# Patient Record
Sex: Female | Born: 1960 | Race: White | Hispanic: No | Smoking: Current every day smoker
Health system: Southern US, Community
[De-identification: ages and names within clinical notes are randomized; demographics above are authoritative.]

## PROBLEM LIST (undated history)

## (undated) DIAGNOSIS — I499 Cardiac arrhythmia, unspecified: Secondary | ICD-10-CM

## (undated) DIAGNOSIS — F419 Anxiety disorder, unspecified: Secondary | ICD-10-CM

## (undated) DIAGNOSIS — Z972 Presence of dental prosthetic device (complete) (partial): Secondary | ICD-10-CM

## (undated) DIAGNOSIS — R519 Headache, unspecified: Secondary | ICD-10-CM

## (undated) DIAGNOSIS — E119 Type 2 diabetes mellitus without complications: Secondary | ICD-10-CM

## (undated) DIAGNOSIS — J45909 Unspecified asthma, uncomplicated: Secondary | ICD-10-CM

## (undated) DIAGNOSIS — R0600 Dyspnea, unspecified: Secondary | ICD-10-CM

## (undated) DIAGNOSIS — G709 Myoneural disorder, unspecified: Secondary | ICD-10-CM

## (undated) DIAGNOSIS — H7192 Unspecified cholesteatoma, left ear: Secondary | ICD-10-CM

## (undated) DIAGNOSIS — G250 Essential tremor: Secondary | ICD-10-CM

## (undated) DIAGNOSIS — J449 Chronic obstructive pulmonary disease, unspecified: Secondary | ICD-10-CM

## (undated) DIAGNOSIS — M1990 Unspecified osteoarthritis, unspecified site: Secondary | ICD-10-CM

## (undated) DIAGNOSIS — R05 Cough: Secondary | ICD-10-CM

## (undated) DIAGNOSIS — I5189 Other ill-defined heart diseases: Secondary | ICD-10-CM

## (undated) DIAGNOSIS — E785 Hyperlipidemia, unspecified: Secondary | ICD-10-CM

## (undated) DIAGNOSIS — K219 Gastro-esophageal reflux disease without esophagitis: Secondary | ICD-10-CM

## (undated) DIAGNOSIS — H9190 Unspecified hearing loss, unspecified ear: Secondary | ICD-10-CM

## (undated) DIAGNOSIS — F329 Major depressive disorder, single episode, unspecified: Secondary | ICD-10-CM

## (undated) DIAGNOSIS — I10 Essential (primary) hypertension: Secondary | ICD-10-CM

## (undated) DIAGNOSIS — G243 Spasmodic torticollis: Secondary | ICD-10-CM

## (undated) DIAGNOSIS — R059 Cough, unspecified: Secondary | ICD-10-CM

## (undated) DIAGNOSIS — F32A Depression, unspecified: Secondary | ICD-10-CM

## (undated) DIAGNOSIS — T753XXA Motion sickness, initial encounter: Secondary | ICD-10-CM

## (undated) DIAGNOSIS — R51 Headache: Secondary | ICD-10-CM

## (undated) HISTORY — PX: EXTERNAL EAR SURGERY: SHX627

## (undated) HISTORY — DX: Anxiety disorder, unspecified: F41.9

## (undated) HISTORY — PX: CARPAL TUNNEL RELEASE: SHX101

## (undated) HISTORY — DX: Depression, unspecified: F32.A

## (undated) HISTORY — PX: COLONOSCOPY: SHX174

## (undated) HISTORY — DX: Hyperlipidemia, unspecified: E78.5

## (undated) HISTORY — DX: Essential (primary) hypertension: I10

## (undated) HISTORY — DX: Major depressive disorder, single episode, unspecified: F32.9

## (undated) HISTORY — DX: Unspecified asthma, uncomplicated: J45.909

## (undated) HISTORY — DX: Unspecified cholesteatoma, left ear: H71.92

## (undated) HISTORY — DX: Type 2 diabetes mellitus without complications: E11.9

## (undated) HISTORY — DX: Other ill-defined heart diseases: I51.89

## (undated) HISTORY — PX: DILATION AND CURETTAGE OF UTERUS: SHX78

## (undated) HISTORY — PX: TUBAL LIGATION: SHX77

## (undated) HISTORY — PX: SPINE SURGERY: SHX786

---

## 2004-01-07 ENCOUNTER — Emergency Department: Payer: Self-pay | Admitting: Emergency Medicine

## 2007-08-19 ENCOUNTER — Ambulatory Visit: Payer: Self-pay | Admitting: Gastroenterology

## 2007-08-26 ENCOUNTER — Ambulatory Visit: Payer: Self-pay | Admitting: Gastroenterology

## 2007-09-15 ENCOUNTER — Ambulatory Visit: Payer: Self-pay | Admitting: Specialist

## 2008-03-31 ENCOUNTER — Emergency Department: Payer: Self-pay | Admitting: Emergency Medicine

## 2008-04-04 ENCOUNTER — Emergency Department: Payer: Self-pay | Admitting: Emergency Medicine

## 2008-12-25 ENCOUNTER — Emergency Department: Payer: Self-pay | Admitting: Emergency Medicine

## 2009-03-01 ENCOUNTER — Emergency Department: Payer: Self-pay | Admitting: Emergency Medicine

## 2009-05-06 ENCOUNTER — Ambulatory Visit: Payer: Self-pay | Admitting: Rheumatology

## 2009-08-08 ENCOUNTER — Encounter: Admission: RE | Admit: 2009-08-08 | Discharge: 2009-08-08 | Payer: Self-pay | Admitting: Neurosurgery

## 2010-07-27 ENCOUNTER — Encounter: Payer: Self-pay | Admitting: Anesthesiology

## 2011-05-25 ENCOUNTER — Ambulatory Visit: Payer: Self-pay | Admitting: Neurology

## 2011-06-04 ENCOUNTER — Emergency Department: Payer: Self-pay | Admitting: Emergency Medicine

## 2011-07-12 ENCOUNTER — Ambulatory Visit: Payer: Self-pay | Admitting: Family Medicine

## 2011-10-12 ENCOUNTER — Emergency Department: Payer: Self-pay | Admitting: Unknown Physician Specialty

## 2011-10-20 ENCOUNTER — Emergency Department: Payer: Self-pay | Admitting: Emergency Medicine

## 2011-10-20 LAB — URIC ACID: Uric Acid: 4.7 mg/dL (ref 2.6–6.0)

## 2011-11-01 ENCOUNTER — Ambulatory Visit: Payer: Self-pay

## 2011-11-15 ENCOUNTER — Other Ambulatory Visit: Payer: Self-pay | Admitting: Rheumatology

## 2011-11-15 LAB — SYNOVIAL CELL COUNT + DIFF, W/ CRYSTALS
Basophil: 0 %
Crystals, Joint Fluid: NONE SEEN
Eosinophil: 0 %
Lymphocytes: 61 %
Neutrophils: 23 %
Nucleated Cell Count: 648 /mm3
Other Cells BF: 0 %
Other Mononuclear Cells: 16 %

## 2011-12-05 ENCOUNTER — Ambulatory Visit: Payer: Self-pay | Admitting: Rheumatology

## 2012-02-05 ENCOUNTER — Emergency Department: Payer: Self-pay | Admitting: Emergency Medicine

## 2012-05-22 ENCOUNTER — Ambulatory Visit: Payer: Self-pay | Admitting: Family Medicine

## 2012-06-30 ENCOUNTER — Ambulatory Visit: Payer: Self-pay | Admitting: Family Medicine

## 2012-07-01 ENCOUNTER — Inpatient Hospital Stay: Payer: Self-pay | Admitting: Specialist

## 2012-07-01 LAB — CBC WITH DIFFERENTIAL/PLATELET
Basophil #: 0 10*3/uL (ref 0.0–0.1)
Basophil %: 0.4 %
Eosinophil #: 0.1 10*3/uL (ref 0.0–0.7)
Eosinophil %: 1.6 %
HCT: 31.4 % — ABNORMAL LOW (ref 35.0–47.0)
HGB: 11.3 g/dL — ABNORMAL LOW (ref 12.0–16.0)
Lymphocyte #: 1.1 10*3/uL (ref 1.0–3.6)
Lymphocyte %: 18.9 %
MCH: 32 pg (ref 26.0–34.0)
MCHC: 35.9 g/dL (ref 32.0–36.0)
MCV: 89 fL (ref 80–100)
Monocyte #: 0.4 x10 3/mm (ref 0.2–0.9)
Monocyte %: 7 %
Neutrophil #: 4.3 10*3/uL (ref 1.4–6.5)
Neutrophil %: 72.1 %
Platelet: 290 10*3/uL (ref 150–440)
RBC: 3.52 10*6/uL — ABNORMAL LOW (ref 3.80–5.20)
RDW: 12.9 % (ref 11.5–14.5)
WBC: 5.9 10*3/uL (ref 3.6–11.0)

## 2012-07-01 LAB — COMPREHENSIVE METABOLIC PANEL
Albumin: 2.9 g/dL — ABNORMAL LOW (ref 3.4–5.0)
Alkaline Phosphatase: 82 U/L (ref 50–136)
Anion Gap: 6 — ABNORMAL LOW (ref 7–16)
BUN: 7 mg/dL (ref 7–18)
Bilirubin,Total: 0.2 mg/dL (ref 0.2–1.0)
Calcium, Total: 8.9 mg/dL (ref 8.5–10.1)
Chloride: 86 mmol/L — ABNORMAL LOW (ref 98–107)
Co2: 32 mmol/L (ref 21–32)
Creatinine: 0.65 mg/dL (ref 0.60–1.30)
EGFR (African American): >60
EGFR (Non-African Amer.): >60
Glucose: 177 mg/dL — ABNORMAL HIGH (ref 65–99)
Osmolality: 252 (ref 275–301)
Potassium: 3.9 mmol/L (ref 3.5–5.1)
SGOT(AST): 20 U/L (ref 15–37)
SGPT (ALT): 13 U/L (ref 12–78)
Sodium: 124 mmol/L — ABNORMAL LOW (ref 136–145)
Total Protein: 7.2 g/dL (ref 6.4–8.2)

## 2012-07-01 LAB — URIC ACID: Uric Acid: 4.8 mg/dL (ref 2.6–6.0)

## 2012-07-01 LAB — SEDIMENTATION RATE: Erythrocyte Sed Rate: 135 mm/hr — ABNORMAL HIGH (ref 0–30)

## 2012-07-02 LAB — BASIC METABOLIC PANEL
Anion Gap: 7 (ref 7–16)
BUN: 6 mg/dL — ABNORMAL LOW (ref 7–18)
Calcium, Total: 8.3 mg/dL — ABNORMAL LOW (ref 8.5–10.1)
Chloride: 89 mmol/L — ABNORMAL LOW (ref 98–107)
Co2: 31 mmol/L (ref 21–32)
Creatinine: 0.68 mg/dL (ref 0.60–1.30)
EGFR (African American): >60
EGFR (Non-African Amer.): >60
Glucose: 128 mg/dL — ABNORMAL HIGH (ref 65–99)
Osmolality: 254 (ref 275–301)
Potassium: 3.3 mmol/L — ABNORMAL LOW (ref 3.5–5.1)
Sodium: 127 mmol/L — ABNORMAL LOW (ref 136–145)

## 2012-07-02 LAB — CBC WITH DIFFERENTIAL/PLATELET
Basophil #: 0 10*3/uL (ref 0.0–0.1)
Basophil %: 0.4 %
Eosinophil #: 0.1 10*3/uL (ref 0.0–0.7)
Eosinophil %: 2.6 %
HCT: 28.9 % — ABNORMAL LOW (ref 35.0–47.0)
HGB: 10.4 g/dL — ABNORMAL LOW (ref 12.0–16.0)
Lymphocyte #: 1.1 10*3/uL (ref 1.0–3.6)
Lymphocyte %: 27.8 %
MCH: 31.9 pg (ref 26.0–34.0)
MCHC: 36.1 g/dL — ABNORMAL HIGH (ref 32.0–36.0)
MCV: 88 fL (ref 80–100)
Monocyte #: 0.4 x10 3/mm (ref 0.2–0.9)
Monocyte %: 10.7 %
Neutrophil #: 2.4 10*3/uL (ref 1.4–6.5)
Neutrophil %: 58.5 %
Platelet: 283 10*3/uL (ref 150–440)
RBC: 3.26 10*6/uL — ABNORMAL LOW (ref 3.80–5.20)
RDW: 12.7 % (ref 11.5–14.5)
WBC: 4.1 10*3/uL (ref 3.6–11.0)

## 2012-07-02 LAB — SODIUM, URINE, RANDOM: Sodium, Urine Random: 8 mmol/L (ref 20–110)

## 2012-07-02 LAB — HEMOGLOBIN A1C: Hemoglobin A1C: 7.1 % — ABNORMAL HIGH (ref 4.2–6.3)

## 2012-07-03 LAB — CBC WITH DIFFERENTIAL/PLATELET
Basophil #: 0 10*3/uL (ref 0.0–0.1)
Basophil %: 0.3 %
Eosinophil #: 0 10*3/uL (ref 0.0–0.7)
Eosinophil %: 0.1 %
HCT: 29.5 % — ABNORMAL LOW (ref 35.0–47.0)
HGB: 10.3 g/dL — ABNORMAL LOW (ref 12.0–16.0)
Lymphocyte #: 0.8 10*3/uL — ABNORMAL LOW (ref 1.0–3.6)
Lymphocyte %: 14.6 %
MCH: 31.3 pg (ref 26.0–34.0)
MCHC: 35 g/dL (ref 32.0–36.0)
MCV: 90 fL (ref 80–100)
Monocyte #: 0.3 x10 3/mm (ref 0.2–0.9)
Monocyte %: 5.9 %
Neutrophil #: 4.3 10*3/uL (ref 1.4–6.5)
Neutrophil %: 79.1 %
Platelet: 319 10*3/uL (ref 150–440)
RBC: 3.3 10*6/uL — ABNORMAL LOW (ref 3.80–5.20)
RDW: 13 % (ref 11.5–14.5)
WBC: 5.5 10*3/uL (ref 3.6–11.0)

## 2012-07-03 LAB — BASIC METABOLIC PANEL
Anion Gap: 4 — ABNORMAL LOW (ref 7–16)
BUN: 6 mg/dL — ABNORMAL LOW (ref 7–18)
Calcium, Total: 8.5 mg/dL (ref 8.5–10.1)
Chloride: 97 mmol/L — ABNORMAL LOW (ref 98–107)
Co2: 29 mmol/L (ref 21–32)
Creatinine: 0.53 mg/dL — ABNORMAL LOW (ref 0.60–1.30)
EGFR (African American): >60
EGFR (Non-African Amer.): >60
Glucose: 150 mg/dL — ABNORMAL HIGH (ref 65–99)
Osmolality: 261 (ref 275–301)
Potassium: 4.1 mmol/L (ref 3.5–5.1)
Sodium: 130 mmol/L — ABNORMAL LOW (ref 136–145)

## 2012-07-04 LAB — BASIC METABOLIC PANEL
Anion Gap: 6 — ABNORMAL LOW (ref 7–16)
BUN: 9 mg/dL (ref 7–18)
Calcium, Total: 8.5 mg/dL (ref 8.5–10.1)
Chloride: 95 mmol/L — ABNORMAL LOW (ref 98–107)
Co2: 29 mmol/L (ref 21–32)
Creatinine: 0.7 mg/dL (ref 0.60–1.30)
EGFR (African American): >60
EGFR (Non-African Amer.): >60
Glucose: 100 mg/dL — ABNORMAL HIGH (ref 65–99)
Osmolality: 260 (ref 275–301)
Potassium: 3.6 mmol/L (ref 3.5–5.1)
Sodium: 130 mmol/L — ABNORMAL LOW (ref 136–145)

## 2012-07-07 LAB — CULTURE, BLOOD (SINGLE)

## 2012-07-17 ENCOUNTER — Ambulatory Visit: Payer: Self-pay | Admitting: Specialist

## 2012-08-04 ENCOUNTER — Emergency Department: Payer: Self-pay | Admitting: Emergency Medicine

## 2012-08-04 LAB — COMPREHENSIVE METABOLIC PANEL
Albumin: 3.6 g/dL (ref 3.4–5.0)
Alkaline Phosphatase: 95 U/L (ref 50–136)
Anion Gap: 7 (ref 7–16)
BUN: 6 mg/dL — ABNORMAL LOW (ref 7–18)
Bilirubin,Total: 0.3 mg/dL (ref 0.2–1.0)
Calcium, Total: 9 mg/dL (ref 8.5–10.1)
Chloride: 96 mmol/L — ABNORMAL LOW (ref 98–107)
Co2: 27 mmol/L (ref 21–32)
Creatinine: 0.84 mg/dL (ref 0.60–1.30)
EGFR (African American): >60
EGFR (Non-African Amer.): >60
Glucose: 187 mg/dL — ABNORMAL HIGH (ref 65–99)
Osmolality: 263 (ref 275–301)
Potassium: 3.5 mmol/L (ref 3.5–5.1)
SGOT(AST): 11 U/L — ABNORMAL LOW (ref 15–37)
SGPT (ALT): 14 U/L (ref 12–78)
Sodium: 130 mmol/L — ABNORMAL LOW (ref 136–145)
Total Protein: 7.4 g/dL (ref 6.4–8.2)

## 2012-08-04 LAB — CBC
HCT: 37.6 % (ref 35.0–47.0)
HGB: 13.1 g/dL (ref 12.0–16.0)
MCH: 32.2 pg (ref 26.0–34.0)
MCHC: 34.7 g/dL (ref 32.0–36.0)
MCV: 93 fL (ref 80–100)
Platelet: 257 10*3/uL (ref 150–440)
RBC: 4.06 10*6/uL (ref 3.80–5.20)
RDW: 14.2 % (ref 11.5–14.5)
WBC: 9.5 10*3/uL (ref 3.6–11.0)

## 2012-08-09 ENCOUNTER — Emergency Department: Payer: Self-pay | Admitting: Emergency Medicine

## 2012-08-17 ENCOUNTER — Emergency Department: Payer: Self-pay | Admitting: Emergency Medicine

## 2012-09-10 ENCOUNTER — Ambulatory Visit: Payer: Self-pay | Admitting: Family Medicine

## 2012-09-30 ENCOUNTER — Emergency Department: Payer: Self-pay | Admitting: Emergency Medicine

## 2012-09-30 LAB — COMPREHENSIVE METABOLIC PANEL
Albumin: 2.7 g/dL — ABNORMAL LOW (ref 3.4–5.0)
Alkaline Phosphatase: 89 U/L (ref 50–136)
Anion Gap: 8 (ref 7–16)
BUN: 4 mg/dL — ABNORMAL LOW (ref 7–18)
Bilirubin,Total: 0.3 mg/dL (ref 0.2–1.0)
Calcium, Total: 8.9 mg/dL (ref 8.5–10.1)
Chloride: 92 mmol/L — ABNORMAL LOW (ref 98–107)
Co2: 28 mmol/L (ref 21–32)
Creatinine: 0.69 mg/dL (ref 0.60–1.30)
EGFR (African American): >60
EGFR (Non-African Amer.): >60
Glucose: 163 mg/dL — ABNORMAL HIGH (ref 65–99)
Osmolality: 258 (ref 275–301)
Potassium: 4 mmol/L (ref 3.5–5.1)
SGOT(AST): 14 U/L — ABNORMAL LOW (ref 15–37)
SGPT (ALT): 8 U/L — ABNORMAL LOW (ref 12–78)
Sodium: 128 mmol/L — ABNORMAL LOW (ref 136–145)
Total Protein: 6.8 g/dL (ref 6.4–8.2)

## 2012-09-30 LAB — CBC WITH DIFFERENTIAL/PLATELET
Basophil #: 0 x10 3/mm 3
Basophil %: 0.3 %
Eosinophil #: 0 x10 3/mm 3
Eosinophil %: 0.4 %
HCT: 32.6 % — ABNORMAL LOW
HGB: 11.6 g/dL — ABNORMAL LOW
Lymphocyte %: 19.7 %
Lymphs Abs: 1.5 x10 3/mm 3
MCH: 31.8 pg
MCHC: 35.5 g/dL
MCV: 90 fL
Monocyte #: 0.5 "x10 3/mm "
Monocyte %: 6 %
Neutrophil #: 5.8 x10 3/mm 3
Neutrophil %: 73.6 %
Platelet: 336 x10 3/mm 3
RBC: 3.64 X10 6/mm 3 — ABNORMAL LOW
RDW: 13.5 %
WBC: 7.9 x10 3/mm 3

## 2012-09-30 LAB — URIC ACID: Uric Acid: 4.2 mg/dL

## 2012-10-03 ENCOUNTER — Emergency Department: Payer: Self-pay | Admitting: Emergency Medicine

## 2012-10-07 LAB — CBC
HCT: 36.2 % (ref 35.0–47.0)
HGB: 12.7 g/dL (ref 12.0–16.0)
MCH: 31.2 pg (ref 26.0–34.0)
MCHC: 35.2 g/dL (ref 32.0–36.0)
MCV: 89 fL (ref 80–100)
Platelet: 507 10*3/uL — ABNORMAL HIGH (ref 150–440)
RBC: 4.08 10*6/uL (ref 3.80–5.20)
RDW: 13.7 % (ref 11.5–14.5)
WBC: 10.1 10*3/uL (ref 3.6–11.0)

## 2012-10-07 LAB — COMPREHENSIVE METABOLIC PANEL
Albumin: 2.9 g/dL — ABNORMAL LOW (ref 3.4–5.0)
Alkaline Phosphatase: 106 U/L (ref 50–136)
Anion Gap: 6 — ABNORMAL LOW (ref 7–16)
BUN: 6 mg/dL — ABNORMAL LOW (ref 7–18)
Bilirubin,Total: 0.3 mg/dL (ref 0.2–1.0)
Calcium, Total: 9.1 mg/dL (ref 8.5–10.1)
Chloride: 96 mmol/L — ABNORMAL LOW (ref 98–107)
Co2: 32 mmol/L (ref 21–32)
Creatinine: 0.58 mg/dL — ABNORMAL LOW (ref 0.60–1.30)
EGFR (African American): >60
EGFR (Non-African Amer.): >60
Glucose: 131 mg/dL — ABNORMAL HIGH (ref 65–99)
Osmolality: 268 (ref 275–301)
Potassium: 3.6 mmol/L (ref 3.5–5.1)
SGOT(AST): 16 U/L (ref 15–37)
SGPT (ALT): 14 U/L (ref 12–78)
Sodium: 134 mmol/L — ABNORMAL LOW (ref 136–145)
Total Protein: 7.1 g/dL (ref 6.4–8.2)

## 2012-10-07 LAB — TROPONIN I: Troponin-I: <0.02 ng/mL

## 2012-10-08 ENCOUNTER — Inpatient Hospital Stay: Payer: Self-pay | Admitting: Internal Medicine

## 2012-10-08 LAB — URINALYSIS, COMPLETE
Bacteria: NONE SEEN
Bilirubin,UR: NEGATIVE
Glucose,UR: NEGATIVE mg/dL (ref 0–75)
Ketone: NEGATIVE
Leukocyte Esterase: NEGATIVE
Nitrite: NEGATIVE
Ph: 9 (ref 4.5–8.0)
Protein: 30
RBC,UR: 10 /HPF (ref 0–5)
Specific Gravity: 1.013 (ref 1.003–1.030)
Squamous Epithelial: 4
WBC UR: 1 /HPF (ref 0–5)

## 2012-10-09 LAB — LIPID PANEL
Cholesterol: 144 mg/dL (ref 0–200)
HDL Cholesterol: 25 mg/dL — ABNORMAL LOW (ref 40–60)
Ldl Cholesterol, Calc: 85 mg/dL (ref 0–100)
Triglycerides: 172 mg/dL (ref 0–200)
VLDL Cholesterol, Calc: 34 mg/dL (ref 5–40)

## 2012-10-09 LAB — BASIC METABOLIC PANEL
Anion Gap: 9 (ref 7–16)
BUN: 6 mg/dL — ABNORMAL LOW (ref 7–18)
Calcium, Total: 8.6 mg/dL (ref 8.5–10.1)
Chloride: 97 mmol/L — ABNORMAL LOW (ref 98–107)
Co2: 27 mmol/L (ref 21–32)
Creatinine: 0.6 mg/dL (ref 0.60–1.30)
EGFR (African American): >60
EGFR (Non-African Amer.): >60
Glucose: 106 mg/dL — ABNORMAL HIGH (ref 65–99)
Osmolality: 264 (ref 275–301)
Potassium: 3.3 mmol/L — ABNORMAL LOW (ref 3.5–5.1)
Sodium: 133 mmol/L — ABNORMAL LOW (ref 136–145)

## 2012-10-09 LAB — CBC WITH DIFFERENTIAL/PLATELET
Basophil #: 0 10*3/uL (ref 0.0–0.1)
Basophil %: 0.2 %
Eosinophil #: 0.2 10*3/uL (ref 0.0–0.7)
Eosinophil %: 2.5 %
HCT: 32.4 % — ABNORMAL LOW (ref 35.0–47.0)
HGB: 11.5 g/dL — ABNORMAL LOW (ref 12.0–16.0)
Lymphocyte #: 2 10*3/uL (ref 1.0–3.6)
Lymphocyte %: 27.4 %
MCH: 31.3 pg (ref 26.0–34.0)
MCHC: 35.5 g/dL (ref 32.0–36.0)
MCV: 88 fL (ref 80–100)
Monocyte #: 0.5 x10 3/mm (ref 0.2–0.9)
Monocyte %: 6.3 %
Neutrophil #: 4.6 10*3/uL (ref 1.4–6.5)
Neutrophil %: 63.6 %
Platelet: 366 10*3/uL (ref 150–440)
RBC: 3.68 10*6/uL — ABNORMAL LOW (ref 3.80–5.20)
RDW: 13.8 % (ref 11.5–14.5)
WBC: 7.2 10*3/uL (ref 3.6–11.0)

## 2012-10-09 LAB — TSH: Thyroid Stimulating Horm: 1.9 u[IU]/mL

## 2012-10-09 LAB — HEMOGLOBIN A1C: Hemoglobin A1C: 7.4 % — ABNORMAL HIGH (ref 4.2–6.3)

## 2012-10-10 LAB — BETA STREP CULTURE(ARMC)

## 2012-10-10 LAB — BASIC METABOLIC PANEL
Anion Gap: 9 (ref 7–16)
BUN: 7 mg/dL (ref 7–18)
Calcium, Total: 9 mg/dL (ref 8.5–10.1)
Chloride: 96 mmol/L — ABNORMAL LOW (ref 98–107)
Co2: 26 mmol/L (ref 21–32)
Creatinine: 0.61 mg/dL (ref 0.60–1.30)
EGFR (African American): >60
EGFR (Non-African Amer.): >60
Glucose: 162 mg/dL — ABNORMAL HIGH (ref 65–99)
Osmolality: 264 (ref 275–301)
Potassium: 3.8 mmol/L (ref 3.5–5.1)
Sodium: 131 mmol/L — ABNORMAL LOW (ref 136–145)

## 2012-10-30 ENCOUNTER — Ambulatory Visit: Payer: Self-pay | Admitting: Family Medicine

## 2012-11-05 ENCOUNTER — Ambulatory Visit: Payer: Self-pay | Admitting: Specialist

## 2012-11-06 LAB — PRO B NATRIURETIC PEPTIDE: B-Type Natriuretic Peptide: 31 pg/mL (ref 0–125)

## 2012-11-06 LAB — BASIC METABOLIC PANEL
Anion Gap: 10 (ref 7–16)
BUN: 4 mg/dL — ABNORMAL LOW (ref 7–18)
Calcium, Total: 8.2 mg/dL — ABNORMAL LOW (ref 8.5–10.1)
Chloride: 93 mmol/L — ABNORMAL LOW (ref 98–107)
Co2: 24 mmol/L (ref 21–32)
Creatinine: 0.94 mg/dL (ref 0.60–1.30)
EGFR (African American): >60
EGFR (Non-African Amer.): >60
Glucose: 215 mg/dL — ABNORMAL HIGH (ref 65–99)
Osmolality: 259 (ref 275–301)
Potassium: 3.8 mmol/L (ref 3.5–5.1)
Sodium: 127 mmol/L — ABNORMAL LOW (ref 136–145)

## 2012-11-06 LAB — CBC
HCT: 33.5 % — ABNORMAL LOW (ref 35.0–47.0)
HGB: 11.3 g/dL — ABNORMAL LOW (ref 12.0–16.0)
MCH: 30.4 pg (ref 26.0–34.0)
MCHC: 33.8 g/dL (ref 32.0–36.0)
MCV: 90 fL (ref 80–100)
Platelet: 319 10*3/uL (ref 150–440)
RBC: 3.73 10*6/uL — ABNORMAL LOW (ref 3.80–5.20)
RDW: 13.9 % (ref 11.5–14.5)
WBC: 9 10*3/uL (ref 3.6–11.0)

## 2012-11-07 ENCOUNTER — Inpatient Hospital Stay: Payer: Self-pay | Admitting: Internal Medicine

## 2012-11-07 LAB — URINALYSIS, COMPLETE
Bacteria: NONE SEEN
Bilirubin,UR: NEGATIVE
Glucose,UR: NEGATIVE mg/dL (ref 0–75)
Ketone: NEGATIVE
Nitrite: POSITIVE
Ph: 7 (ref 4.5–8.0)
Protein: NEGATIVE
RBC,UR: 6 /HPF (ref 0–5)
Specific Gravity: 1.005 (ref 1.003–1.030)
Squamous Epithelial: 1
WBC UR: 58 /HPF (ref 0–5)

## 2012-11-08 LAB — BASIC METABOLIC PANEL
Anion Gap: 3 — ABNORMAL LOW (ref 7–16)
BUN: 6 mg/dL — ABNORMAL LOW (ref 7–18)
Calcium, Total: 8.3 mg/dL — ABNORMAL LOW (ref 8.5–10.1)
Chloride: 100 mmol/L (ref 98–107)
Co2: 30 mmol/L (ref 21–32)
Creatinine: 0.71 mg/dL (ref 0.60–1.30)
EGFR (African American): >60
EGFR (Non-African Amer.): >60
Glucose: 90 mg/dL (ref 65–99)
Osmolality: 264 (ref 275–301)
Potassium: 3.9 mmol/L (ref 3.5–5.1)
Sodium: 133 mmol/L — ABNORMAL LOW (ref 136–145)

## 2012-11-08 LAB — CBC WITH DIFFERENTIAL/PLATELET
Basophil #: 0 10*3/uL (ref 0.0–0.1)
Basophil %: 0.8 %
Eosinophil #: 0.1 10*3/uL (ref 0.0–0.7)
Eosinophil %: 3.8 %
HCT: 30.4 % — ABNORMAL LOW (ref 35.0–47.0)
HGB: 10.4 g/dL — ABNORMAL LOW (ref 12.0–16.0)
Lymphocyte #: 0.9 10*3/uL — ABNORMAL LOW (ref 1.0–3.6)
Lymphocyte %: 29.7 %
MCH: 30.7 pg (ref 26.0–34.0)
MCHC: 34.1 g/dL (ref 32.0–36.0)
MCV: 90 fL (ref 80–100)
Monocyte #: 0.3 x10 3/mm (ref 0.2–0.9)
Monocyte %: 8 %
Neutrophil #: 1.8 10*3/uL (ref 1.4–6.5)
Neutrophil %: 57.7 %
Platelet: 226 10*3/uL (ref 150–440)
RBC: 3.38 10*6/uL — ABNORMAL LOW (ref 3.80–5.20)
RDW: 14.4 % (ref 11.5–14.5)
WBC: 3.2 10*3/uL — ABNORMAL LOW (ref 3.6–11.0)

## 2012-11-08 LAB — VANCOMYCIN, TROUGH: Vancomycin, Trough: 9 ug/mL — ABNORMAL LOW (ref 10–20)

## 2012-11-13 DIAGNOSIS — G8929 Other chronic pain: Secondary | ICD-10-CM | POA: Insufficient documentation

## 2012-11-16 DIAGNOSIS — I5032 Chronic diastolic (congestive) heart failure: Secondary | ICD-10-CM | POA: Insufficient documentation

## 2012-12-21 ENCOUNTER — Emergency Department: Payer: Self-pay | Admitting: Emergency Medicine

## 2012-12-30 ENCOUNTER — Ambulatory Visit: Payer: Self-pay | Admitting: Orthopedic Surgery

## 2013-02-23 ENCOUNTER — Ambulatory Visit: Payer: Self-pay | Admitting: Family Medicine

## 2013-04-09 ENCOUNTER — Ambulatory Visit: Payer: Self-pay | Admitting: Urgent Care

## 2013-10-02 DIAGNOSIS — G250 Essential tremor: Secondary | ICD-10-CM | POA: Insufficient documentation

## 2013-10-02 DIAGNOSIS — G243 Spasmodic torticollis: Secondary | ICD-10-CM | POA: Insufficient documentation

## 2013-10-20 ENCOUNTER — Ambulatory Visit: Payer: Self-pay | Admitting: Neurology

## 2013-11-16 ENCOUNTER — Inpatient Hospital Stay: Payer: Self-pay | Admitting: Internal Medicine

## 2013-11-16 LAB — BASIC METABOLIC PANEL
Anion Gap: 10 (ref 7–16)
BUN: 7 mg/dL (ref 7–18)
Calcium, Total: 7.9 mg/dL — ABNORMAL LOW (ref 8.5–10.1)
Chloride: 95 mmol/L — ABNORMAL LOW (ref 98–107)
Co2: 26 mmol/L (ref 21–32)
Creatinine: 0.78 mg/dL (ref 0.60–1.30)
EGFR (African American): >60
EGFR (Non-African Amer.): >60
Glucose: 277 mg/dL — ABNORMAL HIGH (ref 65–99)
Osmolality: 271 (ref 275–301)
Potassium: 4 mmol/L (ref 3.5–5.1)
Sodium: 131 mmol/L — ABNORMAL LOW (ref 136–145)

## 2013-11-16 LAB — CBC WITH DIFFERENTIAL/PLATELET
Basophil #: 0.1 10*3/uL (ref 0.0–0.1)
Basophil %: 0.5 %
Eosinophil #: 0.1 10*3/uL (ref 0.0–0.7)
Eosinophil %: 0.9 %
HCT: 40.9 % (ref 35.0–47.0)
HGB: 13.7 g/dL (ref 12.0–16.0)
Lymphocyte #: 2.7 10*3/uL (ref 1.0–3.6)
Lymphocyte %: 24.4 %
MCH: 31.4 pg (ref 26.0–34.0)
MCHC: 33.6 g/dL (ref 32.0–36.0)
MCV: 94 fL (ref 80–100)
Monocyte #: 0.6 x10 3/mm (ref 0.2–0.9)
Monocyte %: 5.5 %
Neutrophil #: 7.5 10*3/uL — ABNORMAL HIGH (ref 1.4–6.5)
Neutrophil %: 68.7 %
Platelet: 254 10*3/uL (ref 150–440)
RBC: 4.38 10*6/uL (ref 3.80–5.20)
RDW: 14 % (ref 11.5–14.5)
WBC: 11 10*3/uL (ref 3.6–11.0)

## 2013-11-21 LAB — CULTURE, BLOOD (SINGLE)

## 2014-06-11 NOTE — Op Note (Signed)
PATIENT NAME:  Tricia [name], Tricia [name] MR#:  [id number] DATE OF BIRTH:  [birth year]  DATE OF PROCEDURE:  07/03/2012  PREOPERATIVE DIAGNOSIS: Osteomyelitis left great toe.   POSTOPERATIVE DIAGNOSIS: Osteomyelitis left great toe.   PROCEDURE: Excision of infected bone soft tissue from the left great toe.   SURGEON: Albertine Patricia, DPM   ASSISTANT: None.   HISTORY OF PRESENT ILLNESS: The patient presented to the hospital and was admitted because of redness and cellulitis to the left great toe which was spreading on the dorsum of the foot and proximally from the great toe. Ulcer was present at the IPJ level of the great toe.  Subsequent MRI showed an accessory bone area that was likely involved with infection with the possibility of the IP joint being involved as well.   ANESTHESIA: IVA with local.   ESTIMATED BLOOD LOSS: Negligible.   HEMOSTASIS: None.   DESCRIPTION OF PROCEDURE: The patient was brought to the OR and placed on the OR table in the supine position. At this point after sedation and local anesthetic was achieved, the patient was prepped and draped in the usual sterile manner. Attention was directed to the plantar aspect of the toe where an 8 mm ulcer was noted. This was probed down to deeper tissue this time, and incision was made both distally and proximally to the ulceration. This was deepened down to the deep tendinous area. The long flexor tendon was identified and divided centrally. There was an accessory bone noted at the IP joint level. This was dissected from the tendon and removed, and this was also cultured and sent to pathology for evaluation. At this point, the proximal and distal phalanges were inspected. The bone seemed to be intact. Bone and cartilage seemed to be stable and showed no evidence of degenerative or infectious change at this time frame. The area was copiously irrigated. Stimulan rapid-set beads were utilized and laced with gentamicin. These were placed deep  to the tendon, and once 10 to 12 of those beads were placed in there, the tendon was repaired with 2-0 Vicryl simple interrupted sutures. More beads were placed in the wound at this time, and subcutaneous tissues and deep and superficial fascia layers were closed with 3-0 Vicryl simple interrupted sutures. Skin was then closed with 3-0 nylon simple interrupted sutures. At this time, the patient was dressed with a sterile compressive dressing consisting of Xeroform gauze, 4 x 4's, Kling and Kerlix. The patient tolerated the procedure and anesthesia well and left the OR to the recovery room with vital signs stable and neurovascular status intact.   ____________________________ Tricia [name], DPM mgt:cb D: 07/03/2012 15:13:52 ET T: 07/03/2012 16:09:59 ET JOB#: [id number]  cc: Tricia Heck Tricia [name], DPM, <Dictator> Perry Mount MD ELECTRONICALLY SIGNED 08/13/2012 15:22

## 2014-06-11 NOTE — Discharge Summary (Signed)
PATIENT NAME:  Tricia [name], Tricia [name] MR#:  [id number] DATE OF BIRTH:  [birth year]  DATE OF ADMISSION:  07/01/2012 DATE OF DISCHARGE:  07/04/2012  For a detailed note, please take a look at the history and physical done on admission by Dr. Laurin Coder.   DIAGNOSES AT DISCHARGE:  Are as follows:  1.  Acute left lower extremity cellulitis.  2.  History of gout. 3.  Diabetes. 4.  Chronic obstructive pulmonary disease.  5.  Hyperlipidemia. 6.  Anxiety/depression.  DIET:  The patient was discharged on a low sodium, low fat, American diabetic association diet.   ACTIVITY:  As tolerated.  FOLLOW-UP:  With Dr. Keith Rake in the next 1 to 2 weeks.   DISCHARGE MEDICATIONS:  Are as follows:  1.  Metformin 500 mg twice daily. 2.  Simvastatin 20 mg daily. 3.  Cymbalta 60 mg daily. 4.  Primidone 250 mg twice daily. 5.  Xanax 0.5 mg 3 times daily. 6.  Meloxicam 7.5 mg daily. 7.  DuoNebs q. 6 hours as needed. 8. Tylenol with hydrocodone 10/325 1 tab 4 times daily as needed. 9.  Singulair 10 mg daily. 10.  Spiriva 1 puff daily. 11.  Symbicort 2 puffs twice daily. 12.  Zantac 150 mg twice daily. 13.  Ventolin inhaler 2 puffs 4 times daily as needed. 14.  Maxalt 5 mg as needed for migraine. 15.  Lantus 20 units at bedtime. 16.  Theophylline 1 tab twice daily. 17.  Augmentin 1 tab twice daily x 7 days.  [place] COURSE:  Dr. Albertine Patricia from podiatry.  PERTINENT STUDIES DONE DURING THE HOSPITAL COURSE:  Are as follows:  An ultrasound of the left lower extremity showing no evidence of any deep venous thrombosis.  An MRI of the left lower extremity also done showing no fracture of the left foot.  Severe soft tissue swelling along the dorsal aspect of the left foot and surrounding the ankle which may be reactive versus cellulitis.  A small amount of fluid in the retrocalcaneal bursa can be seen with retrocalcaneal bursitis.  HOSPITAL COURSE:  This is a 54 year old female with  medical problems as mentioned as mentioned above presented to the hospital due to left lower extremity swelling and redness and noted to have suspected left lower extremity cellulitis. 1.  Left lower extremity redness and swelling.  This was suspected to be due to cellulitis.  The patient was started on broad spectrum IV antibiotics on daptomycin and Zosyn.  She had Dopplers of her lower extremities which were negative.  There was also some concern that she could have had a gout attack, although her uric acid was normal.  A podiatry consult was obtained to see if patient would have an underlying possible diabetic foot infection.  The patient was seen by podiatry and as per Dr. Elvina Mattes there was no evidence of any surgical indication.  He did order an MRI which showed no fracture, but some soft tissue swelling which was consistent with cellulitis.  The patient clinically improved with IV antibiotics and empiric prednisone taper.  Her white cell count has normalized.  She is afebrile.  Her blood cultures are negative.  She clinically feels much better and therefore is being discharged on 7 more days of by mouth Augmentin therapy for her cellulitis as mentioned.  2.  Diabetes.  [name] blood sugar has remained stable.  She will continue her metformin and Lantus upon discharge.  3.  COPD.  There was no evidence  of any COPD exacerbation.  The patient was maintained on her Symbicort and Spiriva.  She will resume that.   4.  Hyperlipidemia.  The patient was maintained on her simvastatin.  She will resume that.  5.  Anxiety/depression.  The patient was maintained on her Xanax and Cymbalta and she will resume that upon discharge too.    The patient is a FULL CODE.    Time spent on discharge is 40 minutes.      ____________________________ Belia Heman. Verdell Carmine, MD vjs:ea D: 07/04/2012 17:00:47 ET T: 07/05/2012 02:02:11 ET JOB#: [id number]  cc: Belia Heman. Verdell Carmine, MD, <Dictator> [facility] Myrla Halsted, MD Henreitta Leber MD ELECTRONICALLY SIGNED 07/08/2012 20:03

## 2014-06-11 NOTE — Consult Note (Signed)
See consult note.  Consult limited to question of EGD or not.  She is already starting to respond to treatment and able to take broth.  No dysphagia before she took the prednisone and got thrush.  Next time she needs prednisone for acute gout I advised her to get nystatin to take when the thrush starts up again.  If she does not respong appropriately to current course of meds then would want to check with EGD. will follow with you and hold off on EGD for now.  Electronic Signatures: Manya Silvas (MD)  (Signed on 20-Aug-14 18:23)  Authored  Last Updated: 20-Aug-14 18:23 by Manya Silvas (MD)

## 2014-06-11 NOTE — Consult Note (Signed)
PATIENT NAME:  Tricia [name], AMEDEE MR#:  [id number] DATE OF BIRTH:  08-26-1960  PODIATRY CONSULTATION  DATE OF CONSULTATION:  07/02/2012  REASON FOR CONSULTATION: Edema and pain, left lower extremity.   The patient is a 54 year old female admitted to the hospital because of swelling and pain to the left foot and ankle region, with noted swelling to the left lower extremity as a whole. She denied a history of injury. She has had history of gout in the past, and is also diabetic. Most recent episodes of gout were last year, when she had it off and on for a couple of weeks in different joints. At this point she went to bed one night and the morning she woke up and it was very, very painful and got worse she went to work. No history of injury is noted. No history of any open wounds or puncture wounds that she is aware of.   MEDICAL HISTORY: Is positive for diabetes type 2, history of gout, COPD, degenerative joint disease, fibromyalgia, chronic back pain, carpal tunnel syndrome, chronic headaches, osteoporosis.   PAST SURGICAL HISTORY: Includes: Carpal tunnel release, positive D and C x 2. She had an ear surgery to the left ear, and has had a BTO.  Her home meds include: Zantac 150 mg b.i.d., Ventolin 90 mcg 2 puffs 4 times a day, Symbicort inhaler, Spiriva inhaler, Singulair, simvastatin 20 mg twice a day, Primidone 250 mg daily, metformin 500 mg b.i.d., meloxicam 7.5 mg daily, Maxalt 5 mg daily, Lantus 20 units at bedtime, DuoNeb inhaler, Cymbalta 60 mg daily, alprazolam 0.5 mg 3 times a day and Tylenol with hydrocodone, 4 times a day as needed for pain.   Her vital signs today include a temperature of 98.5, pulse 95, respirations 20, blood pressure 105/64, pulse 95.   LOWER EXTREMITY EXAM:  VASCULAR: (Dictation Anomaly)DP and PT pulses are difficult to palpate in the left foot. They are +1/4 in the right foot.  DERMATOLOGIC: The patient has distal dry skin in between the intertriginous zones.  There is  really no skin break or wounds present at all. There is no erythema or rash associated with the region. Likely had a little maceration in those areas on admission, but it looks like it has dried up some today. There is no open wounds. No puncture wounds. No evidence of any type of skin breach whatsoever. There is noted swelling to the left mid-foot, rear foot, and ankle region. Also, this extends up into the lower leg to a certain extent.   D-dimer was positive, but a DVT ultrasound was negative.   ORTHO: The patient cannot move her foot or ankle around very much. I do not  see any type of collapse or significant structural deformity at this juncture.   X-rays reviewed. No evidence of fracture-dislocation at this point. There is some evidence of general degenerative change, evidence of soft tissue, but nothing overt on radiographs.   Her uric acid on lab work was pretty low at around 4, but if this is a gout attack she would probably have a low uric acid now if a lot of her uric acid is deposited tissues.   CLINICAL IMPRESSION: I think it is uncertain what the diagnosis is right now. I do not think that it is infectious at this point. I think it has more to do with either gout or possibly an early Charcot arthropathy. Oftentimes will see swelling to the foot when that happens and lot of inflammation, so  that is something we need to rule out as well.   TREATMENT PLAN: Right now I would probably try to get her started on some colchicine since prednisone would probably make her blood sugar go up quite a bit. We can to try colchicine as that would be somewhat diagnostic if it does really help her get better over the next day or two. I would also recommend an MRI just to make sure she does not have any evidence of bone edema or occult-type fractures to any areas on the mid-foot or rear foot, which is oftentimes the beginning of Charcot arthropathy. I would have her stay nonweightbearing on that foot  as well. I will see if I can get some orders in to that effect.    ____________________________ Gerrit Heck. Natividad Schlosser, DPM mgt:dm D: 07/02/2012 13:00:23 ET T: 07/02/2012 15:47:23 ET JOB#: [id number]  cc: Gerrit Heck Clete Kuch, DPM, <Dictator> Perry Mount MD ELECTRONICALLY SIGNED 08/13/2012 15:26

## 2014-06-11 NOTE — Discharge Summary (Signed)
PATIENT NAME:  Tricia [name], Tricia [name] MR#:  [id number] DATE OF BIRTH:  10-26-1960  DATE OF ADMISSION:  10/08/2012 DATE OF DISCHARGE:  10/10/2012  ADMITTING DIAGNOSES: 1.  Painful swallowing which was severe.  2.  Acute gout exacerbation.   DISCHARGE DIAGNOSES: 1.  Odynophagia, likely secondary to severe candida esophagitis. Now improved.  2.  Acute gout exacerbation.  3.  Fibromyalgia.  4.  Diabetes, insulin requiring.  5.  Osteoporosis.  6.  Hyperlipidemia.  7.  Anxiety and depression.  8.  Status post D and C.  9.  Status post carpal tunnel release x2 on the right wrist, once on the left wrist.   CONSULTANTS: Dr. Vira Agar.  PERTINENT LABS AND EVALUATIONS: Admitting glucose 131, BUN 6, creatinine 0.58, sodium 134, potassium 3.6, chloride 96, CO2 was 32. Her WBC count 10.1, hemoglobin 12.7, platelet count was 507.  Urinalysis: Nitrites negative, leukocytes negative. EKG showed normal sinus rhythm without any ST-T wave changes. Chest x-ray on admission showed no acute cardiopulmonary processes. EKG on admission showed normal sinus rhythm without any ST-T wave changes.    HOSPITAL COURSE: Please refer to H and P done by the admitting physician. The patient is a 54 year old female with multiple medical problems including diabetes, fibromyalgia,  osteoporosis, and gout, who had an acute recent gout flare and was started on prednisone. After starting on prednisone for a few days, she started having severe substernal discomfort as well as painful swallowing. The patient came to the ED. She was not able to keep anything by mouth even not her medications. Therefore, we were asked to admit the patient. The patient was thought to have candida esophagitis and was started on fluconazole. She also was placed on PPI. She was seen in consultation by GI who deferred EGD because the patient was improving. The patient was continued on fluconazole. Also she started complaining of pain and swelling in her knee  related to her gout flare so she had to be started on prednisone. Today, on the day of discharge, she is doing much better and is able to tolerate a regular diet. Her pain in her leg is much improved. At this time, she is stable for discharge.   DISCHARGE MEDICATIONS: Metformin 500 mg 1 tab p.o. b.i.d., simvastatin 20 mg at bedtime, Cymbalta 60 daily, primidone 250 mg 1 tab p.o. b.i.d., meloxicam 7.5 mg 1 tab p.o. daily, Spiriva 18 mcg daily, Ventolin 2 puffs 4 times a day, theophylline extended release 200 mg 1 tab p.o. daily, Lantus 20 units subcutaneous at bedtime, sumatriptin100 mg 1 tab p.o. daily as needed for migraines, DuoNebs 4 times a day as needed, Singulair 10 daily, alprazolam 0.1 one tab 4 times a day as needed, Symbicort 2 puffs b.i.d., Colcrys 0.6 one tab p.o. daily, metoprolol tartrate 25, one tab p.o. b.i.d., indomethacin 25 daily, Lyrica 100 mg 1 tab p.o. b.i.d., OxyContin 30 mg 1 tab p.o. b.i.d., oxycodone 15 mg 1 tab p.o. t.i.d. as p.r.n., cetirizine 10 daily as needed for allergies, methocarbamol 500 mg 1 tab p.o. t.i.d. as needed for spasms, Baclofen 10 one tab p.o. t.i.d. as needed for spasms, Lasix 20 daily, fluconazole 100 mg 1 tab p.o. q. 24 for the next 3 days, nystatin swish and swallow 5 mL q. 6 for the next 5 days, Protonix 40 daily, prednisone 20 mg once a day for the next 3 days.   DIET: Carbohydrate consistent 4 grams sodium diet.   ACTIVITY: As tolerated.   FOLLOWUP: With primary M.D. in  1 to 2 weeks.   TIME SPENT:  35 minutes spent on the discharge.    ____________________________ Lafonda Mosses. Posey Pronto, MD shp:dp D: 10/11/2012 08:14:26 ET T: 10/11/2012 09:16:50 ET JOB#: [id number]  cc: Chalon Zobrist H. Posey Pronto, MD, <Dictator> Alric Seton MD ELECTRONICALLY SIGNED 10/14/2012 18:23

## 2014-06-11 NOTE — H&P (Signed)
PATIENT NAME:  Tricia [name], Tricia [name] MR#:  [id number] DATE OF BIRTH:  October 17, 1960  DATE OF ADMISSION:  11/07/2012  PRIMARY CARE PHYSICIAN: Dr. Keith Rake.   REFERRING PHYSICIAN: Delene Loll, PA.  CHIEF COMPLAINT: Right lower extremity pain.   HISTORY OF PRESENT ILLNESS: Tricia [name] is a 54 year old female, markedly obese, with a history of insulin-dependent diabetes mellitus, fibromyalgia on high-dose pain medication, osteoporosis. Presented to the Emergency Department with complaints of pain and swelling in the right lower extremity. The patient states she has been having flare-up of increased swelling in the right lower extremity since May 2014. The patient received 1 dose of vancomycin and Zosyn in the Emergency Department. The patient is afebrile. Has a normal white blood cell count.   PAST MEDICAL HISTORY:  1. Diabetes mellitus, insulin dependent.  2. Fibromyalgia.  3. Hyperlipidemia.  4. COPD.   5. Gout.  6. Degenerative joint disease.  7. Chronic back pain.  8. Carpal tunnel release.  9. Tubal ligation.   ALLERGIES: AUGMENTIN.   HOME MEDICATIONS:  1. Ventolin 90 mcg 2 puffs 4 times a day.  2. Theophylline extended release 200 mg every 12 hours.  3. Symbicort 160/4.5 two puffs 2 times a day.  4. Sumatriptan 100 mg oral daily as needed.  5. Spiriva 18 mcg daily.  6. Singulair 10 mg once a day.  7. Simvastatin 20 mg at bedtime.  8. Primidone 250 mg 2 times a day.  9. Prednisolone 20 mg once a day.  10. Protonix 40 mg once a day.  11. OxyContin 30 mg 2 times a day.  12. Oxycodone 15 mg 3 times a day.  13. Nystatin 100,000 units every 6 hours.  14. Metoprolol 25 mg b.i.d.  15. Methocarbamol 500 mg 3 times a day.  16. Metformin 500 mg 2 times a day.  17. Meloxicam 7.5 mg once a day.  18. Maxalt 10 mg 2 times a day.  19. Lyrica 100 mg 2 times a day.  20. Lantus 20 units once a day.  21. Indomethacin 25 mg once a day.  22. Lasix 20 mg once a day.  23. Fluconazole 100 mg  daily.  24. DuoNeb inhalations 4 times a day.  25. Cymbalta 60 mg once a day.  26. Colchicine 0.6 mg once a day as needed.  27. Cetirizine 10 mg once a day.  28. Baclofen 10 mg 3 times a day.  29. Alprazolam 0.5 mg 4 times a day.   SOCIAL HISTORY: Continues to smoke 1/2 pack a day. Denies drinking alcohol or using illicit drugs.   FAMILY HISTORY: Mother with COPD. Father with lung CA and stroke.   REVIEW OF SYSTEMS:  CONSTITUTIONAL: Denies any generalized weakness.  ENT: No change in hearing, tinnitus or sore throat.  EYES: No change in vision.  RESPIRATORY: No cough, shortness of breath.   CARDIOVASCULAR: No chest pain, palpitations.  GASTROINTESTINAL: No nausea, vomiting, abdominal pain.  GENITOURINARY: No dysuria or hematuria.  HEMATOLOGIC: No easy bruising or bleeding.  MUSCULOSKELETAL: The patient has history of fibromyalgia and degenerative joint disease.  SKIN: Has right lower extremity mild increased warmth.  NEUROLOGIC: No weakness or numbness in any part of the body.   PHYSICAL EXAMINATION:  GENERAL: This is a well-built, well-nourished, age-appropriate female lying down in the bed, not in distress.  VITAL SIGNS: Temperature 96.2, pulse 110, blood pressure 135/67, respiratory rate of 18, oxygen saturation is 94% on room air.  HEENT: Head normocephalic, atraumatic. There is no scleral icterus.  Conjunctivae normal. Pupils equal and react to light. Extraocular movements are intact. Mucous membranes moist. No pharyngeal erythema.  NECK: Supple. No lymphadenopathy. No JVD. No carotid bruit.  CHEST: Has no focal tenderness.  LUNGS: Bilaterally clear to auscultation.  HEART: S1, S2, regular, tachycardia.  ABDOMEN: Bowel sounds present. Soft, nontender, nondistended.  EXTREMITIES: Right lower extremity is more swollen than the left. Slightly increased warmth. No obvious erythema or pain.  NEUROLOGIC: The patient is alert, oriented to place, person and time. Cranial nerves II  through XII intact. Motor 5/5 in upper and lower extremities.   LABS: CBC: WBC of 9, hemoglobin 11.3, platelet count of 319.   Chemistry: Glucose 250. The rest of all of the values are within normal limits.   BNP is 31.   ASSESSMENT AND PLAN: Tricia [name] is a 54 year old female who comes to the Emergency Department with right lower extremity swelling and pain.  1. Right lower extremity cellulitis: Concerning the patient being diabetic, will keep the patient on vancomycin and Zosyn. No obvious ulcers are seen. No obvious demarcation of the cellulitis. Keep the leg elevated. Continue with the current pain medication. The patient already had lower extremity Dopplers which were negative for any lower extremity deep venous thrombosis.  2. Diabetes mellitus: Continue with the home dose of Lantus. Will keep the patient on sliding scale insulin.  3. Hypertension: Currently well controlled.  4. Morbid obesity: Will need further counseling with the patient.  5. Keep the patient on deep vein thrombosis prophylaxis with Lovenox.  TIME SPENT: 45 minutes.     ____________________________ Monica Becton, MD pv:gb D: 11/07/2012 00:50:56 ET T: 11/07/2012 02:22:35 ET JOB#: [id number]  cc: Monica Becton, MD, <Dictator> Monica Becton MD ELECTRONICALLY SIGNED 11/11/2012 21:05

## 2014-06-11 NOTE — Consult Note (Signed)
PATIENT NAME:  Tricia [name], Tricia [name] MR#:  [id number] DATE OF BIRTH:  [birth year]  DATE OF CONSULTATION:  10/08/2012  REFERRING PHYSICIAN:   CONSULTING PHYSICIAN:  Manya Silvas, MD  HISTORY OF PRESENT ILLNESS:  The patient is a 54 year old white female who had a flare of gout and got prednisone for this. Because of the prednisone, she developed significant thrush and had odynophagia for the last 3 days associated with nausea. She came to the ER and was noted to have white plaques in the back of her throat. She was treated with large doses of  IV Diflucan 400 mg in the ER and was started on nystatin. Because of her inability to swallow and concern that she may not be able to hydrate or take her diabetes, she was admitted to the hospital. I was asked to see her in consultation to see if she needs an upper endoscopy.   PAST MEDICAL HISTORY: Anxiety, depression, fibromyalgia, gout, diabetes, insulin-dependent, and hyperlipidemia.   PAST SURGICAL HISTORY: Carpal tunnel release syndrome, both wrists.   ALLERGIES: AUGMENTIN CAUSES DIARRHEA.   HABITS: Smokes a pack a day. She quit smoking 2 days ago. Denies alcohol, denies dip  snuff or chew tobacco.   FAMILY HISTORY: Positive for COPD and lung cancer and stroke.   The patient denies any previous episodes of similar yeast infections.   PHYSICAL EXAMINATION GENERAL: White female in no acute distress.  HEENT:  Her tongue now shows no whitish plaques.  VITAL SIGNS: Temperature 98.7, pulse 72, respirations 20, blood pressure 130/80.  CHEST: Clear.  HEART: Regular rate and rhythm.  ABDOMEN: Negative.   PLAN: I think she should do well with the Diflucan and nystatin. I would hold off endoscopy only if she does not tolerate medication and does not symptomatically improve with the medication. She denies any problems with swallowing before the prednisone and the yeast.   ____________________________ Manya Silvas, MD rte:cs D: 10/08/2012  18:27:29 ET T: 10/08/2012 19:04:03 ET JOB#: [id number]  cc: Manya Silvas, MD, <Dictator> Manya Silvas MD ELECTRONICALLY SIGNED 10/19/2012 16:26

## 2014-06-11 NOTE — Consult Note (Signed)
QD:IYMEBRA infection oral and esophagus.  Pt swallowing much better today, she feels her gout is flared and on steroids now.  No need for EGD so I will sign off, reconsult it needed.  Electronic Signatures: Manya Silvas (MD)  (Signed on 21-Aug-14 18:08)  Authored  Last Updated: 21-Aug-14 18:08 by Manya Silvas (MD)

## 2014-06-11 NOTE — Consult Note (Signed)
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   Patient was seen by consultant.   Consult note dictated.   Recommend further assessment or treatment.   Orders entered.   Discussed with Attending MD.   Comments: will order MRI to check for fractures to mid and rearfoot which could be a precursor to charcot.  Spoke with Dr. Billie Lade regarding using colchine or steroids for treatment.  Electronic Signatures: Perry Mount (MD)  (Signed 14-May-14 13:08)  Authored: Brief Consult Note   Last Updated: 14-May-14 13:08 by Perry Mount (MD)

## 2014-06-11 NOTE — Discharge Summary (Signed)
PATIENT NAME:  Tricia [name], Tricia [name] MR#:  [id number] DATE OF BIRTH:  November 08, 1960  DATE OF ADMISSION:  11/07/2012 DATE OF DISCHARGE:  11/09/2012  DISCHARGE DIAGNOSES:  1.  Right leg cellulitis.  2.  Chronic obstructive pulmonary disease.  3.  Hypertension.  4.  Diabetes mellitus type 2.  5.  Chronic pain.  6.  Resolved hyponatremia.  7.  Fibromyalgia.  8.  Hyperlipidemia.   DISCHARGE MEDICATIONS:  1.  Metformin 500 mg p.o. b.i.d.  2.  Simvastatin 20 mg p.o. daily.  3.  Cymbalta 60 mg p.o. daily.  4.  Primidone 250 mg p.o. b.i.d.  5.  Meloxicam 7.5 mg p.o. daily.  6.  Spiriva 18 mcg inhalation daily.  7.  Ventolin 90 mcg two puffs four times daily.  8.  Theophylline 200 mg p.o. every 12.  9.  Lantus 20 units daily.  10. 11.  DuoNebs every six hours as needed.  12.  Singulair 10 mg p.o. daily.  13.  Xanax> p.o. four times daily as needed for anxiety.   14. Symbicort 160/4.5 two puffs p.o. b.i.d.  15.  Metoprolol 25 mg p.o. daily.  16.  Colcrys 0.6 mg. p.o. daily.  17.  Maxalt 10 mg p.o. b.i.d.  16.  Lyrica 100 mg p.o. b.i.d.  17.  OxyContin 30 mg p.o. b.i.d.  18.  Sertraline 10 mg p.o. daily.  .  20.  Baclofen 10 mg p.o. daily.  21.  Furosemide 20 mg p.o. daily.  22.  Pantoprazole 40 mg p.o. daily.  23.  Prednisone 5 mg p.o. daily.  24.  LeLevaquin 750 mg daily.  25levaqui.le given for 5 days.    HOSPITAL COURSE: A 54 year old female patient admitted for right leg cellulitis. Because of her diabetes the patient was admitted precautionary. The patient was started on vancomycin and Zosyn. Right leg ultrasound did not show any DVT. The patient was admitted in August for the same. Right leg cellulitis improved, redness improved, and swelling improved the following day. [name] laboratory data showed a white count 3.5 yesterday and on admission it was 9.  [name] creatinine also was normal. Sodium was 127 on admission and yesterday it was 133. The patient remained  afebrile and she was just treated with antibiotics for a urinary tract infection. Lower extremity ultrasound showed no DVT and does have large lymph nodes in the groin bilaterally. The patient did have some bacteria in the urine, but Levaquin should cover that as well.  [name] other diagnoses significant for chronic pain depression, diabetes, hypertension, hyperlipidemia, and fibromyalgia. The patient also has chronic obstructive pulmonary, still continues to smoke.  I advised her to quit.  [name] medications were continued.   This discharge took about more than 30 minutes.    ____________________________ Epifanio Lesches, MD sk:cc D: 11/09/2012 15:34:27 ET T: 11/09/2012 20:15:49 ET JOB#: [id number]  cc: Epifanio Lesches, MD, <Dictator> [facility] Myrla Halsted, MD  Epifanio Lesches MD ELECTRONICALLY SIGNED 11/24/2012 22:03

## 2014-06-11 NOTE — H&P (Signed)
PATIENT NAME:  Tricia [name], Tricia [name] MR#:  [id number] DATE OF BIRTH:  [birth year]  DATE OF ADMISSION:  07/01/2012  REFERRING PHYSICIAN: Conni Slipper.   PRIMARY CARE PHYSICIAN:  Dr. Keith Rake.   CHIEF COMPLAINT:  Edema and pain of the left lower extremity.   HISTORY OF PRESENT ILLNESS:  Tricia [name] is a very nice 54 year old female who has a history of diabetes, gout, dyslipidemia, fibromyalgia. Comes today with a history of 1 week of edema of the lower extremity. Apparently last Tuesday, the patient went to bed feeling fine. In the morning, woke up with a swollen, red and painful left foot.  The patient denies any trauma, any broken skin or any other issues prior to this finding. The patient states that she went to work and she was hurting. The swelling was mild and now became severe for which she visited her primary care physician 2 days ago. Her primary care physician ordered an x-ray that was done yesterday.  The x-ray showed no fractures, bone destruction or any other problems, other than significant soft tissue swelling over the mid foot. At this moment, there is no soft tissue gas and no foreign body demonstrated there, but the patient has significant swelling, and since she is diabetic, there is a high risk and high  suspicions of this being pseudomonas from possible sores of web spaces of her feet for diabetic feet.   She denies any fever, but she has significant chills. Her pain is 8 or 9 out of 10, is diffuse over the lower extremity, is dull and continuous, and is not finding any alleviating factors other than  pain medications at this moment.   The patient is admitted for evaluation and treatment of this condition.   REVIEW OF SYSTEMS:  A twelve-system review of systems is done.  CONSTITUTIONAL: No fever. Positive fatigue. Negative weakness. Negative weight loss or weight gain.  EYES:  No blurry vision, double vision or inflammation.  EARS, NOSE, THROAT:  No tinnitus, snoring,  postnasal drip or difficulty swallowing.  RESPIRATORY:  No cough, wheezing or hemoptysis.  CARDIOVASCULAR:  No chest pain, orthopnea. Positive edema of the lower extremity.  The patient has occasional edema, mild, whenever she is on her feet but not all of the time, and it resolves with resting. No palpitations, no syncope.  GASTROINTESTINAL:  No nausea, vomiting, abdominal pain, constipation, diarrhea or melena.  GENITOURINARY: No dysuria, hematuria or changes in frequency.  GYNECOLOGIC:  No breast masses.   ENDOCRINOLOGY:  No polyuria, polydipsia or polyphagia. She has well controlled diabetes apparently. Thyroid: No cold or heat intolerance.  HEMATOLOGIC:  No anemia, easy bruising or swollen glands.  SKIN:  Positive erythema of the lower extremity, as mentioned above. No other rashes. No change in moles. No petechiae.  MUSCULOSKELETAL:  No significant swelling of the joints at this moment. She does have gout, but gout attack was about 1 year ago. Denies any muscle cramps. Positive chronic back pain and chronic neck pain.  NEUROLOGIC:  Positive occasional numbness of the lower extremity, mild neuropathy. Positive benign essential tremor. No vertigo. No CVAs. No TIAs. No ataxia.  PSYCHIATRIC:  The patient has fibromyalgia and mild depression, but not significant at this moment.   PAST MEDICAL HISTORY: 1.  Osteoporosis.  2.  Type 2 insulin-dependent diabetes.  3.  Gout.  4.  Reactive airway disease/COPD. 5.  Dyslipidemia.  6.  DJD.  7.  Fibromyalgia.  8.  Benign essential tremor.  9.  Chronic back  pain.  10.  Carpal tunnel syndrome.  11.  Chronic headaches.   ALLERGIES: THE PATIENT STATES SHE IS ALLERGIC TO AUGMENTIN, BUT HER REACTION IS ONLY DIARRHEA. NO RASHES OR OTHER PROBLEMS.   PAST SURGICAL HISTORY:  1.  Carpal tunnel release syndrome, 2 times on right wrist, 1 time on left wrist.   2.  Positive D and C, twice.  3.  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  4.  BTO.   FAMILY  HISTORY:   Positive for MI in her mother's family, grandparents. Positive COPD, emphysema in her mom and lung cancer in her father, who also had a CVA.   SOCIAL HISTORY:  The patient is a smoker. She smokes 1 pack a day. She smoked since the age of 5, and she states that she is not able to quit. Smoking cessation and education given to the patient for over 5 minutes. The patient states that she will try, and she will be okay getting patches today.   No alcohol. The patient works at [employer] as Therapist, art.    MEDICATIONS:  Zantac 150 mg twice daily, Ventolin 90 mcg 2 puffs 4 times a day as needed, Symbicort 160/4.5 inhale 2 puffs once daily, Spiriva 18 mcg once a day, Singulair 10 mg once a day, simvastatin 20 mg once a day, primidone 250 mg take 1 twice daily, metformin 500 mg twice daily, meloxicam 7.5 mg once daily, Maxalt 5 mg once daily, Lantus 20 units at bedtime, DuoNeb 0.5/2.5 every 6 hours as needed for shortness of breath, Cymbalta 60 mg once a day, alprazolam 0.5 mg 3 times a day and Tylenol with hydrocodone 4 times a day as needed for pain.   PHYSICAL EXAMINATION: VITAL SIGNS:  Blood pressure 111/56, pulse 105, respirations 20, temperature 99.3.  GENERAL:  The patient is alert and oriented x 3, mild distress due to pain at the lower extremity.  No respiratory distress. Hemodynamically stable.  HEENT:  Pupils are equal and reactive. Extraocular movements intact. Mucosa is started slightly dry. Anicteric sclerae. Pink conjunctivae. No oral lesions. No oropharyngeal exudates.  NECK:  Supple. No JVD. No thyromegaly. No adenopathy. No carotid bruits. No rigidity.  CARDIOVASCULAR:  Regular rate and rhythm. No murmurs, rubs or gallops appreciated at this moment. No displacement of PMI.  LUNGS:  Clear without any wheezing or crepitus. There is diffuse decrease of respiratory sounds in the periphery. No audible wheezing at this moment. No use of accessory muscles.  ABDOMEN:  Soft, nontender,  nondistended. No hepatosplenomegaly. No masses. Bowel sounds are positive.  GENITAL:  Deferred.  EXTREMITIES:  Positive significant edema of the left lower extremity from the knee down to the tip of the toes. There is pitting edema +3. There is erythema surrounding the whole leg, but more pronounced on the anterior tibialis area. Her web spaces have some broken skin, but no significant suppuration of purulent fluids. Her toenails are hypertrophic and no other point of entrance of infection. No other skin breakdown. No other places of suppurative cellulitis, just erythema and tenderness to palpation. No signs of erysipelas. The right lower extremity is normal with only pitting edema, which is trace edema. Tenderness to palpation along the whole calf muscle and dorsum of the foot. Pulses are faint but palpable due to the edema. Capillary refill around 3 seconds.  LYMPHATIC:  Negative for lymphadenopathy on neck or supraclavicular area. There is an incidental finding of lymph node in left femoral area, which was not palpable. It was  found by ultrasound.  SKIN:  As mentioned above, lower extremity edema, but no other rashes or petechiae.   MUSCULOSKELETAL:  No significant joint effusion. There is some swelling at the level of the ankle, but it is related to cellulitis, I do not think there is at the level of the joint.  NEUROLOGIC:  Cranial nerves II through XII intact. Strength is equal in all 4 extremities. Sensation is decreased in the lower extremities due to neuropathy.  PSYCHIATRIC:  No significant agitation. Alert, oriented  x 3.   RADIOLOGIC AND LABORATORY DATA: X-ray of the lower extremities done yesterday show no gas, although there is significant increase of edema on the dorsum of the foot and leg. No osteoporosis. No osteomyelitis. Ultrasound of the left lower extremity: No DVT. Positive incidental finding of an enlarged lymph node at the level of the femoral area. Glucose is 177, sodium level is  124, potassium is 3.9, chloride is 86, anion gap is decreased at 6, albumin is 2.9. all other levels were within normal limits. White count is 5.9, but her ESR is 135; hemoglobin is 11.3, hematocrit 31, platelets 290. D-dimer is slightly elevated at 1.02.   ASSESSMENT AND PLAN: A 54 year old female with a history of osteoporosis, chronic obstructive pulmonary disease, diabetes, gout, fibromyalgia, hyperlipidemia, comes with cellulitis of the left lower extremity with significant edema.  1.  Diabetic foot infection, lower extremity infection. The patient with cellulitis. At this moment, the patient is at risk of certain pathogens including pseudomonas and anaerobes, as the patient has significant history of diabetes. The patient has significant edema, but no crepitation at this moment. There was no gas seen on the x-rays from yesterday, for which at this moment I am going to cover with Zosyn and daptomycin to cover pseudomonas and possibility of anaerobes, as well as methicillin-resistant Staphylococcus aureus due to the severity of the infection and the swelling. A podiatry consult is going to be obtained in the morning. Blood cultures have been taken. We are going to monitor the patient closely, give her IV fluids. At this moment, she is not showing significant sepsis, but this infection could spread rapidly.  2.  Diabetes. The patient is on insulin sliding scale. Continue Lantus insulin, diabetic diet. Monitor closely.  3.  Gout. The patient does not have active gout that I can tell, but I am going to get a uric acid level stat to see if there are any signs of gout. At this moment, everything seems to be related to infection.  4.  Fibromyalgia, stable. Continue pain medications.  5.  Dyslipidemia, stable.  6.  Benign essential tremor. The patient takes benzodiazepines. They work fine.  7.  Hyponatremia. This is likely due to do intravascular volume depletion, as the patient looks slightly dehydrated. This  could be also related to the infection. We are going to give her IV fluids.  8.  Anemia of chronic disease, mild. The patient is hemodynamically stable.  9.  Other medical problems seem to be stable. The patient is a full code.  10.  Gastrointestinal prophylaxis with proton pump inhibitor, deep vein thrombosis prophylaxis with heparin.   TIME SPENT: I spent about 45 minutes with this patient.    ____________________________ [place] Sink, MD rsg:dmm D: 07/01/2012 21:47:24 ET T: 07/01/2012 22:16:29 ET JOB#: [id number]  cc: [place] Sink, MD, <Dictator> Jery Hollern America Brown MD ELECTRONICALLY SIGNED 07/03/2012 22:22

## 2014-06-11 NOTE — H&P (Signed)
PATIENT NAME:  Tricia [name], Tricia [name] MR#:  [id number] DATE OF BIRTH:  03/26/1960  DATE OF ADMISSION:  10/08/2012  PRIMARY CARE PHYSICIAN:  Dr. Keith Rake.   REFERRING PHYSICIAN:  Dr. Kerman Passey.   CHIEF COMPLAINT:  Painful swallowing.  HISTORY OF PRESENT ILLNESS:  The patient is a 54 year old Caucasian female with past medical history of insulin-dependent diabetes mellitus, fibromyalgia, osteoporosis and multiple other medical problems is presenting to the ER with a chief complaint of painful swallowing.  She has history of gout and she started taking prednisone for exacerbations of the gout.  Eventually she started developing pain with swallowing and pain in the left side of the chest for the past three days, which is continuous in nature, associated with nausea.  Denies any vomiting and could not tolerate by mouth because of the nausea and painful swallowing.  The patient comes to the ER and they have noticed white plaques in the back of the throat.  The patient being insulin-dependent diabetic she was diagnosed with candidal esophagitis as she was on steroids as well.  The patient was given 400 mg of IV Diflucan in the ER and hospitalist team is called to admit the patient.  During my examination, the patient is complaining of pain in the left side of the chest wall with movement and sitting posture.  She also has reported that as she took steroids for the past five days, her left ankle pain from gout exacerbation significantly improved.  The patient denies any abdominal pain or diarrhea.  No other complaints today.  No fever.  No chills.  No similar complaints in the past.   PAST MEDICAL HISTORY:  Insulin-dependent diabetes mellitus, gout, osteoporosis, hyperlipidemia, fibromyalgia, anxiety and depression.   PAST SURGICAL HISTORY:  D and C twice,  carpal tunnel release syndrome two times in the right wrist and one time on the left wrist.     ALLERGIES:  The patient is allergic to AUGMENTIN, WHICH  GIVES HER DIARRHEA.  NO RASHES OR OTHER PROBLEMS WITH AUGMENTIN.   PSYCHOSOCIAL HISTORY:  Lives at home with boyfriend Mr. London Pepper.  She smokes 1 pack a day, but reporting that quit smoking two days ago.  Denies alcohol or illicit drug usage.   FAMILY HISTORY:  Mom has history of COPD.  Father had history of lung cancer and stroke.   REVIEW OF SYSTEMS:  CONSTITUTIONAL:  Denies any fever or fatigue.  EYES:  Denies blurry vision, glaucoma.  EARS, NOSE, THROAT:  No epistaxis or discharge.  RESPIRATION:  Denies any cough.  No wheezing.  CARDIOVASCULAR:  Complaining of left-sided anterior chest wall pain with turning and tossing.  No orthopnea.  Denies syncope.  GASTROINTESTINAL:  Complaining of nausea and vomiting.  Denies any diarrhea or abdominal pain.  GENITOURINARY:  No dysuria or hematuria.  GYNECOLOGIC AND BREAST:  Denies breast mass or vaginal discharge.  HEMATOLOGY AND LYMPHATIC:  No anemia, easy bruising. ENDOCRINE:  No polyuria, nocturia.  Has history of insulin-dependent diabetes mellitus.  INTEGUMENTARY:  No acne, rash, lesions.  MUSCULOSKELETAL:  No joint pain in the neck, back, shoulders.  Has history of gout.  NEUROLOGIC:  No vertigo, no ataxia or dementia.  PSYCHIATRIC:  Positive depression and anxiety.   PHYSICAL EXAMINATION: VITAL SIGNS:  Temperature 98.7, pulse 80, respirations are 18 to 20, blood pressure is 141/65, pulse ox 98% on room air.  GENERAL APPEARANCE:  Not under acute distress.  Moderately built and nourished.  HEENT:  Normocephalic, atraumatic.  Pupils are  equally reacting to light and accommodation.  No scleral icterus.  No conjunctival injection.  No sinus tenderness.  Examination of the oral cavity, moist mucous membranes, white plaques and exudates were noticed at the back of the throat, tonsils.  Uvula is midline.  No pharyngeal edema.  NECK:  Supple.  No JVD.  No thyromegaly.  No lymphadenopathy.  LUNGS:  Clear to auscultation bilaterally.  No  accessory muscle usage.  Positive reproducible anterior chest wall tenderness on palpation on the left side of the chest.  CARDIAC:  S1, S2 normal.  Regular rate and rhythm.  No murmurs.  GASTROINTESTINAL:  Soft.  Bowel sounds are positive in all four quadrants.  Nontender, nondistended.  No hepatosplenomegaly.  NEUROLOGIC:  Awake, alert, oriented x 3.  Motor and sensory are grossly intact.  Reflexes are 2+.  EXTREMITIES:  Left-sided ankle, minimal erythema is present.  Point tenderness, but edema is minimal reported by the patient.  Right lower extremity, no cyanosis.  No clubbing.  No edema.  SKIN:  Warm to touch.  Normal turgor.  No rashes.  No lesions.  PSYCHIATRIC:  Normal mood and affect.   LABORATORY AND IMAGING STUDIES:  Glucose 131, BUN 6, creatinine 0.58, sodium 134, potassium 3.6, chloride 96, CO2 32, GFR greater than 60.  Anion gap 6, serum osmolality 268, calcium 9.1, total protein 7.1, albumin serum 2.9, bili total is 0.3, alkaline phosphatase 116, AST 16, ALT 14, troponin less than 0.02.  WBC of 10.1, hemoglobin is 12.7, hematocrit 36.2, platelets 507,000.  CBC normal except platelet count is high to 507.  A 12-lead EKG, normal sinus rhythm at 78 beats per minute, normal PR interval, normal QT and corrected QT intervals.   ASSESSMENT AND PLAN:  A 54 year old female with a history of insulin-dependent diabetes mellitus.  The patient was on steroids for gout flare-up, comes with painful swallowing and white plaque started on the back of the throat.   1.  Odynophagia, probably from candidal esophagitis.  We will admit the patient to medical floor.  We will give her clear liquid diet as tolerated.  Throat cultures were obtained including streptococcal pharyngitis.  Diflucan IV 400 mg was given in the ER and we will continue Diflucan 100 mg IV once daily.  We will provide the patient with pain treatment.  2.  Gout exacerbation.  Clinically better.  We will hold off on the steroids for now.  3.   Fibromyalgia.  We will provider her some pain management.  4.  Insulin-dependent diabetes mellitus.  The patient will be on sliding scale insulin.  5.  We will provide her GI prophylaxis with Protonix IV.  DVT prophylaxis with Lovenox subQ.   6.  She is FULL CODE.  [name] boyfriend London Pepper is the medical power of attorney.   Total time spent on the admission is 50 minutes.     ____________________________ Nicholes Mango, MD ag:ea D: 10/08/2012 00:52:43 ET T: 10/08/2012 02:45:02 ET JOB#: [id number]  cc: Nicholes Mango, MD, <Dictator> [name] Fountain, MD Nicholes Mango MD ELECTRONICALLY SIGNED 10/09/2012 19:36

## 2014-06-12 NOTE — H&P (Signed)
PATIENT NAME:  Tricia [name], Tricia [name] MR#:  [id number] DATE OF BIRTH:  February 21, 1960  DATE OF ADMISSION:  11/16/2013  REFERRING PHYSICIAN: Rodrigo Ran, PA  PRIMARY CARE PHYSICIAN: Rochel Brome, MD  PULMONOLOGIST: Freda Munro. Raul Del, MD  CHIEF COMPLAINT: Shortness of breath.  HISTORY OF PRESENT ILLNESS: A 54 year old Caucasian female with a history of COPD who does not require supplemental oxygen, continues tobacco abuse, type 2 diabetes insulin requiring, presenting with shortness of breath. describes shortness of breath for 4- to 5-day duration with associated cough productive of greenish sputum as well as chills and prominent wheezing. She, however, denies any fevers or chest pain. On the onset of symptoms, she called her pulmonologist, started on Levaquin, steroids, and DuoNeb treatments; however, despite these treatments, continued to have symptoms as stated above including wheezing and shortness of breath. Thus, presented to the hospital for further workup and evaluation.  REVIEW OF SYSTEMS:  CONSTITUTIONAL: Positive for fatigue, weakness, chills. Denies fever. EYES: Denies blurry vision, double vision, or eye pain. EARS: Denies tinnitus, ear pain, hearing loss.  RESPIRATORY: Positive for cough, shortness of breath as described above as well as wheezing.  CARDIOVASCULAR: Denies chest pain, palpitations, edema.  GASTROINTESTINAL: Denies nausea, vomiting, diarrhea, or abdominal pain. GENITOURINARY: Denies dysuria, hematuria. ENDOCRINE: Denies nocturia or thyroid problems. HEMATOLOGIC AND LYMPHATIC: Denies easy bruising, bleeding.  SKIN: Denies rash or lesions.  MUSCULOSKELETAL: Denies pain in neck, back, shoulder, knees, hips, or arthritic symptoms.  NEUROLOGIC: Denies paralysis or paresthesias.  PSYCHIATRIC: Denies anxiety or depressive symptoms.  Otherwise, full review of systems performed by me is negative.   PAST MEDICAL HISTORY: COPD, non-oxygen dependent; hyperlipidemia; type 2  diabetes, insulin requiring; as well as continued tobacco abuse.   SOCIAL HISTORY: Positive for everyday tobacco use. Denies any alcohol or drug use.   FAMILY HISTORY: Positive for COPD as well as lung cancer.   ALLERGIES: AUGMENTIN, CAUSING DIARRHEA.   HOME MEDICATIONS: Include meloxicam 7.5 mg p.o. q. daily, oxycodone 20 mg p.o. q. 6 hours p.r.n. pain, oxycodone extended release 30 mg p.o. b.i.d., Lyrica 100 mg p.o. b.i.d., primidone 250 mg p.o. b.i.d., Cymbalta 60 mg p.o. q. daily, Lantus 20 units subcutaneous q. daily, metformin 500 mg p.o. b.i.d., cetirizine 10 mg p.o. q. daily as needed for allergies, Simvastatin 20 mg p.o. at bedtime, alprazolam 0.5 mg p.o. every 6 hours as needed for anxiety, metoprolol 25 mg p.o. b.i.d., DuoNeb treatments 3 mL 4 times a day as needed for shortness of breath, Spiriva 18 mcg inhalation once daily, Symbicort 160/4.5 mg 2 puffs b.i.d., theophylline 200 mg p.o. b.i.d., Ventolin 90 mcg inhalation 2 puffs 4 times a day as needed for shortness of breath, Lasix 20 mg p.o. q. daily, Singulair 10 mg p.o. q. daily, pantoprazole 40 mg p.o. q. daily.   PHYSICAL EXAMINATION:  VITAL SIGNS: Temperature 98.6, heart rate 100, respirations 24, blood pressure 177/84, saturating 92% on room air. Weight 98 kg, BMI of 31.9.  GENERAL: Well-nourished, well-developed Caucasian female, currently in no acute distress.  HEAD: Normocephalic, atraumatic.  EYES: Pupils equal, round, reactive to light and accommodation. Extraocular muscles intact. No scleral icterus.  MOUTH: Moist mucosal membranes. Dentition intact. No abscess noted. EARS, NOSE, THROAT: Clear without exudates. No external lesions.  NECK: Supple. No thyromegaly. No nodules. No JVD.  PULMONARY: Diminished breath sounds throughout all lung fields, likely secondary to respiratory effort. However, there are also coarse rhonchi heard at the right lung base. Good air entry bilaterally.  CHEST: Nontender to  palpation.   CARDIOVASCULAR: S1, S2, regular rate and rhythm. No murmurs, rubs, or gallops. No edema. Pedal pulses 2+ bilaterally.  GASTROINTESTINAL: Soft, nontender, nondistended. No masses. Positive bowel sounds. No hepatosplenomegaly.  MUSCULOSKELETAL: No swelling, clubbing, or edema. EXTREMITIES: Range of motion full in all extremities.  NEUROLOGIC: Cranial nerves II through XII intact. No gross focal neurologic deficit. Sensation intact. Reflexes intact. SKIN: No ulceration, lesion, rash, cyanosis. Skin warm and dry. Turgor intact.  PSYCHIATRIC: Mood and affect within normal limits. Patient awake, alert, and oriented x 3. Insight and judgment intact.   LABORATORY DATA: Chest x-ray performed revealing interstitial infiltrate over the right base suggestive of pneumonia. Remainder of laboratory data: Sodium 131, potassium 4, chloride 95, bicarbonate 26, BUN 7, creatinine 0.78, glucose 277. WBC 11, hemoglobin 13.7, platelets of 254,000.   ASSESSMENT AND PLAN: A 54 year old Caucasian female with a history of chronic obstructive pulmonary disease as well as continued tobacco abuse presenting with shortness of breath.  1.  Chronic obstructive pulmonary disease exacerbation in relation to community-acquired pneumonia as well as continuing tobacco abuse. Provide DuoNeb treatments q. 4 hours, supplem,ental 02 greater than 92%. Steroids with Solu-Medrol 60 mg IV q. daily. Antibiotic coverage with azithromycin and ceftriaxone as well as the addition of flutter valve therapy, and continue her home medications including theophylline, Spiriva, and Symbicort.  2.  Type 2 diabetes, insulin requiring. Continue Lantus. Hold p.o. agents. Add insulin sliding scale with q. 6 hour Accu-Cheks.  3.  Hypertension. Continue the Lopressor. 4.  Gastroesophageal reflux disease. Proton pump inhibitor therapy.  5.  Chronic pain. Continue home medications of oxycodone extended release 30 mg b.i.d. as well as p.r.n. medications.  6.   Venous thromboembolism prophylaxis with heparin subcutaneous.   CODE STATUS: Patient is full code.   TIME SPENT: 45 minutes.    ____________________________ Aaron Mose. Revere Maahs, MD dkh:ST D: 11/16/2013 09:32:67 ET T: 11/16/2013 22:55:16 ET JOB#: [id number]  cc: Aaron Mose. Cassanda Walmer, MD, <Dictator> Bardia Wangerin Woodfin Ganja MD ELECTRONICALLY SIGNED 11/17/2013 1:51

## 2014-06-12 NOTE — Discharge Summary (Signed)
PATIENT NAME:  Tricia [name], Tricia [name] MR#:  [id number] DATE OF BIRTH:  [birth year]  DATE OF ADMISSION:  11/16/2013 DATE OF DISCHARGE:  11/18/2013  DISCHARGE DIAGNOSES: 1. Acute exacerbation of chronic obstructive pulmonary disease.  2. Community-acquired pneumonia.  3. Diabetes mellitus type 2.  4. Hypertension.  5. Gastroesophageal reflux disease.  6. Chronic pain in the lumbar area.  7. Ongoing tobacco abuse.   CONSULTATIONS: None.   PROCEDURES: Chest x-ray, September 28 shows mild opacification of the right base suggestive of possible pneumonia.   BRIEF HISTORY OF PRESENT ILLNESS: This 54 year old woman with history of chronic obstructive pulmonary disease, not on home oxygen with continued tobacco abuse, type 2 diabetes presents with shortness of breath for 4 to 5 days associated with cough, green sputum, chills and wheezing. She had been treated as an outpatient by her pulmonologist with Levaquin, steroid taper and DuoNeb treatments for 3 days prior to presentation.   HOSPITAL COURSE AND TREATMENT:  1. Exacerbation of chronic obstructive pulmonary disease: Exacerbation likely due to community-acquired pneumonia, continued tobacco abuse. She was initially placed on Solu-Medrol 60 mg IV as well as azithromycin and ceftriaxone. She was also given flutter valve therapy. She was continued on her home medications including theophylline, Spiriva, Symbicort and she was provided with DuoNeb treatments every 4 hours. She did very well over the admission and is discharged on her home regimen of Spiriva, Symbicort, albuterol, Singulair, theophylline as well as continued Levaquin. She still has 6 tablets of Levaquin at home and will continue that prescription. She was also given a slower prednisone taper to be used over 2 weeks. She understands that she needs to stop smoking. She will follow up with her pulmonologist within 2 weeks of discharge.  2. Community-acquired pneumonia: Continue Levaquin. At the  time of discharge, she is afebrile, no leukocytosis.  3. Diabetes type 2: No changes were made to her home insulin regimen. She will continue on Lantus and metformin.  4. Hypertension: She will continue on Lopressor.  5. Gastroesophageal reflux disease: She will continue on PPI. It should be noted that she is also on nonsteroidal anti-inflammatories, which can exacerbate gastroesophageal reflux disease.  6. Chronic pain in the lumbar area: Continue home medications.  7. Ongoing tobacco abuse: She was counseled during this admission multiple times on quitting smoking. She will follow up with her pulmonologist and primary care physician.   PHYSICAL EXAMINATION: VITAL SIGNS: Temperature 97.9, pulse 65, respirations 20, blood pressure 147/85, oxygen saturation 98% on room air.  GENERAL: Alert, oriented, and dressed for discharge, comfortable.  RESPIRATORY: Lungs are clear to auscultation bilaterally with good air movement.  CARDIOVASCULAR: Regular rate and rhythm, no murmurs, rubs, or gallops.  EXTREMITIES: No edema, cyanosis or clubbing.   LABORATORY DATA: Blood sugar on day of discharge is 167. No other repeat labs on the day of discharge.   CONDITION ON DISCHARGE: Stable.   DISPOSITION: She is being discharged home.   DISCHARGE MEDICATIONS: 1. Metformin 500 mg 1 tablet twice a day.  2. Simvastatin 20 mg 1 tablet once a day at bedtime.  3. Cymbalta 60 mg 1 capsule daily.  4. Primidone 250 mg 1 capsule twice a day.  5. Meloxicam 7.5 mg 1 tablet once a day.  6. Spiriva 18 mcg inhalation capsule 1 capsule inhaled daily.  7. Ventolin HFA 90 mcg/inhalation 2 puffs inhaled 4 times a day as needed for shortness of breath.  8. Theophylline extended release 200 mg tablet 1 tablet every 12 hours.  9. Lantus SoloStar pen 20 units subcutaneously once a day.  10. Sumatriptan 100 mg 1 tablet orally once a day as needed for migraines.  11. DuoNeb 0.5 mg/2.5 mg in 3 ml inhalation, 3 ml inhaled 4 times a  day as needed for shortness of breath.  12. Singulair 10 mg 1 tablet once a day.  13. Alprazolam 0.5 mg 1 tablet orally 4 times a day as needed for anxiety.  14. Symbicort 160 mcg/4.5 mcg inhalation aerosol 2 puffs inhaled twice a day.  15. Colcrys 0.6 mg 1 tablet once a day as needed for gout flare-up.  16. Metoprolol tartrate 25 mg 1 tablet twice a day.  17. Maxalt 10 mg 1 tablet 1 to 2 times a day as needed for migraines.  18. Lyrica 100 mg 1 capsule twice a day.  19. OxyContin 30 mg 1 tablet twice a day.  20. Cetirizine 10 mg 1 tablet once a day.  21. Furosemide 20 mg 1 tablet once a day.  22. Nystatin 100,000 units/ ml oral suspension 5 ml orally every 6 hours.  23. Pantoprazole 40 mg 1 tablet once a day.  24. Levaquin 750 mg oral tablet 1 tablet every 24 hours for 6 days.  25. Oxycodone 20 mg 1 tablet every 6 hours as needed for pain.  26. Prednisone 10 mg 5 tablets orally once a day x 2 days decreasing by 10 mg every 2 days and then stop.   DIET: Low-fat, low-cholesterol carbohydrate controlled ADA diet.   ACTIVITY: No limitations.  REFERRALS: None.   FOLLOWUP:  Follow up with pulmonologist and primary care provider within 2 weeks of discharge.  TIME SPENT ON DISCHARGE: 40 minutes.   ____________________________ Earleen Newport. Volanda Napoleon, MD cpw:hh D: 11/18/2013 15:36:06 ET T: 11/19/2013 00:41:17 ET JOB#: [id number]  cc: Barnetta Chapel P. Volanda Napoleon, MD, <Dictator> Aldean Jewett MD ELECTRONICALLY SIGNED 11/20/2013 17:00

## 2014-06-22 DIAGNOSIS — M170 Bilateral primary osteoarthritis of knee: Secondary | ICD-10-CM | POA: Insufficient documentation

## 2014-07-22 ENCOUNTER — Telehealth: Payer: Self-pay

## 2014-07-22 NOTE — Telephone Encounter (Signed)
Patient notified and they will call back for appt. A.Greco Gastelum CMA

## 2014-07-22 NOTE — Telephone Encounter (Signed)
-----   Message from [name], MD sent at 07/21/2014  6:38 PM EDT ----- Regarding: Lab results.  A1c is not at goal at 8.9%. Please have pt. schedule appointment to discuss adding a third medication for diabetes. CMP is within normal limits.  FLP is at goal except for below normal HDL. Please schedule an appointment to discuss starting her on statin.

## 2014-08-09 ENCOUNTER — Telehealth: Payer: Self-pay | Admitting: Family Medicine

## 2014-08-09 NOTE — Telephone Encounter (Signed)
Last seen 4.25.16.

## 2014-08-09 NOTE — Telephone Encounter (Signed)
Pt states that she stopped taking the Lasix because she hasn't had swelling in her legs for over 6 mo. Pt is now experiencing swelling in her legs out and was wondering if Dr. Manuella Ghazi could call her in something. Pt uses [facility] in [place].

## 2014-08-10 ENCOUNTER — Telehealth: Payer: Self-pay | Admitting: Family Medicine

## 2014-08-10 NOTE — Telephone Encounter (Signed)
Tricia [name] from [employer] did receive the forms (disability) but the forms where blank. He is just checking to see if there is any work restrictions on the patient that will keep her from not returning to work. Please clarify (f) [phone] (p) [phone] ext [phone]

## 2014-08-10 NOTE — Telephone Encounter (Signed)
Called pt, LM to call office and make appt for Dr Manuella Ghazi to assess her swelling prior to restarting Lasix.

## 2014-08-10 NOTE — Telephone Encounter (Signed)
We do not determine or evaluate for disability.

## 2014-08-11 NOTE — Telephone Encounter (Signed)
Have tried calling Tricia [name] several times. Left message that we do not determine disability

## 2014-08-16 ENCOUNTER — Other Ambulatory Visit: Payer: Self-pay | Admitting: Orthopedic Surgery

## 2014-08-16 ENCOUNTER — Ambulatory Visit (INDEPENDENT_AMBULATORY_CARE_PROVIDER_SITE_OTHER): Payer: BLUE CROSS/BLUE SHIELD | Admitting: Family Medicine

## 2014-08-16 ENCOUNTER — Encounter: Payer: Self-pay | Admitting: Family Medicine

## 2014-08-16 VITALS — BP 100/60 | HR 95 | Temp 97.4°F | Ht 69.0 in | Wt 168.7 lb

## 2014-08-16 DIAGNOSIS — R600 Localized edema: Secondary | ICD-10-CM

## 2014-08-16 DIAGNOSIS — F3341 Major depressive disorder, recurrent, in partial remission: Secondary | ICD-10-CM

## 2014-08-16 DIAGNOSIS — M545 Low back pain, unspecified: Secondary | ICD-10-CM | POA: Insufficient documentation

## 2014-08-16 DIAGNOSIS — G5600 Carpal tunnel syndrome, unspecified upper limb: Secondary | ICD-10-CM | POA: Insufficient documentation

## 2014-08-16 DIAGNOSIS — Z794 Long term (current) use of insulin: Secondary | ICD-10-CM | POA: Diagnosis not present

## 2014-08-16 DIAGNOSIS — E559 Vitamin D deficiency, unspecified: Secondary | ICD-10-CM | POA: Insufficient documentation

## 2014-08-16 DIAGNOSIS — M2550 Pain in unspecified joint: Secondary | ICD-10-CM | POA: Insufficient documentation

## 2014-08-16 DIAGNOSIS — J45909 Unspecified asthma, uncomplicated: Secondary | ICD-10-CM | POA: Insufficient documentation

## 2014-08-16 DIAGNOSIS — E119 Type 2 diabetes mellitus without complications: Secondary | ICD-10-CM | POA: Diagnosis not present

## 2014-08-16 DIAGNOSIS — G43909 Migraine, unspecified, not intractable, without status migrainosus: Secondary | ICD-10-CM | POA: Insufficient documentation

## 2014-08-16 DIAGNOSIS — J4540 Moderate persistent asthma, uncomplicated: Secondary | ICD-10-CM | POA: Insufficient documentation

## 2014-08-16 DIAGNOSIS — I10 Essential (primary) hypertension: Secondary | ICD-10-CM | POA: Insufficient documentation

## 2014-08-16 DIAGNOSIS — B3781 Candidal esophagitis: Secondary | ICD-10-CM | POA: Insufficient documentation

## 2014-08-16 DIAGNOSIS — F17200 Nicotine dependence, unspecified, uncomplicated: Secondary | ICD-10-CM | POA: Insufficient documentation

## 2014-08-16 DIAGNOSIS — D131 Benign neoplasm of stomach: Secondary | ICD-10-CM | POA: Insufficient documentation

## 2014-08-16 DIAGNOSIS — E782 Mixed hyperlipidemia: Secondary | ICD-10-CM | POA: Insufficient documentation

## 2014-08-16 DIAGNOSIS — E109 Type 1 diabetes mellitus without complications: Secondary | ICD-10-CM | POA: Insufficient documentation

## 2014-08-16 DIAGNOSIS — R197 Diarrhea, unspecified: Secondary | ICD-10-CM | POA: Insufficient documentation

## 2014-08-16 DIAGNOSIS — K219 Gastro-esophageal reflux disease without esophagitis: Secondary | ICD-10-CM | POA: Insufficient documentation

## 2014-08-16 DIAGNOSIS — E1142 Type 2 diabetes mellitus with diabetic polyneuropathy: Secondary | ICD-10-CM | POA: Insufficient documentation

## 2014-08-16 DIAGNOSIS — M109 Gout, unspecified: Secondary | ICD-10-CM | POA: Insufficient documentation

## 2014-08-16 DIAGNOSIS — M542 Cervicalgia: Secondary | ICD-10-CM | POA: Insufficient documentation

## 2014-08-16 DIAGNOSIS — L03119 Cellulitis of unspecified part of limb: Secondary | ICD-10-CM | POA: Insufficient documentation

## 2014-08-16 DIAGNOSIS — J449 Chronic obstructive pulmonary disease, unspecified: Secondary | ICD-10-CM | POA: Insufficient documentation

## 2014-08-16 DIAGNOSIS — E785 Hyperlipidemia, unspecified: Secondary | ICD-10-CM | POA: Insufficient documentation

## 2014-08-16 DIAGNOSIS — M5416 Radiculopathy, lumbar region: Secondary | ICD-10-CM | POA: Insufficient documentation

## 2014-08-16 DIAGNOSIS — M25561 Pain in right knee: Secondary | ICD-10-CM

## 2014-08-16 DIAGNOSIS — F324 Major depressive disorder, single episode, in partial remission: Secondary | ICD-10-CM | POA: Insufficient documentation

## 2014-08-16 DIAGNOSIS — F4001 Agoraphobia with panic disorder: Secondary | ICD-10-CM | POA: Insufficient documentation

## 2014-08-16 MED ORDER — ESCITALOPRAM OXALATE 20 MG PO TABS: 20.0000 mg | ORAL_TABLET | Freq: Every day | ORAL | Status: DC

## 2014-08-16 MED ORDER — FUROSEMIDE 20 MG PO TABS: 20.0000 mg | ORAL_TABLET | Freq: Every day | ORAL | Status: DC

## 2014-08-16 NOTE — Progress Notes (Signed)
Name: Tricia [name]   MRN: [id number]    DOB: 08/14/1960   Date:08/16/2014       Progress Note  Subjective  Chief Complaint  Chief Complaint  Patient presents with  . Follow-up    A1c, leg swelling, depression med    Diabetes She presents for her follow-up diabetic visit. She has type 2 diabetes mellitus. Her disease course has been improving (HgbA1c 8.9%). Associated symptoms include fatigue and foot paresthesias. Pertinent negatives for diabetes include no chest pain, no polydipsia and no polyuria. Current diabetic treatment includes oral agent (monotherapy) and intensive insulin program. She is following a diabetic diet. Her breakfast blood glucose range is generally 90-110 mg/dl.  Congestive Heart Failure Presents for follow-up visit. Associated symptoms include edema and fatigue. Pertinent negatives include no chest pain, chest pressure, muscle weakness, palpitations or shortness of breath.  Leg Swelling: Bilateral leg swelling present for 2 weeks. She has history of lower extremity edema and has been on Lasix in the past. She wishes to be restarted back on Lasix. She has been diagnosed with diastolic dysfunction on an echocardiogram performed at [facility] in September 2014.    Past Medical History  Diagnosis Date  . Anxiety   . Depression   . Diabetes mellitus without complication   . Hypertension   . Hyperlipidemia     History reviewed. No pertinent past surgical history.  History reviewed. No pertinent family history.  History   Social History  . Marital Status: Divorced    Spouse Name: N/A  . Number of Children: N/A  . Years of Education: N/A   Occupational History  . Not on file.   Social History Main Topics  . Smoking status: Current Every Day Smoker  . Smokeless tobacco: Not on file  . Alcohol Use: No  . Drug Use: No  . Sexual Activity: Not on file   Other Topics Concern  . Not on file   Social History Narrative  . No narrative on file      Current outpatient prescriptions:  .  albuterol (VENTOLIN HFA) 108 (90 BASE) MCG/ACT inhaler, Inhale into the lungs., Disp: , Rfl:  .  ALPRAZolam (XANAX) 1 MG tablet, Take 1 tablet by mouth 2 (two) times daily., Disp: , Rfl:  .  baclofen (LIORESAL) 10 MG tablet, Take 10 mg by mouth 3 (three) times daily., Disp: , Rfl:  .  escitalopram (LEXAPRO) 20 MG tablet, Take 1 tablet (20 mg total) by mouth daily., Disp: 30 tablet, Rfl: 2 .  gabapentin (NEURONTIN) 600 MG tablet, Take 1 tablet by mouth 3 (three) times daily., Disp: , Rfl:  .  glucose blood test strip, , Disp: , Rfl:  .  Insulin Glargine (LANTUS SOLOSTAR) 100 UNIT/ML Solostar Pen, Inject into the skin., Disp: , Rfl:  .  ipratropium-albuterol (DUONEB) 0.5-2.5 (3) MG/3ML SOLN, Inhale into the lungs., Disp: , Rfl:  .  metFORMIN (GLUCOPHAGE) 1000 MG tablet, Take by mouth., Disp: , Rfl:  .  montelukast (SINGULAIR) 10 MG tablet, Take by mouth., Disp: , Rfl:  .  Oxycodone HCl 20 MG TABS, Take by mouth., Disp: , Rfl:  .  promethazine (PHENERGAN) 25 MG tablet, Take by mouth., Disp: , Rfl:  .  rizatriptan (MAXALT) 5 MG tablet, Take by mouth., Disp: , Rfl:  .  tiotropium (SPIRIVA HANDIHALER) 18 MCG inhalation capsule, Place into inhaler and inhale., Disp: , Rfl:  .  topiramate (TOPAMAX) 50 MG tablet, Take by mouth 3 (three) times daily., Disp: ,  Rfl:  .  furosemide (LASIX) 20 MG tablet, Take 1 tablet (20 mg total) by mouth daily., Disp: 30 tablet, Rfl: 3  Allergies  Allergen Reactions  . Penicillins      Review of Systems  Constitutional: Positive for fatigue.  Respiratory: Negative for shortness of breath.   Cardiovascular: Negative for chest pain and palpitations.  Musculoskeletal: Negative for muscle weakness.  Endo/Heme/Allergies: Negative for polydipsia.     Objective  Filed Vitals:   08/16/14 1558  BP: 100/60  Pulse: 95  Temp: 97.4 F (36.3 C)  TempSrc: Oral  Height: 5\' 9"  (1.753 m)  Weight: 168 lb 11.2 oz (76.522 kg)   SpO2: 93%    Physical Exam  Constitutional: She is oriented to person, place, and time and well-developed, well-nourished, and in no distress.  HENT:  Head: Normocephalic and atraumatic.  Cardiovascular: Normal rate and regular rhythm.   Pulmonary/Chest: Effort normal and breath sounds normal.  Musculoskeletal: She exhibits edema.       Right ankle: She exhibits swelling.       Left ankle: She exhibits swelling.  Neurological: She is alert and oriented to person, place, and time.  Skin: Skin is warm and dry.  Psychiatric: Affect and judgment normal.  Nursing note and vitals reviewed.      No results found for this or any previous visit (from the past 2160 hour(s)).   Assessment & Plan 1. Bilateral leg edema Patient will be restarted back on Lasix 20 mg daily. She will be referred to cardiology for a repeat echocardiogram for follow-up of diastolic dysfunction - furosemide (LASIX) 20 MG tablet; Take 1 tablet (20 mg total) by mouth daily.  Dispense: 30 tablet; Refill: 3 - Ambulatory referral to Cardiology  2. Major depressive disorder, recurrent episode, in partial remission We will increase Lexapro to 20 mg daily for optimal treatment of symptoms of depression and follow-up in 2 months. - escitalopram (LEXAPRO) 20 MG tablet; Take 1 tablet (20 mg total) by mouth daily.  Dispense: 30 tablet; Refill: 2  3. Diabetes mellitus type 2, insulin dependent A1c is improved from 9.9% to 8.9%. She has also lost a substantial amount of weight recently. We will leave her diabetes therapy (Metformin and Lantus) unchanged at this time. Patient will follow-up in 3 months to repeat A1c  There are no diagnoses linked to this encounter.  Brantleigh Mifflin Asad A. [facility] 08/16/2014 7:03 PM

## 2014-08-18 ENCOUNTER — Telehealth: Payer: Self-pay | Admitting: *Deleted

## 2014-08-18 NOTE — Telephone Encounter (Signed)
lmov to see when patient can call back and sch apt from referral from Dr Trena Platt office.

## 2014-08-19 ENCOUNTER — Ambulatory Visit
Admission: RE | Admit: 2014-08-19 | Discharge: 2014-08-19 | Disposition: A | Discharge status: Home / Self Care | Payer: BLUE CROSS/BLUE SHIELD | Source: Ambulatory Visit | Attending: Orthopedic Surgery | Admitting: Orthopedic Surgery

## 2014-08-19 DIAGNOSIS — M65861 Other synovitis and tenosynovitis, right lower leg: Secondary | ICD-10-CM | POA: Diagnosis not present

## 2014-08-19 DIAGNOSIS — S86811A Strain of other muscle(s) and tendon(s) at lower leg level, right leg, initial encounter: Secondary | ICD-10-CM | POA: Insufficient documentation

## 2014-08-19 DIAGNOSIS — M25561 Pain in right knee: Secondary | ICD-10-CM | POA: Diagnosis present

## 2014-08-19 DIAGNOSIS — S83281A Other tear of lateral meniscus, current injury, right knee, initial encounter: Secondary | ICD-10-CM | POA: Insufficient documentation

## 2014-08-19 DIAGNOSIS — M25461 Effusion, right knee: Secondary | ICD-10-CM | POA: Diagnosis not present

## 2014-08-19 MED ORDER — GADOBENATE DIMEGLUMINE 529 MG/ML IV SOLN
15.0000 mL | Freq: Once | INTRAVENOUS | Status: AC | PRN
  Administered 2014-08-19: 15 mL via INTRAVENOUS

## 2014-08-20 ENCOUNTER — Other Ambulatory Visit: Payer: Self-pay

## 2014-08-24 ENCOUNTER — Telehealth: Payer: Self-pay | Admitting: Family Medicine

## 2014-08-24 NOTE — Telephone Encounter (Signed)
Pt is completely out on alprazalam. Requesting refill to be sent to [facility]

## 2014-08-27 ENCOUNTER — Other Ambulatory Visit: Payer: Self-pay | Admitting: Family Medicine

## 2014-08-27 MED ORDER — ALPRAZOLAM 1 MG PO TABS: 1.0000 mg | ORAL_TABLET | Freq: Two times a day (BID) | ORAL | Status: DC

## 2014-08-27 NOTE — Telephone Encounter (Signed)
Refilled Medication for 2 months patient has appt on 10/18/14. Will call patient once RX has been signed.

## 2014-08-30 NOTE — Telephone Encounter (Signed)
Patient did receive her alprazolam prescription, however, she only received 30 pills but normally receive 60 pills.

## 2014-08-30 NOTE — Telephone Encounter (Signed)
I'm not there anymore, may want to forward to the new CMA.  Hope you have a great day!

## 2014-09-10 ENCOUNTER — Other Ambulatory Visit: Payer: Self-pay | Admitting: Family Medicine

## 2014-09-10 DIAGNOSIS — F32A Depression, unspecified: Secondary | ICD-10-CM

## 2014-09-10 DIAGNOSIS — F329 Major depressive disorder, single episode, unspecified: Secondary | ICD-10-CM

## 2014-09-10 DIAGNOSIS — F419 Anxiety disorder, unspecified: Principal | ICD-10-CM

## 2014-09-10 MED ORDER — ALPRAZOLAM 1 MG PO TABS: 1.0000 mg | ORAL_TABLET | Freq: Two times a day (BID) | ORAL | Status: DC | PRN

## 2014-09-10 NOTE — Telephone Encounter (Signed)
[facility] is requesting refill for Controlled Substance alprazolam 1 MG Tab, It was filled on 08/27/2014 with 1 refill but pharmacy is just requesting refills for next dispense.

## 2014-09-13 NOTE — Telephone Encounter (Signed)
Spoke with patient and this has been taken care of.

## 2014-09-13 NOTE — Telephone Encounter (Signed)
Do you know if this has been taken care of?

## 2014-09-23 ENCOUNTER — Other Ambulatory Visit: Payer: Self-pay | Admitting: Family Medicine

## 2014-09-23 DIAGNOSIS — F419 Anxiety disorder, unspecified: Principal | ICD-10-CM

## 2014-09-23 DIAGNOSIS — F32A Depression, unspecified: Secondary | ICD-10-CM

## 2014-09-23 DIAGNOSIS — F329 Major depressive disorder, single episode, unspecified: Secondary | ICD-10-CM

## 2014-09-23 NOTE — Telephone Encounter (Signed)
Patient is requesting a refill on alprazolam 1mg  1 tablet twice a day.  Patient stated that she will run out on Sunday morning (09/26/14).

## 2014-09-23 NOTE — Telephone Encounter (Signed)
Medication has been refilled.

## 2014-09-24 ENCOUNTER — Telehealth: Payer: Self-pay | Admitting: Family Medicine

## 2014-09-24 ENCOUNTER — Other Ambulatory Visit: Payer: Self-pay | Admitting: Neurology

## 2014-09-24 DIAGNOSIS — M542 Cervicalgia: Secondary | ICD-10-CM

## 2014-09-24 DIAGNOSIS — R2689 Other abnormalities of gait and mobility: Secondary | ICD-10-CM

## 2014-09-24 NOTE — Telephone Encounter (Signed)
Patient is requesting a refill on alprazolam. Requesting to pick this up today if possible.

## 2014-09-24 NOTE — Telephone Encounter (Signed)
Left voicemail for patient to come to office to pick up Prescription for Medication it has been ready for pickup since 09/10/2014, patient was notified on that date that she could come by and pick up with photo Id patient verbalized understanding.

## 2014-09-29 ENCOUNTER — Telehealth: Payer: Self-pay

## 2014-09-29 ENCOUNTER — Ambulatory Visit
Admission: RE | Admit: 2014-09-29 | Discharge: 2014-09-29 | Disposition: A | Discharge status: Home / Self Care | Payer: BLUE CROSS/BLUE SHIELD | Source: Ambulatory Visit | Attending: Neurology | Admitting: Neurology

## 2014-09-29 DIAGNOSIS — M5022 Other cervical disc displacement, mid-cervical region: Secondary | ICD-10-CM | POA: Insufficient documentation

## 2014-09-29 DIAGNOSIS — M5382 Other specified dorsopathies, cervical region: Secondary | ICD-10-CM | POA: Diagnosis not present

## 2014-09-29 DIAGNOSIS — M5030 Other cervical disc degeneration, unspecified cervical region: Secondary | ICD-10-CM | POA: Insufficient documentation

## 2014-09-29 DIAGNOSIS — M47892 Other spondylosis, cervical region: Secondary | ICD-10-CM | POA: Diagnosis not present

## 2014-09-29 DIAGNOSIS — M542 Cervicalgia: Secondary | ICD-10-CM

## 2014-09-29 DIAGNOSIS — R413 Other amnesia: Secondary | ICD-10-CM | POA: Insufficient documentation

## 2014-09-29 DIAGNOSIS — M4802 Spinal stenosis, cervical region: Secondary | ICD-10-CM | POA: Diagnosis not present

## 2014-09-29 DIAGNOSIS — R2689 Other abnormalities of gait and mobility: Secondary | ICD-10-CM | POA: Diagnosis present

## 2014-09-29 NOTE — Telephone Encounter (Signed)
Request from [place] , sent to [place] on 09/29/2014 .

## 2014-10-04 ENCOUNTER — Ambulatory Visit (INDEPENDENT_AMBULATORY_CARE_PROVIDER_SITE_OTHER): Payer: BLUE CROSS/BLUE SHIELD | Admitting: Cardiovascular Disease

## 2014-10-04 ENCOUNTER — Encounter: Payer: Self-pay | Admitting: Cardiovascular Disease

## 2014-10-04 VITALS — BP 112/80 | HR 66 | Ht 69.0 in | Wt 167.2 lb

## 2014-10-04 DIAGNOSIS — I10 Essential (primary) hypertension: Secondary | ICD-10-CM

## 2014-10-04 DIAGNOSIS — I5032 Chronic diastolic (congestive) heart failure: Secondary | ICD-10-CM | POA: Diagnosis not present

## 2014-10-04 DIAGNOSIS — R0602 Shortness of breath: Secondary | ICD-10-CM

## 2014-10-04 DIAGNOSIS — I739 Peripheral vascular disease, unspecified: Secondary | ICD-10-CM

## 2014-10-04 DIAGNOSIS — I872 Venous insufficiency (chronic) (peripheral): Secondary | ICD-10-CM

## 2014-10-04 NOTE — Patient Instructions (Addendum)
Medication Instructions:  Your physician recommends that you continue on your current medications as directed. Please refer to the Current Medication list given to you today.   Labwork: Your physician recommends that you have labs today: BMET, BNP, TSH   Testing/Procedures: Your physician has requested that you have an echocardiogram. Echocardiography is a painless test that uses sound waves to create images of your heart. It provides your doctor with information about the size and shape of your heart and how well your heart's chambers and valves are working. This procedure takes approximately one hour. There are no restrictions for this procedure.  Your physician has requested that you have a lexiscan myoview.   [place]  Your caregiver has ordered a Stress Test with nuclear imaging. The purpose of this test is to evaluate the blood supply to your heart muscle. This procedure is referred to as a "Non-Invasive Stress Test." This is because other than having an IV started in your vein, nothing is inserted or "invades" your body. Cardiac stress tests are done to find areas of poor blood flow to the heart by determining the extent of coronary artery disease (CAD). Some patients exercise on a treadmill, which naturally increases the blood flow to your heart, while others who are  unable to walk on a treadmill due to physical limitations have a pharmacologic/chemical stress agent called Lexiscan . This medicine will mimic walking on a treadmill by temporarily increasing your coronary blood flow.   Please note: these test may take anywhere between 2-4 hours to complete  PLEASE REPORT TO [facility] AT THE FIRST DESK WILL DIRECT YOU WHERE TO GO  Date of Procedure:Monday, August 22, 9:00am Arrival Time for Procedure: 8:30am  Instructions regarding medication:   ___xx_ : Hold diabetes medication night before and morning of procedure  XX: DO NOT TAKE METFORMIN 24  HOURS BEFORE OR 48 HOURS AFTER PROCEDURE  __xx__:  Hold other medications as follows: lasix  PLEASE NOTIFY THE OFFICE AT LEAST 24 HOURS IN ADVANCE IF YOU ARE UNABLE TO KEEP YOUR APPOINTMENT.  [phone] AND  PLEASE NOTIFY NUCLEAR MEDICINE AT [facility] AT LEAST 24 HOURS IN ADVANCE IF YOU ARE UNABLE TO KEEP YOUR APPOINTMENT. [phone]  How to prepare for your Myoview test:   Do not eat or drink after midnight  No caffeine for 24 hours prior to test  No smoking 24 hours prior to test.  Your medication may be taken with water.  If your doctor stopped a medication because of this test, do not take that medication.  Ladies, please do not wear dresses.  Skirts or pants are appropriate. Please wear a short sleeve shirt.  No perfume, cologne or lotion.  Wear comfortable walking shoes. No heels!          Your physician has requested that you have a lower extremity arterial exercise duplex. During this test, exercise and ultrasound are used to evaluate arterial blood flow in the legs. Allow one hour for this exam. There are no restrictions or special instructions.   Follow-Up: Your physician recommends that you schedule a follow-up appointment in: one month with Dr. Fletcher Anon.    Any Other Special Instructions Will Be Listed Below (If Applicable).  Echocardiogram An echocardiogram, or echocardiography, uses sound waves (ultrasound) to produce an image of your heart. The echocardiogram is simple, painless, obtained within a short period of time, and offers valuable information to your health care provider. The images from an echocardiogram can provide  information such as:  Evidence of coronary artery disease (CAD).  Heart size.  Heart muscle function.  Heart valve function.  Aneurysm detection.  Evidence of a past heart attack.  Fluid buildup around the heart.  Heart muscle thickening.  Assess heart valve function. LET [facility] CARE PROVIDER KNOW ABOUT:  Any allergies  you have.  All medicines you are taking, including vitamins, herbs, eye drops, creams, and over-the-counter medicines.  Previous problems you or members of your family have had with the use of anesthetics.  Any blood disorders you have.  Previous surgeries you have had.  Medical conditions you have.  Possibility of pregnancy, if this applies. BEFORE THE PROCEDURE  No special preparation is needed. Eat and drink normally.  PROCEDURE   In order to produce an image of your heart, gel will be applied to your chest and a wand-like tool (transducer) will be moved over your chest. The gel will help transmit the sound waves from the transducer. The sound waves will harmlessly bounce off your heart to allow the heart images to be captured in real-time motion. These images will then be recorded.  You may need an IV to receive a medicine that improves the quality of the pictures. AFTER THE PROCEDURE You may return to your normal schedule including diet, activities, and medicines, unless your health care provider tells you otherwise. Document Released: 02/03/2000 Document Revised: 06/22/2013 Document Reviewed: 10/13/2012 [facility] Patient Information 2015 [place], [place]. This information is not intended to replace advice given to you by your health care provider. Make sure you discuss any questions you have with your health care provider. Nuclear Medicine Exam A nuclear medicine exam is a safe and painless imaging test. It helps to detect and diagnose disease in the body as well as provide information about organ function and structure.  Nuclear scans are most often done of the:  Lungs.  Heart.  Thyroid gland.  Bones.  Abdomen. HOW A NUCLEAR MEDICINE EXAM WORKS A nuclear medicine exam works by using a radioactive tracer. The material is given either by an IV (intravenous) injection or it may be swallowed. After the tracer is in the body, it is absorbed by your body's organs. A large  scanning machine that uses a special camera detects the radioactivity in your body. A computerized image is then formed regarding the area of concern. The small amounts of radioactive material used in a nuclear medicine exam are found to be medically safe. However, because radioactive material is used, this test is not done if you are pregnant or nursing.  BEFORE THE PROCEDURE  If available, bring previous imaging studies such as x-rays, etc. with you to the exam.  Arrive early for your exam. PROCEDURE  An IV may be started before the exam begins.  Depending on the type of examination, will lie on a table or sit in a chair during the exam.  The nuclear medicine exam will take about 30 to 60 minutes to complete. AFTER THE PROCEDURE  After your scan is completed, the image(s) will be evaluated by a specialist. It is important that you follow up with your caregiver to find out your test results.  You may return to your regular activity as instructed by your caregiver. SEEK IMMEDIATE MEDICAL CARE IF: You have shortness of breath or difficulty breathing. MAKE SURE YOU:   Understand these instructions.  Will watch your condition.  Will get help right away if you are not doing well or get worse. Document Released:  03/15/2004 Document Revised: 04/30/2011 Document Reviewed: 04/29/2008 [employer] Patient Information 2015 [place], [place]. This information is not intended to replace advice given to you by your health care provider. Make sure you discuss any questions you have with your health care provider.

## 2014-10-05 DIAGNOSIS — I872 Venous insufficiency (chronic) (peripheral): Secondary | ICD-10-CM | POA: Insufficient documentation

## 2014-10-05 NOTE — Assessment & Plan Note (Signed)
Blood pressure is well controlled on current medications. 

## 2014-10-05 NOTE — Assessment & Plan Note (Signed)
I suspect that some of her leg edema is likely due to chronic venous insufficiency with chronic stasis dermatitis. I advised her to elevate her legs frequently during the day.

## 2014-10-05 NOTE — Progress Notes (Signed)
Primary care physician: Dr. Lorin Glass  HPI  This is a 54 year old female who was referred for evaluation of congestive heart failure. She reports being diagnosed with this at [facility] in 2014. She had an echocardiogram done at that time which showed an ejection fraction of 82-99%, grade 1 diastolic dysfunction with no significant valvular abnormalities. She has chronic medical conditions that include type 2 diabetes, hypertension, tobacco use and possible COPD. She also reports chronic tremors and bad balance which is being evaluated by neurology. She reports recent worsening of leg edema and was started on Lasix 20 mg once daily. She increased the dose to 40 mg once daily given the suboptimal response. The edema improved. She complains of dyspnea with moderate activities without chest pain. There is no family history of coronary artery disease. She smokes half a pack per day.  Allergies  Allergen Reactions  . Augmentin [Amoxicillin-Pot Clavulanate]   . Penicillins      Current Outpatient Prescriptions on File Prior to Visit  Medication Sig Dispense Refill  . albuterol (VENTOLIN HFA) 108 (90 BASE) MCG/ACT inhaler Inhale into the lungs.    . ALPRAZolam (XANAX) 1 MG tablet Take 1 tablet (1 mg total) by mouth 2 (two) times daily as needed for anxiety. 60 tablet 0  . baclofen (LIORESAL) 10 MG tablet Take 10 mg by mouth 3 (three) times daily.    Marland Kitchen escitalopram (LEXAPRO) 20 MG tablet Take 1 tablet (20 mg total) by mouth daily. 30 tablet 2  . gabapentin (NEURONTIN) 600 MG tablet Take 1 tablet by mouth 3 (three) times daily.    Marland Kitchen glucose blood test strip     . Insulin Glargine (LANTUS SOLOSTAR) 100 UNIT/ML Solostar Pen Inject 20 Units into the skin at bedtime and may repeat dose one time if needed.     Marland Kitchen ipratropium-albuterol (DUONEB) 0.5-2.5 (3) MG/3ML SOLN Inhale into the lungs.    . metFORMIN (GLUCOPHAGE) 1000 MG tablet Take 1,000 mg by mouth 2 (two) times daily with a meal.     . montelukast  (SINGULAIR) 10 MG tablet Take 10 mg by mouth at bedtime.     . Oxycodone HCl 20 MG TABS Take 40 mg by mouth 2 (two) times daily.     . promethazine (PHENERGAN) 25 MG tablet Take 25 mg by mouth as needed.     . rizatriptan (MAXALT) 5 MG tablet Take 5 mg by mouth as needed.     . tiotropium (SPIRIVA HANDIHALER) 18 MCG inhalation capsule Place into inhaler and inhale.    . topiramate (TOPAMAX) 50 MG tablet Take by mouth 3 (three) times daily.     No current facility-administered medications on file prior to visit.     Past Medical History  Diagnosis Date  . Anxiety   . Depression   . Diabetes mellitus without complication   . Hypertension   . Hyperlipidemia   . Diastolic dysfunction   . Asthma   . Cholesteatoma of left ear     x2     Past Surgical History  Procedure Laterality Date  . Dilation and curettage of uterus    . Tubal ligation    . Carpal tunnel release    . External ear surgery       Family History  Problem Relation Age of Onset  . Emphysema Mother   . Stroke Father   . Throat cancer Father      Social History   Social History  . Marital Status: Divorced  Spouse Name: N/A  . Number of Children: N/A  . Years of Education: N/A   Occupational History  . Not on file.   Social History Main Topics  . Smoking status: Current Every Day Smoker -- 0.50 packs/day for 38 years    Types: Cigarettes  . Smokeless tobacco: Not on file  . Alcohol Use: No  . Drug Use: No  . Sexual Activity: Not on file   Other Topics Concern  . Not on file   Social History Narrative     ROS A 10 point review of system was performed. It is negative other than that mentioned in the history of present illness.   PHYSICAL EXAM   BP 112/80 mmHg  Pulse 66  Ht 5\' 9"  (1.753 m)  Wt 167 lb 4 oz (75.864 kg)  BMI 24.69 kg/m2 Constitutional: She is oriented to person, place, and time. She appears well-developed and well-nourished. No distress.  HENT: No nasal discharge.    Head: Normocephalic and atraumatic.  Eyes: Pupils are equal and round. No discharge.  Neck: Normal range of motion. Neck supple. No JVD present. No thyromegaly present.  Cardiovascular: Normal rate, regular rhythm, normal heart sounds. Exam reveals no gallop and no friction rub. No murmur heard.  Pulmonary/Chest: Effort normal and breath sounds normal. No stridor. No respiratory distress. She has no wheezes. She has no rales. She exhibits no tenderness.  Abdominal: Soft. Bowel sounds are normal. She exhibits no distension. There is no tenderness. There is no rebound and no guarding.  Musculoskeletal: Normal range of motion. She exhibits +1 edema and no tenderness.  Neurological: She is alert and oriented to person, place, and time. Coordination normal.  Skin: Skin is warm and dry. Chronic stasis dermatitis. She is not diaphoretic. No erythema. No pallor.  Psychiatric: She has a normal mood and affect. Her behavior is normal. Judgment and thought content normal.     TSV:XBLTJ  Rhythm  -  Nonspecific T-abnormality.   ABNORMAL     ASSESSMENT AND PLAN

## 2014-10-05 NOTE — Assessment & Plan Note (Signed)
The patient reports some improvement with current dose of furosemide 40 mg once daily. Given worsening symptoms of dyspnea and leg edema, I requested an echocardiogram for evaluation. I also requested labs that include basic metabolic profile, BNP and TSH. She reports significant exertional dyspnea and given multiple risk factors for coronary artery disease, I requested a pharmacologic nuclear stress test. She is not able to exercise on a treadmill.

## 2014-10-06 LAB — BASIC METABOLIC PANEL
BUN / CREAT RATIO: 10 (ref 9–23)
BUN: 9 mg/dL (ref 6–24)
CO2: 17 mmol/L — ABNORMAL LOW (ref 18–29)
CREATININE: 0.92 mg/dL (ref 0.57–1.00)
Calcium: 8.8 mg/dL (ref 8.7–10.2)
Chloride: 103 mmol/L (ref 97–108)
GFR calc non Af Amer: 71 mL/min/{1.73_m2} (ref >59)
GFR, EST AFRICAN AMERICAN: 82 mL/min/{1.73_m2} (ref >59)
Glucose: 120 mg/dL — ABNORMAL HIGH (ref 65–99)
Potassium: 4 mmol/L (ref 3.5–5.2)
Sodium: 140 mmol/L (ref 134–144)

## 2014-10-06 LAB — BRAIN NATRIURETIC PEPTIDE: BNP: 11.3 pg/mL (ref 0.0–100.0)

## 2014-10-06 LAB — TSH: TSH: 1.38 u[IU]/mL (ref 0.450–4.500)

## 2014-10-08 ENCOUNTER — Other Ambulatory Visit: Payer: Self-pay

## 2014-10-11 ENCOUNTER — Encounter
Admission: RE | Admit: 2014-10-11 | Discharge: 2014-10-11 | Disposition: A | Discharge status: Home / Self Care | Payer: BLUE CROSS/BLUE SHIELD | Source: Ambulatory Visit | Attending: Cardiovascular Disease | Admitting: Cardiovascular Disease

## 2014-10-11 ENCOUNTER — Telehealth: Payer: Self-pay | Admitting: Cardiovascular Disease

## 2014-10-11 DIAGNOSIS — R0602 Shortness of breath: Secondary | ICD-10-CM

## 2014-10-11 LAB — NM MYOCAR MULTI W/SPECT W/WALL MOTION / EF
CSEPHR: 46 %
LV sys vol: 1 mL
Peak HR: 78 {beats}/min
Rest HR: 61 {beats}/min

## 2014-10-11 MED ORDER — REGADENOSON 0.4 MG/5ML IV SOLN
0.4000 mg | Freq: Once | INTRAVENOUS | Status: AC
  Administered 2014-10-11: 0.4 mg via INTRAVENOUS
  Filled 2014-10-11: qty 5

## 2014-10-11 MED ORDER — TECHNETIUM TC 99M SESTAMIBI - CARDIOLITE
14.2620 | Freq: Once | INTRAVENOUS | Status: AC | PRN
  Administered 2014-10-11: 09:00:00 14.262 via INTRAVENOUS

## 2014-10-11 MED ORDER — TECHNETIUM TC 99M SESTAMIBI - CARDIOLITE
32.8010 | Freq: Once | INTRAVENOUS | Status: AC | PRN
  Administered 2014-10-11: 32.801 via INTRAVENOUS

## 2014-10-11 NOTE — Telephone Encounter (Signed)
Patient request records sent to lawyer and insurance company.  Patient given release forms and sh will bring them back when complete.

## 2014-10-18 ENCOUNTER — Ambulatory Visit (INDEPENDENT_AMBULATORY_CARE_PROVIDER_SITE_OTHER): Payer: BLUE CROSS/BLUE SHIELD | Admitting: Family Medicine

## 2014-10-18 ENCOUNTER — Encounter: Payer: Self-pay | Admitting: Family Medicine

## 2014-10-18 VITALS — BP 112/73 | HR 82 | Temp 97.6°F | Resp 18 | Ht 69.0 in | Wt 164.2 lb

## 2014-10-18 DIAGNOSIS — F32A Depression, unspecified: Secondary | ICD-10-CM

## 2014-10-18 DIAGNOSIS — E785 Hyperlipidemia, unspecified: Secondary | ICD-10-CM

## 2014-10-18 DIAGNOSIS — F418 Other specified anxiety disorders: Secondary | ICD-10-CM

## 2014-10-18 DIAGNOSIS — E119 Type 2 diabetes mellitus without complications: Secondary | ICD-10-CM

## 2014-10-18 DIAGNOSIS — F329 Major depressive disorder, single episode, unspecified: Secondary | ICD-10-CM

## 2014-10-18 DIAGNOSIS — F419 Anxiety disorder, unspecified: Secondary | ICD-10-CM

## 2014-10-18 DIAGNOSIS — Z794 Long term (current) use of insulin: Secondary | ICD-10-CM

## 2014-10-18 MED ORDER — ALPRAZOLAM 1 MG PO TABS: 1.0000 mg | ORAL_TABLET | Freq: Two times a day (BID) | ORAL | Status: DC | PRN

## 2014-10-18 NOTE — Progress Notes (Signed)
Name: Tricia [name]   MRN: [id number]    DOB: 11-06-1960   Date:10/18/2014       Progress Note  Subjective  Chief Complaint  Chief Complaint  Patient presents with  . Follow-up    3 mo  . Hypertension  . Diabetes  . Hyperlipidemia    Diabetes She presents for her follow-up diabetic visit. She has type 2 diabetes mellitus. Her disease course has been improving. Hypoglycemia symptoms include nervousness/anxiousness. Associated symptoms include fatigue. Pertinent negatives for diabetes include no chest pain, no foot paresthesias, no polydipsia and no polyuria. Diabetic complications include peripheral neuropathy. Current diabetic treatment includes oral agent (monotherapy) and intensive insulin program. Her weight is stable. She is following a diabetic diet. Her breakfast blood glucose is taken between 8-9 am. Her breakfast blood glucose range is generally 110-130 mg/dl.  Hyperlipidemia This is a chronic problem. Recent lipid tests were reviewed and are normal. Pertinent negatives include no chest pain. Current antihyperlipidemic treatment includes statins.  Anxiety Presents for follow-up visit. Symptoms include nervous/anxious behavior, palpitations, panic and restlessness. Patient reports no chest pain.   Past treatments include benzodiazephines. Compliance with prior treatments has been good.    Past Medical History  Diagnosis Date  . Anxiety   . Depression   . Diabetes mellitus without complication   . Hypertension   . Hyperlipidemia   . Diastolic dysfunction   . Asthma   . Cholesteatoma of left ear     x2    Past Surgical History  Procedure Laterality Date  . Dilation and curettage of uterus    . Tubal ligation    . Carpal tunnel release    . External ear surgery      Family History  Problem Relation Age of Onset  . Emphysema Mother   . Stroke Father   . Throat cancer Father     Social History   Social History  . Marital Status: Divorced    Spouse  Name: N/A  . Number of Children: N/A  . Years of Education: N/A   Occupational History  . Not on file.   Social History Main Topics  . Smoking status: Current Every Day Smoker -- 0.50 packs/day for 38 years    Types: Cigarettes  . Smokeless tobacco: Not on file  . Alcohol Use: No  . Drug Use: No  . Sexual Activity: Not on file   Other Topics Concern  . Not on file   Social History Narrative     Current outpatient prescriptions:  .  albuterol (VENTOLIN HFA) 108 (90 BASE) MCG/ACT inhaler, Inhale into the lungs., Disp: , Rfl:  .  ALPRAZolam (XANAX) 1 MG tablet, Take 1 tablet (1 mg total) by mouth 2 (two) times daily as needed for anxiety., Disp: 60 tablet, Rfl: 0 .  baclofen (LIORESAL) 10 MG tablet, Take 10 mg by mouth 3 (three) times daily., Disp: , Rfl:  .  escitalopram (LEXAPRO) 20 MG tablet, Take 1 tablet (20 mg total) by mouth daily., Disp: 30 tablet, Rfl: 2 .  furosemide (LASIX) 40 MG tablet, Take 40 mg by mouth daily., Disp: , Rfl:  .  gabapentin (NEURONTIN) 600 MG tablet, Take 1 tablet by mouth 3 (three) times daily., Disp: , Rfl:  .  glucose blood test strip, , Disp: , Rfl:  .  Insulin Glargine (LANTUS SOLOSTAR) 100 UNIT/ML Solostar Pen, Inject 20 Units into the skin at bedtime and may repeat dose one time if needed. , Disp: , Rfl:  .  ipratropium-albuterol (DUONEB) 0.5-2.5 (3) MG/3ML SOLN, Inhale into the lungs., Disp: , Rfl:  .  metFORMIN (GLUCOPHAGE) 1000 MG tablet, Take 1,000 mg by mouth 2 (two) times daily with a meal. , Disp: , Rfl:  .  montelukast (SINGULAIR) 10 MG tablet, Take 10 mg by mouth at bedtime. , Disp: , Rfl:  .  Oxycodone HCl 20 MG TABS, Take 40 mg by mouth 2 (two) times daily. , Disp: , Rfl:  .  promethazine (PHENERGAN) 25 MG tablet, Take 25 mg by mouth as needed. , Disp: , Rfl:  .  rizatriptan (MAXALT) 5 MG tablet, Take 5 mg by mouth as needed. , Disp: , Rfl:  .  tiotropium (SPIRIVA HANDIHALER) 18 MCG inhalation capsule, Place into inhaler and inhale.,  Disp: , Rfl:  .  topiramate (TOPAMAX) 50 MG tablet, Take by mouth 3 (three) times daily., Disp: , Rfl:   Allergies  Allergen Reactions  . Augmentin [Amoxicillin-Pot Clavulanate]   . Penicillins      Review of Systems  Constitutional: Positive for fatigue.  Cardiovascular: Positive for palpitations. Negative for chest pain.  Musculoskeletal: Positive for back pain.  Endo/Heme/Allergies: Negative for polydipsia.  Psychiatric/Behavioral: Positive for depression. The patient is nervous/anxious.      Objective  Filed Vitals:   10/18/14 1203  BP: 112/73  Pulse: 82  Temp: 97.6 F (36.4 C)  TempSrc: Oral  Resp: 18  Height: 5\' 9"  (1.753 m)  Weight: 164 lb 3.2 oz (74.481 kg)  SpO2: 94%    Physical Exam  Constitutional: She is oriented to person, place, and time and well-developed, well-nourished, and in no distress.  Cardiovascular: Normal rate and regular rhythm.   Pulmonary/Chest: Effort normal and breath sounds normal.  Neurological: She is alert and oriented to person, place, and time.  Psychiatric: Memory, affect and judgment normal.  Nursing note and vitals reviewed.   Assessment & Plan  1. Diabetes mellitus type 2, insulin dependent  A1c goal of 7%. Recheck and follow-up. - HgB A1c  2. Anxiety and depression  Symptoms are stable on present benzodiazepine therapy. Patient is aware of the dependence potential and the drug interactions of benzodiazepines, especially with opioids. Refills provided and follow-up in 3 months.  - ALPRAZolam (XANAX) 1 MG tablet; Take 1 tablet (1 mg total) by mouth 2 (two) times daily as needed for anxiety.  Dispense: 60 tablet; Refill: 2  3. Dyslipidemia  - Lipid Profile   Bracken Moffa Asad A[facility] 10/18/2014 12:17 PM

## 2014-10-19 ENCOUNTER — Ambulatory Visit (INDEPENDENT_AMBULATORY_CARE_PROVIDER_SITE_OTHER): Payer: BLUE CROSS/BLUE SHIELD

## 2014-10-19 ENCOUNTER — Other Ambulatory Visit: Payer: Self-pay

## 2014-10-19 DIAGNOSIS — R0602 Shortness of breath: Secondary | ICD-10-CM

## 2014-10-19 DIAGNOSIS — I739 Peripheral vascular disease, unspecified: Secondary | ICD-10-CM | POA: Diagnosis not present

## 2014-10-21 DIAGNOSIS — G629 Polyneuropathy, unspecified: Secondary | ICD-10-CM | POA: Insufficient documentation

## 2014-10-27 ENCOUNTER — Other Ambulatory Visit: Payer: Self-pay | Admitting: Family Medicine

## 2014-11-15 ENCOUNTER — Ambulatory Visit: Payer: BLUE CROSS/BLUE SHIELD | Admitting: Cardiovascular Disease

## 2014-11-20 ENCOUNTER — Other Ambulatory Visit: Payer: Self-pay | Admitting: Family Medicine

## 2014-11-23 ENCOUNTER — Encounter: Payer: Self-pay | Admitting: Emergency Medicine

## 2014-11-23 ENCOUNTER — Ambulatory Visit
Admission: EM | Admit: 2014-11-23 | Discharge: 2014-11-23 | Disposition: A | Discharge status: Home / Self Care | Payer: BLUE CROSS/BLUE SHIELD | Attending: Family Medicine | Admitting: Family Medicine

## 2014-11-23 DIAGNOSIS — G5621 Lesion of ulnar nerve, right upper limb: Secondary | ICD-10-CM | POA: Diagnosis not present

## 2014-11-23 MED ORDER — NAPROXEN 500 MG PO TABS: 500.0000 mg | ORAL_TABLET | Freq: Two times a day (BID) | ORAL | Status: DC

## 2014-11-23 NOTE — Discharge Instructions (Signed)

## 2014-11-23 NOTE — ED Notes (Signed)
Pt with right hand pain x days

## 2014-11-23 NOTE — ED Provider Notes (Signed)
CSN: [id number]     Arrival date & time 11/23/14  1146 History   First MD Initiated Contact with Patient 11/23/14 1226     Chief Complaint  Patient presents with  . Hand Pain   (Consider location/radiation/quality/duration/timing/severity/associated sxs/prior Treatment) HPI   This 54 year old female who presents with a 2 to three-day history of right (dominant) hand pain mostly on the ulnar aspect. There is no history of trauma and she does not remember any any increased activity with her hands. She states that she has pain at the wrist with extension into her little and ring fingers tingling of her middle finger. His had previous history of hand problems including a lateral carpal tunnel releases and what she describes as all or nerve damage her elbows. She been taking Advil but this has not been beneficial. Also on chronic oxycodone therapy for chronic pain. She is now on disability. Any motion of her wrist or fingers cause her to have pain. Denies any fever or chills. Denies any neck pain or any radiation into her upper arm not specifically complain of elbow pain today.  Past Medical History  Diagnosis Date  . Anxiety   . Depression   . Diabetes mellitus without complication ([place])   . Hypertension   . Hyperlipidemia   . Diastolic dysfunction   . Asthma   . Cholesteatoma of left ear     x2   Past Surgical History  Procedure Laterality Date  . Dilation and curettage of uterus    . Tubal ligation    . Carpal tunnel release    . External ear surgery     Family History  Problem Relation Age of Onset  . Emphysema Mother   . Stroke Father   . Throat cancer Father    Social History  Substance Use Topics  . Smoking status: Current Every Day Smoker -- 0.50 packs/day for 38 years    Types: Cigarettes  . Smokeless tobacco: None  . Alcohol Use: No   OB History    No data available     Review of Systems  Constitutional: Positive for activity change. Negative for fever, chills  and fatigue.  Musculoskeletal: Positive for myalgias and arthralgias.  All other systems reviewed and are negative.   Allergies  Augmentin and Penicillins  Home Medications   Prior to Admission medications   Medication Sig Start Date End Date Taking? Authorizing Provider  albuterol (VENTOLIN HFA) 108 (90 BASE) MCG/ACT inhaler Inhale into the lungs. 08/27/13   Historical Provider, MD  ALPRAZolam Duanne Moron) 1 MG tablet Take 1 tablet (1 mg total) by mouth 2 (two) times daily as needed for anxiety. 10/18/14   [name], MD  baclofen (LIORESAL) 10 MG tablet Take 10 mg by mouth 3 (three) times daily.    Historical Provider, MD  escitalopram (LEXAPRO) 20 MG tablet TAKE 1 TABLET BY MOUTH ONCE DAILY. 11/22/14   [name], MD  furosemide (LASIX) 40 MG tablet Take 40 mg by mouth daily.    Historical Provider, MD  gabapentin (NEURONTIN) 600 MG tablet Take 1 tablet by mouth 3 (three) times daily.    Historical Provider, MD  glucose blood test strip  11/24/13   Historical Provider, MD  Insulin Glargine (LANTUS SOLOSTAR) 100 UNIT/ML Solostar Pen Inject 20 Units into the skin at bedtime and may repeat dose one time if needed.  01/11/14   Historical Provider, MD  ipratropium-albuterol (DUONEB) 0.5-2.5 (3) MG/3ML SOLN Inhale into the lungs. 08/27/13  Historical Provider, MD  metFORMIN (GLUCOPHAGE) 1000 MG tablet Take 1,000 mg by mouth 2 (two) times daily with a meal.  12/10/13   Historical Provider, MD  montelukast (SINGULAIR) 10 MG tablet Take 10 mg by mouth at bedtime.  01/28/13   Historical Provider, MD  naproxen (NAPROSYN) 500 MG tablet Take 1 tablet (500 mg total) by mouth 2 (two) times daily with a meal. 11/23/14   Lorin Picket, PA-C  Oxycodone HCl 20 MG TABS Take 40 mg by mouth 2 (two) times daily.     Historical Provider, MD  promethazine (PHENERGAN) 25 MG tablet Take 25 mg by mouth as needed.  12/24/13   Historical Provider, MD  rizatriptan (MAXALT) 5 MG tablet Take 5 mg by mouth as needed.   01/22/14   Historical Provider, MD  tiotropium (SPIRIVA HANDIHALER) 18 MCG inhalation capsule Place into inhaler and inhale. 08/27/13   Historical Provider, MD  topiramate (TOPAMAX) 50 MG tablet Take by mouth 3 (three) times daily.    Historical Provider, MD   Meds Ordered and Administered this Visit  Medications - No data to display  BP 122/69 mmHg  Pulse 77  Temp(Src) 98.7 F (37.1 C) (Tympanic)  Resp 20  Ht 5' 5.5" (1.664 m)  Wt 169 lb (76.658 kg)  BMI 27.69 kg/m2  SpO2 99% No data found.   Physical Exam  Constitutional: She appears well-developed and well-nourished. No distress.  HENT:  Head: Normocephalic and atraumatic.  Eyes: Pupils are equal, round, and reactive to light.  Neck: Neck supple.  Musculoskeletal:  Examination of her right arm shows good shoulder range of motion. Elbow range of motion also is good. She has limited wrist range of motion with pain at the extremes tickly flexion and extension. She has a negative Tinel's at the elbow and there is no tenderness present. Semination of the wrist shows a well-healed surgical scar and some swelling mostly over the ulnar aspect. A markedly positive Tinel's of the ulnar nerve at the wrist. Tension of her ring and little fingers causes her to have pain at the wrist as well. She is intact to light touch throughout.  Skin: She is not diaphoretic.  Nursing note and vitals reviewed.   ED Course  Procedures (including critical care time)  Labs Review Labs Reviewed - No data to display  Imaging Review No results found.   Visual Acuity Review  Right Eye Distance:   Left Eye Distance:   Bilateral Distance:    Right Eye Near:   Left Eye Near:    Bilateral Near:      Right wrist splint applied   MDM   1. Ulnar nerve compression at multiple levels, right    New Prescriptions   NAPROXEN (NAPROSYN) 500 MG TABLET    Take 1 tablet (500 mg total) by mouth 2 (two) times daily with a meal.  Plan: 1. Diagnosis reviewed  with patient 2. rx as per orders; risks, benefits, potential side effects reviewed with patient 3. Recommend supportive treatment with wrist splint , elevation ,ice 4. F/u orthopedics at [facility], PA-C 11/23/14 1258

## 2014-11-25 ENCOUNTER — Telehealth: Payer: Self-pay

## 2014-11-25 NOTE — Telephone Encounter (Signed)
Received records request [employer], forwarded to [facility] for processing.

## 2014-12-27 ENCOUNTER — Other Ambulatory Visit: Payer: Self-pay | Admitting: Family Medicine

## 2014-12-27 NOTE — Telephone Encounter (Signed)
Routed to Dr. Shah for approval 

## 2015-01-03 ENCOUNTER — Ambulatory Visit: Payer: BLUE CROSS/BLUE SHIELD | Admitting: Cardiovascular Disease

## 2015-01-03 ENCOUNTER — Telehealth: Payer: Self-pay | Admitting: *Deleted

## 2015-01-03 NOTE — Telephone Encounter (Signed)
Dr Nanetta Batty office calling from [place] to know if Dr Fletcher Anon can call back  They would like to talk about some general information.  This is to help a claim on pt. Please call back.  The number provided is her Personal cell phone.

## 2015-01-03 NOTE — Telephone Encounter (Signed)
I don't know who Dr. Wynell Balloon is. We need a signed authorization from the patient to speak with a third party provider.

## 2015-01-04 NOTE — Telephone Encounter (Signed)
This encounter was created in error - please disregard.

## 2015-01-04 NOTE — Telephone Encounter (Signed)
Called the number back Dr Wynell Balloon did not have a clue about what I was talking about. She was not aware of anything on this patient. If this office calls back please let them know they need to go through medical records. Per Izora Gala

## 2015-01-05 ENCOUNTER — Telehealth: Payer: Self-pay | Admitting: *Deleted

## 2015-01-05 NOTE — Telephone Encounter (Signed)
error 

## 2015-01-24 ENCOUNTER — Ambulatory Visit (INDEPENDENT_AMBULATORY_CARE_PROVIDER_SITE_OTHER): Payer: BLUE CROSS/BLUE SHIELD | Admitting: Family Medicine

## 2015-01-24 ENCOUNTER — Encounter: Payer: Self-pay | Admitting: Family Medicine

## 2015-01-24 VITALS — BP 120/64 | HR 88 | Temp 97.9°F | Resp 16 | Ht 66.0 in | Wt 174.7 lb

## 2015-01-24 DIAGNOSIS — Z23 Encounter for immunization: Secondary | ICD-10-CM | POA: Diagnosis not present

## 2015-01-24 DIAGNOSIS — F329 Major depressive disorder, single episode, unspecified: Secondary | ICD-10-CM

## 2015-01-24 DIAGNOSIS — Z794 Long term (current) use of insulin: Secondary | ICD-10-CM

## 2015-01-24 DIAGNOSIS — N3000 Acute cystitis without hematuria: Secondary | ICD-10-CM | POA: Diagnosis not present

## 2015-01-24 DIAGNOSIS — F418 Other specified anxiety disorders: Secondary | ICD-10-CM | POA: Diagnosis not present

## 2015-01-24 DIAGNOSIS — F419 Anxiety disorder, unspecified: Secondary | ICD-10-CM

## 2015-01-24 DIAGNOSIS — F32A Depression, unspecified: Secondary | ICD-10-CM

## 2015-01-24 DIAGNOSIS — E119 Type 2 diabetes mellitus without complications: Secondary | ICD-10-CM | POA: Diagnosis not present

## 2015-01-24 DIAGNOSIS — N390 Urinary tract infection, site not specified: Secondary | ICD-10-CM | POA: Insufficient documentation

## 2015-01-24 LAB — POCT UA - MICROALBUMIN: Microalbumin Ur, POC: 100 mg/L

## 2015-01-24 LAB — POCT URINALYSIS DIPSTICK
Bilirubin, UA: NEGATIVE
GLUCOSE UA: NEGATIVE
Ketones, UA: NEGATIVE
NITRITE UA: NEGATIVE
PROTEIN UA: NEGATIVE
Spec Grav, UA: 1.01
UROBILINOGEN UA: 0.2
pH, UA: 5.5

## 2015-01-24 LAB — POCT GLYCOSYLATED HEMOGLOBIN (HGB A1C): HEMOGLOBIN A1C: 5.5

## 2015-01-24 MED ORDER — ALPRAZOLAM 1 MG PO TABS: 1.0000 mg | ORAL_TABLET | Freq: Two times a day (BID) | ORAL | Status: DC | PRN

## 2015-01-24 MED ORDER — ESCITALOPRAM OXALATE 20 MG PO TABS: 20.0000 mg | ORAL_TABLET | Freq: Every day | ORAL | Status: DC

## 2015-01-24 MED ORDER — NITROFURANTOIN MONOHYD MACRO 100 MG PO CAPS: 100.0000 mg | ORAL_CAPSULE | Freq: Two times a day (BID) | ORAL | Status: DC

## 2015-01-24 NOTE — Progress Notes (Signed)
Name: Tricia [name]   MRN: [id number]    DOB: January 26, 1961   Date:01/24/2015       Progress Note  Subjective  Chief Complaint  Chief Complaint  Patient presents with  . Medication Refill    follow-up 3 months  . Diabetes    Checks blood glucose 2 xday low-150, high-240  . COPD    stage 2 common sob, wheezing and cough  . Depression  . Migraine  . Pain  . Urinary Tract Infection    burning with urination    Diabetes She presents for her follow-up diabetic visit. She has type 2 diabetes mellitus. Her disease course has been stable. Hypoglycemia symptoms include nervousness/anxiousness. Associated symptoms include fatigue. Current diabetic treatment includes intensive insulin program and oral agent (monotherapy). Her weight is stable. She is following a generally healthy (Has been 'splurging' during the holidays) diet. Her breakfast blood glucose range is generally 140-180 mg/dl.  Depression        This is a chronic problem.The problem is unchanged.  Associated symptoms include fatigue, insomnia, decreased interest and sad.     The symptoms are aggravated by family issues.  Past treatments include SSRIs - Selective serotonin reuptake inhibitors.  Compliance with treatment is good. Urinary Tract Infection  This is a new problem. Episode onset: 2 weeks. Associated symptoms include hesitancy and urgency. Pertinent negatives include no chills, frequency or hematuria. She has tried nothing for the symptoms. There is no history of recurrent UTIs.    Past Medical History  Diagnosis Date  . Anxiety   . Depression   . Diabetes mellitus without complication ([place])   . Hypertension   . Hyperlipidemia   . Diastolic dysfunction   . Asthma   . Cholesteatoma of left ear     x2    Past Surgical History  Procedure Laterality Date  . Dilation and curettage of uterus    . Tubal ligation    . Carpal tunnel release    . External ear surgery      Family History  Problem Relation Age of  Onset  . Emphysema Mother   . Stroke Father   . Throat cancer Father     Social History   Social History  . Marital Status: Divorced    Spouse Name: N/A  . Number of Children: N/A  . Years of Education: N/A   Occupational History  . Not on file.   Social History Main Topics  . Smoking status: Current Every Day Smoker -- 0.50 packs/day for 38 years    Types: Cigarettes  . Smokeless tobacco: Not on file  . Alcohol Use: No  . Drug Use: No  . Sexual Activity: Not on file   Other Topics Concern  . Not on file   Social History Narrative     Current outpatient prescriptions:  .  albuterol (VENTOLIN HFA) 108 (90 BASE) MCG/ACT inhaler, Inhale into the lungs., Disp: , Rfl:  .  ALPRAZolam (XANAX) 1 MG tablet, Take 1 tablet (1 mg total) by mouth 2 (two) times daily as needed for anxiety., Disp: 60 tablet, Rfl: 2 .  baclofen (LIORESAL) 10 MG tablet, Take 10 mg by mouth 3 (three) times daily., Disp: , Rfl:  .  escitalopram (LEXAPRO) 20 MG tablet, TAKE 1 TABLET BY MOUTH ONCE DAILY., Disp: 30 tablet, Rfl: 2 .  furosemide (LASIX) 40 MG tablet, Take 40 mg by mouth daily., Disp: , Rfl:  .  gabapentin (NEURONTIN) 600 MG tablet, Take 1 tablet  by mouth 3 (three) times daily., Disp: , Rfl:  .  glucose blood test strip, , Disp: , Rfl:  .  Insulin Glargine (LANTUS SOLOSTAR) 100 UNIT/ML Solostar Pen, Inject 20 Units into the skin at bedtime and may repeat dose one time if needed. , Disp: , Rfl:  .  ipratropium-albuterol (DUONEB) 0.5-2.5 (3) MG/3ML SOLN, Inhale into the lungs., Disp: , Rfl:  .  metFORMIN (GLUCOPHAGE) 1000 MG tablet, Take 1,000 mg by mouth 2 (two) times daily with a meal. , Disp: , Rfl:  .  montelukast (SINGULAIR) 10 MG tablet, Take 10 mg by mouth at bedtime. , Disp: , Rfl:  .  naproxen (NAPROSYN) 500 MG tablet, Take 1 tablet (500 mg total) by mouth 2 (two) times daily with a meal., Disp: 60 tablet, Rfl: 0 .  Oxycodone HCl 20 MG TABS, Take 40 mg by mouth 2 (two) times daily. , Disp:  , Rfl:  .  predniSONE (DELTASONE) 5 MG tablet, Take 1 tablet by mouth daily., Disp: , Rfl: 1 .  promethazine (PHENERGAN) 25 MG tablet, Take 25 mg by mouth as needed. , Disp: , Rfl:  .  rizatriptan (MAXALT) 5 MG tablet, Take 5 mg by mouth as needed. , Disp: , Rfl:  .  tiotropium (SPIRIVA HANDIHALER) 18 MCG inhalation capsule, Place into inhaler and inhale., Disp: , Rfl:  .  TROKENDI XR 100 MG CP24, Take 1 tablet by mouth 3 (three) times daily., Disp: , Rfl: 5  Allergies  Allergen Reactions  . Augmentin [Amoxicillin-Pot Clavulanate]   . Penicillins     Review of Systems  Constitutional: Positive for fatigue. Negative for fever and chills.  Genitourinary: Positive for hesitancy and urgency. Negative for frequency and hematuria.  Psychiatric/Behavioral: Positive for depression. The patient is nervous/anxious and has insomnia.     Objective  Filed Vitals:   01/24/15 1158  BP: 120/64  Pulse: 88  Temp: 97.9 F (36.6 C)  TempSrc: Oral  Resp: 16  Height: 5\' 6"  (1.676 m)  Weight: 174 lb 11.2 oz (79.243 kg)  SpO2: 96%    Physical Exam  Constitutional: She is oriented to person, place, and time and well-developed, well-nourished, and in no distress.  Cardiovascular: Normal rate, regular rhythm and normal heart sounds.   Pulmonary/Chest: Effort normal and breath sounds normal. She has no wheezes.  Abdominal: Soft. Bowel sounds are normal. There is tenderness (tenderness in suprapubic area).  Neurological: She is alert and oriented to person, place, and time.  Psychiatric: Mood, memory, affect and judgment normal.  Nursing note and vitals reviewed.     Assessment & Plan  1. Needs flu shot  - Flu Vaccine QUAD 36+ mos PF IM (Fluarix & Fluzone Quad PF)  2. Diabetes mellitus type 2, insulin dependent (HCC)  - POCT HgB A1C - POCT UA - Microalbumin  3. Anxiety and depression  - escitalopram (LEXAPRO) 20 MG tablet; Take 1 tablet (20 mg total) by mouth daily.  Dispense: 90  tablet; Refill: 0 - ALPRAZolam (XANAX) 1 MG tablet; Take 1 tablet (1 mg total) by mouth 2 (two) times daily as needed for anxiety.  Dispense: 60 tablet; Refill: 2  4. Acute cystitis without hematuria  - POCT urinalysis dipstick - nitrofurantoin, macrocrystal-monohydrate, (MACROBID) 100 MG capsule; Take 1 capsule (100 mg total) by mouth 2 (two) times daily.  Dispense: 14 capsule; Refill: 0 - Urine Culture - Urinalysis, Routine w reflex microscopic     Aeriana Speece Asad A. [facility] 01/24/2015 12:20 PM

## 2015-01-25 LAB — URINE CULTURE: Organism ID, Bacteria: NO GROWTH

## 2015-01-25 LAB — URINALYSIS, ROUTINE W REFLEX MICROSCOPIC
BILIRUBIN UA: NEGATIVE
GLUCOSE, UA: NEGATIVE
Ketones, UA: NEGATIVE
Nitrite, UA: NEGATIVE
PH UA: 6.5 (ref 5.0–7.5)
SPEC GRAV UA: 1.015 (ref 1.005–1.030)
Urobilinogen, Ur: 1 mg/dL (ref 0.2–1.0)

## 2015-01-25 LAB — MICROSCOPIC EXAMINATION
CASTS: NONE SEEN /LPF
Epithelial Cells (non renal): >10 /hpf — AB (ref 0–10)
RBC, UA: >30 /hpf — AB (ref 0–?)
WBC, UA: >30 /hpf — AB (ref 0–?)

## 2015-01-28 ENCOUNTER — Telehealth: Payer: Self-pay | Admitting: Family Medicine

## 2015-01-28 MED ORDER — METFORMIN HCL 1000 MG PO TABS: 1000.0000 mg | ORAL_TABLET | Freq: Two times a day (BID) | ORAL | Status: DC

## 2015-01-28 NOTE — Telephone Encounter (Signed)
Metformin 1000 mg has been refilled and sent to [facility]

## 2015-01-28 NOTE — Telephone Encounter (Signed)
Requesting refill on Metformin 1000 mg. Please send to [facility]. There are no refills on the bottle

## 2015-01-31 ENCOUNTER — Encounter: Payer: Self-pay | Admitting: Cardiovascular Disease

## 2015-01-31 ENCOUNTER — Ambulatory Visit (INDEPENDENT_AMBULATORY_CARE_PROVIDER_SITE_OTHER): Payer: BLUE CROSS/BLUE SHIELD | Admitting: Cardiovascular Disease

## 2015-01-31 VITALS — BP 132/62 | HR 62 | Ht 69.0 in | Wt 174.8 lb

## 2015-01-31 DIAGNOSIS — I5032 Chronic diastolic (congestive) heart failure: Secondary | ICD-10-CM

## 2015-01-31 DIAGNOSIS — Z720 Tobacco use: Secondary | ICD-10-CM

## 2015-01-31 DIAGNOSIS — F17200 Nicotine dependence, unspecified, uncomplicated: Secondary | ICD-10-CM

## 2015-01-31 DIAGNOSIS — E785 Hyperlipidemia, unspecified: Secondary | ICD-10-CM

## 2015-01-31 DIAGNOSIS — I10 Essential (primary) hypertension: Secondary | ICD-10-CM | POA: Diagnosis not present

## 2015-01-31 NOTE — Patient Instructions (Signed)
Medication Instructions: No changes.   Labwork: None.   Procedures/Testing: None.   Follow-Up: 6 months with Dr. Fletcher Anon.   Any Additional Special Instructions Will Be Listed Below (If Applicable).

## 2015-01-31 NOTE — Assessment & Plan Note (Signed)
Blood pressure is reasonably controlled on current medications. 

## 2015-01-31 NOTE — Assessment & Plan Note (Signed)
The patient has what seems to be mild degree of diastolic heart failure with mild pulmonary hypertension. She is using furosemide as needed at the present time. Nuclear stress test was normal. I discussed with her the importance of controlling her risk factors. Echocardiogram showed mild to moderate mitral regurgitation but I don't hear any cardiac murmurs today. This can be followed clinically.

## 2015-01-31 NOTE — Assessment & Plan Note (Signed)
I discussed with her the importance of smoking cessation especially in the setting of multiple risk factors for coronary artery disease.

## 2015-01-31 NOTE — Assessment & Plan Note (Signed)
I recommend repeating her lipid profile in the near future. Given that she is diabetic, we should consider treatment with a statin if LDL is above 100.

## 2015-01-31 NOTE — Progress Notes (Signed)
Primary care physician: Dr. Lorin Glass  HPI  This is a 54 year old female who was is here today for a follow-up visit regarding chronic diastolic heart failure. She has chronic medical conditions that include type 2 diabetes, hypertension, tobacco use and COPD. She also reports chronic tremors and bad balance which is being evaluated by neurology. She was seen recently for worsening leg edema that responded to furosemide. Currently she takes furosemide only as needed and an average of once to twice a week. During her initial evaluation in August Hx labs which showed normal BNP, TSH and basic metabolic profile. Echocardiogram was done in August which showed normal LV systolic function, mild to moderate mitral regurgitation, mildly dilated left atrium and mild pulmonary hypertension with an estimated systolic pulmonary pressure 41 mmHg. Nuclear stress test was done for exertional dyspnea which showed no evidence of ischemia. ABI was normal. Overall, she is stable from a cardiac standpoint with rare episodes of left-sided chest aching which is not exertional. leg edema improved.   Allergies  Allergen Reactions  . Augmentin [Amoxicillin-Pot Clavulanate]   . Penicillins      Current Outpatient Prescriptions on File Prior to Visit  Medication Sig Dispense Refill  . albuterol (VENTOLIN HFA) 108 (90 BASE) MCG/ACT inhaler Inhale into the lungs.    . ALPRAZolam (XANAX) 1 MG tablet Take 1 tablet (1 mg total) by mouth 2 (two) times daily as needed for anxiety. 60 tablet 2  . baclofen (LIORESAL) 10 MG tablet Take 10 mg by mouth 3 (three) times daily.    Marland Kitchen escitalopram (LEXAPRO) 20 MG tablet Take 1 tablet (20 mg total) by mouth daily. 90 tablet 0  . furosemide (LASIX) 40 MG tablet Take 40 mg by mouth daily.    Marland Kitchen gabapentin (NEURONTIN) 600 MG tablet Take 1 tablet by mouth 3 (three) times daily.    Marland Kitchen glucose blood test strip     . Insulin Glargine (LANTUS SOLOSTAR) 100 UNIT/ML Solostar Pen Inject 20  Units into the skin at bedtime and may repeat dose one time if needed.     Marland Kitchen ipratropium-albuterol (DUONEB) 0.5-2.5 (3) MG/3ML SOLN Inhale into the lungs.    . metFORMIN (GLUCOPHAGE) 1000 MG tablet Take 1 tablet (1,000 mg total) by mouth 2 (two) times daily with a meal. 60 tablet 2  . montelukast (SINGULAIR) 10 MG tablet Take 10 mg by mouth at bedtime.     . naproxen (NAPROSYN) 500 MG tablet Take 1 tablet (500 mg total) by mouth 2 (two) times daily with a meal. 60 tablet 0  . nitrofurantoin, macrocrystal-monohydrate, (MACROBID) 100 MG capsule Take 1 capsule (100 mg total) by mouth 2 (two) times daily. 14 capsule 0  . Oxycodone HCl 20 MG TABS Take 40 mg by mouth 2 (two) times daily.     . predniSONE (DELTASONE) 5 MG tablet Take 1 tablet by mouth daily.  1  . promethazine (PHENERGAN) 25 MG tablet Take 25 mg by mouth as needed.     . rizatriptan (MAXALT) 5 MG tablet Take 5 mg by mouth as needed.     . tiotropium (SPIRIVA HANDIHALER) 18 MCG inhalation capsule Place into inhaler and inhale.    Marland Kitchen TROKENDI XR 100 MG CP24 Take 1 tablet by mouth 3 (three) times daily.  5   No current facility-administered medications on file prior to visit.     Past Medical History  Diagnosis Date  . Anxiety   . Depression   . Diabetes mellitus without complication ([place])   .  Hypertension   . Hyperlipidemia   . Diastolic dysfunction   . Asthma   . Cholesteatoma of left ear     x2     Past Surgical History  Procedure Laterality Date  . Dilation and curettage of uterus    . Tubal ligation    . Carpal tunnel release    . External ear surgery       Family History  Problem Relation Age of Onset  . Emphysema Mother   . Stroke Father   . Throat cancer Father      Social History   Social History  . Marital Status: Divorced    Spouse Name: N/A  . Number of Children: N/A  . Years of Education: N/A   Occupational History  . Not on file.   Social History Main Topics  . Smoking status: Current  Every Day Smoker -- 0.50 packs/day for 38 years    Types: Cigarettes  . Smokeless tobacco: Not on file  . Alcohol Use: No  . Drug Use: No  . Sexual Activity: Not on file   Other Topics Concern  . Not on file   Social History Narrative     ROS A 10 point review of system was performed. It is negative other than that mentioned in the history of present illness.   PHYSICAL EXAM   BP 132/62 mmHg  Pulse 62  Ht 5\' 9"  (1.753 m)  Wt 174 lb 12 oz (79.266 kg)  BMI 25.79 kg/m2 Constitutional: She is oriented to person, place, and time. She appears well-developed and well-nourished. No distress.  HENT: No nasal discharge.  Head: Normocephalic and atraumatic.  Eyes: Pupils are equal and round. No discharge.  Neck: Normal range of motion. Neck supple. No JVD present. No thyromegaly present.  Cardiovascular: Normal rate, regular rhythm, normal heart sounds. Exam reveals no gallop and no friction rub. No murmur heard.  Pulmonary/Chest: Effort normal and breath sounds normal. No stridor. No respiratory distress. She has no wheezes. She has no rales. She exhibits no tenderness.  Abdominal: Soft. Bowel sounds are normal. She exhibits no distension. There is no tenderness. There is no rebound and no guarding.  Musculoskeletal: Normal range of motion. She exhibits trace edema and no tenderness.  Neurological: She is alert and oriented to person, place, and time. Coordination normal.  Skin: Skin is warm and dry. Chronic stasis dermatitis. She is not diaphoretic. No erythema. No pallor.  Psychiatric: She has a normal mood and affect. Her behavior is normal. Judgment and thought content normal.        ASSESSMENT AND PLAN

## 2015-02-09 ENCOUNTER — Other Ambulatory Visit: Payer: Self-pay | Admitting: Family Medicine

## 2015-02-10 ENCOUNTER — Telehealth: Payer: Self-pay

## 2015-02-10 NOTE — Telephone Encounter (Signed)
Received records request [employer] , forwarded to [facility] for processing.

## 2015-03-01 ENCOUNTER — Telehealth: Payer: Self-pay

## 2015-03-01 NOTE — Telephone Encounter (Signed)
Received records request Tricia [name], forwarded to [facility] for processing.

## 2015-04-01 ENCOUNTER — Other Ambulatory Visit: Payer: Self-pay | Admitting: Family Medicine

## 2015-04-25 ENCOUNTER — Ambulatory Visit: Payer: BLUE CROSS/BLUE SHIELD | Admitting: Family Medicine

## 2015-04-28 ENCOUNTER — Other Ambulatory Visit: Payer: Self-pay | Admitting: Family Medicine

## 2015-05-02 ENCOUNTER — Ambulatory Visit: Payer: BLUE CROSS/BLUE SHIELD | Admitting: Family Medicine

## 2015-05-02 ENCOUNTER — Telehealth: Payer: Self-pay | Admitting: Family Medicine

## 2015-05-02 NOTE — Telephone Encounter (Signed)
Patient had appointment for today and arrived 10 minutes late, however she did reschedule for this Thursday. Patient did pick up her prescription for Xanex but was wondering why there are no refills on the prescription, stated that Dr Manuella Ghazi always give her refills. She is requesting a return call to discuss this.

## 2015-05-03 NOTE — Telephone Encounter (Signed)
Called and spoke with patient and she will be in on Thursday 05/05/2015

## 2015-05-05 ENCOUNTER — Ambulatory Visit (INDEPENDENT_AMBULATORY_CARE_PROVIDER_SITE_OTHER): Payer: Medicare Other | Admitting: Family Medicine

## 2015-05-05 ENCOUNTER — Encounter: Payer: Self-pay | Admitting: Family Medicine

## 2015-05-05 VITALS — BP 136/60 | HR 84 | Temp 98.0°F | Resp 17 | Ht 69.0 in | Wt 178.0 lb

## 2015-05-05 DIAGNOSIS — E119 Type 2 diabetes mellitus without complications: Secondary | ICD-10-CM | POA: Diagnosis not present

## 2015-05-05 DIAGNOSIS — Z794 Long term (current) use of insulin: Secondary | ICD-10-CM

## 2015-05-05 DIAGNOSIS — F419 Anxiety disorder, unspecified: Secondary | ICD-10-CM

## 2015-05-05 DIAGNOSIS — F418 Other specified anxiety disorders: Secondary | ICD-10-CM | POA: Diagnosis not present

## 2015-05-05 DIAGNOSIS — E785 Hyperlipidemia, unspecified: Secondary | ICD-10-CM

## 2015-05-05 DIAGNOSIS — F32A Depression, unspecified: Secondary | ICD-10-CM

## 2015-05-05 DIAGNOSIS — F329 Major depressive disorder, single episode, unspecified: Secondary | ICD-10-CM

## 2015-05-05 LAB — GLUCOSE, POCT (MANUAL RESULT ENTRY): POC GLUCOSE: 90 mg/dL (ref 70–99)

## 2015-05-05 LAB — POCT GLYCOSYLATED HEMOGLOBIN (HGB A1C): HEMOGLOBIN A1C: 6.1

## 2015-05-05 MED ORDER — ALPRAZOLAM 1 MG PO TABS: 1.0000 mg | ORAL_TABLET | Freq: Two times a day (BID) | ORAL | Status: DC | PRN

## 2015-05-05 NOTE — Progress Notes (Signed)
Name: Tricia [name]   MRN: [id number]    DOB: [birth year]   Date:05/05/2015       Progress Note Subjective  Chief Complaint  Chief Complaint  Patient presents with  . Follow-up    3 mo  . Diabetes  . Hyperlipidemia    Diabetes She presents for her follow-up diabetic visit. She has type 2 diabetes mellitus. Her disease course has been stable. Hypoglycemia symptoms include nervousness/anxiousness. Pertinent negatives for diabetes include no chest pain, no polydipsia and no polyuria. Current diabetic treatment includes oral agent (monotherapy) and intensive insulin program. She is currently taking insulin at bedtime. Insulin injections are given by patient. She is following a diabetic diet. Frequency home blood tests: does not check her BG. An ACE inhibitor/angiotensin II receptor blocker is not being taken.  Hyperlipidemia This is a chronic problem. The problem is controlled. Pertinent negatives include no chest pain, myalgias or shortness of breath. She is currently on no antihyperlipidemic treatment.  Anxiety Presents for follow-up visit. The problem has been unchanged. Symptoms include excessive worry and nervous/anxious behavior. Patient reports no chest pain, panic or shortness of breath. The severity of symptoms is moderate.   Past treatments include benzodiazephines. The treatment provided moderate relief. Compliance with prior treatments has been good.    Past Medical History  Diagnosis Date  . Anxiety   . Depression   . Diabetes mellitus without complication ([place])   . Hypertension   . Hyperlipidemia   . Diastolic dysfunction   . Asthma   . Cholesteatoma of left ear     x2    Past Surgical History  Procedure Laterality Date  . Dilation and curettage of uterus    . Tubal ligation    . Carpal tunnel release    . External ear surgery      Family History  Problem Relation Age of Onset  . Emphysema Mother   . Stroke Father   . Throat cancer Father     Social  History   Social History  . Marital Status: Divorced    Spouse Name: N/A  . Number of Children: N/A  . Years of Education: N/A   Occupational History  . Not on file.   Social History Main Topics  . Smoking status: Current Every Day Smoker -- 0.50 packs/day for 38 years    Types: Cigarettes  . Smokeless tobacco: Not on file  . Alcohol Use: No  . Drug Use: No  . Sexual Activity: Not on file   Other Topics Concern  . Not on file   Social History Narrative     Current outpatient prescriptions:  .  albuterol (VENTOLIN HFA) 108 (90 BASE) MCG/ACT inhaler, Inhale into the lungs., Disp: , Rfl:  .  ALPRAZolam (XANAX) 1 MG tablet, TAKE 1 TABLET BY MOUTH TWICE DAILY AS NEEDED FOR ANXIETY., Disp: 60 tablet, Rfl: 0 .  baclofen (LIORESAL) 10 MG tablet, Take 10 mg by mouth 3 (three) times daily., Disp: , Rfl:  .  escitalopram (LEXAPRO) 20 MG tablet, TAKE 1 TABLET BY MOUTH ONCE DAILY., Disp: 90 tablet, Rfl: 0 .  furosemide (LASIX) 40 MG tablet, Take 40 mg by mouth daily as needed. , Disp: , Rfl:  .  gabapentin (NEURONTIN) 600 MG tablet, Take 1 tablet by mouth 3 (three) times daily., Disp: , Rfl:  .  glucose blood test strip, , Disp: , Rfl:  .  Insulin Glargine (LANTUS SOLOSTAR) 100 UNIT/ML Solostar Pen, Inject 20 Units into the skin at  bedtime and may repeat dose one time if needed. , Disp: , Rfl:  .  ipratropium-albuterol (DUONEB) 0.5-2.5 (3) MG/3ML SOLN, Inhale into the lungs., Disp: , Rfl:  .  metFORMIN (GLUCOPHAGE) 1000 MG tablet, Take 1 tablet (1,000 mg total) by mouth 2 (two) times daily with a meal., Disp: 60 tablet, Rfl: 2 .  montelukast (SINGULAIR) 10 MG tablet, Take 10 mg by mouth at bedtime. , Disp: , Rfl:  .  naproxen (NAPROSYN) 500 MG tablet, Take 1 tablet (500 mg total) by mouth 2 (two) times daily with a meal., Disp: 60 tablet, Rfl: 0 .  nitrofurantoin, macrocrystal-monohydrate, (MACROBID) 100 MG capsule, Take 1 capsule (100 mg total) by mouth 2 (two) times daily., Disp: 14  capsule, Rfl: 0 .  Oxycodone HCl 20 MG TABS, Take 40 mg by mouth 2 (two) times daily. , Disp: , Rfl:  .  predniSONE (DELTASONE) 5 MG tablet, Take 1 tablet by mouth daily., Disp: , Rfl: 1 .  promethazine (PHENERGAN) 25 MG tablet, Take 25 mg by mouth as needed. , Disp: , Rfl:  .  rizatriptan (MAXALT) 5 MG tablet, Take 5 mg by mouth as needed. , Disp: , Rfl:  .  theophylline (UNIPHYL) 400 MG 24 hr tablet, , Disp: , Rfl: 10 .  tiotropium (SPIRIVA HANDIHALER) 18 MCG inhalation capsule, Place into inhaler and inhale., Disp: , Rfl:  .  TROKENDI XR 100 MG CP24, Take 1 tablet by mouth 3 (three) times daily., Disp: , Rfl: 5  Allergies  Allergen Reactions  . Augmentin [Amoxicillin-Pot Clavulanate]   . Penicillins      Review of Systems  Respiratory: Negative for shortness of breath.   Cardiovascular: Negative for chest pain.  Musculoskeletal: Negative for myalgias.  Endo/Heme/Allergies: Negative for polydipsia.  Psychiatric/Behavioral: The patient is nervous/anxious.     Objective  Filed Vitals:   05/05/15 1518  BP: 136/60  Pulse: 84  Temp: 98 F (36.7 C)  TempSrc: Oral  Resp: 17  Height: 5\' 9"  (1.753 m)  Weight: 178 lb (80.74 kg)  SpO2: 98%    Physical Exam  Constitutional: She is oriented to person, place, and time and well-developed, well-nourished, and in no distress.  HENT:  Head: Normocephalic and atraumatic.  Cardiovascular: Normal rate and regular rhythm.   Pulmonary/Chest: Effort normal and breath sounds normal.  Abdominal: Soft. Bowel sounds are normal.  Neurological: She is alert and oriented to person, place, and time.  Psychiatric: Mood, memory, affect and judgment normal.  Nursing note and vitals reviewed.      Assessment & Plan  1. Diabetes mellitus type 2, insulin dependent (HCC) A1c is expired and 1%, well-controlled diabetes. Obtain urine microalbumin/creatinine ratio for evaluation of proteinuria. Continue on insulin and metformin. - Urine  Microalbumin w/creat. ratio - POCT HgB A1C - POCT Glucose (CBG)  2. Dyslipidemia Obtain lipid panel and consider starting on statin therapy. - Lipid Profile - Comprehensive Metabolic Panel (CMET)  3. Anxiety and depression Stable, worse on some days mainly due to family and social stressors. Refill for alprazolam provided. - ALPRAZolam (XANAX) 1 MG tablet; Take 1 tablet (1 mg total) by mouth 2 (two) times daily as needed for anxiety.  Dispense: 60 tablet; Refill: 0   Davone Shinault [facility] 05/05/2015 3:28 PM

## 2015-05-31 ENCOUNTER — Other Ambulatory Visit: Payer: Self-pay | Admitting: Family Medicine

## 2015-06-01 DIAGNOSIS — G894 Chronic pain syndrome: Secondary | ICD-10-CM | POA: Diagnosis not present

## 2015-06-01 DIAGNOSIS — M5430 Sciatica, unspecified side: Secondary | ICD-10-CM | POA: Diagnosis not present

## 2015-06-01 DIAGNOSIS — M545 Low back pain: Secondary | ICD-10-CM | POA: Diagnosis not present

## 2015-06-01 DIAGNOSIS — Z79891 Long term (current) use of opiate analgesic: Secondary | ICD-10-CM | POA: Diagnosis not present

## 2015-06-15 ENCOUNTER — Other Ambulatory Visit: Payer: Self-pay | Admitting: Family Medicine

## 2015-06-28 ENCOUNTER — Other Ambulatory Visit: Payer: Self-pay | Admitting: Family Medicine

## 2015-07-04 ENCOUNTER — Telehealth: Payer: Self-pay | Admitting: Family Medicine

## 2015-07-04 DIAGNOSIS — F419 Anxiety disorder, unspecified: Principal | ICD-10-CM

## 2015-07-04 DIAGNOSIS — F329 Major depressive disorder, single episode, unspecified: Secondary | ICD-10-CM

## 2015-07-04 DIAGNOSIS — F32A Depression, unspecified: Secondary | ICD-10-CM

## 2015-07-04 MED ORDER — ALPRAZOLAM 1 MG PO TABS: 1.0000 mg | ORAL_TABLET | Freq: Two times a day (BID) | ORAL | Status: DC | PRN

## 2015-07-04 NOTE — Telephone Encounter (Signed)
Prescription for alprazolam is ready for pickup 

## 2015-07-04 NOTE — Telephone Encounter (Signed)
Pt needs refill on her Alprazolam. Pt has an appt scheduled.

## 2015-07-04 NOTE — Telephone Encounter (Signed)
Pt informed refill is ready for pickup

## 2015-07-06 DIAGNOSIS — Z794 Long term (current) use of insulin: Secondary | ICD-10-CM | POA: Diagnosis not present

## 2015-07-06 DIAGNOSIS — E785 Hyperlipidemia, unspecified: Secondary | ICD-10-CM | POA: Diagnosis not present

## 2015-07-06 DIAGNOSIS — E119 Type 2 diabetes mellitus without complications: Secondary | ICD-10-CM | POA: Diagnosis not present

## 2015-07-07 LAB — LIPID PANEL
Chol/HDL Ratio: 5.3 ratio units — ABNORMAL HIGH (ref 0.0–4.4)
Cholesterol, Total: 163 mg/dL (ref 100–199)
HDL: 31 mg/dL — AB (ref >39)
LDL Calculated: 107 mg/dL — ABNORMAL HIGH (ref 0–99)
TRIGLYCERIDES: 123 mg/dL (ref 0–149)
VLDL Cholesterol Cal: 25 mg/dL (ref 5–40)

## 2015-07-07 LAB — COMPREHENSIVE METABOLIC PANEL
A/G RATIO: 1.6 (ref 1.2–2.2)
ALBUMIN: 4.2 g/dL (ref 3.5–5.5)
ALT: 12 IU/L (ref 0–32)
AST: 15 IU/L (ref 0–40)
Alkaline Phosphatase: 84 IU/L (ref 39–117)
BILIRUBIN TOTAL: 0.3 mg/dL (ref 0.0–1.2)
BUN / CREAT RATIO: 9 (ref 9–23)
BUN: 9 mg/dL (ref 6–24)
CHLORIDE: 107 mmol/L — AB (ref 96–106)
CO2: 21 mmol/L (ref 18–29)
Calcium: 9.2 mg/dL (ref 8.7–10.2)
Creatinine, Ser: 0.96 mg/dL (ref 0.57–1.00)
GFR calc non Af Amer: 67 mL/min/{1.73_m2} (ref >59)
GFR, EST AFRICAN AMERICAN: 78 mL/min/{1.73_m2} (ref >59)
Globulin, Total: 2.7 g/dL (ref 1.5–4.5)
Glucose: 110 mg/dL — ABNORMAL HIGH (ref 65–99)
Potassium: 4.4 mmol/L (ref 3.5–5.2)
Sodium: 146 mmol/L — ABNORMAL HIGH (ref 134–144)
TOTAL PROTEIN: 6.9 g/dL (ref 6.0–8.5)

## 2015-07-07 LAB — MICROALBUMIN / CREATININE URINE RATIO
Creatinine, Urine: 49.3 mg/dL
MICROALB/CREAT RATIO: 18.5 mg/g{creat} (ref 0.0–30.0)
MICROALBUM., U, RANDOM: 9.1 ug/mL

## 2015-08-01 ENCOUNTER — Ambulatory Visit: Payer: Medicare Other | Admitting: Family Medicine

## 2015-08-04 ENCOUNTER — Ambulatory Visit: Payer: Medicare Other | Admitting: Family Medicine

## 2015-10-11 ENCOUNTER — Other Ambulatory Visit: Payer: Self-pay | Admitting: Family Medicine

## 2015-10-31 DIAGNOSIS — Z720 Tobacco use: Secondary | ICD-10-CM | POA: Diagnosis not present

## 2015-10-31 DIAGNOSIS — M5430 Sciatica, unspecified side: Secondary | ICD-10-CM | POA: Diagnosis not present

## 2015-10-31 DIAGNOSIS — M545 Low back pain: Secondary | ICD-10-CM | POA: Diagnosis not present

## 2015-10-31 DIAGNOSIS — Z716 Tobacco abuse counseling: Secondary | ICD-10-CM | POA: Diagnosis not present

## 2015-10-31 DIAGNOSIS — G894 Chronic pain syndrome: Secondary | ICD-10-CM | POA: Diagnosis not present

## 2015-10-31 DIAGNOSIS — F17210 Nicotine dependence, cigarettes, uncomplicated: Secondary | ICD-10-CM | POA: Diagnosis not present

## 2015-10-31 DIAGNOSIS — Z79891 Long term (current) use of opiate analgesic: Secondary | ICD-10-CM | POA: Diagnosis not present

## 2015-11-17 ENCOUNTER — Telehealth: Payer: Self-pay | Admitting: Cardiovascular Disease

## 2015-11-17 NOTE — Telephone Encounter (Signed)
3 attempts to schedule fu from recall list.   deleteting recall.

## 2016-03-21 ENCOUNTER — Other Ambulatory Visit: Payer: Self-pay | Admitting: Family Medicine

## 2016-04-18 ENCOUNTER — Other Ambulatory Visit: Payer: Self-pay | Admitting: Family Medicine

## 2016-04-20 ENCOUNTER — Ambulatory Visit: Payer: BLUE CROSS/BLUE SHIELD | Admitting: Cardiovascular Disease

## 2016-04-20 ENCOUNTER — Encounter: Payer: Self-pay | Admitting: *Deleted

## 2016-05-11 ENCOUNTER — Other Ambulatory Visit: Payer: Self-pay | Admitting: Family Medicine

## 2016-06-18 ENCOUNTER — Other Ambulatory Visit: Payer: Self-pay | Admitting: Family Medicine

## 2016-06-19 ENCOUNTER — Encounter: Payer: Self-pay | Admitting: Family Medicine

## 2016-06-19 ENCOUNTER — Ambulatory Visit (INDEPENDENT_AMBULATORY_CARE_PROVIDER_SITE_OTHER): Payer: Medicare PPO | Admitting: Family Medicine

## 2016-06-19 VITALS — BP 124/82 | HR 97 | Temp 98.4°F | Resp 14 | Ht 69.0 in | Wt 193.0 lb

## 2016-06-19 DIAGNOSIS — R110 Nausea: Secondary | ICD-10-CM | POA: Diagnosis not present

## 2016-06-19 DIAGNOSIS — F419 Anxiety disorder, unspecified: Secondary | ICD-10-CM | POA: Diagnosis not present

## 2016-06-19 DIAGNOSIS — F3342 Major depressive disorder, recurrent, in full remission: Secondary | ICD-10-CM

## 2016-06-19 MED ORDER — PROMETHAZINE HCL 25 MG PO TABS: 25.0000 mg | ORAL_TABLET | Freq: Three times a day (TID) | ORAL | 3 refills | Status: DC | PRN

## 2016-06-19 MED ORDER — ESCITALOPRAM OXALATE 20 MG PO TABS: 20.0000 mg | ORAL_TABLET | Freq: Every day | ORAL | 0 refills | Status: DC
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
## 2016-07-30 ENCOUNTER — Ambulatory Visit: Payer: Medicare PPO

## 2016-08-01 ENCOUNTER — Other Ambulatory Visit: Payer: Self-pay | Admitting: Family Medicine

## 2016-08-01 DIAGNOSIS — F3342 Major depressive disorder, recurrent, in full remission: Secondary | ICD-10-CM

## 2016-09-13 ENCOUNTER — Other Ambulatory Visit: Payer: Self-pay | Admitting: Family Medicine

## 2016-09-13 DIAGNOSIS — F3342 Major depressive disorder, recurrent, in full remission: Secondary | ICD-10-CM

## 2016-09-16 ENCOUNTER — Other Ambulatory Visit: Payer: Self-pay | Admitting: Family Medicine

## 2016-09-16 DIAGNOSIS — F3342 Major depressive disorder, recurrent, in full remission: Secondary | ICD-10-CM

## 2016-09-18 ENCOUNTER — Ambulatory Visit (INDEPENDENT_AMBULATORY_CARE_PROVIDER_SITE_OTHER): Payer: Medicare PPO | Admitting: Family Medicine

## 2016-09-18 ENCOUNTER — Encounter: Payer: Self-pay | Admitting: Family Medicine

## 2016-09-18 VITALS — BP 112/78 | HR 95 | Temp 97.9°F | Resp 16 | Ht 69.0 in | Wt 196.3 lb

## 2016-09-18 DIAGNOSIS — F3342 Major depressive disorder, recurrent, in full remission: Secondary | ICD-10-CM

## 2016-09-18 DIAGNOSIS — H9212 Otorrhea, left ear: Secondary | ICD-10-CM | POA: Diagnosis not present

## 2016-09-18 DIAGNOSIS — E1142 Type 2 diabetes mellitus with diabetic polyneuropathy: Secondary | ICD-10-CM

## 2016-09-18 LAB — GLUCOSE, POCT (MANUAL RESULT ENTRY): POC Glucose: 100 mg/dl — AB (ref 70–99)

## 2016-09-18 MED ORDER — ESCITALOPRAM OXALATE 20 MG PO TABS: 20.0000 mg | ORAL_TABLET | Freq: Every day | ORAL | 0 refills | Status: DC

## 2016-09-18 MED ORDER — CIPROFLOXACIN-DEXAMETHASONE 0.3-0.1 % OT SUSP: 4.0000 [drp] | Freq: Two times a day (BID) | OTIC | 0 refills | Status: AC

## 2016-09-18 NOTE — Progress Notes (Signed)
Name: Tricia [name]   MRN: [id number]    DOB: Mar 11, 1960   Date:09/18/2016       Progress Note  Subjective  Chief Complaint  Chief Complaint  Patient presents with  . Medication Refill    Diabetes  She presents for her follow-up diabetic visit. She has type 2 diabetes mellitus. Her disease course has been stable. Hypoglycemia symptoms include dizziness. Pertinent negatives for hypoglycemia include no headaches. Associated symptoms include foot paresthesias. Pertinent negatives for diabetes include no chest pain, no fatigue, no polydipsia and no polyuria. Current diabetic treatment includes oral agent (monotherapy) and intensive insulin program (has not taken Lantus in several months). Her weight is stable. She is following a diabetic and generally healthy diet.  Depression         This is a recurrent problem.  The onset quality is gradual. The problem is unchanged.  Associated symptoms include no fatigue, no hopelessness, no decreased interest, no body aches, no headaches and not sad.  Past treatments include SSRIs - Selective serotonin reuptake inhibitors. Dizziness  This is a recurrent problem. The current episode started 1 to 4 weeks ago (3 weeks ago). The problem occurs intermittently. Associated symptoms include chills, nausea, vertigo and vomiting. Pertinent negatives include no chest pain, congestion, coughing, fatigue, fever, headaches, sore throat or swollen glands. She has tried nothing for the symptoms. The treatment provided no relief.     Past Medical History:  Diagnosis Date  . Anxiety   . Asthma   . Cholesteatoma of left ear    x2  . Depression   . Diabetes mellitus without complication ([place])   . Diastolic dysfunction   . Hyperlipidemia   . Hypertension     Past Surgical History:  Procedure Laterality Date  . CARPAL TUNNEL RELEASE    . DILATION AND CURETTAGE OF UTERUS    . EXTERNAL EAR SURGERY    . TUBAL LIGATION      Family History  Problem Relation  Age of Onset  . Emphysema Mother   . Stroke Father   . Throat cancer Father     Social History   Social History  . Marital status: Divorced    Spouse name: N/A  . Number of children: N/A  . Years of education: N/A   Occupational History  . Not on file.   Social History Main Topics  . Smoking status: Current Every Day Smoker    Packs/day: 0.50    Years: 38.00    Types: Cigarettes  . Smokeless tobacco: Never Used  . Alcohol use No  . Drug use: No  . Sexual activity: Not on file   Other Topics Concern  . Not on file   Social History Narrative  . No narrative on file     Current Outpatient Prescriptions:  .  albuterol (VENTOLIN HFA) 108 (90 BASE) MCG/ACT inhaler, Inhale into the lungs., Disp: , Rfl:  .  ALPRAZolam (XANAX) 1 MG tablet, Take 1 tablet (1 mg total) by mouth 2 (two) times daily as needed for anxiety., Disp: 60 tablet, Rfl: 0 .  baclofen (LIORESAL) 10 MG tablet, Take 10 mg by mouth 3 (three) times daily., Disp: , Rfl:  .  escitalopram (LEXAPRO) 20 MG tablet, Take 1 tablet (20 mg total) by mouth daily., Disp: 90 tablet, Rfl: 0 .  furosemide (LASIX) 40 MG tablet, Take 40 mg by mouth daily as needed. , Disp: , Rfl:  .  gabapentin (NEURONTIN) 600 MG tablet, Take 1 tablet by mouth  3 (three) times daily., Disp: , Rfl:  .  glucose blood test strip, , Disp: , Rfl:  .  ipratropium-albuterol (DUONEB) 0.5-2.5 (3) MG/3ML SOLN, Inhale into the lungs., Disp: , Rfl:  .  LANTUS SOLOSTAR 100 UNIT/ML Solostar Pen, INJECT 20 UNITS SUBCUTANEOUSLY ONCE DAILY AT BEDTIME, Disp: 15 mL, Rfl: 1 .  metFORMIN (GLUCOPHAGE) 1000 MG tablet, Take 1 tablet (1,000 mg total) by mouth 2 (two) times daily with a meal., Disp: 60 tablet, Rfl: 2 .  montelukast (SINGULAIR) 10 MG tablet, Take 10 mg by mouth at bedtime. , Disp: , Rfl:  .  naproxen (NAPROSYN) 500 MG tablet, Take 1 tablet (500 mg total) by mouth 2 (two) times daily with a meal., Disp: 60 tablet, Rfl: 0 .  Oxycodone HCl 20 MG TABS, Take 40  mg by mouth 2 (two) times daily. , Disp: , Rfl:  .  predniSONE (DELTASONE) 5 MG tablet, Take 1 tablet by mouth daily., Disp: , Rfl: 1 .  promethazine (PHENERGAN) 25 MG tablet, Take 1 tablet (25 mg total) by mouth every 8 (eight) hours as needed., Disp: 90 tablet, Rfl: 3 .  rizatriptan (MAXALT) 5 MG tablet, Take 5 mg by mouth as needed. , Disp: , Rfl:  .  theophylline (UNIPHYL) 400 MG 24 hr tablet, , Disp: , Rfl: 10 .  tiotropium (SPIRIVA HANDIHALER) 18 MCG inhalation capsule, Place into inhaler and inhale., Disp: , Rfl:  .  TROKENDI XR 100 MG CP24, Take 1 tablet by mouth 3 (three) times daily., Disp: , Rfl: 5  Allergies  Allergen Reactions  . Augmentin [Amoxicillin-Pot Clavulanate]   . Penicillins      Review of Systems  Constitutional: Positive for chills. Negative for fatigue and fever.  HENT: Negative for congestion and sore throat.   Respiratory: Negative for cough.   Cardiovascular: Negative for chest pain.  Gastrointestinal: Positive for nausea and vomiting.  Neurological: Positive for dizziness and vertigo. Negative for headaches.  Endo/Heme/Allergies: Negative for polydipsia.  Psychiatric/Behavioral: Positive for depression.     Objective  Vitals:   09/18/16 1504  BP: 112/78  Pulse: 95  Resp: 16  Temp: 97.9 F (36.6 C)  TempSrc: Oral  SpO2: 98%  Weight: 196 lb 4.8 oz (89 kg)  Height: 5\' 9"  (1.753 m)    Physical Exam  Constitutional: She is oriented to person, place, and time and well-developed, well-nourished, and in no distress.  HENT:  Head: Normocephalic and atraumatic.  Right Ear: Tympanic membrane and ear canal normal. No drainage.  Left Ear: There is drainage and swelling. No tenderness.  Left ear canal with a small dried bleeding focus and greenish fluid collection on the superior aspect, TM normal in appearance.  Cardiovascular: Normal rate, regular rhythm and normal heart sounds.   No murmur heard. Pulmonary/Chest: Effort normal and breath sounds  normal. She has no wheezes.  Neurological: She is alert and oriented to person, place, and time.  Nursing note and vitals reviewed.     Assessment & Plan  1. Recurrent major depressive disorder, in full remission ([place]) Continue an SSRI, refills provided - escitalopram (LEXAPRO) 20 MG tablet; Take 1 tablet (20 mg total) by mouth daily.  Dispense: 90 tablet; Refill: 0  2. Type 2 diabetes mellitus with peripheral neuropathy (HCC) Now off Lantus, obtain A1c and adjust therapy as appropriate - HgB A1c  3. Drainage from left ear Suspect otitis externa, started on Ciprodex - ciprofloxacin-dexamethasone (CIPRODEX) OTIC suspension; Place 4 drops into the left ear 2 (two)  times daily.  Dispense: 7.5 mL; Refill: 0   Neilson Oehlert [facility] 09/18/2016 3:19 PM

## 2016-09-19 LAB — HEMOGLOBIN A1C
Hgb A1c MFr Bld: 6.4 % — ABNORMAL HIGH (ref <5.7)
MEAN PLASMA GLUCOSE: 137 mg/dL

## 2016-09-20 ENCOUNTER — Telehealth: Payer: Self-pay | Admitting: Family Medicine

## 2016-09-20 NOTE — Telephone Encounter (Signed)
I have not seen the information from the pharmacy, however for otitis externa (which is the suspected etiology behind drainage from the left ear), ofloxacin otic solution is another option. Please advise

## 2016-09-20 NOTE — Telephone Encounter (Signed)
Pt was prescribed ear drops and has found out that her insurance will not cover it. They would not tell her what the insurance would cover. Pt states that pharmacy has been trying to fax something over to dr Manuella Ghazi. She uses [facility]

## 2016-09-20 NOTE — Telephone Encounter (Signed)
Pt also checking status on her A1C results

## 2016-09-20 NOTE — Telephone Encounter (Signed)
Patient has been notified of lab results  

## 2016-09-21 DIAGNOSIS — M545 Low back pain: Secondary | ICD-10-CM | POA: Diagnosis not present

## 2016-09-21 DIAGNOSIS — G894 Chronic pain syndrome: Secondary | ICD-10-CM | POA: Diagnosis not present

## 2016-09-21 DIAGNOSIS — Z5181 Encounter for therapeutic drug level monitoring: Secondary | ICD-10-CM | POA: Diagnosis not present

## 2016-09-21 DIAGNOSIS — Z79891 Long term (current) use of opiate analgesic: Secondary | ICD-10-CM | POA: Diagnosis not present

## 2016-09-21 DIAGNOSIS — M5430 Sciatica, unspecified side: Secondary | ICD-10-CM | POA: Diagnosis not present

## 2016-10-11 ENCOUNTER — Telehealth: Payer: Self-pay | Admitting: Family Medicine

## 2016-10-11 ENCOUNTER — Other Ambulatory Visit: Payer: Self-pay

## 2016-10-11 DIAGNOSIS — H9212 Otorrhea, left ear: Secondary | ICD-10-CM

## 2016-10-11 MED ORDER — GLUCOSE BLOOD VI STRP: ORAL_STRIP | 2 refills | Status: DC

## 2016-10-11 MED ORDER — METFORMIN HCL 1000 MG PO TABS: 1000.0000 mg | ORAL_TABLET | Freq: Two times a day (BID) | ORAL | 2 refills | Status: DC

## 2016-10-11 NOTE — Telephone Encounter (Signed)
Pt would like a call back about her medications. Please call her back.

## 2016-10-11 NOTE — Telephone Encounter (Signed)
Lancets, accu chek and alcohol wipes.  Also has not heard anything about her ear drops?  Please advise  She needs this sent to new mail order pharmacy

## 2016-10-11 NOTE — Telephone Encounter (Signed)
Ear drops were prescribed at the time of patient's office visit. Please confirm.

## 2016-10-12 DIAGNOSIS — H9212 Otorrhea, left ear: Secondary | ICD-10-CM | POA: Insufficient documentation

## 2016-10-12 MED ORDER — NEOMYCIN-POLYMYXIN-HC 3.5-10000-1 OT SOLN: 4.0000 [drp] | Freq: Three times a day (TID) | OTIC | 0 refills | Status: DC

## 2016-10-12 NOTE — Telephone Encounter (Signed)
So I called the pharmacy they state it was to expensive for the patient.  They said you can try cortisporin HC?

## 2016-10-12 NOTE — Telephone Encounter (Signed)
Yes but could never get due to ins not covering?

## 2016-10-12 NOTE — Telephone Encounter (Signed)
Please contact patient's insurance company to determine the class of medications that are covered.

## 2016-10-12 NOTE — Telephone Encounter (Signed)
Prescription for Cortisporin is sent to patient's pharmacy

## 2016-10-17 ENCOUNTER — Telehealth: Payer: Self-pay | Admitting: Family Medicine

## 2016-10-17 NOTE — Telephone Encounter (Signed)
Pt states she needs refill on Colchicine usp tabs 0.6 mg, Indomethacin 25 mg caps. [facility]

## 2016-10-18 NOTE — Telephone Encounter (Signed)
Colchicine and indomethacin are not on patient's active list of medications, she needs to contact the prescribing physician or schedule an appointment to discuss these medications.

## 2016-10-19 NOTE — Telephone Encounter (Signed)
LMOM for pt to call the office °

## 2016-12-11 ENCOUNTER — Other Ambulatory Visit: Payer: Self-pay | Admitting: Family Medicine

## 2016-12-11 DIAGNOSIS — F3342 Major depressive disorder, recurrent, in full remission: Secondary | ICD-10-CM

## 2016-12-11 DIAGNOSIS — R110 Nausea: Secondary | ICD-10-CM

## 2016-12-13 DIAGNOSIS — F17200 Nicotine dependence, unspecified, uncomplicated: Secondary | ICD-10-CM | POA: Diagnosis not present

## 2016-12-13 DIAGNOSIS — Z87891 Personal history of nicotine dependence: Secondary | ICD-10-CM | POA: Diagnosis not present

## 2016-12-13 DIAGNOSIS — Z720 Tobacco use: Secondary | ICD-10-CM | POA: Diagnosis not present

## 2016-12-13 DIAGNOSIS — Z79891 Long term (current) use of opiate analgesic: Secondary | ICD-10-CM | POA: Diagnosis not present

## 2016-12-13 DIAGNOSIS — G894 Chronic pain syndrome: Secondary | ICD-10-CM | POA: Diagnosis not present

## 2016-12-13 DIAGNOSIS — Z716 Tobacco abuse counseling: Secondary | ICD-10-CM | POA: Diagnosis not present

## 2016-12-13 DIAGNOSIS — M545 Low back pain: Secondary | ICD-10-CM | POA: Diagnosis not present

## 2016-12-13 DIAGNOSIS — M5430 Sciatica, unspecified side: Secondary | ICD-10-CM | POA: Diagnosis not present

## 2016-12-17 ENCOUNTER — Other Ambulatory Visit: Payer: Self-pay | Admitting: Family Medicine

## 2016-12-18 ENCOUNTER — Ambulatory Visit: Payer: Medicare PPO

## 2016-12-19 ENCOUNTER — Ambulatory Visit (INDEPENDENT_AMBULATORY_CARE_PROVIDER_SITE_OTHER): Payer: Medicare PPO | Admitting: Family Medicine

## 2016-12-19 ENCOUNTER — Encounter: Payer: Self-pay | Admitting: Family Medicine

## 2016-12-19 VITALS — BP 118/74 | HR 87 | Temp 98.1°F | Resp 16 | Ht 69.0 in | Wt 194.7 lb

## 2016-12-19 DIAGNOSIS — G4700 Insomnia, unspecified: Secondary | ICD-10-CM | POA: Diagnosis not present

## 2016-12-19 DIAGNOSIS — Z23 Encounter for immunization: Secondary | ICD-10-CM | POA: Diagnosis not present

## 2016-12-19 DIAGNOSIS — M1A4710 Other secondary chronic gout, right ankle and foot, without tophus (tophi): Secondary | ICD-10-CM | POA: Diagnosis not present

## 2016-12-19 DIAGNOSIS — F329 Major depressive disorder, single episode, unspecified: Secondary | ICD-10-CM

## 2016-12-19 DIAGNOSIS — F419 Anxiety disorder, unspecified: Secondary | ICD-10-CM | POA: Diagnosis not present

## 2016-12-19 DIAGNOSIS — R600 Localized edema: Secondary | ICD-10-CM | POA: Diagnosis not present

## 2016-12-19 DIAGNOSIS — F32A Depression, unspecified: Secondary | ICD-10-CM

## 2016-12-19 LAB — COMPLETE METABOLIC PANEL WITH GFR
AG RATIO: 1.5 (calc) (ref 1.0–2.5)
ALT: 17 U/L (ref 6–29)
AST: 21 U/L (ref 10–35)
Albumin: 4.5 g/dL (ref 3.6–5.1)
Alkaline phosphatase (APISO): 80 U/L (ref 33–130)
BUN: 11 mg/dL (ref 7–25)
CALCIUM: 9.9 mg/dL (ref 8.6–10.4)
CO2: 26 mmol/L (ref 20–32)
CREATININE: 1.01 mg/dL (ref 0.50–1.05)
Chloride: 106 mmol/L (ref 98–110)
GFR, EST AFRICAN AMERICAN: 72 mL/min/{1.73_m2} (ref >=60)
GFR, EST NON AFRICAN AMERICAN: 62 mL/min/{1.73_m2} (ref >=60)
Globulin: 3.1 g/dL (calc) (ref 1.9–3.7)
Glucose, Bld: 125 mg/dL (ref 65–139)
POTASSIUM: 4.3 mmol/L (ref 3.5–5.3)
Sodium: 140 mmol/L (ref 135–146)
TOTAL PROTEIN: 7.6 g/dL (ref 6.1–8.1)
Total Bilirubin: 0.4 mg/dL (ref 0.2–1.2)

## 2016-12-19 LAB — URIC ACID: URIC ACID, SERUM: 5.7 mg/dL (ref 2.5–7.0)

## 2016-12-19 MED ORDER — ESCITALOPRAM OXALATE 10 MG PO TABS: 10.0000 mg | ORAL_TABLET | Freq: Every day | ORAL | 0 refills | Status: DC

## 2016-12-19 MED ORDER — FUROSEMIDE 40 MG PO TABS: 40.0000 mg | ORAL_TABLET | Freq: Every day | ORAL | 2 refills | Status: DC | PRN

## 2016-12-19 MED ORDER — COLCHICINE 0.6 MG PO TABS: 0.6000 mg | ORAL_TABLET | Freq: Two times a day (BID) | ORAL | 0 refills | Status: DC | PRN

## 2016-12-19 MED ORDER — VENLAFAXINE HCL ER 75 MG PO CP24: 75.0000 mg | ORAL_CAPSULE | Freq: Every day | ORAL | 2 refills | Status: DC

## 2016-12-19 MED ORDER — TRAZODONE HCL 50 MG PO TABS: 50.0000 mg | ORAL_TABLET | Freq: Every day | ORAL | 0 refills | Status: DC

## 2016-12-19 MED ORDER — INDOMETHACIN 25 MG PO CAPS: 25.0000 mg | ORAL_CAPSULE | Freq: Two times a day (BID) | ORAL | 0 refills | Status: DC

## 2016-12-19 NOTE — Progress Notes (Signed)
Name: Tricia [name]   MRN: [id number]    DOB: [birth year]   Date:12/19/2016       Progress Note  Subjective  Chief Complaint  Chief Complaint  Patient presents with  . Annual Exam  . Insomnia    Pt is not sleeping to good. up all night and a few hours of sleep.   . medication mangement    Pt do not think the lexapro not working. PT states she is little more depressed then usual and her nerves have got really bad   . Medication Refill    Lasix    Insomnia  Primary symptoms: difficulty falling asleep, frequent awakening.  The onset quality is gradual. The symptoms are aggravated by anxiety. Typical bedtime:  Other.  How long after going to bed to you fall asleep: 15-30 minutes.   PMH includes: depression.  Depression       The patient presents with depression.  This is a chronic problem.  The onset quality is gradual.   The problem has been gradually worsening since onset.  Associated symptoms include fatigue, insomnia, body aches and sad.  Past treatments include SSRIs - Selective serotonin reuptake inhibitors.  Compliance with treatment is good.  Risk factors include stress.   Past medical history includes depression.      Past Medical History:  Diagnosis Date  . Anxiety   . Asthma   . Cholesteatoma of left ear    x2  . Depression   . Diabetes mellitus without complication ([place])   . Diastolic dysfunction   . Hyperlipidemia   . Hypertension     Past Surgical History:  Procedure Laterality Date  . CARPAL TUNNEL RELEASE    . DILATION AND CURETTAGE OF UTERUS    . EXTERNAL EAR SURGERY    . TUBAL LIGATION      Family History  Problem Relation Age of Onset  . Emphysema Mother   . Stroke Father   . Throat cancer Father     Social History   Social History  . Marital status: Divorced    Spouse name: N/A  . Number of children: N/A  . Years of education: N/A   Occupational History  . Not on file.   Social History Main Topics  . Smoking status: Current  Every Day Smoker    Packs/day: 0.50    Years: 38.00    Types: Cigarettes  . Smokeless tobacco: Never Used  . Alcohol use No  . Drug use: No  . Sexual activity: No   Other Topics Concern  . Not on file   Social History Narrative  . No narrative on file     Current Outpatient Prescriptions:  .  albuterol (VENTOLIN HFA) 108 (90 BASE) MCG/ACT inhaler, Inhale into the lungs., Disp: , Rfl:  .  ALPRAZolam (XANAX) 1 MG tablet, Take 1 tablet (1 mg total) by mouth 2 (two) times daily as needed for anxiety. (Patient taking differently: Take 0.5 mg by mouth 2 (two) times daily as needed for anxiety. ), Disp: 60 tablet, Rfl: 0 .  baclofen (LIORESAL) 10 MG tablet, Take 10 mg by mouth 3 (three) times daily., Disp: , Rfl:  .  escitalopram (LEXAPRO) 10 MG tablet, Take 1 tablet (10 mg total) by mouth daily., Disp: 5 tablet, Rfl: 0 .  furosemide (LASIX) 40 MG tablet, Take 40 mg by mouth daily as needed. , Disp: , Rfl:  .  gabapentin (NEURONTIN) 600 MG tablet, Take 1 tablet by mouth 3 (three)  times daily., Disp: , Rfl:  .  glucose blood test strip, Use as directed to check blood glucose daily, Disp: 100 each, Rfl: 2 .  indomethacin (INDOCIN) 25 MG capsule, Take 1 capsule by mouth daily as needed., Disp: , Rfl:  .  ipratropium-albuterol (DUONEB) 0.5-2.5 (3) MG/3ML SOLN, Inhale into the lungs., Disp: , Rfl:  .  LANTUS SOLOSTAR 100 UNIT/ML Solostar Pen, INJECT 20 UNITS SUBCUTANEOUSLY ONCE DAILY AT BEDTIME, Disp: 15 mL, Rfl: 1 .  metFORMIN (GLUCOPHAGE) 1000 MG tablet, TAKE 1 TABLET (1,000 MG TOTAL) BY MOUTH 2 (TWO) TIMES DAILY WITH A MEAL., Disp: 180 tablet, Rfl: 2 .  montelukast (SINGULAIR) 10 MG tablet, Take 10 mg by mouth at bedtime. , Disp: , Rfl:  .  morphine (MS CONTIN) 15 MG 12 hr tablet, Take 1 tablet by mouth 2 (two) times daily., Disp: , Rfl:  .  naproxen (NAPROSYN) 500 MG tablet, Take 1 tablet (500 mg total) by mouth 2 (two) times daily with a meal., Disp: 60 tablet, Rfl: 0 .   neomycin-polymyxin-hydrocortisone (CORTISPORIN) OTIC solution, Place 4 drops into the left ear 3 (three) times daily., Disp: 10 mL, Rfl: 0 .  Oxycodone HCl 20 MG TABS, Take 40 mg by mouth 2 (two) times daily. , Disp: , Rfl:  .  predniSONE (DELTASONE) 5 MG tablet, Take 1 tablet by mouth daily., Disp: , Rfl: 1 .  promethazine (PHENERGAN) 25 MG tablet, TAKE 1 TABLET BY MOUTH EVERY 8 HOURS AS NEEDED, Disp: 90 tablet, Rfl: 0 .  ranitidine (ZANTAC) 150 MG tablet, Take 1 tablet by mouth 2 (two) times daily., Disp: , Rfl:  .  rizatriptan (MAXALT) 5 MG tablet, Take 5 mg by mouth as needed. , Disp: , Rfl:  .  theophylline (UNIPHYL) 400 MG 24 hr tablet, , Disp: , Rfl: 10 .  tiotropium (SPIRIVA HANDIHALER) 18 MCG inhalation capsule, Place into inhaler and inhale., Disp: , Rfl:  .  tiZANidine (ZANAFLEX) 4 MG tablet, Take 1 tablet by mouth 3 (three) times daily., Disp: , Rfl:  .  topiramate (TOPAMAX) 100 MG tablet, Take 1 tablet by mouth 2 (two) times daily., Disp: , Rfl:  .  colchicine 0.6 MG tablet, Take 1 tablet by mouth 2 (two) times daily as needed., Disp: , Rfl:   Allergies  Allergen Reactions  . Augmentin [Amoxicillin-Pot Clavulanate]   . Penicillins      Review of Systems  Constitutional: Positive for fatigue.  Psychiatric/Behavioral: Positive for depression. The patient has insomnia.      Objective  Vitals:   12/19/16 1335  BP: 118/74  Pulse: 87  Resp: 16  Temp: 98.1 F (36.7 C)  TempSrc: Oral  SpO2: 96%  Weight: 194 lb 11.2 oz (88.3 kg)  Height: 5\' 9"  (1.753 m)    Physical Exam  Constitutional: She is oriented to person, place, and time and well-developed, well-nourished, and in no distress.  HENT:  Head: Normocephalic and atraumatic.  Cardiovascular: Normal rate, regular rhythm and normal heart sounds.   No murmur heard. Pulmonary/Chest: Effort normal and breath sounds normal. She has no wheezes.  Musculoskeletal: She exhibits no edema.  Neurological: She is alert and  oriented to person, place, and time.  Psychiatric: Mood, memory, affect and judgment normal.  Nursing note and vitals reviewed.     Assessment & Plan  1. Needs flu shot  - Flu Vaccine QUAD 6+ mos PF IM (Fluarix Quad PF)  2. Anxiety and depression D/C Lexapro by decreasing to 10 mg for  5 days and then stop, start on Venlafaxine XR 75 mg daily afterwards. - venlafaxine XR (EFFEXOR-XR) 75 MG 24 hr capsule; Take 1 capsule (75 mg total) by mouth daily with breakfast.  Dispense: 30 capsule; Refill: 2 - escitalopram (LEXAPRO) 10 MG tablet; Take 1 tablet (10 mg total) by mouth daily.  Dispense: 5 tablet; Refill: 0  3. Insomnia, unspecified type May take Trazodone 50 mg at bedtime for insomnia. - traZODone (DESYREL) 50 MG tablet; Take 1 tablet (50 mg total) by mouth at bedtime.  Dispense: 90 tablet; Refill: 0  4. 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  Dispense: 60 capsule; Refill: 0 - colchicine 0.6 MG tablet; Take 1 tablet (0.6 mg total) by mouth 2 (two) times daily as needed.  Dispense: 60 tablet; Refill: 0 - Uric acid - COMPLETE METABOLIC PANEL WITH GFR  5. Bilateral leg edema  - furosemide (LASIX) 40 MG tablet; Take 1 tablet (40 mg total) by mouth daily as needed.  Dispense: 30 tablet; Refill: 2  Maddyn Lieurance [facility] 12/19/2016 2:04 PM

## 2016-12-19 NOTE — Progress Notes (Signed)
The following letter was provided to Tricia [name] during their visit today: ---------------------------------------  Dear valued [facility] Patient,  I am writing to share that as of April 05, 2017, I will no longer be seeing patients at [facility] While it has been my privilege to care for you as a physician, I have decided to move outside of [place] to pursue other opportunities.  The staff at [facility] has been supportive of my decision and are supportive of any patients who have been under my care.  They will be happy to provide care to you and your family.  The office staff will do everything they can to ensure a seamless transition of care at [facility].  However if you are on any controlled substance medications (i.e. Pain medication or benzodiazepines), they will no longer be able to refill those medications, but we will be more than happy to refer you to a specialist.  [facility] will also assist you with the transfer of medical records should you wish to seek care elsewhere.  If you have any questions about your future care, you may call the office at [phone].  I have enjoyed getting to know my patients here and I wish you the very best.  Sincerely,  Rochel Brome, MD  ---------------------------------------  A written copy of this letter was given to the patient.  The patient verbalizes understanding of the letter, and does request referral to specialist or to new primary care provider.  This decision has been conveyed to Dr. Manuella Ghazi, who will place any appropriate referrals during today's visit.

## 2017-01-01 ENCOUNTER — Ambulatory Visit (INDEPENDENT_AMBULATORY_CARE_PROVIDER_SITE_OTHER): Payer: Medicare PPO

## 2017-01-01 VITALS — BP 124/66 | HR 88 | Temp 98.1°F | Resp 16 | Ht 69.0 in | Wt 202.1 lb

## 2017-01-01 DIAGNOSIS — Z0000 Encounter for general adult medical examination without abnormal findings: Secondary | ICD-10-CM

## 2017-01-01 DIAGNOSIS — Z1231 Encounter for screening mammogram for malignant neoplasm of breast: Secondary | ICD-10-CM

## 2017-01-01 DIAGNOSIS — Z1159 Encounter for screening for other viral diseases: Secondary | ICD-10-CM

## 2017-01-01 DIAGNOSIS — Z1211 Encounter for screening for malignant neoplasm of colon: Secondary | ICD-10-CM

## 2017-01-01 DIAGNOSIS — Z114 Encounter for screening for human immunodeficiency virus [HIV]: Secondary | ICD-10-CM | POA: Diagnosis not present

## 2017-01-01 DIAGNOSIS — Z1239 Encounter for other screening for malignant neoplasm of breast: Secondary | ICD-10-CM

## 2017-01-01 NOTE — Patient Instructions (Addendum)
Tricia [name] , Thank you for taking time to come for your Medicare Wellness Visit. I appreciate your ongoing commitment to your health goals. Please review the following plan we discussed and let me know if I can assist you in the future.   Screening recommendations/referrals: Colonoscopy: Referral ordered today. Our office will call you with an appointment date and time. Mammogram: Ordered today. Please call [phone] to schedule your mammogram.  Bone Density: Completed 10/23/11. Recommended yearly ophthalmology/optometry visit for glaucoma screening and checkup Recommended yearly dental visit for hygiene and checkup  Vaccinations: Influenza vaccine: Up to date Pneumococcal vaccine: Up to date Tdap vaccine: Up to date Shingles vaccine: Declined. Please call your insurance company to determine your out of pocket expense.  Advanced directives: Advance directive discussed with you today. I have provided a copy for you to complete at home and have notarized. Once this is complete please bring a copy in to our office so we can scan it into your chart.  Conditions/risks identified: Reduce or eliminate sweets and carbs from diet  Next appointment: You are scheduled to see Dr. Manuella Ghazi on 01/21/17 @ 2pm.  Please schedule your annual wellness exam with your Nurse Health Advisor in one year.   Preventive Care 40-64 Years, Female Preventive care refers to lifestyle choices and visits with your health care provider that can promote health and wellness. What does preventive care include?  A yearly physical exam. This is also called an annual well check.  Dental exams once or twice a year.  Routine eye exams. Ask your health care provider how often you should have your eyes checked.  Personal lifestyle choices, including:  Daily care of your teeth and gums.  Regular physical activity.  Eating a healthy diet.  Avoiding tobacco and drug use.  Limiting alcohol use.  Practicing safe  sex.  Taking low-dose aspirin daily starting at age 88.  Taking vitamin and mineral supplements as recommended by your health care provider. What happens during an annual well check? The services and screenings done by your health care provider during your annual well check will depend on your age, overall health, lifestyle risk factors, and family history of disease. Counseling  Your health care provider may ask you questions about your:  Alcohol use.  Tobacco use.  Drug use.  Emotional well-being.  Home and relationship well-being.  Sexual activity.  Eating habits.  Work and work Statistician.  Method of birth control.  Menstrual cycle.  Pregnancy history. Screening  You may have the following tests or measurements:  Height, weight, and BMI.  Blood pressure.  Lipid and cholesterol levels. These may be checked every 5 years, or more frequently if you are over 71 years old.  Skin check.  Lung cancer screening. You may have this screening every year starting at age 79 if you have a 30-pack-year history of smoking and currently smoke or have quit within the past 15 years.  Fecal occult blood test (FOBT) of the stool. You may have this test every year starting at age 44.  Flexible sigmoidoscopy or colonoscopy. You may have a sigmoidoscopy every 5 years or a colonoscopy every 10 years starting at age 38.  Hepatitis C blood test.  Hepatitis B blood test.  Sexually transmitted disease (STD) testing.  Diabetes screening. This is done by checking your blood sugar (glucose) after you have not eaten for a while (fasting). You may have this done every 1-3 years.  Mammogram. This may be done every 1-2 years. Talk  to your health care provider about when you should start having regular mammograms. This may depend on whether you have a family history of breast cancer.  BRCA-related cancer screening. This may be done if you have a family history of breast, ovarian, tubal, or  peritoneal cancers.  Pelvic exam and Pap test. This may be done every 3 years starting at age 71. Starting at age 13, this may be done every 5 years if you have a Pap test in combination with an HPV test.  Bone density scan. This is done to screen for osteoporosis. You may have this scan if you are at high risk for osteoporosis. Discuss your test results, treatment options, and if necessary, the need for more tests with your health care provider. Vaccines  Your health care provider may recommend certain vaccines, such as:  Influenza vaccine. This is recommended every year.  Tetanus, diphtheria, and acellular pertussis (Tdap, Td) vaccine. You may need a Td booster every 10 years.  Zoster vaccine. You may need this after age 41.  Pneumococcal 13-valent conjugate (PCV13) vaccine. You may need this if you have certain conditions and were not previously vaccinated.  Pneumococcal polysaccharide (PPSV23) vaccine. You may need one or two doses if you smoke cigarettes or if you have certain conditions. Talk to your health care provider about which screenings and vaccines you need and how often you need them. This information is not intended to replace advice given to you by your health care provider. Make sure you discuss any questions you have with your health care provider. Document Released: 03/04/2015 Document Revised: 10/26/2015 Document Reviewed: 12/07/2014 Elsevier Interactive Patient Education  2017 [place] Prevention in the Home Falls can cause injuries. They can happen to people of all ages. There are many things you can do to make your home safe and to help prevent falls. What can I do on the outside of my home?  Regularly fix the edges of walkways and driveways and fix any cracks.  Remove anything that might make you trip as you walk through a door, such as a raised step or threshold.  Trim any bushes or trees on the path to your home.  Use bright outdoor  lighting.  Clear any walking paths of anything that might make someone trip, such as rocks or tools.  Regularly check to see if handrails are loose or broken. Make sure that both sides of any steps have handrails.  Any raised decks and porches should have guardrails on the edges.  Have any leaves, snow, or ice cleared regularly.  Use sand or salt on walking paths during winter.  Clean up any spills in your garage right away. This includes oil or grease spills. What can I do in the bathroom?  Use night lights.  Install grab bars by the toilet and in the tub and shower. Do not use towel bars as grab bars.  Use non-skid mats or decals in the tub or shower.  If you need to sit down in the shower, use a plastic, non-slip stool.  Keep the floor dry. Clean up any water that spills on the floor as soon as it happens.  Remove soap buildup in the tub or shower regularly.  Attach bath mats securely with double-sided non-slip rug tape.  Do not have throw rugs and other things on the floor that can make you trip. What can I do in the bedroom?  Use night lights.  Make sure that you  have a light by your bed that is easy to reach.  Do not use any sheets or blankets that are too big for your bed. They should not hang down onto the floor.  Have a firm chair that has side arms. You can use this for support while you get dressed.  Do not have throw rugs and other things on the floor that can make you trip. What can I do in the kitchen?  Clean up any spills right away.  Avoid walking on wet floors.  Keep items that you use a lot in easy-to-reach places.  If you need to reach something above you, use a strong step stool that has a grab bar.  Keep electrical cords out of the way.  Do not use floor polish or wax that makes floors slippery. If you must use wax, use non-skid floor wax.  Do not have throw rugs and other things on the floor that can make you trip. What can I do with my  stairs?  Do not leave any items on the stairs.  Make sure that there are handrails on both sides of the stairs and use them. Fix handrails that are broken or loose. Make sure that handrails are as long as the stairways.  Check any carpeting to make sure that it is firmly attached to the stairs. Fix any carpet that is loose or worn.  Avoid having throw rugs at the top or bottom of the stairs. If you do have throw rugs, attach them to the floor with carpet tape.  Make sure that you have a light switch at the top of the stairs and the bottom of the stairs. If you do not have them, ask someone to add them for you. What else can I do to help prevent falls?  Wear shoes that:  Do not have high heels.  Have rubber bottoms.  Are comfortable and fit you well.  Are closed at the toe. Do not wear sandals.  If you use a stepladder:  Make sure that it is fully opened. Do not climb a closed stepladder.  Make sure that both sides of the stepladder are locked into place.  Ask someone to hold it for you, if possible.  Clearly mark and make sure that you can see:  Any grab bars or handrails.  First and last steps.  Where the edge of each step is.  Use tools that help you move around (mobility aids) if they are needed. These include:  Canes.  Walkers.  Scooters.  Crutches.  Turn on the lights when you go into a dark area. Replace any light bulbs as soon as they burn out.  Set up your furniture so you have a clear path. Avoid moving your furniture around.  If any of your floors are uneven, fix them.  If there are any pets around you, be aware of where they are.  Review your medicines with your doctor. Some medicines can make you feel dizzy. This can increase your chance of falling. Ask your doctor what other things that you can do to help prevent falls. This information is not intended to replace advice given to you by your health care provider. Make sure you discuss any  questions you have with your health care provider. Document Released: 12/02/2008 Document Revised: 07/14/2015 Document Reviewed: 03/12/2014 Elsevier Interactive Patient Education  2017 [employer].   Steps to Quit Smoking Smoking tobacco can be bad for your health. It can also affect almost every  organ in your body. Smoking puts you and people around you at risk for many serious long-lasting (chronic) diseases. Quitting smoking is hard, but it is one of the best things that you can do for your health. It is never too late to quit. What are the benefits of quitting smoking? When you quit smoking, you lower your risk for getting serious diseases and conditions. They can include:  Lung cancer or lung disease.  Heart disease.  Stroke.  Heart attack.  Not being able to have children (infertility).  Weak bones (osteoporosis) and broken bones (fractures).  If you have coughing, wheezing, and shortness of breath, those symptoms may get better when you quit. You may also get sick less often. If you are pregnant, quitting smoking can help to lower your chances of having a baby of low birth weight. What can I do to help me quit smoking? Talk with your doctor about what can help you quit smoking. Some things you can do (strategies) include:  Quitting smoking totally, instead of slowly cutting back how much you smoke over a period of time.  Going to in-person counseling. You are more likely to quit if you go to many counseling sessions.  Using resources and support systems, such as: ? Database administrator with a Social worker. ? Phone quitlines. ? Careers information officer. ? Support groups or group counseling. ? Text messaging programs. ? Mobile phone apps or applications.  Taking medicines. Some of these medicines may have nicotine in them. If you are pregnant or breastfeeding, do not take any medicines to quit smoking unless your doctor says it is okay. Talk with your doctor about counseling or  other things that can help you.  Talk with your doctor about using more than one strategy at the same time, such as taking medicines while you are also going to in-person counseling. This can help make quitting easier. What things can I do to make it easier to quit? Quitting smoking might feel very hard at first, but there is a lot that you can do to make it easier. Take these steps:  Talk to your family and friends. Ask them to support and encourage you.  Call phone quitlines, reach out to support groups, or work with a Social worker.  Ask people who smoke to not smoke around you.  Avoid places that make you want (trigger) to smoke, such as: ? Bars. ? Parties. ? Smoke-break areas at work.  Spend time with people who do not smoke.  Lower the stress in your life. Stress can make you want to smoke. Try these things to help your stress: ? Getting regular exercise. ? Deep-breathing exercises. ? Yoga. ? Meditating. ? Doing a body scan. To do this, close your eyes, focus on one area of your body at a time from head to toe, and notice which parts of your body are tense. Try to relax the muscles in those areas.  Download or buy apps on your mobile phone or tablet that can help you stick to your quit plan. There are many free apps, such as QuitGuide from the [employer] Office manager for Disease Control and Prevention). You can find more support from smokefree.gov and other websites.  This information is not intended to replace advice given to you by your health care provider. Make sure you discuss any questions you have with your health care provider. Document Released: 12/02/2008 Document Revised: 10/04/2015 Document Reviewed: 06/22/2014 Elsevier Interactive Patient Education  2018 [employer].

## 2017-01-01 NOTE — Progress Notes (Signed)
Subjective:   Tricia [name] is a 56 y.o. female who presents for Medicare Annual (Subsequent) preventive examination.  Review of Systems:  N/A Cardiac Risk Factors include: diabetes mellitus;dyslipidemia;hypertension;sedentary lifestyle;smoking/ tobacco exposure     Objective:     Vitals: BP 124/66 (BP Location: Left Arm, Patient Position: Sitting, Cuff Size: Normal)   Pulse 88   Temp 98.1 F (36.7 C) (Oral)   Resp 16   Ht 5\' 9"  (1.753 m)   Wt 202 lb 1.6 oz (91.7 kg)   BMI 29.84 kg/m   Body mass index is 29.84 kg/m.   Tobacco Social History   Tobacco Use  Smoking Status Current Every Day Smoker  . Packs/day: 0.50  . Years: 38.00  . Pack years: 19.00  . Types: Cigarettes  Smokeless Tobacco Never Used     Ready to quit: Yes Counseling given: Yes   Past Medical History:  Diagnosis Date  . Anxiety   . Asthma   . Cholesteatoma of left ear    x2  . Depression   . Diabetes mellitus without complication ([place])   . Diastolic dysfunction   . Hyperlipidemia   . Hypertension    Past Surgical History:  Procedure Laterality Date  . CARPAL TUNNEL RELEASE    . DILATION AND CURETTAGE OF UTERUS    . EXTERNAL EAR SURGERY    . TUBAL LIGATION     Family History  Problem Relation Age of Onset  . Emphysema Mother   . Stroke Father   . Throat cancer Father   . Multiple sclerosis Daughter   . Cervical cancer Daughter    Social History   Substance and Sexual Activity  Sexual Activity No    Outpatient Encounter Medications as of 01/01/2017  Medication Sig  . albuterol (VENTOLIN HFA) 108 (90 BASE) MCG/ACT inhaler Inhale into the lungs.  . ALPRAZolam (XANAX) 1 MG tablet Take 1 tablet (1 mg total) by mouth 2 (two) times daily as needed for anxiety. (Patient taking differently: Take 0.5 mg by mouth 2 (two) times daily as needed for anxiety. )  . gabapentin (NEURONTIN) 600 MG tablet Take 1 tablet by mouth 3 (three) times daily.  Marland Kitchen glucose blood test strip Use  as directed to check blood glucose daily  . ipratropium-albuterol (DUONEB) 0.5-2.5 (3) MG/3ML SOLN Inhale into the lungs.  . metFORMIN (GLUCOPHAGE) 1000 MG tablet TAKE 1 TABLET (1,000 MG TOTAL) BY MOUTH 2 (TWO) TIMES DAILY WITH A MEAL.  . montelukast (SINGULAIR) 10 MG tablet Take 10 mg by mouth at bedtime.   Marland Kitchen morphine (MS CONTIN) 15 MG 12 hr tablet Take 1 tablet by mouth 2 (two) times daily.  . Oxycodone HCl 20 MG TABS Take 40 mg by mouth 2 (two) times daily.   . promethazine (PHENERGAN) 25 MG tablet TAKE 1 TABLET BY MOUTH EVERY 8 HOURS AS NEEDED  . ranitidine (ZANTAC) 150 MG tablet Take 1 tablet by mouth 2 (two) times daily.  . rizatriptan (MAXALT) 5 MG tablet Take 5 mg by mouth as needed.   . theophylline (UNIPHYL) 400 MG 24 hr tablet   . tiotropium (SPIRIVA HANDIHALER) 18 MCG inhalation capsule Place into inhaler and inhale.  Marland Kitchen tiZANidine (ZANAFLEX) 4 MG tablet Take 1 tablet by mouth 3 (three) times daily.  Marland Kitchen topiramate (TOPAMAX) 100 MG tablet Take 1 tablet by mouth 2 (two) times daily.  . traZODone (DESYREL) 50 MG tablet Take 1 tablet (50 mg total) by mouth at bedtime.  Marland Kitchen venlafaxine XR (EFFEXOR-XR)  75 MG 24 hr capsule Take 1 capsule (75 mg total) by mouth daily with breakfast.  . baclofen (LIORESAL) 10 MG tablet Take 10 mg by mouth 3 (three) times daily.  . colchicine 0.6 MG tablet Take 1 tablet (0.6 mg total) by mouth 2 (two) times daily as needed. (Patient not taking: Reported on 01/01/2017)  . escitalopram (LEXAPRO) 10 MG tablet Take 1 tablet (10 mg total) by mouth daily.  . furosemide (LASIX) 40 MG tablet Take 1 tablet (40 mg total) by mouth daily as needed. (Patient not taking: Reported on 01/01/2017)  . indomethacin (INDOCIN) 25 MG capsule Take 1 capsule (25 mg total) by mouth 2 (two) times daily with a meal. (Patient not taking: Reported on 01/01/2017)  . LANTUS SOLOSTAR 100 UNIT/ML Solostar Pen INJECT 20 UNITS SUBCUTANEOUSLY ONCE DAILY AT BEDTIME (Patient not taking: Reported on  01/01/2017)  . naproxen (NAPROSYN) 500 MG tablet Take 1 tablet (500 mg total) by mouth 2 (two) times daily with a meal. (Patient not taking: Reported on 01/01/2017)  . neomycin-polymyxin-hydrocortisone (CORTISPORIN) OTIC solution Place 4 drops into the left ear 3 (three) times daily. (Patient not taking: Reported on 01/01/2017)  . predniSONE (DELTASONE) 5 MG tablet Take 1 tablet by mouth daily.   No facility-administered encounter medications on file as of 01/01/2017.     Activities of Daily Living In your present state of health, do you have any difficulty performing the following activities: 01/01/2017 12/19/2016  Hearing? N N  Vision? Y N  Comment in need of new Rx eye glasses. States she will schedule an appt by the end of the week -  Difficulty concentrating or making decisions? Y N  Comment short term memory loss -  Walking or climbing stairs? Y N  Comment feels SOB and has joint pain -  Dressing or bathing? N N  Doing errands, shopping? N N  Preparing Food and eating ? N -  Using the Toilet? N -  In the past six months, have you accidently leaked urine? Y -  Comment stress incontinence -  Do you have problems with loss of bowel control? N -  Managing your Medications? N -  Managing your Finances? N -  Housekeeping or managing your Housekeeping? N -  Some recent data might be hidden    Patient Care Team: [name], MD as PCP - General (Family Medicine) Erby Pian, MD as Consulting Physician (Specialist) [name], MD as Consulting Physician (Neurology) [name], MD as Consulting Physician (Anesthesiology)    Assessment:     Exercise Activities and Dietary recommendations Current Exercise Habits: The patient does not participate in regular exercise at present, Exercise limited by: psychological condition(s)(Major depressive disorder)  Goals    None     Fall Risk: TUG test 11sec. No intervention required Fall Risk  01/01/2017 12/19/2016  09/18/2016 06/19/2016 05/05/2015  Falls in the past year? Yes No No No No  Number falls in past yr: 2 or more - - - -  Injury with Fall? No - - - -  Comment - - - - -  Risk for fall due to : Impaired vision;Medication side effect - - - -  Risk for fall due to: Comment in need of new Rx eye glasses - - - -  Follow up Education provided;Falls prevention discussed - - - -   Depression Screen PHQ 2/9 Scores 01/01/2017 12/19/2016 09/18/2016 06/19/2016  PHQ - 2 Score 3 0 0 0  PHQ- 9  Score 17 - - -     Cognitive Function     6CIT Screen 01/01/2017  What Year? 0 points  What month? 0 points  What time? 0 points  Count back from 20 0 points  Months in reverse 0 points  Repeat phrase 0 points  Total Score 0    Immunization History  Administered Date(s) Administered  . Influenza,inj,Quad PF,6+ Mos 01/24/2015, 12/19/2016  . Influenza-Unspecified 12/09/2015  . Pneumococcal Polysaccharide-23 02/19/2009  . Tdap 12/09/2012   Screening Tests Health Maintenance  Topic Date Due  . Hepatitis C Screening  22-Nov-1960  . HIV Screening  09/19/1975  . MAMMOGRAM  09/19/1978  . FOOT EXAM  01/21/2017 (Originally 09/19/1970)  . PAP SMEAR  01/21/2017 (Originally 09/18/1981)  . OPHTHALMOLOGY EXAM  01/21/2017 (Originally 09/19/1970)  . URINE MICROALBUMIN  01/21/2017 (Originally 07/05/2016)  . PNEUMOCOCCAL POLYSACCHARIDE VACCINE (2) 09/19/2026 (Originally 02/19/2014)  . HEMOGLOBIN A1C  03/21/2017  . COLONOSCOPY  11/08/2021  . TETANUS/TDAP  12/10/2022  . INFLUENZA VACCINE  Completed      Plan:   I have personally reviewed and addressed the Medicare Annual Wellness questionnaire and have noted the following in the patient's chart:  A. Medical and social history B. Use of alcohol, tobacco or illicit drugs  C. Current medications and supplements D. Functional ability and status E.  Nutritional status F.  Physical activity G. Advance directives and Code Status: Pt provided with MOST and DNR documents to  review and to complete. Advised to return completed documents to our office to ensure proper communication of his/her needs to his/her healthcare team. H. List of other physicians I.  Hospitalizations, surgeries, and ER visits in previous 12 months J.  [place] such as hearing and vision if needed, cognitive and depression L. Referrals and appointments - none  In addition, I have reviewed and discussed with patient certain preventive protocols, quality metrics, and best practice recommendations. A written personalized care plan for preventive services as well as general preventive health recommendations were provided to patient.  See attached scanned questionnaire for additional information.   Signed,  Aleatha Borer, LPN Nurse Health Advisor  Nurse Notes: BHA1 score = 17  Recommendations for Immunizations per CDC guidelines:  Vaccinations: Influenza vaccine: Completed 12/19/16 Pneumococcal vaccine: Completed PPSV23 02/19/2009 Tdap vaccine: Completed 12/09/12 Shingles vaccine: Declined. Pt advised to call her insurance company to determine her out of pocket expense.  Recommendations for Health Maintenance Screenings:  Screenings: Colonoscopy: Referral made to GI for colon cancer screening Mammogram: Ordered today. Pt provided with contact info for [facility] for self scheduling Bone Density: Completed 10/23/11 HIV Screening: Ordered today. Declined offer to have labs drawn today. Pt given lab requisition for completion. States she would like to have labs drawn at next OV Hep C Screening: Ordered today. Declined offer to have labs drawn today. Pt given lab requisition for completion. States she would like to have labs drawn at next OV Recommended yearly ophthalmology/optometry visit for glaucoma screening and checkup Recommended yearly dental visit for hygiene and checkup

## 2017-01-16 ENCOUNTER — Telehealth: Payer: Self-pay

## 2017-01-16 NOTE — Telephone Encounter (Signed)
Copied from [place] [id number]. Topic: Quick Communication - Rx Refill/Question >> Jan 16, 2017 12:44 PM Tye [place] wrote: Thurmond Butts from [facility] called to state the pt is looking for trazadone and venlafaxine Contact ryan: [phone] Fax: [phone]

## 2017-01-21 ENCOUNTER — Encounter: Payer: Medicare PPO | Admitting: Family Medicine

## 2017-01-21 DIAGNOSIS — J449 Chronic obstructive pulmonary disease, unspecified: Secondary | ICD-10-CM | POA: Diagnosis not present

## 2017-01-21 DIAGNOSIS — R0609 Other forms of dyspnea: Secondary | ICD-10-CM | POA: Diagnosis not present

## 2017-01-24 ENCOUNTER — Other Ambulatory Visit: Payer: Self-pay | Admitting: Family Medicine

## 2017-01-24 DIAGNOSIS — R110 Nausea: Secondary | ICD-10-CM

## 2017-01-25 ENCOUNTER — Telehealth: Payer: Self-pay | Admitting: Family Medicine

## 2017-01-25 NOTE — Telephone Encounter (Signed)
Copied from [place] [id number]. Topic: Quick Communication - Rx Refill/Question >> Jan 25, 2017  1:46 PM Yvette Rack wrote: Has the patient contacted their pharmacy? yes  (Agent: If no, request that the patient contact the pharmacy for the refill.)  Preferred Pharmacy (with phone number or street name): [facility] [phone] for some of the  Effexor XR  pills and [employer] [facility] delivery [phone] for the rest prescriptions   Agent: Please be advised that RX refills may take up to 3 business days. We ask that you follow-up with your pharmacy.

## 2017-01-25 NOTE — Telephone Encounter (Signed)
Copied from [address] [id number]. Topic: General - Other >> Jan 25, 2017  9:33 AM Darl Householder, RMA wrote: Reason for CRM: Medication refill request for Promethazine 25 mg to be sent to [facility], pharmacy has faxed over request also

## 2017-01-25 NOTE — Telephone Encounter (Signed)
Please see the three request for her refill on her medications. She is out on some and almost out on some.

## 2017-01-27 NOTE — Telephone Encounter (Signed)
I received a request for refill of promethazine which was sent to her pharmacy.  If she needs any other medications, she needs to contact her pharmacy

## 2017-01-28 ENCOUNTER — Encounter: Payer: Medicare PPO | Admitting: Family Medicine

## 2017-01-28 NOTE — Telephone Encounter (Signed)
Medication refill. Thanks. 

## 2017-01-29 NOTE — Telephone Encounter (Signed)
Pt medication was approved and sent to her local pharmacy

## 2017-01-29 NOTE — Telephone Encounter (Signed)
Pt calling to check on meds. Pt completely out

## 2017-01-31 ENCOUNTER — Telehealth: Payer: Self-pay | Admitting: Family Medicine

## 2017-01-31 NOTE — Telephone Encounter (Signed)
Copied from [place] [id number]. Topic: Quick Communication - See Telephone Encounter >> Jan 31, 2017 10:32 AM Cleaster Corin, NT wrote: CRM for notification. See Telephone encounter for:   01/31/17. Hiren calling from [place] to see if fax is received for pt. Abput statin medication and to follow up. Hiren can be reached at [phone]

## 2017-02-01 NOTE — Telephone Encounter (Signed)
Reviewed, patient should be started on low-dose rosuvastatin. If patient has no history of statin allergy or intolerance, she may be started on rosuvastatin 5 mg by mouth daily at bedtime #90, 0 refills.

## 2017-02-01 NOTE — Telephone Encounter (Signed)
Form was faxed as well. Forms are on your desk for review

## 2017-02-04 NOTE — Telephone Encounter (Signed)
Not able to leave a message received a busy dial tone after the phone rung couple of times.

## 2017-02-20 ENCOUNTER — Encounter: Payer: Self-pay | Admitting: Family Medicine

## 2017-02-20 ENCOUNTER — Telehealth: Payer: Self-pay

## 2017-02-20 ENCOUNTER — Ambulatory Visit (INDEPENDENT_AMBULATORY_CARE_PROVIDER_SITE_OTHER): Payer: Medicare PPO | Admitting: Family Medicine

## 2017-02-20 VITALS — BP 112/60 | HR 82 | Temp 98.5°F | Resp 16 | Ht 69.0 in | Wt 197.8 lb

## 2017-02-20 DIAGNOSIS — F3341 Major depressive disorder, recurrent, in partial remission: Secondary | ICD-10-CM

## 2017-02-20 DIAGNOSIS — S90425A Blister (nonthermal), left lesser toe(s), initial encounter: Secondary | ICD-10-CM

## 2017-02-20 DIAGNOSIS — E7800 Pure hypercholesterolemia, unspecified: Secondary | ICD-10-CM

## 2017-02-20 DIAGNOSIS — E1142 Type 2 diabetes mellitus with diabetic polyneuropathy: Secondary | ICD-10-CM

## 2017-02-20 DIAGNOSIS — L089 Local infection of the skin and subcutaneous tissue, unspecified: Secondary | ICD-10-CM | POA: Diagnosis not present

## 2017-02-20 LAB — POCT GLYCOSYLATED HEMOGLOBIN (HGB A1C): Hemoglobin A1C: 7.1

## 2017-02-20 MED ORDER — VENLAFAXINE HCL ER 75 MG PO CP24: 75.0000 mg | ORAL_CAPSULE | Freq: Every day | ORAL | 0 refills | Status: DC

## 2017-02-20 MED ORDER — DOXYCYCLINE HYCLATE 100 MG PO TABS: 100.0000 mg | ORAL_TABLET | Freq: Two times a day (BID) | ORAL | 0 refills | Status: AC

## 2017-02-20 MED ORDER — ROSUVASTATIN CALCIUM 5 MG PO TABS
5.0000 mg | ORAL_TABLET | Freq: Every day | ORAL | 0 refills | Status: DC
Start: 2017-02-20 — End: 2017-06-21

## 2017-02-20 NOTE — Progress Notes (Signed)
Name: Tricia [name]   MRN: [id number]    DOB: 05-Aug-1960   Date:02/20/2017       Progress Note  Subjective  Chief Complaint  Chief Complaint  Patient presents with  . Annual Exam    Diabetes  She presents for her follow-up diabetic visit. She has type 2 diabetes mellitus. Her disease course has been improving. There are no hypoglycemic associated symptoms. Associated symptoms include polydipsia. Pertinent negatives for diabetes include no chest pain, no fatigue, no foot paresthesias and no polyuria. There are no hypoglycemic complications. Pertinent negatives for diabetic complications include no CVA or heart disease. Current diabetic treatment includes oral agent (monotherapy) and intensive insulin program. Her weight is stable. She is following a generally healthy and diabetic diet. She monitors blood glucose at home 1-2 x per day. Her breakfast blood glucose range is generally 110-130 mg/dl. Eye exam is not current.  Hyperlipidemia  This is a chronic problem. The problem is uncontrolled. Recent lipid tests were reviewed and are high. Exacerbating diseases include diabetes. Pertinent negatives include no chest pain. She is currently on no antihyperlipidemic treatment.  Depression       The patient presents with depression.  This is a chronic problem.  The onset quality is gradual.   The problem has been gradually improving since onset.  Associated symptoms include helplessness and hopelessness.  Associated symptoms include no fatigue, no appetite change and not sad.  Past treatments include SNRIs - Serotonin and norepinephrine reuptake inhibitors.  Compliance with treatment is good.  Past medical history includes depression.      Past Medical History:  Diagnosis Date  . Anxiety   . Asthma   . Cholesteatoma of left ear    x2  . Depression   . Diabetes mellitus without complication ([place])   . Diastolic dysfunction   . Hyperlipidemia   . Hypertension     Past Surgical History:   Procedure Laterality Date  . CARPAL TUNNEL RELEASE    . DILATION AND CURETTAGE OF UTERUS    . EXTERNAL EAR SURGERY    . TUBAL LIGATION      Family History  Problem Relation Age of Onset  . Emphysema Mother   . Stroke Father   . Throat cancer Father   . Multiple sclerosis Daughter   . Cervical cancer Daughter     Social History   Socioeconomic History  . Marital status: Divorced    Spouse name: Not on file  . Number of children: 2  . Years of education: Not on file  . Highest education level: Not on file  Social Needs  . Financial resource strain: Not very hard  . Food insecurity - worry: Never true  . Food insecurity - inability: Never true  . Transportation needs - medical: No  . Transportation needs - non-medical: No  Occupational History  . Occupation: Disability  Tobacco Use  . Smoking status: Current Every Day Smoker    Packs/day: 0.50    Years: 38.00    Pack years: 19.00    Types: Cigarettes  . Smokeless tobacco: Never Used  Substance and Sexual Activity  . Alcohol use: No    Alcohol/week: 0.0 oz  . Drug use: No  . Sexual activity: No  Other Topics Concern  . Not on file  Social History Narrative  . Not on file     Current Outpatient Medications:  .  albuterol (VENTOLIN HFA) 108 (90 BASE) MCG/ACT inhaler, Inhale into the lungs., Disp: , Rfl:  .  ALPRAZolam (XANAX) 1 MG tablet, Take 1 tablet (1 mg total) by mouth 2 (two) times daily as needed for anxiety. (Patient taking differently: Take 0.5 mg by mouth 2 (two) times daily as needed for anxiety. ), Disp: 60 tablet, Rfl: 0 .  baclofen (LIORESAL) 10 MG tablet, Take 10 mg by mouth 3 (three) times daily., Disp: , Rfl:  .  furosemide (LASIX) 40 MG tablet, Take 1 tablet (40 mg total) by mouth daily as needed., Disp: 30 tablet, Rfl: 2 .  gabapentin (NEURONTIN) 600 MG tablet, Take 1 tablet by mouth 3 (three) times daily., Disp: , Rfl:  .  glucose blood test strip, Use as directed to check blood glucose daily,  Disp: 100 each, Rfl: 2 .  indomethacin (INDOCIN) 25 MG capsule, Take 1 capsule (25 mg total) by mouth 2 (two) times daily with a meal., Disp: 60 capsule, Rfl: 0 .  ipratropium-albuterol (DUONEB) 0.5-2.5 (3) MG/3ML SOLN, Inhale into the lungs., Disp: , Rfl:  .  metFORMIN (GLUCOPHAGE) 1000 MG tablet, TAKE 1 TABLET (1,000 MG TOTAL) BY MOUTH 2 (TWO) TIMES DAILY WITH A MEAL., Disp: 180 tablet, Rfl: 2 .  montelukast (SINGULAIR) 10 MG tablet, Take 10 mg by mouth at bedtime. , Disp: , Rfl:  .  morphine (MS CONTIN) 15 MG 12 hr tablet, Take 1 tablet by mouth 2 (two) times daily., Disp: , Rfl:  .  naproxen (NAPROSYN) 500 MG tablet, Take 1 tablet (500 mg total) by mouth 2 (two) times daily with a meal., Disp: 60 tablet, Rfl: 0 .  Oxycodone HCl 20 MG TABS, Take 40 mg by mouth 2 (two) times daily. , Disp: , Rfl:  .  pantoprazole (PROTONIX) 40 MG tablet, Take 40 mg by mouth daily as needed., Disp: , Rfl:  .  promethazine (PHENERGAN) 25 MG tablet, TAKE 1 TABLET BY MOUTH EVERY 8 HOURS AS NEEDED, Disp: 90 tablet, Rfl: 0 .  ranitidine (ZANTAC) 150 MG tablet, Take 1 tablet by mouth 2 (two) times daily., Disp: , Rfl:  .  rizatriptan (MAXALT) 5 MG tablet, Take 5 mg by mouth as needed. , Disp: , Rfl:  .  theophylline (UNIPHYL) 400 MG 24 hr tablet, , Disp: , Rfl: 10 .  tiotropium (SPIRIVA HANDIHALER) 18 MCG inhalation capsule, Place into inhaler and inhale., Disp: , Rfl:  .  tiZANidine (ZANAFLEX) 4 MG tablet, Take 1 tablet by mouth 3 (three) times daily., Disp: , Rfl:  .  topiramate (TOPAMAX) 100 MG tablet, Take 1 tablet by mouth 2 (two) times daily., Disp: , Rfl:  .  traZODone (DESYREL) 50 MG tablet, Take 1 tablet (50 mg total) by mouth at bedtime., Disp: 90 tablet, Rfl: 0 .  venlafaxine XR (EFFEXOR-XR) 75 MG 24 hr capsule, Take 1 capsule (75 mg total) by mouth daily with breakfast., Disp: 30 capsule, Rfl: 2 .  colchicine 0.6 MG tablet, Take 1 tablet (0.6 mg total) by mouth 2 (two) times daily as needed. (Patient not  taking: Reported on 01/01/2017), Disp: 60 tablet, Rfl: 0 .  LANTUS SOLOSTAR 100 UNIT/ML Solostar Pen, INJECT 20 UNITS SUBCUTANEOUSLY ONCE DAILY AT BEDTIME (Patient not taking: Reported on 02/20/2017), Disp: 15 mL, Rfl: 1 .  neomycin-polymyxin-hydrocortisone (CORTISPORIN) OTIC solution, Place 4 drops into the left ear 3 (three) times daily. (Patient not taking: Reported on 02/20/2017), Disp: 10 mL, Rfl: 0 .  predniSONE (DELTASONE) 5 MG tablet, Take 1 tablet by mouth daily., Disp: , Rfl: 1  Allergies  Allergen Reactions  . Augmentin [Amoxicillin-Pot Clavulanate]   .  Penicillins      Review of Systems  Constitutional: Negative for appetite change and fatigue.  Cardiovascular: Negative for chest pain.  Endo/Heme/Allergies: Positive for polydipsia.  Psychiatric/Behavioral: Positive for depression.     Objective  Vitals:   02/20/17 1100  BP: 112/60  Pulse: 82  Resp: 16  Temp: 98.5 F (36.9 C)  TempSrc: Oral  SpO2: 99%  Weight: 197 lb 12.8 oz (89.7 kg)    Physical Exam  Constitutional: She is oriented to person, place, and time and well-developed, well-nourished, and in no distress.  HENT:  Head: Normocephalic and atraumatic.  Cardiovascular: Normal rate, regular rhythm and normal heart sounds.  No murmur heard. Pulmonary/Chest: Effort normal and breath sounds normal. She has no wheezes.  Musculoskeletal: She exhibits edema (2+ pitting bilateral lower extremity ankle edema.).  Neurological: She is alert and oriented to person, place, and time.  Psychiatric: Mood, memory, affect and judgment normal.  Nursing note and vitals reviewed.      Assessment & Plan  1. Type 2 diabetes mellitus with peripheral neuropathy (HCC) A1c 7.1%, well-controlled diabetes, obtain urine microalbumin and creatinine ratio, recommended an eye exam - POCT HgB A1C - Urine Microalbumin w/creat. ratio  2. Recurrent major depressive disorder, in partial remission (HCC)  - venlafaxine XR (EFFEXOR-XR)  75 MG 24 hr capsule; Take 1 capsule (75 mg total) by mouth daily with breakfast.  Dispense: 90 capsule; Refill: 0  3. Pure hypercholesterolemia Will start on low-dose statin for primary prevention, obtain FLP - COMPLETE METABOLIC PANEL WITH GFR - Lipid panel - rosuvastatin (CRESTOR) 5 MG tablet; Take 1 tablet (5 mg total) by mouth daily.  Dispense: 90 tablet; Refill: 0  4. Infected blister of second toe of left foot, initial encounter Start on broad-spectrum antibiotic coverage, referral to wound care - AMB referral to wound care center - doxycycline (VIBRA-TABS) 100 MG tablet; Take 1 tablet (100 mg total) by mouth 2 (two) times daily for 7 days.  Dispense: 14 tablet; Refill: 0   Tricia [name] A. [facility] 02/20/2017 11:29 AM

## 2017-02-20 NOTE — Telephone Encounter (Signed)
Patient came in for her regular check up and pt Effexor was sent to [facility] and not her mail order. Canceled the Rx from [facility] and resend it to mail order

## 2017-02-21 LAB — COMPLETE METABOLIC PANEL WITH GFR
AG Ratio: 1.6 (calc) (ref 1.0–2.5)
ALBUMIN MSPROF: 4 g/dL (ref 3.6–5.1)
ALT: 21 U/L (ref 6–29)
AST: 22 U/L (ref 10–35)
Alkaline phosphatase (APISO): 73 U/L (ref 33–130)
BUN/Creatinine Ratio: 12 (calc) (ref 6–22)
BUN: 13 mg/dL (ref 7–25)
CO2: 26 mmol/L (ref 20–32)
CREATININE: 1.13 mg/dL — AB (ref 0.50–1.05)
Calcium: 9 mg/dL (ref 8.6–10.4)
Chloride: 106 mmol/L (ref 98–110)
GFR, EST AFRICAN AMERICAN: 63 mL/min/{1.73_m2} (ref >=60)
GFR, Est Non African American: 54 mL/min/{1.73_m2} — ABNORMAL LOW (ref >=60)
GLUCOSE: 193 mg/dL — AB (ref 65–139)
Globulin: 2.5 g/dL (calc) (ref 1.9–3.7)
POTASSIUM: 3.8 mmol/L (ref 3.5–5.3)
Sodium: 138 mmol/L (ref 135–146)
Total Bilirubin: 0.3 mg/dL (ref 0.2–1.2)
Total Protein: 6.5 g/dL (ref 6.1–8.1)

## 2017-02-21 LAB — MICROALBUMIN / CREATININE URINE RATIO
Creatinine, Urine: 49 mg/dL (ref 20–275)
MICROALB/CREAT RATIO: 41 ug/mg{creat} — AB (ref <30)
Microalb, Ur: 2 mg/dL

## 2017-02-21 LAB — LIPID PANEL
CHOL/HDL RATIO: 6.4 (calc) — AB (ref <5.0)
Cholesterol: 147 mg/dL (ref <200)
HDL: 23 mg/dL — AB (ref >50)
LDL Cholesterol (Calc): 87 mg/dL (calc)
NON-HDL CHOLESTEROL (CALC): 124 mg/dL (ref <130)
TRIGLYCERIDES: 283 mg/dL — AB (ref <150)

## 2017-02-25 ENCOUNTER — Other Ambulatory Visit: Payer: Self-pay

## 2017-02-25 DIAGNOSIS — Z1211 Encounter for screening for malignant neoplasm of colon: Secondary | ICD-10-CM

## 2017-02-25 DIAGNOSIS — T380X5A Adverse effect of glucocorticoids and synthetic analogues, initial encounter: Secondary | ICD-10-CM

## 2017-02-25 DIAGNOSIS — Z1212 Encounter for screening for malignant neoplasm of rectum: Principal | ICD-10-CM

## 2017-02-25 DIAGNOSIS — E099 Drug or chemical induced diabetes mellitus without complications: Secondary | ICD-10-CM | POA: Insufficient documentation

## 2017-02-25 NOTE — Progress Notes (Signed)
Gastroenterology Pre-Procedure Review  Request Date: 03/28/17 Requesting Physician: Dr. Allen Norris  PATIENT REVIEW QUESTIONS: The patient responded to the following health history questions as indicated:    1. Are you having any GI issues? no 2. Do you have a personal history of Polyps? yes (9 years prior) 3. Do you have a family history of Colon Cancer or Polyps? no 4. Diabetes Mellitus? yes (Type II) 5. Joint replacements in the past 12 months?no 6. Major health problems in the past 3 months?no 7. Any artificial heart valves, MVP, or defibrillator?no    MEDICATIONS & ALLERGIES:    Patient reports the following regarding taking any anticoagulation/antiplatelet therapy:   Plavix, Coumadin, Eliquis, Xarelto, Lovenox, Pradaxa, Brilinta, or Effient? no Aspirin? yes (81mg )  Patient confirms/reports the following medications:  Current Outpatient Medications  Medication Sig Dispense Refill  . albuterol (VENTOLIN HFA) 108 (90 BASE) MCG/ACT inhaler Inhale into the lungs.    . ALPRAZolam (XANAX) 1 MG tablet Take 1 tablet (1 mg total) by mouth 2 (two) times daily as needed for anxiety. (Patient taking differently: Take 0.5 mg by mouth 2 (two) times daily as needed for anxiety. ) 60 tablet 0  . baclofen (LIORESAL) 10 MG tablet Take 10 mg by mouth 3 (three) times daily.    . colchicine 0.6 MG tablet Take 1 tablet (0.6 mg total) by mouth 2 (two) times daily as needed. (Patient not taking: Reported on 01/01/2017) 60 tablet 0  . doxycycline (VIBRA-TABS) 100 MG tablet Take 1 tablet (100 mg total) by mouth 2 (two) times daily for 7 days. 14 tablet 0  . furosemide (LASIX) 40 MG tablet Take 1 tablet (40 mg total) by mouth daily as needed. 30 tablet 2  . gabapentin (NEURONTIN) 600 MG tablet Take 1 tablet by mouth 3 (three) times daily.    Marland Kitchen glucose blood test strip Use as directed to check blood glucose daily 100 each 2  . indomethacin (INDOCIN) 25 MG capsule Take 1 capsule (25 mg total) by mouth 2 (two) times  daily with a meal. 60 capsule 0  . ipratropium-albuterol (DUONEB) 0.5-2.5 (3) MG/3ML SOLN Inhale into the lungs.    Marland Kitchen LANTUS SOLOSTAR 100 UNIT/ML Solostar Pen INJECT 20 UNITS SUBCUTANEOUSLY ONCE DAILY AT BEDTIME (Patient not taking: Reported on 02/20/2017) 15 mL 1  . metFORMIN (GLUCOPHAGE) 1000 MG tablet TAKE 1 TABLET (1,000 MG TOTAL) BY MOUTH 2 (TWO) TIMES DAILY WITH A MEAL. 180 tablet 2  . montelukast (SINGULAIR) 10 MG tablet Take 10 mg by mouth at bedtime.     Marland Kitchen morphine (MS CONTIN) 15 MG 12 hr tablet Take 1 tablet by mouth 2 (two) times daily.    . naproxen (NAPROSYN) 500 MG tablet Take 1 tablet (500 mg total) by mouth 2 (two) times daily with a meal. 60 tablet 0  . neomycin-polymyxin-hydrocortisone (CORTISPORIN) OTIC solution Place 4 drops into the left ear 3 (three) times daily. (Patient not taking: Reported on 02/20/2017) 10 mL 0  . Oxycodone HCl 20 MG TABS Take 40 mg by mouth 2 (two) times daily.     . pantoprazole (PROTONIX) 40 MG tablet Take 40 mg by mouth daily as needed.    . predniSONE (DELTASONE) 5 MG tablet Take 1 tablet by mouth daily.  1  . promethazine (PHENERGAN) 25 MG tablet TAKE 1 TABLET BY MOUTH EVERY 8 HOURS AS NEEDED 90 tablet 0  . ranitidine (ZANTAC) 150 MG tablet Take 1 tablet by mouth 2 (two) times daily.    . rizatriptan (  MAXALT) 5 MG tablet Take 5 mg by mouth as needed.     . rosuvastatin (CRESTOR) 5 MG tablet Take 1 tablet (5 mg total) by mouth daily. 90 tablet 0  . theophylline (UNIPHYL) 400 MG 24 hr tablet   10  . tiotropium (SPIRIVA HANDIHALER) 18 MCG inhalation capsule Place into inhaler and inhale.    Marland Kitchen tiZANidine (ZANAFLEX) 4 MG tablet Take 1 tablet by mouth 3 (three) times daily.    Marland Kitchen topiramate (TOPAMAX) 100 MG tablet Take 1 tablet by mouth 2 (two) times daily.    . traZODone (DESYREL) 50 MG tablet Take 1 tablet (50 mg total) by mouth at bedtime. 90 tablet 0  . venlafaxine XR (EFFEXOR-XR) 75 MG 24 hr capsule Take 1 capsule (75 mg total) by mouth daily with  breakfast. 90 capsule 0   No current facility-administered medications for this visit.     Patient confirms/reports the following allergies:  Allergies  Allergen Reactions  . Augmentin [Amoxicillin-Pot Clavulanate]   . Penicillins     No orders of the defined types were placed in this encounter.   AUTHORIZATION INFORMATION Primary Insurance: 1D#: Group #:  Secondary Insurance: 1D#: Group #:  SCHEDULE INFORMATION: Date: 03/28/17 Time: Location: [place]

## 2017-03-01 ENCOUNTER — Ambulatory Visit: Payer: BLUE CROSS/BLUE SHIELD | Admitting: Physician Assistant

## 2017-03-03 NOTE — Progress Notes (Signed)
ELDENE, PLOCHER ([id number]) Visit Report for 03/01/2017 Arrival Information Details Patient Name: Tricia [name], Tricia [name]. Date of Service: 03/01/2017 10:30 AM Medical Record Number: [id number] Patient Account Number: [id number] Date of Birth/Sex: 1960/07/16 (57 y.o. Female) Treating RN: Carolyne Fiscal, Debi Primary Care Shakita Keir: Other Clinician: Referring Rada Zegers: Treating Katrenia Alkins/Extender: Melburn Hake, HOYT Weeks in Treatment: 0 Visit Information Patient Arrived: Wheel Chair Arrival Time: 09:48 Accompanied By: husband Transfer Assistance: EasyPivot Patient Lift Patient Identification Verified: Yes Secondary Verification Process Yes Completed: Patient Requires Transmission-Based No Precautions: Patient Has Alerts: No Electronic Signature(s) Signed: 03/01/2017 4:08:09 PM By: Alric Quan Entered By: Alric Quan on 03/01/2017 09:48:51

## 2017-03-05 ENCOUNTER — Ambulatory Visit: Payer: BLUE CROSS/BLUE SHIELD | Admitting: Cardiovascular Disease

## 2017-03-05 NOTE — Progress Notes (Deleted)
Cardiology Office Note   Date:  03/05/2017   ID:  [name], DOB 1960-07-19, MRN [id number]  PCP:  [name], MD  Cardiologist:  Kathlyn Sacramento, MD   No chief complaint on file.     History of Present Illness: KNIYAH KHUN is a 57 y.o. female who presents for *** This is a 57 year old female who was is here today for a follow-up visit regarding chronic diastolic heart failure. She has chronic medical conditions that include type 2 diabetes, hypertension, tobacco use and COPD. She also reports chronic tremors and bad balance which is being evaluated by neurology. She was seen recently for worsening leg edema that responded to furosemide. Currently she takes furosemide only as needed and an average of once to twice a week. During her initial evaluation in August Hx labs which showed normal BNP, TSH and basic metabolic profile. Echocardiogram was done in August which showed normal LV systolic function, mild to moderate mitral regurgitation, mildly dilated left atrium and mild pulmonary hypertension with an estimated systolic pulmonary pressure 41 mmHg. Nuclear stress test was done for exertional dyspnea which showed no evidence of ischemia. ABI was normal. Overall, she is stable from a cardiac standpoint with rare episodes of left-sided chest aching which is not exertional. leg edema improved.   Past Medical History:  Diagnosis Date  . Anxiety   . Asthma   . Cholesteatoma of left ear    x2  . Depression   . Diabetes mellitus without complication ([place])   . Diastolic dysfunction   . Hyperlipidemia   . Hypertension     Past Surgical History:  Procedure Laterality Date  . CARPAL TUNNEL RELEASE    . DILATION AND CURETTAGE OF UTERUS    . EXTERNAL EAR SURGERY    . TUBAL LIGATION       Current Outpatient Medications  Medication Sig Dispense Refill  . albuterol (VENTOLIN HFA) 108 (90 BASE) MCG/ACT inhaler Inhale into the lungs.    .  ALPRAZolam (XANAX) 1 MG tablet Take 1 tablet (1 mg total) by mouth 2 (two) times daily as needed for anxiety. (Patient taking differently: Take 0.5 mg by mouth 2 (two) times daily as needed for anxiety. ) 60 tablet 0  . baclofen (LIORESAL) 10 MG tablet Take 10 mg by mouth 3 (three) times daily.    . colchicine 0.6 MG tablet Take 1 tablet (0.6 mg total) by mouth 2 (two) times daily as needed. (Patient not taking: Reported on 01/01/2017) 60 tablet 0  . furosemide (LASIX) 40 MG tablet Take 1 tablet (40 mg total) by mouth daily as needed. 30 tablet 2  . gabapentin (NEURONTIN) 600 MG tablet Take 1 tablet by mouth 3 (three) times daily.    Marland Kitchen glucose blood test strip Use as directed to check blood glucose daily 100 each 2  . indomethacin (INDOCIN) 25 MG capsule Take 1 capsule (25 mg total) by mouth 2 (two) times daily with a meal. 60 capsule 0  . ipratropium-albuterol (DUONEB) 0.5-2.5 (3) MG/3ML SOLN Inhale into the lungs.    Marland Kitchen LANTUS SOLOSTAR 100 UNIT/ML Solostar Pen INJECT 20 UNITS SUBCUTANEOUSLY ONCE DAILY AT BEDTIME (Patient not taking: Reported on 02/20/2017) 15 mL 1  . metFORMIN (GLUCOPHAGE) 1000 MG tablet TAKE 1 TABLET (1,000 MG TOTAL) BY MOUTH 2 (TWO) TIMES DAILY WITH A MEAL. 180 tablet 2  . montelukast (SINGULAIR) 10 MG tablet Take 10 mg by mouth at bedtime.     Marland Kitchen  morphine (MS CONTIN) 15 MG 12 hr tablet Take 1 tablet by mouth 2 (two) times daily.    . naproxen (NAPROSYN) 500 MG tablet Take 1 tablet (500 mg total) by mouth 2 (two) times daily with a meal. 60 tablet 0  . neomycin-polymyxin-hydrocortisone (CORTISPORIN) OTIC solution Place 4 drops into the left ear 3 (three) times daily. (Patient not taking: Reported on 02/20/2017) 10 mL 0  . Oxycodone HCl 20 MG TABS Take 40 mg by mouth 2 (two) times daily.     . pantoprazole (PROTONIX) 40 MG tablet Take 40 mg by mouth daily as needed.    . predniSONE (DELTASONE) 5 MG tablet Take 1 tablet by mouth daily.  1  . promethazine (PHENERGAN) 25 MG tablet TAKE 1  TABLET BY MOUTH EVERY 8 HOURS AS NEEDED 90 tablet 0  . ranitidine (ZANTAC) 150 MG tablet Take 1 tablet by mouth 2 (two) times daily.    . rizatriptan (MAXALT) 5 MG tablet Take 5 mg by mouth as needed.     . rosuvastatin (CRESTOR) 5 MG tablet Take 1 tablet (5 mg total) by mouth daily. 90 tablet 0  . theophylline (UNIPHYL) 400 MG 24 hr tablet   10  . tiotropium (SPIRIVA HANDIHALER) 18 MCG inhalation capsule Place into inhaler and inhale.    Marland Kitchen tiZANidine (ZANAFLEX) 4 MG tablet Take 1 tablet by mouth 3 (three) times daily.    Marland Kitchen topiramate (TOPAMAX) 100 MG tablet Take 1 tablet by mouth 2 (two) times daily.    . traZODone (DESYREL) 50 MG tablet Take 1 tablet (50 mg total) by mouth at bedtime. 90 tablet 0  . venlafaxine XR (EFFEXOR-XR) 75 MG 24 hr capsule Take 1 capsule (75 mg total) by mouth daily with breakfast. 90 capsule 0   No current facility-administered medications for this visit.     Allergies:   Augmentin [amoxicillin-pot clavulanate] and Penicillins    Social History:  The patient  reports that she has been smoking cigarettes.  She has a 19.00 pack-year smoking history. she has never used smokeless tobacco. She reports that she does not drink alcohol or use drugs.   Family History:  [name] ***family history includes Cervical cancer in her daughter; Emphysema in her mother; Multiple sclerosis in her daughter; Stroke in her father; Throat cancer in her father.    ROS:  Please see the history of present illness.   Otherwise, review of systems are positive for {NONE DEFAULTED:18576::"none"}.   All other systems are reviewed and negative.    PHYSICAL EXAM: VS:  There were no vitals taken for this visit. , BMI There is no height or weight on file to calculate BMI. GEN: Well nourished, well developed, in no acute distress  HEENT: normal  Neck: no JVD, carotid bruits, or masses Cardiac: ***RRR; no murmurs, rubs, or gallops,no edema  Respiratory:  clear to auscultation bilaterally,  normal work of breathing GI: soft, nontender, nondistended, + BS MS: no deformity or atrophy  Skin: warm and dry, no rash Neuro:  Strength and sensation are intact Psych: euthymic mood, full affect   EKG:  EKG {ACTION; IS/IS OXB:[id number]} ordered today. The ekg ordered today demonstrates ***   Recent Labs: 02/20/2017: ALT 21; BUN 13; Creat 1.13; Potassium 3.8; Sodium 138    Lipid Panel    Component Value Date/Time   CHOL 147 02/20/2017 1155   CHOL 163 07/06/2015 0858   CHOL 144 10/09/2012 0452   TRIG 283 (H) 02/20/2017 1155   TRIG 172  10/09/2012 0452   HDL 23 (L) 02/20/2017 1155   HDL 31 (L) 07/06/2015 0858   HDL 25 (L) 10/09/2012 0452   CHOLHDL 6.4 (H) 02/20/2017 1155   VLDL 34 10/09/2012 0452   LDLCALC 107 (H) 07/06/2015 0858   LDLCALC 85 10/09/2012 0452      Wt Readings from Last 3 Encounters:  02/20/17 197 lb 12.8 oz (89.7 kg)  01/01/17 202 lb 1.6 oz (91.7 kg)  12/19/16 194 lb 11.2 oz (88.3 kg)      Other studies Reviewed: Additional studies/ records that were reviewed today include: ***. Review of the above records demonstrates: ***  No flowsheet data found.    ASSESSMENT AND PLAN:  1.  ***    Disposition:   FU with *** in {gen number 2-53:664403} {Days to years:10300}  Signed,  Kathlyn Sacramento, MD  03/05/2017 3:22 PM    [place]

## 2017-03-07 ENCOUNTER — Encounter: Payer: Self-pay | Admitting: Cardiovascular Disease

## 2017-03-12 DIAGNOSIS — G894 Chronic pain syndrome: Secondary | ICD-10-CM | POA: Diagnosis not present

## 2017-03-12 DIAGNOSIS — Z79891 Long term (current) use of opiate analgesic: Secondary | ICD-10-CM | POA: Diagnosis not present

## 2017-03-12 DIAGNOSIS — M545 Low back pain: Secondary | ICD-10-CM | POA: Diagnosis not present

## 2017-03-12 DIAGNOSIS — M5430 Sciatica, unspecified side: Secondary | ICD-10-CM | POA: Diagnosis not present

## 2017-03-12 DIAGNOSIS — F17200 Nicotine dependence, unspecified, uncomplicated: Secondary | ICD-10-CM | POA: Diagnosis not present

## 2017-03-12 DIAGNOSIS — Z5181 Encounter for therapeutic drug level monitoring: Secondary | ICD-10-CM | POA: Diagnosis not present

## 2017-03-12 DIAGNOSIS — Z720 Tobacco use: Secondary | ICD-10-CM | POA: Diagnosis not present

## 2017-03-12 DIAGNOSIS — Z716 Tobacco abuse counseling: Secondary | ICD-10-CM | POA: Diagnosis not present

## 2017-03-12 DIAGNOSIS — Z87891 Personal history of nicotine dependence: Secondary | ICD-10-CM | POA: Diagnosis not present

## 2017-03-14 ENCOUNTER — Other Ambulatory Visit: Payer: Self-pay | Admitting: Family Medicine

## 2017-03-14 ENCOUNTER — Other Ambulatory Visit: Payer: Self-pay

## 2017-03-14 ENCOUNTER — Telehealth: Payer: Self-pay | Admitting: Family Medicine

## 2017-03-14 DIAGNOSIS — G4700 Insomnia, unspecified: Secondary | ICD-10-CM

## 2017-03-14 DIAGNOSIS — R110 Nausea: Secondary | ICD-10-CM

## 2017-03-14 DIAGNOSIS — I10 Essential (primary) hypertension: Secondary | ICD-10-CM

## 2017-03-14 MED ORDER — PROMETHAZINE HCL 25 MG PO TABS: 25.0000 mg | ORAL_TABLET | Freq: Three times a day (TID) | ORAL | 0 refills | Status: DC | PRN

## 2017-03-14 MED ORDER — TRAZODONE HCL 50 MG PO TABS: 50.0000 mg | ORAL_TABLET | Freq: Every day | ORAL | 0 refills | Status: DC

## 2017-03-14 MED ORDER — LISINOPRIL 2.5 MG PO TABS: 2.5000 mg | ORAL_TABLET | Freq: Every day | ORAL | 0 refills | Status: DC

## 2017-03-14 NOTE — Telephone Encounter (Signed)
Spoke to the patient and explain the letter I sent to her and why she he wanted to her to start a lisinopril. Pt understood and asked the RX to be sent to [facility].

## 2017-03-14 NOTE — Telephone Encounter (Signed)
Copied from [place]. Topic: Quick Communication - See Telephone Encounter >> Mar 14, 2017  9:55 AM Conception Chancy, NT wrote: CRM for notification. See Telephone encounter for:  03/14/17.  Pt states she got a letter in the mail stating Dr. Manuella Ghazi wanted to start her on a new medication (she is unsure what the name of it is). She said it was in the letter she received about her blood work and believes it starts with (il). she would like a call back from the nurse explaining this medication. Patient is also needing a refill on promethazine 25mg  and trazodone 50mg .  [facility].

## 2017-03-14 NOTE — Telephone Encounter (Signed)
Trazodone to go to [place] and Phenergan to go to [facility]

## 2017-03-20 ENCOUNTER — Other Ambulatory Visit: Payer: Self-pay

## 2017-03-20 ENCOUNTER — Telehealth: Payer: Self-pay | Admitting: Gastroenterology

## 2017-03-20 NOTE — Telephone Encounter (Signed)
Please call patient as she has a problem drinking the bowel prep.

## 2017-03-21 ENCOUNTER — Telehealth: Payer: Self-pay | Admitting: Family Medicine

## 2017-03-21 NOTE — Telephone Encounter (Signed)
Patient states that due to past history with colonoscopy prep she will be unable to drink it. It makes her nauseous. Patient would like to use the Gatorade and Miralax prep with Dulcolax pills.   Advised patient of process, times, and amounts.

## 2017-03-21 NOTE — Telephone Encounter (Addendum)
Copied from [place]. Topic: Quick Communication - See Telephone Encounter >> Mar 21, 2017  5:57 PM Ivar Drape wrote: CRM for notification. See Telephone encounter for:  03/21/17. [place] [phone] needs a reply back about their request for the rosuvastatin (CRESTOR) 5 MG tablet refill.

## 2017-03-25 NOTE — Telephone Encounter (Signed)
Spoke Sherrette from [place] and stated to her that it was sent to her local pharmacy. Called [facility] to see if the patient picked it up and it has been confirmed that it was picked up.

## 2017-03-27 NOTE — Discharge Instructions (Signed)
General Anesthesia, Adult, Care After °These instructions provide you with information about caring for yourself after your procedure. Your health care provider may also give you more specific instructions. Your treatment has been planned according to current medical practices, but problems sometimes occur. Call your health care provider if you have any problems or questions after your procedure. °What can I expect after the procedure? °After the procedure, it is common to have: °· Vomiting. °· A sore throat. °· Mental slowness. ° °It is common to feel: °· Nauseous. °· Cold or shivery. °· Sleepy. °· Tired. °· Sore or achy, even in parts of your body where you did not have surgery. ° °Follow these instructions at home: °For at least 24 hours after the procedure: °· Do not: °? Participate in activities where you could fall or become injured. °? Drive. °? Use heavy machinery. °? Drink alcohol. °? Take sleeping pills or medicines that cause drowsiness. °? Make important decisions or sign legal documents. °? Take care of children on your own. °· Rest. °Eating and drinking °· If you vomit, drink water, juice, or soup when you can drink without vomiting. °· Drink enough fluid to keep your urine clear or pale yellow. °· Make sure you have little or no nausea before eating solid foods. °· Follow the diet recommended by your health care provider. °General instructions °· Have a responsible adult stay with you until you are awake and alert. °· Return to your normal activities as told by your health care provider. Ask your health care provider what activities are safe for you. °· Take over-the-counter and prescription medicines only as told by your health care provider. °· If you smoke, do not smoke without supervision. °· Keep all follow-up visits as told by your health care provider. This is important. °Contact a health care provider if: °· You continue to have nausea or vomiting at home, and medicines are not helpful. °· You  cannot drink fluids or start eating again. °· You cannot urinate after 8-12 hours. °· You develop a skin rash. °· You have fever. °· You have increasing redness at the site of your procedure. °Get help right away if: °· You have difficulty breathing. °· You have chest pain. °· You have unexpected bleeding. °· You feel that you are having a life-threatening or urgent problem. °This information is not intended to replace advice given to you by your health care provider. Make sure you discuss any questions you have with your health care provider. °Document Released: 05/14/2000 Document Revised: 07/11/2015 Document Reviewed: 01/20/2015 °Elsevier Interactive Patient Education © 2018 [employer] ° °

## 2017-04-22 ENCOUNTER — Other Ambulatory Visit: Payer: Self-pay | Admitting: General Practice

## 2017-04-22 DIAGNOSIS — R110 Nausea: Secondary | ICD-10-CM

## 2017-04-22 NOTE — Telephone Encounter (Signed)
Copied from [place]. Topic: Quick Communication - See Telephone Encounter >> Apr 22, 2017  8:34 AM Conception Chancy, NT wrote: CRM for notification. See Telephone encounter for:  04/22/17.  Patient is calling and requesting a refill on promethazine (PHENERGAN) 25 MG tablet. Patient is completely out and states her other medications make her nauseated. Please advise.   [place], [place] [address]  Phone: [phone] Fax: [phone]

## 2017-04-23 ENCOUNTER — Other Ambulatory Visit: Payer: Self-pay

## 2017-04-23 DIAGNOSIS — R110 Nausea: Secondary | ICD-10-CM

## 2017-04-23 NOTE — Telephone Encounter (Signed)
Refill request for general medication: Phenergan 25 mg  Last office visit: 02/20/2017  Last physical exam: None indicated  Follow-up on file. 04/24/2017

## 2017-04-24 ENCOUNTER — Ambulatory Visit: Payer: Medicare PPO | Admitting: Family Medicine

## 2017-04-25 ENCOUNTER — Ambulatory Visit
Admission: RE | Admit: 2017-04-25 | Discharge: 2017-04-25 | Disposition: A | Discharge status: Home / Self Care | Payer: Medicare PPO | Source: Ambulatory Visit | Attending: Gastroenterology | Admitting: Gastroenterology

## 2017-04-25 ENCOUNTER — Ambulatory Visit: Payer: Medicare PPO | Admitting: Anesthesiology

## 2017-04-25 ENCOUNTER — Encounter
Admission: RE | Disposition: A | Discharge status: Home / Self Care | Payer: Self-pay | Source: Ambulatory Visit | Attending: Gastroenterology

## 2017-04-25 DIAGNOSIS — I5030 Unspecified diastolic (congestive) heart failure: Secondary | ICD-10-CM | POA: Insufficient documentation

## 2017-04-25 DIAGNOSIS — F17210 Nicotine dependence, cigarettes, uncomplicated: Secondary | ICD-10-CM | POA: Insufficient documentation

## 2017-04-25 DIAGNOSIS — Z1212 Encounter for screening for malignant neoplasm of rectum: Secondary | ICD-10-CM | POA: Diagnosis not present

## 2017-04-25 DIAGNOSIS — E1140 Type 2 diabetes mellitus with diabetic neuropathy, unspecified: Secondary | ICD-10-CM | POA: Insufficient documentation

## 2017-04-25 DIAGNOSIS — J449 Chronic obstructive pulmonary disease, unspecified: Secondary | ICD-10-CM | POA: Insufficient documentation

## 2017-04-25 DIAGNOSIS — I110 Hypertensive heart disease with heart failure: Secondary | ICD-10-CM | POA: Diagnosis not present

## 2017-04-25 DIAGNOSIS — Z7984 Long term (current) use of oral hypoglycemic drugs: Secondary | ICD-10-CM | POA: Insufficient documentation

## 2017-04-25 DIAGNOSIS — F418 Other specified anxiety disorders: Secondary | ICD-10-CM | POA: Diagnosis not present

## 2017-04-25 DIAGNOSIS — K635 Polyp of colon: Secondary | ICD-10-CM | POA: Diagnosis not present

## 2017-04-25 DIAGNOSIS — E785 Hyperlipidemia, unspecified: Secondary | ICD-10-CM | POA: Diagnosis not present

## 2017-04-25 DIAGNOSIS — D125 Benign neoplasm of sigmoid colon: Secondary | ICD-10-CM

## 2017-04-25 DIAGNOSIS — Z1211 Encounter for screening for malignant neoplasm of colon: Secondary | ICD-10-CM

## 2017-04-25 DIAGNOSIS — Z79899 Other long term (current) drug therapy: Secondary | ICD-10-CM | POA: Insufficient documentation

## 2017-04-25 DIAGNOSIS — D124 Benign neoplasm of descending colon: Secondary | ICD-10-CM

## 2017-04-25 DIAGNOSIS — Z880 Allergy status to penicillin: Secondary | ICD-10-CM | POA: Insufficient documentation

## 2017-04-25 DIAGNOSIS — M1990 Unspecified osteoarthritis, unspecified site: Secondary | ICD-10-CM | POA: Insufficient documentation

## 2017-04-25 DIAGNOSIS — K219 Gastro-esophageal reflux disease without esophagitis: Secondary | ICD-10-CM | POA: Diagnosis not present

## 2017-04-25 HISTORY — PX: POLYPECTOMY: SHX149

## 2017-04-25 HISTORY — DX: Myoneural disorder, unspecified: G70.9

## 2017-04-25 HISTORY — DX: Headache, unspecified: R51.9

## 2017-04-25 HISTORY — DX: Unspecified osteoarthritis, unspecified site: M19.90

## 2017-04-25 HISTORY — DX: Essential tremor: G25.0

## 2017-04-25 HISTORY — DX: Cardiac arrhythmia, unspecified: I49.9

## 2017-04-25 HISTORY — PX: COLONOSCOPY WITH PROPOFOL: SHX5780

## 2017-04-25 HISTORY — DX: Gastro-esophageal reflux disease without esophagitis: K21.9

## 2017-04-25 HISTORY — DX: Cough: R05

## 2017-04-25 HISTORY — DX: Cough, unspecified: R05.9

## 2017-04-25 HISTORY — DX: Presence of dental prosthetic device (complete) (partial): Z97.2

## 2017-04-25 HISTORY — DX: Spasmodic torticollis: G24.3

## 2017-04-25 HISTORY — DX: Motion sickness, initial encounter: T75.3XXA

## 2017-04-25 HISTORY — DX: Headache: R51

## 2017-04-25 HISTORY — DX: Chronic obstructive pulmonary disease, unspecified: J44.9

## 2017-04-25 HISTORY — DX: Unspecified hearing loss, unspecified ear: H91.90

## 2017-04-25 HISTORY — DX: Dyspnea, unspecified: R06.00

## 2017-04-25 LAB — GLUCOSE, CAPILLARY
GLUCOSE-CAPILLARY: 187 mg/dL — AB (ref 65–99)
Glucose-Capillary: 150 mg/dL — ABNORMAL HIGH (ref 65–99)

## 2017-04-25 SURGERY — COLONOSCOPY WITH PROPOFOL
Anesthesia: General | Wound class: Contaminated

## 2017-04-25 MED ORDER — LACTATED RINGERS IV SOLN: 1000.0000 mL | INTRAVENOUS | Status: DC

## 2017-04-25 MED ORDER — PROPOFOL 10 MG/ML IV BOLUS
INTRAVENOUS | Status: DC | PRN
  Administered 2017-04-25: 20 mg via INTRAVENOUS
  Administered 2017-04-25: 10 mg via INTRAVENOUS
  Administered 2017-04-25 (×2): 20 mg via INTRAVENOUS
  Administered 2017-04-25: 60 mg via INTRAVENOUS
  Administered 2017-04-25 (×2): 20 mg via INTRAVENOUS
  Administered 2017-04-25: 10 mg via INTRAVENOUS
  Administered 2017-04-25: 20 mg via INTRAVENOUS

## 2017-04-25 MED ORDER — ACETAMINOPHEN 325 MG PO TABS: 650.0000 mg | ORAL_TABLET | Freq: Once | ORAL | Status: DC | PRN

## 2017-04-25 MED ORDER — ONDANSETRON HCL 4 MG/2ML IJ SOLN: 4.0000 mg | Freq: Once | INTRAMUSCULAR | Status: DC | PRN

## 2017-04-25 MED ORDER — ACETAMINOPHEN 160 MG/5ML PO SOLN: 325.0000 mg | ORAL | Status: DC | PRN

## 2017-04-25 MED ORDER — PROMETHAZINE HCL 25 MG PO TABS: 25.0000 mg | ORAL_TABLET | Freq: Four times a day (QID) | ORAL | 0 refills | Status: DC | PRN

## 2017-04-25 MED ORDER — SODIUM CHLORIDE 0.9 % IV SOLN: INTRAVENOUS | Status: DC

## 2017-04-25 MED ORDER — LACTATED RINGERS IV SOLN
INTRAVENOUS | Status: DC
  Administered 2017-04-25 (×2): via INTRAVENOUS

## 2017-04-25 MED ORDER — LIDOCAINE HCL (CARDIAC) 20 MG/ML IV SOLN
INTRAVENOUS | Status: DC | PRN
  Administered 2017-04-25: 30 mg via INTRAVENOUS

## 2017-04-25 SURGICAL SUPPLY — 24 items

## 2017-04-25 NOTE — Anesthesia Postprocedure Evaluation (Signed)
Anesthesia Post Note  [name]  Procedure(s) Performed: COLONOSCOPY WITH PROPOFOL (N/A ) POLYPECTOMY INTESTINAL  Patient location during evaluation: PACU Anesthesia Type: General Level of consciousness: awake and alert, oriented and patient cooperative Pain management: pain level controlled Vital Signs Assessment: post-procedure vital signs reviewed and stable Respiratory status: spontaneous breathing, nonlabored ventilation and respiratory function stable Cardiovascular status: blood pressure returned to baseline and stable Postop Assessment: adequate PO intake Anesthetic complications: no    Darrin Nipper

## 2017-04-25 NOTE — H&P (Signed)
Tricia Lame, MD [facility] [address] [name], [place] [address] Phone: [phone] Fax : [phone]  Primary Care Physician:  Tricia Nova, MD Primary Gastroenterologist:  Dr. Allen [name]  Pre-Procedure History & Physical: HPI:  Tricia [name] is a 57 y.o. female is here for a screening colonoscopy.   Past Medical History:  Diagnosis Date  . Anxiety   . Arthritis    joints and hands/ knees  . Asthma    uses inhaler  . Benign essential tremor    head  . Cervical dystonia    neck pain  . Cholesteatoma of left ear    x2  . COPD (chronic obstructive pulmonary disease) ([place])   . Cough   . Depression   . Diabetes mellitus without complication ([place])    type 2  . Diastolic dysfunction   . Dyspnea   . Dysrhythmia    diastolic dysfunction  . GERD (gastroesophageal reflux disease)   . Headache    migraines/ one per week  . 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 Hyperlipidemia   . Hypertension   . Motion sickness    boat  . Neuromuscular disorder (HCC)    neuropathy feet and hands( nerve damage)  . Wears dentures    upper and lower    Past Surgical History:  Procedure Laterality Date  . CARPAL TUNNEL RELEASE Bilateral    x2 right, 1x on left  . COLONOSCOPY    . DILATION AND CURETTAGE OF UTERUS    . EXTERNAL EAR SURGERY Left    x2  . SPINE SURGERY     herniated disc  . TUBAL LIGATION      Prior to Admission medications   Medication Sig Start Date End Date Taking? Authorizing Provider  albuterol (VENTOLIN HFA) 108 (90 BASE) MCG/ACT inhaler Inhale into the lungs as needed.  08/27/13  Yes [provider]  ALPRAZolam Duanne Moron) 1 MG tablet Take 1 tablet (1 mg total) by mouth 2 (two) times daily as needed for anxiety. Patient taking differently: Take 0.5 mg by mouth 3 (three) times daily as needed for anxiety.  07/04/15  Yes Tricia Nova, MD  calcium carbonate (TUMS EX) 750 MG chewable tablet Chew 2 tablets by mouth as needed  for heartburn.   Yes [provider]  cetirizine (ZYRTEC) 10 MG tablet Take 10 mg by mouth as needed for allergies.   Yes [provider]  fluticasone furoate-vilanterol (BREO ELLIPTA) 100-25 MCG/INH AEPB Inhale 1 puff into the lungs 2 (two) times daily.   Yes [provider]  furosemide (LASIX) 40 MG tablet Take 1 tablet (40 mg total) by mouth daily as needed. 12/19/16  Yes Tricia Nova, MD  gabapentin (NEURONTIN) 600 MG tablet Take 1 tablet by mouth 3 (three) times daily.   Yes [provider]  indomethacin (INDOCIN) 25 MG capsule Take 1 capsule (25 mg total) by mouth 2 (two) times daily with a meal. Patient taking differently: Take 25 mg by mouth 3 (three) times daily as needed.  12/19/16  Yes Keith Rake Asad A, MD  ipratropium-albuterol (DUONEB) 0.5-2.5 (3) MG/3ML SOLN Inhale into the lungs. 08/27/13  Yes [provider]  lisinopril (PRINIVIL,ZESTRIL) 2.5 MG tablet Take 1 tablet (2.5 mg total) by mouth daily. Patient taking differently: Take 2.5 mg by mouth daily. pm 03/14/17  Yes Keith Rake Asad A, MD  metFORMIN (GLUCOPHAGE) 1000 MG tablet TAKE 1 TABLET (1,000 MG  TOTAL) BY MOUTH 2 (TWO) TIMES DAILY WITH A MEAL. Patient taking differently: Take 1,000 mg by mouth daily with breakfast.  12/18/16  Yes Keith Rake Asad A, MD  montelukast (SINGULAIR) 10 MG tablet Take 10 mg by mouth daily. afternoon 01/28/13  Yes [provider]  morphine (MS CONTIN) 15 MG 12 hr tablet Take 1 tablet by mouth 2 (two) times daily. 11/20/16  Yes [provider]  naproxen (NAPROSYN) 500 MG tablet Take 1 tablet (500 mg total) by mouth 2 (two) times daily with a meal. Patient taking differently: Take 500 mg by mouth 2 (two) times daily as needed.  11/23/14  Yes Lorin Picket, PA-C  oxyCODONE (ROXICODONE) 15 MG immediate release tablet Take 15 mg by mouth every 6 (six) hours as needed for pain.   Yes [provider]  pantoprazole (PROTONIX) 40 MG  tablet Take 40 mg by mouth daily as needed.   Yes [provider]  ranitidine (ZANTAC) 150 MG tablet Take 1 tablet by mouth 2 (two) times daily as needed.    Yes [provider]  rizatriptan (MAXALT) 5 MG tablet Take 5 mg by mouth as needed.  01/22/14  Yes [provider]  rosuvastatin (CRESTOR) 5 MG tablet Take 1 tablet (5 mg total) by mouth daily. Patient taking differently: Take 5 mg by mouth at bedtime.  02/20/17  Yes Tricia Nova, MD  theophylline (UNIPHYL) 400 MG 24 hr tablet daily.  04/11/15  Yes [provider]  tiotropium (SPIRIVA HANDIHALER) 18 MCG inhalation capsule Place into inhaler and inhale daily. pm 08/27/13  Yes [provider]  tiZANidine (ZANAFLEX) 4 MG tablet Take 1 tablet by mouth 4 (four) times daily.  12/15/16  Yes [provider]  topiramate (TOPAMAX) 100 MG tablet Take 1 tablet by mouth 2 (two) times daily. 10/12/16  Yes [provider]  traZODone (DESYREL) 50 MG tablet Take 1 tablet (50 mg total) by mouth at bedtime. 03/14/17  Yes Tricia Nova, MD  venlafaxine XR (EFFEXOR-XR) 75 MG 24 hr capsule Take 1 capsule (75 mg total) by mouth daily with breakfast. 02/20/17 05/21/17 Yes Rochel Brome A, MD  colchicine 0.6 MG tablet Take 1 tablet (0.6 mg total) by mouth 2 (two) times daily as needed. Patient not taking: Reported on 01/01/2017 12/19/16 01/18/17  Keith Rake Asad A, MD  glucose blood test strip Use as directed to check blood glucose daily 10/11/16   Tricia Nova, MD  Oxycodone HCl 20 MG TABS Take 40 mg by mouth 2 (two) times daily.     [provider]  promethazine (PHENERGAN) 25 MG tablet Take 1 tablet (25 mg total) by mouth every 8 (eight) hours as needed. 03/14/17   Tricia Nova, MD    Allergies as of 02/25/2017 - Review Complete 02/20/2017  Allergen Reaction Noted  . Augmentin [amoxicillin-pot clavulanate]  11/12/2012  . Penicillins  08/16/2014    Family History  Problem Relation  Age of Onset  . Emphysema Mother   . Stroke Father   . Throat cancer Father   . Multiple sclerosis Daughter   . Cervical cancer Daughter     Social History   Socioeconomic History  . Marital status: Divorced    Spouse name: Not on file  . Number of children: 2  . Years of education: Not on file  . Highest education level: Not on file  Social Needs  . Financial resource strain: Not very hard  .  Food insecurity - worry: Never true  . Food insecurity - inability: Never true  . Transportation needs - medical: No  . Transportation needs - non-medical: No  Occupational History  . Occupation: Disability  Tobacco Use  . Smoking status: Current Every Day Smoker    Packs/day: 1.00    Years: 38.00    Pack years: 38.00    Types: Cigarettes  . Smokeless tobacco: Never Used  Substance and Sexual Activity  . Alcohol use: No    Alcohol/week: 0.0 oz  . Drug use: No  . Sexual activity: No  Other Topics Concern  . Not on file  Social History Narrative  . Not on file    Review of Systems: See HPI, otherwise negative ROS  Physical Exam: Ht 5\' 9"  (1.753 m)   Wt 197 lb (89.4 kg)   BMI 29.09 kg/m  General:   Alert,  pleasant and cooperative in NAD Head:  Normocephalic and atraumatic. Neck:  Supple; no masses or thyromegaly. Lungs:  Clear throughout to auscultation.    Heart:  Regular rate and rhythm. Abdomen:  Soft, nontender and nondistended. Normal bowel sounds, without guarding, and without rebound.   Neurologic:  Alert and  oriented x4;  grossly normal neurologically.  Impression/Plan: CHARMA MOCARSKI is now here to undergo a screening colonoscopy.  Risks, benefits, and alternatives regarding colonoscopy have been reviewed with the patient.  Questions have been answered.  All parties agreeable.

## 2017-04-25 NOTE — Anesthesia Preprocedure Evaluation (Signed)
Anesthesia Evaluation  Patient identified by MRN, date of birth, ID band Patient awake    Reviewed: Allergy & Precautions, NPO status , Patient's Chart, lab work & pertinent test results  History of Anesthesia Complications Negative for: history of anesthetic complications  Airway Mallampati: IV  TM Distance: >3 FB Neck ROM: Full    Dental  (+) Upper Dentures, Lower Dentures   Pulmonary asthma , COPD, Current Smoker (1 ppd),    Pulmonary exam normal breath sounds clear to auscultation       Cardiovascular Exercise Tolerance: Good hypertension, Normal cardiovascular exam+ dysrhythmias  Rhythm:Regular Rate:Normal     Neuro/Psych PSYCHIATRIC DISORDERS Anxiety Depression HOH, hx cholesteatoma  Neuromuscular disease (neuropathy in hands/feet)    GI/Hepatic GERD  ,  Endo/Other  diabetes, Type 2  Renal/GU      Musculoskeletal   Abdominal   Peds  Hematology negative hematology ROS (+)   Anesthesia Other Findings   Reproductive/Obstetrics                             Anesthesia Physical Anesthesia Plan  ASA: III  Anesthesia Plan: General   Post-op Pain Management:    Induction: Intravenous  PONV Risk Score and Plan: 2 and Propofol infusion and TIVA  Airway Management Planned: Natural Airway  Additional Equipment:   Intra-op Plan:   Post-operative Plan:   Informed Consent: I have reviewed the patients History and Physical, chart, labs and discussed the procedure including the risks, benefits and alternatives for the proposed anesthesia with the patient or authorized representative who has indicated his/her understanding and acceptance.     Plan Discussed with: CRNA  Anesthesia Plan Comments:         Anesthesia Quick Evaluation

## 2017-04-25 NOTE — Op Note (Signed)
[facility] Patient Name: Tricia [name] Procedure Date: 04/25/2017 7:30 AM MRN: [id number] Account #: [id number] Date of Birth: Jul 08, 1960 Admit Type: Outpatient Age: 57 Room: [facility] OR ROOM 01 Gender: Female Note Status: Finalized Procedure:            Colonoscopy Indications:          Screening for colorectal malignant neoplasm Providers:            [name] MD, MD Referring MD:         Otila Back. Manuella Ghazi (Referring MD) Medicines:            Propofol per Anesthesia Complications:        No immediate complications. Procedure:            Pre-Anesthesia Assessment:                       - Prior to the procedure, a History and Physical was                        performed, and patient medications and allergies were                        reviewed. [name] tolerance of previous                        anesthesia was also reviewed. The risks and benefits of                        the procedure and the sedation options and risks were                        discussed with the patient. All questions were                        answered, and informed consent was obtained. Prior                        Anticoagulants: The patient has taken no previous                        anticoagulant or antiplatelet agents. ASA Grade                        Assessment: II - A patient with mild systemic disease.                        After reviewing the risks and benefits, the patient was                        deemed in satisfactory condition to undergo the                        procedure.                       After obtaining informed consent, the colonoscope was                        passed under direct vision. Throughout the procedure,  the patient's blood pressure, pulse, and oxygen                        saturations were monitored continuously. The Olympus                        CF-HQ190L Colonoscope (S#. [id number]) was introduced                    through the anus and advanced to the the cecum,                        identified by appendiceal orifice and ileocecal valve.                        The colonoscopy was performed without difficulty. The                        patient tolerated the procedure well. The quality of                        the bowel preparation was poor. Findings:      The perianal and digital rectal examinations were normal.      A 5 mm polyp was found in the descending colon. The polyp was sessile.       The polyp was removed with a cold biopsy forceps. Resection and       retrieval were complete.      Two sessile polyps were found in the sigmoid colon. The polyps were 2 to       3 mm in size. These polyps were removed with a cold biopsy forceps.       Resection and retrieval were complete.      Semi-liquid stool was found in the entire colon. Impression:           - Preparation of the colon was poor.                       - One 5 mm polyp in the descending colon, removed with                        a cold biopsy forceps. Resected and retrieved.                       - Two 2 to 3 mm polyps in the sigmoid colon, removed                        with a cold biopsy forceps. Resected and retrieved.                       - Stool in the entire examined colon. Recommendation:       - Discharge patient to home.                       - Resume previous diet.                       - Continue present medications.                       - Await pathology results.                       -  Repeat colonoscopy in 5 years if polyp adenoma and 10                        years if hyperplastic Procedure Code(s):    --- Professional ---                       [phone], Colonoscopy, flexible; with biopsy, single or                        multiple Diagnosis Code(s):    --- Professional ---                       Z12.11, Encounter for screening for malignant neoplasm                        of colon                        D12.4, Benign neoplasm of descending colon                       D12.5, Benign neoplasm of sigmoid colon CPT copyright 2016 [employer]. All rights reserved. The codes documented in this report are preliminary and upon coder review may  be revised to meet current compliance requirements. [name] MD, MD 04/25/2017 8:23:27 AM This report has been signed electronically. Number of Addenda: 0 Note Initiated On: 04/25/2017 7:30 AM Scope Withdrawal Time: 0 hours 9 minutes 19 seconds  Total Procedure Duration: 0 hours 21 minutes 38 seconds       [facility]

## 2017-04-25 NOTE — Transfer of Care (Signed)
Immediate Anesthesia Transfer of Care Note  [name]  Procedure(s) Performed: COLONOSCOPY WITH PROPOFOL (N/A ) POLYPECTOMY INTESTINAL  Patient Location: PACU  Anesthesia Type: General  Level of Consciousness: awake, alert  and patient cooperative  Airway and Oxygen Therapy: Patient Spontanous Breathing and Patient connected to supplemental oxygen  Post-op Assessment: Post-op Vital signs reviewed, Patient's Cardiovascular Status Stable, Respiratory Function Stable, Patent Airway and No signs of Nausea or vomiting  Post-op Vital Signs: Reviewed and stable  Complications: No apparent anesthesia complications

## 2017-04-25 NOTE — Anesthesia Procedure Notes (Signed)
Procedure Name: MAC Performed by: Rechelle Niebla, CRNA Pre-anesthesia Checklist: Patient identified, Emergency Drugs available, Suction available, Patient being monitored and Timeout performed Patient Re-evaluated:Patient Re-evaluated prior to induction Oxygen Delivery Method: Nasal cannula       

## 2017-04-29 ENCOUNTER — Other Ambulatory Visit: Payer: Self-pay

## 2017-04-29 ENCOUNTER — Encounter: Payer: Self-pay | Admitting: Gastroenterology

## 2017-04-29 DIAGNOSIS — M1A4710 Other secondary chronic gout, right ankle and foot, without tophus (tophi): Secondary | ICD-10-CM

## 2017-04-29 MED ORDER — INDOMETHACIN 25 MG PO CAPS: 25.0000 mg | ORAL_CAPSULE | Freq: Two times a day (BID) | ORAL | 0 refills | Status: DC

## 2017-04-29 NOTE — Telephone Encounter (Signed)
Refill request for general medication. Indomethacin to [facility].   Last office visit: 02/20/2017   Follow up on 01/14/2018

## 2017-05-21 ENCOUNTER — Other Ambulatory Visit: Payer: Self-pay | Admitting: Family Medicine

## 2017-05-21 NOTE — Telephone Encounter (Signed)
Refill request for general medication: Phenergan 25 mg  Last office visit: 02/20/2017  Last physical exam: None indicated  Follow-ups on file. 01/14/2018

## 2017-05-21 NOTE — Telephone Encounter (Signed)
Copied from [place] [id number]. Topic: Quick Communication - See Telephone Encounter >> May 21, 2017 11:50 AM Hewitt Shorts wrote: CRM for notification. See Telephone encounter for: 05/21/17.pt is needing a refill on phenergan  [facility] in [place] number [phone]

## 2017-05-22 DIAGNOSIS — J449 Chronic obstructive pulmonary disease, unspecified: Secondary | ICD-10-CM | POA: Diagnosis not present

## 2017-06-07 ENCOUNTER — Other Ambulatory Visit: Payer: Self-pay | Admitting: Nurse Practitioner

## 2017-06-07 DIAGNOSIS — F3341 Major depressive disorder, recurrent, in partial remission: Secondary | ICD-10-CM

## 2017-06-07 MED ORDER — VENLAFAXINE HCL ER 75 MG PO CP24: 75.0000 mg | ORAL_CAPSULE | Freq: Every day | ORAL | 0 refills | Status: DC

## 2017-06-11 ENCOUNTER — Ambulatory Visit: Payer: Medicare PPO | Admitting: Nurse Practitioner

## 2017-06-21 ENCOUNTER — Ambulatory Visit: Payer: Medicare PPO | Admitting: Nurse Practitioner

## 2017-06-21 ENCOUNTER — Encounter: Payer: Self-pay | Admitting: Nurse Practitioner

## 2017-06-21 VITALS — BP 110/62 | HR 114 | Temp 98.4°F | Resp 16 | Ht 69.0 in | Wt 190.0 lb

## 2017-06-21 DIAGNOSIS — R600 Localized edema: Secondary | ICD-10-CM

## 2017-06-21 DIAGNOSIS — Z720 Tobacco use: Secondary | ICD-10-CM | POA: Diagnosis not present

## 2017-06-21 DIAGNOSIS — J411 Mucopurulent chronic bronchitis: Secondary | ICD-10-CM

## 2017-06-21 DIAGNOSIS — E119 Type 2 diabetes mellitus without complications: Secondary | ICD-10-CM

## 2017-06-21 DIAGNOSIS — I10 Essential (primary) hypertension: Secondary | ICD-10-CM | POA: Diagnosis not present

## 2017-06-21 DIAGNOSIS — N183 Chronic kidney disease, stage 3 (moderate): Secondary | ICD-10-CM | POA: Diagnosis not present

## 2017-06-21 DIAGNOSIS — F3341 Major depressive disorder, recurrent, in partial remission: Secondary | ICD-10-CM | POA: Diagnosis not present

## 2017-06-21 DIAGNOSIS — J441 Chronic obstructive pulmonary disease with (acute) exacerbation: Secondary | ICD-10-CM

## 2017-06-21 DIAGNOSIS — E7800 Pure hypercholesterolemia, unspecified: Secondary | ICD-10-CM | POA: Diagnosis not present

## 2017-06-21 DIAGNOSIS — R110 Nausea: Secondary | ICD-10-CM | POA: Diagnosis not present

## 2017-06-21 DIAGNOSIS — Z794 Long term (current) use of insulin: Secondary | ICD-10-CM

## 2017-06-21 DIAGNOSIS — N1831 Chronic kidney disease, stage 3a: Secondary | ICD-10-CM

## 2017-06-21 MED ORDER — ALBUTEROL SULFATE HFA 108 (90 BASE) MCG/ACT IN AERS
1.0000 | INHALATION_SPRAY | RESPIRATORY_TRACT | 3 refills | Status: DC | PRN
Start: 2017-06-21 — End: 2017-10-15

## 2017-06-21 MED ORDER — GABAPENTIN 600 MG PO TABS: 600.0000 mg | ORAL_TABLET | Freq: Three times a day (TID) | ORAL | 3 refills | Status: DC

## 2017-06-21 MED ORDER — PREDNISONE 10 MG (21) PO TBPK: ORAL_TABLET | ORAL | 0 refills | Status: DC

## 2017-06-21 MED ORDER — DOXYCYCLINE HYCLATE 100 MG PO TABS
100.0000 mg | ORAL_TABLET | Freq: Two times a day (BID) | ORAL | 0 refills | Status: DC
Start: 2017-06-21 — End: 2017-10-29

## 2017-06-21 MED ORDER — MONTELUKAST SODIUM 10 MG PO TABS: 10.0000 mg | ORAL_TABLET | Freq: Every day | ORAL | 0 refills | Status: DC

## 2017-06-21 MED ORDER — METFORMIN HCL 1000 MG PO TABS: 1000.0000 mg | ORAL_TABLET | Freq: Every day | ORAL | 0 refills | Status: DC

## 2017-06-21 MED ORDER — VENLAFAXINE HCL ER 75 MG PO CP24: 75.0000 mg | ORAL_CAPSULE | Freq: Every day | ORAL | 0 refills | Status: DC

## 2017-06-21 MED ORDER — LISINOPRIL 2.5 MG PO TABS: 2.5000 mg | ORAL_TABLET | Freq: Every day | ORAL | 0 refills | Status: DC

## 2017-06-21 MED ORDER — PROMETHAZINE HCL 25 MG PO TABS: 25.0000 mg | ORAL_TABLET | Freq: Three times a day (TID) | ORAL | 0 refills | Status: DC | PRN

## 2017-06-21 MED ORDER — FUROSEMIDE 40 MG PO TABS: 40.0000 mg | ORAL_TABLET | Freq: Every day | ORAL | 2 refills | Status: DC | PRN

## 2017-06-21 MED ORDER — GLUCOSE BLOOD VI STRP: ORAL_STRIP | 2 refills | Status: DC

## 2017-06-21 MED ORDER — FLUTICASONE FUROATE-VILANTEROL 100-25 MCG/INH IN AEPB
1.0000 | INHALATION_SPRAY | Freq: Two times a day (BID) | RESPIRATORY_TRACT | 6 refills | Status: DC
Start: 2017-06-21 — End: 2017-10-29

## 2017-06-21 MED ORDER — IPRATROPIUM-ALBUTEROL 0.5-2.5 (3) MG/3ML IN SOLN: 3.0000 mL | Freq: Four times a day (QID) | RESPIRATORY_TRACT | 3 refills | Status: AC | PRN

## 2017-06-21 MED ORDER — VARENICLINE TARTRATE 0.5 MG X 11 & 1 MG X 42 PO MISC: ORAL | 0 refills | Status: DC

## 2017-06-21 MED ORDER — ROSUVASTATIN CALCIUM 5 MG PO TABS: 5.0000 mg | ORAL_TABLET | Freq: Every day | ORAL | 0 refills | Status: DC

## 2017-06-21 NOTE — Progress Notes (Signed)
Name: Tricia [name]   MRN: [id number]    DOB: 1960-07-25   Date:06/21/2017       Progress Note  Subjective  Chief Complaint  Chief Complaint  Patient presents with  . Medication Refill    all meds through Dr. Manuella Ghazi  . Tick Removal    several bites and complains of itching    HPI  Diabetes Mellitus Patient is rx metformin 1090m daily. Takes medications as prescribed with 3-423msed doses a month.  Diet: states eats when she is hungry, eats lucky charms in the morning, sandwich and cheeseburgers, meatloaf porkchops, mac and cheese. Drinks- coffee, tea and sprite, koolaid  Checks blood sugars 1-2 times a day. Ranging from 105 with an average of 148  Denies polyphagia, polydipsia, polyuria.   MDD Patient takes effecor 75 once daily, feels it is working fine. Mood is well controlled. Denies SI.   Tobacco Use Patient smokes less than a pack a day. States is willing to quit.   Tick bites Patient noted multiple tick bites, stomach, two on the left leg. No rashes, endorses itching. No changes in fevers, headache, fatigue, and muscle aches.  Patient Active Problem List   Diagnosis Date Noted  . Screening for colorectal cancer   . Benign neoplasm of descending colon   . Polyp of sigmoid colon   . Steroid-induced diabetes ([facility]01/08/2017  . Drainage from left ear 10/12/2016  . Depression 06/19/2016  . Anxiety 06/19/2016  . Urinary tract infection 01/24/2015  . Polyneuropathy 10/21/2014  . Chronic venous insufficiency 10/05/2014  . Bilateral leg edema 08/16/2014  . Major depression in partial remission ([facility]06/27/2016  . Acid reflux 08/16/2014  . 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 Anxiety and depression 08/16/2014  . Airway hyperreactivity 08/16/2014  . Asthma, moderate persistent 08/16/2014  . Edema leg 08/16/2014  . Carpal tunnel syndrome 08/16/2014  . Cervical pain 08/16/2014  . CAFL (chronic airflow limitation) ([facility]06/27/2016  . Type 2 diabetes  mellitus with peripheral neuropathy ([facility]06/27/2016  . Diabetes mellitus type 2, insulin dependent ([facility]06/27/2016  . Dyslipidemia 08/16/2014  . Current smoker 08/16/2014  . Essential (primary) hypertension 08/16/2014  . Candida esophagitis ([facility]CH[facility]06/27/2016  . Benign neoplasm of stomach 08/16/2014  . Gout 08/16/2014  . HLD (hyperlipidemia) 08/16/2014  . Low back pain 08/16/2014  . Lumbar radiculopathy 08/16/2014  . Headache, migraine 08/16/2014  . Nausea without vomiting 08/16/2014  . Arthralgia of multiple joints 08/16/2014  . Mechanical and motor problems with internal organs 08/16/2014  . Has a tremor 08/16/2014  . Avitaminosis D 08/16/2014  . Primary osteoarthritis of both knees 06/22/2014  . Benign essential tremor 10/02/2013  . Cervical dystonia 10/02/2013  . Chronic diastolic heart failure (HCM[facility]09/28/2014  . Chronic pain 11/13/2012    Past Medical History:  Diagnosis Date  . Anxiety   . Arthritis    joints and hands/ knees  . Asthma    uses inhaler  . Benign essential tremor    head  . Cervical dystonia    neck pain  . Cholesteatoma of left ear    x2  . COPD (chronic obstructive pulmonary disease) ([facility]  . Cough   . Depression   . Diabetes mellitus without complication (HCL[facility]   type 2  . Diastolic dysfunction   . Dyspnea   . Dysrhythmia    diastolic dysfunction  . GERD (gastroesophageal reflux disease)   . Headache    migraines/ one per week  . HOH (  hard of hearing)    partially deaf left ear  . Hyperlipidemia   . Hypertension   . Motion sickness    boat  . Neuromuscular disorder (HCC)    neuropathy feet and hands( nerve damage)  . Wears dentures    upper and lower    Past Surgical History:  Procedure Laterality Date  . CARPAL TUNNEL RELEASE Bilateral    x2 right, 1x on left  . COLONOSCOPY    . COLONOSCOPY WITH PROPOFOL N/A 04/25/2017   Procedure: COLONOSCOPY WITH PROPOFOL;  Surgeon: [name], MD;  Location: [facility];   Service: Endoscopy;  Laterality: N/A;  diabetic-oral med  . DILATION AND CURETTAGE OF UTERUS    . EXTERNAL EAR SURGERY Left    x2  . POLYPECTOMY  04/25/2017   Procedure: POLYPECTOMY INTESTINAL;  Surgeon: [name], MD;  Location: [facility];  Service: Endoscopy;;  . SPINE SURGERY     herniated disc  . TUBAL LIGATION      Social History   Tobacco Use  . Smoking status: Current Every Day Smoker    Packs/day: 1.00    Years: 38.00    Pack years: 38.00    Types: Cigarettes  . Smokeless tobacco: Never Used  Substance Use Topics  . Alcohol use: No    Alcohol/week: 0.0 oz     Current Outpatient Medications:  .  albuterol (VENTOLIN HFA) 108 (90 BASE) MCG/ACT inhaler, Inhale into the lungs as needed. , Disp: , Rfl:  .  ALPRAZolam (XANAX) 1 MG tablet, Take 1 tablet (1 mg total) by mouth 2 (two) times daily as needed for anxiety. (Patient taking differently: Take 0.5 mg by mouth 3 (three) times daily as needed for anxiety. ), Disp: 60 tablet, Rfl: 0 .  calcium carbonate (TUMS EX) 750 MG chewable tablet, Chew 2 tablets by mouth as needed for heartburn., Disp: , Rfl:  .  cetirizine (ZYRTEC) 10 MG tablet, Take 10 mg by mouth as needed for allergies., Disp: , Rfl:  .  fluticasone furoate-vilanterol (BREO ELLIPTA) 100-25 MCG/INH AEPB, Inhale 1 puff into the lungs 2 (two) times daily., Disp: , Rfl:  .  furosemide (LASIX) 40 MG tablet, Take 1 tablet (40 mg total) by mouth daily as needed., Disp: 30 tablet, Rfl: 2 .  gabapentin (NEURONTIN) 600 MG tablet, Take 1 tablet by mouth 3 (three) times daily., Disp: , Rfl:  .  indomethacin (INDOCIN) 25 MG capsule, Take 1 capsule (25 mg total) by mouth 2 (two) times daily with a meal., Disp: 60 capsule, Rfl: 0 .  ipratropium-albuterol (DUONEB) 0.5-2.5 (3) MG/3ML SOLN, Inhale into the lungs., Disp: , Rfl:  .  lisinopril (PRINIVIL,ZESTRIL) 2.5 MG tablet, Take 1 tablet (2.5 mg total) by mouth daily. (Patient taking differently: Take 2.5 mg by mouth  daily. pm), Disp: 90 tablet, Rfl: 0 .  metFORMIN (GLUCOPHAGE) 1000 MG tablet, TAKE 1 TABLET (1,000 MG TOTAL) BY MOUTH 2 (TWO) TIMES DAILY WITH A MEAL. (Patient taking differently: Take 1,000 mg by mouth daily with breakfast. ), Disp: 180 tablet, Rfl: 2 .  montelukast (SINGULAIR) 10 MG tablet, Take 10 mg by mouth daily. afternoon, Disp: , Rfl:  .  morphine (MS CONTIN) 15 MG 12 hr tablet, Take 1 tablet by mouth 2 (two) times daily., Disp: , Rfl:  .  naproxen (NAPROSYN) 500 MG tablet, Take 1 tablet (500 mg total) by mouth 2 (two) times daily with a meal. (Patient taking differently: Take 500 mg by mouth 2 (two) times  daily as needed. ), Disp: 60 tablet, Rfl: 0 .  oxyCODONE (ROXICODONE) 15 MG immediate release tablet, Take 15 mg by mouth every 6 (six) hours as needed for pain., Disp: , Rfl:  .  promethazine (PHENERGAN) 25 MG tablet, Take 1 tablet (25 mg total) by mouth every 8 (eight) hours as needed., Disp: 90 tablet, Rfl: 0 .  ranitidine (ZANTAC) 150 MG tablet, Take 1 tablet by mouth 2 (two) times daily as needed. , Disp: , Rfl:  .  rizatriptan (MAXALT) 5 MG tablet, Take 5 mg by mouth as needed. , Disp: , Rfl:  .  rosuvastatin (CRESTOR) 5 MG tablet, Take 1 tablet (5 mg total) by mouth daily. (Patient taking differently: Take 5 mg by mouth at bedtime. ), Disp: 90 tablet, Rfl: 0 .  theophylline (UNIPHYL) 400 MG 24 hr tablet, daily. , Disp: , Rfl: 10 .  tiotropium (SPIRIVA HANDIHALER) 18 MCG inhalation capsule, Place into inhaler and inhale daily. pm, Disp: , Rfl:  .  tiZANidine (ZANAFLEX) 4 MG tablet, Take 1 tablet by mouth 4 (four) times daily. , Disp: , Rfl:  .  topiramate (TOPAMAX) 100 MG tablet, Take 1 tablet by mouth 2 (two) times daily., Disp: , Rfl:  .  traZODone (DESYREL) 50 MG tablet, Take 1 tablet (50 mg total) by mouth at bedtime., Disp: 90 tablet, Rfl: 0 .  venlafaxine XR (EFFEXOR-XR) 75 MG 24 hr capsule, Take 1 capsule (75 mg total) by mouth daily with breakfast. [place] till appointment,  Disp: 14 capsule, Rfl: 0 .  colchicine 0.6 MG tablet, Take 1 tablet (0.6 mg total) by mouth 2 (two) times daily as needed. (Patient not taking: Reported on 01/01/2017), Disp: 60 tablet, Rfl: 0 .  glucose blood test strip, Use as directed to check blood glucose daily, Disp: 100 each, Rfl: 2 .  Oxycodone HCl 20 MG TABS, Take 40 mg by mouth 2 (two) times daily. , Disp: , Rfl:  .  pantoprazole (PROTONIX) 40 MG tablet, Take 40 mg by mouth daily as needed., Disp: , Rfl:  .  promethazine (PHENERGAN) 25 MG tablet, Take 1 tablet (25 mg total) by mouth every 6 (six) hours as needed for nausea or vomiting. (Patient not taking: Reported on 06/21/2017), Disp: 30 tablet, Rfl: 0  Allergies  Allergen Reactions  . Augmentin [Amoxicillin-Pot Clavulanate] Diarrhea  . Penicillins Itching    ROS  Constitutional: Negative for fever or weight change.  Respiratory: Positive for chronic cough and shortness of breath states is at [place].   Cardiovascular: Negative for chest pain or palpitations.  Gastrointestinal: Negative for abdominal pain, no bowel changes.  Musculoskeletal: Positive for gait problem due to pain, no assistance needed, baseline, or joint swelling.  Skin: Negative for rash.  Neurological: Negative for dizziness or Positive chronic headache unchanged.  No other specific complaints in a complete review of systems (except as listed in HPI above).  Objective  Vitals:   06/21/17 1419  BP: 110/62  Pulse: (!) 114  Resp: 16  Temp: 98.4 F (36.9 C)  TempSrc: Oral  SpO2: 94%  Weight: 190 lb (86.2 kg)  Height: _0  (1.753 m)    Body mass index is 28.06 kg/m.  Nursing Note and Vital Signs reviewed.  Physical Exam   Constitutional: Patient appears well-developed and well-nourished.  No distress.  Cardiovascular: regular rhythm, S1/S2 present.  No murmur or rub heard.  Pulmonary/Chest: Effort normal and breath sounds expiratory wheezing throughout. No respiratory distress or  retractions. Abdominal: Soft and non-tender,  bowel sounds present  Skin: small red raised area to abdomen and left thigh, small scabbed are to left lower thigh, no rash, redness, blistering, or discharge noted.  Psychiatric: Patient has a normal mood and affect. behavior is normal. Judgment and thought content normal.  No results found for this or any previous visit (from the past 72 hour(s)).  Assessment & Plan  1. Bilateral leg edema - furosemide (LASIX) 40 MG tablet; Take 1 tablet (40 mg total) by mouth daily as needed.  Dispense: 30 tablet; Refill: 2 - COMPLETE METABOLIC PANEL WITH GFR  2. Essential (primary) hypertension Stable  - lisinopril (PRINIVIL,ZESTRIL) 2.5 MG tablet; Take 1 tablet (2.5 mg total) by mouth daily.  Dispense: 90 tablet; Refill: 0 - COMPLETE METABOLIC PANEL WITH GFR  3. Nausea without vomiting  - promethazine (PHENERGAN) 25 MG tablet; Take 1 tablet (25 mg total) by mouth every 8 (eight) hours as needed.  Dispense: 90 tablet; Refill: 0 - COMPLETE METABOLIC PANEL WITH GFR  4. Pure hypercholesterolemia  - rosuvastatin (CRESTOR) 5 MG tablet; Take 1 tablet (5 mg total) by mouth at bedtime.  Dispense: 90 tablet; Refill: 0 - Lipid Profile  5. Recurrent major depressive disorder, in partial remission (HCC) stable - venlafaxine XR (EFFEXOR-XR) 75 MG 24 hr capsule; Take 1 capsule (75 mg total) by mouth daily with breakfast.  Dispense: 90 capsule; Refill: 0  6. Mucopurulent chronic bronchitis (HCC)  - varenicline (CHANTIX STARTING MONTH PAK) 0.5 MG X 11 & 1 MG X 42 tablet; Take a 0.5 mg tablet by mouth once daily for 3 days, then a 0.5 mg tablet twice daily for 4 days, then a 1 mg tablet twice daily.  Dispense: 53 tablet; Refill: 0 - albuterol (VENTOLIN HFA) 108 (90 Base) MCG/ACT inhaler; Inhale 1 puff into the lungs as needed. Can substitute for cheaper option  Dispense: 1 Inhaler; Refill: 3 - fluticasone furoate-vilanterol (BREO ELLIPTA) 100-25 MCG/INH AEPB; Inhale  1 puff into the lungs 2 (two) times daily.  Dispense: 1 each; Refill: 6 - ipratropium-albuterol (DUONEB) 0.5-2.5 (3) MG/3ML SOLN; Inhale 3 mLs into the lungs every 6 (six) hours as needed.  Dispense: 360 mL; Refill: 3 - montelukast (SINGULAIR) 10 MG tablet; Take 1 tablet (10 mg total) by mouth daily. afternoon  Dispense: 90 tablet; Refill: 0 - CBC  7. Diabetes mellitus type 2, insulin dependent (HCC) Stable  - gabapentin (NEURONTIN) 600 MG tablet; Take 1 tablet (600 mg total) by mouth 3 (three) times daily.  Dispense: 90 tablet; Refill: 3 - glucose blood test strip; Use as directed to check blood glucose daily  Dispense: 100 each; Refill: 2 - metFORMIN (GLUCOPHAGE) 1000 MG tablet; Take 1 tablet (1,000 mg total) by mouth daily with breakfast.  Dispense: 90 tablet; Refill: 0 - COMPLETE METABOLIC PANEL WITH GFR  8. Chronic kidney disease (CKD) stage G3a/A2, moderately decreased glomerular filtration rate (GFR) between 45-59 mL/min/1.73 square meter and albuminuria creatinine ratio between 30-299 mg/g (HCC) - COMPLETE METABOLIC PANEL WITH GFR - CBC  9. COPD exacerbation ([employer]) - follow up with pulm if not improved in a week; ER precautions discussed.  - predniSONE (STERAPRED UNI-PAK 21 TAB) 10 MG (21) TBPK tablet; Take as directed  Dispense: 21 tablet; Refill: 0 - doxycycline (VIBRA-TABS) 100 MG tablet; Take 1 tablet (100 mg total) by mouth 2 (two) times daily.  Dispense: 20 tablet; Refill: 0  10. Tobacco Abuse  - counseling, chantix pending cmp  -Red flags and when to present for emergency care or  RTC including fever >101.63F, chest pain, shortness of breath, new/worsening/un-resolving symptoms,  reviewed with patient at time of visit. Follow up and care instructions discussed and provided in AVS. -Reviewed Health Maintenance: patient goes to [place] eye exam recommend eye appt

## 2017-06-21 NOTE — Patient Instructions (Addendum)
- Goal blood sugars 70- 120  - Please see [facility] for an appointment within the next 2 months and have them send [place] your records - prednisone taper, continue inhalers and if not improving then follow up with pulmonology  - DO not start chantix until you hear from [place] about your lab work.     Foods and drinks to limit include  . fried foods and other foods high in saturated fat and trans fat  . foods high in salt, also called sodium  . sweets, such as baked goods, candy, and ice cream  . beverages with added sugars, such as juice, regular soda, and regular sports or energy drinks  Drink water instead of sweetened beverages. Consider using a sugar substitute in your coffee or tea.   Instead, eat carbohydrates from fruit, vegetables, whole grains, beans, and low-fat or nonfat milk. Choose healthy carbohydrates, such as fruit, vegetables, whole grains, beans, and low-fat milk, as part of your diabetes meal plan.  Varenicline oral tablets What is this medicine? VARENICLINE (var EN i kleen) is used to help people quit smoking. It can reduce the symptoms caused by stopping smoking. It is used with a patient support program recommended by your physician. This medicine may be used for other purposes; ask your health care provider or pharmacist if you have questions. COMMON BRAND NAME(S): Chantix What should I tell my health care provider before I take this medicine? They need to know if you have any of these conditions: -bipolar disorder, depression, schizophrenia or other mental illness -heart disease -if you often drink alcohol -kidney disease -peripheral vascular disease -seizures -stroke -suicidal thoughts, plans, or attempt; a previous suicide attempt by you or a family member -an unusual or allergic reaction to varenicline, other medicines, foods, dyes, or preservatives -pregnant or trying to get pregnant -breast-feeding How should I use this medicine? Take this medicine by mouth  after eating. Take with a full glass of water. Follow the directions on the prescription label. Take your doses at regular intervals. Do not take your medicine more often than directed. There are 3 ways you can use this medicine to help you quit smoking; talk to your health care professional to decide which plan is right for you: 1) you can choose a quit date and start this medicine 1 week before the quit date, or, 2) you can start taking this medicine before you choose a quit date, and then pick a quit date between day 8 and 35 days of treatment, or, 3) if you are not sure that you are able or willing to quit smoking right away, start taking this medicine and slowly decrease the amount you smoke as directed by your health care professional with the goal of being cigarette-free by week 12 of treatment. Stick to your plan; ask about support groups or other ways to help you remain cigarette-free. If you are motivated to quit smoking and did not succeed during a previous attempt with this medicine for reasons other than side effects, or if you returned to smoking after this treatment, speak with your health care professional about whether another course of this medicine may be right for you. A special MedGuide will be given to you by the pharmacist with each prescription and refill. Be sure to read this information carefully each time. Talk to your pediatrician regarding the use of this medicine in children. This medicine is not approved for use in children. Overdosage: If you think you have taken too much  of this medicine contact a poison control center or emergency room at once. NOTE: This medicine is only for you. Do not share this medicine with others. What if I miss a dose? If you miss a dose, take it as soon as you can. If it is almost time for your next dose, take only that dose. Do not take double or extra doses. What may interact with this medicine? -alcohol or any product that contains  alcohol -insulin -other stop smoking aids -theophylline -warfarin This list may not describe all possible interactions. Give your health care provider a list of all the medicines, herbs, non-prescription drugs, or dietary supplements you use. Also tell them if you smoke, drink alcohol, or use illegal drugs. Some items may interact with your medicine. What should I watch for while using this medicine? Visit your doctor or health care professional for regular check ups. Ask for ongoing advice and encouragement from your doctor or healthcare professional, friends, and family to help you quit. If you smoke while on this medication, quit again Your mouth may get dry. Chewing sugarless gum or sucking hard candy, and drinking plenty of water may help. Contact your doctor if the problem does not go away or is severe. You may get drowsy or dizzy. Do not drive, use machinery, or do anything that needs mental alertness until you know how this medicine affects you. Do not stand or sit up quickly, especially if you are an older patient. This reduces the risk of dizzy or fainting spells. Sleepwalking can happen during treatment with this medicine, and can sometimes lead to behavior that is harmful to you, other people, or property. Stop taking this medicine and tell your doctor if you start sleepwalking or have other unusual sleep-related activity. Decrease the amount of alcoholic beverages that you drink during treatment with this medicine until you know if this medicine affects your ability to tolerate alcohol. Some people have experienced increased drunkenness (intoxication), unusual or sometimes aggressive behavior, or no memory of things that have happened (amnesia) during treatment with this medicine. The use of this medicine may increase the chance of suicidal thoughts or actions. Pay special attention to how you are responding while on this medicine. Any worsening of mood, or thoughts of suicide or dying  should be reported to your health care professional right away. What side effects may I notice from receiving this medicine? Side effects that you should report to your doctor or health care professional as soon as possible: -allergic reactions like skin rash, itching or hives, swelling of the face, lips, tongue, or throat -acting aggressive, being angry or violent, or acting on dangerous impulses -breathing problems -changes in vision -chest pain or chest tightness -confusion, trouble speaking or understanding -new or worsening depression, anxiety, or panic attacks -extreme increase in activity and talking (mania) -fast, irregular heartbeat -feeling faint or lightheaded, falls -fever -pain in legs when walking -problems with balance, talking, walking -redness, blistering, peeling or loosening of the skin, including inside the mouth -ringing in ears -seeing or hearing things that aren't there (hallucinations) -seizures -sleepwalking -sudden numbness or weakness of the face, arm or leg -thoughts about suicide or dying, or attempts to commit suicide -trouble passing urine or change in the amount of urine -unusual bleeding or bruising -unusually weak or tired Side effects that usually do not require medical attention (report to your doctor or health care professional if they continue or are bothersome): -constipation -headache -nausea, vomiting -strange dreams -stomach gas -trouble sleeping  This list may not describe all possible side effects. Call your doctor for medical advice about side effects. You may report side effects to FDA at 1-800-FDA-1088. Where should I keep my medicine? Keep out of the reach of children. Store at room temperature between 15 and 30 degrees C (59 and 86 degrees F). Throw away any unused medicine after the expiration date. NOTE: This sheet is a summary. It may not cover all possible information. If you have questions about this medicine, talk to your  doctor, pharmacist, or health care provider.  2018 Elsevier/Gold Standard (2014-10-21 16:14:23)

## 2017-06-26 DIAGNOSIS — M5430 Sciatica, unspecified side: Secondary | ICD-10-CM | POA: Diagnosis not present

## 2017-06-26 DIAGNOSIS — F17200 Nicotine dependence, unspecified, uncomplicated: Secondary | ICD-10-CM | POA: Diagnosis not present

## 2017-06-26 DIAGNOSIS — G894 Chronic pain syndrome: Secondary | ICD-10-CM | POA: Diagnosis not present

## 2017-06-26 DIAGNOSIS — Z79891 Long term (current) use of opiate analgesic: Secondary | ICD-10-CM | POA: Diagnosis not present

## 2017-06-26 DIAGNOSIS — Z87891 Personal history of nicotine dependence: Secondary | ICD-10-CM | POA: Diagnosis not present

## 2017-06-26 DIAGNOSIS — Z720 Tobacco use: Secondary | ICD-10-CM | POA: Diagnosis not present

## 2017-06-26 DIAGNOSIS — M545 Low back pain: Secondary | ICD-10-CM | POA: Diagnosis not present

## 2017-06-26 DIAGNOSIS — Z716 Tobacco abuse counseling: Secondary | ICD-10-CM | POA: Diagnosis not present

## 2017-08-15 ENCOUNTER — Other Ambulatory Visit: Payer: Self-pay | Admitting: Nurse Practitioner

## 2017-08-15 DIAGNOSIS — R110 Nausea: Secondary | ICD-10-CM

## 2017-09-14 ENCOUNTER — Other Ambulatory Visit: Payer: Self-pay | Admitting: Nurse Practitioner

## 2017-09-14 DIAGNOSIS — R110 Nausea: Secondary | ICD-10-CM

## 2017-10-15 ENCOUNTER — Other Ambulatory Visit: Payer: Self-pay | Admitting: Nurse Practitioner

## 2017-10-15 DIAGNOSIS — J411 Mucopurulent chronic bronchitis: Secondary | ICD-10-CM

## 2017-10-23 ENCOUNTER — Other Ambulatory Visit: Payer: Self-pay | Admitting: Nurse Practitioner

## 2017-10-23 DIAGNOSIS — R110 Nausea: Secondary | ICD-10-CM

## 2017-10-25 ENCOUNTER — Other Ambulatory Visit: Payer: Self-pay | Admitting: Nurse Practitioner

## 2017-10-25 DIAGNOSIS — R110 Nausea: Secondary | ICD-10-CM

## 2017-10-25 NOTE — Telephone Encounter (Signed)
Spoke with pt and informed that script has been sent to pharmacy. Pt did schedule appt for 9.10.19 with Dr [name]

## 2017-10-25 NOTE — Telephone Encounter (Signed)
Patient requesting refill of phenergan a PRN medication after given 90 pills last month. If having prolonged nausea she will need to see a specialist. Please have her get in with next available with her PCP to discuss this. Will provide a short-term dose, as this is sedating nausea medicine not meant to take routinely.

## 2017-10-29 ENCOUNTER — Encounter: Payer: Self-pay | Admitting: Family Medicine

## 2017-10-29 ENCOUNTER — Ambulatory Visit: Payer: Medicare PPO | Admitting: Family Medicine

## 2017-10-29 VITALS — BP 88/58 | HR 87 | Temp 97.6°F | Resp 16 | Ht 69.0 in | Wt 172.7 lb

## 2017-10-29 DIAGNOSIS — F331 Major depressive disorder, recurrent, moderate: Secondary | ICD-10-CM

## 2017-10-29 DIAGNOSIS — I5032 Chronic diastolic (congestive) heart failure: Secondary | ICD-10-CM

## 2017-10-29 DIAGNOSIS — R600 Localized edema: Secondary | ICD-10-CM

## 2017-10-29 DIAGNOSIS — E1142 Type 2 diabetes mellitus with diabetic polyneuropathy: Secondary | ICD-10-CM

## 2017-10-29 DIAGNOSIS — J411 Mucopurulent chronic bronchitis: Secondary | ICD-10-CM | POA: Diagnosis not present

## 2017-10-29 DIAGNOSIS — Z79899 Other long term (current) drug therapy: Secondary | ICD-10-CM

## 2017-10-29 DIAGNOSIS — Z794 Long term (current) use of insulin: Secondary | ICD-10-CM | POA: Diagnosis not present

## 2017-10-29 DIAGNOSIS — G4700 Insomnia, unspecified: Secondary | ICD-10-CM

## 2017-10-29 DIAGNOSIS — E785 Hyperlipidemia, unspecified: Secondary | ICD-10-CM | POA: Diagnosis not present

## 2017-10-29 DIAGNOSIS — E559 Vitamin D deficiency, unspecified: Secondary | ICD-10-CM

## 2017-10-29 DIAGNOSIS — Z23 Encounter for immunization: Secondary | ICD-10-CM | POA: Diagnosis not present

## 2017-10-29 DIAGNOSIS — E119 Type 2 diabetes mellitus without complications: Secondary | ICD-10-CM

## 2017-10-29 DIAGNOSIS — Z720 Tobacco use: Secondary | ICD-10-CM

## 2017-10-29 LAB — GLUCOSE, POCT (MANUAL RESULT ENTRY): POC GLUCOSE: 471 mg/dL — AB (ref 70–99)

## 2017-10-29 LAB — POCT GLYCOSYLATED HEMOGLOBIN (HGB A1C): HbA1c, POC (controlled diabetic range): 14 % — AB (ref 0.0–7.0)

## 2017-10-29 MED ORDER — INSULIN ASPART 100 UNIT/ML FLEXPEN: 4.0000 [IU] | PEN_INJECTOR | Freq: Two times a day (BID) | SUBCUTANEOUS | 2 refills | Status: DC

## 2017-10-29 MED ORDER — VENLAFAXINE HCL ER 150 MG PO CP24: 150.0000 mg | ORAL_CAPSULE | Freq: Every day | ORAL | 1 refills | Status: DC

## 2017-10-29 MED ORDER — BUDESONIDE-FORMOTEROL FUMARATE 160-4.5 MCG/ACT IN AERO: 2.0000 | INHALATION_SPRAY | Freq: Two times a day (BID) | RESPIRATORY_TRACT | 0 refills | Status: DC

## 2017-10-29 MED ORDER — VENLAFAXINE HCL ER 75 MG PO CP24: 75.0000 mg | ORAL_CAPSULE | Freq: Every day | ORAL | 1 refills | Status: DC

## 2017-10-29 MED ORDER — ROSUVASTATIN CALCIUM 5 MG PO TABS: 5.0000 mg | ORAL_TABLET | Freq: Every day | ORAL | 1 refills | Status: DC

## 2017-10-29 MED ORDER — METFORMIN HCL ER 750 MG PO TB24: 750.0000 mg | ORAL_TABLET | Freq: Every day | ORAL | 1 refills | Status: DC

## 2017-10-29 MED ORDER — INSULIN PEN NEEDLE 32G X 6 MM MISC: 2.0000 | Freq: Every day | 2 refills | Status: DC

## 2017-10-29 MED ORDER — INSULIN DEGLUDEC 100 UNIT/ML ~~LOC~~ SOPN: 20.0000 [IU] | PEN_INJECTOR | Freq: Every day | SUBCUTANEOUS | 0 refills | Status: DC

## 2017-10-29 MED ORDER — TRAZODONE HCL 50 MG PO TABS: 50.0000 mg | ORAL_TABLET | Freq: Every day | ORAL | 0 refills | Status: DC

## 2017-10-29 NOTE — Patient Instructions (Addendum)
Insulin Novolog:  Check fsbs prior to meals  If glucose below 140 give 4 units If glucose 140 to 180 give 6 units  If glucose 180 to 220  give 8 units If glucose above 220 give 10 units  Tresiba  Start at 10 units and go up by 2 units every 3 days until fasting glucose is between 90-140

## 2017-10-29 NOTE — Progress Notes (Signed)
Name: Tricia [name]   MRN: [id number]    DOB: 1961-01-18   Date:10/29/2017       Progress Note  Subjective  Chief Complaint  Chief Complaint  Patient presents with  . Medication Refill  . Diabetes    Needs a new meter-Has a eye exam scheduled for October 11   . Hyperlipidemia  . Depression  . Asthma    Has been ok-inhaler and Dueoneb is working-needs a new prescription    HPI  Diabetes type II: she used to see Dr. Manuella Ghazi, she has been out of Lantus and Novolog for months now, she has been taking Metformin 1000 mg daily. She felt like since last hgbA1C was 7.1% she did not need refills of medications. She has noticed polydipsia, polyuria but denies polyphagia. Eye appointment is scheduled for October 2019, urine micro up to date 02/2017 and it was normal, she has neuropathy and is taking gabapentin but she is not sure it helps, she also has dyslipidemia with very low HDL, but last LDL was close to goal on Crestor 5 mg daily. We will recheck level and adjust medication if needed. She stopped ACE since bp dropped with medication and can stay off medication since no microalbuminuria.   Asthma and COPD: under the care of Dr. Raul Del. Still has daily cough and intermittent SOB. She states she uses Ventolin daily but has been out of Symbicort - needs to go get rx, discussed importance of maintenance medication and regular follow ups  Major Depression: chronic and recurrent, phq 9 is higher. She lives with boyfriend and he has DM with diabetic foot ulcer and heart failure, she also worries about her daughter's the youngest one has multiple medical problems, but is making her feel frustrated because she seems to be faking seizures now.   CHF: doing well at this time, she takes lasix prn only, no swelling, she has orthopnea, but she states from post-nasal drainage. She used to see Dr. Fletcher Anon but wants to see someone else. She would like to see Dr. Ubaldo Glassing instead    Patient Active Problem List    Diagnosis Date Noted  . Benign neoplasm of descending colon   . Polyp of sigmoid colon   . 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 Polyneuropathy 10/21/2014  . Chronic venous insufficiency 10/05/2014  . Bilateral leg edema 08/16/2014  . Major depression in partial remission ([place]) 08/16/2014  . Acid reflux 08/16/2014  . 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 Asthma, moderate persistent 08/16/2014  . Carpal tunnel syndrome 08/16/2014  . Cervical pain 08/16/2014  . CAFL (chronic airflow limitation) ([place]) 08/16/2014  . Type 2 diabetes mellitus with peripheral neuropathy ([place]) 08/16/2014  . Diabetes mellitus type 2, insulin dependent ([place]) 08/16/2014  . Dyslipidemia 08/16/2014  . Current smoker 08/16/2014  . Essential (primary) hypertension 08/16/2014  . Benign neoplasm of stomach 08/16/2014  . Gout 08/16/2014  . HLD (hyperlipidemia) 08/16/2014  . Low back pain 08/16/2014  . Lumbar radiculopathy 08/16/2014  . Headache, migraine 08/16/2014  . Arthralgia of multiple joints 08/16/2014  . Avitaminosis D 08/16/2014  . Primary osteoarthritis of both knees 06/22/2014  . Benign essential tremor 10/02/2013  . Cervical dystonia 10/02/2013  . Chronic diastolic heart failure ([place]) 11/16/2012  . Chronic pain 11/13/2012    Past Surgical History:  Procedure Laterality Date  . CARPAL TUNNEL RELEASE Bilateral    x2 right, 1x on left  . COLONOSCOPY    . COLONOSCOPY WITH PROPOFOL N/A 04/25/2017   Procedure:  COLONOSCOPY WITH PROPOFOL;  Surgeon: [name], MD;  Location: [facility];  Service: Endoscopy;  Laterality: N/A;  diabetic-oral med  . DILATION AND CURETTAGE OF UTERUS    . EXTERNAL EAR SURGERY Left    x2  . POLYPECTOMY  04/25/2017   Procedure: POLYPECTOMY INTESTINAL;  Surgeon: [name], MD;  Location: [place];  Service: Endoscopy;;  . SPINE SURGERY     herniated disc  . TUBAL LIGATION      Family History  Problem Relation Age of Onset  . Emphysema  Mother   . Stroke Father   . Throat cancer Father   . Multiple sclerosis Daughter   . Cervical cancer Daughter     Social History   Socioeconomic History  . Marital status: Divorced    Spouse name: Not on file  . Number of children: 2  . Years of education: Not on file  . Highest education level: Not on file  Occupational History  . Occupation: Disability  Social Needs  . Financial resource strain: Not very hard  . Food insecurity:    Worry: Never true    Inability: Never true  . Transportation needs:    Medical: No    Non-medical: No  Tobacco Use  . Smoking status: Current Every Day Smoker    Packs/day: 1.00    Years: 38.00    Pack years: 38.00    Types: Cigarettes  . Smokeless tobacco: Never Used  Substance and Sexual Activity  . Alcohol use: No    Alcohol/week: 0.0 standard drinks  . Drug use: No  . Sexual activity: Not Currently  Lifestyle  . Physical activity:    Days per week: 0 days    Minutes per session: 0 min  . Stress: Very much  Relationships  . Social connections:    Talks on phone: Three times a week    Gets together: Once a week    Attends religious service: Never    Active member of club or organization: No    Attends meetings of clubs or organizations: Never    Relationship status: Divorced  . Intimate partner violence:    Fear of current or ex partner: No    Emotionally abused: No    Physically abused: No    Forced sexual activity: No  Other Topics Concern  . Not on file  Social History Narrative   Lives with her boyfriend, she has two daughters. Her youngest daughter  has multiple medical problems - used to do drugs and is now very sick, oldest lives in [place] still an alcoholic and drug addict.       Current Outpatient Medications:  .  ALPRAZolam (XANAX) 0.5 MG tablet, Take 1 tablet by mouth 3 (three) times daily as needed., Disp: , Rfl:  .  calcium carbonate (TUMS EX) 750 MG chewable tablet, Chew 2 tablets by mouth as needed for  heartburn., Disp: , Rfl:  .  cetirizine (ZYRTEC) 10 MG tablet, Take 10 mg by mouth as needed for allergies., Disp: , Rfl:  .  colchicine (COLCRYS) 0.6 MG tablet, , Disp: , Rfl:  .  furosemide (LASIX) 40 MG tablet, Take 1 tablet (40 mg total) by mouth daily as needed., Disp: 30 tablet, Rfl: 2 .  gabapentin (NEURONTIN) 600 MG tablet, Take 1 tablet (600 mg total) by mouth 3 (three) times daily., Disp: 90 tablet, Rfl: 3 .  glucose blood test strip, Use as directed to check blood glucose daily, Disp: 100 each, Rfl:  2 .  ipratropium-albuterol (DUONEB) 0.5-2.5 (3) MG/3ML SOLN, Inhale 3 mLs into the lungs every 6 (six) hours as needed., Disp: 360 mL, Rfl: 3 .  montelukast (SINGULAIR) 10 MG tablet, Take 1 tablet (10 mg total) by mouth daily. afternoon, Disp: 90 tablet, Rfl: 0 .  morphine (MS CONTIN) 15 MG 12 hr tablet, Take 1 tablet by mouth 2 (two) times daily., Disp: , Rfl:  .  naproxen (NAPROSYN) 500 MG tablet, , Disp: , Rfl:  .  oxyCODONE (ROXICODONE) 15 MG immediate release tablet, Take 15 mg by mouth every 6 (six) hours as needed for pain., Disp: , Rfl:  .  promethazine (PHENERGAN) 25 MG tablet, TAKE 1 TABLET BY MOUTH EVERY 8 HOURS AS NEEDED, Disp: 20 tablet, Rfl: 0 .  ranitidine (ZANTAC) 150 MG tablet, Take 1 tablet by mouth 2 (two) times daily as needed. , Disp: , Rfl:  .  rizatriptan (MAXALT) 5 MG tablet, Take 5 mg by mouth as needed., Disp: , Rfl:  .  rosuvastatin (CRESTOR) 5 MG tablet, Take 1 tablet (5 mg total) by mouth at bedtime., Disp: 90 tablet, Rfl: 1 .  theophylline (UNIPHYL) 400 MG 24 hr tablet, daily., Disp: , Rfl: 10 .  tiotropium (SPIRIVA HANDIHALER) 18 MCG inhalation capsule, Place into inhaler and inhale daily. pm, Disp: , Rfl:  .  tiZANidine (ZANAFLEX) 4 MG tablet, Take 1 tablet by mouth 4 (four) times daily., Disp: , Rfl:  .  topiramate (TOPAMAX) 100 MG tablet, Take 1 tablet by mouth 2 (two) times daily., Disp: , Rfl:  .  traZODone (DESYREL) 50 MG tablet, Take 1 tablet (50 mg  total) by mouth at bedtime., Disp: 30 tablet, Rfl: 0 .  VENTOLIN HFA 108 (90 Base) MCG/ACT inhaler, INHALE 1 PUFF BY MOUTH AS NEEDED, Disp: 18 g, Rfl: 0 .  budesonide-formoterol (SYMBICORT) 160-4.5 MCG/ACT inhaler, Inhale 2 puffs into the lungs 2 (two) times daily., Disp: 1 Inhaler, Rfl: 0 .  insulin aspart (NOVOLOG FLEXPEN) 100 UNIT/ML FlexPen, Inject 4-10 Units into the skin 2 (two) times daily with a meal., Disp: 15 mL, Rfl: 2 .  insulin degludec (TRESIBA FLEXTOUCH) 100 UNIT/ML SOPN FlexTouch Pen, Inject 0.2-0.5 mLs (20-50 Units total) into the skin daily., Disp: 9 mL, Rfl: 0 .  Insulin Pen Needle 32G X 6 MM MISC, 2 each by Does not apply route daily., Disp: 200 each, Rfl: 2 .  metFORMIN (GLUCOPHAGE-XR) 750 MG 24 hr tablet, Take 1 tablet (750 mg total) by mouth daily with breakfast., Disp: 90 tablet, Rfl: 1 .  venlafaxine XR (EFFEXOR-XR) 150 MG 24 hr capsule, Take 1 capsule (150 mg total) by mouth daily with breakfast., Disp: 90 capsule, Rfl: 1  Allergies  Allergen Reactions  . Augmentin [Amoxicillin-Pot Clavulanate] Diarrhea  . Penicillins Itching     ROS  Constitutional: Negative for fever, positive for  weight change.  Respiratory: Positive for cough and intermittent  shortness of breath.   Cardiovascular: Negative for chest pain or palpitations.  Gastrointestinal: Negative for abdominal pain, no bowel changes.  Musculoskeletal: positive  for gait problem and intermittent knee oint swelling.   Skin: Negative for rash.  Neurological: Negative for dizziness, positive for intermittent  headache.  No other specific complaints in a complete review of systems (except as listed in HPI above).  Objective  Vitals:   10/29/17 1415 10/29/17 1430  BP: (!) 98/58 (!) 88/58  Pulse: (!) 106 87  Resp: 16   Temp: 97.6 F (36.4 C)   TempSrc: Oral  SpO2: 95%   Weight: 172 lb 11.2 oz (78.3 kg)   Height: 5\' 9"  (1.753 m)     Body mass index is 25.5 kg/m.  Physical Exam  Constitutional:  Patient appears well-developed and well-nourished.  No distress.  HEENT: head atraumatic, normocephalic, pupils equal and reactive to light, throat within normal limits Cardiovascular: Normal rate, regular rhythm and normal heart sounds.  No murmur heard. Dry lower extremity with some pilling, non pitting edema and increase pigmentation Pulmonary/Chest: Effort normal , bilateral wheezing and rhonchi. No respiratory distress. Abdominal: Soft.  There is no tenderness. Muscular Skeletal: decrease rom of her neck  Psychiatric: Patient has a normal mood and affect. behavior is normal. Judgment and thought content normal.  Recent Results (from the past 2160 hour(s))  POCT HgB A1C     Status: Abnormal   Collection Time: 10/29/17  2:18 PM  Result Value Ref Range   Hemoglobin A1C     HbA1c POC (<> result, manual entry)     HbA1c, POC (prediabetic range)     HbA1c, POC (controlled diabetic range) 14.0 (A) 0.0 - 7.0 %    Comment: >14.0  POCT Glucose (CBG)     Status: Abnormal   Collection Time: 10/29/17  2:19 PM  Result Value Ref Range   POC Glucose 471 (A) 70 - 99 mg/dl      PHQ2/9: Depression screen [facility] 2/9 10/29/2017 06/21/2017 02/20/2017 01/01/2017 12/19/2016  Decreased Interest 1 0 0 0 0  Down, Depressed, Hopeless 1 1 1 3  0  PHQ - 2 Score 2 1 1 3  0  Altered sleeping 3 - 1 3 -  Tired, decreased energy 3 - 1 3 -  Change in appetite 2 - 1 2 -  Feeling bad or failure about yourself  1 - 0 3 -  Trouble concentrating 2 - 1 3 -  Moving slowly or fidgety/restless 0 - 0 0 -  Suicidal thoughts 0 - 0 0 -  PHQ-9 Score 13 - 5 17 -  Difficult doing work/chores Somewhat difficult - Not difficult at all Very difficult -     Fall Risk: Fall Risk  10/29/2017 06/21/2017 02/20/2017 01/01/2017 12/19/2016  Falls in the past year? Yes No Yes Yes No  Number falls in past yr: 2 or more - 2 or more 2 or more -  Injury with Fall? Yes - No No -  Comment - - - - -  Risk for fall due to : - - - Impaired  vision;Medication side effect -  Risk for fall due to: Comment - - - in need of new Rx eye glasses -  Follow up - - Falls evaluation completed Education provided;Falls prevention discussed -    Assessment & Plan  1. Diabetes mellitus type 2, insulin dependent (HCC)  - POCT HgB A1C - POCT Glucose (CBG) - insulin degludec (TRESIBA FLEXTOUCH) 100 UNIT/ML SOPN FlexTouch Pen; Inject 0.2-0.5 mLs (20-50 Units total) into the skin daily.  Dispense: 9 mL; Refill: 0 - insulin aspart (NOVOLOG FLEXPEN) 100 UNIT/ML FlexPen; Inject 4-10 Units into the skin 2 (two) times daily with a meal.  Dispense: 15 mL; Refill: 2 - metFORMIN (GLUCOPHAGE-XR) 750 MG 24 hr tablet; Take 1 tablet (750 mg total) by mouth daily with breakfast.  Dispense: 90 tablet; Refill: 1 - Insulin Pen Needle 32G X 6 MM MISC; 2 each by Does not apply route daily.  Dispense: 200 each; Refill: 2 - Ambulatory referral to Chronic Care Management Services  2. Need  for immunization against influenza  - Flu Vaccine QUAD 6+ mos PF IM (Fluarix Quad PF)  3. Mucopurulent chronic bronchitis (HCC)  - budesonide-formoterol (SYMBICORT) 160-4.5 MCG/ACT inhaler; Inhale 2 puffs into the lungs 2 (two) times daily.  Dispense: 1 Inhaler; Refill: 0  4. Dyslipidemia (high LDL; low HDL)  - rosuvastatin (CRESTOR) 5 MG tablet; Take 1 tablet (5 mg total) by mouth at bedtime.  Dispense: 90 tablet; Refill: 1 - Lipid panel  5. Moderate recurrent major depression (HCC)  We will adjust dose of Effexor since depression is worse  - venlafaxine XR (EFFEXOR-XR) 150MG  24 hr capsule; Take 1 capsule (150 mg total) by mouth daily with breakfast.  Dispense: 90 capsule; Refill: 1  6. Insomnia, unspecified type  - traZODone (DESYREL) 50 MG tablet; Take 1 tablet (50 mg total) by mouth at bedtime.  Dispense: 30 tablet; Refill: 0  7. Bilateral leg edema   8. Chronic diastolic heart failure (HCC)  - CBC with Differential/Platelet - COMPLETE METABOLIC PANEL WITH  GF - referral Dr. Ubaldo Glassing   9. Type 2 diabetes mellitus with peripheral neuropathy (HCC)  Stable   10. Vitamin D deficiency  - VITAMIN D 25 Hydroxy (Vit-D Deficiency, Fractures)  11. Tobacco abuse   12. Long-term use of high-risk medication  - Vitamin B12

## 2017-10-30 ENCOUNTER — Ambulatory Visit: Payer: Self-pay | Admitting: *Deleted

## 2017-10-30 ENCOUNTER — Other Ambulatory Visit: Payer: Self-pay | Admitting: Family Medicine

## 2017-10-30 ENCOUNTER — Telehealth: Payer: Self-pay

## 2017-10-30 DIAGNOSIS — Z794 Long term (current) use of insulin: Secondary | ICD-10-CM | POA: Diagnosis not present

## 2017-10-30 DIAGNOSIS — E785 Hyperlipidemia, unspecified: Secondary | ICD-10-CM | POA: Diagnosis not present

## 2017-10-30 DIAGNOSIS — I5032 Chronic diastolic (congestive) heart failure: Secondary | ICD-10-CM

## 2017-10-30 DIAGNOSIS — E119 Type 2 diabetes mellitus without complications: Secondary | ICD-10-CM

## 2017-10-30 LAB — CBC WITH DIFFERENTIAL/PLATELET
BASOS ABS: 50 {cells}/uL (ref 0–200)
Basophils Relative: 0.8 %
EOS ABS: 161 {cells}/uL (ref 15–500)
EOS PCT: 2.6 %
HCT: 42.7 % (ref 35.0–45.0)
HEMOGLOBIN: 13.9 g/dL (ref 11.7–15.5)
LYMPHS ABS: 3044 {cells}/uL (ref 850–3900)
MCH: 31.4 pg (ref 27.0–33.0)
MCHC: 32.6 g/dL (ref 32.0–36.0)
MCV: 96.4 fL (ref 80.0–100.0)
MPV: 10.7 fL (ref 7.5–12.5)
Monocytes Relative: 6.5 %
NEUTROS ABS: 2542 {cells}/uL (ref 1500–7800)
NEUTROS PCT: 41 %
PLATELETS: 181 10*3/uL (ref 140–400)
RBC: 4.43 10*6/uL (ref 3.80–5.10)
RDW: 11.8 % (ref 11.0–15.0)
Total Lymphocyte: 49.1 %
WBC: 6.2 10*3/uL (ref 3.8–10.8)
WBCMIX: 403 {cells}/uL (ref 200–950)

## 2017-10-30 LAB — COMPLETE METABOLIC PANEL WITH GFR
AG RATIO: 1.5 (calc) (ref 1.0–2.5)
ALKALINE PHOSPHATASE (APISO): 106 U/L (ref 33–130)
ALT: 9 U/L (ref 6–29)
AST: 13 U/L (ref 10–35)
Albumin: 3.6 g/dL (ref 3.6–5.1)
BILIRUBIN TOTAL: 0.3 mg/dL (ref 0.2–1.2)
BUN: 9 mg/dL (ref 7–25)
CALCIUM: 8.7 mg/dL (ref 8.6–10.4)
CHLORIDE: 101 mmol/L (ref 98–110)
CO2: 28 mmol/L (ref 20–32)
Creat: 0.89 mg/dL (ref 0.50–1.05)
GFR, EST NON AFRICAN AMERICAN: 72 mL/min/{1.73_m2} (ref >=60)
GFR, Est African American: 83 mL/min/{1.73_m2} (ref >=60)
GLOBULIN: 2.4 g/dL (ref 1.9–3.7)
Glucose, Bld: 475 mg/dL — ABNORMAL HIGH (ref 65–139)
POTASSIUM: 3.6 mmol/L (ref 3.5–5.3)
SODIUM: 136 mmol/L (ref 135–146)
Total Protein: 6 g/dL — ABNORMAL LOW (ref 6.1–8.1)

## 2017-10-30 LAB — LIPID PANEL
CHOL/HDL RATIO: 3.7 (calc) (ref <5.0)
CHOLESTEROL: 106 mg/dL (ref <200)
HDL: 29 mg/dL — ABNORMAL LOW (ref >50)
LDL Cholesterol (Calc): 50 mg/dL (calc)
Non-HDL Cholesterol (Calc): 77 mg/dL (calc) (ref <130)
Triglycerides: 203 mg/dL — ABNORMAL HIGH (ref <150)

## 2017-10-30 LAB — VITAMIN B12: Vitamin B-12: 958 pg/mL (ref 200–1100)

## 2017-10-30 LAB — VITAMIN D 25 HYDROXY (VIT D DEFICIENCY, FRACTURES): Vit D, 25-Hydroxy: 12 ng/mL — ABNORMAL LOW (ref 30–100)

## 2017-10-30 MED ORDER — VITAMIN D (ERGOCALCIFEROL) 1.25 MG (50000 UNIT) PO CAPS: 50000.0000 [IU] | ORAL_CAPSULE | ORAL | 0 refills | Status: DC

## 2017-10-30 NOTE — Telephone Encounter (Signed)
[facility] called to inform Dr. [name] her results are back and her Glucose was critical reading 475.

## 2017-10-30 NOTE — Progress Notes (Signed)
  Chronic Care Management   Note  10/30/2017 Name: Tricia [name] MRN: [id number] DOB: 03-Jan-1961  Telephone Outreach to Tricia [name] today in response to a CCM referral order placed by Dr. [name] for assistance with chronic health conditions including DM, HLD, & Chronic Diastolic Heart Failure as well as medication management and assistance.   Tricia [name] was given information about Chronic Care Management services today including:  1. CCM service includes personalized support from designated clinical staff supervised by her physician, including individualized plan of care and coordination with other care providers 2. 24/7 contact phone numbers for assistance for urgent and routine care needs. 3. Service will only be billed when office clinical staff spend 20 minutes or more in a month to coordinate care. 4. Only one practitioner may furnish and bill the service in a calendar month. 5. The patient may stop CCM services at any time (effective at the end of the month) by phone call to the office staff. 6. The patient will be responsible for cost sharing (co-pay) of up to 20% of the service fee (after annual deductible is met).  Patient agreed to services and verbal consent obtained.  Plan: Tricia [name] is scheduled for a joint visit with the CCM team Nurse and Pharmacist on 11/04/17 @ 2:30pm.    [facility] Care Management  [phone]

## 2017-10-30 NOTE — Patient Instructions (Signed)
Please bring ALL OF YOUR MEDICATIONS to your appointment. Thank you!  CCM (Chronic Care Management) Team    [place] Nurse Care Coordinator  [phone]   Ruben Reason PharmD  Clinical Pharmacist  [phone]

## 2017-10-31 NOTE — Progress Notes (Signed)
CCM Team aware and will reach out to patient today.   [facility] Management  [phone]

## 2017-11-05 ENCOUNTER — Ambulatory Visit: Payer: Medicare PPO

## 2017-11-05 ENCOUNTER — Telehealth: Payer: Self-pay | Admitting: Family Medicine

## 2017-11-05 VITALS — BP 104/68

## 2017-11-05 DIAGNOSIS — I10 Essential (primary) hypertension: Secondary | ICD-10-CM

## 2017-11-05 DIAGNOSIS — M1A4710 Other secondary chronic gout, right ankle and foot, without tophus (tophi): Secondary | ICD-10-CM

## 2017-11-05 DIAGNOSIS — I5032 Chronic diastolic (congestive) heart failure: Secondary | ICD-10-CM

## 2017-11-05 DIAGNOSIS — R600 Localized edema: Secondary | ICD-10-CM

## 2017-11-05 NOTE — Telephone Encounter (Signed)
Pt is requesting cough syrup for her cough

## 2017-11-05 NOTE — Patient Instructions (Addendum)
1. Weigh yourself every day and write it on your chart   Call the doctor or your care manager or pharmacist if you gain 3lb overnight or 5lb in a week OR if you have new or worsened swelling or shortness of breath  2. Tricia [name] will call you about medication assistance and will get medication applications ready for you.   3. Be on the lookout for your [employer]. When you get it, order a blood pressure machine. If you want to bring it by the office, we'll help you get started.   4. Price glasses at [facility] after your eye exam to compare prices.   28. Tricia [name] will talk with Dr. [name] about your blood pressure and dizziness. CALL THE DOCTOR or GO TO THE ER if you experience worsening dizziness, chest pain, or shortness of breath.   6. Review the diet information about Gout and make adjustments if you are eating any of the "avoid" foods regularly. (Trying cutting back on fruit juice first).   How Does Food Affect Gout? If you have gout, certain foods may trigger an attack by raising your uric acid levels. Trigger foods are commonly high in purines, a substance found naturally in foods. When you digest purines, your body makes uric acid as a waste product  This is not a concern for healthy people, as they efficiently remove excess uric acid from the body. However, people with gout can't efficiently remove excess uric acid. Thus, a high-purine diet may let uric acid accumulate and cause a gout attack. Fortunately, research shows that restricting high-purine foods and taking the appropriate medication can prevent gout attacks. Foods that commonly trigger gout attacks include organ meats, red meats, seafood, alcohol and beer. They contain a moderate-to-high amount of purines. However, there is one exception to this rule. Research shows that high-purine vegetables do not trigger gout attack. And interestingly, fructose and sugar-sweetened beverages can increase the risk of gout and gout attacks, even  though they're not purine-rich. Instead, they may raise uric acid levels by accelerating several cellular processes.  For instance, a study including over 125,000 participants found that people who consumed the most fructose had a 62% higher risk of developing gout.  On the other hand, research shows that low-fat dairy products, soy products and vitamin C supplements may help prevent gout attacks by reducing blood uric acid levels. Full-fat and high-fat dairy products don't seem to affect uric acid levels.  Summary: Foods can either raise or lower your uric acid levels, depending on their purine content. However, fructose can raise your uric acid levels even though it is not purine-rich. What Foods Should You Avoid? If you're susceptible to sudden gout attacks, avoid the main culprits - high-purine foods. These are foods that contain more than 200 mg of purines per 3.5 ounces (100 grams). You should also avoid high-fructose foods, as well as moderately-high-purine foods, which contain 150-200 mg of purines per 3.5 ounces. These may trigger a gout attack.  Here are a few major high-purine foods, moderately-high-purine foods and high-fructose foods to avoid: All organ meats: These include liver, kidneys, sweetbreads and brain  Game meats: Examples include pheasant, veal and venison  Fish: Herring, trout, mackerel, tuna, sardines, anchovies, haddock and more  Other seafood: Scallops, crab, shrimp and roe  Sugary beverages: Especially fruit juices and sugary sodas  Added sugars: Honey, agave nectar and high-fructose corn syrup  Yeasts: Nutritional yeast, brewer's yeast and other yeast supplements Additionally, refined carbs like white bread, cakes and  cookies should be avoided. Although they are not high in purines or fructose, they are low in nutrients and may raise your uric acid levels. Summary: If you have gout, you should avoid foods like organ meats, game meats, fish and seafood, sugary  beverages, refined carbs, added sugars and yeast. What Foods Should You Eat? Although a gout-friendly diet eliminates many foods, there are still plenty of low-purine foods you can enjoy.  Foods are considered low-purine when they have less than 100 mg of purines per 3.5 ounces (100 grams). Here are some low-purine foods that are generally safe for people with gout: Fruits: All fruits are generally fine for gout. Cherries may even help prevent attacks by lowering uric acid levels and reducing inflammation .  Vegetables: All vegetables are fine, including potatoes, peas, mushrooms, eggplants and dark green leafy vegetables.  Legumes: All legumes are fine, including lentils, beans, soybeans and tofu.  Nuts: All nuts and seeds.  Whole grains: These include oats, brown rice and barley.  Dairy products: All dairy is safe, but low-fat dairy appears to be especially beneficial.  Eggs  Beverages: Coffee, tea and green tea.  Herbs and spices: All herbs and spices.  Plant-based oils: Including canola, coconut, olive and flax oils. Foods You Can Eat in Moderation Aside from organ meats, game meats and certain fish, most meats can be consumed in moderation. You should limit yourself to 4-6 ounces (115-170 grams) of these a few times per week. They contain a moderate amount of purines, which is considered to be 100-200 mg per 100 grams. Thus, eating too much of them may trigger a gout attack.  Meats: These include chicken, beef, pork and lamb.  Other fish: Fresh or canned salmon generally contains lower levels of purines than most other fish. Summary: Foods you should eat with gout include all fruits and vegetables, whole grains, low-fat dairy products, eggs and most beverages. Limit your consumption of non-organ meats and fish like salmon to servings of 4-6 ounces (115-170 grams) a few times weekly.   Tricia [name] was given information about Chronic Care Management services today including:  1. CCM  service includes personalized support from designated clinical staff supervised by her physician, including individualized plan of care and coordination with other care providers 2. 24/7 contact phone numbers for assistance for urgent and routine care needs. 3. Service will only be billed when office clinical staff spend 20 minutes or more in a month to coordinate care. 4. Only one practitioner may furnish and bill the service in a calendar month. 5. The patient may stop CCM services at any time (effective at the end of the month) by phone call to the office staff. 6. The patient will be responsible for cost sharing (co-pay) of up to 20% of the service fee (after annual deductible is met).  Patient agreed to services and verbal consent obtained.   CCM (Chronic Care Management) Team    [place] Nurse Care Coordinator  [phone]   Ruben Reason PharmD  Clinical Pharmacist  [phone]

## 2017-11-05 NOTE — Telephone Encounter (Signed)
Patient notified

## 2017-11-05 NOTE — Telephone Encounter (Signed)
She just saw Dr. Raul Del on 09/11 and she can call his office

## 2017-11-06 NOTE — Chronic Care Management (AMB) (Signed)
Chronic Care Management   Initial Visit Note  11/05/2017 Name: LAYLYNN CAMPANELLA MRN: [id number] DOB: 16-Jun-1960  Referred by: [name], MD Reason for referral : No chief complaint on file.  Subjective: "I really need help keeping all this medicine straight and figuring out how to keep this fluid off."  Objective: BP 104/68     Assessment: 57 year old female primary care patient of Dr. [name] referred to case management team for assistance with chronic health conditions including DM, HLD, & Chronic Diastolic Heart Failure as well as medication management and assistance.   Medication Management and Assistance Needs - please see detailed note from clinic Pharmacist re: Medication management and assistance needs   Knowledge Deficits related to CHF Self Health Management - Ms. Weisheit states her plan for management of CHF is to take Lasix 63m "if I get swollen". She is not weighing herself routinely, has limited understanding of CHF as a disease process, and does not have an action plan.   Cardiovascular Symptoms - Ms. RAbsherrelates today that she regularly experiences lightheadedness/dizziness. She says she became dizzy and lightheaded twice in the shower today and almost fell. She tells me also that she has noted several lower than normal blood pressure readings at various provider office visits.   Ms. RBungertis not monitoring her blood pressure at home. She was unaware that she had access to a catalog through her insurance provider wherein she can order a blood pressure monitor for herself. We will see that Ms. RFlettreceives a catalog so that she can order a monitor. When she receives the monitor, if she is unsure how to use it, she is to call [place] we will help her set it up.   BP Readings from Last 3 Encounters:  11/05/17 104/68  10/29/17 (!) 88/58  06/21/17 110/62    Goals Addressed            This Visit's Progress   . "I want to make  sure I'm managing my heart failure right" (pt-stated)     Clinical Goals:   Over the next 30 days, patient will verbalize understanding of action plan for management of CHF symptoms  Over the next 30 days, patient will weigh herself daily and record on provided documentation too  Over the next 30 days, patient will call her cardiology provider or her case management team member for weight gain of 3lb overnight, 5 lb in a week, or new or worsened symptoms related to CHF  Interventions:    Education provided re: CHF and symptom management  Daily weight documentation tool provided  Follow Up discussion with PCP re: blood pressure symptoms  CCM team will have an insurance company provided catalog to patient (blood pressure monitor)   Knowledge Deficits related to long term management of gout - Ms. RHoppeltells me today that management of her gout is an ongoing problem. She says that flares come unexpectedly and are typically severe. She has limited knowledge of dietary management.   Goals Addressed    . "I want to see if I can keep my gout from flaring up" (pt-stated)     Clinical Goals:Over the next 30 days, patient will verbalize improved knowledge of dietary interventions recommended for management of gout.   Interventions:   Print gout diet educational material provided and reviewed with patient  Request for cough medication - patient states she would like to inquire with Dr. SAncil Boozerre: getting a prescription for cough medication sent  to her pharmacy so she will have it on hand when she experiences intermittent periods of coughing.   Goals Addressed            This Visit's Progress   . "I need to get some cough medicine for when my cough flares up" (pt-stated)     Clinical Goals: Over the next 7 days, patient will verbalize understanding of provider recommendation for management of intermittent cough.   Intervention: Notified provider of patient report of symptoms and  request   Plan: I will follow up with Ms. Trager by phone within 7 days.    Ms. Zeoli was given information about Chronic Care Management services today including:  1. CCM service includes personalized support from designated clinical staff supervised by her physician, including individualized plan of care and coordination with other care providers 2. 24/7 contact phone numbers for assistance for urgent and routine care needs. 3. Service will only be billed when office clinical staff spend 20 minutes or more in a month to coordinate care. 4. Only one practitioner may furnish and bill the service in a calendar month. 5. The patient may stop CCM services at any time (effective at the end of the month) by phone call to the office staff. 6. The patient will be responsible for cost sharing (co-pay) of up to 20% of the service fee (after annual deductible is met).  Patient agreed to services and verbal consent obtained.  [facility] / [place] Management  [phone]

## 2017-11-12 ENCOUNTER — Ambulatory Visit: Payer: Medicare PPO | Admitting: Family Medicine

## 2017-11-18 ENCOUNTER — Telehealth: Payer: Self-pay

## 2017-11-18 DIAGNOSIS — R110 Nausea: Secondary | ICD-10-CM

## 2017-11-18 NOTE — Telephone Encounter (Signed)
It was given by Benjamine Mola, she needs to be seen so we can evaluated the cause of nausea. Is it medication induced? GI induced? DM? I need a diagnosis to be able to prescribed it.

## 2017-11-18 NOTE — Telephone Encounter (Signed)
Patient stated that she is still nauseated and she cannot afford the medication for her tremors. Patient is requesting a refill on the Phenergan.  Refill request was sent to Dr. [name] for approval and submission.

## 2017-11-19 NOTE — Telephone Encounter (Signed)
Please schedule patient for a f/u visit.

## 2017-11-19 NOTE — Telephone Encounter (Signed)
LVM for pt to call the office and schedule an appt

## 2017-11-27 ENCOUNTER — Other Ambulatory Visit: Payer: Self-pay | Admitting: Family Medicine

## 2017-11-27 DIAGNOSIS — G4700 Insomnia, unspecified: Secondary | ICD-10-CM

## 2017-11-27 NOTE — Telephone Encounter (Signed)
Refill request for general medication. Trazodone to [facility].   Last office visit 10/29/2017   Follow 12/02/2017

## 2017-12-02 ENCOUNTER — Ambulatory Visit: Payer: Medicare PPO | Admitting: Family Medicine

## 2017-12-23 ENCOUNTER — Telehealth: Payer: Self-pay

## 2017-12-23 ENCOUNTER — Other Ambulatory Visit: Payer: Self-pay

## 2017-12-23 ENCOUNTER — Ambulatory Visit: Payer: Self-pay | Admitting: *Deleted

## 2017-12-23 DIAGNOSIS — N1831 Chronic kidney disease, stage 3a: Secondary | ICD-10-CM

## 2017-12-23 DIAGNOSIS — E119 Type 2 diabetes mellitus without complications: Secondary | ICD-10-CM

## 2017-12-23 DIAGNOSIS — N183 Chronic kidney disease, stage 3 (moderate): Secondary | ICD-10-CM

## 2017-12-23 DIAGNOSIS — Z794 Long term (current) use of insulin: Principal | ICD-10-CM

## 2017-12-23 MED ORDER — BASAGLAR KWIKPEN 100 UNIT/ML ~~LOC~~ SOPN: 20.0000 [IU] | PEN_INJECTOR | Freq: Every day | SUBCUTANEOUS | 0 refills | Status: DC

## 2017-12-23 MED ORDER — INSULIN LISPRO 100 UNIT/ML (KWIKPEN): 4.0000 [IU] | PEN_INJECTOR | Freq: Two times a day (BID) | SUBCUTANEOUS | 0 refills | Status: DC

## 2017-12-23 NOTE — Telephone Encounter (Signed)
Copied from [place] [id number]. Topic: General - Inquiry >> Dec 23, 2017  9:48 AM Tricia [name] wrote: Reason for CRM: Patient is requesting a call in regards to her last appt, she stated she was given some numbers to contact in regards to her medicare. Please advise  I called this patient to see how I can be of help and she stated that she had missed placed the chronic care management teams' number that was on the AVS from her last visit with them.  Their information was given to her.   CCM (Chronic Care Management) Team    [place] Nurse Care Coordinator  [phone]   Ruben Reason PharmD  Clinical Pharmacist  [phone]

## 2017-12-23 NOTE — Patient Instructions (Addendum)
1. [place] for assistance with medications 2. Ruben Reason PharmD (Clinic Pharmacist @ [facility]) will reach out to you about patient assistance programs for your insulin.  3. [name] Fountain RN Case Manager will follow up with you about diabetes management.   Goals Addressed            This Visit's Progress   . "I need help straightening out my medicine" (pt-stated)       Clinical Goal(s):   Over the next 24 hours, patient will call [employer] @ [phone] to discuss eligibility for Basaglar/Humalog and will return a call to the case management team to notify of approval/disapproval  Over the next 7 days, patient will verbalize understanding of resources for medication needs  Interventions: Provided [employer] patient assistance contact information and instructions     The patient verbalized understanding of instructions provided today and declined a print copy of patient instruction materials.   CCM (Chronic Care Management) Team   [name] Fountain RN, BSN Nurse Care Coordinator  [phone]  Ruben Reason PharmD  Clinical Pharmacist  [phone]

## 2017-12-23 NOTE — Chronic Care Management (AMB) (Signed)
  Chronic Care Management   Follow Up Note   12/23/2017 Name: Tricia [name] MRN: [id number] DOB: Jun 20, 1960  Referred by: Tricia Sizer, MD Reason for referral : Care Coordination (Medication Management/Assistance)  Subjective: "I can't afford my insulin"  Objective:  Lab Results  Component Value Date   HGBA1C 14.0 (A) 10/29/2017   HGBA1C 7.1 02/20/2017   HGBA1C 6.4 (H) 09/18/2016   Lab Results  Component Value Date   MICROALBUR 2.0 02/20/2017   LDLCALC 50 10/29/2017   CREATININE 0.89 10/29/2017   Assessment: 57 year old female primary care patient of Dr. Steele [name] referred to case management team for assistance with chronic health conditions including DM, HLD, & Chronic Diastolic Heart Failure as well as medication management and assistance.  Goals Addressed            This Visit's Progress   . "I need help straightening out my medicine" (pt-stated)        Medication Management/Assistance Needs - call received from patient who reports she is out of [place] as of today. She states that her copay for both medications together = $141 and she is unable to pay for this. I collaborated with our Surveyor, minerals who recommended that the patient reach out to [facility] then follow up with our clinic pharmacist re: patient assistance eligibility for [place] and Novolog. I provided the patient with contact information and instructions as outlined below.   Clinical Goal(s):   Over the next 24 hours, patient will call [employer] @ [phone] to discuss eligibility for Basaglar/Humalog and will return a call to the case management team to notify of approval/disapproval  Over the next 7 days, patient will verbalize understanding of resources for medication needs  Interventions: Provided [employer] patient assistance contact information and instructions    Plan: The care management team will follow up with [name] by phone this  week.   [facility] / [place] Management  [phone]

## 2017-12-24 ENCOUNTER — Other Ambulatory Visit: Payer: Self-pay

## 2017-12-24 MED ORDER — BASAGLAR KWIKPEN 100 UNIT/ML ~~LOC~~ SOPN: 20.0000 [IU] | PEN_INJECTOR | Freq: Every day | SUBCUTANEOUS | 0 refills | Status: DC

## 2017-12-24 MED ORDER — INSULIN LISPRO 100 UNIT/ML (KWIKPEN): 4.0000 [IU] | PEN_INJECTOR | Freq: Two times a day (BID) | SUBCUTANEOUS | 0 refills | Status: DC

## 2017-12-24 NOTE — Telephone Encounter (Signed)
Copied from [place] [id number]. Topic: General - Other >> Dec 23, 2017  3:55 PM Ivar Drape wrote: Reason for CRM:   Sam w/[facility] [phone] stated the patient is expecting two insulin prescriptions to be sent to him to fill.  She said one is called Humalog, but she can't remember the other one.

## 2017-12-26 ENCOUNTER — Ambulatory Visit (INDEPENDENT_AMBULATORY_CARE_PROVIDER_SITE_OTHER): Payer: Medicare PPO | Admitting: Pharmacist

## 2017-12-26 DIAGNOSIS — E119 Type 2 diabetes mellitus without complications: Secondary | ICD-10-CM | POA: Diagnosis not present

## 2017-12-26 DIAGNOSIS — I5032 Chronic diastolic (congestive) heart failure: Secondary | ICD-10-CM | POA: Diagnosis not present

## 2017-12-26 DIAGNOSIS — Z794 Long term (current) use of insulin: Secondary | ICD-10-CM | POA: Diagnosis not present

## 2017-12-26 DIAGNOSIS — M1A4710 Other secondary chronic gout, right ankle and foot, without tophus (tophi): Secondary | ICD-10-CM

## 2017-12-26 NOTE — Chronic Care Management (AMB) (Signed)
  Chronic Care Management   Follow Up Note   12/26/2017 Name: Tricia [name] MRN: [id number] DOB: 08-05-1960  Referred by: [name], MD Reason for referral : Chronic Care Management (follow up CHF education/cough)    Subjective: "Things are really tough right now. I am having to take care of my boyfriend who just had his leg amputated and I am not doing what I should"  Assessment: 57 year old female primary care patient of Dr. [name] referred to case management team for assistance with chronic health conditions including DM, HLD, & Chronic Diastolic Heart Failure as well as medication management and assistance.   Goals Addressed    . "I guess I need to get better control of my diabetes" (pt-stated)       Patient states she has not been able to check her blood sugars related to not having a glucometer that uses the strips sent to her by [facility]. She states she has "5 boxes" of Acucheck strips however they are too big for her glucometer.  Clinical Goals: Over the next 7 days, patient will report understanding of plan of care related to receipt of new glucometer.  Interventions: Collaborated with CCM clinic pharmacist to facilitate prescription for new glucometer.    . COMPLETED: "I need to get some cough medicine for when my cough flares up" (pt-stated)       Patient states her cough is controlled at this time. She was encouraged to discuss intermittent cough with her pulmonologist for additional recommendations.    Marland Kitchen "I want to make sure I'm managing my heart failure right" (pt-stated)       Patient is not consistently weighing herself not monitoring her intake of sodium. 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 Patient admits to self care neglect related to assisting boyfriend with multiple MD appointments. She understands fluid retention and takes her lasix (as prescribed PRN) when she has additional swelling  in her lower legs and ankles. She denies receipt of [place] which was previously mailed as a resource to purchase a BP monitor. She understands she may have OTC benefits through [facility] that would allow her to "purchase" a BP monitor for free.  Clinical Goals: Over then next 7 days, patient will report understanding plan of care related to HF self monitoring/self care  Interventions: HF education provided including importance of daily weights, reporting weight gain of 3lb over night or 5lb in a week to provider, and following a low NA diet. Will resend [employer] via mail to patient for BP monitor.     . COMPLETED: "I want to see if I can keep my gout from flaring up" (pt-stated)       Patient's gout is controlled at this time. She continues to take her colchicine as directed. She has been provided additional education materials on how to better manage her gout through dietary choices.     Plan: Will follow up with patient in 1 week to ensure receipt of [employer] Catalogue and understanding of plan of care    [place] E. Rollene Rotunda, RN, BSN Nurse Care Coordinator [facility] / [facility] Care Management  [phone]

## 2017-12-27 ENCOUNTER — Other Ambulatory Visit: Payer: Self-pay | Admitting: Nurse Practitioner

## 2017-12-27 MED ORDER — BLOOD GLUCOSE MONITOR KIT: PACK | 0 refills | Status: DC

## 2017-12-27 NOTE — Chronic Care Management (AMB) (Signed)
Chronic Care Management   Note  12/27/2017 Name: Tricia [name] MRN: [id number] DOB: [birth year] Subjective:   Does the patient  feel that his/her medications are working for him/her?  Patient cannot afford her inhalers, theophylline, or insulin currently. She wants to get her diabetes and COPD under control. She is able to receive her generic medications for free via [facility] mail order.  Does the patient measure his/her own blood glucose at home?  no   Does the patient measure his/her own blood pressure at home? no   Does the patient have any problems obtaining medications due to transportation or finances?   yes  Understanding of regimen: poor Understanding of indications: fair Potential of compliance: poor  Objective  Medications Reviewed Today    Reviewed by [name], MD (Physician) on 10/29/17 at 1449  Med List Status: <None>  Medication Order Taking? Sig Documenting Provider Last Dose Status Informant  ALPRAZolam (XANAX) 0.5 MG tablet [id number] Yes Take 1 tablet by mouth 3 (three) times daily as needed. [name], MD Taking Active        Patient taking differently:       Discontinued 10/29/17 1423   budesonide-formoterol (SYMBICORT) 160-4.5 MCG/ACT inhaler [id number]  Inhale 2 puffs into the lungs 2 (two) times daily. [name], MD  Active   calcium carbonate (TUMS EX) 750 MG chewable tablet [id number] Yes Chew 2 tablets by mouth as needed for heartburn. [provider] Taking Active Self  cetirizine (ZYRTEC) 10 MG tablet [id number] Yes Take 10 mg by mouth as needed for allergies. [provider] Taking Active Self  colchicine (COLCRYS) 0.6 MG tablet [id number] Yes  [provider] Taking Active        Patient not taking:       Discontinued 10/29/17 1437 (Completed Course)         Discontinued 10/29/17 1437 (Change in therapy)   furosemide (LASIX) 40 MG tablet [id number] Yes Take 1 tablet (40 mg total) by mouth daily as needed.  [name], NP Taking Active   gabapentin (NEURONTIN) 600 MG tablet [id number] Yes Take 1 tablet (600 mg total) by mouth 3 (three) times daily. [name], NP Taking Active   glucose blood test strip [id number] Yes Use as directed to check blood glucose daily [name], NP Taking Active         Discontinued 10/29/17 1439 (Change in therapy)         Discontinued 10/29/17 1438 (Change in therapy)   ipratropium-albuterol (DUONEB) 0.5-2.5 (3) MG/3ML SOLN [id number] Yes Inhale 3 mLs into the lungs every 6 (six) hours as needed. [name], NP Taking Active        Patient not taking:       Discontinued 10/29/17 1438 (Patient Discharge)         Discontinued 10/29/17 1438 (Change in therapy)   montelukast (SINGULAIR) 10 MG tablet [id number] Yes Take 1 tablet (10 mg total) by mouth daily. afternoon [name], [name], NP Taking Active   morphine (MS CONTIN) 15 MG 12 hr tablet [id number] Yes Take 1 tablet by mouth 2 (two) times daily. [name], MD Taking Active Self  naproxen (NAPROSYN) 500 MG tablet [id number] Yes  [provider] Taking Active   oxyCODONE (ROXICODONE) 15 MG immediate release tablet [id number] Yes Take 15 mg by mouth every 6 (six) hours as needed for pain. [name], MD Taking Active Self        Discontinued 10/29/17 1440 (Completed Course)  Patient not taking:       Discontinued 10/29/17 1440 (Completed Course)   promethazine (PHENERGAN) 25 MG tablet [id number] Yes TAKE 1 TABLET BY MOUTH EVERY 8 HOURS AS NEEDED [name], [name], NP Taking Active   ranitidine (ZANTAC) 150 MG tablet [id number] Yes Take 1 tablet by mouth 2 (two) times daily as needed.  [provider] Taking Active   rizatriptan (MAXALT) 5 MG tablet [id number] Yes Take 5 mg by mouth as needed. [name], MD Taking Active            Med Note Candie Chroman Aug 16, 2014  4:11 PM) Medication taken as needed. 1 tab po at onset of symptoms,  repeat x1 in 2 hours if necessary, not to exceed 30mg  in 24 hrs. Received from: [facility]        Discontinued 10/29/17 1444 (Reorder)   rosuvastatin (CRESTOR) 5 MG tablet [id number] Yes Take 1 tablet (5 mg total) by mouth at bedtime. [name], MD  Active   theophylline (UNIPHYL) 400 MG 24 hr tablet [id number] Yes daily. Erby Pian, MD Taking Active Self           Med Note Nyoka Cowden, TEMEKA L   Thu May 05, 2015  3:20 PM) Received from: External Pharmacy  tiotropium [facility] HANDIHALER) 18 MCG inhalation capsule [id number] Yes Place into inhaler and inhale daily. pm [provider] Taking Active Self           Med Note Candie Chroman Aug 16, 2014  4:11 PM) Received from: [facility]  tiZANidine (ZANAFLEX) 4 MG tablet [id number] Yes Take 1 tablet by mouth 4 (four) times daily. [name], MD Taking Active Self  topiramate (TOPAMAX) 100 MG tablet [id number] Yes Take 1 tablet by mouth 2 (two) times daily. [name], MD Taking Active Self        Discontinued 10/29/17 1444 (Reorder)   traZODone (DESYREL) 50 MG tablet [id number] Yes Take 1 tablet (50 mg total) by mouth at bedtime. [name], MD  Active        Patient not taking:       Discontinued 10/29/17 1440 (Completed Course)         Discontinued 10/29/17 1444 (Reorder)   venlafaxine XR (EFFEXOR-XR) 75 MG 24 hr capsule [id number]  Take 1 capsule (75 mg total) by mouth daily with breakfast. [name], MD  Active   VENTOLIN HFA 108 (90 Base) MCG/ACT inhaler [id number] Yes INHALE 1 PUFF BY MOUTH AS NEEDED [name], [name], NP Taking Active            Assessment:    Intervention  YES NO  Explanation  Needs additional therapy      Medication requires monitoring [x]  []  Theophylline requires monitoring. Patient has been out of this medication for 2 months; Contacted Dr. Renee Harder.   Preventative therapy   []  [x]    Possible untreated condition   []  [x]      Synergistic therapy   []  [x]     Adherence      Cannot afford medication [x]  []     Cannot self-administer medication appropriately []  [x]     Does not understand directions [x]  []  Counseled patient on using nebulizer (Duoneb) while she is out of inhalers in order to improve her breathing and prevent infection.    Prefers not to take []  [x]     Product unavailable []  [x]     Forgets to take []  [x]   Other pertinent pharmacist  counseling    Provided [employer] OTC catalog for patient's OTC benefit - to order an at home BP cuff; Verbal collaboration with Clyde Lundborg to send in prescription for Accuchek Aviva plus glucometer (preferred brand, patient already has strips)   Goals Addressed            This Visit's Progress   . "I guess I need to get better control of my diabetes" (pt-stated)        Clinical Goals: Over the next 7 days, patient will report understanding of plan of care related to receipt of new glucometer.  Interventions: Collaborated with CCM clinic pharmacist to facilitate prescription for new glucometer.    Marland Kitchen "I need help straightening out my medicine" (pt-stated)       Clinical Goal(s):   Over the next 24 hours, patient will call [employer] @ [phone] to discuss eligibility for Basaglar/Humalog and will return a call to the case management team to notify of approval/disapproval  Over the next 7 days, patient will verbalize understanding of resources for medication needs    Interventions: Provided [employer] patient assistance contact information and instructions.     . COMPLETED: "I need to get some cough medicine for when my cough flares up" (pt-stated)       Patient states her cough is controlled at this time. She was encouraged to discuss intermittent cough with her pulmonologist for additional recommendations.    Marland Kitchen "I want to make sure I'm managing my heart failure right" (pt-stated)        Clinical Goals: Over then next 7 days, patient will  report understanding plan of care related to HF self monitoring/self care  Interventions: HF education provided including importance of daily weights, reporting weight gain of 3lb over night or 5lb in a week to provider, and following a low NA diet. Will resend patient [place] for BP monitor.     . COMPLETED: "I want to see if I can keep my gout from flaring up" (pt-stated)       Patient's gout is controlled at this time. She continues to take her colchicine as directed. She has been provided additional education materials on how to better manage her gout through dietary choices.    . COMPLETED: Reduce sugar intake to X grams per day       Reduce or eliminate sweets and carbs from diet       Time:  Time spent counseling patient: 42 Additional time spent on charting: 30 (including care coordination calls)  Plan: Recommendations discussed with provider - Add Accucheck Aviva Plus glucometer - contacted pulmonologist Dr. Wallene Huh regarding patient's non adherence of inhalers and theophylline due to cost   Ruben Reason, PharmD Clinical Pharmacist [place] [phone]

## 2017-12-27 NOTE — Patient Instructions (Addendum)
1. Continue to weigh yourself daily and right down your weights. 2. Notify MD if you gain 3 lbs over night or 5 lbs in a week.  3. Take all medications as prescribed and do not miss doses (please let your physician or CCM team know if you run out of your medications or cannot afford your medicines) 4. Look for your [employer] Catalogue to arrive in the mail (call your CCM Team if you need assistance with ordering)  5. Call [place] @ [phone]  Goals Addressed    . "I guess I need to get better control of my diabetes" (pt-stated)        Clinical Goals: Over the next 7 days, patient will report understanding of plan of care related to receipt of new glucometer.  Interventions: Collaborated with CCM clinic pharmacist to facilitate prescription for new glucometer.    Marland Kitchen "I need help straightening out my medicine" (pt-stated)       Clinical Goal(s):   Over the next 24 hours, patient will call [employer] @ [phone] to discuss eligibility for Basaglar/Humalog and will return a call to the case management team to notify of approval/disapproval  Over the next 7 days, patient will verbalize understanding of resources for medication needs  Interventions: Provided [employer] patient assistance contact information and instructions.     Marland Kitchen "I want to make sure I'm managing my heart failure right" (pt-stated)        Clinical Goals: Over then next 7 days, patient will report understanding plan of care related to HF self monitoring/self care  Interventions: HF education provided including importance of daily weights, reporting weight gain of 3lb over night or 5lb in a week to provider, and following a low NA diet. Will resend patient [place] for BP monitor.      CCM (Chronic Care Management) Team   [name] Fountain RN, BSN Nurse Care Coordinator  [phone]  Ruben Reason PharmD  Clinical Pharmacist  [phone]   The patient verbalized understanding  of instructions provided today and declined a print copy of patient instruction materials.

## 2018-01-02 ENCOUNTER — Ambulatory Visit: Payer: Self-pay | Admitting: Pharmacist

## 2018-01-02 ENCOUNTER — Telehealth: Payer: Self-pay

## 2018-01-02 NOTE — Chronic Care Management (AMB) (Signed)
  Chronic Care Management   Note  01/02/2018 Name: Tricia [name] MRN: [id number] DOB: 05/21/1960  57 y.o. year old female referred to Chronic Care Management by Dr. [name] for support and coordination for multiple chronic and social health conditions. Chronic conditions include DM, CHF, asthma  Last office visit with [name], MD was 10/29/17.   Was unable to reach patient via telephone today and have left HIPAA compliant voicemail asking patient to return my call. (unsuccessful outreach #1).   Outreach placed to patient today to follow up on: glucometer, [place], and appointment with Dr. Raul Del (pulmnologist)  Plan: Will follow-up within 3-5  business days via telephone.   Ruben Reason, PharmD Clinical Pharmacist [facility] [phone]

## 2018-01-07 ENCOUNTER — Other Ambulatory Visit: Payer: Self-pay | Admitting: Family Medicine

## 2018-01-07 DIAGNOSIS — G4700 Insomnia, unspecified: Secondary | ICD-10-CM

## 2018-01-09 ENCOUNTER — Telehealth: Payer: Self-pay

## 2018-01-09 ENCOUNTER — Ambulatory Visit: Payer: Self-pay | Admitting: Pharmacist

## 2018-01-09 NOTE — Chronic Care Management (AMB) (Signed)
  Chronic Care Management   Note  01/09/2018 Name: Tricia [name] MRN: [id number] DOB: 07/21/1960   57 y.o. year old female referred to Chronic Care Management by Dr. [name] for support and coordination for multiple chronic and social health conditions. Chronic conditions include DM, CHF, asthma  Last office visit with [name], MD was 10/29/17.   Was unable to reach patient via telephone today and have left HIPAA compliant voicemail asking patient to return my call. (unsuccessful outreach #2).   Outreach placed to patient today to follow up on: glucometer, [place], and appointment with Dr. Raul Del (pulmnologist)  Plan: Will follow-up within 3-5  business days via telephone.   Ruben Reason, PharmD Clinical Pharmacist [facility] [phone]

## 2018-01-14 ENCOUNTER — Other Ambulatory Visit: Payer: Self-pay | Admitting: Nurse Practitioner

## 2018-01-14 ENCOUNTER — Telehealth: Payer: Self-pay

## 2018-01-14 ENCOUNTER — Ambulatory Visit: Payer: Self-pay | Admitting: Pharmacist

## 2018-01-14 ENCOUNTER — Ambulatory Visit: Payer: Self-pay

## 2018-01-14 DIAGNOSIS — J411 Mucopurulent chronic bronchitis: Secondary | ICD-10-CM

## 2018-01-14 NOTE — Chronic Care Management (AMB) (Signed)
  Chronic Care Management   Note  01/14/2018 Name: Tricia [name] MRN: [id number] DOB: 1960/06/16    57 y.o. year old female referred to Chronic Care Management by Dr. [name] for support and coordination for multiple chronic health and social health conditions. Chronic conditions include DM, CHF, asthma.   Was unable to reach patient via telephone today and have left HIPAA compliant voicemail asking patient to return my call. (unsuccessful outreach #3).  Plan: Will follow-up within 3-5  business days via telephone.   Ruben Reason, PharmD Clinical Pharmacist [facility] [phone]

## 2018-01-17 ENCOUNTER — Ambulatory Visit: Payer: Self-pay

## 2018-01-17 ENCOUNTER — Telehealth: Payer: Self-pay

## 2018-01-17 NOTE — Chronic Care Management (AMB) (Signed)
  Chronic Care Management   Note  01/17/2018 Name: Tricia [name] MRN: [id number] DOB: 11-Jul-1960   57 y.o. year old female referred to Chronic Care Management by Dr. [name] for support and coordination for multiple chronic health and social health conditions. Chronic conditions include DM, CHF, asthma.   Was unable to reach patient via telephone today and have left HIPAA compliant voicemail asking patient to return my call. (unsuccessful outreach #3).  Plan: Will follow up with patient on Dec 5  At 1:30during her Annual Wellness Visit.    Maxey Ransom E. Rollene Rotunda, RN, BSN Nurse Care Coordinator [facility] Care Management  [phone]

## 2018-01-20 DIAGNOSIS — M2578 Osteophyte, vertebrae: Secondary | ICD-10-CM | POA: Diagnosis not present

## 2018-01-20 DIAGNOSIS — Z720 Tobacco use: Secondary | ICD-10-CM | POA: Diagnosis not present

## 2018-01-20 DIAGNOSIS — Z716 Tobacco abuse counseling: Secondary | ICD-10-CM | POA: Diagnosis not present

## 2018-01-20 DIAGNOSIS — F17200 Nicotine dependence, unspecified, uncomplicated: Secondary | ICD-10-CM | POA: Diagnosis not present

## 2018-01-20 DIAGNOSIS — M545 Low back pain: Secondary | ICD-10-CM | POA: Diagnosis not present

## 2018-01-20 DIAGNOSIS — Z87891 Personal history of nicotine dependence: Secondary | ICD-10-CM | POA: Diagnosis not present

## 2018-01-20 DIAGNOSIS — Z79891 Long term (current) use of opiate analgesic: Secondary | ICD-10-CM | POA: Diagnosis not present

## 2018-01-20 DIAGNOSIS — G894 Chronic pain syndrome: Secondary | ICD-10-CM | POA: Diagnosis not present

## 2018-01-20 DIAGNOSIS — M5430 Sciatica, unspecified side: Secondary | ICD-10-CM | POA: Diagnosis not present

## 2018-01-23 ENCOUNTER — Ambulatory Visit: Payer: Self-pay

## 2018-01-23 ENCOUNTER — Ambulatory Visit: Payer: Medicare PPO

## 2018-01-23 VITALS — BP 98/58 | HR 112 | Temp 97.4°F | Resp 17 | Ht 69.0 in | Wt 164.8 lb

## 2018-01-23 DIAGNOSIS — Z599 Problem related to housing and economic circumstances, unspecified: Secondary | ICD-10-CM

## 2018-01-23 DIAGNOSIS — Z0000 Encounter for general adult medical examination without abnormal findings: Secondary | ICD-10-CM

## 2018-01-23 DIAGNOSIS — F419 Anxiety disorder, unspecified: Secondary | ICD-10-CM | POA: Diagnosis not present

## 2018-01-23 DIAGNOSIS — F32A Depression, unspecified: Secondary | ICD-10-CM

## 2018-01-23 DIAGNOSIS — F329 Major depressive disorder, single episode, unspecified: Secondary | ICD-10-CM | POA: Diagnosis not present

## 2018-01-23 DIAGNOSIS — Z598 Other problems related to housing and economic circumstances: Secondary | ICD-10-CM

## 2018-01-23 DIAGNOSIS — Z1159 Encounter for screening for other viral diseases: Secondary | ICD-10-CM | POA: Diagnosis not present

## 2018-01-23 DIAGNOSIS — E119 Type 2 diabetes mellitus without complications: Secondary | ICD-10-CM

## 2018-01-23 DIAGNOSIS — I5032 Chronic diastolic (congestive) heart failure: Secondary | ICD-10-CM

## 2018-01-23 DIAGNOSIS — Z794 Long term (current) use of insulin: Secondary | ICD-10-CM | POA: Diagnosis not present

## 2018-01-23 DIAGNOSIS — F331 Major depressive disorder, recurrent, moderate: Secondary | ICD-10-CM

## 2018-01-23 NOTE — Patient Instructions (Addendum)
1. Please go by the pharmacy and pick up your blood glucose meter. 2. Check your blood sugars daily and record 3. Please consider ordering a BP monitor and scales  from your [employer] over the counter booklet 4. Monitor the swelling in your legs, hands, stomach, and feet and notify Dr. [name] if you have a significant amount of swelling. 5. Continue to take all your medications as prescribed and please notify Dr. [name] or the CCM Team if you are unable to afford your medications. 6. Contact your CCM Team as needed   CCM (Chronic Care Management) Team   [name] Fountain RN, BSN Nurse Care Coordinator  [phone]  Ruben Reason PharmD  Clinical Pharmacist  [phone]  Goals Addressed    . "I guess I need to get better control of my diabetes" (pt-stated)   Not on track     Clinical Goals: Over the next 7 days, patient will report understanding of plan of care related to receipt of new glucometer.  Interventions: Collaborated with CCM clinic pharmacist to facilitate prescription for new glucometer. Patient encouraged to pick up new glucometer which has been ordered by pharmacist.    . "I want to make sure I'm managing my heart failure right" (pt-stated)   Not on track     Clinical Goals: Over then next 7 days, patient will report understanding plan of care related to HF self monitoring/self care  Interventions: HF education reinforced including importance of daily weights, reporting weight gain of 3lb over night or 5lb in a week to provider, and following a low NA diet.     The patient verbalized understanding of instructions provided today and declined a print copy of patient instruction materials.

## 2018-01-23 NOTE — Chronic Care Management (AMB) (Signed)
  Chronic Care Management   Follow Up Note   01/23/2018 Name: Tricia [name] MRN: [id number] DOB: 1961/02/09  Referred by: [name], MD Reason for referral : Chronic Care Management (face to face follow up)    Subjective: "I'm doing good I guess"   Objective:  Lab Results  Component Value Date   HGBA1C 14.0 (A) 10/29/2017   BP Readings from Last 3 Encounters:  01/23/18 (!) 98/58  11/05/17 104/68  10/29/17 (!) 88/58     Assessment: 57 y.o.year old femalereferred to Chronic Care Management by Dr. Ladon Applebaum support and coordination for multiple chronic health and social health conditions. Chronic conditions include DM, CHF, asthma. The CCM Team has had difficulty reaching Tricia [name] by phone and follow up with her today in the office after her Annual Wellness Visit. Tricia [name] no longer has a cell phone and states the best number to reach her is her home phone [phone].  Goals Addressed    . "I guess I need to get better control of my diabetes" (pt-stated)   Not on track    Tricia [name] has not obtained her glucometer from the pharmacy as requested by the CCM Team several weeks ago. It is discussed today that her meter will be "free" with her insurance and if strips are needed they will also be covered. Tricia [name] agrees to pick up her glucometer ASAP.  Clinical Goals: Over the next 7 days, patient will report understanding of plan of care related to receipt of new glucometer.  Interventions: Collaborated with CCM clinic pharmacist to facilitate prescription for new glucometer. Patient encouraged to pick up new glucometer which has been ordered by pharmacist.    . "I want to make sure I'm managing my heart failure right" (pt-stated)   Not on track    Patient continue to admit to smoking however she has "cut back" to 3/4 pack daily. She is not weighing herself daily but states her boyfriend has "fluid scales that tell you how much fluid you have.  They are not normal scales". She has nonpitting lower extremity edema today and is only able to tie her shoes loosely. She denies increased shortness of breath. She verbalizes compliance with her fluid pills. Tricia [name] has been provided with the [facility] OTC booklet and verbalizes understanding on how to order her "free" BP monitor.  Clinical Goals: Over then next 7 days, patient will report understanding plan of care related to HF self monitoring/self care  Interventions: HF education reinforced including importance of daily weights, reporting weight gain of 3lb over night or 5lb in a week to provider, and following a low NA diet. Encouraged smoking cessation and offered counseling.     Plan: Will follow up with patient in 1 week    Tricia [name] E. Rollene Rotunda, RN, BSN Nurse Care Coordinator [facility] Care Management  [phone]

## 2018-01-23 NOTE — Addendum Note (Signed)
Addended by: Clemetine Marker D on: 01/23/2018 03:01 PM   Modules accepted: Orders, SmartSet

## 2018-01-23 NOTE — Progress Notes (Addendum)
Subjective:   Tricia [name] is a 57 y.o. female who presents for Medicare Annual (Subsequent) preventive examination.  Review of Systems:   Cardiac Risk Factors include: diabetes mellitus;dyslipidemia;hypertension;smoking/ tobacco exposure     Objective:     Vitals: BP (!) 98/58 (BP Location: Left Arm, Patient Position: Sitting, Cuff Size: Normal)   Pulse (!) 112   Temp (!) 97.4 F (36.3 C) (Oral)   Resp 17   Ht _0  (1.753 m)   Wt 164 lb 12.8 oz (74.8 kg)   BMI 24.34 kg/m   Body mass index is 24.34 kg/m.  Advanced Directives 01/23/2018 04/25/2017 01/01/2017 12/19/2016 09/18/2016 06/19/2016 05/05/2015  Does Patient Have a Medical Advance Directive? _1  No No  Would patient like information on creating a medical advance directive? Yes (MAU/Ambulatory/Procedural Areas - Information given) No - Patient declined Yes (MAU/Ambulatory/Procedural Areas - Information given) - - - No - patient declined information    Tobacco Social History   Tobacco Use  Smoking Status Current Every Day Smoker  . Packs/day: 0.75  . Years: 40.00  . Pack years: 30.00  . Types: Cigarettes  Smokeless Tobacco Never Used     Ready to quit: Not Answered Counseling given: Not Answered   Clinical Intake:  Pre-visit preparation completed: Yes  Pain : 0-10 Pain Score: 9  Pain Type: Chronic pain Pain Location: Back(also right arm and knees) Pain Orientation: Lower Pain Descriptors / Indicators: Aching Pain Onset: More than a month ago Pain Relieving Factors: pain medication  Pain Relieving Factors: pain medication  Nutritional Risks: Unintentional weight loss Diabetes: Yes CBG done?: No Did pt. bring in CBG monitor from home?: No  How often do you need to have someone help you when you read instructions, pamphlets, or other written materials from your doctor or pharmacy?: 1 - Never What is the last grade level you completed in school?: associates degree  college  Interpreter Needed?: No  Information entered by :: Clemetine Marker LPN  Past Medical History:  Diagnosis Date  . Anxiety   . Arthritis    joints and hands/ knees  . Asthma    uses inhaler  . Benign essential tremor    head  . Cervical dystonia    neck pain  . Cholesteatoma of left ear    x2  . COPD (chronic obstructive pulmonary disease) ([place])   . Cough   . Depression   . Diabetes mellitus without complication ([place])    type 2  . Diastolic dysfunction   . Dyspnea   . Dysrhythmia    diastolic dysfunction  . GERD (gastroesophageal reflux disease)   . Headache    migraines/ one per week  . 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 Hyperlipidemia   . Hypertension   . Motion sickness    boat  . Neuromuscular disorder ([facility])    neuropathy feet and hands( nerve damage)  . Wears dentures    upper and lower   Past Surgical History:  Procedure Laterality Date  . CARPAL TUNNEL RELEASE Bilateral    x2 right, 1x on left  . COLONOSCOPY    . COLONOSCOPY WITH PROPOFOL N/A 04/25/2017   Procedure: COLONOSCOPY WITH PROPOFOL;  Surgeon: [name], MD;  Location: [facility];  Service: Endoscopy;  Laterality: N/A;  diabetic-oral med  . DILATION AND CURETTAGE OF UTERUS    . EXTERNAL EAR SURGERY Left    x2  . POLYPECTOMY  04/25/2017   Procedure: POLYPECTOMY INTESTINAL;  Surgeon: [name], MD;  Location: [facility];  Service: Endoscopy;;  . SPINE SURGERY     herniated disc  . TUBAL LIGATION     Family History  Problem Relation Age of Onset  . Emphysema Mother   . Stroke Father   . Throat cancer Father   . Multiple sclerosis Daughter   . Bipolar disorder Daughter   . Cervical cancer Daughter   . Bipolar disorder Daughter    Social History   Socioeconomic History  . Marital status: Divorced    Spouse name: Not on file  . Number of children: 2  . Years of education: Not on file  . Highest education level: Associate degree: academic  program  Occupational History  . Occupation: Disability  Social Needs  . Financial resource strain: Very hard  . Food insecurity:    Worry: Sometimes true    Inability: Sometimes true  . Transportation needs:    Medical: No    Non-medical: No  Tobacco Use  . Smoking status: Current Every Day Smoker    Packs/day: 0.75    Years: 40.00    Pack years: 30.00    Types: Cigarettes  . Smokeless tobacco: Never Used  Substance and Sexual Activity  . Alcohol use: No    Alcohol/week: 0.0 standard drinks  . Drug use: No  . Sexual activity: Not Currently  Lifestyle  . Physical activity:    Days per week: 0 days    Minutes per session: 0 min  . Stress: Very much  Relationships  . Social connections:    Talks on phone: Three times a week    Gets together: Once a week    Attends religious service: Never    Active member of club or organization: No    Attends meetings of clubs or organizations: Never    Relationship status: Divorced  Other Topics Concern  . Not on file  Social History Narrative   Lives with her boyfriend, she has two daughters. Her youngest daughter  has multiple medical problems - used to do drugs and is now very sick, oldest lives in [place] still an alcoholic and drug addict.  Pt's boyfriend had leg amputation and she has to care for him.     Outpatient Encounter Medications as of 01/23/2018  Medication Sig  . ALPRAZolam (XANAX) 0.5 MG tablet Take 1 tablet by mouth 3 (three) times daily as needed.  . blood glucose meter kit and supplies KIT Dispense accuchek aviva plus; can change if needed e11.9  . calcium carbonate (TUMS EX) 750 MG chewable tablet Chew 2 tablets by mouth as needed for heartburn.  . cetirizine (ZYRTEC) 10 MG tablet Take 10 mg by mouth as needed for allergies.  Marland Kitchen dextromethorphan (DELSYM) 30 MG/5ML liquid Take by mouth.  . gabapentin (NEURONTIN) 600 MG tablet Take 1 tablet (600 mg total) by mouth 3 (three) times daily.  Marland Kitchen glucose blood test strip Use as  directed to check blood glucose daily  . Insulin Glargine (BASAGLAR KWIKPEN) 100 UNIT/ML SOPN Inject 0.2-0.5 mLs (20-50 Units total) into the skin daily.  . insulin lispro (HUMALOG) 100 UNIT/ML KiwkPen Inject 0.04-0.1 mLs (4-10 Units total) into the skin 2 (two) times daily before lunch and supper.  . Insulin Pen Needle 32G X 6 MM MISC 2 each by Does not apply route daily.  Marland Kitchen ipratropium-albuterol (DUONEB) 0.5-2.5 (3) MG/3ML SOLN Inhale 3 mLs into the lungs every 6 (six) hours as  needed.  . metFORMIN (GLUCOPHAGE-XR) 750 MG 24 hr tablet Take 1 tablet (750 mg total) by mouth daily with breakfast.  . montelukast (SINGULAIR) 10 MG tablet Take 1 tablet (10 mg total) by mouth daily. afternoon  . morphine (MS CONTIN) 15 MG 12 hr tablet Take 1 tablet by mouth 2 (two) times daily.  . naproxen (NAPROSYN) 500 MG tablet   . oxyCODONE (ROXICODONE) 15 MG immediate release tablet Take 15 mg by mouth every 6 (six) hours as needed for pain.  . promethazine (PHENERGAN) 25 MG tablet TAKE 1 TABLET BY MOUTH EVERY 8 HOURS AS NEEDED  . ranitidine (ZANTAC) 150 MG tablet Take 1 tablet by mouth 2 (two) times daily as needed.   . rizatriptan (MAXALT) 5 MG tablet Take 5 mg by mouth as needed.  . rosuvastatin (CRESTOR) 5 MG tablet Take 1 tablet (5 mg total) by mouth at bedtime.  . theophylline (UNIPHYL) 400 MG 24 hr tablet Take by mouth.  Marland Kitchen tiZANidine (ZANAFLEX) 4 MG tablet Take 1 tablet by mouth 4 (four) times daily.  Marland Kitchen topiramate (TOPAMAX) 100 MG tablet Take 1 tablet by mouth 2 (two) times daily.  . traZODone (DESYREL) 50 MG tablet TAKE 1 TABLET BY MOUTH AT BEDTIME  . venlafaxine XR (EFFEXOR-XR) 150 MG 24 hr capsule Take 1 capsule (150 mg total) by mouth daily with breakfast.  . VENTOLIN HFA 108 (90 Base) MCG/ACT inhaler INHALE 1 PUFF BY MOUTH AS NEEDED  . budesonide-formoterol (SYMBICORT) 160-4.5 MCG/ACT inhaler Inhale 2 puffs into the lungs 2 (two) times daily. (Patient not taking: Reported on 01/23/2018)  . colchicine  (COLCRYS) 0.6 MG tablet   . furosemide (LASIX) 40 MG tablet Take 1 tablet (40 mg total) by mouth daily as needed. (Patient not taking: Reported on 01/23/2018)  . tiotropium (SPIRIVA HANDIHALER) 18 MCG inhalation capsule Place into inhaler and inhale daily. pm  . [DISCONTINUED] theophylline (UNIPHYL) 400 MG 24 hr tablet daily.  . [DISCONTINUED] Vitamin D, Ergocalciferol, (DRISDOL) 50000 units CAPS capsule Take 1 capsule (50,000 Units total) by mouth every 7 (seven) days.   No facility-administered encounter medications on file as of 01/23/2018.     Activities of Daily Living In your present state of health, do you have any difficulty performing the following activities: 01/23/2018 11/05/2017  Hearing? Y N  Comment partially deaf in left ear -  Vision? Y -  Comment pt states she needs new glasses and needs eye appt -  Difficulty concentrating or making decisions? Y N  Comment concentrating -  Walking or climbing stairs? Y N  Comment pain in knees, goes slowly -  Dressing or bathing? N N  Doing errands, shopping? N N  Preparing Food and eating ? N N  Using the Toilet? N N  In the past six months, have you accidently leaked urine? Y -  Comment wears pads for protection -  Do you have problems with loss of bowel control? N -  Managing your Medications? N N  Managing your Finances? N Y  Comment - related to medication costs  Housekeeping or managing your Housekeeping? N N  Some recent data might be hidden    Patient Care Team: [name], MD as PCP - General (Family Medicine) Erby Pian, MD as Consulting Physician (Specialist) [name], MD as Consulting Physician (Neurology) [name], MD as Consulting Physician (Anesthesiology) Cathi Roan, [facility] as Pharmacist (Pharmacist) Benedetto Goad, RN as Case Manager    Assessment:   This is a  routine wellness examination for [place].  Exercise Activities and Dietary recommendations Current Exercise Habits: The  patient does not participate in regular exercise at present, Exercise limited by: orthopedic condition(s);respiratory conditions(s)  Goals    . "I guess I need to get better control of my diabetes" (pt-stated)      Clinical Goals: Over the next 7 days, patient will report understanding of plan of care related to receipt of new glucometer.  Interventions: Collaborated with [facility] clinic pharmacist to facilitate prescription for new glucometer.    Marland Kitchen "I need help straightening out my medicine" (pt-stated)     Clinical Goal(s):   Over the next 24 hours, patient will call [employer] @ [phone] to discuss eligibility for Basaglar/Humalog and will return a call to the case management team to notify of approval/disapproval  Over the next 7 days, patient will verbalize understanding of resources for medication needs    Interventions: Provided [employer] patient assistance contact information and instructions.     Marland Kitchen "I want to make sure I'm managing my heart failure right" (pt-stated)      Clinical Goals: Over then next 7 days, patient will report understanding plan of care related to HF self monitoring/self care  Interventions: HF education provided including importance of daily weights, reporting weight gain of 3lb over night or 5lb in a week to provider, and following a low NA diet. Will resend patient [place] for BP monitor.        Fall Risk Fall Risk  01/23/2018 11/05/2017 10/29/2017 06/21/2017 02/20/2017  Falls in the past year? 1 - Yes No Yes  Number falls in past yr: 1 - 2 or more - 2 or more  Injury with Fall? 0 - Yes - No  Comment - - - - -  Risk for fall due to : Impaired balance/gait History of fall(s);Medication side effect - - -  Risk for fall due to: Comment - - - - -  Follow up Falls prevention discussed - - - Falls evaluation completed   FALL RISK PREVENTION PERTAINING TO THE HOME:  Any stairs in or around the home WITH handrails? Yes - no railing  outside, pt plans for ramp to be installed. Home free of loose throw rugs in walkways, pet beds, electrical cords, etc? Yes  Adequate lighting in your home to reduce risk of falls? Yes   ASSISTIVE DEVICES UTILIZED TO PREVENT FALLS:  Life alert? No  Use of a cane, walker or w/c? Yes  Grab bars in the bathroom? No  Shower chair or bench in shower? Yes  Elevated toilet seat or a handicapped toilet? No   DME ORDERS:  DME order needed?  No   TIMED UP AND GO:  Was the test performed? Yes .  Length of time to ambulate 10 feet: 6 sec.   GAIT:  Appearance of gait: Gait stead-fast and without the use of an assistive device.   Education: Fall risk prevention has been discussed.  Intervention(s) required? No   Depression Screen PHQ 2/9 Scores 01/23/2018 10/29/2017 06/21/2017 02/20/2017  PHQ - 2 Score _0 PHQ- 9 Score 18 13 - 5     Cognitive Function     6CIT Screen 01/01/2017  What Year? 0 points  What month? 0 points  What time? 0 points  Count back from 20 0 points  Months in reverse 0 points  Repeat phrase 0 points  Total Score 0    Immunization History  Administered Date(s) Administered  .  Influenza,inj,Quad PF,6+ Mos 01/24/2015, 12/19/2016, 10/29/2017  . Influenza-Unspecified 12/09/2015  . Pneumococcal Polysaccharide-23 02/19/2009  . Tdap 12/09/2012    Qualifies for Shingles Vaccine? Yes . Due for Shingrix. Education has been provided regarding the importance of this vaccine. Pt has been advised to call insurance company to determine out of pocket expense. Advised may also receive vaccine at local pharmacy or Health Dept. Verbalized acceptance and understanding.  Tdap: Up to date  Flu Vaccine: Up to date   Pneumococcal Vaccine: Due age 63.  Screening Tests Health Maintenance  Topic Date Due  . HIV Screening  09/19/1975  . PAP SMEAR  09/18/1981  . OPHTHALMOLOGY EXAM  02/19/2018 (Originally 09/19/1970)  . MAMMOGRAM  03/22/2018 (Originally 09/19/1978)  .  Hepatitis C Screening  06/22/2018 (Originally [birth year])  . FOOT EXAM  02/20/2018  . URINE MICROALBUMIN  02/20/2018  . HEMOGLOBIN A1C  04/29/2018  . TETANUS/TDAP  12/10/2022  . COLONOSCOPY  04/26/2027  . INFLUENZA VACCINE  Completed  . PNEUMOCOCCAL POLYSACCHARIDE VACCINE AGE 45-64 HIGH RISK  Completed    Cancer Screenings:  Colorectal Screening: Completed 04/25/17. Repeat every 10 years;   Mammogram: Completed 11/09/2011. Repeat every year. Pt declines screening mammogram.   Lung Cancer Screening: (Low Dose CT Chest recommended if Age 47-80 years, 30 pack-year currently smoking OR have quit w/in 15years.) does qualify.   Pt declines lung cancer screening.   Additional Screening:  Hepatitis C Screening: does qualify; ordered today.   Vision Screening: Recommended annual ophthalmology exams for early detection of glaucoma and other disorders of the eye. Is the patient up to date with their annual eye exam?  No Who is the provider or what is the name of the office in which the pt attends annual eye exams? [facility] Screening: Recommended annual dental exams for proper oral hygiene  Community Resource Referral:  CRR required this visit?  Yes      Plan:    I have personally reviewed and addressed the Medicare Annual Wellness questionnaire and have noted the following in the patient's chart:  A. Medical and social history B. Use of alcohol, tobacco or illicit drugs  C. Current medications and supplements D. Functional ability and status E.  Nutritional status F.  Physical activity G. Advance directives H. List of other physicians I.  Hospitalizations, surgeries, and ER visits in previous 12 months J.  [place] such as hearing and vision if needed, cognitive and depression L. Referrals and appointments   In addition, I have reviewed and discussed with patient certain preventive protocols, quality metrics, and best practice recommendations. A written  personalized care plan for preventive services as well as general preventive health recommendations were provided to patient.   Signed,  Clemetine Marker, LPN Nurse Health Advisor   Nurse Notes: Pt c/o social and emotional stress with family, especially her youngest daughter who she claims is addicted to drugs and alcohol. She is feeling very anxious and tense even while currently taking alprazolam 0.5 mg 2-3 times per day and venlafaxine XR 114m. She is also having difficulty sleeping even with trazodone. She would like to try a different medication other than the alprazolam to help with her anxiety. Pt states "when my daughter comes around my anxiety is through the roof and I feel like I want to snap her neck" PHQ9 18 today.  She is also dealing with stress of boyfriend having leg amputation 2 months ago and [place]. Pt states she has not been checking her blood sugar due to not having a meter and most recent A1C in September was 14. Pt also seeing chronic care management today to discuss diabetes management and notify pharmacist about being unable to afford symbicort or spiriva. Referral sent to [facility] care team for community resources for counseling and food insecurities.   Addendum:  I am not prescribing alprazolam, I recommend referral to psychiatrist and sooner appointment with me  [name], MD

## 2018-01-23 NOTE — Patient Instructions (Addendum)
Tricia [name] , Thank you for taking time to come for your Medicare Wellness Visit. I appreciate your ongoing commitment to your health goals. Please review the following plan we discussed and let me know if I can assist you in the future.   Screening recommendations/referrals: Colonoscopy: done 04/25/17 repeat in 10 years.  Mammogram: postponed. Please call us if you would like to schedule a mammogram.  Recommended yearly ophthalmology/optometry visit for glaucoma screening and checkup Recommended yearly dental visit for hygiene and checkup  Vaccinations: Influenza vaccine: done 10/29/17 Pneumococcal vaccine: due at age 57 Tdap vaccine: done 12/09/12 Shingles vaccine: Shingrix discussed. Please contact your insurance company for coverage information.   Advanced directives: Advance directive discussed with you today. I have provided a copy for you to complete at home and have notarized. Once this is complete please bring a copy in to our office so we can scan it into your chart.  Conditions/risks identified: Recommend continuing to see care management for diabetes control.   If you wish to quit smoking, help is available. For free tobacco cessation program offerings call the [facility] at ([phone] or Live Well Line at 956-288-0207. You may also visit [email or web address] or email [email or web address] for more information on other programs.   Free hearing clinics offered in [facility]:   [facility] [place], [place], [place] [address] [phone]  [facility] of the [place], [place], [place] [address] [phone]  [place]:  [facility] [phone] www.alamanceservices.org Services Include: Housing counseling, home ownership, senior nutrition, asset development, emergency food, crisis information and referral, self sufficiency, and LIEAP  programs.  [place] on Wheels [phone] www.alamancemow.org Services Include: Home-delivered meals, frozen home-delivered meals, and winter emergency meals. (Note: Call for more information on additional supportive services.) 7 Category continues on next page. [employer] [phone]  www.alliedchurches.org Services Include: Job and education services, shelter, meals and emergency food. Weekday lunch and dinner meal program for shelter residents and community members (hot breakfast daily for shelter residents).  [employer] [phone]  www. RecruitSuit.co.za Services Include: Building surveyor. [facility] [employer] [phone] [email or web address]/bam/[email or web address] Services Include: WEEKEND Meals Saturdays 11:30 am - 12:30 pm and Sundays 12:30 - 1:30 pm.  Division of Social Services Special Nutrition Assistance Program (SNAP) Food Stamps Program [phone]  ([phone] https://gibson.com/- food-stamps Services Include: Nutrition supportive programs (Note: Food Stamps.)  [facility] [place], [place] We offer both emergency food assistance and also have a monthly food assistance program. Our monthly program signup day and times are: Monday 10:00am - 12:00pm; 7:00pm - 9:00pmFriday 10:00am - 12:00pmFood distribution is base on first come first serve basis Call if you have any questions.   [place] [phone]  www.hbcburlington.net Services Include: Food pantry Wednesdays 10 am - Noon.  [employer] [phone]  www.salvationarmy.org Services Include: Utility assistance, food pantry, life-sustaining medicine, and rental assistance (when funds are available     Next appointment: Please follow up in one year for your Medicare Annual Wellness visit.    Preventive Care 40-64 Years, Female Preventive care refers to  lifestyle choices and visits with your health care provider that can promote health and wellness. What does preventive care include?  A yearly physical exam. This is also called an annual well check.  Dental exams once or twice a year.  Routine eye exams. Ask  your health care provider how often you should have your eyes checked.  Personal lifestyle choices, including:  Daily care of your teeth and gums.  Regular physical activity.  Eating a healthy diet.  Avoiding tobacco and drug use.  Limiting alcohol use.  Practicing safe sex.  Taking low-dose aspirin daily starting at age 40.  Taking vitamin and mineral supplements as recommended by your health care provider. What happens during an annual well check? The services and screenings done by your health care provider during your annual well check will depend on your age, overall health, lifestyle risk factors, and family history of disease. Counseling  Your health care provider may ask you questions about your:  Alcohol use.  Tobacco use.  Drug use.  Emotional well-being.  Home and relationship well-being.  Sexual activity.  Eating habits.  Work and work Statistician.  Method of birth control.  Menstrual cycle.  Pregnancy history. Screening  You may have the following tests or measurements:  Height, weight, and BMI.  Blood pressure.  Lipid and cholesterol levels. These may be checked every 5 years, or more frequently if you are over 74 years old.  Skin check.  Lung cancer screening. You may have this screening every year starting at age 43 if you have a 30-pack-year history of smoking and currently smoke or have quit within the past 15 years.  Fecal occult blood test (FOBT) of the stool. You may have this test every year starting at age 52.  Flexible sigmoidoscopy or colonoscopy. You may have a sigmoidoscopy every 5 years or a colonoscopy every 10 years starting at age 55.  Hepatitis C blood  test.  Hepatitis B blood test.  Sexually transmitted disease (STD) testing.  Diabetes screening. This is done by checking your blood sugar (glucose) after you have not eaten for a while (fasting). You may have this done every 1-3 years.  Mammogram. This may be done every 1-2 years. Talk to your health care provider about when you should start having regular mammograms. This may depend on whether you have a family history of breast cancer.  BRCA-related cancer screening. This may be done if you have a family history of breast, ovarian, tubal, or peritoneal cancers.  Pelvic exam and Pap test. This may be done every 3 years starting at age 66. Starting at age 39, this may be done every 5 years if you have a Pap test in combination with an HPV test.  Bone density scan. This is done to screen for osteoporosis. You may have this scan if you are at high risk for osteoporosis. Discuss your test results, treatment options, and if necessary, the need for more tests with your health care provider. Vaccines  Your health care provider may recommend certain vaccines, such as:  Influenza vaccine. This is recommended every year.  Tetanus, diphtheria, and acellular pertussis (Tdap, Td) vaccine. You may need a Td booster every 10 years.  Zoster vaccine. You may need this after age 24.  Pneumococcal 13-valent conjugate (PCV13) vaccine. You may need this if you have certain conditions and were not previously vaccinated.  Pneumococcal polysaccharide (PPSV23) vaccine. You may need one or two doses if you smoke cigarettes or if you have certain conditions. Talk to your health care provider about which screenings and vaccines you need and how often you need them. This information is not intended to replace advice given to you by your health care provider. Make sure you discuss any questions you have with your health  care provider. Document Released: 03/04/2015 Document Revised: 10/26/2015 Document Reviewed:  12/07/2014 Elsevier Interactive Patient Education  2017 [place] Prevention in the Home Falls can cause injuries. They can happen to people of all ages. There are many things you can do to make your home safe and to help prevent falls. What can I do on the outside of my home?  Regularly fix the edges of walkways and driveways and fix any cracks.  Remove anything that might make you trip as you walk through a door, such as a raised step or threshold.  Trim any bushes or trees on the path to your home.  Use bright outdoor lighting.  Clear any walking paths of anything that might make someone trip, such as rocks or tools.  Regularly check to see if handrails are loose or broken. Make sure that both sides of any steps have handrails.  Any raised decks and porches should have guardrails on the edges.  Have any leaves, snow, or ice cleared regularly.  Use sand or salt on walking paths during winter.  Clean up any spills in your garage right away. This includes oil or grease spills. What can I do in the bathroom?  Use night lights.  Install grab bars by the toilet and in the tub and shower. Do not use towel bars as grab bars.  Use non-skid mats or decals in the tub or shower.  If you need to sit down in the shower, use a plastic, non-slip stool.  Keep the floor dry. Clean up any water that spills on the floor as soon as it happens.  Remove soap buildup in the tub or shower regularly.  Attach bath mats securely with double-sided non-slip rug tape.  Do not have throw rugs and other things on the floor that can make you trip. What can I do in the bedroom?  Use night lights.  Make sure that you have a light by your bed that is easy to reach.  Do not use any sheets or blankets that are too big for your bed. They should not hang down onto the floor.  Have a firm chair that has side arms. You can use this for support while you get dressed.  Do not have throw  rugs and other things on the floor that can make you trip. What can I do in the kitchen?  Clean up any spills right away.  Avoid walking on wet floors.  Keep items that you use a lot in easy-to-reach places.  If you need to reach something above you, use a strong step stool that has a grab bar.  Keep electrical cords out of the way.  Do not use floor polish or wax that makes floors slippery. If you must use wax, use non-skid floor wax.  Do not have throw rugs and other things on the floor that can make you trip. What can I do with my stairs?  Do not leave any items on the stairs.  Make sure that there are handrails on both sides of the stairs and use them. Fix handrails that are broken or loose. Make sure that handrails are as long as the stairways.  Check any carpeting to make sure that it is firmly attached to the stairs. Fix any carpet that is loose or worn.  Avoid having throw rugs at the top or bottom of the stairs. If you do have throw rugs, attach them to the floor with carpet tape.  Make sure that you have a light switch at the top of the stairs and the bottom of the stairs. If you do not have them, ask someone to add them for you. What else can I do to help prevent falls?  Wear shoes that:  Do not have high heels.  Have rubber bottoms.  Are comfortable and fit you well.  Are closed at the toe. Do not wear sandals.  If you use a stepladder:  Make sure that it is fully opened. Do not climb a closed stepladder.  Make sure that both sides of the stepladder are locked into place.  Ask someone to hold it for you, if possible.  Clearly mark and make sure that you can see:  Any grab bars or handrails.  First and last steps.  Where the edge of each step is.  Use tools that help you move around (mobility aids) if they are needed. These include:  Canes.  Walkers.  Scooters.  Crutches.  Turn on the lights when you go into a dark area. Replace any light  bulbs as soon as they burn out.  Set up your furniture so you have a clear path. Avoid moving your furniture around.  If any of your floors are uneven, fix them.  If there are any pets around you, be aware of where they are.  Review your medicines with your doctor. Some medicines can make you feel dizzy. This can increase your chance of falling. Ask your doctor what other things that you can do to help prevent falls. This information is not intended to replace advice given to you by your health care provider. Make sure you discuss any questions you have with your health care provider. Document Released: 12/02/2008 Document Revised: 07/14/2015 Document Reviewed: 03/12/2014 Elsevier Interactive Patient Education  2017 [employer].

## 2018-01-24 NOTE — Chronic Care Management (AMB) (Signed)
Chronic Care Management   Follow Up Note   01/24/2018 Name: Tricia [name] MRN: [id number] DOB: 04-22-1960  Referred by: [name], MD Reason for referral : Chronic Care Management (face to face follow up)   Subjective: "I don't know, I'm doing ok"   Objective: Medications Reviewed Today    Reviewed by Cathi Roan, [facility] (Pharmacist) on 01/23/18 at 1512  Med List Status: <None>  Medication Order Taking? Sig Documenting Provider Last Dose Status Informant  ALPRAZolam (XANAX) 0.5 MG tablet [id number] Yes Take 1 tablet by mouth 3 (three) times daily as needed. [name], MD Taking Active Self  blood glucose meter kit and supplies KIT [id number] Yes Dispense accuchek aviva plus; can change if needed e11.9 [name], NP Taking Active Self  budesonide-formoterol (SYMBICORT) 160-4.5 MCG/ACT inhaler [id number] No Inhale 2 puffs into the lungs 2 (two) times daily.  Patient not taking:  Reported on 01/23/2018   [name], MD Not Taking Active Self  calcium carbonate (TUMS EX) 750 MG chewable tablet [id number] Yes Chew 2 tablets by mouth as needed for heartburn. [provider] Taking Active Self  cetirizine (ZYRTEC) 10 MG tablet [id number] Yes Take 10 mg by mouth as needed for allergies. [provider] Taking Active Self  colchicine (COLCRYS) 0.6 MG tablet [id number] No  [provider] Not Taking Active Self           Med Note [name], Marvelle Span E   Thu Dec 26, 2017  4:41 PM) Old prescription; copay is $150  dextromethorphan (DELSYM) 30 MG/5ML liquid [id number] No Take by mouth. [provider] Not Taking Active Self  furosemide (LASIX) 40 MG tablet [id number] No Take 1 tablet (40 mg total) by mouth daily as needed.  Patient not taking:  Reported on 01/23/2018   [name], NP Not Taking Active Self           Med Note [name], Garnetta Buddy Dec 26, 2017  4:42 PM)  "not urinating" (3-4x daily)  gabapentin (NEURONTIN) 600 MG  tablet [id number] Yes Take 1 tablet (600 mg total) by mouth 3 (three) times daily. [name], NP Taking Active Self  glucose blood test strip [id number] No Use as directed to check blood glucose daily  Patient not taking:  Reported on 01/23/2018   [name], NP Not Taking Active Self  Insulin Glargine [facility] KWIKPEN) 100 UNIT/ML SOPN [id number] Yes Inject 0.2-0.5 mLs (20-50 Units total) into the skin daily. [name], MD Taking Active Self           Med Note Cathi Roan   Thu Jan 23, 2018  3:06 PM) 30unit  insulin lispro (HUMALOG) 100 UNIT/ML KiwkPen [id number] Yes Inject 0.04-0.1 mLs (4-10 Units total) into the skin 2 (two) times daily before lunch and supper. [name], MD Taking Active Self           Med Note [name], [name]   Thu Jan 23, 2018  3:06 PM) 10un before meals- 1 or 2 meals per day  Insulin Pen Needle 32G X 6 MM MISC [id number] Yes 2 each by Does not apply route daily. [name], MD Taking Active Self  ipratropium-albuterol (DUONEB) 0.5-2.5 (3) MG/3ML SOLN [id number] Yes Inhale 3 mLs into the lungs every 6 (six) hours as needed. [name], NP Taking Active Self  metFORMIN (GLUCOPHAGE-XR) 750 MG 24 hr tablet [id number] Yes Take 1 tablet (750 mg total) by mouth daily with breakfast. [name], MD  Taking Active Self  montelukast (SINGULAIR) 10 MG tablet [id number] Yes Take 1 tablet (10 mg total) by mouth daily. afternoon [name], [name], NP Taking Active Self  morphine (MS CONTIN) 15 MG 12 hr tablet [id number] Yes Take 1 tablet by mouth 2 (two) times daily. [name], MD Taking Active Self  naproxen (NAPROSYN) 500 MG tablet [id number] Yes  [provider] Taking Active Self  oxyCODONE (ROXICODONE) 15 MG immediate release tablet [id number] Yes Take 15 mg by mouth every 6 (six) hours as needed for pain. [name], MD Taking Active Self  promethazine (PHENERGAN) 25 MG tablet [id number] Yes TAKE 1 TABLET BY MOUTH EVERY 8  HOURS AS NEEDED [name], [name], NP Taking Active Self  rizatriptan (MAXALT) 5 MG tablet [id number] Yes Take 5 mg by mouth as needed. [name], MD Taking Active Self           Med Note Candie Chroman Aug 16, 2014  4:11 PM) Medication taken as needed. 1 tab po at onset of symptoms, repeat x1 in 2 hours if necessary, not to exceed 53m in 24 hrs. Received from: [facility] rosuvastatin (CRESTOR) 5 MG tablet [id number] Take 1 tablet (5 mg total) by mouth at bedtime. SSteele Sizer MD Taking Active Self  theophylline (UNIPHYL) 400 MG 24 hr tablet [id number] Take by mouth. [provider] Taking Active Self  tiotropium (SPIRIVA HANDIHALER) 18 MCG inhalation capsule [id number] Place into inhaler and inhale daily. pm [provider] Not Taking Active Self           Med Note (Roland Rack  Mon Aug 16, 2014  4:11 PM) Received from: [facility] tiZANidine (ZANAFLEX) 4 MG tablet [id number] Take 1 tablet by mouth 4 (four) times daily. DLeone Payor MD Taking Active Self  topiramate (TOPAMAX) 100 MG tablet [id number] Take 1 tablet by mouth 2 (two) times daily. SVladimir Crofts MD Taking Active Self  traZODone (DESYREL) 50 MG tablet [id number] TAKE 1 TABLET BY MOUTH AT BEDTIME SSteele Sizer MD Taking Active Self  venlafaxine XR (EFFEXOR-XR) 150 MG 24 hr capsule [id number] Take 1 capsule (150 mg total) by mouth daily with breakfast. SSteele Sizer MD Taking Active Self  VENTOLIN HFA 108 (90 Base) MCG/ACT inhaler [id number] INHALE 1 PUFF BY MOUTH AS NEEDED SSteele Sizer MD Taking Active Self            Assessment: 57[facility] failed telephone outreach attempts. Patient reports that her telephone number has changed (updated in chart). She is out of all her inhalers except Ventolin. She is using her nebulizer. She does have her theophylline.  Patient reports she has not picked up the glucometer that was sent in for her on 12/26/17 and hasn't been checking her blood sugars because currently her strips and meter do not match. She received her [employer] OTC catalog in the mail but hasn't ordered anything from it.   Goals Addressed            This Visit's Progress   . "I guess I need to get better control of my diabetes" (pt-stated)   Not on track     Clinical Goals:  Over the next 7 days, patient will report understanding of plan of care related to receipt of new glucometer.  Interventions:  01/23/18:Collaborated with [facility] pharmacist to facilitate prescription for new glucometer.  Patient encouraged to pick up new glucometer which has been ordered by pharmacist.    . "I want to make sure I'm managing my heart failure right" (pt-stated)   Not on track     Clinical Goals: Over then next 7 days, patient will report understanding plan of care related to HF self monitoring/self care  Interventions: HF education reinforced including importance of daily weights, reporting weight gain of 3lb over night or 5lb in a week to provider, and following a low NA diet.       Plan:   -Contacted pharmacy to ensure they had current RX for AccuChek Aviva Plus Glucometer -Patient is to call [facility] pharmacist if the "boxes of strips" she has at home are NOT AccuChek Aviva strips as she first reported, so that [facility] pharmacist can arrange for a testing strip prescription to be sent in  Follow up in 1 week.    Ruben Reason, PharmD Clinical Pharmacist [facility] [phone]

## 2018-01-28 ENCOUNTER — Telehealth: Payer: Self-pay

## 2018-01-28 NOTE — Telephone Encounter (Signed)
01/28/18 Attempted to call both patient and boyfriend's phone numbers both have been disconnected.  Unable to contact patient re: community [email or web address]

## 2018-01-30 ENCOUNTER — Ambulatory Visit: Payer: Medicare PPO | Admitting: Family Medicine

## 2018-01-30 ENCOUNTER — Encounter: Payer: Self-pay | Admitting: Family Medicine

## 2018-01-30 ENCOUNTER — Ambulatory Visit
Admission: RE | Admit: 2018-01-30 | Discharge: 2018-01-30 | Disposition: A | Discharge status: Home / Self Care | Payer: Medicare PPO | Source: Ambulatory Visit | Attending: Family Medicine | Admitting: Family Medicine

## 2018-01-30 ENCOUNTER — Ambulatory Visit
Admission: RE | Admit: 2018-01-30 | Discharge: 2018-01-30 | Disposition: A | Discharge status: Home / Self Care | Payer: Medicare PPO | Attending: Family Medicine | Admitting: Family Medicine

## 2018-01-30 VITALS — BP 82/64 | HR 83 | Temp 98.2°F | Resp 18 | Ht 69.0 in | Wt 164.4 lb

## 2018-01-30 DIAGNOSIS — F17200 Nicotine dependence, unspecified, uncomplicated: Secondary | ICD-10-CM

## 2018-01-30 DIAGNOSIS — I5032 Chronic diastolic (congestive) heart failure: Secondary | ICD-10-CM | POA: Diagnosis not present

## 2018-01-30 DIAGNOSIS — E1142 Type 2 diabetes mellitus with diabetic polyneuropathy: Secondary | ICD-10-CM | POA: Diagnosis not present

## 2018-01-30 DIAGNOSIS — J4541 Moderate persistent asthma with (acute) exacerbation: Secondary | ICD-10-CM | POA: Diagnosis not present

## 2018-01-30 DIAGNOSIS — F331 Major depressive disorder, recurrent, moderate: Secondary | ICD-10-CM | POA: Diagnosis not present

## 2018-01-30 DIAGNOSIS — R05 Cough: Secondary | ICD-10-CM | POA: Diagnosis not present

## 2018-01-30 MED ORDER — HYDROXYZINE HCL 10 MG PO TABS: 10.0000 mg | ORAL_TABLET | Freq: Three times a day (TID) | ORAL | 0 refills | Status: DC | PRN

## 2018-01-30 MED ORDER — VENLAFAXINE HCL ER 150 MG PO CP24: 150.0000 mg | ORAL_CAPSULE | Freq: Every day | ORAL | 1 refills | Status: DC

## 2018-01-30 MED ORDER — PREDNISONE 20 MG PO TABS: 20.0000 mg | ORAL_TABLET | Freq: Two times a day (BID) | ORAL | 0 refills | Status: DC

## 2018-01-30 MED ORDER — DOXYCYCLINE HYCLATE 100 MG PO TABS: 100.0000 mg | ORAL_TABLET | Freq: Two times a day (BID) | ORAL | 0 refills | Status: AC

## 2018-01-30 NOTE — Patient Instructions (Signed)
Please go across the street for Chest Xray

## 2018-01-30 NOTE — Progress Notes (Signed)
Name: Tricia [name]   MRN: [id number]    DOB: 1960/12/09   Date:01/30/2018       Progress Note  Subjective  Chief Complaint  Chief Complaint  Patient presents with  . Medication Management  . Anxiety    HPI  Asthma and COPD: Under the care of Dr. Raul Del. Still has daily cough and intermittent SOB. She states she uses Ventolin 1-2 times daily but has been out of Symbicort due to cost - she has been working with CCM team to obtain these, but has not yet.  Ambulatory sats are dropping into the mid-80's, recovers into the high 90's when she stops walking/at rest.  She is feeling like she has very low energy - says this may be related to her depression and anxiety.  She notes new onset bronchitis over the last week - shortness of breath is at baseline, but cough is a bit worse - productive, green phlegm.  She has taken prednisone in the past and it has not altered her mood, however she had very high A1C at last visit; states azithromycin has not worked for her in the past. She is current smoker, not ready to quit.  Major Depression: chronic and recurrent, PHQ9 Score of 19.  She has a lot of social stress - she is trying to take care of her granddaughter who has mental health issues and is currently admitted to the adolescent unit at [facility] - pt states her granddaughter has been sexually active, using drugs and alcohol. The pt feels that her daughter has turned her back on her granddaughter; she notes she often verbally fights with her daughter.  She is taking 129m Effexor daily.  PT notes that her pain management doctor recently decreased her Xanax to 0.550mBID dosing (initially she was on 53m24mID) and this has caused her anxiety to increase as well.  She describes thoughts of harming her daughter when they fight, however she verbalizes that she is able to restrain herself and states she will not harm her daughter. We will add hydroxyzine today and refill effexor.  CHF/Hypotension:  She takes lasix prn only, has BLE swelling, she has orthopnea, but she states from post-nasal drainage. She used to see Dr. AriFletcher Anont requested someone else - referral was placed for Dr. FatUbaldo Glassing last visit, but she is unable to go until after the first of the year due to finances.  She had hypotension in September 2019 when here to see Dr. SowAncil Boozerild hypotension during her Medicare visit on 01/23/2018, and is hypotensive again today - she states this has been ongoing for her - she endorses lightheadedness and intermittent shortness of breath - both more so with exertion and both at baseline today.  Denies chest pain or dizziness today; no recent near-syncopal episodes.  DMII: She has T2DM that has been poorly controlled and with foot ulcer. Discussed how this can contribute to more complex infections. We will schedule close follow up for her hypotension and for DM follow up (tomorrow).  Denies polydipsia, polyphagia, or polyuria.  We will hold off on prednisone use at this time.  Patient Active Problem List   Diagnosis Date Noted  . Benign neoplasm of descending colon   . Polyp of sigmoid colon   . Steroid-induced diabetes ([place]CC[place]1/08/2017  . Polyneuropathy 10/21/2014  . Chronic venous insufficiency 10/05/2014  . Bilateral leg edema 08/16/2014  . Major depression in partial remission ([place]CC[place]6/27/2016  . Acid reflux 08/16/2014  .  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 Asthma, moderate persistent 08/16/2014  . Carpal tunnel syndrome 08/16/2014  . Cervical pain 08/16/2014  . CAFL (chronic airflow limitation) ([place]) 08/16/2014  . Type 2 diabetes mellitus with peripheral neuropathy ([place]) 08/16/2014  . Diabetes mellitus type 2, insulin dependent ([place]) 08/16/2014  . Dyslipidemia 08/16/2014  . Current smoker 08/16/2014  . Essential (primary) hypertension 08/16/2014  . Benign neoplasm of stomach 08/16/2014  . Gout 08/16/2014  . HLD (hyperlipidemia) 08/16/2014  . Low back pain 08/16/2014  .  Lumbar radiculopathy 08/16/2014  . Headache, migraine 08/16/2014  . Arthralgia of multiple joints 08/16/2014  . Avitaminosis D 08/16/2014  . Primary osteoarthritis of both knees 06/22/2014  . Benign essential tremor 10/02/2013  . Cervical dystonia 10/02/2013  . Chronic diastolic heart failure ([place]) 11/16/2012  . Chronic pain 11/13/2012    Past Surgical History:  Procedure Laterality Date  . CARPAL TUNNEL RELEASE Bilateral    x2 right, 1x on left  . COLONOSCOPY    . COLONOSCOPY WITH PROPOFOL N/A 04/25/2017   Procedure: COLONOSCOPY WITH PROPOFOL;  Surgeon: [name], MD;  Location: [facility];  Service: Endoscopy;  Laterality: N/A;  diabetic-oral med  . DILATION AND CURETTAGE OF UTERUS    . EXTERNAL EAR SURGERY Left    x2  . POLYPECTOMY  04/25/2017   Procedure: POLYPECTOMY INTESTINAL;  Surgeon: [name], MD;  Location: [facility];  Service: Endoscopy;;  . SPINE SURGERY     herniated disc  . TUBAL LIGATION      Family History  Problem Relation Age of Onset  . Emphysema Mother   . Stroke Father   . Throat cancer Father   . Multiple sclerosis Daughter   . Bipolar disorder Daughter   . Cervical cancer Daughter   . Bipolar disorder Daughter     Social History   Socioeconomic History  . Marital status: Divorced    Spouse name: Not on file  . Number of children: 2  . Years of education: Not on file  . Highest education level: Associate degree: academic program  Occupational History  . Occupation: Disability  Social Needs  . Financial resource strain: Very hard  . Food insecurity:    Worry: Sometimes true    Inability: Sometimes true  . Transportation needs:    Medical: No    Non-medical: No  Tobacco Use  . Smoking status: Current Every Day Smoker    Packs/day: 0.75    Years: 40.00    Pack years: 30.00    Types: Cigarettes  . Smokeless tobacco: Never Used  Substance and Sexual Activity  . Alcohol use: No    Alcohol/week: 0.0 standard drinks   . Drug use: No  . Sexual activity: Not Currently  Lifestyle  . Physical activity:    Days per week: 0 days    Minutes per session: 0 min  . Stress: Very much  Relationships  . Social connections:    Talks on phone: Three times a week    Gets together: Once a week    Attends religious service: Never    Active member of club or organization: No    Attends meetings of clubs or organizations: Never    Relationship status: Divorced  . Intimate partner violence:    Fear of current or ex partner: No    Emotionally abused: No    Physically abused: No    Forced sexual activity: No  Other Topics Concern  . Not on file  Social History Narrative   Lives with her boyfriend, she has two daughters. Her youngest daughter  has multiple medical problems - used to do drugs and is now very sick, oldest lives in [place] still an alcoholic and drug addict.  Pt's boyfriend had leg amputation and she has to care for him.       Granddaughter is being transferred to [facility] ( a long-term care facility) and could be there for 6 months to a year.     Current Outpatient Medications:  .  ALPRAZolam (XANAX) 0.5 MG tablet, Take 1 tablet by mouth 3 (three) times daily as needed., Disp: , Rfl:  .  blood glucose meter kit and supplies KIT, Dispense accuchek aviva plus; can change if needed e11.9, Disp: 1 each, Rfl: 0 .  budesonide-formoterol (SYMBICORT) 160-4.5 MCG/ACT inhaler, Inhale 2 puffs into the lungs 2 (two) times daily. (Patient not taking: Reported on 01/23/2018), Disp: 1 Inhaler, Rfl: 0 .  calcium carbonate (TUMS EX) 750 MG chewable tablet, Chew 2 tablets by mouth as needed for heartburn., Disp: , Rfl:  .  cetirizine (ZYRTEC) 10 MG tablet, Take 10 mg by mouth as needed for allergies., Disp: , Rfl:  .  colchicine (COLCRYS) 0.6 MG tablet, , Disp: , Rfl:  .  dextromethorphan (DELSYM) 30 MG/5ML liquid, Take by mouth., Disp: , Rfl:  .  furosemide (LASIX) 40 MG tablet, Take 1 tablet (40 mg total) by mouth  daily as needed. (Patient not taking: Reported on 01/23/2018), Disp: 30 tablet, Rfl: 2 .  gabapentin (NEURONTIN) 600 MG tablet, Take 1 tablet (600 mg total) by mouth 3 (three) times daily., Disp: 90 tablet, Rfl: 3 .  glucose blood test strip, Use as directed to check blood glucose daily (Patient not taking: Reported on 01/23/2018), Disp: 100 each, Rfl: 2 .  Insulin Glargine (BASAGLAR KWIKPEN) 100 UNIT/ML SOPN, Inject 0.2-0.5 mLs (20-50 Units total) into the skin daily., Disp: 15 mL, Rfl: 0 .  insulin lispro (HUMALOG) 100 UNIT/ML KiwkPen, Inject 0.04-0.1 mLs (4-10 Units total) into the skin 2 (two) times daily before lunch and supper., Disp: 15 mL, Rfl: 0 .  Insulin Pen Needle 32G X 6 MM MISC, 2 each by Does not apply route daily., Disp: 200 each, Rfl: 2 .  ipratropium-albuterol (DUONEB) 0.5-2.5 (3) MG/3ML SOLN, Inhale 3 mLs into the lungs every 6 (six) hours as needed., Disp: 360 mL, Rfl: 3 .  metFORMIN (GLUCOPHAGE-XR) 750 MG 24 hr tablet, Take 1 tablet (750 mg total) by mouth daily with breakfast., Disp: 90 tablet, Rfl: 1 .  montelukast (SINGULAIR) 10 MG tablet, Take 1 tablet (10 mg total) by mouth daily. afternoon, Disp: 90 tablet, Rfl: 0 .  morphine (MS CONTIN) 15 MG 12 hr tablet, Take 1 tablet by mouth 2 (two) times daily., Disp: , Rfl:  .  naproxen (NAPROSYN) 500 MG tablet, , Disp: , Rfl:  .  oxyCODONE (ROXICODONE) 15 MG immediate release tablet, Take 15 mg by mouth every 6 (six) hours as needed for pain., Disp: , Rfl:  .  promethazine (PHENERGAN) 25 MG tablet, TAKE 1 TABLET BY MOUTH EVERY 8 HOURS AS NEEDED, Disp: 20 tablet, Rfl: 0 .  rizatriptan (MAXALT) 5 MG tablet, Take 5 mg by mouth as needed., Disp: , Rfl:  .  rosuvastatin (CRESTOR) 5 MG tablet, Take 1 tablet (5 mg total) by mouth at bedtime., Disp: 90 tablet, Rfl: 1 .  theophylline (UNIPHYL) 400 MG 24 hr tablet, Take by mouth., Disp: , Rfl:  .  tiotropium (SPIRIVA HANDIHALER) 18 MCG inhalation capsule, Place into inhaler and inhale daily.  pm, Disp: , Rfl:  .  tiZANidine (ZANAFLEX) 4 MG tablet, Take 1 tablet by mouth 4 (four) times daily., Disp: , Rfl:  .  topiramate (TOPAMAX) 100 MG tablet, Take 1 tablet by mouth 2 (two) times daily., Disp: , Rfl:  .  traZODone (DESYREL) 50 MG tablet, TAKE 1 TABLET BY MOUTH AT BEDTIME, Disp: 30 tablet, Rfl: 0 .  venlafaxine XR (EFFEXOR-XR) 150 MG 24 hr capsule, Take 1 capsule (150 mg total) by mouth daily with breakfast., Disp: 90 capsule, Rfl: 1 .  VENTOLIN HFA 108 (90 Base) MCG/ACT inhaler, INHALE 1 PUFF BY MOUTH AS NEEDED, Disp: 18 g, Rfl: 0  Allergies  Allergen Reactions  . Augmentin [Amoxicillin-Pot Clavulanate] Diarrhea  . Penicillins Itching    I personally reviewed active problem list, medication list, allergies, notes from last encounter, lab results with the patient/caregiver today.   ROS Ten systems reviewed and is negative except as mentioned in HPI.  Objective  Vitals:   01/30/18 1140 01/30/18 1141  BP: (!) 86/58 (!) 82/64  Pulse: 83   Resp: 18   Temp: 98.2 F (36.8 C)   TempSrc: Oral   SpO2: 98%   Weight: 164 lb 6.4 oz (74.6 kg)   Height: _0  (1.753 m)    Body mass index is 24.28 kg/m.  Physical Exam Vitals signs and nursing note reviewed.  Constitutional:      Appearance: She is well-developed.  HENT:     Head: Normocephalic and atraumatic.     Right Ear: Tympanic membrane and external ear normal.     Left Ear: Tympanic membrane and external ear normal.     Nose: Congestion present.     Mouth/Throat:     Mouth: Mucous membranes are moist.     Pharynx: Oropharynx is clear.     Tonsils: No tonsillar exudate.  Eyes:     General: No scleral icterus.    Conjunctiva/sclera: Conjunctivae normal.     Pupils: Pupils are equal, round, and reactive to light.  Neck:     Musculoskeletal: Normal range of motion and neck supple.  Cardiovascular:     Rate and Rhythm: Normal rate and regular rhythm.     Heart sounds: Normal heart sounds.  Pulmonary:      Effort: Pulmonary effort is normal.     Breath sounds: Wheezing (inspiratory and expiratory wheezes throughout, diminished in bilateral bases) present.  Musculoskeletal: Normal range of motion.        General: Swelling (+2 pitting edema to BLE) present.  Skin:    General: Skin is warm and dry.     Capillary Refill: Capillary refill takes 2 to 3 seconds.     Findings: No erythema or rash.  Neurological:     Mental Status: She is alert and oriented to person, place, and time.     Cranial Nerves: No cranial nerve deficit.  Psychiatric:        Behavior: Behavior normal.        Thought Content: Thought content normal.        Judgment: Judgment normal.     Comments: Anxious mood    No results found for this or any previous visit (from the past 72 hour(s)).  PHQ2/9: Depression screen [facility] 2/9 01/23/2018 10/29/2017 06/21/2017 02/20/2017 01/01/2017  Decreased Interest 0 1 0 0 0  Down, Depressed, Hopeless _1 PHQ - 2 Score 3 2  _0 Altered sleeping 3 3 - 1 3  Tired, decreased energy 3 3 - 1 3  Change in appetite 3 2 - 1 2  Feeling bad or failure about yourself  3 1 - 0 3  Trouble concentrating 3 2 - 1 3  Moving slowly or fidgety/restless 0 0 - 0 0  Suicidal thoughts 0 0 - 0 0  PHQ-9 Score 18 13 - 5 17  Difficult doing work/chores - Somewhat difficult - Not difficult at all Very difficult   Fall Risk: Fall Risk  01/30/2018 01/23/2018 11/05/2017 10/29/2017 06/21/2017  Falls in the past year? 1 1 - Yes No  Number falls in past yr: 1 1 - 2 or more -  Injury with Fall? 1 0 - Yes -  Comment - - - - -  Risk for fall due to : - Impaired balance/gait History of fall(s);Medication side effect - -  Risk for fall due to: Comment - - - - -  Follow up - Falls prevention discussed - - -   Assessment & Plan  1. Moderate recurrent major depression (HCC) - HydrOXYzine (ATARAX/VISTARIL) 10 MG tablet; Take 1 tablet (10 mg total) by mouth every 8 (eight) hours as needed for anxiety.  Dispense: 20  tablet; Refill: 0 - venlafaxine XR (EFFEXOR-XR) 150 MG 24 hr capsule; Take 1 capsule (150 mg total) by mouth daily with breakfast.  Dispense: 90 capsule; Refill: 1 - Advised may take Xanax as prescribed; use hydroxyzine PRN  2. Moderate persistent asthma with acute exacerbation - doxycycline (VIBRA-TABS) 100 MG tablet; Take 1 tablet (100 mg total) by mouth 2 (two) times daily for 10 days.  Dispense: 20 tablet; Refill: 0 - DG Chest 2 View; Future - will forward to Dr. Raul Del; patient has been having financial issues and does not have access to maintenance inhalers - is working with chronic care management team.  3. Chronic diastolic heart failure ([place]) - Needs to see cardiology; taking lasix PRN.  4. Current smoker - Not ready to quit - cessation discussed  5. Type 2 diabetes mellitus with peripheral neuropathy (HCC) - Discussed that this can contribute to complex infections; we will hold off on prednisone at this time, and she will return tomorrow for DM and BP check

## 2018-01-31 ENCOUNTER — Ambulatory Visit: Payer: Medicare PPO | Admitting: Nurse Practitioner

## 2018-02-04 ENCOUNTER — Ambulatory Visit (INDEPENDENT_AMBULATORY_CARE_PROVIDER_SITE_OTHER): Payer: Medicare PPO | Admitting: Pharmacist

## 2018-02-04 DIAGNOSIS — F331 Major depressive disorder, recurrent, moderate: Secondary | ICD-10-CM | POA: Diagnosis not present

## 2018-02-04 DIAGNOSIS — J4541 Moderate persistent asthma with (acute) exacerbation: Secondary | ICD-10-CM

## 2018-02-04 DIAGNOSIS — F17200 Nicotine dependence, unspecified, uncomplicated: Secondary | ICD-10-CM

## 2018-02-04 DIAGNOSIS — I5032 Chronic diastolic (congestive) heart failure: Secondary | ICD-10-CM | POA: Diagnosis not present

## 2018-02-04 DIAGNOSIS — E1142 Type 2 diabetes mellitus with diabetic polyneuropathy: Secondary | ICD-10-CM | POA: Diagnosis not present

## 2018-02-04 NOTE — Patient Instructions (Signed)
The patient verbalized understanding of instructions provided today and declined a print copy of patient instruction materials.   

## 2018-02-04 NOTE — Chronic Care Management (AMB) (Signed)
Chronic Care Management   Follow Up Note   02/04/2018 Name: Tricia [name] MRN: [id number] DOB: 1960/06/17  Referred by: [name], MD Reason for referral : Chronic Care Management (Follow up)  Subjective: "I don't feel good today, my back is hurting"   Objective: Medications Reviewed Today    Reviewed by [name], [name] (Family Nurse Practitioner) on 01/30/18 at 57  Med List Status: <None>  Medication Order Taking? Sig Documenting Provider Last Dose Status Informant  ALPRAZolam (XANAX) 0.5 MG tablet [id number]  Take 1 tablet by mouth 3 (three) times daily as needed. [name], MD  Active Self  blood glucose meter kit and supplies KIT [id number]  Dispense accuchek aviva plus; can change if needed e11.9 [name], NP  Active Self  budesonide-formoterol (SYMBICORT) 160-4.5 MCG/ACT inhaler [id number]  Inhale 2 puffs into the lungs 2 (two) times daily.  Patient not taking:  Reported on 01/23/2018   [name], MD  Active Self  calcium carbonate (TUMS EX) 750 MG chewable tablet [id number]  Chew 2 tablets by mouth as needed for heartburn. [provider]  Active Self  cetirizine (ZYRTEC) 10 MG tablet [id number]  Take 10 mg by mouth as needed for allergies. [provider]  Active Self  colchicine (COLCRYS) 0.6 MG tablet [id number]   [provider]  Active Self           Med Note Clelia Croft Dec 26, 2017  4:41 PM) Old prescription; copay is $150  dextromethorphan (DELSYM) 30 MG/5ML liquid [id number]  Take by mouth. [provider]  Active Self  doxycycline (VIBRA-TABS) 100 MG tablet [id number]  Take 1 tablet (100 mg total) by mouth 2 (two) times daily for 10 days. [name], [name]  Active   furosemide (LASIX) 40 MG tablet [id number] Yes Take 1 tablet (40 mg total) by mouth daily as needed. [name], NP  Active Self           Med Note [name], Garnetta Buddy Dec 26, 2017  4:42 PM)  "not urinating" (3-4x  daily)  gabapentin (NEURONTIN) 600 MG tablet [id number]  Take 1 tablet (600 mg total) by mouth 3 (three) times daily. [name], NP  Active Self  glucose blood test strip [id number]  Use as directed to check blood glucose daily  Patient not taking:  Reported on 01/23/2018   [name], NP  Active Self  hydrOXYzine (ATARAX/VISTARIL) 10 MG tablet [id number]  Take 1 tablet (10 mg total) by mouth every 8 (eight) hours as needed for anxiety. [name], [name]  Active   Insulin Glargine [facility] KWIKPEN) 100 UNIT/ML SOPN [id number]  Inject 0.2-0.5 mLs (20-50 Units total) into the skin daily. [name], MD  Active Self           Med Note Cathi Roan   Thu Jan 23, 2018  3:06 PM) 30unit  insulin lispro (HUMALOG) 100 UNIT/ML KiwkPen [id number]  Inject 0.04-0.1 mLs (4-10 Units total) into the skin 2 (two) times daily before lunch and supper. [name], MD  Active Self           Med Note [name], [name]   Thu Jan 23, 2018  3:06 PM) 10un before meals- 1 or 2 meals per day  Insulin Pen Needle 32G X 6 MM MISC [id number]  2 each by Does not apply route daily. [name], MD  Active Self  ipratropium-albuterol (DUONEB) 0.5-2.5 (3)  MG/3ML SOLN [id number]  Inhale 3 mLs into the lungs every 6 (six) hours as needed. [name], NP  Active Self  metFORMIN (GLUCOPHAGE-XR) 750 MG 24 hr tablet [id number]  Take 1 tablet (750 mg total) by mouth daily with breakfast. [name], MD  Active Self  montelukast (SINGULAIR) 10 MG tablet [id number]  Take 1 tablet (10 mg total) by mouth daily. afternoon [name], [name], NP  Active Self  morphine (MS CONTIN) 15 MG 12 hr tablet [id number]  Take 1 tablet by mouth 2 (two) times daily. [name], MD  Active Self  naproxen (NAPROSYN) 500 MG tablet [id number]   [provider]  Active Self  oxyCODONE (ROXICODONE) 15 MG immediate release tablet [id number]  Take 15 mg by mouth every 6 (six) hours as needed for pain. [name], MD  Active Self  predniSONE (DELTASONE) 20 MG tablet [id number]  Take 1 tablet (20 mg total) by mouth 2 (two) times daily with a meal. [name], [name]  Active   promethazine (PHENERGAN) 25 MG tablet [id number]  TAKE 1 TABLET BY MOUTH EVERY 8 HOURS AS NEEDED [name], [name], NP  Active Self  rizatriptan (MAXALT) 5 MG tablet [id number]  Take 5 mg by mouth as needed. [name], MD  Active Self           Med Note Candie Chroman Aug 16, 2014  4:11 PM) Medication taken as needed. 1 tab po at onset of symptoms, repeat x1 in 2 hours if necessary, not to exceed 25m in 24 hrs. Received from: [facility] rosuvastatin (CRESTOR) 5 MG tablet [id number] Take 1 tablet (5 mg total) by mouth at bedtime. SSteele Sizer MD  Active Self  theophylline (UNIPHYL) 400 MG 24 hr tablet [id number] Take by mouth. [provider]  Active Self  tiotropium (SPIRIVA HANDIHALER) 18 MCG inhalation capsule [id number] Place into inhaler and inhale daily. pm [provider]  Active Self           Med Note (Candie ChromanJun 27, 2016  4:11 PM) Received from: [facility] tiZANidine (ZANAFLEX) 4 MG tablet [id number] Take 1 tablet by mouth 4 (four) times daily. DLeone Payor MD  Active Self  topiramate (TOPAMAX) 100 MG tablet [id number] Take 1 tablet by mouth 2 (two) times daily. SVladimir Crofts MD  Active Self  traZODone (DESYREL) 50 MG tablet [id number] TAKE 1 TABLET BY MOUTH AT BEDTIME SSteele Sizer MD  Active Self        Discontinued 01/30/18 1220 (Reorder)   venlafaxine XR (EFFEXOR-XR) 150 MG 24 hr capsule [id number] Take 1 capsule (150 mg total) by mouth daily with breakfast. BHubbard Hartshorn [name]  Active   VENTOLIN HFA 108 (90 Base) MCG/ACT inhaler [id number] INHALE 1 PUFF BY MOUTH AS NEEDED SSteele Sizer MD  Active Self           Med Note ([name] [place]   Thu Jan 23, 2018  3:12 PM) 2-3 times per day         Assessment:    Diabetes: Patient has picked up glucometer from pharmacy. It does match the strips she has at home. However, she hasn't checked her BG yet. She attempted to do so over the phone but got two different error messages.   Persistent asthma: Patient is noncompliant to inhaler regimen  Heart failure: Patient has not been weighing  herself daily  Goals Addressed            This Visit's Progress   . "I guess I need to get better control of my diabetes" (pt-stated)        Clinical Goals:  Over the next 7 days, patient will report understanding of how to use glucometer Interventions:  12/17: CCM Pharmacist will provide counseling session (first telephonically and then in person if necessary) demonstrating how to properly use glucometer     . "I need help straightening out my medicine" (pt-stated)       Clinical Goal(s):  In the next 10 days, patient will gather documents needed to apply for medication assistance for her inhalers.  Interventions:  CCM Pharmacist and patient will sit down and apply for manufacturer programs for 2020.    Marland Kitchen "I want to make sure I'm managing my heart failure right" (pt-stated)   Not on track     Clinical Goals: Over then next 7 days, patient will report understanding plan of care related to HF self monitoring/self care  Interventions: HF education reinforced including importance of daily weights, reporting weight gain of 3lb over night or 5lb in a week to provider, and following a low NA diet.       Plan:  - provided counseling on how to use glucometer. Patient states she will read the booklet and if she still can't get the hang of it by Friday, she will make an appointment with CCM pharmacist -CCM pharmacist will fill out medication assistance applications for 1594 with patient following Christmas but before the new year -patient will weigh herself every day first thing in the morning after using the bathroom for 7 days. Reviewed CHF action plan.     Follow up Friday, December 20 via telephone for glucometer training session/scheduling office visit   Ruben Reason, PharmD Clinical Pharmacist [place] [phone]

## 2018-02-07 ENCOUNTER — Ambulatory Visit: Payer: Self-pay | Admitting: Pharmacist

## 2018-02-07 ENCOUNTER — Telehealth: Payer: Self-pay

## 2018-02-07 NOTE — Chronic Care Management (AMB) (Signed)
  Chronic Care Management   Note  02/07/2018 Name: Tricia [name] MRN: [id number] DOB: 1960-05-02    57 y.o. year old female referred to Chronic Care Management by Dr. [name] for medication assistance and diabetes management.  Call today was a follow up on patient's glucometer and if she was able to get the glucometer working by reviewing the instructions packet.  Was unable to reach patient via telephone today and have left HIPAA compliant message with Ms. Schiraldi partner asking patient to return my call. (unsuccessful outreach #1). .  Plan: Will follow-up within 3-5  business days via telephone.   Ruben Reason, PharmD Clinical Pharmacist [facility] [phone]

## 2018-02-13 ENCOUNTER — Ambulatory Visit: Payer: Self-pay | Admitting: Pharmacist

## 2018-02-14 NOTE — Chronic Care Management (AMB) (Signed)
  Chronic Care Management   Note  02/14/2018 Name: Tricia [name] MRN: [id number] DOB: 10-15-1960    57 y.o. year old female referred to Chronic Care Management by Dr. [name] for medication and disease management related to asthma, T2DM, CHF, tobacco use, and depression.   Call placed today to follow up on patient's glucometer and if she needed to set up an office visit with CCM pharmacist to review how to use her glucometer.   Was unable to reach patient via telephone today and have left HIPAA compliant message with patient's partner asking patient to return my call. (unsuccessful outreach #2).  Plan: Will follow-up within 3-5  business days via telephone.   Ruben Reason, PharmD Clinical Pharmacist [facility] [phone]

## 2018-02-18 ENCOUNTER — Ambulatory Visit: Payer: Self-pay | Admitting: Pharmacist

## 2018-02-18 DIAGNOSIS — J4541 Moderate persistent asthma with (acute) exacerbation: Secondary | ICD-10-CM

## 2018-02-18 DIAGNOSIS — E1142 Type 2 diabetes mellitus with diabetic polyneuropathy: Secondary | ICD-10-CM

## 2018-02-18 NOTE — Chronic Care Management (AMB) (Signed)
  Chronic Care Management   Follow Up Note   02/18/2018 Name: RAMIE PALLADINO MRN: [id number] DOB: 1961-01-03  Referred by: [name], MD Reason for referral : Chronic Care Management ([place])    Subjective: "I'm doing alright today"   Assessment: Follow up call placed to patient to verify that she was able to check her blood sugars using her glucometer. HIPAA identifiers verified. Patient states that she has been using her glucometer and that her sugars have been "pretty good", reporting a reading of 160 mg/dL yesterday.   Goals Addressed            This Visit's Progress   . COMPLETED: "I guess I need to get better control of my diabetes" (pt-stated)        Clinical Goals:  Over the next 7 days, patient will report understanding of how to use glucometer Interventions:  12/17: CCM Pharmacist will provide counseling session (first telephonically and then in person if necessary) demonstrating how to properly use glucometer     . "I need help straightening out my medicine" (pt-stated)       Clinical Goal(s):  In the next 10 days, patient will gather documents needed to apply for medication assistance for her inhalers.  Interventions:  02/18/18: CCM Pharmacist and patient scheduled a face to face office visit to fill out medication assistance applications for [id number]        Plan: CCM pharmacist and patient scheduled an office visit for next week.   Ruben Reason, PharmD Clinical Pharmacist [facility] [phone]

## 2018-02-18 NOTE — Patient Instructions (Signed)
The patient verbalized understanding of instructions provided today and declined a print copy of patient instruction materials.   

## 2018-02-25 ENCOUNTER — Ambulatory Visit: Payer: Medicare PPO

## 2018-02-25 ENCOUNTER — Other Ambulatory Visit: Payer: Self-pay | Admitting: Family Medicine

## 2018-02-25 DIAGNOSIS — G4700 Insomnia, unspecified: Secondary | ICD-10-CM

## 2018-02-25 DIAGNOSIS — E1142 Type 2 diabetes mellitus with diabetic polyneuropathy: Secondary | ICD-10-CM

## 2018-02-25 DIAGNOSIS — I5032 Chronic diastolic (congestive) heart failure: Secondary | ICD-10-CM

## 2018-02-25 NOTE — Chronic Care Management (AMB) (Signed)
Chronic Care Management   Follow Up Note   02/25/2018 Name: Tricia [name] MRN: [id number] DOB: 05/23/1960  Referred by: [name], MD Reason for referral : Chronic Care Management (DM/medication assistance)    Subjective: "I am doing OK but under a lot of stress with family issues"   Objective:  Lab Results  Component Value Date   HGBA1C 14.0 (A) 10/29/2017      Assessment:  58 y.o.year old femalereferred to Chronic Care Management by Dr. Ladon Applebaum support and coordination for multiple chronic health and social health conditions. Chronic conditions include DM, CHF, asthma. CCM Team has an ongoing relationship with Tricia [name] in order to assist her with optimal management her chronic conditions. Today I met with Tricia [name] to begin the process of application completion for medication assistance and to review glucometer use.   Goals Addressed    . "I guess I need to get better control of my diabetes" (pt-stated)        Unfortunately Tricia [name] did not bring her glucometer to today's session however she states a family member assisted her with setup and glucometer is now working properly. She reports am fasting glucose readings of 160s. Post prandial reading last evening was 230 after eating two deserts. She understands how simple sugars increase blood glucose readings and states she "was good" during the holidays. She admits to family stress and understands it is easy to neglect her health when she is worried about others. She remains motivated to continue to work on her health but needs encouragement and reinforcement. She declines counseling services.   Clinical Goals:  Over the next 7 days, patient will report understanding of how to use glucometer goal met 02/25/2018 Over the next 14 days, patient will report adherence to glucose monitoring/recording and compliance with ADA diet  Interventions:  12/17: CCM Pharmacist will provide counseling session  (first telephonically and then in person if necessary) demonstrating how to properly use glucometer  02/25/2018 Confirmed patient understands how to use new glucometer. Reinforced basic DM education including importance of glucose monitoring, medication and ADA diet adherance     . "I need help straightening out my medicine" (pt-stated)       Tricia [name] admits to financial difficulty related to medication cost/copays. She request assistance with cost of Spiriva, Dulera, Proventil, Basaglar, and Humalog and has been working with [facility] Pharmacist Ruben Reason. Tricia [name] presents today with income documentation from [employer] which will be needed for medication assistance application process.  Clinical Goal(s):  In the next 10 days, patient will gather documents needed to apply for medication assistance for her inhalers. Goal met 02/25/2018  In the next 30 days patient will adhere to medication regimen as evidenced by patient statement   Interventions:  02/18/18: CCM Pharmacist and patient scheduled a face to face office visit to fill out medication assistance applications for [phone]  04/22/1960 CCM RN CM met with patient face to face and provided assistance with medication assistance application completion    . "I want to make sure I'm managing my heart failure right" (pt-stated)       Tricia [name] reduction of her sodium intake. She limits the foods she eats from restaurants. She does not add salt to any foods cooked at home. Today upon assessment, her low extremity edema has improved. She is weighing herself when she remembers and understands to take an extra lasix when she has swelling in her legs and/or hands.  Clinical Goals: Over  then next 7 days, patient will report understanding plan of care related to HF self monitoring/self care goal met 02/25/2018  Over the next 14 days patient will report continued self monitoring/self care strategies related to HF diagnosis     Interventions: HF education reinforced including importance of daily weights, reporting weight gain of 3lb over night or 5lb in a week to provider, and following a low NA diet.  02/25/2018 Reinforced HF education, low sodium diet adherence, daily weights, and medication compliance       Plan: CCM RN CM will follow up with patient in 2 weeks   Carolie Mcilrath E. Rollene Rotunda, RN, BSN Nurse Care Coordinator [facility] Care Management  [phone]

## 2018-02-25 NOTE — Telephone Encounter (Signed)
Refill request for general medication: Trazodone 50 mg  Last office visit: 01/30/2018  Last physical exam: 01/23/2018  Follow-ups on file. 01/27/2019

## 2018-03-12 DIAGNOSIS — Z5181 Encounter for therapeutic drug level monitoring: Secondary | ICD-10-CM | POA: Diagnosis not present

## 2018-03-12 DIAGNOSIS — Z716 Tobacco abuse counseling: Secondary | ICD-10-CM | POA: Diagnosis not present

## 2018-03-12 DIAGNOSIS — M545 Low back pain: Secondary | ICD-10-CM | POA: Diagnosis not present

## 2018-03-12 DIAGNOSIS — Z720 Tobacco use: Secondary | ICD-10-CM | POA: Diagnosis not present

## 2018-03-12 DIAGNOSIS — Z79891 Long term (current) use of opiate analgesic: Secondary | ICD-10-CM | POA: Diagnosis not present

## 2018-03-12 DIAGNOSIS — Z87891 Personal history of nicotine dependence: Secondary | ICD-10-CM | POA: Diagnosis not present

## 2018-03-12 DIAGNOSIS — M5430 Sciatica, unspecified side: Secondary | ICD-10-CM | POA: Diagnosis not present

## 2018-03-12 DIAGNOSIS — G894 Chronic pain syndrome: Secondary | ICD-10-CM | POA: Diagnosis not present

## 2018-03-12 DIAGNOSIS — F17200 Nicotine dependence, unspecified, uncomplicated: Secondary | ICD-10-CM | POA: Diagnosis not present

## 2018-03-13 ENCOUNTER — Other Ambulatory Visit: Payer: Self-pay | Admitting: Pharmacy Technician

## 2018-03-13 ENCOUNTER — Ambulatory Visit: Payer: Self-pay

## 2018-03-13 DIAGNOSIS — E1142 Type 2 diabetes mellitus with diabetic polyneuropathy: Secondary | ICD-10-CM

## 2018-03-13 DIAGNOSIS — I5032 Chronic diastolic (congestive) heart failure: Secondary | ICD-10-CM

## 2018-03-13 NOTE — Patient Outreach (Signed)
[facility]) Care Management  03/13/2018  Tricia [name] 14-May-1960 [id number]                                                  Medication Assistance Referral  Referral From: [place]  Medication/Company: [employer] Humalog / [employer] Faxed completed application and required documents to company.  Will follow up with company in 7-10 business days to check status of application.  Maud Deed Chana Bode [place] Certified Pharmacy Technician [place] Management Direct Dial:[phone]

## 2018-03-13 NOTE — Chronic Care Management (AMB) (Signed)
Chronic Care Management   Follow Up Note   03/13/2018 Name: Tricia [name] MRN: [id number] DOB: 12/22/1960  Referred by: [name], MD Reason for referral : Chronic Care Management (two week follow up DM)   Subjective: "I am doing OK but I went to my pain doctor and he said C2,3,4,5 were much worse and I may need surgery"   Objective:  BP Readings from Last 3 Encounters:  01/30/18 (!) 82/64  01/23/18 (!) 98/58  11/05/17 104/68   Lab Results  Component Value Date   HGBA1C 14.0 (A) 10/29/2017     Assessment: 58 y.o.year old femalereferred to Chronic Care Management by Dr. Ladon Applebaum support and coordination for multiple chronic health and social health conditions. Chronic conditions include DM, CHF, asthma. CCM Team has an ongoing relationship with Tricia [name] in order to assist her with optimal management her chronic conditions. Today CCM RN CM completed a 2 week telephone follow up to discuss progression towards goals.  Goals Addressed    . "I guess I need to get better control of my diabetes" (pt-stated)   On track    Tricia [name] states she is now checking her blood sugars daily. Fasting blood glucose readings between 150s and 160s. With a few readings 170s. She admits to taking her medications as prescribed. She describes eating more healthy meals including home made sandwiches for lunch and cereal with milk for breakfast. She is encouraged today to make sure her cereal is a serving and to include protein such as a piece of cheese, peanut butter, or boiled egg with her breakfast. She is drinking more water and less coffee and sweetened drinks.  Clinical Goals:   Over the next 7 days, patient will report understanding of how to use glucometer goal met 02/25/2018  Over the next 14 days, patient will report adherence to glucose monitoring/recording and compliance with ADA diet goal met 03/13/2018  Over the next 30 days, patient will report continued adherence  with glucose monitoring, ADA diet, and medications regimen.   Interventions:  12/17: CCM Pharmacist will provide counseling session (first telephonically and then in person if necessary) demonstrating how to properly use glucometer  02/25/2018 Confirmed patient understands how to use new glucometer. Reinforced basic DM education including importance of glucose monitoring, medication and ADA diet adherance  03/13/2018 Discussed average glucometer readings. Provided emotional support and reassurance. Reinforced basic DM education and medication compliance.     Marland Kitchen "I need help straightening out my medicine" (pt-stated)   On track    Clinical Goal(s):  In the next 10 days, patient will gather documents needed to apply for medication assistance for her inhalers.  Interventions:  02/18/18: CCM Pharmacist and patient scheduled a face to face office visit to fill out medication assistance applications for [id number]  07/24/4648 CCM RN CM met with patient face to face and provided assistance with medication assistance application completion    . "I want to make sure I'm managing my heart failure right" (pt-stated)   On track     Clinical Goals: Over then next 7 days, patient will report understanding plan of care related to HF self monitoring/self care  Interventions: HF education reinforced including importance of daily weights, reporting weight gain of 3lb over night or 5lb in a week to provider, and following a low NA diet.  02/25/2018 Reinforced HF education and low sodium diet adherence  03/13/2018 Discussed sodium intake and role in lower extremity swelling. Encouraged elevation of extremities and taking diuretics as  prescribed.      Telephone follow up appointment with CCM team member scheduled for: 30 days      E. Rollene Rotunda, RN, BSN Nurse Care Coordinator [facility] Care Management  [phone]

## 2018-03-13 NOTE — Patient Instructions (Signed)
1. Continue to take all medications as prescribed. Notify the [facility] Team or your PCP if you cannot afford your medications 2. Please continue to monitor the swelling in your legs. Keep them elevated as much as you can and take your fluid pill as needed. 3.Continue to eat a healthy, low sodium, diabetic diet which includes limiting (not restricting) your carbohydrates (breads, potatoes, rice, sweet snacks, sugar) Continue to review handouts on diet previously given. 4. Contact the CCM Team with any questions, concerns, or needs  CCM (Chronic Care Management) Team   [name] Fountain RN, BSN Nurse Care Coordinator  [phone]  Ruben Reason PharmD  Clinical Pharmacist  [phone]  Goals Addressed            This Visit's Progress   . "I guess I need to get better control of my diabetes" (pt-stated)   On track     Clinical Goals:  Over the next 7 days, patient will report understanding of how to use glucometer goal met 02/25/2018 Over the next 14 days, patient will report adherence to glucose monitoring/recording and compliance with ADA diet goal met 03/13/2018  Interventions:  12/17: CCM Pharmacist will provide counseling session (first telephonically and then in person if necessary) demonstrating how to properly use glucometer  02/25/2018 Confirmed patient understands how to use new glucometer. Reinforced basic DM education including importance of glucose monitoring, medication and ADA diet adherance  03/13/2018 Discussed average glucometer readings. Provided emotional support and reassurance. Reinforced basic DM education and medication compliance.     Marland Kitchen "I need help straightening out my medicine" (pt-stated)   On track    Clinical Goal(s):  In the next 10 days, patient will gather documents needed to apply for medication assistance for her inhalers.  Interventions:  02/18/18: CCM Pharmacist and patient scheduled a face to face office visit to fill out medication assistance  applications for [id number]  08/28/238 CCM RN CM met with patient face to face and provided assistance with medication assistance application completion    . "I want to make sure I'm managing my heart failure right" (pt-stated)   On track     Clinical Goals: Over then next 7 days, patient will report understanding plan of care related to HF self monitoring/self care  Interventions: HF education reinforced including importance of daily weights, reporting weight gain of 3lb over night or 5lb in a week to provider, and following a low NA diet.  02/25/2018 Reinforced HF education and low sodium diet adherence  03/13/2018 Discussed sodium intake and role in lower extremity swelling. Encouraged elevation of extremities and taking diuretics as prescribed.    The patient verbalized understanding of instructions provided today and declined a print copy of patient instruction materials.

## 2018-03-14 ENCOUNTER — Other Ambulatory Visit: Payer: Self-pay | Admitting: Family Medicine

## 2018-03-14 DIAGNOSIS — J411 Mucopurulent chronic bronchitis: Secondary | ICD-10-CM

## 2018-03-14 NOTE — Telephone Encounter (Signed)
Refill request for general medication: Ventolin HFA 108 mcg inhaler  Last office visit: 01/30/2018  Last physical exam: 01/23/2018  Follow-ups on file. 01/27/2019

## 2018-03-16 NOTE — Telephone Encounter (Signed)
She needs follow up

## 2018-03-17 NOTE — Telephone Encounter (Signed)
Spoke with pt and she scheduled an appt for this Wednesday with dr [name]

## 2018-03-17 NOTE — Telephone Encounter (Signed)
She still needs an appointment

## 2018-03-17 NOTE — Telephone Encounter (Signed)
Spoke with pt and she stated that the pharmacy sent it to the work office therefore she did not schedule an appt

## 2018-03-19 ENCOUNTER — Encounter: Payer: Self-pay | Admitting: Family Medicine

## 2018-03-19 ENCOUNTER — Ambulatory Visit: Payer: Medicare PPO | Admitting: Family Medicine

## 2018-03-19 VITALS — BP 104/62 | HR 81 | Temp 97.6°F | Resp 16 | Ht 69.0 in | Wt 160.0 lb

## 2018-03-19 DIAGNOSIS — I5032 Chronic diastolic (congestive) heart failure: Secondary | ICD-10-CM | POA: Diagnosis not present

## 2018-03-19 DIAGNOSIS — Z1159 Encounter for screening for other viral diseases: Secondary | ICD-10-CM | POA: Diagnosis not present

## 2018-03-19 DIAGNOSIS — R809 Proteinuria, unspecified: Secondary | ICD-10-CM | POA: Diagnosis not present

## 2018-03-19 DIAGNOSIS — E1169 Type 2 diabetes mellitus with other specified complication: Secondary | ICD-10-CM | POA: Diagnosis not present

## 2018-03-19 DIAGNOSIS — E785 Hyperlipidemia, unspecified: Secondary | ICD-10-CM

## 2018-03-19 DIAGNOSIS — E1142 Type 2 diabetes mellitus with diabetic polyneuropathy: Secondary | ICD-10-CM | POA: Diagnosis not present

## 2018-03-19 DIAGNOSIS — E1129 Type 2 diabetes mellitus with other diabetic kidney complication: Secondary | ICD-10-CM

## 2018-03-19 DIAGNOSIS — J441 Chronic obstructive pulmonary disease with (acute) exacerbation: Secondary | ICD-10-CM | POA: Diagnosis not present

## 2018-03-19 DIAGNOSIS — F322 Major depressive disorder, single episode, severe without psychotic features: Secondary | ICD-10-CM | POA: Diagnosis not present

## 2018-03-19 DIAGNOSIS — Z794 Long term (current) use of insulin: Secondary | ICD-10-CM

## 2018-03-19 LAB — POCT GLYCOSYLATED HEMOGLOBIN (HGB A1C): HBA1C, POC (CONTROLLED DIABETIC RANGE): 13.7 % — AB (ref 0.0–7.0)

## 2018-03-19 LAB — POCT UA - MICROALBUMIN: Microalbumin Ur, POC: 50 mg/L

## 2018-03-19 MED ORDER — VENLAFAXINE HCL ER 75 MG PO CP24: 225.0000 mg | ORAL_CAPSULE | Freq: Every day | ORAL | 0 refills | Status: DC

## 2018-03-19 MED ORDER — MONTELUKAST SODIUM 10 MG PO TABS: 10.0000 mg | ORAL_TABLET | Freq: Every day | ORAL | 1 refills | Status: DC

## 2018-03-19 MED ORDER — ROSUVASTATIN CALCIUM 5 MG PO TABS: 5.0000 mg | ORAL_TABLET | Freq: Every day | ORAL | 1 refills | Status: DC

## 2018-03-19 NOTE — Patient Instructions (Signed)
You need a total of 225 mg of Effexor daily

## 2018-03-19 NOTE — Progress Notes (Signed)
Name: Tricia [name]   MRN: [id number]    DOB: 1960/07/06   Date:03/19/2018       Progress Note  Subjective  Chief Complaint  Chief Complaint  Patient presents with  . Medication Refill  . Diabetes    Checks a couple of times daily- Portia and Almyra Free have been assisting her w/ the cost of medications  . Hyperlipidemia  . Depression  . Asthma    Still unable to afford inhalers    HPI  Diabetes type II: she used to see Dr. Manuella Ghazi, she was seen in our office back in September and hgbA1C had gone from 7.1% to 14 %, she had  been out of Lantus and Novolog for months now, she was still taking Metformin 1000 mg daily. At that time we resumed insulin, she has been getting insulin from the drug company for free. She is only using 20  units of Basglar, and novolog 10 units BID, explained that we will need to refer her to endocrinologist. She states glucose at home is around 160 BID but she did not bring glucose log for review.   She has noticed polydipsia, polyuria but denies polyphagia. Eye appointment is scheduled for October 2019 but could not go because of cost,  urine micro up was done today and is elevated, however she states previously tried ACE and bp dropped very low. She has neuropathy and is taking gabapentin but she is not sure it helps, she also has dyslipidemia with very low HDL, but last LDL was close to goal on Crestor 5 mg daily.   Asthma and COPD  under the care of Dr. Raul Del. Still has daily cough and intermittent SOB. She states she uses Ventolin daily but has been out of Symbicort, still has duoneb at home, states inhalers very expensive and cannot afford. She states recent URI and symptoms are a little worse, no fever or chills.   Major Depression: chronic and recurrent, phq 9 is higher. She lives with boyfriend and he has DM with diabetic foot ulcer and heart failure, she also worries about her daughter's the youngest one has multiple medical problems, also has a daughter  in [place] that is a drug addict. She is on disability , struggling financially, cannot afford seeing psychiatrist of therapist Taking effexor and it does not seem to be working. She is willing to try going up on Effexor today   CHF: doing well at this time, she takes lasix prn only, no swelling, she has orthopnea, but she states from post-nasal drainage. She used to see Dr. Fletcher Anon but wants to see someone else. She would like to see Dr. Ubaldo Glassing instead, and is willing to have a referral placed today   Patient Active Problem List   Diagnosis Date Noted  . Benign neoplasm of descending colon   . Polyp of sigmoid colon   . 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 Polyneuropathy 10/21/2014  . Chronic venous insufficiency 10/05/2014  . Bilateral leg edema 08/16/2014  . Major depression in partial remission ([place]) 08/16/2014  . Acid reflux 08/16/2014  . 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 Asthma, moderate persistent 08/16/2014  . Carpal tunnel syndrome 08/16/2014  . Cervical pain 08/16/2014  . CAFL (chronic airflow limitation) ([place]) 08/16/2014  . Type 2 diabetes mellitus with peripheral neuropathy ([place]) 08/16/2014  . Diabetes mellitus type 2, insulin dependent ([place]) 08/16/2014  . Dyslipidemia 08/16/2014  . Current smoker 08/16/2014  . Essential (primary) hypertension 08/16/2014  . Benign neoplasm of  stomach 08/16/2014  . Gout 08/16/2014  . HLD (hyperlipidemia) 08/16/2014  . Low back pain 08/16/2014  . Lumbar radiculopathy 08/16/2014  . Headache, migraine 08/16/2014  . Arthralgia of multiple joints 08/16/2014  . Avitaminosis D 08/16/2014  . Primary osteoarthritis of both knees 06/22/2014  . Benign essential tremor 10/02/2013  . Cervical dystonia 10/02/2013  . Chronic diastolic heart failure ([place]) 11/16/2012  . Chronic pain 11/13/2012    Past Surgical History:  Procedure Laterality Date  . CARPAL TUNNEL RELEASE Bilateral    x2 right, 1x on left  . COLONOSCOPY    .  COLONOSCOPY WITH PROPOFOL N/A 04/25/2017   Procedure: COLONOSCOPY WITH PROPOFOL;  Surgeon: [name], MD;  Location: [facility];  Service: Endoscopy;  Laterality: N/A;  diabetic-oral med  . DILATION AND CURETTAGE OF UTERUS    . EXTERNAL EAR SURGERY Left    x2  . POLYPECTOMY  04/25/2017   Procedure: POLYPECTOMY INTESTINAL;  Surgeon: [name], MD;  Location: [facility];  Service: Endoscopy;;  . SPINE SURGERY     herniated disc  . TUBAL LIGATION      Family History  Problem Relation Age of Onset  . Emphysema Mother   . Stroke Father   . Throat cancer Father   . Multiple sclerosis Daughter   . Bipolar disorder Daughter   . Cervical cancer Daughter   . Bipolar disorder Daughter     Social History   Socioeconomic History  . Marital status: Divorced    Spouse name: Not on file  . Number of children: 2  . Years of education: Not on file  . Highest education level: Associate degree: academic program  Occupational History  . Occupation: Disability  Social Needs  . Financial resource strain: Very hard  . Food insecurity:    Worry: Sometimes true    Inability: Sometimes true  . Transportation needs:    Medical: No    Non-medical: No  Tobacco Use  . Smoking status: Current Every Day Smoker    Packs/day: 1.00    Years: 41.00    Pack years: 41.00    Types: Cigarettes  . Smokeless tobacco: Never Used  Substance and Sexual Activity  . Alcohol use: No    Alcohol/week: 0.0 standard drinks  . Drug use: No  . Sexual activity: Not Currently  Lifestyle  . Physical activity:    Days per week: 0 days    Minutes per session: 0 min  . Stress: Very much  Relationships  . Social connections:    Talks on phone: Three times a week    Gets together: Once a week    Attends religious service: Never    Active member of club or organization: No    Attends meetings of clubs or organizations: Never    Relationship status: Divorced  . Intimate partner violence:     Fear of current or ex partner: No    Emotionally abused: No    Physically abused: No    Forced sexual activity: No  Other Topics Concern  . Not on file  Social History Narrative   Lives with her boyfriend, she has two daughters. Her youngest daughter  has multiple medical problems - used to do drugs and is now very sick, oldest lives in [place] still an alcoholic and drug addict.  Pt's boyfriend had leg amputation and she has to care for him.      Current Outpatient Medications:  .  ALPRAZolam (XANAX) 0.5 MG tablet, Take  1 tablet by mouth 2 (two) times daily as needed. , Disp: , Rfl:  .  blood glucose meter kit and supplies KIT, Dispense accuchek aviva plus; can change if needed e11.9, Disp: 1 each, Rfl: 0 .  budesonide-formoterol (SYMBICORT) 160-4.5 MCG/ACT inhaler, Inhale 2 puffs into the lungs 2 (two) times daily., Disp: 1 Inhaler, Rfl: 0 .  calcium carbonate (TUMS EX) 750 MG chewable tablet, Chew 2 tablets by mouth as needed for heartburn., Disp: , Rfl:  .  cetirizine (ZYRTEC) 10 MG tablet, Take 10 mg by mouth as needed for allergies., Disp: , Rfl:  .  colchicine (COLCRYS) 0.6 MG tablet, , Disp: , Rfl:  .  dextromethorphan (DELSYM) 30 MG/5ML liquid, Take by mouth., Disp: , Rfl:  .  furosemide (LASIX) 40 MG tablet, Take 1 tablet (40 mg total) by mouth daily as needed., Disp: 30 tablet, Rfl: 2 .  gabapentin (NEURONTIN) 600 MG tablet, Take 1 tablet (600 mg total) by mouth 3 (three) times daily., Disp: 90 tablet, Rfl: 3 .  glucose blood test strip, Use as directed to check blood glucose daily, Disp: 100 each, Rfl: 2 .  Insulin Glargine (BASAGLAR KWIKPEN) 100 UNIT/ML SOPN, Inject 0.2-0.5 mLs (20-50 Units total) into the skin daily., Disp: 15 mL, Rfl: 0 .  insulin lispro (HUMALOG) 100 UNIT/ML KiwkPen, Inject 0.04-0.1 mLs (4-10 Units total) into the skin 2 (two) times daily before lunch and supper., Disp: 15 mL, Rfl: 0 .  Insulin Pen Needle 32G X 6 MM MISC, 2 each by Does not apply route daily.,  Disp: 200 each, Rfl: 2 .  ipratropium-albuterol (DUONEB) 0.5-2.5 (3) MG/3ML SOLN, Inhale 3 mLs into the lungs every 6 (six) hours as needed., Disp: 360 mL, Rfl: 3 .  metFORMIN (GLUCOPHAGE-XR) 750 MG 24 hr tablet, Take 1 tablet (750 mg total) by mouth daily with breakfast., Disp: 90 tablet, Rfl: 1 .  montelukast (SINGULAIR) 10 MG tablet, Take 1 tablet (10 mg total) by mouth daily. afternoon, Disp: 90 tablet, Rfl: 1 .  morphine (MS CONTIN) 15 MG 12 hr tablet, Take 1 tablet by mouth 2 (two) times daily., Disp: , Rfl:  .  naproxen (NAPROSYN) 500 MG tablet, Take 500 mg by mouth as needed. , Disp: , Rfl:  .  oxyCODONE (ROXICODONE) 15 MG immediate release tablet, Take 15 mg by mouth every 6 (six) hours as needed for pain., Disp: , Rfl:  .  rizatriptan (MAXALT) 5 MG tablet, Take 5 mg by mouth as needed., Disp: , Rfl:  .  rosuvastatin (CRESTOR) 5 MG tablet, Take 1 tablet (5 mg total) by mouth at bedtime., Disp: 90 tablet, Rfl: 1 .  theophylline (UNIPHYL) 400 MG 24 hr tablet, Take 1 tablet by mouth daily., Disp: , Rfl:  .  tiotropium (SPIRIVA HANDIHALER) 18 MCG inhalation capsule, Place 1 capsule into inhaler and inhale daily. pm, Disp: , Rfl:  .  tiZANidine (ZANAFLEX) 4 MG tablet, Take 1 tablet by mouth 4 (four) times daily., Disp: , Rfl:  .  topiramate (TOPAMAX) 100 MG tablet, Take 1 tablet by mouth 2 (two) times daily., Disp: , Rfl:  .  traZODone (DESYREL) 50 MG tablet, TAKE 1 TABLET BY MOUTH AT BEDTIME, Disp: 30 tablet, Rfl: 3 .  venlafaxine XR (EFFEXOR-XR) 150 MG 24 hr capsule, Take 1 capsule (150 mg total) by mouth daily with breakfast., Disp: 90 capsule, Rfl: 1 .  VENTOLIN HFA 108 (90 Base) MCG/ACT inhaler, INHALE 1 PUFF BY MOUTH AS NEEDED, Disp: 18 g, Rfl: 0  Allergies  Allergen Reactions  . Augmentin [Amoxicillin-Pot Clavulanate] Diarrhea  . Penicillins Itching    I personally reviewed active problem list, medication list, allergies, family history, social history with the patient/caregiver  today.   ROS  Constitutional: Negative for fever positive for weight change.  Respiratory: Positive for cough and shortness of breath.   Cardiovascular: Negative for chest pain or palpitations.  Gastrointestinal: Negative for abdominal pain, no bowel changes.  Musculoskeletal: Positive  for gait problem intermittent secondary to pain  but no joint swelling.  Skin: Negative for rash.  Neurological: Negative for dizziness or headache.  No other specific complaints in a complete review of systems (except as listed in HPI above).  Objective  Vitals:   03/19/18 1349  BP: 104/62  Pulse: 81  Resp: 16  Temp: 97.6 F (36.4 C)  TempSrc: Oral  SpO2: 96%  Weight: 160 lb (72.6 kg)  Height: _0  (1.753 m)    Body mass index is 23.63 kg/m.  Physical Exam  Constitutional: Patient appears uncomfortable, cooperative.  HEENT: head atraumatic, normocephalic, pupils equal and reactive to light,  neck supple, throat within normal limits Cardiovascular: Normal rate, regular rhythm and normal heart sounds.  No murmur heard. No BLE edema. Pulmonary/Chest: Effort normal , she has distant lung sounds, rhonchi and some wheezing scattered  Abdominal: Soft.  There is no tenderness. Psychiatric: Patient has a normal mood and affect. behavior is normal. Judgment and thought content normal.  Recent Results (from the past 2160 hour(s))  POCT HgB A1C     Status: Abnormal   Collection Time: 03/19/18  2:04 PM  Result Value Ref Range   Hemoglobin A1C     HbA1c POC (<> result, manual entry)     HbA1c, POC (prediabetic range)     HbA1c, POC (controlled diabetic range) 13.7 (A) 0.0 - 7.0 %  POCT UA - Microalbumin     Status: Normal   Collection Time: 03/19/18  2:07 PM  Result Value Ref Range   Microalbumin Ur, POC 50 mg/L   Creatinine, POC     Albumin/Creatinine Ratio, Urine, POC      Diabetic Foot Exam: Diabetic Foot Exam - Simple   Simple Foot Form Diabetic Foot exam was performed with the  following findings:  Yes 03/19/2018  2:30 PM  Visual Inspection See comments:  Yes Sensation Testing Intact to touch and monofilament testing bilaterally:  Yes Pulse Check Posterior Tibialis and Dorsalis pulse intact bilaterally:  Yes Comments Hammer toes, dry skin, bunion      PHQ2/9: Depression screen [facility] 2/9 03/19/2018 01/30/2018 01/23/2018 10/29/2017 06/21/2017  Decreased Interest 3 1 0 1 0  Down, Depressed, Hopeless _1 PHQ - 2 Score _2 Altered sleeping _3 -  Tired, decreased energy _4 -  Change in appetite 3 0 3 2 -  Feeling bad or failure about yourself  _5 -  Trouble concentrating _6 -  Moving slowly or fidgety/restless 3 3 0 0 -  Suicidal thoughts 0 0 0 0 -  PHQ-9 Score _7 -  Difficult doing work/chores Somewhat difficult Extremely dIfficult - Somewhat difficult -     Fall Risk: Fall Risk  03/19/2018 01/30/2018 01/23/2018 11/05/2017 10/29/2017  Falls in the past year? _8 - Yes  Number falls in past yr: _9 - 2 or more  Injury with Fall? 0  1 0 - Yes  Comment - - - - -  Risk for fall due to : - - Impaired balance/gait History of fall(s);Medication side effect -  Risk for fall due to: Comment - - - - -  Follow up - - Falls prevention discussed - -    From [facility]:   SPIROMETRY: FVC was 3.44 liters, 95% of predicted FEV1 was 1.67, 58% of predicted FEV1 ratio was 49 FEF 25-75% liters per second was 20% of predicted  FLOW VOLUME LOOP: Expiratory flow volume loop is delayed, c/w obstruction  Impression Spirometry is c/w severe obstruction   Assessment & Plan  1. Type 2 diabetes mellitus with peripheral neuropathy (HCC)  - POCT HgB A1C - POCT UA - Microalbumin - Ambulatory referral to Endocrinology  2. Need for hepatitis C screening test  Advised to have test done , but she wants to hold off for now. She also refuses to have pap smear done  3. COPD with acute exacerbation  (HCC)  - montelukast  (SINGULAIR) 10 MG tablet; Take 1 tablet (10 mg total) by mouth daily. afternoon  Dispense: 90 tablet; Refill: 1  She has been out of all her inhalers for months, I will give her a sample of trelegy today and advised follow up with Dr. Raul Del   4. Dyslipidemia (high LDL; low HDL)  - rosuvastatin (CRESTOR) 5 MG tablet; Take 1 tablet (5 mg total) by mouth at bedtime.  Dispense: 90 tablet; Refill: 1  5. DM type 2 with diabetic dyslipidemia (HCC)  - rosuvastatin (CRESTOR) 5 MG tablet; Take 1 tablet (5 mg total) by mouth at bedtime.  Dispense: 90 tablet; Refill: 1 - Ambulatory referral to Endocrinology  6. Chronic diastolic heart failure (HCC)  Takes lasix prn, last echo in 2014  Willing to see Dr. Ubaldo Glassing now   7. Severe  recurrent major depression ([place])  She is more depressed, taking effexor for many years, states holiday season and dark days have made her feel worse, we will increase dose of effexor to 225 mg daily   8. Type 2 diabetes mellitus with microalbuminuria, with long-term current use of insulin (HCC)  She cannot tolerate bp medication, took lisinopril in the past and bp dropped

## 2018-03-20 ENCOUNTER — Other Ambulatory Visit: Payer: Self-pay | Admitting: Pharmacy Technician

## 2018-03-20 ENCOUNTER — Ambulatory Visit (INDEPENDENT_AMBULATORY_CARE_PROVIDER_SITE_OTHER): Payer: Medicare PPO | Admitting: Pharmacist

## 2018-03-20 DIAGNOSIS — E785 Hyperlipidemia, unspecified: Secondary | ICD-10-CM

## 2018-03-20 DIAGNOSIS — E1142 Type 2 diabetes mellitus with diabetic polyneuropathy: Secondary | ICD-10-CM | POA: Diagnosis not present

## 2018-03-20 DIAGNOSIS — E1169 Type 2 diabetes mellitus with other specified complication: Secondary | ICD-10-CM | POA: Diagnosis not present

## 2018-03-20 NOTE — Patient Outreach (Signed)
[facility]) Care Management  03/20/2018  ARYANNE GILLELAND 1960/08/05 [id number]   Informed via in-basket from [place] that patient has been approved for Humalog and Basaglar from [employer] until 02/19/2019.  Maud Deed Chana Bode [place] Certified Pharmacy Technician [place] Management Direct [phone]

## 2018-03-20 NOTE — Chronic Care Management (AMB) (Signed)
  Chronic Care Management   Note  03/20/2018 Name: Tricia [name] MRN: [id number] DOB: 13-May-1960  Medication Assistance: Patient is has been approved for Basaglar and Humalog made by [employer] and prescribed by Dr. [name]. Approved 03/20/18 and expires 02/19/19. Approval letter uploaded under Media tab.   Follow up: Patient should receive medications received by [facility] within 10 business days. Will follow up with patient in 1 business days to confirm patient has received medications and to explain any applicable refill process.   Telephone follow up appointment with CCM team member scheduled for: within 10 business days  Goals Addressed            This Visit's Progress   . "I need help straightening out my medicine" (pt-stated)   On track    Clinical Goal(s):  In the next 10 days, patient will gather documents needed to apply for medication assistance for her inhalers.  Interventions:  02/18/18: CCM Pharmacist and patient scheduled a face to face office visit to fill out medication assistance applications for [id number]  03/29/2058 CCM RN CM met with patient face to face and provided assistance with medication assistance application completion  03/20/18: CCM pharmacist received approval for Basaglar and Humalog from [employer].        Ruben Reason, PharmD Clinical Pharmacist [facility] [phone]

## 2018-03-25 ENCOUNTER — Other Ambulatory Visit: Payer: Self-pay | Admitting: Family Medicine

## 2018-03-25 ENCOUNTER — Other Ambulatory Visit: Payer: Self-pay | Admitting: Pharmacy Technician

## 2018-03-25 MED ORDER — MOMETASONE FURO-FORMOTEROL FUM 100-5 MCG/ACT IN AERO: 2.0000 | INHALATION_SPRAY | Freq: Two times a day (BID) | RESPIRATORY_TRACT | 2 refills | Status: DC

## 2018-03-25 NOTE — Patient Outreach (Signed)
[facility]) Care Management  03/25/2018  ALBINA GOSNEY Sep 21, 1960 [id number]   Received clarification of providers portion(s) of patient assistance application for [facility] and Proventil. Prepared to mail completed application and required documents into [employer].  Will follow up with company in 14-21 business days to check status of application.  Maud Deed Chana Bode [place] Certified Pharmacy Technician [place] Management Direct Dial:[phone]

## 2018-04-03 ENCOUNTER — Ambulatory Visit: Payer: Self-pay | Admitting: Pharmacist

## 2018-04-03 ENCOUNTER — Telehealth: Payer: Self-pay

## 2018-04-03 DIAGNOSIS — E1169 Type 2 diabetes mellitus with other specified complication: Secondary | ICD-10-CM

## 2018-04-03 DIAGNOSIS — E1142 Type 2 diabetes mellitus with diabetic polyneuropathy: Secondary | ICD-10-CM

## 2018-04-03 DIAGNOSIS — E785 Hyperlipidemia, unspecified: Secondary | ICD-10-CM

## 2018-04-03 NOTE — Chronic Care Management (AMB) (Signed)
  Chronic Care Management   Note  04/03/2018 Name: Tricia [name] MRN: [id number] DOB: [birth year]  Outreach call to patient today to confirm pick up of Basaglar and Humalog and to update patient on remaining medication assistance applications.   Was unable to reach patient via telephone today and have left HIPAA compliant message with [name] partner asking patient to return my call. (unsuccessful outreach #1).    Follow up plan: The CM team will reach out to the patient again over the next 7 days.   Ruben Reason, PharmD Clinical Pharmacist [facility] [phone]

## 2018-04-07 ENCOUNTER — Other Ambulatory Visit: Payer: Self-pay | Admitting: Family Medicine

## 2018-04-10 ENCOUNTER — Telehealth: Payer: Self-pay

## 2018-04-17 ENCOUNTER — Telehealth: Payer: Self-pay

## 2018-04-18 ENCOUNTER — Ambulatory Visit: Payer: Medicare PPO | Admitting: Pharmacist

## 2018-04-18 DIAGNOSIS — J441 Chronic obstructive pulmonary disease with (acute) exacerbation: Secondary | ICD-10-CM

## 2018-04-18 DIAGNOSIS — F17200 Nicotine dependence, unspecified, uncomplicated: Secondary | ICD-10-CM

## 2018-04-18 NOTE — Patient Instructions (Signed)
Goals Addressed            This Visit's Progress   . "I need help straightening out my medicine" (pt-stated)       Clinical Goal(s):  In the next 10 days, patient will gather documents needed to apply for medication assistance for her inhalers.  Interventions:  04/18/18: 1 month follow up with CCM pharmacist, need to redo [place] application, scheduled appointment with patient following upcoming office visit with Dr. [name]  See past updates to see more information.       The patient verbalized understanding of instructions provided today and declined a print copy of patient instruction materials.

## 2018-04-18 NOTE — Chronic Care Management (AMB) (Signed)
  Chronic Care Management   Note  04/18/2018 Name: Tricia [name] MRN: [id number] DOB: [birth year]  Subjective: One month follow up on medication assistance plan for [name]. HIPAA identifiers verified.  Noted earlier this week that [name] had not signed new [employer] application left at sign in desk for her (see prior notes). [name] has an upcoming appointment with Dr. [name], will review medication assistance applications with her at that time.   #[employer] Environmental health practitioner and Humalog): approved until 02/19/19 #[employer] ([place], Proventil): submitted 03/25/18, have not received decision notification from [place] (Spiriva): need to resubmit application per BI due to an error on application  Goals Addressed            This Visit's Progress   . "I need help straightening out my medicine" (pt-stated)       Clinical Goal(s):  In the next 10 days, patient will gather documents needed to apply for medication assistance for her inhalers.  Interventions:  04/18/18: 1 month follow up with CCM pharmacist, need to redo [place] application, scheduled appointment with patient following upcoming office visit with Dr. [name]  See past updates to see more information.        Follow up plan: Face to Face appointment with CCM team member scheduled foR:  04/28/18  Ruben Reason, PharmD Clinical Pharmacist [place] [facility] [phone]

## 2018-04-23 DIAGNOSIS — I872 Venous insufficiency (chronic) (peripheral): Secondary | ICD-10-CM | POA: Diagnosis not present

## 2018-04-23 DIAGNOSIS — J449 Chronic obstructive pulmonary disease, unspecified: Secondary | ICD-10-CM | POA: Diagnosis not present

## 2018-04-23 DIAGNOSIS — I10 Essential (primary) hypertension: Secondary | ICD-10-CM | POA: Diagnosis not present

## 2018-04-23 DIAGNOSIS — J984 Other disorders of lung: Secondary | ICD-10-CM | POA: Diagnosis not present

## 2018-04-23 DIAGNOSIS — I5032 Chronic diastolic (congestive) heart failure: Secondary | ICD-10-CM | POA: Diagnosis not present

## 2018-04-23 DIAGNOSIS — J4540 Moderate persistent asthma, uncomplicated: Secondary | ICD-10-CM | POA: Diagnosis not present

## 2018-04-23 DIAGNOSIS — R0602 Shortness of breath: Secondary | ICD-10-CM | POA: Diagnosis not present

## 2018-04-24 ENCOUNTER — Other Ambulatory Visit: Payer: Self-pay | Admitting: Pharmacy Technician

## 2018-04-24 NOTE — Patient Outreach (Signed)
[facility] [facility]) Care Management  04/24/2018  Tricia [name] 07/31/1960 [id number]    Follow up call placed to [employer] regarding patient assistance application(s) for [facility] and Proventil HFA , Irma confirms application has been received. Attestation form mailed out to patient on 2/28.  Will route note to [place] to inform.  Follow up:  Will collaborate with Almyra Free in regards to follow up needed for [employer].  Maud Deed Chana Bode [place] Certified Pharmacy Technician [place] Management Direct Dial:[phone]

## 2018-04-25 ENCOUNTER — Telehealth: Payer: Self-pay | Admitting: Family Medicine

## 2018-04-25 NOTE — Telephone Encounter (Signed)
That will be fine, as long as Ms. Sane understands that will delay her ability to enroll in medication assistance programs. I will call her back after 1pm.   Ruben Reason, PharmD Clinical Pharmacist [facility] [phone]

## 2018-04-25 NOTE — Telephone Encounter (Signed)
Copied from [place] [phone]. Topic: Quick Communication - See Telephone Encounter >> Apr 25, 2018 11:48 AM Blase Mess A wrote: CRM for notification. See Telephone encounter for: 04/25/18.  Patient is calling for Almyra Free. To see if her appt can be rescheduled to 05/21/18 after her 2:20p appt. No appt to offer the patient. Please advise. [phone]

## 2018-04-28 ENCOUNTER — Ambulatory Visit: Payer: Medicare PPO | Admitting: Family Medicine

## 2018-04-28 ENCOUNTER — Ambulatory Visit: Payer: Self-pay

## 2018-05-08 ENCOUNTER — Other Ambulatory Visit: Payer: Self-pay | Admitting: Family Medicine

## 2018-05-08 ENCOUNTER — Other Ambulatory Visit: Payer: Self-pay | Admitting: Pharmacy Technician

## 2018-05-08 DIAGNOSIS — J411 Mucopurulent chronic bronchitis: Secondary | ICD-10-CM

## 2018-05-08 NOTE — Patient Outreach (Signed)
[place]) Care Management  05/08/2018  Tricia [name] September 12, 1960 [id number]    Follow up call placed to [employer] regarding patient assistance application(s) for [facility] and Proventil HFA , Herbie [place] confirms patient has been approved as of 3/17 until 02/19/2019. Medication to arrive at patient in home in 10-14 business days from approval date.  Follow up:  Informed [facility] RPh Ruben Reason of approval.  [employer] and [employer] patient assistance have been completed, Will remove myself from care team.  Maud Deed. Chana Bode [place] Certified Pharmacy Technician [place] Management Direct [phone]

## 2018-05-08 NOTE — Telephone Encounter (Signed)
Refill request for general medication. Ventolin to [facility]   Last office visit 03/19/2018   Follow up on 05/21/2018

## 2018-05-20 ENCOUNTER — Encounter: Payer: Self-pay | Admitting: Family Medicine

## 2018-05-21 ENCOUNTER — Ambulatory Visit (INDEPENDENT_AMBULATORY_CARE_PROVIDER_SITE_OTHER): Payer: Medicare PPO | Admitting: Family Medicine

## 2018-05-21 ENCOUNTER — Ambulatory Visit: Payer: Self-pay

## 2018-05-21 ENCOUNTER — Encounter: Payer: Self-pay | Admitting: Family Medicine

## 2018-05-21 DIAGNOSIS — E1169 Type 2 diabetes mellitus with other specified complication: Secondary | ICD-10-CM | POA: Diagnosis not present

## 2018-05-21 DIAGNOSIS — F331 Major depressive disorder, recurrent, moderate: Secondary | ICD-10-CM

## 2018-05-21 DIAGNOSIS — E785 Hyperlipidemia, unspecified: Secondary | ICD-10-CM

## 2018-05-21 MED ORDER — VENLAFAXINE HCL ER 75 MG PO CP24: 225.0000 mg | ORAL_CAPSULE | Freq: Every day | ORAL | 0 refills | Status: DC

## 2018-05-21 NOTE — Progress Notes (Signed)
Name: Tricia [name]   MRN: [id number]    DOB: 1960/03/24   Date:05/21/2018       Progress Note  Subjective  Chief Complaint  Chief Complaint  Patient presents with  . Follow-up    1 month F/U  . Depression    Doing well with increased dose of Effexor 225 mg every morning     I connected with@ on 05/21/18 at  2:20 PM EDT by telephone and verified that I am speaking with the correct person using two identifiers.  I discussed the limitations, risks, security and privacy concerns of performing an evaluation and management service by telephone and the availability of in person appointments. Staff also discussed with the patient that there may be a patient responsible charge related to this service. Patient Location: at home Provider Location: [facility]  HPI  Major Depression: chronic and recurrent, phq 9 is higher. She lives with boyfriend and he has DM with diabetic foot ulcer and heart failure, she also worries about her daughter's the youngest one has multiple medical problems, also has a daughter in [place] that is a drug addict and alcoholic but she finally decided to let her go and is causing less stressed about that now. She is on disability , struggling financially, cannot afford seeing psychiatrist of therapist. We increased dose of Effexor 225 mg daily and she is doing better now, phq 9 has improved   DMII: she states glucose is slightly better, now in the high 190's , but she has gained weight and is not eating healthy food, referral placed to endocrinologist last visit but she has not called them back   Daughter is waiting for COVID-19 results, she was exposed to her 5 days ago. She states she has a chronic cough from COPD, but no fever, she has some nasal congestion , explained to practice quarantine for 14 days from the last day she saw her daughter, if test negative she still needs to practice social isolation    Patient Active Problem List   Diagnosis  Date Noted  . Benign neoplasm of descending colon   . Polyp of sigmoid colon   . 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 Polyneuropathy 10/21/2014  . Chronic venous insufficiency 10/05/2014  . Bilateral leg edema 08/16/2014  . Major depression in partial remission ([place]) 08/16/2014  . Acid reflux 08/16/2014  . 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 Asthma, moderate persistent 08/16/2014  . Carpal tunnel syndrome 08/16/2014  . Cervical pain 08/16/2014  . CAFL (chronic airflow limitation) ([place]) 08/16/2014  . Type 2 diabetes mellitus with peripheral neuropathy ([place]) 08/16/2014  . Diabetes mellitus type 2, insulin dependent ([place]) 08/16/2014  . Dyslipidemia 08/16/2014  . Current smoker 08/16/2014  . Essential (primary) hypertension 08/16/2014  . Benign neoplasm of stomach 08/16/2014  . Gout 08/16/2014  . HLD (hyperlipidemia) 08/16/2014  . Low back pain 08/16/2014  . Lumbar radiculopathy 08/16/2014  . Headache, migraine 08/16/2014  . Arthralgia of multiple joints 08/16/2014  . Avitaminosis D 08/16/2014  . Primary osteoarthritis of both knees 06/22/2014  . Benign essential tremor 10/02/2013  . Cervical dystonia 10/02/2013  . Chronic diastolic heart failure ([place]) 11/16/2012  . Chronic pain 11/13/2012    Past Surgical History:  Procedure Laterality Date  . CARPAL TUNNEL RELEASE Bilateral    x2 right, 1x on left  . COLONOSCOPY    . COLONOSCOPY WITH PROPOFOL N/A 04/25/2017   Procedure: COLONOSCOPY WITH PROPOFOL;  Surgeon: [name], MD;  Location:  [place];  Service: Endoscopy;  Laterality: N/A;  diabetic-oral med  . DILATION AND CURETTAGE OF UTERUS    . EXTERNAL EAR SURGERY Left    x2  . POLYPECTOMY  04/25/2017   Procedure: POLYPECTOMY INTESTINAL;  Surgeon: [name], MD;  Location: [place];  Service: Endoscopy;;  . SPINE SURGERY     herniated disc  . TUBAL LIGATION      Family History  Problem Relation Age of Onset  . Emphysema Mother    . Stroke Father   . Throat cancer Father   . Multiple sclerosis Daughter   . Bipolar disorder Daughter   . Cervical cancer Daughter   . Bipolar disorder Daughter     Social History   Socioeconomic History  . Marital status: Divorced    Spouse name: Not on file  . Number of children: 2  . Years of education: Not on file  . Highest education level: Associate degree: academic program  Occupational History  . Occupation: Disability  Social Needs  . Financial resource strain: Very hard  . Food insecurity:    Worry: Sometimes true    Inability: Sometimes true  . Transportation needs:    Medical: No    Non-medical: No  Tobacco Use  . Smoking status: Current Every Day Smoker    Packs/day: 1.00    Years: 41.00    Pack years: 41.00    Types: Cigarettes    Start date: 05/19/1977  . Smokeless tobacco: Never Used  Substance and Sexual Activity  . Alcohol use: No    Alcohol/week: 0.0 standard drinks  . Drug use: No  . Sexual activity: Not Currently  Lifestyle  . Physical activity:    Days per week: 0 days    Minutes per session: 0 min  . Stress: Very much  Relationships  . Social connections:    Talks on phone: Three times a week    Gets together: Once a week    Attends religious service: Never    Active member of club or organization: No    Attends meetings of clubs or organizations: Never    Relationship status: Divorced  . Intimate partner violence:    Fear of current or ex partner: No    Emotionally abused: No    Physically abused: No    Forced sexual activity: No  Other Topics Concern  . Not on file  Social History Narrative   Lives with her boyfriend, she has two daughters. Her youngest daughter  has multiple medical problems - used to do drugs and is now very sick, oldest lives in [place] still an alcoholic and drug addict.  Pt's boyfriend had leg amputation and she has to care for him.      Current Outpatient Medications:  .  ALPRAZolam (XANAX) 0.5 MG tablet,  Take 1 tablet by mouth 2 (two) times daily as needed. , Disp: , Rfl:  .  blood glucose meter kit and supplies KIT, Dispense accuchek aviva plus; can change if needed e11.9 (Patient taking differently: Dispense Prodigy; can change if needed e11.9), Disp: 1 each, Rfl: 0 .  calcium carbonate (TUMS EX) 750 MG chewable tablet, Chew 2 tablets by mouth as needed for heartburn., Disp: , Rfl:  .  cetirizine (ZYRTEC) 10 MG tablet, Take 10 mg by mouth as needed for allergies., Disp: , Rfl:  .  colchicine (COLCRYS) 0.6 MG tablet, , Disp: , Rfl:  .  furosemide (LASIX) 40 MG tablet, Take 1 tablet (40  mg total) by mouth daily as needed., Disp: 30 tablet, Rfl: 2 .  gabapentin (NEURONTIN) 600 MG tablet, Take 1 tablet (600 mg total) by mouth 3 (three) times daily., Disp: 90 tablet, Rfl: 3 .  glucose blood test strip, Use as directed to check blood glucose daily, Disp: 100 each, Rfl: 2 .  Insulin Glargine (BASAGLAR KWIKPEN) 100 UNIT/ML SOPN, Inject 0.2-0.5 mLs (20-50 Units total) into the skin daily., Disp: 15 mL, Rfl: 0 .  insulin lispro (HUMALOG) 100 UNIT/ML KiwkPen, Inject 0.04-0.1 mLs (4-10 Units total) into the skin 2 (two) times daily before lunch and supper., Disp: 15 mL, Rfl: 0 .  Insulin Pen Needle 32G X 6 MM MISC, 2 each by Does not apply route daily., Disp: 200 each, Rfl: 2 .  ipratropium-albuterol (DUONEB) 0.5-2.5 (3) MG/3ML SOLN, Inhale 3 mLs into the lungs every 6 (six) hours as needed., Disp: 360 mL, Rfl: 3 .  metFORMIN (GLUCOPHAGE-XR) 750 MG 24 hr tablet, Take 1 tablet (750 mg total) by mouth daily with breakfast., Disp: 90 tablet, Rfl: 1 .  mometasone-formoterol (DULERA) 100-5 MCG/ACT AERO, Inhale 2 puffs into the lungs 2 (two) times daily., Disp: 3 Inhaler, Rfl: 2 .  montelukast (SINGULAIR) 10 MG tablet, Take 1 tablet (10 mg total) by mouth daily. afternoon, Disp: 90 tablet, Rfl: 1 .  morphine (MS CONTIN) 15 MG 12 hr tablet, Take 1 tablet by mouth 2 (two) times daily., Disp: , Rfl:  .  naproxen  (NAPROSYN) 500 MG tablet, Take 500 mg by mouth as needed. , Disp: , Rfl:  .  oxyCODONE (ROXICODONE) 15 MG immediate release tablet, Take 15 mg by mouth every 6 (six) hours as needed for pain., Disp: , Rfl:  .  promethazine (PHENERGAN) 25 MG tablet, , Disp: , Rfl:  .  rizatriptan (MAXALT) 5 MG tablet, Take 5 mg by mouth as needed., Disp: , Rfl:  .  rosuvastatin (CRESTOR) 5 MG tablet, Take 1 tablet (5 mg total) by mouth at bedtime., Disp: 90 tablet, Rfl: 1 .  theophylline (UNIPHYL) 400 MG 24 hr tablet, Take 1 tablet by mouth daily., Disp: , Rfl:  .  tiZANidine (ZANAFLEX) 4 MG tablet, Take 1 tablet by mouth 4 (four) times daily., Disp: , Rfl:  .  topiramate (TOPAMAX) 100 MG tablet, Take 1 tablet by mouth 2 (two) times daily., Disp: , Rfl:  .  traZODone (DESYREL) 50 MG tablet, TAKE 1 TABLET BY MOUTH AT BEDTIME, Disp: 30 tablet, Rfl: 3 .  venlafaxine XR (EFFEXOR XR) 75 MG 24 hr capsule, Take 3 capsules (225 mg total) by mouth daily with breakfast., Disp: 810 capsule, Rfl: 0 .  VENTOLIN HFA 108 (90 Base) MCG/ACT inhaler, INHALE 1 PUFF BY MOUTH AS NEEDED, Disp: 18 g, Rfl: 0 .  dextromethorphan (DELSYM) 30 MG/5ML liquid, Take by mouth., Disp: , Rfl:  .  tiotropium (SPIRIVA HANDIHALER) 18 MCG inhalation capsule, Place 1 capsule into inhaler and inhale daily. pm, Disp: , Rfl:   Allergies  Allergen Reactions  . Augmentin [Amoxicillin-Pot Clavulanate] Diarrhea  . Penicillins Itching    I personally reviewed active problem list, medication list, allergies, family history with the patient/caregiver today.   ROS  Ten systems reviewed and is negative except as mentioned in HPI   Objective  Virtual encounter, vitals not obtained.  Physical Exam  Speaking in full sentences, no cough during conversation over phone call WebEx. Awake, alert.   PHQ2/9: Depression screen [facility] 2/9 05/20/2018 03/19/2018 01/30/2018 01/23/2018 10/29/2017  Decreased Interest 0  3 1 0 1  Down, Depressed, Hopeless 0 3 3 3 1   PHQ  - 2 Score 0 6 4 3 2   Altered sleeping 3 3 3 3 3   Tired, decreased energy 3 3 3 3 3   Change in appetite 0 3 0 3 2  Feeling bad or failure about yourself  0 3 3 3 1   Trouble concentrating 0 3 3 3 2   Moving slowly or fidgety/restless 0 3 3 0 0  Suicidal thoughts 0 0 0 0 0  PHQ-9 Score 6 24 19 18 13   Difficult doing work/chores Not difficult at all Somewhat difficult Extremely dIfficult - Somewhat difficult  Some recent data might be hidden   PHQ-2/9 Result is positive.    Fall Risk: Fall Risk  05/20/2018 03/19/2018 01/30/2018 01/23/2018 11/05/2017  Falls in the past year? 0 1 1 1  -  Number falls in past yr: 0 1 1 1  -  Injury with Fall? 0 0 1 0 -  Comment - - - - -  Risk for fall due to : - - - Impaired balance/gait History of fall(s);Medication side effect  Risk for fall due to: Comment - - - - -  Follow up - - - Falls prevention discussed -    Assessment & Plan  1. Moderate recurrent major depression (HCC)  - venlafaxine XR (EFFEXOR XR) 75 MG 24 hr capsule; Take 3 capsules (225 mg total) by mouth daily with breakfast.  Dispense: 810 capsule; Refill: 0  2. DM type 2 with diabetic dyslipidemia ([place])  She is using insulin, advised to call back for Endocrinologist appointment    I discussed the assessment and treatment plan with the patient. The patient was provided an opportunity to ask questions and all were answered. The patient agreed with the plan and demonstrated an understanding of the instructions.   The patient was advised to call back or seek an in-person evaluation if the symptoms worsen or if the condition fails to improve as anticipated.  I provided 21 minutes of non-face-to-face time during this encounter.  Loistine Chance, MD

## 2018-05-22 ENCOUNTER — Other Ambulatory Visit: Payer: Self-pay

## 2018-05-22 ENCOUNTER — Ambulatory Visit (INDEPENDENT_AMBULATORY_CARE_PROVIDER_SITE_OTHER): Payer: Medicare PPO | Admitting: Pharmacist

## 2018-05-22 DIAGNOSIS — F331 Major depressive disorder, recurrent, moderate: Secondary | ICD-10-CM

## 2018-05-22 DIAGNOSIS — J441 Chronic obstructive pulmonary disease with (acute) exacerbation: Secondary | ICD-10-CM | POA: Diagnosis not present

## 2018-05-22 NOTE — Patient Instructions (Signed)
Goals Addressed            This Visit's Progress   . "I need help straightening out my medicine" (pt-stated)       Current Barriers:  Marland Kitchen Knowledge Deficits related to medication regimen . Financial Barriers  Updated Pharmacist Clinical Goal(s):  Marland Kitchen Over the next 30 days, patient will demonstrate Improved medication adherence as evidenced by FBG <120 mg/dL  . Over the next 30 days, patient will collaborate with CCM pharmacist to complete [employer] medication assistance application for Spiriva  Interventions: . Collaboration with provider re: medication management  . Medication assistance application for Spiriva Partially completed 05/22/18: Medication assistance for [employer] and [employer] have been approved until 02/19/19   Patient Self Care Activities:  . Calls provider 10 days prior to needing refills on insulins or Dulera so that time is allowed for medication assistance program to ship medications  Please see past updates related to this goal by clicking on the "Past Updates" button in the selected goal          The patient verbalized understanding of instructions provided today and declined a print copy of patient instruction materials.

## 2018-05-22 NOTE — Chronic Care Management (AMB) (Signed)
  Chronic Care Management   Follow Up Note   05/22/2018 Name: Tricia [name] MRN: [id number] DOB: [birth year]  Referred by: [name], MD Reason for referral : Chronic Care Management (Medication assistance)   Tricia [name] is a 58 y.o. year old female who is a primary care patient of [name], MD. The CCM team was consulted for assistance with chronic disease management and care coordination needs.    Review of patient status, including review of consultants reports, relevant laboratory and other test results, and collaboration with appropriate care team members and the patient's provider was performed as part of comprehensive patient evaluation and provision of chronic care management services.     Assessment:  #Medication Assistance: Patient is has been approved for Dulera, Proventil made by [employer] and prescribed by Dr. [name] Approved 05/06/18 and expires 02/19/19. Patient has received medications via mail to her home. Counseled patient on refill process- making sure to order at least 10 days in advance to allow time for shipping.  Patient still needs to complete Spiriva application (planned for in person follow up with Dr. [name] on 05/21/18, but due to [place] pandemic, unable to see patients face to face. Will complete once face to face appointments are allowed per [facility], [place], and CDC.    Goals Addressed            This Visit's Progress   . "I need help straightening out my medicine" (pt-stated)       Current Barriers:  Marland Kitchen Knowledge Deficits related to medication regimen . Financial Barriers  Updated Pharmacist Clinical Goal(s):  Marland Kitchen Over the next 30 days, patient will demonstrate Improved medication adherence as evidenced by FBG <120 mg/dL  . Over the next 30 days, patient will collaborate with CCM pharmacist to complete [employer] medication assistance application for Spiriva  Interventions: . Collaboration with provider re: medication management  .  Medication assistance application for Spiriva Partially completed 05/22/18: Medication assistance for [employer] and [employer] have been approved until 02/19/19   Patient Self Care Activities:  . Calls provider 10 days prior to needing refills on insulins or Dulera so that time is allowed for medication assistance program to ship medications  Please see past updates related to this goal by clicking on the "Past Updates" button in the selected goal           The CM team will reach out to the patient again over the next 30 days.    Ruben Reason, PharmD Clinical Pharmacist [facility] [phone]

## 2018-05-26 DIAGNOSIS — M5430 Sciatica, unspecified side: Secondary | ICD-10-CM | POA: Diagnosis not present

## 2018-05-26 DIAGNOSIS — M545 Low back pain: Secondary | ICD-10-CM | POA: Diagnosis not present

## 2018-05-26 DIAGNOSIS — G894 Chronic pain syndrome: Secondary | ICD-10-CM | POA: Diagnosis not present

## 2018-05-26 DIAGNOSIS — Z79891 Long term (current) use of opiate analgesic: Secondary | ICD-10-CM | POA: Diagnosis not present

## 2018-06-09 ENCOUNTER — Other Ambulatory Visit: Payer: Self-pay | Admitting: Family Medicine

## 2018-06-09 DIAGNOSIS — J411 Mucopurulent chronic bronchitis: Secondary | ICD-10-CM

## 2018-06-09 NOTE — Telephone Encounter (Signed)
Refill request for general medication: Ventolin HFA 108 inhaler  Last office visit: 05/21/2018  Follow-ups on file. 08/26/2018

## 2018-06-12 ENCOUNTER — Encounter: Payer: Self-pay | Admitting: Family Medicine

## 2018-06-12 ENCOUNTER — Encounter: Payer: Self-pay | Admitting: Nurse Practitioner

## 2018-06-24 ENCOUNTER — Ambulatory Visit (INDEPENDENT_AMBULATORY_CARE_PROVIDER_SITE_OTHER): Payer: Medicare Other | Admitting: Pharmacist

## 2018-06-24 DIAGNOSIS — E785 Hyperlipidemia, unspecified: Secondary | ICD-10-CM | POA: Diagnosis not present

## 2018-06-24 DIAGNOSIS — E1169 Type 2 diabetes mellitus with other specified complication: Secondary | ICD-10-CM

## 2018-06-24 DIAGNOSIS — J441 Chronic obstructive pulmonary disease with (acute) exacerbation: Secondary | ICD-10-CM

## 2018-06-24 DIAGNOSIS — F331 Major depressive disorder, recurrent, moderate: Secondary | ICD-10-CM | POA: Diagnosis not present

## 2018-06-24 NOTE — Chronic Care Management (AMB) (Signed)
Chronic Care Management   Follow Up Note   06/24/2018 Name: Tricia [name] MRN: [id number] DOB: 08-Dec-1960  Referred by: [name], MD Reason for referral : Chronic Care Management (Pharmacy follow up)   Tricia [name] is a 58 y.o. year old female who is a primary care patient of [name], MD. The CCM team was consulted for assistance with chronic disease management and care coordination needs.    Review of patient status, including review of consultants reports, relevant laboratory and other test results, and collaboration with appropriate care team members and the patient's provider was performed as part of comprehensive patient evaluation and provision of chronic care management services.  Objective Medications Reviewed Today    Reviewed by Cathi Roan, [facility] (Pharmacist) on 06/24/18 at [place] List Status: <None>  Medication Order Taking? Sig Documenting Provider Last Dose Status Informant  ALPRAZolam (XANAX) 0.5 MG tablet [id number] Yes Take 1 tablet by mouth 2 (two) times daily as needed.  [name], MD Taking Active Self  blood glucose meter kit and supplies KIT [id number]  Dispense accuchek aviva plus; can change if needed e11.9  Patient taking differently:  Dispense Prodigy; can change if needed e11.9   [name], [name], NP  Active Self  calcium carbonate (TUMS EX) 750 MG chewable tablet [id number]  Chew 2 tablets by mouth as needed for heartburn. [provider]  Active Self  cetirizine (ZYRTEC) 10 MG tablet [id number]  Take 10 mg by mouth as needed for allergies. [provider]  Active Self  colchicine (COLCRYS) 0.6 MG tablet [id number]   [provider]  Active Self           Med Note Clelia Croft Dec 26, 2017  4:41 PM) Old prescription; copay is $150        Discontinued 06/24/18 1318 (Patient Preference)   furosemide (LASIX) 40 MG tablet [id number]  Take 1 tablet (40 mg total) by mouth daily as needed.  [name], NP  Active Self           Med Note [name], Garnetta Buddy Dec 26, 2017  4:42 PM)  "not urinating" (3-4x daily)  gabapentin (NEURONTIN) 600 MG tablet [id number] Yes Take 1 tablet (600 mg total) by mouth 3 (three) times daily. [name], NP Taking Active Self  glucose blood test strip [id number]  Use as directed to check blood glucose daily [name], [name], NP  Active Self  Insulin Glargine [facility] KWIKPEN) 100 UNIT/ML SOPN [id number] Yes Inject 0.2-0.5 mLs (20-50 Units total) into the skin daily. [name], MD Taking Active Self           Med Note Cathi Roan   Thu Jan 23, 2018  3:06 PM) 30unit  insulin lispro (HUMALOG) 100 UNIT/ML KiwkPen [id number] Yes Inject 0.04-0.1 mLs (4-10 Units total) into the skin 2 (two) times daily before lunch and supper. [name], MD Taking Active Self           Med Note Cathi Roan   Tue Feb 04, 2018  2:37 PM) Taking 20 units before meals- 1 or 2 meals per day  Insulin Pen Needle 32G X 6 MM MISC [id number]  2 each by Does not apply route daily. [name], MD  Active Self  ipratropium-albuterol (DUONEB) 0.5-2.5 (3) MG/3ML SOLN [id number] Yes Inhale 3 mLs into the lungs every 6 (six) hours as needed. [name], NP Taking Active Self  metFORMIN (GLUCOPHAGE-XR) 750 MG 24 hr tablet [id number] Yes Take 1 tablet (750 mg total) by mouth daily with breakfast. [name], MD Taking Active Self  mometasone-formoterol [facility]) 100-5 MCG/ACT Hollie Salk [id number]  Inhale 2 puffs into the lungs 2 (two) times daily. [name], MD  Active   montelukast (SINGULAIR) 10 MG tablet [id number] Yes Take 1 tablet (10 mg total) by mouth daily. afternoon [name], MD Taking Active   morphine (MS CONTIN) 15 MG 12 hr tablet [id number]  Take 1 tablet by mouth 2 (two) times daily. [name], MD  Active Self  naproxen (NAPROSYN) 500 MG tablet [id number]  Take 500 mg by mouth as needed.  [provider]   Active Self  oxyCODONE (ROXICODONE) 15 MG immediate release tablet [id number]  Take 15 mg by mouth every 6 (six) hours as needed for pain. [name], MD  Active Self  promethazine (PHENERGAN) 25 MG tablet [id number]   [provider]  Active   rizatriptan (MAXALT) 5 MG tablet [id number]  Take 5 mg by mouth as needed. [name], MD  Active Self           Med Note Candie Chroman Aug 16, 2014  4:11 PM) Medication taken as needed. 1 tab po at onset of symptoms, repeat x1 in 2 hours if necessary, not to exceed 50m in 24 hrs. Received from: [employer] rosuvastatin (CRESTOR) 5 MG tablet [id number] Take 1 tablet (5 mg total) by mouth at bedtime.  Patient not taking:  Reported on 06/24/2018   SSteele Sizer MD Not Taking Active   theophylline (UNIPHYL) 400 MG 24 hr tablet [id number] Take 1 tablet by mouth daily. FErby Pian MD  Active Self  tiotropium (SPIRIVA HANDIHALER) 18 MCG inhalation capsule [id number] Place 1 capsule into inhaler and inhale daily. pm FErby Pian MD  Active Self           Med Note (Candie ChromanJun 27, 2016  4:11 PM) Received from: [employer] tiZANidine (ZANAFLEX) 4 MG tablet [id number] Take 1 tablet by mouth 4 (four) times daily. DLeone Payor MD Taking Active Self  topiramate (TOPAMAX) 100 MG tablet [id number] Take 1 tablet by mouth 2 (two) times daily. SVladimir Crofts MD Taking Active Self  traZODone (DESYREL) 50 MG tablet [id number] TAKE 1 TABLET BY MOUTH AT BEDTIME SSteele Sizer MD  Active   venlafaxine XR (EFFEXOR XR) 75 MG 24 hr capsule [id number] Take 3 capsules (225 mg total) by mouth daily with breakfast. SSteele Sizer MD Taking Active   VENTOLIN HFA 108 [phone]) MCG/ACT inhaler [id number] INHALE 1 PUFF BY MOUTH AS NEEDED SSteele Sizer MD  Active           Assessment: #Medication assistance:     Goals Addressed            This Visit's Progress   . "I  guess I need to get better control of my diabetes" (pt-stated)       Current Barriers:  .Marland KitchenKnowledge Deficits related to basic Diabetes pathophysiology and self care/management . Knowledge Deficits related to medications used for management of diabetes . Difficulty obtaining or cannot afford medications . Limited Social Support  Case Manager Clinical Goal(s):  Over the next 30 days, patient will demonstrate improved adherence to prescribed treatment plan for diabetes self care/management as evidenced by:  . daily monitoring and recording of  CBG  . adherence to prescribed medication regimen   Over the next 90 days, patient will demonstrate improved adherence to prescribed treatment plan as evidenced by: lowered A1c  Interventions:  . Reviewed medications with patient and discussed importance of medication adherence . Advised patient, providing education and rationale, to check cbg daily and record, calling Dr. [name] or pharmacist for findings outside established parameters.    Patient Self Care Activities:  . Self administers oral medications as prescribed . Self administers insulin as prescribed . Checks blood sugars as prescribed and utilize hyper and hypoglycemia protocol as needed  Initial goal documentation       . "I need help straightening out my medicine" (pt-stated)       Patient reports recent BG readings in the 200s in the past 10 days, with an average of 220 mg/dL. We reviewed her goal and her DM medications. Adherence review of fill history reflects non adherence to rosuvastatin. Patient states she currently has about 2 months supply of her maintenance medicine (refilled one month ago). Refill history reflects this is correct, patient is consistent with fills per history but is not filling everything on her medication list.   Current Barriers:  Marland Kitchen Knowledge Deficits related to medication regimen . Financial Barriers  Updated Pharmacist Clinical Goal(s):  Marland Kitchen Over the next  30 days, patient will demonstrate Improved medication adherence as evidenced by FBG <120 mg/dL  . Over the next 30 days, patient will collaborate with CCM pharmacist to complete [employer] medication assistance application for Spiriva  Interventions: . Collaboration with provider re: medication management  . Medication assistance application for Spiriva Partially completed 05/22/18: Medication assistance for [employer] and [employer] have been approved until 02/19/19   Patient Self Care Activities:  . Calls provider 10 days prior to needing refills on insulins or Dulera so that time is allowed for medication assistance program to ship medications  Please see past updates related to this goal by clicking on the "Past Updates" button in the selected goal           Telephone follow up appointment with CCM team member scheduled for: 30 days with PharmD   Ruben Reason, PharmD Clinical Pharmacist [facility] [phone]

## 2018-06-25 ENCOUNTER — Other Ambulatory Visit: Payer: Self-pay | Admitting: Family Medicine

## 2018-06-25 DIAGNOSIS — G4700 Insomnia, unspecified: Secondary | ICD-10-CM

## 2018-06-25 NOTE — Telephone Encounter (Signed)
Refill request for general medication. Trazodone to [facility].   Last office visit 05/21/2018   Follow up on 07/22/2018

## 2018-07-03 DIAGNOSIS — M545 Low back pain: Secondary | ICD-10-CM | POA: Diagnosis not present

## 2018-07-03 DIAGNOSIS — M5430 Sciatica, unspecified side: Secondary | ICD-10-CM | POA: Diagnosis not present

## 2018-07-03 DIAGNOSIS — G894 Chronic pain syndrome: Secondary | ICD-10-CM | POA: Diagnosis not present

## 2018-07-03 DIAGNOSIS — Z79891 Long term (current) use of opiate analgesic: Secondary | ICD-10-CM | POA: Diagnosis not present

## 2018-07-22 ENCOUNTER — Telehealth: Payer: Self-pay

## 2018-07-25 ENCOUNTER — Ambulatory Visit: Payer: Self-pay | Admitting: Pharmacist

## 2018-07-25 ENCOUNTER — Telehealth: Payer: Self-pay

## 2018-07-25 NOTE — Chronic Care Management (AMB) (Signed)
  Chronic Care Management   Note  07/25/2018 Name: Tricia [name] MRN: [id number] DOB: [birth year]  58 y.o. year old female referred to Chronic Care Management by Dr. [name] for disease management: HF, DM, COPD. Last office visit with [name], MD was 05/21/18.   Was unable to reach patient via telephone today and have left HIPAA compliant voicemail asking patient to return my call. (unsuccessful outreach #1). Outreach today was 30 day follow up.   Follow up plan: The care management team will reach out to the patient again over the next 7 days.   Ruben Reason, PharmD Clinical Pharmacist [facility] [phone]

## 2018-07-29 ENCOUNTER — Ambulatory Visit: Payer: Self-pay | Admitting: Pharmacist

## 2018-07-29 NOTE — Chronic Care Management (AMB) (Signed)
  Chronic Care Management   Note  07/29/2018 Name: Tricia [name] MRN: [id number] DOB: October 24, 1960  58 y.o. year old female referred to Chronic Care Management by Dr. [name] for medication assistance, DM and COPD, and CHF management. Outreach today was for monthly medication follow up.   Was unable to reach patient via telephone today and have left HIPAA compliant message with patient's partner asking patient to return my call. (unsuccessful outreach #2).  Follow up plan: The care management team will reach out to the patient again over the next 5-7 days.   Ruben Reason, PharmD Clinical Pharmacist [facility] [phone]

## 2018-08-05 ENCOUNTER — Telehealth: Payer: Self-pay

## 2018-08-06 ENCOUNTER — Ambulatory Visit: Payer: Self-pay | Admitting: Pharmacist

## 2018-08-06 ENCOUNTER — Telehealth: Payer: Self-pay

## 2018-08-06 DIAGNOSIS — J441 Chronic obstructive pulmonary disease with (acute) exacerbation: Secondary | ICD-10-CM

## 2018-08-06 DIAGNOSIS — E1169 Type 2 diabetes mellitus with other specified complication: Secondary | ICD-10-CM

## 2018-08-06 DIAGNOSIS — F17200 Nicotine dependence, unspecified, uncomplicated: Secondary | ICD-10-CM

## 2018-08-06 NOTE — Chronic Care Management (AMB) (Signed)
  Chronic Care Management   Note  08/06/2018 Name: [name] MRN: [id number] DOB: 07/14/1960  58 y.o. year old female referred to Chronic Care Management team for medication assistance and chronic disease management by Dr. [name].   CCM team services are being closed due to three unsuccessful outreach attempts. Patient has been difficult to outreach in the past but this spring has been successfully engaged with CCM clinical pharmacist.   Patient has been provided CCM contact information if he/she wishes to engage with care managers in the future.   Ruben Reason, PharmD Clinical Pharmacist [facility] [phone]

## 2018-08-19 ENCOUNTER — Emergency Department
Admission: EM | Admit: 2018-08-19 | Discharge: 2018-08-19 | Disposition: A | Discharge status: Home / Self Care | Payer: Medicare Other | Attending: Student in an Organized Health Care Education/Training Program | Admitting: Student in an Organized Health Care Education/Training Program

## 2018-08-19 ENCOUNTER — Emergency Department: Payer: Medicare Other

## 2018-08-19 ENCOUNTER — Encounter: Payer: Self-pay | Admitting: Emergency Medicine

## 2018-08-19 ENCOUNTER — Other Ambulatory Visit: Payer: Self-pay

## 2018-08-19 DIAGNOSIS — I110 Hypertensive heart disease with heart failure: Secondary | ICD-10-CM | POA: Diagnosis not present

## 2018-08-19 DIAGNOSIS — Y9302 Activity, running: Secondary | ICD-10-CM | POA: Diagnosis not present

## 2018-08-19 DIAGNOSIS — E119 Type 2 diabetes mellitus without complications: Secondary | ICD-10-CM | POA: Insufficient documentation

## 2018-08-19 DIAGNOSIS — S4991XA Unspecified injury of right shoulder and upper arm, initial encounter: Secondary | ICD-10-CM | POA: Diagnosis not present

## 2018-08-19 DIAGNOSIS — Z79899 Other long term (current) drug therapy: Secondary | ICD-10-CM | POA: Diagnosis not present

## 2018-08-19 DIAGNOSIS — Y999 Unspecified external cause status: Secondary | ICD-10-CM | POA: Diagnosis not present

## 2018-08-19 DIAGNOSIS — W010XXA Fall on same level from slipping, tripping and stumbling without subsequent striking against object, initial encounter: Secondary | ICD-10-CM | POA: Diagnosis not present

## 2018-08-19 DIAGNOSIS — I5032 Chronic diastolic (congestive) heart failure: Secondary | ICD-10-CM | POA: Diagnosis not present

## 2018-08-19 DIAGNOSIS — J45909 Unspecified asthma, uncomplicated: Secondary | ICD-10-CM | POA: Diagnosis not present

## 2018-08-19 DIAGNOSIS — S8991XA Unspecified injury of right lower leg, initial encounter: Secondary | ICD-10-CM | POA: Diagnosis not present

## 2018-08-19 DIAGNOSIS — S8011XA Contusion of right lower leg, initial encounter: Secondary | ICD-10-CM | POA: Diagnosis not present

## 2018-08-19 DIAGNOSIS — Y92512 Supermarket, store or market as the place of occurrence of the external cause: Secondary | ICD-10-CM | POA: Insufficient documentation

## 2018-08-19 DIAGNOSIS — M25551 Pain in right hip: Secondary | ICD-10-CM | POA: Diagnosis not present

## 2018-08-19 DIAGNOSIS — M79601 Pain in right arm: Secondary | ICD-10-CM | POA: Diagnosis not present

## 2018-08-19 DIAGNOSIS — Y939 Activity, unspecified: Secondary | ICD-10-CM | POA: Insufficient documentation

## 2018-08-19 DIAGNOSIS — Z794 Long term (current) use of insulin: Secondary | ICD-10-CM | POA: Insufficient documentation

## 2018-08-19 DIAGNOSIS — S8001XA Contusion of right knee, initial encounter: Secondary | ICD-10-CM

## 2018-08-19 DIAGNOSIS — F17210 Nicotine dependence, cigarettes, uncomplicated: Secondary | ICD-10-CM | POA: Diagnosis not present

## 2018-08-19 DIAGNOSIS — M79604 Pain in right leg: Secondary | ICD-10-CM | POA: Diagnosis not present

## 2018-08-19 DIAGNOSIS — S79911A Unspecified injury of right hip, initial encounter: Secondary | ICD-10-CM | POA: Diagnosis not present

## 2018-08-19 MED ORDER — CYCLOBENZAPRINE HCL 10 MG PO TABS
10.0000 mg | ORAL_TABLET | Freq: Once | ORAL | Status: AC
  Administered 2018-08-19: 10 mg via ORAL
  Filled 2018-08-19: qty 1

## 2018-08-19 MED ORDER — OXYCODONE-ACETAMINOPHEN 5-325 MG PO TABS
1.0000 | ORAL_TABLET | Freq: Once | ORAL | Status: AC
  Administered 2018-08-19: 1 via ORAL
  Filled 2018-08-19: qty 1

## 2018-08-19 NOTE — ED Notes (Signed)
Patient transported to X-ray 

## 2018-08-19 NOTE — Discharge Instructions (Addendum)
Follow discharge care instructions and continue pain medication from previous prescriber.
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
## 2018-08-21 DIAGNOSIS — G894 Chronic pain syndrome: Secondary | ICD-10-CM | POA: Diagnosis not present

## 2018-08-21 DIAGNOSIS — Z79891 Long term (current) use of opiate analgesic: Secondary | ICD-10-CM | POA: Diagnosis not present

## 2018-08-21 DIAGNOSIS — M545 Low back pain: Secondary | ICD-10-CM | POA: Diagnosis not present

## 2018-08-21 DIAGNOSIS — M5430 Sciatica, unspecified side: Secondary | ICD-10-CM | POA: Diagnosis not present

## 2018-08-22 ENCOUNTER — Other Ambulatory Visit: Payer: Self-pay | Admitting: Family Medicine

## 2018-08-22 DIAGNOSIS — G4700 Insomnia, unspecified: Secondary | ICD-10-CM

## 2018-08-26 ENCOUNTER — Other Ambulatory Visit: Payer: Self-pay

## 2018-08-26 ENCOUNTER — Encounter: Payer: Self-pay | Admitting: Family Medicine

## 2018-08-26 ENCOUNTER — Telehealth: Payer: Self-pay | Admitting: Family Medicine

## 2018-08-26 ENCOUNTER — Ambulatory Visit (INDEPENDENT_AMBULATORY_CARE_PROVIDER_SITE_OTHER): Payer: Medicare Other | Admitting: Family Medicine

## 2018-08-26 VITALS — Temp 98.6°F | Ht 69.0 in | Wt 172.0 lb

## 2018-08-26 DIAGNOSIS — I10 Essential (primary) hypertension: Secondary | ICD-10-CM | POA: Diagnosis not present

## 2018-08-26 DIAGNOSIS — I5032 Chronic diastolic (congestive) heart failure: Secondary | ICD-10-CM | POA: Diagnosis not present

## 2018-08-26 DIAGNOSIS — Z794 Long term (current) use of insulin: Secondary | ICD-10-CM

## 2018-08-26 DIAGNOSIS — E119 Type 2 diabetes mellitus without complications: Secondary | ICD-10-CM | POA: Diagnosis not present

## 2018-08-26 DIAGNOSIS — F331 Major depressive disorder, recurrent, moderate: Secondary | ICD-10-CM

## 2018-08-26 DIAGNOSIS — G4700 Insomnia, unspecified: Secondary | ICD-10-CM

## 2018-08-26 DIAGNOSIS — F17200 Nicotine dependence, unspecified, uncomplicated: Secondary | ICD-10-CM

## 2018-08-26 DIAGNOSIS — J441 Chronic obstructive pulmonary disease with (acute) exacerbation: Secondary | ICD-10-CM | POA: Diagnosis not present

## 2018-08-26 DIAGNOSIS — Z1159 Encounter for screening for other viral diseases: Secondary | ICD-10-CM

## 2018-08-26 MED ORDER — TRAZODONE HCL 50 MG PO TABS: 50.0000 mg | ORAL_TABLET | Freq: Every day | ORAL | 1 refills | Status: DC

## 2018-08-26 MED ORDER — VENLAFAXINE HCL ER 225 MG PO TB24: 1.0000 | ORAL_TABLET | Freq: Every day | ORAL | 1 refills | Status: DC

## 2018-08-26 MED ORDER — MONTELUKAST SODIUM 10 MG PO TABS: 10.0000 mg | ORAL_TABLET | Freq: Every day | ORAL | 1 refills | Status: DC

## 2018-08-26 NOTE — Telephone Encounter (Signed)
Request for diabetes medication. Metformin   Last office visit pertaining to diabetes: 08/26/2018   Lab Results  Component Value Date   HGBA1C 13.7 (A) 03/19/2018      Follow up on 01/27/2019

## 2018-08-26 NOTE — Telephone Encounter (Signed)
Pt calling in:  Pt complaining of tightness in chest, fatigue, vomiting, headache, cough.  Tried Nurse Triage but hold time was over 2 minutes, skyped and per Arbie Cookey Pullins make pt aware that she needs to go to ER.  Pt advised and states she understood.  Pt wants to know what to do about her OV today with PCP.  Told her I would let office know what she had been told this morning.
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
## 2018-08-27 ENCOUNTER — Ambulatory Visit
Admission: RE | Admit: 2018-08-27 | Discharge: 2018-08-27 | Disposition: A | Discharge status: Home / Self Care | Payer: Medicare Other | Source: Ambulatory Visit | Attending: Family Medicine | Admitting: Family Medicine

## 2018-08-27 ENCOUNTER — Other Ambulatory Visit
Admission: RE | Admit: 2018-08-27 | Discharge: 2018-08-27 | Disposition: A | Discharge status: Home / Self Care | Payer: Medicare Other | Source: Ambulatory Visit | Attending: Family Medicine | Admitting: Family Medicine

## 2018-08-27 DIAGNOSIS — Z1159 Encounter for screening for other viral diseases: Secondary | ICD-10-CM | POA: Diagnosis not present

## 2018-08-27 DIAGNOSIS — Z794 Long term (current) use of insulin: Secondary | ICD-10-CM | POA: Diagnosis not present

## 2018-08-27 DIAGNOSIS — I10 Essential (primary) hypertension: Secondary | ICD-10-CM

## 2018-08-27 DIAGNOSIS — J441 Chronic obstructive pulmonary disease with (acute) exacerbation: Secondary | ICD-10-CM | POA: Diagnosis not present

## 2018-08-27 DIAGNOSIS — E119 Type 2 diabetes mellitus without complications: Secondary | ICD-10-CM | POA: Insufficient documentation

## 2018-08-27 DIAGNOSIS — R05 Cough: Secondary | ICD-10-CM | POA: Diagnosis not present

## 2018-08-27 LAB — CBC WITH DIFFERENTIAL/PLATELET
Abs Immature Granulocytes: 0.02 10*3/uL (ref 0.00–0.07)
Basophils Absolute: 0.1 10*3/uL (ref 0.0–0.1)
Basophils Relative: 1 %
Eosinophils Absolute: 0.2 10*3/uL (ref 0.0–0.5)
Eosinophils Relative: 3 %
HCT: 42.2 % (ref 36.0–46.0)
Hemoglobin: 14.1 g/dL (ref 12.0–15.0)
Immature Granulocytes: 0 %
Lymphocytes Relative: 39 %
Lymphs Abs: 2.5 10*3/uL (ref 0.7–4.0)
MCH: 31.4 pg (ref 26.0–34.0)
MCHC: 33.4 g/dL (ref 30.0–36.0)
MCV: 94 fL (ref 80.0–100.0)
Monocytes Absolute: 0.4 10*3/uL (ref 0.1–1.0)
Monocytes Relative: 7 %
Neutro Abs: 3.1 10*3/uL (ref 1.7–7.7)
Neutrophils Relative %: 50 %
Platelets: 235 10*3/uL (ref 150–400)
RBC: 4.49 MIL/uL (ref 3.87–5.11)
RDW: 12.4 % (ref 11.5–15.5)
WBC: 6.3 10*3/uL (ref 4.0–10.5)
nRBC: 0 % (ref 0.0–0.2)

## 2018-08-27 LAB — COMPREHENSIVE METABOLIC PANEL
ALT: 12 U/L (ref 0–44)
AST: 18 U/L (ref 15–41)
Albumin: 4.1 g/dL (ref 3.5–5.0)
Alkaline Phosphatase: 77 U/L (ref 38–126)
Anion gap: 11 (ref 5–15)
BUN: 17 mg/dL (ref 6–20)
CO2: 25 mmol/L (ref 22–32)
Calcium: 8.9 mg/dL (ref 8.9–10.3)
Chloride: 103 mmol/L (ref 98–111)
Creatinine, Ser: 1.04 mg/dL — ABNORMAL HIGH (ref 0.44–1.00)
GFR calc Af Amer: >60 mL/min (ref >60)
GFR calc non Af Amer: 60 mL/min — ABNORMAL LOW (ref >60)
Glucose, Bld: 119 mg/dL — ABNORMAL HIGH (ref 70–99)
Potassium: 3.9 mmol/L (ref 3.5–5.1)
Sodium: 139 mmol/L (ref 135–145)
Total Bilirubin: 0.3 mg/dL (ref 0.3–1.2)
Total Protein: 7 g/dL (ref 6.5–8.1)

## 2018-08-27 LAB — HM HEPATITIS C SCREENING LAB: HM Hepatitis Screen: NEGATIVE

## 2018-08-27 LAB — KETONES, URINE: Ketones, ur: NEGATIVE mg/dL

## 2018-08-28 ENCOUNTER — Other Ambulatory Visit: Payer: Self-pay | Admitting: Family Medicine

## 2018-08-28 ENCOUNTER — Encounter: Payer: Self-pay | Admitting: Family Medicine

## 2018-08-28 LAB — HEPATITIS C ANTIBODY: HCV Ab: <0.1 s/co ratio (ref 0.0–0.9)

## 2018-08-28 LAB — HEMOGLOBIN A1C
Hgb A1c MFr Bld: 6.7 % — ABNORMAL HIGH (ref 4.8–5.6)
Mean Plasma Glucose: 146 mg/dL

## 2018-08-28 LAB — THEOPHYLLINE LEVEL: Theophylline Lvl: 4.7 ug/mL — ABNORMAL LOW (ref 10.0–20.0)

## 2018-08-28 MED ORDER — METFORMIN HCL ER 750 MG PO TB24: 750.0000 mg | ORAL_TABLET | Freq: Every day | ORAL | 1 refills | Status: DC

## 2018-08-28 MED ORDER — PREDNISONE 10 MG PO TABS: 10.0000 mg | ORAL_TABLET | Freq: Every day | ORAL | 0 refills | Status: DC

## 2018-08-29 ENCOUNTER — Other Ambulatory Visit: Payer: Self-pay | Admitting: Family Medicine

## 2018-08-29 DIAGNOSIS — G4700 Insomnia, unspecified: Secondary | ICD-10-CM

## 2018-10-23 DIAGNOSIS — G894 Chronic pain syndrome: Secondary | ICD-10-CM | POA: Diagnosis not present

## 2018-10-23 DIAGNOSIS — M5430 Sciatica, unspecified side: Secondary | ICD-10-CM | POA: Diagnosis not present

## 2018-10-23 DIAGNOSIS — Z79891 Long term (current) use of opiate analgesic: Secondary | ICD-10-CM | POA: Diagnosis not present

## 2018-10-23 DIAGNOSIS — M545 Low back pain: Secondary | ICD-10-CM | POA: Diagnosis not present

## 2018-11-03 DIAGNOSIS — I10 Essential (primary) hypertension: Secondary | ICD-10-CM | POA: Diagnosis not present

## 2018-11-03 DIAGNOSIS — I5032 Chronic diastolic (congestive) heart failure: Secondary | ICD-10-CM | POA: Diagnosis not present

## 2018-11-03 DIAGNOSIS — I872 Venous insufficiency (chronic) (peripheral): Secondary | ICD-10-CM | POA: Diagnosis not present

## 2018-11-17 ENCOUNTER — Telehealth: Payer: Self-pay

## 2018-11-17 NOTE — Telephone Encounter (Signed)
Copied from [place] [id number]. Topic: General - Inquiry >> Nov 17, 2018 10:07 AM Virl Axe D wrote: Reason for CRM: Pt stated that [facility] told her they were scheduling a physician to come out to her home on 11/20/18 to go through her medication list and possibly other things. She is not very sure about them coming out. She stated they report back to Dr. [name]. She would like to know if Dr. [name] is aware of this. Please advise. Requesting callback

## 2018-11-18 NOTE — Telephone Encounter (Signed)
Called patient. Patient would prefer to do Medicare Wellness instead. Transferred call to front to schedule.

## 2018-12-01 ENCOUNTER — Ambulatory Visit: Payer: Self-pay

## 2018-12-01 NOTE — Telephone Encounter (Signed)
Patient called stating that her BP is low 90/55 and 109/67.  She states that she is off balance.  Her daughter is with her and states that her mother is having worsening of her tremors. Ms Milani does not take medication for BP.  She states that her BP is often low. Per protocol patient will go to ER for evaluation of her symptoms. Note will be routed to office.          Reason for Disposition . [id number] Fall in systolic BP > 20 mm Hg from normal AND [2] dizzy, lightheaded, or weak  Answer Assessment - Initial Assessment Questions 1. BLOOD PRESSURE: "What is the blood pressure?" "Did you take at least two measurements 5 minutes apart?"     90/55, 109/67 2. ONSET: "When did you take your blood pressure?"     today 3. HOW: "How did you obtain the blood pressure?" (e.g., visiting nurse, automatic home BP monitor)     Home bp machine 4. HISTORY: "Do you have a history of low blood pressure?" "What is your blood pressure normally?"     sometimes 5. MEDICATIONS: "Are you taking any medications for blood pressure?" If yes: "Have they been changed recently?"    No 6. PULSE RATE: "Do you know what your pulse rate is?"     74 7. OTHER SYMPTOMS: "Have you been sick recently?" "Have you had a recent injury?"    Off balance 8. PREGNANCY: "Is there any chance you are pregnant?" "When was your last menstrual period?"     N/A  Protocols used: LOW BLOOD PRESSURE-A-AH

## 2018-12-02 ENCOUNTER — Telehealth: Payer: Self-pay

## 2018-12-02 NOTE — Telephone Encounter (Signed)
Copied from [place] [id number]. Topic: General - Other >> Dec 02, 2018  1:09 PM Rayann Heman wrote: Reason for CRM: pt daughter called and would like a call back from the nurse to discuss patient. Daughter is very concerned and states that she taking multiple medications together. She sates that she does not want any info from chart. Pt daughter does not want pt to know that she called. Pt daughter thinks that she is abusing medication. Pt daughter would like a call back from the office as soon as possible.   Daughter is not on her DPR. Please advise.

## 2018-12-03 NOTE — Telephone Encounter (Signed)
Called daughter  She is aware

## 2018-12-23 DIAGNOSIS — M545 Low back pain: Secondary | ICD-10-CM | POA: Diagnosis not present

## 2018-12-23 DIAGNOSIS — Z79891 Long term (current) use of opiate analgesic: Secondary | ICD-10-CM | POA: Diagnosis not present

## 2018-12-23 DIAGNOSIS — Z5181 Encounter for therapeutic drug level monitoring: Secondary | ICD-10-CM | POA: Diagnosis not present

## 2018-12-23 DIAGNOSIS — M5430 Sciatica, unspecified side: Secondary | ICD-10-CM | POA: Diagnosis not present

## 2018-12-23 DIAGNOSIS — G894 Chronic pain syndrome: Secondary | ICD-10-CM | POA: Diagnosis not present

## 2019-01-09 ENCOUNTER — Other Ambulatory Visit: Payer: Self-pay | Admitting: Family Medicine

## 2019-01-09 DIAGNOSIS — J411 Mucopurulent chronic bronchitis: Secondary | ICD-10-CM

## 2019-01-09 NOTE — Telephone Encounter (Signed)
Requested medication (s) are due for refill today: yes  Requested medication (s) are on the active medication list: yes  Last refill:  12/15/2018  Future visit scheduled: yes  Notes to clinic: review for refill One inhaler should last at least one month   Requested Prescriptions  Pending Prescriptions Disp Refills   albuterol (VENTOLIN HFA) 108 (90 Base) MCG/ACT inhaler [Pharmacy Med Name: ALBUTEROL SULFATE HFA 108 (90 BASE)] 18 g 5    Sig: INHALE 1 PUFF BY MOUTH AS NEEDED     Pulmonology:  Beta Agonists Failed - 01/09/2019  1:22 PM      Failed - One inhaler should last at least one month. If the patient is requesting refills earlier, contact the patient to check for uncontrolled symptoms.      Passed - Valid encounter within last 12 months    Recent Outpatient Visits          4 months ago Diabetes mellitus type 2, insulin dependent [facility])   [facility] [place], Drue Stager, MD   7 months ago Moderate recurrent major depression [facility])   [facility] [place], Drue Stager, MD   9 months ago Type 2 diabetes mellitus with peripheral neuropathy [facility])   [facility] [name], MD   11 months ago Moderate recurrent major depression [facility])   [place], [name]   1 year ago Diabetes mellitus type 2, insulin dependent [facility])   [facility] [name], MD      Future Appointments            In 2 weeks [facility], [facility]

## 2019-01-11 ENCOUNTER — Other Ambulatory Visit: Payer: Self-pay | Admitting: Family Medicine

## 2019-01-11 DIAGNOSIS — Z794 Long term (current) use of insulin: Secondary | ICD-10-CM

## 2019-01-11 DIAGNOSIS — E119 Type 2 diabetes mellitus without complications: Secondary | ICD-10-CM

## 2019-01-11 DIAGNOSIS — G4700 Insomnia, unspecified: Secondary | ICD-10-CM

## 2019-01-21 ENCOUNTER — Ambulatory Visit: Payer: Self-pay | Admitting: Family Medicine

## 2019-01-21 ENCOUNTER — Ambulatory Visit (INDEPENDENT_AMBULATORY_CARE_PROVIDER_SITE_OTHER): Payer: Medicare Other | Admitting: Pharmacist

## 2019-01-21 DIAGNOSIS — E119 Type 2 diabetes mellitus without complications: Secondary | ICD-10-CM | POA: Diagnosis not present

## 2019-01-21 DIAGNOSIS — Z794 Long term (current) use of insulin: Secondary | ICD-10-CM | POA: Diagnosis not present

## 2019-01-21 DIAGNOSIS — J441 Chronic obstructive pulmonary disease with (acute) exacerbation: Secondary | ICD-10-CM

## 2019-01-21 NOTE — Chronic Care Management (AMB) (Signed)
  Chronic Care Management   Note  01/21/2019 Name: Tricia [name] MRN: [id number] DOB: 1960-09-21  CCM pharmacist telephone outreach to patient for 2021 medication assistance. HIPAA identifiers verified.  Set up full pharmacy appointment for Dec 9th  Goals Addressed            This Visit's Progress   . Medication assistance 2021 (pt-stated)       Current Barriers:  . financial  Pharmacist Clinical Goal(s): Over the next 14 days, Ms.Marvel Plan will provide the necessary supplementary documents (proof of out of pocket prescription expenditure, proof of household income) needed for medication assistance applications to CCM pharmacist.   Interventions: . CCM pharmacist will apply for medication assistance program for patient's insulin via [employer]  Patient Self Care Activities:  Marland Kitchen Gather necessary documents needed to apply for medication assistance  Initial goal documentation         Follow up plan: Telephone follow up appointment with care management team member scheduled for: December 9th with PharmD for full medicaiton review  Ruben Reason, PharmD Clinical Pharmacist [place] [phone]

## 2019-01-21 NOTE — Telephone Encounter (Signed)
Pt called and stated that she would like a call back from the nurse regarding chest congestion.she would like a clinical message sent to Dr [name] Please advise      Returned call to patient regarding above message. She thinks she has bronchitis andis coughing up green phlegm. It has been going on for a few days.  She denies a fever.  She stated that she gets bronchitis every year about this time. And is normally put on an antibiotic and steroid. Home care advice given regarding congestion and cough.  To drink a lot of water, use a humidifier, try honey in hot tea and also a couple of teaspoon of honey at bedtime, otc cough medication. Call her pcp for fever and feeling really bad. She voiced understanding. She is requesting a call back an appointment for tomorrow. Routing to [employer]  for review and a call back.  Reason for Disposition . [1] Known COPD or other severe lung disease (i.e., bronchiectasis, cystic fibrosis, lung surgery) AND [2] worsening symptoms (i.e., increased sputum purulence or amount, increased breathing difficulty  Answer Assessment - Initial Assessment Questions 1. ONSET: "When did the cough begin?"      The last few days 2. SEVERITY: "How bad is the cough today?"      Pretty bad 3. RESPIRATORY DISTRESS: "Describe your breathing."      Short of breath 4. FEVER: "Do you have a fever?" If so, ask: "What is your temperature, how was it measured, and when did it start?"     No fever 5. SPUTUM: "Describe the color of your sputum" (clear, white, yellow, green)     Green phlegm  6. HEMOPTYSIS: "Are you coughing up any blood?" If so ask: "How much?" (flecks, streaks, tablespoons, etc.)     No  7. CARDIAC HISTORY: "Do you have any history of heart disease?" (e.g., heart attack, congestive heart failure)      Diastolic disfunction 8. LUNG HISTORY: "Do you have any history of lung disease?"  (e.g., pulmonary embolus, asthma, emphysema)     COPD 9. PE RISK  FACTORS: "Do you have a history of blood clots?" (or: recent major surgery, recent prolonged travel, bedridden)     no 10. OTHER SYMPTOMS: "Do you have any other symptoms?" (e.g., runny nose, wheezing, chest pain)       wheezing 11. PREGNANCY: "Is there any chance you are pregnant?" "When was your last menstrual period?"       n/a 12. TRAVEL: "Have you traveled out of the country in the last month?" (e.g., travel history, exposures)       no  Protocols used: [place]

## 2019-01-22 ENCOUNTER — Encounter: Payer: Self-pay | Admitting: Family Medicine

## 2019-01-22 ENCOUNTER — Other Ambulatory Visit: Payer: Self-pay | Admitting: Pharmacy Technician

## 2019-01-22 ENCOUNTER — Other Ambulatory Visit: Payer: Self-pay

## 2019-01-22 ENCOUNTER — Ambulatory Visit (INDEPENDENT_AMBULATORY_CARE_PROVIDER_SITE_OTHER): Payer: Medicare Other | Admitting: Family Medicine

## 2019-01-22 DIAGNOSIS — J449 Chronic obstructive pulmonary disease, unspecified: Secondary | ICD-10-CM

## 2019-01-22 DIAGNOSIS — E1142 Type 2 diabetes mellitus with diabetic polyneuropathy: Secondary | ICD-10-CM

## 2019-01-22 DIAGNOSIS — J989 Respiratory disorder, unspecified: Secondary | ICD-10-CM

## 2019-01-22 DIAGNOSIS — J4541 Moderate persistent asthma with (acute) exacerbation: Secondary | ICD-10-CM

## 2019-01-22 MED ORDER — PREDNISONE 20 MG PO TABS: 20.0000 mg | ORAL_TABLET | Freq: Two times a day (BID) | ORAL | 0 refills | Status: AC

## 2019-01-22 NOTE — Progress Notes (Signed)
Name: Tricia [name]   MRN: [id number]    DOB: Oct 06, 1960   Date:01/22/2019       Progress Note  Subjective  Chief Complaint  Chief Complaint  Patient presents with  . Bronchitis    cough, congested green phelgm for 3    I connected with  [name] on 01/22/19 at  9:40 AM EST by telephone and verified that I am speaking with the correct person using two identifiers.   I discussed the limitations, risks, security and privacy concerns of performing an evaluation and management service by telephone and the availability of in person appointments. Staff also discussed with the patient that there may be a patient responsible charge related to this service. Patient Location: Home Provider Location: Office Additional Individuals present: None  HPI  Pt presents with concern for respiratory illness - she has had symptoms for about 3 days.  She has been coughing up green phlegm, sore throat, chest congestion, nasal congestion, shortness of breath.  She is a current smoker about 1ppd, has asthma and COPD.  She is using Symbicort + Spiriva, and theophylline;using ventolin PRN.  No known COVID-19 exposure that she is aware.  Denies fevers/chills, chest pain, abnormal headache (has history migraine), abnormal pain (she has ongoing body aches).    Diabetes: She has been seeing FBS 110-120.  Discussed prednisone - if BG's go above 200, she will call our office.  Discussed sick day care in detail, continue diabetes medications.   Patient Active Problem List   Diagnosis Date Noted  . Benign neoplasm of descending colon   . Polyp of sigmoid colon   . 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 Polyneuropathy 10/21/2014  . Chronic venous insufficiency 10/05/2014  . Bilateral leg edema 08/16/2014  . Major depression in partial remission ([place]) 08/16/2014  . Acid reflux 08/16/2014  . 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 Asthma, moderate persistent 08/16/2014  . Carpal  tunnel syndrome 08/16/2014  . Cervical pain 08/16/2014  . CAFL (chronic airflow limitation) ([place]) 08/16/2014  . Type 2 diabetes mellitus with peripheral neuropathy ([place]) 08/16/2014  . Diabetes mellitus type 2, insulin dependent ([place]) 08/16/2014  . Dyslipidemia 08/16/2014  . Current smoker 08/16/2014  . Essential (primary) hypertension 08/16/2014  . Benign neoplasm of stomach 08/16/2014  . Gout 08/16/2014  . HLD (hyperlipidemia) 08/16/2014  . Low back pain 08/16/2014  . Lumbar radiculopathy 08/16/2014  . Headache, migraine 08/16/2014  . Arthralgia of multiple joints 08/16/2014  . Avitaminosis D 08/16/2014  . Primary osteoarthritis of both knees 06/22/2014  . Benign essential tremor 10/02/2013  . Cervical dystonia 10/02/2013  . Chronic diastolic heart failure ([place]) 11/16/2012  . Chronic pain 11/13/2012    Social History   Tobacco Use  . Smoking status: Current Every Day Smoker    Packs/day: 1.00    Years: 41.00    Pack years: 41.00    Types: Cigarettes    Start date: 05/19/1977  . Smokeless tobacco: Never Used  Substance Use Topics  . Alcohol use: No    Alcohol/week: 0.0 standard drinks     Current Outpatient Medications:  .  albuterol (VENTOLIN HFA) 108 (90 Base) MCG/ACT inhaler, INHALE 1 PUFF BY MOUTH AS NEEDED, Disp: 18 g, Rfl: 5 .  ALPRAZolam (XANAX) 0.5 MG tablet, Take 1 tablet by mouth 2 (two) times daily as needed. , Disp: , Rfl:  .  budesonide-formoterol (SYMBICORT) 160-4.5 MCG/ACT inhaler, Inhale into the lungs., Disp: , Rfl:  .  calcium carbonate (  TUMS EX) 750 MG chewable tablet, Chew 2 tablets by mouth as needed for heartburn., Disp: , Rfl:  .  cetirizine (ZYRTEC) 10 MG tablet, Take 10 mg by mouth as needed for allergies., Disp: , Rfl:  .  colchicine (COLCRYS) 0.6 MG tablet, , Disp: , Rfl:  .  furosemide (LASIX) 40 MG tablet, Take 1 tablet (40 mg total) by mouth daily as needed., Disp: 30 tablet, Rfl: 2 .  gabapentin (NEURONTIN) 600 MG tablet, Take 1 tablet (600  mg total) by mouth 3 (three) times daily., Disp: 90 tablet, Rfl: 3 .  Insulin Glargine (BASAGLAR KWIKPEN) 100 UNIT/ML SOPN, Inject 0.2-0.5 mLs (20-50 Units total) into the skin daily., Disp: 15 mL, Rfl: 0 .  insulin lispro (HUMALOG) 100 UNIT/ML KiwkPen, Inject 0.04-0.1 mLs (4-10 Units total) into the skin 2 (two) times daily before lunch and supper., Disp: 15 mL, Rfl: 0 .  ipratropium-albuterol (DUONEB) 0.5-2.5 (3) MG/3ML SOLN, Inhale 3 mLs into the lungs every 6 (six) hours as needed., Disp: 360 mL, Rfl: 3 .  metFORMIN (GLUCOPHAGE-XR) 750 MG 24 hr tablet, TAKE 1 TABLET BY MOUTH  DAILY WITH BREAKFAST, Disp: 90 tablet, Rfl: 0 .  mometasone-formoterol (DULERA) 100-5 MCG/ACT AERO, Inhale 2 puffs into the lungs 2 (two) times daily., Disp: 3 Inhaler, Rfl: 2 .  montelukast (SINGULAIR) 10 MG tablet, Take 1 tablet (10 mg total) by mouth daily. afternoon, Disp: 90 tablet, Rfl: 1 .  morphine (MS CONTIN) 15 MG 12 hr tablet, Take 1 tablet by mouth 2 (two) times daily., Disp: , Rfl:  .  naproxen (NAPROSYN) 500 MG tablet, Take 500 mg by mouth as needed. , Disp: , Rfl:  .  oxyCODONE (ROXICODONE) 15 MG immediate release tablet, Take 15 mg by mouth every 6 (six) hours as needed for pain., Disp: , Rfl:  .  predniSONE (DELTASONE) 10 MG tablet, Take 1 tablet (10 mg total) by mouth daily with breakfast. Take 2 in am with breakfast and one at 5 pm with a meal, Disp: 15 tablet, Rfl: 0 .  promethazine (PHENERGAN) 25 MG tablet, , Disp: , Rfl:  .  RELISTOR 150 MG TABS, , Disp: , Rfl:  .  rizatriptan (MAXALT) 5 MG tablet, Take 5 mg by mouth as needed., Disp: , Rfl:  .  rosuvastatin (CRESTOR) 5 MG tablet, Take 1 tablet (5 mg total) by mouth at bedtime., Disp: 90 tablet, Rfl: 1 .  theophylline (UNIPHYL) 400 MG 24 hr tablet, Take 1 tablet by mouth daily., Disp: , Rfl:  .  tiotropium (SPIRIVA HANDIHALER) 18 MCG inhalation capsule, Place 1 capsule into inhaler and inhale daily. pm, Disp: , Rfl:  .  tiZANidine (ZANAFLEX) 4 MG  tablet, Take 1 tablet by mouth 4 (four) times daily., Disp: , Rfl:  .  topiramate (TOPAMAX) 100 MG tablet, Take 1 tablet by mouth 2 (two) times daily., Disp: , Rfl:  .  traZODone (DESYREL) 50 MG tablet, TAKE 1 TABLET BY MOUTH AT  BEDTIME, Disp: 90 tablet, Rfl: 0 .  Venlafaxine HCl 225 MG TB24, Take 1 tablet (225 mg total) by mouth daily., Disp: 90 tablet, Rfl: 1 .  blood glucose meter kit and supplies KIT, Dispense accuchek aviva plus; can change if needed e11.9 (Patient not taking: Reported on 01/22/2019), Disp: 1 each, Rfl: 0 .  glucose blood test strip, Use as directed to check blood glucose daily (Patient not taking: Reported on 01/22/2019), Disp: 100 each, Rfl: 2 .  Insulin Pen Needle 32G X 6 MM MISC,  2 each by Does not apply route daily. (Patient not taking: Reported on 01/22/2019), Disp: 200 each, Rfl: 2  Allergies  Allergen Reactions  . Augmentin [Amoxicillin-Pot Clavulanate] Diarrhea  . Penicillins Itching    I personally reviewed active problem list, medication list, allergies, health maintenance, notes from last encounter, lab results with the patient/caregiver today.  ROS  Ten systems reviewed and is negative except as mentioned in HPI  Objective  Virtual encounter, vitals not obtained.  There is no height or weight on file to calculate BMI.  Nursing Note and Vital Signs reviewed.  Physical Exam   Pulmonary/Chest: Effort normal. No respiratory distress. Speaking in complete sentences Neurological: Pt is alert and oriented to person, place, and time. Speech is normal Psychiatric: Patient has a normal mood and affect. behavior is normal. Judgment and thought content normal.  No results found for this or any previous visit (from the past 72 hour(s)).  Assessment & Plan  1. Moderate persistent asthma with acute exacerbation - Novel Coronavirus, NAA ([facility]) - predniSONE (DELTASONE) 20 MG tablet; Take 1 tablet (20 mg total) by mouth 2 (two) times daily with a meal for 5  days.  Dispense: 10 tablet; Refill: 0  2. CAFL (chronic airflow limitation) ([facility]) - Novel Coronavirus, NAA ([facility]) - predniSONE (DELTASONE) 20 MG tablet; Take 1 tablet (20 mg total) by mouth 2 (two) times daily with a meal for 5 days.  Dispense: 10 tablet; Refill: 0  3. Respiratory illness - Novel Coronavirus, NAA ([facility]) - predniSONE (DELTASONE) 20 MG tablet; Take 1 tablet (20 mg total) by mouth 2 (two) times daily with a meal for 5 days.  Dispense: 10 tablet; Refill: 0  4. Type 2 diabetes mellitus with peripheral neuropathy ([facility]) - Discussed sick day care in detail.  Will monitor BG's more closely while taking prednisone.  If spiking >200, will call our office for instructions.  -Red flags and when to present for emergency care or RTC including fever >101.95F, chest pain, shortness of breath, new/worsening/un-resolving symptoms, reviewed with patient at time of visit. Follow up and care instructions discussed and provided in AVS. - I discussed the assessment and treatment plan with the patient. The patient was provided an opportunity to ask questions and all were answered. The patient agreed with the plan and demonstrated an understanding of the instructions.  - The patient was advised to call back or seek an in-person evaluation if the symptoms worsen or if the condition fails to improve as anticipated.  I provided 18 minutes of non-face-to-face time during this encounter.  [name], [name]

## 2019-01-22 NOTE — Patient Outreach (Addendum)
[facility]) Care Management  01/22/2019  Tricia [name] 11/06/1960 [id number]   Received request from [place] to mail patient and provider portion of [employer] application to patient.  Maud Deed Chana Bode [place] Certified Pharmacy Technician [place] Management Direct [phone]

## 2019-01-27 ENCOUNTER — Ambulatory Visit (INDEPENDENT_AMBULATORY_CARE_PROVIDER_SITE_OTHER): Payer: Medicare Other

## 2019-01-27 ENCOUNTER — Other Ambulatory Visit: Payer: Self-pay

## 2019-01-27 DIAGNOSIS — Z594 Lack of adequate food and safe drinking water: Secondary | ICD-10-CM

## 2019-01-27 DIAGNOSIS — Z0000 Encounter for general adult medical examination without abnormal findings: Secondary | ICD-10-CM

## 2019-01-27 DIAGNOSIS — Z5941 Food insecurity: Secondary | ICD-10-CM

## 2019-01-27 NOTE — Progress Notes (Signed)
Subjective:   Tricia [name] is a 58 y.o. female who presents for Medicare Annual (Subsequent) preventive examination.  Virtual Visit via Telephone Note  I connected with [name] on 01/27/19 at  1:30 PM EST by telephone and verified that I am speaking with the correct person using two identifiers.  Medicare Annual Wellness visit completed telephonically due to Covid-19 pandemic.   Location: Patient: home Provider: office   I discussed the limitations, risks, security and privacy concerns of performing an evaluation and management service by telephone and the availability of in person appointments. The patient expressed understanding and agreed to proceed.  Some vital signs may be absent or patient reported.   Clemetine Marker, LPN    Review of Systems:   Cardiac Risk Factors include: smoking/ tobacco exposure;dyslipidemia;hypertension;sedentary lifestyle;diabetes mellitus     Objective:     Vitals: There were no vitals taken for this visit.  There is no height or weight on file to calculate BMI.  Advanced Directives 01/27/2019 01/23/2018 04/25/2017 01/01/2017 12/19/2016 09/18/2016 06/19/2016  Does Patient Have a Medical Advance Directive? No No No No No No No  Would patient like information on creating a medical advance directive? Yes (MAU/Ambulatory/Procedural Areas - Information given) Yes (MAU/Ambulatory/Procedural Areas - Information given) No - Patient declined Yes (MAU/Ambulatory/Procedural Areas - Information given) - - -    Tobacco Social History   Tobacco Use  Smoking Status Current Every Day Smoker  . Packs/day: 1.00  . Years: 41.00  . Pack years: 41.00  . Types: Cigarettes  . Start date: 05/19/1977  Smokeless Tobacco Never Used  Tobacco Comment   info given for 1-800 QUIT NOW     Ready to quit: Yes Counseling given: Yes Comment: info given for 1-800 QUIT NOW   Clinical Intake:  Pre-visit preparation completed: Yes  Pain : 0-10 Pain  Score: 7  Pain Type: Chronic pain Pain Location: Back(knees) Pain Orientation: Left, Right, Lower Pain Descriptors / Indicators: Sore, Throbbing, Aching Pain Onset: More than a month ago Pain Frequency: Constant     Nutritional Risks: Nausea/ vomitting/ diarrhea Diabetes: Yes CBG done?: No Did pt. bring in CBG monitor from home?: No  How often do you need to have someone help you when you read instructions, pamphlets, or other written materials from your doctor or pharmacy?: 1 - Never  Interpreter Needed?: No  Information entered by :: Clemetine Marker LPN  Past Medical History:  Diagnosis Date  . Anxiety   . Arthritis    joints and hands/ knees  . Asthma    uses inhaler  . Benign essential tremor    head  . Cervical dystonia    neck pain  . Cholesteatoma of left ear    x2  . COPD (chronic obstructive pulmonary disease) ([facility])   . Cough   . Depression   . Diabetes mellitus without complication ([facility])    type 2  . Diastolic dysfunction   . Dyspnea   . Dysrhythmia    diastolic dysfunction  . GERD (gastroesophageal reflux disease)   . Headache    migraines/ one per week  . 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 Hyperlipidemia   . Hypertension   . Motion sickness    boat  . Neuromuscular disorder ([facility])    neuropathy feet and hands( nerve damage)  . Wears dentures    upper and lower   Past Surgical History:  Procedure Laterality Date  . CARPAL TUNNEL  RELEASE Bilateral    x2 right, 1x on left  . COLONOSCOPY    . COLONOSCOPY WITH PROPOFOL N/A 04/25/2017   Procedure: COLONOSCOPY WITH PROPOFOL;  Surgeon: [name], MD;  Location: [facility];  Service: Endoscopy;  Laterality: N/A;  diabetic-oral med  . DILATION AND CURETTAGE OF UTERUS    . EXTERNAL EAR SURGERY Left    x2  . POLYPECTOMY  04/25/2017   Procedure: POLYPECTOMY INTESTINAL;  Surgeon: [name], MD;  Location: [facility];  Service: Endoscopy;;  . SPINE SURGERY      herniated disc  . TUBAL LIGATION     Family History  Problem Relation Age of Onset  . Emphysema Mother   . Stroke Father   . Throat cancer Father   . Multiple sclerosis Daughter   . Bipolar disorder Daughter   . Cervical cancer Daughter   . Bipolar disorder Daughter   . Lung cancer Maternal Aunt   . Lung cancer Maternal Uncle   . Lung cancer Maternal Grandmother   . Lung cancer Maternal Grandfather    Social History   Socioeconomic History  . Marital status: Divorced    Spouse name: Not on file  . Number of children: 2  . Years of education: Not on file  . Highest education level: Associate degree: academic program  Occupational History  . Occupation: Disability  Social Needs  . Financial resource strain: Very hard  . Food insecurity    Worry: Sometimes true    Inability: Sometimes true  . Transportation needs    Medical: No    Non-medical: No  Tobacco Use  . Smoking status: Current Every Day Smoker    Packs/day: 1.00    Years: 41.00    Pack years: 41.00    Types: Cigarettes    Start date: 05/19/1977  . Smokeless tobacco: Never Used  . Tobacco comment: info given for 1-800 QUIT NOW  Substance and Sexual Activity  . Alcohol use: No    Alcohol/week: 0.0 standard drinks  . Drug use: No  . Sexual activity: Not Currently  Lifestyle  . Physical activity    Days per week: 0 days    Minutes per session: 0 min  . Stress: Very much  Relationships  . Social Herbalist on phone: Three times a week    Gets together: Once a week    Attends religious service: Never    Active member of club or organization: No    Attends meetings of clubs or organizations: Never    Relationship status: Living with partner  Other Topics Concern  . Not on file  Social History Narrative   Lives with her boyfriend, she has two daughters. Her youngest daughter  has multiple medical problems - used to do drugs and is now very sick, oldest lives in [place] still an alcoholic and drug  addict.  Pt's boyfriend had leg amputation and she has to care for him.     Outpatient Encounter Medications as of 01/27/2019  Medication Sig  . albuterol (VENTOLIN HFA) 108 (90 Base) MCG/ACT inhaler INHALE 1 PUFF BY MOUTH AS NEEDED  . ALPRAZolam (XANAX) 0.5 MG tablet Take 1 tablet by mouth 2 (two) times daily as needed.   . blood glucose meter kit and supplies KIT Dispense accuchek aviva plus; can change if needed e11.9  . budesonide-formoterol (SYMBICORT) 160-4.5 MCG/ACT inhaler Inhale into the lungs.  . calcium carbonate (TUMS EX) 750 MG chewable tablet Chew 2 tablets by  mouth as needed for heartburn.  . cetirizine (ZYRTEC) 10 MG tablet Take 10 mg by mouth as needed for allergies.  . furosemide (LASIX) 40 MG tablet Take 1 tablet (40 mg total) by mouth daily as needed.  . gabapentin (NEURONTIN) 600 MG tablet Take 1 tablet (600 mg total) by mouth 3 (three) times daily.  Marland Kitchen glucose blood test strip Use as directed to check blood glucose daily  . Insulin Glargine (BASAGLAR KWIKPEN) 100 UNIT/ML SOPN Inject 0.2-0.5 mLs (20-50 Units total) into the skin daily.  . insulin lispro (HUMALOG) 100 UNIT/ML KiwkPen Inject 0.04-0.1 mLs (4-10 Units total) into the skin 2 (two) times daily before lunch and supper.  . Insulin Pen Needle 32G X 6 MM MISC 2 each by Does not apply route daily.  Marland Kitchen ipratropium-albuterol (DUONEB) 0.5-2.5 (3) MG/3ML SOLN Inhale 3 mLs into the lungs every 6 (six) hours as needed.  . metFORMIN (GLUCOPHAGE-XR) 750 MG 24 hr tablet TAKE 1 TABLET BY MOUTH  DAILY WITH BREAKFAST  . montelukast (SINGULAIR) 10 MG tablet Take 1 tablet (10 mg total) by mouth daily. afternoon  . morphine (MS CONTIN) 15 MG 12 hr tablet Take 1 tablet by mouth 2 (two) times daily.  . naproxen (NAPROSYN) 500 MG tablet Take 500 mg by mouth 2 (two) times daily with a meal.   . oxyCODONE (ROXICODONE) 15 MG immediate release tablet Take 15 mg by mouth every 6 (six) hours as needed for pain.  . predniSONE (DELTASONE) 20 MG  tablet Take 1 tablet (20 mg total) by mouth 2 (two) times daily with a meal for 5 days.  . promethazine (PHENERGAN) 25 MG tablet   . RELISTOR 150 MG TABS   . rizatriptan (MAXALT) 5 MG tablet Take 5 mg by mouth as needed.  . rosuvastatin (CRESTOR) 5 MG tablet Take 1 tablet (5 mg total) by mouth at bedtime.  . theophylline (UNIPHYL) 400 MG 24 hr tablet Take 1 tablet by mouth daily.  Marland Kitchen tiotropium (SPIRIVA HANDIHALER) 18 MCG inhalation capsule Place 1 capsule into inhaler and inhale daily. pm  . tiZANidine (ZANAFLEX) 4 MG tablet Take 1 tablet by mouth 4 (four) times daily.  Marland Kitchen topiramate (TOPAMAX) 100 MG tablet Take 1 tablet by mouth 2 (two) times daily.  . traZODone (DESYREL) 50 MG tablet TAKE 1 TABLET BY MOUTH AT  BEDTIME  . Venlafaxine HCl 225 MG TB24 Take 1 tablet (225 mg total) by mouth daily.  . colchicine (COLCRYS) 0.6 MG tablet    No facility-administered encounter medications on file as of 01/27/2019.     Activities of Daily Living In your present state of health, do you have any difficulty performing the following activities: 01/27/2019 08/26/2018  Hearing? Y Y  Comment states unable to afford hearing aids -  Vision? N Y  Comment - -  Difficulty concentrating or making decisions? Y N  Comment - -  Walking or climbing stairs? Y Y  Comment - -  Dressing or bathing? N N  Doing errands, shopping? N N  Preparing Food and eating ? N -  Using the Toilet? N -  In the past six months, have you accidently leaked urine? Y -  Comment wears pads for protection -  Do you have problems with loss of bowel control? N -  Managing your Medications? N -  Managing your Finances? N -  Housekeeping or managing your Housekeeping? N -  Some recent data might be hidden    Patient Care Team: [name], MD  as PCP - General (Family Medicine) Erby Pian, MD as Consulting Physician (Specialist) [name], MD as Consulting Physician (Neurology) [name], MD as Consulting Physician  (Anesthesiology) Cathi Roan, [facility] as Pharmacist (Pharmacist)    Assessment:   This is a routine wellness examination for [place].  Exercise Activities and Dietary recommendations Current Exercise Habits: The patient does not participate in regular exercise at present, Exercise limited by: orthopedic condition(s);respiratory conditions(s)  Goals    . "I guess I need to get better control of my diabetes" (pt-stated)     Current Barriers:  Marland Kitchen Knowledge Deficits related to basic Diabetes pathophysiology and self care/management . Knowledge Deficits related to medications used for management of diabetes . Difficulty obtaining or cannot afford medications . Limited Social Support  Case Manager Clinical Goal(s):  Over the next 30 days, patient will demonstrate improved adherence to prescribed treatment plan for diabetes self care/management as evidenced by:  . daily monitoring and recording of CBG  . adherence to prescribed medication regimen   Over the next 90 days, patient will demonstrate improved adherence to prescribed treatment plan as evidenced by: lowered A1c  Interventions:  . Reviewed medications with patient and discussed importance of medication adherence . Advised patient, providing education and rationale, to check cbg daily and record, calling Dr. [name] or pharmacist for findings outside established parameters.    Patient Self Care Activities:  . Self administers oral medications as prescribed . Self administers insulin as prescribed . Checks blood sugars as prescribed and utilize hyper and hypoglycemia protocol as needed  Initial goal documentation       . "I need help straightening out my medicine" (pt-stated)     Patient reports recent BG readings in the 200s in the past 10 days, with an average of 220 mg/dL. We reviewed her goal and her DM medications. Adherence review of fill history reflects non adherence to rosuvastatin. Patient states she currently has  about 2 months supply of her maintenance medicine (refilled one month ago). Refill history reflects this is correct, patient is consistent with fills per history but is not filling everything on her medication list.   Current Barriers:  Marland Kitchen Knowledge Deficits related to medication regimen . Financial Barriers  Updated Pharmacist Clinical Goal(s):  Marland Kitchen Over the next 30 days, patient will demonstrate Improved medication adherence as evidenced by FBG <120 mg/dL  . Over the next 30 days, patient will collaborate with CCM pharmacist to complete [employer] medication assistance application for Spiriva  Interventions: . Collaboration with provider re: medication management  . Medication assistance application for Spiriva Partially completed 05/22/18: Medication assistance for [employer] and [employer] have been approved until 02/19/19   Patient Self Care Activities:  . Calls provider 10 days prior to needing refills on insulins or Dulera so that time is allowed for medication assistance program to ship medications  Please see past updates related to this goal by clicking on the "Past Updates" button in the selected goal       . "I want to make sure I'm managing my heart failure right" (pt-stated)      Clinical Goals: Over then next 7 days, patient will report understanding plan of care related to HF self monitoring/self care  Interventions: HF education reinforced including importance of daily weights, reporting weight gain of 3lb over night or 5lb in a week to provider, and following a low NA diet.  02/25/2018 Reinforced HF education and low sodium diet adherence  03/13/2018 Discussed  sodium intake and role in lower extremity swelling. Encouraged elevation of extremities and taking diuretics as prescribed.     . Medication assistance 2021 (pt-stated)     Current Barriers:  . financial  Pharmacist Clinical Goal(s): Over the next 14 days, Ms.Marvel Plan will provide the necessary supplementary  documents (proof of out of pocket prescription expenditure, proof of household income) needed for medication assistance applications to CCM pharmacist.   Interventions: . CCM pharmacist will apply for medication assistance program for patient's insulin via [employer]  Patient Self Care Activities:  Marland Kitchen Gather necessary documents needed to apply for medication assistance  Initial goal documentation        Fall Risk Fall Risk  01/27/2019 01/22/2019 08/26/2018 05/20/2018 03/19/2018  Falls in the past year? 1 1 1  0 1  Number falls in past yr: 1 1 1  0 1  Injury with Fall? 0 0 1 0 0  Comment - - - - -  Risk for fall due to : Impaired balance/gait;History of fall(s) History of fall(s) History of fall(s);Impaired balance/gait;Impaired vision - -  Risk for fall due to: Comment - - - - -  Follow up Falls prevention discussed Falls evaluation completed - - -   FALL RISK PREVENTION PERTAINING TO THE HOME:  Any stairs in or around the home? No  - pt had ramp installed  If so, do they handrails? No    Home free of loose throw rugs in walkways, pet beds, electrical cords, etc? Yes  Adequate lighting in your home to reduce risk of falls? Yes   ASSISTIVE DEVICES UTILIZED TO PREVENT FALLS:  Life alert? No  Use of a cane, walker or w/c? No  Grab bars in the bathroom? No  Shower chair or bench in shower? Yes  Elevated toilet seat or a handicapped toilet? Yes   DME ORDERS:  DME order needed?  No   TIMED UP AND GO:  Was the test performed? No . Telephonic visit.   Education: Fall risk prevention has been discussed.  Intervention(s) required? No   Depression Screen PHQ 2/9 Scores 01/27/2019 01/22/2019 08/26/2018 05/20/2018  PHQ - 2 Score 4 4 3  0  PHQ- 9 Score 15 15 14 6      Cognitive Function     6CIT Screen 01/27/2019 01/23/2018 01/01/2017  What Year? 0 points 0 points 0 points  What month? 0 points 0 points 0 points  What time? 0 points 0 points 0 points  Count back from 20 0 points 0 points 0  points  Months in reverse 0 points 0 points 0 points  Repeat phrase 0 points 0 points 0 points  Total Score 0 0 0    Immunization History  Administered Date(s) Administered  . Influenza,inj,Quad PF,6+ Mos 01/24/2015, 12/19/2016, 10/29/2017  . Influenza-Unspecified 12/09/2015  . Pneumococcal Polysaccharide-23 02/19/2009  . Tdap 12/09/2012    Qualifies for Shingles Vaccine? Yes  . Due for Shingrix. Education has been provided regarding the importance of this vaccine. Pt has been advised to call insurance company to determine out of pocket expense. Advised may also receive vaccine at local pharmacy or Health Dept. Verbalized acceptance and understanding.  Tdap: Up to date   Flu Vaccine: Due for Flu vaccine. Does the patient want to receive this vaccine today?  No . Education has been provided regarding the importance of this vaccine but still declined. Advised may receive this vaccine at local pharmacy or Health Dept. Aware to provide a copy of the vaccination record if  obtained from local pharmacy or Health Dept. Verbalized acceptance and understanding.  Pneumococcal Vaccine: Due for Pneumococcal vaccine at age 17.   Screening Tests Health Maintenance  Topic Date Due  . OPHTHALMOLOGY EXAM  09/19/1970  . HIV Screening  09/19/1975  . MAMMOGRAM  09/19/1978  . PAP SMEAR-Modifier  09/18/1981  . INFLUENZA VACCINE  09/20/2018  . URINE MICROALBUMIN  03/20/2019  . HEMOGLOBIN A1C  02/27/2019  . FOOT EXAM  03/20/2019  . TETANUS/TDAP  12/10/2022  . COLONOSCOPY  04/26/2027  . PNEUMOCOCCAL POLYSACCHARIDE VACCINE AGE 58-64 HIGH RISK  Completed  . Hepatitis C Screening  Completed    Cancer Screenings:  Colorectal Screening: Completed 04/25/17. Repeat every 10 years;  Mammogram: Due. Pt declines repeat screening at this time.   Bone Density: Due at age 81.  Lung Cancer Screening: (Low Dose CT Chest recommended if Age 33-80 years, 30 pack-year currently smoking OR have quit w/in 15years.)  does qualify.   Lung Cancer Screening Referral: An Epic message has been sent to Burgess Estelle, RN (Oncology Nurse Navigator) regarding the possible need for this exam. Raquel Sarna will review the patient's chart to determine if the patient truly qualifies for the exam. If the patient qualifies, Raquel Sarna will order the Low Dose CT of the chest to facilitate the scheduling of this exam.  Additional Screening:  Hepatitis C Screening: does qualify; Completed 08/27/18  Vision Screening: Recommended annual ophthalmology exams for early detection of glaucoma and other disorders of the eye. Is the patient up to date with their annual eye exam?  No  - scheduled in January per patient Who is the provider or what is the name of the office in which the pt attends annual eye exams? [place] Screening: Recommended annual dental exams for proper oral hygiene  Community Resource Referral:  CRR required this visit?  Yes      Plan:     I have personally reviewed and addressed the Medicare Annual Wellness questionnaire and have noted the following in the patient's chart:  A. Medical and social history B. Use of alcohol, tobacco or illicit drugs  C. Current medications and supplements D. Functional ability and status E.  Nutritional status F.  Physical activity G. Advance directives H. List of other physicians I.  Hospitalizations, surgeries, and ER visits in previous 12 months J.  [place] such as hearing and vision if needed, cognitive and depression L. Referrals and appointments   In addition, I have reviewed and discussed with patient certain preventive protocols, quality metrics, and best practice recommendations. A written personalized care plan for preventive services as well as general preventive health recommendations were provided to patient.   Signed,  Clemetine Marker, LPN Nurse Health Advisor   Nurse Notes: pt states she was advised to get a Covid 19 test from  provider and attempted to go yesterday [facility] drive through testing but when she arrived was told they were closing early due to cold weather. Information given to patient for alternate no cost testing sites through [facility]. Pt appreciative of visit today.

## 2019-01-27 NOTE — Patient Instructions (Signed)
Ms. Manner , Thank you for taking time to come for your Medicare Wellness Visit. I appreciate your ongoing commitment to your health goals. Please review the following plan we discussed and let me know if I can assist you in the future.   Screening recommendations/referrals: Colonoscopy: done 04/25/17 Mammogram: due Bone Density: due at age 58 Recommended yearly ophthalmology/optometry visit for glaucoma screening and checkup Recommended yearly dental visit for hygiene and checkup  Vaccinations: Influenza vaccine: due Pneumococcal vaccine: due age 43 Tdap vaccine: done 12/09/12 Shingles vaccine: Shingrix discussed. Please contact your pharmacy for coverage information.   Advanced directives: Advance directive discussed with you today. I have provided a copy for you to complete at home and have notarized. Once this is complete please bring a copy in to our office so we can scan it into your chart.  Conditions/risks identified: If you wish to quit smoking, help is available. For free tobacco cessation program offerings call the [facility] at 425-615-3492 or Live Well Line at 506-255-5918. You may also visit [email or web address].com or email [email or web address] for more information on other programs.   Next appointment: Please follow up in one year for your Medicare Annual Wellness visit.    Preventive Care 40-64 Years, Female Preventive care refers to lifestyle choices and visits with your health care provider that can promote health and wellness. What does preventive care include?  A yearly physical exam. This is also called an annual well check.  Dental exams once or twice a year.  Routine eye exams. Ask your health care provider how often you should have your eyes checked.  Personal lifestyle choices, including:  Daily care of your teeth and gums.  Regular physical activity.  Eating a healthy diet.  Avoiding tobacco and drug use.  Limiting alcohol  use.  Practicing safe sex.  Taking low-dose aspirin daily starting at age 66.  Taking vitamin and mineral supplements as recommended by your health care provider. What happens during an annual well check? The services and screenings done by your health care provider during your annual well check will depend on your age, overall health, lifestyle risk factors, and family history of disease. Counseling  Your health care provider may ask you questions about your:  Alcohol use.  Tobacco use.  Drug use.  Emotional well-being.  Home and relationship well-being.  Sexual activity.  Eating habits.  Work and work Statistician.  Method of birth control.  Menstrual cycle.  Pregnancy history. Screening  You may have the following tests or measurements:  Height, weight, and BMI.  Blood pressure.  Lipid and cholesterol levels. These may be checked every 5 years, or more frequently if you are over 67 years old.  Skin check.  Lung cancer screening. You may have this screening every year starting at age 3 if you have a 30-pack-year history of smoking and currently smoke or have quit within the past 15 years.  Fecal occult blood test (FOBT) of the stool. You may have this test every year starting at age 23.  Flexible sigmoidoscopy or colonoscopy. You may have a sigmoidoscopy every 5 years or a colonoscopy every 10 years starting at age 50.  Hepatitis C blood test.  Hepatitis B blood test.  Sexually transmitted disease (STD) testing.  Diabetes screening. This is done by checking your blood sugar (glucose) after you have not eaten for a while (fasting). You may have this done every 1-3 years.  Mammogram. This may be done every 1-2 years. Talk  to your health care provider about when you should start having regular mammograms. This may depend on whether you have a family history of breast cancer.  BRCA-related cancer screening. This may be done if you have a family history of  breast, ovarian, tubal, or peritoneal cancers.  Pelvic exam and Pap test. This may be done every 3 years starting at age 62. Starting at age 59, this may be done every 5 years if you have a Pap test in combination with an HPV test.  Bone density scan. This is done to screen for osteoporosis. You may have this scan if you are at high risk for osteoporosis. Discuss your test results, treatment options, and if necessary, the need for more tests with your health care provider. Vaccines  Your health care provider may recommend certain vaccines, such as:  Influenza vaccine. This is recommended every year.  Tetanus, diphtheria, and acellular pertussis (Tdap, Td) vaccine. You may need a Td booster every 10 years.  Zoster vaccine. You may need this after age 16.  Pneumococcal 13-valent conjugate (PCV13) vaccine. You may need this if you have certain conditions and were not previously vaccinated.  Pneumococcal polysaccharide (PPSV23) vaccine. You may need one or two doses if you smoke cigarettes or if you have certain conditions. Talk to your health care provider about which screenings and vaccines you need and how often you need them. This information is not intended to replace advice given to you by your health care provider. Make sure you discuss any questions you have with your health care provider. Document Released: 03/04/2015 Document Revised: 10/26/2015 Document Reviewed: 12/07/2014 Elsevier Interactive Patient Education  2017 [place] Prevention in the Home Falls can cause injuries. They can happen to people of all ages. There are many things you can do to make your home safe and to help prevent falls. What can I do on the outside of my home?  Regularly fix the edges of walkways and driveways and fix any cracks.  Remove anything that might make you trip as you walk through a door, such as a raised step or threshold.  Trim any bushes or trees on the path to your home.   Use bright outdoor lighting.  Clear any walking paths of anything that might make someone trip, such as rocks or tools.  Regularly check to see if handrails are loose or broken. Make sure that both sides of any steps have handrails.  Any raised decks and porches should have guardrails on the edges.  Have any leaves, snow, or ice cleared regularly.  Use sand or salt on walking paths during winter.  Clean up any spills in your garage right away. This includes oil or grease spills. What can I do in the bathroom?  Use night lights.  Install grab bars by the toilet and in the tub and shower. Do not use towel bars as grab bars.  Use non-skid mats or decals in the tub or shower.  If you need to sit down in the shower, use a plastic, non-slip stool.  Keep the floor dry. Clean up any water that spills on the floor as soon as it happens.  Remove soap buildup in the tub or shower regularly.  Attach bath mats securely with double-sided non-slip rug tape.  Do not have throw rugs and other things on the floor that can make you trip. What can I do in the bedroom?  Use night lights.  Make sure that you  have a light by your bed that is easy to reach.  Do not use any sheets or blankets that are too big for your bed. They should not hang down onto the floor.  Have a firm chair that has side arms. You can use this for support while you get dressed.  Do not have throw rugs and other things on the floor that can make you trip. What can I do in the kitchen?  Clean up any spills right away.  Avoid walking on wet floors.  Keep items that you use a lot in easy-to-reach places.  If you need to reach something above you, use a strong step stool that has a grab bar.  Keep electrical cords out of the way.  Do not use floor polish or wax that makes floors slippery. If you must use wax, use non-skid floor wax.  Do not have throw rugs and other things on the floor that can make you trip. What  can I do with my stairs?  Do not leave any items on the stairs.  Make sure that there are handrails on both sides of the stairs and use them. Fix handrails that are broken or loose. Make sure that handrails are as long as the stairways.  Check any carpeting to make sure that it is firmly attached to the stairs. Fix any carpet that is loose or worn.  Avoid having throw rugs at the top or bottom of the stairs. If you do have throw rugs, attach them to the floor with carpet tape.  Make sure that you have a light switch at the top of the stairs and the bottom of the stairs. If you do not have them, ask someone to add them for you. What else can I do to help prevent falls?  Wear shoes that:  Do not have high heels.  Have rubber bottoms.  Are comfortable and fit you well.  Are closed at the toe. Do not wear sandals.  If you use a stepladder:  Make sure that it is fully opened. Do not climb a closed stepladder.  Make sure that both sides of the stepladder are locked into place.  Ask someone to hold it for you, if possible.  Clearly mark and make sure that you can see:  Any grab bars or handrails.  First and last steps.  Where the edge of each step is.  Use tools that help you move around (mobility aids) if they are needed. These include:  Canes.  Walkers.  Scooters.  Crutches.  Turn on the lights when you go into a dark area. Replace any light bulbs as soon as they burn out.  Set up your furniture so you have a clear path. Avoid moving your furniture around.  If any of your floors are uneven, fix them.  If there are any pets around you, be aware of where they are.  Review your medicines with your doctor. Some medicines can make you feel dizzy. This can increase your chance of falling. Ask your doctor what other things that you can do to help prevent falls. This information is not intended to replace advice given to you by your health care provider. Make sure you  discuss any questions you have with your health care provider. Document Released: 12/02/2008 Document Revised: 07/14/2015 Document Reviewed: 03/12/2014 Elsevier Interactive Patient Education  2017 [employer].

## 2019-01-29 ENCOUNTER — Other Ambulatory Visit: Payer: Self-pay

## 2019-01-29 ENCOUNTER — Telehealth: Payer: Self-pay | Admitting: *Deleted

## 2019-01-29 DIAGNOSIS — Z20822 Contact with and (suspected) exposure to covid-19: Secondary | ICD-10-CM

## 2019-01-29 DIAGNOSIS — Z87891 Personal history of nicotine dependence: Secondary | ICD-10-CM

## 2019-01-29 NOTE — Telephone Encounter (Signed)
Received referral for initial lung cancer screening scan. Contacted patient and obtained smoking history,(current, 41 pack year) as well as answering questions related to screening process. Patient denies signs of lung cancer such as weight loss or hemoptysis. Patient denies comorbidity that would prevent curative treatment if lung cancer were found. Patient is scheduled for shared decision making visit and CT scan on 02/11/19 at 1045am.

## 2019-01-30 ENCOUNTER — Telehealth: Payer: Self-pay

## 2019-01-30 NOTE — Telephone Encounter (Signed)
Copied from [place] [id number]. Topic: Referral - Status >> Jan 30, 2019  [id number] PM Simone Curia D wrote: [id number] Left message on voicemail for patient to return my call regarding food banks. Ambrose Mantle [phone]

## 2019-01-31 LAB — NOVEL CORONAVIRUS, NAA: SARS-CoV-2, NAA: NOT DETECTED

## 2019-02-05 ENCOUNTER — Telehealth: Payer: Self-pay

## 2019-02-05 NOTE — Telephone Encounter (Signed)
Copied from [place] [phone]. Topic: Referral - Status >> Feb 05, 2019  [id number] PM Simone Curia D wrote: [id number] Spoke with patient about local food bank resources.  Will follow up with her in a few days. Ambrose Mantle [phone]

## 2019-02-11 ENCOUNTER — Inpatient Hospital Stay: Payer: Medicare Other | Attending: Oncology | Admitting: Oncology

## 2019-02-11 ENCOUNTER — Ambulatory Visit: Payer: Medicare Other | Attending: Oncology

## 2019-02-11 ENCOUNTER — Other Ambulatory Visit: Payer: Self-pay

## 2019-02-17 ENCOUNTER — Telehealth: Payer: Self-pay

## 2019-02-17 NOTE — Telephone Encounter (Signed)
Copied from [place] [phone]. Topic: Referral - Status >> Feb 17, 2019  [id number] PM Simone Curia D wrote: [id number] Follow-up call regarding food banks.  Attempted to call both home and cell numbers voicemail did not connect at either number unable to leave message.  Will attempt to call again. Ambrose Mantle [phone]

## 2019-02-25 ENCOUNTER — Ambulatory Visit: Payer: Self-pay | Admitting: Pharmacist

## 2019-02-25 ENCOUNTER — Telehealth: Payer: Self-pay

## 2019-02-25 NOTE — Chronic Care Management (AMB) (Signed)
  Chronic Care Management   Note  02/25/2019 Name: Tricia [name] MRN: [id number] DOB: Sep 27, 1960  59 y.o. year old female referred to Chronic Care Management by Dr. [name] for medication assistance and medication management. Outreach to patient today to follow up on 2021 assistance- received financial documents but not completed application.   Was unable to reach patient via telephone today. All listed phone numbers are invalid/disconnected (unsuccessful outreach #1).  Follow up plan: The care management team will reach out to the patient again over the next 5-7 days.   Ruben Reason, PharmD Clinical Pharmacist [facility] [phone]

## 2019-02-25 NOTE — Telephone Encounter (Signed)
Copied from [place] [id number]. Topic: Referral - Status >> Feb 25, 2019  [id number] PM Simone Curia D wrote: [id number] Spoke with patient about food banks she was able to visit and has enough food. Patient stated she did not have any other needs right now.  Ambrose Mantle [phone]

## 2019-02-27 ENCOUNTER — Telehealth: Payer: Self-pay

## 2019-02-27 DIAGNOSIS — F331 Major depressive disorder, recurrent, moderate: Secondary | ICD-10-CM

## 2019-02-27 DIAGNOSIS — H2513 Age-related nuclear cataract, bilateral: Secondary | ICD-10-CM | POA: Diagnosis not present

## 2019-02-27 LAB — HM DIABETES EYE EXAM

## 2019-02-27 MED ORDER — VENLAFAXINE HCL ER 225 MG PO TB24: 1.0000 | ORAL_TABLET | Freq: Every day | ORAL | 0 refills | Status: DC

## 2019-02-27 NOTE — Telephone Encounter (Signed)
Refill request for general medication. Venlafaxine to [facility].  Last office visit 01/22/2019  Follow up on 01/28/2020

## 2019-03-02 NOTE — Telephone Encounter (Signed)
Lm for scheduling

## 2019-03-04 ENCOUNTER — Encounter: Payer: Self-pay | Admitting: Family Medicine

## 2019-03-04 ENCOUNTER — Ambulatory Visit (INDEPENDENT_AMBULATORY_CARE_PROVIDER_SITE_OTHER): Payer: Medicare Other | Admitting: Pharmacist

## 2019-03-04 DIAGNOSIS — E1169 Type 2 diabetes mellitus with other specified complication: Secondary | ICD-10-CM

## 2019-03-04 DIAGNOSIS — E785 Hyperlipidemia, unspecified: Secondary | ICD-10-CM | POA: Diagnosis not present

## 2019-03-04 DIAGNOSIS — J449 Chronic obstructive pulmonary disease, unspecified: Secondary | ICD-10-CM | POA: Diagnosis not present

## 2019-03-05 NOTE — Patient Instructions (Signed)
Goals Addressed            This Visit's Progress   . 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 Counseled on the [employer] and [place] vaccine is 95% effective and consists of 2 doses, 3 weeks apart o Moderna vaccine is 94.5% effective and consists of 2 doses, 4 weeks apart o Must have both doses from the same manufacturer o Scientists believe the vaccine will be effective against the new, highly contagious vaccines; [place] has announced some preliminary data supporting this  o Common side effects are sore arm at the injection site, mild fever and fatigue for 24-48 hours - Side effects more common after second dose . Provided information on how to schedule vaccine and register with state of [place] . Continue to wear a mask, wash hands frequently, maintain a distance of 6 feet from others  Patient Self Care Activities:  . Patient verbalizes understanding of plan to receive COVID19 immunization . Calls provider office for new concerns or questions . Schedules self for vaccine when able to based on [place] guidelines  Initial goal documentation      . Medication assistance 2021 (pt-stated)       Current Barriers:  . financial  Pharmacist Clinical Goal(s): Over the next 14 days, Ms.Marvel Plan will provide the necessary supplementary documents (proof of out of pocket prescription expenditure, proof of household income) needed for medication assistance applications to CCM pharmacist.   Interventions: . CCM pharmacist will apply for  medication assistance program for patient's insulin via [employer] . Updated 1/13: reviewed necessary documents for completed application  Patient Self Care Activities:  Marland Kitchen Gather necessary documents needed to apply for medication assistance  Please see past updates related to this goal by clicking on the "Past Updates" button in the selected goal

## 2019-03-05 NOTE — Chronic Care Management (AMB) (Signed)
Chronic Care Management   Follow Up Note   03/05/2019 Name: Tricia [name] MRN: [id number] DOB: [birth year]  Subjective Tricia [name] is a 59 y.o. year old female who is a primary care patient of [name], MD. The CCM clinical pharmacist was consulted for assistance with chronic disease management and care coordination needs. Successful telephone outreach today, HIPAA identifiers verified.   Review of patient status, including review of consultants reports, relevant laboratory and other test results, and collaboration with appropriate care team members and the patient's provider was performed as part of comprehensive patient evaluation and provision of chronic care management services.    Objective   Outpatient Encounter Medications as of 03/04/2019  Medication Sig Note  . albuterol (VENTOLIN HFA) 108 (90 Base) MCG/ACT inhaler INHALE 1 PUFF BY MOUTH AS NEEDED   . ALPRAZolam (XANAX) 0.5 MG tablet Take 1 tablet by mouth 2 (two) times daily as needed.    . blood glucose meter kit and supplies KIT Dispense accuchek aviva plus; can change if needed e11.9   . budesonide-formoterol (SYMBICORT) 160-4.5 MCG/ACT inhaler Inhale into the lungs.   . calcium carbonate (TUMS EX) 750 MG chewable tablet Chew 2 tablets by mouth as needed for heartburn.   . cetirizine (ZYRTEC) 10 MG tablet Take 10 mg by mouth as needed for allergies.   Marland Kitchen colchicine (COLCRYS) 0.6 MG tablet  12/26/2017: Old prescription; copay is $150  . furosemide (LASIX) 40 MG tablet Take 1 tablet (40 mg total) by mouth daily as needed. 12/26/2017: "not urinating" (3-4x daily)  . gabapentin (NEURONTIN) 600 MG tablet Take 1 tablet (600 mg total) by mouth 3 (three) times daily.   Marland Kitchen glucose blood test strip Use as directed to check blood glucose daily   . Insulin Glargine (BASAGLAR KWIKPEN) 100 UNIT/ML SOPN Inject 0.2-0.5 mLs (20-50 Units total) into the skin daily. 01/23/2018: 30unit  . insulin lispro (HUMALOG) 100 UNIT/ML  KiwkPen Inject 0.04-0.1 mLs (4-10 Units total) into the skin 2 (two) times daily before lunch and supper. 02/04/2018: Taking 20 units before meals- 1 or 2 meals per day  . Insulin Pen Needle 32G X 6 MM MISC 2 each by Does not apply route daily.   Marland Kitchen ipratropium-albuterol (DUONEB) 0.5-2.5 (3) MG/3ML SOLN Inhale 3 mLs into the lungs every 6 (six) hours as needed.   . metFORMIN (GLUCOPHAGE-XR) 750 MG 24 hr tablet TAKE 1 TABLET BY MOUTH  DAILY WITH BREAKFAST   . montelukast (SINGULAIR) 10 MG tablet Take 1 tablet (10 mg total) by mouth daily. afternoon   . morphine (MS CONTIN) 15 MG 12 hr tablet Take 1 tablet by mouth 2 (two) times daily.   . naproxen (NAPROSYN) 500 MG tablet Take 500 mg by mouth 2 (two) times daily with a meal.    . oxyCODONE (ROXICODONE) 15 MG immediate release tablet Take 15 mg by mouth every 6 (six) hours as needed for pain.   . promethazine (PHENERGAN) 25 MG tablet    . RELISTOR 150 MG TABS    . rizatriptan (MAXALT) 5 MG tablet Take 5 mg by mouth as needed. 08/16/2014: Medication taken as needed. 1 tab po at onset of symptoms, repeat x1 in 2 hours if necessary, not to exceed 57m in 24 hrs. Received from: [facility] . rosuvastatin (CRESTOR) 5 MG tablet Take 1 tablet (5 mg total) by mouth at bedtime.   . theophylline (UNIPHYL) 400 MG 24 hr tablet Take 1 tablet by mouth daily.   .Marland Kitchentiotropium (  SPIRIVA HANDIHALER) 18 MCG inhalation capsule Place 1 capsule into inhaler and inhale daily. pm 08/16/2014: Received from: [facility]  . tiZANidine (ZANAFLEX) 4 MG tablet Take 1 tablet by mouth 4 (four) times daily.   Marland Kitchen topiramate (TOPAMAX) 100 MG tablet Take 1 tablet by mouth 2 (two) times daily.   . traZODone (DESYREL) 50 MG tablet TAKE 1 TABLET BY MOUTH AT  BEDTIME   . Venlafaxine HCl 225 MG TB24 Take 1 tablet (225 mg total) by mouth daily.    No facility-administered encounter medications on file as of 03/04/2019.     Goals Addressed             This Visit's Progress   . 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 Counseled on the Coca[employer]Col[employer] and [place] vaccine is 95% effective and consists of 2 doses, 3 weeks apart o Moderna vaccine is 94.5% effective and consists of 2 doses, 4 weeks apart o Must have both doses from the same manufacturer o Scientists believe the vaccine will be effective against the new, highly contagious vaccines; [place] has announced some preliminary data supporting this  o Common side effects are sore arm at the injection site, mild fever and fatigue for 24-48 hours - Side effects more common after second dose . Provided information on how to schedule vaccine and register with state of [place] . Continue to wear a mask, wash hands frequently, maintain a distance of 6 feet from others  Patient Self Care Activities:  . Patient verbalizes understanding of plan to receive COVID19 immunization . Calls provider office for new concerns or questions . Schedules self for vaccine when able to based on [place] guidelines  Initial goal documentation      . Medication assistance 2021 (pt-stated)       Current Barriers:  . financial  Pharmacist Clinical Goal(s): Over the next 14 days, Ms.Marvel Plan will provide the necessary supplementary documents (proof of out of pocket prescription expenditure, proof of household income) needed for medication assistance applications to CCM pharmacist.   Interventions: . CCM pharmacist will apply for medication assistance program  for patient's insulin via [employer] . Updated 1/13: reviewed necessary documents for completed application  Patient Self Care Activities:  Marland Kitchen Gather necessary documents needed to apply for medication assistance  Please see past updates related to this goal by clicking on the "Past Updates" button in the selected goal         Plan Care Coordination appointment in 2 weeks or sooner pending patient returning completed [place], PharmD Clinical Pharmacist [place] [phone]

## 2019-03-17 ENCOUNTER — Inpatient Hospital Stay: Payer: Medicare Other | Attending: Nurse Practitioner | Admitting: Nurse Practitioner

## 2019-03-17 ENCOUNTER — Ambulatory Visit
Admission: RE | Admit: 2019-03-17 | Discharge: 2019-03-17 | Disposition: A | Discharge status: Home / Self Care | Payer: Medicare Other | Source: Ambulatory Visit | Attending: Oncology | Admitting: Oncology

## 2019-03-17 ENCOUNTER — Other Ambulatory Visit: Payer: Self-pay

## 2019-03-17 DIAGNOSIS — Z87891 Personal history of nicotine dependence: Secondary | ICD-10-CM | POA: Diagnosis not present

## 2019-03-17 NOTE — Progress Notes (Signed)
Virtual Visit via Video Enabled Telemedicine Note   I connected with Tricia [name] on 03/17/19 at 1:45 PM EST by video enabled telemedicine visit and verified that I am speaking with the correct person using two identifiers.   I discussed the limitations, risks, security and privacy concerns of performing an evaluation and management service by telemedicine and the availability of in-person appointments. I also discussed with the patient that there may be a patient responsible charge related to this service. The patient expressed understanding and agreed to proceed.   Other persons participating in the visit and their role in the encounter: Burgess Estelle, RN- checking in patient & navigation  Patient's location: [place]  Provider's location: Clinic  Chief Complaint: Low Dose CT Screening  Patient agreed to evaluation by telemedicine to discuss shared decision making for consideration of low dose CT lung cancer screening.    In accordance with CMS guidelines, patient has met eligibility criteria including age, absence of signs or symptoms of lung cancer.  Social History   Tobacco Use  . Smoking status: Current Every Day Smoker    Packs/day: 1.00    Years: 41.00    Pack years: 41.00    Types: Cigarettes    Start date: 05/19/1977  . Smokeless tobacco: Never Used  . Tobacco comment: info given for 1-800 QUIT NOW  Substance Use Topics  . Alcohol use: No    Alcohol/week: 0.0 standard drinks     A shared decision-making session was conducted prior to the performance of CT scan. This includes one or more decision aids, includes benefits and harms of screening, follow-up diagnostic testing, over-diagnosis, false positive rate, and total radiation exposure.   Counseling on the importance of adherence to annual lung cancer LDCT screening, impact of co-morbidities, and ability or willingness to undergo diagnosis and treatment is imperative for compliance of the program.    Counseling on the importance of continued smoking cessation for former smokers; the importance of smoking cessation for current smokers, and information about tobacco cessation interventions have been given to patient including [place] and [employer] Quit  programs.   Written order for lung cancer screening with LDCT has been given to the patient and any and all questions have been answered to the best of my abilities.    Yearly follow up will be coordinated by Burgess Estelle, Thoracic Navigator.  I discussed the assessment and treatment plan with the patient. The patient was provided an opportunity to ask questions and all were answered. The patient agreed with the plan and demonstrated an understanding of the instructions.   The patient was advised to call back or seek an in-person evaluation if the symptoms worsen or if the condition fails to improve as anticipated.   I provided 15 minutes of face-to-face video visit time during this encounter, and > 50% was spent counseling as documented under my assessment & plan.   Beckey Rutter, DNP, AGNP-C [place] at [facility] [phone] (clinic)

## 2019-03-18 ENCOUNTER — Telehealth: Payer: Self-pay | Admitting: *Deleted

## 2019-03-18 NOTE — Telephone Encounter (Signed)
Notified patient of LDCT lung cancer screening program results with recommendation for 12 month follow up imaging. Also notified of incidental findings noted below and is encouraged to discuss further with PCP (especially the thyroid nodule) who will receive a copy of this note and/or the CT report. Patient verbalizes understanding.   IMPRESSION: 1. Insert lung rads 2 2. Bronchial wall thickening with fluid/debris in segmental and subsegmental airways to the right lower lobe. This may reflect infectious/inflammatory etiology but in the appropriate clinical setting, aspiration could have this appearance. 3. 1.8 cm right thyroid nodule. Recommend thyroid US.(Ref: J Am Coll Radiol. 2015 Feb;12(2): 143-50). 4.  Emphysema (ICD10-J43.9) and Aortic Atherosclerosis (ICD10-170.0)

## 2019-03-21 ENCOUNTER — Other Ambulatory Visit: Payer: Self-pay | Admitting: Family Medicine

## 2019-03-21 DIAGNOSIS — E041 Nontoxic single thyroid nodule: Secondary | ICD-10-CM

## 2019-03-24 DIAGNOSIS — Z720 Tobacco use: Secondary | ICD-10-CM | POA: Diagnosis not present

## 2019-03-24 DIAGNOSIS — M545 Low back pain: Secondary | ICD-10-CM | POA: Diagnosis not present

## 2019-03-24 DIAGNOSIS — G894 Chronic pain syndrome: Secondary | ICD-10-CM | POA: Diagnosis not present

## 2019-03-24 DIAGNOSIS — Z79891 Long term (current) use of opiate analgesic: Secondary | ICD-10-CM | POA: Diagnosis not present

## 2019-03-24 DIAGNOSIS — M5430 Sciatica, unspecified side: Secondary | ICD-10-CM | POA: Diagnosis not present

## 2019-04-02 ENCOUNTER — Other Ambulatory Visit: Payer: Self-pay | Admitting: Family Medicine

## 2019-04-02 DIAGNOSIS — J441 Chronic obstructive pulmonary disease with (acute) exacerbation: Secondary | ICD-10-CM

## 2019-04-02 DIAGNOSIS — E119 Type 2 diabetes mellitus without complications: Secondary | ICD-10-CM

## 2019-04-02 DIAGNOSIS — G4700 Insomnia, unspecified: Secondary | ICD-10-CM

## 2019-05-01 DIAGNOSIS — E119 Type 2 diabetes mellitus without complications: Secondary | ICD-10-CM | POA: Diagnosis not present

## 2019-05-01 DIAGNOSIS — Z720 Tobacco use: Secondary | ICD-10-CM | POA: Diagnosis not present

## 2019-05-01 DIAGNOSIS — I10 Essential (primary) hypertension: Secondary | ICD-10-CM | POA: Diagnosis not present

## 2019-05-01 DIAGNOSIS — I872 Venous insufficiency (chronic) (peripheral): Secondary | ICD-10-CM | POA: Diagnosis not present

## 2019-05-01 DIAGNOSIS — I5032 Chronic diastolic (congestive) heart failure: Secondary | ICD-10-CM | POA: Diagnosis not present

## 2019-05-04 ENCOUNTER — Other Ambulatory Visit: Payer: Self-pay

## 2019-05-04 ENCOUNTER — Ambulatory Visit: Payer: Self-pay

## 2019-05-04 ENCOUNTER — Ambulatory Visit: Payer: Medicare Other | Attending: Internal Medicine

## 2019-05-04 ENCOUNTER — Encounter: Payer: Self-pay | Admitting: Internal Medicine

## 2019-05-04 ENCOUNTER — Ambulatory Visit (INDEPENDENT_AMBULATORY_CARE_PROVIDER_SITE_OTHER): Payer: Medicare Other | Admitting: Internal Medicine

## 2019-05-04 ENCOUNTER — Telehealth: Payer: Self-pay | Admitting: Family Medicine

## 2019-05-04 ENCOUNTER — Ambulatory Visit: Payer: Self-pay | Admitting: *Deleted

## 2019-05-04 VITALS — Ht 69.0 in | Wt 178.0 lb

## 2019-05-04 DIAGNOSIS — E1142 Type 2 diabetes mellitus with diabetic polyneuropathy: Secondary | ICD-10-CM

## 2019-05-04 DIAGNOSIS — J449 Chronic obstructive pulmonary disease, unspecified: Secondary | ICD-10-CM

## 2019-05-04 DIAGNOSIS — J40 Bronchitis, not specified as acute or chronic: Secondary | ICD-10-CM | POA: Diagnosis not present

## 2019-05-04 DIAGNOSIS — I340 Nonrheumatic mitral (valve) insufficiency: Secondary | ICD-10-CM | POA: Insufficient documentation

## 2019-05-04 DIAGNOSIS — F3341 Major depressive disorder, recurrent, in partial remission: Secondary | ICD-10-CM

## 2019-05-04 DIAGNOSIS — J4541 Moderate persistent asthma with (acute) exacerbation: Secondary | ICD-10-CM | POA: Diagnosis not present

## 2019-05-04 DIAGNOSIS — E222 Syndrome of inappropriate secretion of antidiuretic hormone: Secondary | ICD-10-CM

## 2019-05-04 DIAGNOSIS — F17200 Nicotine dependence, unspecified, uncomplicated: Secondary | ICD-10-CM

## 2019-05-04 MED ORDER — DOXYCYCLINE HYCLATE 100 MG PO TABS: 100.0000 mg | ORAL_TABLET | Freq: Two times a day (BID) | ORAL | 0 refills | Status: DC

## 2019-05-04 MED ORDER — PREDNISONE 10 MG PO TABS: ORAL_TABLET | ORAL | 0 refills | Status: DC

## 2019-05-04 MED ORDER — PROMETHAZINE-DM 6.25-15 MG/5ML PO SYRP: 5.0000 mL | ORAL_SOLUTION | Freq: Four times a day (QID) | ORAL | 0 refills | Status: DC | PRN

## 2019-05-04 NOTE — Telephone Encounter (Signed)
Pt called to request rx for her chronic bronchitis. Stated Dr. [name] normally sends in an antibiotic and prednisone. Please advise. Requesting callback if something will be sent to pharmacy [facility]

## 2019-05-04 NOTE — Telephone Encounter (Signed)
Attempted to contact patient and discuss symptoms. Patient has placed a request for medication for chronic bronchitis. Left message for her to call back to office.

## 2019-05-04 NOTE — Telephone Encounter (Signed)
See triage note.

## 2019-05-04 NOTE — Telephone Encounter (Signed)
Pt called to request rx for her chronic bronchitis. Stated Dr. [name] normally sends in an antibiotic and prednisone. Please advise. Requesting callback if something will be sent to pharmacy [facility] Call to patient- patient reports she has COPD and chronic bronchitis that is seasonal. Patient states she has congestion and cough for 2 days and mucus is green. Patient states her breathing/SOB is worse today and she states that means she is trying to get bronchitis. Patient states she has not been tested for COVID recently.  Advised patient she may need appointment for antibiotic treatment- offered- but she wants to send message to PCP first- she states she will do as PCP advises if she needs appointment- she will make one on PCP request.  Reason for Disposition . Lots of coughing . [1] Longstanding difficulty breathing (e.g., CHF, COPD, emphysema) AND [2] WORSE than normal  Answer Assessment - Initial Assessment Questions 1. LOCATION: "Where does it hurt?"      No pain- some SOB with exertion 2. ONSET: "When did the sinus pain start?"  (e.g., hours, days)      Couple days ago- with nausea and vomiting 3. SEVERITY: "How bad is the pain?"   (Scale 1-10; mild, moderate or severe)   - MILD (1-3): doesn't interfere with normal activities    - MODERATE (4-7): interferes with normal activities (e.g., work or school) or awakens from sleep   - SEVERE (8-10): excruciating pain and patient unable to do any normal activities        No pain 4. RECURRENT SYMPTOM: "Have you ever had sinus problems before?" If so, ask: "When was the last time?" and "What happened that time?"      Bronchitis- patient reports she has yearly bouts with season changes. 5. NASAL CONGESTION: "Is the nose blocked?" If so, ask, "Can you open it or must you breathe through the mouth?"     Both nostrils congested - mouth breathing 6. NASAL DISCHARGE: "Do you have discharge from your nose?" If so ask, "What color?"     Coughing  green phelm- and nasal congestion is green 7. FEVER: "Do you have a fever?" If so, ask: "What is it, how was it measured, and when did it start?"      No fever 8. OTHER SYMPTOMS: "Do you have any other symptoms?" (e.g., sore throat, cough, earache, difficulty breathing)     Cough, sinus drainage causing sore throat, SOB with exertion 9. PREGNANCY: "Is there any chance you are pregnant?" "When was your last menstrual period?"     n/a  Protocols used: SINUS PAIN OR CONGESTION-A-AH, BREATHING DIFFICULTY-A-AH

## 2019-05-04 NOTE — Progress Notes (Signed)
Name: Tricia [name]   MRN: [id number]    DOB: [birth year]   Date:05/04/2019       Progress Note  Subjective  Chief Complaint  Chief Complaint  Patient presents with  . Cough    Onset "a few days ago"  . Vomiting    wednesday and last night  . Nasal Congestion  . Shortness of Breath    I connected with  [name] on 05/04/19 at  2:20 PM EDT by telephone and verified that I am speaking with the correct person using two identifiers.  I discussed the limitations, risks, security and privacy concerns of performing an evaluation and management service by telephone and the availability of in person appointments. Staff also discussed with the patient that there may be a patient responsible charge related to this service. Patient Location: Home Provider Location: [facility] Additional Individuals present: none  HPI Patient is a 59 year old female with a significant past medical history included in the active problem list, including a COPD/asthma  history, still a current every day smoker who presents with a bronchitis flare concern.  She notes this happens just about every year this time and often treated with an antibiotic and steroids. + cough, + production of greenish mucus, thick + SOB, not at rest, no orthopnea, no PND no fever,  + sore throat - minimal +mild congestion + loss of smell for years after in labor 39 years ago, + loss of taste + N/V, this morning was last + muscle aches as has chronic pain, on oxycodone,morphine + marked loose stools/diarrhea a week ago, better since No CP,  No confusion, passing out episodes  Last Covid test a few months ago, on waiting list for vaccine Daughter in [facility] with pneumonia and sepsis, not Covid (had a year ago), she visited with her daughter yesterday. Comorbid conditions reviewed + asthma/COPD hx,  + h/o DM, + heart disease (diastolic dysfcn), CKD,  not morbid obesity  "Z-pak not work" in past.   Patient Active  Problem List   Diagnosis Date Noted  . Benign neoplasm of descending colon   . Polyp of sigmoid colon   . 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 Polyneuropathy 10/21/2014  . Chronic venous insufficiency 10/05/2014  . Bilateral leg edema 08/16/2014  . Major depression in partial remission ([place]) 08/16/2014  . Acid reflux 08/16/2014  . 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 Asthma, moderate persistent 08/16/2014  . Carpal tunnel syndrome 08/16/2014  . Cervical pain 08/16/2014  . CAFL (chronic airflow limitation) ([place]) 08/16/2014  . Type 2 diabetes mellitus with peripheral neuropathy ([place]) 08/16/2014  . Diabetes mellitus type 2, insulin dependent ([place]) 08/16/2014  . Dyslipidemia 08/16/2014  . Current smoker 08/16/2014  . Essential (primary) hypertension 08/16/2014  . Benign neoplasm of stomach 08/16/2014  . Gout 08/16/2014  . HLD (hyperlipidemia) 08/16/2014  . Low back pain 08/16/2014  . Lumbar radiculopathy 08/16/2014  . Headache, migraine 08/16/2014  . Arthralgia of multiple joints 08/16/2014  . Avitaminosis D 08/16/2014  . Primary osteoarthritis of both knees 06/22/2014  . Benign essential tremor 10/02/2013  . Cervical dystonia 10/02/2013  . Chronic diastolic heart failure ([place]) 11/16/2012  . Chronic pain 11/13/2012    Past Surgical History:  Procedure Laterality Date  . CARPAL TUNNEL RELEASE Bilateral    x2 right, 1x on left  . COLONOSCOPY    . COLONOSCOPY WITH PROPOFOL N/A 04/25/2017   Procedure: COLONOSCOPY WITH PROPOFOL;  Surgeon: [name], MD;  Location: [facility];  Service: Endoscopy;  Laterality: N/A;  diabetic-oral med  . DILATION AND CURETTAGE OF UTERUS    . EXTERNAL EAR SURGERY Left    x2  . POLYPECTOMY  04/25/2017   Procedure: POLYPECTOMY INTESTINAL;  Surgeon: [name], MD;  Location: [place];  Service: Endoscopy;;  . SPINE SURGERY     herniated disc  . TUBAL LIGATION      Family History  Problem Relation Age of  Onset  . Emphysema Mother   . Stroke Father   . Throat cancer Father   . Multiple sclerosis Daughter   . Bipolar disorder Daughter   . Cervical cancer Daughter   . Bipolar disorder Daughter   . Lung cancer Maternal Aunt   . Lung cancer Maternal Uncle   . Lung cancer Maternal Grandmother   . Lung cancer Maternal Grandfather     Social History   Tobacco Use  . Smoking status: Current Every Day Smoker    Packs/day: 1.00    Years: 41.00    Pack years: 41.00    Types: Cigarettes    Start date: 05/19/1977  . Smokeless tobacco: Never Used  . Tobacco comment: info given for 1-800 QUIT NOW  Substance Use Topics  . Alcohol use: No    Alcohol/week: 0.0 standard drinks     Current Outpatient Medications:  .  albuterol (VENTOLIN HFA) 108 (90 Base) MCG/ACT inhaler, INHALE 1 PUFF BY MOUTH AS NEEDED, Disp: 18 g, Rfl: 5 .  ALPRAZolam (XANAX) 0.5 MG tablet, Take 1 tablet by mouth 2 (two) times daily as needed. , Disp: , Rfl:  .  blood glucose meter kit and supplies KIT, Dispense accuchek aviva plus; can change if needed e11.9, Disp: 1 each, Rfl: 0 .  budesonide-formoterol (SYMBICORT) 160-4.5 MCG/ACT inhaler, Inhale into the lungs., Disp: , Rfl:  .  calcium carbonate (TUMS EX) 750 MG chewable tablet, Chew 2 tablets by mouth as needed for heartburn., Disp: , Rfl:  .  cetirizine (ZYRTEC) 10 MG tablet, Take 10 mg by mouth as needed for allergies., Disp: , Rfl:  .  colchicine (COLCRYS) 0.6 MG tablet, , Disp: , Rfl:  .  furosemide (LASIX) 40 MG tablet, Take 1 tablet (40 mg total) by mouth daily as needed. (Patient taking differently: Take 20 mg by mouth daily as needed. Was told to drop to 26m because BP was getting too low), Disp: 30 tablet, Rfl: 2 .  gabapentin (NEURONTIN) 600 MG tablet, Take 1 tablet (600 mg total) by mouth 3 (three) times daily., Disp: 90 tablet, Rfl: 3 .  glucose blood test strip, Use as directed to check blood glucose daily, Disp: 100 each, Rfl: 2 .  Insulin Pen Needle 32G X  6 MM MISC, 2 each by Does not apply route daily., Disp: 200 each, Rfl: 2 .  ipratropium-albuterol (DUONEB) 0.5-2.5 (3) MG/3ML SOLN, Inhale 3 mLs into the lungs every 6 (six) hours as needed., Disp: 360 mL, Rfl: 3 .  metFORMIN (GLUCOPHAGE-XR) 750 MG 24 hr tablet, TAKE 1 TABLET BY MOUTH  DAILY WITH BREAKFAST, Disp: 90 tablet, Rfl: 0 .  montelukast (SINGULAIR) 10 MG tablet, TAKE 1 TABLET BY MOUTH  DAILY IN THE AFTERNOON, Disp: 90 tablet, Rfl: 2 .  morphine (MS CONTIN) 15 MG 12 hr tablet, Take 1 tablet by mouth 2 (two) times daily., Disp: , Rfl:  .  naproxen (NAPROSYN) 500 MG tablet, Take 500 mg by mouth 2 (two) times daily with a meal. , Disp: , Rfl:  .  oxyCODONE (ROXICODONE) 15 MG immediate release tablet, Take 15 mg by mouth every 6 (six) hours as needed for pain., Disp: , Rfl:  .  promethazine (PHENERGAN) 25 MG tablet, , Disp: , Rfl:  .  RELISTOR 150 MG TABS, , Disp: , Rfl:  .  rizatriptan (MAXALT) 5 MG tablet, Take 5 mg by mouth as needed., Disp: , Rfl:  .  rosuvastatin (CRESTOR) 5 MG tablet, Take 1 tablet (5 mg total) by mouth at bedtime., Disp: 90 tablet, Rfl: 1 .  theophylline (UNIPHYL) 400 MG 24 hr tablet, Take 1 tablet by mouth daily., Disp: , Rfl:  .  tiotropium (SPIRIVA HANDIHALER) 18 MCG inhalation capsule, Place 1 capsule into inhaler and inhale daily. pm, Disp: , Rfl:  .  tiZANidine (ZANAFLEX) 4 MG tablet, Take 1 tablet by mouth 4 (four) times daily., Disp: , Rfl:  .  topiramate (TOPAMAX) 100 MG tablet, Take 1 tablet by mouth 2 (two) times daily., Disp: , Rfl:  .  traZODone (DESYREL) 50 MG tablet, TAKE 1 TABLET BY MOUTH AT  BEDTIME, Disp: 90 tablet, Rfl: 0 .  Venlafaxine HCl 225 MG TB24, Take 1 tablet (225 mg total) by mouth daily., Disp: 90 tablet, Rfl: 0 .  Insulin Glargine (BASAGLAR KWIKPEN) 100 UNIT/ML SOPN, Inject 0.2-0.5 mLs (20-50 Units total) into the skin daily. (Patient not taking: Reported on 05/04/2019), Disp: 15 mL, Rfl: 0 .  insulin lispro (HUMALOG) 100 UNIT/ML KiwkPen,  Inject 0.04-0.1 mLs (4-10 Units total) into the skin 2 (two) times daily before lunch and supper. (Patient not taking: Reported on 05/04/2019), Disp: 15 mL, Rfl: 0  Allergies  Allergen Reactions  . Augmentin [Amoxicillin-Pot Clavulanate] Diarrhea  . Penicillins Itching    With staff assistance, above reviewed with the patient today.  ROS: As per HPI, otherwise no specific complaints on a limited and focused system review   Objective  Virtual encounter, vitals not obtained.  Body mass index is 26.29 kg/m.  Physical Exam   Appears in NAD via conversation Pulmonary/Chest: No obvious respiratory distress. Speaking in complete sentences Neurological: Pt is alert and oriented, Speech is normal Psychiatric: Patient has a normal mood and affect, Judgment and thought content normal.   No results found for this or any previous visit (from the past 72 hour(s)).  PHQ2/9: Depression screen [facility] 2/9 05/04/2019 01/27/2019 01/22/2019 08/26/2018 05/20/2018  Decreased Interest _0 0 0  Down, Depressed, Hopeless _1 0  PHQ - 2 Score _2 0  Altered sleeping 3 0 0 2 3  Tired, decreased energy _3 Change in appetite _4 0  Feeling bad or failure about yourself  _5 0  Trouble concentrating _6 0  Moving slowly or fidgety/restless _7 0  Suicidal thoughts 0 0 0 0 0  PHQ-9 Score _8 Difficult doing work/chores Not difficult at all Somewhat difficult Somewhat difficult Somewhat difficult Not difficult at all  Some recent data might be hidden   PHQ-2/9 Result reviewed, as previous and currently managed for major depression   Fall Risk: Fall Risk  05/04/2019 01/27/2019 01/22/2019 08/26/2018 05/20/2018  Falls in the past year? _9 0  Number falls in past yr: _10 0  Injury with Fall? 0 0 0 1 0  Comment - - - - -  Risk for fall due to : - Impaired  balance/gait;History of fall(s) History of fall(s) History of fall(s);Impaired balance/gait;Impaired vision -    Risk for fall due to: Comment - - - - -  Follow up - Falls prevention discussed Falls evaluation completed - -     Assessment & Plan 1. Bronchitis Educated patient that this is likely a bronchitis, related to a flare of her asthma/COPD which occurs seasonally.  In the setting of a pandemic, and with some of the symptoms noted above, do feel testing for Covid is needed, and she states she will do so.  Informed testing at the [facility] outlet center presently, and a number given for her to call to make an appointment to get tested.  Do feel treating with a doxycycline product and a prednisone wean is indicated, as well as a cough medicine, with Promethazine DM prescribed for her today.  Also some concern visiting her daughter who is in the hospital presently with a pneumonia. Emphasized isolating presently until the Covid test has returned, and if negative, still isolate until her symptoms are very well resolving due to contagious concerns. Increase fluids and rest emphasized Can use a Tylenol type product as needed for any low-grade temps or headaches or achiness.  She is on strong pain medicines for her chronic pain. To follow-up if not improving or worsening despite the above.  Also to follow-up if the Covid test returned positive, to help review any isolation restrictions that may need to continue over time.   - doxycycline (VIBRA-TABS) 100 MG tablet; Take 1 tablet (100 mg total) by mouth 2 (two) times daily.  Dispense: 20 tablet; Refill: 0 - predniSONE (DELTASONE) 10 MG tablet; Take four tabs daily X 2 days, then two tabs daily X 2 days, then one tab daily X 2 days  Dispense: 14 tablet; Refill: 0 - promethazine-dextromethorphan (PROMETHAZINE-DM) 6.25-15 MG/5ML syrup; Take 5 mLs by mouth 4 (four) times daily as needed for cough.  Dispense: 240 mL; Refill: 0  2. Moderate persistent asthma with acute exacerbation As above - doxycycline (VIBRA-TABS) 100 MG tablet; Take 1 tablet  (100 mg total) by mouth 2 (two) times daily.  Dispense: 20 tablet; Refill: 0 - predniSONE (DELTASONE) 10 MG tablet; Take four tabs daily X 2 days, then two tabs daily X 2 days, then one tab daily X 2 days  Dispense: 14 tablet; Refill: 0 - promethazine-dextromethorphan (PROMETHAZINE-DM) 6.25-15 MG/5ML syrup; Take 5 mLs by mouth 4 (four) times daily as needed for cough.  Dispense: 240 mL; Refill: 0  3. Chronic obstructive pulmonary disease, unspecified COPD type ([place]) As above - doxycycline (VIBRA-TABS) 100 MG tablet; Take 1 tablet (100 mg total) by mouth 2 (two) times daily.  Dispense: 20 tablet; Refill: 0 - predniSONE (DELTASONE) 10 MG tablet; Take four tabs daily X 2 days, then two tabs daily X 2 days, then one tab daily X 2 days  Dispense: 14 tablet; Refill: 0 - promethazine-dextromethorphan (PROMETHAZINE-DM) 6.25-15 MG/5ML syrup; Take 5 mLs by mouth 4 (four) times daily as needed for cough.  Dispense: 240 mL; Refill: 0  4. Type 2 diabetes mellitus with peripheral neuropathy (HCC) Noted the steroid will increase her blood sugar some, and she is aware that.  We will do a fairly rapid wean to help minimize the effects.  5. Recurrent major depressive disorder, in partial remission ([place]) Continue her current regimen.  6. Current smoker Increases her risks, and she has been made aware that in her past    I discussed the assessment and treatment  plan with the patient. The patient was provided an opportunity to ask questions and all were answered. The patient agreed with the plan and demonstrated an understanding of the instructions.  The patient was advised to call back or seek an in-person evaluation if the symptoms worsen or if the condition fails to improve as anticipated.  I provided 20 minutes of non-face-to-face time during this encounter that included discussing at length patient's sx/history, pertinent pmhx, medications, treatment and follow up plan. This time also included the necessary  documentation, orders, and chart review.  Towanda Malkin, MD

## 2019-05-05 LAB — NOVEL CORONAVIRUS, NAA: SARS-CoV-2, NAA: NOT DETECTED

## 2019-05-19 DIAGNOSIS — G894 Chronic pain syndrome: Secondary | ICD-10-CM | POA: Diagnosis not present

## 2019-05-19 DIAGNOSIS — Z79891 Long term (current) use of opiate analgesic: Secondary | ICD-10-CM | POA: Diagnosis not present

## 2019-05-19 DIAGNOSIS — M5430 Sciatica, unspecified side: Secondary | ICD-10-CM | POA: Diagnosis not present

## 2019-05-19 DIAGNOSIS — M545 Low back pain: Secondary | ICD-10-CM | POA: Diagnosis not present

## 2019-05-19 DIAGNOSIS — Z720 Tobacco use: Secondary | ICD-10-CM | POA: Diagnosis not present

## 2019-05-21 DIAGNOSIS — I5032 Chronic diastolic (congestive) heart failure: Secondary | ICD-10-CM | POA: Diagnosis not present

## 2019-05-28 ENCOUNTER — Other Ambulatory Visit: Payer: Self-pay | Admitting: Family Medicine

## 2019-05-28 NOTE — Telephone Encounter (Signed)
Requested medication (s) are due for refill today: yes  Requested medication (s) are on the active medication list:yes  Last refill:  ?  Future visit scheduled: no  Notes to clinic:  historical provider    Requested Prescriptions  Pending Prescriptions Disp Refills   theophylline (UNIPHYL) 400 MG 24 hr tablet       Sig: Take 1 tablet (400 mg total) by mouth daily.      Pulmonology:  Theophyllines Failed - 05/28/2019  2:48 PM      Failed - Theophylline (serum) in normal range and within 360 days    Theophylline Lvl  Date Value Ref Range Status  08/27/2018 4.7 (L) 10.0 - 20.0 ug/mL Final    Comment:    (NOTE)                                Detection Limit =  0.8                          <0.8 indicates None Detected Performed At: [facility] [place], [place] [id number] Rush Farmer MD [id number]           [place] encounter within last 12 months    Recent Outpatient Visits           3 weeks ago [facility] Lebron Conners D, MD   4 months ago Moderate persistent asthma with acute exacerbation   [place], [name]   9 months ago Diabetes mellitus type 2, insulin dependent [facility])   [facility] [name], MD   1 year ago Moderate recurrent major depression [facility])   [facility] [place], Drue Stager, MD   1 year ago Type 2 diabetes mellitus with peripheral neuropathy [facility])   [facility] [name], MD       Future Appointments             In 8 months [facility], [facility]

## 2019-05-28 NOTE — Telephone Encounter (Signed)
Medication Refill - Medication: theophylline 400 ng 90 days supply   Has the patient contacted their pharmacy? No. (Agent: If no, request that the patient contact the pharmacy for the refill.) (Agent: If yes, when and what did the pharmacy advise?)  Preferred Pharmacy (with phone number or street name): [facility] in [place]  Agent: Please be advised that RX refills may take up to 3 business days. We ask that you follow-up with your pharmacy.

## 2019-05-29 NOTE — Telephone Encounter (Signed)
Pt called to check status of refill, pt states that she is completely out and needs this as soon as possible. Please advise

## 2019-06-05 DIAGNOSIS — R0600 Dyspnea, unspecified: Secondary | ICD-10-CM | POA: Diagnosis not present

## 2019-06-05 DIAGNOSIS — K219 Gastro-esophageal reflux disease without esophagitis: Secondary | ICD-10-CM | POA: Diagnosis not present

## 2019-06-05 DIAGNOSIS — E041 Nontoxic single thyroid nodule: Secondary | ICD-10-CM | POA: Diagnosis not present

## 2019-06-05 DIAGNOSIS — F17218 Nicotine dependence, cigarettes, with other nicotine-induced disorders: Secondary | ICD-10-CM | POA: Diagnosis not present

## 2019-06-05 DIAGNOSIS — J449 Chronic obstructive pulmonary disease, unspecified: Secondary | ICD-10-CM | POA: Diagnosis not present

## 2019-06-08 ENCOUNTER — Other Ambulatory Visit: Payer: Self-pay | Admitting: Family Medicine

## 2019-06-08 DIAGNOSIS — F331 Major depressive disorder, recurrent, moderate: Secondary | ICD-10-CM

## 2019-06-08 DIAGNOSIS — G4700 Insomnia, unspecified: Secondary | ICD-10-CM

## 2019-06-08 NOTE — Telephone Encounter (Signed)
Requested Prescriptions  Pending Prescriptions Disp Refills  . Venlafaxine HCl 225 MG TB24 [Pharmacy Med Name: VENLAFAXINE ER TAB 225MG ] 90 tablet 1    Sig: TAKE 1 TABLET BY MOUTH  DAILY     Psychiatry: Antidepressants - SNRI - desvenlafaxine & venlafaxine Failed - 06/08/2019 12:27 PM      Failed - LDL in normal range and within 360 days    Ldl Cholesterol, Calc  Date Value Ref Range Status  10/09/2012 85 0 - 100 mg/dL Final   LDL Cholesterol (Calc)  Date Value Ref Range Status  10/29/2017 50 mg/dL (calc) Final    Comment:    Reference range: <100 . Desirable range <100 mg/dL for primary prevention;   <70 mg/dL for patients with CHD or diabetic patients  with > or = 2 CHD risk factors. Marland Kitchen LDL-C is now calculated using the Martin-Hopkins  calculation, which is a validated novel method providing  better accuracy than the Friedewald equation in the  estimation of LDL-C.  Cresenciano Genre et al. Annamaria Helling. [id number]): 2061-2068  (http://education.[facility].com/faq/FAQ164)          Failed - Total Cholesterol in normal range and within 360 days    Cholesterol, Total  Date Value Ref Range Status  07/06/2015 163 100 - 199 mg/dL Final   Cholesterol  Date Value Ref Range Status  10/29/2017 106 <200 mg/dL Final  10/09/2012 144 0 - 200 mg/dL Final         Failed - Triglycerides in normal range and within 360 days    Triglycerides  Date Value Ref Range Status  10/29/2017 203 (H) <150 mg/dL Final    Comment:    . If a non-fasting specimen was collected, consider repeat triglyceride testing on a fasting specimen if clinically indicated.  Yates Decamp et al. J. of Clin. Lipidol. [id number]. .   10/09/2012 172 0 - 200 mg/dL Final         Passed - Completed PHQ-2 or PHQ-9 in the last 360 days.      Passed - Last BP in normal range    BP Readings from Last 1 Encounters:  08/19/18 137/81         Passed - Valid encounter within last 6 months    Recent Outpatient Visits           1 month ago [facility] Lebron Conners D, MD   4 months ago Moderate persistent asthma with acute exacerbation   [place], [name]   9 months ago Diabetes mellitus type 2, insulin dependent [facility])   [facility] [name], MD   1 year ago Moderate recurrent major depression [facility])   [facility], Drue Stager, MD   1 year ago Type 2 diabetes mellitus with peripheral neuropathy [facility])   [facility] [name], MD      Future Appointments            In 7 months [facility], [place]            . traZODone (DESYREL) 50 MG tablet [Pharmacy Med Name: TRAZODONE  50MG   TAB] 90 tablet 1    Sig: TAKE 1 TABLET BY MOUTH AT  BEDTIME     Psychiatry: Antidepressants - Serotonin Modulator Passed - 06/08/2019 12:27 PM      Passed - Completed PHQ-2 or PHQ-9 in the last 360 days.      Passed - Valid encounter  within last 6 months    Recent Outpatient Visits          1 month ago [facility] Lebron Conners D, MD   4 months ago Moderate persistent asthma with acute exacerbation   [place], [name]   9 months ago Diabetes mellitus type 2, insulin dependent [facility])   [facility] [name], MD   1 year ago Moderate recurrent major depression [facility])   [facility] [name], MD   1 year ago Type 2 diabetes mellitus with peripheral neuropathy [facility])   [facility] [name], MD      Future Appointments            In 7 months [facility], [facility]

## 2019-06-27 ENCOUNTER — Ambulatory Visit: Payer: Medicare Other

## 2019-06-30 DIAGNOSIS — E041 Nontoxic single thyroid nodule: Secondary | ICD-10-CM | POA: Diagnosis not present

## 2019-07-02 ENCOUNTER — Telehealth: Payer: Self-pay | Admitting: Family Medicine

## 2019-07-02 NOTE — Telephone Encounter (Signed)
Pt is calling to let dr [name] know she received her ct chest lung screening  Result from dr Raul Del her pulmonologist. The ct chest lung screening showed thyroid nodules.  Dr Raul Del ordered thyroid ultrasound. Pt has nodules on her thyroid and nodules on.lymph nodes per pt and has been referred to endocrinologist. Pt is waiting on the referral.

## 2019-07-03 NOTE — Telephone Encounter (Signed)
FYI  Dr. Raul Del office is making the referral

## 2019-07-10 ENCOUNTER — Other Ambulatory Visit: Payer: Self-pay | Admitting: Family Medicine

## 2019-07-10 DIAGNOSIS — G4700 Insomnia, unspecified: Secondary | ICD-10-CM

## 2019-07-15 ENCOUNTER — Telehealth: Payer: Self-pay | Admitting: Family Medicine

## 2019-07-15 NOTE — Chronic Care Management (AMB) (Signed)
  Care Management   Note  07/15/2019 Name: [name] MRN: [id number] DOB: 1960-12-29  Tricia [name] is a 59 y.o. year old female who is a primary care patient of [name], MD and is actively engaged with the care management team. I reached out to [name] by phone today to assist with scheduling an initial visit with the Pharmacist  Follow up plan: Patient declines further follow up and engagement by the care management team. Appropriate care team members and provider have been notified via electronic communication.   Noreene Larsson, [place], [place], [place] [address] Direct Dial: [phone] [email or web address] Website: [email or web address]

## 2019-07-16 ENCOUNTER — Other Ambulatory Visit: Payer: Self-pay | Admitting: Family Medicine

## 2019-07-16 DIAGNOSIS — G4700 Insomnia, unspecified: Secondary | ICD-10-CM

## 2019-07-29 DIAGNOSIS — M542 Cervicalgia: Secondary | ICD-10-CM | POA: Diagnosis not present

## 2019-07-29 DIAGNOSIS — M5416 Radiculopathy, lumbar region: Secondary | ICD-10-CM | POA: Diagnosis not present

## 2019-07-29 DIAGNOSIS — Z79891 Long term (current) use of opiate analgesic: Secondary | ICD-10-CM | POA: Diagnosis not present

## 2019-07-29 DIAGNOSIS — G894 Chronic pain syndrome: Secondary | ICD-10-CM | POA: Diagnosis not present

## 2019-07-29 DIAGNOSIS — M545 Low back pain: Secondary | ICD-10-CM | POA: Diagnosis not present

## 2019-07-30 ENCOUNTER — Telehealth: Payer: Self-pay | Admitting: Family Medicine

## 2019-07-30 NOTE — Telephone Encounter (Signed)
Call to patient- explained medication management would be something she would need to discuss with PCP- appointment scheduled ( 08/05/19)- it is scheduled as virtual( patient has MyChart-- but states she can not access it) because patient is being treated for sinus symptoms presently. She is current with COVID vaccine. Patient states she only has 2 days of medication left and wants to know if PCP can RF this until appointment as she has been taking it for years. Told patient I would forward her request- but I am not sure that PCP will fill

## 2019-07-30 NOTE — Telephone Encounter (Signed)
Pt called stating that her pain management doctor retired. Pt states that she went to a new pain clinic and that they will not prescribe her xanax. Pt states that she was referred to PCP to get xanax refilled. Please advise.      [place], [place] [address]  Phone: [phone] Fax: [phone]  Hours: Not open 24 hours

## 2019-07-30 NOTE — Telephone Encounter (Signed)
Lm with Lonnie for pt to return call. Dr [name] stated pt will need to be seen before refill can be given.

## 2019-07-31 NOTE — Telephone Encounter (Signed)
Pt has scheduled her appt for Wednesday 6.16.2021but will be completely out of this medication by tomorrow. She wanted to know if you could give her enough to last until her appt.states that her pain doctor normally fills this medication but he has retired

## 2019-08-03 NOTE — Telephone Encounter (Signed)
Called pt to inform her that Dr [name] will not fill this medication due to it being a controlled substance and pt gets it from pain clinic.

## 2019-08-03 NOTE — Telephone Encounter (Signed)
Tricia [name] returning call, states that pain management referred her back to PCP. Tricia [name] has upcoming appointment with Dr. [name] on Wednesday. Tricia [name] states she is getting anxious because she is out of medication.

## 2019-08-03 NOTE — Telephone Encounter (Signed)
Had to leave a message for pt to call back

## 2019-08-03 NOTE — Telephone Encounter (Signed)
I called pt again and spoke to her this time. Pt stated her pain management dr retired and someone else took over. I explained to her that our office does not prescribe Alprazolam, Per Dr [name]. I explained to pt she may need to call the pain management office back and explain to them our policy here and that Dr [name] would not be giving her this medication. Pt states she would call them.

## 2019-08-05 ENCOUNTER — Ambulatory Visit (INDEPENDENT_AMBULATORY_CARE_PROVIDER_SITE_OTHER): Payer: Medicare Other | Admitting: Family Medicine

## 2019-08-05 ENCOUNTER — Encounter: Payer: Self-pay | Admitting: Family Medicine

## 2019-08-05 ENCOUNTER — Other Ambulatory Visit: Payer: Self-pay

## 2019-08-05 VITALS — BP 110/70 | HR 113 | Temp 96.6°F | Resp 16 | Ht 69.0 in | Wt 176.6 lb

## 2019-08-05 DIAGNOSIS — F331 Major depressive disorder, recurrent, moderate: Secondary | ICD-10-CM

## 2019-08-05 DIAGNOSIS — E119 Type 2 diabetes mellitus without complications: Secondary | ICD-10-CM

## 2019-08-05 DIAGNOSIS — J432 Centrilobular emphysema: Secondary | ICD-10-CM

## 2019-08-05 DIAGNOSIS — E041 Nontoxic single thyroid nodule: Secondary | ICD-10-CM | POA: Diagnosis not present

## 2019-08-05 DIAGNOSIS — I700 Atherosclerosis of aorta: Secondary | ICD-10-CM

## 2019-08-05 DIAGNOSIS — E1142 Type 2 diabetes mellitus with diabetic polyneuropathy: Secondary | ICD-10-CM | POA: Diagnosis not present

## 2019-08-05 DIAGNOSIS — Z1231 Encounter for screening mammogram for malignant neoplasm of breast: Secondary | ICD-10-CM

## 2019-08-05 DIAGNOSIS — J449 Chronic obstructive pulmonary disease, unspecified: Secondary | ICD-10-CM

## 2019-08-05 DIAGNOSIS — E1169 Type 2 diabetes mellitus with other specified complication: Secondary | ICD-10-CM

## 2019-08-05 DIAGNOSIS — N1831 Chronic kidney disease, stage 3a: Secondary | ICD-10-CM | POA: Diagnosis not present

## 2019-08-05 DIAGNOSIS — E785 Hyperlipidemia, unspecified: Secondary | ICD-10-CM | POA: Diagnosis not present

## 2019-08-05 DIAGNOSIS — J4489 Other specified chronic obstructive pulmonary disease: Secondary | ICD-10-CM

## 2019-08-05 DIAGNOSIS — Z114 Encounter for screening for human immunodeficiency virus [HIV]: Secondary | ICD-10-CM

## 2019-08-05 DIAGNOSIS — Z794 Long term (current) use of insulin: Secondary | ICD-10-CM

## 2019-08-05 DIAGNOSIS — I5032 Chronic diastolic (congestive) heart failure: Secondary | ICD-10-CM

## 2019-08-05 MED ORDER — METFORMIN HCL ER 750 MG PO TB24: 1500.0000 mg | ORAL_TABLET | Freq: Every day | ORAL | 1 refills | Status: DC

## 2019-08-05 MED ORDER — ROSUVASTATIN CALCIUM 5 MG PO TABS: 5.0000 mg | ORAL_TABLET | Freq: Every day | ORAL | 1 refills | Status: DC

## 2019-08-05 MED ORDER — OMEPRAZOLE 40 MG PO CPDR: 40.0000 mg | DELAYED_RELEASE_CAPSULE | Freq: Every day | ORAL | 1 refills | Status: DC

## 2019-08-05 MED ORDER — INSULIN LISPRO (1 UNIT DIAL) 100 UNIT/ML (KWIKPEN): 5.0000 [IU] | PEN_INJECTOR | Freq: Three times a day (TID) | SUBCUTANEOUS | 1 refills | Status: DC

## 2019-08-05 MED ORDER — INSULIN PEN NEEDLE 32G X 6 MM MISC: 2.0000 | Freq: Every day | 2 refills | Status: DC

## 2019-08-05 MED ORDER — LANTUS SOLOSTAR 100 UNIT/ML ~~LOC~~ SOPN
20.0000 [IU] | PEN_INJECTOR | Freq: Every day | SUBCUTANEOUS | 1 refills | Status: DC
Start: 2019-08-05 — End: 2021-05-08

## 2019-08-05 MED ORDER — COLCHICINE 0.6 MG PO TABS: 0.6000 mg | ORAL_TABLET | Freq: Every day | ORAL | 0 refills | Status: DC

## 2019-08-05 NOTE — Progress Notes (Signed)
Name: Tricia [name]   MRN: [id number]    DOB: 06-25-1960   Date:08/05/2019       Progress Note  Subjective  Chief Complaint  Chief Complaint  Patient presents with  . Medication Refill    She needs refill on Colchincine, Indomethacine and promethazine.  . Diabetes    Missed appointment with Endo. yesterday has another appointment on Tuesday.  . Depression    HPI  Diabetes insulin requiring: she states she has been taking medication, missed appointment with endocrinologist because of transportation problems. Last A1C in our office was 6.7 %, but she has not been seen since July of 2020, and states had to take prednisone taper last week for COPD flare and glucose has been in the 300's. She has dyslipidemia, and microalbuminuria. Discussed importance of regular follow ups. We will check labs today. She denies polyphagia, polydipsia or polyuria.   Chronic pain: sees pain clinic, used to see  Dr Sanjuan Dame and she was getting  BZD and promethazine on top of pain medications, but since he retired so she is seeing integrative pain medications, they stopped BZD and is now on buspar and states not controlling symptoms as much , she asked what else she can do, she is waiting for psychiatrist to see if she can get a relieve   Asthma and COPD and chronic airflow limitation: under the care of Dr. Raul Del. She had CT chest done, seen by pulmonologist and referred to Endo by Dr. Raul Del for evaluation of thyroid nodule, appointment next week. She is not using oxygen. She is taking Spiriva , Symbicort and prn albuterol. She had daily cough , wheezing and SOB but it is stable.   CT chest from 03/17/2019  1. Insert lung rads 2 2. Bronchial wall thickening with fluid/debris in segmental and subsegmental airways to the right lower lobe. This may reflect infectious/inflammatory etiology but in the appropriate clinical setting, aspiration could have this appearance. 3. 1.8 cm right thyroid nodule.  Recommend thyroid US.(Ref: J Am Coll Radiol. 2015 Feb;12(2): 143-50). 4.  Emphysema (ICD10-J43.9) and Aortic Atherosclerosis (ICD10-170.0)   Major Depression: chronic and recurrent, phq 9 is high. . She lives with boyfriend and he has DM and also some other medical problems, she also worries about her daughter's the youngest one has multiple medical problems recently diagnosed with colon cancer and has lynch syndrome ,also has a daughter in [place] that is a drug addict. She is on disability , struggling financially, cannot afford seeing psychiatrist of therapist Taking effexor , she states mood depends on her current situation, she is currently trying to see a psychiatrist   CHF: doing well at this time,  she has orthopnea - she uses two pillows , but she states from post-nasal drainage. She used to see Dr. Fletcher Anon but is currently seeing  Dr. Ubaldo Glassing on her last visit, she went to see him in April and is on lasix daily   Echo from 06/05/2019  NORMAL LEFT VENTRICULAR SYSTOLIC FUNCTION  NORMAL RIGHT VENTRICULAR [place] (See above)  NO VALVULAR STENOSIS  Closest EF: >55% (Estimated)  Aortic: MILD AR  Mitral: MILD MR  Tricuspid: MILD TR  Echo showed enlarged left and right atrium  Atherosclerosis of Aorta: discussed CT results discussed statin therapy and aspirin   Thyroid nodule: found on CT chest but also on [place] that was done at [facility] clinic, she has an appointment tomorrow, some lymphadenopathy also.    Patient Active Problem List  Diagnosis Date Noted  . Nonrheumatic mitral valve regurgitation 05/04/2019  . Benign neoplasm of descending colon   . Polyp of sigmoid colon   . 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 Polyneuropathy 10/21/2014  . Chronic venous insufficiency 10/05/2014  . Bilateral leg edema 08/16/2014  . Major depression in partial remission ([place]) 08/16/2014  . Acid reflux 08/16/2014  . 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  Asthma, moderate persistent 08/16/2014  . Carpal tunnel syndrome 08/16/2014  . Cervical pain 08/16/2014  . CAFL (chronic airflow limitation) ([place]) 08/16/2014  . Type 2 diabetes mellitus with peripheral neuropathy ([place]) 08/16/2014  . Diabetes mellitus type 2, insulin dependent ([place]) 08/16/2014  . Dyslipidemia 08/16/2014  . Current smoker 08/16/2014  . Essential (primary) hypertension 08/16/2014  . Benign neoplasm of stomach 08/16/2014  . Gout 08/16/2014  . HLD (hyperlipidemia) 08/16/2014  . Low back pain 08/16/2014  . Lumbar radiculopathy 08/16/2014  . Headache, migraine 08/16/2014  . Arthralgia of multiple joints 08/16/2014  . Avitaminosis D 08/16/2014  . Primary osteoarthritis of both knees 06/22/2014  . Benign essential tremor 10/02/2013  . Cervical dystonia 10/02/2013  . Chronic diastolic heart failure ([place]) 11/16/2012  . Chronic pain 11/13/2012    Past Surgical History:  Procedure Laterality Date  . CARPAL TUNNEL RELEASE Bilateral    x2 right, 1x on left  . COLONOSCOPY    . COLONOSCOPY WITH PROPOFOL N/A 04/25/2017   Procedure: COLONOSCOPY WITH PROPOFOL;  Surgeon: [name], MD;  Location: [place];  Service: Endoscopy;  Laterality: N/A;  diabetic-oral med  . DILATION AND CURETTAGE OF UTERUS    . EXTERNAL EAR SURGERY Left    x2  . POLYPECTOMY  04/25/2017   Procedure: POLYPECTOMY INTESTINAL;  Surgeon: [name], MD;  Location: [place];  Service: Endoscopy;;  . SPINE SURGERY     herniated disc  . TUBAL LIGATION      Family History  Problem Relation Age of Onset  . Emphysema Mother   . Stroke Father   . Throat cancer Father   . Multiple sclerosis Daughter   . Bipolar disorder Daughter   . Cervical cancer Daughter   . Bipolar disorder Daughter   . Lung cancer Maternal Aunt   . Lung cancer Maternal Uncle   . Lung cancer Maternal Grandmother   . Lung cancer Maternal Grandfather     Social History   Tobacco Use  . Smoking status: Current  Every Day Smoker    Packs/day: 1.00    Years: 41.00    Pack years: 41.00    Types: Cigarettes    Start date: 05/19/1977  . Smokeless tobacco: Never Used  Substance Use Topics  . Alcohol use: No    Alcohol/week: 0.0 standard drinks     Current Outpatient Medications:  .  albuterol (VENTOLIN HFA) 108 (90 Base) MCG/ACT inhaler, INHALE 1 PUFF BY MOUTH AS NEEDED, Disp: 18 g, Rfl: 5 .  blood glucose meter kit and supplies KIT, Dispense accuchek aviva plus; can change if needed e11.9, Disp: 1 each, Rfl: 0 .  budesonide-formoterol (SYMBICORT) 160-4.5 MCG/ACT inhaler, USE 2 INHALATIONS INTO THE  LUNGS 2 TIMES DAILY, Disp: , Rfl:  .  busPIRone (BUSPAR) 7.5 MG tablet, Take 7.5 mg by mouth 2 (two) times daily., Disp: , Rfl:  .  cetirizine (ZYRTEC) 10 MG tablet, Take 10 mg by mouth as needed for allergies., Disp: , Rfl:  .  colchicine (COLCRYS) 0.6 MG tablet, Take 1 tablet (0.6 mg total) by mouth  daily., Disp: 30 tablet, Rfl: 0 .  cyclobenzaprine (FLEXERIL) 10 MG tablet, Take 10 mg by mouth 3 (three) times daily., Disp: , Rfl:  .  furosemide (LASIX) 40 MG tablet, Take 1 tablet (40 mg total) by mouth daily as needed. (Patient taking differently: Take 20 mg by mouth daily as needed. Was told to drop to 48m because BP was getting too low), Disp: 30 tablet, Rfl: 2 .  gabapentin (NEURONTIN) 600 MG tablet, Take 1 tablet (600 mg total) by mouth 3 (three) times daily., Disp: 90 tablet, Rfl: 3 .  glucose blood test strip, Use as directed to check blood glucose daily, Disp: 100 each, Rfl: 2 .  hydrOXYzine (ATARAX/VISTARIL) 25 MG tablet, Take 25 mg by mouth 2 (two) times daily., Disp: , Rfl:  .  Insulin Pen Needle 32G X 6 MM MISC, 2 each by Does not apply route daily., Disp: 200 each, Rfl: 2 .  ipratropium-albuterol (DUONEB) 0.5-2.5 (3) MG/3ML SOLN, Inhale 3 mLs into the lungs every 6 (six) hours as needed., Disp: 360 mL, Rfl: 3 .  metFORMIN (GLUCOPHAGE-XR) 750 MG 24 hr tablet, Take 2 tablets (1,500 mg total) by  mouth daily with breakfast., Disp: 180 tablet, Rfl: 1 .  montelukast (SINGULAIR) 10 MG tablet, TAKE 1 TABLET BY MOUTH  DAILY IN THE AFTERNOON, Disp: 90 tablet, Rfl: 2 .  morphine (AVINZA) 30 MG 24 hr capsule, , Disp: , Rfl:  .  morphine (MS CONTIN) 15 MG 12 hr tablet, Take 1 tablet by mouth 2 (two) times daily., Disp: , Rfl:  .  omeprazole (PRILOSEC) 40 MG capsule, Take 1 capsule (40 mg total) by mouth daily., Disp: 90 capsule, Rfl: 1 .  oxyCODONE (ROXICODONE) 15 MG immediate release tablet, Take 15 mg by mouth every 6 (six) hours as needed for pain., Disp: , Rfl:  .  promethazine (PHENERGAN) 25 MG tablet, , Disp: , Rfl:  .  RELISTOR 150 MG TABS, , Disp: , Rfl:  .  rizatriptan (MAXALT) 5 MG tablet, Take 5 mg by mouth as needed., Disp: , Rfl:  .  rosuvastatin (CRESTOR) 5 MG tablet, Take 1 tablet (5 mg total) by mouth at bedtime., Disp: 90 tablet, Rfl: 1 .  theophylline (UNIPHYL) 400 MG 24 hr tablet, Take 1 tablet by mouth daily., Disp: , Rfl:  .  tiotropium (SPIRIVA HANDIHALER) 18 MCG inhalation capsule, Place 1 capsule into inhaler and inhale daily. pm, Disp: , Rfl:  .  topiramate (TOPAMAX) 100 MG tablet, Take 1 tablet by mouth 2 (two) times daily., Disp: , Rfl:  .  traZODone (DESYREL) 50 MG tablet, TAKE 1 TABLET BY MOUTH AT  BEDTIME, Disp: 90 tablet, Rfl: 1 .  Venlafaxine HCl 225 MG TB24, TAKE 1 TABLET BY MOUTH  DAILY, Disp: 90 tablet, Rfl: 1 .  insulin glargine (LANTUS SOLOSTAR) 100 UNIT/ML Solostar Pen, Inject 20 Units into the skin daily., Disp: 15 pen, Rfl: 1 .  insulin lispro (HUMALOG KWIKPEN) 100 UNIT/ML KwikPen, Inject 0.05-0.1 mLs (5-10 Units total) into the skin 3 (three) times daily., Disp: 15 mL, Rfl: 1  Allergies  Allergen Reactions  . Augmentin [Amoxicillin-Pot Clavulanate] Diarrhea  . Penicillins Itching    I personally reviewed active problem list, medication list, allergies, family history, social history, health maintenance with the patient/caregiver  today.   ROS  Constitutional: Negative for fever or weight change.  Respiratory: Negative for cough and shortness of breath.   Cardiovascular: Negative for chest pain or palpitations.  Gastrointestinal: Negative for abdominal pain,  no bowel changes.  Musculoskeletal: Negative for gait problem or joint swelling.  Skin: Negative for rash.  Neurological: Negative for dizziness or headache.  No other specific complaints in a complete review of systems (except as listed in HPI above).  Objective  Vitals:   08/05/19 1041  BP: 110/70  Pulse: (!) 113  Resp: 16  Temp: (!) 96.6 F (35.9 C)  TempSrc: Temporal  SpO2: 96%  Weight: 176 lb 9.6 oz (80.1 kg)  Height: 5' 9" (1.753 m)    Body mass index is 26.08 kg/m.  Physical Exam  Constitutional: Patient appears well-developed and well-nourished. Overweight. No distress.  HEENT: head atraumatic, normocephalic, pupils equal and reactive to light Cardiovascular: Normal rate, regular rhythm and normal heart sounds.  No murmur heard. No BLE edema. Pulmonary/Chest: Effort normal , wheezing and also rhonchi bilaterally  No respiratory distress. Abdominal: Soft.  There is no tenderness. Psychiatric: Patient has a normal mood and affect. behavior is normal. Judgment and thought content normal.   Diabetic Foot Exam: Diabetic Foot Exam - Simple   Simple Foot Form Visual Inspection See comments: Yes Sensation Testing Intact to touch and monofilament testing bilaterally: Yes Pulse Check Posterior Tibialis and Dorsalis pulse intact bilaterally: Yes Comments Thick toenails, hammer toes and bunions       PHQ2/9: Depression screen [facility] 2/9 08/05/2019 05/04/2019 01/27/2019 01/22/2019 08/26/2018  Decreased Interest _0 0  Down, Depressed, Hopeless _1 PHQ - 2 Score _2 Altered sleeping 3 3 0 0 2  Tired, decreased energy _3 Change in appetite _4 Feeling bad or failure about yourself  _5 Trouble  concentrating _6 Moving slowly or fidgety/restless _7 Suicidal thoughts 0 0 0 0 0  PHQ-9 Score _8 Difficult doing work/chores Not difficult at all Not difficult at all Somewhat difficult Somewhat difficult Somewhat difficult  Some recent data might be hidden    phq 9 is positive   Fall Risk: Fall Risk  08/05/2019 05/04/2019 01/27/2019 01/22/2019 08/26/2018  Falls in the past year? 0 _9 Number falls in past yr: 0 _10 Injury with Fall? 0 0 0 0 1  Comment - - - - -  Risk for fall due to : - - Impaired balance/gait;History of fall(s) History of fall(s) History of fall(s);Impaired balance/gait;Impaired vision  Risk for fall due to: Comment - - - - -  Follow up - - Falls prevention discussed Falls evaluation completed -    Functional Status Survey: Is the patient deaf or have difficulty hearing?: No Does the patient have difficulty seeing, even when wearing glasses/contacts?: No Does the patient have difficulty concentrating, remembering, or making decisions?: No Does the patient have difficulty walking or climbing stairs?: Yes Does the patient have difficulty dressing or bathing?: No Does the patient have difficulty doing errands alone such as visiting a doctor's office or shopping?: No    Assessment & Plan  1. Atherosclerosis of aorta (HCC)  - Lipid panel, discussed considering aspirin 81 mg daily   2. Right thyroid nodule  - Thyroid Panel With TSH  3. Centrilobular emphysema ([place])  On medication   4. COPD with asthma ([place])  On medication and stable, under the care of Dr. Raul Del  5. Type 2 diabetes  mellitus with peripheral neuropathy (HCC)  Stable , check A1C   6. DM type 2 with diabetic dyslipidemia (HCC)  - Microalbumin / creatinine urine ratio - Hemoglobin A1c - rosuvastatin (CRESTOR) 5 MG tablet; Take 1 tablet (5 mg total) by mouth at bedtime.  Dispense: 90 tablet; Refill: 1  7. CAFL (chronic airflow limitation)  (HCC)   8. Moderate recurrent major depression ([place])  - Ambulatory referral to Psychiatry  9. Chronic diastolic heart failure (HCC)  Seeing Dr. Ubaldo Glassing   10. Chronic kidney disease (CKD) stage G3a/A2, moderately decreased glomerular filtration rate (GFR) between 45-59 mL/min/1.73 square meter and albuminuria creatinine ratio between 30-299 mg/g  - COMPLETE METABOLIC PANEL WITH GFR - CBC with Differential/Platelet - VITAMIN D 25 Hydroxy (Vit-D Deficiency, Fractures)  11. Dyslipidemia (high LDL; low HDL)  - rosuvastatin (CRESTOR) 5 MG tablet; Take 1 tablet (5 mg total) by mouth at bedtime.  Dispense: 90 tablet; Refill: 1  12. Diabetes mellitus type 2, insulin dependent (HCC)  - metFORMIN (GLUCOPHAGE-XR) 750 MG 24 hr tablet; Take 2 tablets (1,500 mg total) by mouth daily with breakfast.  Dispense: 180 tablet; Refill: 1 - Insulin Pen Needle 32G X 6 MM MISC; 2 each by Does not apply route daily.  Dispense: 200 each; Refill: 2  13. Encounter for screening for HIV  - HIV Antibody (routine testing w rflx)

## 2019-08-05 NOTE — Patient Instructions (Signed)
Pre-meal insulin :  Check fsbs prior to meals  Hold if below 110 If glucose 110- 140 give 4 units If glucose 140 to 180 give 6 units  If glucose 180 to 220  give 8 units If glucose above 220 give 10 units 

## 2019-08-06 ENCOUNTER — Other Ambulatory Visit: Payer: Self-pay | Admitting: Family Medicine

## 2019-08-06 LAB — CBC WITH DIFFERENTIAL/PLATELET
Absolute Monocytes: 398 cells/uL (ref 200–950)
Basophils Absolute: 17 cells/uL (ref 0–200)
Basophils Relative: 0.2 %
Eosinophils Absolute: 33 cells/uL (ref 15–500)
Eosinophils Relative: 0.4 %
HCT: 43.1 % (ref 35.0–45.0)
Hemoglobin: 14.2 g/dL (ref 11.7–15.5)
Lymphs Abs: 1643 cells/uL (ref 850–3900)
MCH: 31.7 pg (ref 27.0–33.0)
MCHC: 32.9 g/dL (ref 32.0–36.0)
MCV: 96.2 fL (ref 80.0–100.0)
MPV: 10 fL (ref 7.5–12.5)
Monocytes Relative: 4.8 %
Neutro Abs: 6208 cells/uL (ref 1500–7800)
Neutrophils Relative %: 74.8 %
Platelets: 206 10*3/uL (ref 140–400)
RBC: 4.48 10*6/uL (ref 3.80–5.10)
RDW: 11.8 % (ref 11.0–15.0)
Total Lymphocyte: 19.8 %
WBC: 8.3 10*3/uL (ref 3.8–10.8)

## 2019-08-06 LAB — COMPLETE METABOLIC PANEL WITH GFR
AG Ratio: 1.6 (calc) (ref 1.0–2.5)
ALT: 8 U/L (ref 6–29)
AST: 7 U/L — ABNORMAL LOW (ref 10–35)
Albumin: 4 g/dL (ref 3.6–5.1)
Alkaline phosphatase (APISO): 98 U/L (ref 37–153)
BUN: 10 mg/dL (ref 7–25)
CO2: 26 mmol/L (ref 20–32)
Calcium: 8.9 mg/dL (ref 8.6–10.4)
Chloride: 103 mmol/L (ref 98–110)
Creat: 0.85 mg/dL (ref 0.50–1.05)
GFR, Est African American: 88 mL/min/{1.73_m2} (ref >=60)
GFR, Est Non African American: 76 mL/min/{1.73_m2} (ref >=60)
Globulin: 2.5 g/dL (calc) (ref 1.9–3.7)
Glucose, Bld: 266 mg/dL — ABNORMAL HIGH (ref 65–99)
Potassium: 4 mmol/L (ref 3.5–5.3)
Sodium: 136 mmol/L (ref 135–146)
Total Bilirubin: 0.3 mg/dL (ref 0.2–1.2)
Total Protein: 6.5 g/dL (ref 6.1–8.1)

## 2019-08-06 LAB — LIPID PANEL
Cholesterol: 148 mg/dL (ref <200)
HDL: 39 mg/dL — ABNORMAL LOW (ref >=50)
LDL Cholesterol (Calc): 80 mg/dL (calc)
Non-HDL Cholesterol (Calc): 109 mg/dL (calc) (ref <130)
Total CHOL/HDL Ratio: 3.8 (calc) (ref <5.0)
Triglycerides: 193 mg/dL — ABNORMAL HIGH (ref <150)

## 2019-08-06 LAB — THYROID PANEL WITH TSH
Free Thyroxine Index: 1.9 (ref 1.4–3.8)
T3 Uptake: 28 % (ref 22–35)
T4, Total: 6.7 ug/dL (ref 5.1–11.9)
TSH: 0.31 mIU/L — ABNORMAL LOW (ref 0.40–4.50)

## 2019-08-06 LAB — MICROALBUMIN / CREATININE URINE RATIO
Creatinine, Urine: 34 mg/dL (ref 20–275)
Microalb Creat Ratio: 35 mcg/mg creat — ABNORMAL HIGH (ref <30)
Microalb, Ur: 1.2 mg/dL

## 2019-08-06 LAB — HEMOGLOBIN A1C
Hgb A1c MFr Bld: 6.8 % of total Hgb — ABNORMAL HIGH (ref <5.7)
Mean Plasma Glucose: 148 (calc)
eAG (mmol/L): 8.2 (calc)

## 2019-08-06 LAB — VITAMIN D 25 HYDROXY (VIT D DEFICIENCY, FRACTURES): Vit D, 25-Hydroxy: 17 ng/mL — ABNORMAL LOW (ref 30–100)

## 2019-08-06 LAB — HIV ANTIBODY (ROUTINE TESTING W REFLEX): HIV 1&2 Ab, 4th Generation: NONREACTIVE

## 2019-08-06 NOTE — Telephone Encounter (Signed)
Requested medication (s) are due for refill today:no  Requested medication (s) are on the active medication list: yes  Last refill: yesterday  Future visit scheduled: yes  Notes to clinic: pharmacy requesting 1 year supply      Requested Prescriptions  Pending Prescriptions Disp Refills   HUMALOG KWIKPEN 100 UNIT/ML KwikPen [Pharmacy Med Name: HUMALOG KWIK 100U/ML 5X3ML SOLUTION] 30 mL 3    Sig: INJECT SUBCUTANEOUSLY 5 TO  10 UNITS 3 TIMES DAILY      Endocrinology:  Diabetes - Insulins Passed - 08/06/2019 12:11 PM      Passed - HBA1C is between 0 and 7.9 and within 180 days    Hemoglobin A1C  Date Value Ref Range Status  10/09/2012 7.4 (H) 4.2 - 6.3 % Final    Comment:    The American Diabetes Association recommends that a primary goal of therapy should be <7% and that physicians should reevaluate the treatment regimen in patients with HbA1c values consistently >8%.    HbA1c, POC (controlled diabetic range)  Date Value Ref Range Status  03/19/2018 13.7 (A) 0.0 - 7.0 % Final   Hgb A1c MFr Bld  Date Value Ref Range Status  08/05/2019 6.8 (H) <5.7 % of total Hgb Final    Comment:    For someone without known diabetes, a hemoglobin A1c value of 6.5% or greater indicates that they may have  diabetes and this should be confirmed with a follow-up  test. . For someone with known diabetes, a value <7% indicates  that their diabetes is well controlled and a value  greater than or equal to 7% indicates suboptimal  control. A1c targets should be individualized based on  duration of diabetes, age, comorbid conditions, and  other considerations. . Currently, no consensus exists regarding use of hemoglobin A1c for diagnosis of diabetes for children. Renella Cunas - Valid encounter within last 6 months    Recent Outpatient Visits           Yesterday Atherosclerosis of aorta [facility])   [facility] [name], MD   3 months ago [facility] Lebron Conners D, MD   6 months ago Moderate persistent asthma with acute exacerbation   [place], [name]   11 months ago Diabetes mellitus type 2, insulin dependent [facility])   [facility] [name], MD   1 year ago Moderate recurrent major depression [facility])   [facility] [name], MD       Future Appointments             In 2 months [name], Drue Stager, MD [facility], [place]   In 4 months [name], MD [facility], [place]   In 5 months  [facility], [facility]

## 2019-08-10 ENCOUNTER — Telehealth: Payer: Self-pay | Admitting: Family Medicine

## 2019-08-10 NOTE — Telephone Encounter (Signed)
Patient is calling because her daughter was told that she had lych syndrome. And that all member of the family needed to be tested. Patient is request a kit to be tested. Patient is wanting to know does she need an ov to obtain the kit? Please advise [phone]

## 2019-08-11 ENCOUNTER — Other Ambulatory Visit: Payer: Self-pay | Admitting: Family Medicine

## 2019-08-11 DIAGNOSIS — R946 Abnormal results of thyroid function studies: Secondary | ICD-10-CM | POA: Diagnosis not present

## 2019-08-11 DIAGNOSIS — Z800 Family history of malignant neoplasm of digestive organs: Secondary | ICD-10-CM

## 2019-08-11 DIAGNOSIS — J411 Mucopurulent chronic bronchitis: Secondary | ICD-10-CM

## 2019-08-11 DIAGNOSIS — R7989 Other specified abnormal findings of blood chemistry: Secondary | ICD-10-CM | POA: Diagnosis not present

## 2019-08-12 NOTE — Telephone Encounter (Signed)
Patient called. Patient verbalized understanding.

## 2019-08-19 DIAGNOSIS — M10049 Idiopathic gout, unspecified hand: Secondary | ICD-10-CM | POA: Diagnosis not present

## 2019-08-19 DIAGNOSIS — M722 Plantar fascial fibromatosis: Secondary | ICD-10-CM | POA: Diagnosis not present

## 2019-08-26 ENCOUNTER — Telehealth: Payer: Self-pay | Admitting: Family Medicine

## 2019-08-31 ENCOUNTER — Telehealth: Payer: Self-pay | Admitting: Family Medicine

## 2019-08-31 NOTE — Telephone Encounter (Signed)
Patient called to request that the doctor call her in a script for Prednisone.  She stated that she took it about a month ago.  Please advise and call to let patient know if this is possible.  CB# [phone]

## 2019-08-31 NOTE — Telephone Encounter (Signed)
I called patient to ask about why she needed a refill on Prednisone. No answer. No VM.

## 2019-09-01 NOTE — Telephone Encounter (Signed)
Will you give her another prescription for Prednisone.

## 2019-09-08 ENCOUNTER — Telehealth: Payer: Self-pay | Admitting: Genetic Counselor

## 2019-09-08 ENCOUNTER — Other Ambulatory Visit: Payer: Self-pay | Admitting: Family Medicine

## 2019-09-08 DIAGNOSIS — J411 Mucopurulent chronic bronchitis: Secondary | ICD-10-CM

## 2019-09-08 NOTE — Telephone Encounter (Signed)
Called Tricia [name] at the request of her daughter, Tricia [name], who was recently diagnosed with MUTYH-associated polyposis (MAP) by genetic testing. We discussed that MAP is a genetic condition that occurs when someone inherits two mutations in the MUTYH gene - one from each parent (autosomal recessive inheritance). Individuals with MAP have an increased risk for colon polyps, colon cancer, and other cancer types.   Because of its autosomal recessive inheritance, we expect that Tricia [name] has at least one MUTYH mutation. People who only have one MUTYH mutation are called carriers and do not have MAP, but may have a slightly increased risk for colon cancer.   We discussed genetic testing, including the costs associated with testing. There should not be a charge associated with the test itself if completed through the same genetic testing laboratory ([facility]) within 150 days of her daughter's test. However, there may be a charge associated with a genetic counseling appointment or another doctor's appointment to set up testing. The cost of the appointment depends on her insurance plan - she may be able to learn more information by calling her insurance. We will call back with the appropriate CPT code that may be needed for this conversation with insurance.

## 2019-09-18 ENCOUNTER — Ambulatory Visit: Payer: Self-pay | Admitting: *Deleted

## 2019-09-18 ENCOUNTER — Other Ambulatory Visit: Payer: Self-pay

## 2019-09-18 ENCOUNTER — Emergency Department: Payer: Medicare Other

## 2019-09-18 DIAGNOSIS — F17210 Nicotine dependence, cigarettes, uncomplicated: Secondary | ICD-10-CM | POA: Insufficient documentation

## 2019-09-18 DIAGNOSIS — Z85028 Personal history of other malignant neoplasm of stomach: Secondary | ICD-10-CM | POA: Insufficient documentation

## 2019-09-18 DIAGNOSIS — Z7951 Long term (current) use of inhaled steroids: Secondary | ICD-10-CM | POA: Insufficient documentation

## 2019-09-18 DIAGNOSIS — Z79899 Other long term (current) drug therapy: Secondary | ICD-10-CM | POA: Diagnosis not present

## 2019-09-18 DIAGNOSIS — I10 Essential (primary) hypertension: Secondary | ICD-10-CM | POA: Diagnosis not present

## 2019-09-18 DIAGNOSIS — Z794 Long term (current) use of insulin: Secondary | ICD-10-CM | POA: Insufficient documentation

## 2019-09-18 DIAGNOSIS — I5032 Chronic diastolic (congestive) heart failure: Secondary | ICD-10-CM | POA: Diagnosis not present

## 2019-09-18 DIAGNOSIS — E1140 Type 2 diabetes mellitus with diabetic neuropathy, unspecified: Secondary | ICD-10-CM | POA: Insufficient documentation

## 2019-09-18 DIAGNOSIS — J449 Chronic obstructive pulmonary disease, unspecified: Secondary | ICD-10-CM | POA: Diagnosis not present

## 2019-09-18 DIAGNOSIS — Z85038 Personal history of other malignant neoplasm of large intestine: Secondary | ICD-10-CM | POA: Diagnosis not present

## 2019-09-18 DIAGNOSIS — Z20822 Contact with and (suspected) exposure to covid-19: Secondary | ICD-10-CM | POA: Diagnosis not present

## 2019-09-18 DIAGNOSIS — J40 Bronchitis, not specified as acute or chronic: Secondary | ICD-10-CM | POA: Diagnosis not present

## 2019-09-18 DIAGNOSIS — Z86010 Personal history of colonic polyps: Secondary | ICD-10-CM | POA: Insufficient documentation

## 2019-09-18 DIAGNOSIS — R05 Cough: Secondary | ICD-10-CM | POA: Diagnosis not present

## 2019-09-18 NOTE — Telephone Encounter (Signed)
Patient is having SOB- that has increasing gotten worse over the past month. Patient states her sputum is discolored and nasty. Patient had tried some medications she had "left over" at home and she did not get better. Patient states she had been sick for 1 month. Advised patient to go to ED per protocol- she has to be seen and evaluated. She agrees to go.  Reason for Disposition . [1] MODERATE difficulty breathing (e.g., speaks in phrases, SOB even at rest, pulse 100-120) AND [2] NEW-onset or WORSE than normal  Answer Assessment - Initial Assessment Questions 1. RESPIRATORY STATUS: "Describe your breathing?" (e.g., wheezing, shortness of breath, unable to speak, severe coughing)      SOB- when walking 2. ONSET: "When did this breathing problem begin?"      1 month ago 3. PATTERN "Does the difficult breathing come and go, or has it been constant since it started?"      constant 4. SEVERITY: "How bad is your breathing?" (e.g., mild, moderate, severe)    - MILD: No SOB at rest, mild SOB with walking, speaks normally in sentences, can lay down, no retractions, pulse < 100.    - MODERATE: SOB at rest, SOB with minimal exertion and prefers to sit, cannot lie down flat, speaks in phrases, mild retractions, audible wheezing, pulse 100-120.    - SEVERE: Very SOB at rest, speaks in single words, struggling to breathe, sitting hunched forward, retractions, pulse > 120      moderate 5. RECURRENT SYMPTOM: "Have you had difficulty breathing before?" If Yes, ask: "When was the last time?" and "What happened that time?"      Yes- COPD- treated with prednisone 6. CARDIAC HISTORY: "Do you have any history of heart disease?" (e.g., heart attack, angina, bypass surgery, angioplasty)      Heart disease 7. LUNG HISTORY: "Do you have any history of lung disease?"  (e.g., pulmonary embolus, asthma, emphysema)     COPD 8. CAUSE: "What do you think is causing the breathing problem?"      Not sure 9. OTHER SYMPTOMS:  "Do you have any other symptoms? (e.g., dizziness, runny nose, cough, chest pain, fever)     Dizziness,cough, taste is off 10. PREGNANCY: "Is there any chance you are pregnant?" "When was your last menstrual period?"       n/a 11. TRAVEL: "Have you traveled out of the country in the last month?" (e.g., travel history, exposures)       no  Protocols used: BREATHING DIFFICULTY-A-AH

## 2019-09-18 NOTE — ED Triage Notes (Signed)
Pt arrives via POV for reports of cough x 1 month. Pt coughing up thick green mucus. Pt reports she took some leftover doxycycline but only had a few pills left over. Denies fever. Pt in NAD, skin warm and dry. No shob observed.

## 2019-09-18 NOTE — ED Triage Notes (Signed)
Called no answer

## 2019-09-18 NOTE — Telephone Encounter (Signed)
Reviewed Tricia [name] insurance information with her - the out of pocket cost for a genetic counseling visit should be $35 based on her plan (copay for a specialist visit). [name] will reach back out to [place] when she would like to schedule a genetic counseling appointment.

## 2019-09-19 ENCOUNTER — Emergency Department
Admission: EM | Admit: 2019-09-19 | Discharge: 2019-09-19 | Disposition: A | Discharge status: Home / Self Care | Payer: Medicare Other | Attending: Emergency Medicine | Admitting: Emergency Medicine

## 2019-09-19 DIAGNOSIS — J40 Bronchitis, not specified as acute or chronic: Secondary | ICD-10-CM

## 2019-09-19 DIAGNOSIS — J449 Chronic obstructive pulmonary disease, unspecified: Secondary | ICD-10-CM

## 2019-09-19 LAB — SARS CORONAVIRUS 2 (TAT 6-24 HRS): SARS Coronavirus 2: NEGATIVE

## 2019-09-19 MED ORDER — PREDNISONE 20 MG PO TABS: ORAL_TABLET | ORAL | 0 refills | Status: DC

## 2019-09-19 MED ORDER — HYDROCOD POLST-CPM POLST ER 10-8 MG/5ML PO SUER
5.0000 mL | Freq: Once | ORAL | Status: AC
  Administered 2019-09-19: 5 mL via ORAL
  Filled 2019-09-19: qty 5

## 2019-09-19 MED ORDER — PREDNISONE 20 MG PO TABS
60.0000 mg | ORAL_TABLET | Freq: Once | ORAL | Status: AC
  Administered 2019-09-19: 60 mg via ORAL
  Filled 2019-09-19: qty 3

## 2019-09-19 MED ORDER — HYDROCOD POLST-CPM POLST ER 10-8 MG/5ML PO SUER: 5.0000 mL | Freq: Two times a day (BID) | ORAL | 0 refills | Status: DC

## 2019-09-19 NOTE — ED Notes (Signed)
Pt states coming in due to Shortness of breath and a cough. Pt states she has had a could for a while. Pt states history of COPD

## 2019-09-19 NOTE — Discharge Instructions (Signed)
1.  Finish Prednisone 60 mg daily x4 days.  Start your next dose Sunday morning. 2.  You may take Tussionex as needed for cough. 3.  Your Covid result is pending.  You may check your results in MyChart in 1 to 2 days.  We will call you with positive results. 4.  Return to the ER for worsening symptoms, persistent vomiting, difficulty breathing or other concerns.

## 2019-09-19 NOTE — ED Provider Notes (Signed)
[facility] Emergency Department Provider Note   ____________________________________________   First MD Initiated Contact with Patient 09/19/19 0030     (approximate)  I have reviewed the triage vital signs and the nursing notes.   HISTORY  Chief Complaint Cough    HPI Tricia [name] is a 59 y.o. female who presents to the ED from home with a chief complaint of cough x1 month.  Patient has a history of COPD, not on continuous oxygenation who has been experiencing cough productive of green sputum x1 month.  Occasionally associated with wheezing.  Took some leftover doxycycline.  Denies fever, chills, shortness of breath, abdominal pain, nausea, vomiting or diarrhea.  Patient has had both doses of her COVID-19 vaccination.       Past Medical History:  Diagnosis Date   Anxiety    Arthritis    joints and hands/ knees   Asthma    uses inhaler   Benign essential tremor    head   Cervical dystonia    neck pain   Cholesteatoma of left ear    x2   COPD (chronic obstructive pulmonary disease) (HCC)    Cough    Depression    Diabetes mellitus without complication (HCC)    type 2   Diastolic dysfunction    Dyspnea    Dysrhythmia    diastolic dysfunction   GERD (gastroesophageal reflux disease)    Headache    migraines/ one per week   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   Hyperlipidemia    Hypertension    Motion sickness    boat   Neuromuscular disorder ([place])    neuropathy feet and hands( nerve damage)   Wears dentures    upper and lower    Patient Active Problem List   Diagnosis Date Noted   Nonrheumatic mitral valve regurgitation 05/04/2019   Benign neoplasm of descending colon    Polyp of sigmoid colon    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   Polyneuropathy 10/21/2014   Chronic venous insufficiency 10/05/2014   Bilateral leg edema 08/16/2014   Major depression in partial  remission ([place]) 08/16/2014   Acid reflux 08/16/2014   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   Asthma, moderate persistent 08/16/2014   Carpal tunnel syndrome 08/16/2014   Cervical pain 08/16/2014   CAFL (chronic airflow limitation) ([place]) 08/16/2014   Type 2 diabetes mellitus with peripheral neuropathy ([place]) 08/16/2014   Diabetes mellitus type 2, insulin dependent ([place]) 08/16/2014   Dyslipidemia 08/16/2014   Current smoker 08/16/2014   Essential (primary) hypertension 08/16/2014   Benign neoplasm of stomach 08/16/2014   Gout 08/16/2014   HLD (hyperlipidemia) 08/16/2014   Low back pain 08/16/2014   Lumbar radiculopathy 08/16/2014   Headache, migraine 08/16/2014   Arthralgia of multiple joints 08/16/2014   Avitaminosis D 08/16/2014   Primary osteoarthritis of both knees 06/22/2014   Benign essential tremor 10/02/2013   Cervical dystonia 10/02/2013   Chronic diastolic heart failure ([place]) 11/16/2012   Chronic pain 11/13/2012    Past Surgical History:  Procedure Laterality Date   CARPAL TUNNEL RELEASE Bilateral    x2 right, 1x on left   COLONOSCOPY     COLONOSCOPY WITH PROPOFOL N/A 04/25/2017   Procedure: COLONOSCOPY WITH PROPOFOL;  Surgeon: [name], MD;  Location: [facility];  Service: Endoscopy;  Laterality: N/A;  diabetic-oral med   DILATION AND CURETTAGE OF UTERUS     EXTERNAL EAR SURGERY Left  x2   POLYPECTOMY  04/25/2017   Procedure: POLYPECTOMY INTESTINAL;  Surgeon: [name], MD;  Location: [facility];  Service: Endoscopy;;   SPINE SURGERY     herniated disc   TUBAL LIGATION      Prior to Admission medications   Medication Sig Start Date End Date Taking? Authorizing Provider  albuterol (VENTOLIN HFA) 108 (90 Base) MCG/ACT inhaler INHALE 1 PUFF BY MOUTH AS NEEDED 09/08/19   [name], Drue Stager, MD  blood glucose meter kit and supplies KIT Dispense accuchek aviva plus; can change if needed e11.9 12/27/17   [name],  [name], NP  budesonide-formoterol (SYMBICORT) 160-4.5 MCG/ACT inhaler USE 2 INHALATIONS INTO THE  LUNGS 2 TIMES DAILY 06/08/19   Erby Pian, MD  busPIRone (BUSPAR) 7.5 MG tablet Take 7.5 mg by mouth 2 (two) times daily. 08/04/19   [provider]  cetirizine (ZYRTEC) 10 MG tablet Take 10 mg by mouth as needed for allergies.    [provider]  chlorpheniramine-HYDROcodone (TUSSIONEX PENNKINETIC ER) 10-8 MG/5ML SUER Take 5 mLs by mouth 2 (two) times daily. 09/19/19   Paulette Blanch, MD  colchicine (COLCRYS) 0.6 MG tablet Take 1 tablet (0.6 mg total) by mouth daily. 08/05/19   [name], MD  cyclobenzaprine (FLEXERIL) 10 MG tablet Take 10 mg by mouth 3 (three) times daily. 08/04/19   [provider]  furosemide (LASIX) 40 MG tablet Take 1 tablet (40 mg total) by mouth daily as needed. Patient taking differently: Take 20 mg by mouth daily as needed. Was told to drop to 12m because BP was getting too low 06/21/17   [name], EBethel Born NP  gabapentin (NEURONTIN) 600 MG tablet Take 1 tablet (600 mg total) by mouth 3 (three) times daily. 06/21/17   [name], EBethel Born NP  glucose blood test strip Use as directed to check blood glucose daily 06/21/17   [name], EBethel Born NP  HUMALOG KWIKPEN 100 UNIT/ML KwikPen INJECT SUBCUTANEOUSLY 5 TO  10 UNITS 3 TIMES DAILY 08/06/19   SSteele Sizer MD  hydrOXYzine (ATARAX/VISTARIL) 25 MG tablet Take 25 mg by mouth 2 (two) times daily. 08/04/19   [provider]  insulin glargine (LANTUS SOLOSTAR) 100 UNIT/ML Solostar Pen Inject 20 Units into the skin daily. 08/05/19   SSteele Sizer MD  Insulin Pen Needle 32G X 6 MM MISC 2 each by Does not apply route daily. 08/05/19   SSteele Sizer MD  ipratropium-albuterol (DUONEB) 0.5-2.5 (3) MG/3ML SOLN Inhale 3 mLs into the lungs every 6 (six) hours as needed. 06/21/17   [name], EBethel Born NP  metFORMIN (GLUCOPHAGE-XR) 750 MG 24 hr tablet Take 2 tablets (1,500 mg total) by mouth  daily with breakfast. 08/05/19   SAncil Boozer KDrue Stager MD  montelukast (SINGULAIR) 10 MG tablet TAKE 1 TABLET BY MOUTH  DAILY IN THE AFTERNOON 04/02/19   SSteele Sizer MD  morphine (AVINZA) 30 MG 24 hr capsule  02/27/19   [provider]  morphine (MS CONTIN) 15 MG 12 hr tablet Take 1 tablet by mouth 2 (two) times daily. 11/20/16   DLeone Payor MD  omeprazole (PRILOSEC) 40 MG capsule Take 1 capsule (40 mg total) by mouth daily. 08/05/19 08/04/20  SSteele Sizer MD  oxyCODONE (ROXICODONE) 15 MG immediate release tablet Take 15 mg by mouth every 6 (six) hours as needed for pain.    DLeone Payor MD  predniSONE (DELTASONE) 20 MG tablet 3 tablets daily x 4 days 09/19/19   SPaulette Blanch MD  promethazine ([facility] 25  MG tablet  04/28/18   [provider]  RELISTOR 150 MG TABS  05/26/18   [name], MD  rizatriptan (MAXALT) 5 MG tablet Take 5 mg by mouth as needed. 01/22/14   [name], MD  rosuvastatin (CRESTOR) 5 MG tablet Take 1 tablet (5 mg total) by mouth at bedtime. 08/05/19   [name], MD  theophylline (UNIPHYL) 400 MG 24 hr tablet Take 1 tablet by mouth daily. 12/27/17   Erby Pian, MD  tiotropium (SPIRIVA HANDIHALER) 18 MCG inhalation capsule Place 1 capsule into inhaler and inhale daily. pm 08/27/13   Erby Pian, MD  topiramate (TOPAMAX) 100 MG tablet Take 1 tablet by mouth 2 (two) times daily. 10/12/16   [name], MD  traZODone (DESYREL) 50 MG tablet TAKE 1 TABLET BY MOUTH AT  BEDTIME 06/08/19   [name], MD  Venlafaxine HCl 225 MG TB24 TAKE 1 TABLET BY MOUTH  DAILY 06/08/19   [name], MD    Allergies Augmentin [amoxicillin-pot clavulanate] and Penicillins  Family History  Problem Relation Age of Onset   Emphysema Mother    Stroke Father    Throat cancer Father    Multiple sclerosis Daughter    Bipolar disorder Daughter    Cervical cancer Daughter    Bipolar disorder Daughter    Lung cancer Maternal Aunt    Lung cancer  Maternal Uncle    Lung cancer Maternal Grandmother    Lung cancer Maternal Grandfather     Social History Social History   Tobacco Use   Smoking status: Current Every Day Smoker    Packs/day: 1.00    Years: 41.00    Pack years: 41.00    Types: Cigarettes    Start date: 05/19/1977   Smokeless tobacco: Never Used  Vaping Use   Vaping Use: Former  Substance Use Topics   Alcohol use: No    Alcohol/week: 0.0 standard drinks   Drug use: No    Review of Systems  Constitutional: No fever/chills Eyes: No visual changes. ENT: No sore throat. Cardiovascular: Denies chest pain. Respiratory: Positive for productive cough.  Denies shortness of breath. Gastrointestinal: No abdominal pain.  No nausea, no vomiting.  No diarrhea.  No constipation. Genitourinary: Negative for dysuria. Musculoskeletal: Negative for back pain. Skin: Negative for rash. Neurological: Negative for headaches, focal weakness or numbness.   ____________________________________________   PHYSICAL EXAM:  VITAL SIGNS: ED Triage Vitals  Enc Vitals Group     BP 09/18/19 1850 122/77     Pulse Rate 09/18/19 1850 90     Resp 09/18/19 1850 18     Temp 09/18/19 1850 98.2 F (36.8 C)     Temp Source 09/18/19 1850 Oral     SpO2 09/18/19 1850 96 %     Weight 09/18/19 1854 184 lb (83.5 kg)     Height 09/18/19 1854 _0  (1.753 m)     Head Circumference --      Peak Flow --      Pain Score 09/19/19 0018 9     Pain Loc --      Pain Edu? --      Excl. in [place]? --     Constitutional: Alert and oriented. Well appearing and in no acute distress. Eyes: Conjunctivae are normal. PERRL. EOMI. Head: Atraumatic. Nose: No congestion/rhinnorhea. Mouth/Throat: Mucous membranes are moist.  Oropharynx non-erythematous. Neck: No stridor.   Cardiovascular: Normal rate, regular rhythm. Grossly normal heart sounds.  Good peripheral circulation. Respiratory:  Normal respiratory effort.  No retractions. Lungs CTAB.  No  wheezing. Gastrointestinal: Soft and nontender. No distention. No abdominal bruits. No CVA tenderness. Musculoskeletal: No lower extremity tenderness nor edema.  No joint effusions. Neurologic:  Normal speech and language. No gross focal neurologic deficits are appreciated. No gait instability. Skin:  Skin is warm, dry and intact. No rash noted. Psychiatric: Mood and affect are normal. Speech and behavior are normal.  ____________________________________________   LABS (all labs ordered are listed, but only abnormal results are displayed)  Labs Reviewed  SARS CORONAVIRUS 2 (TAT 6-24 HRS)   ____________________________________________  EKG  None ____________________________________________  RADIOLOGY  ED MD interpretation: No acute cardiopulmonary process  Official radiology report(s): DG Chest 2 View  Result Date: 09/18/2019 CLINICAL DATA:  Cough for 1 month EXAM: CHEST - 2 VIEW COMPARISON:  08/27/2018 FINDINGS: The heart size and mediastinal contours are within normal limits. Both lungs are clear. The visualized skeletal structures are unremarkable. IMPRESSION: No active cardiopulmonary disease. Electronically Signed   By: Donavan Foil M.D.   On: 09/18/2019 21:41    ____________________________________________   PROCEDURES  Procedure(s) performed (including Critical Care):  Procedures   ____________________________________________   INITIAL IMPRESSION / ASSESSMENT AND PLAN / ED COURSE  As part of my medical decision making, I reviewed the following data within the [facility] notes reviewed and incorporated, Old chart reviewed, Radiograph reviewed and Notes from prior ED visits     CEAIRA ERNSTER was evaluated in Emergency Department on 09/19/2019 for the symptoms described in the history of present illness. She was evaluated in the context of the global COVID-19 pandemic, which necessitated consideration that the patient might be at  risk for infection with the SARS-CoV-2 virus that causes COVID-19. Institutional protocols and algorithms that pertain to the evaluation of patients at risk for COVID-19 are in a state of rapid change based on information released by regulatory bodies including the CDC and federal and state organizations. These policies and algorithms were followed during the patient's care in the ED.    59 year old female with COPD presenting with cough x1 month. Differential includes, but is not limited to, viral syndrome, bronchitis including COPD exacerbation, pneumonia, reactive airway disease including asthma, CHF including exacerbation with or without pulmonary/interstitial edema, pneumothorax, ACS, thoracic trauma, and pulmonary embolism.  Chest x-ray unremarkable.  Patient's daughter did test positive for COVID-19.  Given her close contact, will obtain COVID-19 swab.  Will place on prednisone burst, Tussionex to use as needed.  Strict return precautions given.  Patient verbalizes understanding agrees with plan of care.        ____________________________________________   FINAL CLINICAL IMPRESSION(S) / ED DIAGNOSES  Final diagnoses:  Bronchitis  Chronic obstructive pulmonary disease, unspecified COPD type [facility])     ED Discharge Orders         Ordered    predniSONE (DELTASONE) 20 MG tablet     Discontinue  Reprint     09/19/19 0052    chlorpheniramine-HYDROcodone (TUSSIONEX PENNKINETIC ER) 10-8 MG/5ML SUER  2 times daily     Discontinue  Reprint     09/19/19 0052           Note:  This document was prepared using Dragon voice recognition software and may include unintentional dictation errors.   Paulette Blanch, MD 09/19/19 [phone]

## 2019-09-24 DIAGNOSIS — M545 Low back pain: Secondary | ICD-10-CM | POA: Diagnosis not present

## 2019-09-24 DIAGNOSIS — M5416 Radiculopathy, lumbar region: Secondary | ICD-10-CM | POA: Diagnosis not present

## 2019-09-24 DIAGNOSIS — Z79899 Other long term (current) drug therapy: Secondary | ICD-10-CM | POA: Diagnosis not present

## 2019-09-24 DIAGNOSIS — G894 Chronic pain syndrome: Secondary | ICD-10-CM | POA: Diagnosis not present

## 2019-09-24 DIAGNOSIS — Z79891 Long term (current) use of opiate analgesic: Secondary | ICD-10-CM | POA: Diagnosis not present

## 2019-09-24 DIAGNOSIS — M542 Cervicalgia: Secondary | ICD-10-CM | POA: Diagnosis not present

## 2019-10-06 DIAGNOSIS — G243 Spasmodic torticollis: Secondary | ICD-10-CM | POA: Diagnosis not present

## 2019-10-06 DIAGNOSIS — G250 Essential tremor: Secondary | ICD-10-CM | POA: Diagnosis not present

## 2019-10-06 DIAGNOSIS — G5603 Carpal tunnel syndrome, bilateral upper limbs: Secondary | ICD-10-CM | POA: Diagnosis not present

## 2019-10-06 DIAGNOSIS — G43719 Chronic migraine without aura, intractable, without status migrainosus: Secondary | ICD-10-CM | POA: Diagnosis not present

## 2019-10-06 DIAGNOSIS — G20 Parkinson's disease: Secondary | ICD-10-CM | POA: Diagnosis not present

## 2019-10-13 ENCOUNTER — Encounter: Payer: Self-pay | Admitting: Psychiatry

## 2019-10-13 ENCOUNTER — Telehealth: Payer: Self-pay

## 2019-10-13 ENCOUNTER — Telehealth (INDEPENDENT_AMBULATORY_CARE_PROVIDER_SITE_OTHER): Payer: Medicare Other | Admitting: Psychiatry

## 2019-10-13 ENCOUNTER — Other Ambulatory Visit: Payer: Self-pay

## 2019-10-13 DIAGNOSIS — F17200 Nicotine dependence, unspecified, uncomplicated: Secondary | ICD-10-CM

## 2019-10-13 DIAGNOSIS — F411 Generalized anxiety disorder: Secondary | ICD-10-CM | POA: Diagnosis not present

## 2019-10-13 DIAGNOSIS — Z87898 Personal history of other specified conditions: Secondary | ICD-10-CM

## 2019-10-13 DIAGNOSIS — Z9189 Other specified personal risk factors, not elsewhere classified: Secondary | ICD-10-CM | POA: Insufficient documentation

## 2019-10-13 DIAGNOSIS — F331 Major depressive disorder, recurrent, moderate: Secondary | ICD-10-CM | POA: Insufficient documentation
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
## 2019-10-13 NOTE — Telephone Encounter (Signed)
Copied from [place] [phone]. Topic: Referral - Request for Referral >> Oct 13, 2019 11:08 AM Gillis Ends D wrote: Has patient seen PCP for this complaint? Yes.   *If NO, is insurance requiring patient see PCP for this issue before PCP can refer them? Referral for which specialty:Cardiology Preferred provider/office: [place] Reason for referral: Patient needs an EKG per Dr. Jennell Corner

## 2019-10-13 NOTE — Telephone Encounter (Signed)
Patient called. Patient has been scheduled for EKG.

## 2019-10-14 ENCOUNTER — Ambulatory Visit: Payer: Medicare Other | Admitting: Family Medicine

## 2019-10-14 ENCOUNTER — Ambulatory Visit (INDEPENDENT_AMBULATORY_CARE_PROVIDER_SITE_OTHER): Payer: Medicare Other | Admitting: Family Medicine

## 2019-10-14 ENCOUNTER — Encounter: Payer: Self-pay | Admitting: Family Medicine

## 2019-10-14 VITALS — BP 110/70 | HR 106 | Temp 98.0°F | Resp 16 | Ht 69.0 in | Wt 172.3 lb

## 2019-10-14 DIAGNOSIS — F331 Major depressive disorder, recurrent, moderate: Secondary | ICD-10-CM

## 2019-10-14 DIAGNOSIS — G20 Parkinson's disease: Secondary | ICD-10-CM | POA: Diagnosis not present

## 2019-10-14 DIAGNOSIS — Z23 Encounter for immunization: Secondary | ICD-10-CM | POA: Diagnosis not present

## 2019-10-14 DIAGNOSIS — F411 Generalized anxiety disorder: Secondary | ICD-10-CM

## 2019-10-14 DIAGNOSIS — G20A1 Parkinson's disease without dyskinesia, without mention of fluctuations: Secondary | ICD-10-CM

## 2019-10-14 NOTE — Progress Notes (Signed)
Name: Tricia [name]   MRN: [id number]    DOB: 03-Nov-1960   Date:10/14/2019       Progress Note  Subjective  Chief Complaint  Chief Complaint  Patient presents with  . Abnormal ECG    HPI  GAD/MDD : under the care of Dr. [name] and was advised to come in for EKG since she is taking medication that can cause prolonged QT, she denies chest pain or problems with heart rhythm.   Parkinson's: recently diagnosed with parkinson's she has a long history of essential tremors but went to [facility] last week and PA under Dr. Trena Platt supervision diagnosed her with right hemibody parkinson's   Patient Active Problem List   Diagnosis Date Noted  . GAD (generalized anxiety disorder) 10/13/2019  . MDD (major depressive disorder), recurrent episode, moderate ([place]) 10/13/2019  . At risk for long QT syndrome 10/13/2019  . Nonrheumatic mitral valve regurgitation 05/04/2019  . Benign neoplasm of descending colon   . Polyp of sigmoid colon   . Steroid-induced diabetes ([place]) 02/25/2017  . Polyneuropathy 10/21/2014  . Chronic venous insufficiency 10/05/2014  . Bilateral leg edema 08/16/2014  . Major depression in partial remission ([place]) 08/16/2014  . Acid reflux 08/16/2014  . 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 Asthma, moderate persistent 08/16/2014  . Carpal tunnel syndrome 08/16/2014  . Cervical pain 08/16/2014  . CAFL (chronic airflow limitation) ([place]) 08/16/2014  . Type 2 diabetes mellitus with peripheral neuropathy ([place]) 08/16/2014  . Diabetes mellitus type 2, insulin dependent ([place]) 08/16/2014  . Dyslipidemia 08/16/2014  . Tobacco use disorder 08/16/2014  . Essential (primary) hypertension 08/16/2014  . Benign neoplasm of stomach 08/16/2014  . Gout 08/16/2014  . HLD (hyperlipidemia) 08/16/2014  . Low back pain 08/16/2014  . Lumbar radiculopathy 08/16/2014  . Headache, migraine 08/16/2014  . Arthralgia of multiple joints 08/16/2014  . Avitaminosis D 08/16/2014  .  Primary osteoarthritis of both knees 06/22/2014  . Benign essential tremor 10/02/2013  . Cervical dystonia 10/02/2013  . Chronic diastolic heart failure ([place]) 11/16/2012  . Chronic pain 11/13/2012    Past Surgical History:  Procedure Laterality Date  . CARPAL TUNNEL RELEASE Bilateral    x2 right, 1x on left  . COLONOSCOPY    . COLONOSCOPY WITH PROPOFOL N/A 04/25/2017   Procedure: COLONOSCOPY WITH PROPOFOL;  Surgeon: [name], MD;  Location: [facility];  Service: Endoscopy;  Laterality: N/A;  diabetic-oral med  . DILATION AND CURETTAGE OF UTERUS    . EXTERNAL EAR SURGERY Left    x2  . POLYPECTOMY  04/25/2017   Procedure: POLYPECTOMY INTESTINAL;  Surgeon: [name], MD;  Location: [facility];  Service: Endoscopy;;  . SPINE SURGERY     herniated disc  . TUBAL LIGATION      Family History  Problem Relation Age of Onset  . Emphysema Mother   . Anxiety disorder Mother   . Stroke Father   . Throat cancer Father   . Multiple sclerosis Daughter   . Bipolar disorder Daughter   . Cervical cancer Daughter   . Bipolar disorder Daughter   . Drug abuse Daughter   . Lung cancer Maternal Aunt   . Lung cancer Maternal Uncle   . Lung cancer Maternal Grandmother   . Lung cancer Maternal Grandfather     Social History   Tobacco Use  . Smoking status: Current Every Day Smoker    Packs/day: 1.00    Years: 41.00    Pack years: 41.00  Types: Cigarettes    Start date: 05/19/1977  . Smokeless tobacco: Never Used  Substance Use Topics  . Alcohol use: No    Alcohol/week: 0.0 standard drinks     Current Outpatient Medications:  .  albuterol (VENTOLIN HFA) 108 (90 Base) MCG/ACT inhaler, INHALE 1 PUFF BY MOUTH AS NEEDED, Disp: 18 g, Rfl: 1 .  blood glucose meter kit and supplies KIT, Dispense accuchek aviva plus; can change if needed e11.9, Disp: 1 each, Rfl: 0 .  budesonide-formoterol (SYMBICORT) 160-4.5 MCG/ACT inhaler, USE 2 INHALATIONS INTO THE  LUNGS 2 TIMES  DAILY, Disp: , Rfl:  .  busPIRone (BUSPAR) 7.5 MG tablet, Take 7.5 mg by mouth 2 (two) times daily. , Disp: , Rfl:  .  cetirizine (ZYRTEC) 10 MG tablet, Take 10 mg by mouth as needed for allergies. , Disp: , Rfl:  .  colchicine (COLCRYS) 0.6 MG tablet, Take 1 tablet (0.6 mg total) by mouth daily., Disp: 30 tablet, Rfl: 0 .  cyclobenzaprine (FLEXERIL) 10 MG tablet, Take 10 mg by mouth 3 (three) times daily. , Disp: , Rfl:  .  Fremanezumab-vfrm 225 MG/1.5ML SOAJ, Inject into the skin. , Disp: , Rfl:  .  furosemide (LASIX) 40 MG tablet, Take 1 tablet (40 mg total) by mouth daily as needed., Disp: 30 tablet, Rfl: 2 .  gabapentin (NEURONTIN) 600 MG tablet, Take 1 tablet (600 mg total) by mouth 3 (three) times daily., Disp: 90 tablet, Rfl: 3 .  glucose blood test strip, Use as directed to check blood glucose daily, Disp: 100 each, Rfl: 2 .  HUMALOG KWIKPEN 100 UNIT/ML KwikPen, INJECT SUBCUTANEOUSLY 5 TO  10 UNITS 3 TIMES DAILY, Disp: 30 mL, Rfl: 2 .  hydroCHLOROthiazide (MICROZIDE PO), hydrochlorothiazide, Disp: , Rfl:  .  hydrOXYzine (ATARAX/VISTARIL) 25 MG tablet, Take 25 mg by mouth 2 (two) times daily. , Disp: , Rfl:  .  ibuprofen (ADVIL) 600 MG tablet, Take by mouth. , Disp: , Rfl:  .  indomethacin (INDOCIN) 50 MG capsule, indomethacin 50 mg capsule, Disp: , Rfl:  .  insulin glargine (LANTUS SOLOSTAR) 100 UNIT/ML Solostar Pen, Inject 20 Units into the skin daily., Disp: 15 pen, Rfl: 1 .  Insulin Pen Needle 32G X 6 MM MISC, 2 each by Does not apply route daily., Disp: 200 each, Rfl: 2 .  ipratropium-albuterol (DUONEB) 0.5-2.5 (3) MG/3ML SOLN, Inhale 3 mLs into the lungs every 6 (six) hours as needed., Disp: 360 mL, Rfl: 3 .  metFORMIN (GLUCOPHAGE-XR) 750 MG 24 hr tablet, Take 2 tablets (1,500 mg total) by mouth daily with breakfast., Disp: 180 tablet, Rfl: 1 .  montelukast (SINGULAIR) 10 MG tablet, TAKE 1 TABLET BY MOUTH  DAILY IN THE AFTERNOON, Disp: 90 tablet, Rfl: 2 .  morphine (MS CONTIN) 30 MG  12 hr tablet, morphine ER 30 mg tablet,extended release, Disp: , Rfl:  .  naproxen (NAPROSYN) 500 MG tablet, Take 500 mg by mouth 2 (two) times daily. , Disp: , Rfl:  .  omeprazole (PRILOSEC) 40 MG capsule, Take 1 capsule (40 mg total) by mouth daily., Disp: 90 capsule, Rfl: 1 .  Oxycodone HCl 10 MG TABS, Take 10 mg by mouth 4 (four) times daily as needed. , Disp: , Rfl:  .  promethazine (PHENERGAN) 25 MG tablet, , Disp: , Rfl:  .  RELISTOR 150 MG TABS, , Disp: , Rfl:  .  rosuvastatin (CRESTOR) 5 MG tablet, Take 1 tablet (5 mg total) by mouth at bedtime., Disp: 90 tablet, Rfl:  1 .  theophylline (UNIPHYL) 400 MG 24 hr tablet, Take 1 tablet by mouth daily. , Disp: , Rfl:  .  tiotropium (SPIRIVA HANDIHALER) 18 MCG inhalation capsule, Place 1 capsule into inhaler and inhale daily. pm, Disp: , Rfl:  .  topiramate (TOPAMAX) 100 MG tablet, Take 1 tablet by mouth 2 (two) times daily. , Disp: , Rfl:  .  traZODone (DESYREL) 50 MG tablet, TAKE 1 TABLET BY MOUTH AT  BEDTIME, Disp: 90 tablet, Rfl: 1 .  Venlafaxine HCl 225 MG TB24, TAKE 1 TABLET BY MOUTH  DAILY, Disp: 90 tablet, Rfl: 1 .  Oxycodone HCl 10 MG TABS, oxycodone 10 mg tablet (Patient not taking: Reported on 10/14/2019), Disp: , Rfl:   Allergies  Allergen Reactions  . Augmentin [Amoxicillin-Pot Clavulanate] Diarrhea  . Penicillins Itching    I personally reviewed active problem list, medication list, allergies, family history, social history with the patient/caregiver today.   ROS   Constitutional: Negative for fever or weight change.  Respiratory: positive  for cough and shortness of breath.   Cardiovascular: Negative for chest pain or palpitations.  Gastrointestinal: Negative for abdominal pain, no bowel changes.  Musculoskeletal: no joint swelling.  Skin: Negative for rash.  Neurological: positive  for dizziness and headache.  No other specific complaints in a complete review of systems (except as listed in HPI  above).   Objective  Vitals:   10/14/19 1309  BP: 110/70  Pulse: (!) 106  Resp: 16  Temp: 98 F (36.7 C)  TempSrc: Oral  SpO2: 95%  Weight: 172 lb 4.8 oz (78.2 kg)  Height: 5' 9"  (1.753 m)    Body mass index is 25.44 kg/m.  Physical Exam  Constitutional: Patient appears well-developed but looks frail for her age No distress.  HEENT: head atraumatic, normocephalic, pupils equal and reactive to light Cardiovascular: Normal rate, regular rhythm and normal heart sounds.  No murmur heard. No BLE edema. Pulmonary/Chest: Effort normal and breath sounds normal, diffuse wheezing  No respiratory distress. Abdominal: Soft.  There is no tenderness. Psychiatric: Patient has a normal mood and affect. behavior is normal. Judgment and thought content normal.  Recent Results (from the past 2160 hour(s))  Lipid panel     Status: Abnormal   Collection Time: 08/05/19  3:04 PM  Result Value Ref Range   Cholesterol 148 <200 mg/dL   HDL 39 (L) > OR = 50 mg/dL   Triglycerides 193 (H) <150 mg/dL   LDL Cholesterol (Calc) 80 mg/dL (calc)    Comment: Reference range: <100 . Desirable range <100 mg/dL for primary prevention;   <70 mg/dL for patients with CHD or diabetic patients  with > or = 2 CHD risk factors. Marland Kitchen LDL-C is now calculated using the Martin-Hopkins  calculation, which is a validated novel method providing  better accuracy than the Friedewald equation in the  estimation of LDL-C.  Cresenciano Genre et al. Annamaria Helling. 0454;098(11): 2061-2068  (http://education.[facility].com/faq/FAQ164)    Total CHOL/HDL Ratio 3.8 <5.0 (calc)   Non-HDL Cholesterol (Calc) 109 <130 mg/dL (calc)    Comment: For patients with diabetes plus 1 major ASCVD risk  factor, treating to a non-HDL-C goal of <100 mg/dL  (LDL-C of <70 mg/dL) is considered a therapeutic  option.   Microalbumin / creatinine urine ratio     Status: Abnormal   Collection Time: 08/05/19  3:04 PM  Result Value Ref Range   Creatinine,  Urine 34 20 - 275 mg/dL   Microalb, Ur 1.2 mg/dL  Comment: Reference Range Not established    Microalb Creat Ratio 35 (H) <30 mcg/mg creat    Comment: . The ADA defines abnormalities in albumin excretion as follows: Marland Kitchen Category         Result (mcg/mg creatinine) . Normal                    <30 Microalbuminuria         30-299  Clinical albuminuria   > OR = 300 . The ADA recommends that at least two of three specimens collected within a 3-6 month period be abnormal before considering a patient to be within a diagnostic category.   COMPLETE METABOLIC PANEL WITH GFR     Status: Abnormal   Collection Time: 08/05/19  3:04 PM  Result Value Ref Range   Glucose, Bld 266 (H) 65 - 99 mg/dL    Comment: .            Fasting reference interval . For someone without known diabetes, a glucose value >125 mg/dL indicates that they may have diabetes and this should be confirmed with a follow-up test. .    BUN 10 7 - 25 mg/dL   Creat 0.85 0.50 - 1.05 mg/dL    Comment: For patients >59 years of age, the reference limit for Creatinine is approximately 13% higher for people identified as African-American. .    GFR, Est Non African American 76 > OR = 60 mL/min/1.75m   GFR, Est African American 88 > OR = 60 mL/min/1.785m  BUN/Creatinine Ratio NOT APPLICABLE 6 - 22 (calc)   Sodium 136 135 - 146 mmol/L   Potassium 4.0 3.5 - 5.3 mmol/L   Chloride 103 98 - 110 mmol/L   CO2 26 20 - 32 mmol/L   Calcium 8.9 8.6 - 10.4 mg/dL   Total Protein 6.5 6.1 - 8.1 g/dL   Albumin 4.0 3.6 - 5.1 g/dL   Globulin 2.5 1.9 - 3.7 g/dL (calc)   AG Ratio 1.6 1.0 - 2.5 (calc)   Total Bilirubin 0.3 0.2 - 1.2 mg/dL   Alkaline phosphatase (APISO) 98 37 - 153 U/L   AST 7 (L) 10 - 35 U/L   ALT 8 6 - 29 U/L  CBC with Differential/Platelet     Status: None   Collection Time: 08/05/19  3:04 PM  Result Value Ref Range   WBC 8.3 3.8 - 10.8 Thousand/uL   RBC 4.48 3.80 - 5.10 Million/uL   Hemoglobin 14.2 11.7 - 15.5  g/dL   HCT 43.1 35 - 45 %   MCV 96.2 80.0 - 100.0 fL   MCH 31.7 27.0 - 33.0 pg   MCHC 32.9 32.0 - 36.0 g/dL   RDW 11.8 11.0 - 15.0 %   Platelets 206 140 - 400 Thousand/uL   MPV 10.0 7.5 - 12.5 fL   Neutro Abs 6,208 1,500 - 7,800 cells/uL   Lymphs Abs 1,643 850 - 3,900 cells/uL   Absolute Monocytes 398 200 - 950 cells/uL   Eosinophils Absolute 33 15 - 500 cells/uL   Basophils Absolute 17 0 - 200 cells/uL   Neutrophils Relative % 74.8 %   Total Lymphocyte 19.8 %   Monocytes Relative 4.8 %   Eosinophils Relative 0.4 %   Basophils Relative 0.2 %  Hemoglobin A1c     Status: Abnormal   Collection Time: 08/05/19  3:04 PM  Result Value Ref Range   Hgb A1c MFr Bld 6.8 (H) <5.7 % of total Hgb  Comment: For someone without known diabetes, a hemoglobin A1c value of 6.5% or greater indicates that they may have  diabetes and this should be confirmed with a follow-up  test. . For someone with known diabetes, a value <7% indicates  that their diabetes is well controlled and a value  greater than or equal to 7% indicates suboptimal  control. A1c targets should be individualized based on  duration of diabetes, age, comorbid conditions, and  other considerations. . Currently, no consensus exists regarding use of hemoglobin A1c for diagnosis of diabetes for children. .    Mean Plasma Glucose 148 (calc)   eAG (mmol/L) 8.2 (calc)  VITAMIN D 25 Hydroxy (Vit-D Deficiency, Fractures)     Status: Abnormal   Collection Time: 08/05/19  3:04 PM  Result Value Ref Range   Vit D, 25-Hydroxy 17 (L) 30 - 100 ng/mL    Comment: Vitamin D Status         25-OH Vitamin D: . Deficiency:                    <20 ng/mL Insufficiency:             20 - 29 ng/mL Optimal:                 > or = 30 ng/mL . For 25-OH Vitamin D testing on patients on  D2-supplementation and patients for whom quantitation  of D2 and D3 fractions is required, the Quest[facility]ureD(TM) 25-OH VIT D, (D2,D3), LC/MS/MS is recommended:  order  code 782-790-2007 (patients >38yr). See Note 1 . Note 1 . For additional information, please refer to  http://education.[facility].com/faq/FAQ199  (This link is being provided for informational/ educational purposes only.)   Thyroid Panel With TSH     Status: Abnormal   Collection Time: 08/05/19  3:04 PM  Result Value Ref Range   T3 Uptake 28 22 - 35 %   T4, Total 6.7 5.1 - 11.9 mcg/dL   Free Thyroxine Index 1.9 1.4 - 3.8   TSH 0.31 (L) 0.40 - 4.50 mIU/L  HIV Antibody (routine testing w rflx)     Status: None   Collection Time: 08/05/19  3:04 PM  Result Value Ref Range   HIV 1&2 Ab, 4th Generation NON-REACTIVE NON-REACTI    Comment: HIV-1 antigen and HIV-1/HIV-2 antibodies were not detected. There is no laboratory evidence of HIV infection. .Marland KitchenPLEASE NOTE: This information has been disclosed to you from records whose confidentiality may be protected by state law.  If your state requires such protection, then the state law prohibits you from making any further disclosure of the information without the specific written consent of the person to whom it pertains, or as otherwise permitted by law. A general authorization for the release of medical or other information is NOT sufficient for this purpose. . For additional information please refer to http://education.quest[facility].com/faq/FAQ106 (This link is being provided for informational/ educational purposes only.) . .Marland KitchenThe performance of this assay has not been clinically validated in patients less than 29years old. .Marland Kitchen  SARS CORONAVIRUS 2 (TAT 6-24 HRS) Nasopharyngeal Nasopharyngeal Swab     Status: None   Collection Time: 09/19/19 12:59 AM   Specimen: Nasopharyngeal Swab  Result Value Ref Range   SARS Coronavirus 2 NEGATIVE NEGATIVE    Comment: (NOTE) SARS-CoV-2 target nucleic acids are NOT DETECTED.  The SARS-CoV-2 RNA is generally detectable in upper and lower respiratory specimens during the acute phase  of infection. Negative results  do not preclude SARS-CoV-2 infection, do not rule out co-infections with other pathogens, and should not be used as the sole basis for treatment or other patient management decisions. Negative results must be combined with clinical observations, patient history, and epidemiological information. The expected result is Negative.  Fact Sheet for Patients: SugarRoll.be  Fact Sheet for Healthcare Providers: https://www.woods-mathews.com/  This test is not yet approved or cleared by the [place] FDA and  has been authorized for detection and/or diagnosis of SARS-CoV-2 by FDA under an Emergency Use Authorization (EUA). This EUA will remain  in effect (meaning this test can be used) for the duration of the COVID-19 declaration under Se ction 564(b)(1) of the Act, 21 U.S.C. section 360bbb-3(b)(1), unless the authorization is terminated or revoked sooner.  Performed at [facility], [place] [address] [place], [place] [address]     Diabetic Foot Exam: Diabetic Foot Exam - Simple   Simple Foot Form Diabetic Foot exam was performed with the following findings: Yes 10/14/2019  1:32 PM  Visual Inspection See comments: Yes Sensation Testing Intact to touch and monofilament testing bilaterally: Yes Pulse Check Posterior Tibialis and Dorsalis pulse intact bilaterally: Yes Comments Dry skin, thick toenails.       PHQ2/9: Depression screen [facility] 2/9 10/14/2019 08/05/2019 05/04/2019 01/27/2019 01/22/2019  Decreased Interest 3 1 1 1 1   Down, Depressed, Hopeless 3 1 3 3 3   PHQ - 2 Score 6 2 4 4 4   Altered sleeping 2 3 3  0 0  Tired, decreased energy 3 3 3 2 2   Change in appetite 3 3 1 2 2   Feeling bad or failure about yourself  2 1 1 3 3   Trouble concentrating 3 3 2 3 3   Moving slowly or fidgety/restless 0 1 1 1 1   Suicidal thoughts 0 0 0 0 0  PHQ-9 Score 19 16 15 15 15   Difficult doing work/chores - Not difficult  at all Not difficult at all Somewhat difficult Somewhat difficult  Some recent data might be hidden    phq 9 is positive   Fall Risk: Fall Risk  10/14/2019 08/05/2019 05/04/2019 01/27/2019 01/22/2019  Falls in the past year? 1 0 1 1 1   Number falls in past yr: 1 0 1 1 1   Injury with Fall? 1 0 0 0 0  Comment - - - - -  Risk for fall due to : - - - Impaired balance/gait;History of fall(s) History of fall(s)  Risk for fall due to: Comment - - - - -  Follow up - - - Falls prevention discussed Falls evaluation completed    Functional Status Survey: Is the patient deaf or have difficulty hearing?: Yes Does the patient have difficulty seeing, even when wearing glasses/contacts?: Yes Does the patient have difficulty concentrating, remembering, or making decisions?: Yes Does the patient have difficulty walking or climbing stairs?: No Does the patient have difficulty dressing or bathing?: No Does the patient have difficulty doing errands alone such as visiting a doctor's office or shopping?: No    Assessment & Plan  1. Parkinson's disease ([place])  Keep follow up with Dr. Manuella Ghazi   2. GAD (generalized anxiety disorder)  Continue follow up with Dr. [name]   3. MDD (major depressive disorder), recurrent episode, moderate ([place])   4. Needs flu shot  - Flu Vaccine QUAD 36+ mos IM

## 2019-10-15 ENCOUNTER — Telehealth: Payer: Self-pay | Admitting: Licensed Clinical Social Worker

## 2019-10-15 ENCOUNTER — Other Ambulatory Visit: Payer: Self-pay

## 2019-10-15 ENCOUNTER — Ambulatory Visit: Payer: Medicare Other | Admitting: Licensed Clinical Social Worker

## 2019-10-15 NOTE — Telephone Encounter (Signed)
Therapist sent link invites via email and text for today's virtual assessment. Pt responded while in Urgent care and was not able to meet privately. Pt rescheduled for tomorrow 10/16/19.

## 2019-10-16 ENCOUNTER — Ambulatory Visit (INDEPENDENT_AMBULATORY_CARE_PROVIDER_SITE_OTHER): Payer: Medicare Other | Admitting: Licensed Clinical Social Worker

## 2019-10-16 ENCOUNTER — Encounter: Payer: Self-pay | Admitting: Licensed Clinical Social Worker

## 2019-10-16 DIAGNOSIS — F331 Major depressive disorder, recurrent, moderate: Secondary | ICD-10-CM | POA: Diagnosis not present

## 2019-10-16 DIAGNOSIS — F411 Generalized anxiety disorder: Secondary | ICD-10-CM
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
## 2019-10-19 ENCOUNTER — Encounter: Payer: Self-pay | Admitting: Family Medicine

## 2019-10-21 ENCOUNTER — Telehealth: Payer: Self-pay

## 2019-10-21 NOTE — Telephone Encounter (Signed)
Returned call to patient, attempted both numbers.  Left voicemail for patient to call writer back.

## 2019-10-21 NOTE — Telephone Encounter (Signed)
pt states she needs something for anxiety.

## 2019-10-22 ENCOUNTER — Telehealth: Payer: Self-pay

## 2019-10-22 NOTE — Telephone Encounter (Signed)
Attempted to call patient, no response

## 2019-10-22 NOTE — Telephone Encounter (Signed)
pt called left message that she needed something for anxiety.

## 2019-10-23 ENCOUNTER — Telehealth: Payer: Self-pay | Admitting: Psychiatry

## 2019-10-23 NOTE — Telephone Encounter (Signed)
Attempted to contact patient once more since it is a long holiday weekend, to discuss medication changes for her increased anxiety.  However whenever writer attempted to call her she is unavailable.  Unable to leave message today since voicemail not set up.

## 2019-10-29 ENCOUNTER — Other Ambulatory Visit: Payer: Self-pay

## 2019-10-29 ENCOUNTER — Ambulatory Visit: Payer: Medicare Other | Admitting: Licensed Clinical Social Worker

## 2019-11-16 ENCOUNTER — Other Ambulatory Visit: Payer: Medicare Other

## 2019-11-16 DIAGNOSIS — Z20822 Contact with and (suspected) exposure to covid-19: Secondary | ICD-10-CM | POA: Diagnosis not present

## 2019-11-18 ENCOUNTER — Telehealth (INDEPENDENT_AMBULATORY_CARE_PROVIDER_SITE_OTHER): Payer: Medicare Other | Admitting: Psychiatry

## 2019-11-18 ENCOUNTER — Encounter: Payer: Self-pay | Admitting: Psychiatry

## 2019-11-18 ENCOUNTER — Other Ambulatory Visit: Payer: Self-pay | Admitting: Family Medicine

## 2019-11-18 ENCOUNTER — Other Ambulatory Visit: Payer: Self-pay

## 2019-11-18 ENCOUNTER — Telehealth: Payer: Self-pay | Admitting: *Deleted

## 2019-11-18 DIAGNOSIS — Z87898 Personal history of other specified conditions: Secondary | ICD-10-CM | POA: Insufficient documentation

## 2019-11-18 DIAGNOSIS — F17200 Nicotine dependence, unspecified, uncomplicated: Secondary | ICD-10-CM

## 2019-11-18 DIAGNOSIS — F411 Generalized anxiety disorder: Secondary | ICD-10-CM | POA: Diagnosis not present

## 2019-11-18 DIAGNOSIS — F331 Major depressive disorder, recurrent, moderate: Secondary | ICD-10-CM | POA: Diagnosis not present

## 2019-11-18 DIAGNOSIS — G4700 Insomnia, unspecified: Secondary | ICD-10-CM

## 2019-11-18 LAB — NOVEL CORONAVIRUS, NAA

## 2019-11-18 MED ORDER — HYDROXYZINE PAMOATE 50 MG PO CAPS: 50.0000 mg | ORAL_CAPSULE | Freq: Two times a day (BID) | ORAL | 1 refills | Status: DC | PRN

## 2019-11-18 MED ORDER — FLUOXETINE HCL 20 MG PO CAPS: 20.0000 mg | ORAL_CAPSULE | Freq: Every day | ORAL | 1 refills | Status: DC

## 2019-11-18 MED ORDER — VENLAFAXINE HCL ER 75 MG PO CP24: 75.0000 mg | ORAL_CAPSULE | Freq: Every day | ORAL | 1 refills | Status: DC

## 2019-11-18 NOTE — Telephone Encounter (Signed)
Reviewed with the patient her Covid test results was not sufficient for a results. She understood and we scheduled her for another test tomorrow afternoon at [facility].

## 2019-11-18 NOTE — Patient Instructions (Signed)
Fluoxetine capsules or tablets (Depression/Mood Disorders) What is this medicine? FLUOXETINE (floo OX e teen) belongs to a class of drugs known as selective serotonin reuptake inhibitors (SSRIs). It helps to treat mood problems such as depression, obsessive compulsive disorder, and panic attacks. It can also treat certain eating disorders. This medicine may be used for other purposes; ask your health care provider or pharmacist if you have questions. COMMON BRAND NAME(S): Prozac What should I tell my health care provider before I take this medicine? They need to know if you have any of these conditions:  bipolar disorder or a family history of bipolar disorder  bleeding disorders  glaucoma  heart disease  liver disease  low levels of sodium in the blood  seizures  suicidal thoughts, plans, or attempt; a previous suicide attempt by you or a family member  take MAOIs like Carbex, Eldepryl, Marplan, Nardil, and Parnate  take medicines that treat or prevent blood clots  thyroid disease  an unusual or allergic reaction to fluoxetine, other medicines, foods, dyes, or preservatives  pregnant or trying to get pregnant  breast-feeding How should I use this medicine? Take this medicine by mouth with a glass of water. Follow the directions on the prescription label. You can take this medicine with or without food. Take your medicine at regular intervals. Do not take it more often than directed. Do not stop taking this medicine suddenly except upon the advice of your doctor. Stopping this medicine too quickly may cause serious side effects or your condition may worsen. A special MedGuide will be given to you by the pharmacist with each prescription and refill. Be sure to read this information carefully each time. Talk to your pediatrician regarding the use of this medicine in children. While this drug may be prescribed for children as young as 7 years for selected conditions, precautions do  apply. Overdosage: If you think you have taken too much of this medicine contact a poison control center or emergency room at once. NOTE: This medicine is only for you. Do not share this medicine with others. What if I miss a dose? If you miss a dose, skip the missed dose and go back to your regular dosing schedule. Do not take double or extra doses. What may interact with this medicine? Do not take this medicine with any of the following medications:  other medicines containing fluoxetine, like Sarafem or Symbyax  cisapride  dronedarone  linezolid  MAOIs like Carbex, Eldepryl, Marplan, Nardil, and Parnate  methylene blue (injected into a vein)  pimozide  thioridazine This medicine may also interact with the following medications:  alcohol  amphetamines  aspirin and aspirin-like medicines  carbamazepine  certain medicines for depression, anxiety, or psychotic disturbances  certain medicines for migraine headaches like almotriptan, eletriptan, frovatriptan, naratriptan, rizatriptan, sumatriptan, zolmitriptan  digoxin  diuretics  fentanyl  flecainide  furazolidone  isoniazid  lithium  medicines for sleep  medicines that treat or prevent blood clots like warfarin, enoxaparin, and dalteparin  NSAIDs, medicines for pain and inflammation, like ibuprofen or naproxen  other medicines that prolong the QT interval (an abnormal heart rhythm)  phenytoin  procarbazine  propafenone  rasagiline  ritonavir  supplements like St. John's wort, kava kava, valerian  tramadol  tryptophan  vinblastine This list may not describe all possible interactions. Give your health care provider a list of all the medicines, herbs, non-prescription drugs, or dietary supplements you use. Also tell them if you smoke, drink alcohol, or use illegal drugs.  Some items may interact with your medicine. What should I watch for while using this medicine? Tell your doctor if your  symptoms do not get better or if they get worse. Visit your doctor or health care professional for regular checks on your progress. Because it may take several weeks to see the full effects of this medicine, it is important to continue your treatment as prescribed by your doctor. Patients and their families should watch out for new or worsening thoughts of suicide or depression. Also watch out for sudden changes in feelings such as feeling anxious, agitated, panicky, irritable, hostile, aggressive, impulsive, severely restless, overly excited and hyperactive, or not being able to sleep. If this happens, especially at the beginning of treatment or after a change in dose, call your health care professional. You may get drowsy or dizzy. Do not drive, use machinery, or do anything that needs mental alertness until you know how this medicine affects you. Do not stand or sit up quickly, especially if you are an older patient. This reduces the risk of dizzy or fainting spells. Alcohol may interfere with the effect of this medicine. Avoid alcoholic drinks. Your mouth may get dry. Chewing sugarless gum or sucking hard candy, and drinking plenty of water may help. Contact your doctor if the problem does not go away or is severe. This medicine may affect blood sugar levels. If you have diabetes, check with your doctor or health care professional before you change your diet or the dose of your diabetic medicine. What side effects may I notice from receiving this medicine? Side effects that you should report to your doctor or health care professional as soon as possible:  allergic reactions like skin rash, itching or hives, swelling of the face, lips, or tongue  anxious  black, tarry stools  breathing problems  changes in vision  confusion  elevated mood, decreased need for sleep, racing thoughts, impulsive behavior  eye pain  fast, irregular heartbeat  feeling faint or lightheaded, falls  feeling  agitated, angry, or irritable  hallucination, loss of contact with reality  loss of balance or coordination  loss of memory  painful or prolonged erections  restlessness, pacing, inability to keep still  seizures  stiff muscles  suicidal thoughts or other mood changes  trouble sleeping  unusual bleeding or bruising  unusually weak or tired  vomiting Side effects that usually do not require medical attention (report to your doctor or health care professional if they continue or are bothersome):  change in appetite or weight  change in sex drive or performance  diarrhea  dry mouth  headache  increased sweating  nausea  tremors This list may not describe all possible side effects. Call your doctor for medical advice about side effects. You may report side effects to FDA at 1-800-FDA-1088. Where should I keep my medicine? Keep out of the reach of children. Store at room temperature between 15 and 30 degrees C (59 and 86 degrees F). Throw away any unused medicine after the expiration date. NOTE: This sheet is a summary. It may not cover all possible information. If you have questions about this medicine, talk to your doctor, pharmacist, or health care provider.  2020 Elsevier/Gold Standard (2017-09-26 11:56:53)  

## 2019-11-18 NOTE — Progress Notes (Signed)
Provider Location : [facility] Patient Location : Home  Participants: Patient , Provider   Virtual Visit via Telephone Note  I connected with Tricia [name] on 11/18/19 at  3:00 PM EDT by telephone and verified that I am speaking with the correct person using two identifiers.   I discussed the limitations, risks, security and privacy concerns of performing an evaluation and management service by telephone and the availability of in person appointments. I also discussed with the patient that there may be a patient responsible charge related to this service. The patient expressed understanding and agreed to proceed.    I discussed the assessment and treatment plan with the patient. The patient was provided an opportunity to ask questions and all were answered. The patient agreed with the plan and demonstrated an understanding of the instructions.   The patient was advised to call back or seek an in-person evaluation if the symptoms worsen or if the condition fails to improve as anticipated.      [place] MD OP Progress Note  11/18/2019 6:17 PM Tricia [name]  MRN:  [id number]  Chief Complaint:  Chief Complaint    Follow-up     HPI: Tricia [name] is a 59 year old Caucasian female on disability, divorced, lives in [place], has a history of depression, anxiety, carpal tunnel syndrome, tobacco use disorder, atypical Parkinson's disease diagnosis, polyneuropathy, cervical dystonia, migraine headaches, COPD, diastolic dysfunction, diabetes melitis, asthma, degenerative disc disease was evaluated by phone today.  Patient reports she is currently struggling with a lot of stressors.  She reports that her best friend and mom got diagnosed with COVID-19 infection.  She reports she is relieved that she does not have it and also her boyfriend tested negative.  She however continues to struggle with her other health issues including her recent diagnosis of atypical Parkinson's disease.   She is currently on carbidopa levodopa and reports she is tolerating it well so far.  She has been on it since the past 3 days.  She reports she continues to struggle with anxiety on a regular basis.  She is nervous, on edge often.  She wonders whether she can be put back on her Klonopin which she took a long time ago.  She does not believe the current BuSpar or the venlafaxine at this dosage is beneficial.  She denies any sleep issues.  She denies any suicidality, homicidality or perceptual disturbances.  She has been noncompliant with psychotherapy sessions however reports she is willing to restart therapy.  Patient denies any other concerns today.    Visit Diagnosis:    ICD-10-CM   1. GAD (generalized anxiety disorder)  F41.1 venlafaxine XR (EFFEXOR XR) 75 MG 24 hr capsule    FLUoxetine (PROZAC) 20 MG capsule    hydrOXYzine (VISTARIL) 50 MG capsule  2. MDD (major depressive disorder), recurrent episode, moderate (HCC)  F33.1 venlafaxine XR (EFFEXOR XR) 75 MG 24 hr capsule    FLUoxetine (PROZAC) 20 MG capsule  3. Tobacco use disorder  F17.200   4. History of prolonged Q-T interval on ECG  Z87.898     Past Psychiatric History: I have reviewed past psychiatric history from my progress note on 10/13/2019.  Past trials of Cymbalta, sertraline, Lexapro, Xanax  Past Medical History:  Past Medical History:  Diagnosis Date  . Anxiety   . Arthritis    joints and hands/ knees  . Asthma    uses inhaler  . Benign essential tremor    head  .  Cervical dystonia    neck pain  . Cholesteatoma of left ear    x2  . COPD (chronic obstructive pulmonary disease) ([place])   . Cough   . Depression   . Diabetes mellitus without complication ([place])    type 2  . Diastolic dysfunction   . Dyspnea   . Dysrhythmia    diastolic dysfunction  . GERD (gastroesophageal reflux disease)   . Headache    migraines/ one per week  . 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 Hyperlipidemia   .  Hypertension   . Motion sickness    boat  . Neuromuscular disorder (HCC)    neuropathy feet and hands( nerve damage)  . Wears dentures    upper and lower    Past Surgical History:  Procedure Laterality Date  . CARPAL TUNNEL RELEASE Bilateral    x2 right, 1x on left  . COLONOSCOPY    . COLONOSCOPY WITH PROPOFOL N/A 04/25/2017   Procedure: COLONOSCOPY WITH PROPOFOL;  Surgeon: [name], MD;  Location: [facility];  Service: Endoscopy;  Laterality: N/A;  diabetic-oral med  . DILATION AND CURETTAGE OF UTERUS    . EXTERNAL EAR SURGERY Left    x2  . POLYPECTOMY  04/25/2017   Procedure: POLYPECTOMY INTESTINAL;  Surgeon: [name], MD;  Location: [facility];  Service: Endoscopy;;  . SPINE SURGERY     herniated disc  . TUBAL LIGATION      Family Psychiatric History: I have reviewed family psychiatric history from my progress note on 10/13/2019  Family History:  Family History  Problem Relation Age of Onset  . Emphysema Mother   . Anxiety disorder Mother   . Stroke Father   . Throat cancer Father   . Multiple sclerosis Daughter   . Bipolar disorder Daughter   . Cervical cancer Daughter   . Bipolar disorder Daughter   . Drug abuse Daughter   . Lung cancer Maternal Aunt   . Lung cancer Maternal Uncle   . Lung cancer Maternal Grandmother   . Lung cancer Maternal Grandfather     Social History: I have reviewed social history from my progress note on 10/13/2019 Social History   Socioeconomic History  . Marital status: Divorced    Spouse name: Not on file  . Number of children: 2  . Years of education: Not on file  . Highest education level: Associate degree: academic program  Occupational History  . Occupation: Disability  Tobacco Use  . Smoking status: Current Every Day Smoker    Packs/day: 1.00    Years: 41.00    Pack years: 41.00    Types: Cigarettes    Start date: 05/19/1977  . Smokeless tobacco: Never Used  Vaping Use  . Vaping Use: Former   Substance and Sexual Activity  . Alcohol use: No    Alcohol/week: 0.0 standard drinks  . Drug use: No  . Sexual activity: Not Currently  Other Topics Concern  . Not on file  Social History Narrative   Lives with her boyfriend, she has two daughters.    Social Determinants of Health   Financial Resource Strain:   . Difficulty of Paying Living Expenses: Not on file  Food Insecurity:   . Worried About Charity fundraiser in the Last Year: Not on file  . Ran Out of Food in the Last Year: Not on file  Transportation Needs:   . Lack of Transportation (Medical): Not on file  .  Lack of Transportation (Non-Medical): Not on file  Physical Activity:   . Days of Exercise per Week: Not on file  . Minutes of Exercise per Session: Not on file  Stress:   . Feeling of Stress : Not on file  Social Connections: Unknown  . Frequency of Communication with Friends and Family: Not on file  . Frequency of Social Gatherings with Friends and Family: Not on file  . Attends Religious Services: Not on file  . Active Member of Clubs or Organizations: Not on file  . Attends Archivist Meetings: Not on file  . Marital Status: Living with partner    Allergies:  Allergies  Allergen Reactions  . Augmentin [Amoxicillin-Pot Clavulanate] Diarrhea  . Penicillins Itching    Metabolic Disorder Labs: Lab Results  Component Value Date   HGBA1C 6.8 (H) 08/05/2019   MPG 148 08/05/2019   MPG 146 08/27/2018   No results found for: PROLACTIN Lab Results  Component Value Date   CHOL 148 08/05/2019   TRIG 193 (H) 08/05/2019   HDL 39 (L) 08/05/2019   CHOLHDL 3.8 08/05/2019   VLDL 34 10/09/2012   LDLCALC 80 08/05/2019   LDLCALC 50 10/29/2017   Lab Results  Component Value Date   TSH 0.31 (L) 08/05/2019   TSH 1.380 10/04/2014    Therapeutic Level Labs: No results found for: LITHIUM No results found for: VALPROATE No components found for:  CBMZ  Current Medications: Current Outpatient  Medications  Medication Sig Dispense Refill  . carbidopa-levodopa (SINEMET IR) 25-100 MG tablet Take by mouth.    . Ubrogepant (UBRELVY) 100 MG TABS Take by mouth.    Marland Kitchen albuterol (VENTOLIN HFA) 108 (90 Base) MCG/ACT inhaler INHALE 1 PUFF BY MOUTH AS NEEDED 18 g 1  . blood glucose meter kit and supplies KIT Dispense accuchek aviva plus; can change if needed e11.9 1 each 0  . budesonide-formoterol (SYMBICORT) 160-4.5 MCG/ACT inhaler USE 2 INHALATIONS INTO THE  LUNGS 2 TIMES DAILY    . busPIRone (BUSPAR) 7.5 MG tablet Take 7.5 mg by mouth 2 (two) times daily.     . carbidopa-levodopa (SINEMET IR) 25-100 MG tablet Take 1 tablet by mouth 3 (three) times daily.    . cetirizine (ZYRTEC) 10 MG tablet Take 10 mg by mouth as needed for allergies.     Marland Kitchen colchicine (COLCRYS) 0.6 MG tablet Take 1 tablet (0.6 mg total) by mouth daily. 30 tablet 0  . cyclobenzaprine (FLEXERIL) 10 MG tablet Take 10 mg by mouth 3 (three) times daily.     Marland Kitchen EMGALITY 120 MG/ML SOAJ Inject 1 mL into the skin every 30 (thirty) days.    Marland Kitchen FLUoxetine (PROZAC) 20 MG capsule Take 1 capsule (20 mg total) by mouth daily. For anxiety and depression 30 capsule 1  . Fremanezumab-vfrm 225 MG/1.5ML SOAJ Inject into the skin.     . furosemide (LASIX) 40 MG tablet Take 1 tablet (40 mg total) by mouth daily as needed. 30 tablet 2  . gabapentin (NEURONTIN) 600 MG tablet Take 1 tablet (600 mg total) by mouth 3 (three) times daily. 90 tablet 3  . glucose blood test strip Use as directed to check blood glucose daily 100 each 2  . HUMALOG KWIKPEN 100 UNIT/ML KwikPen INJECT SUBCUTANEOUSLY 5 TO  10 UNITS 3 TIMES DAILY 30 mL 2  . hydroCHLOROthiazide (MICROZIDE PO) hydrochlorothiazide    . hydrOXYzine (VISTARIL) 50 MG capsule Take 1 capsule (50 mg total) by mouth 2 (two) times daily as  needed. For severe anxiety symptoms 60 capsule 1  . ibuprofen (ADVIL) 600 MG tablet Take by mouth.     . indomethacin (INDOCIN) 50 MG capsule indomethacin 50 mg capsule     . insulin glargine (LANTUS SOLOSTAR) 100 UNIT/ML Solostar Pen Inject 20 Units into the skin daily. 15 pen 1  . Insulin Pen Needle 32G X 6 MM MISC 2 each by Does not apply route daily. 200 each 2  . ipratropium-albuterol (DUONEB) 0.5-2.5 (3) MG/3ML SOLN Inhale 3 mLs into the lungs every 6 (six) hours as needed. 360 mL 3  . metFORMIN (GLUCOPHAGE-XR) 750 MG 24 hr tablet Take 2 tablets (1,500 mg total) by mouth daily with breakfast. 180 tablet 1  . montelukast (SINGULAIR) 10 MG tablet TAKE 1 TABLET BY MOUTH  DAILY IN THE AFTERNOON 90 tablet 2  . morphine (MS CONTIN) 30 MG 12 hr tablet morphine ER 30 mg tablet,extended release    . naproxen (NAPROSYN) 500 MG tablet Take 500 mg by mouth 2 (two) times daily.     Marland Kitchen omeprazole (PRILOSEC) 40 MG capsule Take 1 capsule (40 mg total) by mouth daily. 90 capsule 1  . Oxycodone HCl 10 MG TABS oxycodone 10 mg tablet (Patient not taking: Reported on 10/14/2019)    . Oxycodone HCl 10 MG TABS Take 10 mg by mouth 4 (four) times daily as needed.     . promethazine (PHENERGAN) 25 MG tablet     . RELISTOR 150 MG TABS     . rosuvastatin (CRESTOR) 5 MG tablet Take 1 tablet (5 mg total) by mouth at bedtime. 90 tablet 1  . theophylline (UNIPHYL) 400 MG 24 hr tablet Take 1 tablet by mouth daily.     Marland Kitchen tiotropium (SPIRIVA HANDIHALER) 18 MCG inhalation capsule Place 1 capsule into inhaler and inhale daily. pm    . topiramate (TOPAMAX) 100 MG tablet Take 1 tablet by mouth 2 (two) times daily.     . traZODone (DESYREL) 50 MG tablet TAKE 1 TABLET BY MOUTH AT  BEDTIME 90 tablet 1  . venlafaxine XR (EFFEXOR XR) 75 MG 24 hr capsule Take 1 capsule (75 mg total) by mouth daily with breakfast. 30 capsule 1   No current facility-administered medications for this visit.     Musculoskeletal: Strength & Muscle Tone: UTA Gait & Station: UTA Patient leans: N/A  Psychiatric Specialty Exam: Review of Systems  Neurological: Positive for tremors.  Psychiatric/Behavioral: Positive for  dysphoric mood. The patient is nervous/anxious.   All other systems reviewed and are negative.   There were no vitals taken for this visit.There is no height or weight on file to calculate BMI.  General Appearance: UTA  Eye Contact:  UTA  Speech:  Clear and Coherent  Volume:  Normal  Mood:  Anxious and Depressed  Affect:  UTA  Thought Process:  Goal Directed and Descriptions of Associations: Intact  Orientation:  Full (Time, Place, and Person)  Thought Content: Logical   Suicidal Thoughts:  No  Homicidal Thoughts:  No  Memory:  Immediate;   Fair Recent;   Fair Remote;   Fair  Judgement:  Fair  Insight:  Fair  Psychomotor Activity:  Tremor right sided - better - per report  Concentration:  Concentration: Fair and Attention Span: Fair  Recall:  [employer] of Knowledge: Fair  Language: Fair  Akathisia:  No  Handed:  Right  AIMS (if indicated): UTA  Assets:  Communication Skills Desire for Improvement Housing  ADL's:  Intact  Cognition: WNL  Sleep:  Fair   Screenings: GAD-7     [facility] Visit from 03/19/2018 in [facility] Office Visit from 01/30/2018 in [facility] Office Visit from 10/29/2017 in [facility]  Total GAD-7 Score _0 PHQ2-9     Office Visit from 10/14/2019 in [facility] Office Visit from 08/05/2019 in [facility] Office Visit from 05/04/2019 in [facility] from 01/27/2019 in [facility] Office Visit from 01/22/2019 in [facility]  PHQ-2 Total Score _1 PHQ-9 Total Score _2 Assessment and Plan: Tricia [name] is a 59 year old Caucasian female on disability, lives in [place], has a history of depression, anxiety, degenerative disc disease, diastolic dysfunction, diabetes melitis, migraine headaches as well as atypical Parkinson's disease was evaluated  by phone today.  She is biologically predisposed given her history of trauma, multiple medical problems.  Patient with psychosocial stressors of recent health problems, diagnosis of atypical Parkinson's disease as well as boyfriend's health issues.  Patient will benefit from medication readjustment since she continues to struggle with anxiety symptoms.  Plan as noted below.  Plan MDD-unstable Taper off venlafaxine.  Will reduce venlafaxine to 75 mg p.o. daily Add Prozac 20 mg p.o. daily with breakfast BuSpar 7.5 mg p.o. twice daily Refer for CBT  GAD-unstable Start Prozac 20 mg p.o. daily with breakfast BuSpar 7.5 mg p.o. twice daily Increase hydroxyzine to 50 mg p.o. twice daily We will avoid benzodiazepines in this patient due to the fact that she is on morphine and oxycodone, discussed drug to drug interaction. Patient to continue CBT.  Patient advised to schedule an appointment with her therapist.  History of prolonged QT syndrome I have reviewed EKG dated 10/14/2019-normal ECG-sinus rhythm, QT C-415.  Follow-up in clinic in 4 weeks or sooner if needed.  I have spent atleast 20 minutes non face to face with patient today. More than 50 % of the time was spent for preparing to see the patient ( e.g., review of test, records ), ordering medications and test ,psychoeducation and supportive psychotherapy and care coordination,as well as documenting clinical information in electronic health record. This note was generated in part or whole with voice recognition software. Voice recognition is usually quite accurate but there are transcription errors that can and very often do occur. I apologize for any typographical errors that were not detected and corrected.     Ursula Alert, MD 11/18/2019, 6:17 PM

## 2019-11-19 ENCOUNTER — Other Ambulatory Visit: Payer: Medicare Other

## 2019-11-19 DIAGNOSIS — Z20822 Contact with and (suspected) exposure to covid-19: Secondary | ICD-10-CM | POA: Diagnosis not present

## 2019-11-20 ENCOUNTER — Other Ambulatory Visit: Payer: Self-pay | Admitting: Family Medicine

## 2019-11-20 DIAGNOSIS — G4700 Insomnia, unspecified: Secondary | ICD-10-CM

## 2019-11-20 LAB — SARS-COV-2, NAA 2 DAY TAT

## 2019-11-20 LAB — NOVEL CORONAVIRUS, NAA: SARS-CoV-2, NAA: NOT DETECTED

## 2019-11-21 NOTE — Telephone Encounter (Signed)
Requested Prescriptions  Pending Prescriptions Disp Refills   traZODone (DESYREL) 50 MG tablet [Pharmacy Med Name: traZODone HCl 50 MG Oral Tablet] 90 tablet 0    Sig: TAKE 1 TABLET BY MOUTH AT  BEDTIME     Psychiatry: Antidepressants - Serotonin Modulator Passed - 11/20/2019 10:08 PM      Passed - Completed PHQ-2 or PHQ-9 in the last 360 days.      Passed - Valid encounter within last 6 months    Recent Outpatient Visits          1 month ago Parkinson's disease [facility])   [facility] [name], MD   3 months ago Atherosclerosis of aorta [facility])   [facility] [name], MD   6 months ago [facility] Lebron Conners D, MD   10 months ago Moderate persistent asthma with acute exacerbation   [place], [name]   1 year ago Diabetes mellitus type 2, insulin dependent [facility])   [facility] [name], MD      Future Appointments            In 2 weeks [name], MD [facility], [place]   In 2 months  [facility], [facility]            Venlafaxine HCl 225 MG TB24 [Pharmacy Med Name: VENLAFAXINE ER TAB 225MG ] 90 tablet     Sig: TAKE 1 TABLET BY MOUTH  DAILY     Psychiatry: Antidepressants - SNRI - desvenlafaxine & venlafaxine Failed - 11/20/2019 10:08 PM      Failed - Triglycerides in normal range and within 360 days    Triglycerides  Date Value Ref Range Status  08/05/2019 193 (H) <150 mg/dL Final  10/09/2012 172 0 - 200 mg/dL Final         Passed - LDL in normal range and within 360 days    Ldl Cholesterol, Calc  Date Value Ref Range Status  10/09/2012 85 0 - 100 mg/dL Final   LDL Cholesterol (Calc)  Date Value Ref Range Status  08/05/2019 80 mg/dL (calc) Final    Comment:    Reference range: <100 . Desirable range <100 mg/dL for primary prevention;   <70 mg/dL for patients with CHD or  diabetic patients  with > or = 2 CHD risk factors. Marland Kitchen LDL-C is now calculated using the Martin-Hopkins  calculation, which is a validated novel method providing  better accuracy than the Friedewald equation in the  estimation of LDL-C.  Cresenciano Genre et al. Annamaria Helling. 3419;379(02): 2061-2068  (http://education.[employer].com/faq/FAQ164)          Passed - Total Cholesterol in normal range and within 360 days    Cholesterol, Total  Date Value Ref Range Status  07/06/2015 163 100 - 199 mg/dL Final   Cholesterol  Date Value Ref Range Status  08/05/2019 148 <200 mg/dL Final  10/09/2012 144 0 - 200 mg/dL Final         Passed - Completed PHQ-2 or PHQ-9 in the last 360 days.      Passed - Last BP in normal range    BP Readings from Last 1 Encounters:  10/14/19 110/70         Passed - Valid encounter within last 6 months    Recent Outpatient Visits          1 month ago Parkinson's disease ([place])   [place]  [facility] [name], MD   3 months ago Atherosclerosis of aorta [facility])   [facility] [name], MD   6 months ago [facility] Lebron Conners D, MD   10 months ago Moderate persistent asthma with acute exacerbation   [place], [name]   1 year ago Diabetes mellitus type 2, insulin dependent [facility])   [facility] [name], MD      Future Appointments            In 2 weeks [name], MD [facility], [place]   In 2 months  [facility], [facility]

## 2019-11-25 ENCOUNTER — Other Ambulatory Visit: Payer: Self-pay

## 2019-11-25 ENCOUNTER — Encounter: Payer: Self-pay | Admitting: Licensed Clinical Social Worker

## 2019-11-25 ENCOUNTER — Ambulatory Visit (INDEPENDENT_AMBULATORY_CARE_PROVIDER_SITE_OTHER): Payer: Medicare Other | Admitting: Licensed Clinical Social Worker

## 2019-11-25 DIAGNOSIS — F411 Generalized anxiety disorder: Secondary | ICD-10-CM | POA: Diagnosis not present

## 2019-11-25 DIAGNOSIS — F331 Major depressive disorder, recurrent, moderate: Secondary | ICD-10-CM | POA: Diagnosis not present

## 2019-11-25 NOTE — Progress Notes (Signed)
Patient Location: Home  Provider Location: Home Office   Virtual Visit via Telephone Note  I connected with Tricia [name] on 11/25/19 at  3:00 PM EDT by telephone and verified that I am speaking with the correct person using two identifiers.   I discussed the limitations, risks, security and privacy concerns of performing an evaluation and management service by telephone and the availability of in person appointments. I also discussed with the patient that there may be a patient responsible charge related to this service. The patient expressed understanding and agreed to proceed.  THERAPY PROGRESS NOTE  Session Time: 30 Minutes  Participation Level: Active  Behavioral Response: AlertAnxious  Type of Therapy: Individual Therapy  Treatment Goals addressed: Anger, Anxiety and Coping  Interventions: CBT  Summary: Tricia [name] is a 59 y.o. female who presents with depression and anxiety sxs. Pt reported "I have been trying to verify my triggers with anxiety" and read through an online forum on what other people with anxiety have shared to help with this. Pt reported main stressors are around relationships with her adult children. Pt reported difficulty identifying the underlying source of anxiety and anger issues, yet was able to tie it back to experiences from childhood. Pt identified ways she copes with anxiety and anger mainly by avoiding confrontation and isolating herself.   Suicidal/Homicidal: No  Therapist Response: Therapist met with patient for first session since completing CCA. Therapist and patient reviewed treatment plan and goals. Pt in agreement. Therapist and patient explored current stressors and coping skills. Therapist provided psychoeducation using anger iceberg analogy. Pt was receptive  Plan: Return again in 2 weeks.  Diagnosis: Axis I: Generalized Anxiety Disorder and MDD, Recurrent, Moderate    Axis II: N/A   P O'Reilly, LCSW,  LCAS 11/25/2019  

## 2019-12-01 DIAGNOSIS — F17210 Nicotine dependence, cigarettes, uncomplicated: Secondary | ICD-10-CM | POA: Diagnosis not present

## 2019-12-01 DIAGNOSIS — R0600 Dyspnea, unspecified: Secondary | ICD-10-CM | POA: Diagnosis not present

## 2019-12-01 DIAGNOSIS — J449 Chronic obstructive pulmonary disease, unspecified: Secondary | ICD-10-CM | POA: Diagnosis not present

## 2019-12-01 DIAGNOSIS — J209 Acute bronchitis, unspecified: Secondary | ICD-10-CM | POA: Diagnosis not present

## 2019-12-04 ENCOUNTER — Other Ambulatory Visit: Payer: Self-pay | Admitting: Family Medicine

## 2019-12-04 DIAGNOSIS — G4700 Insomnia, unspecified: Secondary | ICD-10-CM

## 2019-12-04 NOTE — Progress Notes (Signed)
Name: Tricia [name]   MRN: [id number]    DOB: 02-14-1961   Date:12/07/2019       Progress Note  Subjective  Chief Complaint  Follow up   HPI   GAD/MDD : under the care of Dr. [name], she is compliant with medication, she states she has good days and bad days.Phq 9 is high, she states her anxiety level is high and missed taking Alprazolam.   Parkinson's: she was diagnosed in 2021 by Dr. Trena Platt PA  with right hemibody parkinson's . She is taking Sinemet IR and has helped tremors slightly    Diabetesinsulin requiring: she states she has been taking medication, missed appointment with endocrinologist because of transportation problems. A1C has been at goal .  She has dyslipidemia, and microalbuminuria.  She denies polyphagia, polydipsia or polyuria. She just took prednisone given by Dr. Raul Del last week, she states glucose is even with prednisone has been 150-160. She states taking Metformin daily but uses insulin prn now   Chronic pain: sees pain clinic, used to see  Dr Sanjuan Dame and she was getting  BZD and promethazine on top of pain medications, but since he retired so she is seeing integrative pain medications, they stopped BZD and is now on buspar and hydroxizine for anxiety but states does not help as much , currently seeing Dr. [name]   Asthma and COPD and chronic airflow limitation: under the care of Dr. Raul Del.She had CT chest done, seen by pulmonologist and referred to Endo by Dr. Raul Del for evaluation of thyroid nodule, appointment next week. She is not using oxygen. She is taking Spiriva , Symbicort and prn albuterol. She had daily cough , wheezing and SOB b, she was seen last week by Dr. Raul Del and given doxycycline and prednisone for flare, she is feeling better today, still has a productive cough but not as bad, she is up to date with COVID vaccine.   CT chest from 03/17/2019  1. Insert lung rads 2 2. Bronchial wall thickening with fluid/debris in segmental  and subsegmental airways to the right lower lobe. This may reflect infectious/inflammatory etiology but in the appropriate clinical setting, aspiration could have this appearance. 3. 1.8 cm right thyroid nodule. Recommend thyroid US.(Ref: J Am Coll Radiol. 2015 Feb;12(2): 143-50). 4. Emphysema (ICD10-J43.9) and Aortic Atherosclerosis (ICD10-170.0)   Thyroid nodule: had US done by Dr. Manfred Shirts at [facility] clinic 06/2019, she states they decided not to do a biopsy   Impression:  Right mid cystic thyroid nodule measuring 1.8 cm  Bilateral enlarged lymph nodes, several of which have a more rounded,  atypical shape (see above)    Major Depression: chronic and recurrent, phq 9 is high. . She lives with boyfriend and he has DM and also some other medical problems, she also worries about her daughter's the youngest one has multiple medical problems recently diagnosed with colon cancer and has lynch syndrome ,also has a daughter in [place] that is a drug addict. She is on disability , struggling financially, cannot afford seeing psychiatrist of therapist Taking effexor, she states mood depends on her current situation, she is seeing Dr. [name] and has a therapist and thinks it may be helping some.   CHF: doing well at this time, she has orthopnea - she uses two pillows. She used to see Dr. Arther Abbott is currently seeing  Dr. Ubaldo Glassing and is on daily lasix now   Echo from 06/05/2019  NORMAL LEFT VENTRICULAR SYSTOLIC FUNCTION  NORMAL RIGHT VENTRICULAR SYSTOLIC FUNCTION  MILD  VALVULAR REGURGITATION (See above)  NO VALVULAR STENOSIS  Closest EF: >55% (Estimated)  Aortic: MILD AR  Mitral: MILD MR  Tricuspid: MILD TR  Echo showed enlarged left and right atrium  Atherosclerosis of Aorta: discussed CT results discussed statin therapy and aspirin . Unchnaged     Patient Active Problem List   Diagnosis Date Noted  . History of prolonged Q-T interval on ECG 11/18/2019  . GAD (generalized anxiety  disorder) 10/13/2019  . MDD (major depressive disorder), recurrent episode, moderate ([place]) 10/13/2019  . At risk for long QT syndrome 10/13/2019  . Nonrheumatic mitral valve regurgitation 05/04/2019  . Benign neoplasm of descending colon   . Polyp of sigmoid colon   . Steroid-induced diabetes ([place]) 02/25/2017  . Polyneuropathy 10/21/2014  . Chronic venous insufficiency 10/05/2014  . Bilateral leg edema 08/16/2014  . Major depression in partial remission ([place]) 08/16/2014  . Acid reflux 08/16/2014  . 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 Asthma, moderate persistent 08/16/2014  . Carpal tunnel syndrome 08/16/2014  . Cervical pain 08/16/2014  . CAFL (chronic airflow limitation) ([place]) 08/16/2014  . Type 2 diabetes mellitus with peripheral neuropathy ([place]) 08/16/2014  . Diabetes mellitus type 2, insulin dependent ([place]) 08/16/2014  . Dyslipidemia 08/16/2014  . Tobacco use disorder 08/16/2014  . Essential (primary) hypertension 08/16/2014  . Benign neoplasm of stomach 08/16/2014  . Gout 08/16/2014  . HLD (hyperlipidemia) 08/16/2014  . Low back pain 08/16/2014  . Lumbar radiculopathy 08/16/2014  . Headache, migraine 08/16/2014  . Arthralgia of multiple joints 08/16/2014  . Avitaminosis D 08/16/2014  . Primary osteoarthritis of both knees 06/22/2014  . Benign essential tremor 10/02/2013  . Cervical dystonia 10/02/2013  . Chronic diastolic heart failure ([place]) 11/16/2012  . Chronic pain 11/13/2012    Past Surgical History:  Procedure Laterality Date  . CARPAL TUNNEL RELEASE Bilateral    x2 right, 1x on left  . COLONOSCOPY    . COLONOSCOPY WITH PROPOFOL N/A 04/25/2017   Procedure: COLONOSCOPY WITH PROPOFOL;  Surgeon: [name], MD;  Location: [facility];  Service: Endoscopy;  Laterality: N/A;  diabetic-oral med  . DILATION AND CURETTAGE OF UTERUS    . EXTERNAL EAR SURGERY Left    x2  . POLYPECTOMY  04/25/2017   Procedure: POLYPECTOMY INTESTINAL;  Surgeon: [name], MD;  Location: [facility];  Service: Endoscopy;;  . SPINE SURGERY     herniated disc  . TUBAL LIGATION      Family History  Problem Relation Age of Onset  . Emphysema Mother   . Anxiety disorder Mother   . Stroke Father   . Throat cancer Father   . Multiple sclerosis Daughter   . Bipolar disorder Daughter   . Cervical cancer Daughter   . Bipolar disorder Daughter   . Drug abuse Daughter   . Lung cancer Maternal Aunt   . Lung cancer Maternal Uncle   . Lung cancer Maternal Grandmother   . Lung cancer Maternal Grandfather     Social History   Tobacco Use  . Smoking status: Current Every Day Smoker    Packs/day: 1.00    Years: 41.00    Pack years: 41.00    Types: Cigarettes    Start date: 05/19/1977  . Smokeless tobacco: Never Used  Substance Use Topics  . Alcohol use: No    Alcohol/week: 0.0 standard drinks     Current Outpatient Medications:  .  albuterol (VENTOLIN HFA) 108 (90 Base) MCG/ACT inhaler, INHALE 1 PUFF BY  MOUTH AS NEEDED, Disp: 18 g, Rfl: 1 .  blood glucose meter kit and supplies KIT, Dispense accuchek aviva plus; can change if needed e11.9, Disp: 1 each, Rfl: 0 .  budesonide-formoterol (SYMBICORT) 160-4.5 MCG/ACT inhaler, USE 2 INHALATIONS INTO THE  LUNGS 2 TIMES DAILY, Disp: , Rfl:  .  busPIRone (BUSPAR) 7.5 MG tablet, Take 7.5 mg by mouth 2 (two) times daily. , Disp: , Rfl:  .  carbidopa-levodopa (SINEMET IR) 25-100 MG tablet, Take by mouth., Disp: , Rfl:  .  carbidopa-levodopa (SINEMET IR) 25-100 MG tablet, Take 1 tablet by mouth 3 (three) times daily., Disp: , Rfl:  .  cetirizine (ZYRTEC) 10 MG tablet, Take 10 mg by mouth as needed for allergies. , Disp: , Rfl:  .  colchicine (COLCRYS) 0.6 MG tablet, Take 1 tablet (0.6 mg total) by mouth daily., Disp: 30 tablet, Rfl: 0 .  cyclobenzaprine (FLEXERIL) 10 MG tablet, Take 10 mg by mouth 3 (three) times daily. , Disp: , Rfl:  .  doxycycline (VIBRAMYCIN) 100 MG capsule, Take by mouth., Disp: ,  Rfl:  .  EMGALITY 120 MG/ML SOAJ, Inject 1 mL into the skin every 30 (thirty) days., Disp: , Rfl:  .  FLUoxetine (PROZAC) 20 MG capsule, Take 1 capsule (20 mg total) by mouth daily. For anxiety and depression, Disp: 30 capsule, Rfl: 1 .  Fremanezumab-vfrm 225 MG/1.5ML SOAJ, Inject into the skin. , Disp: , Rfl:  .  furosemide (LASIX) 40 MG tablet, Take 1 tablet (40 mg total) by mouth daily as needed., Disp: 30 tablet, Rfl: 2 .  gabapentin (NEURONTIN) 600 MG tablet, Take 1 tablet (600 mg total) by mouth 3 (three) times daily., Disp: 90 tablet, Rfl: 3 .  glucose blood test strip, Use as directed to check blood glucose daily, Disp: 100 each, Rfl: 2 .  HUMALOG KWIKPEN 100 UNIT/ML KwikPen, INJECT SUBCUTANEOUSLY 5 TO  10 UNITS 3 TIMES DAILY, Disp: 30 mL, Rfl: 2 .  hydroCHLOROthiazide (MICROZIDE PO), hydrochlorothiazide, Disp: , Rfl:  .  hydrOXYzine (VISTARIL) 50 MG capsule, Take 1 capsule (50 mg total) by mouth 2 (two) times daily as needed. For severe anxiety symptoms, Disp: 60 capsule, Rfl: 1 .  ibuprofen (ADVIL) 600 MG tablet, Take by mouth. , Disp: , Rfl:  .  indomethacin (INDOCIN) 50 MG capsule, indomethacin 50 mg capsule, Disp: , Rfl:  .  insulin glargine (LANTUS SOLOSTAR) 100 UNIT/ML Solostar Pen, Inject 20 Units into the skin daily., Disp: 15 pen, Rfl: 1 .  Insulin Pen Needle 32G X 6 MM MISC, 2 each by Does not apply route daily., Disp: 200 each, Rfl: 2 .  ipratropium-albuterol (DUONEB) 0.5-2.5 (3) MG/3ML SOLN, Inhale 3 mLs into the lungs every 6 (six) hours as needed., Disp: 360 mL, Rfl: 3 .  metFORMIN (GLUCOPHAGE-XR) 750 MG 24 hr tablet, Take 2 tablets (1,500 mg total) by mouth daily with breakfast., Disp: 180 tablet, Rfl: 1 .  methylPREDNISolone (MEDROL DOSEPAK) 4 MG TBPK tablet, See admin instructions., Disp: , Rfl:  .  montelukast (SINGULAIR) 10 MG tablet, TAKE 1 TABLET BY MOUTH  DAILY IN THE AFTERNOON, Disp: 90 tablet, Rfl: 2 .  morphine (MS CONTIN) 30 MG 12 hr tablet, morphine ER 30 mg  tablet,extended release, Disp: , Rfl:  .  naproxen (NAPROSYN) 500 MG tablet, Take 500 mg by mouth 2 (two) times daily. , Disp: , Rfl:  .  omeprazole (PRILOSEC) 40 MG capsule, Take 1 capsule (40 mg total) by mouth daily., Disp: 90 capsule, Rfl:  1 .  Oxycodone HCl 10 MG TABS, oxycodone 10 mg tablet, Disp: , Rfl:  .  Oxycodone HCl 10 MG TABS, Take 10 mg by mouth 4 (four) times daily as needed. , Disp: , Rfl:  .  promethazine (PHENERGAN) 25 MG tablet, , Disp: , Rfl:  .  RELISTOR 150 MG TABS, , Disp: , Rfl:  .  rosuvastatin (CRESTOR) 5 MG tablet, Take 1 tablet (5 mg total) by mouth at bedtime., Disp: 90 tablet, Rfl: 1 .  theophylline (UNIPHYL) 400 MG 24 hr tablet, Take 1 tablet by mouth daily. , Disp: , Rfl:  .  tiotropium (SPIRIVA HANDIHALER) 18 MCG inhalation capsule, Place 1 capsule into inhaler and inhale daily. pm, Disp: , Rfl:  .  topiramate (TOPAMAX) 100 MG tablet, Take 1 tablet by mouth 2 (two) times daily. , Disp: , Rfl:  .  traZODone (DESYREL) 50 MG tablet, TAKE 1 TABLET BY MOUTH AT  BEDTIME, Disp: 90 tablet, Rfl: 1 .  Ubrogepant (UBRELVY) 100 MG TABS, Take by mouth., Disp: , Rfl:  .  venlafaxine XR (EFFEXOR XR) 75 MG 24 hr capsule, Take 1 capsule (75 mg total) by mouth daily with breakfast., Disp: 30 capsule, Rfl: 1  Allergies  Allergen Reactions  . Augmentin [Amoxicillin-Pot Clavulanate] Diarrhea  . Penicillins Itching    I personally reviewed active problem list, medication list, allergies, family history, social history, health maintenance with the patient/caregiver today.   ROS  Constitutional: Negative for fever or weight change.  Respiratory: positive  for cough and shortness of breath.   Cardiovascular: Negative for chest pain or palpitations.  Gastrointestinal: Negative for abdominal pain, no bowel changes.  Musculoskeletal: Negative for gait problem or joint swelling.  Skin: Negative for rash.  Neurological: Negative for dizziness or headache.  No other specific  complaints in a complete review of systems (except as listed in HPI above).  Objective  Vitals:   12/07/19 1412 12/07/19 1413  BP: (!) 80/58 100/60  Pulse: 96   Resp: 20   Temp: 97.8 F (36.6 C)   TempSrc: Oral   SpO2: (!) 89% 94%  Weight: 172 lb (78 kg)   Height: 5' 9"  (1.753 m)     Body mass index is 25.4 kg/m.  Physical Exam  Constitutional: Patient appears well-developed and well-nourished.  No distress.  HEENT: head atraumatic, normocephalic, pupils equal and reactive to light,  neck supple Cardiovascular: Normal rate, regular rhythm and normal heart sounds.  No murmur heard. No BLE edema. Pulmonary/Chest: Effort normal and breath sounds initially some rhonchi but it cleared  No respiratory distress. Abdominal: Soft.  There is no tenderness. Psychiatric: Patient has a normal mood and affect. behavior is normal. Judgment and thought content normal.  Recent Results (from the past 2160 hour(s))  SARS CORONAVIRUS 2 (TAT 6-24 HRS) Nasopharyngeal Nasopharyngeal Swab     Status: None   Collection Time: 09/19/19 12:59 AM   Specimen: Nasopharyngeal Swab  Result Value Ref Range   SARS Coronavirus 2 NEGATIVE NEGATIVE    Comment: (NOTE) SARS-CoV-2 target nucleic acids are NOT DETECTED.  The SARS-CoV-2 RNA is generally detectable in upper and lower respiratory specimens during the acute phase of infection. Negative results do not preclude SARS-CoV-2 infection, do not rule out co-infections with other pathogens, and should not be used as the sole basis for treatment or other patient management decisions. Negative results must be combined with clinical observations, patient history, and epidemiological information. The expected result is Negative.  Fact Sheet for Patients:  SugarRoll.be  Fact Sheet for Healthcare Providers: https://www.woods-mathews.com/  This test is not yet approved or cleared by the [place] FDA and  has been  authorized for detection and/or diagnosis of SARS-CoV-2 by FDA under an Emergency Use Authorization (EUA). This EUA will remain  in effect (meaning this test can be used) for the duration of the COVID-19 declaration under Se ction 564(b)(1) of the Act, 21 U.S.C. section 360bbb-3(b)(1), unless the authorization is terminated or revoked sooner.  Performed at [facility], [place] [address] [place], [place] [address]   Novel Coronavirus, NAA ([facility])     Status: None   Collection Time: 11/16/19  4:12 PM   Specimen: Nasopharyngeal(NP) swabs in vial transport medium   Nasopharynge  Screenin  Result Value Ref Range   SARS-CoV-2, NAA Comment Not Detected    Comment: We are unable to reliably determine a result for the specimen due to the presence of PCR inhibitor(s) in the specimen submitted.  If clinically indicated, please recollect an additional specimen for testing. This nucleic acid amplification test was developed and its performance characteristics determined by [employer] and [employer]. Nucleic acid amplification tests include RT-PCR and TMA. This test has not been FDA cleared or approved. This test has been authorized by FDA under an Emergency Use Authorization (EUA). This test is only authorized for the duration of time the declaration that circumstances exist justifying the authorization of the emergency use of in vitro diagnostic tests for detection of SARS-CoV-2 virus and/or diagnosis of COVID-19 infection under section 564(b)(1) of the Act, 21 U.S.C. 818HUD-1(S) (1), unless the authorization is terminated or revoked sooner. When diagnostic testing is negative, the possibility of a false negative result should be considere d in the context of a patient's recent exposures and the presence of clinical signs and symptoms consistent with COVID-19. An individual without symptoms of COVID-19 and who is not shedding SARS-CoV-2 virus would expect to have a negative (not detected)  result in this assay.   Novel Coronavirus, NAA ([facility])     Status: None   Collection Time: 11/19/19  5:41 PM   Specimen: Nasopharyngeal(NP) swabs in vial transport medium   Nasopharynge  Screenin  Result Value Ref Range   SARS-CoV-2, NAA Not Detected Not Detected    Comment: This nucleic acid amplification test was developed and its performance characteristics determined by [employer], [employer]. Nucleic acid amplification tests include RT-PCR and TMA. This test has not been FDA cleared or approved. This test has been authorized by FDA under an Emergency Use Authorization (EUA). This test is only authorized for the duration of time the declaration that circumstances exist justifying the authorization of the emergency use of in vitro diagnostic tests for detection of SARS-CoV-2 virus and/or diagnosis of COVID-19 infection under section 564(b)(1) of the Act, 21 U.S.C. 970YOV-7(C) (1), unless the authorization is terminated or revoked sooner. When diagnostic testing is negative, the possibility of a false negative result should be considered in the context of a patient's recent exposures and the presence of clinical signs and symptoms consistent with COVID-19. An individual without symptoms of COVID-19 and who is not shedding SARS-CoV-2 virus wo uld expect to have a negative (not detected) result in this assay.   SARS-COV-2, NAA 2 DAY TAT     Status: None   Collection Time: 11/19/19  5:41 PM   Nasopharynge  Screenin  Result Value Ref Range   SARS-CoV-2, NAA 2 DAY TAT Performed       PHQ2/9: Depression screen [facility] 2/9  10/14/2019 08/05/2019 05/04/2019 01/27/2019 01/22/2019  Decreased Interest 3 1 1 1 1   Down, Depressed, Hopeless 3 1 3 3 3   PHQ - 2 Score 6 2 4 4 4   Altered sleeping 2 3 3  0 0  Tired, decreased energy 3 3 3 2 2   Change in appetite 3 3 1 2 2   Feeling bad or failure about yourself  2 1 1 3 3   Trouble concentrating 3 3 2 3 3   Moving slowly or fidgety/restless 0 1 1 1 1    Suicidal thoughts 0 0 0 0 0  PHQ-9 Score 19 16 15 15 15   Difficult doing work/chores - Not difficult at all Not difficult at all Somewhat difficult Somewhat difficult  Some recent data might be hidden    phq 9 is positive   Fall Risk: Fall Risk  12/07/2019 10/14/2019 08/05/2019 05/04/2019 01/27/2019  Falls in the past year? 1 1 0 1 1  Number falls in past yr: 1 1 0 1 1  Injury with Fall? 1 1 0 0 0  Comment - - - - -  Risk for fall due to : History of fall(s) - - - Impaired balance/gait;History of fall(s)  Risk for fall due to: Comment - - - - -  Follow up - - - - Falls prevention discussed     Functional Status Survey: Is the patient deaf or have difficulty hearing?: Yes Does the patient have difficulty seeing, even when wearing glasses/contacts?: Yes Does the patient have difficulty concentrating, remembering, or making decisions?: Yes Does the patient have difficulty walking or climbing stairs?: No Does the patient have difficulty dressing or bathing?: No Does the patient have difficulty doing errands alone such as visiting a doctor's office or shopping?: No    Assessment & Plan  1. Type 2 diabetes mellitus with peripheral neuropathy (HCC)  - POCT HgB A1C  2. DM type 2 with diabetic dyslipidemia (HCC)  - rosuvastatin (CRESTOR) 5 MG tablet; Take 1 tablet (5 mg total) by mouth at bedtime.  Dispense: 90 tablet; Refill: 1  3. Dyslipidemia (high LDL; low HDL)  - rosuvastatin (CRESTOR) 5 MG tablet; Take 1 tablet (5 mg total) by mouth at bedtime.  Dispense: 90 tablet; Refill: 1  4. Parkinson's disease ([place])  Seeing Dr. Manuella Ghazi and on medication  5. GAD (generalized anxiety disorder)   6. Atherosclerosis of aorta ([place])  On statin therapy   7. Centrilobular emphysema ([place])   8. COPD with asthma ([place])  Recent flare but stable  9. Chronic diastolic heart failure [facility])  Seeing cardiologist   10. Essential (primary) hypertension  bp is towards low end of normal    11. MDD (major depressive disorder), recurrent episode, moderate ([place]) ' On medication and seeing psychiatrist   12. Senile purpura (HCC)  On both arms   13. Gastroesophageal reflux disease without esophagitis  - omeprazole (PRILOSEC) 40 MG capsule; Take 1 capsule (40 mg total) by mouth daily.  Dispense: 90 capsule; Refill: 1

## 2019-12-05 NOTE — Telephone Encounter (Signed)
Requested Prescriptions  Pending Prescriptions Disp Refills  . Venlafaxine HCl 225 MG TB24 [Pharmacy Med Name: VENLAFAXINE ER TAB 225MG ] 90 tablet     Sig: TAKE 1 TABLET BY MOUTH  DAILY     Psychiatry: Antidepressants - SNRI - desvenlafaxine & venlafaxine Failed - 12/04/2019  9:26 PM      Failed - Triglycerides in normal range and within 360 days    Triglycerides  Date Value Ref Range Status  08/05/2019 193 (H) <150 mg/dL Final  10/09/2012 172 0 - 200 mg/dL Final         Passed - LDL in normal range and within 360 days    Ldl Cholesterol, Calc  Date Value Ref Range Status  10/09/2012 85 0 - 100 mg/dL Final   LDL Cholesterol (Calc)  Date Value Ref Range Status  08/05/2019 80 mg/dL (calc) Final    Comment:    Reference range: <100 . Desirable range <100 mg/dL for primary prevention;   <70 mg/dL for patients with CHD or diabetic patients  with > or = 2 CHD risk factors. Marland Kitchen LDL-C is now calculated using the Martin-Hopkins  calculation, which is a validated novel method providing  better accuracy than the Friedewald equation in the  estimation of LDL-C.  Cresenciano Genre et al. Annamaria Helling. 7341;937(90): 2061-2068  (http://education.[facility].com/faq/FAQ164)          Passed - Total Cholesterol in normal range and within 360 days    Cholesterol, Total  Date Value Ref Range Status  07/06/2015 163 100 - 199 mg/dL Final   Cholesterol  Date Value Ref Range Status  08/05/2019 148 <200 mg/dL Final  10/09/2012 144 0 - 200 mg/dL Final         Passed - Completed PHQ-2 or PHQ-9 in the last 360 days.      Passed - Last BP in normal range    BP Readings from Last 1 Encounters:  10/14/19 110/70         Passed - Valid encounter within last 6 months    Recent Outpatient Visits          1 month ago Parkinson's disease [facility])   [facility] [name], MD   4 months ago Atherosclerosis of aorta [facility])   [facility] [name], MD   7  months ago [facility] Lebron Conners D, MD   10 months ago Moderate persistent asthma with acute exacerbation   [place], [name]   1 year ago Diabetes mellitus type 2, insulin dependent [facility])   [facility] [name], MD      Future Appointments            In 2 days [name], MD [facility], [place]   In 1 month  [facility], [place]           . traZODone (DESYREL) 50 MG tablet [Pharmacy Med Name: traZODone HCl 50 MG Oral Tablet] 90 tablet 1    Sig: TAKE 1 TABLET BY MOUTH AT  BEDTIME     Psychiatry: Antidepressants - Serotonin Modulator Passed - 12/04/2019  9:26 PM      Passed - Completed PHQ-2 or PHQ-9 in the last 360 days.      Passed - Valid encounter within last 6 months    Recent Outpatient Visits          1 month ago Parkinson's disease ([place])  [facility] [name], MD   4 months ago Atherosclerosis of aorta [facility])   [facility] [name], MD   7 months ago [facility] Lebron Conners D, MD   10 months ago Moderate persistent asthma with acute exacerbation   [place], [name]   1 year ago Diabetes mellitus type 2, insulin dependent [facility])   [facility] [name], MD      Future Appointments            In 2 days [name], MD [facility], [place]   In 1 month  [place]

## 2019-12-07 ENCOUNTER — Encounter: Payer: Self-pay | Admitting: Family Medicine

## 2019-12-07 ENCOUNTER — Other Ambulatory Visit: Payer: Self-pay

## 2019-12-07 ENCOUNTER — Ambulatory Visit (INDEPENDENT_AMBULATORY_CARE_PROVIDER_SITE_OTHER): Payer: Medicare Other | Admitting: Family Medicine

## 2019-12-07 VITALS — BP 100/60 | HR 96 | Temp 97.8°F | Resp 20 | Ht 69.0 in | Wt 172.0 lb

## 2019-12-07 DIAGNOSIS — E1142 Type 2 diabetes mellitus with diabetic polyneuropathy: Secondary | ICD-10-CM | POA: Diagnosis not present

## 2019-12-07 DIAGNOSIS — G20 Parkinson's disease: Secondary | ICD-10-CM

## 2019-12-07 DIAGNOSIS — D692 Other nonthrombocytopenic purpura: Secondary | ICD-10-CM

## 2019-12-07 DIAGNOSIS — E1169 Type 2 diabetes mellitus with other specified complication: Secondary | ICD-10-CM

## 2019-12-07 DIAGNOSIS — E785 Hyperlipidemia, unspecified: Secondary | ICD-10-CM

## 2019-12-07 DIAGNOSIS — F331 Major depressive disorder, recurrent, moderate: Secondary | ICD-10-CM

## 2019-12-07 DIAGNOSIS — I5032 Chronic diastolic (congestive) heart failure: Secondary | ICD-10-CM

## 2019-12-07 DIAGNOSIS — J449 Chronic obstructive pulmonary disease, unspecified: Secondary | ICD-10-CM

## 2019-12-07 DIAGNOSIS — J432 Centrilobular emphysema: Secondary | ICD-10-CM

## 2019-12-07 DIAGNOSIS — F411 Generalized anxiety disorder: Secondary | ICD-10-CM

## 2019-12-07 DIAGNOSIS — I10 Essential (primary) hypertension: Secondary | ICD-10-CM

## 2019-12-07 DIAGNOSIS — I700 Atherosclerosis of aorta: Secondary | ICD-10-CM

## 2019-12-07 DIAGNOSIS — K219 Gastro-esophageal reflux disease without esophagitis: Secondary | ICD-10-CM

## 2019-12-07 LAB — POCT GLYCOSYLATED HEMOGLOBIN (HGB A1C): Hemoglobin A1C: 6.9 % — AB (ref 4.0–5.6)

## 2019-12-07 MED ORDER — OMEPRAZOLE 40 MG PO CPDR: 40.0000 mg | DELAYED_RELEASE_CAPSULE | Freq: Every day | ORAL | 1 refills | Status: DC

## 2019-12-07 MED ORDER — ROSUVASTATIN CALCIUM 5 MG PO TABS: 5.0000 mg | ORAL_TABLET | Freq: Every day | ORAL | 1 refills | Status: DC

## 2019-12-08 ENCOUNTER — Ambulatory Visit (INDEPENDENT_AMBULATORY_CARE_PROVIDER_SITE_OTHER): Payer: Medicare Other | Admitting: Licensed Clinical Social Worker

## 2019-12-08 ENCOUNTER — Encounter: Payer: Self-pay | Admitting: Licensed Clinical Social Worker

## 2019-12-08 DIAGNOSIS — F411 Generalized anxiety disorder: Secondary | ICD-10-CM

## 2019-12-08 DIAGNOSIS — F331 Major depressive disorder, recurrent, moderate: Secondary | ICD-10-CM

## 2019-12-08 NOTE — Progress Notes (Signed)
Virtual Visit via Telephone Note  I connected with Tricia [name] on 12/08/19 at  2:00 PM EDT by telephone and verified that I am speaking with the correct person using two identifiers.  Location: Patient: Home Provider: Home Office   I discussed the limitations, risks, security and privacy concerns of performing an evaluation and management service by telephone and the availability of in person appointments. I also discussed with the patient that there may be a patient responsible charge related to this service. The patient expressed understanding and agreed to proceed.   THERAPY PROGRESS NOTE  Session Time: 65 Minutes  Participation Level: Active  Behavioral Response: AlertDepressed  Type of Therapy: Individual Therapy  Treatment Goals addressed: Coping  Interventions: CBT  Summary: Tricia [name] is a 59 y.o. female who presents with depression and anxiety sxs. Pt reported experiencing "a lot of ups and downs". The ups include steady relationship with one of her daughters and getting new carpet after 20 years. Pt reported the downs include busted water heater that leaked onto the floors, feeling depressed and lonely. Pt reported she tends to feel more depressed closer to the holidays and sees them "as just any other day". Pt identified having trust issues and low motivation. Pt identified what she would like to see happen for herself.   Suicidal/Homicidal: No  Therapist Response: Therapist met with patient for follow up session. Therapist and patient explored depression sxs, negative thinking and triggers. Therapist validated patient feelings. Therapist provided psychoeducation around CBT and stages of change. Pt was receptive.  Plan: Return again in 2 weeks.  Diagnosis: Axis I: Generalized Anxiety Disorder and MDD, Recurrent, Moderate    Axis II: N/A    Josephine Igo, LCSW, LCAS 12/08/2019

## 2019-12-15 DIAGNOSIS — G894 Chronic pain syndrome: Secondary | ICD-10-CM | POA: Diagnosis not present

## 2019-12-15 DIAGNOSIS — M542 Cervicalgia: Secondary | ICD-10-CM | POA: Diagnosis not present

## 2019-12-15 DIAGNOSIS — Z79891 Long term (current) use of opiate analgesic: Secondary | ICD-10-CM | POA: Diagnosis not present

## 2019-12-15 DIAGNOSIS — M5416 Radiculopathy, lumbar region: Secondary | ICD-10-CM | POA: Diagnosis not present

## 2019-12-15 DIAGNOSIS — M25569 Pain in unspecified knee: Secondary | ICD-10-CM | POA: Diagnosis not present

## 2019-12-21 ENCOUNTER — Other Ambulatory Visit: Payer: Self-pay | Admitting: Family Medicine

## 2019-12-21 ENCOUNTER — Telehealth (INDEPENDENT_AMBULATORY_CARE_PROVIDER_SITE_OTHER): Payer: Medicare Other | Admitting: Psychiatry

## 2019-12-21 ENCOUNTER — Encounter: Payer: Self-pay | Admitting: Psychiatry

## 2019-12-21 ENCOUNTER — Other Ambulatory Visit: Payer: Self-pay

## 2019-12-21 DIAGNOSIS — F17200 Nicotine dependence, unspecified, uncomplicated: Secondary | ICD-10-CM | POA: Diagnosis not present

## 2019-12-21 DIAGNOSIS — J411 Mucopurulent chronic bronchitis: Secondary | ICD-10-CM

## 2019-12-21 DIAGNOSIS — F411 Generalized anxiety disorder: Secondary | ICD-10-CM | POA: Diagnosis not present

## 2019-12-21 DIAGNOSIS — F331 Major depressive disorder, recurrent, moderate: Secondary | ICD-10-CM | POA: Diagnosis not present

## 2019-12-21 MED ORDER — VENLAFAXINE HCL ER 37.5 MG PO CP24: 37.5000 mg | ORAL_CAPSULE | Freq: Every day | ORAL | 0 refills | Status: DC

## 2019-12-21 MED ORDER — FLUOXETINE HCL 40 MG PO CAPS: 40.0000 mg | ORAL_CAPSULE | Freq: Every day | ORAL | 1 refills | Status: DC

## 2019-12-21 NOTE — Progress Notes (Signed)
Virtual Visit via Telephone Note  I connected with Tricia [name] on 12/21/19 at  4:20 PM EDT by telephone and verified that I am speaking with the correct person using two identifiers.  Location Provider Location : [facility] Patient Location : Home  Participants: Patient , Provider   I discussed the limitations, risks, security and privacy concerns of performing an evaluation and management service by telephone and the availability of in person appointments. I also discussed with the patient that there may be a patient responsible charge related to this service. The patient expressed understanding and agreed to proceed.    I discussed the assessment and treatment plan with the patient. The patient was provided an opportunity to ask questions and all were answered. The patient agreed with the plan and demonstrated an understanding of the instructions.   The patient was advised to call back or seek an in-person evaluation if the symptoms worsen or if the condition fails to improve as anticipated.   Baker MD OP Progress Note  12/22/2019 11:30 AM LIS SAVITT  MRN:  [id number]  Chief Complaint:  Chief Complaint    Follow-up     HPI: Tricia [name] is a 59 year old Caucasian female on disability, divorced, lives in [place], has a history of GAD, MDD, tobacco use disorder, carpal tunnel syndrome, tobacco use disorder, atypical Parkinson's disease, polyneuropathy, cervical dystonia, migraine headaches, COPD, diastolic dysfunction, diabetes melitis, asthma, degenerative disc disease was evaluated by phone today.  Patient today reports she is currently struggling with anxiety and depressive symptoms.  She does not know if the medication changes have made a difference.  She does report having some sleepwalking episodes recently and she attributes that to the venlafaxine.  However she is not sure.  She wants to come off of the venlafaxine.  Patient denies any suicidality,  homicidality or perceptual disturbances.  Patient reports she is in psychotherapy sessions and is motivated to keep it.  Patient denies any other concerns today.  Visit Diagnosis:    ICD-10-CM   1. GAD (generalized anxiety disorder)  F41.1 venlafaxine XR (EFFEXOR-XR) 37.5 MG 24 hr capsule    FLUoxetine (PROZAC) 40 MG capsule  2. MDD (major depressive disorder), recurrent episode, moderate (HCC)  F33.1 venlafaxine XR (EFFEXOR-XR) 37.5 MG 24 hr capsule    FLUoxetine (PROZAC) 40 MG capsule  3. Tobacco use disorder  F17.200     Past Psychiatric History: I have reviewed past psychiatric history from my progress note on 10/13/2019.  Past trials of Cymbalta, sertraline, Lexapro, Xanax  Past Medical History:  Past Medical History:  Diagnosis Date  . Anxiety   . Arthritis    joints and hands/ knees  . Asthma    uses inhaler  . Benign essential tremor    head  . Cervical dystonia    neck pain  . Cholesteatoma of left ear    x2  . COPD (chronic obstructive pulmonary disease) ([place])   . Cough   . Depression   . Diabetes mellitus without complication ([place])    type 2  . Diastolic dysfunction   . Dyspnea   . Dysrhythmia    diastolic dysfunction  . GERD (gastroesophageal reflux disease)   . Headache    migraines/ one per week  . 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 Hyperlipidemia   . Hypertension   . Motion sickness    boat  . Neuromuscular disorder ([place])    neuropathy feet and hands(  nerve damage)  . Wears dentures    upper and lower    Past Surgical History:  Procedure Laterality Date  . CARPAL TUNNEL RELEASE Bilateral    x2 right, 1x on left  . COLONOSCOPY    . COLONOSCOPY WITH PROPOFOL N/A 04/25/2017   Procedure: COLONOSCOPY WITH PROPOFOL;  Surgeon: [name], MD;  Location: [facility];  Service: Endoscopy;  Laterality: N/A;  diabetic-oral med  . DILATION AND CURETTAGE OF UTERUS    . EXTERNAL EAR SURGERY Left    x2  . POLYPECTOMY  04/25/2017    Procedure: POLYPECTOMY INTESTINAL;  Surgeon: [name], MD;  Location: [facility];  Service: Endoscopy;;  . SPINE SURGERY     herniated disc  . TUBAL LIGATION      Family Psychiatric History: I have reviewed family psychiatric history from my progress note on 10/13/2019.   Family History:  Family History  Problem Relation Age of Onset  . Emphysema Mother   . Anxiety disorder Mother   . Stroke Father   . Throat cancer Father   . Multiple sclerosis Daughter   . Bipolar disorder Daughter   . Cervical cancer Daughter   . Bipolar disorder Daughter   . Drug abuse Daughter   . Lung cancer Maternal Aunt   . Lung cancer Maternal Uncle   . Lung cancer Maternal Grandmother   . Lung cancer Maternal Grandfather     Social History: I have reviewed social history from my progress note on 10/13/2019 Social History   Socioeconomic History  . Marital status: Divorced    Spouse name: Not on file  . Number of children: 2  . Years of education: Not on file  . Highest education level: Associate degree: academic program  Occupational History  . Occupation: Disability  Tobacco Use  . Smoking status: Current Every Day Smoker    Packs/day: 1.00    Years: 41.00    Pack years: 41.00    Types: Cigarettes    Start date: 05/19/1977  . Smokeless tobacco: Never Used  Vaping Use  . Vaping Use: Former  Substance and Sexual Activity  . Alcohol use: No    Alcohol/week: 0.0 standard drinks  . Drug use: No  . Sexual activity: Not Currently  Other Topics Concern  . Not on file  Social History Narrative   Lives with her boyfriend, she has two daughters.    Social Determinants of Health   Financial Resource Strain:   . Difficulty of Paying Living Expenses: Not on file  Food Insecurity:   . Worried About Charity fundraiser in the Last Year: Not on file  . Ran Out of Food in the Last Year: Not on file  Transportation Needs:   . Lack of Transportation (Medical): Not on file  . Lack of  Transportation (Non-Medical): Not on file  Physical Activity:   . Days of Exercise per Week: Not on file  . Minutes of Exercise per Session: Not on file  Stress:   . Feeling of Stress : Not on file  Social Connections: Unknown  . Frequency of Communication with Friends and Family: Not on file  . Frequency of Social Gatherings with Friends and Family: Not on file  . Attends Religious Services: Not on file  . Active Member of Clubs or Organizations: Not on file  . Attends Archivist Meetings: Not on file  . Marital Status: Living with partner    Allergies:  Allergies  Allergen Reactions  .  Augmentin [Amoxicillin-Pot Clavulanate] Diarrhea  . Penicillins Itching    Metabolic Disorder Labs: Lab Results  Component Value Date   HGBA1C 6.9 (A) 12/07/2019   MPG 148 08/05/2019   MPG 146 08/27/2018   No results found for: PROLACTIN Lab Results  Component Value Date   CHOL 148 08/05/2019   TRIG 193 (H) 08/05/2019   HDL 39 (L) 08/05/2019   CHOLHDL 3.8 08/05/2019   VLDL 34 10/09/2012   LDLCALC 80 08/05/2019   LDLCALC 50 10/29/2017   Lab Results  Component Value Date   TSH 0.31 (L) 08/05/2019   TSH 1.380 10/04/2014    Therapeutic Level Labs: No results found for: LITHIUM No results found for: VALPROATE No components found for:  CBMZ  Current Medications: Current Outpatient Medications  Medication Sig Dispense Refill  . albuterol (VENTOLIN HFA) 108 (90 Base) MCG/ACT inhaler INHALE 1 PUFF BY MOUTH AS NEEDED 18 g 1  . blood glucose meter kit and supplies KIT Dispense accuchek aviva plus; can change if needed e11.9 1 each 0  . budesonide-formoterol (SYMBICORT) 160-4.5 MCG/ACT inhaler USE 2 INHALATIONS INTO THE  LUNGS 2 TIMES DAILY    . busPIRone (BUSPAR) 7.5 MG tablet Take 7.5 mg by mouth 2 (two) times daily.     . carbidopa-levodopa (SINEMET IR) 25-100 MG tablet Take 1 tablet by mouth 3 (three) times daily.    . cetirizine (ZYRTEC) 10 MG tablet Take 10 mg by mouth  as needed for allergies.     Marland Kitchen colchicine (COLCRYS) 0.6 MG tablet Take 1 tablet (0.6 mg total) by mouth daily. 30 tablet 0  . cyclobenzaprine (FLEXERIL) 10 MG tablet Take 10 mg by mouth 3 (three) times daily.     Marland Kitchen EMGALITY 120 MG/ML SOAJ Inject 1 mL into the skin every 30 (thirty) days.    Marland Kitchen FLUoxetine (PROZAC) 40 MG capsule Take 1 capsule (40 mg total) by mouth daily. 30 capsule 1  . furosemide (LASIX) 40 MG tablet Take 1 tablet (40 mg total) by mouth daily as needed. 30 tablet 2  . gabapentin (NEURONTIN) 600 MG tablet Take 1 tablet (600 mg total) by mouth 3 (three) times daily. 90 tablet 3  . glucose blood test strip Use as directed to check blood glucose daily 100 each 2  . HUMALOG KWIKPEN 100 UNIT/ML KwikPen INJECT SUBCUTANEOUSLY 5 TO  10 UNITS 3 TIMES DAILY 30 mL 2  . hydroCHLOROthiazide (MICROZIDE PO) hydrochlorothiazide    . hydrOXYzine (VISTARIL) 50 MG capsule Take 1 capsule (50 mg total) by mouth 2 (two) times daily as needed. For severe anxiety symptoms 60 capsule 1  . ibuprofen (ADVIL) 600 MG tablet Take by mouth.     . indomethacin (INDOCIN) 50 MG capsule indomethacin 50 mg capsule    . insulin glargine (LANTUS SOLOSTAR) 100 UNIT/ML Solostar Pen Inject 20 Units into the skin daily. 15 pen 1  . Insulin Pen Needle 32G X 6 MM MISC 2 each by Does not apply route daily. 200 each 2  . ipratropium-albuterol (DUONEB) 0.5-2.5 (3) MG/3ML SOLN Inhale 3 mLs into the lungs every 6 (six) hours as needed. 360 mL 3  . metFORMIN (GLUCOPHAGE-XR) 750 MG 24 hr tablet Take 2 tablets (1,500 mg total) by mouth daily with breakfast. 180 tablet 1  . montelukast (SINGULAIR) 10 MG tablet TAKE 1 TABLET BY MOUTH  DAILY IN THE AFTERNOON 90 tablet 2  . morphine (MS CONTIN) 30 MG 12 hr tablet morphine ER 30 mg tablet,extended release    .  naproxen (NAPROSYN) 500 MG tablet Take 500 mg by mouth 2 (two) times daily.     Marland Kitchen omeprazole (PRILOSEC) 40 MG capsule Take 1 capsule (40 mg total) by mouth daily. 90 capsule 1  .  Oxycodone HCl 10 MG TABS oxycodone 10 mg tablet    . Oxycodone HCl 10 MG TABS Take 10 mg by mouth 4 (four) times daily as needed.     . promethazine (PHENERGAN) 25 MG tablet     . RELISTOR 150 MG TABS     . rosuvastatin (CRESTOR) 5 MG tablet Take 1 tablet (5 mg total) by mouth at bedtime. 90 tablet 1  . theophylline (UNIPHYL) 400 MG 24 hr tablet Take 1 tablet by mouth daily.     Marland Kitchen tiotropium (SPIRIVA HANDIHALER) 18 MCG inhalation capsule Place 1 capsule into inhaler and inhale daily. pm    . topiramate (TOPAMAX) 100 MG tablet Take 1 tablet by mouth 2 (two) times daily.     . traZODone (DESYREL) 50 MG tablet TAKE 1 TABLET BY MOUTH AT  BEDTIME 90 tablet 1  . Ubrogepant (UBRELVY) 100 MG TABS Take by mouth.    . venlafaxine XR (EFFEXOR-XR) 37.5 MG 24 hr capsule Take 1 capsule (37.5 mg total) by mouth daily with breakfast. Stop taking after 14 days 14 capsule 0   No current facility-administered medications for this visit.     Musculoskeletal: Strength & Muscle Tone: UTA Gait & Station: UTA Patient leans: N/A  Psychiatric Specialty Exam: Review of Systems  Neurological: Positive for tremors.  Psychiatric/Behavioral: Positive for dysphoric mood. The patient is nervous/anxious.   All other systems reviewed and are negative.   There were no vitals taken for this visit.There is no height or weight on file to calculate BMI.  General Appearance: UTA  Eye Contact:  UTA  Speech:  Normal Rate  Volume:  Normal  Mood:  Anxious and Depressed  Affect:  UTA  Thought Process:  Goal Directed and Descriptions of Associations: Intact  Orientation:  Full (Time, Place, and Person)  Thought Content: Logical   Suicidal Thoughts:  No  Homicidal Thoughts:  No  Memory:  Immediate;   Fair Recent;   Fair Remote;   Fair  Judgement:  Fair  Insight:  Fair  Psychomotor Activity:  UTA  Concentration:  Concentration: Fair and Attention Span: Fair  Recall:  [employer] of Knowledge: Fair  Language: Fair   Akathisia:  No  Handed:  Right  AIMS (if indicated): UTA  Assets:  Communication Skills Desire for Improvement Social Support  ADL's:  Intact  Cognition: WNL  Sleep:  Fair   Screenings: GAD-7     Office Visit from 03/19/2018 in [facility] Office Visit from 01/30/2018 in [facility] Office Visit from 10/29/2017 in [facility]  Total GAD-7 Score 21 21 17     PHQ2-9     Office Visit from 12/07/2019 in [facility] Office Visit from 10/14/2019 in [facility] Office Visit from 08/05/2019 in [facility] Office Visit from 05/04/2019 in [facility] from 01/27/2019 in [facility]  PHQ-2 Total Score 4 6 2 4 4   PHQ-9 Total Score 16 19 16 15 15        Assessment and Plan: TALISE SLIGH is a 59 year old Caucasian female on disability, lives in [place], has a history of MDD, GAD, degenerative disc disease, diastolic dysfunction, diabetes melitis, migraine headaches  as well as atypical Parkinson's disease was evaluated by phone today.  Patient is biologically predisposed given her history of trauma, multiple medical problems.  Patient with psychosocial stressors of recent health problems, diagnosis of atypical Parkinson's disease as well as boyfriend's health issues.  Patient will continue to benefit from cross titration of her venlafaxine with Prozac.  Plan as noted below.  Plan MDD-unstable We will reduce venlafaxine to 37.5 mg p.o. daily.  Advised patient to stop taking it after 14 days. Increase Prozac to 40 mg p.o. daily with breakfast. BuSpar 7.5 mg p.o. twice daily Continue CBT with Ms. Zadie Rhine.  GAD-unstable Increase Prozac to 40 mg p.o. daily BuSpar 7.5 mg p.o. twice daily Hydroxyzine 50 mg p.o. twice daily as needed. Patient to continue CBT  Tobacco use disorder-unstable Provided  counseling.  Follow-up in clinic in 3 weeks or sooner if needed.  I have spent atleast 20 minutes non face to face  with patient today. More than 50 % of the time was spent for preparing to see the patient ( e.g., review of test, records ),  ordering medications and test ,psychoeducation and supportive psychotherapy and care coordination,as well as documenting clinical information in electronic health record. This note was generated in part or whole with voice recognition software. Voice recognition is usually quite accurate but there are transcription errors that can and very often do occur. I apologize for any typographical errors that were not detected and corrected.      Ursula Alert, MD 12/22/2019, 11:30 AM

## 2019-12-24 ENCOUNTER — Ambulatory Visit: Payer: Medicare Other | Admitting: Licensed Clinical Social Worker

## 2019-12-24 ENCOUNTER — Other Ambulatory Visit: Payer: Self-pay

## 2020-01-11 ENCOUNTER — Other Ambulatory Visit: Payer: Self-pay | Admitting: Psychiatry

## 2020-01-11 DIAGNOSIS — F411 Generalized anxiety disorder: Secondary | ICD-10-CM

## 2020-01-11 DIAGNOSIS — F331 Major depressive disorder, recurrent, moderate: Secondary | ICD-10-CM

## 2020-01-18 ENCOUNTER — Encounter: Payer: Self-pay | Admitting: Psychiatry

## 2020-01-18 ENCOUNTER — Telehealth (INDEPENDENT_AMBULATORY_CARE_PROVIDER_SITE_OTHER): Payer: Medicare Other | Admitting: Psychiatry

## 2020-01-18 ENCOUNTER — Other Ambulatory Visit: Payer: Self-pay

## 2020-01-18 DIAGNOSIS — F411 Generalized anxiety disorder: Secondary | ICD-10-CM

## 2020-01-18 DIAGNOSIS — F331 Major depressive disorder, recurrent, moderate: Secondary | ICD-10-CM

## 2020-01-18 DIAGNOSIS — F17200 Nicotine dependence, unspecified, uncomplicated: Secondary | ICD-10-CM

## 2020-01-18 NOTE — Progress Notes (Signed)
Virtual Visit via Telephone Note  I connected with Tricia [name] on 01/18/20 at  9:40 AM EST by telephone and verified that I am speaking with the correct person using two identifiers.  Location Provider Location : [facility] Patient Location : Home  Participants: Patient , Provider    I discussed the limitations, risks, security and privacy concerns of performing an evaluation and management service by telephone and the availability of in person appointments. I also discussed with the patient that there may be a patient responsible charge related to this service. The patient expressed understanding and agreed to proceed   I discussed the assessment and treatment plan with the patient. The patient was provided an opportunity to ask questions and all were answered. The patient agreed with the plan and demonstrated an understanding of the instructions.   The patient was advised to call back or seek an in-person evaluation if the symptoms worsen or if the condition fails to improve as anticipated.  [place] MD OP Progress Note  01/18/2020 11:34 AM Tricia [name]  MRN:  [id number]  Chief Complaint:  Chief Complaint    Follow-up     HPI: Tricia [name] is a 59 year old Caucasian female on disability, divorced, lives in [place], has a history of GAD, MDD, tobacco use disorder, carpal tunnel syndrome, atypical Parkinson's disease, polyneuropathy, cervical dystonia, migraine headaches, COPD, diastolic dysfunction, diabetes melitis, asthma, degenerative disc disease was evaluated by phone today.  Patient today reports she is currently struggling with a lot of nausea and vomiting.  She has not reached out her primary care provider yet.  She however reports she does have a prescription for Phenergan since she has been struggling with nausea for a very long time and was prescribed Phenergan by her primary care provider in the past.  She does not believe the Prozac could be  contributing to her nausea and vomiting since she had it even before.  She does not want to stop the medication yet.  She is completely off of the venlafaxine.  She reports she continues to have anxiety symptoms especially due to her physical problems.  She agrees to get in touch with her therapist and to have more frequent therapy sessions since she has nausea now and is not interested in medication readjustment.  She wonders why she was taken off of the benzodiazepine.  She is aware that she is on opioid pain medications and benzodiazepines does have an interaction with her opioids.  She is also aware about the long-term risk factors of benzodiazepines.  She however wonders that instead of taking benzodiazepines she is now on 3 different medications for anxiety.  Patient denies any suicidality, homicidality or perceptual disturbances.  Visit Diagnosis:    ICD-10-CM   1. GAD (generalized anxiety disorder)  F41.1   2. MDD (major depressive disorder), recurrent episode, moderate (HCC)  F33.1   3. Tobacco use disorder  F17.200     Past Psychiatric History: I have reviewed past psychiatric history from my progress note on 10/13/2019.  Past trials of Cymbalta, sertraline, Lexapro, Xanax  Past Medical History:  Past Medical History:  Diagnosis Date  . Anxiety   . Arthritis    joints and hands/ knees  . Asthma    uses inhaler  . Benign essential tremor    head  . Cervical dystonia    neck pain  . Cholesteatoma of left ear    x2  . COPD (chronic obstructive pulmonary disease) ([place])   .  Cough   . Depression   . Diabetes mellitus without complication ([place])    type 2  . Diastolic dysfunction   . Dyspnea   . Dysrhythmia    diastolic dysfunction  . GERD (gastroesophageal reflux disease)   . Headache    migraines/ one per week  . 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 Hyperlipidemia   . Hypertension   . Motion sickness    boat  . Neuromuscular disorder (HCC)     neuropathy feet and hands( nerve damage)  . Wears dentures    upper and lower    Past Surgical History:  Procedure Laterality Date  . CARPAL TUNNEL RELEASE Bilateral    x2 right, 1x on left  . COLONOSCOPY    . COLONOSCOPY WITH PROPOFOL N/A 04/25/2017   Procedure: COLONOSCOPY WITH PROPOFOL;  Surgeon: [name], MD;  Location: [facility];  Service: Endoscopy;  Laterality: N/A;  diabetic-oral med  . DILATION AND CURETTAGE OF UTERUS    . EXTERNAL EAR SURGERY Left    x2  . POLYPECTOMY  04/25/2017   Procedure: POLYPECTOMY INTESTINAL;  Surgeon: [name], MD;  Location: [facility];  Service: Endoscopy;;  . SPINE SURGERY     herniated disc  . TUBAL LIGATION      Family Psychiatric History: I have reviewed family psychiatric history from my progress note on 10/13/2019  Family History:  Family History  Problem Relation Age of Onset  . Emphysema Mother   . Anxiety disorder Mother   . Stroke Father   . Throat cancer Father   . Multiple sclerosis Daughter   . Bipolar disorder Daughter   . Cervical cancer Daughter   . Bipolar disorder Daughter   . Drug abuse Daughter   . Lung cancer Maternal Aunt   . Lung cancer Maternal Uncle   . Lung cancer Maternal Grandmother   . Lung cancer Maternal Grandfather     Social History: Reviewed social history from my progress note on 10/13/2019 Social History   Socioeconomic History  . Marital status: Divorced    Spouse name: Not on file  . Number of children: 2  . Years of education: Not on file  . Highest education level: Associate degree: academic program  Occupational History  . Occupation: Disability  Tobacco Use  . Smoking status: Current Every Day Smoker    Packs/day: 1.00    Years: 41.00    Pack years: 41.00    Types: Cigarettes    Start date: 05/19/1977  . Smokeless tobacco: Never Used  Vaping Use  . Vaping Use: Former  Substance and Sexual Activity  . Alcohol use: No    Alcohol/week: 0.0 standard drinks  .  Drug use: No  . Sexual activity: Not Currently  Other Topics Concern  . Not on file  Social History Narrative   Lives with her boyfriend, she has two daughters.    Social Determinants of Health   Financial Resource Strain:   . Difficulty of Paying Living Expenses: Not on file  Food Insecurity:   . Worried About Charity fundraiser in the Last Year: Not on file  . Ran Out of Food in the Last Year: Not on file  Transportation Needs:   . Lack of Transportation (Medical): Not on file  . Lack of Transportation (Non-Medical): Not on file  Physical Activity:   . Days of Exercise per Week: Not on file  . Minutes of Exercise per Session:  Not on file  Stress:   . Feeling of Stress : Not on file  Social Connections: Unknown  . Frequency of Communication with Friends and Family: Not on file  . Frequency of Social Gatherings with Friends and Family: Not on file  . Attends Religious Services: Not on file  . Active Member of Clubs or Organizations: Not on file  . Attends Archivist Meetings: Not on file  . Marital Status: Living with partner    Allergies:  Allergies  Allergen Reactions  . Augmentin [Amoxicillin-Pot Clavulanate] Diarrhea  . Penicillins Itching    Metabolic Disorder Labs: Lab Results  Component Value Date   HGBA1C 6.9 (A) 12/07/2019   MPG 148 08/05/2019   MPG 146 08/27/2018   No results found for: PROLACTIN Lab Results  Component Value Date   CHOL 148 08/05/2019   TRIG 193 (H) 08/05/2019   HDL 39 (L) 08/05/2019   CHOLHDL 3.8 08/05/2019   VLDL 34 10/09/2012   LDLCALC 80 08/05/2019   LDLCALC 50 10/29/2017   Lab Results  Component Value Date   TSH 0.31 (L) 08/05/2019   TSH 1.380 10/04/2014    Therapeutic Level Labs: No results found for: LITHIUM No results found for: VALPROATE No components found for:  CBMZ  Current Medications: Current Outpatient Medications  Medication Sig Dispense Refill  . albuterol (VENTOLIN HFA) 108 (90 Base) MCG/ACT  inhaler INHALE 1 PUFF BY MOUTH AS NEEDED 18 g 1  . blood glucose meter kit and supplies KIT Dispense accuchek aviva plus; can change if needed e11.9 1 each 0  . budesonide-formoterol (SYMBICORT) 160-4.5 MCG/ACT inhaler USE 2 INHALATIONS INTO THE  LUNGS 2 TIMES DAILY    . busPIRone (BUSPAR) 7.5 MG tablet Take 7.5 mg by mouth 2 (two) times daily.     . carbidopa-levodopa (SINEMET IR) 25-100 MG tablet Take 1 tablet by mouth 3 (three) times daily.    . cetirizine (ZYRTEC) 10 MG tablet Take 10 mg by mouth as needed for allergies.     Marland Kitchen colchicine (COLCRYS) 0.6 MG tablet Take 1 tablet (0.6 mg total) by mouth daily. 30 tablet 0  . cyclobenzaprine (FLEXERIL) 10 MG tablet Take 10 mg by mouth 3 (three) times daily.     Marland Kitchen EMGALITY 120 MG/ML SOAJ Inject 1 mL into the skin every 30 (thirty) days.    Marland Kitchen FLUoxetine (PROZAC) 40 MG capsule Take 1 capsule (40 mg total) by mouth daily. 30 capsule 1  . furosemide (LASIX) 40 MG tablet Take 1 tablet (40 mg total) by mouth daily as needed. 30 tablet 2  . gabapentin (NEURONTIN) 600 MG tablet Take 1 tablet (600 mg total) by mouth 3 (three) times daily. 90 tablet 3  . glucose blood test strip Use as directed to check blood glucose daily 100 each 2  . HUMALOG KWIKPEN 100 UNIT/ML KwikPen INJECT SUBCUTANEOUSLY 5 TO  10 UNITS 3 TIMES DAILY 30 mL 2  . hydroCHLOROthiazide (MICROZIDE PO) hydrochlorothiazide    . hydrOXYzine (VISTARIL) 50 MG capsule TAKE 1 CAPSULE BY MOUTH TWICE DAILY AS NEEDED FOR SEVERE ANXIETY SYMPTOMS 60 capsule 1  . ibuprofen (ADVIL) 600 MG tablet Take by mouth.     . indomethacin (INDOCIN) 50 MG capsule indomethacin 50 mg capsule    . insulin glargine (LANTUS SOLOSTAR) 100 UNIT/ML Solostar Pen Inject 20 Units into the skin daily. 15 pen 1  . Insulin Pen Needle 32G X 6 MM MISC 2 each by Does not apply route daily. 200 each 2  .  ipratropium-albuterol (DUONEB) 0.5-2.5 (3) MG/3ML SOLN Inhale 3 mLs into the lungs every 6 (six) hours as needed. 360 mL 3  .  metFORMIN (GLUCOPHAGE-XR) 750 MG 24 hr tablet Take 2 tablets (1,500 mg total) by mouth daily with breakfast. 180 tablet 1  . montelukast (SINGULAIR) 10 MG tablet TAKE 1 TABLET BY MOUTH  DAILY IN THE AFTERNOON 90 tablet 2  . morphine (MS CONTIN) 30 MG 12 hr tablet morphine ER 30 mg tablet,extended release    . naproxen (NAPROSYN) 500 MG tablet Take 500 mg by mouth 2 (two) times daily.     Marland Kitchen omeprazole (PRILOSEC) 40 MG capsule Take 1 capsule (40 mg total) by mouth daily. 90 capsule 1  . Oxycodone HCl 10 MG TABS oxycodone 10 mg tablet    . Oxycodone HCl 10 MG TABS Take 10 mg by mouth 4 (four) times daily as needed.     . promethazine (PHENERGAN) 25 MG tablet     . RELISTOR 150 MG TABS     . rosuvastatin (CRESTOR) 5 MG tablet Take 1 tablet (5 mg total) by mouth at bedtime. 90 tablet 1  . theophylline (UNIPHYL) 400 MG 24 hr tablet Take 1 tablet by mouth daily.     Marland Kitchen tiotropium (SPIRIVA HANDIHALER) 18 MCG inhalation capsule Place 1 capsule into inhaler and inhale daily. pm    . topiramate (TOPAMAX) 100 MG tablet Take 1 tablet by mouth 2 (two) times daily.     . traZODone (DESYREL) 50 MG tablet TAKE 1 TABLET BY MOUTH AT  BEDTIME 90 tablet 1  . Ubrogepant (UBRELVY) 100 MG TABS Take by mouth.     No current facility-administered medications for this visit.     Musculoskeletal: Strength & Muscle Tone: UTA Gait & Station: UTA Patient leans: N/A  Psychiatric Specialty Exam: Review of Systems  Gastrointestinal: Positive for nausea and vomiting.  Psychiatric/Behavioral: Positive for dysphoric mood. The patient is nervous/anxious.   All other systems reviewed and are negative.   There were no vitals taken for this visit.There is no height or weight on file to calculate BMI.  General Appearance: UTA  Eye Contact:  UTA  Speech:  Clear and Coherent  Volume:  Normal  Mood:  Anxious and Depressed  Affect:  UTA  Thought Process:  Goal Directed and Descriptions of Associations: Intact  Orientation:   Full (Time, Place, and Person)  Thought Content: Logical   Suicidal Thoughts:  No  Homicidal Thoughts:  No  Memory:  Immediate;   Fair Recent;   Fair Remote;   Fair  Judgement:  Fair  Insight:  Fair  Psychomotor Activity:  UTA  Concentration:  Concentration: Fair and Attention Span: Fair  Recall:  [employer] of Knowledge: Fair  Language: Fair  Akathisia:  No  Handed:  Right  AIMS (if indicated): UTA  Assets:  Communication Skills Desire for Improvement Housing Social Support  ADL's:  Intact  Cognition: WNL  Sleep:  Fair   Screenings: GAD-7     [facility] Visit from 03/19/2018 in [facility] Office Visit from 01/30/2018 in [facility] Office Visit from 10/29/2017 in [facility]  Total GAD-7 Score 21 21 17     PHQ2-9     Office Visit from 12/07/2019 in [facility] Office Visit from 10/14/2019 in [facility] Office Visit from 08/05/2019 in [facility] Office Visit from 05/04/2019 in [facility] from 01/27/2019 in [facility]  [facility]  PHQ-2 Total Score 4 6 2 4 4   PHQ-9 Total Score 16 19 16 15 15        Assessment and Plan: Tricia [name] is a 59 year old Caucasian female on disability, lives in [place], has a history of MDD, GAD, degenerative disc disease, diastolic dysfunction, diabetes melitis, migraine headache, atypical Parkinson's disease was evaluated by phone today.  Patient is currently struggling with nausea and vomiting however reports it is chronic.  Hence it is unlikely a side effect of her current antidepressant.  She is also not interested in making any changes with her medications at this time.  Discussed plan as noted below.   Plan MDD-unstable Prozac 40 mg p.o. daily.  Advised patient to take her Prozac with food.  She wants to give the medication more time and does not believe her nausea and  vomiting is due to her Prozac. BuSpar 7.5 mg p.o. twice daily. Continue CBT with Ms. Zadie Rhine.  Advised patient to make use of more frequent psychotherapy sessions. Provided medication education, discussed long-term risk of being on benzodiazepines including drug to drug interaction with opiate pain medications.  GAD-unstable Prozac 40 mg p.o. daily BuSpar 7.5 mg p.o. twice daily Hydroxyzine 50 mg p.o. twice daily as needed Continue CBT  Tobacco use disorder-unstable We will monitor closely  Patient advised to reach out to primary care provider for her nausea and vomiting which according to her is chronic, but currently is going through a flareup. We will consider discontinuing Prozac if her nausea and vomiting are due to the medication.  This was discussed with patient.  She will Biochemist, clinical know.  I have spent atleast 20 minutes non face to face by video with patient today. More than 50 % of the time was spent for preparing to see the patient ( e.g., review of test, records ), ordering medications and test ,psychoeducation and supportive psychotherapy and care coordination,as well as documenting clinical information in electronic health record. This note was generated in part or whole with voice recognition software. Voice recognition is usually quite accurate but there are transcription errors that can and very often do occur. I apologize for any typographical errors that were not detected and corrected.      Ursula Alert, MD 01/18/2020, 11:34 AM

## 2020-01-25 DIAGNOSIS — G20 Parkinson's disease: Secondary | ICD-10-CM | POA: Diagnosis not present

## 2020-01-25 DIAGNOSIS — G629 Polyneuropathy, unspecified: Secondary | ICD-10-CM | POA: Diagnosis not present

## 2020-01-25 DIAGNOSIS — G250 Essential tremor: Secondary | ICD-10-CM | POA: Diagnosis not present

## 2020-01-25 DIAGNOSIS — G243 Spasmodic torticollis: Secondary | ICD-10-CM | POA: Diagnosis not present

## 2020-01-25 DIAGNOSIS — G43719 Chronic migraine without aura, intractable, without status migrainosus: Secondary | ICD-10-CM | POA: Diagnosis not present

## 2020-01-28 ENCOUNTER — Ambulatory Visit (INDEPENDENT_AMBULATORY_CARE_PROVIDER_SITE_OTHER): Payer: Medicare Other

## 2020-01-28 ENCOUNTER — Other Ambulatory Visit: Payer: Self-pay

## 2020-01-28 VITALS — BP 118/72 | HR 99 | Temp 98.3°F | Resp 16 | Ht 69.0 in | Wt 174.1 lb

## 2020-01-28 DIAGNOSIS — E1142 Type 2 diabetes mellitus with diabetic polyneuropathy: Secondary | ICD-10-CM | POA: Diagnosis not present

## 2020-01-28 DIAGNOSIS — Z0000 Encounter for general adult medical examination without abnormal findings: Secondary | ICD-10-CM | POA: Diagnosis not present

## 2020-01-28 DIAGNOSIS — I10 Essential (primary) hypertension: Secondary | ICD-10-CM | POA: Diagnosis not present

## 2020-01-28 DIAGNOSIS — I5032 Chronic diastolic (congestive) heart failure: Secondary | ICD-10-CM

## 2020-01-28 DIAGNOSIS — Z599 Problem related to housing and economic circumstances, unspecified: Secondary | ICD-10-CM

## 2020-01-28 DIAGNOSIS — J4540 Moderate persistent asthma, uncomplicated: Secondary | ICD-10-CM

## 2020-01-28 NOTE — Progress Notes (Signed)
Subjective:   Tricia [name] is a 59 y.o. female who presents for Medicare Annual (Subsequent) preventive examination.  Review of Systems     Cardiac Risk Factors include: advanced age (>68mn, >>57women);dyslipidemia;hypertension;diabetes mellitus;sedentary lifestyle;smoking/ tobacco exposure     Objective:    Today's Vitals   01/28/20 1410  BP: 118/72  Pulse: 99  Resp: 16  Temp: 98.3 F (36.8 C)  TempSrc: Oral  SpO2: 94%  Weight: 174 lb 1.6 oz (79 kg)  Height: 5' 9" (1.753 m)  PainSc: 8    Body mass index is 25.71 kg/m.  Advanced Directives 01/28/2020 09/18/2019 01/27/2019 01/23/2018 04/25/2017 01/01/2017 12/19/2016  Does Patient Have a Medical Advance Directive? _0  No No  Would patient like information on creating a medical advance directive? No - Patient declined - Yes (MAU/Ambulatory/Procedural Areas - Information given) Yes (MAU/Ambulatory/Procedural Areas - Information given) No - Patient declined Yes (MAU/Ambulatory/Procedural Areas - Information given) -    Current Medications (verified) Outpatient Encounter Medications as of 01/28/2020  Medication Sig  . albuterol (VENTOLIN HFA) 108 (90 Base) MCG/ACT inhaler INHALE 1 PUFF BY MOUTH AS NEEDED  . blood glucose meter kit and supplies KIT Dispense accuchek aviva plus; can change if needed e11.9  . budesonide-formoterol (SYMBICORT) 160-4.5 MCG/ACT inhaler USE 2 INHALATIONS INTO THE  LUNGS 2 TIMES DAILY  . busPIRone (BUSPAR) 7.5 MG tablet Take 7.5 mg by mouth 2 (two) times daily.   . carbidopa-levodopa (SINEMET IR) 25-100 MG tablet Take 1.5 tablets by mouth 3 (three) times daily.  . cetirizine (ZYRTEC) 10 MG tablet Take 10 mg by mouth as needed for allergies.   .Marland Kitchencolchicine (COLCRYS) 0.6 MG tablet Take 1 tablet (0.6 mg total) by mouth daily.  . cyclobenzaprine (FLEXERIL) 10 MG tablet Take 10 mg by mouth 3 (three) times daily.   .Marland KitchenFLUoxetine (PROZAC) 40 MG capsule Take 1 capsule (40 mg total) by mouth  daily.  . furosemide (LASIX) 40 MG tablet Take 1 tablet (40 mg total) by mouth daily as needed.  . gabapentin (NEURONTIN) 600 MG tablet Take 1 tablet (600 mg total) by mouth 3 (three) times daily.  .Marland Kitchenglucose blood test strip Use as directed to check blood glucose daily  . HUMALOG KWIKPEN 100 UNIT/ML KwikPen INJECT SUBCUTANEOUSLY 5 TO  10 UNITS 3 TIMES DAILY  . hydrOXYzine (VISTARIL) 50 MG capsule TAKE 1 CAPSULE BY MOUTH TWICE DAILY AS NEEDED FOR SEVERE ANXIETY SYMPTOMS  . ibuprofen (ADVIL) 600 MG tablet Take by mouth.   . indomethacin (INDOCIN) 50 MG capsule indomethacin 50 mg capsule  . insulin glargine (LANTUS SOLOSTAR) 100 UNIT/ML Solostar Pen Inject 20 Units into the skin daily.  . Insulin Pen Needle 32G X 6 MM MISC 2 each by Does not apply route daily.  .Marland Kitchenipratropium-albuterol (DUONEB) 0.5-2.5 (3) MG/3ML SOLN Inhale 3 mLs into the lungs every 6 (six) hours as needed.  . metFORMIN (GLUCOPHAGE-XR) 750 MG 24 hr tablet Take 2 tablets (1,500 mg total) by mouth daily with breakfast.  . montelukast (SINGULAIR) 10 MG tablet TAKE 1 TABLET BY MOUTH  DAILY IN THE AFTERNOON  . morphine (MS CONTIN) 30 MG 12 hr tablet morphine ER 30 mg tablet,extended release  . naproxen (NAPROSYN) 500 MG tablet Take 500 mg by mouth 2 (two) times daily.   .Marland Kitchenomeprazole (PRILOSEC) 40 MG capsule Take 1 capsule (40 mg total) by mouth daily.  . Oxycodone HCl 10 MG TABS Take 10 mg by mouth 4 (four) times  daily as needed.   . promethazine (PHENERGAN) 25 MG tablet   . RELISTOR 150 MG TABS   . rosuvastatin (CRESTOR) 5 MG tablet Take 1 tablet (5 mg total) by mouth at bedtime.  . theophylline (UNIPHYL) 400 MG 24 hr tablet Take 1 tablet by mouth daily.   Marland Kitchen tiotropium (SPIRIVA) 18 MCG inhalation capsule Place 1 capsule into inhaler and inhale daily. pm  . traZODone (DESYREL) 50 MG tablet TAKE 1 TABLET BY MOUTH AT  BEDTIME  . Ubrogepant (UBRELVY) 100 MG TABS Take by mouth.  . Vitamin D, Ergocalciferol, (DRISDOL) 1.25 MG (50000  UNIT) CAPS capsule Take 50,000 Units by mouth once a week.  . topiramate (TOPAMAX) 100 MG tablet Take 1 tablet by mouth 2 (two) times daily.  (Patient not taking: Reported on 01/28/2020)  . [DISCONTINUED] carbidopa-levodopa (SINEMET IR) 25-100 MG tablet Take 1 tablet by mouth 3 (three) times daily.  . [DISCONTINUED] EMGALITY 120 MG/ML SOAJ Inject 1 mL into the skin every 30 (thirty) days.  . [DISCONTINUED] hydroCHLOROthiazide (MICROZIDE PO) hydrochlorothiazide  . [DISCONTINUED] Oxycodone HCl 10 MG TABS oxycodone 10 mg tablet   No facility-administered encounter medications on file as of 01/28/2020.    Allergies (verified) Augmentin [amoxicillin-pot clavulanate] and Penicillins   History: Past Medical History:  Diagnosis Date  . Anxiety   . Arthritis    joints and hands/ knees  . Asthma    uses inhaler  . Benign essential tremor    head  . Cervical dystonia    neck pain  . Cholesteatoma of left ear    x2  . COPD (chronic obstructive pulmonary disease) ([place])   . Cough   . Depression   . Diabetes mellitus without complication ([place])    type 2  . Diastolic dysfunction   . Dyspnea   . Dysrhythmia    diastolic dysfunction  . GERD (gastroesophageal reflux disease)   . Headache    migraines/ one per week  . 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 Hyperlipidemia   . Hypertension   . Motion sickness    boat  . Neuromuscular disorder (HCC)    neuropathy feet and hands( nerve damage)  . Wears dentures    upper and lower   Past Surgical History:  Procedure Laterality Date  . CARPAL TUNNEL RELEASE Bilateral    x2 right, 1x on left  . COLONOSCOPY    . COLONOSCOPY WITH PROPOFOL N/A 04/25/2017   Procedure: COLONOSCOPY WITH PROPOFOL;  Surgeon: [name], MD;  Location: [facility];  Service: Endoscopy;  Laterality: N/A;  diabetic-oral med  . DILATION AND CURETTAGE OF UTERUS    . EXTERNAL EAR SURGERY Left    x2  . POLYPECTOMY  04/25/2017   Procedure:  POLYPECTOMY INTESTINAL;  Surgeon: [name], MD;  Location: [facility];  Service: Endoscopy;;  . SPINE SURGERY     herniated disc  . TUBAL LIGATION     Family History  Problem Relation Age of Onset  . Emphysema Mother   . Anxiety disorder Mother   . Stroke Father   . Throat cancer Father   . Multiple sclerosis Daughter   . Bipolar disorder Daughter   . Cervical cancer Daughter   . Bipolar disorder Daughter   . Drug abuse Daughter   . Lung cancer Maternal Aunt   . Lung cancer Maternal Uncle   . Lung cancer Maternal Grandmother   . Lung cancer Maternal Grandfather    Social  History   Socioeconomic History  . Marital status: Divorced    Spouse name: Not on file  . Number of children: 2  . Years of education: Not on file  . Highest education level: Associate degree: academic program  Occupational History  . Occupation: Disability  Tobacco Use  . Smoking status: Current Every Day Smoker    Packs/day: 1.00    Years: 41.00    Pack years: 41.00    Types: Cigarettes    Start date: 05/19/1977  . Smokeless tobacco: Never Used  Vaping Use  . Vaping Use: Former  Substance and Sexual Activity  . Alcohol use: No    Alcohol/week: 0.0 standard drinks  . Drug use: No  . Sexual activity: Not Currently  Other Topics Concern  . Not on file  Social History Narrative   Lives with her boyfriend, she has two daughters.    Social Determinants of Health   Financial Resource Strain: High Risk  . Difficulty of Paying Living Expenses: Very hard  Food Insecurity: No Food Insecurity  . Worried About Charity fundraiser in the Last Year: Never true  . Ran Out of Food in the Last Year: Never true  Transportation Needs: No Transportation Needs  . Lack of Transportation (Medical): No  . Lack of Transportation (Non-Medical): No  Physical Activity: Inactive  . Days of Exercise per Week: 0 days  . Minutes of Exercise per Session: 0 min  Stress: Stress Concern Present  . Feeling  of Stress : Rather much  Social Connections: Moderately Isolated  . Frequency of Communication with Friends and Family: More than three times a week  . Frequency of Social Gatherings with Friends and Family: Once a week  . Attends Religious Services: Never  . Active Member of Clubs or Organizations: No  . Attends Archivist Meetings: Never  . Marital Status: Living with partner    Tobacco Counseling Ready to quit: Not Answered Counseling given: Not Answered   Clinical Intake:  Pre-visit preparation completed: No  Pain : 0-10 Pain Score: 8  Pain Type: Chronic pain Pain Location: Generalized (back, right shoulder, knees) Pain Descriptors / Indicators: Aching,Discomfort,Sore Pain Onset: More than a month ago Pain Frequency: Constant     BMI - recorded: 25.71 Nutritional Status: BMI 25 -29 Overweight Nutritional Risks: None Diabetes: Yes CBG done?: No Did pt. bring in CBG monitor from home?: No  How often do you need to have someone help you when you read instructions, pamphlets, or other written materials from your doctor or pharmacy?: 1 - Never  Nutrition Risk Assessment:  Has the patient had any N/V/D within the last 2 months?  Yes  Does the patient have any non-healing wounds?  No  Has the patient had any unintentional weight loss or weight gain?  No   Diabetes:  Is the patient diabetic?  Yes  If diabetic, was a CBG obtained today?  No  Did the patient bring in their glucometer from home?  No  How often do you monitor your CBG's? As needed per patient.   Financial Strains and Diabetes Management:  Are you having any financial strains with the device, your supplies or your medication? Yes .  Does the patient want to be seen by Chronic Care Management for management of their diabetes?  Yes  Would the patient like to be referred to a Nutritionist or for Diabetic Management?  No   Diabetic Exams:  Diabetic Eye Exam: Completed 02/27/19 negative  retinopathy;  [facility].  Diabetic Foot Exam: Completed 10/14/19.   Interpreter Needed?: No  Information entered by :: Clemetine Marker LPN   Activities of Daily Living In your present state of health, do you have any difficulty performing the following activities: 01/28/2020 12/07/2019  Hearing? Y Y  Comment unable to afford hearing aids -  Vision? Y Y  Difficulty concentrating or making decisions? Y Y  Comment - -  Walking or climbing stairs? N N  Dressing or bathing? N N  Doing errands, shopping? N N  Preparing Food and eating ? N -  Using the Toilet? N -  In the past six months, have you accidently leaked urine? Y -  Comment wears pads for protection -  Do you have problems with loss of bowel control? N -  Managing your Medications? N -  Managing your Finances? N -  Housekeeping or managing your Housekeeping? N -  Some recent data might be hidden    Patient Care Team: [name], MD as PCP - General (Family Medicine) Erby Pian, MD as Consulting Physician (Specialist) [name], MD as Consulting Physician (Neurology) Ubaldo Glassing Javier Docker, MD as Consulting Physician (Cardiology) Lonia Farber, MD as Consulting Physician (Internal Medicine)  Indicate any recent Medical Services you may have received from other than [facility] providers in the past year (date may be approximate).     Assessment:   This is a routine wellness examination for [place].  Hearing/Vision screen  Hearing Screening   125Hz 250Hz 500Hz 1000Hz 2000Hz 3000Hz 4000Hz 6000Hz 8000Hz  Right ear:           Left ear:           Comments: Pt is partially deaf in left ear; needs hearing aids but cannot afford them.   Vision Screening Comments: [facility]   Dietary issues and exercise activities discussed: Current Exercise Habits: The patient does not participate in regular exercise at present, Exercise limited by: orthopedic  condition(s)  Goals    .  "I guess I need to get better control of my diabetes" (pt-stated)      Current Barriers:  Marland Kitchen Knowledge Deficits related to basic Diabetes pathophysiology and self care/management . Knowledge Deficits related to medications used for management of diabetes . Difficulty obtaining or cannot afford medications . Limited Social Support  Case Manager Clinical Goal(s):  Over the next 30 days, patient will demonstrate improved adherence to prescribed treatment plan for diabetes self care/management as evidenced by:  . daily monitoring and recording of CBG  . adherence to prescribed medication regimen   Over the next 90 days, patient will demonstrate improved adherence to prescribed treatment plan as evidenced by: lowered A1c  Interventions:  . Reviewed medications with patient and discussed importance of medication adherence . Advised patient, providing education and rationale, to check cbg daily and record, calling Dr. [name] or pharmacist for findings outside established parameters.    Patient Self Care Activities:  . Self administers oral medications as prescribed . Self administers insulin as prescribed . Checks blood sugars as prescribed and utilize hyper and hypoglycemia protocol as needed  Initial goal documentation       .  "I need help straightening out my medicine" (pt-stated)      Patient reports recent BG readings in the 200s in the past 10 days, with an average of 220 mg/dL. We reviewed her goal and her DM medications. Adherence review of fill history  reflects non adherence to rosuvastatin. Patient states she currently has about 2 months supply of her maintenance medicine (refilled one month ago). Refill history reflects this is correct, patient is consistent with fills per history but is not filling everything on her medication list.   Current Barriers:  Marland Kitchen Knowledge Deficits related to medication regimen . Financial Barriers  Updated Pharmacist  Clinical Goal(s):  Marland Kitchen Over the next 30 days, patient will demonstrate Improved medication adherence as evidenced by FBG <120 mg/dL  . Over the next 30 days, patient will collaborate with CCM pharmacist to complete [employer] medication assistance application for Spiriva  Interventions: . Collaboration with provider re: medication management  . Medication assistance application for Spiriva Partially completed 05/22/18: Medication assistance for [employer] and [employer] have been approved until 02/19/19   Patient Self Care Activities:  . Calls provider 10 days prior to needing refills on insulins or Dulera so that time is allowed for medication assistance program to ship medications  Please see past updates related to this goal by clicking on the "Past Updates" button in the selected goal       .  "I want to make sure I'm managing my heart failure right" (pt-stated)       Clinical Goals: Over then next 7 days, patient will report understanding plan of care related to HF self monitoring/self care  Interventions: HF education reinforced including importance of daily weights, reporting weight gain of 3lb over night or 5lb in a week to provider, and following a low NA diet.  02/25/2018 Reinforced HF education and low sodium diet adherence  03/13/2018 Discussed sodium intake and role in lower extremity swelling. Encouraged elevation of extremities and taking diuretics as prescribed.     .  I have questions about the COVID vaccine (pt-stated)      Current Barriers:  Marland Kitchen Knowledge Deficits related to COVID-19 vaccine and impact on patient self health management  Clinical Goal(s):  Marland Kitchen Over the next 30 days, patient will verbalize basic understanding of COVID-19 vaccines available Glass blower/designer) as evidenced by verbalization of basic understanding of vaccine efficacy, two dose schedule, how to self-schedule a vaccine, and most common side effects    Interventions: . Counseled on the [employer] and  [place] vaccine is 95% effective and consists of 2 doses, 3 weeks apart o Moderna vaccine is 94.5% effective and consists of 2 doses, 4 weeks apart o Must have both doses from the same manufacturer o Scientists believe the vaccine will be effective against the new, highly contagious vaccines; [place] has announced some preliminary data supporting this  o Common side effects are sore arm at the injection site, mild fever and fatigue for 24-48 hours - Side effects more common after second dose . Provided information on how to schedule vaccine and register with state of [place] . Continue to wear a mask, wash hands frequently, maintain a distance of 6 feet from others  Patient Self Care Activities:  . Patient verbalizes understanding of plan to receive COVID19 immunization . Calls provider office for new concerns or questions . Schedules self for vaccine when able to based on [place] guidelines  Initial goal documentation      .  Medication assistance 2021 (pt-stated)      Current Barriers:  . financial  Pharmacist Clinical Goal(s): Over the next 14 days, Ms.Marvel Plan will provide the necessary supplementary documents (proof of out of pocket prescription expenditure, proof of household income) needed for medication  assistance applications to CCM pharmacist.   Interventions: . CCM pharmacist will apply for medication assistance program for patient's insulin via [employer] . Updated 1/13: reviewed necessary documents for completed application  Patient Self Care Activities:  Marland Kitchen Gather necessary documents needed to apply for medication assistance  Please see past updates related to this goal by clicking on the "Past Updates" button in the selected goal        Depression Screen PHQ 2/9 Scores 01/28/2020 12/07/2019 10/14/2019 08/05/2019 05/04/2019 01/27/2019 01/22/2019  PHQ - 2 Score _0 PHQ- 9 Score _1 Fall Risk Fall Risk  01/28/2020 12/07/2019  10/14/2019 08/05/2019 05/04/2019  Falls in the past year? _2 0 1  Number falls in past yr: _3 0 1  Injury with Fall? 0 1 1 0 0  Comment - - - - -  Risk for fall due to : History of fall(s);Orthopedic patient History of fall(s) - - -  Risk for fall due to: Comment - - - - -  Follow up - - - - -    FALL RISK PREVENTION PERTAINING TO THE HOME:  Any stairs in or around the home? No  If so, are there any without handrails? No  Home free of loose throw rugs in walkways, pet beds, electrical cords, etc? Yes  Adequate lighting in your home to reduce risk of falls? Yes   ASSISTIVE DEVICES UTILIZED TO PREVENT FALLS:  Life alert? No  Use of a cane, walker or w/c? No  Grab bars in the bathroom? No  Shower chair or bench in shower? Yes  Elevated toilet seat or a handicapped toilet? Yes   TIMED UP AND GO:  Was the test performed? Yes .  Length of time to ambulate 10 feet: 5 sec.   Gait steady and fast without use of assistive device  Cognitive Function:     6CIT Screen 01/28/2020 01/27/2019 01/23/2018 01/01/2017  What Year? 0 points 0 points 0 points 0 points  What month? 0 points 0 points 0 points 0 points  What time? 0 points 0 points 0 points 0 points  Count back from 20 0 points 0 points 0 points 0 points  Months in reverse 0 points 0 points 0 points 0 points  Repeat phrase 0 points 0 points 0 points 0 points  Total Score 0 0 0 0    Immunizations Immunization History  Administered Date(s) Administered  . Influenza,inj,Quad PF,6+ Mos 01/24/2015, 12/19/2016, 10/29/2017, 10/14/2019  . Influenza-Unspecified 12/09/2015  . PFIZER SARS-COV-2 Vaccination 05/15/2019, 06/09/2019  . Pneumococcal Polysaccharide-23 02/19/2009  . Tdap 12/09/2012    TDAP status: Up to date  Flu Vaccine status: Up to date  Pneumococcal vaccine status: Up to date  Covid-19 vaccine status: Completed vaccines  Qualifies for Shingles Vaccine? Yes   Zostavax completed No   Shingrix Completed?: No.     Education has been provided regarding the importance of this vaccine. Patient has been advised to call insurance company to determine out of pocket expense if they have not yet received this vaccine. Advised may also receive vaccine at local pharmacy or Health Dept. Verbalized acceptance and understanding.  Screening Tests Health Maintenance  Topic Date Due  . MAMMOGRAM  Never done  . COVID-19 Vaccine (3 - Booster for Pfizer series) 12/09/2019  . PAP SMEAR-Modifier  03/22/2020 (Originally 09/18/1981)  . OPHTHALMOLOGY EXAM  02/27/2020  . HEMOGLOBIN A1C  06/06/2020  . URINE MICROALBUMIN  08/04/2020  . FOOT EXAM  10/13/2020  . TETANUS/TDAP  12/10/2022  . COLONOSCOPY  04/26/2027  . INFLUENZA VACCINE  Completed  . PNEUMOCOCCAL POLYSACCHARIDE VACCINE AGE 72-64 HIGH RISK  Completed  . Hepatitis C Screening  Completed  . HIV Screening  Completed    Health Maintenance  Health Maintenance Due  Topic Date Due  . MAMMOGRAM  Never done  . COVID-19 Vaccine (3 - Booster for Pfizer series) 12/09/2019    Colorectal cancer screening: Type of screening: Colonoscopy. Completed 04/25/17 . Repeat every 10 years  Mammogram status: Ordered 08/05/19. Pt provided with contact info and advised to call to schedule appt.   Pt declines repeat screening.   Bone density screening status: due at age 20  Lung Cancer Screening: (Low Dose CT Chest recommended if Age 39-80 years, 30 pack-year currently smoking OR have quit w/in 15years.) does qualify. Completed 03/17/19.  Additional Screening:  Hepatitis C Screening: does qualify; Completed 08/27/18  Vision Screening: Recommended annual ophthalmology exams for early detection of glaucoma and other disorders of the eye. Is the patient up to date with their annual eye exam?  Yes  Who is the provider or what is the name of the office in which the patient attends annual eye exams? [place] Screening: Recommended annual dental exams for proper oral  hygiene  Community Resource Referral / Chronic Care Management: CRR required this visit?  Yes  CCM required this visit?  Yes     Plan:     I have personally reviewed and noted the following in the patient's chart:   . Medical and social history . Use of alcohol, tobacco or illicit drugs  . Current medications and supplements . Functional ability and status . Nutritional status . Physical activity . Advanced directives . List of other physicians . Hospitalizations, surgeries, and ER visits in previous 12 months . Vitals . Screenings to include cognitive, depression, and falls . Referrals and appointments  In addition, I have reviewed and discussed with patient certain preventive protocols, quality metrics, and best practice recommendations. A written personalized care plan for preventive services as well as general preventive health recommendations were provided to patient.     Clemetine Marker, LPN   [id number]   Nurse Notes: pt being referred to C3 team for community resources and CCM for care coordination with nursing and pharmacy.

## 2020-01-28 NOTE — Patient Instructions (Signed)
Tricia [name] , Thank you for taking time to come for your Medicare Wellness Visit. I appreciate your ongoing commitment to your health goals. Please review the following plan we discussed and let me know if I can assist you in the future.   Screening recommendations/referrals: Colonoscopy: done 04/25/17 Mammogram: due. Please call [phone] to schedule your mammogram.  Bone Density: due age 59 Recommended yearly ophthalmology/optometry visit for glaucoma screening and checkup Recommended yearly dental visit for hygiene and checkup  Vaccinations: Influenza vaccine: done 10/14/19 Pneumococcal vaccine: done 2011 Tdap vaccine: done 12/09/12 Shingles vaccine: Shingrix discussed. Please contact your pharmacy for coverage information.  Covid-19: done 05/15/19 & 06/09/19  Advanced directives: Advance directive discussed with you today. I have provided a copy for you to complete at home and have notarized. Once this is complete please bring a copy in to our office so we can scan it into your chart.  Conditions/risks identified: Recommend increasing physical activity as tolerated.   Next appointment: Follow up in one year for your annual wellness visit.   Preventive Care 40-64 Years, Female Preventive care refers to lifestyle choices and visits with your health care provider that can promote health and wellness. What does preventive care include?  A yearly physical exam. This is also called an annual well check.  Dental exams once or twice a year.  Routine eye exams. Ask your health care provider how often you should have your eyes checked.  Personal lifestyle choices, including:  Daily care of your teeth and gums.  Regular physical activity.  Eating a healthy diet.  Avoiding tobacco and drug use.  Limiting alcohol use.  Practicing safe sex.  Taking low-dose aspirin daily starting at age 25.  Taking vitamin and mineral supplements as recommended by your health care  provider. What happens during an annual well check? The services and screenings done by your health care provider during your annual well check will depend on your age, overall health, lifestyle risk factors, and family history of disease. Counseling  Your health care provider may ask you questions about your:  Alcohol use.  Tobacco use.  Drug use.  Emotional well-being.  Home and relationship well-being.  Sexual activity.  Eating habits.  Work and work Statistician.  Method of birth control.  Menstrual cycle.  Pregnancy history. Screening  You may have the following tests or measurements:  Height, weight, and BMI.  Blood pressure.  Lipid and cholesterol levels. These may be checked every 5 years, or more frequently if you are over 39 years old.  Skin check.  Lung cancer screening. You may have this screening every year starting at age 9 if you have a 30-pack-year history of smoking and currently smoke or have quit within the past 15 years.  Fecal occult blood test (FOBT) of the stool. You may have this test every year starting at age 89.  Flexible sigmoidoscopy or colonoscopy. You may have a sigmoidoscopy every 5 years or a colonoscopy every 10 years starting at age 44.  Hepatitis C blood test.  Hepatitis B blood test.  Sexually transmitted disease (STD) testing.  Diabetes screening. This is done by checking your blood sugar (glucose) after you have not eaten for a while (fasting). You may have this done every 1-3 years.  Mammogram. This may be done every 1-2 years. Talk to your health care provider about when you should start having regular mammograms. This may depend on whether you have a family history of breast cancer.  BRCA-related cancer screening.  This may be done if you have a family history of breast, ovarian, tubal, or peritoneal cancers.  Pelvic exam and Pap test. This may be done every 3 years starting at age 31. Starting at age 35, this may be  done every 5 years if you have a Pap test in combination with an HPV test.  Bone density scan. This is done to screen for osteoporosis. You may have this scan if you are at high risk for osteoporosis. Discuss your test results, treatment options, and if necessary, the need for more tests with your health care provider. Vaccines  Your health care provider may recommend certain vaccines, such as:  Influenza vaccine. This is recommended every year.  Tetanus, diphtheria, and acellular pertussis (Tdap, Td) vaccine. You may need a Td booster every 10 years.  Zoster vaccine. You may need this after age 21.  Pneumococcal 13-valent conjugate (PCV13) vaccine. You may need this if you have certain conditions and were not previously vaccinated.  Pneumococcal polysaccharide (PPSV23) vaccine. You may need one or two doses if you smoke cigarettes or if you have certain conditions. Talk to your health care provider about which screenings and vaccines you need and how often you need them. This information is not intended to replace advice given to you by your health care provider. Make sure you discuss any questions you have with your health care provider. Document Released: 03/04/2015 Document Revised: 10/26/2015 Document Reviewed: 12/07/2014 Elsevier Interactive Patient Education  2017 [employer] Prevention in the Home Falls can cause injuries. They can happen to people of all ages. There are many things you can do to make your home safe and to help prevent falls. What can I do on the outside of my home?  Regularly fix the edges of walkways and driveways and fix any cracks.  Remove anything that might make you trip as you walk through a door, such as a raised step or threshold.  Trim any bushes or trees on the path to your home.  Use bright outdoor lighting.  Clear any walking paths of anything that might make someone trip, such as rocks or tools.  Regularly check to see if  handrails are loose or broken. Make sure that both sides of any steps have handrails.  Any raised decks and porches should have guardrails on the edges.  Have any leaves, snow, or ice cleared regularly.  Use sand or salt on walking paths during winter.  Clean up any spills in your garage right away. This includes oil or grease spills. What can I do in the bathroom?  Use night lights.  Install grab bars by the toilet and in the tub and shower. Do not use towel bars as grab bars.  Use non-skid mats or decals in the tub or shower.  If you need to sit down in the shower, use a plastic, non-slip stool.  Keep the floor dry. Clean up any water that spills on the floor as soon as it happens.  Remove soap buildup in the tub or shower regularly.  Attach bath mats securely with double-sided non-slip rug tape.  Do not have throw rugs and other things on the floor that can make you trip. What can I do in the bedroom?  Use night lights.  Make sure that you have a light by your bed that is easy to reach.  Do not use any sheets or blankets that are too big for your bed. They should not hang  down onto the floor.  Have a firm chair that has side arms. You can use this for support while you get dressed.  Do not have throw rugs and other things on the floor that can make you trip. What can I do in the kitchen?  Clean up any spills right away.  Avoid walking on wet floors.  Keep items that you use a lot in easy-to-reach places.  If you need to reach something above you, use a strong step stool that has a grab bar.  Keep electrical cords out of the way.  Do not use floor polish or wax that makes floors slippery. If you must use wax, use non-skid floor wax.  Do not have throw rugs and other things on the floor that can make you trip. What can I do with my stairs?  Do not leave any items on the stairs.  Make sure that there are handrails on both sides of the stairs and use them. Fix  handrails that are broken or loose. Make sure that handrails are as long as the stairways.  Check any carpeting to make sure that it is firmly attached to the stairs. Fix any carpet that is loose or worn.  Avoid having throw rugs at the top or bottom of the stairs. If you do have throw rugs, attach them to the floor with carpet tape.  Make sure that you have a light switch at the top of the stairs and the bottom of the stairs. If you do not have them, ask someone to add them for you. What else can I do to help prevent falls?  Wear shoes that:  Do not have high heels.  Have rubber bottoms.  Are comfortable and fit you well.  Are closed at the toe. Do not wear sandals.  If you use a stepladder:  Make sure that it is fully opened. Do not climb a closed stepladder.  Make sure that both sides of the stepladder are locked into place.  Ask someone to hold it for you, if possible.  Clearly mark and make sure that you can see:  Any grab bars or handrails.  First and last steps.  Where the edge of each step is.  Use tools that help you move around (mobility aids) if they are needed. These include:  Canes.  Walkers.  Scooters.  Crutches.  Turn on the lights when you go into a dark area. Replace any light bulbs as soon as they burn out.  Set up your furniture so you have a clear path. Avoid moving your furniture around.  If any of your floors are uneven, fix them.  If there are any pets around you, be aware of where they are.  Review your medicines with your doctor. Some medicines can make you feel dizzy. This can increase your chance of falling. Ask your doctor what other things that you can do to help prevent falls. This information is not intended to replace advice given to you by your health care provider. Make sure you discuss any questions you have with your health care provider. Document Released: 12/02/2008 Document Revised: 07/14/2015 Document Reviewed:  03/12/2014 Elsevier Interactive Patient Education  2017 [employer].

## 2020-01-29 ENCOUNTER — Telehealth: Payer: Self-pay | Admitting: *Deleted

## 2020-01-29 NOTE — Chronic Care Management (AMB) (Signed)
  Chronic Care Management   Note  01/29/2020 Name: Tricia [name] MRN: [id number] DOB: 01-14-1961  Tricia [name] is a 59 y.o. year old female who is a primary care patient of [name], MD. I reached out to [name] by phone today in response to a referral sent by Tricia [name]'s patient's AWV (annual wellness visit) nurse, Clemetine Marker LPN..      Tricia [name] was given information about Chronic Care Management services today including:  1. CCM service includes personalized support from designated clinical staff supervised by her physician, including individualized plan of care and coordination with other care providers 2. 24/7 contact phone numbers for assistance for urgent and routine care needs. 3. Service will only be billed when office clinical staff spend 20 minutes or more in a month to coordinate care. 4. Only one practitioner may furnish and bill the service in a calendar month. 5. The patient may stop CCM services at any time (effective at the end of the month) by phone call to the office staff. 6. The patient will be responsible for cost sharing (co-pay) of up to 20% of the service fee (after annual deductible is met).  Patient agreed to services and verbal consent obtained.   Follow up plan: Telephone appointment with care management team member scheduled for: RN CM 02/15/2020 Telephone appointment with care management team member scheduled for: PharmD 03/02/2020   [facility]  Direct Dial [phone]

## 2020-02-02 DIAGNOSIS — G894 Chronic pain syndrome: Secondary | ICD-10-CM | POA: Diagnosis not present

## 2020-02-02 DIAGNOSIS — M5416 Radiculopathy, lumbar region: Secondary | ICD-10-CM | POA: Diagnosis not present

## 2020-02-02 DIAGNOSIS — Z79891 Long term (current) use of opiate analgesic: Secondary | ICD-10-CM | POA: Diagnosis not present

## 2020-02-02 DIAGNOSIS — M25569 Pain in unspecified knee: Secondary | ICD-10-CM | POA: Diagnosis not present

## 2020-02-02 DIAGNOSIS — M542 Cervicalgia: Secondary | ICD-10-CM | POA: Diagnosis not present

## 2020-02-02 DIAGNOSIS — Z79899 Other long term (current) drug therapy: Secondary | ICD-10-CM | POA: Diagnosis not present

## 2020-02-05 ENCOUNTER — Telehealth: Payer: Self-pay | Admitting: Family Medicine

## 2020-02-05 NOTE — Telephone Encounter (Signed)
   [facility] 02/05/2020   Name: Tricia [name]   MRN: [id number]   DOB: 03-26-1960   AGE: 59 y.o.   GENDER: female   PCP [name], MD.   Referral Reason: Assistance with applying for Medicaid  Interventions: Successful outbound call placed to the patient Patient stated she would like to apply for Medicaid as a secondary insurance. She stated that she has applied before. Informed paient that the best form of action would be for her to give [facility] a call at [phone] and speak withthe Intake Specialist to get her application process started. Patient stated understanding. Also asked patient if she still needs assitance with medications. Patient stated that she currently has Medicare Extra Help for medication assistance.   Follow up plan: Care guide will follow up with patient by phone over the next week.      [place], Care Management Phone: [phone] Email: [email or web address]

## 2020-02-15 ENCOUNTER — Telehealth: Payer: Medicare Other

## 2020-02-17 NOTE — Telephone Encounter (Signed)
Referral Reason: Assistance with Medicaid  Interventions: Successful outbound call placed to the patient Patient stated she has not had time to give [facility] a call, but will give them a call after the holidays. Informed patient that if she needs any addtional assistance to give the office a call. No addtional needs at this time.   Follow up plan: No further follow up planned at this time. The patient has been provided with needed resources.

## 2020-02-19 ENCOUNTER — Ambulatory Visit
Admission: EM | Admit: 2020-02-19 | Discharge: 2020-02-19 | Disposition: A | Discharge status: Home / Self Care | Payer: Medicare Other | Attending: Family Medicine | Admitting: Family Medicine

## 2020-02-19 ENCOUNTER — Other Ambulatory Visit: Payer: Self-pay

## 2020-02-19 DIAGNOSIS — M109 Gout, unspecified: Secondary | ICD-10-CM | POA: Diagnosis not present

## 2020-02-19 DIAGNOSIS — M25531 Pain in right wrist: Secondary | ICD-10-CM

## 2020-02-19 MED ORDER — PREDNISONE 10 MG PO TABS: ORAL_TABLET | ORAL | 0 refills | Status: DC

## 2020-02-19 NOTE — ED Triage Notes (Signed)
Pt is here with right hand pain that started yesterday, pt has taken er prescribed pain meds to relieve discomfort.

## 2020-02-19 NOTE — Discharge Instructions (Signed)
Medication as prescribed.  Take care  Dr. Sury Wentworth  

## 2020-02-20 NOTE — ED Provider Notes (Signed)
[facility]    CSN: [id number] Arrival date & time: 02/19/20  1726  History   Chief Complaint Chief Complaint  Patient presents with  . Hand Pain   HPI  60 year old female presents with right wrist pain.  Patient has a history of chronic pain.  She has known gout.  She reports right wrist pain which started yesterday.  Severe.  She states that her home pain medications are not touching her pain.  She denies fall, trauma, injury.  No relieving factors.  No other reported symptoms.  No other complaints.  Past Medical History:  Diagnosis Date  . Anxiety   . Arthritis    joints and hands/ knees  . Asthma    uses inhaler  . Benign essential tremor    head  . Cervical dystonia    neck pain  . Cholesteatoma of left ear    x2  . COPD (chronic obstructive pulmonary disease) ([place])   . Cough   . Depression   . Diabetes mellitus without complication ([place])    type 2  . Diastolic dysfunction   . Dyspnea   . Dysrhythmia    diastolic dysfunction  . GERD (gastroesophageal reflux disease)   . Headache    migraines/ one per week  . 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 Hyperlipidemia   . Hypertension   . Motion sickness    boat  . Neuromuscular disorder (HCC)    neuropathy feet and hands( nerve damage)  . Wears dentures    upper and lower    Patient Active Problem List   Diagnosis Date Noted  . History of prolonged Q-T interval on ECG 11/18/2019  . GAD (generalized anxiety disorder) 10/13/2019  . MDD (major depressive disorder), recurrent episode, moderate ([place]) 10/13/2019  . At risk for long QT syndrome 10/13/2019  . Nonrheumatic mitral valve regurgitation 05/04/2019  . Benign neoplasm of descending colon   . Polyp of sigmoid colon   . 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 Polyneuropathy 10/21/2014  . Chronic venous insufficiency 10/05/2014  . Bilateral leg edema 08/16/2014  . Major depression in partial remission ([place]) 08/16/2014   . Acid reflux 08/16/2014  . 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 Asthma, moderate persistent 08/16/2014  . Carpal tunnel syndrome 08/16/2014  . Cervical pain 08/16/2014  . CAFL (chronic airflow limitation) ([place]) 08/16/2014  . Type 2 diabetes mellitus with peripheral neuropathy ([place]) 08/16/2014  . Diabetes mellitus type 2, insulin dependent ([place]) 08/16/2014  . Dyslipidemia 08/16/2014  . Tobacco use disorder 08/16/2014  . Essential (primary) hypertension 08/16/2014  . Benign neoplasm of stomach 08/16/2014  . Gout 08/16/2014  . HLD (hyperlipidemia) 08/16/2014  . Low back pain 08/16/2014  . Lumbar radiculopathy 08/16/2014  . Headache, migraine 08/16/2014  . Arthralgia of multiple joints 08/16/2014  . Avitaminosis D 08/16/2014  . Primary osteoarthritis of both knees 06/22/2014  . Benign essential tremor 10/02/2013  . Cervical dystonia 10/02/2013  . Chronic diastolic heart failure ([place]) 11/16/2012  . Chronic pain 11/13/2012    Past Surgical History:  Procedure Laterality Date  . CARPAL TUNNEL RELEASE Bilateral    x2 right, 1x on left  . COLONOSCOPY    . COLONOSCOPY WITH PROPOFOL N/A 04/25/2017   Procedure: COLONOSCOPY WITH PROPOFOL;  Surgeon: [name], MD;  Location: [facility];  Service: Endoscopy;  Laterality: N/A;  diabetic-oral med  . DILATION AND CURETTAGE OF UTERUS    . EXTERNAL EAR SURGERY Left  x2  . POLYPECTOMY  04/25/2017   Procedure: POLYPECTOMY INTESTINAL;  Surgeon: [name], MD;  Location: Home;  Service: Endoscopy;;  . SPINE SURGERY     herniated disc  . TUBAL LIGATION      OB History   No obstetric history on file.      Home Medications    Prior to Admission medications   Medication Sig Start Date End Date Taking? Authorizing Provider  predniSONE (DELTASONE) 10 MG tablet 50 mg daily x 3 days, then 40 mg daily x 3 days, then 30 mg daily x 3 days, then 20 mg daily x 3 days, then 10 mg daily x 3 days. 02/19/20  Yes  Maite Burlison G, DO  albuterol (VENTOLIN HFA) 108 (90 Base) MCG/ACT inhaler INHALE 1 PUFF BY MOUTH AS NEEDED 12/21/19   [name], Drue Stager, MD  blood glucose meter kit and supplies KIT Dispense accuchek aviva plus; can change if needed e11.9 12/27/17   [name], [name], NP  budesonide-formoterol (SYMBICORT) 160-4.5 MCG/ACT inhaler USE 2 INHALATIONS INTO THE  LUNGS 2 TIMES DAILY 06/08/19   Erby Pian, MD  busPIRone (BUSPAR) 7.5 MG tablet Take 7.5 mg by mouth 2 (two) times daily.  08/04/19   [provider]  carbidopa-levodopa (SINEMET IR) 25-100 MG tablet Take 1.5 tablets by mouth 3 (three) times daily. 01/25/20 01/23/21  [provider]  cetirizine (ZYRTEC) 10 MG tablet Take 10 mg by mouth as needed for allergies.     [provider]  colchicine (COLCRYS) 0.6 MG tablet Take 1 tablet (0.6 mg total) by mouth daily. 08/05/19   [name], MD  cyclobenzaprine (FLEXERIL) 10 MG tablet Take 10 mg by mouth 3 (three) times daily.  08/04/19   [provider]  FLUoxetine (PROZAC) 40 MG capsule Take 1 capsule (40 mg total) by mouth daily. 12/21/19   Ursula Alert, MD  furosemide (LASIX) 40 MG tablet Take 1 tablet (40 mg total) by mouth daily as needed. 06/21/17   [name], [name], NP  gabapentin (NEURONTIN) 600 MG tablet Take 1 tablet (600 mg total) by mouth 3 (three) times daily. 06/21/17   [name], [name], NP  glucose blood test strip Use as directed to check blood glucose daily 06/21/17   [name], [name], NP  HUMALOG KWIKPEN 100 UNIT/ML KwikPen INJECT SUBCUTANEOUSLY 5 TO  10 UNITS 3 TIMES DAILY 08/06/19   [name], MD  hydrOXYzine (VISTARIL) 50 MG capsule TAKE 1 CAPSULE BY MOUTH TWICE DAILY AS NEEDED FOR SEVERE ANXIETY SYMPTOMS 01/12/20   Ursula Alert, MD  ibuprofen (ADVIL) 600 MG tablet Take by mouth.  10/06/19   [provider]  indomethacin (INDOCIN) 50 MG capsule indomethacin 50 mg capsule    [provider]  insulin glargine  (LANTUS SOLOSTAR) 100 UNIT/ML Solostar Pen Inject 20 Units into the skin daily. 08/05/19   [name], MD  Insulin Pen Needle 32G X 6 MM MISC 2 each by Does not apply route daily. 08/05/19   [name], MD  ipratropium-albuterol (DUONEB) 0.5-2.5 (3) MG/3ML SOLN Inhale 3 mLs into the lungs every 6 (six) hours as needed. 06/21/17   [name], [name], NP  metFORMIN (GLUCOPHAGE-XR) 750 MG 24 hr tablet Take 2 tablets (1,500 mg total) by mouth daily with breakfast. 08/05/19   [name], Drue Stager, MD  montelukast (SINGULAIR) 10 MG tablet TAKE 1 TABLET BY MOUTH  DAILY IN THE AFTERNOON 04/02/19   [name], MD  morphine (MS CONTIN) 30 MG 12 hr tablet  morphine ER 30 mg tablet,extended release 09/20/15   [provider]  naproxen (NAPROSYN) 500 MG tablet Take 500 mg by mouth 2 (two) times daily.  08/11/19   [provider]  omeprazole (PRILOSEC) 40 MG capsule Take 1 capsule (40 mg total) by mouth daily. 12/07/19 12/06/20  [name], MD  Oxycodone HCl 10 MG TABS Take 10 mg by mouth 4 (four) times daily as needed.  10/12/19   [provider]  promethazine (PHENERGAN) 25 MG tablet  04/28/18   [provider]  RELISTOR 150 MG TABS  05/26/18   [name], MD  rosuvastatin (CRESTOR) 5 MG tablet Take 1 tablet (5 mg total) by mouth at bedtime. 12/07/19   [name], MD  theophylline (UNIPHYL) 400 MG 24 hr tablet Take 1 tablet by mouth daily.  12/27/17   Erby Pian, MD  tiotropium (SPIRIVA) 18 MCG inhalation capsule Place 1 capsule into inhaler and inhale daily. pm 08/27/13   Erby Pian, MD  topiramate (TOPAMAX) 100 MG tablet Take 1 tablet by mouth 2 (two) times daily.  Patient not taking: Reported on 01/28/2020 10/12/16   [name], MD  traZODone (DESYREL) 50 MG tablet TAKE 1 TABLET BY MOUTH AT  BEDTIME 12/05/19   Sowles, Drue Stager, MD  Ubrogepant (UBRELVY) 100 MG TABS Take by mouth. 11/09/19   [provider]  Vitamin D, Ergocalciferol, (DRISDOL)  1.25 MG (50000 UNIT) CAPS capsule Take 50,000 Units by mouth once a week. 01/25/20   [provider]    Family History Family History  Problem Relation Age of Onset  . Emphysema Mother   . Anxiety disorder Mother   . Stroke Father   . Throat cancer Father   . Multiple sclerosis Daughter   . Bipolar disorder Daughter   . Cervical cancer Daughter   . Bipolar disorder Daughter   . Drug abuse Daughter   . Lung cancer Maternal Aunt   . Lung cancer Maternal Uncle   . Lung cancer Maternal Grandmother   . Lung cancer Maternal Grandfather     Social History Social History   Tobacco Use  . Smoking status: Current Every Day Smoker    Packs/day: 1.00    Years: 41.00    Pack years: 41.00    Types: Cigarettes    Start date: 05/19/1977  . Smokeless tobacco: Never Used  Vaping Use  . Vaping Use: Former  Substance Use Topics  . Alcohol use: No    Alcohol/week: 0.0 standard drinks  . Drug use: No     Allergies   Augmentin [amoxicillin-pot clavulanate] and Penicillins   Review of Systems Review of Systems  Constitutional: Negative.   Musculoskeletal:       Right wrist pain, swelling.    Physical Exam Triage Vital Signs ED Triage Vitals  Enc Vitals Group     BP 02/19/20 1838 116/73     Pulse Rate 02/19/20 1838 100     Resp 02/19/20 1838 18     Temp 02/19/20 1838 99.8 F (37.7 C)     Temp Source 02/19/20 1825 Oral     SpO2 02/19/20 1838 94 %     Weight --      Height --      Head Circumference --      Peak Flow --      Pain Score 02/19/20 1911 10     Pain Loc --      Pain Edu? --      Excl.  in [place]? --    Updated Vital Signs BP 116/73 (BP Location: Left Arm)   Pulse 100   Temp 99.8 F (37.7 C) (Oral)   Resp 18   SpO2 94%   Visual Acuity Right Eye Distance:   Left Eye Distance:   Bilateral Distance:    Right Eye Near:   Left Eye Near:    Bilateral Near:     Physical Exam Vitals and nursing note reviewed.  Constitutional:      General: She is  not in acute distress.    Appearance: Normal appearance. She is not ill-appearing.  Eyes:     General:        Right eye: No discharge.        Left eye: No discharge.     Conjunctiva/sclera: Conjunctivae normal.  Pulmonary:     Effort: Pulmonary effort is normal. No respiratory distress.  Musculoskeletal:     Comments: Right wrist - diffuse swelling and erythema.  Markedly decreased range of motion due to pain.  Wrist is exquisitely tender to palpation diffusely.  Neurological:     Mental Status: She is alert.  Psychiatric:        Mood and Affect: Mood normal.        Behavior: Behavior normal.    UC Treatments / Results  Labs (all labs ordered are listed, but only abnormal results are displayed) Labs Reviewed - No data to display  EKG   Radiology No results found.  Procedures Procedures (including critical care time)  Medications Ordered in UC Medications - No data to display  Initial Impression / Assessment and Plan / UC Course  I have reviewed the triage vital signs and the nursing notes.  Pertinent labs & imaging results that were available during my care of the patient were reviewed by me and considered in my medical decision making (see chart for details).    60 year old female presents with acute right wrist pain.  Patient has a history of gout.  I suspect gout versus osteoarthritis.  Placing on prednisone.  Patient requested a splint for comfort.  Splint was placed by nursing staff.  Final Clinical Impressions(s) / UC Diagnoses   Final diagnoses:  Right wrist pain  Acute gout of right wrist, unspecified cause     Discharge Instructions     Medication as prescribed.  Take care  Dr. Lacinda Axon    ED Prescriptions    Medication Sig Dispense Auth. Provider   predniSONE (DELTASONE) 10 MG tablet 50 mg daily x 3 days, then 40 mg daily x 3 days, then 30 mg daily x 3 days, then 20 mg daily x 3 days, then 10 mg daily x 3 days. 45 tablet Coral Spikes, DO      PDMP not reviewed this encounter.   Thersa Salt [place], [place] 02/20/20 [phone]

## 2020-02-24 ENCOUNTER — Telehealth: Payer: Medicare Other

## 2020-02-24 ENCOUNTER — Other Ambulatory Visit: Payer: Self-pay

## 2020-02-24 ENCOUNTER — Telehealth (INDEPENDENT_AMBULATORY_CARE_PROVIDER_SITE_OTHER): Payer: Medicare Other | Admitting: Psychiatry

## 2020-02-24 ENCOUNTER — Encounter: Payer: Self-pay | Admitting: Psychiatry

## 2020-02-24 ENCOUNTER — Telehealth: Payer: Self-pay

## 2020-02-24 DIAGNOSIS — F331 Major depressive disorder, recurrent, moderate: Secondary | ICD-10-CM | POA: Diagnosis not present

## 2020-02-24 DIAGNOSIS — F411 Generalized anxiety disorder: Secondary | ICD-10-CM

## 2020-02-24 DIAGNOSIS — F17200 Nicotine dependence, unspecified, uncomplicated: Secondary | ICD-10-CM | POA: Diagnosis not present

## 2020-02-24 MED ORDER — FLUOXETINE HCL 40 MG PO CAPS: 40.0000 mg | ORAL_CAPSULE | Freq: Every day | ORAL | 1 refills | Status: DC

## 2020-02-24 MED ORDER — BUSPIRONE HCL 10 MG PO TABS: 10.0000 mg | ORAL_TABLET | Freq: Three times a day (TID) | ORAL | 1 refills | Status: DC

## 2020-02-24 NOTE — Progress Notes (Signed)
Virtual Visit via Telephone Note  I connected with [name] on 02/24/20 at  3:40 PM EST by telephone and verified that I am speaking with the correct person using two identifiers. Location Provider Location : [facility] Patient Location : Home  Participants: Patient , Provider   I discussed the limitations, risks, security and privacy concerns of performing an evaluation and management service by telephone and the availability of in person appointments. I also discussed with the patient that there may be a patient responsible charge related to this service. The patient expressed understanding and agreed to proceed.    I discussed the assessment and treatment plan with the patient. The patient was provided an opportunity to ask questions and all were answered. The patient agreed with the plan and demonstrated an understanding of the instructions.   The patient was advised to call back or seek an in-person evaluation if the symptoms worsen or if the condition fails to improve as anticipated.   Rafael Hernandez MD OP Progress Note  02/24/2020 5:14 PM SIDNEY SILBERMAN  MRN:  [id number]  Chief Complaint:  Chief Complaint    Follow-up     HPI: Tricia [name] is a 60 year old Caucasian female on disability, divorced, lives at [facility], has a history of GAD, MDD, tobacco use disorder, carpal tunnel syndrome, atypical Parkinson's disease, polyneuropathy, cervical dystonia, migraine headaches, COPD, diastolic dysfunction, diabetes melitis, asthma, degenerative disc disease was evaluated by telemedicine today.  Patient today reports she is currently struggling with anxiety and depressive symptoms.  She reports she did not do anything to celebrate the holidays.  She continues to struggle with relationship struggles with her daughter.  She reports her daughter struggles with substance use disorder and it is a constant struggle for her.  Patient reports she is trying to take it one day at a  time.  Patient reports she no longer has any side effects to Prozac.  She denies any nausea at this time.  Patient reports sleep as good.  She denies any suicidality, homicidality or perceptual disturbances.  Patient reports her tremors have improved and she continues to follow-up with neurology.  She is on carbidopa/levodopa.  Patient reports she is motivated to stay in therapy and is awaiting a call from her therapist.  She continues to smoke cigarettes.  Patient reports she has not been able to cut back.  She is motivated to do so.  Patient denies any other concerns today.  Visit Diagnosis:    ICD-10-CM   1. GAD (generalized anxiety disorder)  F41.1 busPIRone (BUSPAR) 10 MG tablet    FLUoxetine (PROZAC) 40 MG capsule  2. MDD (major depressive disorder), recurrent episode, moderate (HCC)  F33.1 busPIRone (BUSPAR) 10 MG tablet    FLUoxetine (PROZAC) 40 MG capsule  3. Tobacco use disorder  F17.200     Past Psychiatric History: I have reviewed past psychiatric history from my progress note on 10/13/2019.  Past trials of Cymbalta, sertraline, Lexapro, Xanax, Effexor  Past Medical History:  Past Medical History:  Diagnosis Date  . Anxiety   . Arthritis    joints and hands/ knees  . Asthma    uses inhaler  . Benign essential tremor    head  . Cervical dystonia    neck pain  . Cholesteatoma of left ear    x2  . COPD (chronic obstructive pulmonary disease) ([place])   . Cough   . Depression   . Diabetes mellitus without complication ([place])    type 2  .  Diastolic dysfunction   . Dyspnea   . Dysrhythmia    diastolic dysfunction  . GERD (gastroesophageal reflux disease)   . Headache    migraines/ one per week  . 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 Hyperlipidemia   . Hypertension   . Motion sickness    boat  . Neuromuscular disorder (HCC)    neuropathy feet and hands( nerve damage)  . Wears dentures    upper and lower    Past Surgical History:   Procedure Laterality Date  . CARPAL TUNNEL RELEASE Bilateral    x2 right, 1x on left  . COLONOSCOPY    . COLONOSCOPY WITH PROPOFOL N/A 04/25/2017   Procedure: COLONOSCOPY WITH PROPOFOL;  Surgeon: [name], MD;  Location: [place];  Service: Endoscopy;  Laterality: N/A;  diabetic-oral med  . DILATION AND CURETTAGE OF UTERUS    . EXTERNAL EAR SURGERY Left    x2  . POLYPECTOMY  04/25/2017   Procedure: POLYPECTOMY INTESTINAL;  Surgeon: [name], MD;  Location: [place];  Service: Endoscopy;;  . SPINE SURGERY     herniated disc  . TUBAL LIGATION      Family Psychiatric History: I have reviewed family psychiatric history from my progress note on 10/13/2019  Family History:  Family History  Problem Relation Age of Onset  . Emphysema Mother   . Anxiety disorder Mother   . Stroke Father   . Throat cancer Father   . Multiple sclerosis Daughter   . Bipolar disorder Daughter   . Cervical cancer Daughter   . Bipolar disorder Daughter   . Drug abuse Daughter   . Lung cancer Maternal Aunt   . Lung cancer Maternal Uncle   . Lung cancer Maternal Grandmother   . Lung cancer Maternal Grandfather     Social History: Reviewed social history from my progress note on 10/13/2019 Social History   Socioeconomic History  . Marital status: Divorced    Spouse name: Not on file  . Number of children: 2  . Years of education: Not on file  . Highest education level: Associate degree: academic program  Occupational History  . Occupation: Disability  Tobacco Use  . Smoking status: Current Every Day Smoker    Packs/day: 1.00    Years: 41.00    Pack years: 41.00    Types: Cigarettes    Start date: 05/19/1977  . Smokeless tobacco: Never Used  Vaping Use  . Vaping Use: Former  Substance and Sexual Activity  . Alcohol use: No    Alcohol/week: 0.0 standard drinks  . Drug use: No  . Sexual activity: Not Currently    Birth control/protection: None  Other Topics Concern   . Not on file  Social History Narrative   Lives with her boyfriend, she has two daughters.    Social Determinants of Health   Financial Resource Strain: High Risk  . Difficulty of Paying Living Expenses: Very hard  Food Insecurity: No Food Insecurity  . Worried About Charity fundraiser in the Last Year: Never true  . Ran Out of Food in the Last Year: Never true  Transportation Needs: No Transportation Needs  . Lack of Transportation (Medical): No  . Lack of Transportation (Non-Medical): No  Physical Activity: Inactive  . Days of Exercise per Week: 0 days  . Minutes of Exercise per Session: 0 min  Stress: Stress Concern Present  . Feeling of Stress : Rather much  Social  Connections: Moderately Isolated  . Frequency of Communication with Friends and Family: More than three times a week  . Frequency of Social Gatherings with Friends and Family: Once a week  . Attends Religious Services: Never  . Active Member of Clubs or Organizations: No  . Attends Archivist Meetings: Never  . Marital Status: Living with partner    Allergies:  Allergies  Allergen Reactions  . Augmentin [Amoxicillin-Pot Clavulanate] Diarrhea  . Penicillins Itching    Metabolic Disorder Labs: Lab Results  Component Value Date   HGBA1C 6.9 (A) 12/07/2019   MPG 148 08/05/2019   MPG 146 08/27/2018   No results found for: PROLACTIN Lab Results  Component Value Date   CHOL 148 08/05/2019   TRIG 193 (H) 08/05/2019   HDL 39 (L) 08/05/2019   CHOLHDL 3.8 08/05/2019   VLDL 34 10/09/2012   LDLCALC 80 08/05/2019   LDLCALC 50 10/29/2017   Lab Results  Component Value Date   TSH 0.31 (L) 08/05/2019   TSH 1.380 10/04/2014    Therapeutic Level Labs: No results found for: LITHIUM No results found for: VALPROATE No components found for:  CBMZ  Current Medications: Current Outpatient Medications  Medication Sig Dispense Refill  . busPIRone (BUSPAR) 10 MG tablet Take 1 tablet (10 mg total) by  mouth 3 (three) times daily. 90 tablet 1  . albuterol (VENTOLIN HFA) 108 (90 Base) MCG/ACT inhaler INHALE 1 PUFF BY MOUTH AS NEEDED 18 g 1  . blood glucose meter kit and supplies KIT Dispense accuchek aviva plus; can change if needed e11.9 1 each 0  . budesonide-formoterol (SYMBICORT) 160-4.5 MCG/ACT inhaler USE 2 INHALATIONS INTO THE  LUNGS 2 TIMES DAILY    . carbidopa-levodopa (SINEMET IR) 25-100 MG tablet Take 1.5 tablets by mouth 3 (three) times daily.    . cetirizine (ZYRTEC) 10 MG tablet Take 10 mg by mouth as needed for allergies.     Marland Kitchen colchicine (COLCRYS) 0.6 MG tablet Take 1 tablet (0.6 mg total) by mouth daily. 30 tablet 0  . cyclobenzaprine (FLEXERIL) 10 MG tablet Take 10 mg by mouth 3 (three) times daily.     Marland Kitchen FLUoxetine (PROZAC) 40 MG capsule Take 1 capsule (40 mg total) by mouth daily. 30 capsule 1  . furosemide (LASIX) 40 MG tablet Take 1 tablet (40 mg total) by mouth daily as needed. 30 tablet 2  . gabapentin (NEURONTIN) 600 MG tablet Take 1 tablet (600 mg total) by mouth 3 (three) times daily. 90 tablet 3  . glucose blood test strip Use as directed to check blood glucose daily 100 each 2  . HUMALOG KWIKPEN 100 UNIT/ML KwikPen INJECT SUBCUTANEOUSLY 5 TO  10 UNITS 3 TIMES DAILY 30 mL 2  . hydrOXYzine (ATARAX/VISTARIL) 25 MG tablet Take 25 mg by mouth 2 (two) times daily.    . hydrOXYzine (VISTARIL) 50 MG capsule TAKE 1 CAPSULE BY MOUTH TWICE DAILY AS NEEDED FOR SEVERE ANXIETY SYMPTOMS 60 capsule 1  . ibuprofen (ADVIL) 600 MG tablet Take by mouth.     . indomethacin (INDOCIN) 50 MG capsule indomethacin 50 mg capsule    . insulin glargine (LANTUS SOLOSTAR) 100 UNIT/ML Solostar Pen Inject 20 Units into the skin daily. 15 pen 1  . Insulin Pen Needle 32G X 6 MM MISC 2 each by Does not apply route daily. 200 each 2  . ipratropium-albuterol (DUONEB) 0.5-2.5 (3) MG/3ML SOLN Inhale 3 mLs into the lungs every 6 (six) hours as needed. 360 mL 3  .  lidocaine (LIDODERM) 5 % 2 patches daily.     Marland Kitchen lubiprostone (AMITIZA) 24 MCG capsule     . metFORMIN (GLUCOPHAGE-XR) 750 MG 24 hr tablet Take 2 tablets (1,500 mg total) by mouth daily with breakfast. 180 tablet 1  . montelukast (SINGULAIR) 10 MG tablet TAKE 1 TABLET BY MOUTH  DAILY IN THE AFTERNOON 90 tablet 2  . morphine (MS CONTIN) 30 MG 12 hr tablet morphine ER 30 mg tablet,extended release    . naproxen (NAPROSYN) 500 MG tablet Take 500 mg by mouth 2 (two) times daily.     Marland Kitchen omeprazole (PRILOSEC) 40 MG capsule Take 1 capsule (40 mg total) by mouth daily. 90 capsule 1  . Oxycodone HCl 10 MG TABS Take 10 mg by mouth 4 (four) times daily as needed.     . predniSONE (DELTASONE) 10 MG tablet 50 mg daily x 3 days, then 40 mg daily x 3 days, then 30 mg daily x 3 days, then 20 mg daily x 3 days, then 10 mg daily x 3 days. 45 tablet 0  . promethazine (PHENERGAN) 25 MG tablet     . RELISTOR 150 MG TABS     . rosuvastatin (CRESTOR) 5 MG tablet Take 1 tablet (5 mg total) by mouth at bedtime. 90 tablet 1  . theophylline (UNIPHYL) 400 MG 24 hr tablet Take 1 tablet by mouth daily.     Marland Kitchen tiotropium (SPIRIVA) 18 MCG inhalation capsule Place 1 capsule into inhaler and inhale daily. pm    . topiramate (TOPAMAX) 100 MG tablet Take 1 tablet by mouth 2 (two) times daily.  (Patient not taking: Reported on 01/28/2020)    . traZODone (DESYREL) 50 MG tablet TAKE 1 TABLET BY MOUTH AT  BEDTIME 90 tablet 1  . Ubrogepant (UBRELVY) 100 MG TABS Take by mouth.    . Vitamin D, Ergocalciferol, (DRISDOL) 1.25 MG (50000 UNIT) CAPS capsule Take 50,000 Units by mouth once a week.     No current facility-administered medications for this visit.     Musculoskeletal: Strength & Muscle Tone: UTA Gait & Station: UTA Patient leans: N/A  Psychiatric Specialty Exam: Review of Systems  Neurological: Positive for tremors.  Psychiatric/Behavioral: Positive for dysphoric mood. The patient is nervous/anxious.   All other systems reviewed and are negative.   There were no  vitals taken for this visit.There is no height or weight on file to calculate BMI.  General Appearance: UTA  Eye Contact:  UTA  Speech:  Clear and Coherent  Volume:  Normal  Mood:  Anxious and Dysphoric  Affect:  UTA  Thought Process:  Goal Directed and Descriptions of Associations: Intact  Orientation:  Full (Time, Place, and Person)  Thought Content: Rumination   Suicidal Thoughts:  No  Homicidal Thoughts:  No  Memory:  Immediate;   Fair Recent;   Fair Remote;   Fair  Judgement:  Fair  Insight:  Fair  Psychomotor Activity:  UTA  Concentration:  Concentration: Fair and Attention Span: Fair  Recall:  [employer] of Knowledge: Fair  Language: Fair  Akathisia:  No  Handed:  Right  AIMS (if indicated): UTA  Assets:  Communication Skills Desire for Improvement Housing Social Support  ADL's:  Intact  Cognition: WNL  Sleep:  Fair   Screenings: GAD-7   Garment/textile technologist Visit from 03/19/2018 in [facility] Office Visit from 01/30/2018 in [facility] Office Visit from 10/29/2017 in [facility]  Total GAD-7  Score _0 PHQ2-9   Flowsheet Row Clinical Support from 01/28/2020 in [facility] Office Visit from 12/07/2019 in [facility] Office Visit from 10/14/2019 in [facility] Office Visit from 08/05/2019 in [facility] Office Visit from 05/04/2019 in [facility]  PHQ-2 Total Score _1 PHQ-9 Total Score _2 Assessment and Plan: Tricia [name] is a 60 year old Caucasian female on disability, lives in House, has a history of MDD, GAD, degenerative disc disease, diastolic dysfunction, diabetes melitis, migraine headaches, atypical Parkinson's disease was evaluated by telemedicine today.  Patient continues to struggle with depression and anxiety symptoms and will benefit from following  medication changes.  Plan as noted below.  Plan MDD-unstable Prozac 40 mg p.o. daily. Increase BuSpar to 10 mg p.o. 3 times daily Continue CBT with Ms. Zadie Rhine.  Patient encouraged to restart frequent psychotherapy sessions.  GAD-unstable Prozac 40 mg p.o. daily Increase BuSpar to 10 mg p.o. 3 times daily Hydroxyzine 50 mg p.o. twice daily as needed Continue CBT  Tobacco use disorder-unstable Provided counseling.  Follow-up in clinic in 3 to 4 weeks or sooner if needed.  I have spent atleast 20 minutes non face to face with patient today. More than 50 % of the time was spent for preparing to see the patient ( e.g., review of test, records ), ordering medications and test ,psychoeducation and supportive psychotherapy and care coordination,as well as documenting clinical information in electronic health record. This note was generated in part or whole with voice recognition software. Voice recognition is usually quite accurate but there are transcription errors that can and very often do occur. I apologize for any typographical errors that were not detected and corrected.      Ursula Alert, MD 02/25/2020, 9:40 AM

## 2020-02-24 NOTE — Telephone Encounter (Signed)
Encounter opened in error. Please disregard.

## 2020-02-24 NOTE — Telephone Encounter (Signed)
  Chronic Care Management   Outreach Note  02/24/2020 Name: KINYA MEINE MRN: [id number] DOB: 1960-05-14  Primary Care Provider: Alba Cory, MD Reason for referral : Chronic Care Management   An unsuccessful telephone outreach was attempted today. Ms. Ragan was referred to the case management team for assistance with care management and care coordination.    Follow Up Plan:  A HIPAA compliant voice message was left today requesting a return call.    [facility] Care Management [facility] [phone]

## 2020-02-29 ENCOUNTER — Telehealth: Payer: Self-pay

## 2020-02-29 NOTE — Chronic Care Management (AMB) (Signed)
Chronic Care Management Pharmacy Assistant   Name: Tricia [name]  MRN: [id number] DOB: 1960-10-28  Reason for Encounter: Medication Review - Initial Questions for Pharmacist visit on 03/02/2020.  Have you seen any other providers since your last visit?  02/19/2020 Thersa Salt , DO - ED - right wrist pain - acute gout of right wrist.  Any changes in your medications or health? Yes, she has started Colchicine for Gout.  Any side effects from any medications? Yes , Fluoxetine  Do you have an symptoms or problems not managed by your medications? No  Any concerns about your health right now? Yes , she states she having issues with stress.  Has your provider asked that you check blood pressure, blood sugar, or follow special diet at home? Patient checks blood sugar twice a day / no diet , but she has cut back on sugar / coffee.  Do you get any type of exercise on a regular basis? No   Can you think of a goal you would like to reach for your health? Patient states she would like to quit smoking.  Do you have any problems getting your medications? No   Is there anything that you would like to discuss during the appointment? Medication assistance  / refill on her colchicine / Stopped Fluoxetine x one week - nausea - vomiting  Patient is aware to have all medications and supplements at time of  appointment    PCP : [name], MD  Allergies:   Allergies  Allergen Reactions  . Augmentin [Amoxicillin-Pot Clavulanate] Diarrhea  . Penicillins Itching    Medications: Outpatient Encounter Medications as of 02/29/2020  Medication Sig Note  . albuterol (VENTOLIN HFA) 108 (90 Base) MCG/ACT inhaler INHALE 1 PUFF BY MOUTH AS NEEDED   . blood glucose meter kit and supplies KIT Dispense accuchek aviva plus; can change if needed e11.9   . budesonide-formoterol (SYMBICORT) 160-4.5 MCG/ACT inhaler USE 2 INHALATIONS INTO THE  LUNGS 2 TIMES DAILY   . busPIRone (BUSPAR) 10 MG tablet  Take 1 tablet (10 mg total) by mouth 3 (three) times daily.   . carbidopa-levodopa (SINEMET IR) 25-100 MG tablet Take 1.5 tablets by mouth 3 (three) times daily.   . cetirizine (ZYRTEC) 10 MG tablet Take 10 mg by mouth as needed for allergies.    Marland Kitchen colchicine (COLCRYS) 0.6 MG tablet Take 1 tablet (0.6 mg total) by mouth daily.   . cyclobenzaprine (FLEXERIL) 10 MG tablet Take 10 mg by mouth 3 (three) times daily.    Marland Kitchen FLUoxetine (PROZAC) 40 MG capsule Take 1 capsule (40 mg total) by mouth daily.   . furosemide (LASIX) 40 MG tablet Take 1 tablet (40 mg total) by mouth daily as needed. 12/26/2017: "not urinating" (3-4x daily)  . gabapentin (NEURONTIN) 600 MG tablet Take 1 tablet (600 mg total) by mouth 3 (three) times daily.   Marland Kitchen glucose blood test strip Use as directed to check blood glucose daily   . HUMALOG KWIKPEN 100 UNIT/ML KwikPen INJECT SUBCUTANEOUSLY 5 TO  10 UNITS 3 TIMES DAILY   . hydrOXYzine (ATARAX/VISTARIL) 25 MG tablet Take 25 mg by mouth 2 (two) times daily.   . hydrOXYzine (VISTARIL) 50 MG capsule TAKE 1 CAPSULE BY MOUTH TWICE DAILY AS NEEDED FOR SEVERE ANXIETY SYMPTOMS   . ibuprofen (ADVIL) 600 MG tablet Take by mouth.    . indomethacin (INDOCIN) 50 MG capsule indomethacin 50 mg capsule   . insulin glargine (LANTUS SOLOSTAR) 100 UNIT/ML Solostar  Pen Inject 20 Units into the skin daily.   . Insulin Pen Needle 32G X 6 MM MISC 2 each by Does not apply route daily.   Marland Kitchen ipratropium-albuterol (DUONEB) 0.5-2.5 (3) MG/3ML SOLN Inhale 3 mLs into the lungs every 6 (six) hours as needed.   . lidocaine (LIDODERM) 5 % 2 patches daily.   Marland Kitchen lubiprostone (AMITIZA) 24 MCG capsule    . metFORMIN (GLUCOPHAGE-XR) 750 MG 24 hr tablet Take 2 tablets (1,500 mg total) by mouth daily with breakfast.   . montelukast (SINGULAIR) 10 MG tablet TAKE 1 TABLET BY MOUTH  DAILY IN THE AFTERNOON   . morphine (MS CONTIN) 30 MG 12 hr tablet morphine ER 30 mg tablet,extended release   . naproxen (NAPROSYN) 500 MG  tablet Take 500 mg by mouth 2 (two) times daily.    Marland Kitchen omeprazole (PRILOSEC) 40 MG capsule Take 1 capsule (40 mg total) by mouth daily.   . Oxycodone HCl 10 MG TABS Take 10 mg by mouth 4 (four) times daily as needed.    . predniSONE (DELTASONE) 10 MG tablet 50 mg daily x 3 days, then 40 mg daily x 3 days, then 30 mg daily x 3 days, then 20 mg daily x 3 days, then 10 mg daily x 3 days.   . promethazine (PHENERGAN) 25 MG tablet    . RELISTOR 150 MG TABS    . rosuvastatin (CRESTOR) 5 MG tablet Take 1 tablet (5 mg total) by mouth at bedtime.   . theophylline (UNIPHYL) 400 MG 24 hr tablet Take 1 tablet by mouth daily.    Marland Kitchen tiotropium (SPIRIVA) 18 MCG inhalation capsule Place 1 capsule into inhaler and inhale daily. pm   . topiramate (TOPAMAX) 100 MG tablet Take 1 tablet by mouth 2 (two) times daily.  (Patient not taking: Reported on 01/28/2020)   . traZODone (DESYREL) 50 MG tablet TAKE 1 TABLET BY MOUTH AT  BEDTIME   . Ubrogepant (UBRELVY) 100 MG TABS Take by mouth.   . Vitamin D, Ergocalciferol, (DRISDOL) 1.25 MG (50000 UNIT) CAPS capsule Take 50,000 Units by mouth once a week.    No facility-administered encounter medications on file as of 02/29/2020.    Current Diagnosis: Patient Active Problem List   Diagnosis Date Noted  . History of prolonged Q-T interval on ECG 11/18/2019  . GAD (generalized anxiety disorder) 10/13/2019  . MDD (major depressive disorder), recurrent episode, moderate ([place]) 10/13/2019  . At risk for long QT syndrome 10/13/2019  . Nonrheumatic mitral valve regurgitation 05/04/2019  . Benign neoplasm of descending colon   . Polyp of sigmoid colon   . 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 Polyneuropathy 10/21/2014  . Chronic venous insufficiency 10/05/2014  . Bilateral leg edema 08/16/2014  . Major depression in partial remission ([place]) 08/16/2014  . Acid reflux 08/16/2014  . 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 Asthma, moderate persistent 08/16/2014  .  Carpal tunnel syndrome 08/16/2014  . Cervical pain 08/16/2014  . CAFL (chronic airflow limitation) ([place]) 08/16/2014  . Type 2 diabetes mellitus with peripheral neuropathy ([place]) 08/16/2014  . Diabetes mellitus type 2, insulin dependent ([place]) 08/16/2014  . Dyslipidemia 08/16/2014  . Tobacco use disorder 08/16/2014  . Essential (primary) hypertension 08/16/2014  . Benign neoplasm of stomach 08/16/2014  . Gout 08/16/2014  . HLD (hyperlipidemia) 08/16/2014  . Low back pain 08/16/2014  . Lumbar radiculopathy 08/16/2014  . Headache, migraine 08/16/2014  . Arthralgia of multiple joints 08/16/2014  . Avitaminosis D 08/16/2014  .  Primary osteoarthritis of both knees 06/22/2014  . Benign essential tremor 10/02/2013  . Cervical dystonia 10/02/2013  . Chronic diastolic heart failure ([place]) 11/16/2012  . Chronic pain 11/13/2012    Follow-Up:  Pharmacist Review - Patient states she went to ED was diagnosed with  gout right wrist and thumb, she was given a script for Colchicine, states she needs a refill sent to [facility], in [place].   Patient also states she has stopped her Fluoxetine x one week because of nausea / vomiting .   Patient also states she would like to discuss with you about patient assistance with some of her medications.   Daron Offer, CPP Notified  Judithann Sheen, [facility] Clinical Pharmacist Assistant [phone]

## 2020-03-02 ENCOUNTER — Ambulatory Visit: Payer: Medicare Other

## 2020-03-02 DIAGNOSIS — E1142 Type 2 diabetes mellitus with diabetic polyneuropathy: Secondary | ICD-10-CM

## 2020-03-02 DIAGNOSIS — I10 Essential (primary) hypertension: Secondary | ICD-10-CM

## 2020-03-02 NOTE — Chronic Care Management (AMB) (Signed)
Chronic Care Management Pharmacy  Name: Tricia [name]  MRN: [id number] DOB: [birth year]   Chief Complaint/ HPI  Tricia [name],  60 y.o. , female presents for her Initial CCM visit with the clinical pharmacist via telephone.  PCP : [name], MD Patient Care Team: [name], MD as PCP - General (Family Medicine) Erby Pian, MD as Consulting Physician (Pulmonary Disease) [name], MD as Consulting Physician (Neurology) Ubaldo Glassing Javier Docker, MD as Consulting Physician (Cardiology) Lonia Farber, MD as Consulting Physician (Internal Medicine) Neldon Labella, RN as Registered Nurse Michaelle Birks, Glean Salvo, [facility] (Pharmacist) Ursula Alert, MD as Consulting Physician (Psychiatry)  Patient's chronic conditions include: Hypertension, Hyperlipidemia, Diabetes, Heart Failure, GERD, COPD, Asthma, Depression, Anxiety, Osteoarthritis, Tobacco use and Chronic Pain, And Parkinson's Disease   Office Visits: 01/28/20: Patient presented to Clemetine Marker, LPN for AWV.  12/22/13: Patient presented to Dr. [name] for Follow-up. A1c stable at 6.9%.  Ajovy stopped.   Consult Visit: 02/24/20: Patient presented to Dr. [name] [facility]) for follow-up.  02/19/20: Patient presented to ED for right wrist pain.  01/25/20: Patient presented to Dr. Manuella Ghazi for Parkinson's follow-up. Sinemet increased to 1.5 tablets three times daily. Emgality discontinued.  01/18/20: Patient presented to Dr. [name] [facility]) for follow-up.  12/21/19: Patient presented to Dr. [name] [facility]) for follow-up. 12/01/19: Patient presented to Dr. Raul Del (Pulmonology) for follow-up. Patient with wheezing + green phlegm. Medrol dose pack + doxycycline started for 10 days. 5-monthfollow-up.    Subjective: Overall, patient reports she is doing well. She still struggles with her pain and reports feeling overwhelmed with all of her different medications. She has also been feeling  stressed because her boyfriend is currently hospitalized for a heart failure exacerbation.   Luckily, she reports she has been able to afford her medications as she is covered under the [employer] Program.   Objective: Allergies  Allergen Reactions  . Augmentin [Amoxicillin-Pot Clavulanate] Diarrhea  . Penicillins Itching    Medications: Outpatient Encounter Medications as of 03/02/2020  Medication Sig Note  . albuterol (VENTOLIN HFA) 108 (90 Base) MCG/ACT inhaler INHALE 1 PUFF BY MOUTH AS NEEDED   . budesonide-formoterol (SYMBICORT) 160-4.5 MCG/ACT inhaler Inhale 2 puffs into the lungs in the morning and at bedtime. 03/04/2020: Prescribed by Dr. FVella Kohler . busPIRone (BUSPAR) 10 MG tablet Take 1 tablet (10 mg total) by mouth 3 (three) times daily.   . carbidopa-levodopa (SINEMET IR) 25-100 MG tablet Take 1.5 tablets by mouth 3 (three) times daily. 03/04/2020: Prescribed by Dr. SManuella Ghazi . cetirizine (ZYRTEC) 10 MG tablet Take 10 mg by mouth daily as needed for allergies.   . cyclobenzaprine (FLEXERIL) 10 MG tablet Take 10 mg by mouth 3 (three) times daily.  03/04/2020: Prescribed by BFarris Has PA-C  . furosemide (LASIX) 40 MG tablet Take 1 tablet (40 mg total) by mouth daily as needed.   . gabapentin (NEURONTIN) 600 MG tablet Take 1 tablet (600 mg total) by mouth 3 (three) times daily. 03/04/2020: Prescribed by Dr. SManuella Ghazi . hydrOXYzine (ATARAX/VISTARIL) 25 MG tablet Take 25 mg by mouth 2 (two) times daily.   . hydrOXYzine (VISTARIL) 50 MG capsule TAKE 1 CAPSULE BY MOUTH TWICE DAILY AS NEEDED FOR SEVERE ANXIETY SYMPTOMS   . ipratropium-albuterol (DUONEB) 0.5-2.5 (3) MG/3ML SOLN Inhale 3 mLs into the lungs every 6 (six) hours as needed.   . lidocaine (LIDODERM) 5 % 1 patch every 12 (twelve) hours as needed. 03/04/2020: Prescribed by BArbie Cookey  Henson, PA-C  . lubiprostone (AMITIZA) 24 MCG capsule Take by mouth 2 (two) times daily as needed. 03/04/2020: Prescribed by Farris Has, PA-C  . metFORMIN  (GLUCOPHAGE-XR) 750 MG 24 hr tablet Take 2 tablets (1,500 mg total) by mouth daily with breakfast. (Patient taking differently: Take 750 mg by mouth daily with breakfast.)   . montelukast (SINGULAIR) 10 MG tablet TAKE 1 TABLET BY MOUTH  DAILY IN THE AFTERNOON   . morphine (MS CONTIN) 30 MG 12 hr tablet Take 30 mg by mouth every 12 (twelve) hours. 03/04/2020: Prescribed by Farris Has, PA-C  . naproxen sodium (ALEVE) 220 MG tablet Take 220 mg by mouth daily as needed.   Marland Kitchen omeprazole (PRILOSEC) 40 MG capsule Take 1 capsule (40 mg total) by mouth daily.   . Oxycodone HCl 10 MG TABS Take 10 mg by mouth 4 (four) times daily as needed.  03/04/2020: Prescribed by Farris Has, PA-C  . promethazine (PHENERGAN) 25 MG tablet Take 25 mg by mouth daily as needed. 03/04/2020: Prescribed by Dr. Manuella Ghazi  . rosuvastatin (CRESTOR) 5 MG tablet Take 1 tablet (5 mg total) by mouth at bedtime.   . theophylline (UNIPHYL) 400 MG 24 hr tablet Take 1 tablet by mouth daily.  03/04/2020: Prescribed by Dr. Vella Kohler  . tiotropium (SPIRIVA) 18 MCG inhalation capsule Place 1 capsule into inhaler and inhale daily. pm 03/04/2020: Prescribed by Dr. Vella Kohler  . traZODone (DESYREL) 50 MG tablet TAKE 1 TABLET BY MOUTH AT  BEDTIME   . Ubrogepant (UBRELVY) 100 MG TABS Take 100 mg by mouth daily as needed. May repeat in 2 hours 03/04/2020: Prescribed by Dr. Manuella Ghazi  . [DISCONTINUED] naproxen (NAPROSYN) 500 MG tablet Take 500 mg by mouth 2 (two) times daily.    . blood glucose meter kit and supplies KIT Dispense accuchek aviva plus; can change if needed e11.9   . colchicine (COLCRYS) 0.6 MG tablet Take 1 tablet (0.6 mg total) by mouth daily. (Patient not taking: Reported on 03/02/2020) 03/02/2020: Only takes PRN  . glucose blood test strip Use as directed to check blood glucose daily   . HUMALOG KWIKPEN 100 UNIT/ML KwikPen INJECT SUBCUTANEOUSLY 5 TO  10 UNITS 3 TIMES DAILY (Patient not taking: Reported on 03/02/2020)   . insulin glargine (LANTUS  SOLOSTAR) 100 UNIT/ML Solostar Pen Inject 20 Units into the skin daily. (Patient not taking: Reported on 03/02/2020)   . Insulin Pen Needle 32G X 6 MM MISC 2 each by Does not apply route daily.   . [DISCONTINUED] FLUoxetine (PROZAC) 40 MG capsule Take 1 capsule (40 mg total) by mouth daily. (Patient not taking: Reported on 03/02/2020)   . [DISCONTINUED] ibuprofen (ADVIL) 600 MG tablet Take by mouth.    . [DISCONTINUED] indomethacin (INDOCIN) 50 MG capsule indomethacin 50 mg capsule   . [DISCONTINUED] predniSONE (DELTASONE) 10 MG tablet 50 mg daily x 3 days, then 40 mg daily x 3 days, then 30 mg daily x 3 days, then 20 mg daily x 3 days, then 10 mg daily x 3 days.   . [DISCONTINUED] RELISTOR 150 MG TABS    . [DISCONTINUED] topiramate (TOPAMAX) 100 MG tablet Take 1 tablet by mouth 2 (two) times daily.  (Patient not taking: Reported on 01/28/2020)   . [DISCONTINUED] Vitamin D, Ergocalciferol, (DRISDOL) 1.25 MG (50000 UNIT) CAPS capsule Take 50,000 Units by mouth once a week.    No facility-administered encounter medications on file as of 03/02/2020.    Wt Readings from Last 3 Encounters:  01/28/20 174  lb 1.6 oz (79 kg)  12/07/19 172 lb (78 kg)  10/14/19 172 lb 4.8 oz (78.2 kg)    Lab Results  Component Value Date   CREATININE 0.85 08/05/2019   BUN 10 08/05/2019   GFRNONAA 76 08/05/2019   GFRAA 88 08/05/2019   NA 136 08/05/2019   K 4.0 08/05/2019   CALCIUM 8.9 08/05/2019   CO2 26 08/05/2019     Current Diagnosis/Assessment:  SDOH Interventions   Flowsheet Row Most Recent Value  SDOH Interventions   Financial Strain Interventions Intervention Not Indicated  Transportation Interventions Intervention Not Indicated      Goals Addressed            This Visit's Progress   . Chronic Care Management       CARE PLAN ENTRY (see longitudinal plan of care for additional care plan information)  Current Barriers:  . Chronic Disease Management support, education, and care coordination  needs related to Hypertension, Hyperlipidemia, Diabetes, Heart Failure, GERD, COPD, Asthma, Depression, Anxiety, Osteoarthritis, Tobacco use and Chronic Pain, And Parkinson's Disease    Hypertension BP Readings from Last 3 Encounters:  02/19/20 116/73  01/28/20 118/72  12/07/19 100/60   . Pharmacist Clinical Goal(s): o Over the next 90 days, patient will work with PharmD and providers to maintain BP goal <130/80 . Current regimen:  o Furosemide 40 mg daily PRN  . Interventions: o Discussed low salt diet and exercising as tolerated extensively o Will initiate blood pressure monitoring plan  . Patient self care activities - Over the next 90 days, patient will: o Check blood pressure when symptomatic, document, and provide at future appointments o Ensure daily salt intake < 2300 mg/day  Hyperlipidemia Lab Results  Component Value Date/Time   LDLCALC 80 08/05/2019 03:04 PM   [place] 85 10/09/2012 04:52 AM   . Pharmacist Clinical Goal(s): o Over the next 90 days, patient will work with PharmD and providers to maintain LDL goal < 100 . Current regimen:  o Rosuvastatin 5 mg daily  . Interventions: o Discussed low cholesterol diet and exercising as tolerated extensively o Will initiate cholesterol monitoring plan   Diabetes Lab Results  Component Value Date/Time   HGBA1C 6.9 (A) 12/07/2019 02:18 PM   HGBA1C 6.8 (H) 08/05/2019 03:04 PM   HGBA1C 6.7 (H) 08/27/2018 02:01 PM   HGBA1C 13.7 (A) 03/19/2018 02:04 PM   HGBA1C 14.0 (A) 10/29/2017 02:18 PM   HGBA1C 7.4 (H) 10/09/2012 04:52 AM   HGBA1C 7.1 (H) 07/02/2012 05:33 AM   . Pharmacist Clinical Goal(s): o Over the next 90 days, patient will work with PharmD and providers to maintain A1c goal <7% . Current regimen:  . Humalog 5-10 units three times daily (not taking) . Lantus 20 units daily (not taking) . Metformin XR 750 mg 2 tablets daily (only taking one daily)  . Interventions: o Resume taking Metformin 2 tablets daily as  prescribed o Discussed carbohydrate counting and exercising as tolerated extensively o Will initiate blood sugar monitoring plan  . Patient self care activities - Over the next 90 days, patient will: o Check blood sugar once daily, document, and provide at future appointments o Contact provider with any episodes of hypoglycemia  Medication management . Pharmacist Clinical Goal(s): o Over the next 90 days, patient will work with PharmD and providers to maintain optimal medication adherence . Current pharmacy: [facility] . Interventions o Comprehensive medication review performed. o Continue current medication management strategy . Patient self care activities - Over the  next 90 days, patient will: o Take medications as prescribed o Report any questions or concerns to PharmD and/or provider(s)      Hypertension   BP goal is:  <130/80  Office blood pressures are  BP Readings from Last 3 Encounters:  02/19/20 116/73  01/28/20 118/72  12/07/19 100/60   Patient checks BP at home infrequently Patient home BP readings are ranging: NA  Patient has failed these meds in the past: NA Patient is currently controlled on the following medications:  . Furosemide 40 mg daily as needed  We discussed weighing daily; if you gain more than 3 pounds in one day or 5 pounds in one week call your doctor  Plan  Continue current medications   Heart Failure   Type: Diastolic  Last ejection fraction: 60-65% (2016)  NYHA Class: II (slight limitation of activity) AHA HF Stage: B (Heart disease present - no symptoms present)  Patient has failed these meds in past: NA Patient is currently controlled on the following medications:   . Furosemide 40 mg daily as needed  We discussed weighing daily; if you gain more than 3 pounds in one day or 5 pounds in one week call your doctor  Plan  Continue current medications  Hyperlipidemia   LDL goal < 100  Last lipids Lab Results  Component  Value Date   CHOL 148 08/05/2019   HDL 39 (L) 08/05/2019   LDLCALC 80 08/05/2019   TRIG 193 (H) 08/05/2019   CHOLHDL 3.8 08/05/2019   Hepatic Function Latest Ref Rng & Units 08/05/2019 08/27/2018 10/29/2017  Total Protein 6.1 - 8.1 g/dL 6.5 7.0 6.0(L)  Albumin 3.5 - 5.0 g/dL - 4.1 -  AST 10 - 35 U/L 7(L) 18 13  ALT 6 - 29 U/L _0 Alk Phosphatase 38 - 126 U/L - 77 -  Total Bilirubin 0.2 - 1.2 mg/dL 0.3 0.3 0.3     The 10-year ASCVD risk score Mikey Bussing DC Jr., et al., 2013) is: 13.2%   Values used to calculate the score:     Age: 31 years     Sex: Female     Is Non-Hispanic African American: No     Diabetic: Yes     Tobacco smoker: Yes     Systolic Blood Pressure: 361 mmHg     Is BP treated: Yes     HDL Cholesterol: 39 mg/dL     Total Cholesterol: 148 mg/dL   Patient has failed these meds in past: NA Patient is currently controlled on the following medications:  . Rosuvastatin 5 mg daily   We discussed:  diet and exercise extensively. Patient tolerating well, denies myalgias.    Plan  Continue current medications  Diabetes   A1c goal <7%  Recent Relevant Labs: Lab Results  Component Value Date/Time   HGBA1C 6.9 (A) 12/07/2019 02:18 PM   HGBA1C 6.8 (H) 08/05/2019 03:04 PM   HGBA1C 6.7 (H) 08/27/2018 02:01 PM   HGBA1C 13.7 (A) 03/19/2018 02:04 PM   HGBA1C 14.0 (A) 10/29/2017 02:18 PM   HGBA1C 7.4 (H) 10/09/2012 04:52 AM   HGBA1C 7.1 (H) 07/02/2012 05:33 AM   MICROALBUR 1.2 08/05/2019 03:04 PM   MICROALBUR 50 03/19/2018 02:07 PM   MICROALBUR 2.0 02/20/2017 11:55 AM   MICROALBUR 100 01/24/2015 12:22 PM    Last diabetic Eye exam:  Lab Results  Component Value Date/Time   HMDIABEYEEXA No Retinopathy 02/27/2019 12:00 AM    Last diabetic Foot exam: No results found  for: HMDIABFOOTEX   Checking BG: Daily typically, but patient had not been recently.  Recent FBG Readings: NA Recent pre-meal BG readings: NA Recent 2hr PP BG readings:  NA Recent HS BG readings:  NA  Patient has failed these meds in past: NA Patient is currently controlled on the following medications: . Humalog 5-10 units three times daily (not taking) . Lantus 20 units daily (not taking) . Metformin XR 750 mg 2 tablets daily (only taking one daily)   We discussed: diet and exercise extensively. Patient stopped her insulin as she disliked having to stick herself so often and "my A1c is much better." She has also only been taking her metformin daily.   Patient is interested in getting off insulin if possible.    Plan  Recommend resuming previous recommended dose of Metformin XR 750 mg twice daily for now.  Resume checking blood sugars daily  Will check in with patient in 3 weeks to re-assess readings  Depression / Anxiety   Managed by Dr. [name]  PHQ9 Score:  PHQ9 SCORE ONLY 01/28/2020 12/07/2019 10/14/2019  PHQ-9 Total Score _0 GAD7 Score: GAD 7 : Generalized Anxiety Score 03/19/2018 01/30/2018 10/29/2017  Nervous, Anxious, on Edge _1 Control/stop worrying _2 Worry too much - different things _3 Trouble relaxing _4 Restless _5 Easily annoyed or irritable _6 Afraid - awful might happen _7 Total GAD 7 Score _8 Anxiety Difficulty Very difficult Extremely difficult Very difficult    Patient has failed these meds in past: Effexor XR, alprazolam, lexapro,  Patient is currently controlled on the following medications:  . Buspirone 10 mg three times daily  . Hydroxyzine 25 mg twice daily  . Hydroxyzine 50 mg twice daily PRN for severe anxiety  . Trazodone 50 mg QHS   We discussed:  Patient discontinued Prozac due to significant nausea and multiple episodes of vomiting. She reports feeling very frustrated that her xanax was stopped by her psychiatrist. Counseled patient on the risks of using multiple CNS depressants and the risk of respiratory depression with BZD + opioid use.   Plan  Continue current medications  Dr. [name]  informed of discontinued Prozac.   COPD / Asthma / Tobacco   Managed by Dr. Raul Del   Last spirometry score: NA  Gold Grade: Gold 3 (FEV1 30-49%) Current COPD Classification:  B (high sx, <2 exacerbations/yr)  Eosinophil count:   Lab Results  Component Value Date/Time   EOSPCT 0.4 08/05/2019 03:04 PM   EOSPCT 0.9 11/16/2013 08:40 PM  %                               Eos (Absolute):  Lab Results  Component Value Date/Time   EOSABS 33 08/05/2019 03:04 PM   EOSABS 0.1 11/16/2013 08:40 PM    Tobacco Status:  Social History   Tobacco Use  Smoking Status Current Every Day Smoker  . Packs/day: 1.00  . Years: 41.00  . Pack years: 41.00  . Types: Cigarettes  . Start date: 05/19/1977  Smokeless Tobacco Never Used    Patient has failed these meds in past: NA Patient is currently controlled on the following medications:  . Albuterol HFA 108 mcg/act 1 puff as needed  . DuoNeb 3 mL q6hr PRN  . Symbicort 160-4.5 mcg/act 2 spray  twice daily . Spiriva 18 mcg daily  . Montelukast 10 mg daily  . Theophylline 400 mg daily . Cetirizine 10 mg daily PRN   Using maintenance inhaler regularly? Yes Frequency of rescue inhaler use:  infrequently  We discussed:  proper inhaler technique   Ventolin   Plan  Continue current medications  Tobacco Abuse   Tobacco Status:  Social History   Tobacco Use  Smoking Status Current Every Day Smoker  . Packs/day: 1.00  . Years: 41.00  . Pack years: 41.00  . Types: Cigarettes  . Start date: 05/19/1977  Smokeless Tobacco Never Used    Patient smokes After 30 minutes of waking Patient triggers include: NA On a scale of 1-10, reports MOTIVATION to quit is NA On a scale of 1-10, reports CONFIDENCE in quitting is NA  Previous quit attempts included: Chantix Patient is currently uncontrolled on the following:  . 1 PPD   We discussed:  Patient knows she should quit smoking but not ready to discuss at this time.   Plan  Continue  current medications  Parkinson's Disease   Managed by Dr. Manuella Ghazi  Patient has failed these meds in past: NA Patient is currently controlled on the following medications:  . Carbidopa-Levodopa IR 25-100 mg 1.5 tablets three times daily   We discussed:  Patient tolerating dose change well. She feels her tremor is much improved since starting Sinemet   Plan  Continue current medications  Chronic Pain   Managed by Farris Has, PA-C  Patient has failed these meds in past: NA Patient is currently controlled on the following medications:  . Cyclobenzaprine 10 mg three times daily . Gabapentin 600 mg three times daily (Dr. Manuella Ghazi) - sometimes does 1 tab TID, sometimes 2 tabs TID  . Lidocaine 5% 2 patches daily   . Morphine ER 30 mg twice daily  . Naproxen 500 mg twice daily . Oxycodone 10 mg four times daily  We discussed:  Patient on multiple CNS depressant therapies. Meets every 3 months with pain management at the spine clinic. Patient denies symptoms of CNS depression or oversedation.   Patient does not have narcan as her previous supply expired.     Plan  Continue current medications  Recommend patient obtain in date Narcan.   Opioid-induced constipation   Managed by Farris Has, PA-C  Patient has failed these meds in past: NA Patient is currently controlled on the following medications:  . Lubiprostone 24 mcg 2 caps daily   We discussed:  Only takes PRN. Has flares of constipation from time to time.   Plan  Continue current medications   GERD   Patient denies dysphagia, heartburn or nausea. Expresses understanding to avoid triggers such as citrus juices, lying down after eating and tomato sauce.  Currently controlled on: . Omeprazole 40 mg daily  Could consider discontinuing PPI if stable, will defer until next visit.   Plan   Continue current medication.  Migraines   Managed by Dr. Manuella Ghazi (Neurology)  Patient has failed these meds in past: Propranolol  (ineffective), Primidone (didn't help much), Topiramate Patient is currently controlled on the following medications:  Marland Kitchen Ubrelvy 100 mg . Naproxen 220 mg daily PRN - (headache)   We discussed:  Tries to limit NSAID use. Counseled patient on risks of long-term NSAID use.   Plan  Continue current medications  Gout   Uric Acid, Serum  Date Value Ref Range Status  12/19/2016 5.7 2.5 - 7.0 mg/dL Final    Comment:  Therapeutic target for gout patients: <6.0 mg/dL .      Goal Uric Acid < 6 mg/dL   Medications that may increase uric acid levels: None  Last gout flare: 1 week ago (went to urgent care)   Patient has failed these meds in past: NA Patient is currently uncontrolled on the following medications:  . Colchicine 0.6 mg daily   We discussed:  Counseled patient on low purine diet plan. Counseled patient to reduce consumption of high-fructose corn syrup, sweetened soft drinks, fruit juices, meat, and seafood.   Needs colchicine refill    Plan  Continue current medications   Insomnia   Patient has failed these meds in past: NA Patient is currently controlled on the following medications:  . Trazodone 50 mg QHS   We discussed:  Sleep much improved.  Plan  Continue current medications   Misc / OTC   . Prednisone 10 mg  . Promethazine 25 mg daily PRN   Plan  Recommend vitamin D level Continue current medications   Medication Management   Patient's preferred pharmacy is:  [facility], [address] [place] [address] Phone: [phone] Fax: [facility], [address], [address] [place], [address] [place] [address] Phone: [phone] Fax: [phone]  Uses pill box? Yes Pt endorses 100% compliance  Plan  Continue current medication management strategy  Follow up: 1 month phone visit  [facility] [phone]

## 2020-03-04 NOTE — Patient Instructions (Signed)
Visit Information It was great speaking with you today!  Please let me know if you have any questions about our visit.  Goals Addressed            This Visit's Progress   . Chronic Care Management       CARE PLAN ENTRY (see longitudinal plan of care for additional care plan information)  Current Barriers:  . Chronic Disease Management support, education, and care coordination needs related to Hypertension, Hyperlipidemia, Diabetes, Heart Failure, GERD, COPD, Asthma, Depression, Anxiety, Osteoarthritis, Tobacco use and Chronic Pain, And Parkinson's Disease    Hypertension BP Readings from Last 3 Encounters:  02/19/20 116/73  01/28/20 118/72  12/07/19 100/60   . Pharmacist Clinical Goal(s): o Over the next 90 days, patient will work with PharmD and providers to maintain BP goal <130/80 . Current regimen:  o Furosemide 40 mg daily PRN  . Interventions: o Discussed low salt diet and exercising as tolerated extensively o Will initiate blood pressure monitoring plan  . Patient self care activities - Over the next 90 days, patient will: o Check blood pressure when symptomatic, document, and provide at future appointments o Ensure daily salt intake < 2300 mg/day  Hyperlipidemia Lab Results  Component Value Date/Time   LDLCALC 80 08/05/2019 03:04 PM   [place] 85 10/09/2012 04:52 AM   . Pharmacist Clinical Goal(s): o Over the next 90 days, patient will work with PharmD and providers to maintain LDL goal < 100 . Current regimen:  o Rosuvastatin 5 mg daily  . Interventions: o Discussed low cholesterol diet and exercising as tolerated extensively o Will initiate cholesterol monitoring plan   Diabetes Lab Results  Component Value Date/Time   HGBA1C 6.9 (A) 12/07/2019 02:18 PM   HGBA1C 6.8 (H) 08/05/2019 03:04 PM   HGBA1C 6.7 (H) 08/27/2018 02:01 PM   HGBA1C 13.7 (A) 03/19/2018 02:04 PM   HGBA1C 14.0 (A) 10/29/2017 02:18 PM   HGBA1C 7.4 (H) 10/09/2012 04:52 AM   HGBA1C 7.1  (H) 07/02/2012 05:33 AM   . Pharmacist Clinical Goal(s): o Over the next 90 days, patient will work with PharmD and providers to maintain A1c goal <7% . Current regimen:  . Humalog 5-10 units three times daily (not taking) . Lantus 20 units daily (not taking) . Metformin XR 750 mg 2 tablets daily (only taking one daily)  . Interventions: o Resume taking Metformin 2 tablets daily as prescribed o Discussed carbohydrate counting and exercising as tolerated extensively o Will initiate blood sugar monitoring plan  . Patient self care activities - Over the next 90 days, patient will: o Check blood sugar once daily, document, and provide at future appointments o Contact provider with any episodes of hypoglycemia  Medication management . Pharmacist Clinical Goal(s): o Over the next 90 days, patient will work with PharmD and providers to maintain optimal medication adherence . Current pharmacy: [facility] . Interventions o Comprehensive medication review performed. o Continue current medication management strategy . Patient self care activities - Over the next 90 days, patient will: o Take medications as prescribed o Report any questions or concerns to PharmD and/or provider(s)       Ms. Henkes was given information about Chronic Care Management services today including:  1. CCM service includes personalized support from designated clinical staff supervised by her physician, including individualized plan of care and coordination with other care providers 2. 24/7 contact phone numbers for assistance for urgent and routine care needs. 3. Standard insurance, coinsurance, copays and deductibles  apply for chronic care management only during months in which we provide at least 20 minutes of these services. Most insurances cover these services at 100%, however patients may be responsible for any copay, coinsurance and/or deductible if applicable. This service may help you avoid the need for  more expensive face-to-face services. 4. Only one practitioner may furnish and bill the service in a calendar month. 5. The patient may stop CCM services at any time (effective at the end of the month) by phone call to the office staff.  Patient agreed to services and verbal consent obtained.   The patient verbalized understanding of instructions, educational materials, and care plan provided today and agreed to receive a mailed copy of patient instructions, educational materials, and care plan.  Telephone follow up appointment with pharmacy team member scheduled for: 03/24/20 at 2:00 PM  [facility] [phone]

## 2020-03-07 ENCOUNTER — Ambulatory Visit (INDEPENDENT_AMBULATORY_CARE_PROVIDER_SITE_OTHER): Payer: Medicare Other | Admitting: Licensed Clinical Social Worker

## 2020-03-07 ENCOUNTER — Encounter: Payer: Self-pay | Admitting: Licensed Clinical Social Worker

## 2020-03-07 ENCOUNTER — Other Ambulatory Visit: Payer: Self-pay

## 2020-03-07 DIAGNOSIS — F411 Generalized anxiety disorder: Secondary | ICD-10-CM | POA: Diagnosis not present

## 2020-03-07 DIAGNOSIS — F331 Major depressive disorder, recurrent, moderate: Secondary | ICD-10-CM | POA: Diagnosis not present

## 2020-03-07 NOTE — Progress Notes (Signed)
Virtual Visit via Telephone Note  I connected with [name] on 03/07/20 at 10:00 AM EST by telephone and verified that I am speaking with the correct person using two identifiers.  Participating Parties Patient Provider  Location: Patient: Home Provider: Home Office   I discussed the limitations, risks, security and privacy concerns of performing an evaluation and management service by telephone and the availability of in person appointments. I also discussed with the patient that there may be a patient responsible charge related to this service. The patient expressed understanding and agreed to proceed.   THERAPY PROGRESS NOTE  Session Time: 60 Minutes  Participation Level: Active  Behavioral Response: AlertAnxious, Depressed and Dysphoric  Type of Therapy: Individual Therapy  Treatment Goals addressed: Anxiety and Coping  Interventions: CBT  Summary: Tricia [name] is a 60 y.o. female who presents with depression and anxiety sxs. Pt returned to therapy after several month gap w/ last session held in October. Pt reported feeling "stressed" and that "Prozac was making me sick so I stopped taking it". Pt identified current new stressors including failing health of her long-term boyfriend and believes "it is only a matter of time" until he passes. Pt reported they have discussed end of life matters and is hopeful that he can be discharged from the hospital to come back home for continued palliative care. Pt reported other stressors include her relationships with her adult children. Pt reported she is worried about the mental health of her closest daughter s/ "I feel like that she should be doing better than this". Pt acknowledged that her communication style is "really unfiltered and it is not a good thing". Pt reported to cope when "overwhelmed, I just shut everyone out" and disengages socially.  Suicidal/Homicidal: No  Therapist Response: Therapist met with  patient for follow up session. Therapist and patient reviewed progress and continued barriers towards goals for 3 month treatment plan update. Therapist provided psychoeducation around establishing boundaries with family members suffering from mental health and substance abuse issues. Pt was receptive.   Plan: Return again in 2 weeks.  Diagnosis: Axis I: Generalized Anxiety Disorder and MDD, Recurrent, Moderate    Axis II: N/A  Follow Up Instructions:   I discussed the treatment plan update with the patient. The patient was provided an opportunity to ask questions and all were answered. The patient agreed with the plan and demonstrated an understanding of the instructions.   The patient was advised to call back or seek an in-person evaluation if the symptoms worsen or if the condition fails to improve as anticipated.  I provided 45 minutes of non-face-to-face time during this encounter.   Josephine Igo, LCSW, LCAS 03/07/20

## 2020-03-17 ENCOUNTER — Telehealth: Payer: Self-pay

## 2020-03-17 NOTE — Progress Notes (Signed)
  Chronic Care Management Pharmacy Assistant   Name: Tricia [name]  MRN: [id number] DOB: 10/04/1960  Reason for Encounter: Medication Review   PCP : Sowles, Krichna, MD  Allergies:   Allergies  Allergen Reactions  . Augmentin [Amoxicillin-Pot Clavulanate] Diarrhea  . Penicillins Itching    Medications: Outpatient Encounter Medications as of 03/17/2020  Medication Sig Note  . albuterol (VENTOLIN HFA) 108 (90 Base) MCG/ACT inhaler INHALE 1 PUFF BY MOUTH AS NEEDED   . blood glucose meter kit and supplies KIT Dispense accuchek aviva plus; can change if needed e11.9   . budesonide-formoterol (SYMBICORT) 160-4.5 MCG/ACT inhaler Inhale 2 puffs into the lungs in the morning and at bedtime. 03/04/2020: Prescribed by Dr. Flemming  . busPIRone (BUSPAR) 10 MG tablet Take 1 tablet (10 mg total) by mouth 3 (three) times daily.   . carbidopa-levodopa (SINEMET IR) 25-100 MG tablet Take 1.5 tablets by mouth 3 (three) times daily. 03/04/2020: Prescribed by Dr. Shah  . cetirizine (ZYRTEC) 10 MG tablet Take 10 mg by mouth daily as needed for allergies.   . colchicine (COLCRYS) 0.6 MG tablet Take 1 tablet (0.6 mg total) by mouth daily. (Patient not taking: Reported on 03/02/2020) 03/02/2020: Only takes PRN  . cyclobenzaprine (FLEXERIL) 10 MG tablet Take 10 mg by mouth 3 (three) times daily.  03/04/2020: Prescribed by Byeol Henson, PA-C  . furosemide (LASIX) 40 MG tablet Take 1 tablet (40 mg total) by mouth daily as needed.   . gabapentin (NEURONTIN) 600 MG tablet Take 1 tablet (600 mg total) by mouth 3 (three) times daily. 03/04/2020: Prescribed by Dr. Shah  . glucose blood test strip Use as directed to check blood glucose daily   . HUMALOG KWIKPEN 100 UNIT/ML KwikPen INJECT SUBCUTANEOUSLY 5 TO  10 UNITS 3 TIMES DAILY (Patient not taking: Reported on 03/02/2020)   . hydrOXYzine (ATARAX/VISTARIL) 25 MG tablet Take 25 mg by mouth 2 (two) times daily.   . hydrOXYzine (VISTARIL) 50 MG capsule TAKE 1  CAPSULE BY MOUTH TWICE DAILY AS NEEDED FOR SEVERE ANXIETY SYMPTOMS   . insulin glargine (LANTUS SOLOSTAR) 100 UNIT/ML Solostar Pen Inject 20 Units into the skin daily. (Patient not taking: Reported on 03/02/2020)   . Insulin Pen Needle 32G X 6 MM MISC 2 each by Does not apply route daily.   . ipratropium-albuterol (DUONEB) 0.5-2.5 (3) MG/3ML SOLN Inhale 3 mLs into the lungs every 6 (six) hours as needed.   . lidocaine (LIDODERM) 5 % 1 patch every 12 (twelve) hours as needed. 03/04/2020: Prescribed by Byeol Henson, PA-C  . lubiprostone (AMITIZA) 24 MCG capsule Take by mouth 2 (two) times daily as needed. 03/04/2020: Prescribed by Byeol Henson, PA-C  . metFORMIN (GLUCOPHAGE-XR) 750 MG 24 hr tablet Take 2 tablets (1,500 mg total) by mouth daily with breakfast. (Patient taking differently: Take 750 mg by mouth daily with breakfast.)   . montelukast (SINGULAIR) 10 MG tablet TAKE 1 TABLET BY MOUTH  DAILY IN THE AFTERNOON   . morphine (MS CONTIN) 30 MG 12 hr tablet Take 30 mg by mouth every 12 (twelve) hours. 03/04/2020: Prescribed by Byeol Henson, PA-C  . naproxen sodium (ALEVE) 220 MG tablet Take 220 mg by mouth daily as needed.   . omeprazole (PRILOSEC) 40 MG capsule Take 1 capsule (40 mg total) by mouth daily.   . Oxycodone HCl 10 MG TABS Take 10 mg by mouth 4 (four) times daily as needed.  03/04/2020: Prescribed by Byeol Henson, PA-C  . promethazine (PHENERGAN) 25 MG   tablet Take 25 mg by mouth daily as needed. 03/04/2020: Prescribed by Dr. Manuella Ghazi  . rosuvastatin (CRESTOR) 5 MG tablet Take 1 tablet (5 mg total) by mouth at bedtime.   . theophylline (UNIPHYL) 400 MG 24 hr tablet Take 1 tablet by mouth daily.  03/04/2020: Prescribed by Dr. Vella Kohler  . tiotropium (SPIRIVA) 18 MCG inhalation capsule Place 1 capsule into inhaler and inhale daily. pm 03/04/2020: Prescribed by Dr. Vella Kohler  . traZODone (DESYREL) 50 MG tablet TAKE 1 TABLET BY MOUTH AT  BEDTIME   . Ubrogepant (UBRELVY) 100 MG TABS Take 100 mg by mouth  daily as needed. May repeat in 2 hours 03/04/2020: Prescribed by Dr. Manuella Ghazi   No facility-administered encounter medications on file as of 03/17/2020.    Current Diagnosis: Patient Active Problem List   Diagnosis Date Noted  . History of prolonged Q-T interval on ECG 11/18/2019  . GAD (generalized anxiety disorder) 10/13/2019  . MDD (major depressive disorder), recurrent episode, moderate ([place]) 10/13/2019  . At risk for long QT syndrome 10/13/2019  . Nonrheumatic mitral valve regurgitation 05/04/2019  . Benign neoplasm of descending colon   . Polyp of sigmoid colon   . 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 Polyneuropathy 10/21/2014  . Chronic venous insufficiency 10/05/2014  . Bilateral leg edema 08/16/2014  . Major depression in partial remission ([place]) 08/16/2014  . Acid reflux 08/16/2014  . 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 Asthma, moderate persistent 08/16/2014  . Carpal tunnel syndrome 08/16/2014  . Cervical pain 08/16/2014  . CAFL (chronic airflow limitation) ([place]) 08/16/2014  . Type 2 diabetes mellitus with peripheral neuropathy ([place]) 08/16/2014  . Diabetes mellitus type 2, insulin dependent ([place]) 08/16/2014  . Dyslipidemia 08/16/2014  . Tobacco use disorder 08/16/2014  . Essential (primary) hypertension 08/16/2014  . Benign neoplasm of stomach 08/16/2014  . Gout 08/16/2014  . HLD (hyperlipidemia) 08/16/2014  . Low back pain 08/16/2014  . Lumbar radiculopathy 08/16/2014  . Headache, migraine 08/16/2014  . Arthralgia of multiple joints 08/16/2014  . Avitaminosis D 08/16/2014  . Primary osteoarthritis of both knees 06/22/2014  . Benign essential tremor 10/02/2013  . Cervical dystonia 10/02/2013  . Chronic diastolic heart failure ([place]) 11/16/2012  . Chronic pain 11/13/2012    Goals Addressed   None    Reviewed chart and adherence measures. Per insurance data patient is 70-79 % non adherent to rosuvastatin for cholesterol . Patient does  not meet goal  for rosuvastatin, will notify Junius Argyle, Clinical pharmacist.    Follow-Up:  Pharmacist Review   Anderson [place] Clinical Pharmacist Assistant [phone]

## 2020-03-18 ENCOUNTER — Other Ambulatory Visit: Payer: Self-pay | Admitting: *Deleted

## 2020-03-18 DIAGNOSIS — Z87891 Personal history of nicotine dependence: Secondary | ICD-10-CM

## 2020-03-18 DIAGNOSIS — Z122 Encounter for screening for malignant neoplasm of respiratory organs: Secondary | ICD-10-CM

## 2020-03-18 NOTE — Progress Notes (Signed)
Contacted and scheduled for annual lung screening scan. Patient is a current smoker with a 42 pack year history.

## 2020-03-21 ENCOUNTER — Ambulatory Visit (INDEPENDENT_AMBULATORY_CARE_PROVIDER_SITE_OTHER): Payer: Medicare Other | Admitting: Licensed Clinical Social Worker

## 2020-03-21 ENCOUNTER — Encounter: Payer: Self-pay | Admitting: Licensed Clinical Social Worker

## 2020-03-21 ENCOUNTER — Other Ambulatory Visit: Payer: Self-pay

## 2020-03-21 DIAGNOSIS — F331 Major depressive disorder, recurrent, moderate: Secondary | ICD-10-CM | POA: Diagnosis not present

## 2020-03-21 DIAGNOSIS — F411 Generalized anxiety disorder: Secondary | ICD-10-CM

## 2020-03-21 NOTE — Progress Notes (Signed)
Virtual Visit via Telephone Note  I connected with Tricia [name] on 03/21/20 at 10:00 AM EST by telephone and verified that I am speaking with the correct person using two identifiers.  Participating Parties Patient Provider  Location: Patient: Home  Provider: Home Office   I discussed the limitations, risks, security and privacy concerns of performing an evaluation and management service by telephone and the availability of in person appointments. I also discussed with the patient that there may be a patient responsible charge related to this service. The patient expressed understanding and agreed to proceed. THERAPY PROGRESS NOTE  Session Time: 30 Minutes  Participation Level: Active  Behavioral Response: AlertDepressed  Type of Therapy: Individual Therapy  Treatment Goals addressed:  Identify major life conflicts - met Increase understanding of beliefs and messages - ongoing Learn and implement new strategies - ongoing  Interventions: CBT  Summary: Tricia [name] is a 59 y.o. female who presents with depression and anxiety sxs. Pt reported her significant other is back home from the hospital and "is doing better than he was before he went in". Pt reported she has come to a place of acceptance around what she does and does not have control over to some degree around this. Pt acknowledged continued difficulty socially engaging with supports and ties this to having to be self-reliant from a young age as well as not having healthy relationships with mother and ex-husband who struggled with addiction. Pt reported having difficulty understanding her daughter at times and does not like to be "involved in the drama". Pt acknowledged importance of setting healthy boundaries.   Suicidal/Homicidal: No  Therapist Response: Therapist met with patient for follow up session. Therapist and patient explored early attachments and impact on current beliefs/messages about self and  others. Therapist and patient discussed how these factors play out in family dynamics to increase understanding and insights. Therapist validated patient feelings/concerns.  Plan: Return again in 2 weeks.  Diagnosis: Axis I: Generalized Anxiety Disorder and MDD, Recurent, Moderate    Axis II: N/A  Josephine Igo, LCSW, LCAS 03/21/2020

## 2020-03-23 ENCOUNTER — Other Ambulatory Visit: Payer: Self-pay

## 2020-03-23 ENCOUNTER — Telehealth (INDEPENDENT_AMBULATORY_CARE_PROVIDER_SITE_OTHER): Payer: Medicare Other | Admitting: Psychiatry

## 2020-03-23 ENCOUNTER — Telehealth: Payer: Self-pay

## 2020-03-23 ENCOUNTER — Encounter: Payer: Self-pay | Admitting: Psychiatry

## 2020-03-23 DIAGNOSIS — F17200 Nicotine dependence, unspecified, uncomplicated: Secondary | ICD-10-CM | POA: Diagnosis not present

## 2020-03-23 DIAGNOSIS — F331 Major depressive disorder, recurrent, moderate: Secondary | ICD-10-CM

## 2020-03-23 DIAGNOSIS — F411 Generalized anxiety disorder: Secondary | ICD-10-CM

## 2020-03-23 MED ORDER — TRAZODONE HCL 100 MG PO TABS: 100.0000 mg | ORAL_TABLET | Freq: Every day | ORAL | 0 refills | Status: DC

## 2020-03-23 MED ORDER — DULOXETINE HCL 30 MG PO CPEP: 30.0000 mg | ORAL_CAPSULE | Freq: Every day | ORAL | 0 refills | Status: DC

## 2020-03-23 NOTE — Progress Notes (Signed)
Virtual Visit via Video Note  I connected with Tricia [name] on 03/23/20 at  3:20 PM EST by a video enabled telemedicine application and verified that I am speaking with the correct person using two identifiers.  Location Provider Location : [facility] Patient Location : Home  Participants: Patient , Provider   I discussed the limitations of evaluation and management by telemedicine and the availability of in person appointments. The patient expressed understanding and agreed to proceed.   I discussed the assessment and treatment plan with the patient. The patient was provided an opportunity to ask questions and all were answered. The patient agreed with the plan and demonstrated an understanding of the instructions.   The patient was advised to call back or seek an in-person evaluation if the symptoms worsen or if the condition fails to improve as anticipated.   [place] MD OP Progress Note  03/23/2020 5:23 PM Tricia [name]  MRN:  [id number]  Chief Complaint:  Chief Complaint    Follow-up     HPI: Tricia [name] is a 60 year old Caucasian female on disability, divorced, lives at [facility], has a history of MDD, GAD, tobacco use disorder, carpal tunnel syndrome, atypical Parkinson's disease, polyneuropathy, cervical dystonia, migraine headaches, COPD, diastolic dysfunction, diabetes melitis, asthma, degenerative disc disease was evaluated by telemedicine today.  Patient today reports she is currently struggling with several situational stressors.  She reports her daughter is currently struggling with mental health problems and is not getting the help that she needs.  She is also going through a divorce.  Patient reports this just makes her anxious and sad.  She reports she wants to get her daughter the right help.  Patient reports she did not tolerate the Prozac and the BuSpar and became nauseous when she tried taking the higher dosage.  She has tapered herself off of  these medications.  Patient reports she continues to struggle with sadness, low motivation, nervousness, restlessness and so on.  She constantly worries about different things.  She is unable to relax often.  Patient denies any suicidality, homicidality or perceptual disturbances.  She reports sleep is restless.  However trazodone does help to some extent.  Patient denies any other concerns today.    Visit Diagnosis:    ICD-10-CM   1. GAD (generalized anxiety disorder)  F41.1 traZODone (DESYREL) 100 MG tablet    DULoxetine (CYMBALTA) 30 MG capsule  2. MDD (major depressive disorder), recurrent episode, moderate (HCC)  F33.1 traZODone (DESYREL) 100 MG tablet    DULoxetine (CYMBALTA) 30 MG capsule  3. Tobacco use disorder  F17.200     Past Psychiatric History: I have reviewed past psychiatric history from my progress note on 10/13/2019.  Past trials of Cymbalta, sertraline, Lexapro, Xanax, Effexor, Prozac, BuSpar  Past Medical History:  Past Medical History:  Diagnosis Date  . Anxiety   . Arthritis    joints and hands/ knees  . Asthma    uses inhaler  . Benign essential tremor    head  . Cervical dystonia    neck pain  . Cholesteatoma of left ear    x2  . COPD (chronic obstructive pulmonary disease) ([place])   . Cough   . Depression   . Diabetes mellitus without complication ([place])    type 2  . Diastolic dysfunction   . Dyspnea   . Dysrhythmia    diastolic dysfunction  . GERD (gastroesophageal reflux disease)   . Headache    migraines/ one per week  .  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 Hyperlipidemia   . Hypertension   . Motion sickness    boat  . Neuromuscular disorder (HCC)    neuropathy feet and hands( nerve damage)  . Wears dentures    upper and lower    Past Surgical History:  Procedure Laterality Date  . CARPAL TUNNEL RELEASE Bilateral    x2 right, 1x on left  . COLONOSCOPY    . COLONOSCOPY WITH PROPOFOL N/A 04/25/2017   Procedure:  COLONOSCOPY WITH PROPOFOL;  Surgeon: [name], MD;  Location: [facility];  Service: Endoscopy;  Laterality: N/A;  diabetic-oral med  . DILATION AND CURETTAGE OF UTERUS    . EXTERNAL EAR SURGERY Left    x2  . POLYPECTOMY  04/25/2017   Procedure: POLYPECTOMY INTESTINAL;  Surgeon: [name], MD;  Location: [facility];  Service: Endoscopy;;  . SPINE SURGERY     herniated disc  . TUBAL LIGATION      Family Psychiatric History: I have reviewed family psychiatric history from my progress note on 10/13/2019  Family History:  Family History  Problem Relation Age of Onset  . Emphysema Mother   . Anxiety disorder Mother   . Stroke Father   . Throat cancer Father   . Multiple sclerosis Daughter   . Bipolar disorder Daughter   . Cervical cancer Daughter   . Bipolar disorder Daughter   . Drug abuse Daughter   . Lung cancer Maternal Aunt   . Lung cancer Maternal Uncle   . Lung cancer Maternal Grandmother   . Lung cancer Maternal Grandfather     Social History: I have reviewed social history from my progress note on 10/13/2019 Social History   Socioeconomic History  . Marital status: Divorced    Spouse name: Not on file  . Number of children: 2  . Years of education: Not on file  . Highest education level: Associate degree: academic program  Occupational History  . Occupation: Disability  Tobacco Use  . Smoking status: Current Every Day Smoker    Packs/day: 1.00    Years: 41.00    Pack years: 41.00    Types: Cigarettes    Start date: 05/19/1977  . Smokeless tobacco: Never Used  Vaping Use  . Vaping Use: Former  Substance and Sexual Activity  . Alcohol use: No    Alcohol/week: 0.0 standard drinks  . Drug use: No  . Sexual activity: Not Currently    Birth control/protection: None  Other Topics Concern  . Not on file  Social History Narrative   Lives with her boyfriend, she has two daughters.    Social Determinants of Health   Financial Resource  Strain: Low Risk   . Difficulty of Paying Living Expenses: Not hard at all  Food Insecurity: No Food Insecurity  . Worried About Charity fundraiser in the Last Year: Never true  . Ran Out of Food in the Last Year: Never true  Transportation Needs: No Transportation Needs  . Lack of Transportation (Medical): No  . Lack of Transportation (Non-Medical): No  Physical Activity: Inactive  . Days of Exercise per Week: 0 days  . Minutes of Exercise per Session: 0 min  Stress: Stress Concern Present  . Feeling of Stress : Rather much  Social Connections: Moderately Isolated  . Frequency of Communication with Friends and Family: More than three times a week  . Frequency of Social Gatherings with Friends and Family: Once  a week  . Attends Religious Services: Never  . Active Member of Clubs or Organizations: No  . Attends Archivist Meetings: Never  . Marital Status: Living with partner    Allergies:  Allergies  Allergen Reactions  . Augmentin [Amoxicillin-Pot Clavulanate] Diarrhea  . Penicillins Itching    Metabolic Disorder Labs: Lab Results  Component Value Date   HGBA1C 6.9 (A) 12/07/2019   MPG 148 08/05/2019   MPG 146 08/27/2018   No results found for: PROLACTIN Lab Results  Component Value Date   CHOL 148 08/05/2019   TRIG 193 (H) 08/05/2019   HDL 39 (L) 08/05/2019   CHOLHDL 3.8 08/05/2019   VLDL 34 10/09/2012   LDLCALC 80 08/05/2019   LDLCALC 50 10/29/2017   Lab Results  Component Value Date   TSH 0.31 (L) 08/05/2019   TSH 1.380 10/04/2014    Therapeutic Level Labs: No results found for: LITHIUM No results found for: VALPROATE No components found for:  CBMZ  Current Medications: Current Outpatient Medications  Medication Sig Dispense Refill  . DULoxetine (CYMBALTA) 30 MG capsule Take 1 capsule (30 mg total) by mouth daily. 90 capsule 0  . traZODone (DESYREL) 100 MG tablet Take 1 tablet (100 mg total) by mouth at bedtime. 90 tablet 0  . albuterol  (VENTOLIN HFA) 108 (90 Base) MCG/ACT inhaler INHALE 1 PUFF BY MOUTH AS NEEDED 18 g 1  . blood glucose meter kit and supplies KIT Dispense accuchek aviva plus; can change if needed e11.9 1 each 0  . budesonide-formoterol (SYMBICORT) 160-4.5 MCG/ACT inhaler Inhale 2 puffs into the lungs in the morning and at bedtime.    . carbidopa-levodopa (SINEMET IR) 25-100 MG tablet Take 1.5 tablets by mouth 3 (three) times daily.    . cetirizine (ZYRTEC) 10 MG tablet Take 10 mg by mouth daily as needed for allergies.    Marland Kitchen colchicine (COLCRYS) 0.6 MG tablet Take 1 tablet (0.6 mg total) by mouth daily. (Patient not taking: Reported on 03/02/2020) 30 tablet 0  . cyclobenzaprine (FLEXERIL) 10 MG tablet Take 10 mg by mouth 3 (three) times daily.     . furosemide (LASIX) 40 MG tablet Take 1 tablet (40 mg total) by mouth daily as needed. 30 tablet 2  . gabapentin (NEURONTIN) 600 MG tablet Take 1 tablet (600 mg total) by mouth 3 (three) times daily. 90 tablet 3  . glucose blood test strip Use as directed to check blood glucose daily 100 each 2  . HUMALOG KWIKPEN 100 UNIT/ML KwikPen INJECT SUBCUTANEOUSLY 5 TO  10 UNITS 3 TIMES DAILY (Patient not taking: Reported on 03/02/2020) 30 mL 2  . hydrOXYzine (ATARAX/VISTARIL) 25 MG tablet Take 25 mg by mouth 2 (two) times daily.    . hydrOXYzine (VISTARIL) 50 MG capsule TAKE 1 CAPSULE BY MOUTH TWICE DAILY AS NEEDED FOR SEVERE ANXIETY SYMPTOMS 60 capsule 1  . insulin glargine (LANTUS SOLOSTAR) 100 UNIT/ML Solostar Pen Inject 20 Units into the skin daily. (Patient not taking: Reported on 03/02/2020) 15 pen 1  . Insulin Pen Needle 32G X 6 MM MISC 2 each by Does not apply route daily. 200 each 2  . ipratropium-albuterol (DUONEB) 0.5-2.5 (3) MG/3ML SOLN Inhale 3 mLs into the lungs every 6 (six) hours as needed. 360 mL 3  . lidocaine (LIDODERM) 5 % 1 patch every 12 (twelve) hours as needed.    . lubiprostone (AMITIZA) 24 MCG capsule Take by mouth 2 (two) times daily as needed.    .  metFORMIN (  GLUCOPHAGE-XR) 750 MG 24 hr tablet Take 2 tablets (1,500 mg total) by mouth daily with breakfast. (Patient taking differently: Take 750 mg by mouth daily with breakfast.) 180 tablet 1  . montelukast (SINGULAIR) 10 MG tablet TAKE 1 TABLET BY MOUTH  DAILY IN THE AFTERNOON 90 tablet 2  . morphine (MS CONTIN) 30 MG 12 hr tablet Take 30 mg by mouth every 12 (twelve) hours.    . naproxen sodium (ALEVE) 220 MG tablet Take 220 mg by mouth daily as needed.    Marland Kitchen omeprazole (PRILOSEC) 40 MG capsule Take 1 capsule (40 mg total) by mouth daily. 90 capsule 1  . Oxycodone HCl 10 MG TABS Take 10 mg by mouth 4 (four) times daily as needed.     . promethazine (PHENERGAN) 25 MG tablet Take 25 mg by mouth daily as needed.    . rosuvastatin (CRESTOR) 5 MG tablet Take 1 tablet (5 mg total) by mouth at bedtime. 90 tablet 1  . theophylline (UNIPHYL) 400 MG 24 hr tablet Take 1 tablet by mouth daily.     Marland Kitchen tiotropium (SPIRIVA) 18 MCG inhalation capsule Place 1 capsule into inhaler and inhale daily. pm    . Ubrogepant (UBRELVY) 100 MG TABS Take 100 mg by mouth daily as needed. May repeat in 2 hours     No current facility-administered medications for this visit.     Musculoskeletal: Strength & Muscle Tone: UTA Gait & Station: UTA Patient leans: N/A  Psychiatric Specialty Exam: Review of Systems  Psychiatric/Behavioral: Positive for dysphoric mood and sleep disturbance. The patient is nervous/anxious.   All other systems reviewed and are negative.   There were no vitals taken for this visit.There is no height or weight on file to calculate BMI.  General Appearance: Casual  Eye Contact:  Fair  Speech:  Clear and Coherent  Volume:  Normal  Mood:  Anxious and Depressed  Affect:  Congruent  Thought Process:  Goal Directed and Descriptions of Associations: Intact  Orientation:  Full (Time, Place, and Person)  Thought Content: Logical   Suicidal Thoughts:  No  Homicidal Thoughts:  No  Memory:   Immediate;   Fair Recent;   Fair Remote;   Fair  Judgement:  Fair  Insight:  Fair  Psychomotor Activity:  Normal  Concentration:  Concentration: Fair and Attention Span: Fair  Recall:  [employer] of Knowledge: Fair  Language: Fair  Akathisia:  No  Handed:  Right  AIMS (if indicated): UTA  Assets:  Communication Skills Desire for Improvement Housing Social Support Transportation  ADL's:  Intact  Cognition: WNL  Sleep:  Poor   Screenings: GAD-7   Flowsheet Row Office Visit from 03/19/2018 in [facility] Office Visit from 01/30/2018 in [facility] Office Visit from 10/29/2017 in [facility]  Total GAD-7 Score _0 PHQ2-9   [place] from 01/28/2020 in [facility] Office Visit from 12/07/2019 in [facility] Office Visit from 10/14/2019 in [facility] Office Visit from 08/05/2019 in [facility] Office Visit from 05/04/2019 in [facility]  PHQ-2 Total Score _1 PHQ-9 Total Score _2 Assessment and Plan: Tricia [name] is a 60 year old Caucasian female on disability, lives in [place], has a history of MDD, GAD, degenerative disc disease, diastolic dysfunction, diabetes melitis, migraine  headaches, atypical Parkinson's disease was evaluated by telemedicine today.  Patient is currently struggling with anxiety and depressive symptoms as well as sleep problems and will benefit from the following plan.  Plan MDD-unstable Discontinue Prozac and BuSpar for noncompliance and side effects. Start Cymbalta 30 mg p.o. daily Increase trazodone to 100 mg p.o. nightly. Continue CBT with Ms. Lauren Pernell Dupre  GAD-unstable Discontinue Prozac and BuSpar Start Cymbalta 30 mg p.o. daily Hydroxyzine 50 mg p.o. twice daily as needed for severe anxiety attacks Continue CBT   Tobacco  use disorder-unstable Provided counseling  Follow-up in clinic in 2 to 3 weeks or sooner if needed.  I have spent atleast 20 minutes face to face by video with patient today. More than 50 % of the time was spent for preparing to see the patient ( e.g., review of test, records ),  ordering medications and test ,psychoeducation and supportive psychotherapy and care coordination,as well as documenting clinical information in electronic health record. This note was generated in part or whole with voice recognition software. Voice recognition is usually quite accurate but there are transcription errors that can and very often do occur. I apologize for any typographical errors that were not detected and corrected.       Ursula Alert, MD 03/24/2020, 8:41 AM

## 2020-03-23 NOTE — Chronic Care Management (AMB) (Signed)
03/23/2020- Called patient to remind of appointment with Junius Argyle, CPP on 03/24/2020 at 10:00 am. No answer on home number,no voicemail. Called mobile number no answer, voicemail box not set up yet. Unable to leave a message regarding appointment. Junius Argyle, CPP notified.  Pattricia Boss, [place] Pharmacist Assistant [phone]

## 2020-03-23 NOTE — Patient Instructions (Signed)
Duloxetine Delayed-Release Capsules What is this medicine? DULOXETINE (doo LOX e teen) is used to treat depression, anxiety, and different types of chronic pain. This medicine may be used for other purposes; ask your health care provider or pharmacist if you have questions. COMMON BRAND NAME(S): Cymbalta, Creig Hines, Irenka What should I tell my health care provider before I take this medicine? They need to know if you have any of these conditions:  bipolar disorder  glaucoma  high blood pressure  kidney disease  liver disease  seizures  suicidal thoughts, plans or attempt; a previous suicide attempt by you or a family member  take medicines that treat or prevent blood clots  taken medicines called MAOIs like Carbex, Eldepryl, Marplan, Nardil, and Parnate within 14 days  trouble passing urine  an unusual reaction to duloxetine, other medicines, foods, dyes, or preservatives  pregnant or trying to get pregnant  breast-feeding How should I use this medicine? Take this medicine by mouth with a glass of water. Follow the directions on the prescription label. Do not crush, cut or chew some capsules of this medicine. Some capsules may be opened and sprinkled on applesauce. Check with your doctor or pharmacist if you are not sure. You can take this medicine with or without food. Take your medicine at regular intervals. Do not take your medicine more often than directed. Do not stop taking this medicine suddenly except upon the advice of your doctor. Stopping this medicine too quickly may cause serious side effects or your condition may worsen. A special MedGuide will be given to you by the pharmacist with each prescription and refill. Be sure to read this information carefully each time. Talk to your pediatrician regarding the use of this medicine in children. While this drug may be prescribed for children as young as 60 years of age for selected conditions, precautions do  apply. Overdosage: If you think you have taken too much of this medicine contact a poison control center or emergency room at once. NOTE: This medicine is only for you. Do not share this medicine with others. What if I miss a dose? If you miss a dose, take it as soon as you can. If it is almost time for your next dose, take only that dose. Do not take double or extra doses. What may interact with this medicine? Do not take this medicine with any of the following medications:  desvenlafaxine  levomilnacipran  linezolid  MAOIs like Carbex, Eldepryl, Emsam, Marplan, Nardil, and Parnate  methylene blue (injected into a vein)  milnacipran  safinamide  thioridazine  venlafaxine  viloxazine This medicine may also interact with the following medications:  alcohol  amphetamines  aspirin and aspirin-like medicines  certain antibiotics like ciprofloxacin and enoxacin  certain medicines for blood pressure, heart disease, irregular heart beat  certain medicines for depression, anxiety, or psychotic disturbances  certain medicines for migraine headache like almotriptan, eletriptan, frovatriptan, naratriptan, rizatriptan, sumatriptan, zolmitriptan  certain medicines that treat or prevent blood clots like warfarin, enoxaparin, and dalteparin  cimetidine  fentanyl  lithium  NSAIDS, medicines for pain and inflammation, like ibuprofen or naproxen  phentermine  procarbazine  rasagiline  sibutramine  St. John's wort  theophylline  tramadol  tryptophan This list may not describe all possible interactions. Give your health care provider a list of all the medicines, herbs, non-prescription drugs, or dietary supplements you use. Also tell them if you smoke, drink alcohol, or use illegal drugs. Some items may interact with your medicine. What  should I watch for while using this medicine? Tell your doctor if your symptoms do not get better or if they get worse. Visit your  doctor or healthcare provider for regular checks on your progress. Because it may take several weeks to see the full effects of this medicine, it is important to continue your treatment as prescribed by your doctor. This medicine may cause serious skin reactions. They can happen weeks to months after starting the medicine. Contact your healthcare provider right away if you notice fevers or flu-like symptoms with a rash. The rash may be red or purple and then turn into blisters or peeling of the skin. Or, you might notice a red rash with swelling of the face, lips, or lymph nodes in your neck or under your arms. Patients and their families should watch out for new or worsening thoughts of suicide or depression. Also watch out for sudden changes in feelings such as feeling anxious, agitated, panicky, irritable, hostile, aggressive, impulsive, severely restless, overly excited and hyperactive, or not being able to sleep. If this happens, especially at the beginning of treatment or after a change in dose, call your healthcare provider. You may get drowsy or dizzy. Do not drive, use machinery, or do anything that needs mental alertness until you know how this medicine affects you. Do not stand or sit up quickly, especially if you are an older patient. This reduces the risk of dizzy or fainting spells. Alcohol may interfere with the effect of this medicine. Avoid alcoholic drinks. This medicine can cause an increase in blood pressure. This medicine can also cause a sudden drop in your blood pressure, which may make you feel faint and increase the chance of a fall. These effects are most common when you first start the medicine or when the dose is increased, or during use of other medicines that can cause a sudden drop in blood pressure. Check with your doctor for instructions on monitoring your blood pressure while taking this medicine. Your mouth may get dry. Chewing sugarless gum or sucking hard candy, and drinking  plenty of water, may help. Contact your doctor if the problem does not go away or is severe. What side effects may I notice from receiving this medicine? Side effects that you should report to your doctor or health care professional as soon as possible:  allergic reactions like skin rash, itching or hives, swelling of the face, lips, or tongue  anxious  breathing problems  confusion  changes in vision  chest pain  confusion  elevated mood, decreased need for sleep, racing thoughts, impulsive behavior  eye pain  fast, irregular heartbeat  feeling faint or lightheaded, falls  feeling agitated, angry, or irritable  hallucination, loss of contact with reality  high blood pressure  loss of balance or coordination  palpitations  redness, blistering, peeling or loosening of the skin, including inside the mouth  restlessness, pacing, inability to keep still  seizures  stiff muscles  suicidal thoughts or other mood changes  trouble passing urine or change in the amount of urine  trouble sleeping  unusual bleeding or bruising  unusually weak or tired  vomiting  yellowing of the eyes or skin Side effects that usually do not require medical attention (report to your doctor or health care professional if they continue or are bothersome):  change in sex drive or performance  change in appetite or weight  constipation  dizziness  dry mouth  headache  increased sweating  nausea  tired  This list may not describe all possible side effects. Call your doctor for medical advice about side effects. You may report side effects to FDA at 1-800-FDA-1088. Where should I keep my medicine? Keep out of the reach of children and pets. Store at room temperature between 15 and 30 degrees C (59 to 86 degrees F). Get rid of any unused medicine after the expiration date. To get rid of medicines that are no longer needed or have expired:  Take the medicine to a medicine  take-back program. Check with your pharmacy or law enforcement to find a location.  If you cannot return the medicine, check the label or package insert to see if the medicine should be thrown out in the garbage or flushed down the toilet. If you are not sure, ask your health care provider. If it is safe to put it in the trash, take the medicine out of the container. Mix the medicine with cat litter, dirt, coffee grounds, or other unwanted substance. Seal the mixture in a bag or container. Put it in the trash. NOTE: This sheet is a summary. It may not cover all possible information. If you have questions about this medicine, talk to your doctor, pharmacist, or health care provider.  2021 Elsevier/Gold Standard (2019-12-24 16:06:16)

## 2020-03-24 ENCOUNTER — Ambulatory Visit (INDEPENDENT_AMBULATORY_CARE_PROVIDER_SITE_OTHER): Payer: Medicare Other

## 2020-03-24 ENCOUNTER — Telehealth: Payer: Self-pay

## 2020-03-24 DIAGNOSIS — Z794 Long term (current) use of insulin: Secondary | ICD-10-CM | POA: Diagnosis not present

## 2020-03-24 DIAGNOSIS — I10 Essential (primary) hypertension: Secondary | ICD-10-CM

## 2020-03-24 DIAGNOSIS — E119 Type 2 diabetes mellitus without complications: Secondary | ICD-10-CM

## 2020-03-24 NOTE — Chronic Care Management (AMB) (Signed)
Chronic Care Management Pharmacy  Name: Tricia [name]  MRN: [id number] DOB: 1960/03/12   Chief Complaint/ HPI  Tricia [name],  60 y.o. , female presents for her Initial CCM visit with the clinical pharmacist via telephone.  PCP : [name], MD Patient Care Team: [name], MD as PCP - General (Family Medicine) Erby Pian, MD as Consulting Physician (Pulmonary Disease) [name], MD as Consulting Physician (Neurology) Ubaldo Glassing Javier Docker, MD as Consulting Physician (Cardiology) Lonia Farber, MD as Consulting Physician (Internal Medicine) Neldon Labella, RN as Registered Nurse Michaelle Birks, Glean Salvo, [facility] (Pharmacist) Ursula Alert, MD as Consulting Physician (Psychiatry)  Patient's chronic conditions include: Hypertension, Hyperlipidemia, Diabetes, Heart Failure, GERD, COPD, Asthma, Depression, Anxiety, Osteoarthritis, Tobacco use and Chronic Pain, And Parkinson's Disease   Office Visits: 01/28/20: Patient presented to Clemetine Marker, LPN for AWV.  81/19/14: Patient presented to Dr. [name] for Follow-up. A1c stable at 6.9%.  Ajovy stopped.   Consult Visit: 03/23/20: Patient presented to Dr. [name] [facility]) for follow-up. Buspirone stopped. Patient started on Duloxetine 30 mg daily  02/24/20: Patient presented to Dr. [name] [facility]) for follow-up.  02/19/20: Patient presented to ED for right wrist pain.  01/25/20: Patient presented to Dr. Manuella Ghazi for Parkinson's follow-up. Sinemet increased to 1.5 tablets three times daily. Emgality discontinued.  01/18/20: Patient presented to Dr. [name] [facility]) for follow-up.  12/21/19: Patient presented to Dr. [name] [facility]) for follow-up. 12/01/19: Patient presented to Dr. Raul Del (Pulmonology) for follow-up. Patient with wheezing + green phlegm. Medrol dose pack + doxycycline started for 10 days. 44-monthfollow-up.    Subjective: Overall, patient reports she is  doing well. She still struggles with her pain and reports feeling overwhelmed with all of her different medications. She has also been feeling stressed because her boyfriend is currently hospitalized for a heart failure exacerbation.   Luckily, she reports she has been able to afford her medications as she is covered under the [employer] Program.   Objective: Allergies  Allergen Reactions  . Augmentin [Amoxicillin-Pot Clavulanate] Diarrhea  . Penicillins Itching    Medications: Outpatient Encounter Medications as of 03/24/2020  Medication Sig Note  . albuterol (VENTOLIN HFA) 108 (90 Base) MCG/ACT inhaler INHALE 1 PUFF BY MOUTH AS NEEDED   . blood glucose meter kit and supplies KIT Dispense accuchek aviva plus; can change if needed e11.9   . budesonide-formoterol (SYMBICORT) 160-4.5 MCG/ACT inhaler Inhale 2 puffs into the lungs in the morning and at bedtime. 03/04/2020: Prescribed by Dr. FVella Kohler . carbidopa-levodopa (SINEMET IR) 25-100 MG tablet Take 1.5 tablets by mouth 3 (three) times daily. 03/04/2020: Prescribed by Dr. SManuella Ghazi . cetirizine (ZYRTEC) 10 MG tablet Take 10 mg by mouth daily as needed for allergies.   .Marland Kitchencolchicine (COLCRYS) 0.6 MG tablet Take 1 tablet (0.6 mg total) by mouth daily. (Patient not taking: Reported on 03/02/2020) 03/02/2020: Only takes PRN  . cyclobenzaprine (FLEXERIL) 10 MG tablet Take 10 mg by mouth 3 (three) times daily.  03/04/2020: Prescribed by BFarris Has PA-C  . DULoxetine (CYMBALTA) 30 MG capsule Take 1 capsule (30 mg total) by mouth daily.   . furosemide (LASIX) 40 MG tablet Take 1 tablet (40 mg total) by mouth daily as needed.   . gabapentin (NEURONTIN) 600 MG tablet Take 1 tablet (600 mg total) by mouth 3 (three) times daily. 03/04/2020: Prescribed by Dr. SManuella Ghazi . glucose blood test strip Use as directed to check blood glucose daily   .  HUMALOG KWIKPEN 100 UNIT/ML KwikPen INJECT SUBCUTANEOUSLY 5 TO  10 UNITS 3 TIMES DAILY (Patient not taking: Reported  on 03/02/2020)   . hydrOXYzine (ATARAX/VISTARIL) 25 MG tablet Take 25 mg by mouth 2 (two) times daily.   . hydrOXYzine (VISTARIL) 50 MG capsule TAKE 1 CAPSULE BY MOUTH TWICE DAILY AS NEEDED FOR SEVERE ANXIETY SYMPTOMS   . insulin glargine (LANTUS SOLOSTAR) 100 UNIT/ML Solostar Pen Inject 20 Units into the skin daily. (Patient not taking: Reported on 03/02/2020) 03/24/2020: Only taking PRN in the evenings if her sugars feel elevated  . Insulin Pen Needle 32G X 6 MM MISC 2 each by Does not apply route daily.   Marland Kitchen ipratropium-albuterol (DUONEB) 0.5-2.5 (3) MG/3ML SOLN Inhale 3 mLs into the lungs every 6 (six) hours as needed.   . lidocaine (LIDODERM) 5 % 1 patch every 12 (twelve) hours as needed. 03/04/2020: Prescribed by Farris Has, PA-C  . lubiprostone (AMITIZA) 24 MCG capsule Take by mouth 2 (two) times daily as needed. 03/04/2020: Prescribed by Farris Has, PA-C  . metFORMIN (GLUCOPHAGE-XR) 750 MG 24 hr tablet Take 2 tablets (1,500 mg total) by mouth daily with breakfast. (Patient taking differently: Take 750 mg by mouth daily with breakfast.)   . montelukast (SINGULAIR) 10 MG tablet TAKE 1 TABLET BY MOUTH  DAILY IN THE AFTERNOON   . morphine (MS CONTIN) 30 MG 12 hr tablet Take 30 mg by mouth every 12 (twelve) hours. 03/04/2020: Prescribed by Farris Has, PA-C  . naproxen sodium (ALEVE) 220 MG tablet Take 220 mg by mouth daily as needed.   Marland Kitchen omeprazole (PRILOSEC) 40 MG capsule Take 1 capsule (40 mg total) by mouth daily.   . Oxycodone HCl 10 MG TABS Take 10 mg by mouth 4 (four) times daily as needed.  03/04/2020: Prescribed by Farris Has, PA-C  . promethazine (PHENERGAN) 25 MG tablet Take 25 mg by mouth daily as needed. 03/04/2020: Prescribed by Dr. Manuella Ghazi  . rosuvastatin (CRESTOR) 5 MG tablet Take 1 tablet (5 mg total) by mouth at bedtime.   . theophylline (UNIPHYL) 400 MG 24 hr tablet Take 1 tablet by mouth daily.  03/04/2020: Prescribed by Dr. Vella Kohler  . tiotropium (SPIRIVA) 18 MCG inhalation capsule  Place 1 capsule into inhaler and inhale daily. pm 03/04/2020: Prescribed by Dr. Vella Kohler  . traZODone (DESYREL) 100 MG tablet Take 1 tablet (100 mg total) by mouth at bedtime.   Marland Kitchen Ubrogepant (UBRELVY) 100 MG TABS Take 100 mg by mouth daily as needed. May repeat in 2 hours 03/04/2020: Prescribed by Dr. Manuella Ghazi   No facility-administered encounter medications on file as of 03/24/2020.    Wt Readings from Last 3 Encounters:  01/28/20 174 lb 1.6 oz (79 kg)  12/07/19 172 lb (78 kg)  10/14/19 172 lb 4.8 oz (78.2 kg)    Lab Results  Component Value Date   CREATININE 0.85 08/05/2019   BUN 10 08/05/2019   GFRNONAA 76 08/05/2019   GFRAA 88 08/05/2019   NA 136 08/05/2019   K 4.0 08/05/2019   CALCIUM 8.9 08/05/2019   CO2 26 08/05/2019   Current Diagnosis/Assessment:  SDOH Interventions   Flowsheet Row Most Recent Value  SDOH Interventions   Financial Strain Interventions Intervention Not Indicated  Transportation Interventions Intervention Not Indicated      Goals Addressed            This Visit's Progress   . Chronic Care Management       CARE PLAN ENTRY (see longitudinal plan of care  for additional care plan information)  Current Barriers:  . Chronic Disease Management support, education, and care coordination needs related to Hypertension, Hyperlipidemia, Diabetes, Heart Failure, GERD, COPD, Asthma, Depression, Anxiety, Osteoarthritis, Tobacco use and Chronic Pain, And Parkinson's Disease    Hypertension BP Readings from Last 3 Encounters:  02/19/20 116/73  01/28/20 118/72  12/07/19 100/60   . Pharmacist Clinical Goal(s): o Over the next 90 days, patient will work with PharmD and providers to maintain BP goal <130/80 . Current regimen:  o Furosemide 40 mg daily PRN  . Interventions: o Discussed low salt diet and exercising as tolerated extensively o Will initiate blood pressure monitoring plan  . Patient self care activities - Over the next 90 days, patient will: o Check  blood pressure when symptomatic, document, and provide at future appointments o Ensure daily salt intake < 2300 mg/day  Hyperlipidemia Lab Results  Component Value Date/Time   LDLCALC 80 08/05/2019 03:04 PM   [place] 85 10/09/2012 04:52 AM   . Pharmacist Clinical Goal(s): o Over the next 90 days, patient will work with PharmD and providers to maintain LDL goal < 100 . Current regimen:  o Rosuvastatin 5 mg daily  . Interventions: o Discussed low cholesterol diet and exercising as tolerated extensively o Will initiate cholesterol monitoring plan   Diabetes Lab Results  Component Value Date/Time   HGBA1C 6.9 (A) 12/07/2019 02:18 PM   HGBA1C 6.8 (H) 08/05/2019 03:04 PM   HGBA1C 6.7 (H) 08/27/2018 02:01 PM   HGBA1C 13.7 (A) 03/19/2018 02:04 PM   HGBA1C 14.0 (A) 10/29/2017 02:18 PM   HGBA1C 7.4 (H) 10/09/2012 04:52 AM   HGBA1C 7.1 (H) 07/02/2012 05:33 AM   . Pharmacist Clinical Goal(s): o Over the next 90 days, patient will work with PharmD and providers to maintain A1c goal <7% . Current regimen:  . Humalog 5-10 units three times daily (not taking) . Lantus 20 units daily (not taking) . Metformin XR 750 mg 2 tablets daily . Interventions: o Discussed carbohydrate counting and exercising as tolerated extensively o Will initiate blood sugar monitoring plan  . Patient self care activities - Over the next 90 days, patient will: o Check blood sugar once daily, document, and provide at future appointments o Contact provider with any episodes of hypoglycemia  Medication management . Pharmacist Clinical Goal(s): o Over the next 90 days, patient will work with PharmD and providers to maintain optimal medication adherence . Current pharmacy: [facility] . Interventions o Comprehensive medication review performed. o Continue current medication management strategy . Patient self care activities - Over the next 90 days, patient will: o Take medications as prescribed o Report any  questions or concerns to PharmD and/or provider(s)      Hypertension   BP goal is:  <130/80  Office blood pressures are  BP Readings from Last 3 Encounters:  02/19/20 116/73  01/28/20 118/72  12/07/19 100/60   Patient checks BP at home infrequently Patient home BP readings are ranging: NA  Patient has failed these meds in the past: NA Patient is currently controlled on the following medications:  . Furosemide 40 mg daily as needed  We discussed weighing daily; if you gain more than 3 pounds in one day or 5 pounds in one week call your doctor  Plan  Continue current medications   Heart Failure   Type: Diastolic  Last ejection fraction: 60-65% (2016)  NYHA Class: II (slight limitation of activity) AHA HF Stage: B (Heart disease present - no  symptoms present)  Patient has failed these meds in past: NA Patient is currently controlled on the following medications:   . Furosemide 40 mg daily as needed  We discussed weighing daily; if you gain more than 3 pounds in one day or 5 pounds in one week call your doctor  Plan  Continue current medications  Hyperlipidemia   LDL goal < 100  Last lipids Lab Results  Component Value Date   CHOL 148 08/05/2019   HDL 39 (L) 08/05/2019   LDLCALC 80 08/05/2019   TRIG 193 (H) 08/05/2019   CHOLHDL 3.8 08/05/2019   Hepatic Function Latest Ref Rng & Units 08/05/2019 08/27/2018 10/29/2017  Total Protein 6.1 - 8.1 g/dL 6.5 7.0 6.0(L)  Albumin 3.5 - 5.0 g/dL - 4.1 -  AST 10 - 35 U/L 7(L) 18 13  ALT 6 - 29 U/L 8 12 9   Alk Phosphatase 38 - 126 U/L - 77 -  Total Bilirubin 0.2 - 1.2 mg/dL 0.3 0.3 0.3     The 10-year ASCVD risk score Mikey Bussing DC Jr., et al., 2013) is: 13.2%   Values used to calculate the score:     Age: 22 years     Sex: Female     Is Non-Hispanic African American: No     Diabetic: Yes     Tobacco smoker: Yes     Systolic Blood Pressure: 675 mmHg     Is BP treated: Yes     HDL Cholesterol: 39 mg/dL     Total  Cholesterol: 148 mg/dL   Patient has failed these meds in past: NA Patient is currently controlled on the following medications:  . Rosuvastatin 5 mg daily   We discussed:  diet and exercise extensively. Patient tolerating well, denies myalgias.    Plan  Continue current medications  Diabetes   A1c goal <7%  Recent Relevant Labs: Lab Results  Component Value Date/Time   HGBA1C 6.9 (A) 12/07/2019 02:18 PM   HGBA1C 6.8 (H) 08/05/2019 03:04 PM   HGBA1C 6.7 (H) 08/27/2018 02:01 PM   HGBA1C 13.7 (A) 03/19/2018 02:04 PM   HGBA1C 14.0 (A) 10/29/2017 02:18 PM   HGBA1C 7.4 (H) 10/09/2012 04:52 AM   HGBA1C 7.1 (H) 07/02/2012 05:33 AM   MICROALBUR 1.2 08/05/2019 03:04 PM   MICROALBUR 50 03/19/2018 02:07 PM   MICROALBUR 2.0 02/20/2017 11:55 AM   MICROALBUR 100 01/24/2015 12:22 PM    Last diabetic Eye exam:  Lab Results  Component Value Date/Time   HMDIABEYEEXA No Retinopathy 02/27/2019 12:00 AM    Last diabetic Foot exam: No results found for: HMDIABFOOTEX   Checking BG: Daily typically, but patient had not been recently. Fastings: 110-120  Pre-Dinner: 110-160  Recent FBG Readings: NA Recent pre-meal BG readings: NA Recent 2hr PP BG readings:  NA Recent HS BG readings: NA  Patient has failed these meds in past: NA Patient is currently uncontrolled on the following medications: . Humalog 5-10 units three times daily (not taking) . Lantus 20 units daily (not taking) . Metformin XR 750 mg 1 tablet twice daily   We discussed: diet and exercise extensively.   Patient is only taking Lantus PRN (couple of times in past month) in the evenings if she feels her sugars are elevated. She reports symptoms of blurry vision and dry mouth when her blood sugars are >150 mg/dL.   Given patient's history of HF, SGLT2 would be a good option for glycemic control. Counseled patient on mechanism of action and side effect  profile. Patient has had yeast infections in the past, but has not had  problems with this recently. She was amenable to the idea of an SLGT2 inhibitor.   Plan  Recommend stopping Humalog and Lantus  Recommend starting Jardiance 10 mg daily  Recommend decreasing furosemide to 20 mg daily PRN - Edema   Depression / Anxiety   Managed by Dr. [name]  PHQ9 Score:  PHQ9 SCORE ONLY 01/28/2020 12/07/2019 10/14/2019  PHQ-9 Total Score 18 16 19    GAD7 Score: GAD 7 : Generalized Anxiety Score 03/19/2018 01/30/2018 10/29/2017  Nervous, Anxious, on Edge 3 3 3   Control/stop worrying 3 3 3   Worry too much - different things 3 3 3   Trouble relaxing 3 3 3   Restless 3 3 1   Easily annoyed or irritable 3 3 3   Afraid - awful might happen 3 3 1   Total GAD 7 Score 21 21 17   Anxiety Difficulty Very difficult Extremely difficult Very difficult    Patient has failed these meds in past: Effexor XR, alprazolam, lexapro,  Patient is currently controlled on the following medications:  . Duloxetine 30 mg daily  . Hydroxyzine 25 mg twice daily  . Hydroxyzine 50 mg twice daily PRN for severe anxiety  . Trazodone 100 mg QHS   We discussed:  Patient discontinued Prozac due to significant nausea and multiple episodes of vomiting. She reports feeling very frustrated that her xanax was stopped by her psychiatrist. Counseled patient on the risks of using multiple CNS depressants and the risk of respiratory depression with BZD + opioid use.   Plan  Continue current medications   COPD / Asthma / Tobacco   Managed by Dr. Raul Del   Last spirometry score: NA  Gold Grade: Gold 3 (FEV1 30-49%) Current COPD Classification:  B (high sx, <2 exacerbations/yr)  Eosinophil count:   Lab Results  Component Value Date/Time   EOSPCT 0.4 08/05/2019 03:04 PM   EOSPCT 0.9 11/16/2013 08:40 PM  %                               Eos (Absolute):  Lab Results  Component Value Date/Time   EOSABS 33 08/05/2019 03:04 PM   EOSABS 0.1 11/16/2013 08:40 PM    Tobacco Status:  Social History    Tobacco Use  Smoking Status Current Every Day Smoker  . Packs/day: 1.00  . Years: 41.00  . Pack years: 41.00  . Types: Cigarettes  . Start date: 05/19/1977  Smokeless Tobacco Never Used    Patient has failed these meds in past: NA Patient is currently controlled on the following medications:  . Albuterol HFA 108 mcg/act 1 puff as needed  . DuoNeb 3 mL q6hr PRN  . Symbicort 160-4.5 mcg/act 2 spray twice daily . Spiriva 18 mcg daily  . Montelukast 10 mg daily  . Theophylline 400 mg daily . Cetirizine 10 mg daily PRN   Using maintenance inhaler regularly? Yes Frequency of rescue inhaler use:  infrequently  We discussed:  proper inhaler technique   Ventolin   Plan  Continue current medications  Tobacco Abuse   Tobacco Status:  Social History   Tobacco Use  Smoking Status Current Every Day Smoker  . Packs/day: 1.00  . Years: 41.00  . Pack years: 41.00  . Types: Cigarettes  . Start date: 05/19/1977  Smokeless Tobacco Never Used    Patient smokes After 30 minutes of waking Patient triggers include: NA On  a scale of 1-10, reports MOTIVATION to quit is NA On a scale of 1-10, reports CONFIDENCE in quitting is NA  Previous quit attempts included: Chantix Patient is currently uncontrolled on the following:  . 1 PPD   We discussed:  Patient knows she should quit smoking but not ready to discuss at this time.   Plan  Continue current medications  Parkinson's Disease   Managed by Dr. Manuella Ghazi  Patient has failed these meds in past: NA Patient is currently controlled on the following medications:  . Carbidopa-Levodopa IR 25-100 mg 1.5 tablets three times daily   We discussed:  Patient tolerating dose change well. She feels her tremor is much improved since starting Sinemet   Plan  Continue current medications  Chronic Pain   Managed by Farris Has, PA-C  Patient has failed these meds in past: NA Patient is currently controlled on the following  medications:  . Cyclobenzaprine 10 mg three times daily . Duloxetine 30 mg daily  . Gabapentin 600 mg three times daily (Dr. Manuella Ghazi) - usually takes 1-2 tabs TID . Lidocaine 5% 2 patches daily   . Morphine ER 30 mg twice daily  . Naproxen 500 mg twice daily . Oxycodone 10 mg four times daily  We discussed:  Patient on multiple CNS depressant therapies. Meets every 3 months with pain management at the spine clinic. Patient denies symptoms of CNS depression or oversedation.   Patient does not have narcan as her previous supply expired.     Plan  Continue current medications  Recommend patient obtain in date Narcan.   Opioid-induced constipation   Managed by Farris Has, PA-C  Patient has failed these meds in past: NA Patient is currently controlled on the following medications:  . Lubiprostone 24 mcg 2 caps daily   We discussed:  Only takes PRN. Has flares of constipation from time to time.   Plan  Continue current medications   GERD   Patient denies dysphagia, heartburn or nausea. Expresses understanding to avoid triggers such as citrus juices, lying down after eating and tomato sauce.  Currently controlled on: . Omeprazole 40 mg daily  Could consider discontinuing PPI if stable, will defer until next visit.   Plan   Continue current medication.  Migraines   Managed by Dr. Manuella Ghazi (Neurology)  Patient has failed these meds in past: Propranolol (ineffective), Primidone (didn't help much), Topiramate Patient is currently controlled on the following medications:  Marland Kitchen Ubrelvy 100 mg . Naproxen 220 mg daily PRN - (headache)   We discussed:  Tries to limit NSAID use. Counseled patient on risks of long-term NSAID use.   Plan  Continue current medications  Gout   Uric Acid, Serum  Date Value Ref Range Status  12/19/2016 5.7 2.5 - 7.0 mg/dL Final    Comment:    Therapeutic target for gout patients: <6.0 mg/dL .      Goal Uric Acid < 6 mg/dL   Medications  that may increase uric acid levels: None  Last gout flare: 1 week ago (went to urgent care)   Patient has failed these meds in past: NA Patient is currently uncontrolled on the following medications:  . Colchicine 0.6 mg daily   We discussed:  Counseled patient on low purine diet plan. Counseled patient to reduce consumption of high-fructose corn syrup, sweetened soft drinks, fruit juices, meat, and seafood.   Plan  Continue current medications   Insomnia   Patient has failed these meds in past: NA Patient is  currently controlled on the following medications:  . Trazodone 50 mg QHS   We discussed:  Sleep much improved.  Plan  Continue current medications   Misc / OTC   . Prednisone 10 mg  . Promethazine 25 mg daily PRN   Plan  Recommend vitamin D level Continue current medications   Medication Management   Patient's preferred pharmacy is:  [place], [place] [address] Phone: [phone] Fax: [place], [address], [address] [place], [address] [place] [address] Phone: [phone] Fax: [phone]  Uses pill box? Yes Pt endorses 100% compliance  Plan  Continue current medication management strategy  Follow up: 3 month phone visit  [facility] [phone]

## 2020-03-24 NOTE — Patient Instructions (Signed)
Visit Information It was great speaking with you today!  Please let me know if you have any questions about our visit.  Goals Addressed            This Visit's Progress   . Chronic Care Management       CARE PLAN ENTRY (see longitudinal plan of care for additional care plan information)  Current Barriers:  . Chronic Disease Management support, education, and care coordination needs related to Hypertension, Hyperlipidemia, Diabetes, Heart Failure, GERD, COPD, Asthma, Depression, Anxiety, Osteoarthritis, Tobacco use and Chronic Pain, And Parkinson's Disease    Hypertension BP Readings from Last 3 Encounters:  02/19/20 116/73  01/28/20 118/72  12/07/19 100/60   . Pharmacist Clinical Goal(s): o Over the next 90 days, patient will work with PharmD and providers to maintain BP goal <130/80 . Current regimen:  o Furosemide 40 mg daily PRN  . Interventions: o Discussed low salt diet and exercising as tolerated extensively o Will initiate blood pressure monitoring plan  . Patient self care activities - Over the next 90 days, patient will: o Check blood pressure when symptomatic, document, and provide at future appointments o Ensure daily salt intake < 2300 mg/day  Hyperlipidemia Lab Results  Component Value Date/Time   LDLCALC 80 08/05/2019 03:04 PM   [place] 85 10/09/2012 04:52 AM   . Pharmacist Clinical Goal(s): o Over the next 90 days, patient will work with PharmD and providers to maintain LDL goal < 100 . Current regimen:  o Rosuvastatin 5 mg daily  . Interventions: o Discussed low cholesterol diet and exercising as tolerated extensively o Will initiate cholesterol monitoring plan   Diabetes Lab Results  Component Value Date/Time   HGBA1C 6.9 (A) 12/07/2019 02:18 PM   HGBA1C 6.8 (H) 08/05/2019 03:04 PM   HGBA1C 6.7 (H) 08/27/2018 02:01 PM   HGBA1C 13.7 (A) 03/19/2018 02:04 PM   HGBA1C 14.0 (A) 10/29/2017 02:18 PM   HGBA1C 7.4 (H) 10/09/2012 04:52 AM   HGBA1C 7.1  (H) 07/02/2012 05:33 AM   . Pharmacist Clinical Goal(s): o Over the next 90 days, patient will work with PharmD and providers to maintain A1c goal <7% . Current regimen:  . Humalog 5-10 units three times daily (not taking) . Lantus 20 units daily (not taking) . Metformin XR 750 mg 2 tablets daily . Interventions: o Discussed carbohydrate counting and exercising as tolerated extensively o Will initiate blood sugar monitoring plan  . Patient self care activities - Over the next 90 days, patient will: o Check blood sugar once daily, document, and provide at future appointments o Contact provider with any episodes of hypoglycemia  Medication management . Pharmacist Clinical Goal(s): o Over the next 90 days, patient will work with PharmD and providers to maintain optimal medication adherence . Current pharmacy: [facility] . Interventions o Comprehensive medication review performed. o Continue current medication management strategy . Patient self care activities - Over the next 90 days, patient will: o Take medications as prescribed o Report any questions or concerns to PharmD and/or provider(s)       Tricia [name] was given information about Chronic Care Management services today including:  1. CCM service includes personalized support from designated clinical staff supervised by her physician, including individualized plan of care and coordination with other care providers 2. 24/7 contact phone numbers for assistance for urgent and routine care needs. 3. Standard insurance, coinsurance, copays and deductibles apply for chronic care management only during months in which we provide at least  20 minutes of these services. Most insurances cover these services at 100%, however patients may be responsible for any copay, coinsurance and/or deductible if applicable. This service may help you avoid the need for more expensive face-to-face services. 4. Only one practitioner may furnish and bill  the service in a calendar month. 5. The patient may stop CCM services at any time (effective at the end of the month) by phone call to the office staff.  Patient agreed to services and verbal consent obtained.    The patient verbalized understanding of instructions, educational materials, and care plan provided today and declined offer to receive copy of patient instructions, educational materials, and care plan.  Telephone follow up appointment with pharmacy team member scheduled for: 06/22/20 at 1:00 PM  [facility] [phone]

## 2020-03-25 ENCOUNTER — Ambulatory Visit
Admission: RE | Admit: 2020-03-25 | Discharge: 2020-03-25 | Disposition: A | Discharge status: Home / Self Care | Payer: Medicare Other | Source: Ambulatory Visit | Attending: Oncology | Admitting: Oncology

## 2020-03-25 ENCOUNTER — Other Ambulatory Visit: Payer: Self-pay

## 2020-03-25 DIAGNOSIS — Z122 Encounter for screening for malignant neoplasm of respiratory organs: Secondary | ICD-10-CM

## 2020-03-25 DIAGNOSIS — Z87891 Personal history of nicotine dependence: Secondary | ICD-10-CM | POA: Diagnosis not present

## 2020-03-25 DIAGNOSIS — F17210 Nicotine dependence, cigarettes, uncomplicated: Secondary | ICD-10-CM | POA: Diagnosis not present

## 2020-03-28 NOTE — Progress Notes (Signed)
Tricia Larsson, NP 03/28/2020 8:59 AM

## 2020-03-29 ENCOUNTER — Telehealth: Payer: Self-pay | Admitting: *Deleted

## 2020-03-29 NOTE — Progress Notes (Signed)
Name: Tricia [name]   MRN: [id number]    DOB: 05/10/1960   Date:03/30/2020       Progress Note  Subjective  Chief Complaint  Abnormal CT consult  I connected with  [name]  on 03/30/20 at  3:20 PM EST by a telephone and verified that I am speaking with the correct person using two identifiers.  I discussed the limitations of evaluation and management by telemedicine and the availability of in person appointments. The patient expressed understanding and agreed to proceed with the virtual visit  Staff also discussed with the patient that there may be a patient responsible charge related to this service. Patient Location: at home  Provider Location: [facility] Additional Individuals present: alone   HPI  Abnormal CT chest: she had a CT lung cancer screen and showed   Moderate centrilobular emphysematous changes, upper lung predominant.  Patchy subpleural opacity in the lateral right middle lobe with associated ground-glass and mild peribronchovascular tree-in-bud nodularity, favoring infection/pneumonia, new from the prior.  Additional subpleural nodularity in the left upper lobe and right lower lobe which is new from the prior, measuring up to 4.6 mm (image 191), possibly infectious/inflammatory. Additional stable bilateral pulmonary nodules measuring up to 2.4 mm which are unchanged, benign.  Anda Kraft from the cancer center contacted me to let me know of the changes. We called the patient to schedule an appointment  She states she has noticed increase in cough and sob over the past couple of weeks, no fever, normal appetite, denies nausea or vomiting, but got up gagging phlegm two days ago, it was clear, she does not have a pulse ox but sob seems stable, she still has Spiriva and Symbicort at home  Patient Active Problem List   Diagnosis Date Noted  . Centrilobular emphysema ([place]) 03/30/2020  . History of prolonged Q-T interval on ECG 11/18/2019  . GAD  (generalized anxiety disorder) 10/13/2019  . MDD (major depressive disorder), recurrent episode, moderate ([place]) 10/13/2019  . At risk for long QT syndrome 10/13/2019  . Nonrheumatic mitral valve regurgitation 05/04/2019  . Benign neoplasm of descending colon   . Polyp of sigmoid colon   . Steroid-induced diabetes ([place]) 02/25/2017  . Polyneuropathy 10/21/2014  . Chronic venous insufficiency 10/05/2014  . Bilateral leg edema 08/16/2014  . Major depression in partial remission ([place]) 08/16/2014  . Acid reflux 08/16/2014  . 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 Asthma, moderate persistent 08/16/2014  . Carpal tunnel syndrome 08/16/2014  . Cervical pain 08/16/2014  . CAFL (chronic airflow limitation) ([place]) 08/16/2014  . Type 2 diabetes mellitus with peripheral neuropathy ([place]) 08/16/2014  . Diabetes mellitus type 2, insulin dependent ([place]) 08/16/2014  . Dyslipidemia 08/16/2014  . Tobacco use disorder 08/16/2014  . Essential (primary) hypertension 08/16/2014  . Benign neoplasm of stomach 08/16/2014  . Gout 08/16/2014  . HLD (hyperlipidemia) 08/16/2014  . Low back pain 08/16/2014  . Lumbar radiculopathy 08/16/2014  . Headache, migraine 08/16/2014  . Arthralgia of multiple joints 08/16/2014  . Avitaminosis D 08/16/2014  . Primary osteoarthritis of both knees 06/22/2014  . Benign essential tremor 10/02/2013  . Cervical dystonia 10/02/2013  . Chronic diastolic heart failure ([place]) 11/16/2012  . Chronic pain 11/13/2012    Past Surgical History:  Procedure Laterality Date  . CARPAL TUNNEL RELEASE Bilateral    x2 right, 1x on left  . COLONOSCOPY    . COLONOSCOPY WITH PROPOFOL N/A 04/25/2017   Procedure: COLONOSCOPY WITH PROPOFOL;  Surgeon: [name],  MD;  Location: [place];  Service: Endoscopy;  Laterality: N/A;  diabetic-oral med  . DILATION AND CURETTAGE OF UTERUS    . EXTERNAL EAR SURGERY Left    x2  . POLYPECTOMY  04/25/2017   Procedure: POLYPECTOMY INTESTINAL;   Surgeon: [name], MD;  Location: [place];  Service: Endoscopy;;  . SPINE SURGERY     herniated disc  . TUBAL LIGATION      Family History  Problem Relation Age of Onset  . Emphysema Mother   . Anxiety disorder Mother   . Stroke Father   . Throat cancer Father   . Multiple sclerosis Daughter   . Bipolar disorder Daughter   . Cervical cancer Daughter   . Bipolar disorder Daughter   . Drug abuse Daughter   . Lung cancer Maternal Aunt   . Lung cancer Maternal Uncle   . Lung cancer Maternal Grandmother   . Lung cancer Maternal Grandfather     Social History   Socioeconomic History  . Marital status: Divorced    Spouse name: Not on file  . Number of children: 2  . Years of education: Not on file  . Highest education level: Associate degree: academic program  Occupational History  . Occupation: Disability  Tobacco Use  . Smoking status: Current Every Day Smoker    Packs/day: 1.00    Years: 41.00    Pack years: 41.00    Types: Cigarettes    Start date: 05/19/1977  . Smokeless tobacco: Never Used  Vaping Use  . Vaping Use: Former  Substance and Sexual Activity  . Alcohol use: No    Alcohol/week: 0.0 standard drinks  . Drug use: No  . Sexual activity: Not Currently    Birth control/protection: None  Other Topics Concern  . Not on file  Social History Narrative   Lives with her boyfriend, she has two daughters.    Social Determinants of Health   Financial Resource Strain: Low Risk   . Difficulty of Paying Living Expenses: Not hard at all  Food Insecurity: No Food Insecurity  . Worried About Charity fundraiser in the Last Year: Never true  . Ran Out of Food in the Last Year: Never true  Transportation Needs: No Transportation Needs  . Lack of Transportation (Medical): No  . Lack of Transportation (Non-Medical): No  Physical Activity: Inactive  . Days of Exercise per Week: 0 days  . Minutes of Exercise per Session: 0 min  Stress: Stress Concern  Present  . Feeling of Stress : Rather much  Social Connections: Moderately Isolated  . Frequency of Communication with Friends and Family: More than three times a week  . Frequency of Social Gatherings with Friends and Family: Once a week  . Attends Religious Services: Never  . Active Member of Clubs or Organizations: No  . Attends Archivist Meetings: Never  . Marital Status: Living with partner  Intimate Partner Violence: Not At Risk  . Fear of Current or Ex-Partner: No  . Emotionally Abused: No  . Physically Abused: No  . Sexually Abused: No     Current Outpatient Medications:  .  albuterol (VENTOLIN HFA) 108 (90 Base) MCG/ACT inhaler, INHALE 1 PUFF BY MOUTH AS NEEDED, Disp: 18 g, Rfl: 1 .  blood glucose meter kit and supplies KIT, Dispense accuchek aviva plus; can change if needed e11.9, Disp: 1 each, Rfl: 0 .  budesonide-formoterol (SYMBICORT) 160-4.5 MCG/ACT inhaler, Inhale 2 puffs into the lungs in  the morning and at bedtime., Disp: , Rfl:  .  carbidopa-levodopa (SINEMET IR) 25-100 MG tablet, Take 1.5 tablets by mouth 3 (three) times daily., Disp: , Rfl:  .  cetirizine (ZYRTEC) 10 MG tablet, Take 10 mg by mouth daily as needed for allergies., Disp: , Rfl:  .  cyclobenzaprine (FLEXERIL) 10 MG tablet, Take 10 mg by mouth 3 (three) times daily. , Disp: , Rfl:  .  DULoxetine (CYMBALTA) 30 MG capsule, Take 1 capsule (30 mg total) by mouth daily., Disp: 90 capsule, Rfl: 0 .  furosemide (LASIX) 40 MG tablet, Take 1 tablet (40 mg total) by mouth daily as needed., Disp: 30 tablet, Rfl: 2 .  gabapentin (NEURONTIN) 600 MG tablet, Take 1 tablet (600 mg total) by mouth 3 (three) times daily., Disp: 90 tablet, Rfl: 3 .  glucose blood test strip, Use as directed to check blood glucose daily, Disp: 100 each, Rfl: 2 .  guaiFENesin (MUCINEX) 600 MG 12 hr tablet, Take 1 tablet (600 mg total) by mouth 2 (two) times daily., Disp: 40 tablet, Rfl: 0 .  hydrOXYzine (VISTARIL) 50 MG capsule,  TAKE 1 CAPSULE BY MOUTH TWICE DAILY AS NEEDED FOR SEVERE ANXIETY SYMPTOMS, Disp: 60 capsule, Rfl: 1 .  Insulin Pen Needle 32G X 6 MM MISC, 2 each by Does not apply route daily., Disp: 200 each, Rfl: 2 .  ipratropium-albuterol (DUONEB) 0.5-2.5 (3) MG/3ML SOLN, Inhale 3 mLs into the lungs every 6 (six) hours as needed., Disp: 360 mL, Rfl: 3 .  levofloxacin (LEVAQUIN) 500 MG tablet, Take 1 tablet (500 mg total) by mouth daily., Disp: 7 tablet, Rfl: 0 .  lidocaine (LIDODERM) 5 %, 1 patch every 12 (twelve) hours as needed., Disp: , Rfl:  .  lubiprostone (AMITIZA) 24 MCG capsule, Take by mouth 2 (two) times daily as needed., Disp: , Rfl:  .  metFORMIN (GLUCOPHAGE-XR) 750 MG 24 hr tablet, Take 2 tablets (1,500 mg total) by mouth daily with breakfast. (Patient taking differently: Take 750 mg by mouth daily with breakfast.), Disp: 180 tablet, Rfl: 1 .  montelukast (SINGULAIR) 10 MG tablet, TAKE 1 TABLET BY MOUTH  DAILY IN THE AFTERNOON, Disp: 90 tablet, Rfl: 2 .  morphine (MS CONTIN) 30 MG 12 hr tablet, Take 30 mg by mouth every 12 (twelve) hours., Disp: , Rfl:  .  naproxen sodium (ALEVE) 220 MG tablet, Take 220 mg by mouth daily as needed., Disp: , Rfl:  .  omeprazole (PRILOSEC) 40 MG capsule, Take 1 capsule (40 mg total) by mouth daily., Disp: 90 capsule, Rfl: 1 .  Oxycodone HCl 10 MG TABS, Take 10 mg by mouth 4 (four) times daily as needed. , Disp: , Rfl:  .  promethazine (PHENERGAN) 25 MG tablet, Take 25 mg by mouth daily as needed., Disp: , Rfl:  .  rosuvastatin (CRESTOR) 5 MG tablet, Take 1 tablet (5 mg total) by mouth at bedtime., Disp: 90 tablet, Rfl: 1 .  theophylline (UNIPHYL) 400 MG 24 hr tablet, Take 1 tablet by mouth daily. , Disp: , Rfl:  .  tiotropium (SPIRIVA) 18 MCG inhalation capsule, Place 1 capsule into inhaler and inhale daily. pm, Disp: , Rfl:  .  traZODone (DESYREL) 100 MG tablet, Take 1 tablet (100 mg total) by mouth at bedtime., Disp: 90 tablet, Rfl: 0 .  Ubrogepant (UBRELVY) 100 MG  TABS, Take 100 mg by mouth daily as needed. May repeat in 2 hours, Disp: , Rfl:  .  colchicine (COLCRYS) 0.6 MG tablet, Take  1 tablet (0.6 mg total) by mouth daily. (Patient not taking: No sig reported), Disp: 30 tablet, Rfl: 0 .  HUMALOG KWIKPEN 100 UNIT/ML KwikPen, INJECT SUBCUTANEOUSLY 5 TO  10 UNITS 3 TIMES DAILY (Patient not taking: No sig reported), Disp: 30 mL, Rfl: 2 .  insulin glargine (LANTUS SOLOSTAR) 100 UNIT/ML Solostar Pen, Inject 20 Units into the skin daily. (Patient not taking: No sig reported), Disp: 15 pen, Rfl: 1  Allergies  Allergen Reactions  . Augmentin [Amoxicillin-Pot Clavulanate] Diarrhea  . Penicillins Itching    I personally reviewed active problem list, medication list, allergies, notes from last encounter with the patient/caregiver today.   ROS   Ten systems reviewed and is negative except as mentioned in HPI   Objective  Virtual encounter, vitals not obtained.  There is no height or weight on file to calculate BMI.  Physical Exam  Awake, alert and oriented   PHQ2/9: Depression screen [facility] 2/9 03/30/2020 01/28/2020 12/07/2019 10/14/2019 08/05/2019  Decreased Interest 1 3 2 3 1   Down, Depressed, Hopeless 1 2 2 3 1   PHQ - 2 Score 2 5 4 6 2   Altered sleeping 0 3 3 2 3   Tired, decreased energy 1 3 2 3 3   Change in appetite 0 1 1 3 3   Feeling bad or failure about yourself  0 0 1 2 1   Trouble concentrating 0 3 3 3 3   Moving slowly or fidgety/restless 0 3 2 0 1  Suicidal thoughts 0 0 0 0 0  PHQ-9 Score 3 18 16 19 16   Difficult doing work/chores - Very difficult Very difficult - Not difficult at all  Some recent data might be hidden   PHQ-2/9 Result is positive.    Fall Risk: Fall Risk  03/30/2020 01/28/2020 12/07/2019 10/14/2019 08/05/2019  Falls in the past year? 1 1 1 1  0  Number falls in past yr: 1 1 1 1  0  Comment 3-4 - - - -  Injury with Fall? 0 0 1 1 0  Comment - - - - -  Risk for fall due to : - History of fall(s);Orthopedic patient History of  fall(s) - -  Risk for fall due to: Comment - - - - -  Follow up - - - - -     Assessment & Plan  1. Abnormal CT of the chest  - levofloxacin (LEVAQUIN) 500 MG tablet; Take 1 tablet (500 mg total) by mouth daily.  Dispense: 7 tablet; Refill: 0 - guaiFENesin (MUCINEX) 600 MG 12 hr tablet; Take 1 tablet (600 mg total) by mouth 2 (two) times daily.  Dispense: 40 tablet; Refill: 0  Discussed possible side effects and to go to [facility] [address] if worsening of symptoms, she has an in person follow up with me this month and prefers holding off to check labs   2. Diabetes mellitus type 2, insulin dependent ([place])   3. Centrilobular emphysema ([place])   I discussed the assessment and treatment plan with the patient. The patient was provided an opportunity to ask questions and all were answered. The patient agreed with the plan and demonstrated an understanding of the instructions.  The patient was advised to call back or seek an in-person evaluation if the symptoms worsen or if the condition fails to improve as anticipated.  I provided 15  minutes of non-face-to-face time during this encounter.

## 2020-03-29 NOTE — Telephone Encounter (Signed)
appt scheduled for tomorrow afternoon for virtual visit

## 2020-03-29 NOTE — Telephone Encounter (Signed)
Notified patient of LDCT lung cancer screening program results with recommendation for 3 month follow up imaging. Also notified of incidental findings noted below and is encouraged to discuss further with PCP who will receive a copy of this note and/or the CT report. Patient verbalizes understanding. (Patient reports increased productive cough in past 2 weeks. She is aware that she should be evaluated by her PCP for this finding. She will contact her PCP office if they do not reach out to her).  IMPRESSION: Lung-RADS 4A, suspicious. Patchy opacity in the lateral right upper lobe, favoring infection/pneumonia. Follow up low-dose chest CT without contrast in 3 months (please use the following order, "CT CHEST LCS NODULE FOLLOW-UP W/O CM") is recommended.  Stable 18 mm right thyroid nodule. Recommend thyroid US (ref: J Am

## 2020-03-30 ENCOUNTER — Telehealth (INDEPENDENT_AMBULATORY_CARE_PROVIDER_SITE_OTHER): Payer: Medicare Other | Admitting: Family Medicine

## 2020-03-30 ENCOUNTER — Encounter: Payer: Self-pay | Admitting: Family Medicine

## 2020-03-30 DIAGNOSIS — E119 Type 2 diabetes mellitus without complications: Secondary | ICD-10-CM

## 2020-03-30 DIAGNOSIS — R9389 Abnormal findings on diagnostic imaging of other specified body structures: Secondary | ICD-10-CM

## 2020-03-30 DIAGNOSIS — Z794 Long term (current) use of insulin: Secondary | ICD-10-CM

## 2020-03-30 DIAGNOSIS — J432 Centrilobular emphysema: Secondary | ICD-10-CM | POA: Diagnosis not present

## 2020-03-30 MED ORDER — GUAIFENESIN ER 600 MG PO TB12: 600.0000 mg | ORAL_TABLET | Freq: Two times a day (BID) | ORAL | 0 refills | Status: DC

## 2020-03-30 MED ORDER — LEVOFLOXACIN 500 MG PO TABS: 500.0000 mg | ORAL_TABLET | Freq: Every day | ORAL | 0 refills | Status: DC

## 2020-04-04 ENCOUNTER — Telehealth: Payer: Self-pay | Admitting: Licensed Clinical Social Worker

## 2020-04-04 ENCOUNTER — Ambulatory Visit: Payer: Medicare Other | Admitting: Licensed Clinical Social Worker

## 2020-04-04 ENCOUNTER — Other Ambulatory Visit: Payer: Self-pay

## 2020-04-04 NOTE — Telephone Encounter (Signed)
Therapist contacted patient to initiate virtual visit by phone. Pt answered w/ a raspy voice s/ she was diagnosed with pneumonia. Pt requested to reschedule today's appointment for 04/14/20.

## 2020-04-06 NOTE — Progress Notes (Deleted)
Name: Tricia [name]   MRN: [id number]    DOB: 1960-03-23   Date:04/06/2020       Progress Note  Subjective  Chief Complaint  Follow up   HPI GAD/MDD : under the care of Dr. [name], she is compliant with medication, she states she has good days and bad days.Phq 9 is high, she states her anxiety level is high and missed taking Alprazolam.   Parkinson's: she was diagnosed in 2021 by Dr. Trena Platt PA  with right hemibody parkinson's . She is taking Sinemet IR and has helped tremors slightly    Diabetesinsulin requiring: she states she has been taking medication, missed appointment with endocrinologist because of transportation problems. A1C has been at goal .  She has dyslipidemia, and microalbuminuria.  She denies polyphagia, polydipsia or polyuria. She just took prednisone given by Dr. Raul Del last week, she states glucose is even with prednisone has been 150-160. She states taking Metformin daily but uses insulin prn now   Chronic pain: sees pain clinic, used to see  Dr Sanjuan Dame and she was getting  BZD and promethazine on top of pain medications, but since he retired so she is seeing integrative pain medications, they stopped BZD and is now on buspar and hydroxizine for anxiety but states does not help as much , currently seeing Dr. [name]   Asthma and COPD and chronic airflow limitation: under the care of Dr. Raul Del.She had CT chest done, seen by pulmonologist and referred to Endo by Dr. Raul Del for evaluation of thyroid nodule, appointment next week. She is not using oxygen. She is taking Spiriva , Symbicort and prn albuterol. She had daily cough , wheezing and SOB b, she was seen last week by Dr. Raul Del and given doxycycline and prednisone for flare, she is feeling better today, still has a productive cough but not as bad, she is up to date with COVID vaccine.   CT chest from 03/17/2019  1. Insert lung rads 2 2. Bronchial wall thickening with fluid/debris in segmental  and subsegmental airways to the right lower lobe. This may reflect infectious/inflammatory etiology but in the appropriate clinical setting, aspiration could have this appearance. 3. 1.8 cm right thyroid nodule. Recommend thyroid US.(Ref: J Am Coll Radiol. 2015 Feb;12(2): 143-50). 4. Emphysema (ICD10-J43.9) and Aortic Atherosclerosis (ICD10-170.0)   Thyroid nodule: had US done by Dr. Manfred Shirts at [facility] 06/2019, she states they decided not to do a biopsy   Impression:  Right mid cystic thyroid nodule measuring 1.8 cm  Bilateral enlarged lymph nodes, several of which have a more rounded,  atypical shape (see above)    Major Depression: chronic and recurrent, phq 9 is high. . She lives with boyfriend and he has DM and also some other medical problems, she also worries about her daughter's the youngest one has multiple medical problems recently diagnosed with colon cancer and has lynch syndrome ,also has a daughter in [place] that is a drug addict. She is on disability , struggling financially, cannot afford seeing psychiatrist of therapist Taking effexor, she states mood depends on her current situation, she is seeing Dr. [name] and has a therapist and thinks it may be helping some.   CHF: doing well at this time, she has orthopnea - she uses two pillows. She used to see Dr. Arther Abbott is currently seeing  Dr. Ubaldo Glassing and is on daily lasix now   Echo from 06/05/2019  NORMAL LEFT VENTRICULAR SYSTOLIC FUNCTION  NORMAL RIGHT VENTRICULAR [place] (  See above)  NO VALVULAR STENOSIS  Closest EF: >55% (Estimated)  Aortic: MILD AR  Mitral: MILD MR  Tricuspid: MILD TR  Echo showed enlarged left and right atrium  Atherosclerosis of Aorta: discussed CT results discussed statin therapy and aspirin . Unchnaged   *** Patient Active Problem List   Diagnosis Date Noted  . Centrilobular emphysema ([place]) 03/30/2020  . History of prolonged Q-T interval on ECG  11/18/2019  . GAD (generalized anxiety disorder) 10/13/2019  . MDD (major depressive disorder), recurrent episode, moderate ([place]) 10/13/2019  . At risk for long QT syndrome 10/13/2019  . Nonrheumatic mitral valve regurgitation 05/04/2019  . Benign neoplasm of descending colon   . Polyp of sigmoid colon   . 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 Polyneuropathy 10/21/2014  . Chronic venous insufficiency 10/05/2014  . Bilateral leg edema 08/16/2014  . Major depression in partial remission ([place]) 08/16/2014  . Acid reflux 08/16/2014  . 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 Asthma, moderate persistent 08/16/2014  . Carpal tunnel syndrome 08/16/2014  . Cervical pain 08/16/2014  . CAFL (chronic airflow limitation) ([place]) 08/16/2014  . Type 2 diabetes mellitus with peripheral neuropathy ([place]) 08/16/2014  . Diabetes mellitus type 2, insulin dependent ([place]) 08/16/2014  . Dyslipidemia 08/16/2014  . Tobacco use disorder 08/16/2014  . Essential (primary) hypertension 08/16/2014  . Benign neoplasm of stomach 08/16/2014  . Gout 08/16/2014  . HLD (hyperlipidemia) 08/16/2014  . Low back pain 08/16/2014  . Lumbar radiculopathy 08/16/2014  . Headache, migraine 08/16/2014  . Arthralgia of multiple joints 08/16/2014  . Avitaminosis D 08/16/2014  . Primary osteoarthritis of both knees 06/22/2014  . Benign essential tremor 10/02/2013  . Cervical dystonia 10/02/2013  . Chronic diastolic heart failure ([place]) 11/16/2012  . Chronic pain 11/13/2012    Past Surgical History:  Procedure Laterality Date  . CARPAL TUNNEL RELEASE Bilateral    x2 right, 1x on left  . COLONOSCOPY    . COLONOSCOPY WITH PROPOFOL N/A 04/25/2017   Procedure: COLONOSCOPY WITH PROPOFOL;  Surgeon: [name], MD;  Location: [facility];  Service: Endoscopy;  Laterality: N/A;  diabetic-oral med  . DILATION AND CURETTAGE OF UTERUS    . EXTERNAL EAR SURGERY Left    x2  . POLYPECTOMY  04/25/2017   Procedure:  POLYPECTOMY INTESTINAL;  Surgeon: [name], MD;  Location: [facility];  Service: Endoscopy;;  . SPINE SURGERY     herniated disc  . TUBAL LIGATION      Family History  Problem Relation Age of Onset  . Emphysema Mother   . Anxiety disorder Mother   . Stroke Father   . Throat cancer Father   . Multiple sclerosis Daughter   . Bipolar disorder Daughter   . Cervical cancer Daughter   . Bipolar disorder Daughter   . Drug abuse Daughter   . Lung cancer Maternal Aunt   . Lung cancer Maternal Uncle   . Lung cancer Maternal Grandmother   . Lung cancer Maternal Grandfather     Social History   Tobacco Use  . Smoking status: Current Every Day Smoker    Packs/day: 1.00    Years: 41.00    Pack years: 41.00    Types: Cigarettes    Start date: 05/19/1977  . Smokeless tobacco: Never Used  Substance Use Topics  . Alcohol use: No    Alcohol/week: 0.0 standard drinks     Current Outpatient Medications:  .  albuterol (VENTOLIN HFA) 108 (90 Base) MCG/ACT inhaler, INHALE  1 PUFF BY MOUTH AS NEEDED, Disp: 18 g, Rfl: 1 .  blood glucose meter kit and supplies KIT, Dispense accuchek aviva plus; can change if needed e11.9, Disp: 1 each, Rfl: 0 .  budesonide-formoterol (SYMBICORT) 160-4.5 MCG/ACT inhaler, Inhale 2 puffs into the lungs in the morning and at bedtime., Disp: , Rfl:  .  carbidopa-levodopa (SINEMET IR) 25-100 MG tablet, Take 1.5 tablets by mouth 3 (three) times daily., Disp: , Rfl:  .  cetirizine (ZYRTEC) 10 MG tablet, Take 10 mg by mouth daily as needed for allergies., Disp: , Rfl:  .  colchicine (COLCRYS) 0.6 MG tablet, Take 1 tablet (0.6 mg total) by mouth daily. (Patient not taking: No sig reported), Disp: 30 tablet, Rfl: 0 .  cyclobenzaprine (FLEXERIL) 10 MG tablet, Take 10 mg by mouth 3 (three) times daily. , Disp: , Rfl:  .  DULoxetine (CYMBALTA) 30 MG capsule, Take 1 capsule (30 mg total) by mouth daily., Disp: 90 capsule, Rfl: 0 .  furosemide (LASIX) 40 MG tablet,  Take 1 tablet (40 mg total) by mouth daily as needed., Disp: 30 tablet, Rfl: 2 .  gabapentin (NEURONTIN) 600 MG tablet, Take 1 tablet (600 mg total) by mouth 3 (three) times daily., Disp: 90 tablet, Rfl: 3 .  glucose blood test strip, Use as directed to check blood glucose daily, Disp: 100 each, Rfl: 2 .  guaiFENesin (MUCINEX) 600 MG 12 hr tablet, Take 1 tablet (600 mg total) by mouth 2 (two) times daily., Disp: 40 tablet, Rfl: 0 .  HUMALOG KWIKPEN 100 UNIT/ML KwikPen, INJECT SUBCUTANEOUSLY 5 TO  10 UNITS 3 TIMES DAILY (Patient not taking: No sig reported), Disp: 30 mL, Rfl: 2 .  hydrOXYzine (VISTARIL) 50 MG capsule, TAKE 1 CAPSULE BY MOUTH TWICE DAILY AS NEEDED FOR SEVERE ANXIETY SYMPTOMS, Disp: 60 capsule, Rfl: 1 .  insulin glargine (LANTUS SOLOSTAR) 100 UNIT/ML Solostar Pen, Inject 20 Units into the skin daily. (Patient not taking: No sig reported), Disp: 15 pen, Rfl: 1 .  Insulin Pen Needle 32G X 6 MM MISC, 2 each by Does not apply route daily., Disp: 200 each, Rfl: 2 .  ipratropium-albuterol (DUONEB) 0.5-2.5 (3) MG/3ML SOLN, Inhale 3 mLs into the lungs every 6 (six) hours as needed., Disp: 360 mL, Rfl: 3 .  levofloxacin (LEVAQUIN) 500 MG tablet, Take 1 tablet (500 mg total) by mouth daily., Disp: 7 tablet, Rfl: 0 .  lidocaine (LIDODERM) 5 %, 1 patch every 12 (twelve) hours as needed., Disp: , Rfl:  .  lubiprostone (AMITIZA) 24 MCG capsule, Take by mouth 2 (two) times daily as needed., Disp: , Rfl:  .  metFORMIN (GLUCOPHAGE-XR) 750 MG 24 hr tablet, Take 2 tablets (1,500 mg total) by mouth daily with breakfast. (Patient taking differently: Take 750 mg by mouth daily with breakfast.), Disp: 180 tablet, Rfl: 1 .  montelukast (SINGULAIR) 10 MG tablet, TAKE 1 TABLET BY MOUTH  DAILY IN THE AFTERNOON, Disp: 90 tablet, Rfl: 2 .  morphine (MS CONTIN) 30 MG 12 hr tablet, Take 30 mg by mouth every 12 (twelve) hours., Disp: , Rfl:  .  naproxen sodium (ALEVE) 220 MG tablet, Take 220 mg by mouth daily as  needed., Disp: , Rfl:  .  omeprazole (PRILOSEC) 40 MG capsule, Take 1 capsule (40 mg total) by mouth daily., Disp: 90 capsule, Rfl: 1 .  Oxycodone HCl 10 MG TABS, Take 10 mg by mouth 4 (four) times daily as needed. , Disp: , Rfl:  .  promethazine (PHENERGAN) 25  MG tablet, Take 25 mg by mouth daily as needed., Disp: , Rfl:  .  rosuvastatin (CRESTOR) 5 MG tablet, Take 1 tablet (5 mg total) by mouth at bedtime., Disp: 90 tablet, Rfl: 1 .  theophylline (UNIPHYL) 400 MG 24 hr tablet, Take 1 tablet by mouth daily. , Disp: , Rfl:  .  tiotropium (SPIRIVA) 18 MCG inhalation capsule, Place 1 capsule into inhaler and inhale daily. pm, Disp: , Rfl:  .  traZODone (DESYREL) 100 MG tablet, Take 1 tablet (100 mg total) by mouth at bedtime., Disp: 90 tablet, Rfl: 0 .  Ubrogepant (UBRELVY) 100 MG TABS, Take 100 mg by mouth daily as needed. May repeat in 2 hours, Disp: , Rfl:   Allergies  Allergen Reactions  . Augmentin [Amoxicillin-Pot Clavulanate] Diarrhea  . Penicillins Itching    I personally reviewed {Reviewed:14835} with the patient/caregiver today.   ROS  ***  Objective  There were no vitals filed for this visit.  There is no height or weight on file to calculate BMI.  Physical Exam ***  No results found for this or any previous visit (from the past 2160 hour(s)).  Diabetic Foot Exam: Diabetic Foot Exam - Simple   No data filed    ***  PHQ2/9: Depression screen [facility] 2/9 03/30/2020 01/28/2020 12/07/2019 10/14/2019 08/05/2019  Decreased Interest 1 3 2 3 1   Down, Depressed, Hopeless 1 2 2 3 1   PHQ - 2 Score 2 5 4 6 2   Altered sleeping 0 3 3 2 3   Tired, decreased energy 1 3 2 3 3   Change in appetite 0 1 1 3 3   Feeling bad or failure about yourself  0 0 1 2 1   Trouble concentrating 0 3 3 3 3   Moving slowly or fidgety/restless 0 3 2 0 1  Suicidal thoughts 0 0 0 0 0  PHQ-9 Score 3 18 16 19 16   Difficult doing work/chores - Very difficult Very difficult - Not difficult at all  Some  recent data might be hidden    phq 9 is {gen pos [id number]} ***  Fall Risk: Fall Risk  03/30/2020 01/28/2020 12/07/2019 10/14/2019 08/05/2019  Falls in the past year? 1 1 1 1  0  Number falls in past yr: 1 1 1 1  0  Comment 3-4 - - - -  Injury with Fall? 0 0 1 1 0  Comment - - - - -  Risk for fall due to : - History of fall(s);Orthopedic patient History of fall(s) - -  Risk for fall due to: Comment - - - - -  Follow up - - - - -   ***   Functional Status Survey:   ***   Assessment & Plan  *** There are no diagnoses linked to this encounter.

## 2020-04-08 ENCOUNTER — Ambulatory Visit: Payer: Medicare Other | Admitting: Family Medicine

## 2020-04-08 DIAGNOSIS — Z124 Encounter for screening for malignant neoplasm of cervix: Secondary | ICD-10-CM

## 2020-04-08 DIAGNOSIS — E119 Type 2 diabetes mellitus without complications: Secondary | ICD-10-CM

## 2020-04-11 ENCOUNTER — Telehealth (INDEPENDENT_AMBULATORY_CARE_PROVIDER_SITE_OTHER): Payer: Medicare Other | Admitting: Psychiatry

## 2020-04-11 ENCOUNTER — Other Ambulatory Visit: Payer: Self-pay

## 2020-04-11 DIAGNOSIS — F411 Generalized anxiety disorder: Secondary | ICD-10-CM

## 2020-04-11 NOTE — Progress Notes (Signed)
No response to call or text or video invite  

## 2020-04-14 ENCOUNTER — Ambulatory Visit (INDEPENDENT_AMBULATORY_CARE_PROVIDER_SITE_OTHER): Payer: Medicare Other | Admitting: Licensed Clinical Social Worker

## 2020-04-14 ENCOUNTER — Encounter: Payer: Self-pay | Admitting: Licensed Clinical Social Worker

## 2020-04-14 ENCOUNTER — Other Ambulatory Visit: Payer: Self-pay

## 2020-04-14 DIAGNOSIS — F411 Generalized anxiety disorder: Secondary | ICD-10-CM

## 2020-04-14 DIAGNOSIS — F331 Major depressive disorder, recurrent, moderate: Secondary | ICD-10-CM

## 2020-04-14 NOTE — Progress Notes (Signed)
Virtual Visit via Video Note  I connected with Tricia [name] on 04/14/20 at  1:00 PM EST by a video enabled telemedicine application and verified that I am speaking with the correct person using two identifiers.  Participating Parties Patient Provider  Location: Patient: Home Provider: Home Office   I discussed the limitations of evaluation and management by telemedicine and the availability of in person appointments. The patient expressed understanding and agreed to proceed.  THERAPY PROGRESS NOTE  Session Time: 15 Minutes  Participation Level: Active  Behavioral Response: DisheveledLethargicTired  Type of Therapy: Individual Therapy  Treatment Goals addressed: Coping  Interventions: Supportive  Summary: Tricia [name] is a 60 y.o. female who presents with depression and anxiety sxs. Pt reported "I haven't fully recovered" from pneumonia. Pt reported almost daily having bouts of nausea and vomiting despite taking antibiotics which have run out. Pt reported yesterday "I couldn't keep anything down". Pt reported experiencing continued stressors related to her daughter's mental health issues. Pt identified attempts to cope by caring for herself and getting rest. Pt to follow up with therapist for full session next week.   Suicidal/Homicidal: No  Therapist Response: Therapist met with patient for follow up session. Therapist and patient reviewed current sxs. Therapist encouraged patient to follow up with her doctor due to continued sxs of illness causing chronic nausea and vomiting. Pt was receptive.  Plan: Return again in 1 week.  Diagnosis: Axis I: Generalized Anxiety Disorder and MDD, Recurrrent, Moderate    Axis II: N/A  Josephine Igo, LCSW, LCAS 04/14/2020

## 2020-04-17 ENCOUNTER — Other Ambulatory Visit: Payer: Self-pay | Admitting: Family Medicine

## 2020-04-17 ENCOUNTER — Other Ambulatory Visit: Payer: Self-pay

## 2020-04-17 DIAGNOSIS — G4700 Insomnia, unspecified: Secondary | ICD-10-CM

## 2020-04-24 DIAGNOSIS — R0602 Shortness of breath: Secondary | ICD-10-CM | POA: Diagnosis not present

## 2020-04-24 DIAGNOSIS — F17210 Nicotine dependence, cigarettes, uncomplicated: Secondary | ICD-10-CM | POA: Diagnosis not present

## 2020-04-24 DIAGNOSIS — E119 Type 2 diabetes mellitus without complications: Secondary | ICD-10-CM | POA: Diagnosis not present

## 2020-04-24 DIAGNOSIS — J441 Chronic obstructive pulmonary disease with (acute) exacerbation: Secondary | ICD-10-CM | POA: Diagnosis not present

## 2020-04-24 DIAGNOSIS — G20 Parkinson's disease: Secondary | ICD-10-CM | POA: Diagnosis not present

## 2020-04-24 DIAGNOSIS — Z20822 Contact with and (suspected) exposure to covid-19: Secondary | ICD-10-CM | POA: Diagnosis not present

## 2020-04-24 DIAGNOSIS — Z794 Long term (current) use of insulin: Secondary | ICD-10-CM | POA: Diagnosis not present

## 2020-04-24 DIAGNOSIS — Z881 Allergy status to other antibiotic agents status: Secondary | ICD-10-CM | POA: Diagnosis not present

## 2020-04-24 DIAGNOSIS — M1990 Unspecified osteoarthritis, unspecified site: Secondary | ICD-10-CM | POA: Diagnosis not present

## 2020-04-24 DIAGNOSIS — R059 Cough, unspecified: Secondary | ICD-10-CM | POA: Diagnosis not present

## 2020-04-24 DIAGNOSIS — F17200 Nicotine dependence, unspecified, uncomplicated: Secondary | ICD-10-CM | POA: Diagnosis not present

## 2020-04-24 DIAGNOSIS — Z7984 Long term (current) use of oral hypoglycemic drugs: Secondary | ICD-10-CM | POA: Diagnosis not present

## 2020-04-24 DIAGNOSIS — Z79899 Other long term (current) drug therapy: Secondary | ICD-10-CM | POA: Diagnosis not present

## 2020-04-26 ENCOUNTER — Telehealth: Payer: Self-pay

## 2020-04-26 ENCOUNTER — Other Ambulatory Visit: Payer: Self-pay | Admitting: Family Medicine

## 2020-04-26 DIAGNOSIS — J411 Mucopurulent chronic bronchitis: Secondary | ICD-10-CM

## 2020-04-26 NOTE — Progress Notes (Signed)
Chronic Care Management Pharmacy Assistant   Name: Tricia [name]  MRN: [id number] DOB: 08/31/1960   Reason for Encounter:Diabetes and COPD Disease State Call.   Conditions to be addressed/monitored: COPD and Diabetes   Primary concerns for visit include: Diabetes and COPD  Recent office visits:  03/30/2020 PCP [name] , Started levofloxacin (LEVAQUIN) 500 MG tablet; Take 1 tablet (500 mg total) by mouth daily.  Dispense: 7 tablet; Refill: 0 - guaiFENesin (MUCINEX) 600 MG 12 hr tablet; Take 1 tablet (600 mg total) by mouth 2 (two) times daily.  Dispense: 40 tablet; Refill:  Recent consult visits:  04/11/2020 [facility] visits:  04/24/2020 ED COPD Exacerbation, Started prednisone 20 mg take 2 tablets daily for 5 days,started azithromycin 500 mg take 1 tablet by mouth daily for 3 days.  Medications: Outpatient Encounter Medications as of 04/26/2020  Medication Sig Note  . albuterol (VENTOLIN HFA) 108 (90 Base) MCG/ACT inhaler INHALE 1 PUFF BY MOUTH AS NEEDED   . blood glucose meter kit and supplies KIT Dispense accuchek aviva plus; can change if needed e11.9   . budesonide-formoterol (SYMBICORT) 160-4.5 MCG/ACT inhaler Inhale 2 puffs into the lungs in the morning and at bedtime. 03/04/2020: Prescribed by Dr. Vella Kohler  . carbidopa-levodopa (SINEMET IR) 25-100 MG tablet Take 1.5 tablets by mouth 3 (three) times daily. 03/04/2020: Prescribed by Dr. Manuella Ghazi  . cetirizine (ZYRTEC) 10 MG tablet Take 10 mg by mouth daily as needed for allergies.   Marland Kitchen colchicine (COLCRYS) 0.6 MG tablet Take 1 tablet (0.6 mg total) by mouth daily. (Patient not taking: No sig reported) 03/02/2020: Only takes PRN  . cyclobenzaprine (FLEXERIL) 10 MG tablet Take 10 mg by mouth 3 (three) times daily.  03/04/2020: Prescribed by Farris Has, PA-C  . DULoxetine (CYMBALTA) 30 MG capsule Take 1 capsule (30 mg total) by mouth daily.   . furosemide (LASIX) 40 MG tablet Take 1  tablet (40 mg total) by mouth daily as needed.   . gabapentin (NEURONTIN) 600 MG tablet Take 1 tablet (600 mg total) by mouth 3 (three) times daily. 03/04/2020: Prescribed by Dr. Manuella Ghazi  . glucose blood test strip Use as directed to check blood glucose daily   . guaiFENesin (MUCINEX) 600 MG 12 hr tablet Take 1 tablet (600 mg total) by mouth 2 (two) times daily.   Marland Kitchen HUMALOG KWIKPEN 100 UNIT/ML KwikPen INJECT SUBCUTANEOUSLY 5 TO  10 UNITS 3 TIMES DAILY (Patient not taking: No sig reported)   . hydrOXYzine (VISTARIL) 50 MG capsule TAKE 1 CAPSULE BY MOUTH TWICE DAILY AS NEEDED FOR SEVERE ANXIETY SYMPTOMS   . insulin glargine (LANTUS SOLOSTAR) 100 UNIT/ML Solostar Pen Inject 20 Units into the skin daily. (Patient not taking: No sig reported) 03/24/2020: Only taking PRN in the evenings if her sugars feel elevated  . Insulin Pen Needle 32G X 6 MM MISC 2 each by Does not apply route daily.   Marland Kitchen ipratropium-albuterol (DUONEB) 0.5-2.5 (3) MG/3ML SOLN Inhale 3 mLs into the lungs every 6 (six) hours as needed.   Marland Kitchen levofloxacin (LEVAQUIN) 500 MG tablet Take 1 tablet (500 mg total) by mouth daily.   Marland Kitchen lidocaine (LIDODERM) 5 % 1 patch every 12 (twelve) hours as needed. 03/04/2020: Prescribed by Farris Has, PA-C  . lubiprostone (AMITIZA) 24 MCG capsule Take by mouth 2 (two) times daily as needed. 03/04/2020: Prescribed by Farris Has, PA-C  . metFORMIN (GLUCOPHAGE-XR) 750 MG 24 hr tablet Take 2 tablets (1,500 mg total) by mouth  daily with breakfast. (Patient taking differently: Take 750 mg by mouth daily with breakfast.)   . montelukast (SINGULAIR) 10 MG tablet TAKE 1 TABLET BY MOUTH  DAILY IN THE AFTERNOON   . morphine (MS CONTIN) 30 MG 12 hr tablet Take 30 mg by mouth every 12 (twelve) hours. 03/04/2020: Prescribed by Byeol Henson, PA-C  . naproxen sodium (ALEVE) 220 MG tablet Take 220 mg by mouth daily as needed.   . omeprazole (PRILOSEC) 40 MG capsule Take 1 capsule (40 mg total) by mouth daily.   . Oxycodone HCl 10  MG TABS Take 10 mg by mouth 4 (four) times daily as needed.  03/04/2020: Prescribed by Byeol Henson, PA-C  . promethazine (PHENERGAN) 25 MG tablet Take 25 mg by mouth daily as needed. 03/04/2020: Prescribed by Dr. Shah  . rosuvastatin (CRESTOR) 5 MG tablet Take 1 tablet (5 mg total) by mouth at bedtime.   . theophylline (UNIPHYL) 400 MG 24 hr tablet Take 1 tablet by mouth daily.  03/04/2020: Prescribed by Dr. Flemming  . tiotropium (SPIRIVA) 18 MCG inhalation capsule Place 1 capsule into inhaler and inhale daily. pm 03/04/2020: Prescribed by Dr. Flemming  . traZODone (DESYREL) 100 MG tablet Take 1 tablet (100 mg total) by mouth at bedtime.   . traZODone (DESYREL) 50 MG tablet TAKE 1 TABLET BY MOUTH AT  BEDTIME   . Ubrogepant (UBRELVY) 100 MG TABS Take 100 mg by mouth daily as needed. May repeat in 2 hours 03/04/2020: Prescribed by Dr. Shah   No facility-administered encounter medications on file as of 04/26/2020.     Star Rating Drugs:rosuvastatin 5 mg ,Metformin 750 mg  Recent Relevant Labs: Lab Results  Component Value Date/Time   HGBA1C 6.9 (A) 12/07/2019 02:18 PM   HGBA1C 6.8 (H) 08/05/2019 03:04 PM   HGBA1C 6.7 (H) 08/27/2018 02:01 PM   HGBA1C 13.7 (A) 03/19/2018 02:04 PM   HGBA1C 14.0 (A) 10/29/2017 02:18 PM   HGBA1C 7.4 (H) 10/09/2012 04:52 AM   HGBA1C 7.1 (H) 07/02/2012 05:33 AM   MICROALBUR 1.2 08/05/2019 03:04 PM   MICROALBUR 50 03/19/2018 02:07 PM   MICROALBUR 2.0 02/20/2017 11:55 AM   MICROALBUR 100 01/24/2015 12:22 PM    Kidney Function Lab Results  Component Value Date/Time   CREATININE 0.85 08/05/2019 03:04 PM   CREATININE 1.04 (H) 08/27/2018 02:01 PM   CREATININE 0.89 10/29/2017 03:19 PM   GFRNONAA 76 08/05/2019 03:04 PM   GFRAA 88 08/05/2019 03:04 PM    . Current antihyperglycemic regimen:   Humalog 5-10 units three times daily (not taking)  Lantus 20 units daily   Metformin XR 750 mg 2 tablets daily . What recent interventions/DTPs have been made to improve  glycemic control:  ? Check blood sugar once daily, document, and provide at future appointments ? Contact provider with any episodes of hypoglycemia . Have there been any recent hospitalizations or ED visits since last visit with CPP? Yes . Patient reports hypoglycemic symptoms, including Shaky  o Patient states when she feels like her blood sugar low or high she will lay down and take a nap. . Patient reports hyperglycemic symptoms, including blurry vision and excessive thirst . How often are you checking your blood sugar? once daily . What are your blood sugars ranging?  . Patient states she has not been checking her blood sugar lately because she is on prednisone, but when she was her blood sugar was ranging around 120-160. o Fasting: N/A o Before meals: N/A o After meals: N/A   o Bedtime: N/A . During the week, how often does your blood glucose drop below 70? Never . Are you checking your feet daily/regularly?   Patient reports pain , numbness in both feet. Adherence Review: Is the patient currently on a STATIN medication? Yes Is the patient currently on ACE/ARB medication? No Does the patient have >5 day gap between last estimated fill dates? Yes     CAT ASSESSMENT  Rank each of the following items on a scale of 0 to 5 (with 5 being most severe) Write a # 0-5 in each box  I never cough (0) > I cough all the time (5) 5  I have no phlegm (mucus) in my chest (0) > My chest is completely full of phlegm (mucus) (5) 0  My chest does not feel tight at all (0) > My chest feels very tight (5) 3  When I walk up a hill or one flight of stairs I am not breathless (0) > When I walk up a hill or one flight of stairs I am very breathless (5) 4  I am not limited doing any activities at home (0) > I am very limited doing activities at home (5) 4  I am confident leaving my home despite my lung function (0) > I am not at all confident leaving my home because of my lung condition (5)  1  I sleep  soundly (0) > I don't sleep soundly because of my lung condition (5) 5  I have lots of energy (0) > I have no energy at all (5) 5   Total CAT Score: 27

## 2020-04-26 NOTE — Telephone Encounter (Signed)
Requested medication (s) are due for refill today:   Yes  Requested medication (s) are on the active medication list:   Yes  Future visit scheduled:   No   Last ordered: 12/21/2019 18 g, 1 refill  Clinic note:  Returned because pharmacy requesting something that's covered by her insurance.   Requested Prescriptions  Pending Prescriptions Disp Refills   VENTOLIN HFA 108 (90 Base) MCG/ACT inhaler [Pharmacy Med Name: VENTOLIN HFA 108 (90 BASE) MCG/ACT] 18 g 1    Sig: INHALE 1 PUFF BY MOUTH AS NEEDED      Pulmonology:  Beta Agonists Failed - 04/26/2020  1:30 PM      Failed - One inhaler should last at least one month. If the patient is requesting refills earlier, contact the patient to check for uncontrolled symptoms.      Passed - Valid encounter within last 12 months    Recent Outpatient Visits           3 weeks ago Abnormal CT of the chest   [facility] [name], MD   4 months ago Type 2 diabetes mellitus with peripheral neuropathy [facility])   [facility] [name], MD   6 months ago Parkinson's disease [facility])   [facility] [name], MD   8 months ago Atherosclerosis of aorta [facility])   [facility] [name], MD   11 months ago [place], [place]       Future Appointments             In 9 months [facility], [facility]

## 2020-05-05 ENCOUNTER — Other Ambulatory Visit: Payer: Self-pay | Admitting: Family Medicine

## 2020-05-05 DIAGNOSIS — R0602 Shortness of breath: Secondary | ICD-10-CM | POA: Diagnosis not present

## 2020-05-05 DIAGNOSIS — J439 Emphysema, unspecified: Secondary | ICD-10-CM | POA: Diagnosis not present

## 2020-05-05 DIAGNOSIS — R059 Cough, unspecified: Secondary | ICD-10-CM | POA: Diagnosis not present

## 2020-05-05 DIAGNOSIS — R062 Wheezing: Secondary | ICD-10-CM | POA: Diagnosis not present

## 2020-05-05 DIAGNOSIS — J411 Mucopurulent chronic bronchitis: Secondary | ICD-10-CM

## 2020-05-05 DIAGNOSIS — Z720 Tobacco use: Secondary | ICD-10-CM | POA: Diagnosis not present

## 2020-05-05 DIAGNOSIS — R053 Chronic cough: Secondary | ICD-10-CM | POA: Diagnosis not present

## 2020-05-05 DIAGNOSIS — F17210 Nicotine dependence, cigarettes, uncomplicated: Secondary | ICD-10-CM | POA: Diagnosis not present

## 2020-05-13 ENCOUNTER — Other Ambulatory Visit: Payer: Self-pay | Admitting: Family Medicine

## 2020-05-13 DIAGNOSIS — E041 Nontoxic single thyroid nodule: Secondary | ICD-10-CM

## 2020-05-13 DIAGNOSIS — E119 Type 2 diabetes mellitus without complications: Secondary | ICD-10-CM

## 2020-05-13 MED ORDER — GLUCOSE BLOOD VI STRP: ORAL_STRIP | 2 refills | Status: DC

## 2020-05-13 NOTE — Telephone Encounter (Signed)
Requested medication (s) are due for refill today:   Yes  Requested medication (s) are on the active medication list:   Yes  Future visit scheduled:   No   Last ordered: 06/21/2017 #100, 2 refills by Suezanne Cheshire, NP  Clinic note:  Returned because last prescribed by a different provider.   Requested Prescriptions  Pending Prescriptions Disp Refills   glucose blood test strip 100 each 2    Sig: Use as directed to check blood glucose daily      Endocrinology: Diabetes - Testing Supplies Passed - 05/13/2020  1:31 PM      Passed - Valid encounter within last 12 months    Recent Outpatient Visits           1 month ago Abnormal CT of the chest   [facility] [name], MD   5 months ago Type 2 diabetes mellitus with peripheral neuropathy [facility])   [facility] [name], MD   7 months ago Parkinson's disease [facility])   [facility] [name], MD   9 months ago Atherosclerosis of aorta [facility])   [facility] [name], MD   1 year ago [place], [place]       Future Appointments             In 8 months [facility]

## 2020-05-13 NOTE — Telephone Encounter (Signed)
Medication Refill - Medication: Test strips Accucheck Aviva Plus   Has the patient contacted their pharmacy? No. Pt states that this will be the first time this prescription will be sent to this pharmacy. Please advise.  (Agent: If no, request that the patient contact the pharmacy for the refill.) (Agent: If yes, when and what did the pharmacy advise?)  Preferred Pharmacy (with phone number or street name):  [facility], [address], [place], [place] [address]  Phone: [phone] Fax: [phone]  Hours: Not open 24 hours     Agent: Please be advised that RX refills may take up to 3 business days. We ask that you follow-up with your pharmacy.

## 2020-05-26 DIAGNOSIS — Z79891 Long term (current) use of opiate analgesic: Secondary | ICD-10-CM | POA: Diagnosis not present

## 2020-05-26 DIAGNOSIS — Z79899 Other long term (current) drug therapy: Secondary | ICD-10-CM | POA: Diagnosis not present

## 2020-05-26 DIAGNOSIS — G894 Chronic pain syndrome: Secondary | ICD-10-CM | POA: Diagnosis not present

## 2020-05-26 DIAGNOSIS — M542 Cervicalgia: Secondary | ICD-10-CM | POA: Diagnosis not present

## 2020-05-26 DIAGNOSIS — M25569 Pain in unspecified knee: Secondary | ICD-10-CM | POA: Diagnosis not present

## 2020-05-26 DIAGNOSIS — K5903 Drug induced constipation: Secondary | ICD-10-CM | POA: Diagnosis not present

## 2020-05-26 DIAGNOSIS — M5416 Radiculopathy, lumbar region: Secondary | ICD-10-CM | POA: Diagnosis not present

## 2020-05-26 DIAGNOSIS — R519 Headache, unspecified: Secondary | ICD-10-CM | POA: Diagnosis not present

## 2020-05-30 ENCOUNTER — Telehealth: Payer: Self-pay

## 2020-05-30 ENCOUNTER — Encounter: Payer: Self-pay | Admitting: Family Medicine

## 2020-05-30 NOTE — Progress Notes (Signed)
Chronic Care Management Pharmacy Assistant   Name: Tricia [name]  MRN: [id number] DOB: 1960-06-24  Reason for Encounter:Hypertension and Hyperlipidemia Disease State Call.   Recent office visits:  No recent office visit  Recent consult visits:  05/05/2020 Pulmonology [facility] visits:  None in previous 6 months  Medications: Outpatient Encounter Medications as of 05/30/2020  Medication Sig Note  . blood glucose meter kit and supplies KIT Dispense accuchek aviva plus; can change if needed e11.9   . budesonide-formoterol (SYMBICORT) 160-4.5 MCG/ACT inhaler Inhale 2 puffs into the lungs in the morning and at bedtime. 03/04/2020: Prescribed by Dr. Vella Kohler  . carbidopa-levodopa (SINEMET IR) 25-100 MG tablet Take 1.5 tablets by mouth 3 (three) times daily. 03/04/2020: Prescribed by Dr. Manuella Ghazi  . cetirizine (ZYRTEC) 10 MG tablet Take 10 mg by mouth daily as needed for allergies.   Marland Kitchen colchicine (COLCRYS) 0.6 MG tablet Take 1 tablet (0.6 mg total) by mouth daily. (Patient not taking: No sig reported) 03/02/2020: Only takes PRN  . cyclobenzaprine (FLEXERIL) 10 MG tablet Take 10 mg by mouth 3 (three) times daily.  03/04/2020: Prescribed by Farris Has, PA-C  . DULoxetine (CYMBALTA) 30 MG capsule Take 1 capsule (30 mg total) by mouth daily.   . furosemide (LASIX) 40 MG tablet Take 1 tablet (40 mg total) by mouth daily as needed.   . gabapentin (NEURONTIN) 600 MG tablet Take 1 tablet (600 mg total) by mouth 3 (three) times daily. 03/04/2020: Prescribed by Dr. Manuella Ghazi  . glucose blood test strip Use as directed to check blood glucose daily   . guaiFENesin (MUCINEX) 600 MG 12 hr tablet Take 1 tablet (600 mg total) by mouth 2 (two) times daily.   Marland Kitchen HUMALOG KWIKPEN 100 UNIT/ML KwikPen INJECT SUBCUTANEOUSLY 5 TO  10 UNITS 3 TIMES DAILY (Patient not taking: No sig reported)   . hydrOXYzine (VISTARIL) 50 MG capsule TAKE 1 CAPSULE BY MOUTH TWICE DAILY AS NEEDED FOR SEVERE ANXIETY  SYMPTOMS   . insulin glargine (LANTUS SOLOSTAR) 100 UNIT/ML Solostar Pen Inject 20 Units into the skin daily. (Patient not taking: No sig reported) 03/24/2020: Only taking PRN in the evenings if her sugars feel elevated  . Insulin Pen Needle 32G X 6 MM MISC 2 each by Does not apply route daily.   Marland Kitchen ipratropium-albuterol (DUONEB) 0.5-2.5 (3) MG/3ML SOLN Inhale 3 mLs into the lungs every 6 (six) hours as needed.   Marland Kitchen levofloxacin (LEVAQUIN) 500 MG tablet Take 1 tablet (500 mg total) by mouth daily.   Marland Kitchen lidocaine (LIDODERM) 5 % 1 patch every 12 (twelve) hours as needed. 03/04/2020: Prescribed by Farris Has, PA-C  . lubiprostone (AMITIZA) 24 MCG capsule Take by mouth 2 (two) times daily as needed. 03/04/2020: Prescribed by Farris Has, PA-C  . metFORMIN (GLUCOPHAGE-XR) 750 MG 24 hr tablet Take 2 tablets (1,500 mg total) by mouth daily with breakfast. (Patient taking differently: Take 750 mg by mouth daily with breakfast.)   . montelukast (SINGULAIR) 10 MG tablet TAKE 1 TABLET BY MOUTH  DAILY IN THE AFTERNOON   . morphine (MS CONTIN) 30 MG 12 hr tablet Take 30 mg by mouth every 12 (twelve) hours. 03/04/2020: Prescribed by Farris Has, PA-C  . naproxen sodium (ALEVE) 220 MG tablet Take 220 mg by mouth daily as needed.   Marland Kitchen omeprazole (PRILOSEC) 40 MG capsule Take 1 capsule (40 mg total) by mouth daily.   . Oxycodone HCl 10 MG TABS Take 10 mg by mouth 4 (four) times  daily as needed.  03/04/2020: Prescribed by Farris Has, PA-C  . promethazine (PHENERGAN) 25 MG tablet Take 25 mg by mouth daily as needed. 03/04/2020: Prescribed by Dr. Manuella Ghazi  . rosuvastatin (CRESTOR) 5 MG tablet Take 1 tablet (5 mg total) by mouth at bedtime.   . theophylline (UNIPHYL) 400 MG 24 hr tablet Take 1 tablet by mouth daily.  03/04/2020: Prescribed by Dr. Vella Kohler  . tiotropium (SPIRIVA) 18 MCG inhalation capsule Place 1 capsule into inhaler and inhale daily. pm 03/04/2020: Prescribed by Dr. Vella Kohler  . traZODone (DESYREL) 100 MG tablet  Take 1 tablet (100 mg total) by mouth at bedtime.   . traZODone (DESYREL) 50 MG tablet TAKE 1 TABLET BY MOUTH AT  BEDTIME   . Ubrogepant (UBRELVY) 100 MG TABS Take 100 mg by mouth daily as needed. May repeat in 2 hours 03/04/2020: Prescribed by Dr. Manuella Ghazi  . VENTOLIN HFA 108 (90 Base) MCG/ACT inhaler INHALE 1 PUFF BY MOUTH AS NEEDED    No facility-administered encounter medications on file as of 05/30/2020.    Star Rating Drugs: Metformin 750 mg last filled on 08/05/2019 for 90 day supply Rosuvastatin 5 mg last filled on 12/07/2019 for 90 day supply   Reviewed chart prior to disease state call. Spoke with patient regarding BP  Recent Office Vitals: BP Readings from Last 3 Encounters:  02/19/20 116/73  01/28/20 118/72  12/07/19 100/60   Pulse Readings from Last 3 Encounters:  02/19/20 100  01/28/20 99  12/07/19 96    Wt Readings from Last 3 Encounters:  03/25/20 170 lb (77.1 kg)  01/28/20 174 lb 1.6 oz (79 kg)  12/07/19 172 lb (78 kg)     Kidney Function Lab Results  Component Value Date/Time   CREATININE 0.85 08/05/2019 03:04 PM   CREATININE 1.04 (H) 08/27/2018 02:01 PM   CREATININE 0.89 10/29/2017 03:19 PM   GFRNONAA 76 08/05/2019 03:04 PM   GFRAA 88 08/05/2019 03:04 PM    BMP Latest Ref Rng & Units 08/05/2019 08/27/2018 10/29/2017  Glucose 65 - 99 mg/dL 266(H) 119(H) 475(H)  BUN 7 - 25 mg/dL _0 Creatinine 0.50 - 1.05 mg/dL 0.85 1.04(H) 0.89  BUN/Creat Ratio 6 - 22 (calc) NOT APPLICABLE - NOT APPLICABLE  Sodium 627 - 146 mmol/L 136 139 136  Potassium 3.5 - 5.3 mmol/L 4.0 3.9 3.6  Chloride 98 - 110 mmol/L 103 103 101  CO2 20 - 32 mmol/L _1 Calcium 8.6 - 10.4 mg/dL 8.9 8.9 8.7    . Current antihypertensive regimen:  ? Furosemide 40 mg daily PRN   Patient reports she has chronic migraines but denies dizziness. . How often are you checking your Blood Pressure? Patient states she does not check her blood pressure at home. . Current home BP readings: None  ID . What recent interventions/DTPs have been made by any provider to improve Blood Pressure control since last CPP Visit: None ID . Any recent hospitalizations or ED visits since last visit with CPP? No . What diet changes have been made to improve Blood Pressure Control?  o Patient states the only thing she adds salt to is her potatoes. . What exercise is being done to improve your Blood Pressure Control?  o Patient reports she walks as much as she can when she has free time.  Adherence Review: Is the patient currently on ACE/ARB medication? No Does the patient have >5 day gap between last estimated fill dates? No   05/30/2020 Name: Benjamine Mola  Tricia [name] MRN: [id number] DOB: 12-Jan-1961 Tricia [name] is a 60 y.o. year old female who is a primary care patient of [name], MD.  Comprehensive medication review performed; Spoke to patient regarding cholesterol  Lipid Panel    Component Value Date/Time   CHOL 148 08/05/2019 1504   CHOL 163 07/06/2015 0858   CHOL 144 10/09/2012 0452   TRIG 193 (H) 08/05/2019 1504   TRIG 172 10/09/2012 0452   HDL 39 (L) 08/05/2019 1504   HDL 31 (L) 07/06/2015 0858   HDL 25 (L) 10/09/2012 0452   LDLCALC 80 08/05/2019 1504   LDLCALC 85 10/09/2012 0452    10-year ASCVD risk score: The 10-year ASCVD risk score Mikey Bussing DC Brooke Bonito., et al., 2013) is: 18.3%   Values used to calculate the score:     Age: 71 years     Sex: Female     Is Non-Hispanic African American: No     Diabetic: Yes     Tobacco smoker: Yes     Systolic Blood Pressure: 417 mmHg     Is BP treated: Yes     HDL Cholesterol: 39 mg/dL     Total Cholesterol: 148 mg/dL  . Current antihyperlipidemic regimen:  ? Rosuvastatin 5 mg daily  . Previous antihyperlipidemic medications tried: None ID . ASCVD risk enhancing conditions: DM, HTN and CHF . What recent interventions/DTPs have been made by any provider to improve Cholesterol control since last CPP Visit: None ID . Any recent  hospitalizations or ED visits since last visit with CPP? No . What diet changes have been made to improve Cholesterol?  o Patient states the only thing she adds salt to is her potatoes. . What exercise is being done to improve Cholesterol?  o Patient reports she walks as much as she can when she has free time.    Adherence Review: Does the patient have >5 day gap between last estimated fill dates? No  Patient states she is having issue with a gout flare up.Patient states she has a history with gout flare up.Patient reports she went to urgent care,and they prescribe prednisone.Patient states the prednisone help her hands but she is unsure if prednisone is the correct treatment.Patient is asking if we can request a refill for Colchicine and indomethacin from her PCP. Notified Clinical Pharmacist.   Per Clinical Pharmacist, prednisone can be a treatment but not always.Patient needs a visit with PCP to discuss her current flare and discuss potential acute and long-term treatment options.  Patient states she will call and make appointment with her PCP.  [facility] Pharmacist Assistant [phone]

## 2020-05-31 ENCOUNTER — Ambulatory Visit: Payer: Medicare Other | Admitting: Licensed Clinical Social Worker

## 2020-05-31 ENCOUNTER — Other Ambulatory Visit: Payer: Self-pay

## 2020-06-04 ENCOUNTER — Other Ambulatory Visit: Payer: Self-pay | Admitting: Psychiatry

## 2020-06-04 DIAGNOSIS — F331 Major depressive disorder, recurrent, moderate: Secondary | ICD-10-CM

## 2020-06-04 DIAGNOSIS — F411 Generalized anxiety disorder: Secondary | ICD-10-CM

## 2020-06-07 ENCOUNTER — Telehealth: Payer: Self-pay

## 2020-06-07 NOTE — Telephone Encounter (Signed)
  Chronic Care Management   Outreach Note  06/07/2020 Name: Tricia [name] MRN: [id number] DOB: 1960/05/03  Primary Care Provider: [name], MD Reason for referral : Chronic Care Management   [name] is currently enrolled in the Chronic Care Management program. Attempted to reach her today. Call was answered by a member in the home who agreed to relay message for her to call when she returns home.    Follow Up Plan:  Pending return call. Anticipate completing care management outreach within the next two weeks.    [facility] Care Management [facility] [phone]

## 2020-06-17 ENCOUNTER — Telehealth: Payer: Self-pay | Admitting: *Deleted

## 2020-06-17 ENCOUNTER — Telehealth: Payer: Self-pay

## 2020-06-17 NOTE — Telephone Encounter (Signed)
Copied from [place] [id number]. Topic: General - Other >> Jun 17, 2020 12:04 PM Leward Quan A wrote: Reason for CRM: Patient called back in reference to her appointment on 06/21/20 can be reached at Ph# [phone]

## 2020-06-17 NOTE — Chronic Care Management (AMB) (Signed)
  Care Management   Note  06/17/2020 Name: [name] MRN: [id number] DOB: 05-01-1960  Tricia [name] is a 60 y.o. year old female who is a primary care patient of [name], MD and is actively engaged with the care management team. I reached out to [name] by phone today to assist with re-scheduling a follow up visit with the Pharmacist  Follow up plan: Unsuccessful telephone outreach attempt made. A HIPAA compliant phone message was left for the patient providing contact information and requesting a return call.  The care management team will reach out to the patient again over the next 3 days.  If patient returns call to provider office, please advise to call [place] at [phone].  [facility]

## 2020-06-22 ENCOUNTER — Telehealth: Payer: Self-pay

## 2020-06-22 NOTE — Progress Notes (Deleted)
Chronic Care Management Pharmacy Note  06/22/2020 Name:  Tricia [name] MRN:  [id number] DOB:  [birth year]  Subjective: Tricia [name] is an 60 y.o. year old female who is a primary patient of [name], MD.  The CCM team was consulted for assistance with disease management and care coordination needs.    Engaged with patient by telephone for follow up visit in response to provider referral for pharmacy case management and/or care coordination services.   Consent to Services:  The patient was given information about Chronic Care Management services, agreed to services, and gave verbal consent prior to initiation of services.  Please see initial visit note for detailed documentation.   Patient Care Team: [name], MD as PCP - General (Family Medicine) Erby Pian, MD as Consulting Physician (Pulmonary Disease) [name], MD as Consulting Physician (Neurology) Ubaldo Glassing Javier Docker, MD as Consulting Physician (Cardiology) Lonia Farber, MD as Consulting Physician (Internal Medicine) Neldon Labella, RN as Registered Nurse Germaine Pomfret, [facility] (Pharmacist) Ursula Alert, MD as Consulting Physician (Psychiatry)  Recent office visits: 01/28/20: Patient presented to Clemetine Marker, LPN for AWV.  27/06/23: Patient presented to Dr. [name] for Follow-up. A1c stable at 6.9%.  Ajovy stopped.   Recent consult visits: 05/05/20: Patient presented to Dr. Raul Del (Pulmonology) for follow-up. Paitnet started on medrol dose pack, doxycycline.  03/23/20: Patient presented to Dr. [name] [facility]) for follow-up. Buspirone stopped. Patient started on Duloxetine 30 mg daily  02/24/20: Patient presented to Dr. [name] [facility]) for follow-up.  02/19/20: Patient presented to ED for right wrist pain.  01/25/20: Patient presented to Dr. Manuella Ghazi for Parkinson's follow-up. Sinemet increased to 1.5 tablets three times daily. Emgality discontinued.  01/18/20:  Patient presented to Dr. [name] [facility]) for follow-up.   Hospital visits: 04/24/20: Patient presented to ED for COPD exacerbation. Patient prescribed prednisone + azithromycin   Objective:  Lab Results  Component Value Date   CREATININE 0.85 08/05/2019   BUN 10 08/05/2019   GFRNONAA 76 08/05/2019   GFRAA 88 08/05/2019   NA 136 08/05/2019   K 4.0 08/05/2019   CALCIUM 8.9 08/05/2019   CO2 26 08/05/2019   GLUCOSE 266 (H) 08/05/2019    Lab Results  Component Value Date/Time   HGBA1C 6.9 (A) 12/07/2019 02:18 PM   HGBA1C 6.8 (H) 08/05/2019 03:04 PM   HGBA1C 6.7 (H) 08/27/2018 02:01 PM   HGBA1C 13.7 (A) 03/19/2018 02:04 PM   HGBA1C 14.0 (A) 10/29/2017 02:18 PM   HGBA1C 7.4 (H) 10/09/2012 04:52 AM   HGBA1C 7.1 (H) 07/02/2012 05:33 AM   MICROALBUR 1.2 08/05/2019 03:04 PM   MICROALBUR 50 03/19/2018 02:07 PM   MICROALBUR 2.0 02/20/2017 11:55 AM   MICROALBUR 100 01/24/2015 12:22 PM    Last diabetic Eye exam:  Lab Results  Component Value Date/Time   HMDIABEYEEXA No Retinopathy 02/27/2019 12:00 AM    Last diabetic Foot exam: No results found for: HMDIABFOOTEX   Lab Results  Component Value Date   CHOL 148 08/05/2019   HDL 39 (L) 08/05/2019   LDLCALC 80 08/05/2019   TRIG 193 (H) 08/05/2019   CHOLHDL 3.8 08/05/2019    Hepatic Function Latest Ref Rng & Units 08/05/2019 08/27/2018 10/29/2017  Total Protein 6.1 - 8.1 g/dL 6.5 7.0 6.0(L)  Albumin 3.5 - 5.0 g/dL - 4.1 -  AST 10 - 35 U/L 7(L) 18 13  ALT 6 - 29 U/L 8 12 9   Alk Phosphatase 38 - 126 U/L - 77 -  Total Bilirubin 0.2 - 1.2 mg/dL 0.3 0.3 0.3    Lab Results  Component Value Date/Time   TSH 0.31 (L) 08/05/2019 03:04 PM   TSH 1.380 10/04/2014 04:20 PM    CBC Latest Ref Rng & Units 08/05/2019 08/27/2018 10/29/2017  WBC 3.8 - 10.8 Thousand/uL 8.3 6.3 6.2  Hemoglobin 11.7 - 15.5 g/dL 14.2 14.1 13.9  Hematocrit 35.0 - 45.0 % 43.1 42.2 42.7  Platelets 140 - 400 Thousand/uL 206 235 181    Lab Results  Component  Value Date/Time   VD25OH 17 (L) 08/05/2019 03:04 PM   VD25OH 12 (L) 10/29/2017 03:19 PM    Clinical ASCVD: {YES/NO:21197} The 10-year ASCVD risk score Mikey Bussing DC Jr., et al., 2013) is: 18.3%   Values used to calculate the score:     Age: 79 years     Sex: Female     Is Non-Hispanic African American: No     Diabetic: Yes     Tobacco smoker: Yes     Systolic Blood Pressure: 536 mmHg     Is BP treated: Yes     HDL Cholesterol: 39 mg/dL     Total Cholesterol: 148 mg/dL    Depression screen [facility] 2/9 03/30/2020 01/28/2020 12/07/2019  Decreased Interest 1 3 2   Down, Depressed, Hopeless 1 2 2   PHQ - 2 Score 2 5 4   Altered sleeping 0 3 3  Tired, decreased energy 1 3 2   Change in appetite 0 1 1  Feeling bad or failure about yourself  0 0 1  Trouble concentrating 0 3 3  Moving slowly or fidgety/restless 0 3 2  Suicidal thoughts 0 0 0  PHQ-9 Score 3 18 16   Difficult doing work/chores - Very difficult Very difficult  Some recent data might be hidden     ***Other: (CHADS2VASc if Afib, MMRC or CAT for COPD, ACT, DEXA)  Social History   Tobacco Use  Smoking Status Current Every Day Smoker  . Packs/day: 1.00  . Years: 41.00  . Pack years: 41.00  . Types: Cigarettes  . Start date: 05/19/1977  Smokeless Tobacco Never Used   BP Readings from Last 3 Encounters:  02/19/20 116/73  01/28/20 118/72  12/07/19 100/60   Pulse Readings from Last 3 Encounters:  02/19/20 100  01/28/20 99  12/07/19 96   Wt Readings from Last 3 Encounters:  03/25/20 170 lb (77.1 kg)  01/28/20 174 lb 1.6 oz (79 kg)  12/07/19 172 lb (78 kg)   BMI Readings from Last 3 Encounters:  03/25/20 25.10 kg/m  01/28/20 25.71 kg/m  12/07/19 25.40 kg/m    Assessment/Interventions: Review of patient past medical history, allergies, medications, health status, including review of consultants reports, laboratory and other test data, was performed as part of comprehensive evaluation and provision of chronic care  management services.   SDOH:  (Social Determinants of Health) assessments and interventions performed: {yes/no:20286}  SDOH Screenings   Alcohol Screen: Low Risk   . Last Alcohol Screening Score (AUDIT): 0  Depression (PHQ2-9): Low Risk   . PHQ-2 Score: 3  Financial Resource Strain: Low Risk   . Difficulty of Paying Living Expenses: Not hard at all  Food Insecurity: No Food Insecurity  . Worried About Charity fundraiser in the Last Year: Never true  . Ran Out of Food in the Last Year: Never true  Housing: Low Risk   . Last Housing Risk Score: 0  Physical Activity: Inactive  . Days of Exercise per Week: 0 days  .  Minutes of Exercise per Session: 0 min  Social Connections: Moderately Isolated  . Frequency of Communication with Friends and Family: More than three times a week  . Frequency of Social Gatherings with Friends and Family: Once a week  . Attends Religious Services: Never  . Active Member of Clubs or Organizations: No  . Attends Archivist Meetings: Never  . Marital Status: Living with partner  Stress: Stress Concern Present  . Feeling of Stress : Rather much  Tobacco Use: High Risk  . Smoking Tobacco Use: Current Every Day Smoker  . Smokeless Tobacco Use: Never Used  Transportation Needs: No Transportation Needs  . Lack of Transportation (Medical): No  . Lack of Transportation (Non-Medical): No    CCM Care Plan  Allergies  Allergen Reactions  . Augmentin [Amoxicillin-Pot Clavulanate] Diarrhea  . Penicillins Itching    Medications Reviewed Today    Reviewed by Josephine Igo, LCSW (Social Worker) on 04/14/20 at 35  Med List Status: <None>  Medication Order Taking? Sig Documenting Provider Last Dose Status Informant  albuterol (VENTOLIN HFA) 108 (90 Base) MCG/ACT inhaler [id number] No INHALE 1 PUFF BY MOUTH AS NEEDED [name], MD Taking Active   blood glucose meter kit and supplies KIT [id number] No Dispense accuchek aviva plus; can change  if needed e11.9 [name], NP Taking Active Self  budesonide-formoterol (SYMBICORT) 160-4.5 MCG/ACT inhaler [id number] No Inhale 2 puffs into the lungs in the morning and at bedtime. Erby Pian, MD Taking Active            Med Note Michaelle Birks, Cathe Mons A   Fri Mar 04, 2020 10:56 AM) Prescribed by Dr. Vella Kohler  carbidopa-levodopa (SINEMET IR) 25-100 MG tablet [id number] No Take 1.5 tablets by mouth 3 (three) times daily. [provider] Taking Active            Med Note Michaelle Birks, Glean Salvo   Fri Mar 04, 2020 10:54 AM) Prescribed by Dr. Manuella Ghazi  cetirizine (ZYRTEC) 10 MG tablet [id number] No Take 10 mg by mouth daily as needed for allergies. [provider] Taking Active Self  colchicine (COLCRYS) 0.6 MG tablet [id number] No Take 1 tablet (0.6 mg total) by mouth daily.  Patient not taking: No sig reported   [name], MD Not Taking Active            Med Note Michaelle Birks, Cathe Mons A   Wed Mar 02, 2020  2:51 PM) Only takes PRN  cyclobenzaprine (FLEXERIL) 10 MG tablet [id number] No Take 10 mg by mouth 3 (three) times daily.  [provider] Taking Active            Med Note Michaelle Birks, Glean Salvo   Fri Mar 04, 2020 10:57 AM) Prescribed by Farris Has, PA-C  DULoxetine (CYMBALTA) 30 MG capsule [id number] No Take 1 capsule (30 mg total) by mouth daily. Ursula Alert, MD Taking Active   furosemide (LASIX) 40 MG tablet [id number] No Take 1 tablet (40 mg total) by mouth daily as needed. [name], NP Taking Active Self           Med Note Michaelle Birks, Lamarr Feenstra A   Fri Mar 04, 2020 10:38 AM)    gabapentin (NEURONTIN) 600 MG tablet [id number] No Take 1 tablet (600 mg total) by mouth 3 (three) times daily. [name], NP Taking Active Self           Med Note Michaelle Birks, Glean Salvo   Fri Mar 04, 2020 10:54 AM) Prescribed by  Dr. Manuella Ghazi  glucose blood test strip [id number] No Use as directed to check blood glucose daily [name], [name], NP Taking Active  Self  guaiFENesin (MUCINEX) 600 MG 12 hr tablet [id number]  Take 1 tablet (600 mg total) by mouth 2 (two) times daily. [name], MD  Active   HUMALOG KWIKPEN 100 UNIT/ML KwikPen [id number] No INJECT SUBCUTANEOUSLY 5 TO  10 UNITS 3 TIMES DAILY  Patient not taking: No sig reported   [name], MD Not Taking Active   hydrOXYzine (VISTARIL) 50 MG capsule [id number] No TAKE 1 CAPSULE BY MOUTH TWICE DAILY AS NEEDED FOR SEVERE ANXIETY SYMPTOMS Eappen, Saramma, MD Taking Active   insulin glargine (LANTUS SOLOSTAR) 100 UNIT/ML Solostar Pen [id number] No Inject 20 Units into the skin daily.  Patient not taking: No sig reported   [name], MD Not Taking Active            Med Note Daron Offer A   Thu Mar 24, 2020 10:29 AM) Only taking PRN in the evenings if her sugars feel elevated  Insulin Pen Needle 32G X 6 MM MISC [id number] No 2 each by Does not apply route daily. [name], MD Taking Active   ipratropium-albuterol (DUONEB) 0.5-2.5 (3) MG/3ML SOLN [id number] No Inhale 3 mLs into the lungs every 6 (six) hours as needed. [name], NP Taking Active Self  levofloxacin (LEVAQUIN) 500 MG tablet [id number]  Take 1 tablet (500 mg total) by mouth daily. [name], MD  Active   lidocaine (LIDODERM) 5 % [id number] No 1 patch every 12 (twelve) hours as needed. [provider] Taking Active            Med Note Michaelle Birks, Glean Salvo   Fri Mar 04, 2020 10:58 AM) Prescribed by Farris Has, PA-C  lubiprostone (AMITIZA) 24 MCG capsule [id number] No Take by mouth 2 (two) times daily as needed. [provider] Taking Active            Med Note Michaelle Birks, Glean Salvo   Fri Mar 04, 2020 10:58 AM) Prescribed by Farris Has, PA-C  metFORMIN (GLUCOPHAGE-XR) 750 MG 24 hr tablet [id number] No Take 2 tablets (1,500 mg total) by mouth daily with breakfast.  Patient taking differently: Take 750 mg by mouth daily with breakfast.   [name], MD Taking Active    montelukast (SINGULAIR) 10 MG tablet [id number] No TAKE 1 TABLET BY MOUTH  DAILY IN THE Keane Police, MD Taking Active   morphine (MS CONTIN) 30 MG 12 hr tablet [id number] No Take 30 mg by mouth every 12 (twelve) hours. [provider] Taking Active            Med Note Michaelle Birks, Glean Salvo   Fri Mar 04, 2020 10:58 AM) Prescribed by Farris Has, PA-C  naproxen sodium (ALEVE) 220 MG tablet [id number] No Take 220 mg by mouth daily as needed. [provider] Taking Active   omeprazole (PRILOSEC) 40 MG capsule [id number] No Take 1 capsule (40 mg total) by mouth daily. [name], MD Taking Active   Oxycodone HCl 10 MG TABS [id number] No Take 10 mg by mouth 4 (four) times daily as needed.  [provider] Taking Active            Med Note Michaelle Birks, Glean Salvo   Fri Mar 04, 2020 10:58 AM) Prescribed by Farris Has, PA-C  promethazine (PHENERGAN) 25 MG tablet [id number] No Take 25 mg by mouth daily as needed. [provider] Taking Active  Med Note Michaelle Birks, Thor Nannini A   Fri Mar 04, 2020 10:59 AM) Prescribed by Dr. Manuella Ghazi  rosuvastatin (CRESTOR) 5 MG tablet [id number] No Take 1 tablet (5 mg total) by mouth at bedtime. [name], MD Taking Active   theophylline (UNIPHYL) 400 MG 24 hr tablet [id number] No Take 1 tablet by mouth daily.  Erby Pian, MD Taking Active Self           Med Note Michaelle Birks, Glean Salvo   Fri Mar 04, 2020 10:59 AM) Prescribed by Dr. Vella Kohler  tiotropium [facility]) 18 MCG inhalation capsule [id number] No Place 1 capsule into inhaler and inhale daily. pm Erby Pian, MD Taking Active Self           Med Note Michaelle Birks, Cathe Mons A   Fri Mar 04, 2020 11:00 AM) Prescribed by Dr. Vella Kohler  traZODone (DESYREL) 100 MG tablet [id number] No Take 1 tablet (100 mg total) by mouth at bedtime. Ursula Alert, MD Taking Active   Ubrogepant (UBRELVY) 100 MG TABS [id number] No Take 100 mg by mouth daily as needed. May repeat in 2  hours [provider] Taking Active            Med Note Michaelle Birks, Glean Salvo   Fri Mar 04, 2020 11:01 AM) Prescribed by Dr. Manuella Ghazi          Patient Active Problem List   Diagnosis Date Noted  . Centrilobular emphysema ([place]) 03/30/2020  . History of prolonged Q-T interval on ECG 11/18/2019  . GAD (generalized anxiety disorder) 10/13/2019  . MDD (major depressive disorder), recurrent episode, moderate ([place]) 10/13/2019  . At risk for long QT syndrome 10/13/2019  . Nonrheumatic mitral valve regurgitation 05/04/2019  . Benign neoplasm of descending colon   . Polyp of sigmoid colon   . Steroid-induced diabetes ([place]) 02/25/2017  . Polyneuropathy 10/21/2014  . Chronic venous insufficiency 10/05/2014  . Bilateral leg edema 08/16/2014  . Major depression in partial remission ([place]) 08/16/2014  . Acid reflux 08/16/2014  . 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 Asthma, moderate persistent 08/16/2014  . Carpal tunnel syndrome 08/16/2014  . Cervical pain 08/16/2014  . CAFL (chronic airflow limitation) ([place]) 08/16/2014  . Type 2 diabetes mellitus with peripheral neuropathy ([place]) 08/16/2014  . Diabetes mellitus type 2, insulin dependent ([place]) 08/16/2014  . Dyslipidemia 08/16/2014  . Tobacco use disorder 08/16/2014  . Essential (primary) hypertension 08/16/2014  . Benign neoplasm of stomach 08/16/2014  . Gout 08/16/2014  . HLD (hyperlipidemia) 08/16/2014  . Low back pain 08/16/2014  . Lumbar radiculopathy 08/16/2014  . Headache, migraine 08/16/2014  . Arthralgia of multiple joints 08/16/2014  . Avitaminosis D 08/16/2014  . Primary osteoarthritis of both knees 06/22/2014  . Benign essential tremor 10/02/2013  . Cervical dystonia 10/02/2013  . Chronic diastolic heart failure ([place]) 11/16/2012  . Chronic pain 11/13/2012    Immunization History  Administered Date(s) Administered  . Influenza,inj,Quad PF,6+ Mos 01/24/2015, 12/19/2016, 10/29/2017, 10/14/2019  .  Influenza-Unspecified 12/09/2015  . PFIZER(Purple Top)SARS-COV-2 Vaccination 05/15/2019, 06/09/2019  . Pneumococcal Polysaccharide-23 02/19/2009  . Tdap 12/09/2012    Conditions to be addressed/monitored:  Hypertension, Hyperlipidemia, Diabetes, Heart Failure, GERD, COPD, Depression, Anxiety, Osteoarthritis, Tobacco use and Chronic Pain, and Parkinson's Disease  There are no care plans that you recently modified to display for this patient.    Medication Assistance: None required.  Patient affirms current coverage meets needs.  Patient's preferred pharmacy is:  [facility]  [place] [address] Phone: [phone] Fax: [place], [address], [address] [place], [place] [address] Phone: [phone] Fax: [phone]  Uses pill box? {Yes or If no, why not?:20788} Pt endorses ***% compliance  We discussed: {Pharmacy options:24294} Patient decided to: {US Pharmacy Plan:23885}  Care Plan and Follow Up Patient Decision:  {FOLLOWUP:24991}  Plan: {CM FOLLOW UP GNOI:37048}  ***   Current Barriers:  . {pharmacybarriers:24917}  Pharmacist Clinical Goal(s):  Marland Kitchen Patient will {PHARMACYGOALCHOICES:24921} through collaboration with PharmD and provider.   Interventions: . 1:1 collaboration with [name], MD regarding development and update of comprehensive plan of care as evidenced by provider attestation and co-signature . Inter-disciplinary care team collaboration (see longitudinal plan of care) . Comprehensive medication review performed; medication list updated in electronic medical record  Heart Failure (Goal: manage symptoms and prevent exacerbations) -{US controlled/uncontrolled:25276} -Last ejection fraction: *** (Date: ***) -HF type: {type of heart failure:30421350} -NYHA Class: {CHL HP Upstream Pharm NYHA Class:[phone]} -AHA HF Stage:  {CHL HP Upstream Pharm AHA HF Stage:[id number]} -Current treatment: . Furosemide 40 mg daily as needed  -Medications previously tried: ***  -Current home BP/HR readings: *** -Current dietary habits: *** -Current exercise habits: *** -Educated on {CCM HF Counseling:25125} -{CCMPHARMDINTERVENTION:25122}    Hyperlipidemia: (LDL goal < ***) -{US controlled/uncontrolled:25276} -Current treatment: . Rosuvastatin 5 mg daily  -Medications previously tried: ***  -Current dietary patterns: *** -Current exercise habits: *** -Educated on {CCM HLD Counseling:25126} -{CCMPHARMDINTERVENTION:25122}  Diabetes (A1c goal {A1c goals:23924}) -{US controlled/uncontrolled:25276} -Current medications: . Humalog (not taking) . Lantus 20 units daily (not taking) . Metformin XR 750 mg 2 tablets daily (taking 1 tablet daily) -Medications previously tried: ***  -Current home glucose readings . fasting glucose: *** . post prandial glucose: *** -{ACTIONS;DENIES/REPORTS:21021675::"Denies"} hypoglycemic/hyperglycemic symptoms -Current meal patterns:  . breakfast: ***  . lunch: ***  . dinner: *** . snacks: *** . drinks: *** -Current exercise: *** -Educated on {CCM DM COUNSELING:25123} -Counseled to check feet daily and get yearly eye exams -{CCMPHARMDINTERVENTION:25122}  Asthma / COPD (Goal: control symptoms and prevent exacerbations) -Uncontrolled -Current treatment  . Symbicort  . Duoneb . Singulair 10 mg daily  . Theophylline 400 mg daily  . Spiriva 18 mcg daily  . Ventolin HFA 1 puff every 6 hours as needed -Medications previously tried: ***  -Gold Grade: Gold 3 (FEV1 30-49%) -Current COPD Classification:  D (high sx, >/=2 exacerbations/yr) -MMRC/CAT score: 27 (March 2022) -Pulmonary function testing: FEV1 58% (2019) -Exacerbations requiring treatment in last 6 months: *** -Patient {Actions; denies-reports:120008} consistent use of maintenance inhaler -Frequency of rescue inhaler use:  *** -Counseled on {CCMINHALERCOUNSELING:25121} -{CCMPHARMDINTERVENTION:25122}  Depression/Anxiety (Goal: ***) -{US controlled/uncontrolled:25276} -Current treatment: . Duloextine 30 mg daily  . Hydroxyzine 50 mg twice daily as needed  . Trazodone 50 + 100 mg daily at bedtime  -Medications previously tried/failed: *** -PHQ9: *** -GAD7: *** -Connected with *** for mental health support -Educated on {CCM mental health counseling:25127} -{CCMPHARMDINTERVENTION:25122}  Tobacco use (Goal Quit smoking) -Uncontrolled -Previous quit attempts: Chantix -Current treatment  . *** -Patient smokes After 30 minutes of waking -Patient triggers include: {Smoking Triggers:23882} -On a scale of 1-10, reports MOTIVATION to quit is *** -On a scale of 1-10, reports CONFIDENCE in quitting is *** -{Smoking Cessation Counseling:23883} -{CCMPHARMDINTERVENTION:25122}  *** (Goal: ***) -{US controlled/uncontrolled:25276} -Current treatment  . *** -Medications previously tried: ***  -{CCMPHARMDINTERVENTION:25122}   Patient Goals/Self-Care Activities . Patient will:  - {pharmacypatientgoals:24919}  Follow Up  Plan: {CM FOLLOW UP DDUK:02542}

## 2020-07-05 DIAGNOSIS — F17210 Nicotine dependence, cigarettes, uncomplicated: Secondary | ICD-10-CM | POA: Diagnosis not present

## 2020-07-05 DIAGNOSIS — R0600 Dyspnea, unspecified: Secondary | ICD-10-CM | POA: Diagnosis not present

## 2020-07-05 DIAGNOSIS — J449 Chronic obstructive pulmonary disease, unspecified: Secondary | ICD-10-CM | POA: Diagnosis not present

## 2020-07-25 ENCOUNTER — Other Ambulatory Visit: Payer: Self-pay | Admitting: *Deleted

## 2020-07-25 DIAGNOSIS — R918 Other nonspecific abnormal finding of lung field: Secondary | ICD-10-CM

## 2020-07-25 DIAGNOSIS — Z87891 Personal history of nicotine dependence: Secondary | ICD-10-CM

## 2020-07-25 NOTE — Progress Notes (Signed)
Contacted and scheduled for LCS nodule follow up scan. Patient is a current smoker with a 42 pack year history.

## 2020-07-26 ENCOUNTER — Ambulatory Visit: Payer: Medicare Other | Attending: Nurse Practitioner

## 2020-07-28 ENCOUNTER — Other Ambulatory Visit: Payer: Self-pay | Admitting: Psychiatry

## 2020-07-28 DIAGNOSIS — F411 Generalized anxiety disorder: Secondary | ICD-10-CM

## 2020-07-28 DIAGNOSIS — F331 Major depressive disorder, recurrent, moderate: Secondary | ICD-10-CM

## 2020-07-28 NOTE — Telephone Encounter (Signed)
Patient needs appointment prior to sending medication refills.

## 2020-08-01 ENCOUNTER — Other Ambulatory Visit: Payer: Self-pay | Admitting: Psychiatry

## 2020-08-01 DIAGNOSIS — F411 Generalized anxiety disorder: Secondary | ICD-10-CM

## 2020-08-01 DIAGNOSIS — F331 Major depressive disorder, recurrent, moderate: Secondary | ICD-10-CM

## 2020-08-03 ENCOUNTER — Telehealth: Payer: Self-pay

## 2020-08-03 NOTE — Telephone Encounter (Signed)
  Care Management   Follow Up Note   08/03/2020 Name: Tricia [name] MRN: [id number] DOB: 21-Sep-1960   Primary Care Provider: [name], MD Reason for referral : Chronic Care Management   An unsuccessful telephone outreach was attempted today. The patient was referred to the case management team for assistance with care management and care coordination.    Home phone rang multiple times without option to leave a voice message. Unable to leave voice message on member's mobile phone due to voice mailbox being full.    Follow Up Plan:  A member of the care management team will attempt to reach [name] within the next week.    C[facility] Care Management [facility] [phone]

## 2020-08-03 NOTE — Telephone Encounter (Signed)
Left message for patient to notify them that it is time to schedule annual low dose lung cancer screening CT scan. Instructed patient to call back ([phone]) to verify information and schedule.  

## 2020-08-11 ENCOUNTER — Telehealth: Payer: Self-pay

## 2020-08-11 NOTE — Telephone Encounter (Signed)
  Care Management   Follow Up Note   08/11/2020 Name: Tricia [name] MRN: [id number] DOB: 1960/10/04   Primary Care Provider: [name], MD Reason for referral : Chronic Care Management   An unsuccessful telephone outreach was attempted today. The patient was referred to the case management team for assistance with care management and care coordination.     Follow Up Plan:  A HIPAA compliant voice message was left today requesting a return call.    Cristy [facility] Care Management [facility] [phone]

## 2020-08-16 ENCOUNTER — Telehealth: Payer: Self-pay

## 2020-08-16 NOTE — Telephone Encounter (Signed)
Patient scheduled for 3 month follow up lung screening CT scan on Thursday July 7th at 2:45.

## 2020-08-17 ENCOUNTER — Other Ambulatory Visit: Payer: Self-pay | Admitting: *Deleted

## 2020-08-17 DIAGNOSIS — Z87891 Personal history of nicotine dependence: Secondary | ICD-10-CM

## 2020-08-17 DIAGNOSIS — R918 Other nonspecific abnormal finding of lung field: Secondary | ICD-10-CM

## 2020-08-17 NOTE — Progress Notes (Signed)
Contacted and scheduled for LCS nodule follow up scan. Patient is a current smoker with a 42 pack year history.

## 2020-08-18 ENCOUNTER — Ambulatory Visit (INDEPENDENT_AMBULATORY_CARE_PROVIDER_SITE_OTHER): Payer: Medicare Other

## 2020-08-18 ENCOUNTER — Other Ambulatory Visit: Payer: Self-pay | Admitting: Psychiatry

## 2020-08-18 DIAGNOSIS — R519 Headache, unspecified: Secondary | ICD-10-CM | POA: Diagnosis not present

## 2020-08-18 DIAGNOSIS — M5416 Radiculopathy, lumbar region: Secondary | ICD-10-CM | POA: Diagnosis not present

## 2020-08-18 DIAGNOSIS — E1142 Type 2 diabetes mellitus with diabetic polyneuropathy: Secondary | ICD-10-CM | POA: Diagnosis not present

## 2020-08-18 DIAGNOSIS — I5032 Chronic diastolic (congestive) heart failure: Secondary | ICD-10-CM

## 2020-08-18 DIAGNOSIS — K5903 Drug induced constipation: Secondary | ICD-10-CM | POA: Diagnosis not present

## 2020-08-18 DIAGNOSIS — G894 Chronic pain syndrome: Secondary | ICD-10-CM | POA: Diagnosis not present

## 2020-08-18 DIAGNOSIS — Z79891 Long term (current) use of opiate analgesic: Secondary | ICD-10-CM | POA: Diagnosis not present

## 2020-08-18 DIAGNOSIS — M542 Cervicalgia: Secondary | ICD-10-CM | POA: Diagnosis not present

## 2020-08-18 DIAGNOSIS — Z79899 Other long term (current) drug therapy: Secondary | ICD-10-CM | POA: Diagnosis not present

## 2020-08-18 DIAGNOSIS — F411 Generalized anxiety disorder: Secondary | ICD-10-CM

## 2020-08-18 DIAGNOSIS — F331 Major depressive disorder, recurrent, moderate: Secondary | ICD-10-CM

## 2020-08-18 DIAGNOSIS — M25569 Pain in unspecified knee: Secondary | ICD-10-CM | POA: Diagnosis not present

## 2020-08-18 NOTE — Chronic Care Management (AMB) (Signed)
  Care Management   Follow Up Note   08/18/2020 Name: Tricia [name] MRN: [id number] DOB: 07-10-1960   Primary Care Provider: [name], MD Reason for referral : Chronic Care Management  [name] was referred to the care management team for assistance with chronic care management and care coordination. Her primary care provider will be notified of our unsuccessful attempts to maintain contact. The care management team will gladly outreach at any time in the future if she is interested in receiving assistance.   Follow Up Plan:  The care management team will gladly follow up with [name] after the primary care provider has a conversation with her regarding recommendation for care management engagement and subsequent re-referral for care management services.    [facility] Care Management [facility] [phone]

## 2020-08-25 ENCOUNTER — Other Ambulatory Visit: Payer: Self-pay

## 2020-08-25 ENCOUNTER — Ambulatory Visit
Admission: RE | Admit: 2020-08-25 | Discharge: 2020-08-25 | Disposition: A | Discharge status: Home / Self Care | Payer: Medicare Other | Source: Ambulatory Visit | Attending: Nurse Practitioner | Admitting: Nurse Practitioner

## 2020-08-25 DIAGNOSIS — R911 Solitary pulmonary nodule: Secondary | ICD-10-CM | POA: Diagnosis not present

## 2020-08-25 DIAGNOSIS — J439 Emphysema, unspecified: Secondary | ICD-10-CM | POA: Diagnosis not present

## 2020-08-25 DIAGNOSIS — R918 Other nonspecific abnormal finding of lung field: Secondary | ICD-10-CM | POA: Insufficient documentation

## 2020-08-25 DIAGNOSIS — Z87891 Personal history of nicotine dependence: Secondary | ICD-10-CM | POA: Insufficient documentation

## 2020-08-25 DIAGNOSIS — I700 Atherosclerosis of aorta: Secondary | ICD-10-CM | POA: Diagnosis not present

## 2020-08-29 ENCOUNTER — Telehealth: Payer: Self-pay | Admitting: Family Medicine

## 2020-08-29 ENCOUNTER — Ambulatory Visit: Payer: Self-pay | Admitting: *Deleted

## 2020-08-29 ENCOUNTER — Other Ambulatory Visit: Payer: Self-pay | Admitting: Family Medicine

## 2020-08-29 ENCOUNTER — Encounter: Payer: Self-pay | Admitting: Family Medicine

## 2020-08-29 NOTE — Telephone Encounter (Signed)
Third attempt to reach patient no answer- left message to call office 

## 2020-08-29 NOTE — Telephone Encounter (Unsigned)
Patient viewed My Chart and stated she thinks her results states she has bronchitis and wold like PCP to send in antibiotics. Patient is coughing up green phlegm.    [place], [place]. Phone:  [phone]  Fax:  [phone]

## 2020-08-29 NOTE — Telephone Encounter (Signed)
Second attempt to reach patient- left message on voice mail.

## 2020-08-29 NOTE — Telephone Encounter (Signed)
Attempted to call patient- no answer. Triage call opened and put in call back basket.

## 2020-08-29 NOTE — Telephone Encounter (Signed)
Reason for Disposition  [1] Known COPD or other severe lung disease (i.e., bronchiectasis, cystic fibrosis, lung surgery) AND [2] worsening symptoms (i.e., increased sputum purulence or amount, increased breathing difficulty  Answer Assessment - Initial Assessment Questions 1. ONSET: "When did the cough begin?"      3 weeks ago 2. SEVERITY: "How bad is the cough today?"      Coughs with activity 3. SPUTUM: "Describe the color of your sputum" (none, dry cough; clear, white, yellow, green)     Thick green sputum 4. HEMOPTYSIS: "Are you coughing up any blood?" If so ask: "How much?" (flecks, streaks, tablespoons, etc.)     no 5. DIFFICULTY BREATHING: "Are you having difficulty breathing?" If Yes, ask: "How bad is it?" (e.g., mild, moderate, severe)    - MILD: No SOB at rest, mild SOB with walking, speaks normally in sentences, can lie down, no retractions, pulse < 100.    - MODERATE: SOB at rest, SOB with minimal exertion and prefers to sit, cannot lie down flat, speaks in phrases, mild retractions, audible wheezing, pulse 100-120.    - SEVERE: Very SOB at rest, speaks in single words, struggling to breathe, sitting hunched forward, retractions, pulse > 120      Moderate- using inhaler and duo neb-  feels she is having COPD flare 6. FEVER: "Do you have a fever?" If Yes, ask: "What is your temperature, how was it measured, and when did it start?"     no 7. CARDIAC HISTORY: "Do you have any history of heart disease?" (e.g., heart attack, congestive heart failure)      no 8. LUNG HISTORY: "Do you have any history of lung disease?"  (e.g., pulmonary embolus, asthma, emphysema)     COPD, emphysema 9. PE RISK FACTORS: "Do you have a history of blood clots?" (or: recent major surgery, recent prolonged travel, bedridden)     no 10. OTHER SYMPTOMS: "Do you have any other symptoms?" (e.g., runny nose, wheezing, chest pain)       Runny nose 11. PREGNANCY: "Is there any chance you are pregnant?" "When  was your last menstrual period?"       N/a 12. TRAVEL: "Have you traveled out of the country in the last month?" (e.g., travel history, exposures)       no  Protocols used: Cough - Acute Productive-A-AH

## 2020-08-29 NOTE — Telephone Encounter (Signed)
Patient viewed My Chart and stated she thinks her results states she has bronchitis and wold like PCP to send in antibiotics. Patient is coughing up green phlegm.     [place], [place]. Phone: [phone]  Fax: [phone]      Attempted to call patient- constant busy signal. Call to mobile number- no answer- mailbox full- unable to leave message. Put in call back basket.

## 2020-09-07 ENCOUNTER — Telehealth: Payer: Self-pay | Admitting: *Deleted

## 2020-09-07 ENCOUNTER — Other Ambulatory Visit: Payer: Self-pay | Admitting: Family Medicine

## 2020-09-07 DIAGNOSIS — E041 Nontoxic single thyroid nodule: Secondary | ICD-10-CM

## 2020-09-07 NOTE — Telephone Encounter (Signed)
Notified patient of LDCT lung cancer screening program results with recommendation for 12 month follow up imaging. Also notified of incidental findings noted below and is encouraged to discuss further with PCP who will receive a copy of this note and/or the CT report. Patient verbalizes understanding.  Specifically discussed the possibility of infection/ aspiration as well as the thyroid findings. Patient reports that her thyroid was evaluated recently and that she already has an appt with her PCP soon where she will discuss the other findings.   IMPRESSION: 1. Lung-RADS 2, benign appearance or behavior. Continue annual screening with low-dose chest CT without contrast in 12 months. 2. Bilateral lower lobe mucoid impaction and increased ill-defined peribronchovascular nodularity, findings indicative of aspiration. 3. Steatotic cirrhotic liver. 4. 2.1 cm right thyroid nodule. Recommend thyroid ultrasound. (Ref: J Am Coll Radiol. 2015 Feb;12(2): 143-50). 5. Aortic atherosclerosis (ICD10-I70.0). Coronary artery calcification. 6.  Emphysema (ICD10-J43.9).

## 2020-09-14 ENCOUNTER — Telehealth: Payer: Self-pay

## 2020-09-14 NOTE — Progress Notes (Signed)
Chronic Care Management Pharmacy Assistant   Name: Tricia [name]  MRN: [id number] DOB: 13-Jul-1960  Reason for Encounter: Hypertension and CHF Disease State Call  Recent office visits:  No recent office visits  Recent consult visits:  07/05/2020 Wallene Huh, MD (Pulmonology) for follow-up- No medication changes noted. Patient instructed to follow-up in four months.  Hospital visits:  None in previous 6 months  Medications: Outpatient Encounter Medications as of 09/14/2020  Medication Sig Note   blood glucose meter kit and supplies KIT Dispense accuchek aviva plus; can change if needed e11.9    budesonide-formoterol (SYMBICORT) 160-4.5 MCG/ACT inhaler Inhale 2 puffs into the lungs in the morning and at bedtime. 03/04/2020: Prescribed by Dr. Vella Kohler   carbidopa-levodopa (SINEMET IR) 25-100 MG tablet Take 1.5 tablets by mouth 3 (three) times daily. 03/04/2020: Prescribed by Dr. Manuella Ghazi   cetirizine (ZYRTEC) 10 MG tablet Take 10 mg by mouth daily as needed for allergies.    colchicine (COLCRYS) 0.6 MG tablet Take 1 tablet (0.6 mg total) by mouth daily. (Patient not taking: No sig reported) 03/02/2020: Only takes PRN   cyclobenzaprine (FLEXERIL) 10 MG tablet Take 10 mg by mouth 3 (three) times daily.  03/04/2020: Prescribed by Farris Has, PA-C   DULoxetine (CYMBALTA) 30 MG capsule TAKE 1 CAPSULE BY MOUTH ONCE DAILY. *NEEDS APPOINTMENT FOR FURTHER FILLS*    furosemide (LASIX) 40 MG tablet Take 1 tablet (40 mg total) by mouth daily as needed.    gabapentin (NEURONTIN) 600 MG tablet Take 1 tablet (600 mg total) by mouth 3 (three) times daily. 03/04/2020: Prescribed by Dr. Manuella Ghazi   glucose blood test strip Use as directed to check blood glucose daily    guaiFENesin (MUCINEX) 600 MG 12 hr tablet Take 1 tablet (600 mg total) by mouth 2 (two) times daily.    HUMALOG KWIKPEN 100 UNIT/ML KwikPen INJECT SUBCUTANEOUSLY 5 TO  10 UNITS 3 TIMES DAILY (Patient not taking: No sig reported)     hydrOXYzine (VISTARIL) 50 MG capsule TAKE 1 CAPSULE BY MOUTH TWICE DAILY AS NEEDED FOR SEVERE ANXIETY SYMPTOMS    insulin glargine (LANTUS SOLOSTAR) 100 UNIT/ML Solostar Pen Inject 20 Units into the skin daily. (Patient not taking: No sig reported) 03/24/2020: Only taking PRN in the evenings if her sugars feel elevated   Insulin Pen Needle 32G X 6 MM MISC 2 each by Does not apply route daily.    ipratropium-albuterol (DUONEB) 0.5-2.5 (3) MG/3ML SOLN Inhale 3 mLs into the lungs every 6 (six) hours as needed.    levofloxacin (LEVAQUIN) 500 MG tablet Take 1 tablet (500 mg total) by mouth daily.    lidocaine (LIDODERM) 5 % 1 patch every 12 (twelve) hours as needed. 03/04/2020: Prescribed by Farris Has, PA-C   lubiprostone (AMITIZA) 24 MCG capsule Take by mouth 2 (two) times daily as needed. 03/04/2020: Prescribed by Farris Has, PA-C   metFORMIN (GLUCOPHAGE-XR) 750 MG 24 hr tablet Take 2 tablets (1,500 mg total) by mouth daily with breakfast. (Patient taking differently: Take 750 mg by mouth daily with breakfast.)    montelukast (SINGULAIR) 10 MG tablet TAKE 1 TABLET BY MOUTH  DAILY IN THE AFTERNOON    morphine (MS CONTIN) 30 MG 12 hr tablet Take 30 mg by mouth every 12 (twelve) hours. 03/04/2020: Prescribed by Farris Has, PA-C   naproxen sodium (ALEVE) 220 MG tablet Take 220 mg by mouth daily as needed.    omeprazole (PRILOSEC) 40 MG capsule Take 1 capsule (40 mg total) by  mouth daily.    Oxycodone HCl 10 MG TABS Take 10 mg by mouth 4 (four) times daily as needed.  03/04/2020: Prescribed by Farris Has, PA-C   promethazine (PHENERGAN) 25 MG tablet Take 25 mg by mouth daily as needed. 03/04/2020: Prescribed by Dr. Manuella Ghazi   rosuvastatin (CRESTOR) 5 MG tablet Take 1 tablet (5 mg total) by mouth at bedtime.    theophylline (UNIPHYL) 400 MG 24 hr tablet Take 1 tablet by mouth daily.  03/04/2020: Prescribed by Dr. Vella Kohler   tiotropium (SPIRIVA) 18 MCG inhalation capsule Place 1 capsule into inhaler and inhale  daily. pm 03/04/2020: Prescribed by Dr. Vella Kohler   traZODone (DESYREL) 100 MG tablet Take 1 tablet (100 mg total) by mouth at bedtime.    traZODone (DESYREL) 50 MG tablet TAKE 1 TABLET BY MOUTH AT  BEDTIME    Ubrogepant (UBRELVY) 100 MG TABS Take 100 mg by mouth daily as needed. May repeat in 2 hours 03/04/2020: Prescribed by Dr. Ronnell Guadalajara HFA 108 (90 Base) MCG/ACT inhaler INHALE 1 PUFF BY MOUTH AS NEEDED    No facility-administered encounter medications on file as of 09/14/2020.   Care Gaps: MAMMOGRAM Zoster Vaccines- Shingrix  PAP SMEAR-Modifier Pneumococcal Vaccine 9-53 Years old (last completed 02/19/2009) COVID-19 Dose 3 OPHTHALMOLOGY EXAM (last completed 02/27/2019) HEMOGLOBIN A1C (last completed 12/07/2019) URINE MICROALBUMIN (last completed 08/05/2019)  Star Rating Drugs: Rosuvastatin 5 mg last filled on 12/07/2019 for a 90-Day supply with [facility] Delivery Metformin 750 mg last filled on 12/07/2019 for a 90-Day supply with [facility] to inquire about patients prescriptions and spoke to [facility] whom confirmed that the patient has not had these prescriptions filled with [facility] since 2021 and she will need a new Rx.  Reviewed chart prior to disease state call. Spoke with patient regarding BP  Recent Office Vitals: BP Readings from Last 3 Encounters:  02/19/20 116/73  01/28/20 118/72  12/07/19 100/60   Pulse Readings from Last 3 Encounters:  02/19/20 100  01/28/20 99  12/07/19 96    Wt Readings from Last 3 Encounters:  03/25/20 170 lb (77.1 kg)  01/28/20 174 lb 1.6 oz (79 kg)  12/07/19 172 lb (78 kg)     Kidney Function Lab Results  Component Value Date/Time   CREATININE 0.85 08/05/2019 03:04 PM   CREATININE 1.04 (H) 08/27/2018 02:01 PM   CREATININE 0.89 10/29/2017 03:19 PM   GFRNONAA 76 08/05/2019 03:04 PM   GFRAA 88 08/05/2019 03:04 PM    BMP Latest Ref Rng & Units 08/05/2019 08/27/2018 10/29/2017  Glucose 65 - 99 mg/dL 266(H)  119(H) 475(H)  BUN 7 - 25 mg/dL 10 17 9   Creatinine 0.50 - 1.05 mg/dL 0.85 1.04(H) 0.89  BUN/Creat Ratio 6 - 22 (calc) NOT APPLICABLE - NOT APPLICABLE  Sodium 834 - 146 mmol/L 136 139 136  Potassium 3.5 - 5.3 mmol/L 4.0 3.9 3.6  Chloride 98 - 110 mmol/L 103 103 101  CO2 20 - 32 mmol/L 26 25 28   Calcium 8.6 - 10.4 mg/dL 8.9 8.9 8.7    Current antihypertensive regimen:  Furosemide 40 mg daily PRN   How often are you checking your Blood Pressure?  Patient states that she does not have a blood pressure monitor will try to get her a prescription for one.  Current home BP readings: N/A  What recent interventions/DTPs have been made by any provider to improve Blood Pressure control since last CPP Visit: None ID  Any recent hospitalizations  or ED visits since last visit with CPP? No  What diet changes have been made to improve Blood Pressure Control?  Patient states that she does not have a certain diet but she does watch her sugar, salt, starch, and carb intake.   What exercise is being done to improve your Blood Pressure Control?  Nothing specific. Patient reports that she does have back issues  Adherence Review: Is the patient currently on ACE/ARB medication? No Does the patient have >5 day gap between last estimated fill dates? Yes  Recent Relevant Labs: Lab Results  Component Value Date/Time   HGBA1C 6.9 (A) 12/07/2019 02:18 PM   HGBA1C 6.8 (H) 08/05/2019 03:04 PM   HGBA1C 6.7 (H) 08/27/2018 02:01 PM   HGBA1C 13.7 (A) 03/19/2018 02:04 PM   HGBA1C 14.0 (A) 10/29/2017 02:18 PM   HGBA1C 7.4 (H) 10/09/2012 04:52 AM   HGBA1C 7.1 (H) 07/02/2012 05:33 AM   MICROALBUR 1.2 08/05/2019 03:04 PM   MICROALBUR 50 03/19/2018 02:07 PM   MICROALBUR 2.0 02/20/2017 11:55 AM   MICROALBUR 100 01/24/2015 12:22 PM    Kidney Function Lab Results  Component Value Date/Time   CREATININE 0.85 08/05/2019 03:04 PM   CREATININE 1.04 (H) 08/27/2018 02:01 PM   CREATININE 0.89 10/29/2017 03:19 PM    GFRNONAA 76 08/05/2019 03:04 PM   GFRAA 88 08/05/2019 03:04 PM    Current antihyperglycemic regimen:  Humalog Kwickpen 100 unit inject 5-10 units 3 times daily Metformin 750 mg 2 tablets daily Lantus Solostat 100 units Inject 20 units daily  What recent interventions/DTPs have been made to improve glycemic control:  None ID Have there been any recent hospitalizations or ED visits since last visit with CPP? No  Patient denies hypoglycemic symptoms, including Pale, Sweaty, Shaky, Hungry, Nervous/irritable, and Vision changes  Patient denies hyperglycemic symptoms, including blurry vision, excessive thirst, fatigue, polyuria, and weakness  How often are you checking your blood sugar? twice daily  What are your blood sugars ranging?  She reports her last reading was 132 before a meal. Patient states she does have highs but not many lows  During the week, how often does your blood glucose drop below 70? Never  Are you checking your feet daily/regularly? Yes her feet are good  Patient is also inquiring about a Dexacome I informed her that I would let her provider know, but she does need a current A1C. She stated that she has an appointment on the 8th with PCP and is willing to have labs done prior to this date.   CHF Questions  Do you monitor your weight every day? No patient states she does not monitor her weight but she does appear to be loosing and not gaining.  Have you noticed any changes in your weight or fluid status ( Greater than 2 pounds in one day; 5 pounds in one week) ? None   How often are you taking your fluid pill? Her Furoseminde 40 mg is take as needed. She stated that she does not take daily but she did take one this morning as when she woke up her feet appeared to be retaining fluid.  Patient also reports that she has been having UTI symptoms X 1 month. She stated that it does hurt for her to urinate, and when she goes she only uses it a little. She states her urine  is yellow, but cloudy. Informed her that I would contact the PCP's Office and have a nurse give her a call. She also reports  that she has a [place] for her Thyroid on 09/26/2020 as well as there is a mass on her thyroid that they want to look at.  Contacted Dr. [name] office and spoke with Santiago Glad and she stated that she would get a message over to the nurses regarding the patients UTI symptoms, and also she will send a message regarding the patient wanting a Dexacome and for her to get a blood pressure machine ordered. I informed Santiago Glad that per patient she is willing to have labs doen prior to her appointment with Dr. [name].   Patient was scheduled with Junius Argyle, CPP on 09/08  AWV scheduled for 01/31/2021   Lynann Bologna, CPA/CMA Clinical Pharmacist Assistant Phone: [phone]

## 2020-09-15 DIAGNOSIS — M542 Cervicalgia: Secondary | ICD-10-CM | POA: Diagnosis not present

## 2020-09-15 DIAGNOSIS — M5416 Radiculopathy, lumbar region: Secondary | ICD-10-CM | POA: Diagnosis not present

## 2020-09-15 DIAGNOSIS — Z79899 Other long term (current) drug therapy: Secondary | ICD-10-CM | POA: Diagnosis not present

## 2020-09-15 DIAGNOSIS — M25569 Pain in unspecified knee: Secondary | ICD-10-CM | POA: Diagnosis not present

## 2020-09-15 DIAGNOSIS — K5903 Drug induced constipation: Secondary | ICD-10-CM | POA: Diagnosis not present

## 2020-09-15 DIAGNOSIS — Z79891 Long term (current) use of opiate analgesic: Secondary | ICD-10-CM | POA: Diagnosis not present

## 2020-09-15 DIAGNOSIS — R519 Headache, unspecified: Secondary | ICD-10-CM | POA: Diagnosis not present

## 2020-09-15 DIAGNOSIS — G894 Chronic pain syndrome: Secondary | ICD-10-CM | POA: Diagnosis not present

## 2020-09-16 ENCOUNTER — Telehealth: Payer: Self-pay

## 2020-09-16 NOTE — Telephone Encounter (Signed)
done

## 2020-09-16 NOTE — Telephone Encounter (Signed)
Called patient per Dr. [name] her visit needed to change to in person and all labs and orders could be discussed then.  As far as UTI she was instructed to do mychart virtual visit or urgent care.  Melissa please change visit type to in patient

## 2020-09-16 NOTE — Telephone Encounter (Signed)
Copied from [place] [phone]. Topic: General - Other >> Sep 16, 2020  1:23 PM Leward Quan A wrote: Reason for CRM: [name] assistant to [name] the clinical pharmacist called in to inform Dr [name] that patient complained of possible UTI has yellow cloudy urine and very little output when she go to the restroom, also patient request a  Dexacom unit for diabetes, willing to have labs prior to her upcoming appointment. Also patient would like to get a blood pressure machine so that she can take her BP at home. Asking that nurse reach out to patient today please at Ph#  [phone]

## 2020-09-23 NOTE — Progress Notes (Signed)
Name: RASA DEGRAZIA   MRN: [id number]    DOB: 12/08/1960   Date:09/26/2020       Progress Note  Subjective  Chief Complaint  Follow Up  HPI    Parkinson's: she was diagnosed in 2021 by Dr. Trena Platt PA  with right hemibody parkinson's . She is taking Sinemet IR and is stable.    Diabetes insulin requiring: she states she has been taking medication but not checking glucose often.  A1C has been at goal .  She has dyslipidemia, and microalbuminuria.  She denies polyphagia, polydipsia or polyuria. Glucose at home around 150's, she takes Lantus and Metformin daily  ( but I have not filled rx since last year), using Pre meal insulin a couple of times a day. She has associated dyslipidemia  and CKI , we will recheck labs, she is on statin therapy , we will add Farxiga and decrease dose of Metformin from 1500 mg ER to 1000 mg daily    Chronic pain: sees pain clinic, currently going to [facility], she states pain is still up and down, worse pain is on lumbar spine, average pain 6/10 , but can go up to 8/10 . Also has pain on both knees sometimes gets red and swollen   Gout: she has flares intermittently, had two episodes in the past 6 months, she needs refill of colchicine.    Asthma and COPD and chronic airflow limitation:  under the care of Dr. Raul Del. She had CT chest done, seen by pulmonologist and referred to Endo by Dr. Raul Del for evaluation of thyroid nodule. She continues to have a productive cough that is worse in am's and at night and also SOB Not on oxygen yet, still smoking and not ready to quit    CT chest from 03/17/2019  1. Insert lung rads 2 2. Bronchial wall thickening with fluid/debris in segmental and subsegmental airways to the right lower lobe. This may reflect infectious/inflammatory etiology but in the appropriate clinical setting, aspiration could have this appearance. 3. 1.8 cm right thyroid nodule. Recommend thyroid US.(Ref: J Am Coll Radiol. 2015  Feb;12(2): 143-50). 4.  Emphysema (ICD10-J43.9) and Aortic Atherosclerosis (ICD10-170.0)    Thyroid nodule: had US done by Dr. Manfred Shirts at [facility] clinic 06/2019, she states they decided not to do a biopsy , repeat US today does not seem to indicate need for a biopsy, small lymphonodo on left side, seems reactive, but with weight loss, may need a biopsy    Impression:  Right mid cystic thyroid nodule measuring 1.8 cm  Bilateral enlarged lymph nodes, several of which have a more rounded,  atypical shape (see above)   Major Depression: chronic and recurrent, phq 9 is high. . She lives with boyfriend and he has DM and also some other medical problems, she also worries about her daughter's the youngest one has multiple medical problems recently diagnosed with colon cancer and has lynch syndrome , also has a daughter in [place] that is a drug addict. She is on Disability. Advised her to confirm diagnosis and go back for colonoscopy sooner    CHF: doing well at this time,  she has orthopnea - she uses two pillows . She used to see Dr. Fletcher Anon but is currently seeing  Dr. Ubaldo Glassing and is on daily lasix now , she is not on ACE or ARB, on statin therapy . We will add Farxiga today    Echo from 06/05/2019  NORMAL LEFT VENTRICULAR SYSTOLIC FUNCTION  NORMAL RIGHT  VENTRICULAR SYSTOLIC FUNCTION  MILD VALVULAR REGURGITATION (See above)  NO VALVULAR STENOSIS  Closest EF: >55% (Estimated)  Aortic: MILD AR  Mitral: MILD MR  Tricuspid: MILD TR  Echo showed enlarged left and right atrium   Atherosclerosis of Aorta: discussed CT results discussed statin therapy and aspirin . Stable  Senile purpura: on both arms , reassurance given    Protein malnutrition: weight loss, without dieting , loss of 9 lbs in the past couple of months, discussed importance of looking for cancer, but she refuses it.    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/25/2017   Polyneuropathy 10/21/2014   Chronic venous insufficiency 10/05/2014   Bilateral leg edema 08/16/2014   Major depression in partial remission ([place]) 08/16/2014   Acid reflux 08/16/2014   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   Asthma, moderate persistent 08/16/2014   Carpal tunnel syndrome 08/16/2014   Cervical pain 08/16/2014   CAFL (chronic airflow limitation) ([place]) 08/16/2014   Type 2 diabetes mellitus with peripheral neuropathy ([place]) 08/16/2014   Diabetes mellitus type 2, insulin dependent ([place]) 08/16/2014   Dyslipidemia 08/16/2014   Tobacco use disorder 08/16/2014   Essential (primary) hypertension 08/16/2014   Benign neoplasm of stomach 08/16/2014   Gout 08/16/2014   HLD (hyperlipidemia) 08/16/2014   Low back pain 08/16/2014   Lumbar radiculopathy 08/16/2014   Headache, migraine 08/16/2014   Arthralgia of multiple joints 08/16/2014   Avitaminosis D 08/16/2014   Primary osteoarthritis of both knees 06/22/2014   Benign essential tremor 10/02/2013   Cervical dystonia 10/02/2013   Chronic diastolic heart failure ([place]) 11/16/2012   Chronic pain 11/13/2012    Past Surgical History:  Procedure Laterality Date   CARPAL TUNNEL RELEASE Bilateral    x2 right, 1x on left   COLONOSCOPY     COLONOSCOPY WITH PROPOFOL N/A 04/25/2017   Procedure: COLONOSCOPY WITH PROPOFOL;  Surgeon: [name], MD;  Location: [place];  Service: Endoscopy;  Laterality: N/A;  diabetic-oral med   DILATION AND CURETTAGE OF UTERUS     EXTERNAL EAR SURGERY Left    x2   POLYPECTOMY   04/25/2017   Procedure: POLYPECTOMY INTESTINAL;  Surgeon: [name], MD;  Location: [place];  Service: Endoscopy;;   SPINE SURGERY     herniated disc   TUBAL LIGATION      Family History  Problem Relation Age of Onset   Emphysema Mother    Anxiety disorder Mother    Stroke Father    Throat cancer Father    Multiple sclerosis Daughter    Bipolar disorder Daughter    Cervical cancer Daughter    Bipolar disorder Daughter    Drug abuse Daughter    Lung cancer Maternal Aunt    Lung cancer Maternal Uncle    Lung cancer Maternal Grandmother    Lung cancer Maternal Grandfather     Social History   Tobacco Use   Smoking status: Every Day    Packs/day: 1.00    Years: 41.00    Pack years: 41.00  Types: Cigarettes    Start date: 05/19/1977   Smokeless tobacco: Never  Substance Use Topics   Alcohol use: No    Alcohol/week: 0.0 standard drinks     Current Outpatient Medications:    blood glucose meter kit and supplies KIT, Dispense accuchek aviva plus; can change if needed e11.9, Disp: 1 each, Rfl: 0   budesonide-formoterol (SYMBICORT) 160-4.5 MCG/ACT inhaler, Inhale 2 puffs into the lungs in the morning and at bedtime., Disp: , Rfl:    carbidopa-levodopa (SINEMET IR) 25-100 MG tablet, Take 1.5 tablets by mouth 3 (three) times daily., Disp: , Rfl:    cetirizine (ZYRTEC) 10 MG tablet, Take 10 mg by mouth daily as needed for allergies., Disp: , Rfl:    Continuous Blood Gluc Sensor (DEXCOM G6 SENSOR) MISC, 1 each by Does not apply route as directed. Every 10 days, Disp: 3 each, Rfl: 5   Continuous Blood Gluc Transmit (DEXCOM G6 TRANSMITTER) MISC, 1 each by Does not apply route every 3 (three) months., Disp: 1 each, Rfl: EA   cyclobenzaprine (FLEXERIL) 10 MG tablet, Take 10 mg by mouth 3 (three) times daily. , Disp: , Rfl:    DULoxetine (CYMBALTA) 30 MG capsule, TAKE 1 CAPSULE BY MOUTH ONCE DAILY. *NEEDS APPOINTMENT FOR FURTHER FILLS*, Disp: 15 capsule, Rfl: 0    Empagliflozin-metFORMIN HCl (SYNJARDY) 12.5-500 MG TABS, Take 1 tablet by mouth 2 (two) times daily. In place of Metformin, Disp: 180 tablet, Rfl: 0   furosemide (LASIX) 40 MG tablet, Take 1 tablet (40 mg total) by mouth daily as needed., Disp: 30 tablet, Rfl: 2   gabapentin (NEURONTIN) 600 MG tablet, Take 1 tablet (600 mg total) by mouth 3 (three) times daily., Disp: 90 tablet, Rfl: 3   glucose blood test strip, Use as directed to check blood glucose daily, Disp: 100 each, Rfl: 2   HUMALOG KWIKPEN 100 UNIT/ML KwikPen, INJECT SUBCUTANEOUSLY 5 TO  10 UNITS 3 TIMES DAILY, Disp: 30 mL, Rfl: 2   hydrOXYzine (VISTARIL) 50 MG capsule, TAKE 1 CAPSULE BY MOUTH TWICE DAILY AS NEEDED FOR SEVERE ANXIETY SYMPTOMS, Disp: 60 capsule, Rfl: 1   insulin glargine (LANTUS SOLOSTAR) 100 UNIT/ML Solostar Pen, Inject 20 Units into the skin daily., Disp: 15 pen, Rfl: 1   Insulin Pen Needle 32G X 6 MM MISC, 2 each by Does not apply route daily., Disp: 200 each, Rfl: 2   ipratropium-albuterol (DUONEB) 0.5-2.5 (3) MG/3ML SOLN, Inhale 3 mLs into the lungs every 6 (six) hours as needed., Disp: 360 mL, Rfl: 3   lidocaine (LIDODERM) 5 %, 1 patch every 12 (twelve) hours as needed., Disp: , Rfl:    lubiprostone (AMITIZA) 24 MCG capsule, Take by mouth 2 (two) times daily as needed., Disp: , Rfl:    montelukast (SINGULAIR) 10 MG tablet, TAKE 1 TABLET BY MOUTH  DAILY IN THE AFTERNOON, Disp: 90 tablet, Rfl: 2   morphine (MS CONTIN) 30 MG 12 hr tablet, Take 30 mg by mouth every 12 (twelve) hours., Disp: , Rfl:    nitrofurantoin, macrocrystal-monohydrate, (MACROBID) 100 MG capsule, Take 1 capsule (100 mg total) by mouth 2 (two) times daily., Disp: 10 capsule, Rfl: 0   Oxycodone HCl 10 MG TABS, Take 10 mg by mouth 4 (four) times daily as needed. , Disp: , Rfl:    promethazine (PHENERGAN) 25 MG tablet, Take 25 mg by mouth daily as needed., Disp: , Rfl:    theophylline (UNIPHYL) 400 MG 24 hr tablet, Take 1 tablet by mouth daily. , Disp: ,  Rfl:    tiotropium (SPIRIVA) 18 MCG inhalation capsule, Place 1 capsule into inhaler and inhale daily. pm, Disp: , Rfl:    traZODone (DESYREL) 100 MG tablet, Take 1 tablet (100 mg total) by mouth at bedtime., Disp: 90 tablet, Rfl: 0   Ubrogepant (UBRELVY) 100 MG TABS, Take 100 mg by mouth daily as needed. May repeat in 2 hours, Disp: , Rfl:    VENTOLIN HFA 108 (90 Base) MCG/ACT inhaler, INHALE 1 PUFF BY MOUTH AS NEEDED, Disp: 18 g, Rfl: 0   Zoster Vaccine Adjuvanted (SHINGRIX) injection, Inject 0.5 mLs into the muscle once for 1 dose., Disp: 0.5 mL, Rfl: 1   colchicine (COLCRYS) 0.6 MG tablet, Take 1 tablet (0.6 mg total) by mouth daily., Disp: 30 tablet, Rfl: 0   rosuvastatin (CRESTOR) 5 MG tablet, Take 1 tablet (5 mg total) by mouth at bedtime., Disp: 90 tablet, Rfl: 1  Allergies  Allergen Reactions   Augmentin [Amoxicillin-Pot Clavulanate] Diarrhea   Penicillins Itching    I personally reviewed active problem list, medication list, allergies, family history, social history, health maintenance with the patient/caregiver today.   ROS  Constitutional: Negative for fever, positive for weight change.  Respiratory: Positive  for cough and shortness of breath.   Cardiovascular: Negative for chest pain or palpitations.  Gastrointestinal: Negative for abdominal pain, no bowel changes.  Musculoskeletal: positive or gait problem and  joint swelling.  Skin: Negative for rash.  Neurological: Negative for dizziness , positive for intermittent headache.  No other specific complaints in a complete review of systems (except as listed in HPI above).   Objective  Vitals:   09/26/20 1502  BP: 128/70  Pulse: 98  Resp: 16  Temp: 97.9 F (36.6 C)  SpO2: 98%  Weight: 159 lb (72.1 kg)  Height: $Remove'5\' 9"'FwHWyKR$  (1.753 m)    Body mass index is 23.48 kg/m.  Physical Exam  Constitutional: Patient appears thin, some temporal waisting No distress.  HEENT: head atraumatic, normocephalic, pupils equal and  reactive to light, neck supple Cardiovascular: Normal rate, regular rhythm and normal heart sounds.  No murmur heard. No BLE edema. Pulmonary/Chest: Effort normal , she has left posterior wheezing . No respiratory distress. Abdominal: Soft.  There is no tenderness. CVA tenderness  Psychiatric: Patient has a normal mood and affect. behavior is normal. Judgment and thought content normal.   Recent Results (from the past 2160 hour(s))  POCT HgB A1C     Status: Abnormal   Collection Time: 09/26/20  3:07 PM  Result Value Ref Range   Hemoglobin A1C 7.0 (A) 4.0 - 5.6 %   HbA1c POC (<> result, manual entry)     HbA1c, POC (prediabetic range)     HbA1c, POC (controlled diabetic range)    POCT Urinalysis Dipstick     Status: Abnormal   Collection Time: 09/26/20  3:07 PM  Result Value Ref Range   Color, UA Yellow    Clarity, UA Cloudy    Glucose, UA Negative Negative   Bilirubin, UA Negative    Ketones, UA Negative    Spec Grav, UA 1.025 1.010 - 1.025   Blood, UA Moderate    pH, UA 7.0 5.0 - 8.0   Protein, UA Positive (A) Negative   Urobilinogen, UA 0.2 0.2 or 1.0 E.U./dL   Nitrite, UA Negative    Leukocytes, UA Small (1+) (A) Negative   Appearance     Odor      Diabetic Foot Exam: Diabetic Foot Exam - Simple  Simple Foot Form Diabetic Foot exam was performed with the following findings: Yes   Visual Inspection See comments: Yes Sensation Testing See comments: Yes Pulse Check Posterior Tibialis and Dorsalis pulse intact bilaterally: Yes Comments Decrease in sensation on right big toe, thick toenails , hammer toes and bunion       PHQ2/9: Depression screen [facility] 2/9 09/26/2020 03/30/2020 01/28/2020 12/07/2019 10/14/2019  Decreased Interest _0 Down, Depressed, Hopeless _1 PHQ - 2 Score _2 Altered sleeping 1 0 _3 Tired, decreased energy _4 Change in appetite 1 0 _5 Feeling bad or failure about yourself  0 0 0 1 2  Trouble concentrating 0 0  _6 Moving slowly or fidgety/restless 0 0 3 2 0  Suicidal thoughts 0 0 0 0 0  PHQ-9 Score _7 Difficult doing work/chores - - Very difficult Very difficult -  Some recent data might be hidden    phq 9 is positive  Fall Risk: Fall Risk  09/26/2020 03/30/2020 01/28/2020 12/07/2019 10/14/2019  Falls in the past year? 0 _8 Number falls in past yr: 0 _9 Comment - 3-4 - - -  Injury with Fall? 0 0 0 1 1  Comment - - - - -  Risk for fall due to : - - History of fall(s);Orthopedic patient History of fall(s) -  Risk for fall due to: Comment - - - - -  Follow up - - - - -     Functional Status Survey: Is the patient deaf or have difficulty hearing?: Yes Does the patient have difficulty seeing, even when wearing glasses/contacts?: Yes Does the patient have difficulty concentrating, remembering, or making decisions?: Yes Does the patient have difficulty walking or climbing stairs?: Yes Does the patient have difficulty dressing or bathing?: No Does the patient have difficulty doing errands alone such as visiting a doctor's office or shopping?: Yes    Assessment & Plan   1. Type 2 diabetes mellitus with peripheral neuropathy (HCC)  - Microalbumin / creatinine urine ratio - POCT HgB A1C - Continuous Blood Gluc Transmit (DEXCOM G6 TRANSMITTER) MISC; 1 each by Does not apply route every 3 (three) months.  Dispense: 1 each; Refill: EA - Continuous Blood Gluc Sensor (DEXCOM G6 SENSOR) MISC; 1 each by Does not apply route as directed. Every 10 days  Dispense: 3 each; Refill: 5  2. Dysuria  - POCT Urinalysis Dipstick - CULTURE, URINE COMPREHENSIVE - nitrofurantoin, macrocrystal-monohydrate, (MACROBID) 100 MG capsule; Take 1 capsule (100 mg total) by mouth 2 (two) times daily.  Dispense: 10 capsule; Refill: 0  3. Chronic diastolic heart failure ([place])   4. Essential (primary) hypertension  - COMPLETE METABOLIC PANEL WITH GFR - CBC with Differential/Platelet  5.  Centrilobular emphysema ([place])  Not ready to quit smoking   6. Dyslipidemia (high LDL; low HDL)  - Lipid panel - rosuvastatin (CRESTOR) 5 MG tablet; Take 1 tablet (5 mg total) by mouth at bedtime.  Dispense: 90 tablet; Refill: 1  7. Parkinson's disease ([place])  Under the care of Dr. Raul Del   8. Senile purpura ([place])  Reassurance given   9. Atherosclerosis of aorta ([place])   10. COPD with asthma ([place])   11. GAD (generalized anxiety disorder)   12. MDD (major depressive disorder), recurrent episode, moderate ([place])  13. Gastroesophageal reflux disease without esophagitis  Taking otc pepcid  14. CAFL (chronic airflow limitation) (HCC)   15. Chronic kidney disease (CKD) stage G3a/A2, moderately decreased glomerular filtration rate (GFR) between 45-59 mL/min/1.73 square meter and albuminuria creatinine ratio between 30-299 mg/g (HCC)  Recheck level today   16. Need for vaccination for pneumococcus  Up to date   73. Need for shingles vaccine  - Zoster Vaccine Adjuvanted [facility]) injection; Inject 0.5 mLs into the muscle once for 1 dose.  Dispense: 0.5 mL; Refill: 1  18. Breast cancer screening by mammogram  Refused   19. Cervical cancer screening  Refused   20. Controlled gout  - Uric acid  21. Vitamin D deficiency  - VITAMIN D 25 Hydroxy (Vit-D Deficiency, Fractures)  22. Diabetes mellitus type 2, insulin dependent (HCC)  - metFORMIN (GLUCOPHAGE-XR) 750 MG 24 hr tablet; Take 1 tablet (750 mg total) by mouth 2 (two) times daily.  Dispense: 180 tablet; Refill: 1  23. DM type 2 with diabetic dyslipidemia (HCC)  - rosuvastatin (CRESTOR) 5 MG tablet; Take 1 tablet (5 mg total) by mouth at bedtime.  Dispense: 90 tablet; Refill: 1    24. Mild protein-calorie malnutrition ([place])  Discussed labs but also importance of looking for cancer, she refused mammogram, pap smear, colon cancer screen up to date

## 2020-09-26 ENCOUNTER — Encounter: Payer: Self-pay | Admitting: Family Medicine

## 2020-09-26 ENCOUNTER — Other Ambulatory Visit: Payer: Self-pay

## 2020-09-26 ENCOUNTER — Ambulatory Visit
Admission: RE | Admit: 2020-09-26 | Discharge: 2020-09-26 | Disposition: A | Discharge status: Home / Self Care | Payer: Medicare Other | Source: Ambulatory Visit | Attending: Family Medicine | Admitting: Family Medicine

## 2020-09-26 ENCOUNTER — Ambulatory Visit (INDEPENDENT_AMBULATORY_CARE_PROVIDER_SITE_OTHER): Payer: Medicare Other | Admitting: Family Medicine

## 2020-09-26 VITALS — BP 128/70 | HR 98 | Temp 97.9°F | Resp 16 | Ht 69.0 in | Wt 159.0 lb

## 2020-09-26 DIAGNOSIS — Z23 Encounter for immunization: Secondary | ICD-10-CM

## 2020-09-26 DIAGNOSIS — E785 Hyperlipidemia, unspecified: Secondary | ICD-10-CM | POA: Diagnosis not present

## 2020-09-26 DIAGNOSIS — Z794 Long term (current) use of insulin: Secondary | ICD-10-CM

## 2020-09-26 DIAGNOSIS — E1142 Type 2 diabetes mellitus with diabetic polyneuropathy: Secondary | ICD-10-CM

## 2020-09-26 DIAGNOSIS — N1831 Chronic kidney disease, stage 3a: Secondary | ICD-10-CM

## 2020-09-26 DIAGNOSIS — Z124 Encounter for screening for malignant neoplasm of cervix: Secondary | ICD-10-CM

## 2020-09-26 DIAGNOSIS — R300 Dysuria: Secondary | ICD-10-CM | POA: Diagnosis not present

## 2020-09-26 DIAGNOSIS — I5032 Chronic diastolic (congestive) heart failure: Secondary | ICD-10-CM

## 2020-09-26 DIAGNOSIS — G20 Parkinson's disease: Secondary | ICD-10-CM | POA: Diagnosis not present

## 2020-09-26 DIAGNOSIS — J449 Chronic obstructive pulmonary disease, unspecified: Secondary | ICD-10-CM

## 2020-09-26 DIAGNOSIS — M109 Gout, unspecified: Secondary | ICD-10-CM | POA: Diagnosis not present

## 2020-09-26 DIAGNOSIS — I10 Essential (primary) hypertension: Secondary | ICD-10-CM

## 2020-09-26 DIAGNOSIS — F411 Generalized anxiety disorder: Secondary | ICD-10-CM

## 2020-09-26 DIAGNOSIS — I700 Atherosclerosis of aorta: Secondary | ICD-10-CM | POA: Diagnosis not present

## 2020-09-26 DIAGNOSIS — E441 Mild protein-calorie malnutrition: Secondary | ICD-10-CM | POA: Insufficient documentation

## 2020-09-26 DIAGNOSIS — E559 Vitamin D deficiency, unspecified: Secondary | ICD-10-CM | POA: Diagnosis not present

## 2020-09-26 DIAGNOSIS — E440 Moderate protein-calorie malnutrition: Secondary | ICD-10-CM | POA: Insufficient documentation

## 2020-09-26 DIAGNOSIS — E041 Nontoxic single thyroid nodule: Secondary | ICD-10-CM | POA: Diagnosis not present

## 2020-09-26 DIAGNOSIS — E1169 Type 2 diabetes mellitus with other specified complication: Secondary | ICD-10-CM

## 2020-09-26 DIAGNOSIS — E119 Type 2 diabetes mellitus without complications: Secondary | ICD-10-CM

## 2020-09-26 DIAGNOSIS — Z1231 Encounter for screening mammogram for malignant neoplasm of breast: Secondary | ICD-10-CM

## 2020-09-26 DIAGNOSIS — K219 Gastro-esophageal reflux disease without esophagitis: Secondary | ICD-10-CM

## 2020-09-26 DIAGNOSIS — D692 Other nonthrombocytopenic purpura: Secondary | ICD-10-CM | POA: Diagnosis not present

## 2020-09-26 DIAGNOSIS — F331 Major depressive disorder, recurrent, moderate: Secondary | ICD-10-CM

## 2020-09-26 DIAGNOSIS — R590 Localized enlarged lymph nodes: Secondary | ICD-10-CM | POA: Diagnosis not present

## 2020-09-26 DIAGNOSIS — J432 Centrilobular emphysema: Secondary | ICD-10-CM

## 2020-09-26 LAB — POCT GLYCOSYLATED HEMOGLOBIN (HGB A1C): Hemoglobin A1C: 7 % — AB (ref 4.0–5.6)

## 2020-09-26 LAB — POCT URINALYSIS DIPSTICK
Bilirubin, UA: NEGATIVE
Glucose, UA: NEGATIVE
Ketones, UA: NEGATIVE
Nitrite, UA: NEGATIVE
Protein, UA: POSITIVE — AB
Spec Grav, UA: 1.025 (ref 1.010–1.025)
Urobilinogen, UA: 0.2 E.U./dL
pH, UA: 7 (ref 5.0–8.0)

## 2020-09-26 MED ORDER — SHINGRIX 50 MCG/0.5ML IM SUSR: 0.5000 mL | Freq: Once | INTRAMUSCULAR | 1 refills | Status: AC

## 2020-09-26 MED ORDER — NITROFURANTOIN MONOHYD MACRO 100 MG PO CAPS: 100.0000 mg | ORAL_CAPSULE | Freq: Two times a day (BID) | ORAL | 0 refills | Status: DC

## 2020-09-26 MED ORDER — METFORMIN HCL ER 750 MG PO TB24: 750.0000 mg | ORAL_TABLET | Freq: Two times a day (BID) | ORAL | 1 refills | Status: DC

## 2020-09-26 MED ORDER — SYNJARDY 12.5-500 MG PO TABS: 1.0000 | ORAL_TABLET | Freq: Two times a day (BID) | ORAL | 0 refills | Status: DC

## 2020-09-26 MED ORDER — DEXCOM G6 SENSOR MISC: 1.0000 | 5 refills | Status: DC

## 2020-09-26 MED ORDER — ROSUVASTATIN CALCIUM 5 MG PO TABS: 5.0000 mg | ORAL_TABLET | Freq: Every day | ORAL | 1 refills | Status: DC

## 2020-09-26 MED ORDER — COLCHICINE 0.6 MG PO TABS: 0.6000 mg | ORAL_TABLET | Freq: Every day | ORAL | 0 refills | Status: DC

## 2020-09-26 MED ORDER — DEXCOM G6 TRANSMITTER MISC: 1.0000 | Status: DC

## 2020-09-27 LAB — CBC WITH DIFFERENTIAL/PLATELET
Absolute Monocytes: 592 cells/uL (ref 200–950)
Basophils Absolute: 20 cells/uL (ref 0–200)
Basophils Relative: 0.3 %
Eosinophils Absolute: 182 cells/uL (ref 15–500)
Eosinophils Relative: 2.8 %
HCT: 45.4 % — ABNORMAL HIGH (ref 35.0–45.0)
Hemoglobin: 14.6 g/dL (ref 11.7–15.5)
Lymphs Abs: 2503 cells/uL (ref 850–3900)
MCH: 31 pg (ref 27.0–33.0)
MCHC: 32.2 g/dL (ref 32.0–36.0)
MCV: 96.4 fL (ref 80.0–100.0)
MPV: 9.5 fL (ref 7.5–12.5)
Monocytes Relative: 9.1 %
Neutro Abs: 3205 cells/uL (ref 1500–7800)
Neutrophils Relative %: 49.3 %
Platelets: 282 10*3/uL (ref 140–400)
RBC: 4.71 10*6/uL (ref 3.80–5.10)
RDW: 11.8 % (ref 11.0–15.0)
Total Lymphocyte: 38.5 %
WBC: 6.5 10*3/uL (ref 3.8–10.8)

## 2020-09-27 LAB — COMPLETE METABOLIC PANEL WITH GFR
AG Ratio: 1.3 (calc) (ref 1.0–2.5)
ALT: 25 U/L (ref 6–29)
AST: 22 U/L (ref 10–35)
Albumin: 3.6 g/dL (ref 3.6–5.1)
Alkaline phosphatase (APISO): 107 U/L (ref 37–153)
BUN: 14 mg/dL (ref 7–25)
CO2: 30 mmol/L (ref 20–32)
Calcium: 9 mg/dL (ref 8.6–10.4)
Chloride: 103 mmol/L (ref 98–110)
Creat: 0.72 mg/dL (ref 0.50–1.05)
Globulin: 2.8 g/dL (calc) (ref 1.9–3.7)
Glucose, Bld: 155 mg/dL — ABNORMAL HIGH (ref 65–99)
Potassium: 4.3 mmol/L (ref 3.5–5.3)
Sodium: 140 mmol/L (ref 135–146)
Total Bilirubin: 0.5 mg/dL (ref 0.2–1.2)
Total Protein: 6.4 g/dL (ref 6.1–8.1)
eGFR: 96 mL/min/{1.73_m2} (ref >=60)

## 2020-09-27 LAB — MICROALBUMIN / CREATININE URINE RATIO
Creatinine, Urine: 94 mg/dL (ref 20–275)
Microalb Creat Ratio: 489 mcg/mg creat — ABNORMAL HIGH (ref <30)
Microalb, Ur: 46 mg/dL

## 2020-09-27 LAB — LIPID PANEL
Cholesterol: 85 mg/dL (ref <200)
HDL: 31 mg/dL — ABNORMAL LOW (ref >=50)
LDL Cholesterol (Calc): 37 mg/dL (calc)
Non-HDL Cholesterol (Calc): 54 mg/dL (calc) (ref <130)
Total CHOL/HDL Ratio: 2.7 (calc) (ref <5.0)
Triglycerides: 88 mg/dL (ref <150)

## 2020-09-27 LAB — VITAMIN D 25 HYDROXY (VIT D DEFICIENCY, FRACTURES): Vit D, 25-Hydroxy: 21 ng/mL — ABNORMAL LOW (ref 30–100)

## 2020-09-27 LAB — URIC ACID: Uric Acid, Serum: 5.2 mg/dL (ref 2.5–7.0)

## 2020-09-28 LAB — CULTURE, URINE COMPREHENSIVE
MICRO NUMBER:: 12215481
SPECIMEN QUALITY:: ADEQUATE

## 2020-10-05 ENCOUNTER — Other Ambulatory Visit: Payer: Self-pay | Admitting: Family Medicine

## 2020-10-05 ENCOUNTER — Encounter: Payer: Self-pay | Admitting: Family Medicine

## 2020-10-13 ENCOUNTER — Encounter: Payer: Self-pay | Admitting: Family Medicine

## 2020-10-13 DIAGNOSIS — M542 Cervicalgia: Secondary | ICD-10-CM | POA: Diagnosis not present

## 2020-10-13 DIAGNOSIS — Z79891 Long term (current) use of opiate analgesic: Secondary | ICD-10-CM | POA: Diagnosis not present

## 2020-10-13 DIAGNOSIS — M5416 Radiculopathy, lumbar region: Secondary | ICD-10-CM | POA: Diagnosis not present

## 2020-10-13 DIAGNOSIS — K5903 Drug induced constipation: Secondary | ICD-10-CM | POA: Diagnosis not present

## 2020-10-13 DIAGNOSIS — R519 Headache, unspecified: Secondary | ICD-10-CM | POA: Diagnosis not present

## 2020-10-13 DIAGNOSIS — M25569 Pain in unspecified knee: Secondary | ICD-10-CM | POA: Diagnosis not present

## 2020-10-13 DIAGNOSIS — G894 Chronic pain syndrome: Secondary | ICD-10-CM | POA: Diagnosis not present

## 2020-10-26 ENCOUNTER — Other Ambulatory Visit: Payer: Self-pay | Admitting: Family Medicine

## 2020-10-26 ENCOUNTER — Telehealth: Payer: Self-pay

## 2020-10-26 NOTE — Progress Notes (Signed)
    Chronic Care Management Pharmacy Assistant   Name: Tricia [name]  MRN: [id number] DOB: 06-06-1960  Patient called to be reminded of her appointment with Junius Argyle, CPP on 10/27/2020 '@1300'$  via telephone.   No answer, left message of appointment date, time and type of appointment (either telephone or in person). Left message to have all medications, supplements, blood pressure and/or blood sugar logs available during appointment and to return call if need to reschedule.  Star Rating Drug: Synjardy 12.5-500 mg 09/26/2020 for a 90-Day supply with [facility] Rosuvastatin 5 mg last filled on 09/26/2020 for a 90-Day supply with [facility]  Any gaps in medications fill history? No  Care Gaps: Mammogram Zoster Vaccines PAP Smear PNA (last completed 02/19/2009) COVID-19 Vaccine Booster 3 Ophthalmology Exam (last completed 02/27/2019) Influenza Vaccine (last completed 10/14/2019)  Lynann Bologna, CPA/CMA Clinical Pharmacist Assistant Phone: [phone]

## 2020-10-27 ENCOUNTER — Ambulatory Visit (INDEPENDENT_AMBULATORY_CARE_PROVIDER_SITE_OTHER): Payer: Medicare Other

## 2020-10-27 DIAGNOSIS — E1142 Type 2 diabetes mellitus with diabetic polyneuropathy: Secondary | ICD-10-CM

## 2020-10-27 DIAGNOSIS — J441 Chronic obstructive pulmonary disease with (acute) exacerbation: Secondary | ICD-10-CM

## 2020-10-27 NOTE — Progress Notes (Signed)
Chronic Care Management Pharmacy Note  10/27/2020 Name:  Tricia [name] MRN:  [id number] DOB:  01/27/1961  Summary: Patient presents for CCM follow-up. She is in progress of trying to get a Dexcom G6 sensor. She is also worried about worsening COPD symptoms. She is having more frequent coughing and sputum production.   Recommendations/Changes made from today's visit: STOP Humalog (Patient not using)  Acute visit with provider to assess current COPD symptoms and determine if steroid burst is necessary.   Plan: CPP follow-up 3 months    Subjective: Tricia [name] is an 60 y.o. year old female who is a primary patient of [name], MD.  The CCM team was consulted for assistance with disease management and care coordination needs.    Engaged with patient by telephone for follow up visit in response to provider referral for pharmacy case management and/or care coordination services.   Consent to Services:  The patient was given information about Chronic Care Management services, agreed to services, and gave verbal consent prior to initiation of services.  Please see initial visit note for detailed documentation.   Patient Care Team: [name], MD as PCP - General (Family Medicine) Erby Pian, MD as Consulting Physician (Pulmonary Disease) [name], MD as Consulting Physician (Neurology) Ubaldo Glassing Javier Docker, MD as Consulting Physician (Cardiology) Lonia Farber, MD as Consulting Physician (Internal Medicine) Germaine Pomfret, [facility] (Pharmacist) Ursula Alert, MD as Consulting Physician (Psychiatry)  Recent office visits: 09/26/20: Patient presented to Dr. [name] for follow-up. Metformin stopped, Synjardy started.   Recent consult visits: 07/05/20: Patient presented to Dr. Raul Del (Pulmonology) for follow-up.   Hospital visits: None in previous 6 months   Objective:  Lab Results  Component Value Date   CREATININE 0.72 09/26/2020    BUN 14 09/26/2020   GFRNONAA 76 08/05/2019   GFRAA 88 08/05/2019   NA 140 09/26/2020   K 4.3 09/26/2020   CALCIUM 9.0 09/26/2020   CO2 30 09/26/2020   GLUCOSE 155 (H) 09/26/2020    Lab Results  Component Value Date/Time   HGBA1C 7.0 (A) 09/26/2020 03:07 PM   HGBA1C 6.9 (A) 12/07/2019 02:18 PM   HGBA1C 6.8 (H) 08/05/2019 03:04 PM   HGBA1C 6.7 (H) 08/27/2018 02:01 PM   HGBA1C 13.7 (A) 03/19/2018 02:04 PM   HGBA1C 14.0 (A) 10/29/2017 02:18 PM   HGBA1C 7.4 (H) 10/09/2012 04:52 AM   HGBA1C 7.1 (H) 07/02/2012 05:33 AM   MICROALBUR 46.0 09/26/2020 04:05 PM   MICROALBUR 1.2 08/05/2019 03:04 PM   MICROALBUR 50 03/19/2018 02:07 PM   MICROALBUR 100 01/24/2015 12:22 PM    Last diabetic Eye exam:  Lab Results  Component Value Date/Time   HMDIABEYEEXA No Retinopathy 02/27/2019 12:00 AM    Last diabetic Foot exam: No results found for: HMDIABFOOTEX   Lab Results  Component Value Date   CHOL 85 09/26/2020   HDL 31 (L) 09/26/2020   LDLCALC 37 09/26/2020   TRIG 88 09/26/2020   CHOLHDL 2.7 09/26/2020    Hepatic Function Latest Ref Rng & Units 09/26/2020 08/05/2019 08/27/2018  Total Protein 6.1 - 8.1 g/dL 6.4 6.5 7.0  Albumin 3.5 - 5.0 g/dL - - 4.1  AST 10 - 35 U/L 22 7(L) 18  ALT 6 - 29 U/L _0 Alk Phosphatase 38 - 126 U/L - - 77  Total Bilirubin 0.2 - 1.2 mg/dL 0.5 0.3 0.3    Lab Results  Component Value Date/Time   TSH 0.31 (L)  08/05/2019 03:04 PM   TSH 1.380 10/04/2014 04:20 PM    CBC Latest Ref Rng & Units 09/26/2020 08/05/2019 08/27/2018  WBC 3.8 - 10.8 Thousand/uL 6.5 8.3 6.3  Hemoglobin 11.7 - 15.5 g/dL 14.6 14.2 14.1  Hematocrit 35.0 - 45.0 % 45.4(H) 43.1 42.2  Platelets 140 - 400 Thousand/uL 282 206 235    Lab Results  Component Value Date/Time   VD25OH 21 (L) 09/26/2020 04:05 PM   VD25OH 17 (L) 08/05/2019 03:04 PM    Clinical ASCVD: No  The ASCVD Risk score (Arnett DK, et al., 2019) failed to calculate for the following reasons:   The valid total cholesterol  range is 130 to 320 mg/dL    Depression screen [facility] 2/9 09/26/2020 03/30/2020 01/28/2020  Decreased Interest _0 Down, Depressed, Hopeless _1 PHQ - 2 Score _2 Altered sleeping 1 0 3  Tired, decreased energy _3 Change in appetite 1 0 1  Feeling bad or failure about yourself  0 0 0  Trouble concentrating 0 0 3  Moving slowly or fidgety/restless 0 0 3  Suicidal thoughts 0 0 0  PHQ-9 Score _4 Difficult doing work/chores - - Very difficult  Some recent data might be hidden    Social History   Tobacco Use  Smoking Status Every Day   Packs/day: 1.00   Years: 41.00   Pack years: 41.00   Types: Cigarettes   Start date: 05/19/1977  Smokeless Tobacco Never   BP Readings from Last 3 Encounters:  09/26/20 128/70  02/19/20 116/73  01/28/20 118/72   Pulse Readings from Last 3 Encounters:  09/26/20 98  02/19/20 100  01/28/20 99   Wt Readings from Last 3 Encounters:  09/26/20 159 lb (72.1 kg)  03/25/20 170 lb (77.1 kg)  01/28/20 174 lb 1.6 oz (79 kg)   BMI Readings from Last 3 Encounters:  09/26/20 23.48 kg/m  03/25/20 25.10 kg/m  01/28/20 25.71 kg/m    Assessment/Interventions: Review of patient past medical history, allergies, medications, health status, including review of consultants reports, laboratory and other test data, was performed as part of comprehensive evaluation and provision of chronic care management services.   SDOH:  (Social Determinants of Health) assessments and interventions performed: Yes SDOH Interventions    Flowsheet Row Most Recent Value  SDOH Interventions   Financial Strain Interventions Intervention Not Indicated      SDOH Screenings   Alcohol Screen: Low Risk    Last Alcohol Screening Score (AUDIT): 0  Depression (PHQ2-9): Medium Risk   PHQ-2 Score: 9  Financial Resource Strain: Low Risk    Difficulty of Paying Living Expenses: Not hard at all  Food Insecurity: No Food Insecurity   Worried About Charity fundraiser in  the Last Year: Never true   Ran Out of Food in the Last Year: Never true  Housing: Low Risk    Last Housing Risk Score: 0  Physical Activity: Inactive   Days of Exercise per Week: 0 days   Minutes of Exercise per Session: 0 min  Social Connections: Moderately Isolated   Frequency of Communication with Friends and Family: More than three times a week   Frequency of Social Gatherings with Friends and Family: Once a week   Attends Religious Services: Never   Marine scientist or Organizations: No   Attends Archivist Meetings: Never   Marital Status: Living with partner  Stress: Stress Concern  Present   Feeling of Stress : Rather much  Tobacco Use: High Risk   Smoking Tobacco Use: Every Day   Smokeless Tobacco Use: Never  Transportation Needs: No Transportation Needs   Lack of Transportation (Medical): No   Lack of Transportation (Non-Medical): No    CCM Care Plan  Allergies  Allergen Reactions   Augmentin [Amoxicillin-Pot Clavulanate] Diarrhea   Penicillins Itching    Medications Reviewed Today     Reviewed by Alba Cory, MD (Physician) on 09/26/20 at 1539  Med List Status: <None>   Medication Order Taking? Sig Documenting Provider Last Dose Status Informant  blood glucose meter kit and supplies KIT [id number] Yes Dispense accuchek aviva plus; can change if needed e11.9 Cheryle Horsfall, NP Taking Active Self  budesonide-formoterol (SYMBICORT) 160-4.5 MCG/ACT inhaler [id number] Yes Inhale 2 puffs into the lungs in the morning and at bedtime. Mertie Moores, MD Taking Active            Med Note Lauretta Chester, Coy Saunas A   Fri Mar 04, 2020 10:56 AM) Prescribed by Dr. Mayo Ao  carbidopa-levodopa (SINEMET IR) 25-100 MG tablet [id number] Yes Take 1.5 tablets by mouth 3 (three) times daily. Lonell Face, MD Taking Active            Med Note Lauretta Chester, Micael Hampshire   Fri Mar 04, 2020 10:54 AM) Prescribed by Dr. Sherryll Burger  cetirizine (ZYRTEC) 10 MG tablet [id number] Yes  Take 10 mg by mouth daily as needed for allergies. [provider] Taking Active Self  colchicine (COLCRYS) 0.6 MG tablet [id number] Yes Take 1 tablet (0.6 mg total) by mouth daily. Alba Cory, MD Taking Active            Med Note Lauretta Chester, Coy Saunas A   Wed Mar 02, 2020  2:51 PM) Only takes PRN  cyclobenzaprine (FLEXERIL) 10 MG tablet [id number] Yes Take 10 mg by mouth 3 (three) times daily.  [provider] Taking Active            Med Note Lauretta Chester, Micael Hampshire   Fri Mar 04, 2020 10:57 AM) Prescribed by Asencion Gowda, PA-C  DULoxetine (CYMBALTA) 30 MG capsule [id number] Yes TAKE 1 CAPSULE BY MOUTH ONCE DAILY. *NEEDS APPOINTMENT FOR FURTHER FILLS* Darcel Smalling, MD Taking Active   furosemide (LASIX) 40 MG tablet [id number] Yes Take 1 tablet (40 mg total) by mouth daily as needed. Cheryle Horsfall, NP Taking Active Self           Med Note Lauretta Chester, Coy Saunas A   Fri Mar 04, 2020 10:38 AM)    gabapentin (NEURONTIN) 600 MG tablet [id number] Yes Take 1 tablet (600 mg total) by mouth 3 (three) times daily. Cheryle Horsfall, NP Taking Active Self           Med Note Lauretta Chester, Micael Hampshire   Fri Mar 04, 2020 10:54 AM) Prescribed by Dr. Sherryll Burger  glucose blood test strip [id number] Yes Use as directed to check blood glucose daily Alba Cory, MD Taking Active   Discontinued 09/26/20 1536 (Completed Course)   HUMALOG KWIKPEN 100 UNIT/ML KwikPen [id number] Yes INJECT SUBCUTANEOUSLY 5 TO  10 UNITS 3 TIMES DAILY Sowles, Danna Hefty, MD Taking Active   hydrOXYzine (VISTARIL) 50 MG capsule [id number] Yes TAKE 1 CAPSULE BY MOUTH TWICE DAILY AS NEEDED FOR SEVERE ANXIETY SYMPTOMS Eappen, Saramma, MD Taking Active   insulin glargine (LANTUS SOLOSTAR) 100 UNIT/ML Solostar Pen [id number] Yes Inject 20 Units into the skin daily. Alba Cory, MD Taking Active  Med Note Michaelle Birks, Jolicia Delira A   Thu Mar 24, 2020 10:29 AM) Only taking PRN in the evenings if her sugars feel elevated  Insulin  Pen Needle 32G X 6 MM MISC [id number] Yes 2 each by Does not apply route daily. [name], MD Taking Active   ipratropium-albuterol (DUONEB) 0.5-2.5 (3) MG/3ML SOLN [id number] Yes Inhale 3 mLs into the lungs every 6 (six) hours as needed. [name], NP Taking Active Self  Discontinued 09/26/20 1537 (Completed Course)   lidocaine (LIDODERM) 5 % [id number] Yes 1 patch every 12 (twelve) hours as needed. [provider] Taking Active            Med Note Michaelle Birks, Glean Salvo   Fri Mar 04, 2020 10:58 AM) Prescribed by Farris Has, PA-C  lubiprostone (AMITIZA) 24 MCG capsule [id number] Yes Take by mouth 2 (two) times daily as needed. [provider] Taking Active            Med Note Michaelle Birks, Glean Salvo   Fri Mar 04, 2020 10:58 AM) Prescribed by Farris Has, PA-C  metFORMIN (GLUCOPHAGE-XR) 750 MG 24 hr tablet [id number] Yes Take 2 tablets (1,500 mg total) by mouth daily with breakfast.  Patient taking differently: Take 750 mg by mouth daily with breakfast.   [name], MD Taking Active   montelukast (SINGULAIR) 10 MG tablet [id number] Yes TAKE 1 TABLET BY MOUTH  DAILY IN THE [place] Police, MD Taking Active   morphine (MS CONTIN) 30 MG 12 hr tablet [id number] Yes Take 30 mg by mouth every 12 (twelve) hours. [provider] Taking Active            Med Note Michaelle Birks, Glean Salvo   Fri Mar 04, 2020 10:58 AM) Prescribed by Farris Has, PA-C  Discontinued 09/26/20 1538 (Completed Course)   Discontinued 09/26/20 1539 (Patient Discharge)   Oxycodone HCl 10 MG TABS [id number] Yes Take 10 mg by mouth 4 (four) times daily as needed.  [provider] Taking Active            Med Note Michaelle Birks, Glean Salvo   Fri Mar 04, 2020 10:58 AM) Prescribed by Farris Has, PA-C  promethazine (PHENERGAN) 25 MG tablet [id number] Yes Take 25 mg by mouth daily as needed. [provider] Taking Active            Med Note Michaelle Birks, Glean Salvo   Fri Mar 04, 2020 10:59 AM) Prescribed by Dr. Manuella Ghazi  rosuvastatin (CRESTOR) 5 MG tablet [id number] Yes Take 1 tablet (5 mg total) by mouth at bedtime. [name], MD Taking Active   theophylline (UNIPHYL) 400 MG 24 hr tablet [id number] Yes Take 1 tablet by mouth daily.  Erby Pian, MD Taking Active Self           Med Note Michaelle Birks, Glean Salvo   Fri Mar 04, 2020 10:59 AM) Prescribed by Dr. Vella Kohler  tiotropium [facility]) 18 MCG inhalation capsule [id number] Yes Place 1 capsule into inhaler and inhale daily. pm Erby Pian, MD Taking Active Self           Med Note Michaelle Birks, Glean Salvo   Fri Mar 04, 2020 11:00 AM) Prescribed by Dr. Vella Kohler  traZODone (DESYREL) 100 MG tablet [id number] Yes Take 1 tablet (100 mg total) by mouth at bedtime. Ursula Alert, MD Taking Active   Patient not taking:  Discontinued 09/26/20 1539 (Dose change)   Ubrogepant (UBRELVY) 100 MG TABS [id number] Yes Take 100 mg by mouth daily as needed.  May repeat in 2 hours [name], MD Taking Active            Med Note Michaelle Birks, Glean Salvo   Fri Mar 04, 2020 11:01 AM) Prescribed by Dr. Ronnell Guadalajara HFA [id number] [phone] Base) MCG/ACT inhaler [id number] Yes INHALE 1 PUFF BY MOUTH AS NEEDED [name], MD Taking Active             Patient Active Problem List   Diagnosis Date Noted   Mild protein-calorie malnutrition ([place]) 09/26/2020   Centrilobular emphysema ([place]) 03/30/2020   History of prolonged Q-T interval on ECG 11/18/2019   GAD (generalized anxiety disorder) 10/13/2019   MDD (major depressive disorder), recurrent episode, moderate ([place]) 10/13/2019   At risk for long QT syndrome 10/13/2019   Nonrheumatic mitral valve regurgitation 05/04/2019   Benign neoplasm of descending colon    Polyp of sigmoid colon    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   Polyneuropathy 10/21/2014   Chronic venous insufficiency 10/05/2014   Bilateral leg edema 08/16/2014   Major depression in partial remission ([place]) 08/16/2014    Acid reflux 08/16/2014   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   Asthma, moderate persistent 08/16/2014   Carpal tunnel syndrome 08/16/2014   Cervical pain 08/16/2014   CAFL (chronic airflow limitation) ([place]) 08/16/2014   Type 2 diabetes mellitus with peripheral neuropathy ([place]) 08/16/2014   Diabetes mellitus type 2, insulin dependent ([place]) 08/16/2014   Dyslipidemia 08/16/2014   Tobacco use disorder 08/16/2014   Essential (primary) hypertension 08/16/2014   Benign neoplasm of stomach 08/16/2014   Gout 08/16/2014   HLD (hyperlipidemia) 08/16/2014   Low back pain 08/16/2014   Lumbar radiculopathy 08/16/2014   Headache, migraine 08/16/2014   Arthralgia of multiple joints 08/16/2014   Avitaminosis D 08/16/2014   Primary osteoarthritis of both knees 06/22/2014   Benign essential tremor 10/02/2013   Cervical dystonia 10/02/2013   Chronic diastolic heart failure ([place]) 11/16/2012   Chronic pain 11/13/2012    Immunization History  Administered Date(s) Administered   Influenza,inj,Quad PF,6+ Mos 01/24/2015, 12/19/2016, 10/29/2017, 10/14/2019   Influenza-Unspecified 12/09/2015   PFIZER(Purple Top)SARS-COV-2 Vaccination 05/15/2019, 06/09/2019   Pneumococcal Polysaccharide-23 02/19/2009   Tdap 12/09/2012    Conditions to be addressed/monitored:  Hypertension, Hyperlipidemia, Diabetes, Heart Failure, GERD, COPD, Asthma, Depression, Anxiety, Osteoarthritis, Tobacco use, and Chronic Pain and Parkinson's Disease  Care Plan : General Pharmacy (Adult)  Updates made by Germaine Pomfret, RPH since 10/27/2020 12:00 AM     Problem: Hypertension, Hyperlipidemia, Diabetes, Heart Failure, GERD, COPD, Asthma, Depression, Anxiety, Osteoarthritis, Tobacco use, and Chronic Pain and Parkinson's Disease   Priority: High     Long-Range Goal: Patient-Specific Goal   Start Date: 10/27/2020  Expected End Date: 10/27/2021  This Visit's Progress: On track  Priority: High  Note:   Current Barriers:   No barriers noted  Pharmacist Clinical Goal(s):  Patient will maintain control of COPD as evidenced by stable breathing, lack of exacerbations  through collaboration with PharmD and provider.   Interventions: 1:1 collaboration with [name], MD regarding development and update of comprehensive plan of care as evidenced by provider attestation and co-signature Inter-disciplinary care team collaboration (see longitudinal plan of care) Comprehensive medication review performed; medication list updated in electronic medical record  Heart Failure (Goal: manage symptoms and prevent exacerbations) -Controlled -Last ejection fraction: 60-65% (Date: 2016) -HF type: Diastolic -NYHA Class: I (no actitivty limitation) -AHA HF Stage: B (Heart disease present - no symptoms present) -Current treatment: Furosemide 40 mg daily as  needed -Medications previously tried: NA  -Current home BP/HR readings: NA -Recommended to continue current medication  Hyperlipidemia: (LDL goal < 70) -Controlled -Current treatment: Rosuvastatin 5 mg daily at bedtime -Medications previously tried: NA  -Recommended to continue current medication  Diabetes (A1c goal <8%) -Controlled -Current medications: Synjardy 12.5-500 mg twice daily  Lantus 20 units daily (takes sporadically) -Medications previously tried: NA  -Patient never uses Humalog, only takes Lantus PRN when she feels her blood sugars are too elevated. She is interested in a CGM, but likely would not be covered under medicare.  -Current home glucose readings fasting glucose: Has not been monitoring.  -Denies hypoglycemic/hyperglycemic symptoms -Current meal patterns:  drinks: Increased water intake, 2-3 water glasses. Significantly cut back coffee intake, only 1-2 cups daily -Current exercise: Minimal -Recommended to continue current medication  Asthma/ COPD (Goal: control symptoms and prevent exacerbations) -Not ideally controlled -Current  treatment  Symbicort 2 puffs twice daily  DuoNeb  Montelukast 10 mg nightly  Theophylline 400 mg daily  Spiriva 18 mcg daily  Ventolin HFA 1 puff every 8 hours as needed  -Medications previously tried: NA  -Gold Grade: Gold 2 (FEV1 50-79%) -Current COPD Classification:  B (high sx, <2 exacerbations/yr) -CAT ASSESSMENT  Rank each of the following items on a scale of 0 to 5 (with 5 being most severe) Write a # 0-5 in each box  I never cough (0) > I cough all the time (5) 4  I have no phlegm (mucus) in my chest (0) > My chest is completely full of phlegm (mucus) (5) 3  My chest does not feel tight at all (0) > My chest feels very tight (5) 0  When I walk up a hill or one flight of stairs I am not breathless (0) > When I walk up a hill or one flight of stairs I am very breathless (5) 3  I am not limited doing any activities at home (0) > I am very limited doing activities at home (5) 2  I am confident leaving my home despite my lung function (0) > I am not at all confident leaving my home because of my lung condition (5)  4  I sleep soundly (0) > I don't sleep soundly because of my lung condition (5) 3  I have lots of energy (0) > I have no energy at all (5) 2  Total CAT Score: 21 (high)  -Pulmonary function testing: FEV1 58% (2019) -Exacerbations requiring treatment in last 6 months: None -Patient reports consistent use of maintenance inhaler -Frequency of rescue inhaler use: 1-2 times daily  -Will set up patient for OV with provider for pulmonary assessment due to concern of possible exacerbation  -Recommended to continue current medication  Depression/Anxiety (Goal: Achieve symptom remission) -Controlled -Current treatment: Duloxetine 30 mg daily  Hydroxyzine 50 mg twice daily as needed  Trazodone 100 mg nightly -Medications previously tried/failed: NA -PHQ9: 9 -GAD7: NA -Recommended to continue current medication  Chronic Pain (Goal: Achieve adequate pain  control) -Controlled -Current treatment  Morphine MS 30 mg twice daily  Oxycodone 10 mg four times daily as needed  -Medications previously tried: NA  -Recommended to continue current medication   Patient Goals/Self-Care Activities Patient will:  - check glucose twice daily, before breakfast and at bedtime, document, and provide at future appointments check blood pressure weekly, document, and provide at future appointments  Follow Up Plan: Telephone follow up appointment with care management team member scheduled for:  02/15/2021 at 3:00  PM      Medication Assistance: None required.  Patient affirms current coverage meets needs.  Compliance/Adherence/Medication fill history: Care Gaps: Mammogram PAP Smear Pneumonia  Covid Vaccine Ophthalmology Exam Influenza Vaccine  Star-Rating Drugs: Synjardy LF 09/26/20 for 90-DS  Rosuvastatin 5 mg LF 09/26/20 for 90-DS   Patient's preferred pharmacy is:  [facility], [place] [address] Phone: [phone] Fax: [phone]  [facility]  ([place], [place] [facility] [address] [place] [address] [place] [address] Phone: [phone] Fax: [phone]  Uses pill box? Yes Pt endorses 100% compliance  We discussed: Current pharmacy is preferred with insurance plan and patient is satisfied with pharmacy services Patient decided to: Continue current medication management strategy  Care Plan and Follow Up Patient Decision:  Patient agrees to Care Plan and Follow-up.  Plan: Telephone follow up appointment with care management team member scheduled for:  02/15/2021 at 3:00 PM  Malva Limes, [facility] [phone]

## 2020-10-27 NOTE — Patient Instructions (Signed)
Visit Information It was great speaking with you today!  Please let me know if you have any questions about our visit.   Goals Addressed             This Visit's Progress    Track and Manage My Symptoms-COPD       Timeframe:  Long-Range Goal Priority:  High Start Date: 10/27/2020                             Expected End Date: 10/27/2021                       Follow Up Date 12/18/2020    - develop a rescue plan - eliminate symptom triggers at home - follow rescue plan if symptoms flare-up - keep follow-up appointments    Why is this important?   Tracking your symptoms and other information about your health helps your doctor plan your care.  Write down the symptoms, the time of day, what you were doing and what medicine you are taking.  You will soon learn how to manage your symptoms.     Notes:         Patient Care Plan: General Pharmacy (Adult)     Problem Identified: Hypertension, Hyperlipidemia, Diabetes, Heart Failure, GERD, COPD, Asthma, Depression, Anxiety, Osteoarthritis, Tobacco use, and Chronic Pain and Parkinson's Disease   Priority: High     Long-Range Goal: Patient-Specific Goal   Start Date: 10/27/2020  Expected End Date: 10/27/2021  This Visit's Progress: On track  Priority: High  Note:   Current Barriers:  No barriers noted  Pharmacist Clinical Goal(s):  Patient will maintain control of COPD as evidenced by stable breathing, lack of exacerbations  through collaboration with PharmD and provider.   Interventions: 1:1 collaboration with [name], MD regarding development and update of comprehensive plan of care as evidenced by provider attestation and co-signature Inter-disciplinary care team collaboration (see longitudinal plan of care) Comprehensive medication review performed; medication list updated in electronic medical record  Heart Failure (Goal: manage symptoms and prevent exacerbations) -Controlled -Last ejection fraction: 60-65% (Date:  2016) -HF type: Diastolic -NYHA Class: I (no actitivty limitation) -AHA HF Stage: B (Heart disease present - no symptoms present) -Current treatment: Furosemide 40 mg daily as needed -Medications previously tried: NA  -Current home BP/HR readings: NA -Recommended to continue current medication  Hyperlipidemia: (LDL goal < 70) -Controlled -Current treatment: Rosuvastatin 5 mg daily at bedtime -Medications previously tried: NA  -Recommended to continue current medication  Diabetes (A1c goal <8%) -Controlled -Current medications: Synjardy 12.5-500 mg twice daily  Lantus 20 units daily (takes sporadically) -Medications previously tried: NA  -Patient never uses Humalog, only takes Lantus PRN when she feels her blood sugars are too elevated. She is interested in a CGM, but likely would not be covered under medicare.  -Current home glucose readings fasting glucose: Has not been monitoring.  -Denies hypoglycemic/hyperglycemic symptoms -Current meal patterns:  drinks: Increased water intake, 2-3 water glasses. Significantly cut back coffee intake, only 1-2 cups daily -Current exercise: Minimal -Recommended to continue current medication  Asthma/ COPD (Goal: control symptoms and prevent exacerbations) -Not ideally controlled -Current treatment  Symbicort 2 puffs twice daily  DuoNeb  Montelukast 10 mg nightly  Theophylline 400 mg daily  Spiriva 18 mcg daily  Ventolin HFA 1 puff every 8 hours as needed  -Medications previously tried: NA  -Gold Grade: Gold 2 (FEV1 50-79%) -  Current COPD Classification:  B (high sx, <2 exacerbations/yr) -CAT ASSESSMENT  Rank each of the following items on a scale of 0 to 5 (with 5 being most severe) Write a # 0-5 in each box  I never cough (0) > I cough all the time (5) 4  I have no phlegm (mucus) in my chest (0) > My chest is completely full of phlegm (mucus) (5) 3  My chest does not feel tight at all (0) > My chest feels very tight (5) 0  When I  walk up a hill or one flight of stairs I am not breathless (0) > When I walk up a hill or one flight of stairs I am very breathless (5) 3  I am not limited doing any activities at home (0) > I am very limited doing activities at home (5) 2  I am confident leaving my home despite my lung function (0) > I am not at all confident leaving my home because of my lung condition (5)  4  I sleep soundly (0) > I don't sleep soundly because of my lung condition (5) 3  I have lots of energy (0) > I have no energy at all (5) 2  Total CAT Score: 21 (high)  -Pulmonary function testing: FEV1 58% (2019) -Exacerbations requiring treatment in last 6 months: None -Patient reports consistent use of maintenance inhaler -Frequency of rescue inhaler use: 1-2 times daily  -Will set up patient for OV with provider for pulmonary assessment due to concern of possible exacerbation  -Recommended to continue current medication  Depression/Anxiety (Goal: Achieve symptom remission) -Controlled -Current treatment: Duloxetine 30 mg daily  Hydroxyzine 50 mg twice daily as needed  Trazodone 100 mg nightly -Medications previously tried/failed: NA -PHQ9: 9 -GAD7: NA -Recommended to continue current medication  Chronic Pain (Goal: Achieve adequate pain control) -Controlled -Current treatment  Morphine MS 30 mg twice daily  Oxycodone 10 mg four times daily as needed  -Medications previously tried: NA  -Recommended to continue current medication   Patient Goals/Self-Care Activities Patient will:  - check glucose twice daily, before breakfast and at bedtime, document, and provide at future appointments check blood pressure weekly, document, and provide at future appointments  Follow Up Plan: Telephone follow up appointment with care management team member scheduled for:  02/15/2021 at 3:00 PM    Patient agreed to services and verbal consent obtained.   Patient verbalizes understanding of instructions provided today  and agrees to view in [place].   Malva Limes, [facility] [phone]

## 2020-11-02 ENCOUNTER — Telehealth: Payer: Self-pay

## 2020-11-02 NOTE — Telephone Encounter (Signed)
Copied from [place] [phone]. Topic: General - Other >> Nov 02, 2020  1:08 PM Leward Quan A wrote: Reason for CRM: Patient called in and ask Dr [name] if she can please prescribe her some Zofran say she have been vomiting the past few days since the promethazine (PHENERGAN) 25 MG tablet is not working. Can be reached at Ph# promethazine (PHENERGAN) 25 MG tablet

## 2020-11-03 ENCOUNTER — Encounter: Payer: Self-pay | Admitting: Family Medicine

## 2020-11-03 ENCOUNTER — Telehealth (INDEPENDENT_AMBULATORY_CARE_PROVIDER_SITE_OTHER): Payer: Medicare Other | Admitting: Family Medicine

## 2020-11-03 DIAGNOSIS — K219 Gastro-esophageal reflux disease without esophagitis: Secondary | ICD-10-CM | POA: Diagnosis not present

## 2020-11-03 DIAGNOSIS — R110 Nausea: Secondary | ICD-10-CM | POA: Diagnosis not present

## 2020-11-03 MED ORDER — OMEPRAZOLE 40 MG PO CPDR: 40.0000 mg | DELAYED_RELEASE_CAPSULE | Freq: Every day | ORAL | 0 refills | Status: DC

## 2020-11-03 MED ORDER — ONDANSETRON 4 MG PO TBDP: 4.0000 mg | ORAL_TABLET | Freq: Three times a day (TID) | ORAL | 0 refills | Status: DC | PRN

## 2020-11-03 NOTE — Progress Notes (Signed)
Name: Tricia [name]   MRN: [id number]    DOB: [birth year]   Date:11/03/2020       Progress Note  Subjective  Chief Complaint  Vomiting  I connected with  Tricia [name]  on 11/03/20 at 10:40 AM EDT by a telephone  application and verified that I am speaking with the correct person using two identifiers.  I discussed the limitations of evaluation and management by telemedicine and the availability of in person appointments. The patient expressed understanding and agreed to proceed with the virtual visit  Staff also discussed with the patient that there may be a patient responsible charge related to this service. Patient Location: at home  Provider Location: [facility] Additional Individuals present: boyfriend   HPI  Chronic Nausea: patient has migraine headaches and Parkinson's disease, she sees Dr. Manuella [name] and he prescribes Promethazine 25 mg to take one daily prn nausea and used to control symptoms ( started a couple of years ago) however recently she has been vomiting medication and unable to keep medication down, this started to happen a couple of weeks ago. She initially thought is was secondary to viral illness ,. She denies no fever, chills, no blood in vomiting or stools, however has noticed increase in flatulence. She has been cutting down on caffeine intake because symptoms gets worse . She has GERD and takes Zantac 360 , she states she used to be on Prevacid but stopped about 6 weeks ago because it stopped working . She has not seen GI for the nausea. She has heartburn. She is not sure if she lost any weight since last visit, but she has been losing weight over the past year    Patient Active Problem List   Diagnosis Date Noted   Mild protein-calorie malnutrition ([place]) 09/26/2020   Centrilobular emphysema ([place]) 03/30/2020   History of prolonged Q-T interval on ECG 11/18/2019   GAD (generalized anxiety disorder) 10/13/2019   MDD (major depressive disorder), recurrent  episode, moderate ([place]) 10/13/2019   At risk for long QT syndrome 10/13/2019   Nonrheumatic mitral valve regurgitation 05/04/2019   Benign neoplasm of descending colon    Polyp of sigmoid colon    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   Polyneuropathy 10/21/2014   Chronic venous insufficiency 10/05/2014   Bilateral leg edema 08/16/2014   Major depression in partial remission ([place]) 08/16/2014   Acid reflux 08/16/2014   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   Asthma, moderate persistent 08/16/2014   Carpal tunnel syndrome 08/16/2014   Cervical pain 08/16/2014   CAFL (chronic airflow limitation) ([place]) 08/16/2014   Type 2 diabetes mellitus with peripheral neuropathy ([place]) 08/16/2014   Diabetes mellitus type 2, insulin dependent ([place]) 08/16/2014   Dyslipidemia 08/16/2014   Tobacco use disorder 08/16/2014   Essential (primary) hypertension 08/16/2014   Benign neoplasm of stomach 08/16/2014   Gout 08/16/2014   HLD (hyperlipidemia) 08/16/2014   Low back pain 08/16/2014   Lumbar radiculopathy 08/16/2014   Headache, migraine 08/16/2014   Arthralgia of multiple joints 08/16/2014   Avitaminosis D 08/16/2014   Primary osteoarthritis of both knees 06/22/2014   Benign essential tremor 10/02/2013   Cervical dystonia 10/02/2013   Chronic diastolic heart failure ([place]) 11/16/2012   Chronic pain 11/13/2012    Past Surgical History:  Procedure Laterality Date   CARPAL TUNNEL RELEASE Bilateral    x2 right, 1x on left   COLONOSCOPY     COLONOSCOPY WITH PROPOFOL N/A 04/25/2017   Procedure: COLONOSCOPY WITH PROPOFOL;  Surgeon: Allen Norris,  Darren, MD;  Location: [place];  Service: Endoscopy;  Laterality: N/A;  diabetic-oral med   DILATION AND CURETTAGE OF UTERUS     EXTERNAL EAR SURGERY Left    x2   POLYPECTOMY  04/25/2017   Procedure: POLYPECTOMY INTESTINAL;  Surgeon: [name], MD;  Location: [place];  Service: Endoscopy;;   SPINE SURGERY     herniated disc   TUBAL  LIGATION      Family History  Problem Relation Age of Onset   Emphysema Mother    Anxiety disorder Mother    Stroke Father    Throat cancer Father    Multiple sclerosis Daughter    Bipolar disorder Daughter    Cervical cancer Daughter    Bipolar disorder Daughter    Drug abuse Daughter    Lung cancer Maternal Aunt    Lung cancer Maternal Uncle    Lung cancer Maternal Grandmother    Lung cancer Maternal Grandfather     Social History   Socioeconomic History   Marital status: Divorced    Spouse name: Not on file   Number of children: 2   Years of education: Not on file   Highest education level: Associate degree: academic program  Occupational History   Occupation: Disability  Tobacco Use   Smoking status: Every Day    Packs/day: 1.00    Years: 41.00    Pack years: 41.00    Types: Cigarettes    Start date: 05/19/1977   Smokeless tobacco: Never  Vaping Use   Vaping Use: Former  Substance and Sexual Activity   Alcohol use: No    Alcohol/week: 0.0 standard drinks   Drug use: No   Sexual activity: Not Currently    Birth control/protection: None  Other Topics Concern   Not on file  Social History Narrative   Lives with her boyfriend, she has two daughters.    Social Determinants of Health   Financial Resource Strain: Low Risk    Difficulty of Paying Living Expenses: Not hard at all  Food Insecurity: No Food Insecurity   Worried About Charity fundraiser in the Last Year: Never true   [place] in the Last Year: Never true  Transportation Needs: No Transportation Needs   Lack of Transportation (Medical): No   Lack of Transportation (Non-Medical): No  Physical Activity: Inactive   Days of Exercise per Week: 0 days   Minutes of Exercise per Session: 0 min  Stress: Stress Concern Present   Feeling of Stress : Rather much  Social Connections: Moderately Isolated   Frequency of Communication with Friends and Family: More than three times a week   Frequency  of Social Gatherings with Friends and Family: Once a week   Attends Religious Services: Never   Marine scientist or Organizations: No   Attends Music therapist: Never   Marital Status: Living with partner  Intimate Partner Violence: Not At Risk   Fear of Current or Ex-Partner: No   Emotionally Abused: No   Physically Abused: No   Sexually Abused: No     Current Outpatient Medications:    blood glucose meter kit and supplies KIT, Dispense accuchek aviva plus; can change if needed e11.9, Disp: 1 each, Rfl: 0   budesonide-formoterol (SYMBICORT) 160-4.5 MCG/ACT inhaler, Inhale 2 puffs into the lungs in the morning and at bedtime., Disp: , Rfl:    busPIRone (BUSPAR) 7.5 MG tablet, Take 7.5 mg by mouth 2 (two) times  daily., Disp: , Rfl:    carbidopa-levodopa (SINEMET IR) 25-100 MG tablet, Take 1.5 tablets by mouth 3 (three) times daily., Disp: , Rfl:    cetirizine (ZYRTEC) 10 MG tablet, Take 10 mg by mouth daily as needed for allergies., Disp: , Rfl:    colchicine 0.6 MG tablet, TAKE 1 TABLET BY MOUTH ONCE DAILY, Disp: 30 tablet, Rfl: 0   Continuous Blood Gluc Sensor (DEXCOM G6 SENSOR) MISC, 1 each by Does not apply route as directed. Every 10 days, Disp: 3 each, Rfl: 5   Continuous Blood Gluc Transmit (DEXCOM G6 TRANSMITTER) MISC, 1 each by Does not apply route every 3 (three) months., Disp: 1 each, Rfl: EA   cyclobenzaprine (FLEXERIL) 10 MG tablet, Take 10 mg by mouth 3 (three) times daily. , Disp: , Rfl:    DULoxetine (CYMBALTA) 30 MG capsule, TAKE 1 CAPSULE BY MOUTH ONCE DAILY. *NEEDS APPOINTMENT FOR FURTHER FILLS*, Disp: 15 capsule, Rfl: 0   Empagliflozin-metFORMIN HCl (SYNJARDY) 12.5-500 MG TABS, Take 1 tablet by mouth 2 (two) times daily. In place of Metformin, Disp: 180 tablet, Rfl: 0   furosemide (LASIX) 40 MG tablet, Take 1 tablet (40 mg total) by mouth daily as needed., Disp: 30 tablet, Rfl: 2   gabapentin (NEURONTIN) 600 MG tablet, Take 1 tablet (600 mg total) by  mouth 3 (three) times daily., Disp: 90 tablet, Rfl: 3   glucose blood test strip, Use as directed to check blood glucose daily, Disp: 100 each, Rfl: 2   hydrOXYzine (ATARAX/VISTARIL) 25 MG tablet, Take 25 mg by mouth 2 (two) times daily., Disp: , Rfl:    hydrOXYzine (VISTARIL) 50 MG capsule, TAKE 1 CAPSULE BY MOUTH TWICE DAILY AS NEEDED FOR SEVERE ANXIETY SYMPTOMS, Disp: 60 capsule, Rfl: 1   insulin glargine (LANTUS SOLOSTAR) 100 UNIT/ML Solostar Pen, Inject 20 Units into the skin daily., Disp: 15 pen, Rfl: 1   Insulin Pen Needle 32G X 6 MM MISC, 2 each by Does not apply route daily., Disp: 200 each, Rfl: 2   ipratropium-albuterol (DUONEB) 0.5-2.5 (3) MG/3ML SOLN, Inhale 3 mLs into the lungs every 6 (six) hours as needed., Disp: 360 mL, Rfl: 3   lidocaine (LIDODERM) 5 %, 1 patch every 12 (twelve) hours as needed., Disp: , Rfl:    lubiprostone (AMITIZA) 24 MCG capsule, Take by mouth 2 (two) times daily as needed., Disp: , Rfl:    montelukast (SINGULAIR) 10 MG tablet, TAKE 1 TABLET BY MOUTH  DAILY IN THE AFTERNOON, Disp: 90 tablet, Rfl: 2   morphine (MS CONTIN) 30 MG 12 hr tablet, Take 30 mg by mouth every 12 (twelve) hours., Disp: , Rfl:    Oxycodone HCl 10 MG TABS, Take 10 mg by mouth 4 (four) times daily as needed. , Disp: , Rfl:    promethazine (PHENERGAN) 25 MG tablet, Take 25 mg by mouth daily as needed., Disp: , Rfl:    rosuvastatin (CRESTOR) 5 MG tablet, Take 1 tablet (5 mg total) by mouth at bedtime., Disp: 90 tablet, Rfl: 1   theophylline (UNIPHYL) 400 MG 24 hr tablet, Take 1 tablet by mouth daily. , Disp: , Rfl:    tiotropium (SPIRIVA) 18 MCG inhalation capsule, Place 1 capsule into inhaler and inhale daily. pm, Disp: , Rfl:    traZODone (DESYREL) 100 MG tablet, Take 1 tablet (100 mg total) by mouth at bedtime., Disp: 90 tablet, Rfl: 0   Ubrogepant (UBRELVY) 100 MG TABS, Take 100 mg by mouth daily as needed. May repeat in 2  hours, Disp: , Rfl:    VENTOLIN HFA 108 (90 Base) MCG/ACT inhaler,  INHALE 1 PUFF BY MOUTH AS NEEDED, Disp: 18 g, Rfl: 0  Allergies  Allergen Reactions   Augmentin [Amoxicillin-Pot Clavulanate] Diarrhea   Penicillins Itching    I personally reviewed active problem list, medication list, allergies, family history, social history, health maintenance with the patient/caregiver today.   ROS  Ten systems reviewed and is negative except as mentioned in HPI   Objective  Virtual encounter, vitals not obtained.  There is no height or weight on file to calculate BMI.  Physical Exam  Awake, alert and oriented   PHQ2/9: Depression screen [facility] 2/9 11/03/2020 09/26/2020 03/30/2020 01/28/2020 12/07/2019  Decreased Interest 3 1 1 3 2   Down, Depressed, Hopeless 3 3 1 2 2   PHQ - 2 Score 6 4 2 5 4   Altered sleeping 3 1 0 3 3  Tired, decreased energy 3 3 1 3 2   Change in appetite 0 1 0 1 1  Feeling bad or failure about yourself  1 0 0 0 1  Trouble concentrating 0 0 0 3 3  Moving slowly or fidgety/restless 0 0 0 3 2  Suicidal thoughts 0 0 0 0 0  PHQ-9 Score 13 9 3 18 16   Difficult doing work/chores - - - Very difficult Very difficult  Some recent data might be hidden   PHQ-2/9 Result is positive.    Fall Risk: Fall Risk  11/03/2020 09/26/2020 03/30/2020 01/28/2020 12/07/2019  Falls in the past year? 1 0 1 1 1   Number falls in past yr: 1 0 1 1 1   Comment - - 3-4 - -  Injury with Fall? 0 0 0 0 1  Comment - - - - -  Risk for fall due to : No Fall Risks - - History of fall(s);Orthopedic patient History of fall(s)  Risk for fall due to: Comment - - - - -  Follow up Falls prevention discussed - - - -     Assessment & Plan  1. Chronic nausea  - ondansetron (ZOFRAN ODT) 4 MG disintegrating tablet; Take 1 tablet (4 mg total) by mouth every 8 (eight) hours as needed for nausea or vomiting.  Dispense: 60 tablet; Refill: 0  Explained we have to find out the cause, we will also resume PPI now and switch to Zofran, discussed red flags and [facility] to [facility] to [facility]  2.  Gastroesophageal reflux disease without esophagitis  - omeprazole (PRILOSEC) 40 MG capsule; Take 1 capsule (40 mg total) by mouth daily.  Dispense: 90 capsule; Refill: 0 - Ambulatory referral to Gastroenterology   I discussed the assessment and treatment plan with the patient. The patient was provided an opportunity to ask questions and all were answered. The patient agreed with the plan and demonstrated an understanding of the instructions.  The patient was advised to call back or seek an in-person evaluation if the symptoms worsen or if the condition fails to improve as anticipated.  I provided 25  minutes of non-face-to-face time during this encounter.

## 2020-11-03 NOTE — Telephone Encounter (Signed)
Pt is scheduled for a virtual for today with Dr [name]

## 2020-11-18 DIAGNOSIS — J441 Chronic obstructive pulmonary disease with (acute) exacerbation: Secondary | ICD-10-CM | POA: Diagnosis not present

## 2020-11-18 DIAGNOSIS — E1142 Type 2 diabetes mellitus with diabetic polyneuropathy: Secondary | ICD-10-CM | POA: Diagnosis not present

## 2020-11-21 DIAGNOSIS — G894 Chronic pain syndrome: Secondary | ICD-10-CM | POA: Diagnosis not present

## 2020-11-21 DIAGNOSIS — R519 Headache, unspecified: Secondary | ICD-10-CM | POA: Diagnosis not present

## 2020-11-21 DIAGNOSIS — Z79891 Long term (current) use of opiate analgesic: Secondary | ICD-10-CM | POA: Diagnosis not present

## 2020-11-21 DIAGNOSIS — M542 Cervicalgia: Secondary | ICD-10-CM | POA: Diagnosis not present

## 2020-11-21 DIAGNOSIS — M25569 Pain in unspecified knee: Secondary | ICD-10-CM | POA: Diagnosis not present

## 2020-11-21 DIAGNOSIS — M5416 Radiculopathy, lumbar region: Secondary | ICD-10-CM | POA: Diagnosis not present

## 2020-11-21 DIAGNOSIS — K5903 Drug induced constipation: Secondary | ICD-10-CM | POA: Diagnosis not present

## 2020-11-22 ENCOUNTER — Other Ambulatory Visit: Payer: Self-pay | Admitting: Family Medicine

## 2020-12-04 ENCOUNTER — Emergency Department: Payer: Medicare Other

## 2020-12-04 ENCOUNTER — Other Ambulatory Visit: Payer: Self-pay

## 2020-12-04 DIAGNOSIS — J449 Chronic obstructive pulmonary disease, unspecified: Secondary | ICD-10-CM | POA: Diagnosis not present

## 2020-12-04 DIAGNOSIS — R0602 Shortness of breath: Secondary | ICD-10-CM | POA: Insufficient documentation

## 2020-12-04 DIAGNOSIS — R5383 Other fatigue: Secondary | ICD-10-CM | POA: Diagnosis not present

## 2020-12-04 DIAGNOSIS — Z5321 Procedure and treatment not carried out due to patient leaving prior to being seen by health care provider: Secondary | ICD-10-CM | POA: Diagnosis not present

## 2020-12-04 DIAGNOSIS — J45909 Unspecified asthma, uncomplicated: Secondary | ICD-10-CM | POA: Insufficient documentation

## 2020-12-04 DIAGNOSIS — R059 Cough, unspecified: Secondary | ICD-10-CM | POA: Diagnosis not present

## 2020-12-04 DIAGNOSIS — Z20822 Contact with and (suspected) exposure to covid-19: Secondary | ICD-10-CM | POA: Diagnosis not present

## 2020-12-04 NOTE — ED Triage Notes (Signed)
Pt presents via POV with complaints of generalized body aches, fatigue, and SOB following a COVID exposure. Denies CP. No distress noted.

## 2020-12-04 NOTE — ED Triage Notes (Signed)
Pt to ED via POV with c/o productive cough, states was exposed to covid, pt's family member reports that patient's RA sats 91%, reports that patient has hx of COPD and asthma. Pt ambulatory to triage desk at this time with steady gait.

## 2020-12-05 ENCOUNTER — Emergency Department
Admission: EM | Admit: 2020-12-05 | Discharge: 2020-12-05 | Disposition: A | Discharge status: Home / Self Care | Payer: Medicare Other | Attending: Emergency Medicine | Admitting: Emergency Medicine

## 2020-12-05 LAB — RESP PANEL BY RT-PCR (FLU A&B, COVID) ARPGX2
Influenza A by PCR: NEGATIVE
Influenza B by PCR: NEGATIVE
SARS Coronavirus 2 by RT PCR: NEGATIVE

## 2020-12-05 NOTE — ED Notes (Signed)
Called again for rooming without answer

## 2020-12-05 NOTE — ED Notes (Signed)
Called x2 for rooming without answer, pt not outside lobby doors on looking for pt either.

## 2020-12-05 NOTE — ED Notes (Signed)
Called for rooming without answer

## 2020-12-05 NOTE — ED Notes (Signed)
No answer when called for rooming

## 2020-12-06 ENCOUNTER — Telehealth: Payer: Self-pay

## 2020-12-06 NOTE — Progress Notes (Signed)
Chronic Care Management Pharmacy Assistant   Name: Tricia [name]  MRN: [id number] DOB: 12-22-1960  Reason for Encounter: COPD Disease State Call/CAT Score  Recent office visits:  11/03/2020 [name], MD (PCP Video Visit) for Emesis- Started: Omeprazole 40 mg Daily, Ondansetron 4 mg q8h prn, Referral to Gastroenterology placed  Recent consult visits:  None ID  Hospital visits:  Medication Reconciliation was completed by comparing discharge summary, patient's EMR and Pharmacy list, and upon discussion with patient.  Admitted to the hospital on 12/05/2020 due to COVID Exposure/Shortness of Breath. Discharge date was 12/05/2020. Discharged from [facility] Emergency Department.    New?Medications Started at [facility] Discharge:?? Started None ID  Medication Changes at Hospital Discharge: -Changed None ID  Medications Discontinued at Hospital Discharge: -Stopped None ID  Medications that remain the same after Hospital Discharge:??  -All other medications will remain the same.    Medications: Outpatient Encounter Medications as of 12/06/2020  Medication Sig Note   blood glucose meter kit and supplies KIT Dispense accuchek aviva plus; can change if needed e11.9    budesonide-formoterol (SYMBICORT) 160-4.5 MCG/ACT inhaler Inhale 2 puffs into the lungs in the morning and at bedtime. 03/04/2020: Prescribed by Dr. Vella Kohler   busPIRone (BUSPAR) 7.5 MG tablet Take 7.5 mg by mouth 2 (two) times daily.    carbidopa-levodopa (SINEMET IR) 25-100 MG tablet Take 1.5 tablets by mouth 3 (three) times daily. 03/04/2020: Prescribed by Dr. Manuella Ghazi   cetirizine (ZYRTEC) 10 MG tablet Take 10 mg by mouth daily as needed for allergies.    colchicine 0.6 MG tablet TAKE 1 TABLET BY MOUTH ONCE DAILY    Continuous Blood Gluc Sensor (DEXCOM G6 SENSOR) MISC 1 each by Does not apply route as directed. Every 10 days    Continuous Blood Gluc Transmit (DEXCOM G6 TRANSMITTER) MISC  1 each by Does not apply route every 3 (three) months.    cyclobenzaprine (FLEXERIL) 10 MG tablet Take 10 mg by mouth 3 (three) times daily.  03/04/2020: Prescribed by Farris Has, PA-C   DULoxetine (CYMBALTA) 30 MG capsule TAKE 1 CAPSULE BY MOUTH ONCE DAILY. *NEEDS APPOINTMENT FOR FURTHER FILLS*    Empagliflozin-metFORMIN HCl (SYNJARDY) 12.5-500 MG TABS Take 1 tablet by mouth 2 (two) times daily. In place of Metformin    furosemide (LASIX) 40 MG tablet Take 1 tablet (40 mg total) by mouth daily as needed.    gabapentin (NEURONTIN) 600 MG tablet Take 1 tablet (600 mg total) by mouth 3 (three) times daily. 03/04/2020: Prescribed by Dr. Manuella Ghazi   glucose blood test strip Use as directed to check blood glucose daily    hydrOXYzine (ATARAX/VISTARIL) 25 MG tablet Take 25 mg by mouth 2 (two) times daily.    insulin glargine (LANTUS SOLOSTAR) 100 UNIT/ML Solostar Pen Inject 20 Units into the skin daily. 03/24/2020: Only taking PRN in the evenings if her sugars feel elevated   Insulin Pen Needle 32G X 6 MM MISC 2 each by Does not apply route daily.    ipratropium-albuterol (DUONEB) 0.5-2.5 (3) MG/3ML SOLN Inhale 3 mLs into the lungs every 6 (six) hours as needed.    lidocaine (LIDODERM) 5 % 1 patch every 12 (twelve) hours as needed. 03/04/2020: Prescribed by Farris Has, PA-C   lubiprostone (AMITIZA) 24 MCG capsule Take by mouth 2 (two) times daily as needed. 03/04/2020: Prescribed by Farris Has, PA-C   montelukast (SINGULAIR) 10 MG tablet TAKE 1 TABLET BY MOUTH  DAILY IN THE AFTERNOON  morphine (MS CONTIN) 30 MG 12 hr tablet Take 30 mg by mouth every 12 (twelve) hours. 03/04/2020: Prescribed by Farris Has, PA-C   omeprazole (PRILOSEC) 40 MG capsule Take 1 capsule (40 mg total) by mouth daily.    ondansetron (ZOFRAN ODT) 4 MG disintegrating tablet Take 1 tablet (4 mg total) by mouth every 8 (eight) hours as needed for nausea or vomiting.    Oxycodone HCl 10 MG TABS Take 10 mg by mouth 4 (four) times daily as  needed.  03/04/2020: Prescribed by Farris Has, PA-C   promethazine (PHENERGAN) 25 MG tablet Take 25 mg by mouth daily as needed. 03/04/2020: Prescribed by Dr. Manuella Ghazi   rosuvastatin (CRESTOR) 5 MG tablet Take 1 tablet (5 mg total) by mouth at bedtime.    theophylline (UNIPHYL) 400 MG 24 hr tablet Take 1 tablet by mouth daily.  03/04/2020: Prescribed by Dr. Vella Kohler   tiotropium (SPIRIVA) 18 MCG inhalation capsule Place 1 capsule into inhaler and inhale daily. pm 03/04/2020: Prescribed by Dr. Vella Kohler   traZODone (DESYREL) 100 MG tablet Take 1 tablet (100 mg total) by mouth at bedtime.    Ubrogepant (UBRELVY) 100 MG TABS Take 100 mg by mouth daily as needed. May repeat in 2 hours 03/04/2020: Prescribed by Dr. Ronnell Guadalajara HFA 108 (90 Base) MCG/ACT inhaler INHALE 1 PUFF BY MOUTH AS NEEDED    No facility-administered encounter medications on file as of 12/06/2020.   Care Gaps: Mammogram Zoster Vaccines PAP Smear COVID-19 Vaccine Booster 3 Diabetic Eye Exam (Last completed 02/27/2019) Influenza Vaccine (Last completed 10/14/2019)  Star Rating Drugs: Synjardy 12.5-500 mg last filled on 09/26/2020 for a 90-Day supply with [facility] Rosuvastatin 5 mg last filled on 09/26/2020 for a 90-Day supply with [facility]  Current COPD regimen:  Symbicort 2 puffs twice daily  DuoNeb  Montelukast 10 mg nightly  Theophylline 400 mg daily  Spiriva 18 mcg daily  Ventolin HFA 1 puff every 8 hours as needed   No flowsheet data found.  Any recent hospitalizations or ED visits since last visit with CPP? Yes  Adherence Review: Does the patient have >5 day gap between last estimated fill date for maintenance inhaler medications? No   Patient has scheduled telephone visit with Junius Argyle, CPP on 02/15/2021 _0 .  10/24 LVM requesting a return call 10/27 LVM requesting a return call 10/31 LVM requesting a return call  I have attempted without success to contact this patient by phone three times to  complete her monthly disease state call. I have left voice messages requesting this patient to return my calls.   Lynann Bologna, CPA/CMA Clinical Pharmacist Assistant Phone: [phone]

## 2020-12-13 ENCOUNTER — Encounter: Payer: Self-pay | Admitting: Gastroenterology

## 2020-12-13 ENCOUNTER — Ambulatory Visit (INDEPENDENT_AMBULATORY_CARE_PROVIDER_SITE_OTHER): Payer: Medicare Other | Admitting: Gastroenterology

## 2020-12-13 ENCOUNTER — Other Ambulatory Visit: Payer: Self-pay

## 2020-12-13 VITALS — BP 126/78 | HR 82 | Temp 97.7°F | Ht 69.0 in | Wt 156.0 lb

## 2020-12-13 DIAGNOSIS — R1319 Other dysphagia: Secondary | ICD-10-CM | POA: Diagnosis not present

## 2020-12-13 DIAGNOSIS — K219 Gastro-esophageal reflux disease without esophagitis: Secondary | ICD-10-CM | POA: Diagnosis not present

## 2020-12-13 NOTE — Progress Notes (Signed)
[name] [address]  [place]  [place], [place] [address]  Main: [phone]  Fax: [phone]   Gastroenterology Consultation  Referring Provider:     [name], MD Primary Care Physician:  [name], MD Reason for Consultation:     GERD        HPI:    Chief complaint: GERD, nausea  Tricia [name] is a 60 y.o. y/o female referred for consultation & management  by Dr. [name], Drue Stager, MD. patient reports chronic history of GERD and has been on Prilosec for years and reports frequent burning sensation in chest despite this.  Also reports intermittent dysphagia.  Reports associated nausea and states that has had nausea for years and used to be on Phenergan previously and is now on Zofran.  Attributes this to diabetes.  No prior EGD.  Reports 3 pound weight loss since seeing her PCP in August 2022.  Documented weights show that patient was 170 pounds in February 2022 and is 156 pounds today.  Describes unintentional weight loss.  No blood in stool.  Reports chronic constipation as well due to opioid use.  Patient had a screening colonoscopy in 2019 with Dr. Allen Norris that reported a poor prep.  Hyperplastic polyps were removed.  As per most recent PCP note on 09/26/2020, patient was also taking over-the-counter Pepcid for her GERD  Past Medical History:  Diagnosis Date   Anxiety    Arthritis    joints and hands/ knees   Asthma    uses inhaler   Benign essential tremor    head   Cervical dystonia    neck pain   Cholesteatoma of left ear    x2   COPD (chronic obstructive pulmonary disease) (HCC)    Cough    Depression    Diabetes mellitus without complication (HCC)    type 2   Diastolic dysfunction    Dyspnea    Dysrhythmia    diastolic dysfunction   GERD (gastroesophageal reflux disease)    Headache    migraines/ one per week   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   Hyperlipidemia    Hypertension    Motion sickness     boat   Neuromuscular disorder ([place])    neuropathy feet and hands( nerve damage)   Wears dentures    upper and lower    Past Surgical History:  Procedure Laterality Date   CARPAL TUNNEL RELEASE Bilateral    x2 right, 1x on left   COLONOSCOPY     COLONOSCOPY WITH PROPOFOL N/A 04/25/2017   Procedure: COLONOSCOPY WITH PROPOFOL;  Surgeon: [name], MD;  Location: [facility];  Service: Endoscopy;  Laterality: N/A;  diabetic-oral med   DILATION AND CURETTAGE OF UTERUS     EXTERNAL EAR SURGERY Left    x2   POLYPECTOMY  04/25/2017   Procedure: POLYPECTOMY INTESTINAL;  Surgeon: [name], MD;  Location: [facility];  Service: Endoscopy;;   SPINE SURGERY     herniated disc   TUBAL LIGATION      Prior to Admission medications   Medication Sig Start Date End Date Taking? Authorizing Provider  blood glucose meter kit and supplies KIT Dispense accuchek aviva plus; can change if needed e11.9 12/27/17  Yes [name], [name], NP  budesonide-formoterol (SYMBICORT) 160-4.5 MCG/ACT inhaler Inhale 2 puffs into the lungs in the morning and at bedtime. 06/08/19  Yes Erby Pian, MD  busPIRone (BUSPAR) 7.5 MG  tablet Take 7.5 mg by mouth 2 (two) times daily. 10/26/20  Yes [provider]  carbidopa-levodopa (SINEMET IR) 25-100 MG tablet Take 1.5 tablets by mouth 3 (three) times daily. 01/25/20 01/23/21 Yes [name], MD  cetirizine (ZYRTEC) 10 MG tablet Take 10 mg by mouth daily as needed for allergies.   Yes [provider]  colchicine 0.6 MG tablet TAKE 1 TABLET BY MOUTH ONCE DAILY 10/27/20  Yes Sowles, Drue Stager, MD  Continuous Blood Gluc Sensor (DEXCOM G6 SENSOR) MISC 1 each by Does not apply route as directed. Every 10 days 09/26/20  Yes Sowles, Drue Stager, MD  Continuous Blood Gluc Transmit (DEXCOM G6 TRANSMITTER) MISC 1 each by Does not apply route every 3 (three) months. 09/26/20  Yes Sowles, Drue Stager, MD  cyclobenzaprine (FLEXERIL) 10 MG tablet Take 10 mg by mouth 3  (three) times daily.  08/04/19  Yes [provider]  DULoxetine (CYMBALTA) 30 MG capsule TAKE 1 CAPSULE BY MOUTH ONCE DAILY. *NEEDS APPOINTMENT FOR FURTHER FILLS* 08/18/20  Yes Orlene Erm, MD  Empagliflozin-metFORMIN HCl (SYNJARDY) 12.5-500 MG TABS Take 1 tablet by mouth 2 (two) times daily. In place of Metformin 09/26/20  Yes Sowles, Drue Stager, MD  furosemide (LASIX) 40 MG tablet Take 1 tablet (40 mg total) by mouth daily as needed. 06/21/17  Yes [name], [name], NP  gabapentin (NEURONTIN) 600 MG tablet Take 1 tablet (600 mg total) by mouth 3 (three) times daily. 06/21/17  Yes [name], [name], NP  glucose blood test strip Use as directed to check blood glucose daily 05/13/20  Yes Sowles, Drue Stager, MD  hydrOXYzine (ATARAX/VISTARIL) 25 MG tablet Take 25 mg by mouth 2 (two) times daily. 10/26/20  Yes [provider]  insulin glargine (LANTUS SOLOSTAR) 100 UNIT/ML Solostar Pen Inject 20 Units into the skin daily. 08/05/19  Yes Sowles, Drue Stager, MD  Insulin Pen Needle 32G X 6 MM MISC 2 each by Does not apply route daily. 08/05/19  Yes Sowles, Drue Stager, MD  ipratropium-albuterol (DUONEB) 0.5-2.5 (3) MG/3ML SOLN Inhale 3 mLs into the lungs every 6 (six) hours as needed. 06/21/17  Yes [name], [name], NP  lidocaine (LIDODERM) 5 % 1 patch every 12 (twelve) hours as needed. 02/02/20  Yes [provider]  lubiprostone (AMITIZA) 24 MCG capsule Take by mouth 2 (two) times daily as needed. 02/05/20  Yes [provider]  montelukast (SINGULAIR) 10 MG tablet TAKE 1 TABLET BY MOUTH  DAILY IN THE AFTERNOON 04/02/19  Yes Sowles, Drue Stager, MD  morphine (MS CONTIN) 30 MG 12 hr tablet Take 30 mg by mouth every 12 (twelve) hours. 09/20/15  Yes [provider]  omeprazole (PRILOSEC) 40 MG capsule Take 1 capsule (40 mg total) by mouth daily. 11/03/20  Yes Sowles, Drue Stager, MD  ondansetron (ZOFRAN ODT) 4 MG disintegrating tablet Take 1 tablet (4 mg total) by mouth every 8 (eight) hours  as needed for nausea or vomiting. 11/03/20  Yes Sowles, Drue Stager, MD  Oxycodone HCl 10 MG TABS Take 10 mg by mouth 4 (four) times daily as needed.  10/12/19  Yes [provider]  promethazine (PHENERGAN) 25 MG tablet Take 25 mg by mouth daily as needed. 04/28/18  Yes [provider]  rosuvastatin (CRESTOR) 5 MG tablet Take 1 tablet (5 mg total) by mouth at bedtime. 09/26/20  Yes Sowles, Drue Stager, MD  theophylline (UNIPHYL) 400 MG 24 hr tablet Take 1 tablet by mouth daily.  12/27/17  Yes Erby Pian, MD  tiotropium (SPIRIVA) 18 MCG inhalation  capsule Place 1 capsule into inhaler and inhale daily. pm 08/27/13  Yes Erby Pian, MD  traZODone (DESYREL) 100 MG tablet Take 1 tablet (100 mg total) by mouth at bedtime. 03/23/20  Yes Eappen, Ria Clock, MD  Ubrogepant (UBRELVY) 100 MG TABS Take 100 mg by mouth daily as needed. May repeat in 2 hours 11/09/19  Yes [name], MD  VENTOLIN HFA 108 (90 Base) MCG/ACT inhaler INHALE 1 PUFF BY MOUTH AS NEEDED 04/26/20  Yes [name], MD    Family History  Problem Relation Age of Onset   Emphysema Mother    Anxiety disorder Mother    Stroke Father    Throat cancer Father    Multiple sclerosis Daughter    Bipolar disorder Daughter    Cervical cancer Daughter    Bipolar disorder Daughter    Drug abuse Daughter    Lung cancer Maternal Aunt    Lung cancer Maternal Uncle    Lung cancer Maternal Grandmother    Lung cancer Maternal Grandfather      Social History   Tobacco Use   Smoking status: Every Day    Packs/day: 1.00    Years: 41.00    Pack years: 41.00    Types: Cigarettes    Start date: 05/19/1977   Smokeless tobacco: Never  Vaping Use   Vaping Use: Former  Substance Use Topics   Alcohol use: No    Alcohol/week: 0.0 standard drinks   Drug use: No    Allergies as of 12/13/2020 - Review Complete 12/13/2020  Allergen Reaction Noted   Augmentin [amoxicillin-pot clavulanate] Diarrhea 11/12/2012   Penicillins Itching  08/16/2014    Review of Systems:    All systems reviewed and negative except where noted in HPI.   Physical Exam:  Constitutional: General:   Alert,  Well-developed, well-nourished, pleasant and cooperative in NAD BP 126/78   Pulse 82   Temp 97.7 F (36.5 C) (Oral)   Ht 5' 9"  (1.753 m)   Wt 156 lb (70.8 kg)   BMI 23.04 kg/m   Eyes:  Sclera clear, no icterus.   Conjunctiva pink. PERRLA  Ears:  No scars, lesions or masses, Normal auditory acuity. Nose:  No deformity, discharge, or lesions. Mouth:  No deformity or lesions, oropharynx pink & moist.  Neck:  Supple; no masses or thyromegaly.  Respiratory: Normal respiratory effort, Normal percussion  Gastrointestinal: Soft, non-tender and non-distended without masses, hepatosplenomegaly or hernias noted.  No guarding or rebound tenderness.     Cardiac: No clubbing or edema.  No cyanosis. Normal posterior tibial pedal pulses noted.  Lymphatic:  No significant cervical or axillary adenopathy.  Psych:  Alert and cooperative. Normal mood and affect.  Musculoskeletal:  Normal gait. Head normocephalic, atraumatic. Symmetrical without gross deformities. 5/5 Upper and Lower extremity strength bilaterally.  Skin: Warm. Intact without significant lesions or rashes. No jaundice.  Neurologic:  Face symmetrical, tongue midline, Normal sensation to touch;  grossly normal neurologically.  Psych:  Alert and oriented x3, Alert and cooperative. Normal mood and affect.   Labs: CBC    Component Value Date/Time   WBC 6.5 09/26/2020 1605   RBC 4.71 09/26/2020 1605   HGB 14.6 09/26/2020 1605   HGB 13.7 11/16/2013 2040   HCT 45.4 (H) 09/26/2020 1605   HCT 40.9 11/16/2013 2040   PLT 282 09/26/2020 1605   PLT 254 11/16/2013 2040   MCV 96.4 09/26/2020 1605   MCV 94 11/16/2013 2040   MCH 31.0 09/26/2020 1605  MCHC 32.2 09/26/2020 1605   RDW 11.8 09/26/2020 1605   RDW 14.0 11/16/2013 2040   LYMPHSABS 2,503 09/26/2020 1605   LYMPHSABS  2.7 11/16/2013 2040   MONOABS 0.4 08/27/2018 1401   MONOABS 0.6 11/16/2013 2040   EOSABS 182 09/26/2020 1605   EOSABS 0.1 11/16/2013 2040   BASOSABS 20 09/26/2020 1605   BASOSABS 0.1 11/16/2013 2040   BASOSABS 0 11/15/2011 1430   CMP     Component Value Date/Time   NA 140 09/26/2020 1605   NA 146 (H) 07/06/2015 0858   NA 131 (L) 11/16/2013 2040   K 4.3 09/26/2020 1605   K 4.0 11/16/2013 2040   CL 103 09/26/2020 1605   CL 95 (L) 11/16/2013 2040   CO2 30 09/26/2020 1605   CO2 26 11/16/2013 2040   GLUCOSE 155 (H) 09/26/2020 1605   GLUCOSE 277 (H) 11/16/2013 2040   BUN 14 09/26/2020 1605   BUN 9 07/06/2015 0858   BUN 7 11/16/2013 2040   CREATININE 0.72 09/26/2020 1605   CALCIUM 9.0 09/26/2020 1605   CALCIUM 7.9 (L) 11/16/2013 2040   PROT 6.4 09/26/2020 1605   PROT 6.9 07/06/2015 0858   PROT 7.1 10/07/2012 1503   ALBUMIN 4.1 08/27/2018 1401   ALBUMIN 4.2 07/06/2015 0858   ALBUMIN 2.9 (L) 10/07/2012 1503   AST 22 09/26/2020 1605   AST 16 10/07/2012 1503   ALT 25 09/26/2020 1605   ALT 14 10/07/2012 1503   ALKPHOS 77 08/27/2018 1401   ALKPHOS 106 10/07/2012 1503   BILITOT 0.5 09/26/2020 1605   BILITOT 0.3 07/06/2015 0858   BILITOT 0.3 10/07/2012 1503   GFRNONAA 76 08/05/2019 1504   GFRAA 88 08/05/2019 1504    Imaging Studies: No results found.  Assessment and Plan:   Tricia [name] is a 60 y.o. y/o female has been referred for GERD  Patient has chronic GERD symptoms and is also reporting intermittent dysphagia with worsening of GERD symptoms despite PPI therapy  We discussed EGD due to above symptoms, versus altering PPI dose and conservative management first.  However, given ongoing symptoms for years, patient would like to proceed with EGD at this time  However, patient walked out of the room before scheduling the procedure as she stated she had another appointment to go to, and will call us to schedule her EGD  I have discussed alternative options,  risks & benefits,  which include, but are not limited to, bleeding, infection, perforation,respiratory complication & drug reaction.  The patient agrees with this plan & written consent will be obtained.    I also discussed repeating her Colonoscopy given that poor prep was noted in 2019.  However, patient refuses even though possibility of missed polyps during that colonoscopy was discussed with her    Dr [name]  Speech recognition software was used to dictate the above note.

## 2020-12-13 NOTE — Addendum Note (Signed)
Addended by: Lurlean Nanny on: 12/13/2020 05:47 PM   Modules accepted: Orders, SmartSet

## 2020-12-14 ENCOUNTER — Other Ambulatory Visit: Payer: Self-pay | Admitting: Psychiatry

## 2020-12-14 ENCOUNTER — Other Ambulatory Visit: Payer: Self-pay | Admitting: Family Medicine

## 2020-12-14 DIAGNOSIS — E785 Hyperlipidemia, unspecified: Secondary | ICD-10-CM

## 2020-12-14 DIAGNOSIS — F331 Major depressive disorder, recurrent, moderate: Secondary | ICD-10-CM

## 2020-12-14 DIAGNOSIS — F411 Generalized anxiety disorder: Secondary | ICD-10-CM

## 2020-12-28 DIAGNOSIS — G894 Chronic pain syndrome: Secondary | ICD-10-CM | POA: Diagnosis not present

## 2020-12-28 DIAGNOSIS — M5416 Radiculopathy, lumbar region: Secondary | ICD-10-CM | POA: Diagnosis not present

## 2020-12-28 DIAGNOSIS — Z79891 Long term (current) use of opiate analgesic: Secondary | ICD-10-CM | POA: Diagnosis not present

## 2020-12-28 DIAGNOSIS — M542 Cervicalgia: Secondary | ICD-10-CM | POA: Diagnosis not present

## 2020-12-28 DIAGNOSIS — K5903 Drug induced constipation: Secondary | ICD-10-CM | POA: Diagnosis not present

## 2020-12-28 DIAGNOSIS — R519 Headache, unspecified: Secondary | ICD-10-CM | POA: Diagnosis not present

## 2020-12-28 DIAGNOSIS — M25569 Pain in unspecified knee: Secondary | ICD-10-CM | POA: Diagnosis not present

## 2020-12-29 ENCOUNTER — Encounter: Payer: Self-pay | Admitting: Gastroenterology

## 2021-01-10 ENCOUNTER — Ambulatory Visit: Payer: Medicare Other | Admitting: Anesthesiology

## 2021-01-10 ENCOUNTER — Ambulatory Visit
Admission: RE | Admit: 2021-01-10 | Discharge: 2021-01-10 | Disposition: A | Discharge status: Home / Self Care | Payer: Medicare Other | Attending: Gastroenterology | Admitting: Gastroenterology

## 2021-01-10 ENCOUNTER — Other Ambulatory Visit: Payer: Self-pay

## 2021-01-10 ENCOUNTER — Encounter: Payer: Self-pay | Admitting: Gastroenterology

## 2021-01-10 ENCOUNTER — Encounter
Admission: RE | Disposition: A | Discharge status: Home / Self Care | Payer: Self-pay | Source: Home / Self Care | Attending: Gastroenterology

## 2021-01-10 DIAGNOSIS — E1140 Type 2 diabetes mellitus with diabetic neuropathy, unspecified: Secondary | ICD-10-CM | POA: Diagnosis not present

## 2021-01-10 DIAGNOSIS — F419 Anxiety disorder, unspecified: Secondary | ICD-10-CM | POA: Diagnosis not present

## 2021-01-10 DIAGNOSIS — I10 Essential (primary) hypertension: Secondary | ICD-10-CM | POA: Insufficient documentation

## 2021-01-10 DIAGNOSIS — K2950 Unspecified chronic gastritis without bleeding: Secondary | ICD-10-CM | POA: Insufficient documentation

## 2021-01-10 DIAGNOSIS — F32A Depression, unspecified: Secondary | ICD-10-CM | POA: Insufficient documentation

## 2021-01-10 DIAGNOSIS — K2100 Gastro-esophageal reflux disease with esophagitis, without bleeding: Secondary | ICD-10-CM | POA: Diagnosis not present

## 2021-01-10 DIAGNOSIS — F17210 Nicotine dependence, cigarettes, uncomplicated: Secondary | ICD-10-CM | POA: Insufficient documentation

## 2021-01-10 DIAGNOSIS — K2270 Barrett's esophagus without dysplasia: Secondary | ICD-10-CM | POA: Insufficient documentation

## 2021-01-10 DIAGNOSIS — K31A0 Gastric intestinal metaplasia, unspecified: Secondary | ICD-10-CM | POA: Insufficient documentation

## 2021-01-10 DIAGNOSIS — R1319 Other dysphagia: Secondary | ICD-10-CM

## 2021-01-10 DIAGNOSIS — K219 Gastro-esophageal reflux disease without esophagitis: Secondary | ICD-10-CM | POA: Diagnosis not present

## 2021-01-10 DIAGNOSIS — K319 Disease of stomach and duodenum, unspecified: Secondary | ICD-10-CM

## 2021-01-10 DIAGNOSIS — K222 Esophageal obstruction: Secondary | ICD-10-CM | POA: Diagnosis not present

## 2021-01-10 DIAGNOSIS — K3189 Other diseases of stomach and duodenum: Secondary | ICD-10-CM | POA: Diagnosis not present

## 2021-01-10 DIAGNOSIS — K2289 Other specified disease of esophagus: Secondary | ICD-10-CM | POA: Diagnosis not present

## 2021-01-10 DIAGNOSIS — R1310 Dysphagia, unspecified: Secondary | ICD-10-CM | POA: Diagnosis not present

## 2021-01-10 DIAGNOSIS — J449 Chronic obstructive pulmonary disease, unspecified: Secondary | ICD-10-CM | POA: Insufficient documentation

## 2021-01-10 HISTORY — PX: ESOPHAGOGASTRODUODENOSCOPY (EGD) WITH PROPOFOL: SHX5813

## 2021-01-10 LAB — GLUCOSE, CAPILLARY
Glucose-Capillary: 161 mg/dL — ABNORMAL HIGH (ref 70–99)
Glucose-Capillary: 154 mg/dL — ABNORMAL HIGH (ref 70–99)

## 2021-01-10 SURGERY — ESOPHAGOGASTRODUODENOSCOPY (EGD) WITH PROPOFOL
Anesthesia: General

## 2021-01-10 MED ORDER — SODIUM CHLORIDE 0.9 % IV SOLN: INTRAVENOUS | Status: DC

## 2021-01-10 MED ORDER — LIDOCAINE HCL (CARDIAC) PF 100 MG/5ML IV SOSY
PREFILLED_SYRINGE | INTRAVENOUS | Status: DC | PRN
Start: 2021-01-10 — End: 2021-01-10
  Administered 2021-01-10: 50 mg via INTRAVENOUS

## 2021-01-10 MED ORDER — GLYCOPYRROLATE 0.2 MG/ML IJ SOLN
INTRAMUSCULAR | Status: DC | PRN
  Administered 2021-01-10: .1 mg via INTRAVENOUS

## 2021-01-10 MED ORDER — ACETAMINOPHEN 160 MG/5ML PO SOLN: 975.0000 mg | Freq: Once | ORAL | Status: DC | PRN

## 2021-01-10 MED ORDER — ACETAMINOPHEN 500 MG PO TABS: 1000.0000 mg | ORAL_TABLET | Freq: Once | ORAL | Status: DC | PRN

## 2021-01-10 MED ORDER — LACTATED RINGERS IV SOLN: INTRAVENOUS | Status: DC

## 2021-01-10 MED ORDER — PROPOFOL 10 MG/ML IV BOLUS
INTRAVENOUS | Status: DC | PRN
  Administered 2021-01-10 (×9): 30 mg via INTRAVENOUS
  Administered 2021-01-10: 40 mg via INTRAVENOUS
  Administered 2021-01-10: 20 mg via INTRAVENOUS
  Administered 2021-01-10: 120 mg via INTRAVENOUS

## 2021-01-10 MED ORDER — ONDANSETRON HCL 4 MG/2ML IJ SOLN: 4.0000 mg | Freq: Once | INTRAMUSCULAR | Status: DC | PRN

## 2021-01-10 SURGICAL SUPPLY — 34 items
BALLN DILATOR 10-12 8 (BALLOONS)
BALLN DILATOR 12-15 8 (BALLOONS)
BALLN DILATOR 15-18 8 (BALLOONS)
BALLN DILATOR CRE 0-12 8 (BALLOONS)
BALLN DILATOR ESOPH 8 10 CRE (MISCELLANEOUS) IMPLANT
BALLOON DILATOR 12-15 8 (BALLOONS) IMPLANT
BALLOON DILATOR 15-18 8 (BALLOONS) IMPLANT
BALLOON DILATOR CRE 0-12 8 (BALLOONS) IMPLANT
BLOCK BITE 60FR ADLT L/F GRN (MISCELLANEOUS) ×3 IMPLANT
CLIP HMST 235XBRD CATH ROT (MISCELLANEOUS) IMPLANT
CLIP RESOLUTION 360 11X235 (MISCELLANEOUS)
ELECT REM PT RETURN 9FT ADLT (ELECTROSURGICAL)
ELECTRODE REM PT RTRN 9FT ADLT (ELECTROSURGICAL) IMPLANT
FCP ESCP3.2XJMB 240X2.8X (MISCELLANEOUS)
FORCEPS BIOP RAD 4 LRG CAP 4 (CUTTING FORCEPS) ×3 IMPLANT
FORCEPS BIOP RJ4 240 W/NDL (MISCELLANEOUS)
FORCEPS ESCP3.2XJMB 240X2.8X (MISCELLANEOUS) IMPLANT
GOWN CVR UNV OPN BCK APRN NK (MISCELLANEOUS) ×4 IMPLANT
GOWN ISOL THUMB LOOP REG UNIV (MISCELLANEOUS) ×6
INJECTOR VARIJECT VIN23 (MISCELLANEOUS) IMPLANT
KIT DEFENDO VALVE AND CONN (KITS) IMPLANT
KIT PRC NS LF DISP ENDO (KITS) ×2 IMPLANT
KIT PROCEDURE OLYMPUS (KITS) ×3
MANIFOLD NEPTUNE II (INSTRUMENTS) ×3 IMPLANT
MARKER SPOT ENDO TATTOO 5ML (MISCELLANEOUS) IMPLANT
RETRIEVER NET PLAT FOOD (MISCELLANEOUS) IMPLANT
SNARE SHORT THROW 13M SML OVAL (MISCELLANEOUS) IMPLANT
SNARE SHORT THROW 30M LRG OVAL (MISCELLANEOUS) IMPLANT
SPOT EX ENDOSCOPIC TATTOO (MISCELLANEOUS)
SYR INFLATION 60ML (SYRINGE) ×3 IMPLANT
TRAP ETRAP POLY (MISCELLANEOUS) IMPLANT
VARIJECT INJECTOR VIN23 (MISCELLANEOUS)
WATER STERILE IRR 250ML POUR (IV SOLUTION) ×3 IMPLANT
WIRE CRE 18-20MM 8CM F G (MISCELLANEOUS) ×3 IMPLANT

## 2021-01-10 NOTE — Op Note (Signed)
[facility] Gastroenterology Patient Name: Tricia [name] Procedure Date: 01/10/2021 7:45 AM MRN: [id number] Account #: [id number] Date of Birth: 04-14-1960 Admit Type: Outpatient Age: 60 Room: [facility] OR ROOM 01 Gender: Female Note Status: Finalized Instrument Name: [id number] Procedure:             Upper GI endoscopy Indications:           Dysphagia, Follow-up of esophageal reflux Providers:             Olina Melfi B. Bonna Gains MD, MD Referring MD:          Bethena Roys. Sowles, MD (Referring MD) Medicines:             Monitored Anesthesia Care Complications:         No immediate complications. Procedure:             Pre-Anesthesia Assessment:                        - The risks and benefits of the procedure and the                         sedation options and risks were discussed with the                         patient. All questions were answered and informed                         consent was obtained.                        - Patient identification and proposed procedure were                         verified prior to the procedure.                        - ASA Grade Assessment: II - A patient with mild                         systemic disease.                        After obtaining informed consent, the endoscope was                         passed under direct vision. Throughout the procedure,                         the patient's blood pressure, pulse, and oxygen                         saturations were monitored continuously. The Endoscope                         was introduced through the mouth, and advanced to the                         second part of duodenum. The upper GI endoscopy was  accomplished with ease. The patient tolerated the                         procedure well. Findings:      A low-grade of narrowing Schatzki ring was found at the gastroesophageal       junction. A TTS dilator was passed through the scope. Dilation  with an       18-19-20 mm balloon dilator was performed to 20 mm. The dilation site       was examined and showed complete resolution of luminal narrowing and no       bleeding, mucosal tear or perforation.      One tongue of salmon-colored mucosa was present. Mucosa was biopsied       with a cold forceps for histology. Biopsies were obtained from the       proximal and distal esophagus with cold forceps for histology of       suspected eosinophilic esophagitis. These biopsies were done after       dilation to prevent tears or complications at biopsy site during       dilation.      The exam of the esophagus was otherwise normal.      Patchy mildly erythematous mucosa without bleeding was found in the       gastric antrum. Biopsies were obtained in the gastric body, at the       incisura and in the gastric antrum with cold forceps for Helicobacter       pylori testing.      A small amount of food (residue) was found in the gastric fundus. This       was cleared with suction.      The exam of the stomach was otherwise normal.      Mild mucosal changes characterized by thickened folds were found in the       second portion of the duodenum. Imaging was performed using white light       and narrow band imaging to visualize the mucosa. Biopsies were taken       with a cold forceps for histology.      The exam of the duodenum was otherwise normal. Impression:            - Low-grade of narrowing Schatzki ring. Dilated.                        - Salmon-colored mucosa suspicious for short-segment                         Barrett's esophagus. Biopsied.                        - Erythematous mucosa in the antrum.                        - A small amount of food (residue) in the stomach.                        - Mucosal changes in the duodenum. Biopsied.                        - Biopsies were obtained in the gastric body, at the  incisura and in the gastric  antrum. Recommendation:        - Await pathology results.                        - Follow an antireflux regimen.                        - Continue present medications.                        - Return to GI clinic as previously scheduled.                        - The findings and recommendations were discussed with                         the patient.                        - The findings and recommendations were discussed with                         the patient's family.                        - Resume previous diet. Procedure Code(s):     --- Professional ---                        [phone], Esophagogastroduodenoscopy, flexible,                         transoral; with transendoscopic balloon dilation of                         esophagus (less than 30 mm diameter)                        43239, 59, Esophagogastroduodenoscopy, flexible,                         transoral; with biopsy, single or multiple Diagnosis Code(s):     --- Professional ---                        K22.2, Esophageal obstruction                        K22.8, Other specified diseases of esophagus                        K31.89, Other diseases of stomach and duodenum                        R13.10, Dysphagia, unspecified                        K21.9, Gastro-esophageal reflux disease without                         esophagitis CPT copyright 2019 [employer] Medical Association. All rights reserved. The codes documented in this report are preliminary and upon coder review may  be revised to meet current  compliance requirements.  [name], MD Margretta Sidle B. Bonna Gains MD, MD 01/10/2021 8:36:44 AM This report has been signed electronically. Number of Addenda: 0 Note Initiated On: 01/10/2021 7:45 AM Estimated Blood Loss:  Estimated blood loss: none.      [facility]

## 2021-01-10 NOTE — H&P (Signed)
[name], MD [address] [place], [place], [place], [address] [address], [place], [place], [place], [address] Phone: [phone]  Fax: [phone]  Primary Care Physician:  [name], MD   Pre-Procedure History & Physical: HPI:  Tricia [name] is a 60 y.o. female is here for an EGD.   Past Medical History:  Diagnosis Date   Anxiety    Arthritis    joints and hands/ knees   Asthma    uses inhaler   Benign essential tremor    head   Cervical dystonia    neck pain   Cholesteatoma of left ear    x2   COPD (chronic obstructive pulmonary disease) (HCC)    Cough    Depression    Diabetes mellitus without complication (HCC)    type 2   Diastolic dysfunction    Dyspnea    Dysrhythmia    diastolic dysfunction   GERD (gastroesophageal reflux disease)    Headache    migraines/ one per week   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   Hyperlipidemia    Hypertension    Motion sickness    boat   Neuromuscular disorder ([place])    neuropathy feet and hands( nerve damage)   Wears dentures    upper and lower    Past Surgical History:  Procedure Laterality Date   CARPAL TUNNEL RELEASE Bilateral    x2 right, 1x on left   COLONOSCOPY     COLONOSCOPY WITH PROPOFOL N/A 04/25/2017   Procedure: COLONOSCOPY WITH PROPOFOL;  Surgeon: [name], MD;  Location: [facility];  Service: Endoscopy;  Laterality: N/A;  diabetic-oral med   DILATION AND CURETTAGE OF UTERUS     EXTERNAL EAR SURGERY Left    x2   POLYPECTOMY  04/25/2017   Procedure: POLYPECTOMY INTESTINAL;  Surgeon: [name], MD;  Location: [facility];  Service: Endoscopy;;   SPINE SURGERY     herniated disc   TUBAL LIGATION      Prior to Admission medications   Medication Sig Start Date End Date Taking? Authorizing Provider  budesonide-formoterol (SYMBICORT) 160-4.5 MCG/ACT inhaler Inhale 2 puffs into the lungs in the morning and at bedtime. 06/08/19  Yes Erby Pian, MD  busPIRone (BUSPAR) 7.5 MG tablet Take 7.5 mg by mouth 2 (two) times daily. 10/26/20  Yes [provider]  carbidopa-levodopa (SINEMET IR) 25-100 MG tablet Take 1.5 tablets by mouth 3 (three) times daily. 01/25/20 01/23/21 Yes [name], MD  cetirizine (ZYRTEC) 10 MG tablet Take 10 mg by mouth daily as needed for allergies.   Yes [provider]  colchicine 0.6 MG tablet TAKE 1 TABLET BY MOUTH ONCE DAILY 10/27/20  Yes Sowles, Drue Stager, MD  DULoxetine (CYMBALTA) 30 MG capsule TAKE 1 CAPSULE BY MOUTH ONCE DAILY. *NEEDS APPOINTMENT FOR FURTHER FILLS* 08/18/20  Yes Orlene Erm, MD  Empagliflozin-metFORMIN HCl (SYNJARDY) 12.5-500 MG TABS Take 1 tablet by mouth 2 (two) times daily. In place of Metformin 09/26/20  Yes Sowles, Drue Stager, MD  furosemide (LASIX) 40 MG tablet Take 1 tablet (40 mg total) by mouth daily as needed. 06/21/17  Yes [name], [name], NP  gabapentin (NEURONTIN) 600 MG tablet Take 1 tablet (600 mg total) by mouth 3 (three) times daily. 06/21/17  Yes [name], [name], NP  hydrOXYzine (ATARAX/VISTARIL) 25 MG tablet Take 25 mg by mouth 2 (two) times daily. 10/26/20  Yes [provider]  insulin glargine (LANTUS  SOLOSTAR) 100 UNIT/ML Solostar Pen Inject 20 Units into the skin daily. 08/05/19  Yes Sowles, Drue Stager, MD  ipratropium-albuterol (DUONEB) 0.5-2.5 (3) MG/3ML SOLN Inhale 3 mLs into the lungs every 6 (six) hours as needed. 06/21/17  Yes [name], [name], NP  lidocaine (LIDODERM) 5 % 1 patch every 12 (twelve) hours as needed. 02/02/20  Yes [provider]  lubiprostone (AMITIZA) 24 MCG capsule Take by mouth 2 (two) times daily as needed. 02/05/20  Yes [provider]  montelukast (SINGULAIR) 10 MG tablet TAKE 1 TABLET BY MOUTH  DAILY IN THE AFTERNOON 04/02/19  Yes Sowles, Drue Stager, MD  morphine (MS CONTIN) 30 MG 12 hr tablet Take 30 mg by mouth every 12 (twelve) hours. 09/20/15  Yes [provider]  omeprazole (PRILOSEC)  40 MG capsule Take 1 capsule (40 mg total) by mouth daily. 11/03/20  Yes Sowles, Drue Stager, MD  Oxycodone HCl 10 MG TABS Take 10 mg by mouth 4 (four) times daily as needed.  10/12/19  Yes [provider]  promethazine (PHENERGAN) 25 MG tablet Take 25 mg by mouth daily as needed. 04/28/18  Yes [provider]  rosuvastatin (CRESTOR) 5 MG tablet Take 1 tablet (5 mg total) by mouth at bedtime. 09/26/20  Yes Sowles, Drue Stager, MD  theophylline (UNIPHYL) 400 MG 24 hr tablet Take 1 tablet by mouth daily.  12/27/17  Yes Erby Pian, MD  tiotropium (SPIRIVA) 18 MCG inhalation capsule Place 1 capsule into inhaler and inhale daily. pm 08/27/13  Yes Erby Pian, MD  tiZANidine (ZANAFLEX) 4 MG tablet Take 4 mg by mouth every 6 (six) hours as needed for muscle spasms.   Yes [provider]  traZODone (DESYREL) 100 MG tablet Take 1 tablet (100 mg total) by mouth at bedtime. 03/23/20  Yes Eappen, Ria Clock, MD  Ubrogepant (UBRELVY) 100 MG TABS Take 100 mg by mouth daily as needed. May repeat in 2 hours 11/09/19  Yes [name], MD  VENTOLIN HFA 108 (90 Base) MCG/ACT inhaler INHALE 1 PUFF BY MOUTH AS NEEDED 04/26/20  Yes Sowles, Drue Stager, MD  blood glucose meter kit and supplies KIT Dispense accuchek aviva plus; can change if needed e11.9 12/27/17   [name], [name], NP  Continuous Blood Gluc Sensor (DEXCOM G6 SENSOR) MISC 1 each by Does not apply route as directed. Every 10 days 09/26/20   [name], MD  Continuous Blood Gluc Transmit (DEXCOM G6 TRANSMITTER) MISC 1 each by Does not apply route every 3 (three) months. 09/26/20   [name], MD  cyclobenzaprine (FLEXERIL) 10 MG tablet Take 10 mg by mouth 3 (three) times daily.  Patient not taking: Reported on 12/29/2020 08/04/19   [provider]  glucose blood test strip Use as directed to check blood glucose daily 05/13/20   [name], MD  Insulin Pen Needle 32G X 6 MM MISC 2 each by Does not apply route daily. 08/05/19    [name], MD  ondansetron (ZOFRAN ODT) 4 MG disintegrating tablet Take 1 tablet (4 mg total) by mouth every 8 (eight) hours as needed for nausea or vomiting. Patient not taking: Reported on 12/29/2020 11/03/20   [name], MD    Allergies as of 12/13/2020 - Review Complete 12/13/2020  Allergen Reaction Noted   Augmentin [amoxicillin-pot clavulanate] Diarrhea 11/12/2012   Penicillins Itching 08/16/2014    Family History  Problem Relation Age of Onset   Emphysema Mother    Anxiety disorder Mother    Stroke Father  Throat cancer Father    Multiple sclerosis Daughter    Bipolar disorder Daughter    Cervical cancer Daughter    Bipolar disorder Daughter    Drug abuse Daughter    Lung cancer Maternal Aunt    Lung cancer Maternal Uncle    Lung cancer Maternal Grandmother    Lung cancer Maternal Grandfather     Social History   Socioeconomic History   Marital status: Divorced    Spouse name: Not on file   Number of children: 2   Years of education: Not on file   Highest education level: Associate degree: academic program  Occupational History   Occupation: Disability  Tobacco Use   Smoking status: Every Day    Packs/day: 1.00    Years: 41.00    Pack years: 41.00    Types: Cigarettes    Start date: 05/19/1977   Smokeless tobacco: Never  Vaping Use   Vaping Use: Former  Substance and Sexual Activity   Alcohol use: No    Alcohol/week: 0.0 standard drinks   Drug use: No   Sexual activity: Not Currently    Birth control/protection: None  Other Topics Concern   Not on file  Social History Narrative   Lives with her boyfriend, she has two daughters.    Social Determinants of Health   Financial Resource Strain: Low Risk    Difficulty of Paying Living Expenses: Not hard at all  Food Insecurity: No Food Insecurity   Worried About Charity fundraiser in the Last Year: Never true   [place] in the Last Year: Never true  Transportation Needs: No  Transportation Needs   Lack of Transportation (Medical): No   Lack of Transportation (Non-Medical): No  Physical Activity: Inactive   Days of Exercise per Week: 0 days   Minutes of Exercise per Session: 0 min  Stress: Stress Concern Present   Feeling of Stress : Rather much  Social Connections: Moderately Isolated   Frequency of Communication with Friends and Family: More than three times a week   Frequency of Social Gatherings with Friends and Family: Once a week   Attends Religious Services: Never   Marine scientist or Organizations: No   Attends Music therapist: Never   Marital Status: Living with partner  Intimate Partner Violence: Not At Risk   Fear of Current or Ex-Partner: No   Emotionally Abused: No   Physically Abused: No   Sexually Abused: No    Review of Systems: See HPI, otherwise negative ROS  Constitutional: General:   Alert,  Well-developed, well-nourished, pleasant and cooperative in NAD BP 125/67   Pulse 90   Temp (!) 97.3 F (36.3 C) (Temporal)   Resp 16   Ht 5' 9"  (1.753 m)   Wt 70.8 kg   SpO2 95%   BMI 23.04 kg/m   Head: Normocephalic, atraumatic.   Eyes:  Sclera clear, no icterus.   Conjunctiva pink.   Mouth:  No deformity or lesions, oropharynx pink & moist.  Neck:  Supple, trachea midline  Respiratory: Normal respiratory effort  Gastrointestinal:  Soft, non-tender and non-distended without masses, hepatosplenomegaly or hernias noted.  No guarding or rebound tenderness.     Cardiac: No clubbing or edema.  No cyanosis. Normal posterior tibial pedal pulses noted.  Lymphatic:  No significant cervical adenopathy.  Psych:  Alert and cooperative. Normal mood and affect.  Musculoskeletal:   Symmetrical without gross deformities. 5/5 Lower extremity strength bilaterally.  Skin: Warm. Intact without significant lesions or rashes. No jaundice.  Neurologic:  Face symmetrical, tongue midline, Normal sensation to touch;  grossly  normal neurologically.  Psych:  Alert and oriented x3, Alert and cooperative. Normal mood and affect.  Impression/Plan: [name] is here for an EGD for Acid Reflux, dysphagia.  Risks, benefits, limitations, and alternatives regarding the procedure have been reviewed with the patient.  Questions have been answered.  All parties agreeable.   Virgel Manifold, MD  01/10/2021, 8:00 AM

## 2021-01-10 NOTE — Transfer of Care (Signed)
Immediate Anesthesia Transfer of Care Note  [name]  Procedure(s) Performed: ESOPHAGOGASTRODUODENOSCOPY (EGD) WITH PROPOFOL Balloon dilation wire-guided  Patient Location: PACU  Anesthesia Type: General  Level of Consciousness: awake, alert  and patient cooperative  Airway and Oxygen Therapy: Patient Spontanous Breathing and Patient connected to supplemental oxygen  Post-op Assessment: Post-op Vital signs reviewed, Patient's Cardiovascular Status Stable, Respiratory Function Stable, Patent Airway and No signs of Nausea or vomiting  Post-op Vital Signs: Reviewed and stable  Complications: No notable events documented.

## 2021-01-10 NOTE — Anesthesia Postprocedure Evaluation (Signed)
Anesthesia Post Note  [name]  Procedure(s) Performed: ESOPHAGOGASTRODUODENOSCOPY (EGD) WITH PROPOFOL Balloon dilation wire-guided     Patient location during evaluation: PACU Anesthesia Type: General Level of consciousness: awake and alert Pain management: pain level controlled Vital Signs Assessment: post-procedure vital signs reviewed and stable Respiratory status: spontaneous breathing, nonlabored ventilation and respiratory function stable Cardiovascular status: blood pressure returned to baseline and stable Postop Assessment: no apparent nausea or vomiting Anesthetic complications: no   No notable events documented.  April Manson

## 2021-01-10 NOTE — Anesthesia Preprocedure Evaluation (Signed)
Anesthesia Evaluation  Patient identified by MRN, date of birth, ID band Patient awake    Reviewed: Allergy & Precautions, H&P , NPO status , Patient's Chart, lab work & pertinent test results, reviewed documented beta blocker date and time   History of Anesthesia Complications Negative for: history of anesthetic complications  Airway Mallampati: II  TM Distance: >3 FB Neck ROM: full    Dental no notable dental hx. (+) Upper Dentures, Lower Dentures   Pulmonary neg pulmonary ROS, asthma , Current Smoker,    Pulmonary exam normal breath sounds clear to auscultation       Cardiovascular Exercise Tolerance: Good hypertension, negative cardio ROS Normal cardiovascular exam(-) dysrhythmias  Rhythm:regular Rate:Normal     Neuro/Psych Anxiety Depression HOH, hx cholesteatoma negative neurological ROS  negative psych ROS   GI/Hepatic negative GI ROS, Neg liver ROS,   Endo/Other  negative endocrine ROSdiabetes, Type 2  Renal/GU negative Renal ROS  negative genitourinary   Musculoskeletal   Abdominal   Peds  Hematology negative hematology ROS (+)   Anesthesia Other Findings   Reproductive/Obstetrics negative OB ROS                             Anesthesia Physical  Anesthesia Plan  ASA: 2  Anesthesia Plan: General   Post-op Pain Management:    Induction: Intravenous  PONV Risk Score and Plan: 2 and Propofol infusion and TIVA  Airway Management Planned: Natural Airway  Additional Equipment:   Intra-op Plan:   Post-operative Plan:   Informed Consent: I have reviewed the patients History and Physical, chart, labs and discussed the procedure including the risks, benefits and alternatives for the proposed anesthesia with the patient or authorized representative who has indicated his/her understanding and acceptance.     Dental Advisory Given  Plan Discussed with: CRNA  Anesthesia  Plan Comments:         Anesthesia Quick Evaluation

## 2021-01-10 NOTE — Anesthesia Procedure Notes (Signed)
Date/Time: 01/10/2021 8:07 AM Performed by: Cameron Ali, CRNA Pre-anesthesia Checklist: Patient identified, Emergency Drugs available, Suction available, Timeout performed and Patient being monitored Patient Re-evaluated:Patient Re-evaluated prior to induction Oxygen Delivery Method: Nasal cannula Placement Confirmation: positive ETCO2

## 2021-01-11 ENCOUNTER — Encounter: Payer: Self-pay | Admitting: Gastroenterology

## 2021-01-11 ENCOUNTER — Other Ambulatory Visit: Payer: Self-pay | Admitting: Family Medicine

## 2021-01-11 DIAGNOSIS — E1142 Type 2 diabetes mellitus with diabetic polyneuropathy: Secondary | ICD-10-CM

## 2021-01-11 LAB — SURGICAL PATHOLOGY

## 2021-01-17 ENCOUNTER — Other Ambulatory Visit: Payer: Self-pay

## 2021-01-17 ENCOUNTER — Telehealth: Payer: Self-pay

## 2021-01-17 DIAGNOSIS — E1142 Type 2 diabetes mellitus with diabetic polyneuropathy: Secondary | ICD-10-CM

## 2021-01-17 NOTE — Telephone Encounter (Signed)
Copied from [place] [id number]. Topic: General - Inquiry >> Jan 17, 2021 11:07 AM Robina Ade, Helene Kelp D wrote: Reason for CRM: Patient called and would like to know if Dr. [name] can call her in an Acute check or One touch meter kit. She had her annual with her insurance and they recommended her to get this. Any questions please call patient back, thanks.

## 2021-01-18 ENCOUNTER — Encounter: Payer: Self-pay | Admitting: Gastroenterology

## 2021-01-18 MED ORDER — BLOOD GLUCOSE MONITOR KIT: PACK | 0 refills | Status: DC

## 2021-01-30 NOTE — Progress Notes (Signed)
Name: Tricia [name]   MRN: [id number]    DOB: 09/24/1960   Date:01/31/2021       Progress Note  Subjective  Chief Complaint  Follow Up  HPI  Parkinson's: she was diagnosed in 2021 by Dr. Trena Platt PA  with right hemibody parkinson's . She is taking Sinemet IR. She states still has balanced problems and tremors are one and off    Diabetes insulin requiring: she states she has been taking medication currently Synjardi   A1C has been at goal , today is 6.1 %  .  She has dyslipidemia, and microalbuminuria.  She denies polyphagia, polydipsia or polyuria. Glucose at home between 130-140  She has associated dyslipidemia  and CKI , albuminuria on SGL-2 agonist . She is taking statin therapy  She has bp towards low end of normal and we will hold off on ARB/ACE   Chronic pain: sees pain clinic, currently going to [facility], she states pain is still up and down, worse pain is on lumbar spine, average pain 6/10 , but can go up to 8/10, right now she is in severe pain on lumbar spine   Gout: she has flares intermittently, she had two episodes in the past few months and takes colchicine prn , it works for her    Asthma and COPD and chronic airflow limitation:  under the care of Dr. Raul Del. She had CT chest done, seen by pulmonologist and referred to Endo by Dr. Raul Del for evaluation of thyroid nodule. She continues to have a productive cough and states worse over the past couple of weeks with green sputum, cough worse in am's and at night and also SOB She is not on home oxygen. She is  still smoking , she would like to quit but has tried multiple therapies without success. Advised to keep trying    CT chest from 03/17/2019  1. Insert lung rads 2 2. Bronchial wall thickening with fluid/debris in segmental and subsegmental airways to the right lower lobe. This may reflect infectious/inflammatory etiology but in the appropriate clinical setting, aspiration could have this appearance. 3.  1.8 cm right thyroid nodule. Recommend thyroid US.(Ref: J Am Coll Radiol. 2015 Feb;12(2): 143-50). 4.  Emphysema (ICD10-J43.9) and Aortic Atherosclerosis (ICD10-170.0)    Thyroid nodule: had US done by Dr. Manfred Shirts at [facility] 06/2019, she states they decided not to do a biopsy , repeat US today does not seem to indicate need for a biopsy, small lymphonodo on left side, seems reactive, but with weight loss, may need a biopsy    Impression:  Right mid cystic thyroid nodule measuring 1.8 cm  Bilateral enlarged lymph nodes, several of which have a more rounded,  atypical shape (see above)   Major Depression: chronic and recurrent, phq 9 is high. . She lives with boyfriend and he has DM and also some other medical problems, she lost to follow up with psychiatrist, explained importance of calling them back for refill of medication. She is aware I cannot refill it for her    CHF: doing well at this time,  she has orthopnea - she uses two pillows .She is now under the care of Dr. Ubaldo Glassing and is on daily lasix and Farxiga since 08/22. Tolerating it well. She is not on ARB or ACE due to low bp. She is on statin therapy now   Echo from 06/05/2019  NORMAL LEFT VENTRICULAR SYSTOLIC FUNCTION  NORMAL RIGHT VENTRICULAR SYSTOLIC FUNCTION  MILD VALVULAR REGURGITATION (See above)  NO VALVULAR STENOSIS  Closest EF: >55% (Estimated)  Aortic: MILD AR  Mitral: MILD MR  Tricuspid: MILD TR  Echo showed enlarged left and right atrium   Atherosclerosis of Aorta: discussed CT , she is now taking statin therapy and last LDL is down 37   Senile purpura: on both arms , reassurance given . Unchanged    Protein malnutrition: weight loss, without dieting , since last visit her weight has been stable, she only lost 2 lbs. She went to see GI and had EGD, but she refused colonoscopy. She is not up to date with mammogram but willing to have it scheduled. Offered to do her pap smear today but she refused   Patient Active  Problem List   Diagnosis Date Noted   Esophageal dysphagia    Gastric erythema    Columnar-lined esophagus    Mild protein-calorie malnutrition ([place]) 09/26/2020   Centrilobular emphysema ([place]) 03/30/2020   History of prolonged Q-T interval on ECG 11/18/2019   GAD (generalized anxiety disorder) 10/13/2019   MDD (major depressive disorder), recurrent episode, moderate ([place]) 10/13/2019   At risk for long QT syndrome 10/13/2019   Nonrheumatic mitral valve regurgitation 05/04/2019   Benign neoplasm of descending colon    Polyp of sigmoid colon    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   Polyneuropathy 10/21/2014   Chronic venous insufficiency 10/05/2014   Bilateral leg edema 08/16/2014   Major depression in partial remission ([place]) 08/16/2014   Acid reflux 08/16/2014   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   Asthma, moderate persistent 08/16/2014   Carpal tunnel syndrome 08/16/2014   Cervical pain 08/16/2014   CAFL (chronic airflow limitation) ([place]) 08/16/2014   Type 2 diabetes mellitus with peripheral neuropathy ([place]) 08/16/2014   Diabetes mellitus type 2, insulin dependent ([place]) 08/16/2014   Dyslipidemia 08/16/2014   Tobacco use disorder 08/16/2014   Essential (primary) hypertension 08/16/2014   Benign neoplasm of stomach 08/16/2014   Gout 08/16/2014   HLD (hyperlipidemia) 08/16/2014   Low back pain 08/16/2014   Lumbar radiculopathy 08/16/2014   Headache, migraine 08/16/2014   Arthralgia of multiple joints 08/16/2014   Avitaminosis D 08/16/2014   Primary osteoarthritis of both knees 06/22/2014   Benign essential tremor 10/02/2013   Cervical dystonia 10/02/2013   Chronic diastolic heart failure ([place]) 11/16/2012   Chronic pain 11/13/2012    Past Surgical History:  Procedure Laterality Date   CARPAL TUNNEL RELEASE Bilateral    x2 right, 1x on left   COLONOSCOPY     COLONOSCOPY WITH PROPOFOL N/A 04/25/2017   Procedure: COLONOSCOPY WITH PROPOFOL;  Surgeon: [name],  MD;  Location: [place];  Service: Endoscopy;  Laterality: N/A;  diabetic-oral med   DILATION AND CURETTAGE OF UTERUS     ESOPHAGOGASTRODUODENOSCOPY (EGD) WITH PROPOFOL N/A 01/10/2021   Procedure: ESOPHAGOGASTRODUODENOSCOPY (EGD) WITH PROPOFOL;  Surgeon: Virgel Manifold, MD;  Location: [place];  Service: Endoscopy;  Laterality: N/A;  Diabetic   EXTERNAL EAR SURGERY Left    x2   POLYPECTOMY  04/25/2017   Procedure: POLYPECTOMY INTESTINAL;  Surgeon: [name], MD;  Location: [facility];  Service: Endoscopy;;   SPINE SURGERY     herniated disc   TUBAL LIGATION      Family History  Problem Relation Age of Onset   Emphysema Mother    Anxiety disorder Mother    Stroke Father    Throat cancer Father    Multiple sclerosis Daughter    Bipolar disorder Daughter  Cervical cancer Daughter    Bipolar disorder Daughter    Drug abuse Daughter    Lung cancer Maternal Aunt    Lung cancer Maternal Uncle    Lung cancer Maternal Grandmother    Lung cancer Maternal Grandfather     Social History   Tobacco Use   Smoking status: Every Day    Packs/day: 1.00    Years: 41.00    Pack years: 41.00    Types: Cigarettes    Start date: 05/19/1977   Smokeless tobacco: Never  Substance Use Topics   Alcohol use: No    Alcohol/week: 0.0 standard drinks     Current Outpatient Medications:    blood glucose meter kit and supplies KIT, Dispense accuchek aviva plus; can change if needed e11.9, Disp: 1 each, Rfl: 0   budesonide-formoterol (SYMBICORT) 160-4.5 MCG/ACT inhaler, Inhale 2 puffs into the lungs in the morning and at bedtime., Disp: , Rfl:    busPIRone (BUSPAR) 7.5 MG tablet, Take 7.5 mg by mouth 2 (two) times daily., Disp: , Rfl:    cetirizine (ZYRTEC) 10 MG tablet, Take 10 mg by mouth daily as needed for allergies., Disp: , Rfl:    colchicine 0.6 MG tablet, TAKE 1 TABLET BY MOUTH ONCE DAILY, Disp: 30 tablet, Rfl: 0   DULoxetine (CYMBALTA) 30 MG capsule,  TAKE 1 CAPSULE BY MOUTH ONCE DAILY. *NEEDS APPOINTMENT FOR FURTHER FILLS*, Disp: 15 capsule, Rfl: 0   furosemide (LASIX) 40 MG tablet, Take 1 tablet (40 mg total) by mouth daily as needed., Disp: 30 tablet, Rfl: 2   gabapentin (NEURONTIN) 600 MG tablet, Take 1 tablet (600 mg total) by mouth 3 (three) times daily., Disp: 90 tablet, Rfl: 3   glucose blood test strip, Use as directed to check blood glucose daily, Disp: 100 each, Rfl: 2   hydrOXYzine (ATARAX/VISTARIL) 25 MG tablet, Take 25 mg by mouth 2 (two) times daily., Disp: , Rfl:    insulin glargine (LANTUS SOLOSTAR) 100 UNIT/ML Solostar Pen, Inject 20 Units into the skin daily., Disp: 15 pen, Rfl: 1   Insulin Pen Needle 32G X 6 MM MISC, 2 each by Does not apply route daily., Disp: 200 each, Rfl: 2   ipratropium-albuterol (DUONEB) 0.5-2.5 (3) MG/3ML SOLN, Inhale 3 mLs into the lungs every 6 (six) hours as needed., Disp: 360 mL, Rfl: 3   lidocaine (LIDODERM) 5 %, 1 patch every 12 (twelve) hours as needed., Disp: , Rfl:    lubiprostone (AMITIZA) 24 MCG capsule, Take by mouth 2 (two) times daily as needed., Disp: , Rfl:    montelukast (SINGULAIR) 10 MG tablet, TAKE 1 TABLET BY MOUTH  DAILY IN THE AFTERNOON, Disp: 90 tablet, Rfl: 2   morphine (MS CONTIN) 30 MG 12 hr tablet, Take 30 mg by mouth every 12 (twelve) hours., Disp: , Rfl:    naproxen sodium (ALEVE) 220 MG tablet, Take 220 mg by mouth. PRN, Disp: , Rfl:    omeprazole (PRILOSEC) 40 MG capsule, Take 1 capsule (40 mg total) by mouth daily., Disp: 90 capsule, Rfl: 0   ondansetron (ZOFRAN ODT) 4 MG disintegrating tablet, Take 1 tablet (4 mg total) by mouth every 8 (eight) hours as needed for nausea or vomiting., Disp: 60 tablet, Rfl: 0   Oxycodone HCl 10 MG TABS, Take 10 mg by mouth 4 (four) times daily as needed. , Disp: , Rfl:    promethazine (PHENERGAN) 25 MG tablet, Take 25 mg by mouth daily as needed., Disp: , Rfl:    theophylline (  UNIPHYL) 400 MG 24 hr tablet, Take 1 tablet by mouth daily. ,  Disp: , Rfl:    tiotropium (SPIRIVA) 18 MCG inhalation capsule, Place 1 capsule into inhaler and inhale daily. pm, Disp: , Rfl:    tiZANidine (ZANAFLEX) 4 MG tablet, Take 4 mg by mouth every 6 (six) hours as needed for muscle spasms., Disp: , Rfl:    traZODone (DESYREL) 100 MG tablet, Take 1 tablet (100 mg total) by mouth at bedtime., Disp: 90 tablet, Rfl: 0   Ubrogepant (UBRELVY) 100 MG TABS, Take 100 mg by mouth daily as needed. May repeat in 2 hours, Disp: , Rfl:    VENTOLIN HFA 108 (90 Base) MCG/ACT inhaler, INHALE 1 PUFF BY MOUTH AS NEEDED, Disp: 18 g, Rfl: 0   carbidopa-levodopa (SINEMET IR) 25-100 MG tablet, Take 1.5 tablets by mouth 3 (three) times daily., Disp: , Rfl:    Empagliflozin-metFORMIN HCl (SYNJARDY) 12.5-500 MG TABS, Take 1 tablet by mouth 2 (two) times daily., Disp: 180 tablet, Rfl: 1   rosuvastatin (CRESTOR) 5 MG tablet, Take 1 tablet (5 mg total) by mouth at bedtime., Disp: 90 tablet, Rfl: 1  Allergies  Allergen Reactions   Augmentin [Amoxicillin-Pot Clavulanate] Diarrhea   Penicillins Itching    I personally reviewed active problem list, medication list, allergies, family history, social history, health maintenance with the patient/caregiver today.   ROS  Constitutional: Negative for fever or weight change.  Respiratory: Positive  for chronic cough and shortness of breath.   Cardiovascular: Negative for chest pain or palpitations.  Gastrointestinal: Negative for abdominal pain, no bowel changes.  Musculoskeletal: Negative for gait problem or joint swelling.  Skin: Negative for rash.  Neurological: Negative for dizziness or headache.  No other specific complaints in a complete review of systems (except as listed in HPI above).   Objective  Vitals:   01/31/21 1421  BP: 112/66  Pulse: 96  Resp: 16  Temp: 98 F (36.7 C)  SpO2: 95%  Weight: 157 lb (71.2 kg)  Height: $Remove'5\' 9"'aHYEjtk$  (1.753 m)    Body mass index is 23.18 kg/m.  Physical Exam  Constitutional:  Patient appears well-developed , temporal waisting.  No distress.  HEENT: head atraumatic, normocephalic, pupils equal and reactive to light,  neck supple Cardiovascular: Normal rate, regular rhythm and normal heart sounds.  No murmur heard. Trace  BLE edema. Pulmonary/Chest: Effort normal and breath sounds normal. No respiratory distress. Abdominal: Soft.  There is no tenderness. Psychiatric: Patient has a normal mood and affect. behavior is normal. Judgment and thought content normal.   Recent Results (from the past 2160 hour(s))  Resp Panel by RT-PCR (Flu A&B, Covid) Nasopharyngeal Swab     Status: None   Collection Time: 12/04/20 11:28 PM   Specimen: Nasopharyngeal Swab; Nasopharyngeal(NP) swabs in vial transport medium  Result Value Ref Range   SARS Coronavirus 2 by RT PCR NEGATIVE NEGATIVE    Comment: (NOTE) SARS-CoV-2 target nucleic acids are NOT DETECTED.  The SARS-CoV-2 RNA is generally detectable in upper respiratory specimens during the acute phase of infection. The lowest concentration of SARS-CoV-2 viral copies this assay can detect is 138 copies/mL. A negative result does not preclude SARS-Cov-2 infection and should not be used as the sole basis for treatment or other patient management decisions. A negative result may occur with  improper specimen collection/handling, submission of specimen other than nasopharyngeal swab, presence of viral mutation(s) within the areas targeted by this assay, and inadequate number of viral copies(<138 copies/mL). A negative  result must be combined with clinical observations, patient history, and epidemiological information. The expected result is Negative.  Fact Sheet for Patients:  EntrepreneurPulse.com.au  Fact Sheet for Healthcare Providers:  IncredibleEmployment.be  This test is no t yet approved or cleared by the [place] FDA and  has been authorized for detection and/or diagnosis of  SARS-CoV-2 by FDA under an Emergency Use Authorization (EUA). This EUA will remain  in effect (meaning this test can be used) for the duration of the COVID-19 declaration under Section 564(b)(1) of the Act, 21 U.S.C.section 360bbb-3(b)(1), unless the authorization is terminated  or revoked sooner.       Influenza A by PCR NEGATIVE NEGATIVE   Influenza B by PCR NEGATIVE NEGATIVE    Comment: (NOTE) The Xpert Xpress SARS-CoV-2/FLU/RSV plus assay is intended as an aid in the diagnosis of influenza from Nasopharyngeal swab specimens and should not be used as a sole basis for treatment. Nasal washings and aspirates are unacceptable for Xpert Xpress SARS-CoV-2/FLU/RSV testing.  Fact Sheet for Patients: EntrepreneurPulse.com.au  Fact Sheet for Healthcare Providers: IncredibleEmployment.be  This test is not yet approved or cleared by the [place] FDA and has been authorized for detection and/or diagnosis of SARS-CoV-2 by FDA under an Emergency Use Authorization (EUA). This EUA will remain in effect (meaning this test can be used) for the duration of the COVID-19 declaration under Section 564(b)(1) of the Act, 21 U.S.C. section 360bbb-3(b)(1), unless the authorization is terminated or revoked.  Performed at [facility], [address] [place], [place] [address]   Glucose, capillary     Status: Abnormal   Collection Time: 01/10/21  7:24 AM  Result Value Ref Range   Glucose-Capillary 161 (H) 70 - 99 mg/dL    Comment: Glucose reference range applies only to samples taken after fasting for at least 8 hours.  Surgical pathology     Status: None   Collection Time: 01/10/21  7:37 AM  Result Value Ref Range   SURGICAL PATHOLOGY      SURGICAL PATHOLOGY CASE: [id number] PATIENT: [name] Surgical Pathology Report     Specimen Submitted: A. Duodenum, thickened fold; cbx B. Stomach erythema; cbx C. Stomach mucosa;  cbx D. Esophagus; cbx  Clinical History: GERD K21.9, dysphagia R13.9.  GERD, dysphagia      DIAGNOSIS: A. DUODENUM, THICKENED FOLD; COLD BIOPSY: - DUODENAL MUCOSA WITH NO SIGNIFICANT PATHOLOGIC ALTERATION. - NEGATIVE FOR FEATURES OF CELIAC DISEASE. - NEGATIVE FOR DYSPLASIA AND MALIGNANCY.  B. STOMACH ERYTHEMA; COLD BIOPSY: - CHRONIC GASTRITIS WITH INTESTINAL METAPLASIA. - NEGATIVE FOR ACTIVE INFLAMMATION AND H PYLORI. - NEGATIVE FOR DYSPLASIA AND MALIGNANCY.  C. GEJ; COLD BIOPSY: - BARRETT'S ESOPHAGUS. - NEGATIVE FOR DYSPLASIA AND MALIGNANCY.  D. ESOPHAGUS; COLD BIOPSY: - SQUAMOUS MUCOSA WITH REFLUX ESOPHAGITIS. - NEGATIVE FOR INCREASED EOSINOPHILS. - NEGATIVE FOR INTESTINAL METAPLASIA, DYSPLASIA, AND MALIGNANCY.   GROSS DESCRIPTION: A. Labeled: Thicke ned fold of duodenum (per requisition cbx) Received: Formalin Collection time: 7:37 AM on 01/10/2021 Placed into formalin time: 7:37 AM on 01/10/2021 Tissue fragment(s): Multiple Size: Aggregate, 1.1 x 0.4 x 0.2 cm Description: Tan soft tissue fragments Entirely submitted in 1 cassette.  B. Labeled: Gastric erythema rule out H. pylori (per requisition cold biopsy) Received: Formalin Collection time: 8:18 AM on 01/10/2021 Placed into formalin time: 8:18 AM on 01/10/2021 Tissue fragment(s): 3 Size: Aggregate, 0.9 x 0.5 x 0.2 cm Description: Tan soft tissue fragments Entirely submitted in 1 cassette.  C. Labeled: Salmon-colored mucosa (per requisition stomach, cold biopsy) Received:  Formalin Collection time: 8:26 AM on 01/10/2021 Placed into formalin time: 8:26 AM on 01/10/2021 Tissue fragment(s): Multiple Size: Aggregate, 0.8 x 0.4 x 0.2 cm Description: Tan soft tissue fragments Entirely submitted in 1 cassette.  D. Labeled: Esophageal biopsy rule out EOE (per  requisition cold biopsy) Received: Formalin Collection time: 8:28 AM on 01/10/2021 Placed into formalin time: 8:28 AM on 01/10/2021 Tissue  fragment(s): Multiple Size: Aggregate, 1.7 x 0.5 x 0.2 cm Description: White translucent soft tissue fragments Entirely submitted in 1 cassette.  RB 01/10/2021   Final Diagnosis performed by Betsy Pries, MD.   Electronically signed 01/11/2021 9:09:29AM The electronic signature indicates that the named Attending Pathologist has evaluated the specimen Technical component performed at [facility], [address] [place], [place] [address] Lab: [phone] Dir: Rush Farmer, MD, MMM  Professional component performed at [facility], [facility], [place], [place], [place] [address] Lab: [phone] Dir: Kathi Simpers, MD   Glucose, capillary     Status: Abnormal   Collection Time: 01/10/21  8:38 AM  Result Value Ref Range   Glucose-Capillary 154 (H) 70 - 99 mg/dL    Comment: Glucose reference range applies only to samples taken after fasting for at least 8 hours.  POCT HgB A1C     Status: Abnormal   Collection Time: 01/31/21  2:28 PM  Result Value Ref Range   Hemoglobin A1C 6.1 (A) 4.0 - 5.6 %   HbA1c POC (<> result, manual entry)     HbA1c, POC (prediabetic range)     HbA1c, POC (controlled diabetic range)       PHQ2/9: Depression screen [facility] 2/9 01/31/2021 11/03/2020 09/26/2020 03/30/2020 01/28/2020  Decreased Interest _0 Down, Depressed, Hopeless _1 PHQ - 2 Score _2 Altered sleeping _3 0 3  Tired, decreased energy _4 Change in appetite 3 0 1 0 1  Feeling bad or failure about yourself  0 1 0 0 0  Trouble concentrating 2 0 0 0 3  Moving slowly or fidgety/restless 2 0 0 0 3  Suicidal thoughts 0 0 0 0 0  PHQ-9 Score _5 Difficult doing work/chores Not difficult at all - - - Very difficult  Some recent data might be hidden    phq 9 is positive   Fall Risk: Fall Risk  01/31/2021 01/31/2021 11/03/2020 09/26/2020 03/30/2020  Falls in the past year? _6 0 1  Number falls in past yr: _7 0 1  Comment - - - - 3-4   Injury with Fall? 0 0 0 0 0  Comment - - - - -  Risk for fall due to : History of fall(s) History of fall(s) No Fall Risks - -  Risk for fall due to: Comment - - - - -  Follow up Falls prevention discussed Falls prevention discussed Falls prevention discussed - -      Functional Status Survey: Is the patient deaf or have difficulty hearing?: No Does the patient have difficulty seeing, even when wearing glasses/contacts?: No Does the patient have difficulty concentrating, remembering, or making decisions?: No Does the patient have difficulty walking or climbing stairs?: No Does the patient have difficulty dressing or bathing?: No Does the patient have difficulty doing errands alone such as visiting a doctor's office or shopping?: No    Assessment & Plan  1. Type 2  diabetes mellitus with peripheral neuropathy (HCC)  - POCT HgB A1C - Empagliflozin-metFORMIN HCl (SYNJARDY) 12.5-500 MG TABS; Take 1 tablet by mouth 2 (two) times daily.  Dispense: 180 tablet; Refill: 1  2. Senile purpura ([place])   3. Atherosclerosis of aorta ([place])  On statin therapy    4. MDD (major depressive disorder), recurrent episode, moderate ([place])  Needs to call psychiatrist   5. COPD with asthma ([place])   6. Chronic diastolic heart failure ([place])   7. Centrilobular emphysema ([place])   8. Needs flu shot   9. Mild protein-calorie malnutrition ([place])   10. Breast cancer screening by mammogram  - MM 3D SCREEN BREAST BILATERAL; Future  11. Controlled gout   12. Dyslipidemia (high LDL; low HDL)  - rosuvastatin (CRESTOR) 5 MG tablet; Take 1 tablet (5 mg total) by mouth at bedtime.  Dispense: 90 tablet; Refill: 1  13. DM type 2 with diabetic dyslipidemia (HCC)  - rosuvastatin (CRESTOR) 5 MG tablet; Take 1 tablet (5 mg total) by mouth at bedtime.  Dispense: 90 tablet; Refill: 1

## 2021-01-31 ENCOUNTER — Other Ambulatory Visit: Payer: Self-pay

## 2021-01-31 ENCOUNTER — Ambulatory Visit (INDEPENDENT_AMBULATORY_CARE_PROVIDER_SITE_OTHER): Payer: Medicare Other

## 2021-01-31 ENCOUNTER — Encounter: Payer: Self-pay | Admitting: Family Medicine

## 2021-01-31 ENCOUNTER — Ambulatory Visit (INDEPENDENT_AMBULATORY_CARE_PROVIDER_SITE_OTHER): Payer: Medicare Other | Admitting: Family Medicine

## 2021-01-31 VITALS — BP 112/66 | HR 96 | Temp 98.0°F | Resp 16 | Ht 69.0 in | Wt 157.0 lb

## 2021-01-31 VITALS — BP 112/66 | HR 106 | Temp 98.0°F | Resp 17 | Ht 69.0 in | Wt 157.9 lb

## 2021-01-31 DIAGNOSIS — J432 Centrilobular emphysema: Secondary | ICD-10-CM

## 2021-01-31 DIAGNOSIS — J449 Chronic obstructive pulmonary disease, unspecified: Secondary | ICD-10-CM

## 2021-01-31 DIAGNOSIS — F331 Major depressive disorder, recurrent, moderate: Secondary | ICD-10-CM

## 2021-01-31 DIAGNOSIS — D692 Other nonthrombocytopenic purpura: Secondary | ICD-10-CM

## 2021-01-31 DIAGNOSIS — E441 Mild protein-calorie malnutrition: Secondary | ICD-10-CM | POA: Diagnosis not present

## 2021-01-31 DIAGNOSIS — Z0000 Encounter for general adult medical examination without abnormal findings: Secondary | ICD-10-CM | POA: Diagnosis not present

## 2021-01-31 DIAGNOSIS — E785 Hyperlipidemia, unspecified: Secondary | ICD-10-CM

## 2021-01-31 DIAGNOSIS — M109 Gout, unspecified: Secondary | ICD-10-CM

## 2021-01-31 DIAGNOSIS — I700 Atherosclerosis of aorta: Secondary | ICD-10-CM

## 2021-01-31 DIAGNOSIS — Z1231 Encounter for screening mammogram for malignant neoplasm of breast: Secondary | ICD-10-CM | POA: Diagnosis not present

## 2021-01-31 DIAGNOSIS — E1142 Type 2 diabetes mellitus with diabetic polyneuropathy: Secondary | ICD-10-CM

## 2021-01-31 DIAGNOSIS — I5032 Chronic diastolic (congestive) heart failure: Secondary | ICD-10-CM | POA: Diagnosis not present

## 2021-01-31 DIAGNOSIS — Z23 Encounter for immunization: Secondary | ICD-10-CM | POA: Diagnosis not present

## 2021-01-31 DIAGNOSIS — Z122 Encounter for screening for malignant neoplasm of respiratory organs: Secondary | ICD-10-CM

## 2021-01-31 DIAGNOSIS — E1169 Type 2 diabetes mellitus with other specified complication: Secondary | ICD-10-CM

## 2021-01-31 LAB — POCT GLYCOSYLATED HEMOGLOBIN (HGB A1C): Hemoglobin A1C: 6.1 % — AB (ref 4.0–5.6)

## 2021-01-31 MED ORDER — SYNJARDY 12.5-500 MG PO TABS: 1.0000 | ORAL_TABLET | Freq: Two times a day (BID) | ORAL | 1 refills | Status: DC

## 2021-01-31 MED ORDER — ROSUVASTATIN CALCIUM 5 MG PO TABS: 5.0000 mg | ORAL_TABLET | Freq: Every day | ORAL | 1 refills | Status: DC

## 2021-01-31 NOTE — Progress Notes (Signed)
Subjective:   Tricia [name] is a 60 y.o. female who presents for Medicare Annual (Subsequent) preventive examination.  Review of Systems     Cardiac Risk Factors include: diabetes mellitus;dyslipidemia;hypertension;smoking/ tobacco exposure     Objective:    Today's Vitals   01/31/21 1346 01/31/21 1349  BP: 112/66   Pulse: (!) 106   Resp: 17   Temp: 98 F (36.7 C)   TempSrc: Oral   SpO2: 93%   Weight: 157 lb 14.4 oz (71.6 kg)   Height: _0  (1.753 m)   PainSc:  8    Body mass index is 23.32 kg/m.  Advanced Directives 01/31/2021 01/10/2021 12/04/2020 01/28/2020 09/18/2019 01/27/2019 01/23/2018  Does Patient Have a Medical Advance Directive? _1  No No  Would patient like information on creating a medical advance directive? No - Patient declined Yes (MAU/Ambulatory/Procedural Areas - Information given) - No - Patient declined - Yes (MAU/Ambulatory/Procedural Areas - Information given) Yes (MAU/Ambulatory/Procedural Areas - Information given)    Current Medications (verified) Outpatient Encounter Medications as of 01/31/2021  Medication Sig   blood glucose meter kit and supplies KIT Dispense accuchek aviva plus; can change if needed e11.9   budesonide-formoterol (SYMBICORT) 160-4.5 MCG/ACT inhaler Inhale 2 puffs into the lungs in the morning and at bedtime.   busPIRone (BUSPAR) 7.5 MG tablet Take 7.5 mg by mouth 2 (two) times daily.   cetirizine (ZYRTEC) 10 MG tablet Take 10 mg by mouth daily as needed for allergies.   colchicine 0.6 MG tablet TAKE 1 TABLET BY MOUTH ONCE DAILY   DULoxetine (CYMBALTA) 30 MG capsule TAKE 1 CAPSULE BY MOUTH ONCE DAILY. *NEEDS APPOINTMENT FOR FURTHER FILLS*   furosemide (LASIX) 40 MG tablet Take 1 tablet (40 mg total) by mouth daily as needed.   gabapentin (NEURONTIN) 600 MG tablet Take 1 tablet (600 mg total) by mouth 3 (three) times daily.   glucose blood test strip Use as directed to check blood glucose daily   hydrOXYzine  (ATARAX/VISTARIL) 25 MG tablet Take 25 mg by mouth 2 (two) times daily.   insulin glargine (LANTUS SOLOSTAR) 100 UNIT/ML Solostar Pen Inject 20 Units into the skin daily.   Insulin Pen Needle 32G X 6 MM MISC 2 each by Does not apply route daily.   ipratropium-albuterol (DUONEB) 0.5-2.5 (3) MG/3ML SOLN Inhale 3 mLs into the lungs every 6 (six) hours as needed.   lidocaine (LIDODERM) 5 % 1 patch every 12 (twelve) hours as needed.   lubiprostone (AMITIZA) 24 MCG capsule Take by mouth 2 (two) times daily as needed.   montelukast (SINGULAIR) 10 MG tablet TAKE 1 TABLET BY MOUTH  DAILY IN THE AFTERNOON   morphine (MS CONTIN) 30 MG 12 hr tablet Take 30 mg by mouth every 12 (twelve) hours.   naproxen sodium (ALEVE) 220 MG tablet Take 220 mg by mouth. PRN   omeprazole (PRILOSEC) 40 MG capsule Take 1 capsule (40 mg total) by mouth daily.   ondansetron (ZOFRAN ODT) 4 MG disintegrating tablet Take 1 tablet (4 mg total) by mouth every 8 (eight) hours as needed for nausea or vomiting.   Oxycodone HCl 10 MG TABS Take 10 mg by mouth 4 (four) times daily as needed.    promethazine (PHENERGAN) 25 MG tablet Take 25 mg by mouth daily as needed.   rosuvastatin (CRESTOR) 5 MG tablet Take 1 tablet (5 mg total) by mouth at bedtime.   SYNJARDY 12.5-500 MG TABS TAKE 1 TABLET BY MOUTH TWICE DAILY *  IN PLACE OF METFORMIN*   theophylline (UNIPHYL) 400 MG 24 hr tablet Take 1 tablet by mouth daily.    tiotropium (SPIRIVA) 18 MCG inhalation capsule Place 1 capsule into inhaler and inhale daily. pm   tiZANidine (ZANAFLEX) 4 MG tablet Take 4 mg by mouth every 6 (six) hours as needed for muscle spasms.   traZODone (DESYREL) 100 MG tablet Take 1 tablet (100 mg total) by mouth at bedtime.   Ubrogepant (UBRELVY) 100 MG TABS Take 100 mg by mouth daily as needed. May repeat in 2 hours   VENTOLIN HFA 108 (90 Base) MCG/ACT inhaler INHALE 1 PUFF BY MOUTH AS NEEDED   carbidopa-levodopa (SINEMET IR) 25-100 MG tablet Take 1.5 tablets by mouth  3 (three) times daily.   [DISCONTINUED] Continuous Blood Gluc Sensor (DEXCOM G6 SENSOR) MISC 1 each by Does not apply route as directed. Every 10 days   [DISCONTINUED] Continuous Blood Gluc Transmit (DEXCOM G6 TRANSMITTER) MISC 1 each by Does not apply route every 3 (three) months.   [DISCONTINUED] cyclobenzaprine (FLEXERIL) 10 MG tablet Take 10 mg by mouth 3 (three) times daily.  (Patient not taking: Reported on 12/29/2020)   No facility-administered encounter medications on file as of 01/31/2021.    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   Hyperlipidemia    Hypertension    Motion sickness    boat   Neuromuscular disorder ([place])    neuropathy feet and hands( nerve damage)   Wears dentures    upper and lower   Past Surgical History:  Procedure Laterality Date   CARPAL TUNNEL RELEASE Bilateral    x2 right, 1x on left   COLONOSCOPY     COLONOSCOPY WITH PROPOFOL N/A 04/25/2017   Procedure: COLONOSCOPY WITH PROPOFOL;  Surgeon: [name], MD;  Location: [facility];  Service: Endoscopy;  Laterality: N/A;  diabetic-oral med   DILATION AND CURETTAGE OF UTERUS     ESOPHAGOGASTRODUODENOSCOPY (EGD) WITH PROPOFOL N/A 01/10/2021   Procedure: ESOPHAGOGASTRODUODENOSCOPY (EGD) WITH PROPOFOL;  Surgeon: Virgel Manifold, MD;  Location: [facility];  Service:  Endoscopy;  Laterality: N/A;  Diabetic   EXTERNAL EAR SURGERY Left    x2   POLYPECTOMY  04/25/2017   Procedure: POLYPECTOMY INTESTINAL;  Surgeon: [name], MD;  [facility];  Service: Endoscopy;;   SPINE SURGERY     herniated disc   TUBAL LIGATION     Family History  Problem Relation Age of Onset   Emphysema Mother    Anxiety disorder Mother    Stroke Father    Throat cancer Father    Multiple sclerosis Daughter    Bipolar disorder Daughter    Cervical cancer Daughter    Bipolar  disorder Daughter    Drug abuse Daughter    Lung cancer Maternal Aunt    Lung cancer Maternal Uncle    Lung cancer Maternal Grandmother    Lung cancer Maternal Grandfather    Social History   Socioeconomic History   Marital status: Divorced    Spouse name: Not on file   Number of children: 2   Years of education: Not on file   Highest education level: Associate degree: academic program  Occupational History   Occupation: Disability  Tobacco Use   Smoking status: Every Day    Packs/day: 1.00    Years: 41.00    Pack years: 41.00    Types: Cigarettes    Start date: 05/19/1977   Smokeless tobacco: Never  Vaping Use   Vaping Use: Former  Substance and Sexual Activity   Alcohol use: No    Alcohol/week: 0.0 standard drinks   Drug use: No   Sexual activity: Not Currently    Birth control/protection: None  Other Topics Concern   Not on file  Social History Narrative   Lives with her boyfriend, she has two daughters.    Social Determinants of Health   Financial Resource Strain: Low Risk    Difficulty of Paying Living Expenses: Not very hard  Food Insecurity: No Food Insecurity   Worried About Charity fundraiser in the Last Year: Never true   Ran Out of Food in the Last Year: Never true  Transportation Needs: No Transportation Needs   Lack of Transportation (Medical): No   Lack of Transportation (Non-Medical): No  Physical Activity: Inactive   Days of Exercise per  Week: 0 days   Minutes of Exercise per Session: 0 min  Stress: No Stress Concern Present   Feeling of Stress : Only a little  Social Connections: Moderately Isolated   Frequency of Communication with Friends and Family: More than three times a week   Frequency of Social Gatherings with Friends and Family: Once a week   Attends Religious Services: Never   Printmaker: Not on file   Attends Archivist Meetings: Never   Marital Status: Living with partner    Tobacco Counseling Ready to quit: Yes Counseling given: Yes   Clinical Intake:  Pre-visit preparation completed: Yes  Pain : 0-10 Pain Score: 8  Pain Type: Chronic pain Pain Location: Back Pain Orientation: Lower Pain Descriptors / Indicators: Aching, Operative site guarding Pain Onset: More than a month ago Pain Frequency: Constant     BMI - recorded: 23.32 Nutritional Status: BMI of 19-24  Normal Nutritional Risks: Nausea/ vomitting/ diarrhea (n/v/d for the past 72 hours) Diabetes: Yes CBG done?: No Did pt. bring in CBG monitor from home?: No  How often do you need to have someone help you when you read instructions, pamphlets, or other written materials from your doctor or pharmacy?: 1 - Never  Nutrition Risk Assessment:  Has the patient had any N/V/D within the last 2 months?  Yes  Does the patient have any non-healing wounds?  No  Has the patient had any unintentional weight loss or weight gain?  No   Diabetes:  Is the patient diabetic?  Yes  If diabetic, was a CBG obtained today?  No  Did the patient bring in their glucometer from home?  No  How often do you monitor your CBG's? daily.   Financial Strains and Diabetes Management:  Are you having any financial strains with the device, your  supplies or your medication? No .  Does the patient want to be seen by Chronic Care Management for management of their diabetes?  No  Would the patient like to be referred to a  Nutritionist or for Diabetic Management?  No   Diabetic Exams:  Diabetic Eye Exam: Completed 02/27/19 negative retinopathy. Overdue for diabetic eye exam. Pt has been advised about the importance in completing this exam.   Diabetic Foot Exam: Completed 09/26/20.   Interpreter Needed?: No  Information entered by :: Clemetine Marker LPN   Activities of Daily Living In your present state of health, do you have any difficulty performing the following activities: 01/31/2021 01/10/2021  Hearing? N N  Vision? N N  Difficulty concentrating or making decisions? N N  Walking or climbing stairs? N N  Dressing or bathing? N N  Doing errands, shopping? N -  Preparing Food and eating ? N -  Using the Toilet? N -  In the past six months, have you accidently leaked urine? Y -  Comment wears pads for protection -  Do you have problems with loss of bowel control? N -  Managing your Medications? N -  Managing your Finances? N -  Housekeeping or managing your Housekeeping? N -  Some recent data might be hidden    Patient Care Team: [name], MD as PCP - General (Family Medicine) Erby Pian, MD as Consulting Physician (Pulmonary Disease) [name], MD as Consulting Physician (Neurology) Ubaldo Glassing Javier Docker, MD as Consulting Physician (Cardiology) Germaine Pomfret, [facility] (Pharmacist) Zorita Pang, [place] as Physician Assistant (Pain Medicine) Virgel Manifold, MD as Consulting Physician (Gastroenterology)  Indicate any recent Medical Services you may have received from other than [facility] providers in the past year (date may be approximate).     Assessment:   This is a routine wellness examination for [place].  Hearing/Vision screen Hearing Screening - Comments:: Pt is partially deaf in left ear; needs hearing aids but cannot afford them.  Vision Screening - Comments:: Annual vision screenings done at [facility]; due for exam  Dietary issues and exercise activities  discussed: Current Exercise Habits: The patient does not participate in regular exercise at present, Exercise limited by: respiratory conditions(s);orthopedic condition(s)   Goals Addressed             This Visit's Progress    Quit Smoking       If you wish to quit smoking, help is available. For free tobacco cessation program offerings call the [facility] at [phone] or Live Well Line at [phone]. You may also visit [email or web address] or email [email or web address] for more information on other programs.         Depression Screen PHQ 2/9 Scores 01/31/2021 11/03/2020 09/26/2020 03/30/2020 01/28/2020 12/07/2019 10/14/2019  PHQ - 2 Score _1 PHQ- 9 Score _2 Fall Risk Fall Risk  01/31/2021 11/03/2020 09/26/2020 03/30/2020 01/28/2020  Falls in the past year? 1 1 0 1 1  Number falls in past yr: 1 1 0 1 1  Comment - - - 3-4 -  Injury with Fall? 0 0 0 0 0  Comment - - - - -  Risk for fall due to : History of fall(s) No Fall Risks - - History of fall(s);Orthopedic patient  Risk for fall due to: Comment - - - - -  Follow up Falls prevention discussed Falls prevention  discussed - - -    FALL RISK PREVENTION PERTAINING TO THE HOME:  Any stairs in or around the home? Yes  If so, are there any without handrails? No  Home free of loose throw rugs in walkways, pet beds, electrical cords, etc? Yes  Adequate lighting in your home to reduce risk of falls? Yes   ASSISTIVE DEVICES UTILIZED TO PREVENT FALLS:  Life alert? No  Use of a cane, walker or w/c? No  Grab bars in the bathroom? No  Shower chair or bench in shower? Yes  Elevated toilet seat or a handicapped toilet? Yes  TIMED UP AND GO:  Was the test performed? Yes .  Length of time to ambulate 10 feet: 5 sec.   Gait steady and fast without use of assistive device  Cognitive Function:     6CIT Screen 01/28/2020 01/27/2019 01/23/2018 01/01/2017  What Year? 0 points 0 points 0  points 0 points  What month? 0 points 0 points 0 points 0 points  What time? 0 points 0 points 0 points 0 points  Count back from 20 0 points 0 points 0 points 0 points  Months in reverse 0 points 0 points 0 points 0 points  Repeat phrase 0 points 0 points 0 points 0 points  Total Score 0 0 0 0    Immunizations Immunization History  Administered Date(s) Administered   Influenza,inj,Quad PF,6+ Mos 01/24/2015, 12/19/2016, 10/29/2017, 10/14/2019   Influenza-Unspecified 12/09/2015   PFIZER(Purple Top)SARS-COV-2 Vaccination 05/15/2019, 06/09/2019   Pneumococcal Polysaccharide-23 02/19/2009   Tdap 12/09/2012    TDAP status: Up to date  Flu Vaccine status: Due, Education has been provided regarding the importance of this vaccine. Advised may receive this vaccine at local pharmacy or Health Dept. Aware to provide a copy of the vaccination record if obtained from local pharmacy or Health Dept. Verbalized acceptance and understanding.  Pneumococcal vaccine status: Due, Education has been provided regarding the importance of this vaccine. Advised may receive this vaccine at local pharmacy or Health Dept. Aware to provide a copy of the vaccination record if obtained from local pharmacy or Health Dept. Verbalized acceptance and understanding.  Covid-19 vaccine status: Completed vaccines  Qualifies for Shingles Vaccine? Yes   Zostavax completed No   Shingrix Completed?: No.    Education has been provided regarding the importance of this vaccine. Patient has been advised to call insurance company to determine out of pocket expense if they have not yet received this vaccine. Advised may also receive vaccine at local pharmacy or Health Dept. Verbalized acceptance and understanding.  Screening Tests Health Maintenance  Topic Date Due   MAMMOGRAM  Never done   Zoster Vaccines- Shingrix (1 of 2) Never done   PAP SMEAR-Modifier  Never done   Pneumococcal Vaccine 17-57 Years old (2 - PCV) 02/19/2010    COVID-19 Vaccine (3 - Pfizer risk series) 07/07/2019   OPHTHALMOLOGY EXAM  02/27/2020   INFLUENZA VACCINE  09/19/2020   HEMOGLOBIN A1C  03/29/2021   FOOT EXAM  09/26/2021   URINE MICROALBUMIN  09/26/2021   TETANUS/TDAP  12/10/2022   COLONOSCOPY (Pts 45-35yr Insurance coverage will need to be confirmed)  04/26/2027   Hepatitis C Screening  Completed   HIV Screening  Completed   HPV VACCINES  Aged Out    Health Maintenance  Health Maintenance Due  Topic Date Due   MAMMOGRAM  Never done   Zoster Vaccines- Shingrix (1 of 2) Never done   PAP SMEAR-Modifier  Never done  Pneumococcal Vaccine 32-82 Years old (2 - PCV) 02/19/2010   COVID-19 Vaccine (3 - Pfizer risk series) 07/07/2019   OPHTHALMOLOGY EXAM  02/27/2020   INFLUENZA VACCINE  09/19/2020    Colorectal cancer screening: Type of screening: Colonoscopy. Completed 04/25/17. Repeat every 10 years  Mammogram status: pt declined  Bone density status: due age 89  Lung Cancer Screening: (Low Dose CT Chest recommended if Age 15-80 years, 30 pack-year currently smoking OR have quit w/in 15years.) does qualify.   Lung Cancer Screening Referral: A referral has been sent to [facility] Lung Cancer Screening regarding the possible need for this exam. [name] chart will be reviewed to determine if they qualify and the patient will be contacted to facilitate the scheduling of the Low Dose Chest CT for lung cancer screening.    Additional Screening:  Hepatitis C Screening: does qualify; Completed 08/27/18  Vision Screening: Recommended annual ophthalmology exams for early detection of glaucoma and other disorders of the eye. Is the patient up to date with their annual eye exam?  No  Who is the provider or what is the name of the office in which the patient attends annual eye exams? [place] Screening: Recommended annual dental exams for proper oral hygiene  Community Resource Referral / Chronic Care  Management: CRR required this visit?  No   CCM required this visit?  No      Plan:     I have personally reviewed and noted the following in the patients chart:   Medical and social history Use of alcohol, tobacco or illicit drugs  Current medications and supplements including opioid prescriptions.  Functional ability and status Nutritional status Physical activity Advanced directives List of other physicians Hospitalizations, surgeries, and ER visits in previous 12 months Vitals Screenings to include cognitive, depression, and falls Referrals and appointments  In addition, I have reviewed and discussed with patient certain preventive protocols, quality metrics, and best practice recommendations. A written personalized care plan for preventive services as well as general preventive health recommendations were provided to patient.     Clemetine Marker, LPN   [id number]   Nurse Notes: pt c/o ongoing productive cough with thick green mucous. Pt denies fever but does report chills, nasal congestion and body aches. Pt scheduled to see Dr. [name] today at 2:40. Pt reports negative at home Covid test approx 2 weeks ago and no known exposure to Covid or the flu.Pt is not currently taking any OTC meds for cough.

## 2021-01-31 NOTE — Patient Instructions (Signed)
Tricia [name] , Thank you for taking time to come for your Medicare Wellness Visit. I appreciate your ongoing commitment to your health goals. Please review the following plan we discussed and let me know if I can assist you in the future.   Screening recommendations/referrals: Colonoscopy: done 04/25/17. Repeat 04/2027 Mammogram: declined Bone Density: due age 60 Recommended yearly ophthalmology/optometry visit for glaucoma screening and checkup Recommended yearly dental visit for hygiene and checkup  Vaccinations: Influenza vaccine: due Pneumococcal vaccine: due for Prevnar20 Tdap vaccine: done 12/09/12 Shingles vaccine: Shingrix discussed. Please contact your pharmacy for coverage information.  Covid-19: done 05/15/19 & 06/09/19  Advanced directives: Please bring a copy of your health care power of attorney and living will to the office at your convenience once you have completed those documents.   Conditions/risks identified: If you wish to quit smoking, help is available. For free tobacco cessation program offerings call the [facility] at [phone] or Live Well Line at 6602468793. You may also visit [email or web address] or email [email or web address] for more information on other programs.   Next appointment: Follow up in one year for your annual wellness visit.   Preventive Care 40-64 Years, Female Preventive care refers to lifestyle choices and visits with your health care provider that can promote health and wellness. What does preventive care include? A yearly physical exam. This is also called an annual well check. Dental exams once or twice a year. Routine eye exams. Ask your health care provider how often you should have your eyes checked. Personal lifestyle choices, including: Daily care of your teeth and gums. Regular physical activity. Eating a healthy diet. Avoiding tobacco and drug use. Limiting alcohol use. Practicing safe sex. Taking  low-dose aspirin daily starting at age 58. Taking vitamin and mineral supplements as recommended by your health care provider. What happens during an annual well check? The services and screenings done by your health care provider during your annual well check will depend on your age, overall health, lifestyle risk factors, and family history of disease. Counseling  Your health care provider may ask you questions about your: Alcohol use. Tobacco use. Drug use. Emotional well-being. Home and relationship well-being. Sexual activity. Eating habits. Work and work Statistician. Method of birth control. Menstrual cycle. Pregnancy history. Screening  You may have the following tests or measurements: Height, weight, and BMI. Blood pressure. Lipid and cholesterol levels. These may be checked every 5 years, or more frequently if you are over 24 years old. Skin check. Lung cancer screening. You may have this screening every year starting at age 68 if you have a 30-pack-year history of smoking and currently smoke or have quit within the past 15 years. Fecal occult blood test (FOBT) of the stool. You may have this test every year starting at age 18. Flexible sigmoidoscopy or colonoscopy. You may have a sigmoidoscopy every 5 years or a colonoscopy every 10 years starting at age 58. Hepatitis C blood test. Hepatitis B blood test. Sexually transmitted disease (STD) testing. Diabetes screening. This is done by checking your blood sugar (glucose) after you have not eaten for a while (fasting). You may have this done every 1-3 years. Mammogram. This may be done every 1-2 years. Talk to your health care provider about when you should start having regular mammograms. This may depend on whether you have a family history of breast cancer. BRCA-related cancer screening. This may be done if you have a family history of breast, ovarian, tubal, or peritoneal  cancers. Pelvic exam and Pap test. This may be done  every 3 years starting at age 30. Starting at age 14, this may be done every 5 years if you have a Pap test in combination with an HPV test. Bone density scan. This is done to screen for osteoporosis. You may have this scan if you are at high risk for osteoporosis. Discuss your test results, treatment options, and if necessary, the need for more tests with your health care provider. Vaccines  Your health care provider may recommend certain vaccines, such as: Influenza vaccine. This is recommended every year. Tetanus, diphtheria, and acellular pertussis (Tdap, Td) vaccine. You may need a Td booster every 10 years. Zoster vaccine. You may need this after age 50. Pneumococcal 13-valent conjugate (PCV13) vaccine. You may need this if you have certain conditions and were not previously vaccinated. Pneumococcal polysaccharide (PPSV23) vaccine. You may need one or two doses if you smoke cigarettes or if you have certain conditions. Talk to your health care provider about which screenings and vaccines you need and how often you need them. This information is not intended to replace advice given to you by your health care provider. Make sure you discuss any questions you have with your health care provider. Document Released: 03/04/2015 Document Revised: 10/26/2015 Document Reviewed: 12/07/2014 Elsevier Interactive Patient Education  2017 [place] Prevention in the Home Falls can cause injuries. They can happen to people of all ages. There are many things you can do to make your home safe and to help prevent falls. What can I do on the outside of my home? Regularly fix the edges of walkways and driveways and fix any cracks. Remove anything that might make you trip as you walk through a door, such as a raised step or threshold. Trim any bushes or trees on the path to your home. Use bright outdoor lighting. Clear any walking paths of anything that might make someone trip, such as rocks or  tools. Regularly check to see if handrails are loose or broken. Make sure that both sides of any steps have handrails. Any raised decks and porches should have guardrails on the edges. Have any leaves, snow, or ice cleared regularly. Use sand or salt on walking paths during winter. Clean up any spills in your garage right away. This includes oil or grease spills. What can I do in the bathroom? Use night lights. Install grab bars by the toilet and in the tub and shower. Do not use towel bars as grab bars. Use non-skid mats or decals in the tub or shower. If you need to sit down in the shower, use a plastic, non-slip stool. Keep the floor dry. Clean up any water that spills on the floor as soon as it happens. Remove soap buildup in the tub or shower regularly. Attach bath mats securely with double-sided non-slip rug tape. Do not have throw rugs and other things on the floor that can make you trip. What can I do in the bedroom? Use night lights. Make sure that you have a light by your bed that is easy to reach. Do not use any sheets or blankets that are too big for your bed. They should not hang down onto the floor. Have a firm chair that has side arms. You can use this for support while you get dressed. Do not have throw rugs and other things on the floor that can make you trip. What can I do in the  kitchen? Clean up any spills right away. Avoid walking on wet floors. Keep items that you use a lot in easy-to-reach places. If you need to reach something above you, use a strong step stool that has a grab bar. Keep electrical cords out of the way. Do not use floor polish or wax that makes floors slippery. If you must use wax, use non-skid floor wax. Do not have throw rugs and other things on the floor that can make you trip. What can I do with my stairs? Do not leave any items on the stairs. Make sure that there are handrails on both sides of the stairs and use them. Fix handrails that are  broken or loose. Make sure that handrails are as long as the stairways. Check any carpeting to make sure that it is firmly attached to the stairs. Fix any carpet that is loose or worn. Avoid having throw rugs at the top or bottom of the stairs. If you do have throw rugs, attach them to the floor with carpet tape. Make sure that you have a light switch at the top of the stairs and the bottom of the stairs. If you do not have them, ask someone to add them for you. What else can I do to help prevent falls? Wear shoes that: Do not have high heels. Have rubber bottoms. Are comfortable and fit you well. Are closed at the toe. Do not wear sandals. If you use a stepladder: Make sure that it is fully opened. Do not climb a closed stepladder. Make sure that both sides of the stepladder are locked into place. Ask someone to hold it for you, if possible. Clearly mark and make sure that you can see: Any grab bars or handrails. First and last steps. Where the edge of each step is. Use tools that help you move around (mobility aids) if they are needed. These include: Canes. Walkers. Scooters. Crutches. Turn on the lights when you go into a dark area. Replace any light bulbs as soon as they burn out. Set up your furniture so you have a clear path. Avoid moving your furniture around. If any of your floors are uneven, fix them. If there are any pets around you, be aware of where they are. Review your medicines with your doctor. Some medicines can make you feel dizzy. This can increase your chance of falling. Ask your doctor what other things that you can do to help prevent falls. This information is not intended to replace advice given to you by your health care provider. Make sure you discuss any questions you have with your health care provider. Document Released: 12/02/2008 Document Revised: 07/14/2015 Document Reviewed: 03/12/2014 Elsevier Interactive Patient Education  2017 [employer].

## 2021-01-31 NOTE — Patient Instructions (Addendum)
Mammogram scheduled at [facility] on: Wednesday February 8th at 2:00 pm. Should you need to change or reschedule your appointment you can reach them at: [phone].

## 2021-02-15 ENCOUNTER — Telehealth: Payer: Medicare Other

## 2021-02-15 NOTE — Progress Notes (Deleted)
Chronic Care Management Pharmacy Note  02/15/2021 Name:  NOREEN MACKINTOSH MRN:  [id number] DOB:  [birth year]  Summary: Patient presents for CCM follow-up.   Recommendations/Changes made from today's visit:   Plan: CPP follow-up 3 months    Subjective: SEHAR SEDANO is an 60 y.o. year old female who is a primary patient of [name], MD.  The CCM team was consulted for assistance with disease management and care coordination needs.    Engaged with patient by telephone for follow up visit in response to provider referral for pharmacy case management and/or care coordination services.   Consent to Services:  The patient was given information about Chronic Care Management services, agreed to services, and gave verbal consent prior to initiation of services.  Please see initial visit note for detailed documentation.   Patient Care Team: [name], MD as PCP - General (Family Medicine) Erby Pian, MD as Consulting Physician (Pulmonary Disease) [name], MD as Consulting Physician (Neurology) Ubaldo Glassing Javier Docker, MD as Consulting Physician (Cardiology) Germaine Pomfret, [facility] (Pharmacist) Zorita Pang, [place] as Physician Assistant (Pain Medicine) Virgel Manifold, MD as Consulting Physician (Gastroenterology)  Recent office visits: 01/31/21: Patient presented to Dr. [name] for follow-up. Synjardy 12.5-500 mg twice daily 01/31/21: Patient presented to Clemetine Marker, LPN for AWV. 08/25/79: Patient presented to Dr. [name] for follow-up. Metformin stopped, Synjardy started.   Recent consult visits: 07/05/20: Patient presented to Dr. Raul Del (Pulmonology) for follow-up.   Hospital visits: None in previous 6 months   Objective:  Lab Results  Component Value Date   CREATININE 0.72 09/26/2020   BUN 14 09/26/2020   GFRNONAA 76 08/05/2019   GFRAA 88 08/05/2019   NA 140 09/26/2020   K 4.3 09/26/2020   CALCIUM 9.0 09/26/2020   CO2 30  09/26/2020   GLUCOSE 155 (H) 09/26/2020    Lab Results  Component Value Date/Time   HGBA1C 6.1 (A) 01/31/2021 02:28 PM   HGBA1C 7.0 (A) 09/26/2020 03:07 PM   HGBA1C 6.8 (H) 08/05/2019 03:04 PM   HGBA1C 6.7 (H) 08/27/2018 02:01 PM   HGBA1C 13.7 (A) 03/19/2018 02:04 PM   HGBA1C 14.0 (A) 10/29/2017 02:18 PM   HGBA1C 7.4 (H) 10/09/2012 04:52 AM   HGBA1C 7.1 (H) 07/02/2012 05:33 AM   MICROALBUR 46.0 09/26/2020 04:05 PM   MICROALBUR 1.2 08/05/2019 03:04 PM   MICROALBUR 50 03/19/2018 02:07 PM   MICROALBUR 100 01/24/2015 12:22 PM    Last diabetic Eye exam:  Lab Results  Component Value Date/Time   HMDIABEYEEXA No Retinopathy 02/27/2019 12:00 AM    Last diabetic Foot exam: No results found for: HMDIABFOOTEX   Lab Results  Component Value Date   CHOL 85 09/26/2020   HDL 31 (L) 09/26/2020   LDLCALC 37 09/26/2020   TRIG 88 09/26/2020   CHOLHDL 2.7 09/26/2020    Hepatic Function Latest Ref Rng & Units 09/26/2020 08/05/2019 08/27/2018  Total Protein 6.1 - 8.1 g/dL 6.4 6.5 7.0  Albumin 3.5 - 5.0 g/dL - - 4.1  AST 10 - 35 U/L 22 7(L) 18  ALT 6 - 29 U/L 25 8 12   Alk Phosphatase 38 - 126 U/L - - 77  Total Bilirubin 0.2 - 1.2 mg/dL 0.5 0.3 0.3    Lab Results  Component Value Date/Time   TSH 0.31 (L) 08/05/2019 03:04 PM   TSH 1.380 10/04/2014 04:20 PM    CBC Latest Ref Rng & Units 09/26/2020 08/05/2019 08/27/2018  WBC 3.8 - 10.8 Thousand/uL 6.5  8.3 6.3  Hemoglobin 11.7 - 15.5 g/dL 14.6 14.2 14.1  Hematocrit 35.0 - 45.0 % 45.4(H) 43.1 42.2  Platelets 140 - 400 Thousand/uL 282 206 235    Lab Results  Component Value Date/Time   VD25OH 21 (L) 09/26/2020 04:05 PM   VD25OH 17 (L) 08/05/2019 03:04 PM    Clinical ASCVD: No  The ASCVD Risk score (Arnett DK, et al., 2019) failed to calculate for the following reasons:   The valid total cholesterol range is 130 to 320 mg/dL    Depression screen [facility] 2/9 01/31/2021 11/03/2020 09/26/2020  Decreased Interest 3 3 1   Down, Depressed, Hopeless 3 3  3   PHQ - 2 Score 6 6 4   Altered sleeping 3 3 1   Tired, decreased energy 3 3 3   Change in appetite 3 0 1  Feeling bad or failure about yourself  0 1 0  Trouble concentrating 2 0 0  Moving slowly or fidgety/restless 2 0 0  Suicidal thoughts 0 0 0  PHQ-9 Score 19 13 9   Difficult doing work/chores Not difficult at all - -  Some recent data might be hidden    Social History   Tobacco Use  Smoking Status Every Day   Packs/day: 1.00   Years: 41.00   Pack years: 41.00   Types: Cigarettes   Start date: 05/19/1977  Smokeless Tobacco Never   BP Readings from Last 3 Encounters:  01/31/21 112/66  01/31/21 112/66  01/10/21 101/72   Pulse Readings from Last 3 Encounters:  01/31/21 96  01/31/21 (!) 106  01/10/21 83   Wt Readings from Last 3 Encounters:  01/31/21 157 lb (71.2 kg)  01/31/21 157 lb 14.4 oz (71.6 kg)  01/10/21 156 lb (70.8 kg)   BMI Readings from Last 3 Encounters:  01/31/21 23.18 kg/m  01/31/21 23.32 kg/m  01/10/21 23.04 kg/m    Assessment/Interventions: Review of patient past medical history, allergies, medications, health status, including review of consultants reports, laboratory and other test data, was performed as part of comprehensive evaluation and provision of chronic care management services.   SDOH:  (Social Determinants of Health) assessments and interventions performed: Yes   SDOH Screenings   Alcohol Screen: Low Risk    Last Alcohol Screening Score (AUDIT): 0  Depression (PHQ2-9): Medium Risk   PHQ-2 Score: 19  Financial Resource Strain: Low Risk    Difficulty of Paying Living Expenses: Not very hard  Food Insecurity: No Food Insecurity   Worried About Charity fundraiser in the Last Year: Never true   Ran Out of Food in the Last Year: Never true  Housing: Low Risk    Last Housing Risk Score: 0  Physical Activity: Inactive   Days of Exercise per Week: 0 days   Minutes of Exercise per Session: 0 min  Social Connections: Moderately  Isolated   Frequency of Communication with Friends and Family: More than three times a week   Frequency of Social Gatherings with Friends and Family: Once a week   Attends Religious Services: Never   Marine scientist or Organizations: Not on file   Attends Archivist Meetings: Never   Marital Status: Living with partner  Stress: No Stress Concern Present   Feeling of Stress : Only a little  Tobacco Use: High Risk   Smoking Tobacco Use: Every Day   Smokeless Tobacco Use: Never   Passive Exposure: Not on file  Transportation Needs: No Transportation Needs   Lack of Transportation (Medical): No  Lack of Transportation (Non-Medical): No    CCM Care Plan  Allergies  Allergen Reactions   Augmentin [Amoxicillin-Pot Clavulanate] Diarrhea   Penicillins Itching    Medications Reviewed Today     Reviewed by Carlene Coria, CMA (Certified Medical Assistant) on 01/31/21 at 1422  Med List Status: <None>   Medication Order Taking? Sig Documenting Provider Last Dose Status Informant  blood glucose meter kit and supplies KIT [id number] Yes Dispense accuchek aviva plus; can change if needed e11.9 [name], MD Taking Active   budesonide-formoterol [facility]) 160-4.5 MCG/ACT inhaler [id number] Yes Inhale 2 puffs into the lungs in the morning and at bedtime. Erby Pian, MD Taking Active            Med Note Michaelle Birks, Cathe Mons A   Fri Mar 04, 2020 10:56 AM) Prescribed by Dr. Vella Kohler  busPIRone (BUSPAR) 7.5 MG tablet [id number] Yes Take 7.5 mg by mouth 2 (two) times daily. [provider] Taking Active   carbidopa-levodopa (SINEMET IR) 25-100 MG tablet [id number]  Take 1.5 tablets by mouth 3 (three) times daily. [name], MD  Expired 01/23/21 2359            Med Note Michaelle Birks, Kaleigh Spiegelman A   Fri Mar 04, 2020 10:54 AM) Prescribed by Dr. Manuella Ghazi  cetirizine (ZYRTEC) 10 MG tablet [id number] Yes Take 10 mg by mouth daily as needed for allergies. [provider] Taking Active Self  colchicine 0.6 MG tablet [id number] Yes TAKE 1 TABLET BY MOUTH ONCE DAILY Sowles, Drue Stager, MD Taking Active   DULoxetine (CYMBALTA) 30 MG capsule [id number] Yes TAKE 1 CAPSULE BY MOUTH ONCE DAILY. *NEEDS APPOINTMENT FOR FURTHER FILLS* Orlene Erm, MD Taking Active   furosemide (LASIX) 40 MG tablet [id number] Yes Take 1 tablet (40 mg total) by mouth daily as needed. [name], NP Taking Active Self           Med Note Michaelle Birks, Cathe Mons A   Fri Mar 04, 2020 10:38 AM)    gabapentin (NEURONTIN) 600 MG tablet [id number] Yes Take 1 tablet (600 mg total) by mouth 3 (three) times daily. [name], NP Taking Active Self           Med Note Michaelle Birks, Glean Salvo   Fri Mar 04, 2020 10:54 AM) Prescribed by Dr. Manuella Ghazi  glucose blood test strip [id number] Yes Use as directed to check blood glucose daily [name], MD Taking Active   hydrOXYzine (ATARAX/VISTARIL) 25 MG tablet [id number] Yes Take 25 mg by mouth 2 (two) times daily. [provider] Taking Active   insulin glargine (LANTUS SOLOSTAR) 100 UNIT/ML Solostar Pen [id number] Yes Inject 20 Units into the skin daily. [name], MD Taking Active            Med Note Michaelle Birks, Cathe Mons A   Thu Mar 24, 2020 10:29 AM) Only taking PRN in the evenings if her sugars feel elevated  Insulin Pen Needle 32G X 6 MM MISC [id number] Yes 2 each by Does not apply route daily. [name], MD Taking Active   ipratropium-albuterol (DUONEB) 0.5-2.5 (3) MG/3ML SOLN [id number] Yes Inhale 3 mLs into the lungs every 6 (six) hours as needed. [name], NP Taking Active Self  lidocaine (LIDODERM) 5 % [id number] Yes 1 patch every 12 (twelve) hours as needed. [provider] Taking Active            Med Note Michaelle Birks, Glean Salvo   Fri Mar 04, 2020 10:58 AM) Prescribed by Arbie Cookey  Henson, PA-C  lubiprostone (AMITIZA) 24 MCG capsule [id number] Yes Take by mouth 2 (two) times daily as needed. [provider] Taking Active            Med Note Michaelle Birks, Glean Salvo   Fri Mar 04, 2020 10:58 AM) Prescribed by Farris Has, PA-C  montelukast (SINGULAIR) 10 MG tablet [id number] Yes TAKE 1 TABLET BY MOUTH  DAILY IN THE AFTERNOON [name], MD Taking Active   morphine (MS CONTIN) 30 MG 12 hr tablet [id number] Yes Take 30 mg by mouth every 12 (twelve) hours. [provider] Taking Active            Med Note Michaelle Birks, Glean Salvo   Fri Mar 04, 2020 10:58 AM) Prescribed by Farris Has, PA-C  naproxen sodium (ALEVE) 220 MG tablet [id number] Yes Take 220 mg by mouth. PRN [provider] Taking Active   omeprazole (PRILOSEC) 40 MG capsule [id number] Yes Take 1 capsule (40 mg total) by mouth daily. [name], MD Taking Active   ondansetron (ZOFRAN ODT) 4 MG disintegrating tablet [id number] Yes Take 1 tablet (4 mg total) by mouth every 8 (eight) hours as needed for nausea or vomiting. [name], MD Taking Active   Oxycodone HCl 10 MG TABS [id number] Yes Take 10 mg by mouth 4 (four) times daily as needed.  [provider] Taking Active            Med Note Michaelle Birks, Glean Salvo   Fri Mar 04, 2020 10:58 AM) Prescribed by Farris Has, PA-C  promethazine (PHENERGAN) 25 MG tablet [id number] Yes Take 25 mg by mouth daily as needed. [provider] Taking Active            Med Note Michaelle Birks, Glean Salvo   Fri Mar 04, 2020 10:59 AM) Prescribed by Dr. Manuella Ghazi  rosuvastatin (CRESTOR) 5 MG tablet [id number] Yes Take 1 tablet (5 mg total) by mouth at bedtime. [name], MD Taking Active   SYNJARDY 12.5-500 MG TABS [id number] Yes TAKE 1 TABLET BY MOUTH TWICE DAILY *IN PLACE OF METFORMINAncil Boozer, Drue Stager, MD Taking Active   theophylline (UNIPHYL) 400 MG 24 hr tablet [id number] Yes Take 1 tablet by mouth daily.  Erby Pian, MD Taking Active Self           Med Note Michaelle Birks, Glean Salvo   Fri Mar 04, 2020 10:59 AM) Prescribed by Dr. Vella Kohler  tiotropium [facility]) 18  MCG inhalation capsule [id number] Yes Place 1 capsule into inhaler and inhale daily. pm Erby Pian, MD Taking Active Self           Med Note Michaelle Birks, Cathe Mons A   Fri Mar 04, 2020 11:00 AM) Prescribed by Dr. Vella Kohler  tiZANidine (ZANAFLEX) 4 MG tablet [id number] Yes Take 4 mg by mouth every 6 (six) hours as needed for muscle spasms. [provider] Taking Active   traZODone (DESYREL) 100 MG tablet [id number] Yes Take 1 tablet (100 mg total) by mouth at bedtime. Ursula Alert, MD Taking Active   Ubrogepant (UBRELVY) 100 MG TABS [id number] Yes Take 100 mg by mouth daily as needed. May repeat in 2 hours [name], MD Taking Active            Med Note Michaelle Birks, Glean Salvo   Fri Mar 04, 2020 11:01 AM) Prescribed by Dr. Ronnell Guadalajara HFA 108 [phone] Base) MCG/ACT inhaler [id number] Yes INHALE 1 PUFF BY MOUTH AS NEEDED [name], MD Taking Active  Patient Active Problem List   Diagnosis Date Noted   Esophageal dysphagia    Gastric erythema    Columnar-lined esophagus    Mild protein-calorie malnutrition ([place]) 09/26/2020   Centrilobular emphysema ([place]) 03/30/2020   History of prolonged Q-T interval on ECG 11/18/2019   GAD (generalized anxiety disorder) 10/13/2019   MDD (major depressive disorder), recurrent episode, moderate ([place]) 10/13/2019   At risk for long QT syndrome 10/13/2019   Nonrheumatic mitral valve regurgitation 05/04/2019   Benign neoplasm of descending colon    Polyp of sigmoid colon    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   Polyneuropathy 10/21/2014   Chronic venous insufficiency 10/05/2014   Bilateral leg edema 08/16/2014   Major depression in partial remission ([place]) 08/16/2014   Acid reflux 08/16/2014   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   Asthma, moderate persistent 08/16/2014   Carpal tunnel syndrome 08/16/2014   Cervical pain 08/16/2014   CAFL (chronic airflow limitation) ([place]) 08/16/2014   Type 2 diabetes mellitus with  peripheral neuropathy ([place]) 08/16/2014   Diabetes mellitus type 2, insulin dependent ([place]) 08/16/2014   Dyslipidemia 08/16/2014   Tobacco use disorder 08/16/2014   Essential (primary) hypertension 08/16/2014   Benign neoplasm of stomach 08/16/2014   Gout 08/16/2014   HLD (hyperlipidemia) 08/16/2014   Low back pain 08/16/2014   Lumbar radiculopathy 08/16/2014   Headache, migraine 08/16/2014   Arthralgia of multiple joints 08/16/2014   Avitaminosis D 08/16/2014   Primary osteoarthritis of both knees 06/22/2014   Benign essential tremor 10/02/2013   Cervical dystonia 10/02/2013   Chronic diastolic heart failure ([place]) 11/16/2012   Chronic pain 11/13/2012    Immunization History  Administered Date(s) Administered   Influenza,inj,Quad PF,6+ Mos 01/24/2015, 12/19/2016, 10/29/2017, 10/14/2019, 01/31/2021   Influenza-Unspecified 12/09/2015   PFIZER(Purple Top)SARS-COV-2 Vaccination 05/15/2019, 06/09/2019   Pneumococcal Polysaccharide-23 02/19/2009   Tdap 12/09/2012    Conditions to be addressed/monitored:  Hypertension, Hyperlipidemia, Diabetes, Heart Failure, GERD, COPD, Asthma, Depression, Anxiety, Osteoarthritis, Tobacco use, and Chronic Pain and Parkinson's Disease  There are no care plans that you recently modified to display for this patient.     Medication Assistance: None required.  Patient affirms current coverage meets needs.  Compliance/Adherence/Medication fill history: Care Gaps: Mammogram PAP Smear Pneumonia  Covid Vaccine Ophthalmology Exam Influenza Vaccine  Star-Rating Drugs: Synjardy LF 09/26/20 for 90-DS  Rosuvastatin 5 mg LF 09/26/20 for 90-DS   Patient's preferred pharmacy is:  [facility], [place] [address] Phone: [phone] Fax: [phone]  [facility] ([place], [place] [address] [facility] [address] [place] [address] Phone: [phone]  Fax: [phone]   Uses pill box? Yes Pt endorses 100% compliance  We discussed: Current pharmacy is preferred with insurance plan and patient is satisfied with pharmacy services Patient decided to: Continue current medication management strategy  Care Plan and Follow Up Patient Decision:  Patient agrees to Care Plan and Follow-up.  Plan: Telephone follow up appointment with care management team member scheduled for:  02/15/2021 at 3:00 PM  Malva Limes, [facility] [phone]  Current Barriers:  No barriers noted  Pharmacist Clinical Goal(s):  Patient will maintain control of COPD as evidenced by stable breathing, lack of exacerbations  through collaboration with PharmD and provider.   Interventions: 1:1 collaboration with [name], MD regarding development and update of comprehensive plan of care as evidenced by provider attestation  and co-signature Inter-disciplinary care team collaboration (see longitudinal plan of care) Comprehensive medication review performed; medication list updated in electronic medical record  Heart Failure (Goal: manage symptoms and prevent exacerbations) -Controlled -Last ejection fraction: 60-65% (Date: 2016) -HF type: Diastolic -NYHA Class: I (no actitivty limitation) -AHA HF Stage: B (Heart disease present - no symptoms present) -Current treatment: Furosemide 40 mg daily as needed -Medications previously tried: NA  -Current home BP/HR readings: NA -Recommended to continue current medication  Hyperlipidemia: (LDL goal < 70) -Controlled -Current treatment: Rosuvastatin 5 mg daily at bedtime -Medications previously tried: NA  -Recommended to continue current medication  Diabetes (A1c goal <8%) -Controlled -Current medications: Synjardy 12.5-500 mg twice daily  Lantus 20 units daily (takes sporadically) -Medications previously tried: NA  -Patient never uses Humalog, only  takes Lantus PRN when she feels her blood sugars are too elevated. She is interested in a CGM, but likely would not be covered under medicare.  -Current home glucose readings fasting glucose: Has not been monitoring.  -Denies hypoglycemic/hyperglycemic symptoms -Current meal patterns:  drinks: Increased water intake, 2-3 water glasses. Significantly cut back coffee intake, only 1-2 cups daily -Current exercise: Minimal -Recommended to continue current medication  Asthma/ COPD (Goal: control symptoms and prevent exacerbations) -Not ideally controlled -Current treatment  Symbicort 2 puffs twice daily  DuoNeb  Montelukast 10 mg nightly  Theophylline 400 mg daily  Spiriva 18 mcg daily  Ventolin HFA 1 puff every 8 hours as needed  -Medications previously tried: NA  -Gold Grade: Gold 2 (FEV1 50-79%) -Current COPD Classification:  B (high sx, <2 exacerbations/yr) -CAT ASSESSMENT  Rank each of the following items on a scale of 0 to 5 (with 5 being most severe) Write a # 0-5 in each box  I never cough (0) > I cough all the time (5) 4  I have no phlegm (mucus) in my chest (0) > My chest is completely full of phlegm (mucus) (5) 3  My chest does not feel tight at all (0) > My chest feels very tight (5) 0  When I walk up a hill or one flight of stairs I am not breathless (0) > When I walk up a hill or one flight of stairs I am very breathless (5) 3  I am not limited doing any activities at home (0) > I am very limited doing activities at home (5) 2  I am confident leaving my home despite my lung function (0) > I am not at all confident leaving my home because of my lung condition (5)  4  I sleep soundly (0) > I don't sleep soundly because of my lung condition (5) 3  I have lots of energy (0) > I have no energy at all (5) 2  Total CAT Score: 21 (high)  -Pulmonary function testing: FEV1 58% (2019) -Exacerbations requiring treatment in last 6 months: None -Patient reports consistent use of  maintenance inhaler -Frequency of rescue inhaler use: 1-2 times daily  -Will set up patient for OV with provider for pulmonary assessment due to concern of possible exacerbation  -Recommended to continue current medication  Depression/Anxiety (Goal: Achieve symptom remission) -Controlled -Current treatment: Duloxetine 30 mg daily  Hydroxyzine 50 mg twice daily as needed  Trazodone 100 mg nightly -Medications previously tried/failed: NA -PHQ9: 9 -GAD7: NA -Recommended to continue current medication  Chronic Pain (Goal: Achieve adequate pain control) -Controlled -Current treatment  Morphine MS 30 mg twice daily  Oxycodone 10 mg four times daily as  needed  -Medications previously tried: NA  -Recommended to continue current medication   Patient Goals/Self-Care Activities Patient will:  - check glucose twice daily, before breakfast and at bedtime, document, and provide at future appointments check blood pressure weekly, document, and provide at future appointments  Follow Up Plan: Telephone follow up appointment with care management team member scheduled for:  02/15/2021 at 3:00 PM

## 2021-02-22 ENCOUNTER — Telehealth: Payer: Medicare Other

## 2021-02-22 NOTE — Progress Notes (Deleted)
Chronic Care Management Pharmacy Note  02/22/2021 Name:  Tricia [name] MRN:  [id number] DOB:  [birth year]  Summary: Patient presents for CCM follow-up.   Recommendations/Changes made from today's visit:   Plan: CPP follow-up 3 months    Subjective: Tricia [name] is an 61 y.o. year old female who is a primary patient of [name], MD.  The CCM team was consulted for assistance with disease management and care coordination needs.    Engaged with patient by telephone for follow up visit in response to provider referral for pharmacy case management and/or care coordination services.   Consent to Services:  The patient was given information about Chronic Care Management services, agreed to services, and gave verbal consent prior to initiation of services.  Please see initial visit note for detailed documentation.   Patient Care Team: [name], MD as PCP - General (Family Medicine) Erby Pian, MD as Consulting Physician (Pulmonary Disease) [name], MD as Consulting Physician (Neurology) Ubaldo Glassing Javier Docker, MD as Consulting Physician (Cardiology) Germaine Pomfret, [facility] (Pharmacist) Zorita Pang, [place] as Physician Assistant (Pain Medicine) Virgel Manifold, MD as Consulting Physician (Gastroenterology)  Recent office visits: 01/31/21: Patient presented to Dr. [name] for follow-up. Synjardy 12.5-500 mg twice daily 01/31/21: Patient presented to Clemetine Marker, LPN for AWV. 02/28/48: Patient presented to Dr. [name] for follow-up. Metformin stopped, Synjardy started.   Recent consult visits: 07/05/20: Patient presented to Dr. Raul Del (Pulmonology) for follow-up.   Hospital visits: None in previous 6 months   Objective:  Lab Results  Component Value Date   CREATININE 0.72 09/26/2020   BUN 14 09/26/2020   GFRNONAA 76 08/05/2019   GFRAA 88 08/05/2019   NA 140 09/26/2020   K 4.3 09/26/2020   CALCIUM 9.0 09/26/2020   CO2 30 09/26/2020    GLUCOSE 155 (H) 09/26/2020    Lab Results  Component Value Date/Time   HGBA1C 6.1 (A) 01/31/2021 02:28 PM   HGBA1C 7.0 (A) 09/26/2020 03:07 PM   HGBA1C 6.8 (H) 08/05/2019 03:04 PM   HGBA1C 6.7 (H) 08/27/2018 02:01 PM   HGBA1C 13.7 (A) 03/19/2018 02:04 PM   HGBA1C 14.0 (A) 10/29/2017 02:18 PM   HGBA1C 7.4 (H) 10/09/2012 04:52 AM   HGBA1C 7.1 (H) 07/02/2012 05:33 AM   MICROALBUR 46.0 09/26/2020 04:05 PM   MICROALBUR 1.2 08/05/2019 03:04 PM   MICROALBUR 50 03/19/2018 02:07 PM   MICROALBUR 100 01/24/2015 12:22 PM    Last diabetic Eye exam:  Lab Results  Component Value Date/Time   HMDIABEYEEXA No Retinopathy 02/27/2019 12:00 AM    Last diabetic Foot exam: No results found for: HMDIABFOOTEX   Lab Results  Component Value Date   CHOL 85 09/26/2020   HDL 31 (L) 09/26/2020   LDLCALC 37 09/26/2020   TRIG 88 09/26/2020   CHOLHDL 2.7 09/26/2020    Hepatic Function Latest Ref Rng & Units 09/26/2020 08/05/2019 08/27/2018  Total Protein 6.1 - 8.1 g/dL 6.4 6.5 7.0  Albumin 3.5 - 5.0 g/dL - - 4.1  AST 10 - 35 U/L 22 7(L) 18  ALT 6 - 29 U/L 25 8 12   Alk Phosphatase 38 - 126 U/L - - 77  Total Bilirubin 0.2 - 1.2 mg/dL 0.5 0.3 0.3    Lab Results  Component Value Date/Time   TSH 0.31 (L) 08/05/2019 03:04 PM   TSH 1.380 10/04/2014 04:20 PM    CBC Latest Ref Rng & Units 09/26/2020 08/05/2019 08/27/2018  WBC 3.8 - 10.8 Thousand/uL 6.5  8.3 6.3  Hemoglobin 11.7 - 15.5 g/dL 14.6 14.2 14.1  Hematocrit 35.0 - 45.0 % 45.4(H) 43.1 42.2  Platelets 140 - 400 Thousand/uL 282 206 235    Lab Results  Component Value Date/Time   VD25OH 21 (L) 09/26/2020 04:05 PM   VD25OH 17 (L) 08/05/2019 03:04 PM    Clinical ASCVD: No  The ASCVD Risk score (Arnett DK, et al., 2019) failed to calculate for the following reasons:   The valid total cholesterol range is 130 to 320 mg/dL    Depression screen [facility] 2/9 01/31/2021 11/03/2020 09/26/2020  Decreased Interest 3 3 1   Down, Depressed, Hopeless 3 3 3   PHQ -  2 Score 6 6 4   Altered sleeping 3 3 1   Tired, decreased energy 3 3 3   Change in appetite 3 0 1  Feeling bad or failure about yourself  0 1 0  Trouble concentrating 2 0 0  Moving slowly or fidgety/restless 2 0 0  Suicidal thoughts 0 0 0  PHQ-9 Score 19 13 9   Difficult doing work/chores Not difficult at all - -  Some recent data might be hidden    Social History   Tobacco Use  Smoking Status Every Day   Packs/day: 1.00   Years: 41.00   Pack years: 41.00   Types: Cigarettes   Start date: 05/19/1977  Smokeless Tobacco Never   BP Readings from Last 3 Encounters:  01/31/21 112/66  01/31/21 112/66  01/10/21 101/72   Pulse Readings from Last 3 Encounters:  01/31/21 96  01/31/21 (!) 106  01/10/21 83   Wt Readings from Last 3 Encounters:  01/31/21 157 lb (71.2 kg)  01/31/21 157 lb 14.4 oz (71.6 kg)  01/10/21 156 lb (70.8 kg)   BMI Readings from Last 3 Encounters:  01/31/21 23.18 kg/m  01/31/21 23.32 kg/m  01/10/21 23.04 kg/m    Assessment/Interventions: Review of patient past medical history, allergies, medications, health status, including review of consultants reports, laboratory and other test data, was performed as part of comprehensive evaluation and provision of chronic care management services.   SDOH:  (Social Determinants of Health) assessments and interventions performed: Yes   SDOH Screenings   Alcohol Screen: Low Risk    Last Alcohol Screening Score (AUDIT): 0  Depression (PHQ2-9): Medium Risk   PHQ-2 Score: 19  Financial Resource Strain: Low Risk    Difficulty of Paying Living Expenses: Not very hard  Food Insecurity: No Food Insecurity   Worried About Charity fundraiser in the Last Year: Never true   Ran Out of Food in the Last Year: Never true  Housing: Low Risk    Last Housing Risk Score: 0  Physical Activity: Inactive   Days of Exercise per Week: 0 days   Minutes of Exercise per Session: 0 min  Social Connections: Moderately Isolated    Frequency of Communication with Friends and Family: More than three times a week   Frequency of Social Gatherings with Friends and Family: Once a week   Attends Religious Services: Never   Marine scientist or Organizations: Not on file   Attends Archivist Meetings: Never   Marital Status: Living with partner  Stress: No Stress Concern Present   Feeling of Stress : Only a little  Tobacco Use: High Risk   Smoking Tobacco Use: Every Day   Smokeless Tobacco Use: Never   Passive Exposure: Not on file  Transportation Needs: No Transportation Needs   Lack of Transportation (Medical): No  Lack of Transportation (Non-Medical): No    CCM Care Plan  Allergies  Allergen Reactions   Augmentin [Amoxicillin-Pot Clavulanate] Diarrhea   Penicillins Itching    Medications Reviewed Today     Reviewed by Carlene Coria, CMA (Certified Medical Assistant) on 01/31/21 at 1422  Med List Status: <None>   Medication Order Taking? Sig Documenting Provider Last Dose Status Informant  blood glucose meter kit and supplies KIT [id number] Yes Dispense accuchek aviva plus; can change if needed e11.9 [name], MD Taking Active   budesonide-formoterol [facility]) 160-4.5 MCG/ACT inhaler [id number] Yes Inhale 2 puffs into the lungs in the morning and at bedtime. Erby Pian, MD Taking Active            Med Note Michaelle Birks, Cathe Mons A   Fri Mar 04, 2020 10:56 AM) Prescribed by Dr. Vella Kohler  busPIRone (BUSPAR) 7.5 MG tablet [id number] Yes Take 7.5 mg by mouth 2 (two) times daily. [provider] Taking Active   carbidopa-levodopa (SINEMET IR) 25-100 MG tablet [id number]  Take 1.5 tablets by mouth 3 (three) times daily. [name], MD  Expired 01/23/21 2359            Med Note Michaelle Birks, Shylee Durrett A   Fri Mar 04, 2020 10:54 AM) Prescribed by Dr. Manuella Ghazi  cetirizine (ZYRTEC) 10 MG tablet [id number] Yes Take 10 mg by mouth daily as needed for allergies. [provider] Taking  Active Self  colchicine 0.6 MG tablet [id number] Yes TAKE 1 TABLET BY MOUTH ONCE DAILY Sowles, Drue Stager, MD Taking Active   DULoxetine (CYMBALTA) 30 MG capsule [id number] Yes TAKE 1 CAPSULE BY MOUTH ONCE DAILY. *NEEDS APPOINTMENT FOR FURTHER FILLS* Orlene Erm, MD Taking Active   furosemide (LASIX) 40 MG tablet [id number] Yes Take 1 tablet (40 mg total) by mouth daily as needed. [name], NP Taking Active Self           Med Note Michaelle Birks, Cathe Mons A   Fri Mar 04, 2020 10:38 AM)    gabapentin (NEURONTIN) 600 MG tablet [id number] Yes Take 1 tablet (600 mg total) by mouth 3 (three) times daily. [name], NP Taking Active Self           Med Note Michaelle Birks, Glean Salvo   Fri Mar 04, 2020 10:54 AM) Prescribed by Dr. Manuella Ghazi  glucose blood test strip [id number] Yes Use as directed to check blood glucose daily [name], MD Taking Active   hydrOXYzine (ATARAX/VISTARIL) 25 MG tablet [id number] Yes Take 25 mg by mouth 2 (two) times daily. [provider] Taking Active   insulin glargine (LANTUS SOLOSTAR) 100 UNIT/ML Solostar Pen [id number] Yes Inject 20 Units into the skin daily. [name], MD Taking Active            Med Note Michaelle Birks, Cathe Mons A   Thu Mar 24, 2020 10:29 AM) Only taking PRN in the evenings if her sugars feel elevated  Insulin Pen Needle 32G X 6 MM MISC [id number] Yes 2 each by Does not apply route daily. [name], MD Taking Active   ipratropium-albuterol (DUONEB) 0.5-2.5 (3) MG/3ML SOLN [id number] Yes Inhale 3 mLs into the lungs every 6 (six) hours as needed. [name], NP Taking Active Self  lidocaine (LIDODERM) 5 % [id number] Yes 1 patch every 12 (twelve) hours as needed. [provider] Taking Active            Med Note Michaelle Birks, Glean Salvo   Fri Mar 04, 2020 10:58 AM) Prescribed by Arbie Cookey  Henson, PA-C  lubiprostone (AMITIZA) 24 MCG capsule [id number] Yes Take by mouth 2 (two) times daily as needed. [provider]  Taking Active            Med Note Michaelle Birks, Glean Salvo   Fri Mar 04, 2020 10:58 AM) Prescribed by Farris Has, PA-C  montelukast (SINGULAIR) 10 MG tablet [id number] Yes TAKE 1 TABLET BY MOUTH  DAILY IN THE AFTERNOON [name], MD Taking Active   morphine (MS CONTIN) 30 MG 12 hr tablet [id number] Yes Take 30 mg by mouth every 12 (twelve) hours. [provider] Taking Active            Med Note Michaelle Birks, Glean Salvo   Fri Mar 04, 2020 10:58 AM) Prescribed by Farris Has, PA-C  naproxen sodium (ALEVE) 220 MG tablet [id number] Yes Take 220 mg by mouth. PRN [provider] Taking Active   omeprazole (PRILOSEC) 40 MG capsule [id number] Yes Take 1 capsule (40 mg total) by mouth daily. [name], MD Taking Active   ondansetron (ZOFRAN ODT) 4 MG disintegrating tablet [id number] Yes Take 1 tablet (4 mg total) by mouth every 8 (eight) hours as needed for nausea or vomiting. [name], MD Taking Active   Oxycodone HCl 10 MG TABS [id number] Yes Take 10 mg by mouth 4 (four) times daily as needed.  [provider] Taking Active            Med Note Michaelle Birks, Glean Salvo   Fri Mar 04, 2020 10:58 AM) Prescribed by Farris Has, PA-C  promethazine (PHENERGAN) 25 MG tablet [id number] Yes Take 25 mg by mouth daily as needed. [provider] Taking Active            Med Note Michaelle Birks, Glean Salvo   Fri Mar 04, 2020 10:59 AM) Prescribed by Dr. Manuella Ghazi  rosuvastatin (CRESTOR) 5 MG tablet [id number] Yes Take 1 tablet (5 mg total) by mouth at bedtime. [name], MD Taking Active   SYNJARDY 12.5-500 MG TABS [id number] Yes TAKE 1 TABLET BY MOUTH TWICE DAILY *IN PLACE OF METFORMINAncil Boozer, Drue Stager, MD Taking Active   theophylline (UNIPHYL) 400 MG 24 hr tablet [id number] Yes Take 1 tablet by mouth daily.  Erby Pian, MD Taking Active Self           Med Note Michaelle Birks, Glean Salvo   Fri Mar 04, 2020 10:59 AM) Prescribed by Dr. Vella Kohler  tiotropium [facility]) 18 MCG inhalation  capsule [id number] Yes Place 1 capsule into inhaler and inhale daily. pm Erby Pian, MD Taking Active Self           Med Note Michaelle Birks, Cathe Mons A   Fri Mar 04, 2020 11:00 AM) Prescribed by Dr. Vella Kohler  tiZANidine (ZANAFLEX) 4 MG tablet [id number] Yes Take 4 mg by mouth every 6 (six) hours as needed for muscle spasms. [provider] Taking Active   traZODone (DESYREL) 100 MG tablet [id number] Yes Take 1 tablet (100 mg total) by mouth at bedtime. Ursula Alert, MD Taking Active   Ubrogepant (UBRELVY) 100 MG TABS [id number] Yes Take 100 mg by mouth daily as needed. May repeat in 2 hours [name], MD Taking Active            Med Note Michaelle Birks, Glean Salvo   Fri Mar 04, 2020 11:01 AM) Prescribed by Dr. Ronnell Guadalajara HFA [phone] Base) MCG/ACT inhaler [id number] Yes INHALE 1 PUFF BY MOUTH AS NEEDED [name], MD Taking Active  Patient Active Problem List   Diagnosis Date Noted   Esophageal dysphagia    Gastric erythema    Columnar-lined esophagus    Mild protein-calorie malnutrition ([place]) 09/26/2020   Centrilobular emphysema ([place]) 03/30/2020   History of prolonged Q-T interval on ECG 11/18/2019   GAD (generalized anxiety disorder) 10/13/2019   MDD (major depressive disorder), recurrent episode, moderate ([place]) 10/13/2019   At risk for long QT syndrome 10/13/2019   Nonrheumatic mitral valve regurgitation 05/04/2019   Benign neoplasm of descending colon    Polyp of sigmoid colon    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   Polyneuropathy 10/21/2014   Chronic venous insufficiency 10/05/2014   Bilateral leg edema 08/16/2014   Major depression in partial remission ([place]) 08/16/2014   Acid reflux 08/16/2014   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   Asthma, moderate persistent 08/16/2014   Carpal tunnel syndrome 08/16/2014   Cervical pain 08/16/2014   CAFL (chronic airflow limitation) ([place]) 08/16/2014   Type 2 diabetes mellitus with peripheral  neuropathy ([place]) 08/16/2014   Diabetes mellitus type 2, insulin dependent ([place]) 08/16/2014   Dyslipidemia 08/16/2014   Tobacco use disorder 08/16/2014   Essential (primary) hypertension 08/16/2014   Benign neoplasm of stomach 08/16/2014   Gout 08/16/2014   HLD (hyperlipidemia) 08/16/2014   Low back pain 08/16/2014   Lumbar radiculopathy 08/16/2014   Headache, migraine 08/16/2014   Arthralgia of multiple joints 08/16/2014   Avitaminosis D 08/16/2014   Primary osteoarthritis of both knees 06/22/2014   Benign essential tremor 10/02/2013   Cervical dystonia 10/02/2013   Chronic diastolic heart failure ([place]) 11/16/2012   Chronic pain 11/13/2012    Immunization History  Administered Date(s) Administered   Influenza,inj,Quad PF,6+ Mos 01/24/2015, 12/19/2016, 10/29/2017, 10/14/2019, 01/31/2021   Influenza-Unspecified 12/09/2015   PFIZER(Purple Top)SARS-COV-2 Vaccination 05/15/2019, 06/09/2019   Pneumococcal Polysaccharide-23 02/19/2009   Tdap 12/09/2012    Conditions to be addressed/monitored:  Hypertension, Hyperlipidemia, Diabetes, Heart Failure, GERD, COPD, Asthma, Depression, Anxiety, Osteoarthritis, Tobacco use, and Chronic Pain and Parkinson's Disease  There are no care plans that you recently modified to display for this patient.     Medication Assistance: None required.  Patient affirms current coverage meets needs.  Compliance/Adherence/Medication fill history: Care Gaps: Mammogram PAP Smear Pneumonia  Covid Vaccine Ophthalmology Exam Influenza Vaccine  Star-Rating Drugs: Synjardy LF 09/26/20 for 90-DS  Rosuvastatin 5 mg LF 09/26/20 for 90-DS   Patient's preferred pharmacy is:  [place], [place] [address] Phone: [phone] Fax: [phone]  [facility] ([place], [facility] [address] [place] [address] Phone: [phone] Fax:  [phone]   Uses pill box? Yes Pt endorses 100% compliance  We discussed: Current pharmacy is preferred with insurance plan and patient is satisfied with pharmacy services Patient decided to: Continue current medication management strategy  Care Plan and Follow Up Patient Decision:  Patient agrees to Care Plan and Follow-up.  Plan: Telephone follow up appointment with care management team member scheduled for:  02/15/2021 at 3:00 PM  Malva Limes, [facility] [phone]  Current Barriers:  No barriers noted  Pharmacist Clinical Goal(s):  Patient will maintain control of COPD as evidenced by stable breathing, lack of exacerbations  through collaboration with PharmD and provider.   Interventions: 1:1 collaboration with [name], MD regarding development and update of comprehensive plan of care as evidenced by provider attestation  and co-signature Inter-disciplinary care team collaboration (see longitudinal plan of care) Comprehensive medication review performed; medication list updated in electronic medical record  Heart Failure (Goal: manage symptoms and prevent exacerbations) -Controlled -Last ejection fraction: 60-65% (Date: 2016) -HF type: Diastolic -NYHA Class: I (no actitivty limitation) -AHA HF Stage: B (Heart disease present - no symptoms present) -Current treatment: Furosemide 40 mg daily as needed -Medications previously tried: NA  -Current home BP/HR readings: NA -Recommended to continue current medication  Hyperlipidemia: (LDL goal < 70) -Controlled -Current treatment: Rosuvastatin 5 mg daily at bedtime -Medications previously tried: NA  -Recommended to continue current medication  Diabetes (A1c goal <8%) -Controlled -Current medications: Synjardy 12.5-500 mg twice daily  Lantus 20 units daily (takes sporadically) -Medications previously tried: NA  -Patient never uses Humalog, only takes  Lantus PRN when she feels her blood sugars are too elevated. She is interested in a CGM, but likely would not be covered under medicare.  -Current home glucose readings fasting glucose: Has not been monitoring.  -Denies hypoglycemic/hyperglycemic symptoms -Current meal patterns:  drinks: Increased water intake, 2-3 water glasses. Significantly cut back coffee intake, only 1-2 cups daily -Current exercise: Minimal -Recommended to continue current medication  Asthma/ COPD (Goal: control symptoms and prevent exacerbations) -Not ideally controlled -Current treatment  Symbicort 2 puffs twice daily  DuoNeb  Montelukast 10 mg nightly  Theophylline 400 mg daily  Spiriva 18 mcg daily  Ventolin HFA 1 puff every 8 hours as needed  -Medications previously tried: NA  -Gold Grade: Gold 2 (FEV1 50-79%) -Current COPD Classification:  B (high sx, <2 exacerbations/yr) -CAT ASSESSMENT  Rank each of the following items on a scale of 0 to 5 (with 5 being most severe) Write a # 0-5 in each box  I never cough (0) > I cough all the time (5) 4  I have no phlegm (mucus) in my chest (0) > My chest is completely full of phlegm (mucus) (5) 3  My chest does not feel tight at all (0) > My chest feels very tight (5) 0  When I walk up a hill or one flight of stairs I am not breathless (0) > When I walk up a hill or one flight of stairs I am very breathless (5) 3  I am not limited doing any activities at home (0) > I am very limited doing activities at home (5) 2  I am confident leaving my home despite my lung function (0) > I am not at all confident leaving my home because of my lung condition (5)  4  I sleep soundly (0) > I don't sleep soundly because of my lung condition (5) 3  I have lots of energy (0) > I have no energy at all (5) 2  Total CAT Score: 21 (high)  -Pulmonary function testing: FEV1 58% (2019) -Exacerbations requiring treatment in last 6 months: None -Patient reports consistent use of maintenance  inhaler -Frequency of rescue inhaler use: 1-2 times daily  -Will set up patient for OV with provider for pulmonary assessment due to concern of possible exacerbation  -Recommended to continue current medication  Depression/Anxiety (Goal: Achieve symptom remission) -Controlled -Current treatment: Duloxetine 30 mg daily  Hydroxyzine 50 mg twice daily as needed  Trazodone 100 mg nightly -Medications previously tried/failed: NA -PHQ9: 9 -GAD7: NA -Recommended to continue current medication  Chronic Pain (Goal: Achieve adequate pain control) -Controlled -Current treatment  Morphine MS 30 mg twice daily  Oxycodone 10 mg four times daily as  needed  -Medications previously tried: NA  -Recommended to continue current medication  Patient Goals/Self-Care Activities Patient will:  - check glucose twice daily, before breakfast and at bedtime, document, and provide at future appointments check blood pressure weekly, document, and provide at future appointments  Follow Up Plan: ***

## 2021-03-05 ENCOUNTER — Other Ambulatory Visit: Payer: Self-pay | Admitting: *Deleted

## 2021-03-05 DIAGNOSIS — F17210 Nicotine dependence, cigarettes, uncomplicated: Secondary | ICD-10-CM

## 2021-03-05 DIAGNOSIS — Z87891 Personal history of nicotine dependence: Secondary | ICD-10-CM

## 2021-03-22 ENCOUNTER — Other Ambulatory Visit: Payer: Self-pay | Admitting: Family Medicine

## 2021-03-22 DIAGNOSIS — G4700 Insomnia, unspecified: Secondary | ICD-10-CM

## 2021-03-23 NOTE — Telephone Encounter (Signed)
Dose inconsistent with current med list, please assess. Requested Prescriptions  Pending Prescriptions Disp Refills   traZODone (DESYREL) 50 MG tablet [Pharmacy Med Name: traZODone HCl 50 MG Oral Tablet] 90 tablet 3    Sig: TAKE 1 TABLET BY MOUTH AT  BEDTIME     Psychiatry: Antidepressants - Serotonin Modulator Passed - 03/22/2021 10:15 PM      Passed - Completed PHQ-2 or PHQ-9 in the last 360 days      Passed - Valid encounter within last 6 months    Recent Outpatient Visits          1 month ago Type 2 diabetes mellitus with peripheral neuropathy [facility])   [facility] [name], MD   4 months ago Chronic nausea   [facility]ight, Drue Stager, MD   5 months ago Type 2 diabetes mellitus with peripheral neuropathy [facility])   [facility] [name], MD   11 months ago Abnormal CT of the chest   [facility] [name], MD   1 year ago Type 2 diabetes mellitus with peripheral neuropathy [facility])   [facility] [name], MD      Future Appointments            In 2 months [name], Drue Stager, MD [facility] [place]   In 10 months  [facility], [facility]

## 2021-04-14 ENCOUNTER — Encounter: Payer: Self-pay | Admitting: Family Medicine

## 2021-04-26 ENCOUNTER — Telehealth: Payer: Self-pay

## 2021-04-26 NOTE — Progress Notes (Unsigned)
Chronic Care Management Pharmacy Assistant   Name: Tricia [name]  MRN: [id number] DOB: April 29, 1960  Reason for Encounter: COPD Disease State   Recent office visits:  01/31/2022 [name], MD (PCP Office Visit) for Follow-up- No Medication changes noted, lab order placed, Mammogram order placed, No follow-up noted  01/31/2021 Clemetine Marker, LPN (Clinical Support) for Medicare Wellness Exam- Stopped: Cyclobenzaprine 10 mg due to patient reported not taking, Referral for Lung Screening ordered  Recent consult visits:  12/13/2020 [name], MD (Gastroenterology) for Initial Visit) No medication changes noted, EGD Order placed, No follow-up noted  Hospital visits:  Medication Reconciliation was completed by comparing discharge summary, patients EMR and Pharmacy list, and upon discussion with patient.  Admitted to the hospital on 01/10/2021 due to For EGD. Discharge date was 01/10/2021. Discharged from [facility].    New?Medications Started at [facility] Discharge:?? -Started None ID  Medication Changes at Hospital Discharge: -Changed None ID  Medications Discontinued at Hospital Discharge: -Stopped None ID  Medications that remain the same after Hospital Discharge:??  -All other medications will remain the same.    Medications: Outpatient Encounter Medications as of 04/26/2021  Medication Sig Note   blood glucose meter kit and supplies KIT Dispense accuchek aviva plus; can change if needed e11.9    budesonide-formoterol (SYMBICORT) 160-4.5 MCG/ACT inhaler Inhale 2 puffs into the lungs in the morning and at bedtime. 03/04/2020: Prescribed by Dr. Vella Kohler   busPIRone (BUSPAR) 7.5 MG tablet Take 7.5 mg by mouth 2 (two) times daily.    carbidopa-levodopa (SINEMET IR) 25-100 MG tablet Take 1.5 tablets by mouth 3 (three) times daily. 03/04/2020: Prescribed by Dr. Manuella Ghazi   cetirizine (ZYRTEC) 10 MG tablet Take 10 mg by mouth daily as needed for allergies.     colchicine 0.6 MG tablet TAKE 1 TABLET BY MOUTH ONCE DAILY    DULoxetine (CYMBALTA) 30 MG capsule TAKE 1 CAPSULE BY MOUTH ONCE DAILY. *NEEDS APPOINTMENT FOR FURTHER FILLS*    Empagliflozin-metFORMIN HCl (SYNJARDY) 12.5-500 MG TABS Take 1 tablet by mouth 2 (two) times daily.    furosemide (LASIX) 40 MG tablet Take 1 tablet (40 mg total) by mouth daily as needed.    gabapentin (NEURONTIN) 600 MG tablet Take 1 tablet (600 mg total) by mouth 3 (three) times daily. 03/04/2020: Prescribed by Dr. Manuella Ghazi   glucose blood test strip Use as directed to check blood glucose daily    hydrOXYzine (ATARAX/VISTARIL) 25 MG tablet Take 25 mg by mouth 2 (two) times daily.    insulin glargine (LANTUS SOLOSTAR) 100 UNIT/ML Solostar Pen Inject 20 Units into the skin daily. 03/24/2020: Only taking PRN in the evenings if her sugars feel elevated   Insulin Pen Needle 32G X 6 MM MISC 2 each by Does not apply route daily.    ipratropium-albuterol (DUONEB) 0.5-2.5 (3) MG/3ML SOLN Inhale 3 mLs into the lungs every 6 (six) hours as needed.    lidocaine (LIDODERM) 5 % 1 patch every 12 (twelve) hours as needed. 03/04/2020: Prescribed by Farris Has, PA-C   lubiprostone (AMITIZA) 24 MCG capsule Take by mouth 2 (two) times daily as needed. 03/04/2020: Prescribed by Farris Has, PA-C   montelukast (SINGULAIR) 10 MG tablet TAKE 1 TABLET BY MOUTH  DAILY IN THE AFTERNOON    morphine (MS CONTIN) 30 MG 12 hr tablet Take 30 mg by mouth every 12 (twelve) hours. 03/04/2020: Prescribed by Farris Has, PA-C   naproxen sodium (ALEVE) 220 MG tablet Take 220 mg by mouth. PRN  omeprazole (PRILOSEC) 40 MG capsule Take 1 capsule (40 mg total) by mouth daily.    ondansetron (ZOFRAN ODT) 4 MG disintegrating tablet Take 1 tablet (4 mg total) by mouth every 8 (eight) hours as needed for nausea or vomiting.    Oxycodone HCl 10 MG TABS Take 10 mg by mouth 4 (four) times daily as needed.  03/04/2020: Prescribed by Farris Has, PA-C   promethazine (PHENERGAN)  25 MG tablet Take 25 mg by mouth daily as needed. 03/04/2020: Prescribed by Dr. Manuella Ghazi   rosuvastatin (CRESTOR) 5 MG tablet Take 1 tablet (5 mg total) by mouth at bedtime.    theophylline (UNIPHYL) 400 MG 24 hr tablet Take 1 tablet by mouth daily.  03/04/2020: Prescribed by Dr. Vella Kohler   tiotropium (SPIRIVA) 18 MCG inhalation capsule Place 1 capsule into inhaler and inhale daily. pm 03/04/2020: Prescribed by Dr. Vella Kohler   tiZANidine (ZANAFLEX) 4 MG tablet Take 4 mg by mouth every 6 (six) hours as needed for muscle spasms.    traZODone (DESYREL) 100 MG tablet Take 1 tablet (100 mg total) by mouth at bedtime.    Ubrogepant (UBRELVY) 100 MG TABS Take 100 mg by mouth daily as needed. May repeat in 2 hours 03/04/2020: Prescribed by Dr. Ronnell Guadalajara HFA 108 (90 Base) MCG/ACT inhaler INHALE 1 PUFF BY MOUTH AS NEEDED    No facility-administered encounter medications on file as of 04/26/2021.   Care Gaps: Zoster Vaccine PAP Smear COVID-19 Vaccine Booster 3 Diabetic Eye Exam Mammogram  Star Rating Drugs: Synjardy 12.5- 500 mg last filled on 02/27/2021 for a 90-Day supply with [facility] Rosuvastatin 5 mg last filled on 02/27/2021 for a 90-Day supply with [facility]  Current COPD regimen:  Ventolin HFA 1 puff prn Duoneb 0.5-2.5 mg Inhale 3 mLs into the lungs q6h prn Spiriva 18 mcg 1 capsule into the inhaler daily  No flowsheet data found. Any recent hospitalizations or ED visits since last visit with CPP? Yes Reports***denies COPD symptoms, including {COPD Symptoms:24140} What recent interventions/DTPs have been made by any provider to improve breathing since last visit: Have you had exacerbation/flare-up since last visit? {yes/no:20286} What do you do when you are short of breath?  {SOBresposne:24139}***  Respiratory Devices/Equipment Do you have a nebulizer? {yes/no:20286} Do you use a Peak Flow Meter? {yes/no:20286} Do you use a maintenance inhaler? {yes/no:20286} How often do you forget  to use your daily inhaler? *** Do you use a rescue inhaler? {yes/no:20286} How often do you use your rescue inhaler?  {CHL [employer] Inhaler TYOM:[id number]} Do you use a spacer with your inhaler? {yes/no:20286}  Adherence Review: Does the patient have >5 day gap between last estimated fill date for maintenance inhaler medications? {yes/no:20286}  Patient needs follow-up with CPP  03/08  Left HIPAA compliant VM requesting patient to return my call 03/13  Left HIPAA compliant VM requesting patient to return my call  Lynann Bologna, [place] Pharmacist Assistant Phone: [phone]

## 2021-05-08 ENCOUNTER — Telehealth (INDEPENDENT_AMBULATORY_CARE_PROVIDER_SITE_OTHER): Payer: Medicare Other | Admitting: Family Medicine

## 2021-05-08 ENCOUNTER — Other Ambulatory Visit: Payer: Self-pay

## 2021-05-08 ENCOUNTER — Ambulatory Visit: Payer: Self-pay

## 2021-05-08 DIAGNOSIS — E119 Type 2 diabetes mellitus without complications: Secondary | ICD-10-CM

## 2021-05-08 DIAGNOSIS — Z87898 Personal history of other specified conditions: Secondary | ICD-10-CM

## 2021-05-08 DIAGNOSIS — F3341 Major depressive disorder, recurrent, in partial remission: Secondary | ICD-10-CM | POA: Diagnosis not present

## 2021-05-08 DIAGNOSIS — J432 Centrilobular emphysema: Secondary | ICD-10-CM

## 2021-05-08 DIAGNOSIS — F411 Generalized anxiety disorder: Secondary | ICD-10-CM

## 2021-05-08 DIAGNOSIS — J441 Chronic obstructive pulmonary disease with (acute) exacerbation: Secondary | ICD-10-CM

## 2021-05-08 DIAGNOSIS — F331 Major depressive disorder, recurrent, moderate: Secondary | ICD-10-CM | POA: Diagnosis not present

## 2021-05-08 MED ORDER — LANTUS SOLOSTAR 100 UNIT/ML ~~LOC~~ SOPN: 20.0000 [IU] | PEN_INJECTOR | Freq: Every day | SUBCUTANEOUS | 0 refills | Status: DC

## 2021-05-08 MED ORDER — DOXYCYCLINE HYCLATE 100 MG PO TABS: 100.0000 mg | ORAL_TABLET | Freq: Two times a day (BID) | ORAL | 0 refills | Status: AC

## 2021-05-08 MED ORDER — DULOXETINE HCL 30 MG PO CPEP: ORAL_CAPSULE | ORAL | 0 refills | Status: DC

## 2021-05-08 MED ORDER — INSULIN PEN NEEDLE 32G X 6 MM MISC: 1.0000 | Freq: Every day | 2 refills | Status: DC

## 2021-05-08 MED ORDER — PREDNISONE 20 MG PO TABS: 40.0000 mg | ORAL_TABLET | Freq: Every day | ORAL | 0 refills | Status: AC

## 2021-05-08 NOTE — Assessment & Plan Note (Signed)
With current exacerbation due to death of boyfriend. Restart cymbalta. Make appt with Psych soon, due for follow up. ?

## 2021-05-08 NOTE — Telephone Encounter (Signed)
?  Chief Complaint: bronchitis ?Symptoms: cough, congestion, green thick mucus, scratchy throat, runny nose ?Frequency: 3 weeks  ?Pertinent Negatives: Patient denies fever ?Disposition: '[]'$ ED /'[]'$ Urgent Care (no appt availability in office) / '[x]'$ Appointment(In office/virtual)/ '[]'$  [place] [facility]/ '[]'$ Home Care/ '[]'$ Refused Recommended Disposition /'[]'$  Mobile Bus/ '[]'$  Follow-up with PCP ?Additional Notes: Pt states her BF was in and out the hospital and came home for end of life care and has passed away and pt states she isnt feeling any better. She has been using cough syrup to manage cough but only lasts short period. Pt was also needing refill on Lantus and pen needles. I advised her I would get refill started .  ? ?Message from Rayann Heman sent at 05/08/2021 10:19 AM EDT ? ?Pt called and stated that she need a refill on insulin pen but not sure of the name. Please advise.   ? ? ?Reason for Disposition ? Cough has been present for > 3 weeks ? ?Answer Assessment - Initial Assessment Questions ?1. ONSET: "When did the cough begin?"  ?    3 weeks  ?3. SPUTUM: "Describe the color of your sputum" (none, dry cough; clear, white, yellow, green) ?    Green thick ?5. DIFFICULTY BREATHING: "Are you having difficulty breathing?" If Yes, ask: "How bad is it?" (e.g., mild, moderate, severe)  ?  - MILD: No SOB at rest, mild SOB with walking, speaks normally in sentences, can lie down, no retractions, pulse < 100.  ?  - MODERATE: SOB at rest, SOB with minimal exertion and prefers to sit, cannot lie down flat, speaks in phrases, mild retractions, audible wheezing, pulse 100-120.  ?  - SEVERE: Very SOB at rest, speaks in single words, struggling to breathe, sitting hunched forward, retractions, pulse > 120  ?    COPD SOB ?6. FEVER: "Do you have a fever?" If Yes, ask: "What is your temperature, how was it measured, and when did it start?" ?    No ?10. OTHER SYMPTOMS: "Do you have any other symptoms?" (e.g., runny  nose, wheezing, chest pain) ?      Runny nose, cough, scratchy throat ? ?Protocols used: Cough - Acute Productive-A-AH ? ?

## 2021-05-08 NOTE — Progress Notes (Signed)
Virtual Visit via Video Note ? ?I connected with Tricia [name] on 05/08/21 at  1:00 PM EDT by a video enabled telemedicine application and verified that I am speaking with the correct person using two identifiers. ? ?Location: ?Patient: home ?Provider: [facility] ?  ?I discussed the limitations of evaluation and management by telemedicine and the availability of in person appointments. The patient expressed understanding and agreed to proceed. ? ?History of Present Illness: ? ?UPPER RESPIRATORY TRACT INFECTION ?- h/o asthma, emphysema, tobacco use. On spiriva, symbicort, montelukast, theophylline. Reports compliance.  ?- previously seen by Dr. Raul Del, last appt 06/2020, supposed to follow up in 10/2020. ?- reports worsened dyspnea, productive cough with change in sputum x3 weeks with onset of URI. ?- has not tested for COVID.  ?- nebulizer 1-3 times per day.  ? ?Fever: no. Some chills ?Cough: yes, productive ?Shortness of breath: yes ?Chest pain: no ?Chest tightness: yes ?Chest congestion: yes ?Nasal congestion: no ?Post nasal drip: yes ?Sinus pressure: yes ?Vomiting: no ?Sick contacts:  daughter with cold symptoms  x1 month ago ?Context: worse ?Relief with OTC cold/cough medications: no  ?Treatments attempted: nyquil ? ?Depression - worsened since death of boyfriend, was in and out of hospital, recently passed away in hospice care. Prescribed cymbalta but has been out for ~4 months. Is not in counseling   ? ?Observations/Objective: ? ?Well appearing, in NAD. Speaks in full sentences. Comfortable WOB on RA. No resp distress. Frequent coughing. ? ? ?Assessment and Plan: ? ?Problem List Items Addressed This Visit   ? ?  ? Respiratory  ? Centrilobular emphysema ([place])  ?  With current exacerbation in setting of recent URI. Rx prednisone burst and doxycycline given h/o prolonged QTc. Due for pulm follow up, recommend to make appt. Emergency precautions discussed.  ?  ?  ? Relevant Medications  ? montelukast  (SINGULAIR) 10 MG tablet  ? predniSONE (DELTASONE) 20 MG tablet  ?  ? Other  ? GAD (generalized anxiety disorder)  ? Relevant Medications  ? DULoxetine (CYMBALTA) 30 MG capsule  ? History of prolonged Q-T interval on ECG  ? Major depression in partial remission ([place]) - Primary  ? Relevant Medications  ? DULoxetine (CYMBALTA) 30 MG capsule  ? MDD (major depressive disorder), recurrent episode, moderate ([place])  ?  With current exacerbation due to death of boyfriend. Restart cymbalta. Make appt with Psych soon, due for follow up. ?  ?  ? Relevant Medications  ? DULoxetine (CYMBALTA) 30 MG capsule  ? ?Other Visit Diagnoses   ? ? COPD exacerbation ([place])      ? Relevant Medications  ? montelukast (SINGULAIR) 10 MG tablet  ? predniSONE (DELTASONE) 20 MG tablet  ? doxycycline (VIBRA-TABS) 100 MG tablet  ? ?  ? ? ? ?  ?I discussed the assessment and treatment plan with the patient. The patient was provided an opportunity to ask questions and all were answered. The patient agreed with the plan and demonstrated an understanding of the instructions. ?  ?The patient was advised to call back or seek an in-person evaluation if the symptoms worsen or if the condition fails to improve as anticipated. ? ?I provided 16 minutes of non-face-to-face time during this encounter. ? ? ?Myles Gip, DO ?

## 2021-05-08 NOTE — Telephone Encounter (Signed)
Requested Prescriptions  ?Pending Prescriptions Disp Refills  ?? insulin glargine (LANTUS SOLOSTAR) 100 UNIT/ML Solostar Pen 15 mL 0  ?  Sig: Inject 20 Units into the skin daily.  ?  ? Endocrinology:  Diabetes - Insulins Passed - 05/08/2021 11:49 AM  ?  ?  Passed - HBA1C is between 0 and 7.9 and within 180 days  ?  Hemoglobin A1C  ?Date Value Ref Range Status  ?01/31/2021 6.1 (A) 4.0 - 5.6 % Final  ?10/09/2012 7.4 (H) 4.2 - 6.3 % Final  ?  Comment:  ?  The American Diabetes Association recommends that a primary goal of ?therapy should be <7% and that physicians should reevaluate the ?treatment regimen in patients with HbA1c values consistently >8%. ?  ? ?HbA1c, POC (controlled diabetic range)  ?Date Value Ref Range Status  ?03/19/2018 13.7 (A) 0.0 - 7.0 % Final  ? ?Hgb A1c MFr Bld  ?Date Value Ref Range Status  ?08/05/2019 6.8 (H) <5.7 % of total Hgb Final  ?  Comment:  ?  For someone without known diabetes, a hemoglobin A1c ?value of 6.5% or greater indicates that they may have  ?diabetes and this should be confirmed with a follow-up  ?test. ?. ?For someone with known diabetes, a value <7% indicates  ?that their diabetes is well controlled and a value  ?greater than or equal to 7% indicates suboptimal  ?control. A1c targets should be individualized based on  ?duration of diabetes, age, comorbid conditions, and  ?other considerations. ?. ?Currently, no consensus exists regarding use of ?hemoglobin A1c for diagnosis of diabetes for children. ?. ?  ?   ?  ?  Passed - Valid encounter within last 6 months  ?  Recent Outpatient Visits   ?      ? 3 months ago Type 2 diabetes mellitus with peripheral neuropathy ([place])  ? [facility] [place], Drue Stager, MD  ? 6 months ago Chronic nausea  ? [facility] [place], Drue Stager, MD  ? 7 months ago Type 2 diabetes mellitus with peripheral neuropathy ([place])  ? [facility] [name], MD  ? 1 year ago Abnormal CT of the chest  ?  [facility] [name], MD  ? 1 year ago Type 2 diabetes mellitus with peripheral neuropathy ([place])  ? [facility] [name], MD  ?  ?  ?Future Appointments   ?        ? Today Rumball, Jake Church, DO [facility] [place]  ? In 4 weeks [name], MD [facility] [place]  ? In 8 months  [place]  ?  ? ?  ?  ?  ?? Insulin Pen Needle 32G X 6 MM MISC 100 each 2  ?  Sig: 1 each by Does not apply route daily.  ?  ? Endocrinology: Diabetes - Testing Supplies Passed - 05/08/2021 11:49 AM  ?  ?  Passed - Valid encounter within last 12 months  ?  Recent Outpatient Visits   ?      ? 3 months ago Type 2 diabetes mellitus with peripheral neuropathy ([place])  ? [facility] [place], Drue Stager, MD  ? 6 months ago Chronic nausea  ? [facility] [place], Drue Stager, MD  ? 7 months ago Type 2 diabetes mellitus with peripheral neuropathy ([place])  ? [facility] [name], MD  ? 1 year ago Abnormal CT  of the chest  ? [facility] [place], Drue Stager, MD  ? 1 year ago Type 2 diabetes mellitus with peripheral neuropathy ([place])  ? [facility] [name], MD  ?  ?  ?Future Appointments   ?        ? Today Rumball, Jake Church, DO [facility], [place]  ? In 4 weeks [name], MD [facility], [place]  ? In 8 months  [place]  ?  ? ?  ?  ?  ? ? ?

## 2021-05-08 NOTE — Assessment & Plan Note (Signed)
With current exacerbation in setting of recent URI. Rx prednisone burst and doxycycline given h/o prolonged QTc. Due for pulm follow up, recommend to make appt. Emergency precautions discussed.  ?

## 2021-05-25 ENCOUNTER — Telehealth: Payer: Self-pay

## 2021-05-25 NOTE — Progress Notes (Signed)
? ? ?Chronic Care Management ?Pharmacy Assistant  ? ?Name: Tricia [name]  MRN: [id number] DOB: 04/10/1960 ? ?Reason for Encounter: COPD Disease State Call ?  ?Recent office visits:  ?05/08/2021 Rory Percy, DO (PCP Video Visit for Cough) Started: Doxycycline Hyclate 100 mg twice daily, Prednisone 40 mg once daily, Patient to follow-up 1 month ? ?Recent consult visits:  ?None ID ? ?Hospital visits:  ?None in previous 6 months ? ?Medications: ?Outpatient Encounter Medications as of 05/25/2021  ?Medication Sig Note  ? blood glucose meter kit and supplies KIT Dispense accuchek aviva plus; can change if needed e11.9   ? budesonide-formoterol (SYMBICORT) 160-4.5 MCG/ACT inhaler Inhale 2 puffs into the lungs in the morning and at bedtime. 03/04/2020: Prescribed by Dr. Vella Kohler  ? busPIRone (BUSPAR) 7.5 MG tablet Take 7.5 mg by mouth 2 (two) times daily.   ? carbidopa-levodopa (SINEMET IR) 25-100 MG tablet Take 1.5 tablets by mouth 3 (three) times daily. 03/04/2020: Prescribed by Dr. Manuella Ghazi  ? cetirizine (ZYRTEC) 10 MG tablet Take 10 mg by mouth daily as needed for allergies.   ? colchicine 0.6 MG tablet TAKE 1 TABLET BY MOUTH ONCE DAILY   ? DULoxetine (CYMBALTA) 30 MG capsule TAKE 1 CAPSULE BY MOUTH ONCE DAILY.   ? Empagliflozin-metFORMIN HCl (SYNJARDY) 12.5-500 MG TABS Take 1 tablet by mouth 2 (two) times daily.   ? furosemide (LASIX) 40 MG tablet Take 1 tablet (40 mg total) by mouth daily as needed.   ? gabapentin (NEURONTIN) 600 MG tablet Take 1 tablet (600 mg total) by mouth 3 (three) times daily. 03/04/2020: Prescribed by Dr. Manuella Ghazi  ? glucose blood test strip Use as directed to check blood glucose daily   ? hydrOXYzine (ATARAX/VISTARIL) 25 MG tablet Take 25 mg by mouth 2 (two) times daily.   ? insulin glargine (LANTUS SOLOSTAR) 100 UNIT/ML Solostar Pen Inject 20 Units into the skin daily.   ? Insulin Pen Needle 32G X 6 MM MISC 1 each by Does not apply route daily.   ? ipratropium-albuterol (DUONEB) 0.5-2.5 (3)  MG/3ML SOLN Inhale 3 mLs into the lungs every 6 (six) hours as needed.   ? lidocaine (LIDODERM) 5 % 1 patch every 12 (twelve) hours as needed. 03/04/2020: Prescribed by Farris Has, PA-C  ? lubiprostone (AMITIZA) 24 MCG capsule Take by mouth 2 (two) times daily as needed. 03/04/2020: Prescribed by Farris Has, PA-C  ? montelukast (SINGULAIR) 10 MG tablet TAKE 1 TABLET BY MOUTH  DAILY IN THE AFTERNOON   ? montelukast (SINGULAIR) 10 MG tablet Take 1 tablet by mouth at bedtime.   ? morphine (MS CONTIN) 30 MG 12 hr tablet Take 30 mg by mouth every 12 (twelve) hours. 03/04/2020: Prescribed by Farris Has, PA-C  ? naproxen sodium (ALEVE) 220 MG tablet Take 220 mg by mouth. PRN   ? omeprazole (PRILOSEC) 40 MG capsule Take 1 capsule (40 mg total) by mouth daily.   ? ondansetron (ZOFRAN ODT) 4 MG disintegrating tablet Take 1 tablet (4 mg total) by mouth every 8 (eight) hours as needed for nausea or vomiting.   ? Oxycodone HCl 10 MG TABS Take 10 mg by mouth 4 (four) times daily as needed.  03/04/2020: Prescribed by Farris Has, PA-C  ? promethazine (PHENERGAN) 25 MG tablet Take 25 mg by mouth daily as needed. 03/04/2020: Prescribed by Dr. Manuella Ghazi  ? rosuvastatin (CRESTOR) 5 MG tablet Take 1 tablet (5 mg total) by mouth at bedtime.   ? theophylline (UNIPHYL) 400 MG 24 hr tablet Take  1 tablet by mouth daily.  03/04/2020: Prescribed by Dr. Vella Kohler  ? tiotropium (SPIRIVA) 18 MCG inhalation capsule Place 1 capsule into inhaler and inhale daily. pm 03/04/2020: Prescribed by Dr. Vella Kohler  ? tiZANidine (ZANAFLEX) 4 MG tablet Take 4 mg by mouth every 6 (six) hours as needed for muscle spasms.   ? traZODone (DESYREL) 100 MG tablet Take 1 tablet (100 mg total) by mouth at bedtime.   ? Ubrogepant (UBRELVY) 100 MG TABS Take 100 mg by mouth daily as needed. May repeat in 2 hours 03/04/2020: Prescribed by Dr. Manuella Ghazi  ? VENTOLIN HFA 108 (90 Base) MCG/ACT inhaler INHALE 1 PUFF BY MOUTH AS NEEDED   ? ?No facility-administered encounter medications on  file as of 05/25/2021.  ? ?Care Gaps: ?Zoster Vaccines ?PAP Smear ?COVID-19 Vaccine Booster 3 ?Diabetic Eye Exam ? ?Star Rating Drugs: ?Rosuvastatin 5 mg last filled on 02/27/2021 for a 90-Day supply with [facility] ?Synjardy 12.5- 500 mg last filled on 02/27/2021 for a 90-Day supply with [facility] ? ?04/06 Left HIPAA compliant VM requesting the patient to return my call ?04/10 Left HIPAA compliant VM requesting the patient to return my call ?04/11 I have attempted 3 seperate times to contact the patient to complete her monthly call. I have left HIPAA compliant voicemails requesting a return call from the patient. My attempts have been unsuccessful at this time. ? ? ?Lynann Bologna, CPA/CMA ?Clinical Pharmacist Assistant ?Phone: [phone]  ? ? ? ?

## 2021-05-26 ENCOUNTER — Other Ambulatory Visit: Payer: Self-pay | Admitting: Family Medicine

## 2021-05-26 NOTE — Telephone Encounter (Signed)
Pt called in to request a refill for promethazine (PHENERGAN) 25 MG tablet.  ? ? ? ?Future ov: 06/05/21 ? ?Pharmacy:  ?[place], [place]. Phone:  [phone]  ?Fax:  [phone]  ?  ? ?

## 2021-05-29 ENCOUNTER — Other Ambulatory Visit: Payer: Self-pay

## 2021-05-29 ENCOUNTER — Other Ambulatory Visit: Payer: Self-pay | Admitting: Family Medicine

## 2021-05-29 NOTE — Telephone Encounter (Signed)
Copied from [place] [id number]. Topic: Quick Communication - Rx Refill/Question ?>> May 29, 2021 12:29 PM Tessa Lerner A wrote: ?Medication: promethazine (PHENERGAN) 25 MG tablet [[id number]]  ? ?Has the patient contacted their pharmacy? Yes.  The patient has been directed to contact their PCP, the patient shares that they previously requested this refill on 05/26/21  ?(Agent: If no, request that the patient contact the pharmacy for the refill. If patient does not wish to contact the pharmacy document the reason why and proceed with request.) ?(Agent: If yes, when and what did the pharmacy advise?) ? ?Preferred Pharmacy (with phone number or street name): [facility] [place], [address] ?[place] [address] ?Phone: [phone] Fax: [phone] ?Hours: Not open 24 hours ? ? ?Has the patient been seen for an appointment in the last year OR does the patient have an upcoming appointment? Yes.   ? ?Agent: Please be advised that RX refills may take up to 3 business days. We ask that you follow-up with your pharmacy. ?

## 2021-05-29 NOTE — Telephone Encounter (Signed)
Requested medications are due for refill today.  unsure ? ?Requested medications are on the active medications list.  yes ? ?Last refill. 04/28/2018 ? ?Future visit scheduled.   yes ? ?Notes to clinic.  Historical medication. ? ? ? ?Requested Prescriptions  ?Pending Prescriptions Disp Refills  ? promethazine (PHENERGAN) 25 MG tablet 30 tablet   ?  Sig: Take 1 tablet (25 mg total) by mouth daily as needed.  ?  ? Not Delegated - Gastroenterology: Antiemetics Failed - 05/26/2021 12:25 PM  ?  ?  Failed - This refill cannot be delegated  ?  ?  Passed - Valid encounter within last 6 months  ?  Recent Outpatient Visits   ? ?      ? 3 weeks ago Recurrent major depressive disorder, in partial remission ([place])  ? [place], DO  ? 3 months ago Type 2 diabetes mellitus with peripheral neuropathy ([place])  ? [facility] [place], Drue Stager, MD  ? 6 months ago Chronic nausea  ? [facility] [place], Drue Stager, MD  ? 8 months ago Type 2 diabetes mellitus with peripheral neuropathy ([place])  ? [facility] [name], MD  ? 1 year ago Abnormal CT of the chest  ? [facility] [name], MD  ? ?  ?  ?Future Appointments   ? ?        ? In 1 week [name], MD [facility] [place]  ? In 8 months  [place]  ? ?  ? ?  ?  ?  ?  ?

## 2021-05-30 ENCOUNTER — Other Ambulatory Visit: Payer: Self-pay

## 2021-05-30 NOTE — Telephone Encounter (Signed)
Requested medication (s) are due for refill today:   Provider to review ? ?Requested medication (s) are on the active medication list:   Yes from 2020 as a historical medication ? ?Future visit scheduled:   Yes ? ? ?Last ordered: 04/28/2018 ? ?Returned because it's a non delegated refill plus last prescribed by a historical provider.  ? ?Requested Prescriptions  ?Pending Prescriptions Disp Refills  ? promethazine (PHENERGAN) 25 MG tablet 30 tablet   ?  Sig: Take 1 tablet (25 mg total) by mouth daily as needed.  ?  ? Not Delegated - Gastroenterology: Antiemetics Failed - 05/29/2021 12:43 PM  ?  ?  Failed - This refill cannot be delegated  ?  ?  Passed - Valid encounter within last 6 months  ?  Recent Outpatient Visits   ? ?      ? 3 weeks ago Recurrent major depressive disorder, in partial remission ([place])  ? [place], DO  ? 3 months ago Type 2 diabetes mellitus with peripheral neuropathy ([place])  ? [facility] [place], Drue Stager, MD  ? 6 months ago Chronic nausea  ? [facility] [place], Drue Stager, MD  ? 8 months ago Type 2 diabetes mellitus with peripheral neuropathy ([place])  ? [facility] [name], MD  ? 1 year ago Abnormal CT of the chest  ? [facility] [name], MD  ? ?  ?  ?Future Appointments   ? ?        ? In 6 days [name], MD [facility], [place]  ? In 8 months  [facility]  ? ?  ? ?  ?  ?  ? ?

## 2021-06-02 NOTE — Progress Notes (Signed)
Name: Tricia [name]   MRN: [id number]    DOB: 08/22/1960   Date:06/05/2021 ? ?     Progress Note ? ?Subjective ? ?Chief Complaint ? ?Follow Up ? ?HPI ? ?Parkinson's: she was diagnosed in 2021 by Dr. Trena Platt PA  with right hemibody parkinson's . She is taking Sinemet IR. She states still has balanced problems and tremors of both hands  ?  ?Diabetes insulin requiring: she states she has been taking medication currently Synjardi   A1C has been at goal last A1C was  6.1 %  .  She has dyslipidemia, and microalbuminuria.  She denies polyphagia, polydipsia or polyuria. Glucose at home between 120's-130's  She has associated dyslipidemia  and CKI , albuminuria on SGL-2 agonist .BP is low now and off ACE/ARB also off lantus due to glucose being at goal.  ?  ?Chronic pain: sees pain clinic, currently going to [facility], she states pain is still up and down, worse pain is on lumbar spine, average pain 8/10. Today pain is not controlled  ? ?Chronic nausea/gastritis/barrett's she has nausea, due for follow up with GI, advised to ask them for refill of promethazine, she states Zofran does not work well for her  ? ?Gout: she has flares intermittently, she had two episodes in the past few months and takes colchicine prn , unchanged  ?  ?Asthma and COPD and chronic airflow limitation:  under the care of Dr. Raul Del. She had CT chest done, seen by pulmonologist and referred to Endo by Dr. Raul Del for evaluation of thyroid nodule. She continues to have a productive cough but mild and stable. She is not on home oxygen. She is  still smoking , she would like to quit but has tried multiple therapies without success. Advised to keep trying  ?  ?CT chest from 03/17/2019  ?1. Insert lung rads 2 ?2. Bronchial wall thickening with fluid/debris in segmental and ?subsegmental airways to the right lower lobe. This may reflect ?infectious/inflammatory etiology but in the appropriate clinical ?setting, aspiration could have this  appearance. ?3. 1.8 cm right thyroid nodule. Recommend thyroid US.(Ref: J Am Coll ?Radiol. 2015 Feb;12(2): 143-50). ?4.  Emphysema (ICD10-J43.9) and Aortic Atherosclerosis (ICD10-170.0) ? ?Thyroid nodule: had US done by Dr. Manfred Shirts at [facility] clinic 06/2019, she states they decided not to do a biopsy , repeat US in 22 did not recommend repeat biopsy , small lymphonodo on left side, seems reactive, but with weight loss, may need a biopsy  - discussed with patient, not palpable today  ? ?  ?Major Depression: chronic and recurrent, phq 9 is high, she was under the care of Dr. [name] but lost to follow up . She recently lost her boyfriend of 20 plus years, he left her a lot of debt and not money to pay for it. She states hydroxizine and buspar are not effective but would like to continue taking trazodone for sleep and duloxetine .  ?  ?CHF: doing well at this time,  she has orthopnea - she uses two pillows .She is now under the care of Dr. Ubaldo Glassing and is on daily lasix and SGL2 agonist since 08/22. Tolerating it well. She is not on ARB or ACE due to low bp. She is on statin therapy and tolerating it well ? ?Echo from 06/05/2019  ?NORMAL LEFT VENTRICULAR SYSTOLIC FUNCTION  ?NORMAL RIGHT VENTRICULAR SYSTOLIC FUNCTION  ?MILD VALVULAR REGURGITATION (See above)  ?NO VALVULAR STENOSIS  ?Closest EF: >55% (Estimated)  ?Aortic: MILD AR  ?  Mitral: MILD MR  ?Tricuspid: MILD TR  ?Echo showed enlarged left and right atrium ?  ?Atherosclerosis of Aorta: discussed CT , she is now taking statin therapy and last LDL is down 37  ? ?Senile purpura: on both arms , stable ? ?Protein malnutrition: weight loss, without dieting She went to see GI and had EGD, but she refused colonoscopy. She is due for mammogram and reminded her to schedule it, due for follow up with GI, she has lost another 15 lbs since last visit. She has lost her spouse last month and is under more stress. She states food tastes bland, trying to eat ice cream to gain weight.  Explained she  needs to get all cancer screenings, including lung CT and follow up with GI - she was due for follow up last month ? ?Patient Active Problem List  ? Diagnosis Date Noted  ? Esophageal dysphagia   ? Gastric erythema   ? Columnar-lined esophagus   ? Mild protein-calorie malnutrition ([place]) 09/26/2020  ? Centrilobular emphysema ([place]) 03/30/2020  ? History of prolonged Q-T interval on ECG 11/18/2019  ? GAD (generalized anxiety disorder) 10/13/2019  ? MDD (major depressive disorder), recurrent episode, moderate ([place]) 10/13/2019  ? At risk for long QT syndrome 10/13/2019  ? Nonrheumatic mitral valve regurgitation 05/04/2019  ? Benign neoplasm of descending colon   ? Polyp of sigmoid colon   ? 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 Polyneuropathy 10/21/2014  ? Chronic venous insufficiency 10/05/2014  ? Bilateral leg edema 08/16/2014  ? Major depression in partial remission ([place]) 08/16/2014  ? Acid reflux 08/16/2014  ? 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 Asthma, moderate persistent 08/16/2014  ? Carpal tunnel syndrome 08/16/2014  ? Cervical pain 08/16/2014  ? CAFL (chronic airflow limitation) ([place]) 08/16/2014  ? Type 2 diabetes mellitus with peripheral neuropathy ([place]) 08/16/2014  ? Diabetes mellitus type 2, insulin dependent ([place]) 08/16/2014  ? Dyslipidemia 08/16/2014  ? Tobacco use disorder 08/16/2014  ? Essential (primary) hypertension 08/16/2014  ? Benign neoplasm of stomach 08/16/2014  ? Gout 08/16/2014  ? HLD (hyperlipidemia) 08/16/2014  ? Low back pain 08/16/2014  ? Lumbar radiculopathy 08/16/2014  ? Headache, migraine 08/16/2014  ? Arthralgia of multiple joints 08/16/2014  ? Avitaminosis D 08/16/2014  ? Primary osteoarthritis of both knees 06/22/2014  ? Benign essential tremor 10/02/2013  ? Cervical dystonia 10/02/2013  ? Chronic diastolic heart failure ([place]) 11/16/2012  ? Chronic pain 11/13/2012  ? ? ?Past Surgical History:  ?Procedure Laterality Date  ? CARPAL TUNNEL RELEASE Bilateral    ? x2 right, 1x on left  ? COLONOSCOPY    ? COLONOSCOPY WITH PROPOFOL N/A 04/25/2017  ? Procedure: COLONOSCOPY WITH PROPOFOL;  Surgeon: [name], MD;  Location: [facility];  Service: Endoscopy;  Laterality: N/A;  diabetic-oral med  ? DILATION AND CURETTAGE OF UTERUS    ? ESOPHAGOGASTRODUODENOSCOPY (EGD) WITH PROPOFOL N/A 01/10/2021  ? Procedure: ESOPHAGOGASTRODUODENOSCOPY (EGD) WITH PROPOFOL;  Surgeon: Virgel Manifold, MD;  Location: [place] [facility];  Service: Endoscopy;  Laterality: N/A;  Diabetic  ? EXTERNAL EAR SURGERY Left   ? x2  ? POLYPECTOMY  04/25/2017  ? Procedure: POLYPECTOMY INTESTINAL;  Surgeon: [name], MD;  Location: [facility];  Service: Endoscopy;;  ? SPINE SURGERY    ? herniated disc  ? TUBAL LIGATION    ? ? ?Family History  ?Problem Relation Age of Onset  ? Emphysema Mother   ? Anxiety disorder Mother   ? Stroke Father   ?  Throat cancer Father   ? Multiple sclerosis Daughter   ? Bipolar disorder Daughter   ? Cervical cancer Daughter   ? Bipolar disorder Daughter   ? Drug abuse Daughter   ? Lung cancer Maternal Aunt   ? Lung cancer Maternal Uncle   ? Lung cancer Maternal Grandmother   ? Lung cancer Maternal Grandfather   ? ? ?Social History  ? ?Tobacco Use  ? Smoking status: Every Day  ?  Packs/day: 1.00  ?  Years: 41.00  ?  Pack years: 41.00  ?  Types: Cigarettes  ?  Start date: 05/19/1977  ? Smokeless tobacco: Never  ?Substance Use Topics  ? Alcohol use: No  ?  Alcohol/week: 0.0 standard drinks  ? ? ? ?Current Outpatient Medications:  ?  blood glucose meter kit and supplies KIT, Dispense accuchek aviva plus; can change if needed e11.9, Disp: 1 each, Rfl: 0 ?  budesonide-formoterol (SYMBICORT) 160-4.5 MCG/ACT inhaler, Inhale 2 puffs into the lungs in the morning and at bedtime., Disp: , Rfl:  ?  cetirizine (ZYRTEC) 10 MG tablet, Take 10 mg by mouth daily as needed for allergies., Disp: , Rfl:  ?  colchicine 0.6 MG tablet, TAKE 1 TABLET BY MOUTH ONCE DAILY, Disp: 30  tablet, Rfl: 0 ?  Empagliflozin-metFORMIN HCl (SYNJARDY) 12.5-500 MG TABS, Take 1 tablet by mouth 2 (two) times daily., Disp: 180 tablet, Rfl: 1 ?  furosemide (LASIX) 40 MG tablet, Take 1 tablet (40 mg total)

## 2021-06-05 ENCOUNTER — Encounter: Payer: Self-pay | Admitting: Family Medicine

## 2021-06-05 ENCOUNTER — Ambulatory Visit (INDEPENDENT_AMBULATORY_CARE_PROVIDER_SITE_OTHER): Payer: Medicare Other | Admitting: Family Medicine

## 2021-06-05 VITALS — BP 110/68 | HR 93 | Resp 16 | Ht 68.0 in | Wt 142.0 lb

## 2021-06-05 DIAGNOSIS — J449 Chronic obstructive pulmonary disease, unspecified: Secondary | ICD-10-CM

## 2021-06-05 DIAGNOSIS — I700 Atherosclerosis of aorta: Secondary | ICD-10-CM | POA: Insufficient documentation

## 2021-06-05 DIAGNOSIS — G20 Parkinson's disease: Secondary | ICD-10-CM

## 2021-06-05 DIAGNOSIS — D692 Other nonthrombocytopenic purpura: Secondary | ICD-10-CM | POA: Diagnosis not present

## 2021-06-05 DIAGNOSIS — F331 Major depressive disorder, recurrent, moderate: Secondary | ICD-10-CM

## 2021-06-05 DIAGNOSIS — N1831 Chronic kidney disease, stage 3a: Secondary | ICD-10-CM

## 2021-06-05 DIAGNOSIS — G20A1 Parkinson's disease without dyskinesia, without mention of fluctuations: Secondary | ICD-10-CM | POA: Insufficient documentation

## 2021-06-05 DIAGNOSIS — I5032 Chronic diastolic (congestive) heart failure: Secondary | ICD-10-CM

## 2021-06-05 DIAGNOSIS — J432 Centrilobular emphysema: Secondary | ICD-10-CM | POA: Diagnosis not present

## 2021-06-05 DIAGNOSIS — E785 Hyperlipidemia, unspecified: Secondary | ICD-10-CM

## 2021-06-05 DIAGNOSIS — E440 Moderate protein-calorie malnutrition: Secondary | ICD-10-CM

## 2021-06-05 DIAGNOSIS — M109 Gout, unspecified: Secondary | ICD-10-CM

## 2021-06-05 DIAGNOSIS — E1142 Type 2 diabetes mellitus with diabetic polyneuropathy: Secondary | ICD-10-CM | POA: Diagnosis not present

## 2021-06-05 DIAGNOSIS — F411 Generalized anxiety disorder: Secondary | ICD-10-CM

## 2021-06-05 MED ORDER — DULOXETINE HCL 30 MG PO CPEP: ORAL_CAPSULE | ORAL | 1 refills | Status: DC

## 2021-06-05 MED ORDER — TRAZODONE HCL 100 MG PO TABS: 100.0000 mg | ORAL_TABLET | Freq: Every day | ORAL | 1 refills | Status: DC

## 2021-06-06 LAB — HEMOGLOBIN A1C
Hgb A1c MFr Bld: 6.6 % of total Hgb — ABNORMAL HIGH (ref <5.7)
Mean Plasma Glucose: 143 mg/dL
eAG (mmol/L): 7.9 mmol/L

## 2021-06-22 ENCOUNTER — Encounter: Payer: Self-pay | Admitting: Family Medicine

## 2021-07-05 ENCOUNTER — Ambulatory Visit: Payer: Self-pay

## 2021-07-05 NOTE — Progress Notes (Signed)
Name: Tricia [name]   MRN: [id number]    DOB: [birth year]   Date:07/06/2021       Progress Note  Subjective  Chief Complaint  Acute visit for lower extremity edema  HPI  She came in with her sister - Tricia [name]   Patient has multiple medication problems and over the past few weeks she has been feeling worse.  Initially she noticed lower extremity edema ( she has chronic CHF) but lasix was not helping with symptoms or elevation of lower extremities.  She has been nauseated more than usual and vomiting almost daily  She has cloudy urine and hesitancy but no fever or chills, that is going on for a long period of time Last night she woke up in the middle of the night with RUQ pain , increase in nausea and vomiting. She was crying and sister wanted to take her to [facility] but patient refused   She has DM and has been craving mash potatoes and gravy. She has glucosuria and glucose in our office was 109  She has lost a total of 15 lbs since Dec 2023  Spoke to patient and she agreed to go to [facility] due to multitude of symptoms   Patient Active Problem List   Diagnosis Date Noted   Senile purpura ([place]) 06/05/2021   Atherosclerosis of aorta ([place]) 06/05/2021   Parkinson's disease ([place]) 06/05/2021   Chronic kidney disease (CKD) stage G3a/A2, moderately decreased glomerular filtration rate (GFR) between 45-59 mL/min/1.73 square meter and albuminuria creatinine ratio between 30-299 mg/g ([place]) 06/05/2021   Esophageal dysphagia    Gastric erythema    Columnar-lined esophagus    Moderate malnutrition ([place]) 09/26/2020   Centrilobular emphysema ([place]) 03/30/2020   History of prolonged Q-T interval on ECG 11/18/2019   GAD (generalized anxiety disorder) 10/13/2019   MDD (major depressive disorder), recurrent episode, moderate ([place]) 10/13/2019   At risk for long QT syndrome 10/13/2019   Nonrheumatic mitral valve regurgitation 05/04/2019   Benign neoplasm of descending colon    Polyp of sigmoid colon     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   Polyneuropathy 10/21/2014   Chronic venous insufficiency 10/05/2014   Bilateral leg edema 08/16/2014   Major depression in partial remission ([place]) 08/16/2014   Acid reflux 08/16/2014   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   Asthma, moderate persistent 08/16/2014   Carpal tunnel syndrome 08/16/2014   Cervical pain 08/16/2014   CAFL (chronic airflow limitation) ([place]) 08/16/2014   Type 2 diabetes mellitus with peripheral neuropathy ([place]) 08/16/2014   Diabetes mellitus type 2, insulin dependent ([place]) 08/16/2014   Dyslipidemia 08/16/2014   Tobacco use disorder 08/16/2014   Essential (primary) hypertension 08/16/2014   Benign neoplasm of stomach 08/16/2014   Gout 08/16/2014   HLD (hyperlipidemia) 08/16/2014   Low back pain 08/16/2014   Lumbar radiculopathy 08/16/2014   Headache, migraine 08/16/2014   Arthralgia of multiple joints 08/16/2014   Avitaminosis D 08/16/2014   Primary osteoarthritis of both knees 06/22/2014   Benign essential tremor 10/02/2013   Cervical dystonia 10/02/2013   Chronic diastolic heart failure ([place]) 11/16/2012   Chronic pain 11/13/2012    Past Surgical History:  Procedure Laterality Date   CARPAL TUNNEL RELEASE Bilateral    x2 right, 1x on left   COLONOSCOPY     COLONOSCOPY WITH PROPOFOL N/A 04/25/2017   Procedure: COLONOSCOPY WITH PROPOFOL;  Surgeon: [name], MD;  Location: [facility];  Service: Endoscopy;  Laterality: N/A;  diabetic-oral med   DILATION AND CURETTAGE  OF UTERUS     ESOPHAGOGASTRODUODENOSCOPY (EGD) WITH PROPOFOL N/A 01/10/2021   Procedure: ESOPHAGOGASTRODUODENOSCOPY (EGD) WITH PROPOFOL;  Surgeon: Virgel Manifold, MD;  Location: [facility];  Service: Endoscopy;  Laterality: N/A;  Diabetic   EXTERNAL EAR SURGERY Left    x2   POLYPECTOMY  04/25/2017   Procedure: POLYPECTOMY INTESTINAL;  Surgeon: [name], MD;  Location: [facility];  Service: Endoscopy;;    SPINE SURGERY     herniated disc   TUBAL LIGATION      Family History  Problem Relation Age of Onset   Emphysema Mother    Anxiety disorder Mother    Stroke Father    Throat cancer Father    Multiple sclerosis Daughter    Bipolar disorder Daughter    Cervical cancer Daughter    Bipolar disorder Daughter    Drug abuse Daughter    Lung cancer Maternal Aunt    Lung cancer Maternal Uncle    Lung cancer Maternal Grandmother    Lung cancer Maternal Grandfather     Social History   Tobacco Use   Smoking status: Every Day    Packs/day: 1.00    Years: 41.00    Pack years: 41.00    Types: Cigarettes    Start date: 05/19/1977   Smokeless tobacco: Never  Substance Use Topics   Alcohol use: No    Alcohol/week: 0.0 standard drinks     Current Outpatient Medications:    blood glucose meter kit and supplies KIT, Dispense accuchek aviva plus; can change if needed e11.9, Disp: 1 each, Rfl: 0   budesonide-formoterol (SYMBICORT) 160-4.5 MCG/ACT inhaler, Inhale 2 puffs into the lungs in the morning and at bedtime., Disp: , Rfl:    busPIRone (BUSPAR) 7.5 MG tablet, Take 7.5 mg by mouth 2 (two) times daily., Disp: , Rfl:    cetirizine (ZYRTEC) 10 MG tablet, Take 10 mg by mouth daily as needed for allergies., Disp: , Rfl:    colchicine 0.6 MG tablet, TAKE 1 TABLET BY MOUTH ONCE DAILY, Disp: 30 tablet, Rfl: 0   colchicine 0.6 MG tablet, Take by mouth., Disp: , Rfl:    doxycycline (VIBRAMYCIN) 100 MG capsule, Take 100 mg by mouth 2 (two) times daily., Disp: , Rfl:    DULoxetine (CYMBALTA) 30 MG capsule, TAKE 1 CAPSULE BY MOUTH ONCE DAILY., Disp: 90 capsule, Rfl: 1   Empagliflozin-metFORMIN HCl (SYNJARDY) 12.5-500 MG TABS, Take 1 tablet by mouth 2 (two) times daily., Disp: 180 tablet, Rfl: 1   furosemide (LASIX) 40 MG tablet, Take 1 tablet (40 mg total) by mouth daily as needed., Disp: 30 tablet, Rfl: 2   gabapentin (NEURONTIN) 600 MG tablet, Take 1,200 mg by mouth 3 (three) times daily., Disp:  , Rfl:    glucose blood test strip, Use as directed to check blood glucose daily, Disp: 100 each, Rfl: 2   hydrOXYzine (ATARAX) 25 MG tablet, Take 25 mg by mouth 2 (two) times daily., Disp: , Rfl:    Insulin Pen Needle 32G X 6 MM MISC, 1 each by Does not apply route daily., Disp: 100 each, Rfl: 2   ipratropium-albuterol (DUONEB) 0.5-2.5 (3) MG/3ML SOLN, Inhale 3 mLs into the lungs every 6 (six) hours as needed., Disp: 360 mL, Rfl: 3   lidocaine (LIDODERM) 5 %, 1 patch every 12 (twelve) hours as needed., Disp: , Rfl:    lubiprostone (AMITIZA) 24 MCG capsule, Take by mouth 2 (two) times daily as needed., Disp: , Rfl:  montelukast (SINGULAIR) 10 MG tablet, Take 1 tablet by mouth at bedtime., Disp: , Rfl:    morphine (MS CONTIN) 30 MG 12 hr tablet, Take 30 mg by mouth every 12 (twelve) hours., Disp: , Rfl:    omeprazole (PRILOSEC) 40 MG capsule, Take 1 capsule (40 mg total) by mouth daily., Disp: 90 capsule, Rfl: 0   ondansetron (ZOFRAN-ODT) 4 MG disintegrating tablet, Take 4 mg by mouth every 8 (eight) hours as needed., Disp: , Rfl:    Oxycodone HCl 10 MG TABS, Take 10 mg by mouth 4 (four) times daily as needed. , Disp: , Rfl:    oxyCODONE-acetaminophen (PERCOCET) 10-325 MG tablet, Take 1 tablet by mouth 4 (four) times daily as needed., Disp: , Rfl:    promethazine (PHENERGAN) 25 MG tablet, Take 25 mg by mouth daily as needed., Disp: , Rfl:    rosuvastatin (CRESTOR) 5 MG tablet, Take 1 tablet (5 mg total) by mouth at bedtime., Disp: 90 tablet, Rfl: 1   theophylline (UNIPHYL) 400 MG 24 hr tablet, Take 1 tablet by mouth daily. , Disp: , Rfl:    theophylline (UNIPHYL) 400 MG 24 hr tablet, Take by mouth., Disp: , Rfl:    tiotropium (SPIRIVA) 18 MCG inhalation capsule, Place 1 capsule into inhaler and inhale daily. pm, Disp: , Rfl:    tiZANidine (ZANAFLEX) 4 MG tablet, Take 4 mg by mouth every 6 (six) hours as needed for muscle spasms., Disp: , Rfl:    traZODone (DESYREL) 100 MG tablet, Take 1 tablet  (100 mg total) by mouth at bedtime., Disp: 90 tablet, Rfl: 1   Ubrogepant (UBRELVY) 100 MG TABS, Take 100 mg by mouth daily as needed. May repeat in 2 hours, Disp: , Rfl:    VENTOLIN HFA 108 (90 Base) MCG/ACT inhaler, INHALE 1 PUFF BY MOUTH AS NEEDED, Disp: 18 g, Rfl: 0   carbidopa-levodopa (SINEMET IR) 25-100 MG tablet, Take 1.5 tablets by mouth 3 (three) times daily., Disp: , Rfl:   Allergies  Allergen Reactions   Augmentin [Amoxicillin-Pot Clavulanate] Diarrhea   Penicillins Itching    I personally reviewed active problem list, medication list, allergies with the patient/caregiver today.   ROS  Ten systems reviewed and is negative except as mentioned in HPI  She has chronic orthopnea  Objective  Vitals:   07/06/21 1010  BP: 120/70  Pulse: 98  Resp: 16  Temp: 98 F (36.7 C)  TempSrc: Oral  SpO2: 93%  Weight: 142 lb 12.8 oz (64.8 kg)  Height: 5' 8"  (1.727 m)    Body mass index is 21.71 kg/m.  Physical Exam  Constitutional: Patient appears malnourished  No distress.  HEENT: head atraumatic, normocephalic, pupils equal and reactive to light, neck supple Cardiovascular: Normal rate, regular rhythm and normal heart sounds.  No murmur heard. 3 plus BLE edema. Pulmonary/Chest: Effort normal , scattered rhonchi but no rales No respiratory distress. Abdominal: Soft.  There is mild RUQ  tenderness, positive Left CVA tenderness, tender during palpation of LUQ, Normal bowel sounds  . Psychiatric: Patient has a normal mood and affect. behavior is normal. Judgment and thought content normal.   Recent Results (from the past 2160 hour(s))  HgB A1c     Status: Abnormal   Collection Time: 06/05/21  2:54 PM  Result Value Ref Range   Hgb A1c MFr Bld 6.6 (H) <5.7 % of total Hgb    Comment: For someone without known diabetes, a hemoglobin A1c value of 6.5% or greater indicates that they may  have  diabetes and this should be confirmed with a follow-up  test. . For someone with  known diabetes, a value <7% indicates  that their diabetes is well controlled and a value  greater than or equal to 7% indicates suboptimal  control. A1c targets should be individualized based on  duration of diabetes, age, comorbid conditions, and  other considerations. . Currently, no consensus exists regarding use of hemoglobin A1c for diagnosis of diabetes for children. .    Mean Plasma Glucose 143 mg/dL   eAG (mmol/L) 7.9 mmol/L  POCT Urinalysis Dipstick     Status: Abnormal   Collection Time: 07/06/21 10:31 AM  Result Value Ref Range   Color, UA Dark Yellow    Clarity, UA Cloudy    Glucose, UA Positive (A) Negative   Bilirubin, UA Small    Ketones, UA Negative    Spec Grav, UA 1.010 1.010 - 1.025   Blood, UA Large    pH, UA 5.0 5.0 - 8.0   Protein, UA Positive (A) Negative   Urobilinogen, UA 0.2 0.2 or 1.0 E.U./dL   Nitrite, UA Positive    Leukocytes, UA Moderate (2+) (A) Negative   Appearance Cloudy/Turbid    Odor Mal      PHQ2/9:    07/06/2021   10:20 AM 06/05/2021    1:57 PM 05/08/2021   12:55 PM 01/31/2021    1:59 PM 11/03/2020    8:05 AM  Depression screen PHQ 2/9  Decreased Interest 1 3 0 3 3  Down, Depressed, Hopeless 3 3 3 3 3   PHQ - 2 Score 4 6 3 6 6   Altered sleeping 1 3 1 3 3   Tired, decreased energy 2 3 1 3 3   Change in appetite 3 2 0 3 0  Feeling bad or failure about yourself  0 0 0 0 1  Trouble concentrating 2 3 0 2 0  Moving slowly or fidgety/restless 0 0 0 2 0  Suicidal thoughts 0 0 0 0 0  PHQ-9 Score 12 17 5 19 13   Difficult doing work/chores Somewhat difficult  Somewhat difficult Not difficult at all     phq 9 is positive   Fall Risk:    07/06/2021   10:20 AM 06/05/2021    1:57 PM 05/08/2021   12:55 PM 01/31/2021    2:23 PM 01/31/2021    2:02 PM  Fall Risk   Falls in the past year? 1 1 1 1 1   Number falls in past yr: 1 0 0 1 1  Injury with Fall? 1 1 1  0 0  Risk for fall due to : History of fall(s) Impaired balance/gait Impaired  balance/gait History of fall(s) History of fall(s)  Follow up Education provided;Falls evaluation completed;Falls prevention discussed Falls prevention discussed Falls prevention discussed Falls prevention discussed Falls prevention discussed    Functional Status Survey: Is the patient deaf or have difficulty hearing?: Yes Does the patient have difficulty seeing, even when wearing glasses/contacts?: No Does the patient have difficulty concentrating, remembering, or making decisions?: Yes Does the patient have difficulty walking or climbing stairs?: Yes Does the patient have difficulty dressing or bathing?: No Does the patient have difficulty doing errands alone such as visiting a doctor's office or shopping?: No    Assessment & Plan  1. Pain of upper abdomen  Spoke to Triage nurse [facility]  Explained patient has a history of poor compliance, may have pyelonephritis/infected kidney stone? , also had RUQ pain last night with a  personal history of gallstones. Needs CT abdomen pelvis and labs  She does not look septic but would benefit to at least get IV antibiotics prior to discharge to cover UTI since urine has leucocytes , blood and nitrates  - POCT Urinalysis Dipstick - Urine Culture  2. Nausea and vomiting, unspecified vomiting type  Worse over the past couple of weeks   3. Moderate malnutrition ([place])  Continues to lose weight   4. Cloudy urine  UTI needs to be treated   5. Bilateral lower extremity edema  She has a history of CHF, explained it may be secondary to kidney dysfunction or CHF flare, anemia can also cause symptoms and less likely secondary to malnutrition and to be evaluated at [facility]   6. Chronic diastolic heart failure (HCC)  Acute flare could be the cause of increase in lower extremity edema  7. Type 2 diabetes mellitus with peripheral neuropathy (HCC)  - POCT Glucose (CBG) - normal glucose now - Not in DKA

## 2021-07-05 NOTE — Telephone Encounter (Signed)
?  Chief Complaint: bilateral leg swelling ?Symptoms: knee down leg swelling ?Frequency: ongoing - past few weeks ?Pertinent Negatives: Patient denies SOB, fever. ?Disposition: '[]'$ ED /'[]'$ Urgent Care (no appt availability in office) / '[x]'$ Appointment(In office/virtual)/ '[]'$  [facility]/ '[]'$ Home Care/ '[]'$ Refused Recommended Disposition /'[]'$ [facility]/ '[]'$  Follow-up with PCP ?Additional Notes: Pt has had swollen feet and lower legs for the past few weeks. She has been taking an other person's medication to reduce fluids. Medication is not effective. ?Reason for Disposition ? [1] MODERATE pain (e.g., interferes with normal activities, limping) AND [2] present > 3 days ? ?Answer Assessment - Initial Assessment Questions ?1. LOCATION: "Which ankle is swollen?" "Where is the swelling?" ?    Both below knees down ?2. ONSET: "When did the swelling start?" ?    Couple of weeks ?3. SIZE: "How large is the swelling?" ?    Moderate swelling ?4. PAIN: "Is there any pain?" If Yes, ask: "How bad is it?" (Scale 1-10; or mild, moderate, severe) ?  - NONE (0): no pain. ?  - MILD (1-3): doesn't interfere with normal activities.  ?  - MODERATE (4-7): interferes with normal activities (e.g., work or school) or awakens from sleep, limping.  ?  - SEVERE (8-10): excruciating pain, unable to do any normal activities, unable to walk.  ?    5/10 ?5. CAUSE: "What do you think caused the ankle swelling?" ?    fluid ?6. OTHER SYMPTOMS: "Do you have any other symptoms?" (e.g., fever, chest pain, difficulty breathing, calf pain) ?    warm ?7. PREGNANCY: "Is there any chance you are pregnant?" "When was your last menstrual period?" ?    Na ? ?Protocols used: Ankle Swelling-A-AH ? ?

## 2021-07-06 ENCOUNTER — Encounter: Payer: Self-pay | Admitting: Radiology

## 2021-07-06 ENCOUNTER — Emergency Department: Payer: Medicare Other

## 2021-07-06 ENCOUNTER — Other Ambulatory Visit: Payer: Self-pay

## 2021-07-06 ENCOUNTER — Ambulatory Visit (INDEPENDENT_AMBULATORY_CARE_PROVIDER_SITE_OTHER): Payer: Medicare Other | Admitting: Family Medicine

## 2021-07-06 ENCOUNTER — Emergency Department
Admission: EM | Admit: 2021-07-06 | Discharge: 2021-07-06 | Disposition: A | Discharge status: Home / Self Care | Payer: Medicare Other | Attending: Emergency Medicine | Admitting: Emergency Medicine

## 2021-07-06 ENCOUNTER — Encounter: Payer: Self-pay | Admitting: Family Medicine

## 2021-07-06 VITALS — BP 120/70 | HR 98 | Temp 98.0°F | Resp 16 | Ht 68.0 in | Wt 142.8 lb

## 2021-07-06 DIAGNOSIS — N200 Calculus of kidney: Secondary | ICD-10-CM | POA: Diagnosis not present

## 2021-07-06 DIAGNOSIS — R112 Nausea with vomiting, unspecified: Secondary | ICD-10-CM

## 2021-07-06 DIAGNOSIS — R8290 Unspecified abnormal findings in urine: Secondary | ICD-10-CM

## 2021-07-06 DIAGNOSIS — M7989 Other specified soft tissue disorders: Secondary | ICD-10-CM | POA: Insufficient documentation

## 2021-07-06 DIAGNOSIS — E1142 Type 2 diabetes mellitus with diabetic polyneuropathy: Secondary | ICD-10-CM

## 2021-07-06 DIAGNOSIS — R600 Localized edema: Secondary | ICD-10-CM

## 2021-07-06 DIAGNOSIS — E440 Moderate protein-calorie malnutrition: Secondary | ICD-10-CM

## 2021-07-06 DIAGNOSIS — R1010 Upper abdominal pain, unspecified: Secondary | ICD-10-CM

## 2021-07-06 DIAGNOSIS — J449 Chronic obstructive pulmonary disease, unspecified: Secondary | ICD-10-CM | POA: Diagnosis not present

## 2021-07-06 DIAGNOSIS — E876 Hypokalemia: Secondary | ICD-10-CM | POA: Insufficient documentation

## 2021-07-06 DIAGNOSIS — R110 Nausea: Secondary | ICD-10-CM | POA: Insufficient documentation

## 2021-07-06 DIAGNOSIS — N12 Tubulo-interstitial nephritis, not specified as acute or chronic: Secondary | ICD-10-CM

## 2021-07-06 DIAGNOSIS — R339 Retention of urine, unspecified: Secondary | ICD-10-CM

## 2021-07-06 DIAGNOSIS — I5032 Chronic diastolic (congestive) heart failure: Secondary | ICD-10-CM

## 2021-07-06 DIAGNOSIS — R1084 Generalized abdominal pain: Secondary | ICD-10-CM | POA: Diagnosis present

## 2021-07-06 LAB — URINALYSIS, ROUTINE W REFLEX MICROSCOPIC
Bilirubin Urine: NEGATIVE
Glucose, UA: >=500 mg/dL — AB
Ketones, ur: NEGATIVE mg/dL
Nitrite: NEGATIVE
Protein, ur: 30 mg/dL — AB
Specific Gravity, Urine: 1.01 (ref 1.005–1.030)
WBC, UA: >50 WBC/hpf — ABNORMAL HIGH (ref 0–5)
pH: 5 (ref 5.0–8.0)

## 2021-07-06 LAB — POCT URINALYSIS DIPSTICK
Glucose, UA: POSITIVE — AB
Ketones, UA: NEGATIVE
Nitrite, UA: POSITIVE
Protein, UA: POSITIVE — AB
Spec Grav, UA: 1.01 (ref 1.010–1.025)
Urobilinogen, UA: 0.2 E.U./dL
pH, UA: 5 (ref 5.0–8.0)

## 2021-07-06 LAB — COMPREHENSIVE METABOLIC PANEL
ALT: 9 U/L (ref 0–44)
AST: 18 U/L (ref 15–41)
Albumin: 2.8 g/dL — ABNORMAL LOW (ref 3.5–5.0)
Alkaline Phosphatase: 119 U/L (ref 38–126)
Anion gap: 11 (ref 5–15)
BUN: 21 mg/dL — ABNORMAL HIGH (ref 6–20)
CO2: 28 mmol/L (ref 22–32)
Calcium: 8.5 mg/dL — ABNORMAL LOW (ref 8.9–10.3)
Chloride: 94 mmol/L — ABNORMAL LOW (ref 98–111)
Creatinine, Ser: 1.13 mg/dL — ABNORMAL HIGH (ref 0.44–1.00)
GFR, Estimated: 56 mL/min — ABNORMAL LOW (ref >60)
Glucose, Bld: 102 mg/dL — ABNORMAL HIGH (ref 70–99)
Potassium: 3.2 mmol/L — ABNORMAL LOW (ref 3.5–5.1)
Sodium: 133 mmol/L — ABNORMAL LOW (ref 135–145)
Total Bilirubin: 0.7 mg/dL (ref 0.3–1.2)
Total Protein: 6.3 g/dL — ABNORMAL LOW (ref 6.5–8.1)

## 2021-07-06 LAB — LIPASE, BLOOD: Lipase: 17 U/L (ref 11–51)

## 2021-07-06 LAB — CBC
HCT: 41.7 % (ref 36.0–46.0)
Hemoglobin: 13.9 g/dL (ref 12.0–15.0)
MCH: 30.8 pg (ref 26.0–34.0)
MCHC: 33.3 g/dL (ref 30.0–36.0)
MCV: 92.3 fL (ref 80.0–100.0)
Platelets: 341 10*3/uL (ref 150–400)
RBC: 4.52 MIL/uL (ref 3.87–5.11)
RDW: 12.8 % (ref 11.5–15.5)
WBC: 7.9 10*3/uL (ref 4.0–10.5)
nRBC: 0 % (ref 0.0–0.2)

## 2021-07-06 LAB — GLUCOSE, POCT (MANUAL RESULT ENTRY): POC Glucose: 109 mg/dl — AB (ref 70–99)

## 2021-07-06 LAB — TROPONIN I (HIGH SENSITIVITY): Troponin I (High Sensitivity): 3 ng/L (ref <18)

## 2021-07-06 LAB — BRAIN NATRIURETIC PEPTIDE: B Natriuretic Peptide: 30.9 pg/mL (ref 0.0–100.0)

## 2021-07-06 MED ORDER — SODIUM CHLORIDE 0.9 % IV SOLN
2.0000 g | Freq: Once | INTRAVENOUS | Status: AC
  Administered 2021-07-06: 2 g via INTRAVENOUS
  Filled 2021-07-06: qty 20

## 2021-07-06 MED ORDER — IOHEXOL 300 MG/ML  SOLN
100.0000 mL | Freq: Once | INTRAMUSCULAR | Status: AC | PRN
  Administered 2021-07-06: 100 mL via INTRAVENOUS

## 2021-07-06 MED ORDER — FENTANYL CITRATE PF 50 MCG/ML IJ SOSY
50.0000 ug | PREFILLED_SYRINGE | Freq: Once | INTRAMUSCULAR | Status: AC
  Administered 2021-07-06: 50 ug via INTRAVENOUS
  Filled 2021-07-06: qty 1

## 2021-07-06 MED ORDER — ONDANSETRON HCL 4 MG/2ML IJ SOLN
4.0000 mg | Freq: Once | INTRAMUSCULAR | Status: AC
  Administered 2021-07-06: 4 mg via INTRAVENOUS
  Filled 2021-07-06: qty 2

## 2021-07-06 MED ORDER — POTASSIUM CHLORIDE CRYS ER 20 MEQ PO TBCR
40.0000 meq | EXTENDED_RELEASE_TABLET | Freq: Once | ORAL | Status: AC
  Administered 2021-07-06: 40 meq via ORAL
  Filled 2021-07-06: qty 2

## 2021-07-06 MED ORDER — OXYCODONE HCL 5 MG PO TABS
5.0000 mg | ORAL_TABLET | Freq: Once | ORAL | Status: AC
  Administered 2021-07-06: 5 mg via ORAL
  Filled 2021-07-06: qty 1

## 2021-07-06 MED ORDER — CEFDINIR 300 MG PO CAPS: 300.0000 mg | ORAL_CAPSULE | Freq: Two times a day (BID) | ORAL | 0 refills | Status: AC

## 2021-07-06 MED ORDER — ONDANSETRON 4 MG PO TBDP: 4.0000 mg | ORAL_TABLET | Freq: Three times a day (TID) | ORAL | 0 refills | Status: AC | PRN

## 2021-07-06 MED ORDER — SODIUM CHLORIDE 0.9 % IV BOLUS
1000.0000 mL | Freq: Once | INTRAVENOUS | Status: DC
Start: 2021-07-06 — End: 2021-07-06

## 2021-07-06 NOTE — ED Triage Notes (Signed)
Pt here with abd pain that started 2-3 weeks ago that has gotten worse over time. Pt states N/V but denies diarrhea. Pt states pain is all over.

## 2021-07-06 NOTE — ED Provider Notes (Addendum)
[facility] Provider Note    Event Date/Time   First MD Initiated Contact with Patient 07/06/21 1158     (approximate)   History   Abdominal Pain   HPI  Tricia [name] is a 61 y.o. female with history of COPD who comes in with abdominal pain.  Patient reports some generalized abdominal pain for the past 3 days but it was significantly more worse last night with pain in the right upper quadrant.  Patient was seen by her primary care doctor diagnosed with a UTI but sent over here to rule out kidney stones, pyelonephritis, cholecystitis.  She reports some associated nausea but no vomiting.  Normal bowel movements.  No new chest pain or shortness of breath.  Physical Exam   Triage Vital Signs: ED Triage Vitals  Enc Vitals Group     BP 07/06/21 1141 139/69     Pulse Rate 07/06/21 1141 98     Resp 07/06/21 1141 18     Temp 07/06/21 1141 98 F (36.7 C)     Temp Source 07/06/21 1141 Oral     SpO2 07/06/21 1141 93 %     Weight 07/06/21 1144 142 lb (64.4 kg)     Height 07/06/21 1144 '5\' 8"'$  (1.727 m)     Head Circumference --      Peak Flow --      Pain Score 07/06/21 1144 7     Pain Loc --      Pain Edu? --      Excl. in [place]? --     Most recent vital signs: Vitals:   07/06/21 1141  BP: 139/69  Pulse: 98  Resp: 18  Temp: 98 F (36.7 C)  SpO2: 93%     General: Awake, no distress.  CV:  Good peripheral perfusion.  Resp:  Normal effort.  Abd:  Patient tender throughout her abdomen but more in the upper abdomen. Other:  Patient has some swelling noted in both legs.  1+ edema bilaterally.   ED Results / Procedures / Treatments   Labs (all labs ordered are listed, but only abnormal results are displayed) Labs Reviewed  COMPREHENSIVE METABOLIC PANEL - Abnormal; Notable for the following components:      Result Value   Sodium 133 (*)    Potassium 3.2 (*)    Chloride 94 (*)    Glucose, Bld 102 (*)    BUN 21 (*)    Creatinine, Ser 1.13  (*)    Calcium 8.5 (*)    Total Protein 6.3 (*)    Albumin 2.8 (*)    GFR, Estimated 56 (*)    All other components within normal limits  URINE CULTURE  LIPASE, BLOOD  CBC  URINALYSIS, ROUTINE W REFLEX MICROSCOPIC     EKG  My interpretation of EKG:  Normal sinus rate of 89 without any ST elevation or T wave inversions, normal intervals  RADIOLOGY I have reviewed the CT personally and interpreted it and I am concerned that patient has some urinary retention with some hydronephrosis right greater than left.    IMPRESSION: 1. Mild bladder distension with wall thickening and mucosal hyperenhancement, consistent with cystitis. Concurrent ureteric and renal pelvic urothelial thickening and hyperenhancement, consistent with ascending infection. Mild right greater than left pyelonephritis. 2. Cholelithiasis 3. Development of bibasilar infection, including atypical etiologies. 4. Possible constipation. Areas of solid, stool like material within small bowel are favored to be related to decreased transit time/small bowel stasis. 5.  Apparent right-sided colonic wall thickening is favored to be due to underdistention. Correlate with symptoms of colitis, felt less likely. 6. Mild pelvic adenopathy, favored to be reactive. 7. Left nephrolithiasis. 8.  Aortic Atherosclerosis (ICD10-I70.0).  IMPRESSION: 1. Moderately distended gallbladder containing multiple mobile gallstones. No wall thickening and no sonographic Murphy sign on exam to suggest acute cholecystitis. 2. The echogenicity of the liver is increased. This is a nonspecific finding but is most commonly seen with fatty infiltration of the liver. There are no obvious focal liver lesions.    PROCEDURES:  Critical Care performed: No  Procedures   MEDICATIONS ORDERED IN ED: Medications  oxyCODONE (Oxy IR/ROXICODONE) immediate release tablet 5 mg (has no administration in time range)  cefTRIAXone (ROCEPHIN) 2 g in  sodium chloride 0.9 % 100 mL IVPB (0 g Intravenous Stopped 07/06/21 1445)  fentaNYL (SUBLIMAZE) injection 50 mcg (50 mcg Intravenous Given 07/06/21 1226)  ondansetron (ZOFRAN) injection 4 mg (4 mg Intravenous Given 07/06/21 1227)  iohexol (OMNIPAQUE) 300 MG/ML solution 100 mL (100 mLs Intravenous Contrast Given 07/06/21 1234)  potassium chloride SA (KLOR-CON M) CR tablet 40 mEq (40 mEq Oral Given 07/06/21 1402)     IMPRESSION / MDM / ASSESSMENT AND PLAN / ED COURSE  I reviewed the triage vital signs and the nursing notes.   Patient comes in with acute abdominal pain concerning for acute life-threatening pathology such as appendicitis, diverticulitis, kidney stone, pyelonephritis, cholecystitis.  Given she is got pain with known gallstones will get ultrasound to evaluate for cholecystitis as well as CT to evaluate for other pathology.  Patient given some IV fentanyl IV Zofran to help with symptoms.  Urine concerning for UTI so given a dose of IV ceftriaxone.  Does not meet septic criteria    Troponin negative.  Lipase normal.  Patient has slightly elevated creatinine at 1.13 slightly low sodium and chloride but holding off on giving fluids due to patient now reporting some leg swelling as well.  Given her BNP is normal we will get ultrasound to make sure no evidence of blood clots.  CT scan consistent with pyelonephritis but the bladder is also distended some concern that patient might have urinary retention.  Patient given dose of IV antibiotics for UTI and then will get postvoid bladder scan.  Patient attempted to urinate but even after urination her postvoid residual was over 500 cc.  Discussed with patient my concern that she could have urinary retention.  Recommended Foley placement which she was okay with proceeding.  700cc came out with foley placement..   The question is why she is now having urinary retention.  It could be from possible UTI versus from possibly back problems.  She does report a  history of severe back degenerative changes and reports lower back pain therefore will get MRI to make sure no evidence of cord compression.  Patient reports that she has been trying to take more torsemide and more Lasix to try to help with her leg swelling but I suspect that given the urinary retention has not been as effective.  I considered admission for patient however Patient is not septic she has no white count and is otherwise well-appearing so I think outpatient management with Foley and antibiotics and following up with urology would be best.  Patient felt comfortable with this plan.  Assuming MRI is negative will discharge patient  IMPRESSION: 1. No substantial impingement or specific lesion involving the conus or cauda equina to directly correlate with the patient's symptoms.  Reduced exam sensitivity due to motion artifact 2. Mild disc bulges at L2-3, L3-4, and L4-5. 3. The fatty filum seen on 07/12/2011 is not well appreciated on today's exam, presumably due to the degree of motion artifact.     Patient tolerating p.o. and feels comfortable with discharge home on antibiotics and will follow-up with urology.  We discussed some MiraLAX to help with some constipation.  I discussed the provisional nature of ED diagnosis, the treatment so far, the ongoing plan of care, follow up appointments and return precautions with the patient and any family or support people present. They expressed understanding and agreed with the plan, discharged home.     FINAL CLINICAL IMPRESSION(S) / ED DIAGNOSES   Final diagnoses:  Pyelonephritis  Urinary retention     Rx / DC Orders   ED Discharge Orders     None        Note:  This document was prepared using Dragon voice recognition software and may include unintentional dictation errors.   Vanessa Fleming, MD 07/06/21 Greer Ee     Vanessa Massillon, MD 07/06/21 Darlin Drop    Vanessa Petersburg, MD 07/06/21 Einar Crow    Vanessa Lodgepole, MD 07/06/21  Pauline Aus

## 2021-07-06 NOTE — ED Notes (Signed)
Pt going to US

## 2021-07-06 NOTE — ED Notes (Signed)
The pt was bladder scanned, resulted 526 cc's of urine. Dr. Jari Pigg notified.

## 2021-07-06 NOTE — Discharge Instructions (Addendum)
We have started you on some antibiotics due to concern for an infection in your urine that is traveled up into your kidneys but there was also concern for retention which we have placed a Foley.  You need to call the urology number to get a follow-up appointment. Follow up recheck of your blood work next week.     IMPRESSION: 1. No substantial impingement or specific lesion involving the conus or cauda equina to directly correlate with the patient's symptoms. Reduced exam sensitivity due to motion artifact 2. Mild disc bulges at L2-3, L3-4, and L4-5. 3. The fatty filum seen on 07/12/2011 is not well appreciated on today's exam, presumably due to the degree of motion artifact.        1. Mild bladder distension with wall thickening and mucosal hyperenhancement, consistent with cystitis. Concurrent ureteric and renal pelvic urothelial thickening and hyperenhancement, consistent with ascending infection. Mild right greater than left pyelonephritis. 2. Cholelithiasis 3. Development of bibasilar infection, including atypical etiologies. 4. Possible constipation. Areas of solid, stool like material within small bowel are favored to be related to decreased transit time/small bowel stasis. 5. Apparent right-sided colonic wall thickening is favored to be due to underdistention. Correlate with symptoms of colitis, felt less likely. 6. Mild pelvic adenopathy, favored to be reactive. 7. Left nephrolithiasis. 8.  Aortic Atherosclerosis (ICD10-I70.0).

## 2021-07-07 LAB — URINE CULTURE
Culture: NO GROWTH
MICRO NUMBER:: 13415554
SPECIMEN QUALITY:: ADEQUATE

## 2021-07-10 NOTE — Progress Notes (Unsigned)
Name: Tricia [name]   MRN: [id number]    DOB: 24-Jun-1960   Date:07/11/2021       Progress Note  Subjective  Chief Complaint  Follow Up  HPI  EC follow up: she was seen last week for acute visit and sent to [facility] due to multiple problems and urine was very thick and cloudy. She had multiple tests done at [facility], diagnosed with pyelonephritis and treated with dose of antibiotics there and sent home on [place]. She states back pain seems to be back to baseline back pain, she has been drinking fluids, urine is concentrated but no longer thick and cloudy, no fever or chills. She has a Foley catheter and has follow up with Urologist on Friday. Urine culture was negative.   Reviews labs and studies with patient, explained we should wait to send her to surgeon until after she sees urologist , since symptoms do not seem secondary to gallstone  We will recheck labs to make sure electrolyte levels back to normal   Discussed albumin level, malnutrition and the importance of balance diet   Patient Active Problem List   Diagnosis Date Noted   Lumbar herniated disc 07/11/2021   Calculus of gallbladder without cholecystitis without obstruction 07/11/2021   Senile purpura ([place]) 06/05/2021   Atherosclerosis of aorta ([place]) 06/05/2021   Parkinson's disease ([place]) 06/05/2021   Chronic kidney disease (CKD) stage G3a/A2, moderately decreased glomerular filtration rate (GFR) between 45-59 mL/min/1.73 square meter and albuminuria creatinine ratio between 30-299 mg/g ([place]) 06/05/2021   Esophageal dysphagia    Gastric erythema    Columnar-lined esophagus    Moderate malnutrition ([place]) 09/26/2020   Centrilobular emphysema ([place]) 03/30/2020   History of prolonged Q-T interval on ECG 11/18/2019   GAD (generalized anxiety disorder) 10/13/2019   MDD (major depressive disorder), recurrent episode, moderate ([place]) 10/13/2019   At risk for long QT syndrome 10/13/2019   Nonrheumatic mitral valve regurgitation  05/04/2019   Benign neoplasm of descending colon    Polyp of sigmoid colon    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   Polyneuropathy 10/21/2014   Chronic venous insufficiency 10/05/2014   Bilateral leg edema 08/16/2014   Major depression in partial remission ([place]) 08/16/2014   Acid reflux 08/16/2014   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   Asthma, moderate persistent 08/16/2014   Carpal tunnel syndrome 08/16/2014   Cervical pain 08/16/2014   CAFL (chronic airflow limitation) ([place]) 08/16/2014   Type 2 diabetes mellitus with peripheral neuropathy ([place]) 08/16/2014   Diabetes mellitus type 2, insulin dependent ([place]) 08/16/2014   Dyslipidemia 08/16/2014   Tobacco use disorder 08/16/2014   Essential (primary) hypertension 08/16/2014   Benign neoplasm of stomach 08/16/2014   Gout 08/16/2014   HLD (hyperlipidemia) 08/16/2014   Low back pain 08/16/2014   Lumbar radiculopathy 08/16/2014   Headache, migraine 08/16/2014   Arthralgia of multiple joints 08/16/2014   Avitaminosis D 08/16/2014   Primary osteoarthritis of both knees 06/22/2014   Benign essential tremor 10/02/2013   Cervical dystonia 10/02/2013   Chronic diastolic heart failure ([place]) 11/16/2012   Chronic pain 11/13/2012    Past Surgical History:  Procedure Laterality Date   CARPAL TUNNEL RELEASE Bilateral    x2 right, 1x on left   COLONOSCOPY     COLONOSCOPY WITH PROPOFOL N/A 04/25/2017   Procedure: COLONOSCOPY WITH PROPOFOL;  Surgeon: [name], MD;  Location: [place];  Service: Endoscopy;  Laterality: N/A;  diabetic-oral med   DILATION AND CURETTAGE OF UTERUS  ESOPHAGOGASTRODUODENOSCOPY (EGD) WITH PROPOFOL N/A 01/10/2021   Procedure: ESOPHAGOGASTRODUODENOSCOPY (EGD) WITH PROPOFOL;  Surgeon: Virgel Manifold, MD;  Location: [place];  Service: Endoscopy;  Laterality: N/A;  Diabetic   EXTERNAL EAR SURGERY Left    x2   POLYPECTOMY  04/25/2017   Procedure: POLYPECTOMY INTESTINAL;   Surgeon: [name], MD;  Location: [place];  Service: Endoscopy;;   SPINE SURGERY     herniated disc   TUBAL LIGATION      Family History  Problem Relation Age of Onset   Emphysema Mother    Anxiety disorder Mother    Stroke Father    Throat cancer Father    Multiple sclerosis Daughter    Bipolar disorder Daughter    Cervical cancer Daughter    Bipolar disorder Daughter    Drug abuse Daughter    Lung cancer Maternal Aunt    Lung cancer Maternal Uncle    Lung cancer Maternal Grandmother    Lung cancer Maternal Grandfather     Social History   Tobacco Use   Smoking status: Every Day    Packs/day: 1.00    Years: 41.00    Pack years: 41.00    Types: Cigarettes    Start date: 05/19/1977   Smokeless tobacco: Never  Substance Use Topics   Alcohol use: No    Alcohol/week: 0.0 standard drinks     Current Outpatient Medications:    blood glucose meter kit and supplies KIT, Dispense accuchek aviva plus; can change if needed e11.9, Disp: 1 each, Rfl: 0   budesonide-formoterol (SYMBICORT) 160-4.5 MCG/ACT inhaler, Inhale 2 puffs into the lungs in the morning and at bedtime., Disp: , Rfl:    busPIRone (BUSPAR) 7.5 MG tablet, Take 7.5 mg by mouth 2 (two) times daily., Disp: , Rfl:    cefdinir (OMNICEF) 300 MG capsule, Take 1 capsule (300 mg total) by mouth 2 (two) times daily for 10 days., Disp: 20 capsule, Rfl: 0   cetirizine (ZYRTEC) 10 MG tablet, Take 10 mg by mouth daily as needed for allergies., Disp: , Rfl:    colchicine 0.6 MG tablet, TAKE 1 TABLET BY MOUTH ONCE DAILY, Disp: 30 tablet, Rfl: 0   colchicine 0.6 MG tablet, Take by mouth., Disp: , Rfl:    doxycycline (VIBRAMYCIN) 100 MG capsule, Take 100 mg by mouth 2 (two) times daily., Disp: , Rfl:    DULoxetine (CYMBALTA) 30 MG capsule, TAKE 1 CAPSULE BY MOUTH ONCE DAILY., Disp: 90 capsule, Rfl: 1   Empagliflozin-metFORMIN HCl (SYNJARDY) 12.5-500 MG TABS, Take 1 tablet by mouth 2 (two) times daily., Disp: 180  tablet, Rfl: 1   furosemide (LASIX) 40 MG tablet, Take 1 tablet (40 mg total) by mouth daily as needed., Disp: 30 tablet, Rfl: 2   gabapentin (NEURONTIN) 600 MG tablet, Take 1,200 mg by mouth 3 (three) times daily., Disp: , Rfl:    glucose blood test strip, Use as directed to check blood glucose daily, Disp: 100 each, Rfl: 2   hydrOXYzine (ATARAX) 25 MG tablet, Take 25 mg by mouth 2 (two) times daily., Disp: , Rfl:    Insulin Pen Needle 32G X 6 MM MISC, 1 each by Does not apply route daily., Disp: 100 each, Rfl: 2   ipratropium-albuterol (DUONEB) 0.5-2.5 (3) MG/3ML SOLN, Inhale 3 mLs into the lungs every 6 (six) hours as needed., Disp: 360 mL, Rfl: 3   lidocaine (LIDODERM) 5 %, 1 patch every 12 (twelve) hours as needed., Disp: , Rfl:  lubiprostone (AMITIZA) 24 MCG capsule, Take by mouth 2 (two) times daily as needed., Disp: , Rfl:    montelukast (SINGULAIR) 10 MG tablet, Take 1 tablet by mouth at bedtime., Disp: , Rfl:    morphine (MS CONTIN) 30 MG 12 hr tablet, Take 30 mg by mouth every 12 (twelve) hours., Disp: , Rfl:    omeprazole (PRILOSEC) 40 MG capsule, Take 1 capsule (40 mg total) by mouth daily., Disp: 90 capsule, Rfl: 0   ondansetron (ZOFRAN-ODT) 4 MG disintegrating tablet, Take 1 tablet (4 mg total) by mouth every 8 (eight) hours as needed for up to 7 days for nausea or vomiting., Disp: 20 tablet, Rfl: 0   Oxycodone HCl 10 MG TABS, Take 10 mg by mouth 4 (four) times daily as needed. , Disp: , Rfl:    oxyCODONE-acetaminophen (PERCOCET) 10-325 MG tablet, Take 1 tablet by mouth 4 (four) times daily as needed., Disp: , Rfl:    promethazine (PHENERGAN) 25 MG tablet, Take 25 mg by mouth daily as needed., Disp: , Rfl:    rosuvastatin (CRESTOR) 5 MG tablet, Take 1 tablet (5 mg total) by mouth at bedtime., Disp: 90 tablet, Rfl: 1   theophylline (UNIPHYL) 400 MG 24 hr tablet, Take 1 tablet by mouth daily. , Disp: , Rfl:    theophylline (UNIPHYL) 400 MG 24 hr tablet, Take by mouth., Disp: , Rfl:     tiotropium (SPIRIVA) 18 MCG inhalation capsule, Place 1 capsule into inhaler and inhale daily. pm, Disp: , Rfl:    tiZANidine (ZANAFLEX) 4 MG tablet, Take 4 mg by mouth every 6 (six) hours as needed for muscle spasms., Disp: , Rfl:    traZODone (DESYREL) 100 MG tablet, Take 1 tablet (100 mg total) by mouth at bedtime., Disp: 90 tablet, Rfl: 1   Ubrogepant (UBRELVY) 100 MG TABS, Take 100 mg by mouth daily as needed. May repeat in 2 hours, Disp: , Rfl:    VENTOLIN HFA 108 (90 Base) MCG/ACT inhaler, INHALE 1 PUFF BY MOUTH AS NEEDED, Disp: 18 g, Rfl: 0   carbidopa-levodopa (SINEMET IR) 25-100 MG tablet, Take 1.5 tablets by mouth 3 (three) times daily., Disp: , Rfl:   Allergies  Allergen Reactions   Augmentin [Amoxicillin-Pot Clavulanate] Diarrhea   Penicillins Itching    I personally reviewed active problem list, medication list, allergies, family history, social history, health maintenance with the patient/caregiver today.   ROS  Ten systems reviewed and is negative except as mentioned in HPI   Objective  Vitals:   07/11/21 1542  BP: 122/70  Pulse: 96  Resp: 16  SpO2: 98%  Weight: 143 lb (64.9 kg)  Height: 5' 8"  (1.727 m)    Body mass index is 21.74 kg/m.  Physical Exam  Constitutional: Patient appears well-developed and malnourished  No distress.  HEENT: head atraumatic, normocephalic, pupils equal and reactive to light, neck supple, Cardiovascular: Normal rate, regular rhythm and normal heart sounds.  No murmur heard. No BLE edema. Pulmonary/Chest: Effort normal and breath sounds normal. No respiratory distress. Abdominal: Soft.  There is no tenderness.CVA negative Psychiatric: Patient has a normal mood and affect. behavior is normal. Judgment and thought content normal.   Recent Results (from the past 2160 hour(s))  HgB A1c     Status: Abnormal   Collection Time: 06/05/21  2:54 PM  Result Value Ref Range   Hgb A1c MFr Bld 6.6 (H) <5.7 % of total Hgb    Comment:  For someone without known diabetes, a hemoglobin A1c  value of 6.5% or greater indicates that they may have  diabetes and this should be confirmed with a follow-up  test. . For someone with known diabetes, a value <7% indicates  that their diabetes is well controlled and a value  greater than or equal to 7% indicates suboptimal  control. A1c targets should be individualized based on  duration of diabetes, age, comorbid conditions, and  other considerations. . Currently, no consensus exists regarding use of hemoglobin A1c for diagnosis of diabetes for children. .    Mean Plasma Glucose 143 mg/dL   eAG (mmol/L) 7.9 mmol/L  POCT Urinalysis Dipstick     Status: Abnormal   Collection Time: 07/06/21 10:31 AM  Result Value Ref Range   Color, UA Dark Yellow    Clarity, UA Cloudy    Glucose, UA Positive (A) Negative   Bilirubin, UA Small    Ketones, UA Negative    Spec Grav, UA 1.010 1.010 - 1.025   Blood, UA Large    pH, UA 5.0 5.0 - 8.0   Protein, UA Positive (A) Negative   Urobilinogen, UA 0.2 0.2 or 1.0 E.U./dL   Nitrite, UA Positive    Leukocytes, UA Moderate (2+) (A) Negative   Appearance Cloudy/Turbid    Odor Mal   Urine Culture     Status: None   Collection Time: 07/06/21 10:36 AM   Specimen: Urine  Result Value Ref Range   MICRO NUMBER: [id number]    SPECIMEN QUALITY: Adequate    Sample Source URINE    STATUS: FINAL    ISOLATE 1:      Mixed genital flora isolated. These superficial bacteria are not indicative of a urinary tract infection. No further organism identification is warranted on this specimen. If clinically indicated, recollect clean-catch, mid-stream urine and transfer  immediately to Urine Culture Transport Tube.   POCT Glucose (CBG)     Status: Abnormal   Collection Time: 07/06/21 11:11 AM  Result Value Ref Range   POC Glucose 109 (A) 70 - 99 mg/dl  Lipase, blood     Status: None   Collection Time: 07/06/21 11:44 AM  Result Value Ref Range   Lipase 17  11 - 51 U/L    Comment: Performed at [facility], [address] [place], [place] [address]  Comprehensive metabolic panel     Status: Abnormal   Collection Time: 07/06/21 11:44 AM  Result Value Ref Range   Sodium 133 (L) 135 - 145 mmol/L   Potassium 3.2 (L) 3.5 - 5.1 mmol/L   Chloride 94 (L) 98 - 111 mmol/L   CO2 28 22 - 32 mmol/L   Glucose, Bld 102 (H) 70 - 99 mg/dL    Comment: Glucose reference range applies only to samples taken after fasting for at least 8 hours.   BUN 21 (H) 6 - 20 mg/dL   Creatinine, Ser 1.13 (H) 0.44 - 1.00 mg/dL   Calcium 8.5 (L) 8.9 - 10.3 mg/dL   Total Protein 6.3 (L) 6.5 - 8.1 g/dL   Albumin 2.8 (L) 3.5 - 5.0 g/dL   AST 18 15 - 41 U/L   ALT 9 0 - 44 U/L   Alkaline Phosphatase 119 38 - 126 U/L   Total Bilirubin 0.7 0.3 - 1.2 mg/dL   GFR, Estimated 56 (L) >60 mL/min    Comment: (NOTE) Calculated using the CKD-EPI Creatinine Equation (2021)    Anion gap 11 5 - 15    Comment: Performed at [facility],  [place], [place] [address]  CBC     Status: None   Collection Time: 07/06/21 11:44 AM  Result Value Ref Range   WBC 7.9 4.0 - 10.5 K/uL   RBC 4.52 3.87 - 5.11 MIL/uL   Hemoglobin 13.9 12.0 - 15.0 g/dL   HCT 41.7 36.0 - 46.0 %   MCV 92.3 80.0 - 100.0 fL   MCH 30.8 26.0 - 34.0 pg   MCHC 33.3 30.0 - 36.0 g/dL   RDW 12.8 11.5 - 15.5 %   Platelets 341 150 - 400 K/uL   nRBC 0.0 0.0 - 0.2 %    Comment: Performed at [facility], [address] [place], [place] [address]  Urinalysis, Routine w reflex microscopic     Status: Abnormal   Collection Time: 07/06/21 11:44 AM  Result Value Ref Range   Color, Urine YELLOW (A) YELLOW   APPearance TURBID (A) CLEAR   Specific Gravity, Urine 1.010 1.005 - 1.030   pH 5.0 5.0 - 8.0   Glucose, UA >=500 (A) NEGATIVE mg/dL   Hgb urine dipstick SMALL (A) NEGATIVE   Bilirubin Urine NEGATIVE NEGATIVE   Ketones, ur NEGATIVE NEGATIVE mg/dL   Protein, ur 30 (A) NEGATIVE mg/dL    Nitrite NEGATIVE NEGATIVE   Leukocytes,Ua LARGE (A) NEGATIVE   RBC / HPF 21-50 0 - 5 RBC/hpf   WBC, UA >50 (H) 0 - 5 WBC/hpf   Bacteria, UA MANY (A) NONE SEEN   Squamous Epithelial / LPF 21-50 0 - 5   WBC Clumps PRESENT    Budding Yeast PRESENT     Comment: Performed at [facility], [address], [place], [place] [address]  Urine Culture     Status: None   Collection Time: 07/06/21 11:44 AM   Specimen: Urine, Clean Catch  Result Value Ref Range   Specimen Description      URINE, CLEAN CATCH Performed at [facility], [address] [place], [place] [address]    Special Requests      NONE Performed at [facility], [address] [place], [place] [address]    Culture      NO GROWTH Performed at [facility], [place] [address], [place], [place] [address]    Report Status 07/07/2021 FINAL   Troponin I (High Sensitivity)     Status: None   Collection Time: 07/06/21 11:44 AM  Result Value Ref Range   Troponin I (High Sensitivity) 3 <18 ng/L    Comment: (NOTE) Elevated high sensitivity troponin I (hsTnI) values and significant  changes across serial measurements may suggest ACS but many other  chronic and acute conditions are known to elevate hsTnI results.  Refer to the "Links" section for chest pain algorithms and additional  guidance. Performed at [facility], [address] [place], [place] [address]   Brain natriuretic peptide     Status: None   Collection Time: 07/06/21 11:44 AM  Result Value Ref Range   B Natriuretic Peptide 30.9 0.0 - 100.0 pg/mL    Comment: Performed at [facility], [address] [place], [place] [address]    PHQ2/9:    07/11/2021    3:42 PM 07/06/2021   10:20 AM 06/05/2021    1:57 PM 05/08/2021   12:55 PM 01/31/2021    1:59 PM  Depression screen PHQ 2/9  Decreased Interest 3 1 3  0 3  Down, Depressed, Hopeless 3 3 3 3 3   PHQ -  2 Score 6 4 6 3 6   Altered sleeping 3 1 3 1 3    Tired, decreased energy 3 2 3 1 3   Change in appetite 0 3 2 0 3  Feeling bad or failure about yourself  0 0 0 0 0  Trouble concentrating 3 2 3  0 2  Moving slowly or fidgety/restless 0 0 0 0 2  Suicidal thoughts 0 0 0 0 0  PHQ-9 Score 15 12 17 5 19   Difficult doing work/chores  Somewhat difficult  Somewhat difficult Not difficult at all    phq 9 is positive   Fall Risk:    07/11/2021    3:42 PM 07/06/2021   10:20 AM 06/05/2021    1:57 PM 05/08/2021   12:55 PM 01/31/2021    2:23 PM  Fall Risk   Falls in the past year? 1 1 1 1 1   Number falls in past yr: 1 1 0 0 1  Injury with Fall? 1 1 1 1  0  Risk for fall due to : No Fall Risks History of fall(s) Impaired balance/gait Impaired balance/gait History of fall(s)  Follow up Falls prevention discussed Education provided;Falls evaluation completed;Falls prevention discussed Falls prevention discussed Falls prevention discussed Falls prevention discussed      Functional Status Survey: Is the patient deaf or have difficulty hearing?: Yes Does the patient have difficulty seeing, even when wearing glasses/contacts?: No Does the patient have difficulty concentrating, remembering, or making decisions?: Yes Does the patient have difficulty walking or climbing stairs?: Yes Does the patient have difficulty dressing or bathing?: No Does the patient have difficulty doing errands alone such as visiting a doctor's office or shopping?: No    Assessment & Plan  1. Hyponatremia  - COMPLETE METABOLIC PANEL WITH GFR  2. Hypocalcemia  - COMPLETE METABOLIC PANEL WITH GFR  3. Moderate malnutrition (HCC)  - COMPLETE METABOLIC PANEL WITH GFR  4. Hospital discharge follow-up   5. Lumbar herniated disc   6. Calculus of gallbladder without cholecystitis without obstruction

## 2021-07-11 ENCOUNTER — Encounter: Payer: Self-pay | Admitting: Family Medicine

## 2021-07-11 ENCOUNTER — Ambulatory Visit (INDEPENDENT_AMBULATORY_CARE_PROVIDER_SITE_OTHER): Payer: Medicare Other | Admitting: Family Medicine

## 2021-07-11 VITALS — BP 122/70 | HR 96 | Resp 16 | Ht 68.0 in | Wt 143.0 lb

## 2021-07-11 DIAGNOSIS — M5126 Other intervertebral disc displacement, lumbar region: Secondary | ICD-10-CM

## 2021-07-11 DIAGNOSIS — E871 Hypo-osmolality and hyponatremia: Secondary | ICD-10-CM | POA: Diagnosis not present

## 2021-07-11 DIAGNOSIS — Z09 Encounter for follow-up examination after completed treatment for conditions other than malignant neoplasm: Secondary | ICD-10-CM

## 2021-07-11 DIAGNOSIS — E440 Moderate protein-calorie malnutrition: Secondary | ICD-10-CM

## 2021-07-11 DIAGNOSIS — K8020 Calculus of gallbladder without cholecystitis without obstruction: Secondary | ICD-10-CM | POA: Insufficient documentation

## 2021-07-12 LAB — COMPLETE METABOLIC PANEL WITH GFR
AG Ratio: 1 (calc) (ref 1.0–2.5)
ALT: 12 U/L (ref 6–29)
AST: 15 U/L (ref 10–35)
Albumin: 3.1 g/dL — ABNORMAL LOW (ref 3.6–5.1)
Alkaline phosphatase (APISO): 117 U/L (ref 37–153)
BUN: 13 mg/dL (ref 7–25)
CO2: 31 mmol/L (ref 20–32)
Calcium: 8.4 mg/dL — ABNORMAL LOW (ref 8.6–10.4)
Chloride: 99 mmol/L (ref 98–110)
Creat: 0.83 mg/dL (ref 0.50–1.05)
Globulin: 3 g/dL (calc) (ref 1.9–3.7)
Glucose, Bld: 135 mg/dL — ABNORMAL HIGH (ref 65–99)
Potassium: 3.4 mmol/L — ABNORMAL LOW (ref 3.5–5.3)
Sodium: 138 mmol/L (ref 135–146)
Total Bilirubin: 0.3 mg/dL (ref 0.2–1.2)
Total Protein: 6.1 g/dL (ref 6.1–8.1)
eGFR: 81 mL/min/{1.73_m2} (ref >=60)

## 2021-07-19 ENCOUNTER — Ambulatory Visit: Payer: Medicare Other | Admitting: Urology

## 2021-07-19 ENCOUNTER — Ambulatory Visit (INDEPENDENT_AMBULATORY_CARE_PROVIDER_SITE_OTHER): Payer: Medicare Other | Admitting: Urology

## 2021-07-19 ENCOUNTER — Encounter: Payer: Self-pay | Admitting: Urology

## 2021-07-19 VITALS — BP 106/89 | HR 118 | Ht 68.0 in | Wt 143.0 lb

## 2021-07-19 DIAGNOSIS — R339 Retention of urine, unspecified: Secondary | ICD-10-CM

## 2021-07-19 MED ORDER — SULFAMETHOXAZOLE-TRIMETHOPRIM 800-160 MG PO TABS
1.0000 | ORAL_TABLET | Freq: Once | ORAL | Status: AC
  Administered 2021-07-19: 1 via ORAL

## 2021-07-19 NOTE — Progress Notes (Signed)
07/19/21 3:10 PM   Tricia [name] Aug 12, 1960 [id number]  Referring provider:  [name], MD [address] [place],  [place] [address]    HPI: Tricia [name] is a 61 y.o.female who presents today for further evaluation of urinary retention .   She was seen in the ED on 07/06/2021. She was noted to have some generalized abdominal pain that was ongoing for 3 days with significantly worse pain th night before ED visit in her right upper quadrant. She saw her PCP and was diagnosed with UTI and sent to the ER to rule out any underlying issues. UA showed small hgb, large leukocytes, >50 WBCs, and many bacteria. Culture showed no growth.   CT abdomen and pelvis visualized lower Lower pole left renal 1.5 cm fluid density lesion is consistent with a cyst. Punctate lower pole left renal collecting system calculi. Abnormal wall thickening enhancement of both renal pelves and ureters diffusely. Heterogeneous enhancement of the right greater than left kidneys on delayed imaging.The bladder wall is moderately thickened with mucosal hyperenhancement. Mild bladder distension.   There was concern for retention with distended bladder seen on CT. She attempted to urinate but even after voiding her PVR was over 500 cc. A foley was placed with return of 700 cc of urine.  She is doing well today. She reports that she has to bare down to urinate at times prior to this.  She has no personal history of frequent UTIs prior to recently as well as no personal history of urinary retention.  She does have occasional issues with constipation.  Around the time of retention, she was dealing with lower extremity edema and taking extra doses of Lasix.  She does mention today that she has had several urinalysis which were suspicious for infection associated with her urinary symptoms but the cultures were negative.  She does have a smoking history.  She is also worried about debris which  sometimes plugged her catheter tubing.   PMH: Past Medical History:  Diagnosis Date   Anxiety    Arthritis    joints and hands/ knees   Asthma    uses inhaler   Benign essential tremor    head   Cervical dystonia    neck pain   Cholesteatoma of left ear    x2   COPD (chronic obstructive pulmonary disease) (HCC)    Cough    Depression    Diabetes mellitus without complication (HCC)    type 2   Diastolic dysfunction    Dyspnea    Dysrhythmia    diastolic dysfunction   GERD (gastroesophageal reflux disease)    Headache    migraines/ one per week   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   Hyperlipidemia    Hypertension    Motion sickness    boat   Neuromuscular disorder ([place])    neuropathy feet and hands( nerve damage)   Wears dentures    upper and lower    Surgical History: Past Surgical History:  Procedure Laterality Date   CARPAL TUNNEL RELEASE Bilateral    x2 right, 1x on left   COLONOSCOPY     COLONOSCOPY WITH PROPOFOL N/A 04/25/2017   Procedure: COLONOSCOPY WITH PROPOFOL;  Surgeon: [name], MD;  Location: [facility];  Service: Endoscopy;  Laterality: N/A;  diabetic-oral med   DILATION AND CURETTAGE OF UTERUS     ESOPHAGOGASTRODUODENOSCOPY (EGD) WITH PROPOFOL N/A 01/10/2021   Procedure: ESOPHAGOGASTRODUODENOSCOPY (EGD)  WITH PROPOFOL;  Surgeon: Virgel Manifold, MD;  Location: [facility];  Service: Endoscopy;  Laterality: N/A;  Diabetic   EXTERNAL EAR SURGERY Left    x2   POLYPECTOMY  04/25/2017   Procedure: POLYPECTOMY INTESTINAL;  Surgeon: [name], MD;  Location: [facility];  Service: Endoscopy;;   SPINE SURGERY     herniated disc   TUBAL LIGATION      Home Medications:  Allergies as of 07/19/2021       Reactions   Augmentin [amoxicillin-pot Clavulanate] Diarrhea   Penicillins Itching        Medication List        Accurate as of Jul 19, 2021  3:10 PM. If you have any questions, ask your nurse or  doctor.          blood glucose meter kit and supplies Kit Dispense accuchek aviva plus; can change if needed e11.9   budesonide-formoterol 160-4.5 MCG/ACT inhaler Commonly known as: SYMBICORT Inhale 2 puffs into the lungs in the morning and at bedtime.   busPIRone 7.5 MG tablet Commonly known as: BUSPAR Take 7.5 mg by mouth 2 (two) times daily.   carbidopa-levodopa 25-100 MG tablet Commonly known as: SINEMET IR Take 1.5 tablets by mouth 3 (three) times daily.   cetirizine 10 MG tablet Commonly known as: ZYRTEC Take 10 mg by mouth daily as needed for allergies.   colchicine 0.6 MG tablet Take by mouth.   colchicine 0.6 MG tablet TAKE 1 TABLET BY MOUTH ONCE DAILY   doxycycline 100 MG capsule Commonly known as: VIBRAMYCIN Take 100 mg by mouth 2 (two) times daily.   DULoxetine 30 MG capsule Commonly known as: CYMBALTA TAKE 1 CAPSULE BY MOUTH ONCE DAILY.   furosemide 40 MG tablet Commonly known as: LASIX Take 1 tablet (40 mg total) by mouth daily as needed.   gabapentin 600 MG tablet Commonly known as: NEURONTIN Take 1,200 mg by mouth 3 (three) times daily.   glucose blood test strip Use as directed to check blood glucose daily   hydrOXYzine 25 MG tablet Commonly known as: ATARAX Take 25 mg by mouth 2 (two) times daily.   Insulin Pen Needle 32G X 6 MM Misc 1 each by Does not apply route daily.   ipratropium-albuterol 0.5-2.5 (3) MG/3ML Soln Commonly known as: DuoNeb Inhale 3 mLs into the lungs every 6 (six) hours as needed.   lidocaine 5 % Commonly known as: LIDODERM 1 patch every 12 (twelve) hours as needed.   lubiprostone 24 MCG capsule Commonly known as: AMITIZA Take by mouth 2 (two) times daily as needed.   montelukast 10 MG tablet Commonly known as: SINGULAIR Take 1 tablet by mouth at bedtime.   morphine 30 MG 12 hr tablet Commonly known as: MS CONTIN Take 30 mg by mouth every 12 (twelve) hours.   omeprazole 40 MG capsule Commonly known  as: PRILOSEC Take 1 capsule (40 mg total) by mouth daily.   Oxycodone HCl 10 MG Tabs Take 10 mg by mouth 4 (four) times daily as needed.   oxyCODONE-acetaminophen 10-325 MG tablet Commonly known as: PERCOCET Take 1 tablet by mouth 4 (four) times daily as needed.   promethazine 25 MG tablet Commonly known as: PHENERGAN Take 25 mg by mouth daily as needed.   rosuvastatin 5 MG tablet Commonly known as: CRESTOR Take 1 tablet (5 mg total) by mouth at bedtime.   Synjardy 12.5-500 MG Tabs Generic drug: Empagliflozin-metFORMIN HCl Take 1 tablet by mouth 2 (two)  times daily.   theophylline 400 MG 24 hr tablet Commonly known as: UNIPHYL Take 1 tablet by mouth daily.   theophylline 400 MG 24 hr tablet Commonly known as: UNIPHYL Take by mouth.   tiotropium 18 MCG inhalation capsule Commonly known as: SPIRIVA Place 1 capsule into inhaler and inhale daily. pm   tiZANidine 4 MG tablet Commonly known as: ZANAFLEX Take 4 mg by mouth every 6 (six) hours as needed for muscle spasms.   traZODone 100 MG tablet Commonly known as: DESYREL Take 1 tablet (100 mg total) by mouth at bedtime.   Ubrelvy 100 MG Tabs Generic drug: Ubrogepant Take 100 mg by mouth daily as needed. May repeat in 2 hours   Ventolin HFA 108 (90 Base) MCG/ACT inhaler Generic drug: albuterol INHALE 1 PUFF BY MOUTH AS NEEDED        Allergies:  Allergies  Allergen Reactions   Augmentin [Amoxicillin-Pot Clavulanate] Diarrhea   Penicillins Itching    Family History: Family History  Problem Relation Age of Onset   Emphysema Mother    Anxiety disorder Mother    Stroke Father    Throat cancer Father    Multiple sclerosis Daughter    Bipolar disorder Daughter    Cervical cancer Daughter    Bipolar disorder Daughter    Drug abuse Daughter    Lung cancer Maternal Aunt    Lung cancer Maternal Uncle    Lung cancer Maternal Grandmother    Lung cancer Maternal Grandfather     Social History:  reports that  she has been smoking cigarettes. She started smoking about 44 years ago. She has a 41.00 pack-year smoking history. She has never used smokeless tobacco. She reports that she does not drink alcohol and does not use drugs.   Physical Exam: BP 106/89   Pulse (!) 118   Ht 5' 8"  (1.727 m)   Wt 143 lb (64.9 kg)   BMI 21.74 kg/m   Constitutional:  Alert and oriented, No acute distress.  Accompanied by her sister today. HEENT: [place] AT, moist mucus membranes.  Trachea midline, no masses. Cardiovascular: No clubbing, cyanosis, or edema. Respiratory: Normal respiratory effort, no increased work of breathing. Skin: No rashes, bruises or suspicious lesions. Neurologic: Grossly intact, no focal deficits, moving all 4 extremities. Psychiatric: Normal mood and affect.  Laboratory Data: Lab Results  Component Value Date   CREATININE 0.83 07/11/2021   Lab Results  Component Value Date   HGBA1C 6.6 (H) 06/05/2021    Pertinent Imaging: CLINICAL DATA:  Abdominal pain starting 3 weeks ago. Nausea and vomiting.   EXAM: CT ABDOMEN AND PELVIS WITH CONTRAST   TECHNIQUE: Multidetector CT imaging of the abdomen and pelvis was performed using the standard protocol following bolus administration of intravenous contrast.   RADIATION DOSE REDUCTION: This exam was performed according to the departmental dose-optimization program which includes automated exposure control, adjustment of the mA and/or kV according to patient size and/or use of iterative reconstruction technique.   CONTRAST:  144m OMNIPAQUE IOHEXOL 300 MG/ML  SOLN   COMPARISON:  02/23/2013 right upper quadrant ultrasound. No prior CT.   FINDINGS: Lower chest: Significantly worsened bibasilar aeration since 08/25/2020 chest CT. Areas of bilateral lower lung peribronchovascular ground-glass nodularity with mild bilateral lower lobe bronchial wall thickening. Normal heart size without pericardial or pleural effusion.    Hepatobiliary: Normal liver. Gallstones up to 1.0 cm without acute cholecystitis or biliary duct dilatation.   Pancreas: Pancreatic atrophy. No duct dilatation or acute inflammation.  Spleen: Normal in size, without focal abnormality.   Adrenals/Urinary Tract: Normal adrenal glands. Lower pole left renal 1.5 cm fluid density lesion is consistent with a cyst . In the absence of clinically indicated signs/symptoms require(s) no independent follow-up.   Punctate lower pole left renal collecting system calculi. Abnormal wall thickening enhancement of both renal pelves and ureters diffusely. Heterogeneous enhancement of the right greater than left kidneys on delayed imaging.   The bladder wall is moderately thickened with mucosal hyperenhancement. Mild bladder distension.   Stomach/Bowel: Proximal gastric underdistention. Colonic stool burden suggests constipation. Underdistended right-sided colon. Normal terminal ileum. Appendix not visualized. Solid stool like material within right upper pelvic small bowel loops, including on 42/2. No focal transition point identified.   Vascular/Lymphatic: Aortic atherosclerosis. No abdominal adenopathy. Right external iliac borderline enlarged 1.0 cm node on 66/2. Left external iliac 1.2 cm node on 60/2.   Reproductive: Normal uterus and adnexa.   Other: No significant free fluid.  Mild pelvic floor laxity.   Musculoskeletal: No acute osseous abnormality.   IMPRESSION: 1. Mild bladder distension with wall thickening and mucosal hyperenhancement, consistent with cystitis. Concurrent ureteric and renal pelvic urothelial thickening and hyperenhancement, consistent with ascending infection. Mild right greater than left pyelonephritis. 2. Cholelithiasis 3. Development of bibasilar infection, including atypical etiologies. 4. Possible constipation. Areas of solid, stool like material within small bowel are favored to be related to decreased  transit time/small bowel stasis. 5. Apparent right-sided colonic wall thickening is favored to be due to underdistention. Correlate with symptoms of colitis, felt less likely. 6. Mild pelvic adenopathy, favored to be reactive. 7. Left nephrolithiasis. 8.  Aortic Atherosclerosis (ICD10-I70.0).     Electronically Signed   By: Abigail Miyamoto M.D.   On: 07/06/2021 12:59    I have personally reviewed the images and agree with radiologist interpretation. '  Assessment & Plan:    Urinary retention  - She had a successful voiding trial today foley catheter was removed.  - Will treat her with 1 time does of Bactrim ppx today - Encouraged her that if she is unable to urinate today she should call here to clinic before 3pm.  -Suspect urinary retention is likely multifactorial, contributing factors likely include Lasix as well as possible underlying UTI  2.  Abnormal urinalysis - Will have her return in 1 week for PVR and UA. If UA is suspicious consider cystoscopy in light of her smoking history as well as suspicious appearing urines in the absence of documented bacterial infection  Follow-up 1 week for PVR/UA  Conley Rolls as a scribe for Hollice Espy, MD.,have documented all relevant documentation on the behalf of Hollice Espy, MD,as directed by  Hollice Espy, MD while in the presence of Hollice Espy, [place] [address], [place], [place] [address] [phone]

## 2021-07-19 NOTE — Progress Notes (Signed)
Fill and Pull Catheter Removal  Patient is present today for a catheter removal.  Patient was cleaned and prepped in a sterile fashion 163m of sterile water/ saline was instilled into the bladder when the patient felt the urge to urinate. ml of water was then drained from the balloon.  A 16FR foley cath was removed from the bladder no complications were noted .  Patient as then given some time to void on their own.  Patient can void  1565mon their own after some time.  Patient tolerated well.  Performed by: [place]

## 2021-07-25 ENCOUNTER — Ambulatory Visit: Payer: Medicare Other | Admitting: Physician Assistant

## 2021-07-28 ENCOUNTER — Encounter: Payer: Self-pay | Admitting: Physician Assistant

## 2021-08-14 ENCOUNTER — Other Ambulatory Visit: Payer: Self-pay | Admitting: Family Medicine

## 2021-08-14 DIAGNOSIS — E785 Hyperlipidemia, unspecified: Secondary | ICD-10-CM

## 2021-08-14 DIAGNOSIS — E1169 Type 2 diabetes mellitus with other specified complication: Secondary | ICD-10-CM

## 2021-08-14 DIAGNOSIS — F411 Generalized anxiety disorder: Secondary | ICD-10-CM

## 2021-08-14 DIAGNOSIS — F331 Major depressive disorder, recurrent, moderate: Secondary | ICD-10-CM

## 2021-08-18 NOTE — Progress Notes (Addendum)
Name: Tricia [name]   MRN: [id number]    DOB: 1960-05-07   Date:08/21/2021       Progress Note  Subjective  Chief Complaint  Follow Up  HPI  Protein malnutrition: weight loss, without dieting She went to see GI and had EGD, but she refused colonoscopy. She is due for mammogram and reminded her to schedule it, due for follow up with GI, she had lost 15 lbs earlier this year, lost another 2 lbs in the past 6 weeks. She continues to feel tired, trying to add salt to her diet since she had hyponatremia in May. She continues to have lack of appetite and food tastes bland. Explained importance of seeing GI - advised her to contact them back. Get mammogram, we will recheck labs today and place referral for chronic care management   She has multiple medical problems, struggling financially, depends on relatives to take her to doctor's office, also has food insecurity.    Patient Active Problem List   Diagnosis Date Noted   Lumbar herniated disc 07/11/2021   Calculus of gallbladder without cholecystitis without obstruction 07/11/2021   Senile purpura (HCC) 06/05/2021   Atherosclerosis of aorta (HCC) 06/05/2021   Parkinson's disease (HCC) 06/05/2021   Chronic kidney disease (CKD) stage G3a/A2, moderately decreased glomerular filtration rate (GFR) between 45-59 mL/min/1.73 square meter and albuminuria creatinine ratio between 30-299 mg/g (HCC) 06/05/2021   Esophageal dysphagia    Gastric erythema    Columnar-lined esophagus    Moderate malnutrition (HCC) 09/26/2020   Centrilobular emphysema (HCC) 03/30/2020   History of prolonged Q-T interval on ECG 11/18/2019   GAD (generalized anxiety disorder) 10/13/2019   MDD (major depressive disorder), recurrent episode, moderate (HCC) 10/13/2019   At risk for long QT syndrome 10/13/2019   Nonrheumatic mitral valve regurgitation 05/04/2019   Benign neoplasm of descending colon    Polyp of sigmoid colon    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    Polyneuropathy 10/21/2014   Chronic venous insufficiency 10/05/2014   Bilateral leg edema 08/16/2014   Major depression in partial remission (HCC) 08/16/2014   Acid reflux 08/16/2014   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   Asthma, moderate persistent 08/16/2014   Carpal tunnel syndrome 08/16/2014   Cervical pain 08/16/2014   CAFL (chronic airflow limitation) (HCC) 08/16/2014   Type 2 diabetes mellitus with peripheral neuropathy (HCC) 08/16/2014   Diabetes mellitus type 2, insulin dependent (HCC) 08/16/2014   Dyslipidemia 08/16/2014   Tobacco use disorder 08/16/2014   Essential (primary) hypertension 08/16/2014   Benign neoplasm of stomach 08/16/2014   Gout 08/16/2014   HLD (hyperlipidemia) 08/16/2014   Low back pain 08/16/2014   Lumbar radiculopathy 08/16/2014   Headache, migraine 08/16/2014   Arthralgia of multiple joints 08/16/2014   Avitaminosis D 08/16/2014   Primary osteoarthritis of both knees 06/22/2014   Benign essential tremor 10/02/2013   Cervical dystonia 10/02/2013   Chronic diastolic heart failure (HCC) 11/16/2012   Chronic pain 11/13/2012    Past Surgical History:  Procedure Laterality Date   CARPAL TUNNEL RELEASE Bilateral    x2 right, 1x on left   COLONOSCOPY     COLONOSCOPY WITH PROPOFOL N/A 04/25/2017   Procedure: COLONOSCOPY WITH PROPOFOL;  Surgeon: Midge Minium, MD;  Location: [facility];  Service: Endoscopy;  Laterality: N/A;  diabetic-oral med   DILATION AND CURETTAGE OF UTERUS     ESOPHAGOGASTRODUODENOSCOPY (EGD) WITH PROPOFOL N/A 01/10/2021   Procedure: ESOPHAGOGASTRODUODENOSCOPY (EGD) WITH PROPOFOL;  Surgeon: Pasty Spillers, MD;  Location:  [facility];  Service: Endoscopy;  Laterality: N/A;  Diabetic   EXTERNAL EAR SURGERY Left    x2   POLYPECTOMY  04/25/2017   Procedure: POLYPECTOMY INTESTINAL;  Surgeon: Midge Minium, MD;  Location: [facility];  Service: Endoscopy;;   SPINE SURGERY     herniated disc   TUBAL  LIGATION      Family History  Problem Relation Age of Onset   Emphysema Mother    Anxiety disorder Mother    Stroke Father    Throat cancer Father    Multiple sclerosis Daughter    Bipolar disorder Daughter    Cervical cancer Daughter    Bipolar disorder Daughter    Drug abuse Daughter    Lung cancer Maternal Aunt    Lung cancer Maternal Uncle    Lung cancer Maternal Grandmother    Lung cancer Maternal Grandfather     Social History   Tobacco Use   Smoking status: Every Day    Packs/day: 1.00    Years: 41.00    Total pack years: 41.00    Types: Cigarettes    Start date: 05/19/1977   Smokeless tobacco: Never  Substance Use Topics   Alcohol use: No    Alcohol/week: 0.0 standard drinks of alcohol     Current Outpatient Medications:    blood glucose meter kit and supplies KIT, Dispense accuchek aviva plus; can change if needed e11.9, Disp: 1 each, Rfl: 0   budesonide-formoterol (SYMBICORT) 160-4.5 MCG/ACT inhaler, Inhale 2 puffs into the lungs in the morning and at bedtime., Disp: , Rfl:    busPIRone (BUSPAR) 7.5 MG tablet, Take 7.5 mg by mouth 2 (two) times daily., Disp: , Rfl:    cetirizine (ZYRTEC) 10 MG tablet, Take 10 mg by mouth daily as needed for allergies., Disp: , Rfl:    colchicine 0.6 MG tablet, TAKE 1 TABLET BY MOUTH ONCE DAILY, Disp: 30 tablet, Rfl: 0   doxycycline (VIBRAMYCIN) 100 MG capsule, Take 100 mg by mouth 2 (two) times daily., Disp: , Rfl:    DULoxetine (CYMBALTA) 30 MG capsule, TAKE 1 CAPSULE BY MOUTH ONCE DAILY., Disp: 90 capsule, Rfl: 1   Empagliflozin-metFORMIN HCl (SYNJARDY) 12.5-500 MG TABS, Take 1 tablet by mouth 2 (two) times daily., Disp: 180 tablet, Rfl: 1   furosemide (LASIX) 40 MG tablet, Take 1 tablet (40 mg total) by mouth daily as needed., Disp: 30 tablet, Rfl: 2   gabapentin (NEURONTIN) 600 MG tablet, Take 1,200 mg by mouth 3 (three) times daily., Disp: , Rfl:    glucose blood test strip, Use as directed to check blood glucose daily,  Disp: 100 each, Rfl: 2   hydrOXYzine (ATARAX) 25 MG tablet, Take 25 mg by mouth 2 (two) times daily., Disp: , Rfl:    Insulin Pen Needle 32G X 6 MM MISC, 1 each by Does not apply route daily., Disp: 100 each, Rfl: 2   ipratropium-albuterol (DUONEB) 0.5-2.5 (3) MG/3ML SOLN, Inhale 3 mLs into the lungs every 6 (six) hours as needed., Disp: 360 mL, Rfl: 3   lidocaine (LIDODERM) 5 %, 1 patch every 12 (twelve) hours as needed., Disp: , Rfl:    lubiprostone (AMITIZA) 24 MCG capsule, Take by mouth 2 (two) times daily as needed., Disp: , Rfl:    montelukast (SINGULAIR) 10 MG tablet, Take 1 tablet by mouth at bedtime., Disp: , Rfl:    morphine (MS CONTIN) 30 MG 12 hr tablet, Take 30 mg by mouth every 12 (twelve) hours., Disp: ,  Rfl:    omeprazole (PRILOSEC) 40 MG capsule, Take 1 capsule (40 mg total) by mouth daily., Disp: 90 capsule, Rfl: 0   Oxycodone HCl 10 MG TABS, Take 10 mg by mouth 4 (four) times daily as needed. , Disp: , Rfl:    promethazine (PHENERGAN) 25 MG tablet, Take 25 mg by mouth daily as needed., Disp: , Rfl:    rosuvastatin (CRESTOR) 5 MG tablet, Take 1 tablet (5 mg total) by mouth at bedtime., Disp: 90 tablet, Rfl: 1   theophylline (UNIPHYL) 400 MG 24 hr tablet, Take 1 tablet by mouth daily. , Disp: , Rfl:    tiotropium (SPIRIVA) 18 MCG inhalation capsule, Place 1 capsule into inhaler and inhale daily. pm, Disp: , Rfl:    traZODone (DESYREL) 100 MG tablet, Take 1 tablet (100 mg total) by mouth at bedtime., Disp: 90 tablet, Rfl: 1   Ubrogepant (UBRELVY) 100 MG TABS, Take 100 mg by mouth daily as needed. May repeat in 2 hours, Disp: , Rfl:    VENTOLIN HFA 108 (90 Base) MCG/ACT inhaler, INHALE 1 PUFF BY MOUTH AS NEEDED, Disp: 18 g, Rfl: 0   carbidopa-levodopa (SINEMET IR) 25-100 MG tablet, Take 1.5 tablets by mouth 3 (three) times daily., Disp: , Rfl:    cyclobenzaprine (FLEXERIL) 10 MG tablet, Take 10 mg by mouth at bedtime., Disp: , Rfl:   Allergies  Allergen Reactions   Augmentin  [Amoxicillin-Pot Clavulanate] Diarrhea   Penicillins Itching    I personally reviewed active problem list, medication list, allergies, family history, social history, health maintenance with the patient/caregiver today.   ROS  Ten systems reviewed and is negative except as mentioned in HPI  Objective  Vitals:   08/21/21 1338  BP: 104/68  Pulse: 94  Resp: 16  SpO2: 99%  Weight: 140 lb (63.5 kg)  Height: 5\' 8"  (1.727 m)    Body mass index is 21.29 kg/m.  Physical Exam  Constitutional: Patient appears well-developed and malnourished , palpable bones ( shoulders/scapular area) and temporal waisting  No distress.  HEENT: head atraumatic, normocephalic, pupils equal and reactive to light, neck supple Cardiovascular: Normal rate, regular rhythm and normal heart sounds.  No murmur heard. 1 plus  BLE edema Pulmonary/Chest: Effort normal , bilateral wheezing and rhonchi. No respiratory distress. Abdominal: Soft.  There is no tenderness. Psychiatric: Patient has a normal mood and affect. behavior is normal. Judgment and thought content normal.   Recent Results (from the past 2160 hour(s))  HgB A1c     Status: Abnormal   Collection Time: 06/05/21  2:54 PM  Result Value Ref Range   Hgb A1c MFr Bld 6.6 (H) <5.7 % of total Hgb    Comment: For someone without known diabetes, a hemoglobin A1c value of 6.5% or greater indicates that they may have  diabetes and this should be confirmed with a follow-up  test. . For someone with known diabetes, a value <7% indicates  that their diabetes is well controlled and a value  greater than or equal to 7% indicates suboptimal  control. A1c targets should be individualized based on  duration of diabetes, age, comorbid conditions, and  other considerations. . Currently, no consensus exists regarding use of hemoglobin A1c for diagnosis of diabetes for children. .    Mean Plasma Glucose 143 mg/dL   eAG (mmol/L) 7.9 mmol/L  POCT Urinalysis  Dipstick     Status: Abnormal   Collection Time: 07/06/21 10:31 AM  Result Value Ref Range   Color, UA  Dark Yellow    Clarity, UA Cloudy    Glucose, UA Positive (A) Negative   Bilirubin, UA Small    Ketones, UA Negative    Spec Grav, UA 1.010 1.010 - 1.025   Blood, UA Large    pH, UA 5.0 5.0 - 8.0   Protein, UA Positive (A) Negative   Urobilinogen, UA 0.2 0.2 or 1.0 E.U./dL   Nitrite, UA Positive    Leukocytes, UA Moderate (2+) (A) Negative   Appearance Cloudy/Turbid    Odor Mal   Urine Culture     Status: None   Collection Time: 07/06/21 10:36 AM   Specimen: Urine  Result Value Ref Range   MICRO NUMBER: [id number]    SPECIMEN QUALITY: Adequate    Sample Source URINE    STATUS: FINAL    ISOLATE 1:      Mixed genital flora isolated. These superficial bacteria are not indicative of a urinary tract infection. No further organism identification is warranted on this specimen. If clinically indicated, recollect clean-catch, mid-stream urine and transfer  immediately to Urine Culture Transport Tube.   POCT Glucose (CBG)     Status: Abnormal   Collection Time: 07/06/21 11:11 AM  Result Value Ref Range   POC Glucose 109 (A) 70 - 99 mg/dl  Lipase, blood     Status: None   Collection Time: 07/06/21 11:44 AM  Result Value Ref Range   Lipase 17 11 - 51 U/L    Comment: Performed at [facility], [address] [place], [place] [address]  Comprehensive metabolic panel     Status: Abnormal   Collection Time: 07/06/21 11:44 AM  Result Value Ref Range   Sodium 133 (L) 135 - 145 mmol/L   Potassium 3.2 (L) 3.5 - 5.1 mmol/L   Chloride 94 (L) 98 - 111 mmol/L   CO2 28 22 - 32 mmol/L   Glucose, Bld 102 (H) 70 - 99 mg/dL    Comment: Glucose reference range applies only to samples taken after fasting for at least 8 hours.   BUN 21 (H) 6 - 20 mg/dL   Creatinine, Ser 1.19 (H) 0.44 - 1.00 mg/dL   Calcium 8.5 (L) 8.9 - 10.3 mg/dL   Total Protein 6.3 (L) 6.5 - 8.1 g/dL   Albumin 2.8 (L)  3.5 - 5.0 g/dL   AST 18 15 - 41 U/L   ALT 9 0 - 44 U/L   Alkaline Phosphatase 119 38 - 126 U/L   Total Bilirubin 0.7 0.3 - 1.2 mg/dL   GFR, Estimated 56 (L) >60 mL/min    Comment: (NOTE) Calculated using the CKD-EPI Creatinine Equation (2021)    Anion gap 11 5 - 15    Comment: Performed at [facility], [address] [place], [place] [address]  CBC     Status: None   Collection Time: 07/06/21 11:44 AM  Result Value Ref Range   WBC 7.9 4.0 - 10.5 K/uL   RBC 4.52 3.87 - 5.11 MIL/uL   Hemoglobin 13.9 12.0 - 15.0 g/dL   HCT 95.6 21.3 - 08.6 %   MCV 92.3 80.0 - 100.0 fL   MCH 30.8 26.0 - 34.0 pg   MCHC 33.3 30.0 - 36.0 g/dL   RDW 57.8 46.9 - 62.9 %   Platelets 341 150 - 400 K/uL   nRBC 0.0 0.0 - 0.2 %    Comment: Performed at [facility], [address], [place], [place] [address]  Urinalysis, Routine w reflex  microscopic     Status: Abnormal   Collection Time: 07/06/21 11:44 AM  Result Value Ref Range   Color, Urine YELLOW (A) YELLOW   APPearance TURBID (A) CLEAR   Specific Gravity, Urine 1.010 1.005 - 1.030   pH 5.0 5.0 - 8.0   Glucose, UA >=500 (A) NEGATIVE mg/dL   Hgb urine dipstick SMALL (A) NEGATIVE   Bilirubin Urine NEGATIVE NEGATIVE   Ketones, ur NEGATIVE NEGATIVE mg/dL   Protein, ur 30 (A) NEGATIVE mg/dL   Nitrite NEGATIVE NEGATIVE   Leukocytes,Ua LARGE (A) NEGATIVE   RBC / HPF 21-50 0 - 5 RBC/hpf   WBC, UA >50 (H) 0 - 5 WBC/hpf   Bacteria, UA MANY (A) NONE SEEN   Squamous Epithelial / LPF 21-50 0 - 5   WBC Clumps PRESENT    Budding Yeast PRESENT     Comment: Performed at [facility], [address], [place], [place] [address]  Urine Culture     Status: None   Collection Time: 07/06/21 11:44 AM   Specimen: Urine, Clean Catch  Result Value Ref Range   Specimen Description      URINE, CLEAN CATCH Performed at [facility], [address] [place], [place] [address]    Special Requests      NONE Performed at [facility], [address], [place], [place] [address]    Culture      NO GROWTH Performed at [facility], [address] [place], [place] [address]    Report Status 07/07/2021 FINAL   Troponin I (High Sensitivity)     Status: None   Collection Time: 07/06/21 11:44 AM  Result Value Ref Range   Troponin I (High Sensitivity) 3 <18 ng/L    Comment: (NOTE) Elevated high sensitivity troponin I (hsTnI) values and significant  changes across serial measurements may suggest ACS but many other  chronic and acute conditions are known to elevate hsTnI results.  Refer to the "Links" section for chest pain algorithms and additional  guidance. Performed at [facility], [address] [place], [place] [address]   Brain natriuretic peptide     Status: None   Collection Time: 07/06/21 11:44 AM  Result Value Ref Range   B Natriuretic Peptide 30.9 0.0 - 100.0 pg/mL    Comment: Performed at [facility], [address] [place], [place] [address]  COMPLETE METABOLIC PANEL WITH GFR     Status: Abnormal   Collection Time: 07/11/21  4:18 PM  Result Value Ref Range   Glucose, Bld 135 (H) 65 - 99 mg/dL    Comment: .            Fasting reference interval . For someone without known diabetes, a glucose value >125 mg/dL indicates that they may have diabetes and this should be confirmed with a follow-up test. .    BUN 13 7 - 25 mg/dL   Creat 0.34 7.42 - 5.95 mg/dL   eGFR 81 > OR = 60 GL/OVF/6.43P2    Comment: The eGFR is based on the CKD-EPI 2021 equation. To calculate  the new eGFR from a previous Creatinine or Cystatin C result, go to https://www.kidney.org/professionals/ kdoqi/gfr%5Fcalculator    BUN/Creatinine Ratio NOT APPLICABLE 6 - 22 (calc)   Sodium 138 135 - 146 mmol/L   Potassium 3.4 (L) 3.5 - 5.3 mmol/L   Chloride 99 98 - 110 mmol/L   CO2 31 20 - 32 mmol/L   Calcium 8.4 (L) 8.6 -  10.4 mg/dL   Total Protein 6.1 6.1 - 8.1 g/dL   Albumin 3.1 (L) 3.6 -  5.1 g/dL   Globulin 3.0 1.9 - 3.7 g/dL (calc)   AG Ratio 1.0 1.0 - 2.5 (calc)   Total Bilirubin 0.3 0.2 - 1.2 mg/dL   Alkaline phosphatase (APISO) 117 37 - 153 U/L   AST 15 10 - 35 U/L   ALT 12 6 - 29 U/L    PHQ2/9:    08/21/2021    1:42 PM 07/11/2021    3:42 PM 07/06/2021   10:20 AM 06/05/2021    1:57 PM 05/08/2021   12:55 PM  Depression screen PHQ 2/9  Decreased Interest 3 3 1 3  0  Down, Depressed, Hopeless 3 3 3 3 3   PHQ - 2 Score 6 6 4 6 3   Altered sleeping 0 3 1 3 1   Tired, decreased energy 3 3 2 3 1   Change in appetite 0 0 3 2 0  Feeling bad or failure about yourself  0 0 0 0 0  Trouble concentrating 1 3 2 3  0  Moving slowly or fidgety/restless 0 0 0 0 0  Suicidal thoughts 0 0 0 0 0  PHQ-9 Score 10 15 12 17 5   Difficult doing work/chores   Somewhat difficult  Somewhat difficult    phq 9 is positive   Fall Risk:    08/21/2021    1:37 PM 07/11/2021    3:42 PM 07/06/2021   10:20 AM 06/05/2021    1:57 PM 05/08/2021   12:55 PM  Fall Risk   Falls in the past year? 1 1 1 1 1   Number falls in past yr: 1 1 1  0 0  Injury with Fall? 1 1 1 1 1   Risk for fall due to : No Fall Risks No Fall Risks History of fall(s) Impaired balance/gait Impaired balance/gait  Follow up Falls prevention discussed Falls prevention discussed Education provided;Falls evaluation completed;Falls prevention discussed Falls prevention discussed Falls prevention discussed      Functional Status Survey: Is the patient deaf or have difficulty hearing?: Yes Does the patient have difficulty seeing, even when wearing glasses/contacts?: No Does the patient have difficulty concentrating, remembering, or making decisions?: Yes Does the patient have difficulty walking or climbing stairs?: Yes Does the patient have difficulty dressing or bathing?: No Does the patient have difficulty doing errands alone such as visiting a doctor's office or shopping?: No    Assessment & Plan  1. Moderate malnutrition  (HCC)  - AMB Referral to [facility]ordin[facility]  2. Hyponatremia  - AMB Referral to [facility] Coordin[facility]  3. Other fatigue  Recheck labs   4. Breast cancer screening by mammogram  - MM 3D SCREEN BREAST BILATERAL; Future

## 2021-08-21 ENCOUNTER — Encounter: Payer: Self-pay | Admitting: Family Medicine

## 2021-08-21 ENCOUNTER — Ambulatory Visit (INDEPENDENT_AMBULATORY_CARE_PROVIDER_SITE_OTHER): Payer: Medicare Other | Admitting: Family Medicine

## 2021-08-21 VITALS — BP 104/68 | HR 94 | Resp 16 | Ht 68.0 in | Wt 140.0 lb

## 2021-08-21 DIAGNOSIS — R5383 Other fatigue: Secondary | ICD-10-CM

## 2021-08-21 DIAGNOSIS — E871 Hypo-osmolality and hyponatremia: Secondary | ICD-10-CM | POA: Diagnosis not present

## 2021-08-21 DIAGNOSIS — Z1231 Encounter for screening mammogram for malignant neoplasm of breast: Secondary | ICD-10-CM | POA: Diagnosis not present

## 2021-08-21 DIAGNOSIS — E440 Moderate protein-calorie malnutrition: Secondary | ICD-10-CM | POA: Diagnosis not present

## 2021-08-22 LAB — COMPLETE METABOLIC PANEL WITH GFR
AG Ratio: 1.1 (calc) (ref 1.0–2.5)
ALT: 16 U/L (ref 6–29)
AST: 29 U/L (ref 10–35)
Albumin: 2.8 g/dL — ABNORMAL LOW (ref 3.6–5.1)
Alkaline phosphatase (APISO): 149 U/L (ref 37–153)
BUN/Creatinine Ratio: 9 (calc) (ref 6–22)
BUN: 10 mg/dL (ref 7–25)
CO2: 27 mmol/L (ref 20–32)
Calcium: 7.9 mg/dL — ABNORMAL LOW (ref 8.6–10.4)
Chloride: 99 mmol/L (ref 98–110)
Creat: 1.06 mg/dL — ABNORMAL HIGH (ref 0.50–1.05)
Globulin: 2.6 g/dL (calc) (ref 1.9–3.7)
Glucose, Bld: 74 mg/dL (ref 65–99)
Potassium: 3.8 mmol/L (ref 3.5–5.3)
Sodium: 133 mmol/L — ABNORMAL LOW (ref 135–146)
Total Bilirubin: 0.4 mg/dL (ref 0.2–1.2)
Total Protein: 5.4 g/dL — ABNORMAL LOW (ref 6.1–8.1)
eGFR: 60 mL/min/{1.73_m2} (ref >=60)

## 2021-08-23 ENCOUNTER — Telehealth: Payer: Self-pay

## 2021-08-23 ENCOUNTER — Other Ambulatory Visit: Payer: Self-pay

## 2021-08-23 NOTE — Telephone Encounter (Signed)
Left voicemail to have patient return for lab draw for an add-on calcium test per Dr. [name].

## 2021-08-23 NOTE — Progress Notes (Signed)
Lab added

## 2021-08-25 ENCOUNTER — Ambulatory Visit
Admission: RE | Admit: 2021-08-25 | Discharge: 2021-08-25 | Disposition: A | Discharge status: Home / Self Care | Payer: Medicare Other | Source: Ambulatory Visit | Attending: Acute Care | Admitting: Acute Care

## 2021-08-25 DIAGNOSIS — Z87891 Personal history of nicotine dependence: Secondary | ICD-10-CM | POA: Diagnosis present

## 2021-08-25 DIAGNOSIS — F17210 Nicotine dependence, cigarettes, uncomplicated: Secondary | ICD-10-CM | POA: Diagnosis present

## 2021-08-28 ENCOUNTER — Telehealth: Payer: Self-pay | Admitting: Acute Care

## 2021-08-28 DIAGNOSIS — Z87891 Personal history of nicotine dependence: Secondary | ICD-10-CM

## 2021-08-28 DIAGNOSIS — F17210 Nicotine dependence, cigarettes, uncomplicated: Secondary | ICD-10-CM

## 2021-08-28 DIAGNOSIS — Z122 Encounter for screening for malignant neoplasm of respiratory organs: Secondary | ICD-10-CM

## 2021-08-28 NOTE — Telephone Encounter (Signed)
IMPRESSION: 1. Lung-RADS 0, incomplete. Additional lung cancer screening CT images/or comparison to prior chest CT examinations is needed.   Extensive mid and lower lung zone predominant peribronchovascular ground-glass, nodularity and endobronchial debris, new from prior and most indicative of an infectious bronchiolitis. Recommend repeat low-dose lung cancer screening CT in 4-6 weeks, after appropriate therapy.   These results will be called to the ordering clinician or representative by the Radiologist Assistant, and communication documented in the PACS or [employer]. 2. Marked hepatic steatosis. 3. Aortic atherosclerosis (ICD10-I70.0). Coronary artery calcification. 4.  Emphysema (ICD10-J43.9).     Electronically Signed   By: Lorin Picket M.D.   On: 08/28/2021 13:33

## 2021-08-30 NOTE — Telephone Encounter (Signed)
I have attempted to call the patient with the results of their  Low Dose CT Chest Lung cancer screening scan. There was no answer. I have left a HIPPA compliant VM requesting the patient call the office for the scan results. I included the office contact information in the message. We will await their return call. If no return call we will continue to call until patient is contacted.    Tricia [name], if this patient calls back, please  let her know her scan was not able to be read because of mucus and what looks like infection. Can we add her to my schedule 7/25 at either 10:30 or 11:00 ( Can be a video visit if necessary) so we can go over her scan and see if she needs to be treated before we repeat her CT?  Thanks so much

## 2021-08-31 NOTE — Telephone Encounter (Signed)
LDCT order placed for 6 weeks.

## 2021-08-31 NOTE — Telephone Encounter (Signed)
I have called the patient with the results of her low dose CT Chest. LR 0 due to Extensive mid and lower lung zone predominant peribronchovascular ground-glass, nodularity and endobronchial debris, new from prior and most indicative of an infectious bronchiolitis. Recommend repeat low-dose lung cancer screening CT in 4-6 weeks, after appropriate therapy.  Pt. States she has been symptomatic.I have asked her to call her PCP today to be seen and treated if appropriate , and we will repeat Low Dose Screening CT Chest in 6 weeks. I told her we will call closer to the time to get this scheduled  She verbalized understanding of the above, and had no further questions.   Tricia [name], I have faxed the results to her PCP. Please plaxce follow up Low Dose CT Chest order for 6 weeks. Thanks so much

## 2021-09-01 ENCOUNTER — Telehealth: Payer: Self-pay | Admitting: *Deleted

## 2021-09-01 ENCOUNTER — Encounter: Payer: Self-pay | Admitting: Internal Medicine

## 2021-09-01 ENCOUNTER — Other Ambulatory Visit
Admission: RE | Admit: 2021-09-01 | Discharge: 2021-09-01 | Disposition: A | Discharge status: Home / Self Care | Payer: Medicare Other | Attending: Internal Medicine | Admitting: Internal Medicine

## 2021-09-01 ENCOUNTER — Ambulatory Visit (INDEPENDENT_AMBULATORY_CARE_PROVIDER_SITE_OTHER): Payer: Medicare Other | Admitting: Internal Medicine

## 2021-09-01 VITALS — BP 110/62 | HR 122 | Resp 20 | Ht 68.0 in | Wt 143.3 lb

## 2021-09-01 DIAGNOSIS — M7989 Other specified soft tissue disorders: Secondary | ICD-10-CM | POA: Diagnosis not present

## 2021-09-01 DIAGNOSIS — R7981 Abnormal blood-gas level: Secondary | ICD-10-CM

## 2021-09-01 DIAGNOSIS — E8779 Other fluid overload: Secondary | ICD-10-CM

## 2021-09-01 DIAGNOSIS — J219 Acute bronchiolitis, unspecified: Secondary | ICD-10-CM

## 2021-09-01 LAB — CBC WITH DIFFERENTIAL/PLATELET
Abs Immature Granulocytes: 0.06 10*3/uL (ref 0.00–0.07)
Basophils Absolute: 0.1 10*3/uL (ref 0.0–0.1)
Basophils Relative: 1 %
Eosinophils Absolute: 0 10*3/uL (ref 0.0–0.5)
Eosinophils Relative: 0 %
HCT: 38.6 % (ref 36.0–46.0)
Hemoglobin: 12.9 g/dL (ref 12.0–15.0)
Immature Granulocytes: 1 %
Lymphocytes Relative: 16 %
Lymphs Abs: 2.1 10*3/uL (ref 0.7–4.0)
MCH: 30.6 pg (ref 26.0–34.0)
MCHC: 33.4 g/dL (ref 30.0–36.0)
MCV: 91.5 fL (ref 80.0–100.0)
Monocytes Absolute: 0.6 10*3/uL (ref 0.1–1.0)
Monocytes Relative: 5 %
Neutro Abs: 10.1 10*3/uL — ABNORMAL HIGH (ref 1.7–7.7)
Neutrophils Relative %: 77 %
Platelets: 308 10*3/uL (ref 150–400)
RBC: 4.22 MIL/uL (ref 3.87–5.11)
RDW: 13.7 % (ref 11.5–15.5)
WBC: 13 10*3/uL — ABNORMAL HIGH (ref 4.0–10.5)
nRBC: 0 % (ref 0.0–0.2)

## 2021-09-01 LAB — BASIC METABOLIC PANEL
Anion gap: 8 (ref 5–15)
BUN: 11 mg/dL (ref 6–20)
CO2: 28 mmol/L (ref 22–32)
Calcium: 8.1 mg/dL — ABNORMAL LOW (ref 8.9–10.3)
Chloride: 99 mmol/L (ref 98–111)
Creatinine, Ser: 0.93 mg/dL (ref 0.44–1.00)
GFR, Estimated: >60 mL/min (ref >60)
Glucose, Bld: 136 mg/dL — ABNORMAL HIGH (ref 70–99)
Potassium: 3.4 mmol/L — ABNORMAL LOW (ref 3.5–5.1)
Sodium: 135 mmol/L (ref 135–145)

## 2021-09-01 LAB — BRAIN NATRIURETIC PEPTIDE: B Natriuretic Peptide: 50.5 pg/mL (ref 0.0–100.0)

## 2021-09-01 MED ORDER — PREDNISONE 20 MG PO TABS: 20.0000 mg | ORAL_TABLET | Freq: Two times a day (BID) | ORAL | 0 refills | Status: AC

## 2021-09-01 MED ORDER — LEVOFLOXACIN 500 MG PO TABS: 500.0000 mg | ORAL_TABLET | Freq: Every day | ORAL | 0 refills | Status: AC

## 2021-09-01 NOTE — Progress Notes (Signed)
Acute Office Visit  Subjective:     Patient ID: Tricia [name], female    DOB: [birth year], 61 y.o.   MRN: [id number]  Chief Complaint  Patient presents with   Cough    Abnormal ct scan    HPI Patient is in today for cough x 2 months.   Cough:  -Fever: no -Cough: yes productive, green  -Shortness of breath: yes -Wheezing: yes -Chest pain: no -Chest tightness: yes -Chest congestion: yes -Nasal congestion: yes -Runny nose: yes, clear  -Post nasal drip: no -Recently had regularly scheduled low-dose CT scan for lung cancer screening on 08/25/2021 which cannot be completed due to extensive mid and lower lung zone predominant peribronchovascular groundglass, nodularity and endobronchial debris new from prior and most indicative of infectious bronchiolitis with recommendations to repeat low-dose CT for lung cancer screening in 4 to 6 weeks after treatment. Saw Pulmonologist 06/19/21 for COPD - Was treated with Doxycycline 100 mg BID for 5 days which did not help her symptoms and was also given Prednisone, which did help. Hasn't been on anything since May, symptoms worsening.   Review of Systems  Constitutional:  Positive for malaise/fatigue. Negative for chills and fever.  HENT:  Negative for congestion, ear discharge, ear pain, sinus pain and sore throat.   Respiratory:  Positive for cough, sputum production, shortness of breath and wheezing.   Cardiovascular:  Positive for leg swelling. Negative for chest pain.        Objective:    BP 110/62   Pulse (!) 122   Resp 20   Ht '5\' 8"'$  (1.727 m)   Wt 143 lb 4.8 oz (65 kg)   SpO2 92%   BMI 21.79 kg/m  BP Readings from Last 3 Encounters:  09/01/21 110/62  08/21/21 104/68  07/19/21 106/89   Wt Readings from Last 3 Encounters:  09/01/21 143 lb 4.8 oz (65 kg)  08/21/21 140 lb (63.5 kg)  07/19/21 143 lb (64.9 kg)      Physical Exam Constitutional:      Appearance: Normal appearance. She is ill-appearing.  HENT:      Head: Normocephalic and atraumatic.     Mouth/Throat:     Mouth: Mucous membranes are moist.     Pharynx: Oropharynx is clear.  Eyes:     Conjunctiva/sclera: Conjunctivae normal.  Cardiovascular:     Rate and Rhythm: Regular rhythm. Tachycardia present.  Pulmonary:     Effort: Pulmonary effort is normal.     Breath sounds: Wheezing and rhonchi present.  Musculoskeletal:     Right lower leg: Edema present.     Left lower leg: Edema present.     Comments: 2+ BLE pitting edema with chronic venous stasis dermatitis   Skin:    General: Skin is warm and dry.  Neurological:     General: No focal deficit present.     Mental Status: She is alert. Mental status is at baseline.  Psychiatric:        Mood and Affect: Mood normal.        Behavior: Behavior normal.     No results found for any visits on 09/01/21.      Assessment & Plan:   1. Bronchiolitis/Leg swelling: Wheezes/rhonchi throughout on lung exam, appears ill. Oxygen saturation 90% with walking, 92% at rest. Does not want to go to the hospital. Stat labs to evaluate kidneys, WBC and BNP due to pitting edema in legs, which she says is new. Has history of chronic diastolic  HF. Echo from 09/2014 with EF 60-65%. Bronchiolitis per CT but fluid overload contributing to shortness of breath/respiratory symptoms as well. Penicillin allergic, will treat with Levaquin 500 mg daily for 7 days, Prednisone 40 mg x 5 days and will double Lasix to 80 mg for 2 days and resume regular 40 mg dosing. Kidney function 1.06 on 08/21/21. Monitor pulse ox at home, if < 88% and not coming up will present to ER for oxygen. If fevers, worsening of symptoms over the weekend will present to ER. Follow up here in 1 week for recheck.   - levofloxacin (LEVAQUIN) 500 MG tablet; Take 1 tablet (500 mg total) by mouth daily for 7 days.  Dispense: 7 tablet; Refill: 0 - predniSONE (DELTASONE) 20 MG tablet; Take 1 tablet (20 mg total) by mouth 2 (two) times daily with a meal  for 5 days.  Dispense: 10 tablet; Refill: 0 - CBC w/Diff/Platelet; Future - Basic Metabolic Panel (BMET); Future - B Nat Peptide; Future  Return in about 1 week (around 09/08/2021).  Teodora Medici, DO

## 2021-09-01 NOTE — Patient Instructions (Addendum)
It was great seeing you today!  Plan discussed at today's visit: -Blood work ordered today, results will be uploaded to [facility].  -Take antibiotic on an empty stomach once a day for 7 days -Take steroids twice daily for 5 days -Increase Lasix to twice a day for 2 days  Follow up in: 1 week   Take care and let us know if you have any questions or concerns prior to your next visit.  Dr. Rosana Berger

## 2021-09-01 NOTE — Chronic Care Management (AMB) (Signed)
  Chronic Care Management   Note  09/01/2021 Name: Tricia [name] MRN: [id number] DOB: [birth year]  Tricia [name] is a 61 y.o. year old female who is a primary care patient of [name], MD. Tricia [name] is currently enrolled in care management services. An additional referral for [facility] and Licensed Clinical SW was placed.   Follow up plan: Unsuccessful telephone outreach attempt made. A HIPAA compliant phone message was left for the patient providing contact information and requesting a return call.   Julian Hy, [place] Direct Dial: [phone]

## 2021-09-05 ENCOUNTER — Telehealth: Payer: Self-pay

## 2021-09-05 NOTE — Telephone Encounter (Signed)
   Telephone encounter was:  Successful.  09/05/2021 Name: [name] MRN: [id number] DOB: 11-03-1960  Tricia [name] is a 61 y.o. year old female who is a primary care patient of [name], MD . The community resource team was consulted for assistance with Transportation Needs , Food Insecurity, and utilities.  Care guide performed the following interventions: Spoke with patient, verified email [email or web address], sent food pantry list, transportation and utility assistance resources.  Follow Up Plan:  No further follow up planned at this time. The patient has been provided with needed resources.  Shundra Wirsing, AAS Paralegal, [facility]  [address], [place] [address] ??[email or web address]  ?? [phone]   [email or web address]

## 2021-09-07 NOTE — Progress Notes (Signed)
Name: Tricia [name]   MRN: [id number]    DOB: 1960/06/10   Date:09/08/2021       Progress Note  Subjective  Chief Complaint  Follow Up  HPI  Patient came in today for a follow up, however she is likely septic today and we will send her to [facility]  She went to [facility] on 09/03/2021 because of SOB, she had a CT chest and labs done, she was diagnosed with pneumonia and given IV antibiotics and advised to be admitted but she declined, she went home on Zpack and prednisone for 4 days , today she is even more hypoxic, pulse ox down from 93 % at [facility] to 84 % today, heart rate is up from 96 to 113 and temperature is now 101.4 , the day of her visit she was afebrile. She continues to have nausea, increase SOB and change in appetite. She drove her by herself. Explained she must be admitted and is likely septic. She agrees on going to [facility] but she refused going by EMS due to cost . She allowed me to call her sister to notify our plan   07/10 she was found to have abnormal CT lung cancer screen with possible infection she was given Levaquin   Patient Active Problem List   Diagnosis Date Noted   Lumbar herniated disc 07/11/2021   Calculus of gallbladder without cholecystitis without obstruction 07/11/2021   Senile purpura ([place]) 06/05/2021   Atherosclerosis of aorta ([place]) 06/05/2021   Parkinson's disease ([place]) 06/05/2021   Chronic kidney disease (CKD) stage G3a/A2, moderately decreased glomerular filtration rate (GFR) between 45-59 mL/min/1.73 square meter and albuminuria creatinine ratio between 30-299 mg/g ([place]) 06/05/2021   Esophageal dysphagia    Gastric erythema    Columnar-lined esophagus    Moderate malnutrition ([place]) 09/26/2020   Centrilobular emphysema ([place]) 03/30/2020   History of prolonged Q-T interval on ECG 11/18/2019   GAD (generalized anxiety disorder) 10/13/2019   MDD (major depressive disorder), recurrent episode, moderate ([place]) 10/13/2019   At risk for long QT  syndrome 10/13/2019   Nonrheumatic mitral valve regurgitation 05/04/2019   Benign neoplasm of descending colon    Polyp of sigmoid colon    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   Polyneuropathy 10/21/2014   Chronic venous insufficiency 10/05/2014   Bilateral leg edema 08/16/2014   Major depression in partial remission ([place]) 08/16/2014   Acid reflux 08/16/2014   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   Asthma, moderate persistent 08/16/2014   Carpal tunnel syndrome 08/16/2014   Cervical pain 08/16/2014   CAFL (chronic airflow limitation) ([place]) 08/16/2014   Type 2 diabetes mellitus with peripheral neuropathy ([place]) 08/16/2014   Diabetes mellitus type 2, insulin dependent ([place]) 08/16/2014   Dyslipidemia 08/16/2014   Tobacco use disorder 08/16/2014   Essential (primary) hypertension 08/16/2014   Benign neoplasm of stomach 08/16/2014   Gout 08/16/2014   HLD (hyperlipidemia) 08/16/2014   Low back pain 08/16/2014   Lumbar radiculopathy 08/16/2014   Headache, migraine 08/16/2014   Arthralgia of multiple joints 08/16/2014   Avitaminosis D 08/16/2014   Primary osteoarthritis of both knees 06/22/2014   Benign essential tremor 10/02/2013   Cervical dystonia 10/02/2013   Chronic diastolic heart failure ([place]) 11/16/2012   Chronic pain 11/13/2012    Past Surgical History:  Procedure Laterality Date   CARPAL TUNNEL RELEASE Bilateral    x2 right, 1x on left   COLONOSCOPY     COLONOSCOPY WITH PROPOFOL N/A 04/25/2017   Procedure: COLONOSCOPY WITH  PROPOFOL;  Surgeon: [name], MD;  Location: [place];  Service: Endoscopy;  Laterality: N/A;  diabetic-oral med   DILATION AND CURETTAGE OF UTERUS     ESOPHAGOGASTRODUODENOSCOPY (EGD) WITH PROPOFOL N/A 01/10/2021   Procedure: ESOPHAGOGASTRODUODENOSCOPY (EGD) WITH PROPOFOL;  Surgeon: Virgel Manifold, MD;  Location: [facility];  Service: Endoscopy;  Laterality: N/A;  Diabetic   EXTERNAL EAR SURGERY Left    x2    POLYPECTOMY  04/25/2017   Procedure: POLYPECTOMY INTESTINAL;  Surgeon: [name], MD;  Location: [facility];  Service: Endoscopy;;   SPINE SURGERY     herniated disc   TUBAL LIGATION      Family History  Problem Relation Age of Onset   Emphysema Mother    Anxiety disorder Mother    Stroke Father    Throat cancer Father    Multiple sclerosis Daughter    Bipolar disorder Daughter    Cervical cancer Daughter    Bipolar disorder Daughter    Drug abuse Daughter    Lung cancer Maternal Aunt    Lung cancer Maternal Uncle    Lung cancer Maternal Grandmother    Lung cancer Maternal Grandfather     Social History   Tobacco Use   Smoking status: Every Day    Packs/day: 1.00    Years: 41.00    Total pack years: 41.00    Types: Cigarettes    Start date: 05/19/1977   Smokeless tobacco: Never  Substance Use Topics   Alcohol use: No    Alcohol/week: 0.0 standard drinks of alcohol     Current Outpatient Medications:    blood glucose meter kit and supplies KIT, Dispense accuchek aviva plus; can change if needed e11.9, Disp: 1 each, Rfl: 0   budesonide-formoterol (SYMBICORT) 160-4.5 MCG/ACT inhaler, Inhale 2 puffs into the lungs in the morning and at bedtime., Disp: , Rfl:    busPIRone (BUSPAR) 7.5 MG tablet, Take 7.5 mg by mouth 2 (two) times daily., Disp: , Rfl:    cetirizine (ZYRTEC) 10 MG tablet, Take 10 mg by mouth daily as needed for allergies., Disp: , Rfl:    colchicine 0.6 MG tablet, TAKE 1 TABLET BY MOUTH ONCE DAILY, Disp: 30 tablet, Rfl: 0   cyclobenzaprine (FLEXERIL) 10 MG tablet, Take 10 mg by mouth at bedtime., Disp: , Rfl:    DULoxetine (CYMBALTA) 30 MG capsule, TAKE 1 CAPSULE BY MOUTH ONCE DAILY., Disp: 90 capsule, Rfl: 1   Empagliflozin-metFORMIN HCl (SYNJARDY) 12.5-500 MG TABS, Take 1 tablet by mouth 2 (two) times daily., Disp: 180 tablet, Rfl: 1   furosemide (LASIX) 40 MG tablet, Take 1 tablet (40 mg total) by mouth daily as needed., Disp: 30 tablet, Rfl: 2    gabapentin (NEURONTIN) 600 MG tablet, Take 1,200 mg by mouth 3 (three) times daily., Disp: , Rfl:    glucose blood test strip, Use as directed to check blood glucose daily, Disp: 100 each, Rfl: 2   hydrOXYzine (ATARAX) 25 MG tablet, Take 25 mg by mouth 2 (two) times daily., Disp: , Rfl:    Insulin Pen Needle 32G X 6 MM MISC, 1 each by Does not apply route daily., Disp: 100 each, Rfl: 2   ipratropium-albuterol (DUONEB) 0.5-2.5 (3) MG/3ML SOLN, Inhale 3 mLs into the lungs every 6 (six) hours as needed., Disp: 360 mL, Rfl: 3   levofloxacin (LEVAQUIN) 500 MG tablet, Take 1 tablet (500 mg total) by mouth daily for 7 days., Disp: 7 tablet, Rfl: 0   lidocaine (  LIDODERM) 5 %, 1 patch every 12 (twelve) hours as needed., Disp: , Rfl:    lubiprostone (AMITIZA) 24 MCG capsule, Take by mouth 2 (two) times daily as needed., Disp: , Rfl:    montelukast (SINGULAIR) 10 MG tablet, Take 1 tablet by mouth at bedtime., Disp: , Rfl:    morphine (MS CONTIN) 30 MG 12 hr tablet, Take 30 mg by mouth every 12 (twelve) hours., Disp: , Rfl:    omeprazole (PRILOSEC) 40 MG capsule, Take 1 capsule (40 mg total) by mouth daily., Disp: 90 capsule, Rfl: 0   Oxycodone HCl 10 MG TABS, Take 10 mg by mouth 4 (four) times daily as needed. , Disp: , Rfl:    promethazine (PHENERGAN) 25 MG tablet, Take 25 mg by mouth daily as needed., Disp: , Rfl:    rizatriptan (MAXALT) 10 MG tablet, Take 10 mg by mouth as needed. May repeat in 2 hours if needed, Disp: , Rfl:    rosuvastatin (CRESTOR) 5 MG tablet, Take 1 tablet (5 mg total) by mouth at bedtime., Disp: 90 tablet, Rfl: 1   theophylline (UNIPHYL) 400 MG 24 hr tablet, Take 1 tablet by mouth daily. , Disp: , Rfl:    tiotropium (SPIRIVA) 18 MCG inhalation capsule, Place 1 capsule into inhaler and inhale daily. pm, Disp: , Rfl:    traZODone (DESYREL) 100 MG tablet, Take 1 tablet (100 mg total) by mouth at bedtime., Disp: 90 tablet, Rfl: 1   VENTOLIN HFA 108 (90 Base) MCG/ACT inhaler, INHALE 1  PUFF BY MOUTH AS NEEDED, Disp: 18 g, Rfl: 0   carbidopa-levodopa (SINEMET IR) 25-100 MG tablet, Take 1.5 tablets by mouth 3 (three) times daily., Disp: , Rfl:   Allergies  Allergen Reactions   Augmentin [Amoxicillin-Pot Clavulanate] Diarrhea   Penicillins Itching    I personally reviewed active problem list, medication list, allergies, family history, social history, health maintenance with the patient/caregiver today.   ROS  Ten systems reviewed and is negative except as mentioned in HPI   Objective  Vitals:   09/08/21 1427  BP: 90/70  Pulse: (!) 113  Resp: 20  Temp: (!) 101.4 F (38.6 C)  TempSrc: Oral  SpO2: (!) 84%  Weight: 137 lb 12.8 oz (62.5 kg)  Height: 5' 8.5" (1.74 m)    Body mass index is 20.65 kg/m.  Physical Exam  Constitutional: Patient appears frail, hand tremors, labored breathing   HEENT: head atraumatic, normocephalic, neck supple, Cardiovascular: Normal rate, regular rhythm and normal heart sounds.  No murmur heard. 2 plus pitting edema Pulmonary/Chest: labored breathing, rhonchi on left lung field  Abdominal: Soft.  There is no tenderness. Psychiatric: Patient has a normal mood and affect. behavior is normal. Judgment and thought content normal.     PHQ2/9:    09/08/2021    2:42 PM 08/21/2021    1:42 PM 07/11/2021    3:42 PM 07/06/2021   10:20 AM 06/05/2021    1:57 PM  Depression screen PHQ 2/9  Decreased Interest 3 3 3 1 3   Down, Depressed, Hopeless 3 3 3 3 3   PHQ - 2 Score 6 6 6 4 6   Altered sleeping 0 0 3 1 3   Tired, decreased energy 3 3 3 2 3   Change in appetite 0 0 0 3 2  Feeling bad or failure about yourself  0 0 0 0 0  Trouble concentrating 1 1 3 2 3   Moving slowly or fidgety/restless 0 0 0 0 0  Suicidal thoughts 0 0  0 0 0  PHQ-9 Score 10 10 15 12 17   Difficult doing work/chores Very difficult   Somewhat difficult     phq 9 is positive   Fall Risk:    09/08/2021    2:41 PM 08/21/2021    1:37 PM 07/11/2021    3:42 PM 07/06/2021    10:20 AM 06/05/2021    1:57 PM  Fall Risk   Falls in the past year? 1 1 1 1 1   Number falls in past yr: 1 1 1 1  0  Injury with Fall? 1 1 1 1 1   Risk for fall due to : History of fall(s) No Fall Risks No Fall Risks History of fall(s) Impaired balance/gait  Follow up Falls evaluation completed;Falls prevention discussed;Education provided Falls prevention discussed Falls prevention discussed Education provided;Falls evaluation completed;Falls prevention discussed Falls prevention discussed      Functional Status Survey: Is the patient deaf or have difficulty hearing?: Yes Does the patient have difficulty seeing, even when wearing glasses/contacts?: Yes Does the patient have difficulty concentrating, remembering, or making decisions?: No Does the patient have difficulty walking or climbing stairs?: Yes Does the patient have difficulty dressing or bathing?: Yes (trouble dressing herself) Does the patient have difficulty doing errands alone such as visiting a doctor's office or shopping?: No    Assessment & Plan  1. Hypoxemia  She is likely spetic, spoke to her sister at [phone] ( with patient's permission) explained she is refusing to go by EMS.  She will drive to [facility], we called ahead to give report   Reviewed records from [place] report to [place] at [facility] and explained that even though she is going by private car she needs to be evaluated asap since she is likely septic   2. Other fatigue   3. Abnormal CT of the chest  On recent CT lung screen   4. Type 2 diabetes mellitus with peripheral neuropathy (HCC)  - POCT HgB A1C - Urine Microalbumin w/creat. ratio  5. Fever in other diseases   6. Tachycardia

## 2021-09-08 ENCOUNTER — Encounter: Payer: Self-pay | Admitting: Family Medicine

## 2021-09-08 ENCOUNTER — Emergency Department: Payer: Medicare Other

## 2021-09-08 ENCOUNTER — Ambulatory Visit (INDEPENDENT_AMBULATORY_CARE_PROVIDER_SITE_OTHER): Payer: Medicare Other | Admitting: Family Medicine

## 2021-09-08 ENCOUNTER — Inpatient Hospital Stay
Admission: EM | Admit: 2021-09-08 | Discharge: 2021-09-10 | DRG: 871 | Disposition: A | Discharge status: Home / Self Care | Payer: Medicare Other | Attending: Obstetrics and Gynecology | Admitting: Obstetrics and Gynecology

## 2021-09-08 ENCOUNTER — Other Ambulatory Visit: Payer: Self-pay

## 2021-09-08 VITALS — BP 90/70 | HR 113 | Temp 101.4°F | Resp 20 | Ht 68.5 in | Wt 137.8 lb

## 2021-09-08 DIAGNOSIS — E876 Hypokalemia: Secondary | ICD-10-CM | POA: Diagnosis present

## 2021-09-08 DIAGNOSIS — Z881 Allergy status to other antibiotic agents status: Secondary | ICD-10-CM | POA: Diagnosis not present

## 2021-09-08 DIAGNOSIS — E1142 Type 2 diabetes mellitus with diabetic polyneuropathy: Secondary | ICD-10-CM

## 2021-09-08 DIAGNOSIS — R6520 Severe sepsis without septic shock: Secondary | ICD-10-CM | POA: Diagnosis present

## 2021-09-08 DIAGNOSIS — E872 Acidosis, unspecified: Secondary | ICD-10-CM | POA: Diagnosis present

## 2021-09-08 DIAGNOSIS — E871 Hypo-osmolality and hyponatremia: Secondary | ICD-10-CM | POA: Diagnosis present

## 2021-09-08 DIAGNOSIS — I110 Hypertensive heart disease with heart failure: Secondary | ICD-10-CM | POA: Diagnosis present

## 2021-09-08 DIAGNOSIS — R110 Nausea: Secondary | ICD-10-CM | POA: Diagnosis not present

## 2021-09-08 DIAGNOSIS — E8809 Other disorders of plasma-protein metabolism, not elsewhere classified: Secondary | ICD-10-CM

## 2021-09-08 DIAGNOSIS — Z20822 Contact with and (suspected) exposure to covid-19: Secondary | ICD-10-CM | POA: Diagnosis present

## 2021-09-08 DIAGNOSIS — R0902 Hypoxemia: Secondary | ICD-10-CM

## 2021-09-08 DIAGNOSIS — G20 Parkinson's disease: Secondary | ICD-10-CM | POA: Diagnosis present

## 2021-09-08 DIAGNOSIS — F32A Depression, unspecified: Secondary | ICD-10-CM

## 2021-09-08 DIAGNOSIS — E119 Type 2 diabetes mellitus without complications: Secondary | ICD-10-CM

## 2021-09-08 DIAGNOSIS — M1990 Unspecified osteoarthritis, unspecified site: Secondary | ICD-10-CM | POA: Diagnosis present

## 2021-09-08 DIAGNOSIS — H9192 Unspecified hearing loss, left ear: Secondary | ICD-10-CM | POA: Diagnosis present

## 2021-09-08 DIAGNOSIS — Z7951 Long term (current) use of inhaled steroids: Secondary | ICD-10-CM

## 2021-09-08 DIAGNOSIS — F17210 Nicotine dependence, cigarettes, uncomplicated: Secondary | ICD-10-CM | POA: Diagnosis present

## 2021-09-08 DIAGNOSIS — E782 Mixed hyperlipidemia: Secondary | ICD-10-CM | POA: Diagnosis present

## 2021-09-08 DIAGNOSIS — E46 Unspecified protein-calorie malnutrition: Secondary | ICD-10-CM | POA: Diagnosis not present

## 2021-09-08 DIAGNOSIS — J440 Chronic obstructive pulmonary disease with acute lower respiratory infection: Secondary | ICD-10-CM | POA: Diagnosis present

## 2021-09-08 DIAGNOSIS — N390 Urinary tract infection, site not specified: Secondary | ICD-10-CM | POA: Diagnosis present

## 2021-09-08 DIAGNOSIS — R5081 Fever presenting with conditions classified elsewhere: Secondary | ICD-10-CM

## 2021-09-08 DIAGNOSIS — Z880 Allergy status to penicillin: Secondary | ICD-10-CM

## 2021-09-08 DIAGNOSIS — J189 Pneumonia, unspecified organism: Secondary | ICD-10-CM | POA: Diagnosis not present

## 2021-09-08 DIAGNOSIS — R5383 Other fatigue: Secondary | ICD-10-CM | POA: Diagnosis not present

## 2021-09-08 DIAGNOSIS — K219 Gastro-esophageal reflux disease without esophagitis: Secondary | ICD-10-CM | POA: Diagnosis present

## 2021-09-08 DIAGNOSIS — Z79899 Other long term (current) drug therapy: Secondary | ICD-10-CM

## 2021-09-08 DIAGNOSIS — Z79890 Hormone replacement therapy (postmenopausal): Secondary | ICD-10-CM

## 2021-09-08 DIAGNOSIS — I10 Essential (primary) hypertension: Secondary | ICD-10-CM | POA: Diagnosis present

## 2021-09-08 DIAGNOSIS — G20A1 Parkinson's disease without dyskinesia, without mention of fluctuations: Secondary | ICD-10-CM | POA: Diagnosis present

## 2021-09-08 DIAGNOSIS — J9601 Acute respiratory failure with hypoxia: Secondary | ICD-10-CM

## 2021-09-08 DIAGNOSIS — Z79891 Long term (current) use of opiate analgesic: Secondary | ICD-10-CM

## 2021-09-08 DIAGNOSIS — R9389 Abnormal findings on diagnostic imaging of other specified body structures: Secondary | ICD-10-CM

## 2021-09-08 DIAGNOSIS — A419 Sepsis, unspecified organism: Principal | ICD-10-CM

## 2021-09-08 DIAGNOSIS — Z818 Family history of other mental and behavioral disorders: Secondary | ICD-10-CM

## 2021-09-08 DIAGNOSIS — I5032 Chronic diastolic (congestive) heart failure: Secondary | ICD-10-CM | POA: Diagnosis present

## 2021-09-08 DIAGNOSIS — R000 Tachycardia, unspecified: Secondary | ICD-10-CM

## 2021-09-08 DIAGNOSIS — F411 Generalized anxiety disorder: Secondary | ICD-10-CM | POA: Diagnosis present

## 2021-09-08 DIAGNOSIS — D72829 Elevated white blood cell count, unspecified: Secondary | ICD-10-CM | POA: Diagnosis not present

## 2021-09-08 DIAGNOSIS — J441 Chronic obstructive pulmonary disease with (acute) exacerbation: Secondary | ICD-10-CM | POA: Diagnosis present

## 2021-09-08 DIAGNOSIS — J154 Pneumonia due to other streptococci: Secondary | ICD-10-CM | POA: Diagnosis present

## 2021-09-08 LAB — CBC WITH DIFFERENTIAL/PLATELET
Abs Immature Granulocytes: 0.17 10*3/uL — ABNORMAL HIGH (ref 0.00–0.07)
Basophils Absolute: 0.1 10*3/uL (ref 0.0–0.1)
Basophils Relative: 1 %
Eosinophils Absolute: 0 10*3/uL (ref 0.0–0.5)
Eosinophils Relative: 0 %
HCT: 39.2 % (ref 36.0–46.0)
Hemoglobin: 12.9 g/dL (ref 12.0–15.0)
Immature Granulocytes: 1 %
Lymphocytes Relative: 14 %
Lymphs Abs: 2.6 10*3/uL (ref 0.7–4.0)
MCH: 30.8 pg (ref 26.0–34.0)
MCHC: 32.9 g/dL (ref 30.0–36.0)
MCV: 93.6 fL (ref 80.0–100.0)
Monocytes Absolute: 0.6 10*3/uL (ref 0.1–1.0)
Monocytes Relative: 3 %
Neutro Abs: 15.5 10*3/uL — ABNORMAL HIGH (ref 1.7–7.7)
Neutrophils Relative %: 81 %
Platelets: 347 10*3/uL (ref 150–400)
RBC: 4.19 MIL/uL (ref 3.87–5.11)
RDW: 14 % (ref 11.5–15.5)
WBC: 19 10*3/uL — ABNORMAL HIGH (ref 4.0–10.5)
nRBC: 0 % (ref 0.0–0.2)

## 2021-09-08 LAB — URINALYSIS, ROUTINE W REFLEX MICROSCOPIC
Bilirubin Urine: NEGATIVE
Glucose, UA: 50 mg/dL — AB
Ketones, ur: NEGATIVE mg/dL
Nitrite: NEGATIVE
Protein, ur: NEGATIVE mg/dL
Specific Gravity, Urine: 1.006 (ref 1.005–1.030)
WBC, UA: >50 WBC/hpf — ABNORMAL HIGH (ref 0–5)
pH: 6 (ref 5.0–8.0)

## 2021-09-08 LAB — COMPREHENSIVE METABOLIC PANEL
ALT: 36 U/L (ref 0–44)
AST: 59 U/L — ABNORMAL HIGH (ref 15–41)
Albumin: 2.7 g/dL — ABNORMAL LOW (ref 3.5–5.0)
Alkaline Phosphatase: 112 U/L (ref 38–126)
Anion gap: 6 (ref 5–15)
BUN: 10 mg/dL (ref 6–20)
CO2: 37 mmol/L — ABNORMAL HIGH (ref 22–32)
Calcium: 7.7 mg/dL — ABNORMAL LOW (ref 8.9–10.3)
Chloride: 89 mmol/L — ABNORMAL LOW (ref 98–111)
Creatinine, Ser: 0.91 mg/dL (ref 0.44–1.00)
GFR, Estimated: >60 mL/min (ref >60)
Glucose, Bld: 113 mg/dL — ABNORMAL HIGH (ref 70–99)
Potassium: 2.8 mmol/L — ABNORMAL LOW (ref 3.5–5.1)
Sodium: 132 mmol/L — ABNORMAL LOW (ref 135–145)
Total Bilirubin: 0.8 mg/dL (ref 0.3–1.2)
Total Protein: 6.2 g/dL — ABNORMAL LOW (ref 6.5–8.1)

## 2021-09-08 LAB — BLOOD GAS, VENOUS
Acid-Base Excess: 11.9 mmol/L — ABNORMAL HIGH (ref 0.0–2.0)
Bicarbonate: 38.2 mmol/L — ABNORMAL HIGH (ref 20.0–28.0)
O2 Saturation: 51.7 %
Patient temperature: 37
pCO2, Ven: 55 mmHg (ref 44–60)
pH, Ven: 7.45 — ABNORMAL HIGH (ref 7.25–7.43)
pO2, Ven: 31 mmHg — CL (ref 32–45)

## 2021-09-08 LAB — LIPASE, BLOOD: Lipase: 19 U/L (ref 11–51)

## 2021-09-08 LAB — POCT GLYCOSYLATED HEMOGLOBIN (HGB A1C): Hemoglobin A1C: 6 % — AB (ref 4.0–5.6)

## 2021-09-08 LAB — PROCALCITONIN: Procalcitonin: 0.35 ng/mL

## 2021-09-08 LAB — LACTIC ACID, PLASMA: Lactic Acid, Venous: 2 mmol/L (ref 0.5–1.9)

## 2021-09-08 LAB — RESP PANEL BY RT-PCR (FLU A&B, COVID) ARPGX2
Influenza A by PCR: NEGATIVE
Influenza B by PCR: NEGATIVE
SARS Coronavirus 2 by RT PCR: NEGATIVE

## 2021-09-08 MED ORDER — IPRATROPIUM-ALBUTEROL 0.5-2.5 (3) MG/3ML IN SOLN
3.0000 mL | Freq: Once | RESPIRATORY_TRACT | Status: AC
  Administered 2021-09-08: 3 mL via RESPIRATORY_TRACT
  Filled 2021-09-08: qty 3

## 2021-09-08 MED ORDER — LACTATED RINGERS IV SOLN
INTRAVENOUS | Status: DC
Start: 2021-09-08 — End: 2021-09-09

## 2021-09-08 MED ORDER — SODIUM CHLORIDE 0.9 % IV SOLN: 2.0000 g | Freq: Once | INTRAVENOUS | Status: DC

## 2021-09-08 MED ORDER — AZITHROMYCIN 500 MG IV SOLR
500.0000 mg | Freq: Once | INTRAVENOUS | Status: AC
Start: 2021-09-08 — End: 2021-09-08
  Administered 2021-09-08: 500 mg via INTRAVENOUS
  Filled 2021-09-08: qty 5

## 2021-09-08 MED ORDER — GLUCERNA SHAKE PO LIQD
237.0000 mL | Freq: Three times a day (TID) | ORAL | Status: DC
  Administered 2021-09-09 – 2021-09-10 (×5): 237 mL via ORAL

## 2021-09-08 MED ORDER — POTASSIUM CHLORIDE CRYS ER 20 MEQ PO TBCR
40.0000 meq | EXTENDED_RELEASE_TABLET | Freq: Once | ORAL | Status: DC
  Filled 2021-09-08: qty 2

## 2021-09-08 MED ORDER — SODIUM CHLORIDE 0.9 % IV SOLN
2.0000 g | Freq: Three times a day (TID) | INTRAVENOUS | Status: DC
  Administered 2021-09-09 (×2): 2 g via INTRAVENOUS
  Filled 2021-09-08 (×2): qty 12.5

## 2021-09-08 MED ORDER — DM-GUAIFENESIN ER 30-600 MG PO TB12
1.0000 | ORAL_TABLET | Freq: Two times a day (BID) | ORAL | Status: DC
  Administered 2021-09-09 – 2021-09-10 (×3): 1 via ORAL
  Filled 2021-09-08 (×3): qty 1

## 2021-09-08 MED ORDER — INSULIN ASPART 100 UNIT/ML IJ SOLN
0.0000 [IU] | Freq: Three times a day (TID) | INTRAMUSCULAR | Status: DC
  Administered 2021-09-09: 5 [IU] via SUBCUTANEOUS
  Administered 2021-09-09: 2 [IU] via SUBCUTANEOUS
  Administered 2021-09-10 (×2): 3 [IU] via SUBCUTANEOUS
  Filled 2021-09-08 (×5): qty 1

## 2021-09-08 MED ORDER — POTASSIUM CHLORIDE 10 MEQ/100ML IV SOLN
10.0000 meq | INTRAVENOUS | Status: AC
  Administered 2021-09-08 (×2): 10 meq via INTRAVENOUS
  Filled 2021-09-08 (×2): qty 100

## 2021-09-08 MED ORDER — SODIUM CHLORIDE 0.9 % IV BOLUS
500.0000 mL | Freq: Once | INTRAVENOUS | Status: AC
  Administered 2021-09-08: 500 mL via INTRAVENOUS

## 2021-09-08 MED ORDER — SODIUM CHLORIDE 0.9 % IV SOLN
2.0000 g | Freq: Once | INTRAVENOUS | Status: AC
Start: 2021-09-08 — End: 2021-09-08
  Administered 2021-09-08: 2 g via INTRAVENOUS
  Filled 2021-09-08: qty 12.5

## 2021-09-08 MED ORDER — SODIUM CHLORIDE 0.9 % IV SOLN
500.0000 mg | INTRAVENOUS | Status: DC
  Administered 2021-09-09: 500 mg via INTRAVENOUS
  Filled 2021-09-08: qty 5

## 2021-09-08 NOTE — ED Provider Notes (Signed)
[facility] Provider Note    Event Date/Time   First MD Initiated Contact with Patient 09/08/21 1547     (approximate)   History   Fever and Flank Pain (/)   HPI  Tricia [name] is a 61 y.o. female with history of COPD with recent evaluation at [facility] diagnosed with pneumonia and was recommended to be admitted to the hospital but declined.  She followed up with her PCP today was found to be febrile hypoxic after ambulation to 84% and was directed the ER given concern for sepsis.  States that she been compliant with her medications feels like her cough is getting worse is having some intermittent flank pain.  No nausea or vomiting but has had decreased p.o. intake.  Denies any abdominal pain.  No dysuria.  No rash.     Physical Exam   Triage Vital Signs: ED Triage Vitals  Enc Vitals Group     BP --      Pulse --      Resp --      Temp 09/08/21 1555 98.9 F (37.2 C)     Temp Source 09/08/21 1555 Oral     SpO2 --      Weight --      Height --      Head Circumference --      Peak Flow --      Pain Score 09/08/21 1543 4     Pain Loc --      Pain Edu? --      Excl. in [place]? --     Most recent vital signs: Vitals:   09/08/21 1730 09/08/21 1800  BP: 106/60 (!) 127/54  Pulse: (!) 116 (!) 117  Resp: 18 (!) 21  Temp:    SpO2: 91% 93%     Constitutional: Ill-appearing slightly drowsy but protecting her airway and responding appropriately to questions Eyes: Conjunctivae are normal.  Head: Atraumatic. Nose: No congestion/rhinnorhea. Mouth/Throat: Mucous membranes are moist.   Neck: Painless ROM.  Cardiovascular:   Mildly tachycardic but well perfused Respiratory: Mildly tachypneic with diffuse expiratory wheeze. Gastrointestinal: Soft and nontender.  Musculoskeletal:  no deformity, trace bilateral lower extremity swelling and edema. Neurologic:  MAE spontaneously. No gross focal neurologic deficits are appreciated.  Skin:  Skin  is warm, dry and intact. No rash noted. Psychiatric: Mood and affect are normal. Speech and behavior are normal.    ED Results / Procedures / Treatments   Labs (all labs ordered are listed, but only abnormal results are displayed) Labs Reviewed  LACTIC ACID, PLASMA - Abnormal; Notable for the following components:      Result Value   Lactic Acid, Venous 2.0 (*)    All other components within normal limits  CBC WITH DIFFERENTIAL/PLATELET - Abnormal; Notable for the following components:   WBC 19.0 (*)    Neutro Abs 15.5 (*)    Abs Immature Granulocytes 0.17 (*)    All other components within normal limits  COMPREHENSIVE METABOLIC PANEL - Abnormal; Notable for the following components:   Sodium 132 (*)    Potassium 2.8 (*)    Chloride 89 (*)    CO2 37 (*)    Glucose, Bld 113 (*)    Calcium 7.7 (*)    Total Protein 6.2 (*)    Albumin 2.7 (*)    AST 59 (*)    All other components within normal limits  URINALYSIS, ROUTINE W REFLEX MICROSCOPIC - Abnormal; Notable for  the following components:   Color, Urine YELLOW (*)    APPearance CLOUDY (*)    Glucose, UA 50 (*)    Hgb urine dipstick MODERATE (*)    Leukocytes,Ua LARGE (*)    WBC, UA >50 (*)    Bacteria, UA RARE (*)    All other components within normal limits  BLOOD GAS, VENOUS - Abnormal; Notable for the following components:   pH, Ven 7.45 (*)    pO2, Ven 31 (*)    Bicarbonate 38.2 (*)    Acid-Base Excess 11.9 (*)    All other components within normal limits  RESP PANEL BY RT-PCR (FLU A&B, COVID) ARPGX2  CULTURE, BLOOD (SINGLE)  LIPASE, BLOOD  PROCALCITONIN  PROCALCITONIN     EKG  ED ECG REPORT ED ECG REPORT I, Merlyn Lot, the attending physician, personally viewed and interpreted this ECG.   Date: 09/08/2021  EKG Time: 16:31  Rate: 120  Rhythm: sinus  Axis: normal  Intervals: normal  ST&T Change: no stemi, no depressions     RADIOLOGY Please see ED Course for my review and  interpretation.  I personally reviewed all radiographic images ordered to evaluate for the above acute complaints and reviewed radiology reports and findings.  These findings were personally discussed with the patient.  Please see medical record for radiology report.    PROCEDURES:  Critical Care performed: Yes, see critical care procedure note(s)  .Critical Care  Performed by: Merlyn Lot, MD Authorized by: Merlyn Lot, MD   Critical care provider statement:    Critical care time (minutes):  35   Critical care was necessary to treat or prevent imminent or life-threatening deterioration of the following conditions:  Sepsis   Critical care was time spent personally by me on the following activities:  Ordering and performing treatments and interventions, ordering and review of laboratory studies, ordering and review of radiographic studies, pulse oximetry, re-evaluation of patient's condition, review of old charts, obtaining history from patient or surrogate, examination of patient, evaluation of patient's response to treatment, discussions with primary provider, discussions with consultants and development of treatment plan with patient or surrogate    MEDICATIONS ORDERED IN ED: Medications  lactated ringers infusion (has no administration in time range)  ceFEPIme (MAXIPIME) 2 g in sodium chloride 0.9 % 100 mL IVPB (has no administration in time range)  azithromycin (ZITHROMAX) 500 mg in sodium chloride 0.9 % 250 mL IVPB (has no administration in time range)  ipratropium-albuterol (DUONEB) 0.5-2.5 (3) MG/3ML nebulizer solution 3 mL (3 mLs Nebulization Given 09/08/21 1652)  ipratropium-albuterol (DUONEB) 0.5-2.5 (3) MG/3ML nebulizer solution 3 mL (3 mLs Nebulization Given 09/08/21 1652)  sodium chloride 0.9 % bolus 500 mL (500 mLs Intravenous New Bag/Given 09/08/21 1652)     IMPRESSION / MDM / [place] / ED COURSE  I reviewed the triage vital signs and the nursing  notes.                              Differential diagnosis includes, but is not limited to, sepsis, COPD, pneumonia, effusion, asthma, pyelonephritis  Patient presented to the ER for evaluation of symptoms as described above  This presenting complaint could reflect a potentially life-threatening illness therefore the patient will be placed on continuous pulse oximetry and telemetry for monitoring.  Laboratory evaluation will be sent to evaluate for the above complaints.     Clinical Course as of 09/08/21 1803  Fri Sep 08, 2021  1611 Chest x-ray on my review and interpretation does not show any evidence of pneumothorax. [PR]  0312 Patient found to be hypoxic on room air at 88% placed on 2 L nasal cannula.  VBG without hypercapnic respiratory failure.  COVID test negative flu negative.  Does have evidence of probable UTI leukocytosis hypokalemia we will give p.o. potassium as we are giving her nebulizer treatments.  Her abdominal exam is soft and benign.  Her lactate is elevated will give additional IV fluids.  She will require hospitalization as she is septic.  I have ordered IV cefepime as well as azithromycin to cover both possible pyelonephritis as well as pneumonia. [PR]    Clinical Course User Index [PR] Merlyn Lot, MD     FINAL CLINICAL IMPRESSION(S) / ED DIAGNOSES   Final diagnoses:  Sepsis with acute hypoxic respiratory failure without septic shock, due to unspecified organism [facility])     Rx / DC Orders   ED Discharge Orders     None        Note:  This document was prepared using Dragon voice recognition software and may include unintentional dictation errors.    Merlyn Lot, MD 09/08/21 [phone]

## 2021-09-08 NOTE — Consult Note (Signed)
CODE SEPSIS - PHARMACY COMMUNICATION  **Broad Spectrum Antibiotics should be administered within 1 hour of Sepsis diagnosis**  Time Code Sepsis Called/Page Received: 1802  Antibiotics Ordered: Cefepime & Azithromycin  Time of 1st antibiotic administration: 18196  Additional action taken by pharmacy: none  If necessary, Name of Provider/Nurse Contacted: n/a   Fayez Sturgell Rodriguez-Guzman PharmD, BCPS 09/08/2021 6:04 PM

## 2021-09-08 NOTE — ED Triage Notes (Signed)
Pt was sent from [facility] to be evaluated for possible sepsis, pt states she went for a follow up from having bronchitis, states that she doesn't feel any better, paperwork states O2 sats of 84%

## 2021-09-08 NOTE — Consult Note (Signed)
PHARMACY -  BRIEF ANTIBIOTIC NOTE   Pharmacy has received consult(s) for Cefepime from an ED provider.  [name] profile has been reviewed for ht/wt/allergies/indication/available labs.    One time order(s) placed for Cefepime 2gm  Further antibiotics/pharmacy consults should be ordered by admitting physician if indicated.                       Thank you, Jantz Main Rodriguez-Guzman PharmD, BCPS 09/08/2021 6:04 PM

## 2021-09-08 NOTE — Sepsis Progress Note (Signed)
eLink is following this Code Sepsis. °

## 2021-09-08 NOTE — ED Triage Notes (Signed)
Patient states she was sent by PCP with concerns for possible sepsis. Patient has been having abd pain with fever and bronchitis. O2 sats 90 in triage, no home O2

## 2021-09-08 NOTE — H&P (Signed)
History and Physical    [name] LKG:[id number] DOB: 05-25-1960 DOA: 09/08/2021 DOS: the patient was seen and examined on 09/08/2021 PCP: Tricia Sizer, MD  Patient coming from: Home  Chief Complaint:  Chief Complaint  Patient presents with   Fever   Flank Pain        HPI: Tricia [name] is a 61 y.o. female with medical history significant of COPD, prediabetes, hyperlipidemia, GERD, migraine headaches, anxiety, depression, benign essential tremor who presents to the emergency department due to shortness of breath and sepsis.  Patient presented to emergency department at [facility] on 09/03/2021 due to shortness of breath and she was diagnosed with pneumonia after CT of chest and labs were reviewed, she was treated with IV antibiotics and was going to be admitted to the hospital, but patient declined, so she was discharged with Z-Pak and prednisone for 4 days.  She continued to have shortness of breath and she presented to her PCPs office today where she was noted to be hypoxic with an O2 sat of 84% on room air, she was tachycardic with HR of 113 and febrile with temperature of 101.43F.  She complained of increased shortness of breath, nausea and change in appetite.  She was asked to go to the ED from PCPs office for further evaluation and management.  Patient also complained of 1 week onset of burning sensation on urination   ED Course:  In the emergency department, she was tachypneic, tachycardic and febrile with a temperature of 101.43F, she was hypoxic with an O2 sat of 84% on room air, supplemental oxygen was provided at 12 PM with improvement in O2 sat to 90s.  Work-up in the ED showed normal CBC except for leukocytosis, BMP showed hyponatremia, hypokalemia, albumin 2.7, AST 59, lipase 19, urinalysis was positive for large leukocytes and > 50 WBC.  Review of Systems: Review of systems as noted in the HPI. All other systems reviewed and are  negative.   Past Medical History:  Diagnosis Date   Anxiety    Arthritis    joints and hands/ knees   Asthma    uses inhaler   Benign essential tremor    head   Cervical dystonia    neck pain   Cholesteatoma of left ear    x2   COPD (chronic obstructive pulmonary disease) (HCC)    Cough    Depression    Diabetes mellitus without complication (HCC)    type 2   Diastolic dysfunction    Dyspnea    Dysrhythmia    diastolic dysfunction   GERD (gastroesophageal reflux disease)    Headache    migraines/ one per week   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   Hyperlipidemia    Hypertension    Motion sickness    boat   Neuromuscular disorder ([place])    neuropathy feet and hands( nerve damage)   Wears dentures    upper and lower   Past Surgical History:  Procedure Laterality Date   CARPAL TUNNEL RELEASE Bilateral    x2 right, 1x on left   COLONOSCOPY     COLONOSCOPY WITH PROPOFOL N/A 04/25/2017   Procedure: COLONOSCOPY WITH PROPOFOL;  Surgeon: [name], MD;  Location: [place];  Service: Endoscopy;  Laterality: N/A;  diabetic-oral med   DILATION AND CURETTAGE OF UTERUS     ESOPHAGOGASTRODUODENOSCOPY (EGD) WITH PROPOFOL N/A 01/10/2021   Procedure: ESOPHAGOGASTRODUODENOSCOPY (EGD) WITH PROPOFOL;  Surgeon: Bonna Gains,  Lennette Bihari, MD;  Location: [place];  Service: Endoscopy;  Laterality: N/A;  Diabetic   EXTERNAL EAR SURGERY Left    x2   POLYPECTOMY  04/25/2017   Procedure: POLYPECTOMY INTESTINAL;  Surgeon: [name], MD;  Location: [place];  Service: Endoscopy;;   SPINE SURGERY     herniated disc   TUBAL LIGATION      Social History:  reports that she has been smoking cigarettes. She started smoking about 44 years ago. She has a 41.00 pack-year smoking history. She has never used smokeless tobacco. She reports that she does not drink alcohol and does not use drugs.   Allergies  Allergen Reactions   Augmentin  [Amoxicillin-Pot Clavulanate] Diarrhea   Penicillins Itching    Family History  Problem Relation Age of Onset   Emphysema Mother    Anxiety disorder Mother    Stroke Father    Throat cancer Father    Multiple sclerosis Daughter    Bipolar disorder Daughter    Cervical cancer Daughter    Bipolar disorder Daughter    Drug abuse Daughter    Lung cancer Maternal Aunt    Lung cancer Maternal Uncle    Lung cancer Maternal Grandmother    Lung cancer Maternal Grandfather      Prior to Admission medications   Medication Sig Start Date End Date Taking? Authorizing Provider  blood glucose meter kit and supplies KIT Dispense accuchek aviva plus; can change if needed e11.9 01/18/21   Tricia Sizer, MD  budesonide-formoterol [facility]) 160-4.5 MCG/ACT inhaler Inhale 2 puffs into the lungs in the morning and at bedtime. 06/08/19   Erby Pian, MD  busPIRone (BUSPAR) 7.5 MG tablet Take 7.5 mg by mouth 2 (two) times daily. 06/14/21   [provider]  carbidopa-levodopa (SINEMET IR) 25-100 MG tablet Take 1.5 tablets by mouth 3 (three) times daily. 01/25/20 01/23/21  [name], MD  cetirizine (ZYRTEC) 10 MG tablet Take 10 mg by mouth daily as needed for allergies.    [provider]  colchicine 0.6 MG tablet TAKE 1 TABLET BY MOUTH ONCE DAILY 10/27/20   Tricia Sizer, MD  cyclobenzaprine (FLEXERIL) 10 MG tablet Take 10 mg by mouth at bedtime. 08/01/21   [provider]  DULoxetine (CYMBALTA) 30 MG capsule TAKE 1 CAPSULE BY MOUTH ONCE DAILY. 06/05/21   Tricia Sizer, MD  Empagliflozin-metFORMIN HCl (SYNJARDY) 12.5-500 MG TABS Take 1 tablet by mouth 2 (two) times daily. 01/31/21   Tricia Sizer, MD  furosemide (LASIX) 40 MG tablet Take 1 tablet (40 mg total) by mouth daily as needed. 06/21/17   [name], [name], NP  gabapentin (NEURONTIN) 600 MG tablet Take 1,200 mg by mouth 3 (three) times daily.    [provider]  glucose blood test strip Use as  directed to check blood glucose daily 05/13/20   Tricia Sizer, MD  hydrOXYzine (ATARAX) 25 MG tablet Take 25 mg by mouth 2 (two) times daily. 06/14/21   [provider]  Insulin Pen Needle 32G X 6 MM MISC 1 each by Does not apply route daily. 05/08/21   Tricia Sizer, MD  ipratropium-albuterol (DUONEB) 0.5-2.5 (3) MG/3ML SOLN Inhale 3 mLs into the lungs every 6 (six) hours as needed. 06/21/17   [name], [name], NP  levofloxacin (LEVAQUIN) 500 MG tablet Take 1 tablet (500 mg total) by mouth daily for 7 days. 09/01/21 09/08/21  Teodora Medici, DO  lidocaine (LIDODERM) 5 % 1 patch every 12 (twelve) hours  as needed. 02/02/20   [provider]  lubiprostone (AMITIZA) 24 MCG capsule Take by mouth 2 (two) times daily as needed. 02/05/20   [provider]  montelukast (SINGULAIR) 10 MG tablet Take 1 tablet by mouth at bedtime. 02/21/21   Erby Pian, MD  morphine (MS CONTIN) 30 MG 12 hr tablet Take 30 mg by mouth every 12 (twelve) hours. 09/20/15   [provider]  omeprazole (PRILOSEC) 40 MG capsule Take 1 capsule (40 mg total) by mouth daily. 11/03/20   Tricia Sizer, MD  Oxycodone HCl 10 MG TABS Take 10 mg by mouth 4 (four) times daily as needed.  10/12/19   [provider]  promethazine (PHENERGAN) 25 MG tablet Take 25 mg by mouth daily as needed. 04/28/18   [provider]  rizatriptan (MAXALT) 10 MG tablet Take 10 mg by mouth as needed. May repeat in 2 hours if needed    [name], MD  rosuvastatin (CRESTOR) 5 MG tablet Take 1 tablet (5 mg total) by mouth at bedtime. 01/31/21   Tricia Sizer, MD  theophylline (UNIPHYL) 400 MG 24 hr tablet Take 1 tablet by mouth daily.  12/27/17   Erby Pian, MD  tiotropium (SPIRIVA) 18 MCG inhalation capsule Place 1 capsule into inhaler and inhale daily. pm 08/27/13   Erby Pian, MD  traZODone (DESYREL) 100 MG tablet Take 1 tablet (100 mg total) by mouth at bedtime. 06/05/21   Tricia Sizer, MD  VENTOLIN HFA 108 (90 Base) MCG/ACT inhaler INHALE 1 PUFF BY MOUTH AS NEEDED 04/26/20   Tricia Sizer, MD    Physical Exam: BP (!) 127/54   Pulse (!) 117   Temp 99 F (37.2 C) (Oral)   Resp (!) 21   SpO2 93%   General: 61 y.o. year-old female cachectic, looks older than stated age, ill appearing, but in no acute distress.  Alert and oriented x3. HEENT: NCAT, EOMI Neck: Supple, trachea medial Cardiovascular: Tachycardia.  Regular rate and rhythm with no rubs or gallops.  No thyromegaly or JVD noted.  +2 B/L lower extremity edema. 2/4 pulses in all 4 extremities. Respiratory: Tachypnea.  Decreased breath sounds with some rhonchi in lower lobes clear to auscultation with no wheezes or rales. Good inspiratory effort. Abdomen: Soft, nontender nondistended with normal bowel sounds x4 quadrants. Muskuloskeletal: No cyanosis, clubbing or edema noted bilaterally Neuro: CN II-XII intact, tremors in hands, sensation, reflexes intact Skin: No ulcerative lesions noted or rashes Psychiatry: Judgement and insight appear normal. Mood is appropriate for condition and setting          Labs on Admission:  Basic Metabolic Panel: Recent Labs  Lab 09/08/21 1647  NA 132*  K 2.8*  CL 89*  CO2 37*  GLUCOSE 113*  BUN 10  CREATININE 0.91  CALCIUM 7.7*   Liver Function Tests: Recent Labs  Lab 09/08/21 1647  AST 59*  ALT 36  ALKPHOS 112  BILITOT 0.8  PROT 6.2*  ALBUMIN 2.7*   Recent Labs  Lab 09/08/21 1647  LIPASE 19   No results for input(s): "AMMONIA" in the last 168 hours. CBC: Recent Labs  Lab 09/08/21 1647  WBC 19.0*  NEUTROABS 15.5*  HGB 12.9  HCT 39.2  MCV 93.6  PLT 347   Cardiac Enzymes: No results for input(s): "CKTOTAL", "CKMB", "CKMBINDEX", "TROPONINI" in the last 168 hours.  BNP (last 3 results) Recent Labs    07/06/21 1144 09/01/21 1426  BNP 30.9 50.5  ProBNP (last 3 results) No results for input(s): "PROBNP" in the last 8760  hours.  CBG: No results for input(s): "GLUCAP" in the last 168 hours.  Radiological Exams on Admission: DG Chest 2 View  Result Date: 09/08/2021 CLINICAL DATA:  cough EXAM: CHEST - 2 VIEW COMPARISON:  Chest x-ray May 18, 23. FINDINGS: Streaky bibasilar opacities, increased in conspicuity from the prior chest x-ray. No visible pleural effusions or pneumothorax. Cardiomediastinal silhouette is within normal limits. No acute osseous abnormality. IMPRESSION: Streaky bibasilar opacities, increased in conspicuity from the prior chest x-ray and concerning for infectious bronchiolitis given findings on CT chest. Recommend imaging follow-up to resolution. Electronically Signed   By: Margaretha Sheffield M.D.   On: 09/08/2021 16:22    EKG: I independently viewed the EKG done and my findings are as followed: Sinus tachycardia at rate of 112 bpm  Assessment/Plan Present on Admission:  GAD (generalized anxiety disorder)  Active Problems:   Type 2 diabetes mellitus ([place])   Mixed hyperlipidemia   GAD (generalized anxiety disorder)   Sepsis ([place])   CAP (community acquired pneumonia)   Acute respiratory failure with hypoxia (HCC)   Lactic acidosis   Leukocytosis   Nausea   Hypokalemia   Hyponatremia   Depression   Hypoalbuminemia due to protein-calorie malnutrition ([place])  Sepsis possibly secondary to CAP POA and/or UTI POA Patient was tachypneic, tachycardic, and febrile and has leukocytosis (met SIRS criteria), source of infection was the lung and UTI (met sepsis criteria) Patient was started on cefepime and azithromycin, we shall continue same at this time with plan to de-escalate/discontinue based on blood culture, sputum culture, urine Legionella and strep pneumo  Procalcitonin was 0.35 Urine culture pending Continue Tylenol as needed Continue Mucinex, incentive spirometry, flutter valve   Lactic acidosis Lactic acidosis 2.0, continue to trend lactic acid  Leukocytosis secondary to  sepsis WBC 19.0; Continue treatment as described above for sepsis  Acute respiratory failure with hypoxia Continue supplemental oxygen via  to maintain O2 sats > 94% with plan to wean patient off this as tolerated, patient does not use supplemental oxygen at baseline  Nausea Continue Zofran  Hyponatremia Na 132, IV hydration was provided  Hypokalemia K+ 2.8, this will be replenished  Hypoalbuminemia possibly secondary to moderate protein calorie malnutrition Protein supplement will be provided  COPD Continue Dulera, Singulair, Ventolin   Prediabetes Hemoglobin A1c checked today was 6.0 Continue ISS and hypoglycemia protocol Empagliflozin-metformin will be held at this time Continue diet and lifestyle modification   Mixed hyperlipidemia Continue Crestor  GERD Continue Protonix  Migraine headaches Continue sumatriptan  Anxiety, depression Continue Cymbalta and BuSpar   DVT prophylaxis: Lovenox  Code Status: Full code  Consults: None  Family Communication: None at bedside  Severity of Illness: The appropriate patient status for this patient is INPATIENT. Inpatient status is judged to be reasonable and necessary in order to provide the required intensity of service to ensure the patient's safety. [name] presenting symptoms, physical exam findings, and initial radiographic and laboratory data in the context of their chronic comorbidities is felt to place them at high risk for further clinical deterioration. Furthermore, it is not anticipated that the patient will be medically stable for discharge from the hospital within 2 midnights of admission.   * I certify that at the point of admission it is my clinical judgment that the patient will require inpatient hospital care spanning beyond 2 midnights from the point of admission due to high intensity of service, high  risk for further deterioration and high frequency of surveillance required.*  Author: Bernadette Hoit, DO 09/08/2021 9:42 PM  For on call review [email or web address].

## 2021-09-08 NOTE — ED Provider Triage Note (Signed)
Emergency Medicine Provider Triage Evaluation Note  Tricia [name] , a 61 y.o. female  was evaluated in triage.  Patient has a history of GERD, diabetes, headaches, COPD, asthma, CHF and hypertension, presents to the emergency department referred by her PCP for sepsis.  Patient saw her primary care provider a week ago and was complaining of cough, body aches, vomiting and diarrhea.  Patient has been febrile at home.  Review of Systems  Positive: Patient has cough and fever  Negative: No chest pain or chest tightness.   Physical Exam  There were no vitals taken for this visit. Gen:   Awake, no distress   Resp:  Normal effort  MSK:   Moves extremities without difficulty  Other:    Medical Decision Making  Medically screening exam initiated at 3:43 PM.  Appropriate orders placed.  [name] was informed that the remainder of the evaluation will be completed by another provider, this initial triage assessment does not replace that evaluation, and the importance of remaining in the ED until their evaluation is complete.     Vallarie Mare [place], [place] 09/08/21 1544

## 2021-09-08 NOTE — ED Notes (Signed)
Blood culture 1 drawn in RAC and blood culture 2 drawn in LAC.

## 2021-09-08 NOTE — Sepsis Progress Note (Signed)
Secure chat with bedside RN confirming that blood cultures were drawn before the antibiotic was given.

## 2021-09-09 LAB — COMPREHENSIVE METABOLIC PANEL
ALT: 48 U/L — ABNORMAL HIGH (ref 0–44)
AST: 52 U/L — ABNORMAL HIGH (ref 15–41)
Albumin: 2.3 g/dL — ABNORMAL LOW (ref 3.5–5.0)
Alkaline Phosphatase: 107 U/L (ref 38–126)
Anion gap: 7 (ref 5–15)
BUN: 10 mg/dL (ref 6–20)
CO2: 34 mmol/L — ABNORMAL HIGH (ref 22–32)
Calcium: 7.6 mg/dL — ABNORMAL LOW (ref 8.9–10.3)
Chloride: 94 mmol/L — ABNORMAL LOW (ref 98–111)
Creatinine, Ser: 0.93 mg/dL (ref 0.44–1.00)
GFR, Estimated: >60 mL/min (ref >60)
Glucose, Bld: 102 mg/dL — ABNORMAL HIGH (ref 70–99)
Potassium: 3.4 mmol/L — ABNORMAL LOW (ref 3.5–5.1)
Sodium: 135 mmol/L (ref 135–145)
Total Bilirubin: 0.7 mg/dL (ref 0.3–1.2)
Total Protein: 5.5 g/dL — ABNORMAL LOW (ref 6.5–8.1)

## 2021-09-09 LAB — CBG MONITORING, ED
Glucose-Capillary: 116 mg/dL — ABNORMAL HIGH (ref 70–99)
Glucose-Capillary: 97 mg/dL (ref 70–99)

## 2021-09-09 LAB — GLUCOSE, CAPILLARY
Glucose-Capillary: 165 mg/dL — ABNORMAL HIGH (ref 70–99)
Glucose-Capillary: 264 mg/dL — ABNORMAL HIGH (ref 70–99)
Glucose-Capillary: 239 mg/dL — ABNORMAL HIGH (ref 70–99)

## 2021-09-09 LAB — HIV ANTIBODY (ROUTINE TESTING W REFLEX): HIV Screen 4th Generation wRfx: NONREACTIVE

## 2021-09-09 LAB — LACTIC ACID, PLASMA
Lactic Acid, Venous: 4.7 mmol/L (ref 0.5–1.9)
Lactic Acid, Venous: 1.3 mmol/L (ref 0.5–1.9)

## 2021-09-09 LAB — PROCALCITONIN: Procalcitonin: 0.41 ng/mL

## 2021-09-09 LAB — STREP PNEUMONIAE URINARY ANTIGEN: Strep Pneumo Urinary Antigen: POSITIVE — AB

## 2021-09-09 LAB — MAGNESIUM: Magnesium: 1.7 mg/dL (ref 1.7–2.4)

## 2021-09-09 LAB — EXPECTORATED SPUTUM ASSESSMENT W GRAM STAIN, RFLX TO RESP C

## 2021-09-09 LAB — APTT: aPTT: 27 seconds (ref 24–36)

## 2021-09-09 MED ORDER — PANTOPRAZOLE SODIUM 40 MG PO TBEC
40.0000 mg | DELAYED_RELEASE_TABLET | Freq: Every day | ORAL | Status: DC
  Administered 2021-09-09 – 2021-09-10 (×2): 40 mg via ORAL
  Filled 2021-09-09 (×2): qty 1

## 2021-09-09 MED ORDER — ALBUTEROL SULFATE (2.5 MG/3ML) 0.083% IN NEBU: 3.0000 mL | INHALATION_SOLUTION | Freq: Four times a day (QID) | RESPIRATORY_TRACT | Status: DC | PRN

## 2021-09-09 MED ORDER — MORPHINE SULFATE ER 30 MG PO TBCR
30.0000 mg | EXTENDED_RELEASE_TABLET | Freq: Two times a day (BID) | ORAL | Status: DC
Start: 2021-09-09 — End: 2021-09-09

## 2021-09-09 MED ORDER — ROSUVASTATIN CALCIUM 5 MG PO TABS
5.0000 mg | ORAL_TABLET | Freq: Every day | ORAL | Status: DC
  Administered 2021-09-09: 5 mg via ORAL
  Filled 2021-09-09: qty 1

## 2021-09-09 MED ORDER — MORPHINE SULFATE ER 15 MG PO TBCR
30.0000 mg | EXTENDED_RELEASE_TABLET | Freq: Two times a day (BID) | ORAL | Status: DC
  Administered 2021-09-09 – 2021-09-10 (×3): 30 mg via ORAL
  Filled 2021-09-09 (×3): qty 2

## 2021-09-09 MED ORDER — MOMETASONE FURO-FORMOTEROL FUM 200-5 MCG/ACT IN AERO
2.0000 | INHALATION_SPRAY | Freq: Two times a day (BID) | RESPIRATORY_TRACT | Status: DC
  Administered 2021-09-09 – 2021-09-10 (×2): 2 via RESPIRATORY_TRACT
  Filled 2021-09-09 (×2): qty 8.8

## 2021-09-09 MED ORDER — IPRATROPIUM-ALBUTEROL 0.5-2.5 (3) MG/3ML IN SOLN
3.0000 mL | Freq: Four times a day (QID) | RESPIRATORY_TRACT | Status: DC
Start: 2021-09-10 — End: 2021-09-10

## 2021-09-09 MED ORDER — ALBUTEROL SULFATE (2.5 MG/3ML) 0.083% IN NEBU
3.0000 mL | INHALATION_SOLUTION | RESPIRATORY_TRACT | Status: DC | PRN
Start: 2021-09-09 — End: 2021-09-10

## 2021-09-09 MED ORDER — ONDANSETRON HCL 4 MG PO TABS: 4.0000 mg | ORAL_TABLET | Freq: Four times a day (QID) | ORAL | Status: DC | PRN

## 2021-09-09 MED ORDER — ACETAMINOPHEN 650 MG RE SUPP: 650.0000 mg | Freq: Four times a day (QID) | RECTAL | Status: DC | PRN

## 2021-09-09 MED ORDER — POTASSIUM CHLORIDE CRYS ER 20 MEQ PO TBCR
40.0000 meq | EXTENDED_RELEASE_TABLET | Freq: Once | ORAL | Status: AC
  Administered 2021-09-09: 40 meq via ORAL
  Filled 2021-09-09: qty 2

## 2021-09-09 MED ORDER — PREDNISONE 20 MG PO TABS
40.0000 mg | ORAL_TABLET | Freq: Every day | ORAL | Status: DC
  Administered 2021-09-09 – 2021-09-10 (×2): 40 mg via ORAL
  Filled 2021-09-09 (×2): qty 2

## 2021-09-09 MED ORDER — ENOXAPARIN SODIUM 40 MG/0.4ML IJ SOSY
40.0000 mg | PREFILLED_SYRINGE | INTRAMUSCULAR | Status: DC
  Administered 2021-09-09 – 2021-09-10 (×2): 40 mg via SUBCUTANEOUS
  Filled 2021-09-09 (×2): qty 0.4

## 2021-09-09 MED ORDER — MONTELUKAST SODIUM 10 MG PO TABS
10.0000 mg | ORAL_TABLET | Freq: Every day | ORAL | Status: DC
  Administered 2021-09-09: 10 mg via ORAL
  Filled 2021-09-09: qty 1

## 2021-09-09 MED ORDER — SUMATRIPTAN SUCCINATE 50 MG PO TABS: 100.0000 mg | ORAL_TABLET | ORAL | Status: DC | PRN

## 2021-09-09 MED ORDER — DULOXETINE HCL 30 MG PO CPEP
30.0000 mg | ORAL_CAPSULE | Freq: Every day | ORAL | Status: DC
  Administered 2021-09-09 – 2021-09-10 (×2): 30 mg via ORAL
  Filled 2021-09-09 (×3): qty 1

## 2021-09-09 MED ORDER — GABAPENTIN 600 MG PO TABS
1200.0000 mg | ORAL_TABLET | Freq: Three times a day (TID) | ORAL | Status: DC
Start: 2021-09-09 — End: 2021-09-10
  Administered 2021-09-09 – 2021-09-10 (×5): 1200 mg via ORAL
  Filled 2021-09-09 (×5): qty 2

## 2021-09-09 MED ORDER — ACETAMINOPHEN 325 MG PO TABS
650.0000 mg | ORAL_TABLET | Freq: Four times a day (QID) | ORAL | Status: DC | PRN
  Administered 2021-09-10: 650 mg via ORAL
  Filled 2021-09-09 (×2): qty 2

## 2021-09-09 MED ORDER — SODIUM CHLORIDE 0.9 % IV SOLN
1.0000 g | INTRAVENOUS | Status: DC
  Administered 2021-09-09: 1 g via INTRAVENOUS
  Filled 2021-09-09 (×2): qty 10

## 2021-09-09 MED ORDER — TRAZODONE HCL 100 MG PO TABS
100.0000 mg | ORAL_TABLET | Freq: Every day | ORAL | Status: DC
  Administered 2021-09-09: 100 mg via ORAL
  Filled 2021-09-09: qty 1

## 2021-09-09 MED ORDER — ONDANSETRON HCL 4 MG/2ML IJ SOLN: 4.0000 mg | Freq: Four times a day (QID) | INTRAMUSCULAR | Status: DC | PRN

## 2021-09-09 MED ORDER — CARBIDOPA-LEVODOPA 25-100 MG PO TABS
1.5000 | ORAL_TABLET | Freq: Three times a day (TID) | ORAL | Status: DC
Start: 2021-09-09 — End: 2021-09-10
  Administered 2021-09-09 – 2021-09-10 (×5): 1.5 via ORAL
  Filled 2021-09-09 (×5): qty 2

## 2021-09-09 MED ORDER — IPRATROPIUM-ALBUTEROL 0.5-2.5 (3) MG/3ML IN SOLN
3.0000 mL | Freq: Four times a day (QID) | RESPIRATORY_TRACT | Status: DC
  Administered 2021-09-09 (×2): 3 mL via RESPIRATORY_TRACT
  Filled 2021-09-09 (×2): qty 3

## 2021-09-09 MED ORDER — BUSPIRONE HCL 15 MG PO TABS
7.5000 mg | ORAL_TABLET | Freq: Two times a day (BID) | ORAL | Status: DC
  Administered 2021-09-09 – 2021-09-10 (×3): 7.5 mg via ORAL
  Filled 2021-09-09 (×3): qty 1

## 2021-09-09 NOTE — ED Notes (Signed)
Received report from [employer].

## 2021-09-09 NOTE — Plan of Care (Signed)
  Problem: Activity: Goal: Risk for activity intolerance will decrease Outcome: Progressing   Problem: Coping: Goal: Level of anxiety will decrease Outcome: Progressing   Problem: Pain Managment: Goal: General experience of comfort will improve Outcome: Progressing   

## 2021-09-09 NOTE — Progress Notes (Signed)
Pharmacy Antibiotic Note  Tricia [name] is a 61 y.o. female admitted on 09/08/2021 with pneumonia/UTI.  Pharmacy has been consulted for Cefepime dosing.  -ED visit 09/03/21- IV abx and d/c on zpak for PNA -also ordered Azithromycin -complained of 1 week onset of burning sensation on urination  Plan: Cefepime 2 gm IV q8h      Temp (24hrs), Avg:99.8 F (37.7 C), Min:98.9 F (37.2 C), Max:101.4 F (38.6 C)  Recent Labs  Lab 09/08/21 1647  WBC 19.0*  CREATININE 0.91  LATICACIDVEN 2.0*    Estimated Creatinine Clearance: 64.9 mL/min (by C-G formula based on SCr of 0.91 mg/dL).    Allergies  Allergen Reactions   Augmentin [Amoxicillin-Pot Clavulanate] Diarrhea   Penicillins Itching    Antimicrobials this admission: Cefepime  7/21 >>   Azithromycin  7/21 >>    Dose adjustments this admission:    Microbiology results: 7/21 BCx: pend   UCx:      Sputum:      MRSA PCR:    Thank you for allowing pharmacy to be a part of this patient's care.  Gilman Olazabal A 09/09/2021 12:39 AM

## 2021-09-09 NOTE — Progress Notes (Addendum)
PROGRESS NOTE    Tricia [name]  YKD:[id number] DOB: 1961-01-18 DOA: 09/08/2021 PCP: [name], MD     Brief Narrative:   From admission h and p Tricia [name] is a 61 y.o. female with medical history significant of COPD, prediabetes, hyperlipidemia, GERD, migraine headaches, anxiety, depression, benign essential tremor who presents to the emergency department due to shortness of breath and sepsis.  Patient presented to emergency department at [facility] on 09/03/2021 due to shortness of breath and she was diagnosed with pneumonia after CT of chest and labs were reviewed, she was treated with IV antibiotics and was going to be admitted to the hospital, but patient declined, so she was discharged with Z-Pak and prednisone for 4 days.  She continued to have shortness of breath and she presented to her PCPs office today where she was noted to be hypoxic with an O2 sat of 84% on room air, she was tachycardic with HR of 113 and febrile with temperature of 101.20F.  She complained of increased shortness of breath, nausea and change in appetite.  She was asked to go to the ED from PCPs office for further evaluation and management.  Patient also complained of 1 week onset of burning sensation on urination   Assessment & Plan:   Active Problems:   Chronic diastolic heart failure (HCC)   Type 2 diabetes mellitus ([place])   Essential (primary) hypertension   Mixed hyperlipidemia   GAD (generalized anxiety disorder)   Parkinson's disease ([place])   Sepsis ([place])   CAP (community acquired pneumonia)   Acute respiratory failure with hypoxia ([place])   Lactic acidosis   Leukocytosis   Nausea   Hypokalemia   Hyponatremia   Depression   Hypoalbuminemia due to protein-calorie malnutrition ([place])   # CAP secondary to strep pneumonia # Sepsis # Acute hypoxic respiratory failure 2 weeks worsening cough and dyspnea. Diagnosed with CAP 7/16 treated with azithromycin. Symptoms worsened,  febrile at PCP's yesterday. Here tachycardic, elevated lactate that has normalized with fluids. Strep antigen positive. - f/u blood and sputum cultures - stop cefepime, start ceftriaxone - cont [place] o2, wean as able  # COPD With CAP as above - prednisone 40 qd for home 20 - home singulair - duonebs  # Parkinson's disease - cont home sinemet  # T2DM Here euglycemic - SSI - hold home empagliflozin/metformin  # Diabetic neuropathy # Chronic pain - cont home gabapentin, ms contin, duloxetine  # Hypokalemia - Monitor and replete as needed  # GAD - cont home buspar, cymbalta   DVT prophylaxis: lovenox Code Status: full Family Communication: none @ bedside. No answer when daughter called.  Level of care: Telemetry Medical Status is: Inpatient Remains inpatient appropriate because: need for IV abx    Consultants:  none  Procedures: none  Antimicrobials:  ceftriaxone    Subjective: This morning says breathing improved, no chest pain  Objective: Vitals:   09/09/21 0400 09/09/21 0430 09/09/21 0600 09/09/21 0843  BP: (!) 148/76 (!) 148/76 134/65 125/77  Pulse: 90 87 87 91  Resp:   18   Temp:   98.3 F (36.8 C) 98.5 F (36.9 C)  TempSrc:   Oral   SpO2: 94% 94% 96% 94%    Intake/Output Summary (Last 24 hours) at 09/09/2021 1016 Last data filed at 09/09/2021 1000 Gross per 24 hour  Intake 140 ml  Output --  Net 140 ml   There were no vitals filed for this visit.  Examination:  General  exam: Appears calm and comfortable  Respiratory system: scattered rhonchi and exp wheeze Cardiovascular system: S1 & S2 heard, RRR. No JVD, murmurs, rubs, gallops or clicks. No pedal edema. Gastrointestinal system: Abdomen is nondistended, soft and nontender. No organomegaly or masses felt. Normal bowel sounds heard. Central nervous system: Alert and oriented. No focal neurological deficits. Extremities: Symmetric 5 x 5 power. Skin: pitting edema LEs Psychiatry: Judgement  and insight appear normal. Mood & affect appropriate.     Data Reviewed: I have personally reviewed following labs and imaging studies  CBC: Recent Labs  Lab 09/08/21 1647  WBC 19.0*  NEUTROABS 15.5*  HGB 12.9  HCT 39.2  MCV 93.6  PLT 709   Basic Metabolic Panel: Recent Labs  Lab 09/08/21 1647 09/09/21 0607  NA 132* 135  K 2.8* 3.4*  CL 89* 94*  CO2 37* 34*  GLUCOSE 113* 102*  BUN 10 10  CREATININE 0.91 0.93  CALCIUM 7.7* 7.6*   GFR: Estimated Creatinine Clearance: 63.5 mL/min (by C-G formula based on SCr of 0.93 mg/dL). Liver Function Tests: Recent Labs  Lab 09/08/21 1647 09/09/21 0607  AST 59* 52*  ALT 36 48*  ALKPHOS 112 107  BILITOT 0.8 0.7  PROT 6.2* 5.5*  ALBUMIN 2.7* 2.3*   Recent Labs  Lab 09/08/21 1647  LIPASE 19   No results for input(s): "AMMONIA" in the last 168 hours. Coagulation Profile: No results for input(s): "INR", "PROTIME" in the last 168 hours. Cardiac Enzymes: No results for input(s): "CKTOTAL", "CKMB", "CKMBINDEX", "TROPONINI" in the last 168 hours. BNP (last 3 results) No results for input(s): "PROBNP" in the last 8760 hours. HbA1C: Recent Labs    09/08/21 1445  HGBA1C 6.0*   CBG: Recent Labs  Lab 09/09/21 0120 09/09/21 0737  GLUCAP 116* 97   Lipid Profile: No results for input(s): "CHOL", "HDL", "LDLCALC", "TRIG", "CHOLHDL", "LDLDIRECT" in the last 72 hours. Thyroid Function Tests: No results for input(s): "TSH", "T4TOTAL", "FREET4", "T3FREE", "THYROIDAB" in the last 72 hours. Anemia Panel: No results for input(s): "VITAMINB12", "FOLATE", "FERRITIN", "TIBC", "IRON", "RETICCTPCT" in the last 72 hours. Urine analysis:    Component Value Date/Time   COLORURINE YELLOW (A) 09/08/2021 1647   APPEARANCEUR CLOUDY (A) 09/08/2021 1647   APPEARANCEUR Cloudy (A) 01/24/2015 0000   LABSPEC 1.006 09/08/2021 1647   LABSPEC 1.005 11/06/2012 2335   PHURINE 6.0 09/08/2021 1647   GLUCOSEU 50 (A) 09/08/2021 1647   GLUCOSEU  Negative 11/06/2012 2335   HGBUR MODERATE (A) 09/08/2021 1647   BILIRUBINUR NEGATIVE 09/08/2021 1647   BILIRUBINUR Small 07/06/2021 1031   BILIRUBINUR Negative 01/24/2015 0000   BILIRUBINUR Negative 11/06/2012 2335   KETONESUR NEGATIVE 09/08/2021 1647   PROTEINUR NEGATIVE 09/08/2021 1647   UROBILINOGEN 0.2 07/06/2021 1031   NITRITE NEGATIVE 09/08/2021 1647   LEUKOCYTESUR LARGE (A) 09/08/2021 1647   LEUKOCYTESUR 3+ 11/06/2012 2335   Sepsis Labs: '@LABRCNTIP'$ (procalcitonin:4,lacticidven:4)  ) Recent Results (from the past 240 hour(s))  Resp Panel by RT-PCR (Flu A&B, Covid) Anterior Nasal Swab     Status: None   Collection Time: 09/08/21  4:47 PM   Specimen: Anterior Nasal Swab  Result Value Ref Range Status   SARS Coronavirus 2 by RT PCR NEGATIVE NEGATIVE Final    Comment: (NOTE) SARS-CoV-2 target nucleic acids are NOT DETECTED.  The SARS-CoV-2 RNA is generally detectable in upper respiratory specimens during the acute phase of infection. The lowest concentration of SARS-CoV-2 viral copies this assay can detect is 138 copies/mL. A negative result does not preclude  SARS-Cov-2 infection and should not be used as the sole basis for treatment or other patient management decisions. A negative result may occur with  improper specimen collection/handling, submission of specimen other than nasopharyngeal swab, presence of viral mutation(s) within the areas targeted by this assay, and inadequate number of viral copies(<138 copies/mL). A negative result must be combined with clinical observations, patient history, and epidemiological information. The expected result is Negative.  Fact Sheet for Patients:  EntrepreneurPulse.com.au  Fact Sheet for Healthcare Providers:  IncredibleEmployment.be  This test is no t yet approved or cleared by the [place] FDA and  has been authorized for detection and/or diagnosis of SARS-CoV-2 by FDA under an  Emergency Use Authorization (EUA). This EUA will remain  in effect (meaning this test can be used) for the duration of the COVID-19 declaration under Section 564(b)(1) of the Act, 21 U.S.C.section 360bbb-3(b)(1), unless the authorization is terminated  or revoked sooner.       Influenza A by PCR NEGATIVE NEGATIVE Final   Influenza B by PCR NEGATIVE NEGATIVE Final    Comment: (NOTE) The Xpert Xpress SARS-CoV-2/FLU/RSV plus assay is intended as an aid in the diagnosis of influenza from Nasopharyngeal swab specimens and should not be used as a sole basis for treatment. Nasal washings and aspirates are unacceptable for Xpert Xpress SARS-CoV-2/FLU/RSV testing.  Fact Sheet for Patients: EntrepreneurPulse.com.au  Fact Sheet for Healthcare Providers: IncredibleEmployment.be  This test is not yet approved or cleared by the [place] FDA and has been authorized for detection and/or diagnosis of SARS-CoV-2 by FDA under an Emergency Use Authorization (EUA). This EUA will remain in effect (meaning this test can be used) for the duration of the COVID-19 declaration under Section 564(b)(1) of the Act, 21 U.S.C. section 360bbb-3(b)(1), unless the authorization is terminated or revoked.  Performed at [facility], [address] [place], [place] [address]   Expectorated Sputum Assessment w Gram Stain, Rflx to Resp Cult     Status: None   Collection Time: 09/09/21 12:35 AM   Specimen: Expectorated Sputum  Result Value Ref Range Status   Specimen Description EXPECTORATED SPUTUM  Final   Special Requests NONE  Final   Sputum evaluation   Final    THIS SPECIMEN IS ACCEPTABLE FOR SPUTUM CULTURE Performed at [facility], [address] [place], [place] [address]    Report Status 09/09/2021 FINAL  Final  Culture, blood (Routine X 2) w Reflex to ID Panel     Status: None (Preliminary result)   Collection Time: 09/09/21 12:35 AM    Specimen: BLOOD  Result Value Ref Range Status   Specimen Description BLOOD RIGHT ANTECUBITAL  Final   Special Requests   Final    BOTTLES DRAWN AEROBIC AND ANAEROBIC Blood Culture adequate volume   Culture   Final    NO GROWTH < 12 HOURS Performed at [facility], [address] [place], [place] [address]    Report Status PENDING  Incomplete  Culture, blood (Routine X 2) w Reflex to ID Panel     Status: None (Preliminary result)   Collection Time: 09/09/21 12:35 AM   Specimen: BLOOD  Result Value Ref Range Status   Specimen Description BLOOD BLOOD RIGHT HAND  Final   Special Requests   Final    BOTTLES DRAWN AEROBIC AND ANAEROBIC Blood Culture results may not be optimal due to an inadequate volume of blood received in culture bottles   Culture   Final    NO  GROWTH < 12 HOURS Performed at [facility], [address], [place], [place] [address]    Report Status PENDING  Incomplete         Radiology Studies: DG Chest 2 View  Result Date: 09/08/2021 CLINICAL DATA:  cough EXAM: CHEST - 2 VIEW COMPARISON:  Chest x-ray May 18, 23. FINDINGS: Streaky bibasilar opacities, increased in conspicuity from the prior chest x-ray. No visible pleural effusions or pneumothorax. Cardiomediastinal silhouette is within normal limits. No acute osseous abnormality. IMPRESSION: Streaky bibasilar opacities, increased in conspicuity from the prior chest x-ray and concerning for infectious bronchiolitis given findings on CT chest. Recommend imaging follow-up to resolution. Electronically Signed   By: Margaretha Sheffield M.D.   On: 09/08/2021 16:22        Scheduled Meds:  busPIRone  7.5 mg Oral BID   dextromethorphan-guaiFENesin  1 tablet Oral BID   DULoxetine  30 mg Oral Daily   enoxaparin (LOVENOX) injection  40 mg Subcutaneous Q24H   feeding supplement (GLUCERNA SHAKE)  237 mL Oral TID BM   insulin aspart  0-9 Units Subcutaneous TID WC   mometasone-formoterol  2 puff Inhalation  BID   montelukast  10 mg Oral QHS   morphine  30 mg Oral Q12H   pantoprazole  40 mg Oral Daily   potassium chloride  40 mEq Oral Once   potassium chloride  40 mEq Oral Once   rosuvastatin  5 mg Oral QHS   Continuous Infusions:  azithromycin     ceFEPime (MAXIPIME) IV 2 g (09/09/21 0914)   lactated ringers 150 mL/hr at 09/08/21 2230     LOS: 1 day     Desma Maxim, MD [facility]   If 7PM-7AM, please contact night-coverage [email or web address] Password [facility] 09/09/2021, 10:16 AM

## 2021-09-09 NOTE — Evaluation (Signed)
Physical Therapy Evaluation Patient Details Name: Tricia [name] MRN: [id number] DOB: October 21, 1960 Today's Date: 09/09/2021  History of Present Illness  Tricia [name] is a 61 y.o. female with medical history significant of COPD, prediabetes, hyperlipidemia, GERD, migraine headaches, anxiety, depression, benign essential tremor who presents to the emergency department due to shortness of breath and sepsis.  Strep antigen positive.   Clinical Impression  Patient received in recliner visiting with daughter and friend. Patient alert and oriented and able to provide a detailed history. Up to now patient has been living at home alone with her dogs and cats but is expecting to discharge to her daughter's home where she will be living from now on. This is a single story home with 3 steps to enter with B handrails. She reports 3-4 falls in the last week and although she is unsure what is causing her to fall, she reports worsening symptoms of difficulty initiating voluntary movement in the last 2 weeks. She estimates she has fallen 8 times in the last 6 months. Prior to hospitalization, she ambulated with and without her rolling walker and was ADLs and IADLs including driving. Upon PT evaluation, she needed 2L/min O2 to keep SpO2 above 88% with mobility. It dropped to 84% when walking on room air. She was mod I for bed mobility and transfers and ambulation with RW, and supervision for stairs with use of B handrails. She needed cuing for safe positioning of RW and reached for objects for support when not using RW while transferring. Patient appears to have sufficient mobility for discharge home with supervision when standing and ambulating. She would benefit from outpatient PT to improve her balance and decrease falls. Patient would benefit from skilled physical therapy to address impairments and functional limitations (see PT Problem List below) to work towards stated goals and return to PLOF or maximal  functional independence.       Recommendations for follow up therapy are one component of a multi-disciplinary discharge planning process, led by the attending physician.  Recommendations may be updated based on patient status, additional functional criteria and insurance authorization.  Follow Up Recommendations Outpatient PT      Assistance Recommended at Discharge Frequent or constant Supervision/Assistance  Patient can return home with the following  A little help with walking and/or transfers;A little help with bathing/dressing/bathroom;Assistance with cooking/housework;Help with stairs or ramp for entrance    Equipment Recommendations None recommended by PT  Recommendations for Other Services       Functional Status Assessment Patient has had a recent decline in their functional status and demonstrates the ability to make significant improvements in function in a reasonable and predictable amount of time.     Precautions / Restrictions Precautions Precautions: Fall Restrictions Weight Bearing Restrictions: No      Mobility  Bed Mobility Overal bed mobility: Modified Independent             General bed mobility comments: supine <> sit with increased time/effort    Transfers Overall transfer level: Needs assistance Equipment used: Rolling walker (2 wheels) Transfers: Sit to/from Stand Sit to Stand: Supervision           General transfer comment: sit <> stand from edge of bed and chair with modified independence to RW. Needs extra time and cuing to use RW.    Ambulation/Gait Ambulation/Gait assistance: Supervision Gait Distance (Feet): 150 Feet Assistive device: Rolling walker (2 wheels) Gait Pattern/deviations: WFL(Within Functional Limits) (Mild tremor and decreased motor control at times.)  Gait velocity: slow     General Gait Details: Patient ambulated 2x150 feet using RW and supervision with seated rest between sets to apply 2L/min O2. Pateint's  SpO2 dropped to 84% during ambulation on room air, and patient was asymptomatic. SpO2 at 93% by end of second walk while using 2L/min O2.  Stairs Stairs: Yes Stairs assistance: Supervision Stair Management: Alternating pattern, Forwards, Step to pattern, Two rails Number of Stairs: 8 General stair comments: Patient ascended and descended 2x4 steps with B UE support on handrails and supervision for safety. Pateint used step over step gait pattern on ascent and step to pattern leading with R LE on descent.  Wheelchair Mobility    Modified Rankin (Stroke Patients Only)       Balance Overall balance assessment: Needs assistance Sitting-balance support: No upper extremity supported, Feet supported Sitting balance-Leahy Scale: Normal       Standing balance-Leahy Scale: Fair Standing balance comment: Patient able to shift weight and take a step or two without UE support but reaches for support and is reliant on RW while ambulating to prevent LOB.                             Pertinent Vitals/Pain Pain Assessment Pain Assessment: 0-10 Pain Score: 8  Pain Location: chronic low back and knee pain Pain Intervention(s): Limited activity within patient's tolerance, Monitored during session    [place] expects to be discharged to:: Private residence (patient planning to discharge to her daughter's home but has been living in her own home alone (with her dogs and cats).) Living Arrangements: Children (will be moving in with daughter Colletta [place]) Available Help at Discharge: Available PRN/intermittently (works 5 min away 2x a day, otherwise at home 24/7) Type of Home: House Home Access: Stairs to enter Entrance Stairs-Rails: Right;Left;Can reach both [employer] of Steps: 3 (screen door closes on her)   Home Layout: One level Home Equipment: Wheelchair - Publishing copy (2 wheels);Rollator (4 wheels);Other (comment);Shower seat;Grab bars -  tub/shower Additional Comments: Upright walker. Denies using O2 at home.    Prior Function Prior Level of Function : History of Falls (last six months);Driving             Mobility Comments: Ambulates with no AD and sometimes with RW in her home and no AD in the communinty. . She states she falls when she wears house shoes. She has fallen 3-4 falls this week alone. She feels like her legs don't move when she wants them to, which has worsened in the last 2 weeks. She thinks she has fallen about 8 times in the last 6 months. Her daughter states she has trouble getting into the home because the screen door closes on her her. She seems unsure how she is falling but does note difficulty with coordination or moving at times. ADLs Comments: She was I with ADLs and IADLs     Hand Dominance   Dominant Hand: Right    Extremity/Trunk Assessment   Upper Extremity Assessment Upper Extremity Assessment: Overall WFL for tasks assessed (except bilateral UE tremor)    Lower Extremity Assessment Lower Extremity Assessment: RLE deficits/detail;LLE deficits/detail (Strength appears WFL, demo tremor and reports having difficulty initiating movement at times)    Cervical / Trunk Assessment Cervical / Trunk Assessment: Kyphotic (significantly forward head)  Communication   Communication: No difficulties  Cognition Arousal/Alertness: Awake/alert Behavior During Therapy: WFL for tasks assessed/performed Overall Cognitive Status: Within  Functional Limits for tasks assessed                                          General Comments General comments (skin integrity, edema, etc.): Pateint's SpO2 88% at rest and dropped to 84% without her feeling symptoms while ambulating. It was 93% after ambulating when placed on 2L/min O2.    Exercises Other Exercises Other Exercises: educated patient on role of PT in acute care setting, discharge reccomendations.   Assessment/Plan    PT  Assessment Patient needs continued PT services  PT Problem List Decreased coordination;Cardiopulmonary status limiting activity;Decreased balance;Decreased safety awareness;Decreased mobility;Decreased knowledge of use of DME;Decreased activity tolerance;Decreased strength       PT Treatment Interventions DME instruction;Balance training;Gait training;Neuromuscular re-education;Stair training;Functional mobility training;Patient/family education;Therapeutic activities;Therapeutic exercise    PT Goals (Current goals can be found in the Care Plan section)  Acute Rehab PT Goals Patient Stated Goal: to go home PT Goal Formulation: With patient Time For Goal Achievement: 09/23/21 Potential to Achieve Goals: Good    Frequency Min 2X/week     Co-evaluation               AM-PAC PT "6 Clicks" Mobility  Outcome Measure Help needed turning from your back to your side while in a flat bed without using bedrails?: A Little Help needed moving from lying on your back to sitting on the side of a flat bed without using bedrails?: A Little Help needed moving to and from a bed to a chair (including a wheelchair)?: A Little Help needed standing up from a chair using your arms (e.g., wheelchair or bedside chair)?: A Little Help needed to walk in hospital room?: A Little Help needed climbing 3-5 steps with a railing? : A Little 6 Click Score: 18    End of Session Equipment Utilized During Treatment: Oxygen Activity Tolerance: Patient tolerated treatment well Patient left: in chair;with call bell/phone within reach;with chair alarm set Nurse Communication: Mobility status PT Visit Diagnosis: Unsteadiness on feet (R26.81);Repeated falls (R29.6);Other abnormalities of gait and mobility (R26.89);History of falling (Z91.81);Other symptoms and signs involving the nervous system (R29.898)    Time: 9371-6967 PT Time Calculation (min) (ACUTE ONLY): 42 min   Charges:   PT Evaluation $PT Eval Moderate  Complexity: 1 Mod PT Treatments $Gait Training: 8-22 mins        Everlean Alstrom. Graylon Good, PT, DPT 09/09/21, 3:40 PM

## 2021-09-09 NOTE — ED Notes (Signed)
Informed RN bed assigned 

## 2021-09-10 LAB — CBC
HCT: 32 % — ABNORMAL LOW (ref 36.0–46.0)
Hemoglobin: 10.8 g/dL — ABNORMAL LOW (ref 12.0–15.0)
MCH: 31.4 pg (ref 26.0–34.0)
MCHC: 33.8 g/dL (ref 30.0–36.0)
MCV: 93 fL (ref 80.0–100.0)
Platelets: 217 10*3/uL (ref 150–400)
RBC: 3.44 MIL/uL — ABNORMAL LOW (ref 3.87–5.11)
RDW: 13.7 % (ref 11.5–15.5)
WBC: 8.4 10*3/uL (ref 4.0–10.5)
nRBC: 0 % (ref 0.0–0.2)

## 2021-09-10 LAB — GLUCOSE, CAPILLARY
Glucose-Capillary: 217 mg/dL — ABNORMAL HIGH (ref 70–99)
Glucose-Capillary: 238 mg/dL — ABNORMAL HIGH (ref 70–99)

## 2021-09-10 LAB — COMPREHENSIVE METABOLIC PANEL
ALT: 11 U/L (ref 0–44)
AST: 43 U/L — ABNORMAL HIGH (ref 15–41)
Albumin: 2.1 g/dL — ABNORMAL LOW (ref 3.5–5.0)
Alkaline Phosphatase: 104 U/L (ref 38–126)
Anion gap: 7 (ref 5–15)
BUN: 13 mg/dL (ref 6–20)
CO2: 31 mmol/L (ref 22–32)
Calcium: 7.9 mg/dL — ABNORMAL LOW (ref 8.9–10.3)
Chloride: 96 mmol/L — ABNORMAL LOW (ref 98–111)
Creatinine, Ser: 0.73 mg/dL (ref 0.44–1.00)
GFR, Estimated: >60 mL/min (ref >60)
Glucose, Bld: 249 mg/dL — ABNORMAL HIGH (ref 70–99)
Potassium: 3.8 mmol/L (ref 3.5–5.1)
Sodium: 134 mmol/L — ABNORMAL LOW (ref 135–145)
Total Bilirubin: 0.6 mg/dL (ref 0.3–1.2)
Total Protein: 4.9 g/dL — ABNORMAL LOW (ref 6.5–8.1)

## 2021-09-10 MED ORDER — INSULIN GLARGINE-YFGN 100 UNIT/ML ~~LOC~~ SOLN
5.0000 [IU] | Freq: Every day | SUBCUTANEOUS | Status: DC
  Administered 2021-09-10: 5 [IU] via SUBCUTANEOUS
  Filled 2021-09-10 (×2): qty 0.05

## 2021-09-10 MED ORDER — CEFDINIR 300 MG PO CAPS: 300.0000 mg | ORAL_CAPSULE | Freq: Two times a day (BID) | ORAL | 0 refills | Status: DC

## 2021-09-10 MED ORDER — PREDNISONE 20 MG PO TABS: 40.0000 mg | ORAL_TABLET | Freq: Every day | ORAL | 0 refills | Status: DC

## 2021-09-10 MED ORDER — IPRATROPIUM-ALBUTEROL 0.5-2.5 (3) MG/3ML IN SOLN
3.0000 mL | Freq: Three times a day (TID) | RESPIRATORY_TRACT | Status: DC
Start: 2021-09-10 — End: 2021-09-10
  Administered 2021-09-10: 3 mL via RESPIRATORY_TRACT
  Filled 2021-09-10: qty 3

## 2021-09-10 MED ORDER — IPRATROPIUM-ALBUTEROL 0.5-2.5 (3) MG/3ML IN SOLN
3.0000 mL | Freq: Two times a day (BID) | RESPIRATORY_TRACT | Status: DC
Start: 2021-09-10 — End: 2021-09-10
  Administered 2021-09-10: 3 mL via RESPIRATORY_TRACT
  Filled 2021-09-10: qty 3

## 2021-09-10 MED ORDER — SODIUM CHLORIDE 0.9 % IV SOLN
1.0000 g | INTRAVENOUS | Status: DC
  Administered 2021-09-10: 1 g via INTRAVENOUS
  Filled 2021-09-10 (×2): qty 10

## 2021-09-10 NOTE — Progress Notes (Signed)
IV/Telemetry discontinue, discharge paperwork explain. Pt states no questions or concerns at this time.

## 2021-09-10 NOTE — Progress Notes (Signed)
O2 walking test. Patient on room air  at rest 93% ,during ambulation 89% on room air.

## 2021-09-10 NOTE — Progress Notes (Signed)
Occupational Therapy * Physical Therapy * Speech Therapy          DATE: 09/10/21 PATIENT NAME: Tricia [name], Tricia [name]. DOB: [birth year] PATIENT MRN: #[id number]  DIAGNOSIS/DIAGNOSIS CODE: J18.9 DATE OF DISCHARGE: 09/10/21  PRIMARY CARE PHYSICIAN: Dr. [name] PCP PHONE/FAX: [phone]  Fax: [phone]    Dear Provider [place]   Fax: [phone]   I certify that I have examined this patient and that occupational/physical/speech therapy is necessary on an outpatient basis.    The patient has expressed interest in completing their recommended course of therapy at your location.  Once a formal order from the patient's primary care physician has been obtained, please contact him/her to schedule an appointment for evaluation at your earliest convenience.   [  X]  Physical Therapy Evaluate and Treat          [  ]  Occupational Therapy Evaluate and Treat                                          [  ]  Speech Therapy Evaluate and Treat       [name] primary care physician (listed above) must furnish and be responsible for a formal order such that the recommended services may be furnished while under the primary physician's care, and that the plan of care will be established and reviewed every 30 days (or more often if condition necessitates).

## 2021-09-10 NOTE — Plan of Care (Signed)
  Problem: Fluid Volume: Goal: Ability to maintain a balanced intake and output will improve Outcome: Progressing   Problem: Nutritional: Goal: Maintenance of adequate nutrition will improve Outcome: Progressing   

## 2021-09-10 NOTE — TOC Transition Note (Signed)
Transition of Care [facility]) - CM/SW Discharge Note   Patient Details  Name: Tricia [name] MRN: [id number] Date of Birth: 1960/11/12  [facility]) CM/SW Contact:  Izola Price, RN Phone Number: 09/10/2021, 12:44 PM   Clinical Narrative:  7/23: Patient is discharging today. Spoke with patient regarding outpatient therapy recommendations and choices. She chose [facility] in [place] as it is close to her home in [place], [place]. Progress note/order sent and signed by provider for PT eval and treat. Patient had no other concerns regarding discharge plan. No transportation issues or issues getting medications. Simmie Davies RN CM        Final next level of care: OP Rehab Barriers to Discharge: Barriers Resolved   Patient Goals and CMS Choice     Choice offered to / list presented to : Patient  Discharge Placement                       Discharge Plan and Services                DME Arranged: N/A DME Agency: NA         HH Agency: NA        Social Determinants of Health (SDOH) Interventions     Readmission Risk Interventions     No data to display

## 2021-09-10 NOTE — Plan of Care (Signed)

## 2021-09-10 NOTE — Discharge Summary (Signed)
Tricia [name] LNL:[id number] DOB: August 06, 1960 DOA: 09/08/2021  PCP: [name], MD  Admit date: 09/08/2021 Discharge date: 09/10/2021  Time spent: 35 minutes  Recommendations for Outpatient Follow-up:  Pcp f/u 1 week     Discharge Diagnoses:  Active Problems:   Chronic diastolic heart failure (HCC)   Type 2 diabetes mellitus ([place])   Essential (primary) hypertension   Mixed hyperlipidemia   GAD (generalized anxiety disorder)   Parkinson's disease ([place])   Sepsis ([place])   CAP (community acquired pneumonia)   Acute respiratory failure with hypoxia ([place])   Lactic acidosis   Leukocytosis   Nausea   Hypokalemia   Hyponatremia   Depression   Hypoalbuminemia due to protein-calorie malnutrition [facility])   Discharge Condition: improved  Diet recommendation: heart healthy  Filed Weights   09/09/21 1403  Weight: 65 kg    History of present illness:  From admission h and p Tricia [name] is a 61 y.o. female with medical history significant of COPD, prediabetes, hyperlipidemia, GERD, migraine headaches, anxiety, depression, benign essential tremor who presents to the emergency department due to shortness of breath and sepsis.  Patient presented to emergency department at [facility] on 09/03/2021 due to shortness of breath and she was diagnosed with pneumonia after CT of chest and labs were reviewed, she was treated with IV antibiotics and was going to be admitted to the hospital, but patient declined, so she was discharged with Z-Pak and prednisone for 4 days.  She continued to have shortness of breath and she presented to her PCPs office today where she was noted to be hypoxic with an O2 sat of 84% on room air, she was tachycardic with HR of 113 and febrile with temperature of 101.29F.  She complained of increased shortness of breath, nausea and change in appetite.  She was asked to go to the ED from PCPs office for further evaluation and management.  Patient also  complained of 1 week onset of burning sensation on urination  Hospital Course:  # CAP secondary to strep pneumonia # Sepsis # Acute hypoxic respiratory failure 2 weeks worsening cough and dyspnea. Diagnosed with CAP 7/16 treated with azithromycin>levofloxacin. Symptoms worsened, febrile at PCP's day of arrival. Here tachycardic, elevated lactate that has normalized with fluids. Strep antigen positive. Sepsis physiology resolved. Sputum and blood cultures no growth to date. Symptomatically much improved, weaned off oxygen, ambulated without difficulty - discussed abx with ID ([place]), plan for 2 more days cefdinir, 2 more days oral prednisone given concurrent copd exacerbation - f/u blood and sputum cultures     Procedures: none   Consultations: nond  Discharge Exam: Vitals:   09/10/21 0411 09/10/21 0819  BP: 113/69 117/60  Pulse: 86 92  Resp: 17 18  Temp: 97.6 F (36.4 C) 97.9 F (36.6 C)  SpO2: 95% 97%    General exam: Appears calm and comfortable  Respiratory system: scattered rhonchi and exp wheeze Cardiovascular system: S1 & S2 heard, RRR. No JVD, murmurs, rubs, gallops or clicks. No pedal edema. Gastrointestinal system: Abdomen is nondistended, soft and nontender. No organomegaly or masses felt. Normal bowel sounds heard. Central nervous system: Alert and oriented. No focal neurological deficits. Extremities: Symmetric 5 x 5 power. Skin: pitting edema LEs Psychiatry: Judgement and insight appear normal. Mood & affect appropriate.   Discharge Instructions   Discharge Instructions     Diet - low sodium heart healthy   Complete by: As directed    Increase activity slowly   Complete by:  As directed       Allergies as of 09/10/2021       Reactions   Augmentin [amoxicillin-pot Clavulanate] Diarrhea   Penicillins Itching        Medication List     STOP taking these medications    azithromycin 250 MG tablet Commonly known as: ZITHROMAX   levofloxacin 500  MG tablet Commonly known as: LEVAQUIN       TAKE these medications    blood glucose meter kit and supplies Kit Dispense accuchek aviva plus; can change if needed e11.9   budesonide-formoterol 160-4.5 MCG/ACT inhaler Commonly known as: SYMBICORT Inhale 2 puffs into the lungs in the morning and at bedtime.   busPIRone 7.5 MG tablet Commonly known as: BUSPAR Take 7.5 mg by mouth 2 (two) times daily.   carbidopa-levodopa 25-100 MG tablet Commonly known as: SINEMET IR Take 1.5 tablets by mouth 3 (three) times daily.   cefdinir 300 MG capsule Commonly known as: OMNICEF Take 1 capsule (300 mg total) by mouth 2 (two) times daily. Start taking on: September 11, 2021   cetirizine 10 MG tablet Commonly known as: ZYRTEC Take 10 mg by mouth daily as needed for allergies.   colchicine 0.6 MG tablet TAKE 1 TABLET BY MOUTH ONCE DAILY   cyclobenzaprine 10 MG tablet Commonly known as: FLEXERIL Take 10 mg by mouth at bedtime.   diphenhydrAMINE 25 MG tablet Commonly known as: BENADRYL Take 25 mg by mouth every 6 (six) hours as needed for allergies.   DULoxetine 30 MG capsule Commonly known as: CYMBALTA TAKE 1 CAPSULE BY MOUTH ONCE DAILY.   furosemide 40 MG tablet Commonly known as: LASIX Take 1 tablet (40 mg total) by mouth daily as needed.   gabapentin 600 MG tablet Commonly known as: NEURONTIN Take 1,200 mg by mouth 3 (three) times daily.   glucose blood test strip Use as directed to check blood glucose daily   hydrOXYzine 25 MG tablet Commonly known as: ATARAX Take 25 mg by mouth 2 (two) times daily.   Insulin Pen Needle 32G X 6 MM Misc 1 each by Does not apply route daily.   ipratropium-albuterol 0.5-2.5 (3) MG/3ML Soln Commonly known as: DuoNeb Inhale 3 mLs into the lungs every 6 (six) hours as needed.   lidocaine 5 % Commonly known as: LIDODERM 1 patch every 12 (twelve) hours as needed.   lubiprostone 24 MCG capsule Commonly known as: AMITIZA Take by mouth 2  (two) times daily as needed.   montelukast 10 MG tablet Commonly known as: SINGULAIR Take 1 tablet by mouth at bedtime.   morphine 15 MG tablet Commonly known as: MSIR Take 15 mg by mouth.   morphine 30 MG 12 hr tablet Commonly known as: MS CONTIN Take 30 mg by mouth every 12 (twelve) hours.   omeprazole 40 MG capsule Commonly known as: PRILOSEC Take 1 capsule (40 mg total) by mouth daily.   Oxycodone HCl 10 MG Tabs Take 10 mg by mouth 4 (four) times daily as needed.   predniSONE 20 MG tablet Commonly known as: DELTASONE Take 2 tablets (40 mg total) by mouth daily. What changed: how much to take   promethazine 25 MG tablet Commonly known as: PHENERGAN Take 25 mg by mouth daily as needed.   rizatriptan 10 MG tablet Commonly known as: MAXALT Take 10 mg by mouth as needed. May repeat in 2 hours if needed   rosuvastatin 5 MG tablet Commonly known as: CRESTOR Take 1 tablet (5 mg total)  by mouth at bedtime.   Synjardy 12.5-500 MG Tabs Generic drug: Empagliflozin-metFORMIN HCl Take 1 tablet by mouth 2 (two) times daily.   theophylline 400 MG 24 hr tablet Commonly known as: UNIPHYL Take 1 tablet by mouth daily.   tiotropium 18 MCG inhalation capsule Commonly known as: SPIRIVA Place 1 capsule into inhaler and inhale daily. pm   traZODone 100 MG tablet Commonly known as: DESYREL Take 1 tablet (100 mg total) by mouth at bedtime.   Ventolin HFA 108 (90 Base) MCG/ACT inhaler Generic drug: albuterol INHALE 1 PUFF BY MOUTH AS NEEDED       Allergies  Allergen Reactions   Augmentin [Amoxicillin-Pot Clavulanate] Diarrhea   Penicillins Itching    Follow-up Information     [name], MD Follow up.   Specialty: Family Medicine Contact information: [address] [place] [address] [phone]                  The results of significant diagnostics from this hospitalization (including imaging, microbiology, ancillary and  laboratory) are listed below for reference.    Significant Diagnostic Studies: DG Chest 2 View  Result Date: 09/08/2021 CLINICAL DATA:  cough EXAM: CHEST - 2 VIEW COMPARISON:  Chest x-ray May 18, 23. FINDINGS: Streaky bibasilar opacities, increased in conspicuity from the prior chest x-ray. No visible pleural effusions or pneumothorax. Cardiomediastinal silhouette is within normal limits. No acute osseous abnormality. IMPRESSION: Streaky bibasilar opacities, increased in conspicuity from the prior chest x-ray and concerning for infectious bronchiolitis given findings on CT chest. Recommend imaging follow-up to resolution. Electronically Signed   By: Margaretha Sheffield M.D.   On: 09/08/2021 16:22   CT CHEST LUNG CA SCREEN LOW DOSE W/O CM  Result Date: 08/28/2021 CLINICAL DATA:  Current smoker, 43 pack-year history. EXAM: CT CHEST WITHOUT CONTRAST LOW-DOSE FOR LUNG CANCER SCREENING TECHNIQUE: Multidetector CT imaging of the chest was performed following the standard protocol without IV contrast. RADIATION DOSE REDUCTION: This exam was performed according to the departmental dose-optimization program which includes automated exposure control, adjustment of the mA and/or kV according to patient size and/or use of iterative reconstruction technique. COMPARISON:  08/25/2020. FINDINGS: Cardiovascular: Atherosclerotic calcification of the aorta and coronary arteries. Heart size normal. No pericardial effusion. Mediastinum/Nodes: 2.1 cm low-attenuation right thyroid nodule. Patient underwent thyroid ultrasound 09/26/2020. No follow-up recommended unless clinically warranted. (Ref: J Am Coll Radiol. 2015 Feb;12(2): 143-50).No pathologically enlarged mediastinal or axillary lymph nodes. Hilar regions are difficult to definitively evaluate without IV contrast. Esophagus is grossly unremarkable. Lungs/Pleura: Centrilobular and paraseptal emphysema. Mild biapical pleuroparenchymal scarring. Patchy areas of amorphous  ground-glass with peribronchovascular ground-glass nodularity and scattered mucoid impaction, new from prior. Findings preclude evaluation for underlying lung nodules. Pre-existing pulmonary nodules measure up to 4.8 mm and are unchanged. No pleural fluid. Airway is otherwise unremarkable. Upper Abdomen: Liver is markedly decreased in attenuation. Visualized portions of the liver and adrenal glands are otherwise unremarkable. There may be partially imaged renal sinus cysts bilaterally. No specific follow-up necessary. Visualized portions of the spleen, pancreas, stomach and bowel are grossly unremarkable. Musculoskeletal: Degenerative changes in the spine. No worrisome lytic or sclerotic lesions. IMPRESSION: 1. Lung-RADS 0, incomplete. Additional lung cancer screening CT images/or comparison to prior chest CT examinations is needed. Extensive mid and lower lung zone predominant peribronchovascular ground-glass, nodularity and endobronchial debris, new from prior and most indicative of an infectious bronchiolitis. Recommend repeat low-dose lung cancer screening CT in 4-6 weeks, after appropriate therapy. These results will  be called to the ordering clinician or representative by the Radiologist Assistant, and communication documented in the PACS or [employer]. 2. Marked hepatic steatosis. 3. Aortic atherosclerosis (ICD10-I70.0). Coronary artery calcification. 4.  Emphysema (ICD10-J43.9). Electronically Signed   By: Lorin Picket M.D.   On: 08/28/2021 13:33   Microbiology: Recent Results (from the past 240 hour(s))  Resp Panel by RT-PCR (Flu A&B, Covid) Anterior Nasal Swab     Status: None   Collection Time: 09/08/21  4:47 PM   Specimen: Anterior Nasal Swab  Result Value Ref Range Status   SARS Coronavirus 2 by RT PCR NEGATIVE NEGATIVE Final    Comment: (NOTE) SARS-CoV-2 target nucleic acids are NOT DETECTED.  The SARS-CoV-2 RNA is generally detectable in upper respiratory specimens during the  acute phase of infection. The lowest concentration of SARS-CoV-2 viral copies this assay can detect is 138 copies/mL. A negative result does not preclude SARS-Cov-2 infection and should not be used as the sole basis for treatment or other patient management decisions. A negative result may occur with  improper specimen collection/handling, submission of specimen other than nasopharyngeal swab, presence of viral mutation(s) within the areas targeted by this assay, and inadequate number of viral copies(<138 copies/mL). A negative result must be combined with clinical observations, patient history, and epidemiological information. The expected result is Negative.  Fact Sheet for Patients:  EntrepreneurPulse.com.au  Fact Sheet for Healthcare Providers:  IncredibleEmployment.be  This test is no t yet approved or cleared by the [place] FDA and  has been authorized for detection and/or diagnosis of SARS-CoV-2 by FDA under an Emergency Use Authorization (EUA). This EUA will remain  in effect (meaning this test can be used) for the duration of the COVID-19 declaration under Section 564(b)(1) of the Act, 21 U.S.C.section 360bbb-3(b)(1), unless the authorization is terminated  or revoked sooner.       Influenza A by PCR NEGATIVE NEGATIVE Final   Influenza B by PCR NEGATIVE NEGATIVE Final    Comment: (NOTE) The Xpert Xpress SARS-CoV-2/FLU/RSV plus assay is intended as an aid in the diagnosis of influenza from Nasopharyngeal swab specimens and should not be used as a sole basis for treatment. Nasal washings and aspirates are unacceptable for Xpert Xpress SARS-CoV-2/FLU/RSV testing.  Fact Sheet for Patients: EntrepreneurPulse.com.au  Fact Sheet for Healthcare Providers: IncredibleEmployment.be  This test is not yet approved or cleared by the [place] FDA and has been authorized for detection and/or  diagnosis of SARS-CoV-2 by FDA under an Emergency Use Authorization (EUA). This EUA will remain in effect (meaning this test can be used) for the duration of the COVID-19 declaration under Section 564(b)(1) of the Act, 21 U.S.C. section 360bbb-3(b)(1), unless the authorization is terminated or revoked.  Performed at [facility], [address] [place], [place] [address]   Expectorated Sputum Assessment w Gram Stain, Rflx to Resp Cult     Status: None   Collection Time: 09/09/21 12:35 AM   Specimen: Expectorated Sputum  Result Value Ref Range Status   Specimen Description EXPECTORATED SPUTUM  Final   Special Requests NONE  Final   Sputum evaluation   Final    THIS SPECIMEN IS ACCEPTABLE FOR SPUTUM CULTURE Performed at [facility], [address], [place], [place] [address]    Report Status 09/09/2021 FINAL  Final  Culture, blood (Routine X 2) w Reflex to ID Panel     Status: None (Preliminary result)   Collection Time: 09/09/21 12:35 AM   Specimen: BLOOD  Result Value Ref Range Status   Specimen Description BLOOD RIGHT ANTECUBITAL  Final   Special Requests   Final    BOTTLES DRAWN AEROBIC AND ANAEROBIC Blood Culture adequate volume   Culture   Final    NO GROWTH 1 DAY Performed at [facility], [address] [place], [place] [address]    Report Status PENDING  Incomplete  Culture, blood (Routine X 2) w Reflex to ID Panel     Status: None (Preliminary result)   Collection Time: 09/09/21 12:35 AM   Specimen: BLOOD  Result Value Ref Range Status   Specimen Description BLOOD BLOOD RIGHT HAND  Final   Special Requests   Final    BOTTLES DRAWN AEROBIC AND ANAEROBIC Blood Culture results may not be optimal due to an inadequate volume of blood received in culture bottles   Culture   Final    NO GROWTH 1 DAY Performed at [facility], [address], [place], [place] [address]    Report Status PENDING  Incomplete  Culture, Respiratory w  Gram Stain     Status: None (Preliminary result)   Collection Time: 09/09/21 12:35 AM  Result Value Ref Range Status   Specimen Description   Final    EXPECTORATED SPUTUM Performed at [facility], [address], [place], [place] [address]    Special Requests   Final    NONE Reflexed from [id number] Performed at [facility], [address] [place], [place] [address]    Gram Stain   Final    FEW SQUAMOUS EPITHELIAL CELLS PRESENT RARE WBC PRESENT,BOTH PMN AND MONONUCLEAR FEW YEAST FEW GRAM POSITIVE COCCI IN CLUSTERS    Culture   Final    CULTURE REINCUBATED FOR BETTER GROWTH Performed at [facility], [place] [address] [place], [place] [address]    Report Status PENDING  Incomplete     Labs: Basic Metabolic Panel: Recent Labs  Lab 09/08/21 1647 09/09/21 0607 09/10/21 0544  NA 132* 135 134*  K 2.8* 3.4* 3.8  CL 89* 94* 96*  CO2 37* 34* 31  GLUCOSE 113* 102* 249*  BUN 10 10 13   CREATININE 0.91 0.93 0.73  CALCIUM 7.7* 7.6* 7.9*  MG  --  1.7  --    Liver Function Tests: Recent Labs  Lab 09/08/21 1647 09/09/21 0607 09/10/21 0544  AST 59* 52* 43*  ALT 36 48* 11  ALKPHOS 112 107 104  BILITOT 0.8 0.7 0.6  PROT 6.2* 5.5* 4.9*  ALBUMIN 2.7* 2.3* 2.1*   Recent Labs  Lab 09/08/21 1647  LIPASE 19   No results for input(s): "AMMONIA" in the last 168 hours. CBC: Recent Labs  Lab 09/08/21 1647 09/10/21 0544  WBC 19.0* 8.4  NEUTROABS 15.5*  --   HGB 12.9 10.8*  HCT 39.2 32.0*  MCV 93.6 93.0  PLT 347 217   Cardiac Enzymes: No results for input(s): "CKTOTAL", "CKMB", "CKMBINDEX", "TROPONINI" in the last 168 hours. BNP: BNP (last 3 results) Recent Labs    07/06/21 1144 09/01/21 1426  BNP 30.9 50.5    ProBNP (last 3 results) No results for input(s): "PROBNP" in the last 8760 hours.  CBG: Recent Labs  Lab 09/09/21 1206 09/09/21 1646 09/09/21 2132 09/10/21 0718 09/10/21 1142  GLUCAP 165* 264* 239* 217* 238*        Signed:  Desma Maxim MD.  [facility] 09/10/2021, 12:18 PM

## 2021-09-12 ENCOUNTER — Other Ambulatory Visit: Payer: Self-pay | Admitting: Family Medicine

## 2021-09-12 LAB — LEGIONELLA PNEUMOPHILA SEROGP 1 UR AG: L. pneumophila Serogp 1 Ur Ag: NEGATIVE

## 2021-09-12 LAB — CULTURE, RESPIRATORY W GRAM STAIN

## 2021-09-12 MED ORDER — SULFAMETHOXAZOLE-TRIMETHOPRIM 800-160 MG PO TABS: 1.0000 | ORAL_TABLET | Freq: Two times a day (BID) | ORAL | 0 refills | Status: DC

## 2021-09-14 ENCOUNTER — Ambulatory Visit (INDEPENDENT_AMBULATORY_CARE_PROVIDER_SITE_OTHER): Payer: Medicare Other | Admitting: Nurse Practitioner

## 2021-09-14 ENCOUNTER — Encounter: Payer: Self-pay | Admitting: Nurse Practitioner

## 2021-09-14 ENCOUNTER — Other Ambulatory Visit: Payer: Self-pay

## 2021-09-14 VITALS — BP 126/74 | HR 100 | Temp 98.4°F | Resp 18 | Ht 68.5 in | Wt 143.5 lb

## 2021-09-14 DIAGNOSIS — J9601 Acute respiratory failure with hypoxia: Secondary | ICD-10-CM

## 2021-09-14 DIAGNOSIS — A419 Sepsis, unspecified organism: Secondary | ICD-10-CM | POA: Diagnosis not present

## 2021-09-14 DIAGNOSIS — J189 Pneumonia, unspecified organism: Secondary | ICD-10-CM | POA: Diagnosis not present

## 2021-09-14 DIAGNOSIS — R296 Repeated falls: Secondary | ICD-10-CM

## 2021-09-14 DIAGNOSIS — J432 Centrilobular emphysema: Secondary | ICD-10-CM

## 2021-09-14 LAB — CULTURE, BLOOD (ROUTINE X 2)
Culture: NO GROWTH
Culture: NO GROWTH
Special Requests: ADEQUATE

## 2021-09-14 MED ORDER — BUDESONIDE-FORMOTEROL FUMARATE 160-4.5 MCG/ACT IN AERO: 2.0000 | INHALATION_SPRAY | Freq: Two times a day (BID) | RESPIRATORY_TRACT | 5 refills | Status: DC

## 2021-09-14 MED ORDER — SULFAMETHOXAZOLE-TRIMETHOPRIM 800-160 MG PO TABS: 1.0000 | ORAL_TABLET | Freq: Two times a day (BID) | ORAL | 0 refills | Status: AC

## 2021-09-14 NOTE — Assessment & Plan Note (Addendum)
Patient has improved.  Vital signs are stable.  Patient still complaining of some shortness of breath and cough.  Will extend Septra and refill her Symbicort.  Also getting lab work. Patient is also to contact Dr. Gust Brooms office (pulmonology) for an appointment.

## 2021-09-14 NOTE — Assessment & Plan Note (Signed)
Patient is also to contact Dr. Gust Brooms office (pulmonology) for an appointment.  Refill of Symbicort sent to pharmacy.

## 2021-09-14 NOTE — Assessment & Plan Note (Signed)
Patient has improved vital signs are stable.  We will be getting lab work today.

## 2021-09-14 NOTE — Assessment & Plan Note (Addendum)
Patient has improved.  Vital signs are stable.  Patient still complaining of some shortness of breath and cough.  Will extend Septra and refill her Symbicort.  Also getting lab work.  Patient is also to contact Dr. Gust Brooms office (pulmonology) for an appointment.

## 2021-09-14 NOTE — Progress Notes (Signed)
BP 126/74   Pulse 100   Temp 98.4 F (36.9 C) (Oral)   Resp 18   Ht 5' 8.5" (1.74 m)   Wt 143 lb 8 oz (65.1 kg)   SpO2 94%   BMI 21.50 kg/m    Subjective:    Patient ID: Tricia [name], female    DOB: 03-26-1960, 61 y.o.   MRN: [id number]  HPI: Tricia [name] is a 61 y.o. female  Chief Complaint  Patient presents with   Hospitalization Follow-up    pneumonia   Hospital follow up/ pneumonia/sepsis/respiratory failure/multiple falls/COPD: She was seen by Dr. [name] on 09/08/2021.  She arrived at that appointment Dr. [name] sent her to the emergency department.  Previously she was seen at [facility] emergency room on 09/03/2021 for shortness of breath, she had a CT chest and labs done, she was diagnosed with pneumonia and given IV antibiotics advised to be admitted but she declined.  She went home with a Z-Pak and prednisone for 4 days.  She saw Dr. [name] on 09/08/2021 she was more hypoxic her pulse ox was down to 84% and her heart rate had increased to 113, temperature was 101.4.  Patient was sent to the emergency room at [facility] sepsis work-up. Patient was admitted for sepsis, pneumonia and respiratory failure.  Her white count on 09/08/2021 19.0, strep pneumonia antigen was positive, lactic acid was 4.7, her sodium was 134. She was discharged on 09/10/2021.  Today her oxygen saturations up to 94% on room air.  Patient's respirations are 18 she has a normal temperature. She has completed taking cefdinir, septra and prednisone which was she was prescribed upon discharge.  Patient reports she is still feeling sick. She says she is still coughing up phlegm.  She says she feels weak and has been having multiple falls.  Patient denies any injury.  States she is just tired of falling.  Discussed physical therapy and patient says the hospital was going to place a referral but she has not heard anything. Will send another referral. Patient pulmonologist is Dr. Raul Del.  Patient states  she thinks she has an appointment the next couple months.  Discussed with patient that she needs to call and schedule a sooner appointment.  Patient states she is also out of her Symbicort.  We will send in refill.  Patient is in agreement with plan.     Relevant past medical, surgical, family and social history reviewed and updated as indicated. Interim medical history since our last visit reviewed. Allergies and medications reviewed and updated.  Review of Systems  Constitutional: Negative for fever or weight change.  Respiratory: positive for cough and shortness of breath.   Cardiovascular: Negative for chest pain or palpitations.  Gastrointestinal: Negative for abdominal pain, no bowel changes.  Musculoskeletal: Negative for gait problem or joint swelling.  Skin: Negative for rash.  Neurological: Negative for dizziness or headache.  No other specific complaints in a complete review of systems (except as listed in HPI above).      Objective:    BP 126/74   Pulse 100   Temp 98.4 F (36.9 C) (Oral)   Resp 18   Ht 5' 8.5" (1.74 m)   Wt 143 lb 8 oz (65.1 kg)   SpO2 94%   BMI 21.50 kg/m   Wt Readings from Last 3 Encounters:  09/14/21 143 lb 8 oz (65.1 kg)  09/09/21 143 lb 4.8 oz (65 kg)  09/08/21 137 lb 12.8 oz (62.5 kg)  Physical Exam  Constitutional: Patient appears well-developed and well-nourished. No distress.  HEENT: head atraumatic, normocephalic, pupils equal and reactive to light,  neck supple Cardiovascular: Normal rate, regular rhythm and normal heart sounds.  No murmur heard. No BLE edema. Pulmonary/Chest: Effort normal and breath sounds wheezing and rhonchi. No respiratory distress. Abdominal: Soft.  There is no tenderness. Psychiatric: Patient has a normal mood and affect. behavior is normal. Judgment and thought content normal.  Results for orders placed or performed during the hospital encounter of 09/08/21  Resp Panel by RT-PCR (Flu A&B, Covid) Anterior  Nasal Swab   Specimen: Anterior Nasal Swab  Result Value Ref Range   SARS Coronavirus 2 by RT PCR NEGATIVE NEGATIVE   Influenza A by PCR NEGATIVE NEGATIVE   Influenza B by PCR NEGATIVE NEGATIVE  Expectorated Sputum Assessment w Gram Stain, Rflx to Resp Cult   Specimen: Expectorated Sputum  Result Value Ref Range   Specimen Description EXPECTORATED SPUTUM    Special Requests NONE    Sputum evaluation      THIS SPECIMEN IS ACCEPTABLE FOR SPUTUM CULTURE Performed at [facility], [address] [place], [place] [address]    Report Status 09/09/2021 FINAL   Culture, blood (Routine X 2) w Reflex to ID Panel   Specimen: BLOOD  Result Value Ref Range   Specimen Description BLOOD RIGHT ANTECUBITAL    Special Requests      BOTTLES DRAWN AEROBIC AND ANAEROBIC Blood Culture adequate volume   Culture      NO GROWTH 5 DAYS Performed at [facility], [address] [place], [place] [address]    Report Status 09/14/2021 FINAL   Culture, blood (Routine X 2) w Reflex to ID Panel   Specimen: BLOOD  Result Value Ref Range   Specimen Description BLOOD BLOOD RIGHT HAND    Special Requests      BOTTLES DRAWN AEROBIC AND ANAEROBIC Blood Culture results may not be optimal due to an inadequate volume of blood received in culture bottles   Culture      NO GROWTH 5 DAYS Performed at [facility], [address], [place], [place] [address]    Report Status 09/14/2021 FINAL   Culture, Respiratory w Gram Stain  Result Value Ref Range   Specimen Description      EXPECTORATED SPUTUM Performed at [facility], [address] [place], [place] [address]    Special Requests      NONE Reflexed from [id number] Performed at [place], [place] [address]    Gram Stain      FEW SQUAMOUS EPITHELIAL CELLS PRESENT RARE WBC PRESENT,BOTH PMN AND MONONUCLEAR FEW YEAST FEW GRAM POSITIVE COCCI IN CLUSTERS Performed at [place], [place] [address], [place], [place] [address]    Culture      ABUNDANT METHICILLIN RESISTANT STAPHYLOCOCCUS AUREUS   Report Status 09/12/2021 FINAL    Organism ID, Bacteria METHICILLIN RESISTANT STAPHYLOCOCCUS AUREUS       Susceptibility   Methicillin resistant staphylococcus aureus - MIC*    CIPROFLOXACIN >=8 RESISTANT Resistant     ERYTHROMYCIN >=8 RESISTANT Resistant     GENTAMICIN <=0.5 SENSITIVE Sensitive     OXACILLIN >=4 RESISTANT Resistant     TETRACYCLINE <=1 SENSITIVE Sensitive     VANCOMYCIN 2 SENSITIVE Sensitive     TRIMETH/SULFA <=10 SENSITIVE Sensitive     CLINDAMYCIN >=8 RESISTANT Resistant     RIFAMPIN <=0.5 SENSITIVE Sensitive  Inducible Clindamycin NEGATIVE Sensitive     * ABUNDANT METHICILLIN RESISTANT STAPHYLOCOCCUS AUREUS  Lactic acid, plasma  Result Value Ref Range   Lactic Acid, Venous 2.0 (HH) 0.5 - 1.9 mmol/L  Procalcitonin - Baseline  Result Value Ref Range   Procalcitonin 0.35 ng/mL  CBC with Differential  Result Value Ref Range   WBC 19.0 (H) 4.0 - 10.5 K/uL   RBC 4.19 3.87 - 5.11 MIL/uL   Hemoglobin 12.9 12.0 - 15.0 g/dL   HCT 39.2 36.0 - 46.0 %   MCV 93.6 80.0 - 100.0 fL   MCH 30.8 26.0 - 34.0 pg   MCHC 32.9 30.0 - 36.0 g/dL   RDW 14.0 11.5 - 15.5 %   Platelets 347 150 - 400 K/uL   nRBC 0.0 0.0 - 0.2 %   Neutrophils Relative % 81 %   Neutro Abs 15.5 (H) 1.7 - 7.7 K/uL   Lymphocytes Relative 14 %   Lymphs Abs 2.6 0.7 - 4.0 K/uL   Monocytes Relative 3 %   Monocytes Absolute 0.6 0.1 - 1.0 K/uL   Eosinophils Relative 0 %   Eosinophils Absolute 0.0 0.0 - 0.5 K/uL   Basophils Relative 1 %   Basophils Absolute 0.1 0.0 - 0.1 K/uL   Immature Granulocytes 1 %   Abs Immature Granulocytes 0.17 (H) 0.00 - 0.07 K/uL  Comprehensive metabolic panel  Result Value Ref Range   Sodium 132 (L) 135 - 145 mmol/L   Potassium 2.8 (L) 3.5 - 5.1 mmol/L   Chloride 89 (L) 98 - 111 mmol/L   CO2 37 (H) 22 - 32 mmol/L   Glucose, Bld 113 (H) 70 - 99 mg/dL   BUN 10  6 - 20 mg/dL   Creatinine, Ser 0.91 0.44 - 1.00 mg/dL   Calcium 7.7 (L) 8.9 - 10.3 mg/dL   Total Protein 6.2 (L) 6.5 - 8.1 g/dL   Albumin 2.7 (L) 3.5 - 5.0 g/dL   AST 59 (H) 15 - 41 U/L   ALT 36 0 - 44 U/L   Alkaline Phosphatase 112 38 - 126 U/L   Total Bilirubin 0.8 0.3 - 1.2 mg/dL   GFR, Estimated >60 >60 mL/min   Anion gap 6 5 - 15  Lipase, blood  Result Value Ref Range   Lipase 19 11 - 51 U/L  Urinalysis, Routine w reflex microscopic Urine, Clean Catch  Result Value Ref Range   Color, Urine YELLOW (A) YELLOW   APPearance CLOUDY (A) CLEAR   Specific Gravity, Urine 1.006 1.005 - 1.030   pH 6.0 5.0 - 8.0   Glucose, UA 50 (A) NEGATIVE mg/dL   Hgb urine dipstick MODERATE (A) NEGATIVE   Bilirubin Urine NEGATIVE NEGATIVE   Ketones, ur NEGATIVE NEGATIVE mg/dL   Protein, ur NEGATIVE NEGATIVE mg/dL   Nitrite NEGATIVE NEGATIVE   Leukocytes,Ua LARGE (A) NEGATIVE   RBC / HPF 11-20 0 - 5 RBC/hpf   WBC, UA >50 (H) 0 - 5 WBC/hpf   Bacteria, UA RARE (A) NONE SEEN   Squamous Epithelial / LPF 0-5 0 - 5   WBC Clumps PRESENT    Budding Yeast PRESENT   Blood gas, venous  Result Value Ref Range   pH, Ven 7.45 (H) 7.25 - 7.43   pCO2, Ven 55 44 - 60 mmHg   pO2, Ven 31 (LL) 32 - 45 mmHg   Bicarbonate 38.2 (H) 20.0 - 28.0 mmol/L   Acid-Base Excess 11.9 (H) 0.0 - 2.0 mmol/L   O2 Saturation 51.7 %  Patient temperature 37.0   Procalcitonin  Result Value Ref Range   Procalcitonin 0.41 ng/mL  Lactic acid, plasma  Result Value Ref Range   Lactic Acid, Venous 4.7 (HH) 0.5 - 1.9 mmol/L  Strep pneumoniae urinary antigen  Result Value Ref Range   Strep Pneumo Urinary Antigen POSITIVE (A) NEGATIVE  Legionella Pneumophila Serogp 1 Ur Ag  Result Value Ref Range   L. pneumophila Serogp 1 Ur Ag Negative Negative   Source of Sample URINE, RANDOM   Lactic acid, plasma  Result Value Ref Range   Lactic Acid, Venous 1.3 0.5 - 1.9 mmol/L  HIV Antibody (routine testing w rflx)  Result Value Ref Range    HIV Screen 4th Generation wRfx Non Reactive Non Reactive  Comprehensive metabolic panel  Result Value Ref Range   Sodium 135 135 - 145 mmol/L   Potassium 3.4 (L) 3.5 - 5.1 mmol/L   Chloride 94 (L) 98 - 111 mmol/L   CO2 34 (H) 22 - 32 mmol/L   Glucose, Bld 102 (H) 70 - 99 mg/dL   BUN 10 6 - 20 mg/dL   Creatinine, Ser 0.93 0.44 - 1.00 mg/dL   Calcium 7.6 (L) 8.9 - 10.3 mg/dL   Total Protein 5.5 (L) 6.5 - 8.1 g/dL   Albumin 2.3 (L) 3.5 - 5.0 g/dL   AST 52 (H) 15 - 41 U/L   ALT 48 (H) 0 - 44 U/L   Alkaline Phosphatase 107 38 - 126 U/L   Total Bilirubin 0.7 0.3 - 1.2 mg/dL   GFR, Estimated >60 >60 mL/min   Anion gap 7 5 - 15  APTT  Result Value Ref Range   aPTT 27 24 - 36 seconds  Magnesium  Result Value Ref Range   Magnesium 1.7 1.7 - 2.4 mg/dL  Glucose, capillary  Result Value Ref Range   Glucose-Capillary 165 (H) 70 - 99 mg/dL  Glucose, capillary  Result Value Ref Range   Glucose-Capillary 264 (H) 70 - 99 mg/dL  CBC  Result Value Ref Range   WBC 8.4 4.0 - 10.5 K/uL   RBC 3.44 (L) 3.87 - 5.11 MIL/uL   Hemoglobin 10.8 (L) 12.0 - 15.0 g/dL   HCT 32.0 (L) 36.0 - 46.0 %   MCV 93.0 80.0 - 100.0 fL   MCH 31.4 26.0 - 34.0 pg   MCHC 33.8 30.0 - 36.0 g/dL   RDW 13.7 11.5 - 15.5 %   Platelets 217 150 - 400 K/uL   nRBC 0.0 0.0 - 0.2 %  Comprehensive metabolic panel  Result Value Ref Range   Sodium 134 (L) 135 - 145 mmol/L   Potassium 3.8 3.5 - 5.1 mmol/L   Chloride 96 (L) 98 - 111 mmol/L   CO2 31 22 - 32 mmol/L   Glucose, Bld 249 (H) 70 - 99 mg/dL   BUN 13 6 - 20 mg/dL   Creatinine, Ser 0.73 0.44 - 1.00 mg/dL   Calcium 7.9 (L) 8.9 - 10.3 mg/dL   Total Protein 4.9 (L) 6.5 - 8.1 g/dL   Albumin 2.1 (L) 3.5 - 5.0 g/dL   AST 43 (H) 15 - 41 U/L   ALT 11 0 - 44 U/L   Alkaline Phosphatase 104 38 - 126 U/L   Total Bilirubin 0.6 0.3 - 1.2 mg/dL   GFR, Estimated >60 >60 mL/min   Anion gap 7 5 - 15  Glucose, capillary  Result Value Ref Range   Glucose-Capillary 239 (H) 70 - 99  mg/dL  Glucose,  capillary  Result Value Ref Range   Glucose-Capillary 217 (H) 70 - 99 mg/dL  Glucose, capillary  Result Value Ref Range   Glucose-Capillary 238 (H) 70 - 99 mg/dL  CBG monitoring, ED  Result Value Ref Range   Glucose-Capillary 116 (H) 70 - 99 mg/dL  CBG monitoring, ED  Result Value Ref Range   Glucose-Capillary 97 70 - 99 mg/dL      Assessment & Plan:   Problem List Items Addressed This Visit       Respiratory   Centrilobular emphysema (HCC)     Patient is also to contact Dr. Gust Brooms office (pulmonology) for an appointment.  Refill of Symbicort sent to pharmacy.      Relevant Medications   budesonide-formoterol (SYMBICORT) 160-4.5 MCG/ACT inhaler   CAP (community acquired pneumonia) - Primary    Patient has improved.  Vital signs are stable.  Patient still complaining of some shortness of breath and cough.  Will extend Septra and refill her Symbicort.  Also getting lab work. Patient is also to contact Dr. Gust Brooms office (pulmonology) for an appointment.      Relevant Medications   budesonide-formoterol (SYMBICORT) 160-4.5 MCG/ACT inhaler   sulfamethoxazole-trimethoprim (BACTRIM DS) 800-160 MG tablet   Other Relevant Orders   CBC with Differential/Platelet   COMPLETE METABOLIC PANEL WITH GFR   Ambulatory referral to Physical Therapy   Acute respiratory failure with hypoxia [facility])    Patient has improved.  Vital signs are stable.  Patient still complaining of some shortness of breath and cough.  Will extend Septra and refill her Symbicort.  Also getting lab work.  Patient is also to contact Dr. Gust Brooms office (pulmonology) for an appointment.      Relevant Orders   CBC with Differential/Platelet   COMPLETE METABOLIC PANEL WITH GFR   Ambulatory referral to Physical Therapy     Other   Sepsis [facility])    Patient has improved vital signs are stable.  We will be getting lab work today.      Relevant Orders   CBC with Differential/Platelet   COMPLETE  METABOLIC PANEL WITH GFR   Ambulatory referral to Physical Therapy   Other Visit Diagnoses     Falls frequently       No injuries.  We will refer to physical therapy.   Relevant Orders   Ambulatory referral to Physical Therapy        Follow up plan: Return for Has appointment with Dr. [name] in September.Marland Kitchen

## 2021-09-15 LAB — COMPLETE METABOLIC PANEL WITH GFR
AG Ratio: 1.3 (calc) (ref 1.0–2.5)
ALT: 47 U/L — ABNORMAL HIGH (ref 6–29)
AST: 119 U/L — ABNORMAL HIGH (ref 10–35)
Albumin: 3 g/dL — ABNORMAL LOW (ref 3.6–5.1)
Alkaline phosphatase (APISO): 111 U/L (ref 37–153)
BUN: 13 mg/dL (ref 7–25)
CO2: 30 mmol/L (ref 20–32)
Calcium: 8 mg/dL — ABNORMAL LOW (ref 8.6–10.4)
Chloride: 102 mmol/L (ref 98–110)
Creat: 0.65 mg/dL (ref 0.50–1.05)
Globulin: 2.4 g/dL (calc) (ref 1.9–3.7)
Glucose, Bld: 245 mg/dL — ABNORMAL HIGH (ref 65–99)
Potassium: 3.8 mmol/L (ref 3.5–5.3)
Sodium: 139 mmol/L (ref 135–146)
Total Bilirubin: 0.4 mg/dL (ref 0.2–1.2)
Total Protein: 5.4 g/dL — ABNORMAL LOW (ref 6.1–8.1)
eGFR: 101 mL/min/{1.73_m2} (ref >=60)

## 2021-09-15 LAB — CBC WITH DIFFERENTIAL/PLATELET
Absolute Monocytes: 360 cells/uL (ref 200–950)
Basophils Absolute: 43 cells/uL (ref 0–200)
Basophils Relative: 0.6 %
Eosinophils Absolute: 0 cells/uL — ABNORMAL LOW (ref 15–500)
Eosinophils Relative: 0 %
HCT: 35.2 % (ref 35.0–45.0)
Hemoglobin: 11.6 g/dL — ABNORMAL LOW (ref 11.7–15.5)
Lymphs Abs: 958 cells/uL (ref 850–3900)
MCH: 32.4 pg (ref 27.0–33.0)
MCHC: 33 g/dL (ref 32.0–36.0)
MCV: 98.3 fL (ref 80.0–100.0)
MPV: 9.3 fL (ref 7.5–12.5)
Monocytes Relative: 5 %
Neutro Abs: 5839 cells/uL (ref 1500–7800)
Neutrophils Relative %: 81.1 %
Platelets: 301 10*3/uL (ref 140–400)
RBC: 3.58 10*6/uL — ABNORMAL LOW (ref 3.80–5.10)
RDW: 13.1 % (ref 11.0–15.0)
Total Lymphocyte: 13.3 %
WBC: 7.2 10*3/uL (ref 3.8–10.8)

## 2021-09-19 ENCOUNTER — Other Ambulatory Visit: Payer: Self-pay | Admitting: Family Medicine

## 2021-09-19 MED ORDER — QUETIAPINE FUMARATE 25 MG PO TABS: 25.0000 mg | ORAL_TABLET | Freq: Every day | ORAL | 0 refills | Status: DC

## 2021-10-02 NOTE — Chronic Care Management (AMB) (Signed)
  Chronic Care Management   Outreach Note  10/02/2021 Name: ARTISHA CAPRI MRN: [id number] DOB: 1960-11-03  ARYAA BUNTING is a 61 y.o. year old female who is a primary care patient of [name], MD. I reached out to [name] by phone today in response to a referral sent by Ms. Lillette Boxer Mata's primary care provider.  Third unsuccessful telephone outreach was attempted today. The patient was referred to the case management team for assistance with care management and care coordination. [name] primary care provider has been notified of our unsuccessful attempts to make or maintain contact with the patient. The care management team is pleased to engage with this patient at any time in the future should he/she be interested in assistance from the care management team.   Follow Up Plan: We have been unable to make contact with the patient for follow up. The care management team is available to follow up with the patient after provider conversation with the patient regarding recommendation for care management engagement and subsequent re-referral to the care management team.   Julian Hy, [place] Direct Dial: [phone]

## 2021-10-12 ENCOUNTER — Ambulatory Visit: Admission: RE | Admit: 2021-10-12 | Payer: Medicare Other | Source: Ambulatory Visit

## 2021-10-24 NOTE — Progress Notes (Unsigned)
Name: Tricia [name]   MRN: [id number]    DOB: 1960-09-05   Date:10/25/2021       Progress Note  Subjective  Chief Complaint  Follow Up  HPI  Protein malnutrition: weight loss, without dieting She went to see GI and had EGD 11/22 , but she refused colonoscopy. She has not scheduled her mammogram yet. She continues to feel tired. She had CT abdomen that showed fatty liver , admitted for CAP with sepsis in July .Marland Kitchen She lost to follow up with Endo for thyroid nodules. She had abnormal CT chest done during hospital stay and needs to follow up with pulmonologist   Parkinson's: she was diagnosed in 2021 by Dr. Trena Platt PA  with right hemibody parkinson's . She is taking Sinemet IR. She states still has balanced problems and tremors of both hands She has been falling more often. Discussed PT - she is not home bound.    Diabetes type II ,currently Synjardi   A1C has been at goal last A1C was  6.1 %  She has dyslipidemia, and microalbuminuria.  She denies polyphagia, polydipsia or polyuria.  She has associated dyslipidemia  and CKI , albuminuria on SGL-2 agonist .BP is low now and off ACE/ARB also off lantus due to weight loss and glucose at goal    Chronic pain: sees pain clinic, currently going to [facility], she states pain is still up and down, worse pain is on lumbar spine. Unchanged  Chronic nausea/gastritis/barrett's she has nausea, due for follow up with GI, advised to ask them for refill of promethazine, she states Zofran does not work well for her , she states appetite is a little better  Gout: she has flares intermittently,takes colchicine prn    Asthma and COPD and chronic airflow limitation:  under the care of Dr. Raul Del. She had CT repeat CT 08/2021 , found to have ground glass changes and will follow up with him soon, she is due for repeat CT chest but will wait for him to order it.    08/2021  1. Lung-RADS 0, incomplete. Additional lung cancer screening CT images/or  comparison to prior chest CT examinations is needed.   Extensive mid and lower lung zone predominant peribronchovascular ground-glass, nodularity and endobronchial debris, new from prior and most indicative of an infectious bronchiolitis. Recommend repeat low-dose lung cancer screening CT in 4-6 weeks, after appropriate therapy.   These results will be called to the ordering clinician or representative by the Radiologist Assistant, and communication documented in the PACS or [employer]. 2. Marked hepatic steatosis. 3. Aortic atherosclerosis (ICD10-I70.0). Coronary artery calcification. 4.  Emphysema (ICD10-J43.9).  Thyroid nodule: had US done by Dr. Manfred Shirts at [facility] clinic 06/2019, she states they decided not to do a biopsy , repeat US in 22 did not recommend repeat biopsy , small lymphonodo on left side, seems reactive, but with weight loss, may need a biopsy  - discussed with patient, we will send her back for follow up   Major Depression: chronic and recurrent, phq 9 is high, she was under the care of Dr. [name] but lost to follow up . She recently lost her boyfriend of 20 plus years, he left her a lot of debt and not money to pay for it. She moved in with her daughter and is trying to sell her house. She is sad that her indoor dogs have to stay outside since she moved in with her daughter. She states takes buspar very seldom, advised  to stop benadryl and also to stop hydroxizine due to recurrent falls    CHF: doing well at this time,  she has orthopnea - she uses two pillows . Dr. Ubaldo Glassing retired, but needs to follow up with [facility] cardiology, she is on lasix  and SGL2 agonist since 08/22. Tolerating it well. She is not on ARB or ACE due to low bp. She is on statin therapy and tolerating it well, we will recheck lipid panel   Echo from 06/05/2019  NORMAL LEFT VENTRICULAR SYSTOLIC FUNCTION  NORMAL RIGHT VENTRICULAR SYSTOLIC FUNCTION  MILD VALVULAR REGURGITATION (See above)  NO  VALVULAR STENOSIS  Closest EF: >55% (Estimated)  Aortic: MILD AR  Mitral: MILD MR  Tricuspid: MILD TR  Echo showed enlarged left and right atrium   Atherosclerosis of Aorta: discussed CT , she is now taking statin therapy and last LDL is down 37 but she is due for repeat labs   Senile purpura: stable and reassurance given She has been using bandaid's explained she needs to use arm sleeve's , no bandaid  Rhinorrhea: using afrin daily, discussed stopping it and start atrovent instead, she states always had allergies and takes benadryl every night for nasal congestion.   Patient Active Problem List   Diagnosis Date Noted   Acute respiratory failure with hypoxia ([place]) 09/08/2021   Nausea 09/08/2021   Hypoalbuminemia due to protein-calorie malnutrition ([place]) 09/08/2021   Lumbar herniated disc 07/11/2021   Calculus of gallbladder without cholecystitis without obstruction 07/11/2021   Senile purpura ([place]) 06/05/2021   Atherosclerosis of aorta ([place]) 06/05/2021   Parkinson's disease ([place]) 06/05/2021   Chronic kidney disease (CKD) stage G3a/A2, moderately decreased glomerular filtration rate (GFR) between 45-59 mL/min/1.73 square meter and albuminuria creatinine ratio between 30-299 mg/g ([place]) 06/05/2021   Esophageal dysphagia    Gastric erythema    Columnar-lined esophagus    Moderate malnutrition ([place]) 09/26/2020   Centrilobular emphysema ([place]) 03/30/2020   History of prolonged Q-T interval on ECG 11/18/2019   GAD (generalized anxiety disorder) 10/13/2019   MDD (major depressive disorder), recurrent episode, moderate ([place]) 10/13/2019   At risk for long QT syndrome 10/13/2019   Nonrheumatic mitral valve regurgitation 05/04/2019   Benign neoplasm of descending colon    Polyp of sigmoid colon    Polyneuropathy 10/21/2014   Chronic venous insufficiency 10/05/2014   Bilateral leg edema 08/16/2014   Major depression in partial remission ([place]) 08/16/2014   Acid reflux 08/16/2014    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   Asthma, moderate persistent 08/16/2014   Carpal tunnel syndrome 08/16/2014   Cervical pain 08/16/2014   CAFL (chronic airflow limitation) ([place]) 08/16/2014   Type 2 diabetes mellitus ([place]) 08/16/2014   Diabetes mellitus type 2, insulin dependent ([place]) 08/16/2014   Dyslipidemia 08/16/2014   Tobacco use disorder 08/16/2014   Essential (primary) hypertension 08/16/2014   Benign neoplasm of stomach 08/16/2014   Gout 08/16/2014   Mixed hyperlipidemia 08/16/2014   Low back pain 08/16/2014   Lumbar radiculopathy 08/16/2014   Headache, migraine 08/16/2014   Arthralgia of multiple joints 08/16/2014   Avitaminosis D 08/16/2014   Primary osteoarthritis of both knees 06/22/2014   Benign essential tremor 10/02/2013   Cervical dystonia 10/02/2013   Chronic diastolic heart failure ([place]) 11/16/2012   Chronic pain 11/13/2012    Past Surgical History:  Procedure Laterality Date   CARPAL TUNNEL RELEASE Bilateral    x2 right, 1x on left   COLONOSCOPY     COLONOSCOPY WITH PROPOFOL N/A 04/25/2017  Procedure: COLONOSCOPY WITH PROPOFOL;  Surgeon: [name], MD;  Location: [place];  Service: Endoscopy;  Laterality: N/A;  diabetic-oral med   DILATION AND CURETTAGE OF UTERUS     ESOPHAGOGASTRODUODENOSCOPY (EGD) WITH PROPOFOL N/A 01/10/2021   Procedure: ESOPHAGOGASTRODUODENOSCOPY (EGD) WITH PROPOFOL;  Surgeon: Virgel Manifold, MD;  Location: [place];  Service: Endoscopy;  Laterality: N/A;  Diabetic   EXTERNAL EAR SURGERY Left    x2   POLYPECTOMY  04/25/2017   Procedure: POLYPECTOMY INTESTINAL;  Surgeon: [name], MD;  Location: [place];  Service: Endoscopy;;   SPINE SURGERY     herniated disc   TUBAL LIGATION      Family History  Problem Relation Age of Onset   Emphysema Mother    Anxiety disorder Mother    Stroke Father    Throat cancer Father    Multiple sclerosis Daughter    Bipolar disorder Daughter     Cervical cancer Daughter    Bipolar disorder Daughter    Drug abuse Daughter    Lung cancer Maternal Aunt    Lung cancer Maternal Uncle    Lung cancer Maternal Grandmother    Lung cancer Maternal Grandfather     Social History   Tobacco Use   Smoking status: Every Day    Packs/day: 1.00    Years: 41.00    Total pack years: 41.00    Types: Cigarettes    Start date: 05/19/1977   Smokeless tobacco: Never  Substance Use Topics   Alcohol use: No    Alcohol/week: 0.0 standard drinks of alcohol     Current Outpatient Medications:    budesonide-formoterol (SYMBICORT) 160-4.5 MCG/ACT inhaler, Inhale 2 puffs into the lungs in the morning and at bedtime., Disp: 1 each, Rfl: 5   cetirizine (ZYRTEC) 10 MG tablet, Take 10 mg by mouth daily as needed for allergies., Disp: , Rfl:    ciprofloxacin (CIPRO) 250 MG tablet, Take 1 tablet (250 mg total) by mouth 2 (two) times daily for 3 days., Disp: 6 tablet, Rfl: 0   colchicine 0.6 MG tablet, TAKE 1 TABLET BY MOUTH ONCE DAILY, Disp: 30 tablet, Rfl: 0   cyclobenzaprine (FLEXERIL) 10 MG tablet, Take 10 mg by mouth at bedtime., Disp: , Rfl:    DULoxetine (CYMBALTA) 30 MG capsule, TAKE 1 CAPSULE BY MOUTH ONCE DAILY., Disp: 90 capsule, Rfl: 1   fluticasone (FLONASE) 50 MCG/ACT nasal spray, Place 2 sprays into both nostrils daily., Disp: 48 g, Rfl: 0   furosemide (LASIX) 40 MG tablet, Take 1 tablet (40 mg total) by mouth daily as needed., Disp: 30 tablet, Rfl: 2   gabapentin (NEURONTIN) 600 MG tablet, Take 1,200 mg by mouth 3 (three) times daily., Disp: , Rfl:    ipratropium (ATROVENT) 0.06 % nasal spray, Place 2 sprays into both nostrils 4 (four) times daily., Disp: 45 mL, Rfl: 0   ipratropium-albuterol (DUONEB) 0.5-2.5 (3) MG/3ML SOLN, Inhale 3 mLs into the lungs every 6 (six) hours as needed., Disp: 360 mL, Rfl: 3   levocetirizine (XYZAL) 5 MG tablet, Take 1 tablet (5 mg total) by mouth every evening., Disp: 90 tablet, Rfl: 1   lidocaine (LIDODERM) 5  %, 1 patch every 12 (twelve) hours as needed., Disp: , Rfl:    lubiprostone (AMITIZA) 24 MCG capsule, Take by mouth 2 (two) times daily as needed., Disp: , Rfl:    montelukast (SINGULAIR) 10 MG tablet, Take 1 tablet by mouth at bedtime., Disp: , Rfl:  morphine (MS CONTIN) 30 MG 12 hr tablet, Take 30 mg by mouth every 12 (twelve) hours., Disp: , Rfl:    morphine (MSIR) 15 MG tablet, Take 15 mg by mouth., Disp: , Rfl:    omeprazole (PRILOSEC) 40 MG capsule, Take 1 capsule (40 mg total) by mouth daily., Disp: 90 capsule, Rfl: 0   Oxycodone HCl 10 MG TABS, Take 10 mg by mouth 4 (four) times daily as needed., Disp: , Rfl:    promethazine (PHENERGAN) 25 MG tablet, Take 25 mg by mouth daily as needed., Disp: , Rfl:    QUEtiapine (SEROQUEL) 25 MG tablet, Take 1 tablet (25 mg total) by mouth at bedtime., Disp: 90 tablet, Rfl: 0   rizatriptan (MAXALT-MLT) 10 MG disintegrating tablet, Take by mouth., Disp: , Rfl:    theophylline (UNIPHYL) 400 MG 24 hr tablet, Take 1 tablet by mouth daily. , Disp: , Rfl:    tiotropium (SPIRIVA) 18 MCG inhalation capsule, Place 1 capsule into inhaler and inhale daily. pm, Disp: , Rfl:    VENTOLIN HFA 108 (90 Base) MCG/ACT inhaler, INHALE 1 PUFF BY MOUTH AS NEEDED, Disp: 18 g, Rfl: 0   busPIRone (BUSPAR) 7.5 MG tablet, Take 1 tablet (7.5 mg total) by mouth daily as needed., Disp: 30 tablet, Rfl: 0   carbidopa-levodopa (SINEMET IR) 25-100 MG tablet, Take 1.5 tablets by mouth 3 (three) times daily., Disp: , Rfl:    Empagliflozin-metFORMIN HCl (SYNJARDY) 12.5-500 MG TABS, Take 1 tablet by mouth 2 (two) times daily., Disp: 180 tablet, Rfl: 1   rosuvastatin (CRESTOR) 5 MG tablet, Take 1 tablet (5 mg total) by mouth at bedtime., Disp: 90 tablet, Rfl: 1  Allergies  Allergen Reactions   Augmentin [Amoxicillin-Pot Clavulanate] Diarrhea   Penicillins Itching    I personally reviewed active problem list, medication list, allergies, family history, social history, health maintenance  with the patient/caregiver today.   ROS  Ten systems reviewed and is negative except as mentioned in HPI  Objective  Vitals:   10/25/21 1304  BP: 138/72  Pulse: 99  Resp: 20  Temp: 98.4 F (36.9 C)  TempSrc: Oral  SpO2: 94%  Weight: 137 lb 14.4 oz (62.6 kg)  Height: 5' 8.5" (1.74 m)    Body mass index is 20.66 kg/m.  Physical Exam  Constitutional: Patient appears well-developed and malnourished  No distress.  HEENT: head atraumatic, normocephalic, pupils equal and reactive to light,, neck supple Cardiovascular: Normal rate, regular rhythm and normal heart sounds.  No murmur heard.1 plus  BLE edema. Pulmonary/Chest: bilateral wheezing and rhonchi  No respiratory distress. Abdominal: Soft.  There is no tenderness. Psychiatric: Patient has a normal mood and affect. behavior is normal. Judgment and thought content normal.   PHQ2/9:    09/14/2021    9:21 AM 09/08/2021    2:42 PM 08/21/2021    1:42 PM 07/11/2021    3:42 PM 07/06/2021   10:20 AM  Depression screen PHQ 2/9  Decreased Interest 0 '3 3 3 1  '$ Down, Depressed, Hopeless 0 '3 3 3 3  '$ PHQ - 2 Score 0 '6 6 6 4  '$ Altered sleeping 0 0 0 3 1  Tired, decreased energy 0 '3 3 3 2  '$ Change in appetite 0 0 0 0 3  Feeling bad or failure about yourself  0 0 0 0 0  Trouble concentrating 0 '1 1 3 2  '$ Moving slowly or fidgety/restless 0 0 0 0 0  Suicidal thoughts 0 0 0 0 0  PHQ-9  Score 0 '10 10 15 12  '$ Difficult doing work/chores Not difficult at all Very difficult   Somewhat difficult    phq 9 is negative   Fall Risk:    10/25/2021    1:23 PM 09/14/2021    9:20 AM 09/08/2021    2:41 PM 08/21/2021    1:37 PM 07/11/2021    3:42 PM  Fall Risk   Falls in the past year? '1 1 1 1 1  '$ Number falls in past yr: 1 0 '1 1 1  '$ Injury with Fall? '1 1 1 1 1  '$ Risk for fall due to : History of fall(s) History of fall(s) History of fall(s) No Fall Risks No Fall Risks  Follow up Falls evaluation completed;Education provided;Falls prevention discussed  Falls evaluation completed Falls evaluation completed;Falls prevention discussed;Education provided Falls prevention discussed Falls prevention discussed     Assessment & Plan  1. Type 2 diabetes mellitus with peripheral neuropathy (HCC)  - Empagliflozin-metFORMIN HCl (SYNJARDY) 12.5-500 MG TABS; Take 1 tablet by mouth 2 (two) times daily.  Dispense: 180 tablet; Refill: 1 - Lipid panel - COMPLETE METABOLIC PANEL WITH GFR  2. Moderate malnutrition ([place])  - Lipid panel  3. Atherosclerosis of aorta ([place])  On statin therapy   4. Centrilobular emphysema ([place])  Advised to stop smoking  5. Senile purpura ([place])  Stop using bandaids since it is making it work   6. Chronic diastolic heart failure ([place])  Gave her cardiologist number, due for follow up  7. COPD with asthma ([place])  Gave her pulmonologist number, needs follow up and repeat CT chest   8. Hypocalcemia   9. Calculus of gallbladder without cholecystitis without obstruction   10. Dysuria  - Urine Culture - POCT Urinalysis Dipstick - ciprofloxacin (CIPRO) 250 MG tablet; Take 1 tablet (250 mg total) by mouth 2 (two) times daily for 3 days.  Dispense: 6 tablet; Refill: 0  11. Frequent falls  - Ambulatory referral to Physical Therapy  12. Parkinson's disease ([place])  - Ambulatory referral to Physical Therapy  13. Dyslipidemia (high LDL; low HDL)   14. DM type 2 with diabetic dyslipidemia (HCC)  - rosuvastatin (CRESTOR) 5 MG tablet; Take 1 tablet (5 mg total) by mouth at bedtime.  Dispense: 90 tablet; Refill: 1  15. Perennial allergic rhinitis with seasonal variation  - levocetirizine (XYZAL) 5 MG tablet; Take 1 tablet (5 mg total) by mouth every evening.  Dispense: 90 tablet; Refill: 1 - ipratropium (ATROVENT) 0.06 % nasal spray; Place 2 sprays into both nostrils 4 (four) times daily.  Dispense: 45 mL; Refill: 0 - fluticasone (FLONASE) 50 MCG/ACT nasal spray; Place 2 sprays into both nostrils daily.   Dispense: 48 g; Refill: 0  16. GAD (generalized anxiety disorder)  - busPIRone (BUSPAR) 7.5 MG tablet; Take 1 tablet (7.5 mg total) by mouth daily as needed.  Dispense: 30 tablet; Refill: 0  17. Major depressive disorder with single episode, in partial remission (HCC)  - busPIRone (BUSPAR) 7.5 MG tablet; Take 1 tablet (7.5 mg total) by mouth daily as needed.  Dispense: 30 tablet; Refill: 0  18. Thyroid nodule  - Ambulatory referral to Endocrinology  19. Vitamin D deficiency  - VITAMIN D 25 Hydroxy (Vit-D Deficiency, Fractures)  20. Long-term use of high-risk medication  - CBC with Differential/Platelet

## 2021-10-25 ENCOUNTER — Ambulatory Visit (INDEPENDENT_AMBULATORY_CARE_PROVIDER_SITE_OTHER): Payer: Medicare Other | Admitting: Family Medicine

## 2021-10-25 ENCOUNTER — Encounter: Payer: Self-pay | Admitting: Family Medicine

## 2021-10-25 VITALS — BP 138/72 | HR 99 | Temp 98.4°F | Resp 20 | Ht 68.5 in | Wt 137.9 lb

## 2021-10-25 DIAGNOSIS — E1169 Type 2 diabetes mellitus with other specified complication: Secondary | ICD-10-CM

## 2021-10-25 DIAGNOSIS — I5032 Chronic diastolic (congestive) heart failure: Secondary | ICD-10-CM

## 2021-10-25 DIAGNOSIS — Z79899 Other long term (current) drug therapy: Secondary | ICD-10-CM

## 2021-10-25 DIAGNOSIS — E559 Vitamin D deficiency, unspecified: Secondary | ICD-10-CM

## 2021-10-25 DIAGNOSIS — E785 Hyperlipidemia, unspecified: Secondary | ICD-10-CM

## 2021-10-25 DIAGNOSIS — J432 Centrilobular emphysema: Secondary | ICD-10-CM

## 2021-10-25 DIAGNOSIS — K8020 Calculus of gallbladder without cholecystitis without obstruction: Secondary | ICD-10-CM

## 2021-10-25 DIAGNOSIS — E041 Nontoxic single thyroid nodule: Secondary | ICD-10-CM

## 2021-10-25 DIAGNOSIS — I700 Atherosclerosis of aorta: Secondary | ICD-10-CM

## 2021-10-25 DIAGNOSIS — D692 Other nonthrombocytopenic purpura: Secondary | ICD-10-CM

## 2021-10-25 DIAGNOSIS — E1142 Type 2 diabetes mellitus with diabetic polyneuropathy: Secondary | ICD-10-CM | POA: Diagnosis not present

## 2021-10-25 DIAGNOSIS — F324 Major depressive disorder, single episode, in partial remission: Secondary | ICD-10-CM

## 2021-10-25 DIAGNOSIS — R296 Repeated falls: Secondary | ICD-10-CM

## 2021-10-25 DIAGNOSIS — E440 Moderate protein-calorie malnutrition: Secondary | ICD-10-CM | POA: Diagnosis not present

## 2021-10-25 DIAGNOSIS — J3089 Other allergic rhinitis: Secondary | ICD-10-CM

## 2021-10-25 DIAGNOSIS — F411 Generalized anxiety disorder: Secondary | ICD-10-CM

## 2021-10-25 DIAGNOSIS — R300 Dysuria: Secondary | ICD-10-CM

## 2021-10-25 DIAGNOSIS — J449 Chronic obstructive pulmonary disease, unspecified: Secondary | ICD-10-CM

## 2021-10-25 DIAGNOSIS — G20 Parkinson's disease: Secondary | ICD-10-CM

## 2021-10-25 DIAGNOSIS — J302 Other seasonal allergic rhinitis: Secondary | ICD-10-CM

## 2021-10-25 LAB — POCT URINALYSIS DIPSTICK
Glucose, UA: NEGATIVE
Protein, UA: POSITIVE — AB
Spec Grav, UA: 1.01 (ref 1.010–1.025)
Urobilinogen, UA: 0.2 E.U./dL
pH, UA: 6 (ref 5.0–8.0)

## 2021-10-25 MED ORDER — SYNJARDY 12.5-500 MG PO TABS
1.0000 | ORAL_TABLET | Freq: Two times a day (BID) | ORAL | 1 refills | Status: DC
Start: 2021-10-25 — End: 2022-05-09

## 2021-10-25 MED ORDER — LEVOCETIRIZINE DIHYDROCHLORIDE 5 MG PO TABS
5.0000 mg | ORAL_TABLET | Freq: Every evening | ORAL | 1 refills | Status: DC
Start: 2021-10-25 — End: 2022-01-19

## 2021-10-25 MED ORDER — CIPROFLOXACIN HCL 250 MG PO TABS: 250.0000 mg | ORAL_TABLET | Freq: Two times a day (BID) | ORAL | 0 refills | Status: AC

## 2021-10-25 MED ORDER — ROSUVASTATIN CALCIUM 5 MG PO TABS: 5.0000 mg | ORAL_TABLET | Freq: Every day | ORAL | 1 refills | Status: DC

## 2021-10-25 MED ORDER — FLUTICASONE PROPIONATE 50 MCG/ACT NA SUSP: 2.0000 | Freq: Every day | NASAL | 0 refills | Status: DC

## 2021-10-25 MED ORDER — BUSPIRONE HCL 7.5 MG PO TABS: 7.5000 mg | ORAL_TABLET | Freq: Every day | ORAL | 0 refills | Status: DC | PRN

## 2021-10-25 MED ORDER — IPRATROPIUM BROMIDE 0.06 % NA SOLN: 2.0000 | Freq: Four times a day (QID) | NASAL | 0 refills | Status: DC

## 2021-10-25 NOTE — Patient Instructions (Addendum)
[facility] Cardiology: [phone] GI - Dr. Marius Ditch: [phone] Psychiatrist - Dr. [name]: [phone] Endo- Dr. Honor Junes: [phone]

## 2021-10-26 LAB — COMPLETE METABOLIC PANEL WITH GFR
AG Ratio: 1 (calc) (ref 1.0–2.5)
ALT: 18 U/L (ref 6–29)
AST: 24 U/L (ref 10–35)
Albumin: 2.9 g/dL — ABNORMAL LOW (ref 3.6–5.1)
Alkaline phosphatase (APISO): 128 U/L (ref 37–153)
BUN: 14 mg/dL (ref 7–25)
CO2: 26 mmol/L (ref 20–32)
Calcium: 8 mg/dL — ABNORMAL LOW (ref 8.6–10.4)
Chloride: 104 mmol/L (ref 98–110)
Creat: 0.82 mg/dL (ref 0.50–1.05)
Globulin: 2.9 g/dL (calc) (ref 1.9–3.7)
Glucose, Bld: 140 mg/dL — ABNORMAL HIGH (ref 65–99)
Potassium: 4.6 mmol/L (ref 3.5–5.3)
Sodium: 139 mmol/L (ref 135–146)
Total Bilirubin: 0.2 mg/dL (ref 0.2–1.2)
Total Protein: 5.8 g/dL — ABNORMAL LOW (ref 6.1–8.1)
eGFR: 81 mL/min/{1.73_m2} (ref >=60)

## 2021-10-26 LAB — CBC WITH DIFFERENTIAL/PLATELET
Absolute Monocytes: 655 cells/uL (ref 200–950)
Basophils Absolute: 60 cells/uL (ref 0–200)
Basophils Relative: 0.7 %
Eosinophils Absolute: 111 cells/uL (ref 15–500)
Eosinophils Relative: 1.3 %
HCT: 38 % (ref 35.0–45.0)
Hemoglobin: 12.8 g/dL (ref 11.7–15.5)
Lymphs Abs: 2890 cells/uL (ref 850–3900)
MCH: 32.9 pg (ref 27.0–33.0)
MCHC: 33.7 g/dL (ref 32.0–36.0)
MCV: 97.7 fL (ref 80.0–100.0)
MPV: 9.6 fL (ref 7.5–12.5)
Monocytes Relative: 7.7 %
Neutro Abs: 4786 cells/uL (ref 1500–7800)
Neutrophils Relative %: 56.3 %
Platelets: 389 10*3/uL (ref 140–400)
RBC: 3.89 10*6/uL (ref 3.80–5.10)
RDW: 12.9 % (ref 11.0–15.0)
Total Lymphocyte: 34 %
WBC: 8.5 10*3/uL (ref 3.8–10.8)

## 2021-10-26 LAB — LIPID PANEL
Cholesterol: 69 mg/dL (ref <200)
HDL: 27 mg/dL — ABNORMAL LOW (ref >=50)
LDL Cholesterol (Calc): 27 mg/dL (calc)
Non-HDL Cholesterol (Calc): 42 mg/dL (calc) (ref <130)
Total CHOL/HDL Ratio: 2.6 (calc) (ref <5.0)
Triglycerides: 66 mg/dL (ref <150)

## 2021-10-26 LAB — VITAMIN D 25 HYDROXY (VIT D DEFICIENCY, FRACTURES): Vit D, 25-Hydroxy: 21 ng/mL — ABNORMAL LOW (ref 30–100)

## 2021-10-28 LAB — URINE CULTURE
MICRO NUMBER:: 13879787
SPECIMEN QUALITY:: ADEQUATE

## 2021-11-06 ENCOUNTER — Encounter: Payer: Self-pay | Admitting: Family Medicine

## 2021-11-27 ENCOUNTER — Ambulatory Visit: Payer: Self-pay | Admitting: *Deleted

## 2021-11-27 NOTE — Telephone Encounter (Signed)
Patient called, no answer, mailbox is full.    Summary: urinary discomfort / rx req   The patient has called to share that they're experiencing urinary discomfort   The patient has noticed cloudiness in their urine and an uncomfortable feeling when using the restroom   The patient would like to be prescribed something for their discomfort   Please contact further when possible

## 2021-11-27 NOTE — Telephone Encounter (Signed)
Lvm for pt to call back to schedule an appt

## 2021-11-27 NOTE — Telephone Encounter (Signed)
Summary: urinary discomfort / rx req   The patient has called to share that they're experiencing urinary discomfort   The patient has noticed cloudiness in their urine and an uncomfortable feeling when using the restroom   The patient would like to be prescribed something for their discomfort   Please contact further when possible     Attempted to contact patient on number provided- no answer and mailbox is full.

## 2021-12-12 ENCOUNTER — Telehealth: Payer: Self-pay | Admitting: *Deleted

## 2021-12-12 ENCOUNTER — Other Ambulatory Visit: Payer: Self-pay | Admitting: Family Medicine

## 2021-12-12 DIAGNOSIS — F411 Generalized anxiety disorder: Secondary | ICD-10-CM

## 2021-12-12 DIAGNOSIS — F331 Major depressive disorder, recurrent, moderate: Secondary | ICD-10-CM

## 2021-12-12 NOTE — Telephone Encounter (Signed)
Left message on voicemail to return call to set up follow up [facility] CT scan.

## 2021-12-18 ENCOUNTER — Other Ambulatory Visit: Payer: Self-pay

## 2021-12-18 DIAGNOSIS — Z122 Encounter for screening for malignant neoplasm of respiratory organs: Secondary | ICD-10-CM

## 2021-12-18 DIAGNOSIS — R911 Solitary pulmonary nodule: Secondary | ICD-10-CM

## 2021-12-18 DIAGNOSIS — Z87891 Personal history of nicotine dependence: Secondary | ICD-10-CM

## 2021-12-26 NOTE — Progress Notes (Unsigned)
Name: Tricia [name]   MRN: [id number]    DOB: October 31, 1960   Date:12/27/2021       Progress Note  Subjective  Chief Complaint  Follow Up  HPI  Protein malnutrition: weight loss, without dieting She went to see GI and had EGD 11/22 , but she refused colonoscopy. She has not scheduled her mammogram yet.  She had CT abdomen that showed fatty liver , admitted for CAP with sepsis in July. She has a follow up with Endo and needs to have pap smear - but does not want to have it done today . Weight is stable since last visit   Parkinson's: she was diagnosed in 2021 by Dr. Trena Platt PA  with right hemibody parkinson's . She is taking Sinemet IR. She states still has balanced problems and tremors of both hands . She did not get PT , she still has balanced problems but not falling daily   Grieving: her 27 yo daughter died yesterday, she was at home, alive when EMS arrived but died before the ambulance left to the hospital    Diabetes type II ,currently Synjardi   A1C today is 6.2 %  She has dyslipidemia, and microalbuminuria.  She denies polyphagia, polydipsia or polyuria.  She has associated dyslipidemia  and CKI , albuminuria on SGL-2 agonist .BP is low now and off ACE/ARB also off lantus due to weight loss . Continue current regiment    Chronic pain: sees pain clinic, currently going to [facility], she states pain is still up and down, worse pain is on lumbar spine. Unchanged   Chronic nausea/gastritis/barrett's she has nausea, due for follow up with GI, advised to ask them for refill of promethazine, she states Zofran does not work well for her ,she has not gained weight.   Gout: she has flares intermittently, takes colchicine prn    Asthma and COPD and chronic airflow limitation/centrolobular emphysema :  under the care of Dr. Raul Del. She had CT repeat CT 08/2021 , found to have ground glass changes and will follow up with him soon, she is taking medication given by pulmonologist     08/2021  1. Lung-RADS 0, incomplete. Additional lung cancer screening CT images/or comparison to prior chest CT examinations is needed.   Extensive mid and lower lung zone predominant peribronchovascular ground-glass, nodularity and endobronchial debris, new from prior and most indicative of an infectious bronchiolitis. Recommend repeat low-dose lung cancer screening CT in 4-6 weeks, after appropriate therapy.   These results will be called to the ordering clinician or representative by the Radiologist Assistant, and communication documented in the PACS or [employer]. 2. Marked hepatic steatosis. 3. Aortic atherosclerosis (ICD10-I70.0). Coronary artery calcification. 4.  Emphysema (ICD10-J43.9).  Thyroid nodule: had US done by Dr. Manfred Shirts at [facility] 06/2019, she states they decided not to do a biopsy , repeat US in 22 did not recommend repeat biopsy , small lymphonodo on left side, seems reactive, she has a follow up scheduled with Endo next month    Major Depression: chronic and recurrent, phq 9 is high, she was under the care of Dr. [name] but lost to follow up . She recently lost her boyfriend of 20 plus years, he left her a lot of debt and not money to pay for it, she finally sold her house and moved in with her daughter and son in [place]. Her daughter died at age 45 , yesterday and she is upset    CHF: doing well  at this time,  she has orthopnea - she uses two pillows . Dr. Ubaldo Glassing retired, but needs to follow up with [facility] cardiology, she is on lasix  and SGL2 agonist since 08/22. Tolerating it well. She is not on ARB or ACE due to low bp. She is on statin therapy and tolerating it well, reviewed last labs   Echo from 06/05/2019  NORMAL LEFT VENTRICULAR SYSTOLIC FUNCTION  NORMAL RIGHT VENTRICULAR SYSTOLIC FUNCTION  MILD VALVULAR REGURGITATION (See above)  NO VALVULAR STENOSIS  Closest EF: >55% (Estimated)  Aortic: MILD AR  Mitral: MILD MR  Tricuspid: MILD TR   Echo showed enlarged left and right atrium   Atherosclerosis of Aorta: discussed CT , she is now taking statin therapy and last LDL has been at goal with medication  Senile purpura: stable and reassurance given   Patient Active Problem List   Diagnosis Date Noted   Nausea 09/08/2021   Hypoalbuminemia due to protein-calorie malnutrition ([place]) 09/08/2021   Lumbar herniated disc 07/11/2021   Calculus of gallbladder without cholecystitis without obstruction 07/11/2021   Senile purpura ([place]) 06/05/2021   Atherosclerosis of aorta ([place]) 06/05/2021   Parkinson's disease 06/05/2021   Chronic kidney disease (CKD) stage G3a/A2, moderately decreased glomerular filtration rate (GFR) between 45-59 mL/min/1.73 square meter and albuminuria creatinine ratio between 30-299 mg/g ([place]) 06/05/2021   Esophageal dysphagia    Gastric erythema    Columnar-lined esophagus    Moderate malnutrition ([place]) 09/26/2020   Centrilobular emphysema ([place]) 03/30/2020   History of prolonged Q-T interval on ECG 11/18/2019   GAD (generalized anxiety disorder) 10/13/2019   MDD (major depressive disorder), recurrent episode, moderate ([place]) 10/13/2019   At risk for long QT syndrome 10/13/2019   Nonrheumatic mitral valve regurgitation 05/04/2019   Benign neoplasm of descending colon    Polyp of sigmoid colon    Polyneuropathy 10/21/2014   Chronic venous insufficiency 10/05/2014   Bilateral leg edema 08/16/2014   Major depression in partial remission ([place]) 08/16/2014   Acid reflux 08/16/2014   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   Asthma, moderate persistent 08/16/2014   Carpal tunnel syndrome 08/16/2014   Cervical pain 08/16/2014   CAFL (chronic airflow limitation) ([place]) 08/16/2014   Type 2 diabetes mellitus ([place]) 08/16/2014   Diabetes mellitus type 2, insulin dependent ([place]) 08/16/2014   Dyslipidemia 08/16/2014   Tobacco use disorder 08/16/2014   Essential (primary) hypertension 08/16/2014   Benign neoplasm  of stomach 08/16/2014   Gout 08/16/2014   Mixed hyperlipidemia 08/16/2014   Low back pain 08/16/2014   Lumbar radiculopathy 08/16/2014   Headache, migraine 08/16/2014   Arthralgia of multiple joints 08/16/2014   Avitaminosis D 08/16/2014   Primary osteoarthritis of both knees 06/22/2014   Benign essential tremor 10/02/2013   Cervical dystonia 10/02/2013   Chronic diastolic heart failure ([place]) 11/16/2012   Chronic pain 11/13/2012    Past Surgical History:  Procedure Laterality Date   CARPAL TUNNEL RELEASE Bilateral    x2 right, 1x on left   COLONOSCOPY     COLONOSCOPY WITH PROPOFOL N/A 04/25/2017   Procedure: COLONOSCOPY WITH PROPOFOL;  Surgeon: [name], MD;  Location: [place];  Service: Endoscopy;  Laterality: N/A;  diabetic-oral med   DILATION AND CURETTAGE OF UTERUS     ESOPHAGOGASTRODUODENOSCOPY (EGD) WITH PROPOFOL N/A 01/10/2021   Procedure: ESOPHAGOGASTRODUODENOSCOPY (EGD) WITH PROPOFOL;  Surgeon: Virgel Manifold, MD;  Location: [place];  Service: Endoscopy;  Laterality: N/A;  Diabetic   EXTERNAL EAR SURGERY Left  x2   POLYPECTOMY  04/25/2017   Procedure: POLYPECTOMY INTESTINAL;  Surgeon: [name], MD;  Location: [place];  Service: Endoscopy;;   SPINE SURGERY     herniated disc   TUBAL LIGATION      Family History  Problem Relation Age of Onset   Emphysema Mother    Anxiety disorder Mother    Stroke Father    Throat cancer Father    Lung cancer Maternal Grandmother    Lung cancer Maternal Grandfather    Hypertension Daughter    Diabetes Daughter    Multiple sclerosis Daughter    Bipolar disorder Daughter    Cervical cancer Daughter    Bipolar disorder Daughter    Drug abuse Daughter    Lung cancer Maternal Aunt    Lung cancer Maternal Uncle     Social History   Tobacco Use   Smoking status: Every Day    Packs/day: 1.00    Years: 41.00    Total pack years: 41.00    Types: Cigarettes    Start date:  05/19/1977   Smokeless tobacco: Never  Substance Use Topics   Alcohol use: No    Alcohol/week: 0.0 standard drinks of alcohol     Current Outpatient Medications:    budesonide-formoterol (SYMBICORT) 160-4.5 MCG/ACT inhaler, Inhale 2 puffs into the lungs in the morning and at bedtime., Disp: 1 each, Rfl: 5   cetirizine (ZYRTEC) 10 MG tablet, Take 10 mg by mouth daily as needed for allergies., Disp: , Rfl:    colchicine 0.6 MG tablet, TAKE 1 TABLET BY MOUTH ONCE DAILY, Disp: 30 tablet, Rfl: 0   cyclobenzaprine (FLEXERIL) 10 MG tablet, Take 10 mg by mouth at bedtime., Disp: , Rfl:    Empagliflozin-metFORMIN HCl (SYNJARDY) 12.5-500 MG TABS, Take 1 tablet by mouth 2 (two) times daily., Disp: 180 tablet, Rfl: 1   fluticasone (FLONASE) 50 MCG/ACT nasal spray, Place 2 sprays into both nostrils daily., Disp: 48 g, Rfl: 0   furosemide (LASIX) 40 MG tablet, Take 1 tablet (40 mg total) by mouth daily as needed., Disp: 30 tablet, Rfl: 2   gabapentin (NEURONTIN) 600 MG tablet, Take 1,200 mg by mouth 3 (three) times daily., Disp: , Rfl:    hydrOXYzine (ATARAX) 25 MG tablet, Take 25 mg by mouth 2 (two) times daily., Disp: , Rfl:    ipratropium (ATROVENT) 0.06 % nasal spray, Place 2 sprays into both nostrils 4 (four) times daily., Disp: 45 mL, Rfl: 0   ipratropium-albuterol (DUONEB) 0.5-2.5 (3) MG/3ML SOLN, Inhale 3 mLs into the lungs every 6 (six) hours as needed., Disp: 360 mL, Rfl: 3   levocetirizine (XYZAL) 5 MG tablet, Take 1 tablet (5 mg total) by mouth every evening., Disp: 90 tablet, Rfl: 1   lidocaine (LIDODERM) 5 %, 1 patch every 12 (twelve) hours as needed., Disp: , Rfl:    lubiprostone (AMITIZA) 24 MCG capsule, Take by mouth 2 (two) times daily as needed., Disp: , Rfl:    montelukast (SINGULAIR) 10 MG tablet, Take 1 tablet by mouth at bedtime., Disp: , Rfl:    morphine (MS CONTIN) 30 MG 12 hr tablet, Take 30 mg by mouth every 12 (twelve) hours., Disp: , Rfl:    morphine (MSIR) 15 MG tablet, Take  15 mg by mouth., Disp: , Rfl:    omeprazole (PRILOSEC) 40 MG capsule, Take 1 capsule (40 mg total) by mouth daily., Disp: 90 capsule, Rfl: 0   Oxycodone HCl 10 MG TABS, Take 10 mg by  mouth 4 (four) times daily as needed., Disp: , Rfl:    promethazine (PHENERGAN) 25 MG tablet, Take 25 mg by mouth daily as needed., Disp: , Rfl:    rizatriptan (MAXALT-MLT) 10 MG disintegrating tablet, Take by mouth., Disp: , Rfl:    rosuvastatin (CRESTOR) 5 MG tablet, Take 1 tablet (5 mg total) by mouth at bedtime., Disp: 90 tablet, Rfl: 1   theophylline (UNIPHYL) 400 MG 24 hr tablet, Take 1 tablet by mouth daily. , Disp: , Rfl:    tiotropium (SPIRIVA) 18 MCG inhalation capsule, Place 1 capsule into inhaler and inhale daily. pm, Disp: , Rfl:    VENTOLIN HFA 108 (90 Base) MCG/ACT inhaler, INHALE 1 PUFF BY MOUTH AS NEEDED, Disp: 18 g, Rfl: 0   carbidopa-levodopa (SINEMET IR) 25-100 MG tablet, Take 1.5 tablets by mouth 3 (three) times daily., Disp: , Rfl:    DULoxetine (CYMBALTA) 60 MG capsule, Take 1 capsule (60 mg total) by mouth daily., Disp: 90 capsule, Rfl: 0   QUEtiapine (SEROQUEL) 25 MG tablet, Take 1 tablet (25 mg total) by mouth at bedtime., Disp: 90 tablet, Rfl: 0  Allergies  Allergen Reactions   Augmentin [Amoxicillin-Pot Clavulanate] Diarrhea   Penicillins Itching    I personally reviewed active problem list, medication list, allergies, family history, social history, health maintenance with the patient/caregiver today.   ROS  Constitutional: Negative for fever or weight change.  Respiratory: positive  for cough and shortness of breath.   Cardiovascular: Negative for chest pain or palpitations.  Gastrointestinal: Negative for abdominal pain, no bowel changes.  Musculoskeletal: positive or gait problem but no joint swelling.  Skin: fragile skin, senile purpura   Neurological: Negative for dizziness or headache.  No other specific complaints in a complete review of systems (except as listed in HPI  above).   Objective  Vitals:   12/27/21 1438  BP: 126/70  Pulse: 97  Resp: 16  SpO2: 95%  Weight: 138 lb (62.6 kg)  Height: '5\' 8"'$  (1.727 m)    Body mass index is 20.98 kg/m.  Physical Exam  Constitutional: Patient appears sad and malnourished  No distress.  HEENT: head atraumatic, normocephalic, pupils equal and reactive to light, neck supple Cardiovascular: Normal rate, regular rhythm and normal heart sounds.  No murmur heard. No BLE edema. Pulmonary/Chest: Effort normal and breath sounds normal. No respiratory distress. Abdominal: Soft.  There is no tenderness. Psychiatric: Patient has a normal mood and affect. behavior is normal. Judgment and thought content normal.  Skin: senile purpura    Diabetic Foot Exam: Diabetic Foot Exam - Simple   Simple Foot Form Visual Inspection See comments: Yes Sensation Testing Intact to touch and monofilament testing bilaterally: Yes Pulse Check Posterior Tibialis and Dorsalis pulse intact bilaterally: Yes Comments Bunion, thick toenails       PHQ2/9:    12/27/2021    2:40 PM 10/25/2021    1:52 PM 09/14/2021    9:21 AM 09/08/2021    2:42 PM 08/21/2021    1:42 PM  Depression screen PHQ 2/9  Decreased Interest 3 2 0 3 3  Down, Depressed, Hopeless 3 2 0 3 3  PHQ - 2 Score 6 4 0 6 6  Altered sleeping 3 2 0 0 0  Tired, decreased energy 3 2 0 3 3  Change in appetite 3 1 0 0 0  Feeling bad or failure about yourself  3 0 0 0 0  Trouble concentrating 3 3 0 1 1  Moving slowly or  fidgety/restless 0 3 0 0 0  Suicidal thoughts 0 0 0 0 0  PHQ-9 Score 21 15 0 10 10  Difficult doing work/chores Very difficult Somewhat difficult Not difficult at all Very difficult     phq 9 is positive - daughter died yesterday    Fall Risk:    12/27/2021    2:39 PM 10/25/2021    1:23 PM 09/14/2021    9:20 AM 09/08/2021    2:41 PM 08/21/2021    1:37 PM  Fall Risk   Falls in the past year? '1 1 1 1 1  '$ Number falls in past yr: 1 1 0 1 1  Injury with  Fall? 0 '1 1 1 1  '$ Risk for fall due to : Impaired balance/gait;Other (Comment) History of fall(s) History of fall(s) History of fall(s) No Fall Risks  Risk for fall due to: Comment Parkinson's- Loss of balance      Follow up Falls prevention discussed Falls evaluation completed;Education provided;Falls prevention discussed Falls evaluation completed Falls evaluation completed;Falls prevention discussed;Education provided Falls prevention discussed     Functional Status Survey: Is the patient deaf or have difficulty hearing?: Yes Does the patient have difficulty seeing, even when wearing glasses/contacts?: No Does the patient have difficulty concentrating, remembering, or making decisions?: Yes Does the patient have difficulty walking or climbing stairs?: Yes Does the patient have difficulty dressing or bathing?: No Does the patient have difficulty doing errands alone such as visiting a doctor's office or shopping?: No    Assessment & Plan  1. Type 2 diabetes mellitus with peripheral neuropathy (HCC)  - POCT HgB A1C - HM Diabetes Foot Exam - Urine Microalbumin w/creat. ratio  2. GAD (generalized anxiety disorder)  - DULoxetine (CYMBALTA) 60 MG capsule; Take 1 capsule (60 mg total) by mouth daily.  Dispense: 90 capsule; Refill: 0  3. MDD (major depressive disorder), recurrent episode, moderate (HCC)  - DULoxetine (CYMBALTA) 60 MG capsule; Take 1 capsule (60 mg total) by mouth daily.  Dispense: 90 capsule; Refill: 0 - QUEtiapine (SEROQUEL) 25 MG tablet; Take 1 tablet (25 mg total) by mouth at bedtime.  Dispense: 90 tablet; Refill: 0  4. Centrilobular emphysema ([place])  Keep follow up with pulmonologist   5. Atherosclerosis of aorta ([place])  On statin therapy   6. Senile purpura (HCC)  Senile purpura on both arms  7. Moderate malnutrition ([place])  She needs to increase protein intake  8. Chronic diastolic heart failure (HCC)  Stable at this time  9. Parkinson's disease  without dyskinesia or fluctuating manifestations  Under the care of neurologist

## 2021-12-27 ENCOUNTER — Encounter: Payer: Self-pay | Admitting: Family Medicine

## 2021-12-27 ENCOUNTER — Ambulatory Visit (INDEPENDENT_AMBULATORY_CARE_PROVIDER_SITE_OTHER): Payer: Medicare Other | Admitting: Family Medicine

## 2021-12-27 VITALS — BP 126/70 | HR 97 | Resp 16 | Ht 68.0 in | Wt 138.0 lb

## 2021-12-27 DIAGNOSIS — I700 Atherosclerosis of aorta: Secondary | ICD-10-CM

## 2021-12-27 DIAGNOSIS — E1142 Type 2 diabetes mellitus with diabetic polyneuropathy: Secondary | ICD-10-CM | POA: Diagnosis not present

## 2021-12-27 DIAGNOSIS — E440 Moderate protein-calorie malnutrition: Secondary | ICD-10-CM

## 2021-12-27 DIAGNOSIS — D692 Other nonthrombocytopenic purpura: Secondary | ICD-10-CM

## 2021-12-27 DIAGNOSIS — F411 Generalized anxiety disorder: Secondary | ICD-10-CM

## 2021-12-27 DIAGNOSIS — F331 Major depressive disorder, recurrent, moderate: Secondary | ICD-10-CM | POA: Diagnosis not present

## 2021-12-27 DIAGNOSIS — G20A1 Parkinson's disease without dyskinesia, without mention of fluctuations: Secondary | ICD-10-CM

## 2021-12-27 DIAGNOSIS — J432 Centrilobular emphysema: Secondary | ICD-10-CM

## 2021-12-27 DIAGNOSIS — I5032 Chronic diastolic (congestive) heart failure: Secondary | ICD-10-CM

## 2021-12-27 LAB — POCT GLYCOSYLATED HEMOGLOBIN (HGB A1C): Hemoglobin A1C: 6.2 % — AB (ref 4.0–5.6)

## 2021-12-27 MED ORDER — DULOXETINE HCL 60 MG PO CPEP: 60.0000 mg | ORAL_CAPSULE | Freq: Every day | ORAL | 0 refills | Status: DC

## 2021-12-27 MED ORDER — QUETIAPINE FUMARATE 25 MG PO TABS: 25.0000 mg | ORAL_TABLET | Freq: Every day | ORAL | 0 refills | Status: DC

## 2022-01-08 ENCOUNTER — Ambulatory Visit: Admission: RE | Admit: 2022-01-08 | Payer: Medicare Other | Source: Ambulatory Visit

## 2022-01-16 ENCOUNTER — Encounter: Payer: Self-pay | Admitting: Family Medicine

## 2022-01-16 ENCOUNTER — Ambulatory Visit: Payer: Medicare Other | Admitting: Family Medicine

## 2022-01-16 ENCOUNTER — Ambulatory Visit
Admission: RE | Admit: 2022-01-16 | Discharge: 2022-01-16 | Disposition: A | Discharge status: Home / Self Care | Payer: Medicare Other | Source: Ambulatory Visit | Attending: Acute Care | Admitting: Acute Care

## 2022-01-16 ENCOUNTER — Ambulatory Visit
Admission: RE | Admit: 2022-01-16 | Discharge: 2022-01-16 | Disposition: A | Discharge status: Home / Self Care | Payer: Medicare Other | Source: Ambulatory Visit | Attending: Family Medicine | Admitting: Family Medicine

## 2022-01-16 DIAGNOSIS — Z87891 Personal history of nicotine dependence: Secondary | ICD-10-CM | POA: Insufficient documentation

## 2022-01-16 DIAGNOSIS — R911 Solitary pulmonary nodule: Secondary | ICD-10-CM | POA: Insufficient documentation

## 2022-01-16 DIAGNOSIS — S134XXA Sprain of ligaments of cervical spine, initial encounter: Secondary | ICD-10-CM | POA: Diagnosis not present

## 2022-01-16 DIAGNOSIS — Z122 Encounter for screening for malignant neoplasm of respiratory organs: Secondary | ICD-10-CM | POA: Diagnosis present

## 2022-01-16 MED ORDER — TIZANIDINE HCL 4 MG PO TABS: 4.0000 mg | ORAL_TABLET | Freq: Four times a day (QID) | ORAL | 0 refills | Status: DC | PRN
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
## 2022-01-18 ENCOUNTER — Other Ambulatory Visit: Payer: Self-pay

## 2022-01-18 ENCOUNTER — Telehealth: Payer: Self-pay | Admitting: Acute Care

## 2022-01-18 ENCOUNTER — Other Ambulatory Visit: Payer: Self-pay | Admitting: Family Medicine

## 2022-01-18 DIAGNOSIS — R911 Solitary pulmonary nodule: Secondary | ICD-10-CM

## 2022-01-18 DIAGNOSIS — Z87891 Personal history of nicotine dependence: Secondary | ICD-10-CM

## 2022-01-18 DIAGNOSIS — J302 Other seasonal allergic rhinitis: Secondary | ICD-10-CM

## 2022-01-18 DIAGNOSIS — Z122 Encounter for screening for malignant neoplasm of respiratory organs: Secondary | ICD-10-CM

## 2022-01-18 NOTE — Telephone Encounter (Signed)
I have attempted to call the patient with the results of their  Low Dose CT Chest Lung cancer screening scan. There was no answer. I reached a VM which per recording was full, and I was unable to leave a message.   This scan was read as a LR 0.    Previously seen lower lung predominant centrilobular nodules have resolved or are decreased in size. However, numerous new centrilobular nodules and patchy areas of nodular consolidation are seen today's exam, findings are likely due to chronic atypical infection or recurrent aspiration. Follow-up chest CT is recommended in 3 months to ensure resolution. Lung-RADS 0, incomplete.  She is followed by Dr. Vella Kohler at [facility]. I will call their office and make sure they can see her before she has her follow up CT Chest  in 3 months.   I spoke with April at Dr. Joanell Rising office . She will call the patient and arrange for her to be seen by Dr. Vella Kohler to treat the nodular consolidation are seen on the CT scan. These  findings are likely due to chronic atypical infection or recurrent aspiration. Follow-up chest CT is recommended in 3 months to ensure resolution.The screening program at [facility] will order and schedule the follow up scan, we will leave the management of the atypical findings to Dr. Vella Kohler as we communicated to April in his office. We will communicate follow up scan results to his office also.  Denise, 3 month follow up CT Chest and please send a letter asking her to call the office so we can explain the plan to her in detail.

## 2022-01-18 NOTE — Telephone Encounter (Signed)
I attempted to call Tricia [name] again and was able to get in touch with her at this time.  I explained to her that her scan was read as a lung RADS 0 again.  Her scan in July was also a lung RADS 0 and had similar consolidation and infection.  I did discuss with the patient whether or not she felt like she got choked when she ate.  She stated that she does get choked when she eats.  I explained to her that sometimes if we aspirate little bits of food into her lungs that they can create a pneumonia and that we think this may be what is occurring.  We discussed that physical therapy may be an option where they can teach her swallowing techniques to decrease the risk of this happening in the future.  Again we have called Dr. Gust Brooms office and his nurse Tricia [name] will call the patient to get her scheduled to be seen.  I have asked her to make sure she lets them know that she gets choked on food so that he can investigate the potential etiology of aspiration and her recurrent pneumonias. I explained that we will do a 52-monthfollow-up with the hopes that we will get a clear x-ray. I did explain that she will get a call from Dr. FGust Broomsoffice to get scheduled to see him to address the continued infection in her lungs. She verbalized understanding of the above and had no further questions. As in the earlier telephone message she will need a 370-monthollow-up.  Thanks so much.

## 2022-01-18 NOTE — Telephone Encounter (Signed)
See other telephone note.  

## 2022-01-18 NOTE — Telephone Encounter (Signed)
Results/plan to PCP.  New order placed for LCS LDCT nodule follow up for 3 months

## 2022-01-24 ENCOUNTER — Ambulatory Visit: Payer: Medicare Other | Admitting: Family Medicine

## 2022-01-31 ENCOUNTER — Encounter: Payer: Self-pay | Admitting: Physician Assistant

## 2022-01-31 ENCOUNTER — Ambulatory Visit (INDEPENDENT_AMBULATORY_CARE_PROVIDER_SITE_OTHER): Payer: Medicare Other | Admitting: Physician Assistant

## 2022-01-31 DIAGNOSIS — Z0000 Encounter for general adult medical examination without abnormal findings: Secondary | ICD-10-CM

## 2022-01-31 NOTE — Progress Notes (Signed)
Virtual Visit via Telephone Note  I connected with  Tricia [name] on 01/31/22 at 10:00 AM  by telephone and verified that I am speaking with the correct person using two identifiers.  Location: Patient: At home [place], [place] Provider: [place], [place]     I discussed the limitations, risks, security and privacy concerns of performing an evaluation and management service by telephone and the availability of in person appointments. The patient expressed understanding and agreed to proceed.  Interactive audio and video telecommunications were attempted between this nurse and patient, however failed, due to patient having technical difficulties OR patient did not have access to video capability.  We continued and completed visit with audio only.  Some vital signs may be absent or patient reported.   Today's Provider: Talitha Givens, MHS, PA-C Introduced myself to the patient as a PA-C and provided education on APPs in clinical practice.      Subjective:   Tricia [name] is a 61 y.o. female who presents for Medicare Annual (Subsequent) preventive examination.  Review of Systems:         Objective:     Vitals: There were no vitals taken for this visit.  There is no height or weight on file to calculate BMI.     09/09/2021   10:47 AM 09/08/2021    3:44 PM 07/06/2021   11:45 AM 01/31/2021    2:01 PM 01/10/2021    7:22 AM 12/04/2020    8:12 PM 01/28/2020    2:27 PM  Advanced Directives  Does Patient Have a Medical Advance Directive?  _0  No  Would patient like information on creating a medical advance directive? Yes (Inpatient - patient requests chaplain consult to create a medical advance directive)  No - Patient declined No - Patient declined Yes (MAU/Ambulatory/Procedural Areas - Information given)  No - Patient declined    Tobacco Social History   Tobacco Use  Smoking Status Every Day   Packs/day: 1.00   Years: 41.00    Total pack years: 41.00   Types: Cigarettes   Start date: 05/19/1977  Smokeless Tobacco Never     Ready to quit: Not Answered Counseling given: Not Answered   Clinical Intake:  Pre-visit preparation completed: Yes  Pain : 0-10 Pain Score: 9  Pain Type: Chronic pain Pain Location: Neck Pain Descriptors / Indicators: Aching Pain Onset: More than a month ago Pain Frequency: Constant     Nutritional Status: BMI of 19-24  Normal Nutritional Risks: None Diabetes: Yes CBG done?: No Did pt. bring in CBG monitor from home?: No  How often do you need to have someone help you when you read instructions, pamphlets, or other written materials from your doctor or pharmacy?: 1 - Never What is the last grade level you completed in school?: Associate Degree  Interpreter Needed?: No     Past Medical History:  Diagnosis Date   Anxiety    Arthritis    joints and hands/ knees   Asthma    uses inhaler   Benign essential tremor    head   Cervical dystonia    neck pain   Cholesteatoma of left ear    x2   COPD (chronic obstructive pulmonary disease) (HCC)    Cough    Depression    Diabetes mellitus without complication (HCC)    type 2   Diastolic dysfunction    Dyspnea    Dysrhythmia    diastolic dysfunction  GERD (gastroesophageal reflux disease)    Headache    migraines/ one per week   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   Hyperlipidemia    Hypertension    Motion sickness    boat   Neuromuscular disorder ([place])    neuropathy feet and hands( nerve damage)   Wears dentures    upper and lower   Past Surgical History:  Procedure Laterality Date   CARPAL TUNNEL RELEASE Bilateral    x2 right, 1x on left   COLONOSCOPY     COLONOSCOPY WITH PROPOFOL N/A 04/25/2017   Procedure: COLONOSCOPY WITH PROPOFOL;  Surgeon: [name], MD;  Location: [facility];  Service: Endoscopy;  Laterality: N/A;  diabetic-oral med   DILATION AND CURETTAGE OF UTERUS      ESOPHAGOGASTRODUODENOSCOPY (EGD) WITH PROPOFOL N/A 01/10/2021   Procedure: ESOPHAGOGASTRODUODENOSCOPY (EGD) WITH PROPOFOL;  Surgeon: Virgel Manifold, MD;  Location: [facility];  Service: Endoscopy;  Laterality: N/A;  Diabetic   EXTERNAL EAR SURGERY Left    x2   POLYPECTOMY  04/25/2017   Procedure: POLYPECTOMY INTESTINAL;  Surgeon: [name], MD;  Location: [facility];  Service: Endoscopy;;   SPINE SURGERY     herniated disc   TUBAL LIGATION     Family History  Problem Relation Age of Onset   Emphysema Mother    Anxiety disorder Mother    Stroke Father    Throat cancer Father    Lung cancer Maternal Grandmother    Lung cancer Maternal Grandfather    Hypertension Daughter    Diabetes Daughter    Multiple sclerosis Daughter    Bipolar disorder Daughter    Cervical cancer Daughter    Bipolar disorder Daughter    Drug abuse Daughter    Lung cancer Maternal Aunt    Lung cancer Maternal Uncle    Social History   Socioeconomic History   Marital status: Widowed    Spouse name: Not on file   Number of children: 2   Years of education: Not on file   Highest education level: Associate degree: academic program  Occupational History   Occupation: Disability  Tobacco Use   Smoking status: Every Day    Packs/day: 1.00    Years: 41.00    Total pack years: 41.00    Types: Cigarettes    Start date: 05/19/1977   Smokeless tobacco: Never  Vaping Use   Vaping Use: Former  Substance and Sexual Activity   Alcohol use: No    Alcohol/week: 0.0 standard drinks of alcohol   Drug use: No   Sexual activity: Not Currently    Birth control/protection: None  Other Topics Concern   Not on file  Social History Narrative   Living with her daughter now    Social Determinants of Health   Financial Resource Strain: Low Risk  (09/05/2021)   Overall Financial Resource Strain (CARDIA)    Difficulty of Paying Living Expenses: Not very hard  Food Insecurity: No Food  Insecurity (01/31/2021)   Hunger Vital Sign    Worried About Running Out of Food in the Last Year: Never true    [place] in the Last Year: Never true  Transportation Needs: No Transportation Needs (01/31/2021)   PRAPARE - Hydrologist (Medical): No    Lack of Transportation (Non-Medical): No  Physical Activity: Inactive (01/31/2021)   Exercise Vital Sign    Days of Exercise per Week: 0  days    Minutes of Exercise per Session: 0 min  Stress: No Stress Concern Present (01/31/2021)   [place]    Feeling of Stress : Only a little  Social Connections: Moderately Isolated (01/31/2021)   Social Connection and Isolation Panel [NHANES]    Frequency of Communication with Friends and Family: More than three times a week    Frequency of Social Gatherings with Friends and Family: Once a week    Attends Religious Services: Never    Marine scientist or Organizations: Not on file    Attends Archivist Meetings: Never    Marital Status: Living with partner    Outpatient Encounter Medications as of 01/31/2022  Medication Sig   budesonide-formoterol (SYMBICORT) 160-4.5 MCG/ACT inhaler Inhale 2 puffs into the lungs in the morning and at bedtime.   cetirizine (ZYRTEC) 10 MG tablet Take 10 mg by mouth daily as needed for allergies.   colchicine 0.6 MG tablet TAKE 1 TABLET BY MOUTH ONCE DAILY   cyclobenzaprine (FLEXERIL) 10 MG tablet Take 10 mg by mouth at bedtime.   doxycycline (VIBRA-TABS) 100 MG tablet Take by mouth.   DULoxetine (CYMBALTA) 60 MG capsule Take 1 capsule (60 mg total) by mouth daily.   Empagliflozin-metFORMIN HCl (SYNJARDY) 12.5-500 MG TABS Take 1 tablet by mouth 2 (two) times daily.   fluticasone (FLONASE) 50 MCG/ACT nasal spray Place 2 sprays into both nostrils daily.   furosemide (LASIX) 40 MG tablet Take 1 tablet (40 mg total) by mouth daily as needed.    gabapentin (NEURONTIN) 600 MG tablet Take 1,200 mg by mouth 3 (three) times daily.   hydrOXYzine (ATARAX) 25 MG tablet Take 25 mg by mouth 2 (two) times daily.   ipratropium (ATROVENT) 0.06 % nasal spray Place 2 sprays into both nostrils 4 (four) times daily.   ipratropium-albuterol (DUONEB) 0.5-2.5 (3) MG/3ML SOLN Inhale 3 mLs into the lungs every 6 (six) hours as needed.   levocetirizine (XYZAL) 5 MG tablet TAKE 1 TABLET BY MOUTH ONCE EVERY EVENING   lidocaine (LIDODERM) 5 % 1 patch every 12 (twelve) hours as needed.   lubiprostone (AMITIZA) 24 MCG capsule Take by mouth 2 (two) times daily as needed.   methylPREDNISolone (MEDROL DOSEPAK) 4 MG TBPK tablet See admin instructions.   montelukast (SINGULAIR) 10 MG tablet Take 1 tablet by mouth at bedtime.   morphine (MS CONTIN) 30 MG 12 hr tablet Take 30 mg by mouth every 12 (twelve) hours.   morphine (MSIR) 15 MG tablet Take 15 mg by mouth.   omeprazole (PRILOSEC) 40 MG capsule Take 1 capsule (40 mg total) by mouth daily.   Oxycodone HCl 10 MG TABS Take 10 mg by mouth 4 (four) times daily as needed.   promethazine (PHENERGAN) 25 MG tablet Take 25 mg by mouth daily as needed.   QUEtiapine (SEROQUEL) 25 MG tablet Take 1 tablet (25 mg total) by mouth at bedtime.   rizatriptan (MAXALT-MLT) 10 MG disintegrating tablet Take by mouth.   rosuvastatin (CRESTOR) 5 MG tablet Take 1 tablet (5 mg total) by mouth at bedtime.   theophylline (UNIPHYL) 400 MG 24 hr tablet Take 1 tablet by mouth daily.    tiotropium (SPIRIVA) 18 MCG inhalation capsule Place 1 capsule into inhaler and inhale daily. pm   tiZANidine (ZANAFLEX) 4 MG tablet Take 1 tablet (4 mg total) by mouth every 6 (six) hours as needed for muscle spasms.   VENTOLIN HFA 108 (90  Base) MCG/ACT inhaler INHALE 1 PUFF BY MOUTH AS NEEDED   carbidopa-levodopa (SINEMET IR) 25-100 MG tablet Take 1.5 tablets by mouth 3 (three) times daily.   No facility-administered encounter medications on file as of  01/31/2022.    Activities of Daily Living    01/31/2022   10:07 AM 01/31/2022    9:20 AM  In your present state of health, do you have any difficulty performing the following activities:  Hearing? 1 1  Comment states she is partially deaf in her left ear   Vision? 0 0  Difficulty concentrating or making decisions? 1 1  Walking or climbing stairs? 1 1  Dressing or bathing? 0 0  Doing errands, shopping? 0 0  Preparing Food and eating ? N   Using the Toilet? N   In the past six months, have you accidently leaked urine? Y   Do you have problems with loss of bowel control? N   Managing your Medications? N   Managing your Finances? N   Housekeeping or managing your Housekeeping? N     Patient Care Team: [name], MD as PCP - General (Family Medicine) Erby Pian, MD as Consulting Physician (Pulmonary Disease) [name], MD as Consulting Physician (Neurology) Ubaldo Glassing Javier Docker, MD as Consulting Physician (Cardiology) Germaine Pomfret, [facility] (Pharmacist) Zorita Pang, [place] as Physician Assistant (Pain Medicine) Virgel Manifold, MD (Inactive) as Consulting Physician (Gastroenterology)    Assessment:   This is a routine wellness examination for [place].  Exercise Activities and Dietary recommendations Type of exercise: walking, Time (Minutes): 10, Frequency (Times/Week): 7, Weekly Exercise (Minutes/Week): 70, Exercise limited by: orthopedic condition(s)   Goals Addressed   None     Fall Risk:    01/31/2022    9:19 AM 01/16/2022   11:04 AM 12/27/2021    2:39 PM 10/25/2021    1:23 PM 09/14/2021    9:20 AM  Fall Risk   Falls in the past year? _0 Number falls in past yr: _1 0  Injury with Fall? 1 0 0 1 1  Risk for fall due to : Impaired balance/gait;History of fall(s) History of fall(s) Impaired balance/gait;Other (Comment) History of fall(s) History of fall(s)  Risk for fall due to: Comment   Parkinson's- Loss of balance    Follow up  Falls prevention discussed;Education provided;Falls evaluation completed Falls prevention discussed;Falls evaluation completed;Education provided Falls prevention discussed Falls evaluation completed;Education provided;Falls prevention discussed Falls evaluation completed    FALL RISK PREVENTION PERTAINING TO THE HOME:  Any stairs in or around the home? Yes  If so, are there any without handrails? Yes   Home free of loose throw rugs in walkways, pet beds, electrical cords, etc? Yes  Adequate lighting in your home to reduce risk of falls? Yes   ASSISTIVE DEVICES UTILIZED TO PREVENT FALLS:  Life alert? No  Use of a cane, walker or w/c? Yes - She sometimes uses a walker and has a wheelchair as needed.  Grab bars in the bathroom? No  Shower chair or bench in shower? No  Elevated toilet seat or a handicapped toilet? No   DME ORDERS:  DME order needed?  No   TIMED UP AND GO:  Was the test performed? No .  Length of time to ambulate 10 feet: 0 sec.      Depression Screen    01/31/2022    9:20 AM 01/16/2022   11:05 AM 12/27/2021  2:40 PM 10/25/2021    1:52 PM  PHQ 2/9 Scores  PHQ - 2 Score _0 PHQ- 9 Score _1 Cognitive Function        01/31/2022   10:10 AM 01/28/2020    2:30 PM 01/27/2019    2:04 PM 01/23/2018    2:18 PM 01/01/2017    2:33 PM  6CIT Screen  What Year? 0 points 0 points 0 points 0 points 0 points  What month? 0 points 0 points 0 points 0 points 0 points  What time? 0 points 0 points 0 points 0 points 0 points  Count back from 20 0 points 0 points 0 points 0 points 0 points  Months in reverse 2 points 0 points 0 points 0 points 0 points  Repeat phrase 0 points 0 points 0 points 0 points 0 points  Total Score 2 points 0 points 0 points 0 points 0 points    Immunization History  Administered Date(s) Administered   Influenza,inj,Quad PF,6+ Mos 01/24/2015, 12/19/2016, 10/29/2017, 10/14/2019, 01/31/2021   Influenza-Unspecified  12/09/2015   PFIZER(Purple Top)SARS-COV-2 Vaccination 05/15/2019, 06/09/2019   Pneumococcal Polysaccharide-23 02/19/2009   Tdap 12/09/2012    Qualifies for Shingles Vaccine? Yes  Due for Shingrix. Education has been provided regarding the importance of this vaccine. Pt has been advised to call insurance company to determine out of pocket expense. Advised may also receive vaccine at local pharmacy or Health Dept. Verbalized acceptance and understanding.  Tdap: Although this vaccine is not a covered service during a Wellness Exam, does the patient still wish to receive this vaccine today?   Completed .  Education has been provided regarding the importance of this vaccine. Advised may receive this vaccine at local pharmacy or Health Dept. Aware to provide a copy of the vaccination record if obtained from local pharmacy or Health Dept. Verbalized acceptance and understanding.  Flu Vaccine: Due for Flu vaccine. Does the patient want to receive this vaccine today?  Yes . Education has been provided regarding the importance of this vaccine but still declined. Advised may receive this vaccine at local pharmacy or Health Dept. Aware to provide a copy of the vaccination record if obtained from local pharmacy or Health Dept. Verbalized acceptance and understanding.  Pneumococcal Vaccine: Due for Pneumococcal vaccine. Does the patient want to receive this vaccine today?   Completed . Education has been provided regarding the importance of this vaccine but still declined. Advised may receive this vaccine at local pharmacy or Health Dept. Aware to provide a copy of the vaccination record if obtained from local pharmacy or Health Dept. Verbalized acceptance and understanding.   Covid-19 Vaccine:  Completed vaccines  Screening Tests Health Maintenance  Topic Date Due   MAMMOGRAM  Never done   PAP SMEAR-Modifier  Never done   OPHTHALMOLOGY EXAM  02/27/2020   Diabetic kidney evaluation - Urine ACR  09/26/2021    Medicare Annual Wellness (AWV)  01/31/2022   COVID-19 Vaccine (3 - Pfizer risk series) 02/16/2022 (Originally 07/07/2019)   Zoster Vaccines- Shingrix (1 of 2) 05/02/2022 (Originally 09/19/1979)   INFLUENZA VACCINE  05/20/2022 (Originally 09/19/2021)   HEMOGLOBIN A1C  06/27/2022   Diabetic kidney evaluation - eGFR measurement  10/26/2022   DTaP/Tdap/Td (2 - Td or Tdap) 12/10/2022   FOOT EXAM  12/28/2022   Lung Cancer Screening  01/17/2023   COLONOSCOPY (Pts 45-72yr Insurance coverage will need to be confirmed)  04/26/2027   Hepatitis C  Screening  Completed   HIV Screening  Completed   HPV VACCINES  Aged Out    Cancer Screenings:  Colorectal Screening: Completed 04/25/2017. Repeat every 10 years  Mammogram: Ordered 08/21/2021. Pt provided with contact info and advised to call to schedule appt.   Bone Density: Declined  Lung Cancer Screening: (Low Dose CT Chest recommended if Age 63-80 years, 30 pack-year currently smoking OR have quit w/in 15years.) does not qualify.     Additional Screening:  Hepatitis C Screening: does qualify; Completed 08/27/2018  Vision Screening: Recommended annual ophthalmology exams for early detection of glaucoma and other disorders of the eye. Is the patient up to date with their annual eye exam?  Yes  Who is the provider or what is the name of the office in which the pt attends annual eye exams? [facility]    Dental Screening: Recommended annual dental exams for proper oral hygiene  Community Resource Referral:  CRR required this visit?  No       Plan:  I have personally reviewed and addressed the Medicare Annual Wellness questionnaire and have noted the following in the patient's chart:  A. Medical and social history B. Use of alcohol, tobacco or illicit drugs  C. Current medications and supplements D. Functional ability and status E.  Nutritional status F.  Physical activity G. Advance directives H. List of other physicians I.   Hospitalizations, surgeries, and ER visits in previous 12 months J.  [place] such as hearing and vision if needed, cognitive and depression L. Referrals and appointments   In addition, I have reviewed and discussed with patient certain preventive protocols, quality metrics, and best practice recommendations. A written personalized care plan for preventive services as well as general preventive health recommendations were provided to patient.  Signed,    Talitha Givens, MHS, PA-C [facility]

## 2022-02-01 ENCOUNTER — Ambulatory Visit: Payer: Medicare Other

## 2022-02-06 NOTE — Progress Notes (Deleted)
Name: Tricia [name]   MRN: [id number]    DOB: [birth year]   Date:02/06/2022       Progress Note  Subjective  Chief Complaint  Follow Up  HPI  *** Patient Active Problem List   Diagnosis Date Noted   Nausea 09/08/2021   Hypoalbuminemia due to protein-calorie malnutrition ([place]) 09/08/2021   Lumbar herniated disc 07/11/2021   Calculus of gallbladder without cholecystitis without obstruction 07/11/2021   Senile purpura ([place]) 06/05/2021   Atherosclerosis of aorta ([place]) 06/05/2021   Parkinson's disease 06/05/2021   Chronic kidney disease (CKD) stage G3a/A2, moderately decreased glomerular filtration rate (GFR) between 45-59 mL/min/1.73 square meter and albuminuria creatinine ratio between 30-299 mg/g ([place]) 06/05/2021   Esophageal dysphagia    Gastric erythema    Columnar-lined esophagus    Moderate malnutrition ([place]) 09/26/2020   Centrilobular emphysema ([place]) 03/30/2020   History of prolonged Q-T interval on ECG 11/18/2019   GAD (generalized anxiety disorder) 10/13/2019   MDD (major depressive disorder), recurrent episode, moderate ([place]) 10/13/2019   At risk for long QT syndrome 10/13/2019   Nonrheumatic mitral valve regurgitation 05/04/2019   Benign neoplasm of descending colon    Polyp of sigmoid colon    Polyneuropathy 10/21/2014   Chronic venous insufficiency 10/05/2014   Bilateral leg edema 08/16/2014   Major depression in partial remission ([place]) 08/16/2014   Acid reflux 08/16/2014   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   Asthma, moderate persistent 08/16/2014   Carpal tunnel syndrome 08/16/2014   Cervical pain 08/16/2014   CAFL (chronic airflow limitation) ([place]) 08/16/2014   Type 2 diabetes mellitus ([place]) 08/16/2014   Diabetes mellitus type 2, insulin dependent ([place]) 08/16/2014   Dyslipidemia 08/16/2014   Tobacco use disorder 08/16/2014   Essential (primary) hypertension 08/16/2014   Benign neoplasm of stomach 08/16/2014   Gout 08/16/2014   Mixed  hyperlipidemia 08/16/2014   Low back pain 08/16/2014   Lumbar radiculopathy 08/16/2014   Headache, migraine 08/16/2014   Arthralgia of multiple joints 08/16/2014   Avitaminosis D 08/16/2014   Primary osteoarthritis of both knees 06/22/2014   Benign essential tremor 10/02/2013   Cervical dystonia 10/02/2013   Chronic diastolic heart failure ([place]) 11/16/2012   Chronic pain 11/13/2012    Past Surgical History:  Procedure Laterality Date   CARPAL TUNNEL RELEASE Bilateral    x2 right, 1x on left   COLONOSCOPY     COLONOSCOPY WITH PROPOFOL N/A 04/25/2017   Procedure: COLONOSCOPY WITH PROPOFOL;  Surgeon: [name], MD;  Location: [place];  Service: Endoscopy;  Laterality: N/A;  diabetic-oral med   DILATION AND CURETTAGE OF UTERUS     ESOPHAGOGASTRODUODENOSCOPY (EGD) WITH PROPOFOL N/A 01/10/2021   Procedure: ESOPHAGOGASTRODUODENOSCOPY (EGD) WITH PROPOFOL;  Surgeon: Virgel Manifold, MD;  Location: [place];  Service: Endoscopy;  Laterality: N/A;  Diabetic   EXTERNAL EAR SURGERY Left    x2   POLYPECTOMY  04/25/2017   Procedure: POLYPECTOMY INTESTINAL;  Surgeon: [name], MD;  Location: [facility];  Service: Endoscopy;;   SPINE SURGERY     herniated disc   TUBAL LIGATION      Family History  Problem Relation Age of Onset   Emphysema Mother    Anxiety disorder Mother    Stroke Father    Throat cancer Father    Lung cancer Maternal Grandmother    Lung cancer Maternal Grandfather    Hypertension Daughter    Diabetes Daughter    Multiple sclerosis Daughter    Bipolar disorder Daughter  Cervical cancer Daughter    Bipolar disorder Daughter    Drug abuse Daughter    Lung cancer Maternal Aunt    Lung cancer Maternal Uncle     Social History   Tobacco Use   Smoking status: Every Day    Packs/day: 1.00    Years: 41.00    Total pack years: 41.00    Types: Cigarettes    Start date: 05/19/1977   Smokeless tobacco: Never  Substance Use  Topics   Alcohol use: No    Alcohol/week: 0.0 standard drinks of alcohol     Current Outpatient Medications:    budesonide-formoterol (SYMBICORT) 160-4.5 MCG/ACT inhaler, Inhale 2 puffs into the lungs in the morning and at bedtime., Disp: 1 each, Rfl: 5   carbidopa-levodopa (SINEMET IR) 25-100 MG tablet, Take 1.5 tablets by mouth 3 (three) times daily., Disp: , Rfl:    cetirizine (ZYRTEC) 10 MG tablet, Take 10 mg by mouth daily as needed for allergies., Disp: , Rfl:    colchicine 0.6 MG tablet, TAKE 1 TABLET BY MOUTH ONCE DAILY, Disp: 30 tablet, Rfl: 0   cyclobenzaprine (FLEXERIL) 10 MG tablet, Take 10 mg by mouth at bedtime., Disp: , Rfl:    DULoxetine (CYMBALTA) 60 MG capsule, Take 1 capsule (60 mg total) by mouth daily., Disp: 90 capsule, Rfl: 0   Empagliflozin-metFORMIN HCl (SYNJARDY) 12.5-500 MG TABS, Take 1 tablet by mouth 2 (two) times daily., Disp: 180 tablet, Rfl: 1   fluticasone (FLONASE) 50 MCG/ACT nasal spray, Place 2 sprays into both nostrils daily., Disp: 48 g, Rfl: 0   furosemide (LASIX) 40 MG tablet, Take 1 tablet (40 mg total) by mouth daily as needed., Disp: 30 tablet, Rfl: 2   gabapentin (NEURONTIN) 600 MG tablet, Take 1,200 mg by mouth 3 (three) times daily., Disp: , Rfl:    hydrOXYzine (ATARAX) 25 MG tablet, Take 25 mg by mouth 2 (two) times daily., Disp: , Rfl:    ipratropium (ATROVENT) 0.06 % nasal spray, Place 2 sprays into both nostrils 4 (four) times daily., Disp: 45 mL, Rfl: 0   ipratropium-albuterol (DUONEB) 0.5-2.5 (3) MG/3ML SOLN, Inhale 3 mLs into the lungs every 6 (six) hours as needed., Disp: 360 mL, Rfl: 3   levocetirizine (XYZAL) 5 MG tablet, TAKE 1 TABLET BY MOUTH ONCE EVERY EVENING, Disp: 90 tablet, Rfl: 1   lidocaine (LIDODERM) 5 %, 1 patch every 12 (twelve) hours as needed., Disp: , Rfl:    lubiprostone (AMITIZA) 24 MCG capsule, Take by mouth 2 (two) times daily as needed., Disp: , Rfl:    methylPREDNISolone (MEDROL DOSEPAK) 4 MG TBPK tablet, See admin  instructions., Disp: , Rfl:    montelukast (SINGULAIR) 10 MG tablet, Take 1 tablet by mouth at bedtime., Disp: , Rfl:    morphine (MS CONTIN) 30 MG 12 hr tablet, Take 30 mg by mouth every 12 (twelve) hours., Disp: , Rfl:    morphine (MSIR) 15 MG tablet, Take 15 mg by mouth., Disp: , Rfl:    omeprazole (PRILOSEC) 40 MG capsule, Take 1 capsule (40 mg total) by mouth daily., Disp: 90 capsule, Rfl: 0   Oxycodone HCl 10 MG TABS, Take 10 mg by mouth 4 (four) times daily as needed., Disp: , Rfl:    promethazine (PHENERGAN) 25 MG tablet, Take 25 mg by mouth daily as needed., Disp: , Rfl:    QUEtiapine (SEROQUEL) 25 MG tablet, Take 1 tablet (25 mg total) by mouth at bedtime., Disp: 90 tablet, Rfl: 0   rizatriptan (  MAXALT-MLT) 10 MG disintegrating tablet, Take by mouth., Disp: , Rfl:    rosuvastatin (CRESTOR) 5 MG tablet, Take 1 tablet (5 mg total) by mouth at bedtime., Disp: 90 tablet, Rfl: 1   theophylline (UNIPHYL) 400 MG 24 hr tablet, Take 1 tablet by mouth daily. , Disp: , Rfl:    tiotropium (SPIRIVA) 18 MCG inhalation capsule, Place 1 capsule into inhaler and inhale daily. pm, Disp: , Rfl:    tiZANidine (ZANAFLEX) 4 MG tablet, Take 1 tablet (4 mg total) by mouth every 6 (six) hours as needed for muscle spasms., Disp: 20 tablet, Rfl: 0   VENTOLIN HFA 108 (90 Base) MCG/ACT inhaler, INHALE 1 PUFF BY MOUTH AS NEEDED, Disp: 18 g, Rfl: 0  Allergies  Allergen Reactions   Augmentin [Amoxicillin-Pot Clavulanate] Diarrhea   Penicillins Itching    I personally reviewed active problem list, medication list, allergies, family history, social history, health maintenance with the patient/caregiver today.   ROS  ***  Objective  There were no vitals filed for this visit.  There is no height or weight on file to calculate BMI.  Physical Exam ***  Recent Results (from the past 2160 hour(s))  POCT HgB A1C     Status: Abnormal   Collection Time: 12/27/21  2:44 PM  Result Value Ref Range   Hemoglobin  A1C 6.2 (A) 4.0 - 5.6 %   HbA1c POC (<> result, manual entry)     HbA1c, POC (prediabetic range)     HbA1c, POC (controlled diabetic range)      PHQ2/9:    01/31/2022    9:20 AM 01/16/2022   11:05 AM 12/27/2021    2:40 PM 10/25/2021    1:52 PM 09/14/2021    9:21 AM  Depression screen PHQ 2/9  Decreased Interest '3 3 3 2 '$ 0  Down, Depressed, Hopeless '3 3 3 2 '$ 0  PHQ - 2 Score '6 6 6 4 '$ 0  Altered sleeping '3 3 3 2 '$ 0  Tired, decreased energy '3 3 3 2 '$ 0  Change in appetite '3 3 3 1 '$ 0  Feeling bad or failure about yourself  0 2 3 0 0  Trouble concentrating '3 3 3 3 '$ 0  Moving slowly or fidgety/restless 0 2 0 3 0  Suicidal thoughts 0 0 0 0 0  PHQ-9 Score '18 22 21 15 '$ 0  Difficult doing work/chores Very difficult Very difficult Very difficult Somewhat difficult Not difficult at all    phq 9 is {gen pos [id number]}   Fall Risk:    01/31/2022    9:19 AM 01/16/2022   11:04 AM 12/27/2021    2:39 PM 10/25/2021    1:23 PM 09/14/2021    9:20 AM  Fall Risk   Falls in the past year? '1 1 1 1 1  '$ Number falls in past yr: '1 1 1 1 '$ 0  Injury with Fall? 1 0 0 1 1  Risk for fall due to : Impaired balance/gait;History of fall(s) History of fall(s) Impaired balance/gait;Other (Comment) History of fall(s) History of fall(s)  Risk for fall due to: Comment   Parkinson's- Loss of balance    Follow up Falls prevention discussed;Education provided;Falls evaluation completed Falls prevention discussed;Falls evaluation completed;Education provided Falls prevention discussed Falls evaluation completed;Education provided;Falls prevention discussed Falls evaluation completed      Functional Status Survey:      Assessment & Plan  *** There are no diagnoses linked to this encounter.

## 2022-02-07 ENCOUNTER — Ambulatory Visit: Payer: Medicare Other | Admitting: Family Medicine

## 2022-02-13 NOTE — Progress Notes (Deleted)
Name: Tricia [name]   MRN: [id number]    DOB: Jul 07, 1960   Date:02/13/2022       Progress Note  Subjective  Chief Complaint  Follow Up  HPI  *** Patient Active Problem List   Diagnosis Date Noted   Nausea 09/08/2021   Hypoalbuminemia due to protein-calorie malnutrition ([place]) 09/08/2021   Lumbar herniated disc 07/11/2021   Calculus of gallbladder without cholecystitis without obstruction 07/11/2021   Senile purpura ([place]) 06/05/2021   Atherosclerosis of aorta ([place]) 06/05/2021   Parkinson's disease 06/05/2021   Chronic kidney disease (CKD) stage G3a/A2, moderately decreased glomerular filtration rate (GFR) between 45-59 mL/min/1.73 square meter and albuminuria creatinine ratio between 30-299 mg/g ([place]) 06/05/2021   Esophageal dysphagia    Gastric erythema    Columnar-lined esophagus    Moderate malnutrition ([place]) 09/26/2020   Centrilobular emphysema ([place]) 03/30/2020   History of prolonged Q-T interval on ECG 11/18/2019   GAD (generalized anxiety disorder) 10/13/2019   MDD (major depressive disorder), recurrent episode, moderate ([place]) 10/13/2019   At risk for long QT syndrome 10/13/2019   Nonrheumatic mitral valve regurgitation 05/04/2019   Benign neoplasm of descending colon    Polyp of sigmoid colon    Polyneuropathy 10/21/2014   Chronic venous insufficiency 10/05/2014   Bilateral leg edema 08/16/2014   Major depression in partial remission ([place]) 08/16/2014   Acid reflux 08/16/2014   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   Asthma, moderate persistent 08/16/2014   Carpal tunnel syndrome 08/16/2014   Cervical pain 08/16/2014   CAFL (chronic airflow limitation) ([place]) 08/16/2014   Type 2 diabetes mellitus ([place]) 08/16/2014   Diabetes mellitus type 2, insulin dependent ([place]) 08/16/2014   Dyslipidemia 08/16/2014   Tobacco use disorder 08/16/2014   Essential (primary) hypertension 08/16/2014   Benign neoplasm of stomach 08/16/2014   Gout 08/16/2014   Mixed  hyperlipidemia 08/16/2014   Low back pain 08/16/2014   Lumbar radiculopathy 08/16/2014   Headache, migraine 08/16/2014   Arthralgia of multiple joints 08/16/2014   Avitaminosis D 08/16/2014   Primary osteoarthritis of both knees 06/22/2014   Benign essential tremor 10/02/2013   Cervical dystonia 10/02/2013   Chronic diastolic heart failure ([place]) 11/16/2012   Chronic pain 11/13/2012    Past Surgical History:  Procedure Laterality Date   CARPAL TUNNEL RELEASE Bilateral    x2 right, 1x on left   COLONOSCOPY     COLONOSCOPY WITH PROPOFOL N/A 04/25/2017   Procedure: COLONOSCOPY WITH PROPOFOL;  Surgeon: [name], MD;  Location: [place];  Service: Endoscopy;  Laterality: N/A;  diabetic-oral med   DILATION AND CURETTAGE OF UTERUS     ESOPHAGOGASTRODUODENOSCOPY (EGD) WITH PROPOFOL N/A 01/10/2021   Procedure: ESOPHAGOGASTRODUODENOSCOPY (EGD) WITH PROPOFOL;  Surgeon: Virgel Manifold, MD;  Location: [place];  Service: Endoscopy;  Laterality: N/A;  Diabetic   EXTERNAL EAR SURGERY Left    x2   POLYPECTOMY  04/25/2017   Procedure: POLYPECTOMY INTESTINAL;  Surgeon: [name], MD;  Location: [facility];  Service: Endoscopy;;   SPINE SURGERY     herniated disc   TUBAL LIGATION      Family History  Problem Relation Age of Onset   Emphysema Mother    Anxiety disorder Mother    Stroke Father    Throat cancer Father    Lung cancer Maternal Grandmother    Lung cancer Maternal Grandfather    Hypertension Daughter    Diabetes Daughter    Multiple sclerosis Daughter    Bipolar disorder Daughter  Cervical cancer Daughter    Bipolar disorder Daughter    Drug abuse Daughter    Lung cancer Maternal Aunt    Lung cancer Maternal Uncle     Social History   Tobacco Use   Smoking status: Every Day    Packs/day: 1.00    Years: 41.00    Total pack years: 41.00    Types: Cigarettes    Start date: 05/19/1977   Smokeless tobacco: Never  Substance Use  Topics   Alcohol use: No    Alcohol/week: 0.0 standard drinks of alcohol     Current Outpatient Medications:    budesonide-formoterol (SYMBICORT) 160-4.5 MCG/ACT inhaler, Inhale 2 puffs into the lungs in the morning and at bedtime., Disp: 1 each, Rfl: 5   carbidopa-levodopa (SINEMET IR) 25-100 MG tablet, Take 1.5 tablets by mouth 3 (three) times daily., Disp: , Rfl:    cetirizine (ZYRTEC) 10 MG tablet, Take 10 mg by mouth daily as needed for allergies., Disp: , Rfl:    colchicine 0.6 MG tablet, TAKE 1 TABLET BY MOUTH ONCE DAILY, Disp: 30 tablet, Rfl: 0   cyclobenzaprine (FLEXERIL) 10 MG tablet, Take 10 mg by mouth at bedtime., Disp: , Rfl:    DULoxetine (CYMBALTA) 60 MG capsule, Take 1 capsule (60 mg total) by mouth daily., Disp: 90 capsule, Rfl: 0   Empagliflozin-metFORMIN HCl (SYNJARDY) 12.5-500 MG TABS, Take 1 tablet by mouth 2 (two) times daily., Disp: 180 tablet, Rfl: 1   fluticasone (FLONASE) 50 MCG/ACT nasal spray, Place 2 sprays into both nostrils daily., Disp: 48 g, Rfl: 0   furosemide (LASIX) 40 MG tablet, Take 1 tablet (40 mg total) by mouth daily as needed., Disp: 30 tablet, Rfl: 2   gabapentin (NEURONTIN) 600 MG tablet, Take 1,200 mg by mouth 3 (three) times daily., Disp: , Rfl:    hydrOXYzine (ATARAX) 25 MG tablet, Take 25 mg by mouth 2 (two) times daily., Disp: , Rfl:    ipratropium (ATROVENT) 0.06 % nasal spray, Place 2 sprays into both nostrils 4 (four) times daily., Disp: 45 mL, Rfl: 0   ipratropium-albuterol (DUONEB) 0.5-2.5 (3) MG/3ML SOLN, Inhale 3 mLs into the lungs every 6 (six) hours as needed., Disp: 360 mL, Rfl: 3   levocetirizine (XYZAL) 5 MG tablet, TAKE 1 TABLET BY MOUTH ONCE EVERY EVENING, Disp: 90 tablet, Rfl: 1   lidocaine (LIDODERM) 5 %, 1 patch every 12 (twelve) hours as needed., Disp: , Rfl:    lubiprostone (AMITIZA) 24 MCG capsule, Take by mouth 2 (two) times daily as needed., Disp: , Rfl:    methylPREDNISolone (MEDROL DOSEPAK) 4 MG TBPK tablet, See admin  instructions., Disp: , Rfl:    montelukast (SINGULAIR) 10 MG tablet, Take 1 tablet by mouth at bedtime., Disp: , Rfl:    morphine (MS CONTIN) 30 MG 12 hr tablet, Take 30 mg by mouth every 12 (twelve) hours., Disp: , Rfl:    morphine (MSIR) 15 MG tablet, Take 15 mg by mouth., Disp: , Rfl:    omeprazole (PRILOSEC) 40 MG capsule, Take 1 capsule (40 mg total) by mouth daily., Disp: 90 capsule, Rfl: 0   Oxycodone HCl 10 MG TABS, Take 10 mg by mouth 4 (four) times daily as needed., Disp: , Rfl:    promethazine (PHENERGAN) 25 MG tablet, Take 25 mg by mouth daily as needed., Disp: , Rfl:    QUEtiapine (SEROQUEL) 25 MG tablet, Take 1 tablet (25 mg total) by mouth at bedtime., Disp: 90 tablet, Rfl: 0   rizatriptan (  MAXALT-MLT) 10 MG disintegrating tablet, Take by mouth., Disp: , Rfl:    rosuvastatin (CRESTOR) 5 MG tablet, Take 1 tablet (5 mg total) by mouth at bedtime., Disp: 90 tablet, Rfl: 1   theophylline (UNIPHYL) 400 MG 24 hr tablet, Take 1 tablet by mouth daily. , Disp: , Rfl:    tiotropium (SPIRIVA) 18 MCG inhalation capsule, Place 1 capsule into inhaler and inhale daily. pm, Disp: , Rfl:    tiZANidine (ZANAFLEX) 4 MG tablet, Take 1 tablet (4 mg total) by mouth every 6 (six) hours as needed for muscle spasms., Disp: 20 tablet, Rfl: 0   VENTOLIN HFA 108 (90 Base) MCG/ACT inhaler, INHALE 1 PUFF BY MOUTH AS NEEDED, Disp: 18 g, Rfl: 0  Allergies  Allergen Reactions   Augmentin [Amoxicillin-Pot Clavulanate] Diarrhea   Penicillins Itching    I personally reviewed active problem list, medication list, allergies, family history, social history, health maintenance with the patient/caregiver today.   ROS  ***  Objective  There were no vitals filed for this visit.  There is no height or weight on file to calculate BMI.  Physical Exam ***  Recent Results (from the past 2160 hour(s))  POCT HgB A1C     Status: Abnormal   Collection Time: 12/27/21  2:44 PM  Result Value Ref Range   Hemoglobin  A1C 6.2 (A) 4.0 - 5.6 %   HbA1c POC (<> result, manual entry)     HbA1c, POC (prediabetic range)     HbA1c, POC (controlled diabetic range)      PHQ2/9:    01/31/2022    9:20 AM 01/16/2022   11:05 AM 12/27/2021    2:40 PM 10/25/2021    1:52 PM 09/14/2021    9:21 AM  Depression screen PHQ 2/9  Decreased Interest '3 3 3 2 '$ 0  Down, Depressed, Hopeless '3 3 3 2 '$ 0  PHQ - 2 Score '6 6 6 4 '$ 0  Altered sleeping '3 3 3 2 '$ 0  Tired, decreased energy '3 3 3 2 '$ 0  Change in appetite '3 3 3 1 '$ 0  Feeling bad or failure about yourself  0 2 3 0 0  Trouble concentrating '3 3 3 3 '$ 0  Moving slowly or fidgety/restless 0 2 0 3 0  Suicidal thoughts 0 0 0 0 0  PHQ-9 Score '18 22 21 15 '$ 0  Difficult doing work/chores Very difficult Very difficult Very difficult Somewhat difficult Not difficult at all    phq 9 is {gen pos [id number]}   Fall Risk:    01/31/2022    9:19 AM 01/16/2022   11:04 AM 12/27/2021    2:39 PM 10/25/2021    1:23 PM 09/14/2021    9:20 AM  Fall Risk   Falls in the past year? '1 1 1 1 1  '$ Number falls in past yr: '1 1 1 1 '$ 0  Injury with Fall? 1 0 0 1 1  Risk for fall due to : Impaired balance/gait;History of fall(s) History of fall(s) Impaired balance/gait;Other (Comment) History of fall(s) History of fall(s)  Risk for fall due to: Comment   Parkinson's- Loss of balance    Follow up Falls prevention discussed;Education provided;Falls evaluation completed Falls prevention discussed;Falls evaluation completed;Education provided Falls prevention discussed Falls evaluation completed;Education provided;Falls prevention discussed Falls evaluation completed      Functional Status Survey:      Assessment & Plan  *** There are no diagnoses linked to this encounter.

## 2022-02-14 ENCOUNTER — Ambulatory Visit: Payer: Medicare Other | Admitting: Family Medicine

## 2022-02-21 ENCOUNTER — Encounter: Payer: Self-pay | Admitting: Physician Assistant

## 2022-02-21 ENCOUNTER — Ambulatory Visit (INDEPENDENT_AMBULATORY_CARE_PROVIDER_SITE_OTHER): Payer: Medicare Other | Admitting: Physician Assistant

## 2022-02-21 VITALS — BP 132/72 | HR 65 | Temp 98.0°F | Resp 16 | Ht 68.0 in | Wt 125.3 lb

## 2022-02-21 DIAGNOSIS — S81801A Unspecified open wound, right lower leg, initial encounter: Secondary | ICD-10-CM | POA: Insufficient documentation

## 2022-02-21 DIAGNOSIS — L89151 Pressure ulcer of sacral region, stage 1: Secondary | ICD-10-CM | POA: Insufficient documentation

## 2022-02-21 DIAGNOSIS — S31000A Unspecified open wound of lower back and pelvis without penetration into retroperitoneum, initial encounter: Secondary | ICD-10-CM | POA: Insufficient documentation

## 2022-02-21 DIAGNOSIS — J449 Chronic obstructive pulmonary disease, unspecified: Secondary | ICD-10-CM | POA: Diagnosis not present

## 2022-02-21 MED ORDER — SILVER SULFADIAZINE 1 % EX CREA: 1.0000 | TOPICAL_CREAM | Freq: Every day | CUTANEOUS | 0 refills | Status: DC

## 2022-02-21 NOTE — Patient Instructions (Addendum)
I have placed a referral to wound care for your skin concerns  Please be on the lookout for a phone call from our referral team to set up that apt.  I am sending you home with an antibacterial ointment called Silvadine - please only apply this to the wound in your buttocks area You can keep this covered with a non-stick gauze and medical tape as long as the skin around the wound is not hurt or damaged by the tape.  Do not pick or attempt to manipulate the areas on the back of your right leg - Wound Care will evaluate these when you get in to see them  Please make sure you are eating regularly and drinking plenty of water. Wound healing is dramatically improved by eating enough protein and vegetables every day.  Please come back and follow up in about 2 weeks with Dr. [name] to monitor your wounds and make sure you are getting better.

## 2022-02-21 NOTE — Progress Notes (Signed)
Acute Office Visit   [name]   DOB: [birth year]   62 y.o. Female  MRN: [id number] Visit Date: 02/21/2022  Today's healthcare provider: Dani Gobble Zailyn Thoennes, PA-C  Introduced myself to the patient as a Journalist, newspaper and provided education on APPs in clinical practice.    Chief Complaint  Patient presents with   Blister    Right leg unsure if its a lesion or blister onset for weeks, looks worse, dried blood present.    Subjective    HPI HPI     Blister    Additional comments: Right leg unsure if its a lesion or blister onset for weeks, looks worse, dried blood present.       Last edited by Almon Register, PA-C on 02/21/2022 11:23 AM.       Reports she noticed soreness along her right leg about 2 weeks ago and found a blister along the right thigh She is unaware of any trauma, bites or injuries to the areas She states her balance has been impaired lately    She is also concerned for a "bed sore" on her at the top of her gluteal cleft  She states this just "appeared" about a week ago  She denies being bed-bound or immobilized for prolonged periods of time She reports chronic stress urinary incontinence and has developed diarrhea this week   She reports she thinks she weighed about 134 lbs at her previous visit and has now lost about 10 lbs without trying    Medications: Outpatient Medications Prior to Visit  Medication Sig   budesonide-formoterol (SYMBICORT) 160-4.5 MCG/ACT inhaler Inhale 2 puffs into the lungs in the morning and at bedtime.   cetirizine (ZYRTEC) 10 MG tablet Take 10 mg by mouth daily as needed for allergies.   colchicine 0.6 MG tablet TAKE 1 TABLET BY MOUTH ONCE DAILY   cyclobenzaprine (FLEXERIL) 10 MG tablet Take 10 mg by mouth at bedtime.   DULoxetine (CYMBALTA) 60 MG capsule Take 1 capsule (60 mg total) by mouth daily.   Empagliflozin-metFORMIN HCl (SYNJARDY) 12.5-500 MG TABS Take 1 tablet by mouth 2 (two) times daily.   fluticasone  (FLONASE) 50 MCG/ACT nasal spray Place 2 sprays into both nostrils daily.   furosemide (LASIX) 40 MG tablet Take 1 tablet (40 mg total) by mouth daily as needed.   gabapentin (NEURONTIN) 600 MG tablet Take 1,200 mg by mouth 3 (three) times daily.   hydrOXYzine (ATARAX) 25 MG tablet Take 25 mg by mouth 2 (two) times daily.   ipratropium (ATROVENT) 0.06 % nasal spray Place 2 sprays into both nostrils 4 (four) times daily.   ipratropium-albuterol (DUONEB) 0.5-2.5 (3) MG/3ML SOLN Inhale 3 mLs into the lungs every 6 (six) hours as needed.   levocetirizine (XYZAL) 5 MG tablet TAKE 1 TABLET BY MOUTH ONCE EVERY EVENING   lidocaine (LIDODERM) 5 % 1 patch every 12 (twelve) hours as needed.   lubiprostone (AMITIZA) 24 MCG capsule Take by mouth 2 (two) times daily as needed.   methylPREDNISolone (MEDROL DOSEPAK) 4 MG TBPK tablet See admin instructions.   montelukast (SINGULAIR) 10 MG tablet Take 1 tablet by mouth at bedtime.   morphine (MS CONTIN) 30 MG 12 hr tablet Take 30 mg by mouth every 12 (twelve) hours.   morphine (MSIR) 15 MG tablet Take 15 mg by mouth.   omeprazole (PRILOSEC) 40 MG capsule Take 1 capsule (40 mg total) by mouth daily.   Oxycodone HCl  10 MG TABS Take 10 mg by mouth 4 (four) times daily as needed.   promethazine (PHENERGAN) 25 MG tablet Take 25 mg by mouth daily as needed.   QUEtiapine (SEROQUEL) 25 MG tablet Take 1 tablet (25 mg total) by mouth at bedtime.   rizatriptan (MAXALT-MLT) 10 MG disintegrating tablet Take by mouth.   rosuvastatin (CRESTOR) 5 MG tablet Take 1 tablet (5 mg total) by mouth at bedtime.   theophylline (UNIPHYL) 400 MG 24 hr tablet Take 1 tablet by mouth daily.    tiotropium (SPIRIVA) 18 MCG inhalation capsule Place 1 capsule into inhaler and inhale daily. pm   tiZANidine (ZANAFLEX) 4 MG tablet Take 1 tablet (4 mg total) by mouth every 6 (six) hours as needed for muscle spasms.   VENTOLIN HFA 108 (90 Base) MCG/ACT inhaler INHALE 1 PUFF BY MOUTH AS NEEDED    carbidopa-levodopa (SINEMET IR) 25-100 MG tablet Take 1.5 tablets by mouth 3 (three) times daily.   No facility-administered medications prior to visit.    Review of Systems  Constitutional:  Positive for appetite change and unexpected weight change.  Gastrointestinal:  Positive for diarrhea.  Skin:  Positive for wound.       Multiple wounds on her right leg and pressure wound at top of gluteal cleft.   Psychiatric/Behavioral:  Positive for dysphoric mood (several close deaths in the past year).        Objective    BP 132/72   Pulse 65   Temp 98 F (36.7 C) (Oral)   Resp 16   Ht '5\' 8"'$  (1.727 m)   Wt 125 lb 4.8 oz (56.8 kg)   SpO2 96%   BMI 19.05 kg/m    Physical Exam Vitals reviewed.  Constitutional:      General: She is awake.     Appearance: She is well-developed and underweight. She is ill-appearing.     Comments: Patient appears disheveled during exam   HENT:     Head: Normocephalic and atraumatic.  Pulmonary:     Effort: Pulmonary effort is normal.  Skin:    General: Skin is dry.     Findings: Wound present.     Comments: Skin appears dry and flaking along legs and lower back during exam  The following wounds were noted on exam- in order of pictures provided to PE  One unstagable wound present to posterior aspect of right knee with eschar covering entire aspect see images below  What appears to be two wounds to posterior thigh - seem to be stage 1 on exam  Stage 1 pressure ulcer to sacral area   Neurological:     General: No focal deficit present.     Mental Status: She is alert and oriented to person, place, and time.  Psychiatric:        Attention and Perception: Attention and perception normal.        Mood and Affect: Mood normal.        Speech: Speech normal.        Behavior: Behavior normal. Behavior is cooperative.        Thought Content: Thought content normal.        Media Information   Document Information  Photos    02/21/2022 11:34   Attached To:  Office Visit on 02/21/22 with Albena Comes, Dani Gobble, PA-C  Source Information  Langston [place], Dani Gobble, PA-C  [facility]   Document Information  Photos  02/21/2022 11:34  Attached To:  Office Visit on 02/21/22 with Leeanne Butters, Dani Gobble, PA-C  Source Information  Almadelia Looman, Dani Gobble, PA-C  [facility] [employer] Information   Document Information  Photos    02/21/2022 11:34  Attached To:  Office Visit on 02/21/22 with Ayomikun Starling, Dani Gobble, PA-C  Source Information  Syretta Kochel, Dani Gobble, PA-C  [facility] [employer] Information   Document Information  Photos    02/21/2022 11:34  Attached To:  Office Visit on 02/21/22 with Riana Tessmer, Dani Gobble, PA-C  Source Information  Michaeljohn Biss, Dani Gobble, PA-C  [facility] [employer] Information   Document Information  Photos    02/21/2022 11:35  Attached To:  Office Visit on 02/21/22 with Floella Ensz, Dani Gobble, PA-C  Source Information  Drucilla Cumber, Dani Gobble, PA-C  [facility] [employer] Information   Document Information  Photos    02/21/2022 11:35  Attached To:  Office Visit on 02/21/22 with Tregan Read, Dani Gobble, PA-C  Source Information  Lezli Danek, Dani Gobble, PA-C  [facility] [employer] Information   Document Information  Photos    02/21/2022 11:36  Attached To:  Office Visit on 02/21/22 with Mikeisha Lemonds, Dani Gobble, PA-C  Source Information  Dewel Lotter, Dani Gobble, PA-C  [facility]   No results found for any visits on 02/21/22.  Assessment & Plan           Problem List Items Addressed This Visit       Other   Wound of right leg, initial encounter - Primary    Likely acute but unsure of chronicity or cause, new concern Patient presents with eschar-covered wound that cannot be staged at this time - please see photos in PE for sizing and appearance No drainage or bleeding noted on exam and eschar appears dry and stable to observation Reviewed care measures- instructed her  to refrain from manipulating area or picking as we are not sure how deep wound is - she voiced understanding and agreement Will place referral to wound care for further evaluation and management Follow up in 1-2 weeks for monitoring       Relevant Orders   AMB referral to wound care center   Pressure injury of sacral region, stage 1    Unsure of chronicity but likely acute, new concern Patient is capable of ambulating without assistance and denies recent bed bound or immobility/ surgery  Sacral area and hips are very prominent to palpation as patient seems almost underweight. I am concerned for malnutrition as contributing factor for her wounds at this time  She reports several losses of close family members and signs of Depression today which may account for her reduced mobility and wounds Reviewed wound care with her - will provide Silvadene cream to assist with infection prevention and healing, recommend using nonstick gauze and medical tape to keep area covered if desired to prevent further skin breakdown Will place referral to Wound care for this and right leg wounds as well Follow up in 1-2 weeks for monitoring       Relevant Medications   silver sulfADIAZINE (SILVADENE) 1 % cream   Other Relevant Orders   AMB referral to wound care center     Return in about 2 weeks (around 03/07/2022) for Wound monitoring.   I, Kevork Joyce E Jonai Weyland, PA-C, have reviewed all documentation for  this visit. The documentation on 02/23/22 for the exam, diagnosis, procedures, and orders are all accurate and complete.   Talitha Givens, MHS, PA-C [facility]

## 2022-02-23 NOTE — Assessment & Plan Note (Signed)
Likely acute but unsure of chronicity or cause, new concern Patient presents with eschar-covered wound that cannot be staged at this time - please see photos in PE for sizing and appearance No drainage or bleeding noted on exam and eschar appears dry and stable to observation Reviewed care measures- instructed her to refrain from manipulating area or picking as we are not sure how deep wound is - she voiced understanding and agreement Will place referral to wound care for further evaluation and management Follow up in 1-2 weeks for monitoring

## 2022-02-23 NOTE — Assessment & Plan Note (Addendum)
Unsure of chronicity but likely acute, new concern Patient is capable of ambulating without assistance and denies recent bed bound or immobility/ surgery  Sacral area and hips are very prominent to palpation as patient seems almost underweight. I am concerned for malnutrition as contributing factor for her wounds at this time  She reports several losses of close family members and signs of Depression today which may account for her reduced mobility and wounds Reviewed wound care with her - will provide Silvadene cream to assist with infection prevention and healing, recommend using nonstick gauze and medical tape to keep area covered if desired to prevent further skin breakdown Will place referral to Wound care for this and right leg wounds as well Follow up in 1-2 weeks for monitoring

## 2022-03-02 ENCOUNTER — Other Ambulatory Visit: Payer: Self-pay | Admitting: Nurse Practitioner

## 2022-03-02 DIAGNOSIS — J189 Pneumonia, unspecified organism: Secondary | ICD-10-CM

## 2022-03-02 NOTE — Telephone Encounter (Signed)
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, music accident, initial encounter   [facility] [place], Drue Stager, MD   2 months ago Type 2 diabetes mellitus with peripheral neuropathy [facility])   [facility] [place], Drue Stager, MD   4 months ago Type 2 diabetes mellitus with peripheral neuropathy [facility])   [facility] [name], MD       Future Appointments             In 5 days [name], MD [facility], [place]   In 2 months [name], MD [facility], [place]   In 11 months  [facility], [facility]

## 2022-03-06 NOTE — Progress Notes (Deleted)
Name: Tricia [name]   MRN: [id number]    DOB: 17-Feb-1961   Date:03/06/2022       Progress Note  Subjective  Chief Complaint  Follow Up  HPI  Protein malnutrition: weight loss, without dieting She went to see GI and had EGD 11/22 , but she refused colonoscopy. She has not scheduled her mammogram yet.  She had CT abdomen that showed fatty liver , admitted for CAP with sepsis in July. She has a follow up with Endo and needs to have pap smear - but does not want to have it done today . Weight is stable since last visit   Parkinson's: she was diagnosed in 2021 by Dr. Trena Platt PA  with right hemibody parkinson's . She is taking Sinemet IR. She states still has balanced problems and tremors of both hands . She did not get PT , she still has balanced problems but not falling daily   Grieving: her 72 yo daughter died yesterday, she was at home, alive when EMS arrived but died before the ambulance left to the hospital    Diabetes type II ,currently Synjardi   A1C today is 6.2 %  She has dyslipidemia, and microalbuminuria.  She denies polyphagia, polydipsia or polyuria.  She has associated dyslipidemia  and CKI , albuminuria on SGL-2 agonist .BP is low now and off ACE/ARB also off lantus due to weight loss . Continue current regiment    Chronic pain: sees pain clinic, currently going to [facility], she states pain is still up and down, worse pain is on lumbar spine. Unchanged   Chronic nausea/gastritis/barrett's she has nausea, due for follow up with GI, advised to ask them for refill of promethazine, she states Zofran does not work well for her ,she has not gained weight.   Gout: she has flares intermittently, takes colchicine prn    Asthma and COPD and chronic airflow limitation/centrolobular emphysema :  under the care of Dr. Raul Del. She had CT repeat CT 08/2021 , found to have ground glass changes and will follow up with him soon, she is taking medication given by pulmonologist     08/2021  1. Lung-RADS 0, incomplete. Additional lung cancer screening CT images/or comparison to prior chest CT examinations is needed.   Extensive mid and lower lung zone predominant peribronchovascular ground-glass, nodularity and endobronchial debris, new from prior and most indicative of an infectious bronchiolitis. Recommend repeat low-dose lung cancer screening CT in 4-6 weeks, after appropriate therapy.   These results will be called to the ordering clinician or representative by the Radiologist Assistant, and communication documented in the PACS or [employer]. 2. Marked hepatic steatosis. 3. Aortic atherosclerosis (ICD10-I70.0). Coronary artery calcification. 4.  Emphysema (ICD10-J43.9).  Thyroid nodule: had US done by Dr. Manfred Shirts at [facility] clinic 06/2019, she states they decided not to do a biopsy , repeat US in 22 did not recommend repeat biopsy , small lymphonodo on left side, seems reactive, she has a follow up scheduled with Endo next month    Major Depression: chronic and recurrent, phq 9 is high, she was under the care of Dr. [name] but lost to follow up . She recently lost her boyfriend of 20 plus years, he left her a lot of debt and not money to pay for it, she finally sold her house and moved in with her daughter and son in [place]. Her daughter died at age 41 , yesterday and she is upset    CHF: doing well  at this time,  she has orthopnea - she uses two pillows . Dr. Ubaldo Glassing retired, but needs to follow up with [facility] cardiology, she is on lasix  and SGL2 agonist since 08/22. Tolerating it well. She is not on ARB or ACE due to low bp. She is on statin therapy and tolerating it well, reviewed last labs   Echo from 06/05/2019  NORMAL LEFT VENTRICULAR SYSTOLIC FUNCTION  NORMAL RIGHT VENTRICULAR SYSTOLIC FUNCTION  MILD VALVULAR REGURGITATION (See above)  NO VALVULAR STENOSIS  Closest EF: >55% (Estimated)  Aortic: MILD AR  Mitral: MILD MR  Tricuspid: MILD TR   Echo showed enlarged left and right atrium   Atherosclerosis of Aorta: discussed CT , she is now taking statin therapy and last LDL has been at goal with medication  Senile purpura: stable and reassurance given   Patient Active Problem List   Diagnosis Date Noted   Sacral wound 02/21/2022   Wound of right leg, initial encounter 02/21/2022   Pressure injury of sacral region, stage 1 02/21/2022   Nausea 09/08/2021   Hypoalbuminemia due to protein-calorie malnutrition ([place]) 09/08/2021   Lumbar herniated disc 07/11/2021   Calculus of gallbladder without cholecystitis without obstruction 07/11/2021   Senile purpura ([place]) 06/05/2021   Atherosclerosis of aorta ([place]) 06/05/2021   Parkinson's disease 06/05/2021   Chronic kidney disease (CKD) stage G3a/A2, moderately decreased glomerular filtration rate (GFR) between 45-59 mL/min/1.73 square meter and albuminuria creatinine ratio between 30-299 mg/g ([place]) 06/05/2021   Esophageal dysphagia    Gastric erythema    Columnar-lined esophagus    Moderate malnutrition ([place]) 09/26/2020   Centrilobular emphysema ([place]) 03/30/2020   History of prolonged Q-T interval on ECG 11/18/2019   GAD (generalized anxiety disorder) 10/13/2019   MDD (major depressive disorder), recurrent episode, moderate ([place]) 10/13/2019   At risk for long QT syndrome 10/13/2019   Nonrheumatic mitral valve regurgitation 05/04/2019   Benign neoplasm of descending colon    Polyp of sigmoid colon    Polyneuropathy 10/21/2014   Chronic venous insufficiency 10/05/2014   Bilateral leg edema 08/16/2014   Major depression in partial remission ([place]) 08/16/2014   Acid reflux 08/16/2014   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   Asthma, moderate persistent 08/16/2014   Carpal tunnel syndrome 08/16/2014   Cervical pain 08/16/2014   CAFL (chronic airflow limitation) ([place]) 08/16/2014   Type 2 diabetes mellitus ([place]) 08/16/2014   Diabetes mellitus type 2, insulin dependent ([place])  08/16/2014   Dyslipidemia 08/16/2014   Tobacco use disorder 08/16/2014   Essential (primary) hypertension 08/16/2014   Benign neoplasm of stomach 08/16/2014   Gout 08/16/2014   Mixed hyperlipidemia 08/16/2014   Low back pain 08/16/2014   Lumbar radiculopathy 08/16/2014   Headache, migraine 08/16/2014   Arthralgia of multiple joints 08/16/2014   Avitaminosis D 08/16/2014   Primary osteoarthritis of both knees 06/22/2014   Benign essential tremor 10/02/2013   Cervical dystonia 10/02/2013   Chronic diastolic heart failure ([place]) 11/16/2012   Chronic pain 11/13/2012    Past Surgical History:  Procedure Laterality Date   CARPAL TUNNEL RELEASE Bilateral    x2 right, 1x on left   COLONOSCOPY     COLONOSCOPY WITH PROPOFOL N/A 04/25/2017   Procedure: COLONOSCOPY WITH PROPOFOL;  Surgeon: [name], MD;  Location: [facility];  Service: Endoscopy;  Laterality: N/A;  diabetic-oral med   DILATION AND CURETTAGE OF UTERUS     ESOPHAGOGASTRODUODENOSCOPY (EGD) WITH PROPOFOL N/A 01/10/2021   Procedure: ESOPHAGOGASTRODUODENOSCOPY (EGD) WITH PROPOFOL;  Surgeon:  Virgel Manifold, MD;  Location: [place] [facility];  Service: Endoscopy;  Laterality: N/A;  Diabetic   EXTERNAL EAR SURGERY Left    x2   POLYPECTOMY  04/25/2017   Procedure: POLYPECTOMY INTESTINAL;  Surgeon: [name], MD;  Location: [facility];  Service: Endoscopy;;   SPINE SURGERY     herniated disc   TUBAL LIGATION      Family History  Problem Relation Age of Onset   Emphysema Mother    Anxiety disorder Mother    Stroke Father    Throat cancer Father    Lung cancer Maternal Grandmother    Lung cancer Maternal Grandfather    Hypertension Daughter    Diabetes Daughter    Multiple sclerosis Daughter    Bipolar disorder Daughter    Cervical cancer Daughter    Bipolar disorder Daughter    Drug abuse Daughter    Lung cancer Maternal Aunt    Lung cancer Maternal Uncle     Social History   Tobacco Use    Smoking status: Every Day    Packs/day: 1.00    Years: 41.00    Total pack years: 41.00    Types: Cigarettes    Start date: 05/19/1977   Smokeless tobacco: Never  Substance Use Topics   Alcohol use: No    Alcohol/week: 0.0 standard drinks of alcohol     Current Outpatient Medications:    budesonide-formoterol (SYMBICORT) 160-4.5 MCG/ACT inhaler, INHALE 2 PUFFS TWICE A DAY RINSE MOUTH WITH WATER AFTER EACH USE, Disp: 10.2 g, Rfl: 2   carbidopa-levodopa (SINEMET IR) 25-100 MG tablet, Take 1.5 tablets by mouth 3 (three) times daily., Disp: , Rfl:    cetirizine (ZYRTEC) 10 MG tablet, Take 10 mg by mouth daily as needed for allergies., Disp: , Rfl:    colchicine 0.6 MG tablet, TAKE 1 TABLET BY MOUTH ONCE DAILY, Disp: 30 tablet, Rfl: 0   cyclobenzaprine (FLEXERIL) 10 MG tablet, Take 10 mg by mouth at bedtime., Disp: , Rfl:    DULoxetine (CYMBALTA) 60 MG capsule, Take 1 capsule (60 mg total) by mouth daily., Disp: 90 capsule, Rfl: 0   Empagliflozin-metFORMIN HCl (SYNJARDY) 12.5-500 MG TABS, Take 1 tablet by mouth 2 (two) times daily., Disp: 180 tablet, Rfl: 1   fluticasone (FLONASE) 50 MCG/ACT nasal spray, Place 2 sprays into both nostrils daily., Disp: 48 g, Rfl: 0   furosemide (LASIX) 40 MG tablet, Take 1 tablet (40 mg total) by mouth daily as needed., Disp: 30 tablet, Rfl: 2   gabapentin (NEURONTIN) 600 MG tablet, Take 1,200 mg by mouth 3 (three) times daily., Disp: , Rfl:    hydrOXYzine (ATARAX) 25 MG tablet, Take 25 mg by mouth 2 (two) times daily., Disp: , Rfl:    ipratropium (ATROVENT) 0.06 % nasal spray, Place 2 sprays into both nostrils 4 (four) times daily., Disp: 45 mL, Rfl: 0   ipratropium-albuterol (DUONEB) 0.5-2.5 (3) MG/3ML SOLN, Inhale 3 mLs into the lungs every 6 (six) hours as needed., Disp: 360 mL, Rfl: 3   levocetirizine (XYZAL) 5 MG tablet, TAKE 1 TABLET BY MOUTH ONCE EVERY EVENING, Disp: 90 tablet, Rfl: 1   lidocaine (LIDODERM) 5 %, 1 patch every 12 (twelve) hours as  needed., Disp: , Rfl:    lubiprostone (AMITIZA) 24 MCG capsule, Take by mouth 2 (two) times daily as needed., Disp: , Rfl:    methylPREDNISolone (MEDROL DOSEPAK) 4 MG TBPK tablet, See admin instructions., Disp: , Rfl:    montelukast (SINGULAIR) 10 MG  tablet, Take 1 tablet by mouth at bedtime., Disp: , Rfl:    morphine (MS CONTIN) 30 MG 12 hr tablet, Take 30 mg by mouth every 12 (twelve) hours., Disp: , Rfl:    morphine (MSIR) 15 MG tablet, Take 15 mg by mouth., Disp: , Rfl:    omeprazole (PRILOSEC) 40 MG capsule, Take 1 capsule (40 mg total) by mouth daily., Disp: 90 capsule, Rfl: 0   Oxycodone HCl 10 MG TABS, Take 10 mg by mouth 4 (four) times daily as needed., Disp: , Rfl:    promethazine (PHENERGAN) 25 MG tablet, Take 25 mg by mouth daily as needed., Disp: , Rfl:    QUEtiapine (SEROQUEL) 25 MG tablet, Take 1 tablet (25 mg total) by mouth at bedtime., Disp: 90 tablet, Rfl: 0   rizatriptan (MAXALT-MLT) 10 MG disintegrating tablet, Take by mouth., Disp: , Rfl:    rosuvastatin (CRESTOR) 5 MG tablet, Take 1 tablet (5 mg total) by mouth at bedtime., Disp: 90 tablet, Rfl: 1   silver sulfADIAZINE (SILVADENE) 1 % cream, Apply 1 Application topically daily., Disp: 50 g, Rfl: 0   theophylline (UNIPHYL) 400 MG 24 hr tablet, Take 1 tablet by mouth daily. , Disp: , Rfl:    tiotropium (SPIRIVA) 18 MCG inhalation capsule, Place 1 capsule into inhaler and inhale daily. pm, Disp: , Rfl:    tiZANidine (ZANAFLEX) 4 MG tablet, Take 1 tablet (4 mg total) by mouth every 6 (six) hours as needed for muscle spasms., Disp: 20 tablet, Rfl: 0   VENTOLIN HFA 108 (90 Base) MCG/ACT inhaler, INHALE 1 PUFF BY MOUTH AS NEEDED, Disp: 18 g, Rfl: 0  Allergies  Allergen Reactions   Augmentin [Amoxicillin-Pot Clavulanate] Diarrhea   Penicillins Itching    I personally reviewed active problem list, medication list, allergies, family history, social history, health maintenance with the patient/caregiver  today.   ROS  ***  Objective  There were no vitals filed for this visit.  There is no height or weight on file to calculate BMI.  Physical Exam ***  Recent Results (from the past 2160 hour(s))  POCT HgB A1C     Status: Abnormal   Collection Time: 12/27/21  2:44 PM  Result Value Ref Range   Hemoglobin A1C 6.2 (A) 4.0 - 5.6 %   HbA1c POC (<> result, manual entry)     HbA1c, POC (prediabetic range)     HbA1c, POC (controlled diabetic range)      PHQ2/9:    02/21/2022   11:11 AM 01/31/2022    9:20 AM 01/16/2022   11:05 AM 12/27/2021    2:40 PM 10/25/2021    1:52 PM  Depression screen PHQ 2/9  Decreased Interest 0 3 3 3 2  $ Down, Depressed, Hopeless 0 3 3 3 2  $ PHQ - 2 Score 0 6 6 6 4  $ Altered sleeping 0 3 3 3 2  $ Tired, decreased energy 0 3 3 3 2  $ Change in appetite 0 3 3 3 1  $ Feeling bad or failure about yourself  0 0 2 3 0  Trouble concentrating 0 3 3 3 3  $ Moving slowly or fidgety/restless 0 0 2 0 3  Suicidal thoughts 0 0 0 0 0  PHQ-9 Score 0 18 22 21 15  $ Difficult doing work/chores Not difficult at all Very difficult Very difficult Very difficult Somewhat difficult    phq 9 is {gen pos [id number]   Fall Risk:    02/21/2022   11:11 AM 01/31/2022  9:19 AM 01/16/2022   11:04 AM 12/27/2021    2:39 PM 10/25/2021    1:23 PM  Fall Risk   Falls in the past year? 1 1 1 1 1  $ Number falls in past yr: 1 1 1 1 1  $ Injury with Fall? 1 1 0 0 1  Risk for fall due to : Impaired balance/gait Impaired balance/gait;History of fall(s) History of fall(s) Impaired balance/gait;Other (Comment) History of fall(s)  Risk for fall due to: Comment    Parkinson's- Loss of balance   Follow up Falls prevention discussed;Education provided;Falls evaluation completed Falls prevention discussed;Education provided;Falls evaluation completed Falls prevention discussed;Falls evaluation completed;Education provided Falls prevention discussed Falls evaluation completed;Education provided;Falls  prevention discussed      Functional Status Survey:      Assessment & Plan  *** There are no diagnoses linked to this encounter.

## 2022-03-07 ENCOUNTER — Ambulatory Visit: Payer: 59 | Admitting: Family Medicine

## 2022-03-08 ENCOUNTER — Ambulatory Visit: Payer: Medicare Other | Admitting: Physician Assistant

## 2022-03-12 ENCOUNTER — Ambulatory Visit: Payer: Self-pay | Admitting: Physician Assistant

## 2022-03-22 DIAGNOSIS — J449 Chronic obstructive pulmonary disease, unspecified: Secondary | ICD-10-CM | POA: Diagnosis not present

## 2022-03-24 ENCOUNTER — Other Ambulatory Visit: Payer: Self-pay | Admitting: Family Medicine

## 2022-03-24 DIAGNOSIS — E1169 Type 2 diabetes mellitus with other specified complication: Secondary | ICD-10-CM

## 2022-03-24 DIAGNOSIS — J449 Chronic obstructive pulmonary disease, unspecified: Secondary | ICD-10-CM | POA: Diagnosis not present

## 2022-04-04 ENCOUNTER — Encounter: Payer: 59 | Attending: Internal Medicine | Admitting: Internal Medicine

## 2022-04-04 DIAGNOSIS — I5032 Chronic diastolic (congestive) heart failure: Secondary | ICD-10-CM | POA: Insufficient documentation

## 2022-04-04 DIAGNOSIS — E11622 Type 2 diabetes mellitus with other skin ulcer: Secondary | ICD-10-CM | POA: Insufficient documentation

## 2022-04-04 DIAGNOSIS — G20A1 Parkinson's disease without dyskinesia, without mention of fluctuations: Secondary | ICD-10-CM | POA: Insufficient documentation

## 2022-04-04 DIAGNOSIS — L89152 Pressure ulcer of sacral region, stage 2: Secondary | ICD-10-CM | POA: Insufficient documentation

## 2022-04-04 DIAGNOSIS — I110 Hypertensive heart disease with heart failure: Secondary | ICD-10-CM | POA: Insufficient documentation

## 2022-04-04 DIAGNOSIS — L97812 Non-pressure chronic ulcer of other part of right lower leg with fat layer exposed: Secondary | ICD-10-CM | POA: Insufficient documentation

## 2022-04-04 DIAGNOSIS — E43 Unspecified severe protein-calorie malnutrition: Secondary | ICD-10-CM | POA: Diagnosis not present

## 2022-04-04 DIAGNOSIS — J449 Chronic obstructive pulmonary disease, unspecified: Secondary | ICD-10-CM | POA: Diagnosis not present

## 2022-04-05 DIAGNOSIS — M542 Cervicalgia: Secondary | ICD-10-CM | POA: Diagnosis not present

## 2022-04-05 DIAGNOSIS — M5416 Radiculopathy, lumbar region: Secondary | ICD-10-CM | POA: Diagnosis not present

## 2022-04-05 DIAGNOSIS — K5903 Drug induced constipation: Secondary | ICD-10-CM | POA: Diagnosis not present

## 2022-04-05 DIAGNOSIS — M25569 Pain in unspecified knee: Secondary | ICD-10-CM | POA: Diagnosis not present

## 2022-04-05 DIAGNOSIS — R519 Headache, unspecified: Secondary | ICD-10-CM | POA: Diagnosis not present

## 2022-04-05 DIAGNOSIS — Z79891 Long term (current) use of opiate analgesic: Secondary | ICD-10-CM | POA: Diagnosis not present

## 2022-04-05 DIAGNOSIS — G894 Chronic pain syndrome: Secondary | ICD-10-CM | POA: Diagnosis not present

## 2022-04-06 ENCOUNTER — Telehealth: Payer: Self-pay | Admitting: Family Medicine

## 2022-04-06 NOTE — Telephone Encounter (Signed)
Spoke with patient and answered all questions and concerns and she will follow up with Dr. [name] at her next visit.

## 2022-04-06 NOTE — Telephone Encounter (Signed)
Pt went to wound care yesterday and she was advised that her medical records show that the pt has chronic kidney disease / pt has not heard that she has this issue / please advise

## 2022-04-07 NOTE — Progress Notes (Signed)
Tricia, [name] ([id number]) [id number] Page 1 of 7 Visit Report for 04/04/2022 Allergy List Details Patient Name: Date of Service: Tricia [name] [name]. 04/04/2022 1:30 PM Medical Record Number: [id number] Patient Account Number: [id number] Date of Birth/Sex: Treating RN: 1960/07/14 (62 y.o. Female) Tricia [name] Primary Care Tricia [name]: Tricia [name] Other Clinician: Referring Tricia [name]: Treating Tricia [name]: 0 Allergies Active Allergies Augmentin penicillin Allergy Notes Electronic Signature(s) Signed: 04/06/2022 2:19:49 PM By: Tricia Coria RN Entered By: Tricia [name] on 04/04/2022 13:35:22 -------------------------------------------------------------------------------- Arrival Information Details Patient Name: Date of Service: Tricia [name] Tricia [name]. 04/04/2022 1:30 PM Medical Record Number: [id number] Patient Account Number: [id number] Date of Birth/Sex: Treating RN: [birth year] (62 y.o. Female) Tricia [name] Primary Care Tricia [name]: Tricia [name] Other Clinician: Referring Khadijah Mastrianni: Treating Rameen Quinney/Extender: Donnelly Angelica in [name]: 0 Visit Information Patient Arrived: Ambulatory Arrival Time: 13:31 Accompanied By: self Transfer Assistance: None Patient Identification Verified: Yes Secondary Verification Process Completed: Yes Patient Requires Transmission-Based Precautions: No Patient Has Alerts: No Electronic Signature(s) Signed: 04/06/2022 2:19:49 PM By: Tricia Coria RN Tricia [name], Tricia [name] ([id number]) [id number] Page 2 of 7 Entered By: Tricia [name] on 04/04/2022 13:32:02 -------------------------------------------------------------------------------- Lower Extremity Assessment Details Patient Name: Date of Service: Tricia [name] 04/04/2022 1:30 PM Medical Record Number: [id number] Patient Account  Number: [id number] Date of Birth/Sex: Treating RN: 09-12-1960 (62 y.o. Female) Tricia [name] Primary Care Tricia [name]: Tricia [name] Other Clinician: Referring Tricia [name]: Treating Tricia [name]/Extender: Tricia [name] Weeks in [name]: 0 Electronic Signature(s) Signed: 04/06/2022 2:19:49 PM By: Tricia Coria RN Entered By: Tricia [name] on 04/04/2022 13:48:41 -------------------------------------------------------------------------------- Multi Wound Chart Details Patient Name: Date of Service: Tricia [name] Tricia [name]. 04/04/2022 1:30 PM Medical Record Number: [id number] Patient Account Number: [id number] Date of Birth/Sex: Treating RN: [birth year] (62 y.o. Female) Tricia [name] Primary Care Tricia [name]: Tricia [name] Other Clinician: Referring Tricia [name]: Treating Tricia [name] Weeks in [name]: 0 Vital Signs Height(in): 68 Pulse(bpm): 101 Weight(lbs): 137 Blood Pressure(mmHg): 129/74 Body Mass Index(BMI): 20.8 Temperature(F): 98.2 Respiratory Rate(breaths/min): 18 [1:Photos:] [N/A:N/A] Right, Posterior Upper Leg Coccyx N/A Wound Location: Gradually Appeared Pressure Injury N/A Wounding Event: Atypical Pressure Ulcer N/A Primary Etiology: Chronic Obstructive Pulmonary Chronic Obstructive Pulmonary N/A Comorbid History: Disease (COPD), Congestive Heart Disease (COPD), Congestive Heart Failure, Hypertension, Type II Failure, Hypertension, Type II [name], Tricia [name] ([id number]) [id number] Page 3 of 7 Diabetes Diabetes 02/19/2022 01/19/2022 N/A Date Acquired: 0 0 N/A Weeks of [name]: Open Open N/A Wound Status: No No N/A Wound Recurrence: 2.5x1x0.1 0.3x0.2x0.1 N/A Measurements [name] x W x D (cm) 1.963 0.047 N/A A (cm) : rea 0.196 0.005 N/A Volume (cm) : Full Thickness Without Exposed Category/Stage II N/A Classification: Support Structures Medium Medium N/A Exudate A  mount: Serosanguineous Serosanguineous N/A Exudate Type: red, brown red, brown N/A Exudate Color: None Present (0%) None Present (0%) N/A Granulation A mount: Large (67-100%) Large (67-100%) N/A Necrotic A mount: Eschar Eschar N/A Necrotic Tissue: Fascia: No Fat Layer (Subcutaneous Tissue): Yes N/A Exposed Structures: Fat Layer (Subcutaneous Tissue): No Fascia: No Tendon: No Tendon: No Muscle: No Muscle: No Joint: No Joint: No Bone: No Bone: No None None N/A Epithelialization: Debridement - Excisional N/A N/A Debridement: Pre-procedure Verification/Time Out 13:55 N/A N/A Taken: Subcutaneous, Slough N/A N/A Tissue Debrided: Skin/Subcutaneous Tissue N/A N/A Level: 2.5 N/A N/A Debridement A (sq cm): rea Curette N/A N/A Instrument: Minimum N/A N/A Bleeding: Pressure N/A N/A Hemostasis A chieved: 0 N/A N/A Procedural  Pain: 0 N/A N/A Post Procedural Pain: Procedure was tolerated well N/A N/A Debridement [name] Response: 2.5x1x0.1 N/A N/A Post Debridement Measurements [name] x W x D (cm) 0.196 N/A N/A Post Debridement Volume: (cm) Debridement N/A N/A Procedures Performed: [name] Notes Electronic Signature(s) Signed: 04/04/2022 3:27:54 PM By: Kalman Shan DO Entered By: Kalman Shan on 04/04/2022 14:19:43 -------------------------------------------------------------------------------- Pain Assessment Details Patient Name: Date of Service: Tricia [name] Tricia [name]. 04/04/2022 1:30 PM Medical Record Number: [id number] Patient Account Number: [id number] Date of Birth/Sex: Treating RN: [birth year] (62 y.o. Female) Tricia [name] Primary Care Tricia [name]: Tricia [name] Other Clinician: Referring Tricia [name]: Treating Tricia [name]/Extender: Tricia [name] Weeks in [name]: 0 Active Problems Location of Pain Severity and Description of Pain Patient Has Paino Yes Site Locations With Dressing Change: Yes Tricia [name], Tricia [name] ([id number])  [id number] Page 4 of 7 Duration of the Pain. Constant / Intermittento Intermittent How Long Does it Lasto Hours: Minutes: 15 Rate the pain. Current Pain Level: 4 Worst Pain Level: 5 Least Pain Level: 0 Tolerable Pain Level: 5 Character of Pain Describe the Pain: Burning Pain Management and Medication Current Pain Management: Medication: Yes Cold Application: No Rest: Yes Massage: No Activity: No T.E.N.S.: No Heat Application: No Leg drop or elevation: No Is the Current Pain Management Adequate: Inadequate How does your wound impact your activities of daily livingo Sleep: Yes Bathing: No Appetite: No Relationship With Others: No Bladder Continence: No Emotions: No Bowel Continence: No Work: No Toileting: No Drive: No Dressing: No Hobbies: No Electronic Signature(s) Signed: 04/06/2022 2:19:49 PM By: Tricia Coria RN Entered By: Tricia [name] on 04/04/2022 13:32:47 -------------------------------------------------------------------------------- Wound Assessment Details Patient Name: Date of Service: Tricia [name] Tricia [name]. 04/04/2022 1:30 PM Medical Record Number: [id number] Patient Account Number: [id number] Date of Birth/Sex: Treating RN: [birth year] (62 y.o. Female) Tricia [name] Primary Care Aundrea Horace: Tricia [name] Other Clinician: Referring Dorcus Riga: Treating Luciel Brickman/Extender: Tricia [name] Weeks in [name]: 0 Wound Status Wound Number: 1 Primary Atypical Etiology: Wound Location: Right, Posterior Upper Leg Wound Open Wounding Event: Gradually Appeared Status: Date Acquired: 02/19/2022 Comorbid Chronic Obstructive Pulmonary Disease (COPD), Congestive Weeks Of [name]: 0 History: Heart Failure, Hypertension, Type II Diabetes Clustered Wound: No Photos Tricia [name], Tricia [name] ([id number]) [id number] Page 5 of 7 Wound Measurements Length: (cm) 2.5 Width: (cm) 1 Depth: (cm)  0.1 Area: (cm) 1.963 Volume: (cm) 0.196 % Reduction in Area: % Reduction in Volume: Epithelialization: None Tunneling: No Undermining: No Wound Description Classification: Full Thickness Without Exposed Support Exudate Amount: Medium Exudate Type: Serosanguineous Exudate Color: red, brown Structures Foul Odor After Cleansing: No Slough/Fibrino Yes Wound Bed Granulation Amount: None Present (0%) Exposed Structure Necrotic Amount: Large (67-100%) Fascia Exposed: No Necrotic Quality: Eschar Fat Layer (Subcutaneous Tissue) Exposed: No Tendon Exposed: No Muscle Exposed: No Joint Exposed: No Bone Exposed: No Electronic Signature(s) Signed: 04/06/2022 2:19:49 PM By: Tricia Coria RN Entered By: Tricia [name] on 04/04/2022 13:47:10 -------------------------------------------------------------------------------- Wound Assessment Details Patient Name: Date of Service: Tricia [name] Tricia [name]. 04/04/2022 1:30 PM Medical Record Number: [id number] Patient Account Number: [id number] Date of Birth/Sex: Treating RN: 1960-08-15 (62 y.o. Female) Tricia [name] Primary Care Reiley Bertagnolli: Tricia [name] Other Clinician: Referring Arshan Jabs: Treating Tyde Lamison/Extender: Tricia [name] Weeks in [name]: 0 Wound Status Wound Number: 2 Primary Pressure Ulcer Etiology: Wound Location: Coccyx Wound Open Wounding Event: Pressure Injury Status: Date Acquired: 01/19/2022 Comorbid Chronic Obstructive Pulmonary Disease (COPD), Congestive Weeks Of [name]: 0 History: Heart Failure, Hypertension, Type II Diabetes Clustered Wound: No Photos  Tricia [name], Tricia [name] ([id number]) [id number] Page 6 of 7 Wound Measurements Length: (cm) 0.3 Width: (cm) 0.2 Depth: (cm) 0.1 Area: (cm) 0.047 Volume: (cm) 0.005 % Reduction in Area: % Reduction in Volume: Epithelialization: None Tunneling: No Undermining: No Wound Description Classification:  Category/Stage II Exudate Amount: Medium Exudate Type: Serosanguineous Exudate Color: red, brown Foul Odor After Cleansing: No Slough/Fibrino Yes Wound Bed Granulation Amount: None Present (0%) Exposed Structure Necrotic Amount: Large (67-100%) Fascia Exposed: No Necrotic Quality: Eschar Fat Layer (Subcutaneous Tissue) Exposed: Yes Tendon Exposed: No Muscle Exposed: No Joint Exposed: No Bone Exposed: No Electronic Signature(s) Signed: 04/06/2022 2:19:49 PM By: Tricia Coria RN Entered By: Tricia [name] on 04/04/2022 13:48:22 -------------------------------------------------------------------------------- Vitals Details Patient Name: Date of Service: Tricia [name] Tricia [name]. 04/04/2022 1:30 PM Medical Record Number: [id number] Patient Account Number: [id number] Date of Birth/Sex: Treating RN: Mar 29, 1960 (62 y.o. Female) Tricia [name] Primary Care Derreon Consalvo: Tricia [name] Other Clinician: Referring Issiac Jamar: Treating Chelsey Kimberley/Extender: Tricia [name] Weeks in [name]: 0 Vital Signs Time Taken: 13:34 Temperature (F): 98.2 Height (in): 68 Pulse (bpm): 101 Source: Stated Respiratory Rate (breaths/min): 18 Weight (lbs): 137 Blood Pressure (mmHg): 129/74 Source: Stated Reference Range: 80 - 120 mg / dl Body Mass Index (BMI): 20.8 Electronic Signature(s) Signed: 04/06/2022 2:19:49 PM By: Tricia Coria RN Tricia [name], Tricia [name] ([id number]) [id number] Page 7 of 7 Signed: 04/06/2022 2:19:49 PM By: Tricia Coria RN Entered By: Tricia [name] on 04/04/2022 13:35:05

## 2022-04-07 NOTE — Progress Notes (Signed)
Tricia, [name] ([id number]) [id number] Page 1 of 8 Visit Report for 04/04/2022 Chief Complaint Document Details Patient Name: Date of Service: Tricia [name] [facility] L. 04/04/2022 1:30 PM Medical Record Number: [id number] Patient Account Number: [id number] Date of Birth/Sex: Treating RN: May 28, 1960 (62 y.o. Female) Carlene Coria Primary Care Provider: [name] Other Clinician: Referring Provider: Treating Provider/Extender: Galen Manila Weeks in Treatment: 0 Information Obtained from: Patient Chief Complaint 04/04/2022; right posterior leg wound and sacral wound Electronic Signature(s) Signed: 04/04/2022 3:27:54 PM By: Kalman Shan DO Entered By: Kalman Shan on 04/04/2022 14:20:00 -------------------------------------------------------------------------------- Debridement Details Patient Name: Date of Service: Tricia [name] Tricia L. 04/04/2022 1:30 PM Medical Record Number: [id number] Patient Account Number: [id number] Date of Birth/Sex: Treating RN: 15-Jan-1961 (62 y.o. Female) Carlene Coria Primary Care Provider: [name] Other Clinician: Referring Provider: Treating Provider/Extender: Galen Manila Weeks in Treatment: 0 Debridement Performed for Assessment: Wound #1 Right,Posterior Upper Leg Performed By: Physician Kalman Shan, MD Debridement Type: Debridement Severity of Tissue Pre Debridement: Fat layer exposed Level of Consciousness (Pre-procedure): Awake and Alert Pre-procedure Verification/Time Out Yes - 13:55 Taken: Start Time: 13:55 T Area Debrided (L x W): otal 2.5 (cm) x 1 (cm) = 2.5 (cm) Tissue and other material debrided: Viable, Non-Viable, Slough, Subcutaneous, Slough Level: Skin/Subcutaneous Tissue Debridement Description: Excisional Instrument: Curette Bleeding: Minimum Hemostasis Achieved: Pressure End Time: 13:58 Procedural Pain: 0 Post Procedural  Pain: 0 Response to Treatment: Procedure was tolerated well Tricia, [name] L ([id number]) [id number] Page 2 of 8 Level of Consciousness (Post- Awake and Alert procedure): Post Debridement Measurements of Total Wound Length: (cm) 2.5 Width: (cm) 1 Depth: (cm) 0.1 Volume: (cm) 0.196 Character of Wound/Ulcer Post Debridement: Improved Severity of Tissue Post Debridement: Fat layer exposed Post Procedure Diagnosis Same as Pre-procedure Electronic Signature(s) Signed: 04/04/2022 3:27:54 PM By: Kalman Shan DO Signed: 04/06/2022 2:19:49 PM By: Carlene Coria RN Entered By: Carlene Coria on 04/04/2022 14:00:06 -------------------------------------------------------------------------------- HPI Details Patient Name: Date of Service: Tricia [name] Tricia L. 04/04/2022 1:30 PM Medical Record Number: [id number] Patient Account Number: [id number] Date of Birth/Sex: Treating RN: Aug 14, 1960 (62 y.o. Female) Carlene Coria Primary Care Provider: [name] Other Clinician: Referring Provider: Treating Provider/Extender: Galen Manila Weeks in Treatment: 0 History of Present Illness HPI Description: 04/04/2022 [name] is a 62 year old female with a past medical history of controlled type 2 diabetes, COPD, chronic diastolic heart failure, and Parkinson's disease that presents the clinic for a 25-monthhistory of nonhealing ulcer to the sacrum and to the posterior right leg. She is not sure how these wounds developed. She can ambulate. She has been using Silvadene to the sacral wound and Neosporin to the right leg wound. She denies signs of infection. Electronic Signature(s) Signed: 04/04/2022 3:27:54 PM By: HKalman ShanDO Entered By: HKalman Shanon 04/04/2022 14:49:58 -------------------------------------------------------------------------------- Physical Exam Details Patient Name: Date of Service: Tricia NianBETH L. 04/04/2022 1:30 PM Medical Record Number: [id number] Account Number: [id number] of Birth/Sex: Treating RN: 712-16-1962(62y.o. Female) ECarlene CoriaPrimary Care Provider: SSteele SizerOther Clinician: RSELENIA, [name]([id number] Page 3 of 8 Referring Provider: Treating Provider/Extender: HGalen ManilaWeeks in Treatment: 0 Constitutional . Cardiovascular . Psychiatric . Notes Right posterior thigh: Open wound with nonviable tissue throughout. No signs of surrounding infection. T the sacral region there is an open slitlike wound with granulation tissue present. Again no signs of surrounding infection. o Electronic Signature(s)  Signed: 04/04/2022 3:27:54 PM By: Kalman Shan DO Entered By: Kalman Shan on 04/04/2022 14:50:34 -------------------------------------------------------------------------------- Physician Orders Details Patient Name: Date of Service: Tricia [name] Tricia L. 04/04/2022 1:30 PM Medical Record Number: [id number] Patient Account Number: [id number] Date of Birth/Sex: Treating RN: April 11, 1960 (62 y.o. 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 Bathing/ Shower/ Hygiene May shower; gently cleanse wound with antibacterial soap, rinse and pat dry prior to dressing wounds Anesthetic (Use 'Patient Medications' Section for Anesthetic Order Entry) Lidocaine applied to wound bed Edema Control - Lymphedema /  Segmental Compressive Device / Other Elevate, Exercise Daily and A void Standing for Long Periods of Time. Elevate legs to the level of the heart and pump ankles as often as possible Elevate leg(s) parallel to the floor when sitting. Wound Treatment Wound #1 - Upper Leg Wound Laterality: Right, Posterior Cleanser: Soap and Water 1 x Per Day/30 Days Discharge Instructions: Gently cleanse wound with antibacterial soap, rinse and pat dry prior to dressing wounds Prim Dressing: Xeroform 4x4-HBD (in/in) 1 x Per Day/30 Days ary Discharge Instructions: Apply Xeroform 4x4-HBD (in/in) as directed Tricia, [name] ([id number]) [id number] Page 4 of 8 Secondary Dressing: Coverlet Latex-Free Fabric Adhesive Dressings 1 x Per Day/30 Days Discharge Instructions: 1.5 x 2 Wound #2 - Coccyx Cleanser: Soap and Water 1 x Per Day/30 Days Discharge Instructions: Gently cleanse wound with antibacterial soap, rinse and pat dry prior to dressing wounds Topical: A and D ointment 1 x Per Day/30 Days Discharge Instructions: apply to coccyx daily and after visiting restroom Electronic Signature(s) Signed: 04/04/2022 3:27:54 PM By: Kalman Shan DO Entered By: Kalman Shan on 04/04/2022 15:04:07 -------------------------------------------------------------------------------- Problem List Details Patient Name: Date of Service: Tricia [name] Tricia L. 04/04/2022 1:30 PM Medical Record Number: [id number] Patient Account Number: [id number] Date of Birth/Sex: Treating RN: 10/03/1960 (62 y.o. Female) Carlene Coria Primary Care Provider: [name] Other Clinician: Referring Provider: Treating Provider/Extender: Galen Manila Weeks in Treatment: 0 Active Problems ICD-10 Encounter Code Description Active Date MDM Diagnosis L89.152 Pressure ulcer of sacral region, stage 2 04/04/2022 No Yes L97.812 Non-pressure chronic ulcer of other part of right lower  leg with fat layer 04/04/2022 No Yes exposed E11.622 Type 2 diabetes mellitus with other skin ulcer 04/04/2022 No Yes E43 Unspecified severe protein-calorie malnutrition 04/04/2022 No Yes Inactive Problems Resolved Problems Electronic Signature(s) Signed: 04/04/2022 3:27:54 PM By: Kalman Shan DO Entered By: Kalman Shan on 04/04/2022 14:48:48 Tricia [name] ([id number]) [id number] Page 5 of 8 -------------------------------------------------------------------------------- Progress Note Details Patient Name: Date of Service: Tricia [name]. 04/04/2022 1:30 PM Medical Record Number: [id number] Patient Account Number: [id number] Date of Birth/Sex: Treating RN: 07-Apr-1960 (62 y.o. Female) Carlene Coria Primary Care Provider: [name] Other Clinician: Referring Provider: Treating Provider/Extender: Galen Manila Weeks in Treatment: 0 Subjective Chief Complaint Information obtained from Patient 04/04/2022; right posterior leg wound and sacral wound History of Present Illness (HPI) 04/04/2022 [name] is a 62 year old female with a past medical history of controlled type 2 diabetes, COPD, chronic diastolic heart failure, and Parkinson's disease that presents the clinic for  a 66-monthhistory of nonhealing ulcer to the sacrum and to the posterior right leg. She is not sure how these wounds developed. She can ambulate. She has been using Silvadene to the sacral wound and Neosporin to the right leg wound. She denies signs of infection. Patient History Allergies Augmentin, penicillin Social History Current every day smoker, Marital Status - Divorced, Alcohol Use - Never, Drug Use - No History, Caffeine Use - Daily. Medical History Respiratory Patient has history of Chronic Obstructive Pulmonary Disease (COPD) Cardiovascular Patient has history of Congestive Heart Failure,  Hypertension Endocrine Patient has history of Type II Diabetes Patient is treated with Insulin, Oral Agents. Review of Systems (ROS) Eyes Complains or has symptoms of Glasses / Contacts. Integumentary (Skin) Complains or has symptoms of Wounds. Objective Constitutional Vitals Time Taken: 1:34 PM, Height: 68 in, Source: Stated, Weight: 137 lbs, Source: Stated, BMI: 20.8, Temperature: 98.2 F, Pulse: 101 bpm, Respiratory Rate: 18 breaths/min, Blood Pressure: 129/74 mmHg. General Notes: Right posterior thigh: Open wound with nonviable tissue throughout. No signs of surrounding infection. T the sacral region there is an open o slitlike wound with granulation tissue present. Again no signs of surrounding infection. Integumentary (Hair, Skin) Wound #1 status is Open. Original cause of wound was Gradually Appeared. The date acquired was: 02/19/2022. The wound is located on the Right,Posterior Upper Leg. The wound measures 2.5cm length x 1cm width x 0.1cm depth; 1.963cm^2 area and 0.196cm^3 volume. There is no tunneling or undermining noted. There is a medium amount of serosanguineous drainage noted. There is no granulation within the wound bed. There is a large (67-100%) amount of necrotic tissue within the wound bed including Eschar. Wound #2 status is Open. Original cause of wound was Pressure Injury. The date acquired was: 01/19/2022. The wound is located on the Coccyx. The wound measures 0.3cm length x 0.2cm width x 0.1cm depth; 0.047cm^2 area and 0.005cm^3 volume. There is Fat Layer (Subcutaneous Tissue) exposed. There is no RROSINA, DEMARINIS([id number] Page 6 of 8 tunneling or undermining noted. There is a medium amount of serosanguineous drainage noted. There is no granulation within the wound bed. There is a large (67-100%) amount of necrotic tissue within the wound bed including Eschar. Assessment Active Problems ICD-10 Pressure ulcer of  sacral region, stage 2 Non-pressure chronic ulcer of other part of right lower leg with fat layer exposed Type 2 diabetes mellitus with other skin ulcer Unspecified severe protein-calorie malnutrition Patient presents with a 1 month history of nonhealing ulcers to her sacrum and posterior right leg. The posterior right leg wound looks like this was from an abrasion and non healing due to type II diabetes. I debrided nonviable tissue. I recommended Xeroform here. It is unclear how her sacral wound started but she does have severe protein malnutrition with an albumin of 2.1 and a very bony prominence. We discussed the importance of aggressive offloading for her wound healing. She can ambulate and should be able to heal the wounds. I recommended repositioning every 1-2 hours and not applying direct pressure on the sacrum. We will discuss increasing protein intake at next clinic visit. Procedures Wound #1 Pre-procedure diagnosis of Wound #1 is a Diabetic Wound/Ulcer of the Lower Extremity located on the Right,Posterior Upper Leg .Severity of Tissue Pre Debridement is: Fat layer exposed. There was a Excisional Skin/Subcutaneous Tissue Debridement with a total area of 2.5 sq cm performed by HKalman Shan MD. With the following instrument(s): Curette to remove Viable and Non-Viable tissue/material. Material removed  includes Subcutaneous Tissue and Slough and. No specimens were taken. A time out was conducted at 13:55, prior to the start of the procedure. A Minimum amount of bleeding was controlled with Pressure. The procedure was tolerated well with a pain level of 0 throughout and a pain level of 0 following the procedure. Post Debridement Measurements: 2.5cm length x 1cm width x 0.1cm depth; 0.196cm^3 volume. Character of Wound/Ulcer Post Debridement is improved. Severity of Tissue Post Debridement is: Fat layer exposed. Post procedure Diagnosis Wound #1: Same as Pre-Procedure Plan Follow-up  Appointments: Return Appointment in 1 week. Bathing/ Shower/ Hygiene: May shower; gently cleanse wound with antibacterial soap, rinse and pat dry prior to dressing wounds Anesthetic (Use 'Patient Medications' Section for Anesthetic Order Entry): Lidocaine applied to wound bed Edema Control - Lymphedema / Segmental Compressive Device / Other: Elevate, Exercise Daily and Avoid Standing for Long Periods of Time. Elevate legs to the level of the heart and pump ankles as often as possible Elevate leg(s) parallel to the floor when sitting. WOUND #1: - Upper Leg Wound Laterality: Right, Posterior Cleanser: Soap and Water 1 x Per Day/30 Days Discharge Instructions: Gently cleanse wound with antibacterial soap, rinse and pat dry prior to dressing wounds Prim Dressing: Xeroform 4x4-HBD (in/in) 1 x Per Day/30 Days ary Discharge Instructions: Apply Xeroform 4x4-HBD (in/in) as directed Secondary Dressing: Coverlet Latex-Free Fabric Adhesive Dressings 1 x Per Day/30 Days Discharge Instructions: 1.5 x 2 WOUND #2: - Coccyx Wound Laterality: Cleanser: Soap and Water 1 x Per Day/30 Days Discharge Instructions: Gently cleanse wound with antibacterial soap, rinse and pat dry prior to dressing wounds Topical: A and D ointment 1 x Per Day/30 Days Discharge Instructions: apply to coccyx daily and after visiting restroom 1. Xeroform to the right posterior leg wound 2. AandD ointment to the sacral wound 3. Aggressive offloading 4. Follow-up in 1 week Electronic Signature(s) Signed: 04/04/2022 3:27:54 PM By: Kalman Shan DO Entered By: Kalman Shan on 04/04/2022 15:03:53 Tricia [name] ([id number]) [id number] Page 7 of 8 -------------------------------------------------------------------------------- ROS/PFSH Details Patient Name: Date of Service: Tricia [name]. 04/04/2022 1:30 PM Medical Record Number: [id number] Patient Account Number:  [id number] Date of Birth/Sex: Treating RN: [birth year] (62 y.o. Female) Carlene Coria Primary Care Provider: [name] Other Clinician: Referring Provider: Treating Provider/Extender: Galen Manila Weeks in Treatment: 0 Eyes Complaints and Symptoms: Positive for: Glasses / Contacts Integumentary (Skin) Complaints and Symptoms: Positive for: Wounds Respiratory Medical History: Positive for: Chronic Obstructive Pulmonary Disease (COPD) Cardiovascular Medical History: Positive for: Congestive Heart Failure; Hypertension Endocrine Medical History: Positive for: Type II Diabetes Time with diabetes: 10 Treated with: Insulin, Oral agents Immunizations Pneumococcal Vaccine: Received Pneumococcal Vaccination: Yes Received Pneumococcal Vaccination On or After 60th Birthday: Yes Implantable Devices No devices added Family and Social History Current every day smoker; Marital Status - Divorced; Alcohol Use: Never; Drug Use: No History; Caffeine Use: Daily Electronic Signature(s) Signed: 04/04/2022 3:27:54 PM By: Kalman Shan DO Signed: 04/06/2022 2:19:49 PM By: Carlene Coria RN Entered By: Carlene Coria on 04/04/2022 13:38:23 Tricia [name] ([id number]) [id number] Page 8 of 8 -------------------------------------------------------------------------------- SuperBill Details Patient Name: Date of Service: Tricia [name] 04/04/2022 Medical Record Number: [id number] Patient Account Number: [id number] Date of Birth/Sex: Treating RN: 1960/09/28 (62 y.o. Female) Carlene Coria Primary Care Provider: [name] Other Clinician: Referring Provider: Treating Provider/Extender: Galen Manila Weeks in Treatment: 0 Diagnosis Coding ICD-10 Codes Code Description [phone] Pressure ulcer of sacral region, stage 2  [id number] Non-pressure chronic ulcer of other part of right lower leg with fat layer  exposed E11.622 Type 2 diabetes mellitus with other skin ulcer E43 Unspecified severe protein-calorie malnutrition Facility Procedures : CPT4 Code: [id number] Description: [id number] - DEB SUBQ TISSUE 20 SQ CM/< ICD-10 Diagnosis Description L97.812 Non-pressure chronic ulcer of other part of right lower leg with fat layer exp E11.622 Type 2 diabetes mellitus with other skin ulcer Modifier: osed Quantity: 1 Physician Procedures : CPT4 Code Description Modifier [id number] - WC PHYS LEVEL 4 - NEW PT ICD-10 Diagnosis Description L89.152 Pressure ulcer of sacral region, stage 2 L97.812 Non-pressure chronic ulcer of other part of right lower leg with fat layer exposed E11.622  Type 2 diabetes mellitus with other skin ulcer E43 Unspecified severe protein-calorie malnutrition Quantity: 1 : [id number] - WC PHYS SUBQ TISS 20 SQ CM ICD-10 Diagnosis Description L97.812 Non-pressure chronic ulcer of other part of right lower leg with fat layer exposed E11.622 Type 2 diabetes mellitus with other skin ulcer Quantity: 1 Electronic Signature(s) Signed: 04/04/2022 3:27:54 PM By: Kalman Shan DO Entered By: Kalman Shan on 04/04/2022 15:03:59

## 2022-04-07 NOTE — Progress Notes (Signed)
[name], [name] ([id number]) [id number] Page 1 of 5 Visit Report for 04/04/2022 Abuse Risk Screen Details Patient Name: Date of Service: Tricia [name] [facility] 04/04/2022 1:30 PM Medical Record Number: [id number] Patient Account Number: [id number] Date of Birth/Sex: Treating RN: 09-Jun-1960 (62 y.o. Female) Carlene Coria Primary Care Hurman Ketelsen: [name] Other Clinician: Referring Karolyna Bianchini: Treating Lafreda Casebeer/Extender: Galen Manila Weeks in Treatment: 0 Abuse Risk Screen Items Answer ABUSE RISK SCREEN: Has anyone close to you tried to hurt or harm you recentlyo No Do you feel uncomfortable with anyone in your familyo No Has anyone forced you do things that you didnt want to doo No Electronic Signature(s) Signed: 04/06/2022 2:19:49 PM By: Carlene Coria RN Entered By: Carlene Coria on 04/04/2022 13:38:31 -------------------------------------------------------------------------------- Activities of Daily Living Details Patient Name: Date of Service: Tricia [name]. 04/04/2022 1:30 PM Medical Record Number: [id number] Patient Account Number: [id number] Date of Birth/Sex: Treating RN: [birth year] (62 y.o. Female) Carlene Coria Primary Care Jabori Henegar: [name] Other Clinician: Referring Laurissa Cowper: Treating Broghan Pannone/Extender: Galen [place] Weeks in Treatment: 0 Activities of Daily Living Items Answer Activities of Daily Living (Please select one for each item) Drive Automobile Completely Able T Medications ake Completely Able Use T elephone Completely Able Care for Appearance Completely Able Use T oilet Completely Able Bath / Shower Completely Able Dress Self Completely Able Feed Self Completely Able Walk Completely Able Get In / Out Bed Completely Able Housework Completely Ehrhardt, Waller ([id number]) [id number] Page 2 of 5 Prepare  Meals Completely Able Handle Money Completely Able Shop for Self Completely Able Electronic Signature(s) Signed: 04/06/2022 2:19:49 PM By: Carlene Coria RN Entered By: Carlene Coria on 04/04/2022 13:38:54 -------------------------------------------------------------------------------- Education Screening Details Patient Name: Date of Service: Tricia [name] [name] 04/04/2022 1:30 PM Medical Record Number: [id number] Patient Account Number: [id number] Date of Birth/Sex: Treating RN: 03/05/1960 (62 y.o. Female) Carlene Coria Primary Care Loki Wuthrich: [name] Other Clinician: Referring [name]: Treating [name]/Extender: Donnelly [name]: 0 Primary Learner Assessed: Patient Learning Preferences/Education Level/Primary Language Learning Preference: Explanation Highest Education Level: College or Above Preferred Language: English Cognitive Barrier Language Barrier: No Translator Needed: No Memory Deficit: No Emotional Barrier: No Cultural/Religious Beliefs Affecting Medical Care: No Physical Barrier Impaired Vision: No Impaired Hearing: No Decreased Hand dexterity: No Knowledge/Comprehension Knowledge Level: Medium Comprehension Level: Medium Ability to understand written instructions: Medium Ability to understand verbal instructions: Medium Motivation Anxiety Level: Anxious Cooperation: Cooperative Education Importance: Acknowledges Need Interest in Health Problems: Asks Questions Perception: Coherent Willingness to Engage in Self-Management High Activities: Readiness to Engage in Self-Management High Activities: Electronic Signature(s) Signed: 04/06/2022 2:19:49 PM By: Carlene Coria RN Entered By: Carlene Coria on 04/04/2022 13:39:26 [name] ([id number]) [id number] Page 3 of 5 -------------------------------------------------------------------------------- Fall Risk Assessment  Details Patient Name: Date of Service: Tricia [name]. 04/04/2022 1:30 PM Medical Record Number: [id number] Patient Account Number: [id number] Date of Birth/Sex: Treating RN: Nov 25, 1960 (62 y.o. Female) Carlene Coria Primary Care [name]: [name] Other Clinician: Referring [name]: Treating [name]/Extender: Galen [place] Weeks in Treatment: 0 Fall Risk Assessment Items Have you had 2 or more falls in the last 12 monthso 0 Yes Have you had any fall that resulted in injury in the last 12 monthso 0 Yes FALLS RISK SCREEN History of falling - immediate or within 3 months 25 Yes Secondary diagnosis (Do you have 2 or more medical diagnoseso) 0 No  Ambulatory aid None/bed rest/wheelchair/nurse 0 Yes Crutches/cane/walker 0 No Furniture 0 No Intravenous therapy Access/Saline/Heparin Lock 0 No Gait/Transferring Normal/ bed rest/ wheelchair 0 Yes Weak (short steps with or without shuffle, stooped but able to lift head while walking, may seek 0 No support from furniture) Impaired (short steps with shuffle, may have difficulty arising from chair, head down, impaired 0 No balance) Mental Status Oriented to own ability 0 Yes Electronic Signature(s) Signed: 04/06/2022 2:19:49 PM By: Carlene Coria RN Entered By: Carlene Coria on 04/04/2022 13:39:45 -------------------------------------------------------------------------------- Foot Assessment Details Patient Name: Date of Service: Tricia [name] [name] 04/04/2022 1:30 PM Medical Record Number: [id number] Patient Account Number: [id number] Date of Birth/Sex: Treating RN: 07/07/1960 (62 y.o. Female) Carlene Coria Primary Care Osmond Steckman: [name] Other Clinician: Referring Carols Clemence: Treating [name]/Extender: Galen [place] Weeks in Treatment: 0 Foot Assessment Items Site Locations NEKESHIA, BRAMMER ([id number]) [phone] [id number] Page 4 of 5 + =  Sensation present, - = Sensation absent, C = Callus, U = Ulcer R = Redness, W = Warmth, M = Maceration, PU = Pre-ulcerative lesion F = Fissure, S = Swelling, D = Dryness Assessment Right: Left: Other Deformity: No No Prior Foot Ulcer: No No Prior Amputation: No No Charcot Joint: No No Ambulatory Status: Ambulatory Without Help Gait: Steady Electronic Signature(s) Signed: 04/06/2022 2:19:49 PM By: Carlene Coria RN Entered By: Carlene Coria on 04/04/2022 13:45:24 -------------------------------------------------------------------------------- Nutrition Risk Screening Details Patient Name: Date of Service: Tricia [name] [name] 04/04/2022 1:30 PM Medical Record Number: [id number] Patient Account Number: [id number] Date of Birth/Sex: Treating RN: [birth year] (62 y.o. Female) Carlene Coria Primary Care Comfort Iversen: [name] Other Clinician: Referring Duru Reiger: Treating Annissa Andreoni/Extender: Galen [place] Weeks in Treatment: 0 Height (in): 68 Weight (lbs): 137 Body Mass Index (BMI): 20.8 Nutrition Risk Screening Items Score Screening NUTRITION RISK SCREEN: I have an illness or condition that made me change the kind and/or amount of food I eat 0 No I eat fewer than two meals per day 0 No I eat few fruits and vegetables, or milk products 0 No I have three or more drinks of beer, liquor or wine almost every day 0 No I have tooth or mouth problems that make it hard for me to eat 0 No I don't always have enough money to buy the food I need 0 No TAMILIA, STOYER L ([id number]) [id number] Page 5 of 5 I eat alone most of the time 0 No I take three or more different prescribed or over-the-counter drugs a day 1 Yes Without wanting to, I have lost or gained 10 pounds in the last six months 0 No I am not always physically able to shop, cook and/or feed myself 0 No Nutrition Protocols Good Risk Protocol 0 No interventions  needed Moderate Risk Protocol High Risk Proctocol Risk Level: Good Risk Score: 1 Electronic Signature(s) Signed: 04/06/2022 2:19:49 PM By: Carlene Coria RN Entered By: Carlene Coria on 04/04/2022 13:39:59

## 2022-04-11 ENCOUNTER — Encounter (HOSPITAL_BASED_OUTPATIENT_CLINIC_OR_DEPARTMENT_OTHER): Payer: 59 | Admitting: Internal Medicine

## 2022-04-11 DIAGNOSIS — L89152 Pressure ulcer of sacral region, stage 2: Secondary | ICD-10-CM

## 2022-04-11 DIAGNOSIS — G20A1 Parkinson's disease without dyskinesia, without mention of fluctuations: Secondary | ICD-10-CM | POA: Diagnosis not present

## 2022-04-11 DIAGNOSIS — L97812 Non-pressure chronic ulcer of other part of right lower leg with fat layer exposed: Secondary | ICD-10-CM | POA: Diagnosis not present

## 2022-04-11 DIAGNOSIS — I5032 Chronic diastolic (congestive) heart failure: Secondary | ICD-10-CM | POA: Diagnosis not present

## 2022-04-11 DIAGNOSIS — I110 Hypertensive heart disease with heart failure: Secondary | ICD-10-CM | POA: Diagnosis not present

## 2022-04-11 DIAGNOSIS — J449 Chronic obstructive pulmonary disease, unspecified: Secondary | ICD-10-CM | POA: Diagnosis not present

## 2022-04-11 DIAGNOSIS — E11622 Type 2 diabetes mellitus with other skin ulcer: Secondary | ICD-10-CM | POA: Diagnosis not present

## 2022-04-11 DIAGNOSIS — E43 Unspecified severe protein-calorie malnutrition: Secondary | ICD-10-CM | POA: Diagnosis not present

## 2022-04-16 NOTE — Progress Notes (Signed)
MARKITTA, PLAGER ([id number]) [id number] Page 1 of 8 Visit Report for 04/11/2022 Chief Complaint Document Details Patient Name: Date of Service: Tricia [name] 04/11/2022 2:45 PM Medical Record Number: [id number] Patient Account Number: [id number] Date of Birth/Sex: Treating RN: October 08, 1960 (62 y.o. Tricia [name] Primary Care Provider: [name] Other Clinician: Massie Kluver Referring Provider: Treating Provider/Extender: Galen Manila Weeks in Treatment: 1 Information Obtained from: Patient Chief Complaint 04/04/2022; right posterior leg wound and sacral wound Electronic Signature(s) Signed: 04/11/2022 4:39:06 PM By: Kalman Shan DO Entered By: Kalman Shan on 04/11/2022 15:54:50 -------------------------------------------------------------------------------- Debridement Details Patient Name: Date of Service: Tricia [name] Tricia L. 04/11/2022 2:45 PM Medical Record Number: [id number] Patient Account Number: [id number] Date of Birth/Sex: Treating RN: 1960/04/24 (62 y.o. Tricia [name] Primary Care Provider: [name] Other Clinician: Massie Kluver Referring Provider: Treating Provider/Extender: Galen Manila Weeks in Treatment: 1 Debridement Performed for Assessment: Wound #1 Right,Posterior Upper Leg Performed By: Physician Kalman Shan, MD Debridement Type: Debridement Severity of Tissue Pre Debridement: Fat layer exposed Level of Consciousness (Pre-procedure): Awake and Alert Pre-procedure Verification/Time Out Yes - 15:51 Taken: Start Time: 15:51 T Area Debrided (L x W): otal 1 (cm) x 0.8 (cm) = 0.8 (cm) Tissue and other material debrided: Viable, Non-Viable, Slough, Slough Level: Non-Viable Tissue Debridement Description: Selective/Open Wound Instrument: Curette Bleeding: Minimum Hemostasis Achieved: Pressure Response to Treatment: Procedure was tolerated  well Level of Consciousness (Post- Awake and Alert procedure): DAJA, SCANLON ([id number]) [id number] Page 2 of 8 Post Debridement Measurements of Total Wound Length: (cm) 1 Width: (cm) 0.8 Depth: (cm) 0.1 Volume: (cm) 0.063 Character of Wound/Ulcer Post Debridement: Stable Severity of Tissue Post Debridement: Fat layer exposed Post Procedure Diagnosis Same as Pre-procedure Electronic Signature(s) Signed: 04/11/2022 4:39:06 PM By: Kalman Shan DO Signed: 04/12/2022 11:31:26 AM By: Gretta Cool, BSN, RN, CWS, Kim RN, BSN Signed: 04/16/2022 9:50:02 AM By: Massie Kluver Entered By: Massie Kluver on 04/11/2022 15:51:46 -------------------------------------------------------------------------------- HPI Details Patient Name: Date of Service: Tricia [name] Tricia L. 04/11/2022 2:45 PM Medical Record Number: [id number] Patient Account Number: [id number] Date of Birth/Sex: Treating RN: June 10, 1960 (62 y.o. Tricia [name] Primary Care Provider: [name] Other Clinician: Massie Kluver Referring Provider: Treating Provider/Extender: Galen Manila Weeks in Treatment: 1 History of Present Illness HPI Description: 04/04/2022 [name] is a 62 year old female with a past medical history of controlled type 2 diabetes, COPD, chronic diastolic heart failure, and Parkinson's disease that presents the clinic for a 82-monthhistory of nonhealing ulcer to the sacrum and to the posterior right leg. She is not sure how these wounds developed. She can ambulate. She has been using Silvadene to the sacral wound and Neosporin to the right leg wound. She denies signs of infection. 2/21; patient presents for follow-up. She has been using AandE ointment to the sacral wound and Xeroform to the right posterior leg wound. She has no issues or complaints today. There is been improvement in wound healing. Electronic Signature(s) Signed:  04/11/2022 4:39:06 PM By: HKalman ShanDO Entered By: HKalman Shanon 04/11/2022 15:55:15 -------------------------------------------------------------------------------- Physical Exam Details Patient Name: Date of Service: Tricia NianBETH L. 04/11/2022 2:45 PM Medical Record Number: [id number] Account Number: [id number] of Birth/Sex: Treating RN: [birth year](62y.o. FMarlowe ShoresPrimary Care Provider: SSteele SizerOther Clinician: VNaveen, [name]([id number] Page 3 of 8 Referring Provider: Treating Provider/Extender: HGalen ManilaWeeks in  Treatment: 1 Constitutional . Cardiovascular . Psychiatric . Notes Right posterior thigh: Open wound with nonviable tissue and granulation tissue. No signs of surrounding infection. T the sacral region there is an open slitlike o wound with granulation tissue present. Again no signs of surrounding infection. Electronic Signature(s) Signed: 04/11/2022 4:39:06 PM By: Kalman Shan DO Entered By: Kalman Shan on 04/11/2022 15:56:13 -------------------------------------------------------------------------------- Physician Orders Details Patient Name: Date of Service: Tricia [name] Tricia L. 04/11/2022 2:45 PM Medical Record Number: [id number] Patient Account Number: [id number] Date of Birth/Sex: Treating RN: [birth year] (62 y.o. Tricia [name] Primary Care Provider: [name] Other Clinician: Massie Kluver Referring Provider: Treating Provider/Extender: Donnelly [name]: 1 Verbal / Phone Orders: No Diagnosis Coding Follow-up Appointments Return Appointment in 1 week. Bathing/ Shower/ Hygiene May shower; gently cleanse wound with antibacterial soap, rinse and pat dry prior to dressing wounds Anesthetic (Use 'Patient Medications' Section for Anesthetic Order Entry) Lidocaine  applied to wound bed Edema Control - Lymphedema / Segmental Compressive Device / Other Elevate, Exercise Daily and A void Standing for Long Periods of Time. Elevate legs to the level of the heart and pump ankles as often as possible Elevate leg(s) parallel to the floor when sitting. Wound Treatment Wound #1 - Upper Leg Wound Laterality: Right, Posterior Cleanser: Soap and Water 1 x Per Day/30 Days Discharge Instructions: Gently cleanse wound with antibacterial soap, rinse and pat dry prior to dressing wounds Prim Dressing: Xeroform 4x4-HBD (in/in) 1 x Per Day/30 Days ary Discharge Instructions: Apply Xeroform 4x4-HBD (in/in) as directed Secondary Dressing: Coverlet Latex-Free Fabric Adhesive Dressings 1 x Per Day/30 Days Discharge Instructions: 1.5 x 2 Wound #2 - Coccyx Cleanser: Soap and Water 1 x Per Day/30 Days Discharge Instructions: Gently cleanse wound with antibacterial soap, rinse and pat dry prior to dressing wounds OCCIE, DELUCIA L ([id number]) [id number] Page 4 of 8 Topical: A and D ointment 1 x Per Day/30 Days Discharge Instructions: apply to coccyx daily and after visiting restroom Electronic Signature(s) Signed: 04/11/2022 4:39:06 PM By: Kalman Shan DO Entered By: Kalman Shan on 04/11/2022 15:58:19 -------------------------------------------------------------------------------- Problem List Details Patient Name: Date of Service: Tricia [name] Tricia L. 04/11/2022 2:45 PM Medical Record Number: [id number] Patient Account Number: [id number] Date of Birth/Sex: Treating RN: November 23, 1960 (62 y.o. Tricia [name] Primary Care Provider: [name] Other Clinician: Massie Kluver Referring Provider: Treating Provider/Extender: Galen Manila Weeks in Treatment: 1 Active Problems ICD-10 Encounter Code Description Active Date MDM Diagnosis L89.152 Pressure ulcer of sacral region, stage 2 04/04/2022 No  Yes L97.812 Non-pressure chronic ulcer of other part of right lower leg with fat layer 04/04/2022 No Yes exposed E11.622 Type 2 diabetes mellitus with other skin ulcer 04/04/2022 No Yes E43 Unspecified severe protein-calorie malnutrition 04/04/2022 No Yes Inactive Problems Resolved Problems Electronic Signature(s) Signed: 04/11/2022 4:39:06 PM By: Kalman Shan DO Entered By: Kalman Shan on 04/11/2022 15:54:46 Sieg, Lillette Boxer ([id number]) [id number] Page 5 of 8 -------------------------------------------------------------------------------- Progress Note Details Patient Name: Date of Service: Tricia [name] 04/11/2022 2:45 PM Medical Record Number: [id number] Patient Account Number: [id number] Date of Birth/Sex: Treating RN: 01/05/1961 (62 y.o. Tricia [name] Primary Care Provider: [name] Other Clinician: Massie Kluver Referring Provider: Treating Provider/Extender: Galen Manila Weeks in Treatment: 1 Subjective Chief Complaint Information obtained from Patient 04/04/2022; right posterior leg wound and sacral wound History of Present Illness (HPI) 04/04/2022 [name] is a 62 year old female with a past medical history  of controlled type 2 diabetes, COPD, chronic diastolic heart failure, and Parkinson's disease that presents the clinic for a 28-monthhistory of nonhealing ulcer to the sacrum and to the posterior right leg. She is not sure how these wounds developed. She can ambulate. She has been using Silvadene to the sacral wound and Neosporin to the right leg wound. She denies signs of infection. 2/21; patient presents for follow-up. She has been using AandE ointment to the sacral wound and Xeroform to the right posterior leg wound. She has no issues or complaints today. There is been improvement in wound healing. Objective Constitutional Vitals Time Taken: 3:19 PM, Height: 68 in, Weight:  137 lbs, BMI: 20.8, Temperature: 98.1 F, Pulse: 89 bpm, Respiratory Rate: 16 breaths/min, Blood Pressure: 164/95 mmHg. General Notes: Right posterior thigh: Open wound with nonviable tissue and granulation tissue. No signs of surrounding infection. T the sacral region there is o an open slitlike wound with granulation tissue present. Again no signs of surrounding infection. Integumentary (Hair, Skin) Wound #1 status is Open. Original cause of wound was Gradually Appeared. The date acquired was: 02/19/2022. The wound has been in treatment 1 weeks. The wound is located on the Right,Posterior Upper Leg. The wound measures 1cm length x 0.8cm width x 0.1cm depth; 0.628cm^2 area and 0.063cm^3 volume. There is a medium amount of serosanguineous drainage noted. There is no granulation within the wound bed. There is a large (67-100%) amount of necrotic tissue within the wound bed including Eschar. Wound #2 status is Open. Original cause of wound was Pressure Injury. The date acquired was: 01/19/2022. The wound has been in treatment 1 weeks. The wound is located on the Coccyx. The wound measures 0.1cm length x 0.1cm width x 0.1cm depth; 0.008cm^2 area and 0.001cm^3 volume. There is Fat Layer (Subcutaneous Tissue) exposed. There is a medium amount of serosanguineous drainage noted. There is no granulation within the wound bed. There is a large (67-100%) amount of necrotic tissue within the wound bed including Eschar. 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 I debrided nonviable tissue to the right lower extremity. I recommended continuing Xeroform here. Continue  AandE ointment and aggressive offloading to the sacral ulcer. Follow-up in 2 weeks. Procedures Wound #1 Pre-procedure diagnosis of Wound #1 is a Diabetic Wound/Ulcer of the Lower Extremity located on the Right,Posterior Upper Leg .Severity of Tissue Pre Debridement is: Fat layer exposed. There was a Selective/Open Wound Non-Viable Tissue Debridement with a total area of 0.8 sq cm performed by HKalman Shan MD. With the following instrument(s): Curette to remove Viable and Non-Viable tissue/material. Material removed includes SDelphos A time out was conducted at 15:51, prior to the start of the procedure. A Minimum amount of bleeding was controlled with Pressure. The procedure was tolerated well. Post Debridement Measurements: 1cm length x 0.8cm width x 0.1cm depth; 0.063cm^3 volume. Character of Wound/Ulcer Post Debridement is stable. Severity of Tissue Post Debridement is: Fat layer exposed. Post procedure Diagnosis Wound #1: Same as Pre-Procedure Plan Follow-up Appointments: Return Appointment in 1 week. Bathing/ Shower/ Hygiene: May shower; gently cleanse wound with antibacterial soap, rinse and pat dry prior to dressing wounds Anesthetic (Use 'Patient Medications' Section for Anesthetic Order Entry): Lidocaine applied to wound bed Edema Control - Lymphedema /  Segmental Compressive Device / Other: Elevate, Exercise Daily and Avoid Standing for Long Periods of Time. Elevate legs to the level of the heart and pump ankles as often as possible Elevate leg(s) parallel to the floor when sitting. WOUND #1: - Upper Leg Wound Laterality: Right, Posterior Cleanser: Soap and Water 1 x Per Day/30 Days Discharge Instructions: Gently cleanse wound with antibacterial soap, rinse and pat dry prior to dressing wounds Prim Dressing: Xeroform 4x4-HBD (in/in) 1 x Per Day/30 Days ary Discharge Instructions: Apply Xeroform 4x4-HBD (in/in) as directed Secondary Dressing: Coverlet Latex-Free Fabric  Adhesive Dressings 1 x Per Day/30 Days Discharge Instructions: 1.5 x 2 WOUND #2: - Coccyx Wound Laterality: Cleanser: Soap and Water 1 x Per Day/30 Days Discharge Instructions: Gently cleanse wound with antibacterial soap, rinse and pat dry prior to dressing wounds Topical: A and D ointment 1 x Per Day/30 Days Discharge Instructions: apply to coccyx daily and after visiting restroom 1. In office sharp debridement 2. Xeroformooright posterior leg wound 3. AandD ointment to the sacral wound 4. Follow-up in 1 week Electronic Signature(s) Signed: 04/11/2022 4:39:06 PM By: Kalman Shan DO Entered By: Kalman Shan on 04/11/2022 15:57:46 -------------------------------------------------------------------------------- ROS/PFSH Details Patient Name: Date of Service: Tricia [name] Tricia L. 04/11/2022 2:45 PM Medical Record Number: [id number] Patient Account Number: [id number] Date of Birth/Sex: Treating RN: 07/03/1960 (62 y.o. Tricia [name] Primary Care Provider: [name] Other Clinician: Massie Kluver Referring Provider: Treating Provider/Extender: Galen Manila Weeks in Treatment: 1 Respiratory ASHLEYANN, FLAHERTY ([id number]) [id number] Page 7 of 8 Medical History: Positive for: Chronic Obstructive Pulmonary Disease (COPD) Cardiovascular Medical History: Positive for: Congestive Heart Failure; Hypertension Endocrine Medical History: Positive for: Type II Diabetes Time with diabetes: 10 Treated with: Insulin, Oral agents Immunizations Pneumococcal Vaccine: Received Pneumococcal Vaccination: Yes Received Pneumococcal Vaccination On or After 60th Birthday: Yes Implantable Devices No devices added Family and Social History Current every day smoker; Marital Status - Divorced; Alcohol Use: Never; Drug Use: No History; Caffeine Use: Daily Electronic Signature(s) Signed: 04/11/2022 4:39:06 PM By: Kalman Shan  DO Signed: 04/12/2022 11:31:26 AM By: Gretta Cool, BSN, RN, CWS, Kim RN, BSN Entered By: Kalman Shan on 04/11/2022 15:58:28 -------------------------------------------------------------------------------- SuperBill Details Patient Name: Date of Service: Tricia [name] Tricia L. 04/11/2022 Medical Record Number: [id number] Patient Account Number: [id number] Date of Birth/Sex: Treating RN: 06-02-1960 (62 y.o. 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 E11.622 Type 2 diabetes mellitus with other skin ulcer E43 Unspecified severe protein-calorie malnutrition Facility Procedures : CPT4 Code: [id number] Description: [id number] - DEBRIDE WOUND 1ST 20 SQ CM OR < ICD-10 Diagnosis Description L97.812 Non-pressure chronic ulcer of other part of right lower leg with fat layer expos Modifier: ed Quantity: 1 Physician Procedures ALEATHA, GRAPER ([id number]): CPT4 Code Description [id number] - WC PHYS LEVEL 3 - EST PT ICD-10 Diagnosis Description L89.152 Pressure ulcer of sacral region, stage 2 [id number] Page 8 of 8: Quantity Modifier 25 1 TYIESHA, FILHO L ([id number]): [id number] - WC PHYS DEBR WO ANESTH 20 SQ CM ICD-10 Diagnosis Description [id number] Non-pressure chronic ulcer of other part of right lower leg with fa [id number] Page 8 of 8: 1 t layer exposed Electronic Signature(s) Signed: 04/11/2022 4:39:06 PM By: Kalman Shan  DO Entered By: Kalman Shan on 04/11/2022 15:58:11

## 2022-04-16 NOTE — Progress Notes (Signed)
Tricia, [name] ([id number]) [id number] Page 1 of 10 Visit Report for 04/11/2022 Arrival Information Details Patient Name: Date of Service: Tricia [name] 04/11/2022 2:45 PM Medical Record Number: [id number] Patient Account Number: [id number] Date of Birth/Sex: Treating RN: 01/22/1961 (62 y.o. Tricia [name] Primary Care Aralyn Nowak: [name] Other Clinician: Massie Kluver Referring Mousa Prout: Treating Janus Vlcek/Extender: Donnelly [name]: 1 Visit Information History Since Last Visit All ordered tests and consults were completed: No Patient Arrived: Ambulatory Added or deleted any medications: No Arrival Time: 15:09 Any new allergies or adverse reactions: No Transfer Assistance: None Had a fall or experienced change in No Patient Identification Verified: Yes activities of daily living that may affect Secondary Verification Process Completed: Yes risk of falls: Patient Requires Transmission-Based Precautions: No Signs or symptoms of abuse/neglect since last visito No Patient Has Alerts: No Hospitalized since last visit: No Implantable device outside of the clinic excluding No cellular tissue based products placed in the center since last visit: Has Dressing in Place as Prescribed: Yes Pain Present Now: Yes Electronic Signature(s) Signed: 04/16/2022 9:50:02 AM By: Massie Kluver Entered By: Massie Kluver on 04/11/2022 15:18:08 -------------------------------------------------------------------------------- Clinic Level of Care Assessment Details Patient Name: Date of Service: Tricia [name] 04/11/2022 2:45 PM Medical Record Number: [id number] Patient Account Number: [id number] Date of Birth/Sex: Treating RN: 20-Jan-1961 (61 y.o. Tricia [name] Primary Care Laveda Demedeiros: [name] Other Clinician: Massie Kluver Referring Lianette Broussard: Treating Haydan Wedig/Extender: Donnelly [name]: 1 Clinic Level of Care Assessment Items TOOL 1 Quantity Score '[]'$  - 0 Use when EandM and Procedure is performed on INITIAL visit ASSESSMENTS - Nursing Assessment / Reassessment '[]'$  - 0 General Physical Exam (combine w/ comprehensive assessment (listed just below) when performed on new pt. evals) '[]'$  - 0 Comprehensive Assessment (HX, ROS, Risk Assessments, Wounds Hx, etc.) PEGAH, SUCHODOLSKI L ([id number]) [phone].pdf Page 2 of 10 ASSESSMENTS - Wound and Skin Assessment / Reassessment '[]'$  - 0 Dermatologic / Skin Assessment (not related to wound area) ASSESSMENTS - Ostomy and/or Continence Assessment and Care '[]'$  - 0 Incontinence Assessment and Management '[]'$  - 0 Ostomy Care Assessment and Management (repouching, etc.) PROCESS - Coordination of Care '[]'$  - 0 Simple Patient / Family Education for ongoing care '[]'$  - 0 Complex (extensive) Patient / Family Education for ongoing care '[]'$  - 0 Staff obtains Programmer, systems, Records, T Results / Process Orders est '[]'$  - 0 Staff telephones HHA, Nursing Homes / Clarify orders / etc '[]'$  - 0 Routine Transfer to another Facility (non-emergent condition) '[]'$  - 0 Routine Hospital Admission (non-emergent condition) '[]'$  - 0 New Admissions / Biomedical engineer / Ordering NPWT Apligraf, etc. , '[]'$  - 0 Emergency Hospital Admission (emergent condition) PROCESS - Special Needs '[]'$  - 0 Pediatric / Minor Patient Management '[]'$  - 0 Isolation Patient Management '[]'$  - 0 Hearing / Language / Visual special needs '[]'$  - 0 Assessment of Community assistance (transportation, D/C planning, etc.) '[]'$  - 0 Additional assistance / Altered mentation '[]'$  - 0 Support Surface(s) Assessment (bed, cushion, seat, etc.) INTERVENTIONS - Miscellaneous '[]'$  - 0 External ear exam '[]'$  - 0 Patient Transfer (multiple staff / Civil Service fast streamer / Similar devices) '[]'$  - 0 Simple Staple / Suture removal (25 or less) '[]'$  -  0 Complex Staple / Suture removal (26 or more) '[]'$  - 0 Hypo/Hyperglycemic Management (do not check if billed separately) '[]'$  - 0 Ankle / Brachial Index (ABI) - do not check if billed separately  Has the patient been seen at the hospital within the last three years: Yes Total Score: 0 Level Of Care: ____ Electronic Signature(s) Signed: 04/16/2022 9:50:02 AM By: Massie Kluver Entered By: Massie Kluver on 04/11/2022 15:52:22 -------------------------------------------------------------------------------- Encounter Discharge Information Details Patient Name: Date of Service: Tricia Nian [name] 04/11/2022 2:45 PM Medical Record Number: [id number] Patient Account Number: [id number] Date of Birth/Sex: Treating RN: 06-07-1960 (62 y.o. Tricia [name] Primary Care Keeana Pieratt: [name] Other Clinician: Massie Kluver Referring Cipriana Biller: Treating Tonique Mendonca/Extender: Ashlie, Lindle ([id number]) [id number] Page 3 of 10 Weeks in Treatment: 1 Encounter Discharge Information Items Post Procedure Vitals Discharge Condition: Stable Temperature (F): 98.1 Ambulatory Status: Ambulatory Pulse (bpm): 89 Discharge Destination: Home Respiratory Rate (breaths/min): 16 Transportation: Private Auto Blood Pressure (mmHg): 164/95 Accompanied By: self Schedule Follow-up Appointment: Yes Clinical Summary of Care: Electronic Signature(s) Signed: 04/16/2022 9:50:02 AM By: Massie Kluver Entered By: Massie Kluver on 04/11/2022 16:12:03 -------------------------------------------------------------------------------- Lower Extremity Assessment Details Patient Name: Date of Service: Tricia [name]. 04/11/2022 2:45 PM Medical Record Number: [id number] Patient Account Number: [id number] Date of Birth/Sex: Treating RN: 11-07-1960 (62 y.o. Tricia [name] Primary Care Earnstine Meinders: [name] Other Clinician: Massie Kluver Referring Rasheedah Reis: Treating Aeisha Minarik/Extender: Galen Manila Weeks in Treatment: 1 Electronic Signature(s) Signed: 04/12/2022 11:31:26 AM By: Gretta Cool BSN, RN, CWS, Kim RN, BSN Signed: 04/16/2022 9:50:02 AM By: Massie Kluver Entered By: Massie Kluver on 04/11/2022 15:32:30 -------------------------------------------------------------------------------- Multi Wound Chart Details Patient Name: Date of Service: Tricia Nian [name] 04/11/2022 2:45 PM Medical Record Number: [id number] Patient Account Number: [id number] Date of Birth/Sex: Treating RN: Aug 28, 1960 (62 y.o. Tricia [name] Primary Care Jahmad Petrich: [name] Other Clinician: Massie Kluver Referring Eisa Necaise: Treating Savannah Erbe/Extender: Galen Manila Weeks in Treatment: 1 Vital Signs Height(in): 68 Pulse(bpm): 89 Weight(lbs): 137 Blood Pressure(mmHg): 164/95 Body Mass Index(BMI): 20.8 Temperature(F): 98.1 Respiratory Rate(breaths/min): 16 [Matheney, Tuwanna L ([id number]):Photos:] [phone].pdf Page 4 of 10:1 2 N/A N/A] Right, Posterior Upper Leg Coccyx N/A Wound Location: Gradually Appeared Pressure Injury N/A Wounding Event: Diabetic Wound/Ulcer of the Lower Pressure Ulcer N/A Primary Etiology: Extremity Chronic Obstructive Pulmonary Chronic Obstructive Pulmonary N/A Comorbid History: Disease (COPD), Congestive Heart Disease (COPD), Congestive Heart Failure, Hypertension, Type II Failure, Hypertension, Type II Diabetes Diabetes 02/19/2022 01/19/2022 N/A Date Acquired: 1 1 N/A Weeks of Treatment: Open Open N/A Wound Status: No No N/A Wound Recurrence: 1x0.8x0.1 0.1x0.1x0.1 N/A Measurements L x W x D (cm) 0.628 0.008 N/A A (cm) : rea 0.063 0.001 N/A Volume (cm) : 68.00% 83.00% N/A % Reduction in A rea: 67.90% 80.00% N/A % Reduction in Volume: Grade 1 Category/Stage II N/A Classification: Medium Medium N/A Exudate A  mount: Serosanguineous Serosanguineous N/A Exudate Type: red, brown red, brown N/A Exudate Color: None Present (0%) None Present (0%) N/A Granulation A mount: Large (67-100%) Large (67-100%) N/A Necrotic A mount: Eschar Eschar N/A Necrotic Tissue: Fascia: No Fat Layer (Subcutaneous Tissue): Yes N/A Exposed Structures: Fat Layer (Subcutaneous Tissue): No Fascia: No Tendon: No Tendon: No Muscle: No Muscle: No Joint: No Joint: No Bone: No Bone: No None None N/A Epithelialization: Debridement - Selective/Open Wound N/A N/A Debridement: Pre-procedure Verification/Time Out 15:51 N/A N/A Taken: Slough N/A N/A Tissue Debrided: Non-Viable Tissue N/A N/A Level: 0.8 N/A N/A Debridement A (sq cm): rea Curette N/A N/A Instrument: Minimum N/A N/A Bleeding: Pressure N/A N/A Hemostasis A chieved: Procedure was tolerated well N/A N/A Debridement Treatment Response: 1x0.8x0.1 N/A N/A Post  Debridement Measurements L x W x D (cm) 0.063 N/A N/A Post Debridement Volume: (cm) Debridement N/A N/A Procedures Performed: Treatment Notes Wound #1 (Upper Leg) Wound Laterality: Right, Posterior Cleanser Soap and Water Discharge Instruction: Gently cleanse wound with antibacterial soap, rinse and pat dry prior to dressing wounds Peri-Wound Care Topical Primary Dressing Xeroform 4x4-HBD (in/in) Discharge Instruction: Apply Xeroform 4x4-HBD (in/in) as directed Secondary Dressing Coverlet Latex-Free Fabric Adhesive Dressings Discharge Instruction: 1.5 x 2 Secured With Compression Wrap Compression Stockings TISCHA, COOPMAN ([id number]) [id number] Page 5 of 10 Add-Ons Wound #2 (Coccyx) Cleanser Soap and Water Discharge Instruction: Gently cleanse wound with antibacterial soap, rinse and pat dry prior to dressing wounds Peri-Wound Care Topical A and D ointment Discharge Instruction: apply to coccyx daily and after visiting restroom Primary  Dressing Secondary Dressing Secured With Compression Wrap Compression Stockings Add-Ons Electronic Signature(s) Signed: 04/11/2022 4:39:06 PM By: Kalman Shan DO Entered By: Kalman Shan on 04/11/2022 15:58:38 -------------------------------------------------------------------------------- Multi-Disciplinary Care Plan Details Patient Name: Date of Service: Tricia Nian [name] 04/11/2022 2:45 PM Medical Record Number: [id number] Patient Account Number: [id number] Date of Birth/Sex: Treating RN: Apr 12, 1960 (62 y.o. Tricia [name] Primary Care Uriel Horkey: [name] Other Clinician: Massie Kluver Referring Jahna Liebert: Treating Lexiana Spindel/Extender: Galen Manila Weeks in Treatment: 1 Active Inactive Electronic Signature(s) Signed: 04/12/2022 11:31:26 AM By: Gretta Cool BSN, RN, CWS, Kim RN, BSN Signed: 04/16/2022 9:50:02 AM By: Massie Kluver Entered By: Massie Kluver on 04/11/2022 15:52:33 [name] ([id number]) [id number] Page 6 of 10 -------------------------------------------------------------------------------- Pain Assessment Details Patient Name: Date of Service: Tricia [name] 04/11/2022 2:45 PM Medical Record Number: [id number] Patient Account Number: [id number] Date of Birth/Sex: Treating RN: [birth year] (61 y.o. Tricia [name] Primary Care Yavuz Kirby: [name] Other Clinician: Massie Kluver Referring Colby Reels: Treating Prithvi Kooi/Extender: Galen Manila Weeks in Treatment: 1 Active Problems Location of Pain Severity and Description of Pain Patient Has Paino Yes Site Locations Pain Location: Pain in Ulcers Duration of the Pain. Constant / Intermittento Constant Rate the pain. Current Pain Level: 8 Character of Pain Describe the Pain: Sharp, Stabbing Pain Management and Medication Current Pain Management: Medication: Yes Cold Application: No Rest: No Massage:  No Activity: No T.E.N.S.: No Heat Application: No Leg drop or elevation: No Is the Current Pain Management Adequate: Inadequate How does your wound impact your activities of daily livingo Sleep: No Bathing: No Appetite: No Relationship With Others: No Bladder Continence: No Emotions: No Bowel Continence: No Work: No Toileting: No Drive: No Dressing: No Hobbies: No Engineer, maintenance) Signed: 04/12/2022 11:31:26 AM By: Gretta Cool, BSN, RN, CWS, Kim RN, BSN Signed: 04/16/2022 9:50:02 AM By: Massie Kluver Entered By: Massie Kluver on 04/11/2022 15:22:47 [name] ([id number]) [id number] Page 7 of 10 -------------------------------------------------------------------------------- Patient/Caregiver Education Details Patient Name: Date of Service: Tricia [name] 2/21/2024andnbsp2:45 PM Medical Record Number: [id number] Patient Account Number: [id number] Date of Birth/Gender: Treating RN: [birth year] (62 y.o. Tricia [name] Primary Care Physician: [name] Other Clinician: Massie Kluver Referring Physician: Treating Physician/Extender: Donnelly [name]: 1 Education Assessment Education Provided To: Patient Education Topics Provided Wound/Skin Impairment: Handouts: Other: continue wound care as directed Methods: Explain/Verbal Responses: State content correctly Electronic Signature(s) Signed: 04/16/2022 9:50:02 AM By: Massie Kluver Entered By: Massie Kluver on 04/11/2022 15:52:56 -------------------------------------------------------------------------------- Wound Assessment Details Patient Name: Date of Service: Tricia Nian [name] 04/11/2022 2:45 PM Medical Record Number: [id number] Patient Account Number: [id number] Date of Birth/Sex:  Treating RN: 10/26/1960 (62 y.o. Tricia [name] Primary Care Destin Kittler: [name] Other Clinician: Massie Kluver Referring  Baruch Lewers: Treating Melquiades Kovar/Extender: Galen Manila Weeks in Treatment: 1 Wound Status Wound Number: 1 Primary Diabetic Wound/Ulcer of the Lower Extremity Etiology: Wound Location: Right, Posterior Upper Leg Wound Open Wounding Event: Gradually Appeared Status: Date Acquired: 02/19/2022 Comorbid Chronic Obstructive Pulmonary Disease (COPD), Congestive Weeks Of Treatment: 1 History: Heart Failure, Hypertension, Type II Diabetes Clustered Wound: No Photos ADELYNNE, SIME ([id number]) [id number] Page 8 of 10 Wound Measurements Length: (cm) 1 Width: (cm) 0.8 Depth: (cm) 0.1 Area: (cm) 0.628 Volume: (cm) 0.063 % Reduction in Area: 68% % Reduction in Volume: 67.9% Epithelialization: None Wound Description Classification: Grade 1 Exudate Amount: Medium Exudate Type: Serosanguineous Exudate Color: red, brown Foul Odor After Cleansing: No Slough/Fibrino Yes Wound Bed Granulation Amount: None Present (0%) Exposed Structure Necrotic Amount: Large (67-100%) Fascia Exposed: No Necrotic Quality: Eschar Fat Layer (Subcutaneous Tissue) Exposed: No Tendon Exposed: No Muscle Exposed: No Joint Exposed: No Bone Exposed: No Treatment Notes Wound #1 (Upper Leg) Wound Laterality: Right, Posterior Cleanser Soap and Water Discharge Instruction: Gently cleanse wound with antibacterial soap, rinse and pat dry prior to dressing wounds Peri-Wound Care Topical Primary Dressing Xeroform 4x4-HBD (in/in) Discharge Instruction: Apply Xeroform 4x4-HBD (in/in) as directed Secondary Dressing Coverlet Latex-Free Fabric Adhesive Dressings Discharge Instruction: 1.5 x 2 Secured With Compression Wrap Compression Stockings Add-Ons Electronic Signature(s) Signed: 04/12/2022 11:31:26 AM By: Gretta Cool, BSN, RN, CWS, Kim RN, BSN Signed: 04/16/2022 9:50:02 AM By: Massie Kluver Entered By: Massie Kluver on 04/11/2022 15:31:45 [name]  ([id number]) [id number] Page 9 of 10 -------------------------------------------------------------------------------- Wound Assessment Details Patient Name: Date of Service: Tricia [name] 04/11/2022 2:45 PM Medical Record Number: [id number] Patient Account Number: [id number] Date of Birth/Sex: Treating RN: [birth year] (62 y.o. Charolette Forward, Kim Primary Care Luiscarlos Kaczmarczyk: [name] Other Clinician: Massie Kluver Referring Mkayla Steele: Treating Srijan Givan/Extender: Galen Manila Weeks in Treatment: 1 Wound Status Wound Number: 2 Primary Pressure Ulcer Etiology: Wound Location: Coccyx Wound Open Wounding Event: Pressure Injury Status: Date Acquired: 01/19/2022 Comorbid Chronic Obstructive Pulmonary Disease (COPD), Congestive Weeks Of Treatment: 1 History: Heart Failure, Hypertension, Type II Diabetes Clustered Wound: No Photos Wound Measurements Length: (cm) 0.1 Width: (cm) 0.1 Depth: (cm) 0.1 Area: (cm) 0.008 Volume: (cm) 0.001 % Reduction in Area: 83% % Reduction in Volume: 80% Epithelialization: None Wound Description Classification: Category/Stage II Exudate Amount: Medium Exudate Type: Serosanguineous Exudate Color: red, brown Foul Odor After Cleansing: No Slough/Fibrino Yes Wound Bed Granulation Amount: None Present (0%) Exposed Structure Necrotic Amount: Large (67-100%) Fascia Exposed: No Necrotic Quality: Eschar Fat Layer (Subcutaneous Tissue) Exposed: Yes Tendon Exposed: No Muscle Exposed: No Joint Exposed: No Bone Exposed: No Treatment Notes Wound #2 (Coccyx) Cleanser Soap and Water Discharge Instruction: Gently cleanse wound with antibacterial soap, rinse and pat dry prior to dressing wounds Peri-Wound Care JAVONTE, ACHTERBERG ([id number]) [id number] Page 10 of 10 Topical A and D ointment Discharge Instruction: apply to coccyx daily and after visiting restroom Primary  Dressing Secondary Dressing Secured With Compression Wrap Compression Stockings Add-Ons Electronic Signature(s) Signed: 04/12/2022 11:31:26 AM By: Gretta Cool, BSN, RN, CWS, Kim RN, BSN Signed: 04/16/2022 9:50:02 AM By: Massie Kluver Entered By: Massie Kluver on 04/11/2022 15:32:11 -------------------------------------------------------------------------------- Vitals Details Patient Name: Date of Service: Tricia Nian [name] 04/11/2022 2:45 PM Medical Record Number: [id number] Patient Account Number: [id number] Date of Birth/Sex: Treating RN: 06-23-1960 (62 y.o. Tricia [name] Primary Care  Lopaka Karge: [name] Other Clinician: Massie Kluver Referring Emarion Toral: Treating Jodye Scali/Extender: Galen Manila Weeks in Treatment: 1 Vital Signs Time Taken: 15:19 Temperature (F): 98.1 Height (in): 68 Pulse (bpm): 89 Weight (lbs): 137 Respiratory Rate (breaths/min): 16 Body Mass Index (BMI): 20.8 Blood Pressure (mmHg): 164/95 Reference Range: 80 - 120 mg / dl Electronic Signature(s) Signed: 04/16/2022 9:50:02 AM By: Massie Kluver Entered By: Massie Kluver on 04/11/2022 15:22:40

## 2022-04-18 ENCOUNTER — Ambulatory Visit: Payer: 59 | Attending: Acute Care

## 2022-04-18 ENCOUNTER — Other Ambulatory Visit: Payer: Self-pay | Admitting: Family Medicine

## 2022-04-18 DIAGNOSIS — F331 Major depressive disorder, recurrent, moderate: Secondary | ICD-10-CM

## 2022-04-18 DIAGNOSIS — F411 Generalized anxiety disorder: Secondary | ICD-10-CM
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
## 2022-04-18 NOTE — Telephone Encounter (Signed)
Medication Refill - Medication: QUEtiapine (SEROQUEL) 25 MG tablet  DULoxetine (CYMBALTA) 60 MG capsule  Has the patient contacted their pharmacy? Yes.   Pharmacy advised pt their e scripts are down and prescription will need to be called in or faxed in.   Preferred Pharmacy (with phone number or street name):  [facility], [place] [address] Phone: [phone]  Fax: [phone]     Has the patient been seen for an appointment in the last year OR does the patient have an upcoming appointment? Yes.    Agent: Please be advised that RX refills may take up to 3 business days. We ask that you follow-up with your pharmacy.

## 2022-04-19 ENCOUNTER — Other Ambulatory Visit: Payer: Self-pay | Admitting: Specialist

## 2022-04-19 ENCOUNTER — Other Ambulatory Visit: Payer: Self-pay

## 2022-04-19 DIAGNOSIS — R053 Chronic cough: Secondary | ICD-10-CM

## 2022-04-19 DIAGNOSIS — F331 Major depressive disorder, recurrent, moderate: Secondary | ICD-10-CM

## 2022-04-19 DIAGNOSIS — R918 Other nonspecific abnormal finding of lung field: Secondary | ICD-10-CM

## 2022-04-19 DIAGNOSIS — F411 Generalized anxiety disorder: Secondary | ICD-10-CM

## 2022-04-19 MED ORDER — DULOXETINE HCL 60 MG PO CPEP: 60.0000 mg | ORAL_CAPSULE | Freq: Every day | ORAL | 0 refills | Status: DC

## 2022-04-19 MED ORDER — QUETIAPINE FUMARATE 25 MG PO TABS: 25.0000 mg | ORAL_TABLET | Freq: Every day | ORAL | 0 refills | Status: DC

## 2022-04-22 DIAGNOSIS — J449 Chronic obstructive pulmonary disease, unspecified: Secondary | ICD-10-CM | POA: Diagnosis not present

## 2022-04-25 ENCOUNTER — Encounter: Payer: 59 | Attending: Internal Medicine | Admitting: Internal Medicine

## 2022-04-25 DIAGNOSIS — Z6820 Body mass index (BMI) 20.0-20.9, adult: Secondary | ICD-10-CM | POA: Diagnosis not present

## 2022-04-25 DIAGNOSIS — E11622 Type 2 diabetes mellitus with other skin ulcer: Secondary | ICD-10-CM | POA: Diagnosis not present

## 2022-04-25 DIAGNOSIS — E43 Unspecified severe protein-calorie malnutrition: Secondary | ICD-10-CM | POA: Diagnosis not present

## 2022-04-25 DIAGNOSIS — F17200 Nicotine dependence, unspecified, uncomplicated: Secondary | ICD-10-CM | POA: Insufficient documentation

## 2022-04-25 DIAGNOSIS — I5032 Chronic diastolic (congestive) heart failure: Secondary | ICD-10-CM | POA: Diagnosis not present

## 2022-04-25 DIAGNOSIS — L97812 Non-pressure chronic ulcer of other part of right lower leg with fat layer exposed: Secondary | ICD-10-CM | POA: Diagnosis not present

## 2022-04-25 DIAGNOSIS — L89152 Pressure ulcer of sacral region, stage 2: Secondary | ICD-10-CM | POA: Insufficient documentation

## 2022-04-25 DIAGNOSIS — G20A1 Parkinson's disease without dyskinesia, without mention of fluctuations: Secondary | ICD-10-CM | POA: Diagnosis not present

## 2022-04-25 DIAGNOSIS — J449 Chronic obstructive pulmonary disease, unspecified: Secondary | ICD-10-CM | POA: Insufficient documentation

## 2022-04-25 DIAGNOSIS — I110 Hypertensive heart disease with heart failure: Secondary | ICD-10-CM | POA: Insufficient documentation

## 2022-04-27 DIAGNOSIS — J449 Chronic obstructive pulmonary disease, unspecified: Secondary | ICD-10-CM | POA: Diagnosis not present

## 2022-04-27 NOTE — Progress Notes (Signed)
ICYSS, WHISONANT ([id number]) [id number] Page 1 of 9 Visit Report for 04/25/2022 Arrival Information Details Patient Name: Date of Service: Tricia [name] 04/25/2022 2:45 PM Medical Record Number: [id number] Patient Account Number: [id number] Date of Birth/Sex: Treating RN: [birth year] (62 y.o. Tricia [name] Primary Care Tricia [name]: Tricia [name] Other Clinician: Massie [name] Referring Tricia [name]: Treating Tricia [name]/Extender: Tricia [name] in Treatment: 3 Visit Information History Since Last Visit All ordered tests and consults were completed: No Patient Arrived: Ambulatory Added or deleted any medications: No Arrival Time: 14:38 Any new allergies or adverse reactions: No Transfer Assistance: None Had a fall or experienced change in No Patient Identification Verified: Yes activities of daily living that may affect Secondary Verification Process Completed: Yes risk of falls: Patient Requires Transmission-Based Precautions: No Signs or symptoms of abuse/neglect since last visito No Patient Has Alerts: No Hospitalized since last visit: No Implantable device outside of the clinic excluding No cellular tissue based products placed in the center since last visit: Has Dressing in Place as Prescribed: Yes Pain Present Now: No Electronic Signature(s) Signed: 04/25/2022 4:49:21 PM By: Tricia [name] Entered By: Tricia [name] on 04/25/2022 14:46:15 -------------------------------------------------------------------------------- Clinic Level of Care Assessment Details Patient Name: Date of Service: Tricia [name] 04/25/2022 2:45 PM Medical Record Number: [id number] Patient Account Number: [id number] Date of Birth/Sex: Treating RN: 21-Feb-1960 (62 y.o. Tricia [name] Primary Care Tricia [name]: Tricia [name] Other Clinician: Massie [name] Referring Tricia [name]: Treating Tricia [name]/Extender: Tricia [name] in Treatment: 3 Clinic Level of Care Assessment Items TOOL 4 Quantity Score '[]'$  - 0 Use when only an EandM is performed on FOLLOW-UP visit ASSESSMENTS - Nursing Assessment / Reassessment X- 1 10 Reassessment of Co-morbidities (includes updates in patient status) X- 1 5 Reassessment of Adherence to Treatment Plan [name], [name] ([id number]) [phone] Page 2 of 9 ASSESSMENTS - Wound and Skin A ssessment / Reassessment '[]'$  - 0 Simple Wound Assessment / Reassessment - one wound X- 2 5 Complex Wound Assessment / Reassessment - multiple wounds '[]'$  - 0 Dermatologic / Skin Assessment (not related to wound area) ASSESSMENTS - Focused Assessment '[]'$  - 0 Circumferential Edema Measurements - multi extremities '[]'$  - 0 Nutritional Assessment / Counseling / Intervention '[]'$  - 0 Lower Extremity Assessment (monofilament, tuning fork, pulses) '[]'$  - 0 Peripheral Arterial Disease Assessment (using hand held doppler) ASSESSMENTS - Ostomy and/or Continence Assessment and Care '[]'$  - 0 Incontinence Assessment and Management '[]'$  - 0 Ostomy Care Assessment and Management (repouching, etc.) PROCESS - Coordination of Care X - Simple Patient / Family Education for ongoing care 1 15 '[]'$  - 0 Complex (extensive) Patient / Family Education for ongoing care '[]'$  - 0 Staff obtains Programmer, systems, Records, T Results / Process Orders est '[]'$  - 0 Staff telephones HHA, Nursing Homes / Clarify orders / etc '[]'$  - 0 Routine Transfer to another Facility (non-emergent condition) '[]'$  - 0 Routine Hospital Admission (non-emergent condition) '[]'$  - 0 New Admissions / Biomedical engineer / Ordering NPWT Apligraf, etc. , '[]'$  - 0 Emergency Hospital Admission (emergent condition) X- 1 10 Simple Discharge Coordination '[]'$  - 0 Complex (extensive) Discharge Coordination PROCESS - Special Needs '[]'$  - 0 Pediatric / Minor Patient Management '[]'$  - 0 Isolation Patient Management '[]'$  -  0 Hearing / Language / Visual special needs '[]'$  - 0 Assessment of Community assistance (transportation, D/C planning, etc.) '[]'$  - 0 Additional assistance / Altered mentation '[]'$  - 0 Support Surface(s) Assessment (bed, cushion, seat, etc.) INTERVENTIONS -  Wound Cleansing / Measurement '[]'$  - 0 Simple Wound Cleansing - one wound X- 2 5 Complex Wound Cleansing - multiple wounds X- 1 5 Wound Imaging (photographs - any number of wounds) '[]'$  - 0 Wound Tracing (instead of photographs) '[]'$  - 0 Simple Wound Measurement - one wound '[]'$  - 0 Complex Wound Measurement - multiple wounds INTERVENTIONS - Wound Dressings X - Small Wound Dressing one or multiple wounds 1 10 '[]'$  - 0 Medium Wound Dressing one or multiple wounds '[]'$  - 0 Large Wound Dressing one or multiple wounds X- 1 5 Application of Medications - topical '[]'$  - 0 Application of Medications - injection INTERVENTIONS - Miscellaneous '[]'$  - 0 External ear exam Tricia, [name] ([id number]) [phone].pdf Page 3 of 9 '[]'$  - 0 Specimen Collection (cultures, biopsies, blood, body fluids, etc.) '[]'$  - 0 Specimen(s) / Culture(s) sent or taken to Lab for analysis '[]'$  - 0 Patient Transfer (multiple staff / Tricia [name] / Similar devices) '[]'$  - 0 Simple Staple / Suture removal (25 or less) '[]'$  - 0 Complex Staple / Suture removal (26 or more) '[]'$  - 0 Hypo / Hyperglycemic Management (close monitor of Blood Glucose) '[]'$  - 0 Ankle / Brachial Index (ABI) - do not check if billed separately X- 1 5 Vital Signs Has the patient been seen at the hospital within the last three years: Yes Total Score: 85 Level Of Care: New/Established - Level 3 Electronic Signature(s) Signed: 04/25/2022 4:49:21 PM By: Tricia [name] Entered By: Tricia [name] on 04/25/2022 15:18:11 -------------------------------------------------------------------------------- Encounter Discharge Information Details Patient Name: Date of Service: Tricia [name]. 04/25/2022 2:45 PM Medical Record Number: [id number] Patient Account Number: [id number] Date of Birth/Sex: Treating RN: [birth year] (62 y.o. Tricia [name] Primary Care Tricia [name]: Tricia [name] Other Clinician: Massie [name] Referring Jameria Bradway: Treating Jandiel Magallanes/Extender: Tricia [name] in Treatment: 3 Encounter Discharge Information Items Discharge Condition: Stable Ambulatory Status: Ambulatory Discharge Destination: Home Transportation: Private Auto Accompanied By: self Schedule Follow-up Appointment: Yes Clinical Summary of Care: Electronic Signature(s) Signed: 04/25/2022 4:49:21 PM By: Tricia [name] Entered By: Tricia [name] on 04/25/2022 15:28:50 Lower Extremity Assessment Details -------------------------------------------------------------------------------- Tricia [name] ([id number]) [id number] Page 4 of 9 Patient Name: Date of Service: Tricia [name] 04/25/2022 2:45 PM Medical Record Number: [id number] Patient Account Number: [id number] Date of Birth/Sex: Treating RN: 06-11-1960 (62 y.o. Tricia [name] Primary Care Karrine Kluttz: Tricia [name] Other Clinician: Massie [name] Referring Isys Tietje: Treating Janee Ureste/Extender: Tricia [name] Weeks in Treatment: 3 Electronic Signature(s) Signed: 04/25/2022 4:49:21 PM By: Tricia [name] Signed: 04/25/2022 9:49:41 PM By: Tricia [name], BSN, RN, CWS, Kim RN, BSN Entered By: Tricia [name] on 04/25/2022 15:13:19 -------------------------------------------------------------------------------- Multi Wound Chart Details Patient Name: Date of Service: Tricia [name]. 04/25/2022 2:45 PM Medical Record Number: [id number] Patient Account Number: [id number] Date of Birth/Sex: Treating RN: 1960-09-19 (62 y.o. Tricia [name], Tricia [name] Primary Care Kyliegh Jester: Tricia [name] Other Clinician: Massie [name] Referring Demere Dotzler: Treating Anayah Arvanitis/Extender:  Tricia [name] Weeks in Treatment: 3 Vital Signs Height(in): 61 Pulse(bpm): 109 Weight(lbs): 137 Blood Pressure(mmHg): 121/76 Body Mass Index(BMI): 20.8 Temperature(F): 98.3 Respiratory Rate(breaths/min): 16 [1:Photos:] [N/A:N/A] Right, Posterior Upper Leg Coccyx N/A Wound Location: Gradually Appeared Pressure Injury N/A Wounding Event: Diabetic Wound/Ulcer of the Lower Pressure Ulcer N/A Primary Etiology: Extremity Chronic Obstructive Pulmonary Chronic Obstructive Pulmonary N/A Comorbid History: Disease (COPD), Congestive Heart Disease (COPD), Congestive Heart Failure, Hypertension, Type II Failure, Hypertension, Type II Diabetes Diabetes 02/19/2022 01/19/2022 N/A Date Acquired: 3 3  N/A Weeks of Treatment: Healed - Epithelialized Healed - Epithelialized N/A Wound Status: No No N/A Wound Recurrence: 0x0x0 0x0x0 N/A Measurements [name] x W x D (cm) 0 0 N/A A (cm) : rea 0 0 N/A Volume (cm) : 100.00% 100.00% N/A % Reduction in A rea: 100.00% 100.00% N/A % Reduction in Volume: Grade 1 Category/Stage II N/A Classification: None Present None Present N/A Exudate A mount: None Present (0%) None Present (0%) N/A Granulation A mount: None Present (0%) None Present (0%) N/A Necrotic A mount: Fascia: No Fat Layer (Subcutaneous Tissue): Yes N/A Exposed Structures: Fat Layer (Subcutaneous Tissue): No Fascia: No Tendon: No Tendon: No Tricia [name], Tricia [name] ([id number]) [id number] Page 5 of 9 Muscle: No Muscle: No Joint: No Joint: No Bone: No Bone: No Large (67-100%) Large (67-100%) N/A Epithelialization: Treatment Notes Electronic Signature(s) Signed: 04/25/2022 4:49:21 PM By: Tricia [name] Entered By: Tricia [name] on 04/25/2022 15:13:27 -------------------------------------------------------------------------------- Multi-Disciplinary Care Plan Details Patient Name: Date of Service: Tricia [name].  04/25/2022 2:45 PM Medical Record Number: [id number] Patient Account Number: [id number] Date of Birth/Sex: Treating RN: 21-May-1960 (62 y.o. Tricia [name] Primary Care Tymar Polyak: Tricia [name] Other Clinician: Massie [name] Referring Perina Salvaggio: Treating Ladeja Pelham/Extender: Tricia [name] Weeks in Treatment: 3 Active Inactive Electronic Signature(s) Signed: 04/25/2022 4:49:21 PM By: Tricia [name] Signed: 04/25/2022 9:49:41 PM By: Tricia [name], BSN, RN, CWS, Kim RN, BSN Entered By: Tricia [name] on 04/25/2022 15:28:08 -------------------------------------------------------------------------------- Pain Assessment Details Patient Name: Date of Service: Tricia [name]. 04/25/2022 2:45 PM Medical Record Number: [id number] Patient Account Number: [id number] Date of Birth/Sex: Treating RN: 06/20/1960 (62 y.o. Tricia [name] Primary Care Mayford Alberg: Tricia [name] Other Clinician: Massie [name] Referring Raider Valbuena: Treating Arrielle Mcginn/Extender: Tricia [name] Weeks in Treatment: 3 Active Problems Location of Pain Severity and Description of Pain Patient Has Paino No Site Locations Tricia [name], Tricia [name] ([id number]) [id number].pdf Page 6 of 9 Pain Management and Medication Current Pain Management: Electronic Signature(s) Signed: 04/25/2022 4:49:21 PM By: Tricia [name] Signed: 04/25/2022 9:49:41 PM By: Tricia [name], BSN, RN, CWS, Kim RN, BSN Entered By: Tricia [name] on 04/25/2022 14:48:29 -------------------------------------------------------------------------------- Patient/Caregiver Education Details Patient Name: Date of Service: Tricia [name] 3/6/2024andnbsp2:45 PM Medical Record Number: [id number] Patient Account Number: [id number] Date of Birth/Gender: Treating RN: 1960/05/21 (62 y.o. Tricia [name] Primary Care Physician: Tricia [name] Other Clinician: Massie [name] Referring Physician: Treating Physician/Extender:  Tricia [name] in Treatment: 3 Education Assessment Education Provided To: Patient Education Topics Provided Wound/Skin Impairment: Handouts: Other: Your wound has healed. Please call if any further issues arise Methods: Explain/Verbal Responses: State content correctly Electronic Signature(s) Signed: 04/25/2022 4:49:21 PM By: Tricia [name] Entered By: Tricia [name] on 04/25/2022 15:28:13 Tricia [name] ([id number]) [id number] Page 7 of 9 -------------------------------------------------------------------------------- Wound Assessment Details Patient Name: Date of Service: Tricia [name] 04/25/2022 2:45 PM Medical Record Number: [id number] Patient Account Number: [id number] Date of Birth/Sex: Treating RN: 1960-02-23 (62 y.o. Tricia [name] Primary Care Virga Haltiwanger: Tricia [name] Other Clinician: Massie [name] Referring Shahrukh Pasch: Treating Coran Dipaola/Extender: Tricia [name] Weeks in Treatment: 3 Wound Status Wound Number: 1 Primary Diabetic Wound/Ulcer of the Lower Extremity Etiology: Wound Location: Right, Posterior Upper Leg Wound Healed - Epithelialized Wounding Event: Gradually Appeared Status: Date Acquired: 02/19/2022 Comorbid Chronic Obstructive Pulmonary Disease (COPD), Congestive Weeks Of Treatment: 3 History: Heart Failure, Hypertension, Type II Diabetes Clustered Wound: No Photos Wound Measurements Length: (cm) 0 Width: (cm) 0 Depth: (cm) 0 Area: (  cm) Volume: (cm) % Reduction in Area: 100% % Reduction in Volume: 100% Epithelialization: Large (67-100%) 0 0 Wound Description Classification: Grade 1 Exudate Amount: None Present Foul Odor After Cleansing: No Slough/Fibrino No Wound Bed Granulation Amount: None Present (0%) Exposed Structure Necrotic Amount: None Present (0%) Fascia Exposed: No Fat Layer (Subcutaneous Tissue) Exposed: No Tendon Exposed: No Muscle  Exposed: No Joint Exposed: No Bone Exposed: No Treatment Notes Wound #1 (Upper Leg) Wound Laterality: Right, Posterior Cleanser Peri-Wound Care Topical Primary Dressing Tricia [name], Tricia [name] ([id number]) [phone].pdf Page 8 of 9 Secondary Dressing Secured With Compression Wrap Compression Stockings Add-Ons Electronic Signature(s) Signed: 04/25/2022 4:49:21 PM By: Tricia [name] Signed: 04/25/2022 9:49:41 PM By: Tricia [name], BSN, RN, CWS, Kim RN, BSN Entered By: Tricia [name] on 04/25/2022 15:12:16 -------------------------------------------------------------------------------- Wound Assessment Details Patient Name: Date of Service: Tricia [name]. 04/25/2022 2:45 PM Medical Record Number: [id number] Patient Account Number: [id number] Date of Birth/Sex: Treating RN: 05/15/1960 (62 y.o. Tricia [name], Tricia [name] Primary Care Marquavius Scaife: Tricia [name] Other Clinician: Massie [name] Referring Ashtan Girtman: Treating Jeriah Skufca/Extender: Tricia [name] Weeks in Treatment: 3 Wound Status Wound Number: 2 Primary Pressure Ulcer Etiology: Wound Location: Coccyx Wound Healed - Epithelialized Wounding Event: Pressure Injury Status: Date Acquired: 01/19/2022 Comorbid Chronic Obstructive Pulmonary Disease (COPD), Congestive Weeks Of Treatment: 3 History: Heart Failure, Hypertension, Type II Diabetes Clustered Wound: No Photos Wound Measurements Length: (cm) Width: (cm) Depth: (cm) Area: (cm) Volume: (cm) 0 % Reduction in Area: 100% 0 % Reduction in Volume: 100% 0 Epithelialization: Large (67-100%) 0 0 Wound Description Classification: Category/Stage II Exudate Amount: None Present Foul Odor After Cleansing: No Slough/Fibrino No Wound Bed Granulation Amount: None Present (0%) Exposed Structure Necrotic Amount: None Present (0%) Fascia Exposed: No Rahn, Casady [name] ([id number]) [id number] Page 9 of 9 Fat  Layer (Subcutaneous Tissue) Exposed: Yes Tendon Exposed: No Muscle Exposed: No Joint Exposed: No Bone Exposed: No Treatment Notes Wound #2 (Coccyx) Cleanser Peri-Wound Care Topical Primary Dressing Secondary Dressing Secured With Compression Wrap Compression Stockings Add-Ons Electronic Signature(s) Signed: 04/25/2022 4:49:21 PM By: Tricia [name] Signed: 04/25/2022 9:49:41 PM By: Tricia [name], BSN, RN, CWS, Kim RN, BSN Entered By: Tricia [name] on 04/25/2022 15:13:14 -------------------------------------------------------------------------------- Vitals Details Patient Name: Date of Service: Tricia [name]. 04/25/2022 2:45 PM Medical Record Number: [id number] Patient Account Number: [id number] Date of Birth/Sex: Treating RN: 24-Feb-1960 (62 y.o. Tricia [name], Tricia [name] Primary Care Wilba Mutz: Tricia [name] Other Clinician: Massie [name] Referring Shanah Guimaraes: Treating Cleota Pellerito/Extender: Tricia [name] Weeks in Treatment: 3 Vital Signs Time Taken: 14:46 Temperature (F): 98.3 Height (in): 68 Pulse (bpm): 91 Weight (lbs): 137 Respiratory Rate (breaths/min): 16 Body Mass Index (BMI): 20.8 Blood Pressure (mmHg): 121/76 Reference Range: 80 - 120 mg / dl Electronic Signature(s) Signed: 04/25/2022 4:49:21 PM By: Tricia [name] Entered By: Tricia [name] on 04/25/2022 14:48:26

## 2022-04-27 NOTE — Progress Notes (Signed)
SERENDIPITY, THACKERAY ([id number]) [id number] Page 1 of 6 Visit Report for 04/25/2022 Chief Complaint Document Details Patient Name: Date of Service: Tricia [name] 04/25/2022 2:45 PM Medical Record Number: [id number] Patient Account Number: [id number] Date of Birth/Sex: Treating RN: September 19, 1960 (62 y.o. [name] Primary Care Provider: [name] Other Clinician: Massie Kluver Referring Provider: Treating Provider/Extender: Galen Manila Weeks in Treatment: 3 Information Obtained from: Patient Chief Complaint 04/04/2022; right posterior leg wound and sacral wound Electronic Signature(s) Signed: 04/25/2022 3:44:17 PM By: Kalman Shan DO Entered By: Kalman Shan on 04/25/2022 15:14:27 -------------------------------------------------------------------------------- HPI Details Patient Name: Date of Service: Tricia Nian [name] 04/25/2022 2:45 PM Medical Record Number: [id number] Patient Account Number: [id number] Date of Birth/Sex: Treating RN: [birth year] (62 y.o. [name] Primary Care Provider: [name] Other Clinician: Massie Kluver Referring Provider: Treating Provider/Extender: Galen Manila Weeks in Treatment: 3 History of Present Illness HPI Description: 04/04/2022 Tricia [name] is a 62 year old female with a past medical history of controlled type 2 diabetes, COPD, chronic diastolic heart failure, and Parkinson's disease that presents the clinic for a 36-monthhistory of nonhealing ulcer to the sacrum and to the posterior right leg. She is not sure how these wounds developed. She can ambulate. She has been using Silvadene to the sacral wound and Neosporin to the right leg wound. She denies signs of infection. 2/21; patient presents for follow-up. She has been using AandE ointment to the sacral wound and Xeroform to the right posterior leg wound. She has no  issues or complaints today. There is been improvement in wound healing. 3/6; patient presents for follow-up. She has been using AandD ointment to the sacral wound and Xeroform to the right posterior leg wound. Her wounds have healed. Electronic Signature(s) Signed: 04/25/2022 3:44:17 PM By: HKalman ShanDO Entered By: HKalman Shanon 04/25/2022 15:14:55 RDelana Meyer([id number] Page 2 of 6 -------------------------------------------------------------------------------- Physical Exam Details Patient Name: Date of Service: RBaird Lyons3/07/2022 2:45 PM Medical Record Number: [id number] Account Number: [id number] of Birth/Sex: Treating RN: 704-11-1960(61y.o. FMarlowe ShoresPrimary Care Provider: SSteele SizerOther Clinician: VMassie KluverReferring Provider: Treating Provider/Extender: HGalen ManilaWeeks in Treatment: 3 Constitutional . Cardiovascular . Psychiatric . Notes Right posterior thigh: Epithelialization to the previous wound site. No signs of surrounding infection. T the sacral region there is a thin layer of epithelialization o to the previous wound site. Again no signs of surrounding infection. Electronic Signature(s) Signed: 04/25/2022 3:44:17 PM By: HKalman ShanDO Entered By: HKalman Shanon 04/25/2022 15:15:58 -------------------------------------------------------------------------------- Physician Orders Details Patient Name: Date of Service: Tricia NianBETH L. 04/25/2022 2:45 PM Medical Record Number: [id number] Account Number: [id number] of Birth/Sex: Treating RN: 723-Jul-1962(62y.o. FMarlowe ShoresPrimary Care Provider: SSteele SizerOther Clinician: VMassie KluverReferring Provider: Treating Provider/Extender: HDonnelly Angelicain Treatment: 3 Verbal / Phone Orders: No Diagnosis Coding [facility] [place] [place] Additional Orders / Instructions Other: - cover with bandaid or pad for protection for the next severa weeks Electronic Signature(s) Signed: 04/25/2022 3:44:17 PM By: HGerre Couch([id number] Page 3 of 6 Signed: 04/25/2022 3:44:17 PM By: HKalman ShanDO Entered By: HKalman Shanon 04/25/2022 15:18:24 -------------------------------------------------------------------------------- Problem List Details Patient Name: Date of Service: Tricia NianBETH L. 04/25/2022 2:45 PM  Medical Record Number: [id number] Patient Account Number: [id number] Date of Birth/Sex: Treating RN: 07/14/1960 (62 y.o. [name] Primary Care Provider: [name] Other Clinician: Massie Kluver Referring Provider: Treating Provider/Extender: Galen Manila Weeks in Treatment: 3 Active Problems ICD-10 Encounter Code Description Active Date MDM Diagnosis L89.152 Pressure ulcer of sacral region, stage 2 04/04/2022 No Yes L97.812 Non-pressure chronic ulcer of other part of right lower leg with fat layer 04/04/2022 No Yes exposed E11.622 Type 2 diabetes mellitus with other skin ulcer 04/04/2022 No Yes E43 Unspecified severe protein-calorie malnutrition 04/04/2022 No Yes Inactive Problems Resolved Problems Electronic Signature(s) Signed: 04/25/2022 3:44:17 PM By: Kalman Shan DO Entered By: Kalman Shan on 04/25/2022 15:14:23 -------------------------------------------------------------------------------- Progress Note Details Patient Name: Date of Service: Tricia Nian [name] 04/25/2022 2:45 PM Medical Record Number: [id number] Patient Account Number: [id number] Date of Birth/Sex: Treating RN: 09-Aug-1960 (62 y.o. [name], [place] ([id number]) [phone].pdf Page 4 of 6 Primary Care  Provider: [name] Other Clinician: Massie Kluver Referring Provider: Treating Provider/Extender: Donnelly [name]: 3 Subjective Chief Complaint Information obtained from Patient 04/04/2022; right posterior leg wound and sacral wound History of Present Illness (HPI) 04/04/2022 Tricia [name] is a 62 year old female with a past medical history of controlled type 2 diabetes, COPD, chronic diastolic heart failure, and Parkinson's disease that presents the clinic for a 45-monthhistory of nonhealing ulcer to the sacrum and to the posterior right leg. She is not sure how these wounds developed. She can ambulate. She has been using Silvadene to the sacral wound and Neosporin to the right leg wound. She denies signs of infection. 2/21; patient presents for follow-up. She has been using AandE ointment to the sacral wound and Xeroform to the right posterior leg wound. She has no issues or complaints today. There is been improvement in wound healing. 3/6; patient presents for follow-up. She has been using AandD ointment to the sacral wound and Xeroform to the right posterior leg wound. Her wounds have healed. Objective Constitutional Vitals Time Taken: 2:46 PM, Height: 68 in, Weight: 137 lbs, BMI: 20.8, Temperature: 98.3 F, Pulse: 91 bpm, Respiratory Rate: 16 breaths/min, Blood Pressure: 121/76 mmHg. General Notes: Right posterior thigh: Epithelialization to the previous wound site. No signs of surrounding infection. T the sacral region there is a thin layer of o epithelialization to the previous wound site. Again no signs of surrounding infection. Integumentary (Hair, Skin) Wound #1 status is Healed - Epithelialized. Original cause of wound was Gradually Appeared. The date acquired was: 02/19/2022. The wound has been in treatment 3 weeks. The wound is located on the Right,Posterior Upper Leg. The wound measures 0cm length x 0cm width x 0cm depth;  0cm^2 area and 0cm^3 volume. There is a none present amount of drainage noted. There is no granulation within the wound bed. There is no necrotic tissue within the wound bed. Wound #2 status is Healed - Epithelialized. Original cause of wound was Pressure Injury. The date acquired was: 01/19/2022. The wound has been in treatment 3 weeks. The wound is located on the Coccyx. The wound measures 0cm length x 0cm width x 0cm depth; 0cm^2 area and 0cm^3 volume. There is Fat Layer (Subcutaneous Tissue) exposed. There is a none present amount of drainage noted. There is no granulation within the wound bed. There is no necrotic tissue within the wound bed. 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 Her right posterior lower extremity wound and sacral wound have healed. I recommended she aggressively offload the sacral area for the next 1 to 2 weeks to help further healing. She knows to call with any questions or concerns. Plan 1. Discharge from clinic due to closed wound 2. Follow-up as needed Electronic Signature(s) Signed: 04/25/2022 3:44:17 PM By: Kalman Shan DO Entered By: Kalman Shan on 04/25/2022 15:18:00 [name] ([id number]) [id number] Page 5 of 6 -------------------------------------------------------------------------------- ROS/PFSH Details Patient Name: Date of Service: Tricia [name] 04/25/2022 2:45 PM Medical Record Number: [id number] Patient Account Number: [id number] Date of Birth/Sex: Treating RN: 21-Jan-1961 (62 y.o. [name] Primary Care Provider: [name] Other Clinician: Massie Kluver Referring Provider: Treating Provider/Extender: Galen Manila Weeks in Treatment: 3 Respiratory Medical History: Positive for: Chronic Obstructive Pulmonary Disease (COPD) Cardiovascular Medical History: Positive for: Congestive Heart Failure; Hypertension Endocrine Medical History: Positive for: Type II Diabetes Time with diabetes: 10 Treated with: Insulin, Oral agents Immunizations Pneumococcal Vaccine: Received Pneumococcal Vaccination: Yes Received Pneumococcal Vaccination On or After 60th Birthday: Yes Implantable Devices No devices added Family and Social History Current every day smoker; Marital Status - Divorced; Alcohol Use: Never; Drug Use: No History; Caffeine Use: Daily Electronic Signature(s) Signed: 04/25/2022 3:44:17 PM By: Kalman Shan DO Signed: 04/25/2022 9:49:41 PM By: Gretta Cool, BSN, RN, CWS, Kim RN, BSN Entered By: Kalman Shan on 04/25/2022 15:18:35 -------------------------------------------------------------------------------- [place] Details Patient Name: Date of Service: Tricia Nian [name] 04/25/2022 Medical Record Number: [id number] Patient Account Number: [id number] SOLVEIG, MAIR ([id number]) [id number] Page 6 of 6 Date of Birth/Sex: Treating RN: 02/06/1961 (62 y.o. [name] Primary Care Provider: [name] Other Clinician: Massie Kluver Referring Provider: Treating Provider/Extender: Galen Manila Weeks in Treatment: 3 Diagnosis Coding ICD-10 Codes Code Description [id number] Pressure ulcer of sacral region, stage 2 L97.812 Non-pressure chronic ulcer of other part of right lower leg with fat layer exposed E11.622 Type 2 diabetes mellitus with other skin ulcer E43 Unspecified severe protein-calorie malnutrition Facility Procedures : CPT4 Code: [id number] Description: [id number] - WOUND CARE VISIT-LEV 3 EST PT Modifier: Quantity: 1 Electronic Signature(s) Signed: 04/25/2022 3:44:17 PM By: Kalman Shan  DO Signed: 04/25/2022 4:49:21 PM By: Massie Kluver Entered By: Massie Kluver on 04/25/2022 15:18:24

## 2022-05-08 NOTE — Progress Notes (Unsigned)
Name: Tricia [name]   MRN: [id number]    DOB: Jun 20, 1960   Date:05/09/2022       Progress Note  Subjective  Chief Complaint  Follow Up  HPI  Protein malnutrition: weight loss, without dieting She went to see GI and had EGD 11/22 , but she refused colonoscopy. She has not scheduled her mammogram yet.  She had CT abdomen that showed fatty liver , admitted for CAP with sepsis in July 2023 .  She also needs a pap smear. She has gained weight since last visit , she states appetite is better.   Parkinson's: she was diagnosed in 2021 by Dr. Trena Platt PA  with right hemibody parkinson's . She is taking Sinemet IR. She states still has balanced problems and tremors of both hands . She was supposed to have PT but never heard from facility, advised her to contact Dr. Manuella Ghazi , she missed last visit with him    Diabetes type II ,currently Synjardi   A1C today is down from 6.2 % to 5.4 %   She has dyslipidemia, and microalbuminuria.  She denies polyphagia, polydipsia or polyuria.  She has associated dyslipidemia  and CKI , albuminuria on SGL-2 agonist We will stop metformin today, switch from Synjardi to [place]. She has been off Lantus    Chronic pain: sees pain clinic, currently going to [facility], she states pain is still up and down, worse pain is on lumbar spine. She does not sleep well and has fallen asleep while sitting on the toilet, advised to discuss it with pain clinic, also needs to see psychiatrist to manage her sleep   Chronic nausea/gastritis/barrett's she has nausea, due for follow up with GI, advised to ask them for refill of promethazine, she states not longer working for nausea, but she also states zofran did not work in the past, she needs to contact GI again   Gout: she has flares intermittently, takes colchicine prn Unchanged    Asthma she also has  chronic airflow limitation/centrolobular emphysema :  under the care of Dr. Raul Del. She had CT repeat CT 08/2021 , found  to have ground glass changes and will follow up with him soon, she is taking medication given by pulmonologist - she is going back on March 27 th for a repeat study    08/2021  1. Lung-RADS 0, incomplete. Additional lung cancer screening CT images/or comparison to prior chest CT examinations is needed.   Extensive mid and lower lung zone predominant peribronchovascular ground-glass, nodularity and endobronchial debris, new from prior and most indicative of an infectious bronchiolitis. Recommend repeat low-dose lung cancer screening CT in 4-6 weeks, after appropriate therapy.   These results will be called to the ordering clinician or representative by the Radiologist Assistant, and communication documented in the PACS or [employer]. 2. Marked hepatic steatosis. 3. Aortic atherosclerosis (ICD10-I70.0). Coronary artery calcification. 4.  Emphysema (ICD10-J43.9).  Thyroid nodule: had US done by Dr. Manfred Shirts at [facility] clinic 06/2019, she states they decided not to do a biopsy , repeat US in 22 did not recommend repeat biopsy , small lymphonodo on left side, seems reactive, she did not see Endo as planned - she states she saw someone but not sure who it was. I don't see any records   Major Depression: chronic and recurrent, phq 9 is high, she was under the care of Dr. [name] but lost to follow up . She recently lost her boyfriend of 20 plus years, he left  her a lot of debt and not money to pay for it, she finally sold her house and moved in with her daughter and son in [place]. Her daughter died at age 37 in 2023/01/10. She is still living at the same place. She is having problems sleeping, falls asleep while sitting down during the day, fell from the toilet seat. Advised to follow up with psychiatrist to manage her day time somnolence. She is willing to see another psychiatrist    CHF: doing well at this time,  she has orthopnea - she uses two pillows . Dr. Ubaldo Glassing retired, but needs to follow up with  [facility] cardiology, she is on lasix  and SGL2 agonist since 08/22. Tolerating it well. She is not on ARB or ACE due to low bp. She is on statin therapy and denies side effects   Echo from 06/05/2019  NORMAL LEFT VENTRICULAR SYSTOLIC FUNCTION  NORMAL RIGHT VENTRICULAR SYSTOLIC FUNCTION  MILD VALVULAR REGURGITATION (See above)  NO VALVULAR STENOSIS  Closest EF: >55% (Estimated)  Aortic: MILD AR  Mitral: MILD MR  Tricuspid: MILD TR  Echo showed enlarged left and right atrium   Atherosclerosis of Aorta: discussed CT , she is now taking statin therapy and last LDL has been at goal with medication. Unchanged   Senile purpura: reassurance given, Stable   Patient Active Problem List   Diagnosis Date Noted   Sacral wound 02/21/2022   Wound of right leg, initial encounter 02/21/2022   Pressure injury of sacral region, stage 1 02/21/2022   Nausea 09/08/2021   Hypoalbuminemia due to protein-calorie malnutrition ([place]) 09/08/2021   Lumbar herniated disc 07/11/2021   Calculus of gallbladder without cholecystitis without obstruction 07/11/2021   Senile purpura ([place]) 06/05/2021   Atherosclerosis of aorta ([place]) 06/05/2021   Parkinson's disease 06/05/2021   Chronic kidney disease (CKD) stage G3a/A2, moderately decreased glomerular filtration rate (GFR) between 45-59 mL/min/1.73 square meter and albuminuria creatinine ratio between 30-299 mg/g ([place]) 06/05/2021   Esophageal dysphagia    Gastric erythema    Columnar-lined esophagus    Moderate malnutrition ([place]) 09/26/2020   Centrilobular emphysema ([place]) 03/30/2020   History of prolonged Q-T interval on ECG 11/18/2019   GAD (generalized anxiety disorder) 10/13/2019   MDD (major depressive disorder), recurrent episode, moderate ([place]) 10/13/2019   At risk for long QT syndrome 10/13/2019   Nonrheumatic mitral valve regurgitation 05/04/2019   Benign neoplasm of descending colon    Polyp of sigmoid colon    Polyneuropathy 10/21/2014   Chronic  venous insufficiency 10/05/2014   Bilateral leg edema 08/16/2014   Major depression in partial remission ([place]) 08/16/2014   Acid reflux 08/16/2014   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   Asthma, moderate persistent 08/16/2014   Carpal tunnel syndrome 08/16/2014   Cervical pain 08/16/2014   CAFL (chronic airflow limitation) ([place]) 08/16/2014   Type 2 diabetes mellitus with peripheral neuropathy ([place]) 08/16/2014   Diabetes mellitus type 2, insulin dependent ([place]) 08/16/2014   Dyslipidemia 08/16/2014   Tobacco use disorder 08/16/2014   Essential (primary) hypertension 08/16/2014   Benign neoplasm of stomach 08/16/2014   Gout 08/16/2014   Mixed hyperlipidemia 08/16/2014   Low back pain 08/16/2014   Lumbar radiculopathy 08/16/2014   Headache, migraine 08/16/2014   Arthralgia of multiple joints 08/16/2014   Vitamin D deficiency 08/16/2014   Primary osteoarthritis of both knees 06/22/2014   Benign essential tremor 10/02/2013   Cervical dystonia 10/02/2013   Chronic diastolic heart failure ([place]) 11/16/2012   Chronic pain  11/13/2012    Past Surgical History:  Procedure Laterality Date   CARPAL TUNNEL RELEASE Bilateral    x2 right, 1x on left   COLONOSCOPY     COLONOSCOPY WITH PROPOFOL N/A 04/25/2017   Procedure: COLONOSCOPY WITH PROPOFOL;  Surgeon: [name], MD;  Location: [facility];  Service: Endoscopy;  Laterality: N/A;  diabetic-oral med   DILATION AND CURETTAGE OF UTERUS     ESOPHAGOGASTRODUODENOSCOPY (EGD) WITH PROPOFOL N/A 01/10/2021   Procedure: ESOPHAGOGASTRODUODENOSCOPY (EGD) WITH PROPOFOL;  Surgeon: Virgel Manifold, MD;  Location: [place];  Service: Endoscopy;  Laterality: N/A;  Diabetic   EXTERNAL EAR SURGERY Left    x2   POLYPECTOMY  04/25/2017   Procedure: POLYPECTOMY INTESTINAL;  Surgeon: [name], MD;  Location: [place];  Service: Endoscopy;;   SPINE SURGERY     herniated disc   TUBAL LIGATION      Family History   Problem Relation Age of Onset   Emphysema Mother    Anxiety disorder Mother    Stroke Father    Throat cancer Father    Lung cancer Maternal Grandmother    Lung cancer Maternal Grandfather    Hypertension Daughter    Diabetes Daughter    Multiple sclerosis Daughter    Bipolar disorder Daughter    Cervical cancer Daughter    Bipolar disorder Daughter    Drug abuse Daughter    Lung cancer Maternal Aunt    Lung cancer Maternal Uncle     Social History   Tobacco Use   Smoking status: Every Day    Packs/day: 1.00    Years: 41.00    Additional pack years: 0.00    Total pack years: 41.00    Types: Cigarettes    Start date: 05/19/1977   Smokeless tobacco: Never  Substance Use Topics   Alcohol use: No    Alcohol/week: 0.0 standard drinks of alcohol     Current Outpatient Medications:    budesonide-formoterol (SYMBICORT) 160-4.5 MCG/ACT inhaler, INHALE 2 PUFFS TWICE A DAY RINSE MOUTH WITH WATER AFTER EACH USE, Disp: 10.2 g, Rfl: 2   cetirizine (ZYRTEC) 10 MG tablet, Take 10 mg by mouth daily as needed for allergies., Disp: , Rfl:    colchicine 0.6 MG tablet, TAKE 1 TABLET BY MOUTH ONCE DAILY, Disp: 30 tablet, Rfl: 0   cyclobenzaprine (FLEXERIL) 10 MG tablet, Take 10 mg by mouth at bedtime., Disp: , Rfl:    DULoxetine (CYMBALTA) 60 MG capsule, Take 1 capsule (60 mg total) by mouth daily., Disp: 90 capsule, Rfl: 0   empagliflozin (JARDIANCE) 25 MG TABS tablet, Take 1 tablet (25 mg total) by mouth daily before breakfast. In place of Synjardi, Disp: 90 tablet, Rfl: 1   fluticasone (FLONASE) 50 MCG/ACT nasal spray, Place 2 sprays into both nostrils daily., Disp: 48 g, Rfl: 0   furosemide (LASIX) 40 MG tablet, Take 1 tablet (40 mg total) by mouth daily as needed., Disp: 30 tablet, Rfl: 2   gabapentin (NEURONTIN) 600 MG tablet, Take 1,200 mg by mouth 3 (three) times daily., Disp: , Rfl:    ipratropium (ATROVENT) 0.06 % nasal spray, Place 2 sprays into both nostrils 4 (four) times daily.,  Disp: 45 mL, Rfl: 0   ipratropium-albuterol (DUONEB) 0.5-2.5 (3) MG/3ML SOLN, Inhale 3 mLs into the lungs every 6 (six) hours as needed., Disp: 360 mL, Rfl: 3   levocetirizine (XYZAL) 5 MG tablet, TAKE 1 TABLET BY MOUTH ONCE EVERY EVENING, Disp: 90 tablet, Rfl: 1  lidocaine (LIDODERM) 5 %, 1 patch every 12 (twelve) hours as needed., Disp: , Rfl:    lubiprostone (AMITIZA) 24 MCG capsule, Take by mouth 2 (two) times daily as needed., Disp: , Rfl:    montelukast (SINGULAIR) 10 MG tablet, Take 1 tablet by mouth at bedtime., Disp: , Rfl:    morphine (MS CONTIN) 30 MG 12 hr tablet, Take 30 mg by mouth every 12 (twelve) hours., Disp: , Rfl:    morphine (MSIR) 15 MG tablet, Take 15 mg by mouth., Disp: , Rfl:    omeprazole (PRILOSEC) 40 MG capsule, Take 1 capsule (40 mg total) by mouth daily., Disp: 90 capsule, Rfl: 0   Oxycodone HCl 10 MG TABS, Take 10 mg by mouth 4 (four) times daily as needed., Disp: , Rfl:    promethazine (PHENERGAN) 25 MG tablet, Take 25 mg by mouth daily as needed., Disp: , Rfl:    QUEtiapine (SEROQUEL) 25 MG tablet, Take 1 tablet (25 mg total) by mouth at bedtime., Disp: 90 tablet, Rfl: 0   rizatriptan (MAXALT-MLT) 10 MG disintegrating tablet, Take by mouth., Disp: , Rfl:    rosuvastatin (CRESTOR) 5 MG tablet, TAKE 1 TABLET BY MOUTH AT BEDTIME, Disp: 90 tablet, Rfl: 0   theophylline (UNIPHYL) 400 MG 24 hr tablet, Take 1 tablet by mouth daily. , Disp: , Rfl:    tiotropium (SPIRIVA) 18 MCG inhalation capsule, Place 1 capsule into inhaler and inhale daily. pm, Disp: , Rfl:    VENTOLIN HFA 108 (90 Base) MCG/ACT inhaler, INHALE 1 PUFF BY MOUTH AS NEEDED, Disp: 18 g, Rfl: 0   carbidopa-levodopa (SINEMET IR) 25-100 MG tablet, Take 1.5 tablets by mouth 3 (three) times daily., Disp: , Rfl:   Allergies  Allergen Reactions   Augmentin [Amoxicillin-Pot Clavulanate] Diarrhea   Penicillins Itching    I personally reviewed active problem list, medication list, allergies, family history,  social history, health maintenance with the patient/caregiver today.   ROS  Ten systems reviewed and is negative except as mentioned in HPI   Objective  Vitals:   05/09/22 1501  BP: 124/70  Pulse: 94  Resp: 16  SpO2: 95%  Weight: 135 lb (61.2 kg)  Height: 5\' 8"  (1.727 m)    Body mass index is 20.53 kg/m.  Physical Exam  Constitutional: Patient appears well-developed and malnourished No distress.  HEENT: head atraumatic, normocephalic, pupils equal and reactive to light, neck supple Cardiovascular: Normal rate, regular rhythm and normal heart sounds.  No murmur heard. No BLE edema. Pulmonary/Chest: Effort normal and breath sounds normal. No respiratory distress. Abdominal: Soft.  There is no tenderness. Skin: senile purpura  Psychiatric: Patient has a normal mood and affect. behavior is normal. Judgment and thought content normal.   Recent Results (from the past 2160 hour(s))  POCT HgB A1C     Status: None   Collection Time: 05/09/22  3:02 PM  Result Value Ref Range   Hemoglobin A1C 5.4 4.0 - 5.6 %   HbA1c POC (<> result, manual entry)     HbA1c, POC (prediabetic range)     HbA1c, POC (controlled diabetic range)       PHQ2/9:    05/09/2022    3:00 PM 02/21/2022   11:11 AM 01/31/2022    9:20 AM 01/16/2022   11:05 AM 12/27/2021    2:40 PM  Depression screen PHQ 2/9  Decreased Interest 1 0 3 3 3   Down, Depressed, Hopeless 1 0 3 3 3   PHQ - 2 Score 2 0  6 6 6   Altered sleeping 3 0 3 3 3   Tired, decreased energy 3 0 3 3 3   Change in appetite 0 0 3 3 3   Feeling bad or failure about yourself  0 0 0 2 3  Trouble concentrating 3 0 3 3 3   Moving slowly or fidgety/restless 0 0 0 2 0  Suicidal thoughts 0 0 0 0 0  PHQ-9 Score 11 0 18 22 21   Difficult doing work/chores  Not difficult at all Very difficult Very difficult Very difficult    phq 9 is positive   Fall Risk:    05/09/2022    2:59 PM 02/21/2022   11:11 AM 01/31/2022    9:19 AM 01/16/2022   11:04 AM 12/27/2021     2:39 PM  Fall Risk   Falls in the past year? 1 1 1 1 1   Number falls in past yr: 0 1 1 1 1   Injury with Fall? 0 1 1 0 0  Risk for fall due to : No Fall Risks Impaired balance/gait Impaired balance/gait;History of fall(s) History of fall(s) Impaired balance/gait;Other (Comment)  Risk for fall due to: Comment     Parkinson's- Loss of balance  Follow up Falls prevention discussed Falls prevention discussed;Education provided;Falls evaluation completed Falls prevention discussed;Education provided;Falls evaluation completed Falls prevention discussed;Falls evaluation completed;Education provided Falls prevention discussed      Functional Status Survey: Is the patient deaf or have difficulty hearing?: Yes Does the patient have difficulty seeing, even when wearing glasses/contacts?: No Does the patient have difficulty concentrating, remembering, or making decisions?: Yes Does the patient have difficulty walking or climbing stairs?: Yes Does the patient have difficulty dressing or bathing?: No Does the patient have difficulty doing errands alone such as visiting a doctor's office or shopping?: No    Assessment & Plan  1. Type 2 diabetes mellitus with peripheral neuropathy (HCC) *** - POCT HgB A1C - empagliflozin (JARDIANCE) 25 MG TABS tablet; Take 1 tablet (25 mg total) by mouth daily before breakfast. In place of Synjardi  Dispense: 90 tablet; Refill: 1  2. MDD (major depressive disorder), recurrent episode, moderate (HCC) *** - Ambulatory referral to Psychiatry  3. GAD (generalized anxiety disorder) *** - Ambulatory referral to Psychiatry  4. Centrilobular emphysema (HCC) ***  5. Atherosclerosis of aorta (HCC) ***  6. Senile purpura (HCC) ***  7. Moderate malnutrition (HCC) ***  8. Chronic diastolic heart failure (HCC) ***  9. Parkinson's disease without dyskinesia or fluctuating manifestations ***  10. Vitamin D deficiency ***  11. Breast cancer screening by  mammogram *** - MM 3D SCREENING MAMMOGRAM BILATERAL BREAST; Future

## 2022-05-09 ENCOUNTER — Encounter: Payer: Self-pay | Admitting: Family Medicine

## 2022-05-09 ENCOUNTER — Ambulatory Visit (INDEPENDENT_AMBULATORY_CARE_PROVIDER_SITE_OTHER): Payer: 59 | Admitting: Family Medicine

## 2022-05-09 VITALS — BP 124/70 | HR 94 | Resp 16 | Ht 68.0 in | Wt 135.0 lb

## 2022-05-09 DIAGNOSIS — F411 Generalized anxiety disorder: Secondary | ICD-10-CM | POA: Diagnosis not present

## 2022-05-09 DIAGNOSIS — E559 Vitamin D deficiency, unspecified: Secondary | ICD-10-CM | POA: Diagnosis not present

## 2022-05-09 DIAGNOSIS — E440 Moderate protein-calorie malnutrition: Secondary | ICD-10-CM

## 2022-05-09 DIAGNOSIS — Z1231 Encounter for screening mammogram for malignant neoplasm of breast: Secondary | ICD-10-CM | POA: Diagnosis not present

## 2022-05-09 DIAGNOSIS — I5032 Chronic diastolic (congestive) heart failure: Secondary | ICD-10-CM | POA: Diagnosis not present

## 2022-05-09 DIAGNOSIS — E1142 Type 2 diabetes mellitus with diabetic polyneuropathy: Secondary | ICD-10-CM | POA: Diagnosis not present

## 2022-05-09 DIAGNOSIS — F331 Major depressive disorder, recurrent, moderate: Secondary | ICD-10-CM | POA: Diagnosis not present

## 2022-05-09 DIAGNOSIS — I700 Atherosclerosis of aorta: Secondary | ICD-10-CM | POA: Diagnosis not present

## 2022-05-09 DIAGNOSIS — J432 Centrilobular emphysema: Secondary | ICD-10-CM

## 2022-05-09 DIAGNOSIS — D692 Other nonthrombocytopenic purpura: Secondary | ICD-10-CM | POA: Diagnosis not present

## 2022-05-09 DIAGNOSIS — G20A1 Parkinson's disease without dyskinesia, without mention of fluctuations: Secondary | ICD-10-CM

## 2022-05-09 LAB — POCT GLYCOSYLATED HEMOGLOBIN (HGB A1C): Hemoglobin A1C: 5.4 % (ref 4.0–5.6)

## 2022-05-09 MED ORDER — SYNJARDY 12.5-500 MG PO TABS: 1.0000 | ORAL_TABLET | Freq: Two times a day (BID) | ORAL | 1 refills | Status: DC

## 2022-05-09 MED ORDER — EMPAGLIFLOZIN 25 MG PO TABS: 25.0000 mg | ORAL_TABLET | Freq: Every day | ORAL | 1 refills | Status: DC

## 2022-05-16 ENCOUNTER — Ambulatory Visit
Admission: RE | Admit: 2022-05-16 | Discharge: 2022-05-16 | Disposition: A | Discharge status: Home / Self Care | Payer: 59 | Source: Ambulatory Visit | Attending: Specialist | Admitting: Specialist

## 2022-05-16 DIAGNOSIS — R053 Chronic cough: Secondary | ICD-10-CM | POA: Diagnosis not present

## 2022-05-16 DIAGNOSIS — R918 Other nonspecific abnormal finding of lung field: Secondary | ICD-10-CM | POA: Diagnosis not present

## 2022-05-16 DIAGNOSIS — J439 Emphysema, unspecified: Secondary | ICD-10-CM | POA: Diagnosis not present

## 2022-05-23 DIAGNOSIS — J449 Chronic obstructive pulmonary disease, unspecified: Secondary | ICD-10-CM | POA: Diagnosis not present

## 2022-05-24 DIAGNOSIS — Z9981 Dependence on supplemental oxygen: Secondary | ICD-10-CM | POA: Diagnosis not present

## 2022-05-24 DIAGNOSIS — J449 Chronic obstructive pulmonary disease, unspecified: Secondary | ICD-10-CM | POA: Diagnosis not present

## 2022-05-24 DIAGNOSIS — R0602 Shortness of breath: Secondary | ICD-10-CM | POA: Diagnosis not present

## 2022-05-24 DIAGNOSIS — R911 Solitary pulmonary nodule: Secondary | ICD-10-CM | POA: Diagnosis not present

## 2022-05-24 DIAGNOSIS — F17218 Nicotine dependence, cigarettes, with other nicotine-induced disorders: Secondary | ICD-10-CM | POA: Diagnosis not present

## 2022-05-28 DIAGNOSIS — J449 Chronic obstructive pulmonary disease, unspecified: Secondary | ICD-10-CM | POA: Diagnosis not present

## 2022-05-30 DIAGNOSIS — R911 Solitary pulmonary nodule: Secondary | ICD-10-CM | POA: Diagnosis not present

## 2022-05-31 DIAGNOSIS — M5416 Radiculopathy, lumbar region: Secondary | ICD-10-CM | POA: Diagnosis not present

## 2022-05-31 DIAGNOSIS — Z79891 Long term (current) use of opiate analgesic: Secondary | ICD-10-CM | POA: Diagnosis not present

## 2022-05-31 DIAGNOSIS — K5903 Drug induced constipation: Secondary | ICD-10-CM | POA: Diagnosis not present

## 2022-05-31 DIAGNOSIS — G894 Chronic pain syndrome: Secondary | ICD-10-CM | POA: Diagnosis not present

## 2022-05-31 DIAGNOSIS — M25569 Pain in unspecified knee: Secondary | ICD-10-CM | POA: Diagnosis not present

## 2022-05-31 DIAGNOSIS — R519 Headache, unspecified: Secondary | ICD-10-CM | POA: Diagnosis not present

## 2022-05-31 DIAGNOSIS — M542 Cervicalgia: Secondary | ICD-10-CM | POA: Diagnosis not present

## 2022-06-05 ENCOUNTER — Encounter: Payer: 59 | Admitting: Family Medicine

## 2022-06-07 ENCOUNTER — Other Ambulatory Visit: Payer: Self-pay | Admitting: Family Medicine

## 2022-06-07 DIAGNOSIS — J302 Other seasonal allergic rhinitis: Secondary | ICD-10-CM

## 2022-06-22 DIAGNOSIS — J449 Chronic obstructive pulmonary disease, unspecified: Secondary | ICD-10-CM | POA: Diagnosis not present

## 2022-06-27 DIAGNOSIS — J449 Chronic obstructive pulmonary disease, unspecified: Secondary | ICD-10-CM | POA: Diagnosis not present

## 2022-07-03 ENCOUNTER — Encounter: Payer: 59 | Admitting: Family Medicine

## 2022-07-07 ENCOUNTER — Other Ambulatory Visit: Payer: Self-pay | Admitting: Family Medicine

## 2022-07-07 DIAGNOSIS — J189 Pneumonia, unspecified organism: Secondary | ICD-10-CM

## 2022-07-09 ENCOUNTER — Other Ambulatory Visit: Payer: Self-pay

## 2022-07-09 DIAGNOSIS — J189 Pneumonia, unspecified organism: Secondary | ICD-10-CM

## 2022-07-12 DIAGNOSIS — G20C Parkinsonism, unspecified: Secondary | ICD-10-CM | POA: Diagnosis not present

## 2022-07-16 ENCOUNTER — Other Ambulatory Visit: Payer: Self-pay | Admitting: Internal Medicine

## 2022-07-16 DIAGNOSIS — F411 Generalized anxiety disorder: Secondary | ICD-10-CM

## 2022-07-16 DIAGNOSIS — F331 Major depressive disorder, recurrent, moderate: Secondary | ICD-10-CM
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
## 2022-07-18 ENCOUNTER — Other Ambulatory Visit: Payer: Self-pay

## 2022-07-18 DIAGNOSIS — F331 Major depressive disorder, recurrent, moderate: Secondary | ICD-10-CM

## 2022-07-23 ENCOUNTER — Telehealth: Payer: Self-pay

## 2022-07-23 DIAGNOSIS — J432 Centrilobular emphysema: Secondary | ICD-10-CM

## 2022-07-23 DIAGNOSIS — J449 Chronic obstructive pulmonary disease, unspecified: Secondary | ICD-10-CM | POA: Diagnosis not present

## 2022-07-23 DIAGNOSIS — I5032 Chronic diastolic (congestive) heart failure: Secondary | ICD-10-CM

## 2022-07-23 NOTE — Telephone Encounter (Signed)
PharmD reviewed patient chart to assess eligibility for [facility] Care [facility] Coordination services. Patient was determined to be a good candidate for the program given the complexity of the medication regimen and/or overall risk for hospitalization and increased utilization.  Referral entered in order to outreach patient and offer appointment with PharmD. Referral cosigned to PCP.  

## 2022-07-23 NOTE — Telephone Encounter (Signed)
I think it might be best if we wait until your next follow-up to discuss the program with her and see if she was still interested.  Thanks so much!

## 2022-07-28 DIAGNOSIS — J449 Chronic obstructive pulmonary disease, unspecified: Secondary | ICD-10-CM | POA: Diagnosis not present

## 2022-08-08 ENCOUNTER — Other Ambulatory Visit: Payer: Self-pay | Admitting: Specialist

## 2022-08-08 DIAGNOSIS — G894 Chronic pain syndrome: Secondary | ICD-10-CM | POA: Diagnosis not present

## 2022-08-08 DIAGNOSIS — R911 Solitary pulmonary nodule: Secondary | ICD-10-CM

## 2022-08-08 DIAGNOSIS — R519 Headache, unspecified: Secondary | ICD-10-CM | POA: Diagnosis not present

## 2022-08-08 DIAGNOSIS — M5416 Radiculopathy, lumbar region: Secondary | ICD-10-CM | POA: Diagnosis not present

## 2022-08-08 DIAGNOSIS — Z79891 Long term (current) use of opiate analgesic: Secondary | ICD-10-CM | POA: Diagnosis not present

## 2022-08-08 DIAGNOSIS — R0602 Shortness of breath: Secondary | ICD-10-CM

## 2022-08-08 DIAGNOSIS — K5903 Drug induced constipation: Secondary | ICD-10-CM | POA: Diagnosis not present

## 2022-08-08 DIAGNOSIS — M542 Cervicalgia: Secondary | ICD-10-CM | POA: Diagnosis not present

## 2022-08-08 DIAGNOSIS — M25569 Pain in unspecified knee: Secondary | ICD-10-CM | POA: Diagnosis not present

## 2022-08-22 ENCOUNTER — Ambulatory Visit
Admission: RE | Admit: 2022-08-22 | Discharge: 2022-08-22 | Disposition: A | Discharge status: Home / Self Care | Payer: 59 | Source: Ambulatory Visit | Attending: Specialist | Admitting: Specialist

## 2022-08-22 DIAGNOSIS — R0602 Shortness of breath: Secondary | ICD-10-CM | POA: Diagnosis not present

## 2022-08-22 DIAGNOSIS — E041 Nontoxic single thyroid nodule: Secondary | ICD-10-CM | POA: Diagnosis not present

## 2022-08-22 DIAGNOSIS — J449 Chronic obstructive pulmonary disease, unspecified: Secondary | ICD-10-CM | POA: Diagnosis not present

## 2022-08-22 DIAGNOSIS — R918 Other nonspecific abnormal finding of lung field: Secondary | ICD-10-CM | POA: Diagnosis not present

## 2022-08-22 DIAGNOSIS — R911 Solitary pulmonary nodule: Secondary | ICD-10-CM | POA: Diagnosis not present

## 2022-08-24 ENCOUNTER — Ambulatory Visit: Payer: Self-pay | Admitting: *Deleted

## 2022-08-24 ENCOUNTER — Other Ambulatory Visit: Payer: Self-pay | Admitting: Family Medicine

## 2022-08-24 DIAGNOSIS — F331 Major depressive disorder, recurrent, moderate: Secondary | ICD-10-CM

## 2022-08-24 NOTE — Telephone Encounter (Signed)
Second attempt to contact patient:     Summary: Nausea Advice     Pt is calling to report that she has been nauseous for 3 days. Please advise          Attempted to call patient- no answer- mailbox is full- unable to leave call back message

## 2022-08-24 NOTE — Telephone Encounter (Signed)
No contact with patient x 3 to review sx of nausea x 3 days. Called [phone] no answer, unable to leave message VM full. Please advise .  Reason for Disposition  Third attempt to contact caller AND no contact made. Phone number verified.  Answer Assessment - Initial Assessment Questions N/A No contact with patient to review sx of nausea x 3 days.  Protocols used: No Contact or Duplicate Contact Call-A-AH

## 2022-08-24 NOTE — Telephone Encounter (Signed)
Summary: Nausea Advice   Pt is calling to report that she has been nauseous for 3 days. Please advise         Attempted to call patient- no answer- mailbox is full- unable to leave call back message

## 2022-08-27 DIAGNOSIS — J449 Chronic obstructive pulmonary disease, unspecified: Secondary | ICD-10-CM | POA: Diagnosis not present

## 2022-09-04 DIAGNOSIS — G43011 Migraine without aura, intractable, with status migrainosus: Secondary | ICD-10-CM | POA: Diagnosis not present

## 2022-09-04 DIAGNOSIS — G20C Parkinsonism, unspecified: Secondary | ICD-10-CM | POA: Diagnosis not present

## 2022-09-04 DIAGNOSIS — E1142 Type 2 diabetes mellitus with diabetic polyneuropathy: Secondary | ICD-10-CM | POA: Diagnosis not present

## 2022-09-04 DIAGNOSIS — G629 Polyneuropathy, unspecified: Secondary | ICD-10-CM | POA: Diagnosis not present

## 2022-09-04 DIAGNOSIS — G250 Essential tremor: Secondary | ICD-10-CM | POA: Diagnosis not present

## 2022-09-04 DIAGNOSIS — F5101 Primary insomnia: Secondary | ICD-10-CM | POA: Diagnosis not present

## 2022-09-04 DIAGNOSIS — Z720 Tobacco use: Secondary | ICD-10-CM | POA: Diagnosis not present

## 2022-09-04 DIAGNOSIS — R112 Nausea with vomiting, unspecified: Secondary | ICD-10-CM | POA: Diagnosis not present

## 2022-09-08 ENCOUNTER — Encounter: Payer: Self-pay | Admitting: Emergency Medicine

## 2022-09-08 ENCOUNTER — Ambulatory Visit
Admission: EM | Admit: 2022-09-08 | Discharge: 2022-09-08 | Disposition: A | Discharge status: Home / Self Care | Payer: 59 | Source: Home / Self Care

## 2022-09-08 DIAGNOSIS — S60562A Insect bite (nonvenomous) of left hand, initial encounter: Secondary | ICD-10-CM

## 2022-09-08 DIAGNOSIS — L03114 Cellulitis of left upper limb: Secondary | ICD-10-CM | POA: Diagnosis not present

## 2022-09-08 DIAGNOSIS — W57XXXA Bitten or stung by nonvenomous insect and other nonvenomous arthropods, initial encounter: Secondary | ICD-10-CM

## 2022-09-08 MED ORDER — METHYLPREDNISOLONE 4 MG PO TBPK: ORAL_TABLET | ORAL | 0 refills | Status: DC

## 2022-09-08 MED ORDER — CEPHALEXIN 500 MG PO CAPS: 500.0000 mg | ORAL_CAPSULE | Freq: Four times a day (QID) | ORAL | 0 refills | Status: AC

## 2022-09-08 NOTE — Discharge Instructions (Addendum)
-  You have an infection of your hand and also inflammation.  I sent medication to the pharmacy.  Also ice your hand and elevate it. - We gave you a brace to use as needed. - You may take Tylenol for pain but your symptoms should be getting better in a couple days. - If you develop a fever or your symptoms or not improving in 2 days or they worsen you should go to the ER.

## 2022-09-08 NOTE — ED Triage Notes (Signed)
Pt has hornet sting on her left hand. She noticed this about week ago. Hand is swollen, red, and warmness to the touch.

## 2022-09-08 NOTE — ED Provider Notes (Signed)
[facility]    CSN: [id number] Arrival date & time: 09/08/22  1403      History   Chief Complaint Chief Complaint  Patient presents with   Insect Bite    HPI Tricia [name] is a 62 y.o. female presenting for left hand redness, swelling and pain.  Patient reports she was stung by a hornet 1-1/2 weeks ago.  She reports the swelling had pretty much gone away until couple days ago when it worsened.  Denies any new injuries.  Has applied ice.  Patient also reports history of gout and arthritis.  Denies rheumatoid arthritis.  She has not had any fevers.  HPI  Past Medical History:  Diagnosis Date   Anxiety    Arthritis    joints and hands/ knees   Asthma    uses inhaler   Benign essential tremor    head   Cervical dystonia    neck pain   Cholesteatoma of left ear    x2   COPD (chronic obstructive pulmonary disease) (HCC)    Cough    Depression    Diabetes mellitus without complication (HCC)    type 2   Diastolic dysfunction    Dyspnea    Dysrhythmia    diastolic dysfunction   GERD (gastroesophageal reflux disease)    Headache    migraines/ one per week   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   Hyperlipidemia    Hypertension    Motion sickness    boat   Neuromuscular disorder (HCC)    neuropathy feet and hands( nerve damage)   Wears dentures    upper and lower    Patient Active Problem List   Diagnosis Date Noted   Sacral wound 02/21/2022   Wound of right leg, initial encounter 02/21/2022   Pressure injury of sacral region, stage 1 02/21/2022   Nausea 09/08/2021   Hypoalbuminemia due to protein-calorie malnutrition (HCC) 09/08/2021   Lumbar herniated disc 07/11/2021   Calculus of gallbladder without cholecystitis without obstruction 07/11/2021   Senile purpura (HCC) 06/05/2021   Atherosclerosis of aorta (HCC) 06/05/2021   Parkinson's disease 06/05/2021   Chronic kidney disease (CKD) stage G3a/A2, moderately decreased  glomerular filtration rate (GFR) between 45-59 mL/min/1.73 square meter and albuminuria creatinine ratio between 30-299 mg/g (HCC) 06/05/2021   Esophageal dysphagia    Gastric erythema    Columnar-lined esophagus    Moderate malnutrition (HCC) 09/26/2020   Centrilobular emphysema (HCC) 03/30/2020   History of prolonged Q-T interval on ECG 11/18/2019   GAD (generalized anxiety disorder) 10/13/2019   MDD (major depressive disorder), recurrent episode, moderate (HCC) 10/13/2019   At risk for long QT syndrome 10/13/2019   Nonrheumatic mitral valve regurgitation 05/04/2019   Benign neoplasm of descending colon    Polyp of sigmoid colon    Polyneuropathy 10/21/2014   Chronic venous insufficiency 10/05/2014   Bilateral leg edema 08/16/2014   Major depression in partial remission (HCC) 08/16/2014   Acid reflux 08/16/2014   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   Asthma, moderate persistent 08/16/2014   Carpal tunnel syndrome 08/16/2014   Cervical pain 08/16/2014   CAFL (chronic airflow limitation) (HCC) 08/16/2014   Type 2 diabetes mellitus with peripheral neuropathy (HCC) 08/16/2014   Diabetes mellitus type 2, insulin dependent (HCC) 08/16/2014   Dyslipidemia 08/16/2014   Tobacco use disorder 08/16/2014   Essential (primary) hypertension 08/16/2014   Benign neoplasm of stomach 08/16/2014   Gout 08/16/2014   Mixed hyperlipidemia  08/16/2014   Low back pain 08/16/2014   Lumbar radiculopathy 08/16/2014   Headache, migraine 08/16/2014   Arthralgia of multiple joints 08/16/2014   Vitamin D deficiency 08/16/2014   Primary osteoarthritis of both knees 06/22/2014   Benign essential tremor 10/02/2013   Cervical dystonia 10/02/2013   Chronic diastolic heart failure (HCC) 11/16/2012   Chronic pain 11/13/2012    Past Surgical History:  Procedure Laterality Date   CARPAL TUNNEL RELEASE Bilateral    x2 right, 1x on left   COLONOSCOPY     COLONOSCOPY WITH PROPOFOL N/A 04/25/2017    Procedure: COLONOSCOPY WITH PROPOFOL;  Surgeon: Midge Minium, MD;  Location: [facility];  Service: Endoscopy;  Laterality: N/A;  diabetic-oral med   DILATION AND CURETTAGE OF UTERUS     ESOPHAGOGASTRODUODENOSCOPY (EGD) WITH PROPOFOL N/A 01/10/2021   Procedure: ESOPHAGOGASTRODUODENOSCOPY (EGD) WITH PROPOFOL;  Surgeon: Pasty Spillers, MD;  Location: [facility];  Service: Endoscopy;  Laterality: N/A;  Diabetic   EXTERNAL EAR SURGERY Left    x2   POLYPECTOMY  04/25/2017   Procedure: POLYPECTOMY INTESTINAL;  Surgeon: Midge Minium, MD;  Location: [facility];  Service: Endoscopy;;   SPINE SURGERY     herniated disc   TUBAL LIGATION      OB History   No obstetric history on file.      Home Medications    Prior to Admission medications   Medication Sig Start Date End Date Taking? Authorizing Provider  carbidopa-levodopa (SINEMET IR) 25-100 MG tablet Take 1.5 tablets by mouth 3 (three) times daily. 01/25/20 09/08/22 Yes Lonell Face, MD  cephALEXin (KEFLEX) 500 MG capsule Take 1 capsule (500 mg total) by mouth 4 (four) times daily for 7 days. 09/08/22 09/15/22 Yes Eusebio Friendly B, PA-C  DULoxetine (CYMBALTA) 60 MG capsule TAKE 1 CAPSULE BY MOUTH ONCE DAILY 07/17/22  Yes Margarita Mail, DO  empagliflozin (JARDIANCE) 25 MG TABS tablet Take 1 tablet (25 mg total) by mouth daily before breakfast. In place of Synjardi 05/09/22  Yes Sowles, Danna Hefty, MD  fluticasone (FLONASE) 50 MCG/ACT nasal spray Place 2 sprays into both nostrils daily. 10/25/21  Yes Sowles, Danna Hefty, MD  furosemide (LASIX) 40 MG tablet Take 1 tablet (40 mg total) by mouth daily as needed. 06/21/17  Yes [name], Percell Belt, NP  gabapentin (NEURONTIN) 600 MG tablet Take 1,200 mg by mouth 3 (three) times daily.   Yes [provider]  levocetirizine (XYZAL) 5 MG tablet TAKE 1 TABLET BY MOUTH ONCE EVERY EVENING 06/07/22  Yes Sowles, Danna Hefty, MD  methylPREDNISolone (MEDROL DOSEPAK) 4 MG TBPK tablet Take  po according to dosepack 09/08/22  Yes Shirlee Latch, PA-C  morphine (MSIR) 15 MG tablet Take 15 mg by mouth.   Yes [provider]  omeprazole (PRILOSEC) 40 MG capsule Take 1 capsule (40 mg total) by mouth daily. 11/03/20  Yes Sowles, Danna Hefty, MD  Oxycodone HCl 10 MG TABS Take 10 mg by mouth 4 (four) times daily as needed. 10/12/19  Yes [provider]  QUEtiapine (SEROQUEL) 25 MG tablet TAKE 1 TABLET BY MOUTH AT BEDTIME 08/25/22  Yes Sowles, Danna Hefty, MD  rosuvastatin (CRESTOR) 5 MG tablet TAKE 1 TABLET BY MOUTH AT BEDTIME 03/26/22  Yes Sowles, Danna Hefty, MD  SYMBICORT 160-4.5 MCG/ACT inhaler INHALE 2 PUFFS TWICE A DAY RINSE MOUTH WITH WATER AFTER EACH USE 07/09/22  Yes Sowles, Danna Hefty, MD  tiotropium (SPIRIVA) 18 MCG inhalation capsule Place 1 capsule into inhaler and inhale daily. pm 08/27/13  Yes Meredeth Ide,  Herbon E, MD  VENTOLIN HFA 108 (90 Base) MCG/ACT inhaler INHALE 1 PUFF BY MOUTH AS NEEDED 04/26/20  Yes Sowles, Danna Hefty, MD  cetirizine (ZYRTEC) 10 MG tablet Take 10 mg by mouth daily as needed for allergies.    [provider]  colchicine 0.6 MG tablet TAKE 1 TABLET BY MOUTH ONCE DAILY 10/27/20   Alba Cory, MD  cyclobenzaprine (FLEXERIL) 10 MG tablet Take 10 mg by mouth at bedtime. 08/01/21   [provider]  ipratropium (ATROVENT) 0.06 % nasal spray Place 2 sprays into both nostrils 4 (four) times daily. 10/25/21   Alba Cory, MD  ipratropium-albuterol (DUONEB) 0.5-2.5 (3) MG/3ML SOLN Inhale 3 mLs into the lungs every 6 (six) hours as needed. 06/21/17   [name], Percell Belt, NP  lidocaine (LIDODERM) 5 % 1 patch every 12 (twelve) hours as needed. 02/02/20   [provider]  lubiprostone (AMITIZA) 24 MCG capsule Take by mouth 2 (two) times daily as needed. 02/05/20   [provider]  montelukast (SINGULAIR) 10 MG tablet Take 1 tablet by mouth at bedtime. 02/21/21   Mertie Moores, MD  morphine (MS CONTIN) 30 MG 12 hr tablet Take 30 mg by mouth  every 12 (twelve) hours. 09/20/15   [provider]  promethazine (PHENERGAN) 25 MG tablet Take 25 mg by mouth daily as needed. 04/28/18   [provider]  rizatriptan (MAXALT-MLT) 10 MG disintegrating tablet Take by mouth. 09/25/21   [provider]  theophylline (UNIPHYL) 400 MG 24 hr tablet Take 1 tablet by mouth daily.  12/27/17   Mertie Moores, MD    Family History Family History  Problem Relation Age of Onset   Emphysema Mother    Anxiety disorder Mother    Stroke Father    Throat cancer Father    Lung cancer Maternal Grandmother    Lung cancer Maternal Grandfather    Hypertension Daughter    Diabetes Daughter    Multiple sclerosis Daughter    Bipolar disorder Daughter    Cervical cancer Daughter    Bipolar disorder Daughter    Drug abuse Daughter    Lung cancer Maternal Aunt    Lung cancer Maternal Uncle     Social History Social History   Tobacco Use   Smoking status: Every Day    Current packs/day: 1.00    Average packs/day: 1 pack/day for 45.3 years (45.3 ttl pk-yrs)    Types: Cigarettes    Start date: 05/19/1977   Smokeless tobacco: Never  Vaping Use   Vaping status: Former  Substance Use Topics   Alcohol use: No    Alcohol/week: 0.0 standard drinks of alcohol   Drug use: No     Allergies   Augmentin [amoxicillin-pot clavulanate] and Penicillins   Review of Systems Review of Systems  Constitutional:  Negative for fatigue and fever.  Musculoskeletal:  Positive for arthralgias and joint swelling.  Skin:  Positive for color change and rash.  Neurological:  Negative for weakness.     Physical Exam Triage Vital Signs ED Triage Vitals  Encounter Vitals Group     BP 09/08/22 1512 (!) 150/73     Systolic BP Percentile --      Diastolic BP Percentile --      Pulse Rate 09/08/22 1512 86     Resp 09/08/22 1512 18     Temp 09/08/22 1512 98.2 F (36.8 C)     Temp Source 09/08/22 1512 Oral     SpO2 09/08/22 1512  96 %      Weight 09/08/22 1510 134 lb 14.7 oz (61.2 kg)     Height 09/08/22 1510 5\' 8"  (1.727 m)     Head Circumference --      Peak Flow --      Pain Score 09/08/22 1509 9     Pain Loc --      Pain Education --      Exclude from Growth Chart --    No data found.  Updated Vital Signs BP (!) 150/73 (BP Location: Right Arm)   Pulse 86   Temp 98.2 F (36.8 C) (Oral)   Resp 18   Ht 5\' 8"  (1.727 m)   Wt 134 lb 14.7 oz (61.2 kg)   SpO2 96%   BMI 20.51 kg/m   Physical Exam Vitals and nursing note reviewed.  Constitutional:      General: She is not in acute distress.    Appearance: Normal appearance. She is not ill-appearing or toxic-appearing.  HENT:     Head: Normocephalic and atraumatic.  Eyes:     General: No scleral icterus.       Right eye: No discharge.        Left eye: No discharge.     Conjunctiva/sclera: Conjunctivae normal.  Cardiovascular:     Rate and Rhythm: Normal rate and regular rhythm.     Pulses: Normal pulses.     Heart sounds: Normal heart sounds.  Pulmonary:     Effort: Pulmonary effort is normal. No respiratory distress.     Breath sounds: Normal breath sounds.  Musculoskeletal:     Cervical back: Neck supple.  Skin:    General: Skin is dry.     Comments: Left hand: There is moderate swelling of the dorsal hand and second, third and fourth digits.  Overlying erythema.  Patient has abrasions of arm and contusions.  Reduced range of motion with making a fist.  Tenderness throughout swollen areas.  Good pulses.  Neurological:     General: No focal deficit present.     Mental Status: She is alert. Mental status is at baseline.     Motor: No weakness.     Gait: Gait normal.  Psychiatric:        Mood and Affect: Mood normal.        Behavior: Behavior normal.        Thought Content: Thought content normal.      UC Treatments / Results  Labs (all labs ordered are listed, but only abnormal results are displayed) Labs Reviewed - No data to  display  EKG   Radiology No results found.  Procedures Procedures (including critical care time)  Medications Ordered in UC Medications - No data to display  Initial Impression / Assessment and Plan / UC Course  I have reviewed the triage vital signs and the nursing notes.  Pertinent labs & imaging results that were available during my care of the patient were reviewed by me and considered in my medical decision making (see chart for details).   62 year old female presents for left hand pain, swelling and redness.  Reports getting stung by a hornet a week and a half ago.  Reports swelling had gone down significantly until couple days ago.  Now swelling is gotten worse and has become more warm, red and painful.  History of gout.  No new injury.  See image in chart.  Condition consistent with inflammatory response to insect bite versus cellulitis versus gout.  Seems  more likely cellulitis or gout since the initial swelling and inflammation had gone down.  Will treat at this time with Keflex and Medrol Dosepak.  Also encouraged cryotherapy.  Patient requested a wrist brace for this wrist.  She says it open to a little pressure on her hand and she thinks will be helpful for the swelling.  Reviewed return and ED precautions.   Final Clinical Impressions(s) / UC Diagnoses   Final diagnoses:  Cellulitis of left hand  Insect bite of left hand, initial encounter     Discharge Instructions      -You have an infection of your hand and also inflammation.  I sent medication to the pharmacy.  Also ice your hand and elevate it. - We gave you a brace to use as needed. - You may take Tylenol for pain but your symptoms should be getting better in a couple days. - If you develop a fever or your symptoms or not improving in 2 days or they worsen you should go to the ER.     ED Prescriptions     Medication Sig Dispense Auth. Provider   methylPREDNISolone (MEDROL DOSEPAK) 4 MG TBPK tablet  Take po according to dosepack 21 tablet Eusebio Friendly B, PA-C   cephALEXin (KEFLEX) 500 MG capsule Take 1 capsule (500 mg total) by mouth 4 (four) times daily for 7 days. 28 capsule Shirlee Latch, PA-C      PDMP not reviewed this encounter.   Shirlee Latch, PA-C 09/08/22 1539

## 2022-09-17 NOTE — Progress Notes (Unsigned)
Name: Tricia [name]   MRN: [id number]    DOB: 07-16-1960   Date:09/18/2022       Progress Note  Subjective  Chief Complaint  Annual Exam  HPI  Patient presents for annual CPE.  Diet: cooks at home  Exercise: continue regular activity  Last Eye Exam: she needs to schedule it  Last Dental Exam: dentures   Flowsheet Row Office Visit from 09/18/2022 in [facility]  AUDIT-C Score 0      Depression: Phq 9 is  positive    09/18/2022    1:07 PM 05/09/2022    3:00 PM 02/21/2022   11:11 AM 01/31/2022    9:20 AM 01/16/2022   11:05 AM  Depression screen PHQ 2/9  Decreased Interest 1 1 0 3 3  Down, Depressed, Hopeless 3 1 0 3 3  PHQ - 2 Score 4 2 0 6 6  Altered sleeping 3 3 0 3 3  Tired, decreased energy 3 3 0 3 3  Change in appetite 3 0 0 3 3  Feeling bad or failure about yourself  3 0 0 0 2  Trouble concentrating 3 3 0 3 3  Moving slowly or fidgety/restless 0 0 0 0 2  Suicidal thoughts 0 0 0 0 0  PHQ-9 Score 19 11 0 18 22  Difficult doing work/chores   Not difficult at all Very difficult Very difficult   Hypertension: BP Readings from Last 3 Encounters:  09/18/22 130/76  09/08/22 (!) 150/73  05/09/22 124/70   Obesity: Wt Readings from Last 3 Encounters:  09/18/22 144 lb (65.3 kg)  09/08/22 134 lb 14.7 oz (61.2 kg)  05/09/22 135 lb (61.2 kg)   BMI Readings from Last 3 Encounters:  09/18/22 21.90 kg/m  09/08/22 20.51 kg/m  05/09/22 20.53 kg/m     Vaccines:   RSV: advised to have it at regular pharmacy  Tdap: 2014, due - she will go at the local pharmacy  Shingrix: she will go to local pharmacy Pneumonia: today  Flu: 2022 COVID-19: up to date   Hep C Screening: 08/27/18 STD testing and prevention (HIV/chl/gon/syphilis): 09/09/21 Intimate partner violence: negative screen  Sexual History : not sexually for over 16 years  Menstrual History/LMP/Abnormal Bleeding: post-menopausal  Discussed importance of follow up if any  post-menopausal bleeding: yes  Incontinence Symptoms: negative for symptoms   Breast cancer:  - Last Mammogram: Ordered 05/09/22 - we will schedule it for her  - BRCA gene screening: N/A  Osteoporosis Prevention : Discussed high calcium and vitamin D supplementation, weight bearing exercises Bone density: we will schedule it for her   Cervical cancer screening: collected today   Skin cancer: Discussed monitoring for atypical lesions  Colorectal cancer: 04/25/17   Lung cancer:  up to date with screen, seeing Dr. Meredeth Ide  ECG: 09/08/21  Advanced Care Planning: A voluntary discussion about advance care planning including the explanation and discussion of advance directives.  Discussed health care proxy and Living will, and the patient was able to identify a health care proxy as son in law ( lives with him) .  Patient does not have a living will and power of attorney of health care   Lipids: Lab Results  Component Value Date   CHOL 69 10/25/2021   CHOL 85 09/26/2020   CHOL 148 08/05/2019   Lab Results  Component Value Date   HDL 27 (L) 10/25/2021   HDL 31 (L) 09/26/2020   HDL 39 (L) 08/05/2019  Lab Results  Component Value Date   LDLCALC 27 10/25/2021   LDLCALC 37 09/26/2020   LDLCALC 80 08/05/2019   Lab Results  Component Value Date   TRIG 66 10/25/2021   TRIG 88 09/26/2020   TRIG 193 (H) 08/05/2019   Lab Results  Component Value Date   CHOLHDL 2.6 10/25/2021   CHOLHDL 2.7 09/26/2020   CHOLHDL 3.8 08/05/2019   No results found for: "LDLDIRECT"  Glucose: Glucose  Date Value Ref Range Status  11/16/2013 277 (H) 65 - 99 mg/dL Final  40/98/1191 90 65 - 99 mg/dL Final  47/82/9562 130 (H) 65 - 99 mg/dL Final   Glucose, Bld  Date Value Ref Range Status  10/25/2021 140 (H) 65 - 99 mg/dL Final    Comment:    .            Fasting reference interval . For someone without known diabetes, a glucose value >125 mg/dL indicates that they may have diabetes and this should  be confirmed with a follow-up test. .   09/14/2021 245 (H) 65 - 99 mg/dL Final    Comment:    .            Fasting reference interval . For someone without known diabetes, a glucose value >125 mg/dL indicates that they may have diabetes and this should be confirmed with a follow-up test. .   09/10/2021 249 (H) 70 - 99 mg/dL Final    Comment:    Glucose reference range applies only to samples taken after fasting for at least 8 hours.   Glucose-Capillary  Date Value Ref Range Status  09/10/2021 238 (H) 70 - 99 mg/dL Final    Comment:    Glucose reference range applies only to samples taken after fasting for at least 8 hours.  09/10/2021 217 (H) 70 - 99 mg/dL Final    Comment:    Glucose reference range applies only to samples taken after fasting for at least 8 hours.  09/09/2021 239 (H) 70 - 99 mg/dL Final    Comment:    Glucose reference range applies only to samples taken after fasting for at least 8 hours.    Patient Active Problem List   Diagnosis Date Noted   Sacral wound 02/21/2022   Wound of right leg, initial encounter 02/21/2022   Pressure injury of sacral region, stage 1 02/21/2022   Nausea 09/08/2021   Hypoalbuminemia due to protein-calorie malnutrition (HCC) 09/08/2021   Lumbar herniated disc 07/11/2021   Calculus of gallbladder without cholecystitis without obstruction 07/11/2021   Senile purpura (HCC) 06/05/2021   Atherosclerosis of aorta (HCC) 06/05/2021   Parkinson's disease 06/05/2021   Chronic kidney disease (CKD) stage G3a/A2, moderately decreased glomerular filtration rate (GFR) between 45-59 mL/min/1.73 square meter and albuminuria creatinine ratio between 30-299 mg/g (HCC) 06/05/2021   Esophageal dysphagia    Gastric erythema    Columnar-lined esophagus    Moderate malnutrition (HCC) 09/26/2020   Centrilobular emphysema (HCC) 03/30/2020   History of prolonged Q-T interval on ECG 11/18/2019   GAD (generalized anxiety disorder) 10/13/2019   MDD  (major depressive disorder), recurrent episode, moderate (HCC) 10/13/2019   At risk for long QT syndrome 10/13/2019   Nonrheumatic mitral valve regurgitation 05/04/2019   Benign neoplasm of descending colon    Polyp of sigmoid colon    Polyneuropathy 10/21/2014   Chronic venous insufficiency 10/05/2014   Bilateral leg edema 08/16/2014   Major depression in partial remission (HCC) 08/16/2014   Acid reflux 08/16/2014  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   Asthma, moderate persistent 08/16/2014   Carpal tunnel syndrome 08/16/2014   Cervical pain 08/16/2014   CAFL (chronic airflow limitation) (HCC) 08/16/2014   Type 2 diabetes mellitus with peripheral neuropathy (HCC) 08/16/2014   Diabetes mellitus type 2, insulin dependent (HCC) 08/16/2014   Dyslipidemia 08/16/2014   Tobacco use disorder 08/16/2014   Essential (primary) hypertension 08/16/2014   Benign neoplasm of stomach 08/16/2014   Gout 08/16/2014   Mixed hyperlipidemia 08/16/2014   Low back pain 08/16/2014   Lumbar radiculopathy 08/16/2014   Headache, migraine 08/16/2014   Arthralgia of multiple joints 08/16/2014   Vitamin D deficiency 08/16/2014   Primary osteoarthritis of both knees 06/22/2014   Benign essential tremor 10/02/2013   Cervical dystonia 10/02/2013   Chronic diastolic heart failure (HCC) 11/16/2012   Chronic pain 11/13/2012    Past Surgical History:  Procedure Laterality Date   CARPAL TUNNEL RELEASE Bilateral    x2 right, 1x on left   COLONOSCOPY     COLONOSCOPY WITH PROPOFOL N/A 04/25/2017   Procedure: COLONOSCOPY WITH PROPOFOL;  Surgeon: Midge Minium, MD;  Location: [facility];  Service: Endoscopy;  Laterality: N/A;  diabetic-oral med   DILATION AND CURETTAGE OF UTERUS     ESOPHAGOGASTRODUODENOSCOPY (EGD) WITH PROPOFOL N/A 01/10/2021   Procedure: ESOPHAGOGASTRODUODENOSCOPY (EGD) WITH PROPOFOL;  Surgeon: Pasty Spillers, MD;  Location: [facility];  Service: Endoscopy;   Laterality: N/A;  Diabetic   EXTERNAL EAR SURGERY Left    x2   POLYPECTOMY  04/25/2017   Procedure: POLYPECTOMY INTESTINAL;  Surgeon: Midge Minium, MD;  Location: [facility];  Service: Endoscopy;;   SPINE SURGERY     herniated disc   TUBAL LIGATION      Family History  Problem Relation Age of Onset   Emphysema Mother    Anxiety disorder Mother    Stroke Father    Throat cancer Father    Lung cancer Maternal Grandmother    Lung cancer Maternal Grandfather    Hypertension Daughter    Diabetes Daughter    Multiple sclerosis Daughter    Bipolar disorder Daughter    Cervical cancer Daughter    Bipolar disorder Daughter    Drug abuse Daughter    Lung cancer Maternal Aunt    Lung cancer Maternal Uncle     Social History   Socioeconomic History   Marital status: Widowed    Spouse name: Not on file   Number of children: 2   Years of education: Not on file   Highest education level: Associate degree: academic program  Occupational History   Occupation: Disability  Tobacco Use   Smoking status: Every Day    Current packs/day: 1.00    Average packs/day: 1 pack/day for 45.3 years (45.3 ttl pk-yrs)    Types: Cigarettes    Start date: 05/19/1977   Smokeless tobacco: Never  Vaping Use   Vaping status: Former  Substance and Sexual Activity   Alcohol use: No    Alcohol/week: 0.0 standard drinks of alcohol   Drug use: No   Sexual activity: Not Currently    Birth control/protection: None  Other Topics Concern   Not on file  Social History Narrative   Living with her daughter now    Social Determinants of Health   Financial Resource Strain: Low Risk  (09/18/2022)   Overall Financial Resource Strain (CARDIA)    Difficulty of Paying Living Expenses: Not hard at all  Food Insecurity: No Food Insecurity (  09/18/2022)   Hunger Vital Sign    Worried About Running Out of Food in the Last Year: Never true    Ran Out of Food in the Last Year: Never true  Transportation Needs:  No Transportation Needs (09/18/2022)   PRAPARE - Administrator, Civil Service (Medical): No    Lack of Transportation (Non-Medical): No  Physical Activity: Sufficiently Active (09/18/2022)   Exercise Vital Sign    Days of Exercise per Week: 7 days    Minutes of Exercise per Session: 60 min  Stress: Stress Concern Present (09/18/2022)   [employer] of Occupational Health - Occupational Stress Questionnaire    Feeling of Stress : To some extent  Social Connections: Socially Isolated (09/18/2022)   Social Connection and Isolation Panel [NHANES]    Frequency of Communication with Friends and Family: Three times a week    Frequency of Social Gatherings with Friends and Family: Never    Attends Religious Services: Never    Database administrator or Organizations: No    Attends Banker Meetings: Never    Marital Status: Divorced  Catering manager Violence: Not At Risk (09/18/2022)   Humiliation, Afraid, Rape, and Kick questionnaire    Fear of Current or Ex-Partner: No    Emotionally Abused: No    Physically Abused: No    Sexually Abused: No     Current Outpatient Medications:    cetirizine (ZYRTEC) 10 MG tablet, Take 10 mg by mouth daily as needed for allergies., Disp: , Rfl:    colchicine 0.6 MG tablet, TAKE 1 TABLET BY MOUTH ONCE DAILY, Disp: 30 tablet, Rfl: 0   cyclobenzaprine (FLEXERIL) 10 MG tablet, Take 10 mg by mouth at bedtime., Disp: , Rfl:    DULoxetine (CYMBALTA) 60 MG capsule, TAKE 1 CAPSULE BY MOUTH ONCE DAILY, Disp: 90 capsule, Rfl: 0   empagliflozin (JARDIANCE) 25 MG TABS tablet, Take 1 tablet (25 mg total) by mouth daily before breakfast. In place of Synjardi, Disp: 90 tablet, Rfl: 1   fluticasone (FLONASE) 50 MCG/ACT nasal spray, Place 2 sprays into both nostrils daily., Disp: 48 g, Rfl: 0   furosemide (LASIX) 40 MG tablet, Take 1 tablet (40 mg total) by mouth daily as needed., Disp: 30 tablet, Rfl: 2   gabapentin (NEURONTIN) 600 MG tablet, Take  1,200 mg by mouth 3 (three) times daily., Disp: , Rfl:    ipratropium (ATROVENT) 0.06 % nasal spray, Place 2 sprays into both nostrils 4 (four) times daily., Disp: 45 mL, Rfl: 0   ipratropium-albuterol (DUONEB) 0.5-2.5 (3) MG/3ML SOLN, Inhale 3 mLs into the lungs every 6 (six) hours as needed., Disp: 360 mL, Rfl: 3   levocetirizine (XYZAL) 5 MG tablet, TAKE 1 TABLET BY MOUTH ONCE EVERY EVENING, Disp: 90 tablet, Rfl: 1   lidocaine (LIDODERM) 5 %, 1 patch every 12 (twelve) hours as needed., Disp: , Rfl:    lubiprostone (AMITIZA) 24 MCG capsule, Take by mouth 2 (two) times daily as needed., Disp: , Rfl:    montelukast (SINGULAIR) 10 MG tablet, Take 1 tablet by mouth at bedtime., Disp: , Rfl:    morphine (MSIR) 15 MG tablet, Take 15 mg by mouth., Disp: , Rfl:    omeprazole (PRILOSEC) 40 MG capsule, Take 1 capsule (40 mg total) by mouth daily., Disp: 90 capsule, Rfl: 0   OXYCODONE HCL PO, Take 18 mg by mouth 4 (four) times daily as needed., Disp: , Rfl:    promethazine (PHENERGAN) 25 MG tablet,  Take 25 mg by mouth daily as needed., Disp: , Rfl:    QUEtiapine (SEROQUEL) 25 MG tablet, TAKE 1 TABLET BY MOUTH AT BEDTIME, Disp: 30 tablet, Rfl: 0   rizatriptan (MAXALT-MLT) 10 MG disintegrating tablet, Take by mouth., Disp: , Rfl:    rosuvastatin (CRESTOR) 5 MG tablet, TAKE 1 TABLET BY MOUTH AT BEDTIME, Disp: 90 tablet, Rfl: 0   SYMBICORT 160-4.5 MCG/ACT inhaler, INHALE 2 PUFFS TWICE A DAY RINSE MOUTH WITH WATER AFTER EACH USE, Disp: 10.2 g, Rfl: 2   theophylline (UNIPHYL) 400 MG 24 hr tablet, Take 1 tablet by mouth daily. , Disp: , Rfl:    tiotropium (SPIRIVA) 18 MCG inhalation capsule, Place 1 capsule into inhaler and inhale daily. pm, Disp: , Rfl:    VENTOLIN HFA 108 (90 Base) MCG/ACT inhaler, INHALE 1 PUFF BY MOUTH AS NEEDED, Disp: 18 g, Rfl: 0   carbidopa-levodopa (SINEMET IR) 25-100 MG tablet, Take 1.5 tablets by mouth 3 (three) times daily., Disp: , Rfl:    morphine (MS CONTIN) 30 MG 12 hr tablet, Take  30 mg by mouth every 12 (twelve) hours. (Patient not taking: Reported on 09/18/2022), Disp: , Rfl:   Allergies  Allergen Reactions   Augmentin [Amoxicillin-Pot Clavulanate] Diarrhea   Penicillins Itching     ROS  Constitutional: Negative for fever , positive for weight change.  Respiratory: positive for cough and shortness of breath.   Cardiovascular: Negative for chest pain or palpitations.  Gastrointestinal: Negative for abdominal pain, no bowel changes.  Musculoskeletal: Negative for gait problem or joint swelling.  Skin: Negative for rash.  Neurological: Negative for dizziness or headache.  No other specific complaints in a complete review of systems (except as listed in HPI above).   Objective  Vitals:   09/18/22 1309  BP: 130/76  Pulse: 96  Resp: 16  SpO2: 98%  Weight: 144 lb (65.3 kg)  Height: 5\' 8"  (1.727 m)    Body mass index is 21.9 kg/m.  Physical Exam  Constitutional: Patient appears well-developed No distress.  HENT: Head: Normocephalic and atraumatic. Ears: B TMs ok, no erythema or effusion; Nose: Nose normal. Mouth/Throat: Oropharynx is clear and moist. No oropharyngeal exudate.  Eyes: Conjunctivae and EOM are normal. Pupils are equal, round, and reactive to light. No scleral icterus.  Neck: Normal range of motion. Neck supple. No JVD present. No thyromegaly present.  Cardiovascular: Normal rate, regular rhythm and normal heart sounds.  No murmur heard. Bilateral lower extremity edema but left worse than right and stable  Pulmonary/Chest: Effort normal and breath sounds normal. No respiratory distress. Abdominal: Soft. Bowel sounds are normal, no distension. There is no tenderness. no masses Breast: no lumps or masses, no nipple discharge or rashes FEMALE GENITALIA:  External genitalia normal External urethra normal Vaginal vault normal without discharge or lesions Cervix normal without discharge or lesions Bimanual exam normal without  masses RECTAL:not done  Musculoskeletal: Normal range of motion, no joint effusions.  Small abrasion on left anterior knee from recent fall Neurological: he is alert and oriented to person, place, and time. No cranial nerve deficit. Coordination, balance, strength, speech and gait are normal.  Skin: Skin is warm and dry. senile purpura on arms  Psychiatric: Patient has a normal mood and affect. behavior is normal. Judgment and thought content normal.   Fall Risk:    09/18/2022    1:07 PM 05/09/2022    2:59 PM 02/21/2022   11:11 AM 01/31/2022    9:19 AM 01/16/2022  11:04 AM  Fall Risk   Falls in the past year? 1 1 1 1 1   Number falls in past yr: 1 0 1 1 1   Injury with Fall? 1 0 1 1 0  Risk for fall due to : No Fall Risks No Fall Risks Impaired balance/gait Impaired balance/gait;History of fall(s) History of fall(s)  Follow up Falls prevention discussed Falls prevention discussed Falls prevention discussed;Education provided;Falls evaluation completed Falls prevention discussed;Education provided;Falls evaluation completed Falls prevention discussed;Falls evaluation completed;Education provided     Functional Status Survey: Is the patient deaf or have difficulty hearing?: Yes Does the patient have difficulty seeing, even when wearing glasses/contacts?: No Does the patient have difficulty concentrating, remembering, or making decisions?: Yes Does the patient have difficulty walking or climbing stairs?: No Does the patient have difficulty dressing or bathing?: No Does the patient have difficulty doing errands alone such as visiting a doctor's office or shopping?: No   Assessment & Plan  1. Well adult exam  - DG Bone Density; Future  2. Cervical cancer screening  - Cytology - PAP  3. Breast cancer screening by mammogram  Scheduled mammogram for her   4. Need for pneumococcal 20-valent conjugate vaccination  - Pneumococcal conjugate vaccine 20-valent (Prevnar 20)  5. Ovarian  failure  - DG Bone Density; Future  6. Osteoporosis screening  - DG Bone Density; Future    -USPSTF grade A and B recommendations reviewed with patient; age-appropriate recommendations, preventive care, screening tests, etc discussed and encouraged; healthy living encouraged; see AVS for patient education given to patient -Discussed importance of 150 minutes of physical activity weekly, eat two servings of fish weekly, eat one serving of tree nuts ( cashews, pistachios, pecans, almonds.Marland Kitchen) every other day, eat 6 servings of fruit/vegetables daily and drink plenty of water and avoid sweet beverages.   -Reviewed Health Maintenance: Yes.

## 2022-09-18 ENCOUNTER — Encounter: Payer: Self-pay | Admitting: Family Medicine

## 2022-09-18 ENCOUNTER — Other Ambulatory Visit (HOSPITAL_COMMUNITY)
Admission: RE | Admit: 2022-09-18 | Discharge: 2022-09-18 | Disposition: A | Discharge status: Home / Self Care | Payer: 59 | Source: Ambulatory Visit | Attending: Family Medicine | Admitting: Family Medicine

## 2022-09-18 ENCOUNTER — Ambulatory Visit (INDEPENDENT_AMBULATORY_CARE_PROVIDER_SITE_OTHER): Payer: 59 | Admitting: Family Medicine

## 2022-09-18 ENCOUNTER — Ambulatory Visit: Payer: 59 | Admitting: Family Medicine

## 2022-09-18 VITALS — BP 130/76 | HR 96 | Resp 16 | Ht 68.0 in | Wt 144.0 lb

## 2022-09-18 DIAGNOSIS — Z1151 Encounter for screening for human papillomavirus (HPV): Secondary | ICD-10-CM | POA: Diagnosis not present

## 2022-09-18 DIAGNOSIS — Z0000 Encounter for general adult medical examination without abnormal findings: Secondary | ICD-10-CM

## 2022-09-18 DIAGNOSIS — Z13820 Encounter for screening for osteoporosis: Secondary | ICD-10-CM

## 2022-09-18 DIAGNOSIS — E2839 Other primary ovarian failure: Secondary | ICD-10-CM

## 2022-09-18 DIAGNOSIS — Z1231 Encounter for screening mammogram for malignant neoplasm of breast: Secondary | ICD-10-CM | POA: Diagnosis not present

## 2022-09-18 DIAGNOSIS — Z23 Encounter for immunization: Secondary | ICD-10-CM

## 2022-09-18 DIAGNOSIS — Z01419 Encounter for gynecological examination (general) (routine) without abnormal findings: Secondary | ICD-10-CM | POA: Insufficient documentation

## 2022-09-18 DIAGNOSIS — Z124 Encounter for screening for malignant neoplasm of cervix: Secondary | ICD-10-CM | POA: Diagnosis not present

## 2022-09-18 NOTE — Patient Instructions (Signed)
You  need shingrix, Tdap and RSV

## 2022-09-20 ENCOUNTER — Telehealth: Payer: Self-pay | Admitting: Family Medicine

## 2022-09-20 NOTE — Telephone Encounter (Signed)
Pt is calling to report that the pharmacy has no report that the patient is in need of 2 shots. Pts does not recall what the shots are -Please advise CB- [phone]

## 2022-09-20 NOTE — Telephone Encounter (Signed)
Tried to call pt to inform her but MB is full.

## 2022-09-22 ENCOUNTER — Other Ambulatory Visit: Payer: Self-pay | Admitting: Family Medicine

## 2022-09-22 DIAGNOSIS — J449 Chronic obstructive pulmonary disease, unspecified: Secondary | ICD-10-CM | POA: Diagnosis not present

## 2022-09-22 DIAGNOSIS — F331 Major depressive disorder, recurrent, moderate: Secondary | ICD-10-CM

## 2022-09-24 ENCOUNTER — Other Ambulatory Visit: Payer: Self-pay

## 2022-09-24 ENCOUNTER — Telehealth: Payer: Self-pay | Admitting: Family Medicine

## 2022-09-24 DIAGNOSIS — R918 Other nonspecific abnormal finding of lung field: Secondary | ICD-10-CM | POA: Diagnosis not present

## 2022-09-24 DIAGNOSIS — J449 Chronic obstructive pulmonary disease, unspecified: Secondary | ICD-10-CM | POA: Diagnosis not present

## 2022-09-24 DIAGNOSIS — K219 Gastro-esophageal reflux disease without esophagitis: Secondary | ICD-10-CM | POA: Diagnosis not present

## 2022-09-24 DIAGNOSIS — F17210 Nicotine dependence, cigarettes, uncomplicated: Secondary | ICD-10-CM | POA: Diagnosis not present

## 2022-09-24 DIAGNOSIS — F331 Major depressive disorder, recurrent, moderate: Secondary | ICD-10-CM

## 2022-09-24 NOTE — Telephone Encounter (Signed)
Copied from CRM [id number]. Topic: General - Other >> Sep 24, 2022 12:09 PM Dondra Prader E wrote: Reason for CRM: Pt called to report that she is completely out of her seroquel.

## 2022-09-27 DIAGNOSIS — J449 Chronic obstructive pulmonary disease, unspecified: Secondary | ICD-10-CM | POA: Diagnosis not present

## 2022-10-03 DIAGNOSIS — M5416 Radiculopathy, lumbar region: Secondary | ICD-10-CM | POA: Diagnosis not present

## 2022-10-03 DIAGNOSIS — M542 Cervicalgia: Secondary | ICD-10-CM | POA: Diagnosis not present

## 2022-10-03 DIAGNOSIS — G894 Chronic pain syndrome: Secondary | ICD-10-CM | POA: Diagnosis not present

## 2022-10-03 DIAGNOSIS — K5903 Drug induced constipation: Secondary | ICD-10-CM | POA: Diagnosis not present

## 2022-10-03 DIAGNOSIS — R519 Headache, unspecified: Secondary | ICD-10-CM | POA: Diagnosis not present

## 2022-10-03 DIAGNOSIS — M25569 Pain in unspecified knee: Secondary | ICD-10-CM | POA: Diagnosis not present

## 2022-10-03 DIAGNOSIS — Z79891 Long term (current) use of opiate analgesic: Secondary | ICD-10-CM | POA: Diagnosis not present

## 2022-10-19 NOTE — Progress Notes (Deleted)
Name: Tricia [name]   MRN: [id number]    DOB: October 20, 1960   Date:10/19/2022       Progress Note  Subjective  Chief Complaint  Follow up  HPI  Protein malnutrition: weight loss, without dieting She went to see GI and had EGD 11/22 , but she refused colonoscopy. She has not scheduled her mammogram yet.  She had CT abdomen that showed fatty liver , admitted for CAP with sepsis in July 2023 .  She also needs a pap smear. She has gained weight since last visit , she states appetite is better.   Parkinson's: she was diagnosed in 2021 by Dr. Margaretmary Eddy PA  with right hemibody parkinson's . She is taking Sinemet IR. She states still has balanced problems and tremors of both hands . She was supposed to have PT but never heard from facility, advised her to contact Dr. Sherryll Burger , she missed last visit with him    Diabetes type II ,currently Synjardi   A1C today is down from 6.2 % to 5.4 %   She has dyslipidemia, and microalbuminuria.  She denies polyphagia, polydipsia or polyuria.  She has associated dyslipidemia  and CKI , albuminuria on SGL-2 agonist We will stop metformin today, switch from Synjardi to [place]. She has been off Lantus    Chronic pain: sees pain clinic, currently going to [facility], she states pain is still up and down, worse pain is on lumbar spine. She does not sleep well and has fallen asleep while sitting on the toilet, advised to discuss it with pain clinic, also needs to see psychiatrist to manage her sleep   Chronic nausea/gastritis/barrett's she has nausea, due for follow up with GI, advised to ask them for refill of promethazine, she states not longer working for nausea, but she also states zofran did not work in the past, she needs to contact GI again   Gout: she has flares intermittently, takes colchicine prn Unchanged    Asthma she also has  chronic airflow limitation/centrolobular emphysema :  under the care of Dr. Meredeth Ide. She had CT repeat CT 08/2021 , found  to have ground glass changes and will follow up with him soon, she is taking medication given by pulmonologist - she is going back on March 27 th for a repeat study    08/2021  1. Lung-RADS 0, incomplete. Additional lung cancer screening CT images/or comparison to prior chest CT examinations is needed.   Extensive mid and lower lung zone predominant peribronchovascular ground-glass, nodularity and endobronchial debris, new from prior and most indicative of an infectious bronchiolitis. Recommend repeat low-dose lung cancer screening CT in 4-6 weeks, after appropriate therapy.   These results will be called to the ordering clinician or representative by the Radiologist Assistant, and communication documented in the PACS or [employer]. 2. Marked hepatic steatosis. 3. Aortic atherosclerosis (ICD10-I70.0). Coronary artery calcification. 4.  Emphysema (ICD10-J43.9).  Thyroid nodule: had US done by Dr. Turner Daniels at [facility] 06/2019, she states they decided not to do a biopsy , repeat US in 22 did not recommend repeat biopsy , small lymphonodo on left side, seems reactive, she did not see Endo as planned - she states she saw someone but not sure who it was. I don't see any records   Major Depression: chronic and recurrent, phq 9 is high, she was under the care of Dr. Elna Breslow but lost to follow up . She recently lost her boyfriend of 20 plus years, he left  her a lot of debt and not money to pay for it, she finally sold her house and moved in with her daughter and son in Social [place]. Her daughter died at age 59 in Jan 22, 2023. She is still living at the same place. She is having problems sleeping, falls asleep while sitting down during the day, fell from the toilet seat. Advised to follow up with psychiatrist to manage her day time somnolence. She is willing to see another psychiatrist    CHF: doing well at this time,  she has orthopnea - she uses two pillows . Dr. Lady Gary retired, but needs to follow up with  [facility] cardiology, she is on lasix  and SGL2 agonist since 08/22. Tolerating it well. She is not on ARB or ACE due to low bp. She is on statin therapy and denies side effects   Echo from 06/05/2019  NORMAL LEFT VENTRICULAR SYSTOLIC FUNCTION  NORMAL RIGHT VENTRICULAR SYSTOLIC FUNCTION  MILD VALVULAR REGURGITATION (See above)  NO VALVULAR STENOSIS  Closest EF: >55% (Estimated)  Aortic: MILD AR  Mitral: MILD MR  Tricuspid: MILD TR  Echo showed enlarged left and right atrium   Atherosclerosis of Aorta: discussed CT , she is now taking statin therapy and last LDL has been at goal with medication. Unchanged   Senile purpura: reassurance given, Stable  Patient Active Problem List   Diagnosis Date Noted   Sacral wound 02/21/2022   Pressure injury of sacral region, stage 1 02/21/2022   Nausea 09/08/2021   Hypoalbuminemia due to protein-calorie malnutrition (HCC) 09/08/2021   Lumbar herniated disc 07/11/2021   Calculus of gallbladder without cholecystitis without obstruction 07/11/2021   Senile purpura (HCC) 06/05/2021   Atherosclerosis of aorta (HCC) 06/05/2021   Parkinson's disease 06/05/2021   Chronic kidney disease (CKD) stage G3a/A2, moderately decreased glomerular filtration rate (GFR) between 45-59 mL/min/1.73 square meter and albuminuria creatinine ratio between 30-299 mg/g (HCC) 06/05/2021   Esophageal dysphagia    Gastric erythema    Columnar-lined esophagus    Moderate malnutrition (HCC) 09/26/2020   Centrilobular emphysema (HCC) 03/30/2020   History of prolonged Q-T interval on ECG 11/18/2019   GAD (generalized anxiety disorder) 10/13/2019   MDD (major depressive disorder), recurrent episode, moderate (HCC) 10/13/2019   At risk for long QT syndrome 10/13/2019   Nonrheumatic mitral valve regurgitation 05/04/2019   Benign neoplasm of descending colon    Polyp of sigmoid colon    Polyneuropathy 10/21/2014   Chronic venous insufficiency 10/05/2014   Bilateral leg  edema 08/16/2014   Major depression in partial remission (HCC) 08/16/2014   Acid reflux 08/16/2014   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   Asthma, moderate persistent 08/16/2014   Carpal tunnel syndrome 08/16/2014   Cervical pain 08/16/2014   CAFL (chronic airflow limitation) (HCC) 08/16/2014   Type 2 diabetes mellitus with peripheral neuropathy (HCC) 08/16/2014   Diabetes mellitus type 2, insulin dependent (HCC) 08/16/2014   Dyslipidemia 08/16/2014   Tobacco use disorder 08/16/2014   Essential (primary) hypertension 08/16/2014   Benign neoplasm of stomach 08/16/2014   Gout 08/16/2014   Mixed hyperlipidemia 08/16/2014   Low back pain 08/16/2014   Lumbar radiculopathy 08/16/2014   Headache, migraine 08/16/2014   Arthralgia of multiple joints 08/16/2014   Vitamin D deficiency 08/16/2014   Primary osteoarthritis of both knees 06/22/2014   Benign essential tremor 10/02/2013   Cervical dystonia 10/02/2013   Chronic diastolic heart failure (HCC) 11/16/2012   Chronic pain 11/13/2012    Past Surgical History:  Procedure Laterality  Date   CARPAL TUNNEL RELEASE Bilateral    x2 right, 1x on left   COLONOSCOPY     COLONOSCOPY WITH PROPOFOL N/A 04/25/2017   Procedure: COLONOSCOPY WITH PROPOFOL;  Surgeon: Midge Minium, MD;  Location[facility];  Service: Endoscopy;  Laterality: N/A;  diabetic-oral med   DILATION AND CURETTAGE OF UTERUS     ESOPHAGOGASTRODUODENOSCOPY (EGD) WITH PROPOFOL N/A 01/10/2021   Procedure: ESOPHAGOGASTRODUODENOSCOPY (EGD) WITH PROPOFOL;  Surgeon: Pasty Spillers, MD;  Location: [facility];  Service: Endoscopy;  Laterality: N/A;  Diabetic   EXTERNAL EAR SURGERY Left    x2   POLYPECTOMY  04/25/2017   Procedure: POLYPECTOMY INTESTINAL;  Surgeon: Midge Minium, MD;  Location: [facility];  Service: Endoscopy;;   SPINE SURGERY     herniated disc   TUBAL LIGATION      Family History  Problem Relation Age of Onset   Emphysema Mother     Anxiety disorder Mother    Stroke Father    Throat cancer Father    Lung cancer Maternal Grandmother    Lung cancer Maternal Grandfather    Hypertension Daughter    Diabetes Daughter    Multiple sclerosis Daughter    Bipolar disorder Daughter    Cervical cancer Daughter    Bipolar disorder Daughter    Drug abuse Daughter    Lung cancer Maternal Aunt    Lung cancer Maternal Uncle     Social History   Tobacco Use   Smoking status: Every Day    Current packs/day: 1.00    Average packs/day: 1 pack/day for 45.4 years (45.4 ttl pk-yrs)    Types: Cigarettes    Start date: 05/19/1977   Smokeless tobacco: Never  Substance Use Topics   Alcohol use: No    Alcohol/week: 0.0 standard drinks of alcohol     Current Outpatient Medications:    carbidopa-levodopa (SINEMET IR) 25-100 MG tablet, Take 1.5 tablets by mouth 3 (three) times daily., Disp: , Rfl:    cetirizine (ZYRTEC) 10 MG tablet, Take 10 mg by mouth daily as needed for allergies., Disp: , Rfl:    colchicine 0.6 MG tablet, TAKE 1 TABLET BY MOUTH ONCE DAILY, Disp: 30 tablet, Rfl: 0   cyclobenzaprine (FLEXERIL) 10 MG tablet, Take 10 mg by mouth at bedtime., Disp: , Rfl:    DULoxetine (CYMBALTA) 60 MG capsule, TAKE 1 CAPSULE BY MOUTH ONCE DAILY, Disp: 90 capsule, Rfl: 0   empagliflozin (JARDIANCE) 25 MG TABS tablet, Take 1 tablet (25 mg total) by mouth daily before breakfast. In place of Synjardi, Disp: 90 tablet, Rfl: 1   fluticasone (FLONASE) 50 MCG/ACT nasal spray, Place 2 sprays into both nostrils daily., Disp: 48 g, Rfl: 0   furosemide (LASIX) 40 MG tablet, Take 1 tablet (40 mg total) by mouth daily as needed., Disp: 30 tablet, Rfl: 2   gabapentin (NEURONTIN) 600 MG tablet, Take 1,200 mg by mouth 3 (three) times daily., Disp: , Rfl:    ipratropium (ATROVENT) 0.06 % nasal spray, Place 2 sprays into both nostrils 4 (four) times daily., Disp: 45 mL, Rfl: 0   ipratropium-albuterol (DUONEB) 0.5-2.5 (3) MG/3ML SOLN, Inhale 3 mLs  into the lungs every 6 (six) hours as needed., Disp: 360 mL, Rfl: 3   levocetirizine (XYZAL) 5 MG tablet, TAKE 1 TABLET BY MOUTH ONCE EVERY EVENING, Disp: 90 tablet, Rfl: 1   lidocaine (LIDODERM) 5 %, 1 patch every 12 (twelve) hours as needed., Disp: , Rfl:    lubiprostone (  AMITIZA) 24 MCG capsule, Take by mouth 2 (two) times daily as needed., Disp: , Rfl:    montelukast (SINGULAIR) 10 MG tablet, Take 1 tablet by mouth at bedtime., Disp: , Rfl:    morphine (MSIR) 15 MG tablet, Take 15 mg by mouth., Disp: , Rfl:    omeprazole (PRILOSEC) 40 MG capsule, Take 1 capsule (40 mg total) by mouth daily., Disp: 90 capsule, Rfl: 0   OXYCODONE HCL PO, Take 18 mg by mouth 4 (four) times daily as needed., Disp: , Rfl:    promethazine (PHENERGAN) 25 MG tablet, Take 25 mg by mouth daily as needed., Disp: , Rfl:    QUEtiapine (SEROQUEL) 25 MG tablet, TAKE 1 TABLET BY MOUTH AT BEDTIME, Disp: 30 tablet, Rfl: 0   rizatriptan (MAXALT-MLT) 10 MG disintegrating tablet, Take by mouth., Disp: , Rfl:    rosuvastatin (CRESTOR) 5 MG tablet, TAKE 1 TABLET BY MOUTH AT BEDTIME, Disp: 90 tablet, Rfl: 0   SYMBICORT 160-4.5 MCG/ACT inhaler, INHALE 2 PUFFS TWICE A DAY RINSE MOUTH WITH WATER AFTER EACH USE, Disp: 10.2 g, Rfl: 2   theophylline (UNIPHYL) 400 MG 24 hr tablet, Take 1 tablet by mouth daily. , Disp: , Rfl:    tiotropium (SPIRIVA) 18 MCG inhalation capsule, Place 1 capsule into inhaler and inhale daily. pm, Disp: , Rfl:    VENTOLIN HFA 108 (90 Base) MCG/ACT inhaler, INHALE 1 PUFF BY MOUTH AS NEEDED, Disp: 18 g, Rfl: 0  Allergies  Allergen Reactions   Augmentin [Amoxicillin-Pot Clavulanate] Diarrhea   Penicillins Itching    I personally reviewed {Reviewed:[id number]} with the patient/caregiver today.   ROS  ***  Objective  There were no vitals filed for this visit.  There is no height or weight on file to calculate BMI.  Physical Exam ***  Recent Results (from the past 2160 hour(s))  Cytology - PAP      Status: None   Collection Time: 09/18/22  1:39 PM  Result Value Ref Range   High risk HPV Negative    Adequacy      Satisfactory for evaluation; transformation zone component PRESENT.   Diagnosis      - Negative for Intraepithelial Lesions or Malignancy (NILM)   Diagnosis - Benign reactive/reparative changes    Comment Normal Reference Range HPV - Negative     Diabetic Foot Exam: Diabetic Foot Exam - Simple   No data filed    ***  PHQ2/9:    09/18/2022    1:07 PM 05/09/2022    3:00 PM 02/21/2022   11:11 AM 01/31/2022    9:20 AM 01/16/2022   11:05 AM  Depression screen PHQ 2/9  Decreased Interest 1 1 0 3 3  Down, Depressed, Hopeless 3 1 0 3 3  PHQ - 2 Score 4 2 0 6 6  Altered sleeping 3 3 0 3 3  Tired, decreased energy 3 3 0 3 3  Change in appetite 3 0 0 3 3  Feeling bad or failure about yourself  3 0 0 0 2  Trouble concentrating 3 3 0 3 3  Moving slowly or fidgety/restless 0 0 0 0 2  Suicidal thoughts 0 0 0 0 0  PHQ-9 Score 19 11 0 18 22  Difficult doing work/chores   Not difficult at all Very difficult Very difficult    phq 9 is {gen pos [id number]} ***  Fall Risk:    09/18/2022    1:07 PM 05/09/2022    2:59 PM 02/21/2022   11:11  AM 01/31/2022    9:19 AM 01/16/2022   11:04 AM  Fall Risk   Falls in the past year? 1 1 1 1 1   Number falls in past yr: 1 0 1 1 1   Injury with Fall? 1 0 1 1 0  Risk for fall due to : No Fall Risks No Fall Risks Impaired balance/gait Impaired balance/gait;History of fall(s) History of fall(s)  Follow up Falls prevention discussed Falls prevention discussed Falls prevention discussed;Education provided;Falls evaluation completed Falls prevention discussed;Education provided;Falls evaluation completed Falls prevention discussed;Falls evaluation completed;Education provided   ***   Functional Status Survey:   ***   Assessment & Plan  *** There are no diagnoses linked to this encounter.

## 2022-10-23 ENCOUNTER — Ambulatory Visit: Payer: 59 | Admitting: Family Medicine

## 2022-10-23 DIAGNOSIS — J449 Chronic obstructive pulmonary disease, unspecified: Secondary | ICD-10-CM | POA: Diagnosis not present

## 2022-10-23 DIAGNOSIS — E1142 Type 2 diabetes mellitus with diabetic polyneuropathy: Secondary | ICD-10-CM

## 2022-10-28 DIAGNOSIS — J449 Chronic obstructive pulmonary disease, unspecified: Secondary | ICD-10-CM | POA: Diagnosis not present

## 2022-10-30 ENCOUNTER — Other Ambulatory Visit: Payer: Self-pay | Admitting: Internal Medicine

## 2022-10-30 ENCOUNTER — Other Ambulatory Visit: Payer: Self-pay | Admitting: Family Medicine

## 2022-10-30 DIAGNOSIS — F411 Generalized anxiety disorder: Secondary | ICD-10-CM

## 2022-10-30 DIAGNOSIS — F331 Major depressive disorder, recurrent, moderate: Secondary | ICD-10-CM
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
## 2022-10-31 NOTE — Progress Notes (Unsigned)
Name: Tricia [name]   MRN: [id number]    DOB: 26-Jun-1960   Date:11/01/2022       Progress Note  Subjective  Chief Complaint  Follow up  HPI   Parkinson's symptoms : she was diagnosed in 2021 by Dr. Margaretmary Eddy PA  with right hemibody parkinson's . She is taking Sinemet IR. She states still has balanced problems and tremors of both hands . Stable    Diabetes type II : A1C is normal now, off Metformin taking Jardiance.  She has dyslipidemia, and microalbuminuria.  She denies polyphagia, polydipsia or polyuria.  She has associated dyslipidemia  and CKI , albuminuria on SGL-2 agonist She is off insulin    Chronic pain: sees pain clinic, currently going to [facility], she states pain is still up and down, worse pain is on lumbar spine.  She takes Amitiza for opioid induced constipation   Chronic nausea/gastritis/barrett's she has nausea, due for follow up with GI, she asked again for promethazine since zofran does not work, I will send PPI but only 10 zofran's she will need to see GI for refills. Reminded her she must call them for follow up  Gout: she has flares intermittently, takes colchicine prn and needs a refill    Asthma she also has  chronic airflow limitation/centrolobular emphysema :  under the care of Dr. Meredeth Ide. She had CT repeat CT 08/2022 and needs to go back in 3-6 months for possible progressive / inflammatory process - explained must follow up with Dr. Meredeth Ide    Thyroid nodule: had US done by Dr. Turner Daniels at [facility] 06/2019, she states they decided not to do a biopsy , repeat US in 22 did not recommend repeat biopsy , small lymphonodo on left side, seems reactive, she did not see Endo as planned - she states she saw someone but not sure who it was. I don't see any records   Major Depression: chronic and recurrent, phq 9 is high, she was under the care of Dr. Elna Breslow but lost to follow up . She recently lost her boyfriend of 20 plus years, he left her a lot of  debt and not money to pay for it, she finally sold her house and moved in with her daughter and son in [place]. Her daughter died at age 33 in 01-21-23. She is still living at the same place. She is having problems sleeping, falls asleep while sitting down during the day. I am printing phone number for [facility] since visits can be virtual    CHF: doing well at this time,  she has orthopnea - she uses two pillows . Dr. Lady Gary retired, but needs to follow up with [facility] cardiology, she is on lasix  and SGL2 agonist since 08/22. Tolerating it well. She is not on ARB or ACE due to low bp. She is on statin therapy. She will call to re-schedule a visit   Echo from 06/05/2019  NORMAL LEFT VENTRICULAR SYSTOLIC FUNCTION  NORMAL RIGHT VENTRICULAR SYSTOLIC FUNCTION  MILD VALVULAR REGURGITATION (See above)  NO VALVULAR STENOSIS  Closest EF: >55% (Estimated)  Aortic: MILD AR  Mitral: MILD MR  Tricuspid: MILD TR  Echo showed enlarged left and right atrium   Atherosclerosis of Aorta: discussed CT , she is now taking statin therapy and last LDL has been at goal with medication, we will recheck level today   Senile purpura: reassurance given,  stable  Patient Active Problem List   Diagnosis Date Noted  Sacral wound 02/21/2022   Pressure injury of sacral region, stage 1 02/21/2022   Nausea 09/08/2021   Hypoalbuminemia due to protein-calorie malnutrition (HCC) 09/08/2021   Lumbar herniated disc 07/11/2021   Calculus of gallbladder without cholecystitis without obstruction 07/11/2021   Senile purpura (HCC) 06/05/2021   Atherosclerosis of aorta (HCC) 06/05/2021   Parkinson's disease 06/05/2021   Chronic kidney disease (CKD) stage G3a/A2, moderately decreased glomerular filtration rate (GFR) between 45-59 mL/min/1.73 square meter and albuminuria creatinine ratio between 30-299 mg/g (HCC) 06/05/2021   Esophageal dysphagia    Gastric erythema    Columnar-lined esophagus    Moderate malnutrition (HCC)  09/26/2020   Centrilobular emphysema (HCC) 03/30/2020   History of prolonged Q-T interval on ECG 11/18/2019   GAD (generalized anxiety disorder) 10/13/2019   MDD (major depressive disorder), recurrent episode, moderate (HCC) 10/13/2019   At risk for long QT syndrome 10/13/2019   Nonrheumatic mitral valve regurgitation 05/04/2019   Benign neoplasm of descending colon    Polyp of sigmoid colon    Polyneuropathy 10/21/2014   Chronic venous insufficiency 10/05/2014   Bilateral leg edema 08/16/2014   Major depression in partial remission (HCC) 08/16/2014   Acid reflux 08/16/2014   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   Asthma, moderate persistent 08/16/2014   Carpal tunnel syndrome 08/16/2014   Cervical pain 08/16/2014   CAFL (chronic airflow limitation) (HCC) 08/16/2014   Type 2 diabetes mellitus with peripheral neuropathy (HCC) 08/16/2014   Diabetes mellitus type 2, insulin dependent (HCC) 08/16/2014   Dyslipidemia 08/16/2014   Tobacco use disorder 08/16/2014   Essential (primary) hypertension 08/16/2014   Benign neoplasm of stomach 08/16/2014   Gout 08/16/2014   Mixed hyperlipidemia 08/16/2014   Low back pain 08/16/2014   Lumbar radiculopathy 08/16/2014   Headache, migraine 08/16/2014   Arthralgia of multiple joints 08/16/2014   Vitamin D deficiency 08/16/2014   Primary osteoarthritis of both knees 06/22/2014   Benign essential tremor 10/02/2013   Cervical dystonia 10/02/2013   Chronic diastolic heart failure (HCC) 11/16/2012   Chronic pain 11/13/2012    Past Surgical History:  Procedure Laterality Date   CARPAL TUNNEL RELEASE Bilateral    x2 right, 1x on left   COLONOSCOPY     COLONOSCOPY WITH PROPOFOL N/A 04/25/2017   Procedure: COLONOSCOPY WITH PROPOFOL;  Surgeon: Midge Minium, MD;  Location: [facility];  Service: Endoscopy;  Laterality: N/A;  diabetic-oral med   DILATION AND CURETTAGE OF UTERUS     ESOPHAGOGASTRODUODENOSCOPY (EGD) WITH PROPOFOL N/A  01/10/2021   Procedure: ESOPHAGOGASTRODUODENOSCOPY (EGD) WITH PROPOFOL;  Surgeon: Pasty Spillers, MD;  Location: [facility];  Service: Endoscopy;  Laterality: N/A;  Diabetic   EXTERNAL EAR SURGERY Left    x2   POLYPECTOMY  04/25/2017   Procedure: POLYPECTOMY INTESTINAL;  Surgeon: Midge Minium, MD;  Location: [facility];  Service: Endoscopy;;   SPINE SURGERY     herniated disc   TUBAL LIGATION      Family History  Problem Relation Age of Onset   Emphysema Mother    Anxiety disorder Mother    Stroke Father    Throat cancer Father    Lung cancer Maternal Grandmother    Lung cancer Maternal Grandfather    Hypertension Daughter    Diabetes Daughter    Multiple sclerosis Daughter    Bipolar disorder Daughter    Cervical cancer Daughter    Bipolar disorder Daughter    Drug abuse Daughter    Lung cancer Maternal Aunt  Lung cancer Maternal Uncle     Social History   Tobacco Use   Smoking status: Every Day    Current packs/day: 1.00    Average packs/day: 1 pack/day for 45.5 years (45.5 ttl pk-yrs)    Types: Cigarettes    Start date: 05/19/1977   Smokeless tobacco: Never  Substance Use Topics   Alcohol use: No    Alcohol/week: 0.0 standard drinks of alcohol     Current Outpatient Medications:    cetirizine (ZYRTEC) 10 MG tablet, Take 10 mg by mouth daily as needed for allergies., Disp: , Rfl:    colchicine 0.6 MG tablet, TAKE 1 TABLET BY MOUTH ONCE DAILY, Disp: 30 tablet, Rfl: 0   cyclobenzaprine (FLEXERIL) 10 MG tablet, Take 10 mg by mouth at bedtime., Disp: , Rfl:    DULoxetine (CYMBALTA) 60 MG capsule, TAKE 1 CAPSULE BY MOUTH ONCE DAILY, Disp: 90 capsule, Rfl: 0   empagliflozin (JARDIANCE) 25 MG TABS tablet, Take 1 tablet (25 mg total) by mouth daily before breakfast. In place of Synjardi, Disp: 90 tablet, Rfl: 1   fluticasone (FLONASE) 50 MCG/ACT nasal spray, Place 2 sprays into both nostrils daily., Disp: 48 g, Rfl: 0   furosemide (LASIX) 40 MG  tablet, Take 1 tablet (40 mg total) by mouth daily as needed., Disp: 30 tablet, Rfl: 2   gabapentin (NEURONTIN) 600 MG tablet, Take 1,200 mg by mouth 3 (three) times daily., Disp: , Rfl:    ipratropium (ATROVENT) 0.06 % nasal spray, Place 2 sprays into both nostrils 4 (four) times daily., Disp: 45 mL, Rfl: 0   ipratropium-albuterol (DUONEB) 0.5-2.5 (3) MG/3ML SOLN, Inhale 3 mLs into the lungs every 6 (six) hours as needed., Disp: 360 mL, Rfl: 3   levocetirizine (XYZAL) 5 MG tablet, TAKE 1 TABLET BY MOUTH ONCE EVERY EVENING, Disp: 90 tablet, Rfl: 1   lidocaine (LIDODERM) 5 %, 1 patch every 12 (twelve) hours as needed., Disp: , Rfl:    lubiprostone (AMITIZA) 24 MCG capsule, Take by mouth 2 (two) times daily as needed., Disp: , Rfl:    montelukast (SINGULAIR) 10 MG tablet, Take 1 tablet by mouth at bedtime., Disp: , Rfl:    morphine (MSIR) 15 MG tablet, Take 15 mg by mouth., Disp: , Rfl:    omeprazole (PRILOSEC) 40 MG capsule, Take 1 capsule (40 mg total) by mouth daily., Disp: 90 capsule, Rfl: 0   OXYCODONE HCL PO, Take 18 mg by mouth 4 (four) times daily as needed., Disp: , Rfl:    promethazine (PHENERGAN) 25 MG tablet, Take 25 mg by mouth daily as needed., Disp: , Rfl:    QUEtiapine (SEROQUEL) 25 MG tablet, TAKE 1 TABLET BY MOUTH AT BEDTIME, Disp: 30 tablet, Rfl: 0   rizatriptan (MAXALT-MLT) 10 MG disintegrating tablet, Take by mouth., Disp: , Rfl:    rosuvastatin (CRESTOR) 5 MG tablet, TAKE 1 TABLET BY MOUTH AT BEDTIME, Disp: 90 tablet, Rfl: 0   SYMBICORT 160-4.5 MCG/ACT inhaler, INHALE 2 PUFFS TWICE A DAY RINSE MOUTH WITH WATER AFTER EACH USE, Disp: 10.2 g, Rfl: 2   theophylline (UNIPHYL) 400 MG 24 hr tablet, Take 1 tablet by mouth daily. , Disp: , Rfl:    tiotropium (SPIRIVA) 18 MCG inhalation capsule, Place 1 capsule into inhaler and inhale daily. pm, Disp: , Rfl:    VENTOLIN HFA 108 (90 Base) MCG/ACT inhaler, INHALE 1 PUFF BY MOUTH AS NEEDED, Disp: 18 g, Rfl: 0   carbidopa-levodopa (SINEMET  IR) 25-100 MG tablet, Take 1.5 tablets  by mouth 3 (three) times daily., Disp: , Rfl:   Allergies  Allergen Reactions   Augmentin [Amoxicillin-Pot Clavulanate] Diarrhea   Penicillins Itching    I personally reviewed active problem list, medication list, allergies, family history, social history with the patient/caregiver today.   ROS  Constitutional: Negative for fever, positive for  weight change - gain  Respiratory: Negative for cough and shortness of breath.   Cardiovascular: Negative for chest pain or palpitations.  Gastrointestinal: Negative for abdominal pain, no bowel changes.  Musculoskeletal: Negative for gait problem or joint swelling.  Skin: Negative for rash.  Neurological: Negative for dizziness, positive for  headache intermittent .  No other specific complaints in a complete review of systems (except as listed in HPI above).   Objective  Vitals:   11/01/22 0954  BP: 128/70  Pulse: 90  Resp: 16  SpO2: 97%  Weight: 150 lb (68 kg)  Height: 5\' 9"  (1.753 m)    Body mass index is 22.15 kg/m.  Physical Exam  Constitutional: Patient appears well-developed and well-nourished.  No distress.  HEENT: head atraumatic, normocephalic, pupils equal and reactive to light, neck supple Cardiovascular: Normal rate, regular rhythm and normal heart sounds.  No murmur heard. No BLE edema. Pulmonary/Chest: Effort normal and breath sounds normal. No respiratory distress. Abdominal: Soft.  There is no tenderness. Psychiatric: Patient has a normal mood and affect. behavior is normal. Judgment and thought content normal.   Recent Results (from the past 2160 hour(s))  Cytology - PAP     Status: None   Collection Time: 09/18/22  1:39 PM  Result Value Ref Range   High risk HPV Negative    Adequacy      Satisfactory for evaluation; transformation zone component PRESENT.   Diagnosis      - Negative for Intraepithelial Lesions or Malignancy (NILM)   Diagnosis - Benign  reactive/reparative changes    Comment Normal Reference Range HPV - Negative   POCT HgB A1C     Status: Abnormal   Collection Time: 11/01/22 10:09 AM  Result Value Ref Range   Hemoglobin A1C 5.9 (A) 4.0 - 5.6 %   HbA1c POC (<> result, manual entry)     HbA1c, POC (prediabetic range)     HbA1c, POC (controlled diabetic range)       PHQ2/9:    11/01/2022    9:54 AM 09/18/2022    1:07 PM 05/09/2022    3:00 PM 02/21/2022   11:11 AM 01/31/2022    9:20 AM  Depression screen PHQ 2/9  Decreased Interest 3 1 1  0 3  Down, Depressed, Hopeless 3 3 1  0 3  PHQ - 2 Score 6 4 2  0 6  Altered sleeping 3 3 3  0 3  Tired, decreased energy 3 3 3  0 3  Change in appetite 2 3 0 0 3  Feeling bad or failure about yourself  3 3 0 0 0  Trouble concentrating 3 3 3  0 3  Moving slowly or fidgety/restless 0 0 0 0 0  Suicidal thoughts 0 0 0 0 0  PHQ-9 Score 20 19 11  0 18  Difficult doing work/chores    Not difficult at all Very difficult    phq 9 is positive   Fall Risk:    11/01/2022    9:54 AM 09/18/2022    1:07 PM 05/09/2022    2:59 PM 02/21/2022   11:11 AM 01/31/2022    9:19 AM  Fall Risk   Falls in the  past year? 1 1 1 1 1   Number falls in past yr: 1 1 0 1 1  Injury with Fall? 1 1 0 1 1  Risk for fall due to : No Fall Risks No Fall Risks No Fall Risks Impaired balance/gait Impaired balance/gait;History of fall(s)  Follow up Falls prevention discussed Falls prevention discussed Falls prevention discussed Falls prevention discussed;Education provided;Falls evaluation completed Falls prevention discussed;Education provided;Falls evaluation completed     Functional Status Survey: Is the patient deaf or have difficulty hearing?: Yes Does the patient have difficulty seeing, even when wearing glasses/contacts?: No Does the patient have difficulty concentrating, remembering, or making decisions?: Yes Does the patient have difficulty walking or climbing stairs?: Yes Does the patient have difficulty  dressing or bathing?: No Does the patient have difficulty doing errands alone such as visiting a doctor's office or shopping?: No    Assessment & Plan  1. Type 2 diabetes mellitus with peripheral neuropathy (HCC)  - POCT HgB A1C - COMPLETE METABOLIC PANEL WITH GFR - Urine Microalbumin w/creat. ratio - empagliflozin (JARDIANCE) 25 MG TABS tablet; Take 1 tablet (25 mg total) by mouth daily before breakfast.  Dispense: 90 tablet; Refill: 1 - rosuvastatin (CRESTOR) 5 MG tablet; Take 1 tablet (5 mg total) by mouth at bedtime.  Dispense: 90 tablet; Refill: 1  2. MDD (major depressive disorder), recurrent episode, moderate (HCC)  - DULoxetine (CYMBALTA) 60 MG capsule; Take 1 capsule (60 mg total) by mouth daily.  Dispense: 90 capsule; Refill: 0 - QUEtiapine (SEROQUEL) 25 MG tablet; Take 1 tablet (25 mg total) by mouth at bedtime.  Dispense: 90 tablet; Refill: 1  3. Centrilobular emphysema (HCC)  Still smoking , seeing Dr. Meredeth Ide   4. Atherosclerosis of aorta (HCC)  - Lipid panel  5. Senile purpura (HCC)  - CBC with Differential/Platelet  6. Chronic diastolic heart failure (HCC)  Stable   7. Vitamin D deficiency  - VITAMIN D 25 Hydroxy (Vit-D Deficiency, Fractures)  8. GAD (generalized anxiety disorder)  - DULoxetine (CYMBALTA) 60 MG capsule; Take 1 capsule (60 mg total) by mouth daily.  Dispense: 90 capsule; Refill: 0  9. Perennial allergic rhinitis with seasonal variation  - levocetirizine (XYZAL) 5 MG tablet; Take 1 tablet (5 mg total) by mouth every evening.  Dispense: 90 tablet; Refill: 1  10. Gastroesophageal reflux disease without esophagitis  - omeprazole (PRILOSEC) 40 MG capsule; Take 1 capsule (40 mg total) by mouth daily.  Dispense: 90 capsule; Refill: 1  11. Needs flu shot  - Flu vaccine trivalent PF, 6mos and older(Flulaval,Afluria,Fluarix,Fluzone)  12. Nausea  She has migraines, but also likely due to all the narcotics, I will give her a few pills but  if needed must get it from pain clinic, I will not give promethazine - ondansetron (ZOFRAN) 4 MG tablet; Take 1 tablet (4 mg total) by mouth daily as needed for nausea or vomiting.  Dispense: 10 tablet; Refill: 0

## 2022-11-01 ENCOUNTER — Encounter: Payer: Self-pay | Admitting: Family Medicine

## 2022-11-01 ENCOUNTER — Ambulatory Visit (INDEPENDENT_AMBULATORY_CARE_PROVIDER_SITE_OTHER): Payer: 59 | Admitting: Family Medicine

## 2022-11-01 VITALS — BP 128/70 | HR 90 | Resp 16 | Ht 69.0 in | Wt 150.0 lb

## 2022-11-01 DIAGNOSIS — Z23 Encounter for immunization: Secondary | ICD-10-CM

## 2022-11-01 DIAGNOSIS — K219 Gastro-esophageal reflux disease without esophagitis: Secondary | ICD-10-CM

## 2022-11-01 DIAGNOSIS — R110 Nausea: Secondary | ICD-10-CM

## 2022-11-01 DIAGNOSIS — F331 Major depressive disorder, recurrent, moderate: Secondary | ICD-10-CM

## 2022-11-01 DIAGNOSIS — I700 Atherosclerosis of aorta: Secondary | ICD-10-CM | POA: Diagnosis not present

## 2022-11-01 DIAGNOSIS — E559 Vitamin D deficiency, unspecified: Secondary | ICD-10-CM

## 2022-11-01 DIAGNOSIS — E1142 Type 2 diabetes mellitus with diabetic polyneuropathy: Secondary | ICD-10-CM | POA: Diagnosis not present

## 2022-11-01 DIAGNOSIS — J432 Centrilobular emphysema: Secondary | ICD-10-CM

## 2022-11-01 DIAGNOSIS — I5032 Chronic diastolic (congestive) heart failure: Secondary | ICD-10-CM

## 2022-11-01 DIAGNOSIS — D692 Other nonthrombocytopenic purpura: Secondary | ICD-10-CM | POA: Diagnosis not present

## 2022-11-01 DIAGNOSIS — J3089 Other allergic rhinitis: Secondary | ICD-10-CM

## 2022-11-01 DIAGNOSIS — J302 Other seasonal allergic rhinitis: Secondary | ICD-10-CM

## 2022-11-01 DIAGNOSIS — F411 Generalized anxiety disorder: Secondary | ICD-10-CM

## 2022-11-01 LAB — POCT GLYCOSYLATED HEMOGLOBIN (HGB A1C): Hemoglobin A1C: 5.9 % — AB (ref 4.0–5.6)

## 2022-11-01 MED ORDER — QUETIAPINE FUMARATE 25 MG PO TABS
25.0000 mg | ORAL_TABLET | Freq: Every day | ORAL | 1 refills | Status: DC
Start: 2022-11-01 — End: 2023-03-01

## 2022-11-01 MED ORDER — ONDANSETRON HCL 4 MG PO TABS: 4.0000 mg | ORAL_TABLET | Freq: Every day | ORAL | 0 refills | Status: DC | PRN

## 2022-11-01 MED ORDER — EMPAGLIFLOZIN 25 MG PO TABS
25.0000 mg | ORAL_TABLET | Freq: Every day | ORAL | 1 refills | Status: DC
Start: 2022-11-01 — End: 2023-05-01

## 2022-11-01 MED ORDER — LEVOCETIRIZINE DIHYDROCHLORIDE 5 MG PO TABS
5.0000 mg | ORAL_TABLET | Freq: Every evening | ORAL | 1 refills | Status: DC
Start: 2022-11-01 — End: 2023-05-01

## 2022-11-01 MED ORDER — ROSUVASTATIN CALCIUM 5 MG PO TABS
5.0000 mg | ORAL_TABLET | Freq: Every day | ORAL | 1 refills | Status: DC
Start: 2022-11-01 — End: 2023-01-21

## 2022-11-01 MED ORDER — COLCHICINE 0.6 MG PO TABS: 0.6000 mg | ORAL_TABLET | Freq: Every day | ORAL | 0 refills | Status: DC

## 2022-11-01 MED ORDER — DULOXETINE HCL 60 MG PO CPEP
60.0000 mg | ORAL_CAPSULE | Freq: Every day | ORAL | 0 refills | Status: DC
Start: 2022-11-01 — End: 2023-01-21

## 2022-11-01 MED ORDER — OMEPRAZOLE 40 MG PO CPDR
40.0000 mg | DELAYED_RELEASE_CAPSULE | Freq: Every day | ORAL | 1 refills | Status: DC
Start: 2022-11-01 — End: 2023-04-12

## 2022-11-01 NOTE — Patient Instructions (Signed)
Referral has been sent to [facility] location) it can be a virtual visit    P: [phone] F: [phone]   Release ID # [id number]

## 2022-11-02 LAB — CBC WITH DIFFERENTIAL/PLATELET
Absolute Monocytes: 429 {cells}/uL (ref 200–950)
Basophils Absolute: 20 {cells}/uL (ref 0–200)
Basophils Relative: 0.3 %
Eosinophils Absolute: 319 {cells}/uL (ref 15–500)
Eosinophils Relative: 4.9 %
HCT: 38.6 % (ref 35.0–45.0)
Hemoglobin: 12.7 g/dL (ref 11.7–15.5)
Lymphs Abs: 2366 {cells}/uL (ref 850–3900)
MCH: 31.6 pg (ref 27.0–33.0)
MCHC: 32.9 g/dL (ref 32.0–36.0)
MCV: 96 fL (ref 80.0–100.0)
MPV: 9.4 fL (ref 7.5–12.5)
Monocytes Relative: 6.6 %
Neutro Abs: 3367 {cells}/uL (ref 1500–7800)
Neutrophils Relative %: 51.8 %
Platelets: 264 10*3/uL (ref 140–400)
RBC: 4.02 10*6/uL (ref 3.80–5.10)
RDW: 11.8 % (ref 11.0–15.0)
Total Lymphocyte: 36.4 %
WBC: 6.5 10*3/uL (ref 3.8–10.8)

## 2022-11-02 LAB — COMPLETE METABOLIC PANEL WITH GFR
AG Ratio: 1.2 (calc) (ref 1.0–2.5)
ALT: 10 U/L (ref 6–29)
AST: 19 U/L (ref 10–35)
Albumin: 3.8 g/dL (ref 3.6–5.1)
Alkaline phosphatase (APISO): 172 U/L — ABNORMAL HIGH (ref 37–153)
BUN/Creatinine Ratio: 16 (calc) (ref 6–22)
BUN: 20 mg/dL (ref 7–25)
CO2: 26 mmol/L (ref 20–32)
Calcium: 8.6 mg/dL (ref 8.6–10.4)
Chloride: 105 mmol/L (ref 98–110)
Creat: 1.26 mg/dL — ABNORMAL HIGH (ref 0.50–1.05)
Globulin: 3.1 g/dL (ref 1.9–3.7)
Glucose, Bld: 104 mg/dL — ABNORMAL HIGH (ref 65–99)
Potassium: 4.8 mmol/L (ref 3.5–5.3)
Sodium: 138 mmol/L (ref 135–146)
Total Bilirubin: 0.3 mg/dL (ref 0.2–1.2)
Total Protein: 6.9 g/dL (ref 6.1–8.1)
eGFR: 48 mL/min/{1.73_m2} — ABNORMAL LOW (ref >=60)

## 2022-11-02 LAB — VITAMIN D 25 HYDROXY (VIT D DEFICIENCY, FRACTURES): Vit D, 25-Hydroxy: 10 ng/mL — ABNORMAL LOW (ref 30–100)

## 2022-11-02 LAB — LIPID PANEL
Cholesterol: 76 mg/dL (ref <200)
HDL: 34 mg/dL — ABNORMAL LOW (ref >=50)
LDL Cholesterol (Calc): 27 mg/dL
Non-HDL Cholesterol (Calc): 42 mg/dL (ref <130)
Total CHOL/HDL Ratio: 2.2 (calc) (ref <5.0)
Triglycerides: 72 mg/dL (ref <150)

## 2022-11-02 LAB — MICROALBUMIN / CREATININE URINE RATIO
Creatinine, Urine: 52 mg/dL (ref 20–275)
Microalb Creat Ratio: 212 mg/g{creat} — ABNORMAL HIGH (ref <30)
Microalb, Ur: 11 mg/dL

## 2022-11-22 DIAGNOSIS — J449 Chronic obstructive pulmonary disease, unspecified: Secondary | ICD-10-CM | POA: Diagnosis not present

## 2022-11-27 DIAGNOSIS — J449 Chronic obstructive pulmonary disease, unspecified: Secondary | ICD-10-CM | POA: Diagnosis not present

## 2022-11-28 ENCOUNTER — Other Ambulatory Visit: Payer: 59

## 2022-11-28 DIAGNOSIS — M542 Cervicalgia: Secondary | ICD-10-CM | POA: Diagnosis not present

## 2022-11-28 DIAGNOSIS — R519 Headache, unspecified: Secondary | ICD-10-CM | POA: Diagnosis not present

## 2022-11-28 DIAGNOSIS — G894 Chronic pain syndrome: Secondary | ICD-10-CM | POA: Diagnosis not present

## 2022-11-28 DIAGNOSIS — Z79899 Other long term (current) drug therapy: Secondary | ICD-10-CM | POA: Diagnosis not present

## 2022-11-28 DIAGNOSIS — M5416 Radiculopathy, lumbar region: Secondary | ICD-10-CM | POA: Diagnosis not present

## 2022-11-28 DIAGNOSIS — M25569 Pain in unspecified knee: Secondary | ICD-10-CM | POA: Diagnosis not present

## 2022-11-28 DIAGNOSIS — K5903 Drug induced constipation: Secondary | ICD-10-CM | POA: Diagnosis not present

## 2022-11-28 DIAGNOSIS — Z79891 Long term (current) use of opiate analgesic: Secondary | ICD-10-CM | POA: Diagnosis not present

## 2022-12-23 DIAGNOSIS — J449 Chronic obstructive pulmonary disease, unspecified: Secondary | ICD-10-CM | POA: Diagnosis not present

## 2022-12-28 DIAGNOSIS — J449 Chronic obstructive pulmonary disease, unspecified: Secondary | ICD-10-CM | POA: Diagnosis not present

## 2023-01-07 ENCOUNTER — Other Ambulatory Visit: Payer: Self-pay | Admitting: Family Medicine

## 2023-01-18 ENCOUNTER — Other Ambulatory Visit: Payer: Self-pay | Admitting: Family Medicine

## 2023-01-18 DIAGNOSIS — F411 Generalized anxiety disorder: Secondary | ICD-10-CM

## 2023-01-18 DIAGNOSIS — E1142 Type 2 diabetes mellitus with diabetic polyneuropathy: Secondary | ICD-10-CM

## 2023-01-18 DIAGNOSIS — F331 Major depressive disorder, recurrent, moderate: Secondary | ICD-10-CM

## 2023-01-22 DIAGNOSIS — J449 Chronic obstructive pulmonary disease, unspecified: Secondary | ICD-10-CM | POA: Diagnosis not present

## 2023-01-23 DIAGNOSIS — M25569 Pain in unspecified knee: Secondary | ICD-10-CM | POA: Diagnosis not present

## 2023-01-23 DIAGNOSIS — R519 Headache, unspecified: Secondary | ICD-10-CM | POA: Diagnosis not present

## 2023-01-23 DIAGNOSIS — Z79891 Long term (current) use of opiate analgesic: Secondary | ICD-10-CM | POA: Diagnosis not present

## 2023-01-23 DIAGNOSIS — M5416 Radiculopathy, lumbar region: Secondary | ICD-10-CM | POA: Diagnosis not present

## 2023-01-23 DIAGNOSIS — G894 Chronic pain syndrome: Secondary | ICD-10-CM | POA: Diagnosis not present

## 2023-01-23 DIAGNOSIS — M542 Cervicalgia: Secondary | ICD-10-CM | POA: Diagnosis not present

## 2023-01-23 DIAGNOSIS — K5903 Drug induced constipation: Secondary | ICD-10-CM | POA: Diagnosis not present

## 2023-01-24 ENCOUNTER — Encounter: Payer: Self-pay | Admitting: Family Medicine

## 2023-01-27 DIAGNOSIS — J449 Chronic obstructive pulmonary disease, unspecified: Secondary | ICD-10-CM | POA: Diagnosis not present

## 2023-01-29 ENCOUNTER — Other Ambulatory Visit: Payer: Self-pay

## 2023-01-29 DIAGNOSIS — E1142 Type 2 diabetes mellitus with diabetic polyneuropathy: Secondary | ICD-10-CM

## 2023-01-30 DIAGNOSIS — K219 Gastro-esophageal reflux disease without esophagitis: Secondary | ICD-10-CM | POA: Diagnosis not present

## 2023-01-30 DIAGNOSIS — J449 Chronic obstructive pulmonary disease, unspecified: Secondary | ICD-10-CM | POA: Diagnosis not present

## 2023-01-30 DIAGNOSIS — R911 Solitary pulmonary nodule: Secondary | ICD-10-CM | POA: Diagnosis not present

## 2023-01-30 DIAGNOSIS — F17218 Nicotine dependence, cigarettes, with other nicotine-induced disorders: Secondary | ICD-10-CM | POA: Diagnosis not present

## 2023-02-05 ENCOUNTER — Ambulatory Visit: Payer: Medicare Other

## 2023-02-21 DIAGNOSIS — H35373 Puckering of macula, bilateral: Secondary | ICD-10-CM | POA: Diagnosis not present

## 2023-02-21 DIAGNOSIS — H2513 Age-related nuclear cataract, bilateral: Secondary | ICD-10-CM | POA: Diagnosis not present

## 2023-02-21 DIAGNOSIS — E119 Type 2 diabetes mellitus without complications: Secondary | ICD-10-CM | POA: Diagnosis not present

## 2023-02-21 LAB — HM DIABETES EYE EXAM

## 2023-02-22 DIAGNOSIS — J449 Chronic obstructive pulmonary disease, unspecified: Secondary | ICD-10-CM | POA: Diagnosis not present

## 2023-02-27 DIAGNOSIS — J449 Chronic obstructive pulmonary disease, unspecified: Secondary | ICD-10-CM | POA: Diagnosis not present

## 2023-03-01 ENCOUNTER — Other Ambulatory Visit: Payer: Self-pay | Admitting: Family Medicine

## 2023-03-01 DIAGNOSIS — F331 Major depressive disorder, recurrent, moderate: Secondary | ICD-10-CM

## 2023-03-07 ENCOUNTER — Ambulatory Visit (INDEPENDENT_AMBULATORY_CARE_PROVIDER_SITE_OTHER): Payer: 59

## 2023-03-07 DIAGNOSIS — Z0000 Encounter for general adult medical examination without abnormal findings: Secondary | ICD-10-CM | POA: Diagnosis not present

## 2023-03-07 NOTE — Progress Notes (Signed)
Subjective:   Tricia [name] is a 63 y.o. female who presents for Medicare Annual (Subsequent) preventive examination.  Visit Complete: Virtual I connected with  Tricia [name] on 03/07/23 by a audio enabled telemedicine application and verified that I am speaking with the correct person using two identifiers.  This patient declined Interactive audio and Acupuncturist. Therefore the visit was completed with audio only.   Patient Location: Home  Provider Location: Office/Clinic  I discussed the limitations of evaluation and management by telemedicine. The patient expressed understanding and agreed to proceed.  Vital Signs: Because this visit was a virtual/telehealth visit, some criteria may be missing or patient reported. Any vitals not documented were not able to be obtained and vitals that have been documented are patient reported.  Cardiac Risk Factors include: advanced age (>53men, >39 women);diabetes mellitus;dyslipidemia;hypertension;smoking/ tobacco exposure     Objective:    Today's Vitals   03/07/23 1132  PainSc: 4    There is no height or weight on file to calculate BMI.     03/07/2023   11:37 AM 09/08/2022    3:12 PM 09/09/2021   10:47 AM 09/08/2021    3:44 PM 07/06/2021   11:45 AM 01/31/2021    2:01 PM 01/10/2021    7:22 AM  Advanced Directives  Does Patient Have a Medical Advance Directive? No No  No No No No  Would patient like information on creating a medical advance directive? No - Patient declined  Yes (Inpatient - patient requests chaplain consult to create a medical advance directive)  No - Patient declined No - Patient declined Yes (MAU/Ambulatory/Procedural Areas - Information given)    Current Medications (verified) Outpatient Encounter Medications as of 03/07/2023  Medication Sig   cetirizine (ZYRTEC) 10 MG tablet Take 10 mg by mouth daily as needed for allergies.   colchicine 0.6 MG tablet TAKE 1 TABLET BY MOUTH ONCE DAILY    cyclobenzaprine (FLEXERIL) 10 MG tablet Take 10 mg by mouth at bedtime.   DULoxetine (CYMBALTA) 60 MG capsule TAKE 1 CAPSULE BY MOUTH ONCE DAILY   empagliflozin (JARDIANCE) 25 MG TABS tablet Take 1 tablet (25 mg total) by mouth daily before breakfast.   fluticasone (FLONASE) 50 MCG/ACT nasal spray Place 2 sprays into both nostrils daily.   furosemide (LASIX) 40 MG tablet Take 1 tablet (40 mg total) by mouth daily as needed.   gabapentin (NEURONTIN) 600 MG tablet Take 1,200 mg by mouth 3 (three) times daily.   ipratropium (ATROVENT) 0.06 % nasal spray Place 2 sprays into both nostrils 4 (four) times daily.   ipratropium-albuterol (DUONEB) 0.5-2.5 (3) MG/3ML SOLN Inhale 3 mLs into the lungs every 6 (six) hours as needed.   levocetirizine (XYZAL) 5 MG tablet Take 1 tablet (5 mg total) by mouth every evening.   lidocaine (LIDODERM) 5 % 1 patch every 12 (twelve) hours as needed.   lubiprostone (AMITIZA) 24 MCG capsule Take by mouth 2 (two) times daily as needed.   montelukast (SINGULAIR) 10 MG tablet Take 1 tablet by mouth at bedtime.   morphine (MSIR) 15 MG tablet Take 15 mg by mouth.   omeprazole (PRILOSEC) 40 MG capsule Take 1 capsule (40 mg total) by mouth daily.   ondansetron (ZOFRAN) 4 MG tablet Take 1 tablet (4 mg total) by mouth daily as needed for nausea or vomiting.   OXYCODONE HCL PO Take 18 mg by mouth 4 (four) times daily as needed.   QUEtiapine (SEROQUEL) 25 MG tablet TAKE 1 TABLET  BY MOUTH AT BEDTIME   rizatriptan (MAXALT-MLT) 10 MG disintegrating tablet Take by mouth.   rosuvastatin (CRESTOR) 5 MG tablet TAKE 1 TABLET BY MOUTH AT BEDTIME   SYMBICORT 160-4.5 MCG/ACT inhaler INHALE 2 PUFFS TWICE A DAY RINSE MOUTH WITH WATER AFTER EACH USE   theophylline (UNIPHYL) 400 MG 24 hr tablet Take 1 tablet by mouth daily.    tiotropium (SPIRIVA) 18 MCG inhalation capsule Place 1 capsule into inhaler and inhale daily. pm   VENTOLIN HFA 108 (90 Base) MCG/ACT inhaler INHALE 1 PUFF BY MOUTH AS  NEEDED   carbidopa-levodopa (SINEMET IR) 25-100 MG tablet Take 1.5 tablets by mouth 3 (three) times daily.   No facility-administered encounter medications on file as of 03/07/2023.    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   Hyperlipidemia    Hypertension    Motion sickness    boat   Neuromuscular disorder (HCC)    neuropathy feet and hands( nerve damage)   Wears dentures    upper and lower   Past Surgical History:  Procedure Laterality Date   CARPAL TUNNEL RELEASE Bilateral    x2 right, 1x on left   COLONOSCOPY     COLONOSCOPY WITH PROPOFOL N/A 04/25/2017   Procedure: COLONOSCOPY WITH PROPOFOL;  Surgeon: Midge Minium, MD;  Location: [facility];  Service: Endoscopy;  Laterality: N/A;  diabetic-oral med   DILATION AND CURETTAGE OF UTERUS     ESOPHAGOGASTRODUODENOSCOPY (EGD) WITH PROPOFOL N/A 01/10/2021   Procedure: ESOPHAGOGASTRODUODENOSCOPY (EGD) WITH PROPOFOL;  Surgeon: Pasty Spillers, MD;  Location: [facility];  Service: Endoscopy;  Laterality: N/A;  Diabetic   EXTERNAL EAR SURGERY Left    x2   POLYPECTOMY  04/25/2017   Procedure: POLYPECTOMY INTESTINAL;  Surgeon: Midge Minium, MD;  Location: [facility];  Service: Endoscopy;;   SPINE SURGERY     herniated disc   TUBAL LIGATION     Family History  Problem Relation Age of Onset    Emphysema Mother    Anxiety disorder Mother    Stroke Father    Throat cancer Father    Lung cancer Maternal Grandmother    Lung cancer Maternal Grandfather    Hypertension Daughter    Diabetes Daughter    Multiple sclerosis Daughter    Bipolar disorder Daughter    Cervical cancer Daughter    Bipolar disorder Daughter    Drug abuse Daughter    Lung cancer Maternal Aunt    Lung cancer Maternal Uncle    Social History   Socioeconomic History   Marital status: Widowed    Spouse name: Not on file   Number of children: 2   Years of education: Not on file  Highest education level: Associate degree: academic program  Occupational History   Occupation: Disability  Tobacco Use   Smoking status: Every Day    Current packs/day: 1.00    Average packs/day: 1 pack/day for 45.8 years (45.8 ttl pk-yrs)    Types: Cigarettes    Start date: 05/19/1977   Smokeless tobacco: Never  Vaping Use   Vaping status: Former  Substance and Sexual Activity   Alcohol use: No    Alcohol/week: 0.0 standard drinks of alcohol   Drug use: No   Sexual activity: Not Currently    Birth control/protection: None  Other Topics Concern   Not on file  Social History Narrative   Living with her daughter now    Social Drivers of Health   Financial Resource Strain: Medium Risk (03/07/2023)   Overall Financial Resource Strain (CARDIA)    Difficulty of Paying Living Expenses: Somewhat hard  Food Insecurity: No Food Insecurity (03/07/2023)   Hunger Vital Sign    Worried About Running Out of Food in the Last Year: Never true    Ran Out of Food in the Last Year: Never true  Transportation Needs: No Transportation Needs (03/07/2023)   PRAPARE - Administrator, Civil Service (Medical): No    Lack of Transportation (Non-Medical): No  Physical Activity: Sufficiently Active (03/07/2023)   Exercise Vital Sign    Days of Exercise per Week: 7 days    Minutes of Exercise per Session: 30 min  Stress: No Stress  Concern Present (03/07/2023)   [employer] of Occupational Health - Occupational Stress Questionnaire    Feeling of Stress : Only a little  Social Connections: Socially Isolated (03/07/2023)   Social Connection and Isolation Panel [NHANES]    Frequency of Communication with Friends and Family: More than three times a week    Frequency of Social Gatherings with Friends and Family: Never    Attends Religious Services: Never    Database administrator or Organizations: No    Attends Engineer, structural: Never    Marital Status: Divorced    Tobacco Counseling Ready to quit: Not Answered Counseling given: Not Answered   Clinical Intake:  Pre-visit preparation completed: Yes  Pain : 0-10 Pain Score: 4  Pain Type: Chronic pain Pain Location: Back Pain Orientation: Lower Pain Descriptors / Indicators: Aching, Constant, Cramping Pain Onset: More than a month ago Pain Frequency: Constant Pain Relieving Factors: OXYCODONE, LIDOCAINE PATCHES  Pain Relieving Factors: OXYCODONE, LIDOCAINE PATCHES  BMI - recorded: 22.2 Nutritional Status: BMI of 19-24  Normal Nutritional Risks: None Diabetes: Yes CBG done?: No Did pt. bring in CBG monitor from home?: No  How often do you need to have someone help you when you read instructions, pamphlets, or other written materials from your doctor or pharmacy?: 1 - Never  Interpreter Needed?: No  Information entered by :: Kennedy Bucker, LPN   Activities of Daily Living    03/07/2023   11:38 AM 11/01/2022    9:55 AM  In your present state of health, do you have any difficulty performing the following activities:  Hearing? 0 1  Vision? 0 0  Difficulty concentrating or making decisions? 0 1  Walking or climbing stairs? 1 1  Dressing or bathing? 0 0  Doing errands, shopping? 0 0  Preparing Food and eating ? N   Using the Toilet? N   In the past six months, have you accidently leaked urine? N   Do you have problems with  loss of  bowel control? N   Managing your Medications? N   Managing your Finances? N   Housekeeping or managing your Housekeeping? N     Patient Care Team: Alba Cory, MD as PCP - General (Family Medicine) Mertie Moores, MD as Consulting Physician (Pulmonary Disease) Lonell Face, MD as Consulting Physician (Neurology) Lady Gary Darlin Priestly, MD as Consulting Physician (Cardiology) Gaspar Cola, RPH (Inactive) (Pharmacist) Rose Fillers, [place] as Physician Assistant (Pain Medicine) Pasty Spillers, MD (Inactive) as Consulting Physician (Gastroenterology) Pa, [facility] (Optometry)  Indicate any recent Medical Services you may have received from other than [facility] providers in the past year (date may be approximate).     Assessment:   This is a routine wellness examination for [place].  Hearing/Vision screen Hearing Screening - Comments:: NO AIDS Vision Screening - Comments:: WEARS READERS, DRIVING - [facility]   Goals Addressed             This Visit's Progress    DIET - EAT MORE FRUITS AND VEGETABLES         Depression Screen    03/07/2023   11:35 AM 11/01/2022    9:54 AM 09/18/2022    1:07 PM 05/09/2022    3:00 PM 02/21/2022   11:11 AM 01/31/2022    9:20 AM 01/16/2022   11:05 AM  PHQ 2/9 Scores  PHQ - 2 Score 4 6 4 2  0 6 6  PHQ- 9 Score 6 20 19 11  0 18 22    Fall Risk    03/07/2023   11:38 AM 11/01/2022    9:54 AM 09/18/2022    1:07 PM 05/09/2022    2:59 PM 02/21/2022   11:11 AM  Fall Risk   Falls in the past year? 1 1 1 1 1   Number falls in past yr: 1 1 1  0 1  Injury with Fall? 0 1 1 0 1  Risk for fall due to : History of fall(s);Impaired balance/gait No Fall Risks No Fall Risks No Fall Risks Impaired balance/gait  Follow up Falls prevention discussed;Falls evaluation completed Falls prevention discussed Falls prevention discussed Falls prevention discussed Falls prevention discussed;Education provided;Falls evaluation completed    MEDICARE RISK  AT HOME: Medicare Risk at Home Any stairs in or around the home?: Yes If so, are there any without handrails?: No Home free of loose throw rugs in walkways, pet beds, electrical cords, etc?: Yes Adequate lighting in your home to reduce risk of falls?: Yes Life alert?: No Use of a cane, walker or w/c?: No Grab bars in the bathroom?: No Shower chair or bench in shower?: No Elevated toilet seat or a handicapped toilet?: No  TIMED UP AND GO:  Was the test performed?  No    Cognitive Function:        03/07/2023   11:39 AM 01/31/2022   10:10 AM 01/28/2020    2:30 PM 01/27/2019    2:04 PM 01/23/2018    2:18 PM  6CIT Screen  What Year? 0 points 0 points 0 points 0 points 0 points  What month? 0 points 0 points 0 points 0 points 0 points  What time? 0 points 0 points 0 points 0 points 0 points  Count back from 20 0 points 0 points 0 points 0 points 0 points  Months in reverse 0 points 2 points 0 points 0 points 0 points  Repeat phrase 0 points 0 points 0 points 0 points 0 points  Total Score  0 points 2 points 0 points 0 points 0 points    Immunizations Immunization History  Administered Date(s) Administered   Influenza, Seasonal, Injecte, Preservative Fre 11/01/2022   Influenza,inj,Quad PF,6+ Mos 01/24/2015, 12/19/2016, 10/29/2017, 10/14/2019, 01/31/2021   Influenza-Unspecified 12/09/2015   PFIZER(Purple Top)SARS-COV-2 Vaccination 05/15/2019, 06/09/2019   PNEUMOCOCCAL CONJUGATE-20 09/18/2022   Pneumococcal Polysaccharide-23 02/19/2009   Tdap 12/09/2012    TDAP status: Due, Education has been provided regarding the importance of this vaccine. Advised may receive this vaccine at local pharmacy or Health Dept. Aware to provide a copy of the vaccination record if obtained from local pharmacy or Health Dept. Verbalized acceptance and understanding.  Flu Vaccine status: Up to date  Pneumococcal vaccine status: Up to date  Covid-19 vaccine status: Completed vaccines  Qualifies for  Shingles Vaccine? Yes   Zostavax completed No   Shingrix Completed?: No.    Education has been provided regarding the importance of this vaccine. Patient has been advised to call insurance company to determine out of pocket expense if they have not yet received this vaccine. Advised may also receive vaccine at local pharmacy or Health Dept. Verbalized acceptance and understanding.  Screening Tests Health Maintenance  Topic Date Due   MAMMOGRAM  Never done   Zoster Vaccines- Shingrix (1 of 2) Never done   COVID-19 Vaccine (3 - Pfizer risk series) 07/07/2019   OPHTHALMOLOGY EXAM  02/27/2020   DTaP/Tdap/Td (2 - Td or Tdap) 12/10/2022   FOOT EXAM  12/28/2022   HEMOGLOBIN A1C  05/01/2023   Lung Cancer Screening  08/22/2023   Diabetic kidney evaluation - eGFR measurement  11/01/2023   Diabetic kidney evaluation - Urine ACR  11/01/2023   Medicare Annual Wellness (AWV)  03/06/2024   Colonoscopy  04/26/2027   Cervical Cancer Screening (HPV/Pap Cotest)  09/18/2027   Pneumococcal Vaccine 94-50 Years old  Completed   INFLUENZA VACCINE  Completed   Hepatitis C Screening  Completed   HIV Screening  Completed   HPV VACCINES  Aged Out    Health Maintenance  Health Maintenance Due  Topic Date Due   MAMMOGRAM  Never done   Zoster Vaccines- Shingrix (1 of 2) Never done   COVID-19 Vaccine (3 - Pfizer risk series) 07/07/2019   OPHTHALMOLOGY EXAM  02/27/2020   DTaP/Tdap/Td (2 - Td or Tdap) 12/10/2022   FOOT EXAM  12/28/2022    Colorectal cancer screening: Type of screening: Colonoscopy. Completed 04/25/17. Repeat every 10 years  Mammogram status: Ordered 05/09/22. Pt provided with contact info and advised to call to schedule appt.    Lung Cancer Screening: (Low Dose CT Chest recommended if Age 12-80 years, 20 pack-year currently smoking OR have quit w/in 15years.) does qualify.   Lung Cancer Screening Referral: HAD CT 08/22/22  Additional Screening:  Hepatitis C Screening: does qualify;  Completed 08/27/18  Vision Screening: Recommended annual ophthalmology exams for early detection of glaucoma and other disorders of the eye. Is the patient up to date with their annual eye exam?  Yes  Who is the provider or what is the name of the office in which the patient attends annual eye exams? [facility] If pt is not established with a provider, would they like to be referred to a provider to establish care? No .   Dental Screening: Recommended annual dental exams for proper oral hygiene  Diabetic Foot Exam: Diabetic Foot Exam: Completed 12/27/21  Community Resource Referral / Chronic Care Management: CRR required this visit?  No   CCM required this  visit?  No     Plan:     I have personally reviewed and noted the following in the patient's chart:   Medical and social history Use of alcohol, tobacco or illicit drugs  Current medications and supplements including opioid prescriptions. Patient is currently taking opioid prescriptions. Information provided to patient regarding non-opioid alternatives. Patient advised to discuss non-opioid treatment plan with their provider. Functional ability and status Nutritional status Physical activity Advanced directives List of other physicians Hospitalizations, surgeries, and ER visits in previous 12 months Vitals Screenings to include cognitive, depression, and falls Referrals and appointments  In addition, I have reviewed and discussed with patient certain preventive protocols, quality metrics, and best practice recommendations. A written personalized care plan for preventive services as well as general preventive health recommendations were provided to patient.     Hal Hope, LPN   [id number]   After Visit Summary: (MyChart) Due to this being a telephonic visit, the after visit summary with patients personalized plan was offered to patient via MyChart   Nurse Notes: NONE

## 2023-03-07 NOTE — Patient Instructions (Signed)
Tricia [name] , Thank you for taking time to come for your Medicare Wellness Visit. I appreciate your ongoing commitment to your health goals. Please review the following plan we discussed and let me know if I can assist you in the future.   Referrals/Orders/Follow-Ups/Clinician Recommendations: NONE  This is a list of the screening recommended for you and due dates:  Health Maintenance  Topic Date Due   Mammogram  Never done   Zoster (Shingles) Vaccine (1 of 2) Never done   COVID-19 Vaccine (3 - Pfizer risk series) 07/07/2019   Eye exam for diabetics  02/27/2020   DTaP/Tdap/Td vaccine (2 - Td or Tdap) 12/10/2022   Complete foot exam   12/28/2022   Hemoglobin A1C  05/01/2023   Screening for Lung Cancer  08/22/2023   Yearly kidney function blood test for diabetes  11/01/2023   Yearly kidney health urinalysis for diabetes  11/01/2023   Medicare Annual Wellness Visit  03/06/2024   Colon Cancer Screening  04/26/2027   Pap with HPV screening  09/18/2027   Pneumococcal Vaccination  Completed   Flu Shot  Completed   Hepatitis C Screening  Completed   HIV Screening  Completed   HPV Vaccine  Aged Out    Advanced directives: (ACP Link)Information on Advanced Care Planning can be found at [facility] Advance Health Care Directives Advance Health Care Directives ([email or web address]   Next Medicare Annual Wellness Visit scheduled for next year: Yes   03/19/24 @ 11:30 AM BY VIDEO

## 2023-03-25 DIAGNOSIS — J449 Chronic obstructive pulmonary disease, unspecified: Secondary | ICD-10-CM | POA: Diagnosis not present

## 2023-03-30 DIAGNOSIS — J449 Chronic obstructive pulmonary disease, unspecified: Secondary | ICD-10-CM | POA: Diagnosis not present

## 2023-04-04 DIAGNOSIS — K5903 Drug induced constipation: Secondary | ICD-10-CM | POA: Diagnosis not present

## 2023-04-04 DIAGNOSIS — G894 Chronic pain syndrome: Secondary | ICD-10-CM | POA: Diagnosis not present

## 2023-04-04 DIAGNOSIS — M5416 Radiculopathy, lumbar region: Secondary | ICD-10-CM | POA: Diagnosis not present

## 2023-04-04 DIAGNOSIS — Z79891 Long term (current) use of opiate analgesic: Secondary | ICD-10-CM | POA: Diagnosis not present

## 2023-04-04 DIAGNOSIS — M25569 Pain in unspecified knee: Secondary | ICD-10-CM | POA: Diagnosis not present

## 2023-04-04 DIAGNOSIS — R519 Headache, unspecified: Secondary | ICD-10-CM | POA: Diagnosis not present

## 2023-04-04 DIAGNOSIS — M542 Cervicalgia: Secondary | ICD-10-CM | POA: Diagnosis not present

## 2023-04-12 ENCOUNTER — Other Ambulatory Visit: Payer: Self-pay | Admitting: Family Medicine

## 2023-04-12 DIAGNOSIS — E1142 Type 2 diabetes mellitus with diabetic polyneuropathy: Secondary | ICD-10-CM

## 2023-04-12 DIAGNOSIS — F411 Generalized anxiety disorder: Secondary | ICD-10-CM

## 2023-04-12 DIAGNOSIS — K219 Gastro-esophageal reflux disease without esophagitis: Secondary | ICD-10-CM

## 2023-04-12 DIAGNOSIS — F331 Major depressive disorder, recurrent, moderate: Secondary | ICD-10-CM

## 2023-05-01 ENCOUNTER — Ambulatory Visit: Payer: 59 | Admitting: Family Medicine

## 2023-05-01 ENCOUNTER — Encounter: Payer: Self-pay | Admitting: Family Medicine

## 2023-05-01 VITALS — BP 130/72 | HR 98 | Resp 16 | Ht 69.0 in | Wt 158.0 lb

## 2023-05-01 DIAGNOSIS — J302 Other seasonal allergic rhinitis: Secondary | ICD-10-CM

## 2023-05-01 DIAGNOSIS — F331 Major depressive disorder, recurrent, moderate: Secondary | ICD-10-CM

## 2023-05-01 DIAGNOSIS — R1319 Other dysphagia: Secondary | ICD-10-CM

## 2023-05-01 DIAGNOSIS — D692 Other nonthrombocytopenic purpura: Secondary | ICD-10-CM

## 2023-05-01 DIAGNOSIS — E1169 Type 2 diabetes mellitus with other specified complication: Secondary | ICD-10-CM

## 2023-05-01 DIAGNOSIS — I5032 Chronic diastolic (congestive) heart failure: Secondary | ICD-10-CM | POA: Diagnosis not present

## 2023-05-01 DIAGNOSIS — G20A1 Parkinson's disease without dyskinesia, without mention of fluctuations: Secondary | ICD-10-CM

## 2023-05-01 DIAGNOSIS — E1142 Type 2 diabetes mellitus with diabetic polyneuropathy: Secondary | ICD-10-CM

## 2023-05-01 DIAGNOSIS — J441 Chronic obstructive pulmonary disease with (acute) exacerbation: Secondary | ICD-10-CM

## 2023-05-01 DIAGNOSIS — I700 Atherosclerosis of aorta: Secondary | ICD-10-CM | POA: Diagnosis not present

## 2023-05-01 DIAGNOSIS — K219 Gastro-esophageal reflux disease without esophagitis: Secondary | ICD-10-CM

## 2023-05-01 DIAGNOSIS — B351 Tinea unguium: Secondary | ICD-10-CM

## 2023-05-01 DIAGNOSIS — J3089 Other allergic rhinitis: Secondary | ICD-10-CM

## 2023-05-01 DIAGNOSIS — Z1231 Encounter for screening mammogram for malignant neoplasm of breast: Secondary | ICD-10-CM

## 2023-05-01 DIAGNOSIS — F411 Generalized anxiety disorder: Secondary | ICD-10-CM

## 2023-05-01 LAB — POCT GLYCOSYLATED HEMOGLOBIN (HGB A1C): Hemoglobin A1C: 6 % — AB (ref 4.0–5.6)

## 2023-05-01 MED ORDER — PREDNISONE 10 MG PO TABS: 10.0000 mg | ORAL_TABLET | Freq: Two times a day (BID) | ORAL | 0 refills | Status: DC

## 2023-05-01 MED ORDER — LEVOCETIRIZINE DIHYDROCHLORIDE 5 MG PO TABS: 5.0000 mg | ORAL_TABLET | Freq: Every evening | ORAL | 1 refills | Status: DC

## 2023-05-01 MED ORDER — DULOXETINE HCL 60 MG PO CPEP
60.0000 mg | ORAL_CAPSULE | Freq: Every day | ORAL | 1 refills | Status: AC
Start: 2023-05-01 — End: ?

## 2023-05-01 MED ORDER — QUETIAPINE FUMARATE 25 MG PO TABS
25.0000 mg | ORAL_TABLET | Freq: Every day | ORAL | 1 refills | Status: AC
Start: 2023-05-01 — End: ?

## 2023-05-01 MED ORDER — EMPAGLIFLOZIN 25 MG PO TABS: 25.0000 mg | ORAL_TABLET | Freq: Every day | ORAL | 1 refills | Status: DC

## 2023-05-01 MED ORDER — ROSUVASTATIN CALCIUM 5 MG PO TABS: 5.0000 mg | ORAL_TABLET | Freq: Every day | ORAL | 1 refills | Status: AC

## 2023-05-01 MED ORDER — OMEPRAZOLE 40 MG PO CPDR: 40.0000 mg | DELAYED_RELEASE_CAPSULE | Freq: Every day | ORAL | 0 refills | Status: AC

## 2023-05-01 NOTE — Progress Notes (Addendum)
 Name: Tricia [name]   MRN: [id number]    DOB: [birth year]   Date:05/01/2023       Progress Note  Subjective  Chief Complaint  Chief Complaint  Patient presents with   Medical Management of Chronic Issues   HPI   Parkinson's symptoms : she was diagnosed in 2021 by Dr. Margaretmary Eddy PA  with right hemibody parkinson's . She is taking Sinemet IR. She states still has balanced problems and tremors of both hands . Symptoms are stable   Diabetes type II : A1C is normal now, off Metformin and insulin, only taking Jardiance.  She has dyslipidemia, and microalbuminuria, takes statin and SGL-2 agonist. . She denies polyphagia, polydipsia or polyuria. She has long and thick toenails, also bunion and hammer toe, advised appointment with podiatrist  Chronic pain: sees pain clinic, currently going to [facility], she states pain is still up and down, worse pain is on lumbar spine.  She takes Amitiza for opioid induced constipation . Symptoms are stable    Chronic nausea/gastritis/barrett's she has nausea, due for follow up with GI, she asked again for promethazine since zofran does not work, she needs to schedule a follow up, noticing some dysphagia now    Gout: she has flares intermittently, takes colchicine prn    Asthma she also has  chronic airflow limitation/centrolobular emphysema :  under the care of Dr. Meredeth Ide last visit 63/2024 . She takes singulair, spiriva, symbicort , theophiline. She states over the past noticing increase in cough and sputum formation that has been green in color , increase in SOB, no fever or chills - discussed likely COPD flare and we will try treating with prednisone . She uses oxygen at night .   Thyroid nodule: had US done by Dr. Turner Daniels at [facility] clinic 06/2019, she states they decided not to do a biopsy , repeat US in 22 did not recommend repeat biopsy , small lymphonodo on left side, seems reactive, she did not see Endo as planned - she states she saw someone  but not sure who it was. I don't see any records. Unchanged    Major Depression: chronic and recurrent, phq 9 is high, she was under the care of Dr. Elna Breslow but lost to follow up . She recently lost her boyfriend of 20 plus years, he left her a lot of debt and not money to pay for it, she finally sold her house and moved in with her daughter and son in [place]. Her daughter died at age 15 in 2024/01/26. She is still living at the same place. She is having problems sleeping, falls asleep while sitting down during the day. She did some visits through [facility] but states phone visits are not helpful.    CHF: doing well at this time,  she has orthopnea - she uses two pillows . Dr. Lady Gary retired, but needs to follow up with [facility] cardiology, she is on lasix  and SGL2 agonist since 08/22. Tolerating it well. She is not on ARB or ACE due to low bp. She is on statin therapy.    Echo from 06/05/2019  NORMAL LEFT VENTRICULAR SYSTOLIC FUNCTION  NORMAL RIGHT VENTRICULAR SYSTOLIC FUNCTION  MILD VALVULAR REGURGITATION (See above)  NO VALVULAR STENOSIS  Closest EF: >55% (Estimated)  Aortic: MILD AR  Mitral: MILD MR  Tricuspid: MILD TR  Echo showed enlarged left and right atrium   Atherosclerosis of Aorta: discussed CT , she is now taking statin therapy and last LDL  was at goal   Patient Active Problem List   Diagnosis Date Noted   Sacral wound 02/21/2022   Pressure injury of sacral region, stage 1 02/21/2022   Nausea 09/08/2021   Hypoalbuminemia due to protein-calorie malnutrition (HCC) 09/08/2021   Lumbar herniated disc 07/11/2021   Calculus of gallbladder without cholecystitis without obstruction 07/11/2021   Senile purpura (HCC) 06/05/2021   Atherosclerosis of aorta (HCC) 06/05/2021   Parkinson's disease (HCC) 06/05/2021   Chronic kidney disease (CKD) stage G3a/A2, moderately decreased glomerular filtration rate (GFR) between 45-59 mL/min/1.73 square meter and albuminuria creatinine ratio between  30-299 mg/g (HCC) 06/05/2021   Esophageal dysphagia    Gastric erythema    Columnar-lined esophagus    Moderate malnutrition (HCC) 09/26/2020   Centrilobular emphysema (HCC) 03/30/2020   History of prolonged Q-T interval on ECG 11/18/2019   GAD (generalized anxiety disorder) 10/13/2019   MDD (major depressive disorder), recurrent episode, moderate (HCC) 10/13/2019   At risk for long QT syndrome 10/13/2019   Nonrheumatic mitral valve regurgitation 05/04/2019   Benign neoplasm of descending colon    Polyp of sigmoid colon    Polyneuropathy 10/21/2014   Chronic venous insufficiency 10/05/2014   Bilateral leg edema 08/16/2014   Major depression in partial remission (HCC) 08/16/2014   Acid reflux 08/16/2014   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   Asthma, moderate persistent 08/16/2014   Carpal tunnel syndrome 08/16/2014   Cervical pain 08/16/2014   CAFL (chronic airflow limitation) (HCC) 08/16/2014   Type 2 diabetes mellitus with peripheral neuropathy (HCC) 08/16/2014   Diabetes mellitus type 2, insulin dependent (HCC) 08/16/2014   Dyslipidemia 08/16/2014   Tobacco use disorder 08/16/2014   Essential (primary) hypertension 08/16/2014   Benign neoplasm of stomach 08/16/2014   Gout 08/16/2014   Mixed hyperlipidemia 08/16/2014   Low back pain 08/16/2014   Lumbar radiculopathy 08/16/2014   Headache, migraine 08/16/2014   Arthralgia of multiple joints 08/16/2014   Vitamin D deficiency 08/16/2014   Primary osteoarthritis of both knees 06/22/2014   Benign essential tremor 10/02/2013   Cervical dystonia 10/02/2013   Chronic diastolic heart failure (HCC) 11/16/2012   Chronic pain 11/13/2012    Past Surgical History:  Procedure Laterality Date   CARPAL TUNNEL RELEASE Bilateral    x2 right, 1x on left   COLONOSCOPY     COLONOSCOPY WITH PROPOFOL N/A 04/25/2017   Procedure: COLONOSCOPY WITH PROPOFOL;  Surgeon: Midge Minium, MD;  Location: [facility];  Service: Endoscopy;   Laterality: N/A;  diabetic-oral med   DILATION AND CURETTAGE OF UTERUS     ESOPHAGOGASTRODUODENOSCOPY (EGD) WITH PROPOFOL N/A 01/10/2021   Procedure: ESOPHAGOGASTRODUODENOSCOPY (EGD) WITH PROPOFOL;  Surgeon: Pasty Spillers, MD;  Location: [facility];  Service: Endoscopy;  Laterality: N/A;  Diabetic   EXTERNAL EAR SURGERY Left    x2   POLYPECTOMY  04/25/2017   Procedure: POLYPECTOMY INTESTINAL;  Surgeon: Midge Minium, MD;  Location: [facility];  Service: Endoscopy;;   SPINE SURGERY     herniated disc   TUBAL LIGATION      Family History  Problem Relation Age of Onset   Emphysema Mother    Anxiety disorder Mother    Stroke Father    Throat cancer Father    Lung cancer Maternal Grandmother    Lung cancer Maternal Grandfather    Hypertension Daughter    Diabetes Daughter    Multiple sclerosis Daughter    Bipolar disorder Daughter    Cervical cancer Daughter    Bipolar  disorder Daughter    Drug abuse Daughter    Lung cancer Maternal Aunt    Lung cancer Maternal Uncle     Social History   Tobacco Use   Smoking status: Every Day    Current packs/day: 1.00    Average packs/day: 1 pack/day for 45.9 years (45.9 ttl pk-yrs)    Types: Cigarettes    Start date: 05/19/1977   Smokeless tobacco: Never  Substance Use Topics   Alcohol use: No    Alcohol/week: 0.0 standard drinks of alcohol     Current Outpatient Medications:    busPIRone (BUSPAR) 7.5 MG tablet, Take 7.5 mg by mouth 2 (two) times daily., Disp: , Rfl:    cetirizine (ZYRTEC) 10 MG tablet, Take 10 mg by mouth daily as needed for allergies., Disp: , Rfl:    colchicine 0.6 MG tablet, TAKE 1 TABLET BY MOUTH ONCE DAILY, Disp: 30 tablet, Rfl: 0   cyclobenzaprine (FLEXERIL) 10 MG tablet, Take 10 mg by mouth at bedtime., Disp: , Rfl:    DULoxetine (CYMBALTA) 60 MG capsule, TAKE 1 CAPSULE BY MOUTH ONCE DAILY, Disp: 30 capsule, Rfl: 0   empagliflozin (JARDIANCE) 25 MG TABS tablet, Take 1 tablet (25 mg  total) by mouth daily before breakfast., Disp: 90 tablet, Rfl: 1   fluticasone (FLONASE) 50 MCG/ACT nasal spray, Place 2 sprays into both nostrils daily., Disp: 48 g, Rfl: 0   furosemide (LASIX) 40 MG tablet, Take 1 tablet (40 mg total) by mouth daily as needed., Disp: 30 tablet, Rfl: 2   gabapentin (NEURONTIN) 600 MG tablet, Take 1,200 mg by mouth 3 (three) times daily., Disp: , Rfl:    hydrOXYzine (ATARAX) 25 MG tablet, Take 25 mg by mouth 2 (two) times daily., Disp: , Rfl:    ibuprofen (ADVIL) 600 MG tablet, Take by mouth., Disp: , Rfl:    ipratropium (ATROVENT) 0.06 % nasal spray, Place 2 sprays into both nostrils 4 (four) times daily., Disp: 45 mL, Rfl: 0   ipratropium-albuterol (DUONEB) 0.5-2.5 (3) MG/3ML SOLN, Inhale 3 mLs into the lungs every 6 (six) hours as needed., Disp: 360 mL, Rfl: 3   levocetirizine (XYZAL) 5 MG tablet, Take 1 tablet (5 mg total) by mouth every evening., Disp: 90 tablet, Rfl: 1   lidocaine (LIDODERM) 5 %, 1 patch every 12 (twelve) hours as needed., Disp: , Rfl:    lubiprostone (AMITIZA) 24 MCG capsule, Take by mouth 2 (two) times daily as needed., Disp: , Rfl:    montelukast (SINGULAIR) 10 MG tablet, Take 1 tablet by mouth at bedtime., Disp: , Rfl:    morphine (MSIR) 15 MG tablet, Take 15 mg by mouth., Disp: , Rfl:    omeprazole (PRILOSEC) 40 MG capsule, TAKE 1 CAPSULE BY MOUTH ONCE DAILY, Disp: 30 capsule, Rfl: 0   ondansetron (ZOFRAN) 4 MG tablet, Take 1 tablet (4 mg total) by mouth daily as needed for nausea or vomiting., Disp: 10 tablet, Rfl: 0   OXYCODONE HCL PO, Take 18 mg by mouth 4 (four) times daily as needed., Disp: , Rfl:    QUEtiapine (SEROQUEL) 25 MG tablet, TAKE 1 TABLET BY MOUTH AT BEDTIME, Disp: 90 tablet, Rfl: 0   rizatriptan (MAXALT-MLT) 10 MG disintegrating tablet, Take by mouth., Disp: , Rfl:    rosuvastatin (CRESTOR) 5 MG tablet, TAKE 1 TABLET BY MOUTH AT BEDTIME, Disp: 30 tablet, Rfl: 0   SYMBICORT 160-4.5 MCG/ACT inhaler, INHALE 2 PUFFS TWICE A  DAY RINSE MOUTH WITH WATER AFTER EACH USE, Disp: 10.2  g, Rfl: 2   theophylline (UNIPHYL) 400 MG 24 hr tablet, Take 1 tablet by mouth daily. , Disp: , Rfl:    tiotropium (SPIRIVA) 18 MCG inhalation capsule, Place 1 capsule into inhaler and inhale daily. pm, Disp: , Rfl:    VENTOLIN HFA 108 (90 Base) MCG/ACT inhaler, INHALE 1 PUFF BY MOUTH AS NEEDED, Disp: 18 g, Rfl: 0   XTAMPZA ER 13.5 MG C12A, Take by mouth., Disp: , Rfl:    carbidopa-levodopa (SINEMET IR) 25-100 MG tablet, Take 1.5 tablets by mouth 3 (three) times daily., Disp: , Rfl:    ondansetron (ZOFRAN-ODT) 8 MG disintegrating tablet, Take by mouth. (Patient not taking: Reported on 05/01/2023), Disp: , Rfl:   Allergies  Allergen Reactions   Augmentin [Amoxicillin-Pot Clavulanate] Diarrhea   Penicillins Itching    I personally reviewed active problem list, medication list, allergies, family history with the patient/caregiver today.   ROS  Constitutional: Negative for fever or weight change.  Respiratory: Negative for cough and shortness of breath.   Cardiovascular: Negative for chest pain or palpitations.  Gastrointestinal: Negative for abdominal pain, no bowel changes.  Musculoskeletal: Negative for gait problem or joint swelling.  Skin: Negative for rash.  Neurological: Negative for dizziness or headache.  No other specific complaints in a complete review of systems (except as listed in HPI above).   Objective  Vitals:   05/01/23 1446  BP: 130/72  Pulse: 98  Resp: 16  SpO2: 90%  Weight: 158 lb (71.7 kg)  Height: 5\' 9"  (1.753 m)    Body mass index is 23.33 kg/m.  Physical Exam  Constitutional: Patient appears well-developed and well-nourished.  No distress.  HEENT: head atraumatic, normocephalic, pupils equal and reactive to light, neck supple Cardiovascular: Normal rate, regular rhythm and normal heart sounds.  No murmur heard. No BLE edema. Pulmonary/Chest: Effort normal , rhonchi both lungs No respiratory  distress. Abdominal: Soft.  There is no tenderness. Psychiatric: Patient has a normal mood and affect. behavior is normal. Judgment and thought content normal.   Recent Results (from the past 2160 hours)  HM DIABETES EYE EXAM     Status: None   Collection Time: 02/21/23  3:11 PM  Result Value Ref Range   HM Diabetic Eye Exam No Retinopathy No Retinopathy    Comment: ABSTRACTED BY HIM  POCT glycosylated hemoglobin (Hb A1C)     Status: Abnormal   Collection Time: 05/01/23  2:51 PM  Result Value Ref Range   Hemoglobin A1C 6.0 (A) 4.0 - 5.6 %   HbA1c POC (<> result, manual entry)     HbA1c, POC (prediabetic range)     HbA1c, POC (controlled diabetic range)         Diabetic Foot Exam - Simple   Simple Foot Form Visual Inspection See comments: Yes Sensation Testing Intact to touch and monofilament testing bilaterally: Yes Pulse Check Posterior Tibialis and Dorsalis pulse intact bilaterally: Yes Comments Thick toenails, hammer toe, bunion, dry skin       PHQ2/9:    05/01/2023    2:45 PM 03/07/2023   11:35 AM 9/63/2024    9:54 AM 09/18/2022    1:07 PM 05/09/2022    3:00 PM  Depression screen PHQ 2/9  Decreased Interest 3 2 3 1 1   Down, Depressed, Hopeless 3 2 3 3 1   PHQ - 2 Score 6 4 6 4 2   Altered sleeping 3 0 3 3 3   Tired, decreased energy 3 2 3 3 3   Change in  appetite 0 0 2 3 0  Feeling bad or failure about yourself  1 0 3 3 0  Trouble concentrating 0 0 3 3 3   Moving slowly or fidgety/restless 0 0 0 0 0  Suicidal thoughts 0 0 0 0 0  PHQ-9 Score 13 6 20 19 11   Difficult doing work/chores Very difficult Not difficult at all       phq 9 is positive  Fall Risk:    03/07/2023   11:38 AM 9/63/2024    9:54 AM 09/18/2022    1:07 PM 05/09/2022    2:59 PM 02/21/2022   11:11 AM  Fall Risk   Falls in the past year? 1 1 1 1 1   Number falls in past yr: 1 1 1  0 1  Injury with Fall? 0 1 1 0 1  Risk for fall due to : History of fall(s);Impaired balance/gait No Fall Risks No Fall  Risks No Fall Risks Impaired balance/gait  Follow up Falls prevention discussed;Falls evaluation completed Falls prevention discussed Falls prevention discussed Falls prevention discussed Falls prevention discussed;Education provided;Falls evaluation completed     Assessment & Plan  1. Type 2 diabetes mellitus with peripheral neuropathy (HCC) (Primary)  - POCT glycosylated hemoglobin (Hb A1C) - HM Diabetes Foot Exam - empagliflozin (JARDIANCE) 25 MG TABS tablet; Take 1 tablet (25 mg total) by mouth daily before breakfast.  Dispense: 90 tablet; Refill: 1 - rosuvastatin (CRESTOR) 5 MG tablet; Take 1 tablet (5 mg total) by mouth at bedtime.  Dispense: 90 tablet; Refill: 1  2. Parkinson's disease without dyskinesia or fluctuating manifestations (HCC)  Keep follow up with Dr. Sherryll Burger  3. Chronic diastolic heart failure (HCC)  Needs to follow up with cardiologist   4. Atherosclerosis of aorta (HCC)  On statin therapy   5. Onychomycosis of multiple toenails with type 2 diabetes mellitus (HCC)  - Ambulatory referral to Podiatry  6. COPD with acute exacerbation (HCC)  - predniSONE (DELTASONE) 10 MG tablet; Take 1 tablet (10 mg total) by mouth 2 (two) times daily with a meal.  Dispense: 10 tablet; Refill: 0  7. MDD (major depressive disorder), recurrent episode, moderate (HCC)  - DULoxetine (CYMBALTA) 60 MG capsule; Take 1 capsule (60 mg total) by mouth daily.  Dispense: 90 capsule; Refill: 1 - QUEtiapine (SEROQUEL) 25 MG tablet; Take 1 tablet (25 mg total) by mouth at bedtime.  Dispense: 90 tablet; Refill: 1  8. Senile purpura (HCC)  Reassurance given   9. Gastroesophageal reflux disease without esophagitis  - Ambulatory referral to Gastroenterology - omeprazole (PRILOSEC) 40 MG capsule; Take 1 capsule (40 mg total) by mouth daily.  Dispense: 90 capsule; Refill: 0  10. Encounter for screening mammogram for malignant neoplasm of breast  - MM 3D SCREENING MAMMOGRAM BILATERAL  BREAST; Future  11. Esophageal dysphagia  - Ambulatory referral to Gastroenterology  12. GAD (generalized anxiety disorder)  - DULoxetine (CYMBALTA) 60 MG capsule; Take 1 capsule (60 mg total) by mouth daily.  Dispense: 90 capsule; Refill: 1  13. Perennial allergic rhinitis with seasonal variation  - levocetirizine (XYZAL) 5 MG tablet; Take 1 tablet (5 mg total) by mouth every evening.  Dispense: 90 tablet; Refill: 1

## 2023-05-10 ENCOUNTER — Other Ambulatory Visit: Payer: Self-pay | Admitting: Family Medicine

## 2023-05-18 ENCOUNTER — Other Ambulatory Visit: Payer: Self-pay | Admitting: Family Medicine

## 2023-06-08 ENCOUNTER — Other Ambulatory Visit: Payer: Self-pay | Admitting: Family Medicine

## 2023-07-12 ENCOUNTER — Other Ambulatory Visit: Payer: Self-pay | Admitting: Family Medicine

## 2023-08-13 ENCOUNTER — Other Ambulatory Visit: Payer: Self-pay | Admitting: Specialist

## 2023-08-13 DIAGNOSIS — R918 Other nonspecific abnormal finding of lung field: Secondary | ICD-10-CM

## 2023-08-15 ENCOUNTER — Other Ambulatory Visit: Payer: Self-pay | Admitting: Family Medicine

## 2023-08-28 ENCOUNTER — Emergency Department

## 2023-08-28 ENCOUNTER — Other Ambulatory Visit: Payer: Self-pay

## 2023-08-28 ENCOUNTER — Inpatient Hospital Stay
Admission: EM | Admit: 2023-08-28 | Discharge: 2023-09-18 | DRG: 683 | Discharge status: Against Medical Advice | Attending: Hospitalist | Admitting: Hospitalist

## 2023-08-28 DIAGNOSIS — N170 Acute kidney failure with tubular necrosis: Principal | ICD-10-CM | POA: Diagnosis present

## 2023-08-28 DIAGNOSIS — E440 Moderate protein-calorie malnutrition: Secondary | ICD-10-CM | POA: Diagnosis present

## 2023-08-28 DIAGNOSIS — Z880 Allergy status to penicillin: Secondary | ICD-10-CM

## 2023-08-28 DIAGNOSIS — Z5321 Procedure and treatment not carried out due to patient leaving prior to being seen by health care provider: Secondary | ICD-10-CM | POA: Diagnosis present

## 2023-08-28 DIAGNOSIS — I2510 Atherosclerotic heart disease of native coronary artery without angina pectoris: Secondary | ICD-10-CM | POA: Diagnosis present

## 2023-08-28 DIAGNOSIS — E860 Dehydration: Secondary | ICD-10-CM | POA: Diagnosis present

## 2023-08-28 DIAGNOSIS — I130 Hypertensive heart and chronic kidney disease with heart failure and stage 1 through stage 4 chronic kidney disease, or unspecified chronic kidney disease: Secondary | ICD-10-CM | POA: Diagnosis present

## 2023-08-28 DIAGNOSIS — Z825 Family history of asthma and other chronic lower respiratory diseases: Secondary | ICD-10-CM

## 2023-08-28 DIAGNOSIS — E876 Hypokalemia: Secondary | ICD-10-CM | POA: Diagnosis not present

## 2023-08-28 DIAGNOSIS — Z6824 Body mass index (BMI) 24.0-24.9, adult: Secondary | ICD-10-CM

## 2023-08-28 DIAGNOSIS — E785 Hyperlipidemia, unspecified: Secondary | ICD-10-CM | POA: Diagnosis present

## 2023-08-28 DIAGNOSIS — M6282 Rhabdomyolysis: Secondary | ICD-10-CM | POA: Diagnosis not present

## 2023-08-28 DIAGNOSIS — H9192 Unspecified hearing loss, left ear: Secondary | ICD-10-CM | POA: Diagnosis present

## 2023-08-28 DIAGNOSIS — N189 Chronic kidney disease, unspecified: Secondary | ICD-10-CM | POA: Diagnosis present

## 2023-08-28 DIAGNOSIS — Z820 Family history of epilepsy and other diseases of the nervous system: Secondary | ICD-10-CM

## 2023-08-28 DIAGNOSIS — E1122 Type 2 diabetes mellitus with diabetic chronic kidney disease: Secondary | ICD-10-CM | POA: Diagnosis present

## 2023-08-28 DIAGNOSIS — Z7951 Long term (current) use of inhaled steroids: Secondary | ICD-10-CM

## 2023-08-28 DIAGNOSIS — Z79899 Other long term (current) drug therapy: Secondary | ICD-10-CM

## 2023-08-28 DIAGNOSIS — F1120 Opioid dependence, uncomplicated: Secondary | ICD-10-CM | POA: Diagnosis present

## 2023-08-28 DIAGNOSIS — N179 Acute kidney failure, unspecified: Secondary | ICD-10-CM | POA: Diagnosis present

## 2023-08-28 DIAGNOSIS — F419 Anxiety disorder, unspecified: Secondary | ICD-10-CM | POA: Diagnosis present

## 2023-08-28 DIAGNOSIS — B964 Proteus (mirabilis) (morganii) as the cause of diseases classified elsewhere: Secondary | ICD-10-CM | POA: Diagnosis not present

## 2023-08-28 DIAGNOSIS — R531 Weakness: Secondary | ICD-10-CM | POA: Diagnosis not present

## 2023-08-28 DIAGNOSIS — N390 Urinary tract infection, site not specified: Secondary | ICD-10-CM | POA: Diagnosis not present

## 2023-08-28 DIAGNOSIS — Z823 Family history of stroke: Secondary | ICD-10-CM

## 2023-08-28 DIAGNOSIS — G8929 Other chronic pain: Secondary | ICD-10-CM | POA: Diagnosis present

## 2023-08-28 DIAGNOSIS — K219 Gastro-esophageal reflux disease without esophagitis: Secondary | ICD-10-CM | POA: Diagnosis present

## 2023-08-28 DIAGNOSIS — Z818 Family history of other mental and behavioral disorders: Secondary | ICD-10-CM

## 2023-08-28 DIAGNOSIS — I5032 Chronic diastolic (congestive) heart failure: Secondary | ICD-10-CM | POA: Diagnosis present

## 2023-08-28 DIAGNOSIS — L0293 Carbuncle, unspecified: Secondary | ICD-10-CM | POA: Diagnosis present

## 2023-08-28 DIAGNOSIS — E119 Type 2 diabetes mellitus without complications: Secondary | ICD-10-CM | POA: Insufficient documentation

## 2023-08-28 DIAGNOSIS — I951 Orthostatic hypotension: Secondary | ICD-10-CM | POA: Diagnosis present

## 2023-08-28 DIAGNOSIS — F17210 Nicotine dependence, cigarettes, uncomplicated: Secondary | ICD-10-CM | POA: Diagnosis present

## 2023-08-28 DIAGNOSIS — R296 Repeated falls: Secondary | ICD-10-CM | POA: Diagnosis present

## 2023-08-28 DIAGNOSIS — N1831 Chronic kidney disease, stage 3a: Secondary | ICD-10-CM | POA: Diagnosis present

## 2023-08-28 DIAGNOSIS — J449 Chronic obstructive pulmonary disease, unspecified: Secondary | ICD-10-CM | POA: Insufficient documentation

## 2023-08-28 DIAGNOSIS — W06XXXA Fall from bed, initial encounter: Secondary | ICD-10-CM | POA: Diagnosis not present

## 2023-08-28 DIAGNOSIS — Y92230 Patient room in hospital as the place of occurrence of the external cause: Secondary | ICD-10-CM | POA: Diagnosis not present

## 2023-08-28 DIAGNOSIS — B961 Klebsiella pneumoniae [K. pneumoniae] as the cause of diseases classified elsewhere: Secondary | ICD-10-CM | POA: Diagnosis not present

## 2023-08-28 DIAGNOSIS — E1140 Type 2 diabetes mellitus with diabetic neuropathy, unspecified: Secondary | ICD-10-CM | POA: Diagnosis present

## 2023-08-28 DIAGNOSIS — R748 Abnormal levels of other serum enzymes: Secondary | ICD-10-CM | POA: Diagnosis present

## 2023-08-28 DIAGNOSIS — J4489 Other specified chronic obstructive pulmonary disease: Secondary | ICD-10-CM | POA: Diagnosis present

## 2023-08-28 DIAGNOSIS — Z7984 Long term (current) use of oral hypoglycemic drugs: Secondary | ICD-10-CM

## 2023-08-28 DIAGNOSIS — Z8249 Family history of ischemic heart disease and other diseases of the circulatory system: Secondary | ICD-10-CM

## 2023-08-28 DIAGNOSIS — G20A1 Parkinson's disease without dyskinesia, without mention of fluctuations: Secondary | ICD-10-CM | POA: Insufficient documentation

## 2023-08-28 DIAGNOSIS — E43 Unspecified severe protein-calorie malnutrition: Secondary | ICD-10-CM | POA: Insufficient documentation

## 2023-08-28 DIAGNOSIS — R55 Syncope and collapse: Secondary | ICD-10-CM

## 2023-08-28 DIAGNOSIS — F32A Depression, unspecified: Secondary | ICD-10-CM | POA: Diagnosis present

## 2023-08-28 DIAGNOSIS — E8809 Other disorders of plasma-protein metabolism, not elsewhere classified: Secondary | ICD-10-CM | POA: Diagnosis present

## 2023-08-28 DIAGNOSIS — Z833 Family history of diabetes mellitus: Secondary | ICD-10-CM

## 2023-08-28 DIAGNOSIS — W19XXXA Unspecified fall, initial encounter: Principal | ICD-10-CM

## 2023-08-28 LAB — CBC
HCT: 30.5 % — ABNORMAL LOW (ref 36.0–46.0)
HCT: 27.6 % — ABNORMAL LOW (ref 36.0–46.0)
Hemoglobin: 10.8 g/dL — ABNORMAL LOW (ref 12.0–15.0)
Hemoglobin: 9.3 g/dL — ABNORMAL LOW (ref 12.0–15.0)
MCH: 31.7 pg (ref 26.0–34.0)
MCH: 30.9 pg (ref 26.0–34.0)
MCHC: 35.4 g/dL (ref 30.0–36.0)
MCHC: 33.7 g/dL (ref 30.0–36.0)
MCV: 89.4 fL (ref 80.0–100.0)
MCV: 91.7 fL (ref 80.0–100.0)
Platelets: 231 K/uL (ref 150–400)
Platelets: 190 K/uL (ref 150–400)
RBC: 3.41 MIL/uL — ABNORMAL LOW (ref 3.87–5.11)
RBC: 3.01 MIL/uL — ABNORMAL LOW (ref 3.87–5.11)
RDW: 13.8 % (ref 11.5–15.5)
RDW: 14.2 % (ref 11.5–15.5)
WBC: 9.8 K/uL (ref 4.0–10.5)
WBC: 7.7 K/uL (ref 4.0–10.5)
nRBC: 0 % (ref 0.0–0.2)
nRBC: 0 % (ref 0.0–0.2)

## 2023-08-28 LAB — CK: Total CK: 659 U/L — ABNORMAL HIGH (ref 38–234)

## 2023-08-28 LAB — BASIC METABOLIC PANEL WITH GFR
Anion gap: 12 (ref 5–15)
BUN: 31 mg/dL — ABNORMAL HIGH (ref 8–23)
CO2: 24 mmol/L (ref 22–32)
Calcium: 7.4 mg/dL — ABNORMAL LOW (ref 8.9–10.3)
Chloride: 96 mmol/L — ABNORMAL LOW (ref 98–111)
Creatinine, Ser: 2.55 mg/dL — ABNORMAL HIGH (ref 0.44–1.00)
GFR, Estimated: 21 mL/min — ABNORMAL LOW (ref >60)
Glucose, Bld: 92 mg/dL (ref 70–99)
Potassium: 3 mmol/L — ABNORMAL LOW (ref 3.5–5.1)
Sodium: 132 mmol/L — ABNORMAL LOW (ref 135–145)

## 2023-08-28 LAB — CREATININE, SERUM
Creatinine, Ser: 2.2 mg/dL — ABNORMAL HIGH (ref 0.44–1.00)
GFR, Estimated: 25 mL/min — ABNORMAL LOW (ref >60)

## 2023-08-28 LAB — TROPONIN I (HIGH SENSITIVITY): Troponin I (High Sensitivity): 15 ng/L (ref <18)

## 2023-08-28 MED ORDER — EMPAGLIFLOZIN 10 MG PO TABS
10.0000 mg | ORAL_TABLET | Freq: Every day | ORAL | Status: DC
  Administered 2023-08-29: 10 mg via ORAL
  Filled 2023-08-28: qty 1

## 2023-08-28 MED ORDER — MAGNESIUM HYDROXIDE 400 MG/5ML PO SUSP: 30.0000 mL | Freq: Every day | ORAL | Status: DC | PRN

## 2023-08-28 MED ORDER — DULOXETINE HCL 30 MG PO CPEP
60.0000 mg | ORAL_CAPSULE | Freq: Every day | ORAL | Status: DC
  Administered 2023-08-29 – 2023-09-18 (×21): 60 mg via ORAL
  Filled 2023-08-28 (×10): qty 2
  Filled 2023-08-28: qty 1
  Filled 2023-08-28 (×11): qty 2

## 2023-08-28 MED ORDER — ONDANSETRON HCL 4 MG PO TABS
4.0000 mg | ORAL_TABLET | Freq: Four times a day (QID) | ORAL | Status: DC | PRN
  Administered 2023-09-09 – 2023-09-16 (×6): 4 mg via ORAL
  Filled 2023-08-28 (×6): qty 1

## 2023-08-28 MED ORDER — MONTELUKAST SODIUM 10 MG PO TABS
10.0000 mg | ORAL_TABLET | Freq: Every day | ORAL | Status: DC
  Administered 2023-08-28 – 2023-09-17 (×21): 10 mg via ORAL
  Filled 2023-08-28 (×21): qty 1

## 2023-08-28 MED ORDER — POTASSIUM CHLORIDE IN NACL 20-0.9 MEQ/L-% IV SOLN
INTRAVENOUS | Status: AC
  Administered 2023-08-28: 100 mL/h via INTRAVENOUS
  Filled 2023-08-28 (×3): qty 1000

## 2023-08-28 MED ORDER — BUSPIRONE HCL 15 MG PO TABS
7.5000 mg | ORAL_TABLET | Freq: Two times a day (BID) | ORAL | Status: DC
  Administered 2023-08-29 – 2023-09-18 (×41): 7.5 mg via ORAL
  Filled 2023-08-28 (×11): qty 1
  Filled 2023-08-28: qty 2
  Filled 2023-08-28 (×4): qty 1
  Filled 2023-08-28: qty 2
  Filled 2023-08-28 (×26): qty 1

## 2023-08-28 MED ORDER — OXYCODONE HCL ER 15 MG PO T12A
15.0000 mg | EXTENDED_RELEASE_TABLET | Freq: Two times a day (BID) | ORAL | Status: DC
  Administered 2023-08-28 – 2023-09-18 (×42): 15 mg via ORAL
  Filled 2023-08-28 (×44): qty 1

## 2023-08-28 MED ORDER — TIOTROPIUM BROMIDE MONOHYDRATE 18 MCG IN CAPS: 1.0000 | ORAL_CAPSULE | Freq: Every day | RESPIRATORY_TRACT | Status: DC

## 2023-08-28 MED ORDER — ENOXAPARIN SODIUM 30 MG/0.3ML IJ SOSY
30.0000 mg | PREFILLED_SYRINGE | INTRAMUSCULAR | Status: DC
  Administered 2023-08-28 – 2023-08-29 (×2): 30 mg via SUBCUTANEOUS
  Filled 2023-08-28 (×2): qty 0.3

## 2023-08-28 MED ORDER — SODIUM CHLORIDE 0.9 % IV BOLUS
1000.0000 mL | Freq: Once | INTRAVENOUS | Status: AC
  Administered 2023-08-28: 1000 mL via INTRAVENOUS

## 2023-08-28 MED ORDER — SODIUM CHLORIDE 0.9 % IV BOLUS
500.0000 mL | Freq: Once | INTRAVENOUS | Status: AC
  Administered 2023-08-28: 500 mL via INTRAVENOUS

## 2023-08-28 MED ORDER — CYCLOBENZAPRINE HCL 10 MG PO TABS
10.0000 mg | ORAL_TABLET | Freq: Every day | ORAL | Status: DC
  Administered 2023-08-28 – 2023-09-17 (×21): 10 mg via ORAL
  Filled 2023-08-28 (×21): qty 1

## 2023-08-28 MED ORDER — TRAZODONE HCL 50 MG PO TABS
25.0000 mg | ORAL_TABLET | Freq: Every evening | ORAL | Status: DC | PRN
  Administered 2023-08-31 – 2023-09-14 (×4): 25 mg via ORAL
  Filled 2023-08-28 (×4): qty 1

## 2023-08-28 MED ORDER — IPRATROPIUM-ALBUTEROL 0.5-2.5 (3) MG/3ML IN SOLN
3.0000 mL | Freq: Four times a day (QID) | RESPIRATORY_TRACT | Status: DC | PRN
  Administered 2023-08-30 – 2023-09-07 (×4): 3 mL via RESPIRATORY_TRACT
  Filled 2023-08-28 (×4): qty 3

## 2023-08-28 MED ORDER — ACETAMINOPHEN 650 MG RE SUPP: 650.0000 mg | Freq: Four times a day (QID) | RECTAL | Status: DC | PRN

## 2023-08-28 MED ORDER — FLUTICASONE PROPIONATE 50 MCG/ACT NA SUSP
2.0000 | Freq: Every day | NASAL | Status: DC
  Administered 2023-08-31 – 2023-09-18 (×9): 2 via NASAL
  Filled 2023-08-28 (×2): qty 16

## 2023-08-28 MED ORDER — RIZATRIPTAN BENZOATE 10 MG PO TBDP: 10.0000 mg | ORAL_TABLET | Freq: Once | ORAL | Status: DC | PRN

## 2023-08-28 MED ORDER — HYDROXYZINE HCL 25 MG PO TABS
25.0000 mg | ORAL_TABLET | Freq: Two times a day (BID) | ORAL | Status: DC
  Administered 2023-08-29 (×2): 25 mg via ORAL
  Filled 2023-08-28 (×2): qty 1

## 2023-08-28 MED ORDER — CARBIDOPA-LEVODOPA 25-100 MG PO TABS
1.5000 | ORAL_TABLET | Freq: Three times a day (TID) | ORAL | Status: DC
  Administered 2023-08-28 – 2023-09-18 (×62): 1.5 via ORAL
  Filled 2023-08-28 (×8): qty 2
  Filled 2023-08-28: qty 1.5
  Filled 2023-08-28: qty 2
  Filled 2023-08-28: qty 1.5
  Filled 2023-08-28 (×38): qty 2
  Filled 2023-08-28: qty 1.5
  Filled 2023-08-28 (×5): qty 2
  Filled 2023-08-28: qty 1.5
  Filled 2023-08-28 (×8): qty 2

## 2023-08-28 MED ORDER — ONDANSETRON HCL 4 MG/2ML IJ SOLN
4.0000 mg | Freq: Four times a day (QID) | INTRAMUSCULAR | Status: DC | PRN
  Administered 2023-09-09 – 2023-09-18 (×12): 4 mg via INTRAVENOUS
  Filled 2023-08-28 (×15): qty 2

## 2023-08-28 MED ORDER — CETIRIZINE HCL 10 MG PO TABS
10.0000 mg | ORAL_TABLET | Freq: Every evening | ORAL | Status: DC
  Filled 2023-08-28 (×2): qty 1

## 2023-08-28 MED ORDER — SODIUM CHLORIDE 0.9 % IV BOLUS: 500.0000 mL | Freq: Once | INTRAVENOUS | Status: DC

## 2023-08-28 MED ORDER — COLCHICINE 0.6 MG PO TABS
0.6000 mg | ORAL_TABLET | Freq: Every day | ORAL | Status: DC
  Administered 2023-08-29 – 2023-09-18 (×18): 0.6 mg via ORAL
  Filled 2023-08-28 (×21): qty 1

## 2023-08-28 MED ORDER — ACETAMINOPHEN 325 MG PO TABS
650.0000 mg | ORAL_TABLET | Freq: Four times a day (QID) | ORAL | Status: DC | PRN
  Administered 2023-09-04 – 2023-09-16 (×9): 650 mg via ORAL
  Filled 2023-08-28 (×9): qty 2

## 2023-08-28 MED ORDER — PANTOPRAZOLE SODIUM 40 MG PO TBEC
40.0000 mg | DELAYED_RELEASE_TABLET | Freq: Every day | ORAL | Status: DC
  Administered 2023-08-29 – 2023-09-18 (×21): 40 mg via ORAL
  Filled 2023-08-28 (×21): qty 1

## 2023-08-28 MED ORDER — LUBIPROSTONE 24 MCG PO CAPS: 24.0000 ug | ORAL_CAPSULE | Freq: Two times a day (BID) | ORAL | Status: DC | PRN

## 2023-08-28 MED ORDER — MORPHINE SULFATE 15 MG PO TABS: 15.0000 mg | ORAL_TABLET | Freq: Four times a day (QID) | ORAL | Status: DC | PRN

## 2023-08-28 MED ORDER — ROSUVASTATIN CALCIUM 5 MG PO TABS
5.0000 mg | ORAL_TABLET | Freq: Every day | ORAL | Status: DC
  Administered 2023-08-28: 5 mg via ORAL
  Filled 2023-08-28: qty 1

## 2023-08-28 MED ORDER — QUETIAPINE FUMARATE 25 MG PO TABS
25.0000 mg | ORAL_TABLET | Freq: Every day | ORAL | Status: DC
  Administered 2023-08-29 – 2023-09-17 (×21): 25 mg via ORAL
  Filled 2023-08-28 (×21): qty 1

## 2023-08-28 MED ORDER — THEOPHYLLINE ER 400 MG PO TB24
400.0000 mg | ORAL_TABLET | Freq: Every day | ORAL | Status: DC
  Administered 2023-08-29 – 2023-09-18 (×21): 400 mg via ORAL
  Filled 2023-08-28 (×22): qty 1

## 2023-08-28 MED ORDER — GABAPENTIN 400 MG PO CAPS
1200.0000 mg | ORAL_CAPSULE | Freq: Three times a day (TID) | ORAL | Status: DC
  Filled 2023-08-28 (×2): qty 3

## 2023-08-28 MED ORDER — LORATADINE 10 MG PO TABS: 10.0000 mg | ORAL_TABLET | Freq: Every day | ORAL | Status: DC

## 2023-08-28 MED ORDER — CARBIDOPA-LEVODOPA ER 50-200 MG PO TBCR
1.0000 | EXTENDED_RELEASE_TABLET | Freq: Every day | ORAL | Status: DC
  Administered 2023-08-28 – 2023-09-17 (×21): 1 via ORAL
  Filled 2023-08-28 (×22): qty 1

## 2023-08-28 MED ORDER — LIDOCAINE 5 % EX PTCH
1.0000 | MEDICATED_PATCH | Freq: Two times a day (BID) | CUTANEOUS | Status: DC | PRN
  Administered 2023-09-10 – 2023-09-17 (×4): 1 via TRANSDERMAL
  Filled 2023-08-28 (×4): qty 1

## 2023-08-28 MED ORDER — IPRATROPIUM BROMIDE 0.06 % NA SOLN
2.0000 | Freq: Four times a day (QID) | NASAL | Status: DC
  Administered 2023-08-29 – 2023-09-18 (×35): 2 via NASAL
  Filled 2023-08-28 (×2): qty 15

## 2023-08-28 NOTE — ED Triage Notes (Signed)
 Pt sts that she fallen and has been on the floor since last night. Pt sts that she suddenly got weak and collapsed.

## 2023-08-28 NOTE — ED Triage Notes (Signed)
 Per EMS, patient had unwitnessed fall with no LOC but does not remember fall. EMS reports malodorous urine.  BG 123 Temp 97.2 20G LAC with 500cc NS

## 2023-08-28 NOTE — ED Notes (Signed)
 Pt placed on bed pan, call bell near pt.

## 2023-08-28 NOTE — H&P (Incomplete)
 [place]   PATIENT NAME: Tricia [name]    MR#:  [id number]  DATE OF BIRTH:  May 17, 1960  DATE OF ADMISSION:  08/28/2023  PRIMARY CARE PHYSICIAN: Sowles, Krichna, MD   Patient is coming from: ***  REQUESTING/REFERRING PHYSICIAN: ***  CHIEF COMPLAINT:   Chief Complaint  Patient presents with   Fall    HISTORY OF PRESENT ILLNESS:  Tricia [name] is a 63 y.o. female with medical history significant for ***  ED Course: *** EKG as reviewed by me : *** Imaging: *** PAST MEDICAL HISTORY:   Past Medical History:  Diagnosis Date   Anxiety    Arthritis    joints and hands/ knees   Asthma    uses inhaler   Benign essential tremor    head   Cervical dystonia    neck pain   Cholesteatoma of left ear    x2   COPD (chronic obstructive pulmonary disease) (HCC)    Cough    Depression    Diabetes mellitus without complication (HCC)    type 2   Diastolic dysfunction    Dyspnea    Dysrhythmia    diastolic dysfunction   GERD (gastroesophageal reflux disease)    Headache    migraines/ one per week   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   Hyperlipidemia    Hypertension    Motion sickness    boat   Neuromuscular disorder (HCC)    neuropathy feet and hands( nerve damage)   Wears dentures    upper and lower    PAST SURGICAL HISTORY:   Past Surgical History:  Procedure Laterality Date   CARPAL TUNNEL RELEASE Bilateral    x2 right, 1x on left   COLONOSCOPY     COLONOSCOPY WITH PROPOFOL  N/A 04/25/2017   Procedure: COLONOSCOPY WITH PROPOFOL ;  Surgeon: Jinny Carmine, MD;  Location: [facility];  Service: Endoscopy;  Laterality: N/A;  diabetic-oral med   DILATION AND CURETTAGE OF UTERUS     ESOPHAGOGASTRODUODENOSCOPY (EGD) WITH PROPOFOL  N/A 01/10/2021   Procedure: ESOPHAGOGASTRODUODENOSCOPY (EGD) WITH PROPOFOL ;  Surgeon: Janalyn Keene NOVAK, MD;  Location: [facility];  Service: Endoscopy;  Laterality: N/A;  Diabetic    EXTERNAL EAR SURGERY Left    x2   POLYPECTOMY  04/25/2017   Procedure: POLYPECTOMY INTESTINAL;  Surgeon: Jinny Carmine, MD;  Location: [facility];  Service: Endoscopy;;   SPINE SURGERY     herniated disc   TUBAL LIGATION      SOCIAL HISTORY:   Social History   Tobacco Use   Smoking status: Every Day    Current packs/day: 1.00    Average packs/day: 1 pack/day for 46.3 years (46.3 ttl pk-yrs)    Types: Cigarettes    Start date: 05/19/1977   Smokeless tobacco: Never  Substance Use Topics   Alcohol use: No    Alcohol/week: 0.0 standard drinks of alcohol    FAMILY HISTORY:   Family History  Problem Relation Age of Onset   Emphysema Mother    Anxiety disorder Mother    Stroke Father    Throat cancer Father    Lung cancer Maternal Grandmother    Lung cancer Maternal Grandfather    Hypertension Daughter    Diabetes Daughter    Multiple sclerosis Daughter    Bipolar disorder Daughter    Cervical cancer Daughter    Bipolar disorder Daughter    Drug abuse Daughter    Lung cancer  Maternal Aunt    Lung cancer Maternal Uncle     DRUG ALLERGIES:   Allergies  Allergen Reactions   Augmentin [Amoxicillin-Pot Clavulanate] Diarrhea   Penicillins Itching    REVIEW OF SYSTEMS:   ROS As per history of present illness. All pertinent systems were reviewed above. Constitutional, HEENT, cardiovascular, respiratory, GI, GU, musculoskeletal, neuro, psychiatric, endocrine, integumentary and hematologic systems were reviewed and are otherwise negative/unremarkable except for positive findings mentioned above in the HPI.   MEDICATIONS AT HOME:   Prior to Admission medications   Medication Sig Start Date End Date Taking? Authorizing Provider  busPIRone  (BUSPAR ) 7.5 MG tablet Take 7.5 mg by mouth 2 (two) times daily. 03/01/23  Yes [provider]  carbidopa -levodopa  (SINEMET  CR) 50-200 MG tablet Take 1 tablet by mouth at bedtime. 07/26/23 07/25/24 Yes [provider]   carbidopa -levodopa  (SINEMET  IR) 25-100 MG tablet Take 1.5 tablets by mouth 3 (three) times daily. 01/25/20 08/28/23 Yes Maree Jannett POUR, MD  colchicine  0.6 MG tablet TAKE 1 TABLET BY MOUTH ONCE DAILY 07/12/23  Yes Sowles, Krichna, MD  cyclobenzaprine  (FLEXERIL ) 10 MG tablet Take 10 mg by mouth at bedtime. 08/01/21  Yes [provider]  DULoxetine  (CYMBALTA ) 60 MG capsule Take 1 capsule (60 mg total) by mouth daily. 05/01/23  Yes Sowles, Krichna, MD  empagliflozin  (JARDIANCE ) 25 MG TABS tablet Take 1 tablet (25 mg total) by mouth daily before breakfast. 05/01/23  Yes Sowles, Krichna, MD  furosemide  (LASIX ) 40 MG tablet Take 1 tablet (40 mg total) by mouth daily as needed. 06/21/17  Yes [name], Almarie BRAVO, NP  hydrOXYzine  (ATARAX ) 25 MG tablet Take 25 mg by mouth 2 (two) times daily. 04/04/23  Yes [provider]  levocetirizine (XYZAL ) 5 MG tablet Take 1 tablet (5 mg total) by mouth every evening. 05/01/23  Yes Sowles, Krichna, MD  lidocaine  (LIDODERM ) 5 % 1 patch every 12 (twelve) hours as needed. 02/02/20  Yes [provider]  montelukast  (SINGULAIR ) 10 MG tablet Take 1 tablet by mouth at bedtime. 02/21/21  Yes Theotis Lavelle BRAVO, MD  morphine  (MSIR) 15 MG tablet Take 15 mg by mouth every 6 (six) hours as needed for severe pain (pain score 7-10) or moderate pain (pain score 4-6).   Yes [provider]  omeprazole  (PRILOSEC) 40 MG capsule Take 1 capsule (40 mg total) by mouth daily. 05/01/23  Yes Sowles, Krichna, MD  ondansetron  (ZOFRAN ) 4 MG tablet Take 1 tablet (4 mg total) by mouth daily as needed for nausea or vomiting. 11/01/22  Yes Sowles, Krichna, MD  QUEtiapine  (SEROQUEL ) 25 MG tablet Take 1 tablet (25 mg total) by mouth at bedtime. 05/01/23  Yes Sowles, Krichna, MD  rosuvastatin  (CRESTOR ) 5 MG tablet Take 1 tablet (5 mg total) by mouth at bedtime. 05/01/23  Yes Sowles, Krichna, MD  SYMBICORT  160-4.5 MCG/ACT inhaler INHALE 2 PUFFS TWICE A DAY RINSE MOUTH WITH WATER AFTER  EACH USE 07/09/22  Yes Sowles, Krichna, MD  theophylline  (UNIPHYL) 400 MG 24 hr tablet Take 1 tablet by mouth daily.  12/27/17  Yes Fleming, Herbon E, MD  tiotropium (SPIRIVA ) 18 MCG inhalation capsule Place 1 capsule into inhaler and inhale daily. pm 08/27/13  Yes Theotis Lavelle BRAVO, MD  VENTOLIN  HFA 108 (90 Base) MCG/ACT inhaler INHALE 1 PUFF BY MOUTH AS NEEDED 04/26/20  Yes Sowles, Krichna, MD  XTAMPZA  ER 13.5 MG C12A Take 13.5 mg by mouth every 8 (eight) hours. 04/12/23  Yes [provider]  cetirizine  (ZYRTEC ) 10 MG  tablet Take 10 mg by mouth daily as needed for allergies.    [provider]  clindamycin (CLEOCIN) 150 MG capsule Take 150 mg by mouth 3 (three) times daily. Patient not taking: Reported on 08/28/2023 06/18/23   [provider]  fluticasone  (FLONASE ) 50 MCG/ACT nasal spray Place 2 sprays into both nostrils daily. 10/25/21   Sowles, Krichna, MD  gabapentin  (NEURONTIN ) 600 MG tablet Take 1,200 mg by mouth 3 (three) times daily.    [provider]  ibuprofen (ADVIL) 600 MG tablet Take by mouth. 10/06/19   [provider]  ipratropium (ATROVENT ) 0.06 % nasal spray Place 2 sprays into both nostrils 4 (four) times daily. 10/25/21   Sowles, Krichna, MD  ipratropium-albuterol  (DUONEB) 0.5-2.5 (3) MG/3ML SOLN Inhale 3 mLs into the lungs every 6 (six) hours as needed. 06/21/17   [name], Almarie BRAVO, NP  lubiprostone  (AMITIZA ) 24 MCG capsule Take by mouth 2 (two) times daily as needed. 02/05/20   [provider]  OXYCODONE  HCL PO Take 18 mg by mouth 4 (four) times daily as needed. 10/12/19   [provider]  predniSONE  (DELTASONE ) 10 MG tablet Take 1 tablet (10 mg total) by mouth 2 (two) times daily with a meal. Patient not taking: Reported on 08/28/2023 05/01/23   Sowles, Krichna, MD  rizatriptan  (MAXALT -MLT) 10 MG disintegrating tablet Take by mouth. 09/25/21   [provider]      VITAL SIGNS:  Blood pressure 101/73, pulse 93,  temperature 97.7 F (36.5 C), temperature source Oral, resp. rate 20, height 5' 8 (1.727 m), weight 71.7 kg, SpO2 98%.  PHYSICAL EXAMINATION:  Physical Exam  GENERAL:  63 y.o.-year-old patient lying in the bed with no acute distress.  EYES: Pupils equal, round, reactive to light and accommodation. No scleral icterus. Extraocular muscles intact.  HEENT: Head atraumatic, normocephalic. Oropharynx and nasopharynx clear.  NECK:  Supple, no jugular venous distention. No thyroid  enlargement, no tenderness.  LUNGS: Normal breath sounds bilaterally, no wheezing, rales,rhonchi or crepitation. No use of accessory muscles of respiration.  CARDIOVASCULAR: Regular rate and rhythm, S1, S2 normal. No murmurs, rubs, or gallops.  ABDOMEN: Soft, nondistended, nontender. Bowel sounds present. No organomegaly or mass.  EXTREMITIES: No pedal edema, cyanosis, or clubbing.  NEUROLOGIC: Cranial nerves II through XII are intact. Muscle strength 5/5 in all extremities. Sensation intact. Gait not checked.  PSYCHIATRIC: The patient is alert and oriented x 3.  Normal affect and good eye contact. SKIN: No obvious rash, lesion, or ulcer.   LABORATORY PANEL:   CBC Recent Labs  Lab 08/28/23 2159  WBC 7.7  HGB 9.3*  HCT 27.6*  PLT 190   ------------------------------------------------------------------------------------------------------------------  Chemistries  Recent Labs  Lab 08/28/23 1259 08/28/23 2159  NA 132*  --   K 3.0*  --   CL 96*  --   CO2 24  --   GLUCOSE 92  --   BUN 31*  --   CREATININE 2.55* 2.20*  CALCIUM  7.4*  --    ------------------------------------------------------------------------------------------------------------------  Cardiac Enzymes No results for input(s): TROPONINI in the last 168 hours. ------------------------------------------------------------------------------------------------------------------  RADIOLOGY:  CT Head Wo Contrast Result Date:  08/28/2023 CLINICAL DATA:  Unwitnessed fall EXAM: CT HEAD WITHOUT CONTRAST TECHNIQUE: Contiguous axial images were obtained from the base of the skull through the vertex without intravenous contrast. RADIATION DOSE REDUCTION: This exam was performed according to the departmental dose-optimization program which includes automated exposure control, adjustment of the mA and/or kV according to patient size and/or use  of iterative reconstruction technique. COMPARISON:  MRI 10/20/2013 FINDINGS: Brain: No acute territorial infarction, hemorrhage or intracranial mass. Mild atrophy. Nonenlarged ventricles Vascular: No hyperdense vessels.  Carotid vascular calcification Skull: Normal. Negative for fracture or focal lesion. Sinuses/Orbits: No acute finding. Other: None IMPRESSION: No CT evidence for acute intracranial abnormality. Mild atrophy. Electronically Signed   By: Luke Bun M.D.   On: 08/28/2023 17:53      IMPRESSION AND PLAN:  Assessment and Plan: No notes have been filed under this hospital service. Service: Hospitalist      DVT prophylaxis: Lovenox ***  Advanced Care Planning:  Code Status: full code***  Family Communication:  The plan of care was discussed in details with the patient (and family). I answered all questions. The patient agreed to proceed with the above mentioned plan. Further management will depend upon hospital course. Disposition Plan: Back to previous home environment Consults called: none***  All the records are reviewed and case discussed with ED provider.  Status is: Observation {Observation:23811}   At the time of the admission, it appears that the appropriate admission status for this patient is inpatient.  This is judged to be reasonable and necessary in order to provide the required intensity of service to ensure the patient's safety given the presenting symptoms, physical exam findings and initial radiographic and laboratory data in the context of comorbid  conditions.  The patient requires inpatient status due to high intensity of service, high risk of further deterioration and high frequency of surveillance required.  I certify that at the time of admission, it is my clinical judgment that the patient will require inpatient hospital care extending more than 2 midnights.                            Dispo: The patient is from: Home              Anticipated d/c is to: Home              Patient currently is not medically stable to d/c.              Difficult to place patient: No  Madison DELENA Peaches M.D on 08/28/2023 at 11:54 PM  [facility]   From 7 PM-7 AM, contact night-coverage [email or web address]  CC: Primary care physician; Sowles, Krichna, MD

## 2023-08-28 NOTE — ED Provider Notes (Signed)
 [facility] Provider Note    Event Date/Time   First MD Initiated Contact with Patient 08/28/23 1537     (approximate)   History   Fall   HPI  Tricia [name] is a 63 y.o. female with a history of COPD, diabetes, hypertension, hyperlipidemia, and GERD who presents with weakness after an apparent fall.  The patient states that she remembers watching TV in bed and then at some point earlier this morning she awoke on the floor.  She felt weak in her arms and legs and was too weak to get up.  She was on the floor for about 4 to 5 hours before she was able to get help.  Currently she feels somewhat weak still.  She has some back pain.  She denies any acute injuries.  She has no headache or vomiting.  She denies any increase or worsening difficulty breathing.  I reviewed the past medical records.  [name] most recent outpatient count was on 4/29 with Dr. Theotis from pulmonology for follow-up of her COPD.  She has not had any recent hospitalizations.   Physical Exam   Triage Vital Signs: ED Triage Vitals  Encounter Vitals Group     BP 08/28/23 1255 (!) 125/100     Girls Systolic BP Percentile --      Girls Diastolic BP Percentile --      Boys Systolic BP Percentile --      Boys Diastolic BP Percentile --      Pulse Rate 08/28/23 1255 99     Resp 08/28/23 1255 18     Temp 08/28/23 1255 98.3 F (36.8 C)     Temp Source 08/28/23 1255 Oral     SpO2 08/28/23 1255 90 %     Weight 08/28/23 1256 158 lb (71.7 kg)     Height 08/28/23 1256 5' 8 (1.727 m)     Head Circumference --      Peak Flow --      Pain Score 08/28/23 1256 10     Pain Loc --      Pain Education --      Exclude from Growth Chart --     Most recent vital signs: Vitals:   08/28/23 2115 08/28/23 2121  BP: 101/73 101/73  Pulse: (!) 39 93  Resp: 20 20  Temp:    SpO2: (!) 77% 98%     General: Alert and oriented, weak appearing, no distress.  CV:  Good peripheral perfusion.   Resp:  Normal effort.  Lungs CTAB. Abd:  Soft and nontender.  No distention.  Other:  No peripheral edema.  Dry mucous membranes.  EOMI.  PERRLA.  No photophobia.  Normal speech.  No facial droop.  Motor intact in all extremities.  No ataxia.  No pronator drift.   ED Results / Procedures / Treatments   Labs (all labs ordered are listed, but only abnormal results are displayed) Labs Reviewed  CK - Abnormal; Notable for the following components:      Result Value   Total CK 659 (*)    All other components within normal limits  BASIC METABOLIC PANEL WITH GFR - Abnormal; Notable for the following components:   Sodium 132 (*)    Potassium 3.0 (*)    Chloride 96 (*)    BUN 31 (*)    Creatinine, Ser 2.55 (*)    Calcium  7.4 (*)    GFR, Estimated 21 (*)    All other  components within normal limits  CBC - Abnormal; Notable for the following components:   RBC 3.41 (*)    Hemoglobin 10.8 (*)    HCT 30.5 (*)    All other components within normal limits  CBC - Abnormal; Notable for the following components:   RBC 3.01 (*)    Hemoglobin 9.3 (*)    HCT 27.6 (*)    All other components within normal limits  CREATININE, SERUM - Abnormal; Notable for the following components:   Creatinine, Ser 2.20 (*)    GFR, Estimated 25 (*)    All other components within normal limits  URINALYSIS, ROUTINE W REFLEX MICROSCOPIC  HIV ANTIBODY (ROUTINE TESTING W REFLEX)  BASIC METABOLIC PANEL WITH GFR  CBC  TROPONIN I (HIGH SENSITIVITY)     EKG  ED ECG REPORT I, Waylon Cassis, the attending physician, personally viewed and interpreted this ECG.  Date: 08/28/2023 EKG Time: 1300 Rate: 96 Rhythm: normal sinus rhythm QRS Axis: normal Intervals: normal ST/T Wave abnormalities: normal Narrative Interpretation: no evidence of acute ischemia    RADIOLOGY  CT head: I independently viewed and interpreted the images; there is no ICH.  Radiology report indicates no acute  abnormality.   PROCEDURES:  Critical Care performed: No  Procedures   MEDICATIONS ORDERED IN ED: Medications  enoxaparin  (LOVENOX ) injection 30 mg (30 mg Subcutaneous Given 08/28/23 2223)  0.9 % NaCl with KCl 20 mEq/ L  infusion (100 mL/hr Intravenous New Bag/Given 08/28/23 2111)  acetaminophen  (TYLENOL ) tablet 650 mg (has no administration in time range)    Or  acetaminophen  (TYLENOL ) suppository 650 mg (has no administration in time range)  traZODone  (DESYREL ) tablet 25 mg (has no administration in time range)  magnesium  hydroxide (MILK OF MAGNESIA) suspension 30 mL (has no administration in time range)  ondansetron  (ZOFRAN ) tablet 4 mg (has no administration in time range)    Or  ondansetron  (ZOFRAN ) injection 4 mg (has no administration in time range)  colchicine  tablet 0.6 mg (has no administration in time range)  morphine  (MSIR) tablet 15 mg (has no administration in time range)  rizatriptan  (MAXALT -MLT) disintegrating tablet 10 mg (has no administration in time range)  oxyCODONE  (OXYCONTIN ) 12 hr tablet 15 mg (has no administration in time range)  rosuvastatin  (CRESTOR ) tablet 5 mg (has no administration in time range)  busPIRone  (BUSPAR ) tablet 7.5 mg (has no administration in time range)  DULoxetine  (CYMBALTA ) DR capsule 60 mg (has no administration in time range)  hydrOXYzine  (ATARAX ) tablet 25 mg (has no administration in time range)  QUEtiapine  (SEROQUEL ) tablet 25 mg (has no administration in time range)  empagliflozin  (JARDIANCE ) tablet 25 mg (has no administration in time range)  lubiprostone  (AMITIZA ) capsule 24 mcg (has no administration in time range)  pantoprazole  (PROTONIX ) EC tablet 40 mg (has no administration in time range)  carbidopa -levodopa  (SINEMET  CR) 50-200 MG per tablet controlled release 1 tablet (has no administration in time range)  carbidopa -levodopa  (SINEMET  IR) 25-100 MG per tablet immediate release 1.5 tablet (has no administration in time range)   cyclobenzaprine  (FLEXERIL ) tablet 10 mg (has no administration in time range)  gabapentin  (NEURONTIN ) tablet 1,200 mg (has no administration in time range)  fluticasone  (FLONASE ) 50 MCG/ACT nasal spray 2 spray (has no administration in time range)  ipratropium (ATROVENT ) 0.06 % nasal spray 2 spray (has no administration in time range)  cetirizine  (ZYRTEC ) tablet 10 mg (has no administration in time range)  ipratropium-albuterol  (DUONEB) 0.5-2.5 (3) MG/3ML nebulizer solution 3 mL (has no  administration in time range)  montelukast  (SINGULAIR ) tablet 10 mg (has no administration in time range)  theophylline  (UNIPHYL) 400 MG 24 hr tablet 400 mg (has no administration in time range)  tiotropium (SPIRIVA ) inhalation capsule ([facility] use ONLY) 18 mcg (has no administration in time range)  lidocaine  (LIDODERM ) 5 % 1 patch (has no administration in time range)  sodium chloride  0.9 % bolus 1,000 mL (0 mLs Intravenous Stopped 08/28/23 2045)  sodium chloride  0.9 % bolus 1,000 mL (1,000 mLs Intravenous New Bag/Given 08/28/23 2256)  sodium chloride  0.9 % bolus 500 mL (0 mLs Intravenous Stopped 08/28/23 2210)     IMPRESSION / MDM / ASSESSMENT AND PLAN / ED COURSE  I reviewed the triage vital signs and the nursing notes.  63 year old female with PMH as noted above presents after she awoke on the floor after an apparent fall out of bed.  Based on her story it is unclear if this is syncope or a mechanical fall while she was asleep.  However she was too weak to get up.  On exam her vital signs are normal.  Neurologic exam is nonfocal.  She has no acute trauma but is somewhat weak appearing.  Differential diagnosis includes, but is not limited to, dehydration, electrolyte abnormality, other metabolic disturbance, rhabdomyolysis, cardiac dysrhythmia, less likely CNS cause.    Initial labs are significant for creatinine of 2.5 (up from 1.2 less than a year ago) and CK of 659.  Troponin is negative.  We will obtain a  urinalysis, CT head, give a fluid bolus, and reassess.  Patient's presentation is most consistent with acute presentation with potential threat to life or bodily function.  The patient is on the cardiac monitor to evaluate for evidence of arrhythmia and/or significant heart rate changes.   ----------------------------------------- 8:17 PM on 08/28/2023 -----------------------------------------  CT head is negative.  Urinalysis is still pending.  The patient blood pressures remain borderline despite fluids.  She will need admission for further management.  I consulted Dr. Lawence from the hospitalist service; based on our discussion he agrees to evaluate the patient for admission.   FINAL CLINICAL IMPRESSION(S) / ED DIAGNOSES   Final diagnoses:  Fall, initial encounter  Generalized weakness  AKI (acute kidney injury) (HCC)  Non-traumatic rhabdomyolysis     Rx / DC Orders   ED Discharge Orders     None        Note:  This document was prepared using Dragon voice recognition software and may include unintentional dictation errors.    Jacolyn Pae, MD 08/28/23 2328

## 2023-08-29 DIAGNOSIS — E876 Hypokalemia: Secondary | ICD-10-CM | POA: Diagnosis not present

## 2023-08-29 DIAGNOSIS — F419 Anxiety disorder, unspecified: Secondary | ICD-10-CM | POA: Diagnosis present

## 2023-08-29 DIAGNOSIS — R748 Abnormal levels of other serum enzymes: Secondary | ICD-10-CM | POA: Diagnosis not present

## 2023-08-29 DIAGNOSIS — R55 Syncope and collapse: Secondary | ICD-10-CM | POA: Diagnosis not present

## 2023-08-29 DIAGNOSIS — E1140 Type 2 diabetes mellitus with diabetic neuropathy, unspecified: Secondary | ICD-10-CM | POA: Diagnosis present

## 2023-08-29 DIAGNOSIS — I130 Hypertensive heart and chronic kidney disease with heart failure and stage 1 through stage 4 chronic kidney disease, or unspecified chronic kidney disease: Secondary | ICD-10-CM | POA: Diagnosis present

## 2023-08-29 DIAGNOSIS — G20A1 Parkinson's disease without dyskinesia, without mention of fluctuations: Secondary | ICD-10-CM | POA: Diagnosis present

## 2023-08-29 DIAGNOSIS — F4329 Adjustment disorder with other symptoms: Secondary | ICD-10-CM | POA: Diagnosis not present

## 2023-08-29 DIAGNOSIS — N179 Acute kidney failure, unspecified: Secondary | ICD-10-CM | POA: Diagnosis not present

## 2023-08-29 DIAGNOSIS — K219 Gastro-esophageal reflux disease without esophagitis: Secondary | ICD-10-CM

## 2023-08-29 DIAGNOSIS — J4489 Other specified chronic obstructive pulmonary disease: Secondary | ICD-10-CM | POA: Diagnosis present

## 2023-08-29 DIAGNOSIS — M6282 Rhabdomyolysis: Secondary | ICD-10-CM | POA: Diagnosis present

## 2023-08-29 DIAGNOSIS — R531 Weakness: Secondary | ICD-10-CM | POA: Diagnosis present

## 2023-08-29 DIAGNOSIS — F1120 Opioid dependence, uncomplicated: Secondary | ICD-10-CM | POA: Diagnosis present

## 2023-08-29 DIAGNOSIS — B964 Proteus (mirabilis) (morganii) as the cause of diseases classified elsewhere: Secondary | ICD-10-CM | POA: Diagnosis not present

## 2023-08-29 DIAGNOSIS — E1122 Type 2 diabetes mellitus with diabetic chronic kidney disease: Secondary | ICD-10-CM | POA: Diagnosis present

## 2023-08-29 DIAGNOSIS — N1831 Chronic kidney disease, stage 3a: Secondary | ICD-10-CM | POA: Diagnosis present

## 2023-08-29 DIAGNOSIS — E119 Type 2 diabetes mellitus without complications: Secondary | ICD-10-CM | POA: Insufficient documentation

## 2023-08-29 DIAGNOSIS — F17210 Nicotine dependence, cigarettes, uncomplicated: Secondary | ICD-10-CM | POA: Diagnosis present

## 2023-08-29 DIAGNOSIS — B961 Klebsiella pneumoniae [K. pneumoniae] as the cause of diseases classified elsewhere: Secondary | ICD-10-CM | POA: Diagnosis not present

## 2023-08-29 DIAGNOSIS — N170 Acute kidney failure with tubular necrosis: Secondary | ICD-10-CM | POA: Diagnosis present

## 2023-08-29 DIAGNOSIS — E860 Dehydration: Secondary | ICD-10-CM | POA: Diagnosis present

## 2023-08-29 DIAGNOSIS — E440 Moderate protein-calorie malnutrition: Secondary | ICD-10-CM | POA: Diagnosis present

## 2023-08-29 DIAGNOSIS — Y92230 Patient room in hospital as the place of occurrence of the external cause: Secondary | ICD-10-CM | POA: Diagnosis not present

## 2023-08-29 DIAGNOSIS — J449 Chronic obstructive pulmonary disease, unspecified: Secondary | ICD-10-CM | POA: Insufficient documentation

## 2023-08-29 DIAGNOSIS — E785 Hyperlipidemia, unspecified: Secondary | ICD-10-CM | POA: Diagnosis present

## 2023-08-29 DIAGNOSIS — E8809 Other disorders of plasma-protein metabolism, not elsewhere classified: Secondary | ICD-10-CM | POA: Diagnosis present

## 2023-08-29 DIAGNOSIS — W06XXXA Fall from bed, initial encounter: Secondary | ICD-10-CM | POA: Diagnosis not present

## 2023-08-29 DIAGNOSIS — F32A Depression, unspecified: Secondary | ICD-10-CM | POA: Diagnosis present

## 2023-08-29 DIAGNOSIS — Z6824 Body mass index (BMI) 24.0-24.9, adult: Secondary | ICD-10-CM | POA: Diagnosis not present

## 2023-08-29 DIAGNOSIS — N390 Urinary tract infection, site not specified: Secondary | ICD-10-CM | POA: Diagnosis not present

## 2023-08-29 DIAGNOSIS — I5032 Chronic diastolic (congestive) heart failure: Secondary | ICD-10-CM | POA: Diagnosis present

## 2023-08-29 DIAGNOSIS — N189 Chronic kidney disease, unspecified: Secondary | ICD-10-CM | POA: Diagnosis not present

## 2023-08-29 LAB — BASIC METABOLIC PANEL WITH GFR
Anion gap: 11 (ref 5–15)
BUN: 30 mg/dL — ABNORMAL HIGH (ref 8–23)
CO2: 21 mmol/L — ABNORMAL LOW (ref 22–32)
Calcium: 7.1 mg/dL — ABNORMAL LOW (ref 8.9–10.3)
Chloride: 99 mmol/L (ref 98–111)
Creatinine, Ser: 2.36 mg/dL — ABNORMAL HIGH (ref 0.44–1.00)
GFR, Estimated: 23 mL/min — ABNORMAL LOW (ref >60)
Glucose, Bld: 101 mg/dL — ABNORMAL HIGH (ref 70–99)
Potassium: 3.1 mmol/L — ABNORMAL LOW (ref 3.5–5.1)
Sodium: 131 mmol/L — ABNORMAL LOW (ref 135–145)

## 2023-08-29 LAB — CBC
HCT: 28 % — ABNORMAL LOW (ref 36.0–46.0)
Hemoglobin: 9.7 g/dL — ABNORMAL LOW (ref 12.0–15.0)
MCH: 31 pg (ref 26.0–34.0)
MCHC: 34.6 g/dL (ref 30.0–36.0)
MCV: 89.5 fL (ref 80.0–100.0)
Platelets: 190 K/uL (ref 150–400)
RBC: 3.13 MIL/uL — ABNORMAL LOW (ref 3.87–5.11)
RDW: 14 % (ref 11.5–15.5)
WBC: 7.6 K/uL (ref 4.0–10.5)
nRBC: 0 % (ref 0.0–0.2)

## 2023-08-29 LAB — CK: Total CK: 547 U/L — ABNORMAL HIGH (ref 38–234)

## 2023-08-29 MED ORDER — UMECLIDINIUM BROMIDE 62.5 MCG/ACT IN AEPB
1.0000 | INHALATION_SPRAY | Freq: Every day | RESPIRATORY_TRACT | Status: DC
  Administered 2023-08-29 – 2023-09-18 (×14): 1 via RESPIRATORY_TRACT
  Filled 2023-08-29 (×3): qty 7

## 2023-08-29 MED ORDER — SUMATRIPTAN SUCCINATE 50 MG PO TABS: 100.0000 mg | ORAL_TABLET | ORAL | Status: DC | PRN

## 2023-08-29 MED ORDER — GABAPENTIN 400 MG PO CAPS
400.0000 mg | ORAL_CAPSULE | Freq: Three times a day (TID) | ORAL | Status: DC
Start: 2023-08-29 — End: 2023-09-18
  Administered 2023-08-29 – 2023-09-18 (×58): 400 mg via ORAL
  Filled 2023-08-29 (×59): qty 1

## 2023-08-29 NOTE — Assessment & Plan Note (Addendum)
-   Will hold off statin therapy given rhabdomyolysis

## 2023-08-29 NOTE — Assessment & Plan Note (Signed)
-   Will continue Cymbalta  and Seroquel  as well as BuSpar  and hydroxyzine .

## 2023-08-29 NOTE — ED Notes (Signed)
 While getting pts vitals noticed pt was wet and changed her

## 2023-08-29 NOTE — Assessment & Plan Note (Signed)
-   The patient will be placed on supplemental coverage with [employer]. - Will continue Jardiance.

## 2023-08-29 NOTE — ED Notes (Signed)
 Pharmacy messaged about missing dose of pt's due medications

## 2023-08-29 NOTE — Progress Notes (Signed)
 PROGRESS NOTE    Tricia [name]  FMW:[id number] DOB: 07/18/1960 DOA: 08/28/2023 PCP: Sowles, Krichna, MD  Chief Complaint  Patient presents with   [facility] Course:  Tricia [name] is a 63 year old female with chronic pain, COPD, asthma, osteoarthritis, anxiety, depression, type 2 diabetes, diastolic CHF, GERD, hypertension, dyslipidemia, who presents to the ED with syncopal episode.  Patient cannot elaborate to me on what occurred prior to her admission.  She remembers sitting down at home and then woke up in the hospital. Initial vitals in the ED are 125/100, creatinine elevation of 2.55, CK6 59.  Noncon head CT without acute intracranial abnormalities.  Patient received 2 L NS and was admitted for observation.  Subjective: On my evaluation this morning patient is very drowsy.  She does not recall any of the events that led to her hospitalization yesterday.  We discussed that she may be taking too many pain medications.  She endorses understanding.  She reports that she has multiple different places of pain including her back and her shoulder.  Objective: Vitals:   08/29/23 0954 08/29/23 1000 08/29/23 1230 08/29/23 1419  BP:  107/78 99/84   Pulse:  94 99   Resp:  15 19   Temp: 98.1 F (36.7 C)   98.1 F (36.7 C)  TempSrc: Oral     SpO2:  97% 98%   Weight:      Height:        Intake/Output Summary (Last 24 hours) at 08/29/2023 1636 Last data filed at 08/28/2023 2045 Gross per 24 hour  Intake 1000 ml  Output --  Net 1000 ml   Filed Weights   08/28/23 1256  Weight: 71.7 kg    Examination: General exam: Appears calm and comfortable, NAD  Respiratory system: No work of breathing, symmetric chest wall expansion Cardiovascular system: S1 & S2 heard, RRR.  Gastrointestinal system: Abdomen is nondistended, soft and nontender.  Neuro: Alert and oriented.  Speech is slurred and sometimes incoherent.  Is able to answer all questions and follow  commands Extremities: Symmetric, expected ROM Skin: No rashes, lesions Psychiatry: Demonstrates appropriate judgement and insight. Mood & affect appropriate for situation.   Assessment & Plan:  Principal Problem:   Acute kidney injury superimposed on chronic kidney disease (HCC) Active Problems:   Syncope, vasovagal   Rhabdomyolysis   Dyslipidemia   GERD without esophagitis   Parkinson disease (HCC)   Anxiety and depression   Chronic obstructive pulmonary disease (COPD) (HCC)   Type 2 diabetes mellitus with stage 3 chronic kidney disease (HCC)   Syncope - DDx: Orthostatic hypotension, arrhythmia, polypharmacy - Orthostatics negative - Echocardiogram ordered and pending - Rehydrated with 2 L NS - Continue to monitor on telemetry.  No arrhythmias noted thus far - PT/OT eval  Polypharmacy Chronic pain Opioid dependence - Patient is on multiple psychiatric medications, duplicate antihistamine therapy, as well as chronic opioid therapy - Home meds include: BuSpar , Cymbalta , Flexeril , Xyzal , gabapentin , methylnaltrexone, MS Contin , Seroquel , hydroxyzine . - For now we will discontinue hydroxyzine , decrease gabapentin  which is currently at 1200 mg TID - Avoid further medication changes to avoid withdrawal - Patient reports she occasionally lives with a friend.  I have significant concerns about her ability to manage all of these home meds appropriately and suspect some may be taken incorrectly. -Reviewed PDMP. - Have discussed with patient that we will need to deprescribed some of these medications.  Will need close outpatient follow-up with her pain management clinic.  Suspect these are too strong for her  AKI - Baseline creatinine less than 1 - Creatinine on arrival 2.5 - Status post IV fluids - Continue to monitor creatinine - Avoid nephrotoxic medications. Hold jardiance . Reduce gabapentin .  - Renally dosed the creatinine clearance of 24  Parkinson's disease - Continue home  dose carbidopa  levodopa .  Anxiety Depression - On multiple psychiatric meds.  See above. - Avoid withdrawal.  Resume dose for now  Type 2 diabetes - Most recent hemoglobin A1c 6.0%.  Well-controlled. - At home on Jardiance   Rhabdomyolysis - Status post IV fluids - Discontinue CK trend  GERD - Continue PPI  Dyslipidemia - On statin.  Hold given current rhabdo  COPD, not currently in exacerbation - Continue home meds   DVT prophylaxis: Lovenox    Code Status: Full Code Disposition: Admitted inpatient.  Pending resolution in AKI and safe dispo planning  Consultants:    Procedures:    Antimicrobials:  Anti-infectives (From admission, onward)    None       Data Reviewed: I have personally reviewed following labs and imaging studies CBC: Recent Labs  Lab 08/28/23 1259 08/28/23 2159 08/29/23 0225  WBC 9.8 7.7 7.6  HGB 10.8* 9.3* 9.7*  HCT 30.5* 27.6* 28.0*  MCV 89.4 91.7 89.5  PLT 231 190 190   Basic Metabolic Panel: Recent Labs  Lab 08/28/23 1259 08/28/23 2159 08/29/23 0225  NA 132*  --  131*  K 3.0*  --  3.1*  CL 96*  --  99  CO2 24  --  21*  GLUCOSE 92  --  101*  BUN 31*  --  30*  CREATININE 2.55* 2.20* 2.36*  CALCIUM  7.4*  --  7.1*   GFR: Estimated Creatinine Clearance: 24.9 mL/min (A) (by C-G formula based on SCr of 2.36 mg/dL (H)). Liver Function Tests: No results for input(s): AST, ALT, ALKPHOS, BILITOT, PROT, ALBUMIN in the last 168 hours. CBG: No results for input(s): GLUCAP in the last 168 hours.  No results found for this or any previous visit (from the past 240 hours).   Radiology Studies: CT Head Wo Contrast Result Date: 08/28/2023 CLINICAL DATA:  Unwitnessed fall EXAM: CT HEAD WITHOUT CONTRAST TECHNIQUE: Contiguous axial images were obtained from the base of the skull through the vertex without intravenous contrast. RADIATION DOSE REDUCTION: This exam was performed according to the departmental dose-optimization  program which includes automated exposure control, adjustment of the mA and/or kV according to patient size and/or use of iterative reconstruction technique. COMPARISON:  MRI 10/20/2013 FINDINGS: Brain: No acute territorial infarction, hemorrhage or intracranial mass. Mild atrophy. Nonenlarged ventricles Vascular: No hyperdense vessels.  Carotid vascular calcification Skull: Normal. Negative for fracture or focal lesion. Sinuses/Orbits: No acute finding. Other: None IMPRESSION: No CT evidence for acute intracranial abnormality. Mild atrophy. Electronically Signed   By: Luke Bun M.D.   On: 08/28/2023 17:53    Scheduled Meds:  busPIRone   7.5 mg Oral BID   carbidopa -levodopa   1 tablet Oral QHS   carbidopa -levodopa   1.5 tablet Oral TID   cetirizine   10 mg Oral QPM   colchicine   0.6 mg Oral Daily   cyclobenzaprine   10 mg Oral QHS   DULoxetine   60 mg Oral Daily   empagliflozin   10 mg Oral QAC breakfast   enoxaparin  (LOVENOX ) injection  30 mg Subcutaneous Q24H   fluticasone   2 spray Each Nare Daily   gabapentin   1,200 mg Oral TID   hydrOXYzine   25 mg Oral BID  ipratropium  2 spray Each Nare QID   montelukast   10 mg Oral QHS   oxyCODONE   15 mg Oral Q12H   pantoprazole   40 mg Oral Daily   QUEtiapine   25 mg Oral QHS   theophylline   400 mg Oral Daily   umeclidinium bromide   1 puff Inhalation Daily   Continuous Infusions:  0.9 % NaCl with KCl 20 mEq / L 100 mL/hr (08/28/23 2111)     LOS: 0 days  MDM: Patient is high risk for one or more organ failure.  They necessitate ongoing hospitalization for continued IV therapies and subsequent lab monitoring. Total time spent interpreting labs and vitals, reviewing the medical record, coordinating care amongst consultants and care team members, directly assessing and discussing care with the patient and/or family: 55 min  Kiona Blume, DO [facility]  To contact the attending physician between 7A-7P please use Epic Chat. To contact the  covering physician during after hours 7P-7A, please review Amion.  08/29/2023, 4:36 PM   *This document has been created with the assistance of dictation software. Please excuse typographical errors. *

## 2023-08-29 NOTE — Assessment & Plan Note (Signed)
-   The patient will be admitted to a medical telemetry observation bed. - This could be prerenal AKI due to volume depletion and dehydration. - Will continue hydration with IV normal saline. - Will avoid nephrotoxins. - Will follow BMPs.

## 2023-08-29 NOTE — Assessment & Plan Note (Signed)
-   This is secondary to her fall. - She will be hydrated with IV normal saline. - Will follow CK levels.

## 2023-08-29 NOTE — ED Notes (Signed)
Pt given lunch meal

## 2023-08-29 NOTE — Care Management Obs Status (Signed)
 MEDICARE OBSERVATION STATUS NOTIFICATION   Patient Details  Name: Tricia [name] MRN: [id number] Date of Birth: 16-Apr-1960   Medicare Observation Status Notification Given:   yes    Delphine KANDICE Bring, RN 08/29/2023, 11:56 AM

## 2023-08-29 NOTE — Assessment & Plan Note (Signed)
-   Will continue her inhalers.

## 2023-08-29 NOTE — Assessment & Plan Note (Signed)
-

## 2023-08-29 NOTE — ED Notes (Signed)
 Pt incontinent to bladder. Pt cleaned and new brief and sheets applied.

## 2023-08-29 NOTE — Assessment & Plan Note (Signed)
 Will continue PPI therapy.

## 2023-08-29 NOTE — Assessment & Plan Note (Signed)
-   Will check orthostatics q 12 hours. - Will obtain a bilateral 2D echo. - The patient will be gently hydrated with IV normal saline and monitored for arrhythmias. -Differential diagnoses would include neurally mediated syncope, cardiogenic, arrhythmias related,  orthostatic hypotension and less likely hypoglycemia.

## 2023-08-30 ENCOUNTER — Ambulatory Visit

## 2023-08-30 ENCOUNTER — Inpatient Hospital Stay (HOSPITAL_COMMUNITY)
Admit: 2023-08-30 | Discharge: 2023-08-30 | Disposition: A | Discharge status: Home / Self Care | Attending: Family Medicine

## 2023-08-30 DIAGNOSIS — R55 Syncope and collapse: Secondary | ICD-10-CM

## 2023-08-30 DIAGNOSIS — N189 Chronic kidney disease, unspecified: Secondary | ICD-10-CM | POA: Diagnosis not present

## 2023-08-30 DIAGNOSIS — N179 Acute kidney failure, unspecified: Secondary | ICD-10-CM | POA: Diagnosis not present

## 2023-08-30 DIAGNOSIS — M6282 Rhabdomyolysis: Secondary | ICD-10-CM | POA: Diagnosis not present

## 2023-08-30 LAB — COMPREHENSIVE METABOLIC PANEL WITH GFR
ALT: <5 U/L (ref 0–44)
AST: 54 U/L — ABNORMAL HIGH (ref 15–41)
Albumin: 1.9 g/dL — ABNORMAL LOW (ref 3.5–5.0)
Alkaline Phosphatase: 134 U/L — ABNORMAL HIGH (ref 38–126)
Anion gap: 9 (ref 5–15)
BUN: 24 mg/dL — ABNORMAL HIGH (ref 8–23)
CO2: 20 mmol/L — ABNORMAL LOW (ref 22–32)
Calcium: 7.4 mg/dL — ABNORMAL LOW (ref 8.9–10.3)
Chloride: 105 mmol/L (ref 98–111)
Creatinine, Ser: 1.61 mg/dL — ABNORMAL HIGH (ref 0.44–1.00)
GFR, Estimated: 36 mL/min — ABNORMAL LOW (ref >60)
Glucose, Bld: 105 mg/dL — ABNORMAL HIGH (ref 70–99)
Potassium: 2.9 mmol/L — ABNORMAL LOW (ref 3.5–5.1)
Sodium: 134 mmol/L — ABNORMAL LOW (ref 135–145)
Total Bilirubin: 2.3 mg/dL — ABNORMAL HIGH (ref 0.0–1.2)
Total Protein: 4.4 g/dL — ABNORMAL LOW (ref 6.5–8.1)

## 2023-08-30 LAB — CBC WITH DIFFERENTIAL/PLATELET
Abs Immature Granulocytes: 0.05 K/uL (ref 0.00–0.07)
Basophils Absolute: 0 K/uL (ref 0.0–0.1)
Basophils Relative: 0 %
Eosinophils Absolute: 0 K/uL (ref 0.0–0.5)
Eosinophils Relative: 0 %
HCT: 27.7 % — ABNORMAL LOW (ref 36.0–46.0)
Hemoglobin: 10.2 g/dL — ABNORMAL LOW (ref 12.0–15.0)
Immature Granulocytes: 1 %
Lymphocytes Relative: 13 %
Lymphs Abs: 1.1 K/uL (ref 0.7–4.0)
MCH: 32.3 pg (ref 26.0–34.0)
MCHC: 36.8 g/dL — ABNORMAL HIGH (ref 30.0–36.0)
MCV: 87.7 fL (ref 80.0–100.0)
Monocytes Absolute: 0.3 K/uL (ref 0.1–1.0)
Monocytes Relative: 4 %
Neutro Abs: 6.8 K/uL (ref 1.7–7.7)
Neutrophils Relative %: 82 %
Platelets: 201 K/uL (ref 150–400)
RBC: 3.16 MIL/uL — ABNORMAL LOW (ref 3.87–5.11)
RDW: 13.9 % (ref 11.5–15.5)
WBC: 8.2 K/uL (ref 4.0–10.5)
nRBC: 0 % (ref 0.0–0.2)

## 2023-08-30 LAB — URINALYSIS, ROUTINE W REFLEX MICROSCOPIC
Bilirubin Urine: NEGATIVE
Glucose, UA: NEGATIVE mg/dL
Ketones, ur: NEGATIVE mg/dL
Nitrite: NEGATIVE
Protein, ur: 30 mg/dL — AB
Specific Gravity, Urine: 1.012 (ref 1.005–1.030)
WBC, UA: >50 WBC/hpf (ref 0–5)
pH: 5 (ref 5.0–8.0)

## 2023-08-30 LAB — ECHOCARDIOGRAM COMPLETE
AR max vel: 2.35 cm2
AV Area VTI: 2.46 cm2
AV Area mean vel: 2.23 cm2
AV Mean grad: 3 mmHg
AV Peak grad: 4.8 mmHg
Ao pk vel: 1.1 m/s
Area-P 1/2: 5.23 cm2
Height: 68 in
MV VTI: 1.43 cm2
S' Lateral: 2.3 cm
Weight: 2528 [oz_av]

## 2023-08-30 LAB — MRSA NEXT GEN BY PCR, NASAL: MRSA by PCR Next Gen: DETECTED — AB

## 2023-08-30 LAB — HIV ANTIBODY (ROUTINE TESTING W REFLEX): HIV Screen 4th Generation wRfx: NONREACTIVE

## 2023-08-30 MED ORDER — POTASSIUM CHLORIDE CRYS ER 20 MEQ PO TBCR
40.0000 meq | EXTENDED_RELEASE_TABLET | ORAL | Status: AC
  Administered 2023-08-30 (×3): 40 meq via ORAL
  Filled 2023-08-30 (×3): qty 2

## 2023-08-30 MED ORDER — ENOXAPARIN SODIUM 40 MG/0.4ML IJ SOSY
40.0000 mg | PREFILLED_SYRINGE | INTRAMUSCULAR | Status: DC
  Administered 2023-08-30 – 2023-09-17 (×19): 40 mg via SUBCUTANEOUS
  Filled 2023-08-30 (×19): qty 0.4

## 2023-08-30 NOTE — Progress Notes (Signed)
 Anticoagulation monitoring(Lovenox ):  62yo  F ordered Lovenox  30 mg Q24h    Filed Weights   08/28/23 1256  Weight: 71.7 kg (158 lb)   BMI 24   Lab Results  Component Value Date   CREATININE 1.61 (H) 08/30/2023   CREATININE 2.36 (H) 08/29/2023   CREATININE 2.20 (H) 08/28/2023   Estimated Creatinine Clearance: 36.5 mL/min (A) (by C-G formula based on SCr of 1.61 mg/dL (H)). Hemoglobin & Hematocrit     Component Value Date/Time   HGB 10.2 (L) 08/30/2023 0820   HGB 13.7 11/16/2013 2040   HCT 27.7 (L) 08/30/2023 0820   HCT 40.9 11/16/2013 2040     Per Protocol for Patient with estCrcl now > 30 ml/min and BMI < 30, will transition to Lovenox  40 mg Q24h      Allean Haas PharmD Clinical Pharmacist 08/30/2023

## 2023-08-30 NOTE — Plan of Care (Signed)

## 2023-08-30 NOTE — Progress Notes (Signed)
 PROGRESS NOTE    Tricia [name]  FMW:[id number] DOB: June 13, 1960 DOA: 08/28/2023 PCP: Sowles, Krichna, MD  Chief Complaint  Patient presents with   [facility] Course:  Tricia [name] is a 63 year old female with chronic pain, COPD, asthma, osteoarthritis, anxiety, depression, type 2 diabetes, diastolic CHF, GERD, hypertension, dyslipidemia, who presents to the ED with syncopal episode.  Patient cannot elaborate to me on what occurred prior to her admission.  She remembers sitting down at home and then woke up in the hospital. Initial vitals in the ED are 125/100, creatinine elevation of 2.55, CK6 59.  Noncon head CT without acute intracranial abnormalities.  Patient received 2 L NS and was admitted for observation.  Subjective: Patient is much more alert today.  Orthostatics positive this morning.  Still dizzy when standing.  Objective: Vitals:   08/30/23 0757 08/30/23 1024 08/30/23 1529 08/30/23 1639  BP: 102/60 117/60 (!) 95/49 105/63  Pulse: 93 96 98   Resp: 16  16   Temp: 98 F (36.7 C)  98.3 F (36.8 C)   TempSrc: Oral  Oral   SpO2: 100%  96%   Weight:      Height:        Intake/Output Summary (Last 24 hours) at 08/30/2023 1705 Last data filed at 08/30/2023 1509 Gross per 24 hour  Intake 2267.33 ml  Output --  Net 2267.33 ml   Filed Weights   08/28/23 1256  Weight: 71.7 kg    Examination: General exam: Appears calm and comfortable, NAD  Respiratory system: No work of breathing, symmetric chest wall expansion Cardiovascular system: S1 & S2 heard, RRR.  Gastrointestinal system: Abdomen is nondistended, soft and nontender.  Neuro: Alert and oriented.  Speech is slurred and sometimes incoherent.  Is able to answer all questions and follow commands Extremities: Symmetric, expected ROM Skin: No rashes, lesions Psychiatry: Demonstrates appropriate judgement and insight. Mood & affect appropriate for situation.   Assessment & Plan:  Principal  Problem:   Acute kidney injury superimposed on chronic kidney disease (HCC) Active Problems:   Syncope, vasovagal   Rhabdomyolysis   Dyslipidemia   GERD without esophagitis   Parkinson disease (HCC)   Anxiety and depression   Chronic obstructive pulmonary disease (COPD) (HCC)   Type 2 diabetes mellitus with stage 3 chronic kidney disease (HCC)   Syncope - DDx: Orthostatic hypotension, arrhythmia, polypharmacy - Orthostatics positive. - Echocardiogram with preserved EF, grade 1 diastolic dysfunction.  Clinically euvolemic. - Rehydrated with 2 L NS - Continue to monitor on telemetry.  No arrhythmias noted thus far - PT/OT eval  Polypharmacy Chronic pain Opioid dependence - Patient is on multiple psychiatric medications, duplicate antihistamine therapy, as well as chronic opioid therapy - Home meds include: BuSpar , Cymbalta , Flexeril , Xyzal , gabapentin , morphine , MS Contin , Seroquel , hydroxyzine . - For now we will discontinue hydroxyzine , decrease gabapentin  which is currently at 1200 mg TID - Discontinue immediate release morphine  for now.  Continue with oxycodone .  Had extensive discussion with the patient's son-in-law who endorses similar concerns about her polypharmacy. - This opioid therapy is also worsening her orthostasis. - Patient reports she occasionally lives with a friend.  I have significant concerns about her ability to manage all of these home meds appropriately and suspect some may be taken incorrectly. -Reviewed PDMP. - Have discussed with patient that we will need to deprescribed some of these medications.  Will need close outpatient follow-up with her pain management clinic.   AKI - Baseline creatinine  less than 1 - Creatinine on arrival 2.5 - Status post IV fluids - Creatinine resolving.  Encourage p.o. intake. - Avoid nephrotoxic medications. Hold jardiance . Reduce gabapentin .  - Renally dosed the creatinine clearance of 24  Hypokalemia - Continue to  replace - Trend daily  Parkinson's disease - Continue home dose carbidopa  levodopa .  Anxiety Depression - On multiple psychiatric meds.  See above. - Deprescribed where able.  See above.  Type 2 diabetes - Most recent hemoglobin A1c 6.0%.  Well-controlled. - At home on Jardiance   Rhabdomyolysis - Status post IV fluids - Discontinue CK trend  GERD - Continue PPI  Dyslipidemia - On statin.  Hold given current rhabdo  COPD, not currently in exacerbation - Continue home meds   DVT prophylaxis: Lovenox    Code Status: Full Code Disposition: Admitted inpatient.  Pending resolution in AKI and safe dispo planning.  PT eval's  Consultants:    Procedures:    Antimicrobials:  Anti-infectives (From admission, onward)    None       Data Reviewed: I have personally reviewed following labs and imaging studies CBC: Recent Labs  Lab 08/28/23 1259 08/28/23 2159 08/29/23 0225 08/30/23 0820  WBC 9.8 7.7 7.6 8.2  NEUTROABS  --   --   --  6.8  HGB 10.8* 9.3* 9.7* 10.2*  HCT 30.5* 27.6* 28.0* 27.7*  MCV 89.4 91.7 89.5 87.7  PLT 231 190 190 201   Basic Metabolic Panel: Recent Labs  Lab 08/28/23 1259 08/28/23 2159 08/29/23 0225 08/30/23 0820  NA 132*  --  131* 134*  K 3.0*  --  3.1* 2.9*  CL 96*  --  99 105  CO2 24  --  21* 20*  GLUCOSE 92  --  101* 105*  BUN 31*  --  30* 24*  CREATININE 2.55* 2.20* 2.36* 1.61*  CALCIUM  7.4*  --  7.1* 7.4*   GFR: Estimated Creatinine Clearance: 36.5 mL/min (A) (by C-G formula based on SCr of 1.61 mg/dL (H)). Liver Function Tests: Recent Labs  Lab 08/30/23 0820  AST 54*  ALT <5  ALKPHOS 134*  BILITOT 2.3*  PROT 4.4*  ALBUMIN 1.9*   CBG: No results for input(s): GLUCAP in the last 168 hours.  No results found for this or any previous visit (from the past 240 hours).   Radiology Studies: ECHOCARDIOGRAM COMPLETE Result Date: 08/30/2023    ECHOCARDIOGRAM REPORT   Patient Name:   Tricia [name] Date of Exam:  08/30/2023 Medical Rec #:  [id number]              Height:       68.0 in Accession #:    [id number]             Weight:       158.0 lb Date of Birth:  [birth year]              BSA:          1.849 m Patient Age:    62 years               BP:           102/60 mmHg Patient Gender: F                      HR:           92 bpm. Exam Location:  [facility] Procedure: 2D Echo, Color Doppler and Cardiac Doppler (Both Spectral and Color  Flow Doppler were utilized during procedure). Indications:     Syncope R55  History:         Patient has prior history of Echocardiogram examinations, most                  recent 10/20/2014. COPD; Risk Factors:Hypertension.  Sonographer:     Christopher Furnace Referring Phys:  [id number] JAN A MANSY Diagnosing Phys: Redell Cave MD  Sonographer Comments: No parasternal window and no subcostal window. Image acquisition challenging due to COPD. IMPRESSIONS  1. Left ventricular ejection fraction, by estimation, is 60 to 65%. The left ventricle has normal function. The left ventricle has no regional wall motion abnormalities. Left ventricular diastolic parameters are consistent with Grade I diastolic dysfunction (impaired relaxation).  2. Right ventricular systolic function is normal. The right ventricular size is normal.  3. The mitral valve is normal in structure. Mild mitral valve regurgitation.  4. The aortic valve was not well visualized. Aortic valve regurgitation is not visualized. FINDINGS  Left Ventricle: Left ventricular ejection fraction, by estimation, is 60 to 65%. The left ventricle has normal function. The left ventricle has no regional wall motion abnormalities. The left ventricular internal cavity size was normal in size. There is  no left ventricular hypertrophy. Left ventricular diastolic parameters are consistent with Grade I diastolic dysfunction (impaired relaxation). Right Ventricle: The right ventricular size is normal. No increase in right ventricular wall thickness. Right  ventricular systolic function is normal. Left Atrium: Left atrial size was normal in size. Right Atrium: Right atrial size was normal in size. Pericardium: There is no evidence of pericardial effusion. Mitral Valve: The mitral valve is normal in structure. Mild mitral valve regurgitation. MV peak gradient, 10.0 mmHg. The mean mitral valve gradient is 4.0 mmHg. Tricuspid Valve: The tricuspid valve is normal in structure. Tricuspid valve regurgitation is not demonstrated. Aortic Valve: The aortic valve was not well visualized. Aortic valve regurgitation is not visualized. Aortic valve mean gradient measures 3.0 mmHg. Aortic valve peak gradient measures 4.8 mmHg. Aortic valve area, by VTI measures 2.46 cm. Pulmonic Valve: The pulmonic valve was not well visualized. Pulmonic valve regurgitation is not visualized. Aorta: The aortic root was not well visualized. Venous: The inferior vena cava was not well visualized. IAS/Shunts: No atrial level shunt detected by color flow Doppler.  LEFT VENTRICLE PLAX 2D LVIDd:         3.40 cm   Diastology LVIDs:         2.30 cm   LV e' medial:    6.09 cm/s LV PW:         1.10 cm   LV E/e' medial:  12.3 LV IVS:        0.80 cm   LV e' lateral:   9.25 cm/s LVOT diam:     2.00 cm   LV E/e' lateral: 8.1 LV SV:         46 LV SV Index:   25 LVOT Area:     3.14 cm  RIGHT VENTRICLE RV Basal diam:  3.40 cm RV Mid diam:    2.70 cm LEFT ATRIUM             Index        RIGHT ATRIUM          Index LA diam:        3.70 cm 2.00 cm/m   RA Area:     8.52 cm LA Vol (A2C):   20.5 ml 11.09 ml/m  RA Volume:   16.70 ml 9.03 ml/m LA Vol (A4C):   21.2 ml 11.47 ml/m LA Biplane Vol: 21.4 ml 11.57 ml/m  AORTIC VALVE AV Area (Vmax):    2.35 cm AV Area (Vmean):   2.23 cm AV Area (VTI):     2.46 cm AV Vmax:           110.00 cm/s AV Vmean:          79.300 cm/s AV VTI:            0.188 m AV Peak Grad:      4.8 mmHg AV Mean Grad:      3.0 mmHg LVOT Vmax:         82.20 cm/s LVOT Vmean:        56.400 cm/s LVOT  VTI:          0.147 m LVOT/AV VTI ratio: 0.78 MITRAL VALVE                TRICUSPID VALVE MV Area (PHT): 5.23 cm     TR Peak grad:   14.6 mmHg MV Area VTI:   1.43 cm     TR Vmax:        191.00 cm/s MV Peak grad:  10.0 mmHg MV Mean grad:  4.0 mmHg     SHUNTS MV Vmax:       1.58 m/s     Systemic VTI:  0.15 m MV Vmean:      95.6 cm/s    Systemic Diam: 2.00 cm MV Decel Time: 145 msec MV E velocity: 75.00 cm/s MV A velocity: 117.00 cm/s MV E/A ratio:  0.64 Redell Cave MD Electronically signed by Redell Cave MD Signature Date/Time: 08/30/2023/9:43:39 AM    Final    CT Head Wo Contrast Result Date: 08/28/2023 CLINICAL DATA:  Unwitnessed fall EXAM: CT HEAD WITHOUT CONTRAST TECHNIQUE: Contiguous axial images were obtained from the base of the skull through the vertex without intravenous contrast. RADIATION DOSE REDUCTION: This exam was performed according to the departmental dose-optimization program which includes automated exposure control, adjustment of the mA and/or kV according to patient size and/or use of iterative reconstruction technique. COMPARISON:  MRI 10/20/2013 FINDINGS: Brain: No acute territorial infarction, hemorrhage or intracranial mass. Mild atrophy. Nonenlarged ventricles Vascular: No hyperdense vessels.  Carotid vascular calcification Skull: Normal. Negative for fracture or focal lesion. Sinuses/Orbits: No acute finding. Other: None IMPRESSION: No CT evidence for acute intracranial abnormality. Mild atrophy. Electronically Signed   By: Luke Bun M.D.   On: 08/28/2023 17:53    Scheduled Meds:  busPIRone   7.5 mg Oral BID   carbidopa -levodopa   1 tablet Oral QHS   carbidopa -levodopa   1.5 tablet Oral TID   colchicine   0.6 mg Oral Daily   cyclobenzaprine   10 mg Oral QHS   DULoxetine   60 mg Oral Daily   enoxaparin  (LOVENOX ) injection  40 mg Subcutaneous Q24H   fluticasone   2 spray Each Nare Daily   gabapentin   400 mg Oral TID   ipratropium  2 spray Each Nare QID   montelukast    10 mg Oral QHS   oxyCODONE   15 mg Oral Q12H   pantoprazole   40 mg Oral Daily   potassium chloride   40 mEq Oral Q4H   QUEtiapine   25 mg Oral QHS   theophylline   400 mg Oral Daily   umeclidinium bromide   1 puff Inhalation Daily   Continuous Infusions:     LOS: 1 day  MDM: Patient is high risk  for one or more organ failure.  They necessitate ongoing hospitalization for continued IV therapies and subsequent lab monitoring. Total time spent interpreting labs and vitals, reviewing the medical record, coordinating care amongst consultants and care team members, directly assessing and discussing care with the patient and/or family: 55 min  Tauren Delbuono, DO [facility]  To contact the attending physician between 7A-7P please use Epic Chat. To contact the covering physician during after hours 7P-7A, please review Amion.  08/30/2023, 5:05 PM   *This document has been created with the assistance of dictation software. Please excuse typographical errors. *

## 2023-08-30 NOTE — Progress Notes (Signed)
*  PRELIMINARY RESULTS* Echocardiogram 2D Echocardiogram has been performed.  Tricia [name] 08/30/2023, 8:03 AM

## 2023-08-31 DIAGNOSIS — N179 Acute kidney failure, unspecified: Secondary | ICD-10-CM | POA: Diagnosis not present

## 2023-08-31 DIAGNOSIS — M6282 Rhabdomyolysis: Secondary | ICD-10-CM | POA: Diagnosis not present

## 2023-08-31 DIAGNOSIS — N189 Chronic kidney disease, unspecified: Secondary | ICD-10-CM | POA: Diagnosis not present

## 2023-08-31 DIAGNOSIS — R55 Syncope and collapse: Secondary | ICD-10-CM | POA: Diagnosis not present

## 2023-08-31 LAB — CBC WITH DIFFERENTIAL/PLATELET
Abs Immature Granulocytes: 0.11 K/uL — ABNORMAL HIGH (ref 0.00–0.07)
Basophils Absolute: 0 K/uL (ref 0.0–0.1)
Basophils Relative: 0 %
Eosinophils Absolute: 0 K/uL (ref 0.0–0.5)
Eosinophils Relative: 0 %
HCT: 26.8 % — ABNORMAL LOW (ref 36.0–46.0)
Hemoglobin: 9.3 g/dL — ABNORMAL LOW (ref 12.0–15.0)
Immature Granulocytes: 1 %
Lymphocytes Relative: 11 %
Lymphs Abs: 1.1 K/uL (ref 0.7–4.0)
MCH: 30.5 pg (ref 26.0–34.0)
MCHC: 34.7 g/dL (ref 30.0–36.0)
MCV: 87.9 fL (ref 80.0–100.0)
Monocytes Absolute: 0.5 K/uL (ref 0.1–1.0)
Monocytes Relative: 5 %
Neutro Abs: 8.5 K/uL — ABNORMAL HIGH (ref 1.7–7.7)
Neutrophils Relative %: 83 %
Platelets: 243 K/uL (ref 150–400)
RBC: 3.05 MIL/uL — ABNORMAL LOW (ref 3.87–5.11)
RDW: 14.4 % (ref 11.5–15.5)
WBC: 10.3 K/uL (ref 4.0–10.5)
nRBC: 0 % (ref 0.0–0.2)

## 2023-08-31 LAB — COMPREHENSIVE METABOLIC PANEL WITH GFR
ALT: 7 U/L (ref 0–44)
AST: 58 U/L — ABNORMAL HIGH (ref 15–41)
Albumin: 2 g/dL — ABNORMAL LOW (ref 3.5–5.0)
Alkaline Phosphatase: 138 U/L — ABNORMAL HIGH (ref 38–126)
Anion gap: 8 (ref 5–15)
BUN: 20 mg/dL (ref 8–23)
CO2: 20 mmol/L — ABNORMAL LOW (ref 22–32)
Calcium: 7.6 mg/dL — ABNORMAL LOW (ref 8.9–10.3)
Chloride: 104 mmol/L (ref 98–111)
Creatinine, Ser: 1.55 mg/dL — ABNORMAL HIGH (ref 0.44–1.00)
GFR, Estimated: 38 mL/min — ABNORMAL LOW (ref >60)
Glucose, Bld: 134 mg/dL — ABNORMAL HIGH (ref 70–99)
Potassium: 4.2 mmol/L (ref 3.5–5.1)
Sodium: 132 mmol/L — ABNORMAL LOW (ref 135–145)
Total Bilirubin: 2.1 mg/dL — ABNORMAL HIGH (ref 0.0–1.2)
Total Protein: 4.6 g/dL — ABNORMAL LOW (ref 6.5–8.1)

## 2023-08-31 LAB — URINALYSIS, ROUTINE W REFLEX MICROSCOPIC
Bilirubin Urine: NEGATIVE
Glucose, UA: NEGATIVE mg/dL
Ketones, ur: 5 mg/dL — AB
Nitrite: NEGATIVE
Protein, ur: 100 mg/dL — AB
Specific Gravity, Urine: 1.014 (ref 1.005–1.030)
WBC, UA: >50 WBC/hpf (ref 0–5)
pH: 6 (ref 5.0–8.0)

## 2023-08-31 MED ORDER — HALOPERIDOL LACTATE 5 MG/ML IJ SOLN
1.0000 mg | Freq: Four times a day (QID) | INTRAMUSCULAR | Status: DC | PRN
  Administered 2023-08-31 – 2023-09-12 (×2): 2 mg via INTRAMUSCULAR
  Filled 2023-08-31 (×2): qty 1

## 2023-08-31 MED ORDER — MUPIROCIN 2 % EX OINT
1.0000 | TOPICAL_OINTMENT | Freq: Two times a day (BID) | CUTANEOUS | Status: AC
  Administered 2023-08-31 – 2023-09-04 (×8): 1 via NASAL
  Filled 2023-08-31: qty 22

## 2023-08-31 MED ORDER — CHLORHEXIDINE GLUCONATE CLOTH 2 % EX PADS
6.0000 | MEDICATED_PAD | Freq: Every day | CUTANEOUS | Status: DC
Start: 2023-08-31 — End: 2023-09-05
  Administered 2023-08-31 – 2023-09-01 (×2): 6 via TOPICAL

## 2023-08-31 MED ORDER — SODIUM CHLORIDE 0.9 % IV SOLN
1.0000 g | INTRAVENOUS | Status: DC
  Administered 2023-08-31 – 2023-09-02 (×3): 1 g via INTRAVENOUS
  Filled 2023-08-31 (×3): qty 10

## 2023-08-31 NOTE — Evaluation (Addendum)
 Occupational Therapy Evaluation Patient Details Name: Tricia [name] MRN: [id number] DOB: 04/09/1960 Today's Date: 08/31/2023   History of Present Illness   Pt. is a 63 y.o. female who was admitted to [facility] with an acute kidney injury superimposed on Chronic Kidney Disease.  PMHx includes: COPD, Asthma, osteoarthritis, anxiety, Depression, TypeII DM, Diastolic Dysfunction, GERD, HTN, Dyslipidemia, Rhabdomyolitis 2/2 fall, Parkinson's Disease, and syncope.     Clinical Impressions Pt. Presents with 9/10 stomach pain, weakness, limited activity tolerance, limited cognition, and limited functional mobility which hinders her ability to complete basic ADL and IADL functioning, including light meal preparation. Pt. presents with impulsivity, and attempts to get up unassisted, however with cues is easily redirected. Pt. reports residing at home alone, as the person she was living with has recently deceased. Pt. Also reports independence with ADLs, and IADL functioning. Pt. Reports that she was not receiving any assist with daily care tasks, however has help if needed. Pt. Requires MinA for sit to stand from the commode with cues for hand placement, CGA for ambulating from the bathroom to the the bed with the RW, CGA standing at the sinkside for hand hygiene. MinA LE ADLs. Pt. Will benefit from OT services for ADL training, UE there. Ex., and Pt./caregiver education about cognitive compensatory strategies, home modification, and DME to maximize independence with ADLs, and IADL tasks.      If plan is discharge home, recommend the following:         Functional Status Assessment   Patient has had a recent decline in their functional status and demonstrates the ability to make significant improvements in function in a reasonable and predictable amount of time.     Equipment Recommendations         Recommendations for Other Services    PT     Precautions/Restrictions    Precautions Precautions: Fall Restrictions Weight Bearing Restrictions Per Provider Order: No     Mobility Bed Mobility Overal bed mobility: Independent                  Transfers Overall transfer level: Needs assistance   Transfers: Sit to/from Stand Sit to Stand: Min assist (Mod verbal cues)                  Balance Overall balance assessment: Needs assistance   Sitting balance-Leahy Scale: Good       Standing balance-Leahy Scale: Fair (with RW)                             ADL either performed or assessed with clinical judgement   ADL Overall ADL's : Needs assistance/impaired Eating/Feeding: Independent;Set up   Grooming: Contact guard assist;Standing;Set up           Upper Body Dressing : Set up;Supervision/safety   Lower Body Dressing: Minimal assistance;Set up     Toilet Transfer Details (indicate cue type and reason): MinA sit to stand with mod cues for hand placement; CGA ambulating with the RW from the bathroom toilet to the bed. Toileting- Clothing Manipulation and Hygiene: Supervision/safety;Set up       Functional mobility during ADLs: Contact guard assist;Rolling walker (2 wheels)       Vision Baseline Vision/History:  (Reports no changes in vision)       Perception         Praxis         Pertinent Vitals/Pain Pain Assessment Pain Assessment: 0-10 (Positive for  stomach pain-NR) Pain Score: 9  Pain Location: Stomach pain Pain Descriptors / Indicators: Aching Pain Intervention(s): Monitored during session     Extremity/Trunk Assessment Upper Extremity Assessment Upper Extremity Assessment: Generalized weakness           Communication Communication Communication: No apparent difficulties   Cognition Arousal: Alert Behavior During Therapy: WFL for tasks assessed/performed Cognition: No family/caregiver present to determine baseline                               Following commands:  Intact       Cueing  General Comments   Cueing Techniques: Verbal cues      Exercises     Shoulder Instructions      Home Living Family/patient expects to be discharged to:: Private residence Living Arrangements: Alone (Pt. reports the person she was living with recently passed away.) Available Help at Discharge: Available PRN/intermittently Type of Home: House Home Access: Stairs to enter     Home Layout: One level     Bathroom Shower/Tub: Walk-in shower         Home Equipment: Agricultural consultant (2 wheels)          Prior Functioning/Environment Prior Level of Function : History of Falls (last six months)               ADLs Comments: Pt. reports independence with ADLs, and IADLs    OT Problem List: Decreased strength;Decreased cognition;Impaired balance (sitting and/or standing);Decreased activity tolerance;Pain   OT Treatment/Interventions: Self-care/ADL training;Therapeutic exercise;DME and/or AE instruction;Patient/family education;Therapeutic activities;Cognitive remediation/compensation;Energy conservation      OT Goals(Current goals can be found in the care plan section)   Acute Rehab OT Goals Patient Stated Goal: To feel better OT Goal Formulation: With patient Time For Goal Achievement: 09/14/23 Potential to Achieve Goals: Good   OT Frequency:  Min 2X/week    Co-evaluation              AM-PAC OT 6 Clicks Daily Activity     Outcome Measure Help from another person eating meals?: None Help from another person taking care of personal grooming?: A Little Help from another person toileting, which includes using toliet, bedpan, or urinal?: A Little Help from another person bathing (including washing, rinsing, drying)?: A Little Help from another person to put on and taking off regular upper body clothing?: A Little Help from another person to put on and taking off regular lower body clothing?: A Little 6 Click Score: 19   End of  Session Equipment Utilized During Treatment: Gait belt  Activity Tolerance: Patient tolerated treatment well Patient left: in bed;with call bell/phone within reach;with bed alarm set  OT Visit Diagnosis: Unsteadiness on feet (R26.81);Muscle weakness (generalized) (M62.81);History of falling (Z91.81)                Time: 1040-1055 OT Time Calculation (min): 15 min Charges:  OT General Charges $OT Visit: 1 Visit OT Evaluation $OT Eval Moderate Complexity: 1 Mod  Baruch Otter, MS, OTR/L   Fristoe Otter 08/31/2023, 11:29 AM

## 2023-08-31 NOTE — Progress Notes (Signed)
 Care was taken over by Mliss, RN. This Nurse was not made aware.

## 2023-08-31 NOTE — Plan of Care (Signed)

## 2023-08-31 NOTE — Evaluation (Signed)
 Physical Therapy Evaluation Patient Details Name: Tricia [name] MRN: [id number] DOB: 03-May-1960 Today's Date: 08/31/2023  History of Present Illness  63 y.o. female admitted with acute kidney injury superimposed on Chronic Kidney Disease.  PMHx includes: COPD, Asthma, osteoarthritis, anxiety, Depression, TypeII DM, Diastolic Dysfunction, GERD, HTN, Dyslipidemia, Rhabdomyolitis 2/2 fall, Parkinson's Disease, and syncope.  Clinical Impression  Pt showed good effort with PT session, she did need some light assist to get to sitting and close CGA with rising to standing.  Pt reports she has had >20 falls this year but apart from some initial posterior lean on bed to stand she generally showed good steadiness with in-room ambulation with the walker.  Pt will benefit from continued PT to address functional limitations.        If plan is discharge home, recommend the following: Assistance with cooking/housework;Assist for transportation;Help with stairs or ramp for entrance   Can travel by private vehicle        Equipment Recommendations None recommended by PT (has walker)  Recommendations for Other Services       Functional Status Assessment Patient has had a recent decline in their functional status and demonstrates the ability to make significant improvements in function in a reasonable and predictable amount of time.     Precautions / Restrictions Precautions Precautions: Fall Restrictions Weight Bearing Restrictions Per Provider Order: No      Mobility  Bed Mobility Overal bed mobility: Needs Assistance Bed Mobility: Supine to Sit     Supine to sit: Min assist     General bed mobility comments: Pt showed good effort in getting to sidelying and inititaing getting up to seated, but despite multiple attempts could not do so with just he bed rail. Ultimately gave her HHA to pull up from with minA to attain upright    Transfers Overall transfer level: Needs assistance    Transfers: Sit to/from Stand Sit to Stand: Min assist, Contact guard assist           General transfer comment: Initially leaning back of legs heavily on bed to leverage up, only needing very light hands on coing to insure keeping weight forward; once overtop of walker able to maintain balance w/o lean    Ambulation/Gait Ambulation/Gait assistance: Contact guard assist Gait Distance (Feet): 40 Feet Assistive device: Rolling walker (2 wheels)         General Gait Details: Pt was able to ambulate to/from the door with FWW and relatively confident cadence.  No LOBs but did fatigue with the effort - SOB with SpO2 down to 88% with the effort (on room air)  Stairs            Wheelchair Mobility     Tilt Bed    Modified Rankin (Stroke Patients Only)       Balance Overall balance assessment: Needs assistance   Sitting balance-Leahy Scale: Good     Standing balance support: Bilateral upper extremity supported Standing balance-Leahy Scale: Fair Standing balance comment: reliant on walker when up, no LOBs but showing some posterior bias t/o                             Pertinent Vitals/Pain Pain Assessment Pain Assessment: Faces Faces Pain Scale: Hurts little more Pain Location: Stomach pain    Home Living Family/patient expects to be discharged to:: Private residence Living Arrangements: Other relatives (son-in-law) Available Help at Discharge: Available PRN/intermittently (S-in-L at work weekdays)  Type of Home: House Home Access: Stairs to enter Entrance Stairs-Rails: None (apparently S-in-L is going to put up some rails soon) Entrance Stairs-Number of Steps: 2   Home Layout: One level Home Equipment: Agricultural consultant (2 wheels)      Prior Function Prior Level of Function : History of Falls (last six months)             Mobility Comments: Pt reports she has had 20-25 falls this year.  Also report she is still driving, out of the home almost  daily and relatively active ADLs Comments: Pt. reports independence with ADLs, and IADLs     Extremity/Trunk Assessment   Upper Extremity Assessment Upper Extremity Assessment: Generalized weakness    Lower Extremity Assessment Lower Extremity Assessment: Generalized weakness       Communication   Communication Communication: No apparent difficulties    Cognition Arousal: Alert Behavior During Therapy: WFL for tasks assessed/performed                             Following commands: Intact       Cueing Cueing Techniques: Verbal cues     General Comments General comments (skin integrity, edema, etc.): Pt reports she feels very confident about being able to manage at home alone during the day    Exercises     Assessment/Plan    PT Assessment Patient needs continued PT services  PT Problem List Decreased strength;Decreased balance;Decreased activity tolerance;Decreased mobility;Decreased safety awareness;Decreased knowledge of precautions;Pain       PT Treatment Interventions DME instruction;Gait training;Stair training;Functional mobility training;Therapeutic activities;Therapeutic exercise;Balance training;Patient/family education    PT Goals (Current goals can be found in the Care Plan section)  Acute Rehab PT Goals Patient Stated Goal: go home PT Goal Formulation: With patient Time For Goal Achievement: 09/13/23 Potential to Achieve Goals: Fair    Frequency Min 2X/week     Co-evaluation               AM-PAC PT 6 Clicks Mobility  Outcome Measure Help needed turning from your back to your side while in a flat bed without using bedrails?: None Help needed moving from lying on your back to sitting on the side of a flat bed without using bedrails?: A Little Help needed moving to and from a bed to a chair (including a wheelchair)?: None Help needed standing up from a chair using your arms (e.g., wheelchair or bedside chair)?: A  Little Help needed to walk in hospital room?: A Little Help needed climbing 3-5 steps with a railing? : A Little 6 Click Score: 20    End of Session Equipment Utilized During Treatment: Gait belt Activity Tolerance: Patient tolerated treatment well Patient left: with chair alarm set;with call bell/phone within reach Nurse Communication: Mobility status PT Visit Diagnosis: Muscle weakness (generalized) (M62.81);Difficulty in walking, not elsewhere classified (R26.2)    Time: 8899-8875 PT Time Calculation (min) (ACUTE ONLY): 24 min   Charges:   PT Evaluation $PT Eval Low Complexity: 1 Low PT Treatments $Gait Training: 8-22 mins PT General Charges $$ ACUTE PT VISIT: 1 Visit         Carmin JONELLE Deed, DPT 08/31/2023, 1:39 PM

## 2023-08-31 NOTE — Progress Notes (Signed)
 PROGRESS NOTE    RAJAH LAMBA  FMW:[id number] DOB: 1960/07/22 DOA: 08/28/2023 PCP: Sowles, Krichna, MD  Chief Complaint  Patient presents with   [facility] Course:  Tricia [name] is a 63 year old female with chronic pain, COPD, asthma, osteoarthritis, anxiety, depression, type 2 diabetes, diastolic CHF, GERD, hypertension, dyslipidemia, who presents to the ED with syncopal episode.  Patient cannot elaborate to me on what occurred prior to her admission.  She remembers sitting down at home and then woke up in the hospital. Initial vitals in the ED are 125/100, creatinine elevation of 2.55, CK6 59.  Noncon head CT without acute intracranial abnormalities.  Patient received 2 L NS and was admitted for observation.  Subjective: Issues with urinary retention overnight.  Straight cath x 3. have ordered for Foley placement this morning.  Today patient is still very somnolent, has had intermittent episodes of agitation, getting out of bed.  She is currently sitting at the nurses station for closer supervision. Evaluation she denies any pain.  She is amendable to continued de-escalation of her medications.  Objective: Vitals:   08/31/23 0505 08/31/23 0800 08/31/23 0931 08/31/23 0938  BP: 106/87 (!) 109/54 115/62 (!) 121/55  Pulse: 94 96 93 97  Resp: 18 16 17    Temp: 98.2 F (36.8 C) 98 F (36.7 C)    TempSrc:      SpO2: 97% 100% 96%   Weight:      Height:        Intake/Output Summary (Last 24 hours) at 08/31/2023 1355 Last data filed at 08/31/2023 9057 Gross per 24 hour  Intake 240 ml  Output 1075 ml  Net -835 ml   Filed Weights   08/28/23 1256  Weight: 71.7 kg    Examination: General exam: Appears calm and comfortable, NAD  Respiratory system: No work of breathing, symmetric chest wall expansion Cardiovascular system: S1 & S2 heard, RRR.  Gastrointestinal system: Abdomen is nondistended, soft and nontender.  Neuro: Alert and oriented.  Speech is slurred  and sometimes incoherent.  Is able to answer all questions and follow commands Extremities: Symmetric, expected ROM Skin: No rashes, lesions Psychiatry: Demonstrates appropriate judgement and insight. Mood & affect appropriate for situation.   Assessment & Plan:  Principal Problem:   Acute kidney injury superimposed on chronic kidney disease (HCC) Active Problems:   Syncope, vasovagal   Rhabdomyolysis   Dyslipidemia   GERD without esophagitis   Parkinson disease (HCC)   Anxiety and depression   Chronic obstructive pulmonary disease (COPD) (HCC)   Type 2 diabetes mellitus with stage 3 chronic kidney disease (HCC)   Syncope - DDx: Orthostatic hypotension, arrhythmia, polypharmacy - Orthostatics positive. - Echocardiogram with preserved EF, grade 1 diastolic dysfunction.  Clinically euvolemic. - Rehydrated with 2 L NS - Continue to monitor on telemetry.  No arrhythmias noted thus far - PT/OT eval, currently recommending home health.  Patient is not yet independent enough to discharge home - Have discussed these recommendations with her son, he believes that she would benefit from rehab.  Polypharmacy Chronic pain Opioid dependence - Patient is on multiple psychiatric medications, duplicate antihistamine therapy, as well as chronic opioid therapy - Home meds include: BuSpar , Cymbalta , Flexeril , Xyzal , gabapentin , morphine , MS Contin , Seroquel , hydroxyzine . - Continue with daily medication de-escalation.  Have discontinued hydroxyzine , decrease gabapentin , discontinued immediate release morphine . - Will continue with home dose oxycodone  for now to avoid withdrawal - Have discussed polypharmacy extensively with the patient as well as with  her son-in-law who endorsed understanding and agree that they have similar concerns - Polypharmacy is also contributing to her orthostasis and syncope - PT/OT currently recommending home health.  I have significant concerns about her ability to manage  her health needs at home  AKI Hypokalemia - Baseline creatinine less than 1 - Creatinine on arrival 2.5 - Status post IV fluids.  Creatinine continues to improve now - Continue electrolyte replacement as needed.  Trend daily CMP - Avoid nephrotoxic medications - Hold Jardiance  - Reduce gabapentin  as above.  Avoid hypotension as above.  Suspected Malnutrition - Albumin 2.0 - Will consult RD  Parkinson's disease - Continue home dose carbidopa  levodopa .  Anxiety Depression - On multiple psychiatric meds.  See above. - Continue to Deprescribe where able.  See above.  Type 2 diabetes - Most recent hemoglobin A1c 6.0%.  Well-controlled. - At home on Jardiance   Rhabdomyolysis - Status post IV fluids - Discontinue CK trend  GERD - Continue PPI  Dyslipidemia - On statin.  Hold given current rhabdo  COPD, not currently in exacerbation - Continue home meds   DVT prophylaxis: Lovenox    Code Status: Full Code Disposition: Admitted inpatient.  Pending resolution in AKI and safe dispo planning.  Home health at discharge  Consultants:    Procedures:    Antimicrobials:  Anti-infectives (From admission, onward)    None       Data Reviewed: I have personally reviewed following labs and imaging studies CBC: Recent Labs  Lab 08/28/23 1259 08/28/23 2159 08/29/23 0225 08/30/23 0820 08/31/23 0908  WBC 9.8 7.7 7.6 8.2 10.3  NEUTROABS  --   --   --  6.8 8.5*  HGB 10.8* 9.3* 9.7* 10.2* 9.3*  HCT 30.5* 27.6* 28.0* 27.7* 26.8*  MCV 89.4 91.7 89.5 87.7 87.9  PLT 231 190 190 201 243   Basic Metabolic Panel: Recent Labs  Lab 08/28/23 1259 08/28/23 2159 08/29/23 0225 08/30/23 0820 08/31/23 0908  NA 132*  --  131* 134* 132*  K 3.0*  --  3.1* 2.9* 4.2  CL 96*  --  99 105 104  CO2 24  --  21* 20* 20*  GLUCOSE 92  --  101* 105* 134*  BUN 31*  --  30* 24* 20  CREATININE 2.55* 2.20* 2.36* 1.61* 1.55*  CALCIUM  7.4*  --  7.1* 7.4* 7.6*   GFR: Estimated Creatinine  Clearance: 38 mL/min (A) (by C-G formula based on SCr of 1.55 mg/dL (H)). Liver Function Tests: Recent Labs  Lab 08/30/23 0820 08/31/23 0908  AST 54* 58*  ALT <5 7  ALKPHOS 134* 138*  BILITOT 2.3* 2.1*  PROT 4.4* 4.6*  ALBUMIN 1.9* 2.0*   CBG: No results for input(s): GLUCAP in the last 168 hours.  Recent Results (from the past 240 hours)  MRSA Next Gen by PCR, Nasal     Status: Abnormal   Collection Time: 08/29/23  5:20 PM   Specimen: Nasal Mucosa; Nasal Swab  Result Value Ref Range Status   MRSA by PCR Next Gen DETECTED (A) NOT DETECTED Final    Comment: RESULT CALLED TO, READ BACK BY AND VERIFIED WITH: BYNUM, JOYYA D., LPN @2044  08/30/2023 COP (NOTE) The GeneXpert MRSA Assay (FDA approved for NASAL specimens only), is one component of a comprehensive MRSA colonization surveillance program. It is not intended to diagnose MRSA infection nor to guide or monitor treatment for MRSA infections. Test performance is not FDA approved in patients less than 32 years old. Performed at [facility], [address], [place], [place] [address]      Radiology Studies: ECHOCARDIOGRAM COMPLETE Result Date: 08/30/2023    ECHOCARDIOGRAM REPORT   Patient Name:   KOREE STAHELI Date of Exam: 08/30/2023 Medical Rec #:  [id number]              Height:       68.0 in Accession #:    [id number]             Weight:       158.0 lb Date of Birth:  Dec 31, 1960              BSA:          1.849 m Patient Age:    62 years               BP:           102/60 mmHg Patient Gender: F                      HR:           92 bpm. Exam Location:  [facility] Procedure: 2D Echo, Color Doppler and Cardiac Doppler (Both Spectral and Color            Flow Doppler were utilized during procedure). Indications:     Syncope R55  History:         Patient has prior history of Echocardiogram examinations, most                  recent 10/20/2014. COPD; Risk Factors:Hypertension.  Sonographer:     Christopher Furnace Referring Phys:   [id number] JAN A MANSY Diagnosing Phys: Redell Cave MD  Sonographer Comments: No parasternal window and no subcostal window. Image acquisition challenging due to COPD. IMPRESSIONS  1. Left ventricular ejection fraction, by estimation, is 60 to 65%. The left ventricle has normal function. The left ventricle has no regional wall motion abnormalities. Left ventricular diastolic parameters are consistent with Grade I diastolic dysfunction (impaired relaxation).  2. Right ventricular systolic function is normal. The right ventricular size is normal.  3. The mitral valve is normal in structure. Mild mitral valve regurgitation.  4. The aortic valve was not well visualized. Aortic valve regurgitation is not visualized. FINDINGS  Left Ventricle: Left ventricular ejection fraction, by estimation, is 60 to 65%. The left ventricle has normal function. The left ventricle has no regional wall motion abnormalities. The left ventricular internal cavity size was normal in size. There is  no left ventricular hypertrophy. Left ventricular diastolic parameters are consistent with Grade I diastolic dysfunction (impaired relaxation). Right Ventricle: The right ventricular size is normal. No increase in right ventricular wall thickness. Right ventricular systolic function is normal. Left Atrium: Left atrial size was normal in size. Right Atrium: Right atrial size was normal in size. Pericardium: There is no evidence of pericardial effusion. Mitral Valve: The mitral valve is normal in structure. Mild mitral valve regurgitation. MV peak gradient, 10.0 mmHg. The mean mitral valve gradient is 4.0 mmHg. Tricuspid Valve: The tricuspid valve is normal in structure. Tricuspid valve regurgitation is not demonstrated. Aortic Valve: The aortic valve was not well visualized. Aortic valve regurgitation is not visualized. Aortic valve mean gradient measures 3.0 mmHg. Aortic valve peak gradient measures 4.8 mmHg. Aortic valve area, by VTI measures  2.46 cm. Pulmonic Valve: The pulmonic valve was not well visualized. Pulmonic valve regurgitation is not visualized. Aorta: The aortic root was not  well visualized. Venous: The inferior vena cava was not well visualized. IAS/Shunts: No atrial level shunt detected by color flow Doppler.  LEFT VENTRICLE PLAX 2D LVIDd:         3.40 cm   Diastology LVIDs:         2.30 cm   LV e' medial:    6.09 cm/s LV PW:         1.10 cm   LV E/e' medial:  12.3 LV IVS:        0.80 cm   LV e' lateral:   9.25 cm/s LVOT diam:     2.00 cm   LV E/e' lateral: 8.1 LV SV:         46 LV SV Index:   25 LVOT Area:     3.14 cm  RIGHT VENTRICLE RV Basal diam:  3.40 cm RV Mid diam:    2.70 cm LEFT ATRIUM             Index        RIGHT ATRIUM          Index LA diam:        3.70 cm 2.00 cm/m   RA Area:     8.52 cm LA Vol (A2C):   20.5 ml 11.09 ml/m  RA Volume:   16.70 ml 9.03 ml/m LA Vol (A4C):   21.2 ml 11.47 ml/m LA Biplane Vol: 21.4 ml 11.57 ml/m  AORTIC VALVE AV Area (Vmax):    2.35 cm AV Area (Vmean):   2.23 cm AV Area (VTI):     2.46 cm AV Vmax:           110.00 cm/s AV Vmean:          79.300 cm/s AV VTI:            0.188 m AV Peak Grad:      4.8 mmHg AV Mean Grad:      3.0 mmHg LVOT Vmax:         82.20 cm/s LVOT Vmean:        56.400 cm/s LVOT VTI:          0.147 m LVOT/AV VTI ratio: 0.78 MITRAL VALVE                TRICUSPID VALVE MV Area (PHT): 5.23 cm     TR Peak grad:   14.6 mmHg MV Area VTI:   1.43 cm     TR Vmax:        191.00 cm/s MV Peak grad:  10.0 mmHg MV Mean grad:  4.0 mmHg     SHUNTS MV Vmax:       1.58 m/s     Systemic VTI:  0.15 m MV Vmean:      95.6 cm/s    Systemic Diam: 2.00 cm MV Decel Time: 145 msec MV E velocity: 75.00 cm/s MV A velocity: 117.00 cm/s MV E/A ratio:  0.64 Redell Cave MD Electronically signed by Redell Cave MD Signature Date/Time: 08/30/2023/9:43:39 AM    Final     Scheduled Meds:  busPIRone   7.5 mg Oral BID   carbidopa -levodopa   1 tablet Oral QHS   carbidopa -levodopa   1.5 tablet  Oral TID   Chlorhexidine  Gluconate Cloth  6 each Topical Daily   colchicine   0.6 mg Oral Daily   cyclobenzaprine   10 mg Oral QHS   DULoxetine   60 mg Oral Daily   enoxaparin  (LOVENOX ) injection  40 mg Subcutaneous Q24H   fluticasone   2 spray Each Nare Daily   gabapentin   400 mg Oral TID   ipratropium  2 spray Each Nare QID   montelukast   10 mg Oral QHS   mupirocin  ointment  1 Application Nasal BID   oxyCODONE   15 mg Oral Q12H   pantoprazole   40 mg Oral Daily   QUEtiapine   25 mg Oral QHS   theophylline   400 mg Oral Daily   umeclidinium bromide   1 puff Inhalation Daily   Continuous Infusions:     LOS: 2 days  MDM: Patient is high risk for one or more organ failure.  They necessitate ongoing hospitalization for continued IV therapies and subsequent lab monitoring. Total time spent interpreting labs and vitals, reviewing the medical record, coordinating care amongst consultants and care team members, directly assessing and discussing care with the patient and/or family: 55 min  Tasheema Perrone, DO [facility]  To contact the attending physician between 7A-7P please use Epic Chat. To contact the covering physician during after hours 7P-7A, please review Amion.  08/31/2023, 1:55 PM   *This document has been created with the assistance of dictation software. Please excuse typographical errors. *

## 2023-09-01 DIAGNOSIS — R55 Syncope and collapse: Secondary | ICD-10-CM | POA: Diagnosis not present

## 2023-09-01 DIAGNOSIS — M6282 Rhabdomyolysis: Secondary | ICD-10-CM | POA: Diagnosis not present

## 2023-09-01 DIAGNOSIS — N179 Acute kidney failure, unspecified: Secondary | ICD-10-CM | POA: Diagnosis not present

## 2023-09-01 DIAGNOSIS — N189 Chronic kidney disease, unspecified: Secondary | ICD-10-CM | POA: Diagnosis not present

## 2023-09-01 LAB — GASTROINTESTINAL PANEL BY PCR, STOOL (REPLACES STOOL CULTURE)

## 2023-09-01 LAB — COMPREHENSIVE METABOLIC PANEL WITH GFR
ALT: <5 U/L (ref 0–44)
AST: 52 U/L — ABNORMAL HIGH (ref 15–41)
Albumin: 2 g/dL — ABNORMAL LOW (ref 3.5–5.0)
Alkaline Phosphatase: 138 U/L — ABNORMAL HIGH (ref 38–126)
Anion gap: 8 (ref 5–15)
BUN: 18 mg/dL (ref 8–23)
CO2: 18 mmol/L — ABNORMAL LOW (ref 22–32)
Calcium: 7.4 mg/dL — ABNORMAL LOW (ref 8.9–10.3)
Chloride: 106 mmol/L (ref 98–111)
Creatinine, Ser: 1.26 mg/dL — ABNORMAL HIGH (ref 0.44–1.00)
GFR, Estimated: 48 mL/min — ABNORMAL LOW (ref >60)
Glucose, Bld: 115 mg/dL — ABNORMAL HIGH (ref 70–99)
Potassium: 3.8 mmol/L (ref 3.5–5.1)
Sodium: 132 mmol/L — ABNORMAL LOW (ref 135–145)
Total Bilirubin: 1.6 mg/dL — ABNORMAL HIGH (ref 0.0–1.2)
Total Protein: 4.9 g/dL — ABNORMAL LOW (ref 6.5–8.1)

## 2023-09-01 LAB — CBC WITH DIFFERENTIAL/PLATELET
Abs Immature Granulocytes: 0.13 K/uL — ABNORMAL HIGH (ref 0.00–0.07)
Basophils Absolute: 0 K/uL (ref 0.0–0.1)
Basophils Relative: 1 %
Eosinophils Absolute: 0 K/uL (ref 0.0–0.5)
Eosinophils Relative: 0 %
HCT: 25.6 % — ABNORMAL LOW (ref 36.0–46.0)
Hemoglobin: 8.9 g/dL — ABNORMAL LOW (ref 12.0–15.0)
Immature Granulocytes: 2 %
Lymphocytes Relative: 16 %
Lymphs Abs: 1.3 K/uL (ref 0.7–4.0)
MCH: 30.8 pg (ref 26.0–34.0)
MCHC: 34.8 g/dL (ref 30.0–36.0)
MCV: 88.6 fL (ref 80.0–100.0)
Monocytes Absolute: 0.4 K/uL (ref 0.1–1.0)
Monocytes Relative: 5 %
Neutro Abs: 6.3 K/uL (ref 1.7–7.7)
Neutrophils Relative %: 76 %
Platelets: 231 K/uL (ref 150–400)
RBC: 2.89 MIL/uL — ABNORMAL LOW (ref 3.87–5.11)
RDW: 14.4 % (ref 11.5–15.5)
WBC: 8.2 K/uL (ref 4.0–10.5)
nRBC: 0 % (ref 0.0–0.2)

## 2023-09-01 LAB — MAGNESIUM: Magnesium: 1.5 mg/dL — ABNORMAL LOW (ref 1.7–2.4)

## 2023-09-01 LAB — PHOSPHORUS: Phosphorus: 2.6 mg/dL (ref 2.5–4.6)

## 2023-09-01 MED ORDER — LOPERAMIDE HCL 2 MG PO CAPS
2.0000 mg | ORAL_CAPSULE | ORAL | Status: DC | PRN
  Administered 2023-09-01 – 2023-09-13 (×18): 2 mg via ORAL
  Filled 2023-09-01 (×19): qty 1

## 2023-09-01 MED ORDER — SALINE SPRAY 0.65 % NA SOLN
1.0000 | NASAL | Status: DC
  Administered 2023-09-01 – 2023-09-18 (×28): 1 via NASAL
  Filled 2023-09-01 (×2): qty 44

## 2023-09-01 MED ORDER — BANATROL TF EN LIQD
60.0000 mL | Freq: Two times a day (BID) | ENTERAL | Status: DC
  Administered 2023-09-01 – 2023-09-11 (×14): 60 mL via ORAL

## 2023-09-01 MED ORDER — MAGNESIUM SULFATE 4 GM/100ML IV SOLN
4.0000 g | Freq: Once | INTRAVENOUS | Status: AC
  Administered 2023-09-01: 4 g via INTRAVENOUS
  Filled 2023-09-01: qty 100

## 2023-09-01 MED ORDER — ENSURE PLUS HIGH PROTEIN PO LIQD
237.0000 mL | Freq: Two times a day (BID) | ORAL | Status: DC
  Administered 2023-09-01 – 2023-09-02 (×2): 237 mL via ORAL

## 2023-09-01 MED ORDER — ADULT MULTIVITAMIN W/MINERALS CH
1.0000 | ORAL_TABLET | Freq: Every day | ORAL | Status: DC
  Administered 2023-09-01 – 2023-09-18 (×18): 1 via ORAL
  Filled 2023-09-01 (×18): qty 1

## 2023-09-01 NOTE — Progress Notes (Signed)
 PROGRESS NOTE    [name]  FMW:[id number] DOB: 05-11-1960 DOA: 08/28/2023 PCP: Sowles, Krichna, MD  Chief Complaint  Patient presents with   [facility] Course:  Tricia [name] is a 63 year old female with chronic pain, COPD, asthma, osteoarthritis, anxiety, depression, type 2 diabetes, diastolic CHF, GERD, hypertension, dyslipidemia, who presents to the ED with syncopal episode.  Patient cannot elaborate to me on what occurred prior to her admission.  She remembers sitting down at home and then woke up in the hospital. Initial vitals in the ED are 125/100, creatinine elevation of 2.55, CK6 59.  Noncon head CT without acute intracranial abnormalities.  Patient received 2 L NS and was admitted for observation.  Subjective: This morning patient is endorsing some dryness in the back of her throat.  She otherwise has no complaints and reports that she is feeling stronger each day. Overnight MD initiated patient on ceftriaxone  for presumed UTI  Objective: Vitals:   08/31/23 1941 08/31/23 2226 09/01/23 0402 09/01/23 0747  BP: (!) 103/58 (!) 101/52 (!) 105/58 116/60  Pulse: (!) 113 97 90 98  Resp:  16 20 16   Temp:  98.1 F (36.7 C) 98.2 F (36.8 C) 98.3 F (36.8 C)  TempSrc:  Oral Oral Oral  SpO2: 94% 97% 95% 95%  Weight:      Height:        Intake/Output Summary (Last 24 hours) at 09/01/2023 1246 Last data filed at 09/01/2023 0400 Gross per 24 hour  Intake 328.33 ml  Output 275 ml  Net 53.33 ml   Filed Weights   08/28/23 1256  Weight: 71.7 kg    Examination: General exam: Appears calm and comfortable, NAD  Respiratory system: No work of breathing, symmetric chest wall expansion Cardiovascular system: S1 & S2 heard, RRR.  Gastrointestinal system: Abdomen is nondistended, soft and nontender.  Neuro: Alert and oriented.  Speech is slurred and sometimes incoherent.  Is able to answer all questions and follow commands Extremities: Symmetric, expected  ROM Skin: No rashes, lesions Psychiatry: Demonstrates appropriate judgement and insight. Mood & affect appropriate for situation.   Assessment & Plan:  Principal Problem:   Acute kidney injury superimposed on chronic kidney disease (HCC) Active Problems:   Syncope, vasovagal   Rhabdomyolysis   Dyslipidemia   GERD without esophagitis   Parkinson disease (HCC)   Anxiety and depression   Chronic obstructive pulmonary disease (COPD) (HCC)   Type 2 diabetes mellitus with stage 3 chronic kidney disease (HCC)   Syncope - DDx: Orthostatic hypotension, arrhythmia, polypharmacy - Orthostatics positive. - Echocardiogram with preserved EF, grade 1 diastolic dysfunction.  Clinically euvolemic. - Rehydrated with 2 L NS - Continue monitor on telemetry.  No arrhythmias noted so far - PT/OT evals, currently recommending home health.  Patient is not yet independent enough to discharge home - Have discussed these recommendations with her son, he believes that she would benefit from rehab.  Polypharmacy Chronic pain Opioid dependence - Patient is on multiple psychiatric medications, duplicate antihistamine therapy, as well as chronic opioid therapy - Home meds include: BuSpar , Cymbalta , Flexeril , Xyzal , gabapentin , morphine , MS Contin , Seroquel , hydroxyzine . - Continue with daily medication de-escalation.  Thus far have discontinued hydroxyzine , decreased gabapentin , discontinued immediate release morphine . - For now we will continue with home dose oxycodone  to avoid withdrawal.  Blood pressures are improving - Have advised patient to follow closely with her pain management team to consider further de-escalation especially of her opioid therapy. - Had extensive  discussion with the patient as well as with her son regarding concerns of polypharmacy.  They endorsed understanding - PT/OT currently recommending home health.  I have significant concerns about her ability to manage her health needs at  home  UTI - UA with large leukocytes and bacteria.  Foul-smelling per RN - Has been initiated on ceftriaxone .  Will follow urine culture.  Throat soreness - On exam she has dry mouth with some cobblestoning as evidence of postnasal drip - Have ordered for Flonase  and saline spray.  Urinary retention - Failed straight cath attempts.  Foley catheter placed on 7/12.  Patient much more alert today and medications have been de-escalated - Will remove Foley and proceed with trial of void.  If bladder scans indicate retention again we will replace Foley and plan to discharge with Foley and outpatient urology referral.  MRSA screen positive - No concern for active MRSA infection - Contact precautions per hospital policy - Consider decolonization at discharge  AKI Hypokalemia Hypomagnesemia - Baseline creatinine less than 1 - Creatinine on arrival 2.5, likely multifactorial prerenal from dehydration and ATN from hypotension - Creatinine continues to improve now with IV fluids - Continue electrolyte replacement as needed.  Trend daily CMP.  Mag replacement today - Continue to avoid nephrotoxic medications. - Hold Jardiance  - Reduced gabapentin  as above.  Avoid hypotension as above.  Suspected Malnutrition - Albumin 2.0 - Will consult RD  Parkinson's disease - Continue home dose carbidopa  levodopa .  Anxiety Depression - On multiple psychiatric meds.  See above. - Continue to Deprescribe where able.  See above.  Type 2 diabetes - Most recent hemoglobin A1c 6.0%.  Well-controlled. - At home on Jardiance   Rhabdomyolysis - Status post IV fluids - Discontinue CK trend  GERD - Continue PPI  Dyslipidemia - On statin.  Hold given current rhabdo  COPD, not currently in exacerbation - Continue home meds   DVT prophylaxis: Lovenox    Code Status: Full Code Disposition: If continues to improve will likely discharge home tomorrow with home health  Consultants:    Procedures:     Antimicrobials:  Anti-infectives (From admission, onward)    Start     Dose/Rate Route Frequency Ordered Stop   08/31/23 2200  cefTRIAXone  (ROCEPHIN ) 1 g in sodium chloride  0.9 % 100 mL IVPB        1 g 200 mL/hr over 30 Minutes Intravenous Every 24 hours 08/31/23 2041         Data Reviewed: I have personally reviewed following labs and imaging studies CBC: Recent Labs  Lab 08/28/23 2159 08/29/23 0225 08/30/23 0820 08/31/23 0908 09/01/23 0234  WBC 7.7 7.6 8.2 10.3 8.2  NEUTROABS  --   --  6.8 8.5* 6.3  HGB 9.3* 9.7* 10.2* 9.3* 8.9*  HCT 27.6* 28.0* 27.7* 26.8* 25.6*  MCV 91.7 89.5 87.7 87.9 88.6  PLT 190 190 201 243 231   Basic Metabolic Panel: Recent Labs  Lab 08/28/23 1259 08/28/23 2159 08/29/23 0225 08/30/23 0820 08/31/23 0908 09/01/23 0234  NA 132*  --  131* 134* 132* 132*  K 3.0*  --  3.1* 2.9* 4.2 3.8  CL 96*  --  99 105 104 106  CO2 24  --  21* 20* 20* 18*  GLUCOSE 92  --  101* 105* 134* 115*  BUN 31*  --  30* 24* 20 18  CREATININE 2.55* 2.20* 2.36* 1.61* 1.55* 1.26*  CALCIUM  7.4*  --  7.1* 7.4* 7.6* 7.4*  MG  --   --   --   --   --  1.5*  PHOS  --   --   --   --   --  2.6   GFR: Estimated Creatinine Clearance: 46.7 mL/min (A) (by C-G formula based on SCr of 1.26 mg/dL (H)). Liver Function Tests: Recent Labs  Lab 08/30/23 0820 08/31/23 0908 09/01/23 0234  AST 54* 58* 52*  ALT <5 7 <5  ALKPHOS 134* 138* 138*  BILITOT 2.3* 2.1* 1.6*  PROT 4.4* 4.6* 4.9*  ALBUMIN 1.9* 2.0* 2.0*   CBG: No results for input(s): GLUCAP in the last 168 hours.  Recent Results (from the past 240 hours)  MRSA Next Gen by PCR, Nasal     Status: Abnormal   Collection Time: 08/29/23  5:20 PM   Specimen: Nasal Mucosa; Nasal Swab  Result Value Ref Range Status   MRSA by PCR Next Gen DETECTED (A) NOT DETECTED Final    Comment: RESULT CALLED TO, READ BACK BY AND VERIFIED WITH: BYNUM, JOYYA D., LPN @2044  08/30/2023 COP (NOTE) The GeneXpert MRSA Assay (FDA approved  for NASAL specimens only), is one component of a comprehensive MRSA colonization surveillance program. It is not intended to diagnose MRSA infection nor to guide or monitor treatment for MRSA infections. Test performance is not FDA approved in patients less than 60 years old. Performed at [facility], [address] [place], [place] [address]   Gastrointestinal Panel by PCR , Stool     Status: None   Collection Time: 09/01/23 12:50 AM   Specimen: Stool  Result Value Ref Range Status   Campylobacter species NOT DETECTED NOT DETECTED Final   Plesimonas shigelloides NOT DETECTED NOT DETECTED Final   Salmonella species NOT DETECTED NOT DETECTED Final   Yersinia enterocolitica NOT DETECTED NOT DETECTED Final   Vibrio species NOT DETECTED NOT DETECTED Final   Vibrio cholerae NOT DETECTED NOT DETECTED Final   Enteroaggregative E coli (EAEC) NOT DETECTED NOT DETECTED Final   Enteropathogenic E coli (EPEC) NOT DETECTED NOT DETECTED Final   Enterotoxigenic E coli (ETEC) NOT DETECTED NOT DETECTED Final   Shiga like toxin producing E coli (STEC) NOT DETECTED NOT DETECTED Final   Shigella/Enteroinvasive E coli (EIEC) NOT DETECTED NOT DETECTED Final   Cryptosporidium NOT DETECTED NOT DETECTED Final   Cyclospora cayetanensis NOT DETECTED NOT DETECTED Final   Entamoeba histolytica NOT DETECTED NOT DETECTED Final   Giardia lamblia NOT DETECTED NOT DETECTED Final   Adenovirus F40/41 NOT DETECTED NOT DETECTED Final   Astrovirus NOT DETECTED NOT DETECTED Final   Norovirus GI/GII NOT DETECTED NOT DETECTED Final   Rotavirus A NOT DETECTED NOT DETECTED Final   Sapovirus (I, II, IV, and V) NOT DETECTED NOT DETECTED Final    Comment: Performed at [facility], [address], [place], [place] [address]     Radiology Studies: No results found.   Scheduled Meds:  busPIRone   7.5 mg Oral BID   carbidopa -levodopa   1 tablet Oral QHS   carbidopa -levodopa   1.5 tablet Oral TID    Chlorhexidine  Gluconate Cloth  6 each Topical Daily   colchicine   0.6 mg Oral Daily   cyclobenzaprine   10 mg Oral QHS   DULoxetine   60 mg Oral Daily   enoxaparin  (LOVENOX ) injection  40 mg Subcutaneous Q24H   fluticasone   2 spray Each Nare Daily   gabapentin   400 mg Oral TID   ipratropium  2 spray Each Nare QID   montelukast   10 mg Oral QHS   mupirocin  ointment  1 Application Nasal BID  oxyCODONE   15 mg Oral Q12H   pantoprazole   40 mg Oral Daily   QUEtiapine   25 mg Oral QHS   theophylline   400 mg Oral Daily   umeclidinium bromide   1 puff Inhalation Daily   Continuous Infusions:  cefTRIAXone  (ROCEPHIN )  IV Stopped (08/31/23 2307)      LOS: 3 days  MDM: Patient is high risk for one or more organ failure.  They necessitate ongoing hospitalization for continued IV therapies and subsequent lab monitoring. Total time spent interpreting labs and vitals, reviewing the medical record, coordinating care amongst consultants and care team members, directly assessing and discussing care with the patient and/or family: 55 min  Pamelia Botto, DO [facility]  To contact the attending physician between 7A-7P please use Epic Chat. To contact the covering physician during after hours 7P-7A, please review Amion.  09/01/2023, 12:46 PM   *This document has been created with the assistance of dictation software. Please excuse typographical errors. *

## 2023-09-01 NOTE — Plan of Care (Signed)
  Problem: Education: Goal: Knowledge of General Education information will improve Description: Including pain rating scale, medication(s)/side effects and non-pharmacologic comfort measures Outcome: Progressing   Problem: Health Behavior/Discharge Planning: Goal: Ability to manage health-related needs will improve Outcome: Progressing   Problem: Activity: Goal: Risk for activity intolerance will decrease Outcome: Progressing   Problem: Coping: Goal: Level of anxiety will decrease Outcome: Progressing   Problem: Nutrition: Goal: Adequate nutrition will be maintained Outcome: Progressing   Problem: Elimination: Goal: Will not experience complications related to bowel motility Outcome: Progressing Goal: Will not experience complications related to urinary retention Outcome: Progressing   Problem: Safety: Goal: Ability to remain free from injury will improve Outcome: Progressing   Problem: Skin Integrity: Goal: Risk for impaired skin integrity will decrease Outcome: Progressing

## 2023-09-01 NOTE — Progress Notes (Signed)
 Initial Nutrition Assessment  DOCUMENTATION CODES:   Not applicable  INTERVENTION:  Liberalize to regular diet Ensure Plus High Protein po BID, each supplement provides 350 kcal and 20 grams of protein MVI with minerals daily. Banatrol BID to aid in diarrhea -provides 45kcal, 5g soluble fiber and 2g protein per serving. Consider probiotic if stooling pattern does not improve   NUTRITION DIAGNOSIS:   Inadequate oral intake related to lethargy/confusion as evidenced by per patient/family report.  GOAL:   Patient will meet greater than or equal to 90% of their needs  MONITOR:   PO intake, Supplement acceptance, Labs, Weight trends  REASON FOR ASSESSMENT:  Consult Assessment of nutrition requirement/status  ASSESSMENT:  [name] of COPD, DM, HTN, HLD, parkinson's diease, and GERD presented to ED after a fall at home, thought to be due to polypharmacy. Found to have AKI with dehydration.  RD working remotely  Unable to reach on room phone at this time. Noted that RN reports waxing and waning mentation/orientation level.   Consult received fro low albumin and concern for malnutrition. No weight loss seen in hx, but intake is lacking this admission. Will follow-up in-person for physical exam and nutrition hx.   At this time, will liberalize diet to allow for more diet choices and add nutrition supplements. Do note pt have excessive stools at this time as well. Will add oral fiber, but may benefit from probiotic supplement to support gut health as this could be associated with antibiotic treatment.   Admit / Current weight: 71.7 kg ? Accuracy, appears copied forward from last encounter. No weight loss see x ~2 years  Average Meal Intake: 7/11-7/12: 56% intake x 4 recorded meals  Nutritionally Relevant Medications: Scheduled Meds:  colchicine   0.6 mg Oral Daily   cyclobenzaprine   10 mg Oral QHS   pantoprazole   40 mg Oral Daily   Continuous Infusions:  cefTRIAXone   (ROCEPHIN )  IV Stopped (08/31/23 2307)   PRN Meds: loperamide , magnesium  hydroxide, ondansetron   Labs Reviewed: Sodium 132 Creatinine 1.26 Magnesium  1.5 HgbA1c 6.0% (05/01/23)  NUTRITION - FOCUSED PHYSICAL EXAM: Defer to in-person assessment  Diet Order:   Diet Order             Diet Heart Room service appropriate? Yes; Fluid consistency: Thin  Diet effective now                   EDUCATION NEEDS:   Not appropriate for education at this time  Skin:  Skin Assessment: Reviewed RN Assessment  Last BM:  7/13 - type 6 (noted 10 recorded stools in the last 24 hours)  Height:   Ht Readings from Last 1 Encounters:  08/28/23 5' 8 (1.727 m)    Weight:   Wt Readings from Last 1 Encounters:  08/28/23 71.7 kg    Ideal Body Weight:  63.6 kg  BMI:  Body mass index is 24.02 kg/m.  Estimated Nutritional Needs:  Kcal:  1600-1800 kcal/d Protein:  80-95 g/d Fluid:  1.6-1.8L/d    Vernell Lukes, RD, LDN, CNSC Registered Dietitian II Please reach out via secure chat

## 2023-09-01 NOTE — Plan of Care (Signed)

## 2023-09-02 ENCOUNTER — Other Ambulatory Visit: Payer: Self-pay | Admitting: Family Medicine

## 2023-09-02 DIAGNOSIS — E440 Moderate protein-calorie malnutrition: Secondary | ICD-10-CM | POA: Insufficient documentation

## 2023-09-02 DIAGNOSIS — N189 Chronic kidney disease, unspecified: Secondary | ICD-10-CM | POA: Diagnosis not present

## 2023-09-02 DIAGNOSIS — R55 Syncope and collapse: Secondary | ICD-10-CM | POA: Diagnosis not present

## 2023-09-02 DIAGNOSIS — M6282 Rhabdomyolysis: Secondary | ICD-10-CM | POA: Diagnosis not present

## 2023-09-02 DIAGNOSIS — N179 Acute kidney failure, unspecified: Secondary | ICD-10-CM | POA: Diagnosis not present

## 2023-09-02 LAB — COMPREHENSIVE METABOLIC PANEL WITH GFR
ALT: 6 U/L (ref 0–44)
AST: 61 U/L — ABNORMAL HIGH (ref 15–41)
Albumin: 2 g/dL — ABNORMAL LOW (ref 3.5–5.0)
Alkaline Phosphatase: 134 U/L — ABNORMAL HIGH (ref 38–126)
Anion gap: 7 (ref 5–15)
BUN: 15 mg/dL (ref 8–23)
CO2: 22 mmol/L (ref 22–32)
Calcium: 7.9 mg/dL — ABNORMAL LOW (ref 8.9–10.3)
Chloride: 105 mmol/L (ref 98–111)
Creatinine, Ser: 1.29 mg/dL — ABNORMAL HIGH (ref 0.44–1.00)
GFR, Estimated: 47 mL/min — ABNORMAL LOW (ref >60)
Glucose, Bld: 188 mg/dL — ABNORMAL HIGH (ref 70–99)
Potassium: 3.6 mmol/L (ref 3.5–5.1)
Sodium: 134 mmol/L — ABNORMAL LOW (ref 135–145)
Total Bilirubin: 1.3 mg/dL — ABNORMAL HIGH (ref 0.0–1.2)
Total Protein: 5 g/dL — ABNORMAL LOW (ref 6.5–8.1)

## 2023-09-02 LAB — CBC WITH DIFFERENTIAL/PLATELET
Abs Immature Granulocytes: 0.26 K/uL — ABNORMAL HIGH (ref 0.00–0.07)
Basophils Absolute: 0 K/uL (ref 0.0–0.1)
Basophils Relative: 0 %
Eosinophils Absolute: 0 K/uL (ref 0.0–0.5)
Eosinophils Relative: 0 %
HCT: 26.7 % — ABNORMAL LOW (ref 36.0–46.0)
Hemoglobin: 9.2 g/dL — ABNORMAL LOW (ref 12.0–15.0)
Immature Granulocytes: 4 %
Lymphocytes Relative: 14 %
Lymphs Abs: 0.9 K/uL (ref 0.7–4.0)
MCH: 31 pg (ref 26.0–34.0)
MCHC: 34.5 g/dL (ref 30.0–36.0)
MCV: 89.9 fL (ref 80.0–100.0)
Monocytes Absolute: 0.4 K/uL (ref 0.1–1.0)
Monocytes Relative: 6 %
Neutro Abs: 5.1 K/uL (ref 1.7–7.7)
Neutrophils Relative %: 76 %
Platelets: 227 K/uL (ref 150–400)
RBC: 2.97 MIL/uL — ABNORMAL LOW (ref 3.87–5.11)
RDW: 14.9 % (ref 11.5–15.5)
WBC: 6.7 K/uL (ref 4.0–10.5)
nRBC: 0 % (ref 0.0–0.2)

## 2023-09-02 LAB — MAGNESIUM: Magnesium: 2.3 mg/dL (ref 1.7–2.4)

## 2023-09-02 LAB — PHOSPHORUS: Phosphorus: 3.1 mg/dL (ref 2.5–4.6)

## 2023-09-02 MED ORDER — ENSURE PLUS HIGH PROTEIN PO LIQD
237.0000 mL | Freq: Three times a day (TID) | ORAL | Status: DC
  Administered 2023-09-02 – 2023-09-18 (×24): 237 mL via ORAL

## 2023-09-02 MED ORDER — CHLORHEXIDINE GLUCONATE CLOTH 2 % EX PADS
6.0000 | MEDICATED_PAD | Freq: Every day | CUTANEOUS | Status: DC
  Administered 2023-09-02 – 2023-09-18 (×16): 6 via TOPICAL

## 2023-09-02 NOTE — Progress Notes (Signed)
 Physical Therapy Treatment Patient Details Name: Tricia [name] MRN: [id number] DOB: [birth year] Today's Date: 09/02/2023   History of Present Illness Pt is a 63 y.o. female admitted with acute kidney injury superimposed on CKD, UTI, orthostatic hypotension with syncope, and rhabdomyolysis.  PMHx includes: COPD, Asthma, osteoarthritis, anxiety, depression, TypeII DM, Diastolic Dysfunction, GERD, HTN, Dyslipidemia, Rhabdomyolitis 2/2 fall, Parkinson's Disease, and syncope.    PT Comments  Pt was pleasant and motivated to participate during the session and put forth good effort throughout. Session limited to below supine therex per nursing request secondary to orthostatic hypotension this date. Pt tolerated therex well including with manual resistance when able/appropriate with no adverse symptoms.  Recommendations left unchanged from prior session secondary to functional mobility unable to be assessed.  Pt will benefit from continued PT services upon discharge to safely address deficits listed in patient problem list for decreased caregiver assistance and eventual return to PLOF.      If plan is discharge home, recommend the following: Assistance with cooking/housework;Assist for transportation;Help with stairs or ramp for entrance   Can travel by private vehicle        Equipment Recommendations  None recommended by PT    Recommendations for Other Services       Precautions / Restrictions Precautions Precautions: Fall Restrictions Weight Bearing Restrictions Per Provider Order: No Other Position/Activity Restrictions: Orthostatic Hypotension     Mobility  Bed Mobility               General bed mobility comments: Supine therex only per nursing request secondary to orthostatic hypotension this date    Transfers                        Ambulation/Gait                   Stairs             Wheelchair Mobility     Tilt Bed    Modified  Rankin (Stroke Patients Only)       Balance                                            Communication Communication Communication: No apparent difficulties  Cognition Arousal: Alert Behavior During Therapy: WFL for tasks assessed/performed                             Following commands: Intact      Cueing Cueing Techniques: Verbal cues, Tactile cues  Exercises Total Joint Exercises Ankle Circles/Pumps: Strengthening, Both, 5 reps, 10 reps (with manual resistance) Quad Sets: Strengthening, Both, 10 reps Gluteal Sets: Strengthening, Both, 10 reps Towel Squeeze: Strengthening, Both, 10 reps Short Arc Quad: Strengthening, Both, 10 reps (with manual resistance) Heel Slides: Strengthening, Both, 10 reps Hip ABduction/ADduction: Strengthening, Both, 10 reps Straight Leg Raises: Strengthening, Both, 10 reps Bridges: Strengthening, Both, 10 reps (low amplitude) Other Exercises Other Exercises: RLE and LLE supine leg press x 10 each with manual resistance    General Comments        Pertinent Vitals/Pain Pain Assessment Pain Assessment: No/denies pain    Home Living                          Prior Function  PT Goals (current goals can now be found in the care plan section) Progress towards PT goals: PT to reassess next treatment    Frequency    Min 2X/week      PT Plan      Co-evaluation              AM-PAC PT 6 Clicks Mobility   Outcome Measure  Help needed turning from your back to your side while in a flat bed without using bedrails?: None Help needed moving from lying on your back to sitting on the side of a flat bed without using bedrails?: A Little Help needed moving to and from a bed to a chair (including a wheelchair)?: None Help needed standing up from a chair using your arms (e.g., wheelchair or bedside chair)?: A Little Help needed to walk in hospital room?: A Little Help needed climbing 3-5  steps with a railing? : A Little 6 Click Score: 20    End of Session   Activity Tolerance: Patient tolerated treatment well Patient left: in bed;with call bell/phone within reach;with bed alarm set Nurse Communication: Mobility status PT Visit Diagnosis: Muscle weakness (generalized) (M62.81);Difficulty in walking, not elsewhere classified (R26.2)     Time: 8869-8851 PT Time Calculation (min) (ACUTE ONLY): 18 min  Charges:    $Therapeutic Exercise: 8-22 mins PT General Charges $$ ACUTE PT VISIT: 1 Visit                    D. Scott Caliegh Middlekauff PT, DPT 09/02/23, 12:02 PM

## 2023-09-02 NOTE — Plan of Care (Signed)

## 2023-09-02 NOTE — Progress Notes (Addendum)
 Nutrition Follow-up  DOCUMENTATION CODES:   Non-severe (moderate) malnutrition in context of chronic illness  INTERVENTION:   -Obtain new wt -Downgrade diet to dysphagia 3 for ease of intake (pt with no teeth). -Increase Ensure Plus High Protein po TID, each supplement provides 350 kcal and 20 grams of protein  -Continue 60 ml Banatrol BID -Continue MVI with minerals daily  NUTRITION DIAGNOSIS:   Moderate Malnutrition related to chronic illness (COPD, Parkinson's disease) as evidenced by mild fat depletion, moderate fat depletion, mild muscle depletion, moderate muscle depletion.  Ongoing  GOAL:   Patient will meet greater than or equal to 90% of their needs  Progressing   MONITOR:   PO intake, Supplement acceptance  REASON FOR ASSESSMENT:   Consult Assessment of nutrition requirement/status  ASSESSMENT:   [name] of COPD, DM, HTN, HLD, parkinson's diease, and GERD presented to ED after a fall at home, thought to be due to polypharmacy. Found to have AKI with dehydration.  Reviewed I/O's: +380 ml x 24 hours and +3.1 L since admission  UOP: 600 ml x 24 hours   Per RN notes, pt has been incontinent of bowels and has been alert to self only.   Spoke with pt at bedside, who was pleasant and in good spirits today. When asked how she reports how she is feeling she reports I've been better. Pt shares that her stomach often hurts and states everything I eat runs through me. Per pt, that has been occurring for 3 days PTA, but has improved since being admitted to the hospital. She has been focusing on liquids, such as water, juice, and milk. She reports drinking Ensure without difficulty. Noted meal completions 0-50%.   Reviewed wt hx; wt has been stable over the past 4 months, however, noted wt is identical from the past two encounters. Unsure if this is a stated weight vs actual weight. RD will obtain new wt to better assess weight trends.   Discussed importance of  good meal and supplement intake to promote healing. Noted pt with no teeth- she reports she had her teeth extracted a few years ago and thinks a mechanically altered diet would be helpful. Pt also amenable to continue Ensure supplements.   Medications reviewed and include buspar , sinemet , colchicine , lovenox , oxytocin, and rocephin .  Labs reviewed: Na: 134, CBGS: 238.   NUTRITION - FOCUSED PHYSICAL EXAM:  Flowsheet Row Most Recent Value  Orbital Region Moderate depletion  Upper Arm Region Mild depletion  Thoracic and Lumbar Region No depletion  Buccal Region Mild depletion  Temple Region Moderate depletion  Clavicle Bone Region Moderate depletion  Clavicle and Acromion Bone Region Moderate depletion  Scapular Bone Region Moderate depletion  Dorsal Hand No depletion  Patellar Region No depletion  Anterior Thigh Region No depletion  Posterior Calf Region No depletion  Edema (RD Assessment) None  Hair Reviewed  Eyes Reviewed  Mouth Reviewed  Skin Reviewed  Nails Reviewed    Diet Order:   Diet Order             DIET DYS 3 Fluid consistency: Thin  Diet effective now                   EDUCATION NEEDS:   Education needs have been addressed  Skin:  Skin Assessment: Reviewed RN Assessment  Last BM:  09/02/23 (type 6)  Height:   Ht Readings from Last 1 Encounters:  08/28/23 5' 8 (1.727 m)    Weight:   Wt  Readings from Last 1 Encounters:  08/28/23 71.7 kg    Ideal Body Weight:  63.6 kg  BMI:  Body mass index is 24.02 kg/m.  Estimated Nutritional Needs:   Kcal:  1900-2100  Protein:  100-115 grams  Fluid:  1.9-2.1 L    Margery ORN, RD, LDN, CDCES Registered Dietitian III Certified Diabetes Care and Education Specialist If unable to reach this RD, please use RD Inpatient group chat on secure chat between hours of 8am-4 pm daily

## 2023-09-02 NOTE — TOC Progression Note (Addendum)
 Transition of Care [facility]) - Progression Note    Patient Details  Name: Tricia [name] MRN: [id number] Date of Birth: 02/17/1961  [facility] of [facility] CM/SW Contact  Racheal LITTIE Schimke, RN Phone Number: 09/02/2023, 12:55 PM  Clinical Narrative: Spoke with patient via phone about HHPT/OT/Aide recommendations and preference. Patient receptive to recommendations, no preference voiced. HH search started, waiting on reply from [facility]. 4:20 pm Leopoldo Cones consents to HHPT/OT, no Aide. Hospitalist notified.           Expected Discharge Plan and Services                                               Social Determinants of Health (SDOH) Interventions SDOH Screenings   Food Insecurity: No Food Insecurity (08/29/2023)  Housing: Low Risk  (08/29/2023)  Transportation Needs: No Transportation Needs (08/29/2023)  Utilities: Not At Risk (08/29/2023)  Alcohol Screen: Low Risk  (03/07/2023)  Depression (PHQ2-9): High Risk (05/01/2023)  Financial Resource Strain: Patient Declined (06/18/2023)   Received from [facility]  Physical Activity: Sufficiently Active (03/07/2023)  Social Connections: Socially Isolated (08/29/2023)  Stress: No Stress Concern Present (03/07/2023)  Tobacco Use: High Risk (08/28/2023)  Health Literacy: Adequate Health Literacy (03/07/2023)    Readmission Risk Interventions     No data to display

## 2023-09-02 NOTE — Consult Note (Signed)
 Urology Consult  I have been asked to see the patient by Dr. Alexandra Dezii, for evaluation and management of abnormal vaginal exam and urinary retention.  Chief Complaint: Fall   History of Present Illness: Tricia [name] is a 63 y.o. year old woman with a past medical history segment for CAD PAD, asthma, osteoarthritis, anxiety, depression, type 2 diabetes, diastolic dysfunction, GERD, hypertension, dyslipidemia and urinary retention who presented to the ED after a fall secondary to weakness.  She went into urinary retention on July 12 and a Foley catheter was placed.  It was then removed the next day for a voiding trial.  During the night her bladder scans peaked at 412 mL and an I and O was ordered.    During the [facility] prior to the I & O cath, they stated the urethra was difficult to find due to multiple growths.  A large multilevel growth protruding from the urethral entrance into multiple growths visible in the open vaginal canal.  They were able to insert a straight catheter and collected cloudy tea colored urine with debris, some blood clots and urine was purulent.  They stated they got 600 mL out of the bladder at that time.  The patient denied any history of abnormal growth in the vaginal area and states she has never felt anything in the perineum with wiping.    We had seen her in 2023 for urinary retention that was felt to be secondary to Lasix  and underlying UTI.  She did pass a voiding trial with us  and follow up was recommend, but she did not follow up for unknown reasons.      Past Medical History:  Diagnosis Date   Anxiety    Arthritis    joints and hands/ knees   Asthma    uses inhaler   Benign essential tremor    head   Cervical dystonia    neck pain   Cholesteatoma of left ear    x2   COPD (chronic obstructive pulmonary disease) (HCC)    Cough    Depression    Diabetes mellitus without complication (HCC)    type 2   Diastolic dysfunction     Dyspnea    Dysrhythmia    diastolic dysfunction   GERD (gastroesophageal reflux disease)    Headache    migraines/ one per week   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   Hyperlipidemia    Hypertension    Motion sickness    boat   Neuromuscular disorder (HCC)    neuropathy feet and hands( nerve damage)   Wears dentures    upper and lower    Past Surgical History:  Procedure Laterality Date   CARPAL TUNNEL RELEASE Bilateral    x2 right, 1x on left   COLONOSCOPY     COLONOSCOPY WITH PROPOFOL  N/A 04/25/2017   Procedure: COLONOSCOPY WITH PROPOFOL ;  Surgeon: Jinny Carmine, MD;  Location: [facility];  Service: Endoscopy;  Laterality: N/A;  diabetic-oral med   DILATION AND CURETTAGE OF UTERUS     ESOPHAGOGASTRODUODENOSCOPY (EGD) WITH PROPOFOL  N/A 01/10/2021   Procedure: ESOPHAGOGASTRODUODENOSCOPY (EGD) WITH PROPOFOL ;  Surgeon: Janalyn Keene NOVAK, MD;  Location: [facility];  Service: Endoscopy;  Laterality: N/A;  Diabetic   EXTERNAL EAR SURGERY Left    x2   POLYPECTOMY  04/25/2017   Procedure: POLYPECTOMY INTESTINAL;  Surgeon: Jinny Carmine, MD;  Location: [facility];  Service: Endoscopy;;   SPINE  SURGERY     herniated disc   TUBAL LIGATION      Home Medications:    Allergies:  Allergies  Allergen Reactions   Augmentin [Amoxicillin-Pot Clavulanate] Diarrhea   Penicillins Itching    Family History  Problem Relation Age of Onset   Emphysema Mother    Anxiety disorder Mother    Stroke Father    Throat cancer Father    Lung cancer Maternal Grandmother    Lung cancer Maternal Grandfather    Hypertension Daughter    Diabetes Daughter    Multiple sclerosis Daughter    Bipolar disorder Daughter    Cervical cancer Daughter    Bipolar disorder Daughter    Drug abuse Daughter    Lung cancer Maternal Aunt    Lung cancer Maternal Uncle     Social History:  reports that she has been smoking cigarettes. She started smoking about 46 years  ago. She has a 46.3 pack-year smoking history. She has never used smokeless tobacco. She reports that she does not drink alcohol and does not use drugs.  ROS: A complete review of systems was performed.  All systems are negative except for pertinent findings as noted.  Physical Exam:  Vital signs in last 24 hours: Temp:  [98.2 F (36.8 C)-98.5 F (36.9 C)] 98.2 F (36.8 C) (07/14 0950) Pulse Rate:  [82-106] 99 (07/14 0950) Resp:  [17-20] 18 (07/14 0950) BP: (67-130)/(49-69) 117/58 (07/14 0950) SpO2:  [89 %-95 %] 93 % (07/14 0950) Constitutional:  Well nourished. Alert and oriented, No acute distress. HEENT: [place] AT, moist mucus membranes.  Trachea midline Cardiovascular: No clubbing, cyanosis, or edema. Respiratory: Normal respiratory effort, no increased work of breathing. GI: Abdomen is soft, nontender, nondistended GU: No CVA tenderness.  No bladder fullness or masses.  Recession of labia minora, dry, pale vulvar vaginal mucosa and loss of mucosal ridges and folds.  An urethral caruncle is noted, explaining the -multi-lobed growth protruding from the urethra entrance seen on nurses exam.  No bladder fullness, tenderness or masses. Pale vagina mucosa, poor estrogen effect, the multiple growth visible on the open vaginal canal may be condylomata or simply sequela of vaginal atrophy Anus and perineum are without rashes or lesions.    Neurologic: Grossly intact, no focal deficits, moving all 4 extremities. Psychiatric: Normal mood and affect.     Laboratory Data:  Recent Labs    08/31/23 0908 09/01/23 0234 09/02/23 0434  WBC 10.3 8.2 6.7  HGB 9.3* 8.9* 9.2*  HCT 26.8* 25.6* 26.7*   Recent Labs    08/31/23 0908 09/01/23 0234 09/02/23 0434  NA 132* 132* 134*  K 4.2 3.8 3.6  CL 104 106 105  CO2 20* 18* 22  GLUCOSE 134* 115* 188*  BUN 20 18 15   CREATININE 1.55* 1.26* 1.29*  CALCIUM  7.6* 7.4* 7.9*   No results for input(s): LABPT, INR in the last 72 hours. No results  for input(s): LABURIN in the last 72 hours. Results for orders placed or performed during the hospital encounter of 08/28/23  MRSA Next Gen by PCR, Nasal     Status: Abnormal   Collection Time: 08/29/23  5:20 PM   Specimen: Nasal Mucosa; Nasal Swab  Result Value Ref Range Status   MRSA by PCR Next Gen DETECTED (A) NOT DETECTED Final    Comment: RESULT CALLED TO, READ BACK BY AND VERIFIED WITH: BYNUM, JOYYA D., LPN @2044  08/30/2023 COP (NOTE) The GeneXpert MRSA Assay (FDA approved for NASAL specimens only), is  one component of a comprehensive MRSA colonization surveillance program. It is not intended to diagnose MRSA infection nor to guide or monitor treatment for MRSA infections. Test performance is not FDA approved in patients less than 63 years old. Performed at [facility], [address] [place], [place] [address]   Urine Culture (for pregnant, neutropenic or urologic patients or patients with an indwelling urinary catheter)     Status: Abnormal (Preliminary result)   Collection Time: 08/31/23  9:08 AM   Specimen: Urine, Catheterized  Result Value Ref Range Status   Specimen Description   Final    URINE, CATHETERIZED Performed at [facility], [address] [place], [place] [address]    Special Requests   Final    NONE Performed at [facility], [address], [place], [place] [address]    Culture (A)  Final    >=100,000 COLONIES/mL VONNE NEGATIVE RODS SUSCEPTIBILITIES TO FOLLOW Performed at [facility], [address], [place], [place] [address]    Report Status PENDING  Incomplete  Gastrointestinal Panel by PCR , Stool     Status: None   Collection Time: 09/01/23 12:50 AM   Specimen: Stool  Result Value Ref Range Status   Campylobacter species NOT DETECTED NOT DETECTED Final   Plesimonas shigelloides NOT DETECTED NOT DETECTED Final   Salmonella species NOT DETECTED NOT DETECTED Final   Yersinia enterocolitica NOT DETECTED NOT  DETECTED Final   Vibrio species NOT DETECTED NOT DETECTED Final   Vibrio cholerae NOT DETECTED NOT DETECTED Final   Enteroaggregative E coli (EAEC) NOT DETECTED NOT DETECTED Final   Enteropathogenic E coli (EPEC) NOT DETECTED NOT DETECTED Final   Enterotoxigenic E coli (ETEC) NOT DETECTED NOT DETECTED Final   Shiga like toxin producing E coli (STEC) NOT DETECTED NOT DETECTED Final   Shigella/Enteroinvasive E coli (EIEC) NOT DETECTED NOT DETECTED Final   Cryptosporidium NOT DETECTED NOT DETECTED Final   Cyclospora cayetanensis NOT DETECTED NOT DETECTED Final   Entamoeba histolytica NOT DETECTED NOT DETECTED Final   Giardia lamblia NOT DETECTED NOT DETECTED Final   Adenovirus F40/41 NOT DETECTED NOT DETECTED Final   Astrovirus NOT DETECTED NOT DETECTED Final   Norovirus GI/GII NOT DETECTED NOT DETECTED Final   Rotavirus A NOT DETECTED NOT DETECTED Final   Sapovirus (I, II, IV, and V) NOT DETECTED NOT DETECTED Final    Comment: Performed at [facility], [address], [place], [place] [address]     Radiologic Imaging: No results found.  Impression/Assessment:  63 year old woman who is admitted after she had suffered a fall after a syncopal episode and was found to have an AKI but subsequently developed urinary retention while hospitalized.  - Explained my physical exam findings to the nursing staff and stated that it would not preclude placement of a Foley catheter  -Explained to the patient and the staff that these are findings of atrophic vaginitis and that we can manage these on an outpatient basis with vaginal estrogen cream   Plan:  - replace Foley - She will discharge with Foley catheter in place - Nursing staff to educate the patient on Foley catheter care and how to switch from leg bags to overnight bags - Will arrange for outpatient voiding trial  09/02/2023, 12:43 PM  Tobe Kervin, PA-C

## 2023-09-02 NOTE — Progress Notes (Signed)
 Occupational Therapy Treatment Patient Details Name: Tricia [name] MRN: [id number] DOB: Feb 12, 1961 Today's Date: 09/02/2023   History of present illness Pt is a 63 y.o. female admitted with acute kidney injury superimposed on CKD, UTI, orthostatic hypotension with syncope, and rhabdomyolysis.  PMHx includes: COPD, Asthma, osteoarthritis, anxiety, depression, TypeII DM, Diastolic Dysfunction, GERD, HTN, Dyslipidemia, Rhabdomyolitis 2/2 fall, Parkinson's Disease, and syncope.   OT comments  Pt seen for limited bed level ADL session. Per RN, bed level session only given BP issues today. Pt received supine in bed, lethargic, and confused. Pt oriented to self only, difficulty following simple commands. Pt able to state with prompting that she does not feel like herself. Denies pain or nausea. Difficulty expressing/describing how she feels. Pt asks this therapist about Pluto but unable to share details other than it's a dog. Pt required set up and VC to initiate washing her face but demo'd difficulty initiating and continuing grooming task of combing her hair, ultimately requiring MOD-MAX A to complete. Will further assess EOB/OOB ADL as able. Pt continues to benefit from skilled OT services.       If plan is discharge home, recommend the following:  A little help with walking and/or transfers;A lot of help with bathing/dressing/bathroom;Direct supervision/assist for medications management;Supervision due to cognitive status;Direct supervision/assist for financial management;Assistance with cooking/housework;Assist for transportation;Help with stairs or ramp for entrance   Equipment Recommendations  Other (comment) (defer)    Recommendations for Other Services      Precautions / Restrictions Precautions Precautions: Fall Recall of Precautions/Restrictions: Impaired Restrictions Weight Bearing Restrictions Per Provider Order: No Other Position/Activity Restrictions: Orthostatic  Hypotension       Mobility Bed Mobility               General bed mobility comments: Deferred per nursing request 2/2 orthostatic hypotension this date    Transfers                         Balance                                           ADL either performed or assessed with clinical judgement   ADL Overall ADL's : Needs assistance/impaired     Grooming: Wash/dry face;Brushing hair;Bed level Grooming Details (indicate cue type and reason): Remained bed level per RN 2/2 low BP; required set up and VC for washing her face; required VC for initiating and MOD-MAX A to complete combing her hair as she demo'd difficulty initiating and continuing task on her own.                                    Extremity/Trunk Assessment              Vision       Restaurant manager, fast food Communication: No apparent difficulties   Cognition Arousal: Lethargic Behavior During Therapy: WFL for tasks assessed/performed Cognition: No family/caregiver present to determine baseline             OT - Cognition Comments: Pt oriented to self only, appears confused, asks therapist about Pluto but unable to share more detail, difficulty with maintaining attention to tasks requiring multimodal cues and ultimately assist to complete familiar grooming  task                 Following commands: Impaired Following commands impaired: Follows one step commands with increased time, Follows one step commands inconsistently, Follows multi-step commands inconsistently      Cueing   Cueing Techniques: Verbal cues, Tactile cues, Visual cues  Exercises      Shoulder Instructions       General Comments      Pertinent Vitals/ Pain       Pain Assessment Pain Assessment: No/denies pain  Home Living                                          Prior Functioning/Environment               Frequency  Min 2X/week        Progress Toward Goals  OT Goals(current goals can now be found in the care plan section)  Progress towards OT goals: OT to reassess next treatment  Acute Rehab OT Goals Patient Stated Goal: to feel better OT Goal Formulation: With patient Time For Goal Achievement: 09/14/23 Potential to Achieve Goals: Good  Plan      Co-evaluation                 AM-PAC OT 6 Clicks Daily Activity     Outcome Measure   Help from another person eating meals?: None Help from another person taking care of personal grooming?: A Lot Help from another person toileting, which includes using toliet, bedpan, or urinal?: A Lot Help from another person bathing (including washing, rinsing, drying)?: A Lot Help from another person to put on and taking off regular upper body clothing?: A Little Help from another person to put on and taking off regular lower body clothing?: A Lot 6 Click Score: 15    End of Session    OT Visit Diagnosis: Unsteadiness on feet (R26.81);Muscle weakness (generalized) (M62.81);History of falling (Z91.81)   Activity Tolerance Patient limited by lethargy;Other (comment) (cognition)   Patient Left in bed;with call bell/phone within reach;with bed alarm set   Nurse Communication Mobility status;Other (comment) (cognition)        Time: 8545-8492 OT Time Calculation (min): 13 min  Charges: OT General Charges $OT Visit: 1 Visit OT Treatments $Self Care/Home Management : 8-22 mins  Warren SAUNDERS., MPH, MS, OTR/L ascom [phone] 09/02/23, 3:34 PM

## 2023-09-02 NOTE — Progress Notes (Signed)
 Pt has been confused since beginning of shift: Pt oriented to self only and at times situation or place (hospital). Pt has no correct sense of time. Pt remains incontinent of bowels multiple times and 1 small void w BM. Pt has poor PO intake re: fluids although encouraged frequently. Pt requests drinks and falls asleep during drinking them or before.Pt frequently drowsy. Pt has been hallucinating during the night seeing people that are not present and seeing objects that are not there. Upon initial assessment, pt's right index finger noted to be red, irritated and missing skin. Pt actively picking at finger and one next to it trying to remove unidentifiable object that is not there. Pt is persistent with picking and is unable to tell this RN what she is trying to remove from finger.    Pt requesting a 'lighter' for her 'cigarette'. Pt informed no lighters or smoking in hospital. No cigarettes in room. Pt did require Neb treatment for wheezing. Pt has congested cough and able to produce thick yellow phlegm. Pt offered nicotine patch and politely refused.   Pt had 1 small incontinent void with BM. Bladder scans during night peaked  >429ml @ 0500. On call Dr. Lawence ordered I&O x1. Pt states she cannot actively void when encouraged.   This RN and a fellow RN performed peri-care prior to I&O cath with flashlight. Urethra difficult to find due to multiple 'growths'. Large multi-lobed growth protruding from urethral entrance. Multiple growths visible in open vaginal canal. Upon inserting catheter, pt in acute pain. No visible trauma noted. Upon advancing catheter dark cloudy tea colored urine noted with light colored irregular shaped floating pieces. Multiple small blood clots noted. Urine eventually turned purulent and was difficult to drain bladder. Overall drainage after some time and manipulation of catheter produced 600 ml in catheter bag. Peri-care once more performed for purulent discharge. Pt denied any  history of abnormal growths in vaginal area. States she doesn't see down there often but has never 'felt anything'. Pt has large distended soft abdomen. Pt unable to visualize below abdomen.

## 2023-09-02 NOTE — Care Management Important Message (Signed)
 Important Message  Patient Details  Name: Tricia [name] MRN: [id number] Date of Birth: [birth year]   Important Message Given:  Yes - Medicare IM     Alvenia Treese W, CMA 09/02/2023, 2:48 PM

## 2023-09-02 NOTE — Progress Notes (Signed)
 PROGRESS NOTE    Tricia [name]  FMW:[id number] DOB: January 04, 1961 DOA: 08/28/2023 PCP: Sowles, Krichna, MD  Chief Complaint  Patient presents with   [facility] Course:  Tricia [name] is a 63 year old female with chronic pain, COPD, asthma, osteoarthritis, anxiety, depression, type 2 diabetes, diastolic CHF, GERD, hypertension, dyslipidemia, who presents to the ED with syncopal episode.  Patient cannot elaborate to me on what occurred prior to her admission.  She remembers sitting down at home and then woke up in the hospital. Initial vitals in the ED are 125/100, creatinine elevation of 2.55, CK6 59.  Noncon head CT without acute intracranial abnormalities.  Patient received 2 L NS and was admitted for observation.  Subjective: Patient is still profoundly orthostatic this morning.  She reports that she is feeling better.  Bedside RN endorses significant concerns about her ambulation.  Reports she is still very unsteady on her feet.  Physical therapy reports evaluation was cut short due to orthostasis  Objective: Vitals:   09/02/23 0748 09/02/23 0759 09/02/23 0950 09/02/23 1314  BP: (!) 83/61 (!) 67/49 (!) 117/58 (!) 105/59  Pulse: 100 82 99 94  Resp: 20 20 18 16   Temp: 98.2 F (36.8 C) 98.2 F (36.8 C) 98.2 F (36.8 C) 98.6 F (37 C)  TempSrc:      SpO2: 94% (!) 89% 93% 93%  Weight:      Height:        Intake/Output Summary (Last 24 hours) at 09/02/2023 1453 Last data filed at 09/02/2023 1300 Gross per 24 hour  Intake 740 ml  Output 600 ml  Net 140 ml   Filed Weights   08/28/23 1256  Weight: 71.7 kg    Examination: General exam: Appears calm and comfortable, NAD  Respiratory system: No work of breathing, symmetric chest wall expansion Cardiovascular system: S1 & S2 heard, RRR.  Gastrointestinal system: Abdomen is nondistended, soft and nontender.  Neuro: Alert and oriented.  Speech is slurred and sometimes incoherent.  Is able to answer all  questions and follow commands Extremities: Symmetric, expected ROM Skin: No rashes, lesions Psychiatry: Demonstrates appropriate judgement and insight. Mood & affect appropriate for situation.   Assessment & Plan:  Principal Problem:   Acute kidney injury superimposed on chronic kidney disease (HCC) Active Problems:   Syncope, vasovagal   Rhabdomyolysis   Dyslipidemia   GERD without esophagitis   Parkinson disease (HCC)   Anxiety and depression   Chronic obstructive pulmonary disease (COPD) (HCC)   Type 2 diabetes mellitus with stage 3 chronic kidney disease (HCC)   Malnutrition of moderate degree   Syncope - Likely multifactorial in setting of orthostatic hypotension, polypharmacy.  - Orthostatics positive. - Echocardiogram with preserved EF, grade 1 diastolic dysfunction.  Clinically euvolemic. - Rehydrated with 2 L NS - Continue monitor on telemetry.  No arrhythmias noted so far - PT/OT evals, initially physical therapy recommending home health.  RN endorses significant concerns with patient's ability to ambulate independently.  I discussed directly with physical therapy today who request another day in house for continued evaluations.  Presently patient is not independent enough to discharge home without 24/7 care. - I have discussed these recommendations with her son, he has similar concerns and believes she would benefit better from rehab or skilled nursing facility.   Orthostatic hypotension - Status post fluid resuscitation.  Improving some but chronic - Can apply TED hose while standing but doubt patient will be able to do this at home. -  Fall precautions - Polypharmacy and opioid therapy playing a role.  Continued de-escalation as below.  Polypharmacy Chronic pain Opioid dependence - Patient is on multiple psychiatric medications, duplicate antihistamine therapy, as well as chronic opioid therapy - Home meds include: BuSpar , Cymbalta , Flexeril , Xyzal , gabapentin ,  morphine , MS Contin , Seroquel , hydroxyzine . - Continue with daily medication de-escalation.  Thus far have discontinued hydroxyzine , decreased gabapentin , discontinued immediate release morphine . - For now we will continue with home dose oxycodone  to avoid withdrawal.  Blood pressures are improving though still orthostatic - Have advised patient to follow closely with her pain management team to consider further de-escalation especially of her opioid therapy. - Had extensive discussion with the patient as well as with her son regarding concerns of polypharmacy.  They endorsed understanding - PT/OT recommendations as above  UTI - UA with large leukocytes and bacteria.  Foul-smelling per RN - Has been initiated on ceftriaxone . - Culture growing Klebsiella and Proteus.  Follow for susceptibilities  Urinary retention - Failed straight cath attempts.  Foley catheter placed 7/12.  Underwent trial of void again on 7/13.  Foley catheter replaced today 7/14 -- Urology consulted and will see patient outpatient for TOV. 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  To be treated on outpatient basis.  Throat soreness - On exam she has dry mouth with some cobblestoning as evidence of postnasal drip - Have ordered for Flonase  and saline spray.  MRSA screen positive - No concern for active MRSA infection - Contact precautions per hospital policy - Consider decolonization at discharge  AKI Hypokalemia Hypomagnesemia - Baseline creatinine less than 1 - Creatinine on arrival 2.5, likely multifactorial prerenal from dehydration and ATN from hypotension - Creatinine continues to improve now with IV fluids - Continue electrolyte replacement as needed.  Daily CMP. - Continue to avoid nephrotoxic medications. - Hold Jardiance  - Reduced gabapentin  as above.  Avoid hypotension as above.  Suspected Malnutrition - Albumin 2.0 - Will consult RD  Parkinson's disease - Continue home dose carbidopa   levodopa .  Anxiety Depression - On multiple psychiatric meds.  See above. - Continue to Deprescribe where able.  See above.  Type 2 diabetes - Most recent hemoglobin A1c 6.0%.  Well-controlled. - At home on Jardiance   Rhabdomyolysis - Status post IV fluids - Discontinue CK trend  GERD - Continue PPI  Dyslipidemia - On statin.  Hold given current rhabdo  COPD, not currently in exacerbation - Continue home meds   DVT prophylaxis: Lovenox    Code Status: Full Code Disposition: If continues to improve will likely discharge home tomorrow with home health?  Consultants:    Procedures:    Antimicrobials:  Anti-infectives (From admission, onward)    Start     Dose/Rate Route Frequency Ordered Stop   08/31/23 2200  cefTRIAXone  (ROCEPHIN ) 1 g in sodium chloride  0.9 % 100 mL IVPB        1 g 200 mL/hr over 30 Minutes Intravenous Every 24 hours 08/31/23 2041         Data Reviewed: I have personally reviewed following labs and imaging studies CBC: Recent Labs  Lab 08/29/23 0225 08/30/23 0820 08/31/23 0908 09/01/23 0234 09/02/23 0434  WBC 7.6 8.2 10.3 8.2 6.7  NEUTROABS  --  6.8 8.5* 6.3 5.1  HGB 9.7* 10.2* 9.3* 8.9* 9.2*  HCT 28.0* 27.7* 26.8* 25.6* 26.7*  MCV 89.5 87.7 87.9 88.6 89.9  PLT 190 201 243 231 227   Basic Metabolic Panel: Recent Labs  Lab 08/29/23 0225 08/30/23 0820  08/31/23 0908 09/01/23 0234 09/02/23 0434  NA 131* 134* 132* 132* 134*  K 3.1* 2.9* 4.2 3.8 3.6  CL 99 105 104 106 105  CO2 21* 20* 20* 18* 22  GLUCOSE 101* 105* 134* 115* 188*  BUN 30* 24* 20 18 15   CREATININE 2.36* 1.61* 1.55* 1.26* 1.29*  CALCIUM  7.1* 7.4* 7.6* 7.4* 7.9*  MG  --   --   --  1.5* 2.3  PHOS  --   --   --  2.6 3.1   GFR: Estimated Creatinine Clearance: 45.6 mL/min (A) (by C-G formula based on SCr of 1.29 mg/dL (H)). Liver Function Tests: Recent Labs  Lab 08/30/23 0820 08/31/23 0908 09/01/23 0234 09/02/23 0434  AST 54* 58* 52* 61*  ALT <5 7 <5 6   ALKPHOS 134* 138* 138* 134*  BILITOT 2.3* 2.1* 1.6* 1.3*  PROT 4.4* 4.6* 4.9* 5.0*  ALBUMIN 1.9* 2.0* 2.0* 2.0*   CBG: No results for input(s): GLUCAP in the last 168 hours.  Recent Results (from the past 240 hours)  MRSA Next Gen by PCR, Nasal     Status: Abnormal   Collection Time: 08/29/23  5:20 PM   Specimen: Nasal Mucosa; Nasal Swab  Result Value Ref Range Status   MRSA by PCR Next Gen DETECTED (A) NOT DETECTED Final    Comment: RESULT CALLED TO, READ BACK BY AND VERIFIED WITH: BYNUM, JOYYA D., LPN @2044  08/30/2023 COP (NOTE) The GeneXpert MRSA Assay (FDA approved for NASAL specimens only), is one component of a comprehensive MRSA colonization surveillance program. It is not intended to diagnose MRSA infection nor to guide or monitor treatment for MRSA infections. Test performance is not FDA approved in patients less than 83 years old. Performed at [facility], [address] [place], [place] [address]   Urine Culture (for pregnant, neutropenic or urologic patients or patients with an indwelling urinary catheter)     Status: Abnormal (Preliminary result)   Collection Time: 08/31/23  9:08 AM   Specimen: Urine, Catheterized  Result Value Ref Range Status   Specimen Description   Final    URINE, CATHETERIZED Performed at [facility], [address] [place], [place] [address]    Special Requests   Final    NONE Performed at [facility], [address] [place], [place] [address]    Culture (A)  Final    >=100,000 COLONIES/mL KLEBSIELLA PNEUMONIAE 10,000 COLONIES/mL PROTEUS MIRABILIS SUSCEPTIBILITIES TO FOLLOW Performed at [facility], [address], [place], [place] [address]    Report Status PENDING  Incomplete  Gastrointestinal Panel by PCR , Stool     Status: None   Collection Time: 09/01/23 12:50 AM   Specimen: Stool  Result Value Ref Range Status   Campylobacter species NOT DETECTED NOT DETECTED Final   Plesimonas  shigelloides NOT DETECTED NOT DETECTED Final   Salmonella species NOT DETECTED NOT DETECTED Final   Yersinia enterocolitica NOT DETECTED NOT DETECTED Final   Vibrio species NOT DETECTED NOT DETECTED Final   Vibrio cholerae NOT DETECTED NOT DETECTED Final   Enteroaggregative E coli (EAEC) NOT DETECTED NOT DETECTED Final   Enteropathogenic E coli (EPEC) NOT DETECTED NOT DETECTED Final   Enterotoxigenic E coli (ETEC) NOT DETECTED NOT DETECTED Final   Shiga like toxin producing E coli (STEC) NOT DETECTED NOT DETECTED Final   Shigella/Enteroinvasive E coli (EIEC) NOT DETECTED NOT DETECTED Final   Cryptosporidium NOT DETECTED NOT DETECTED Final   Cyclospora cayetanensis NOT DETECTED  NOT DETECTED Final   Entamoeba histolytica NOT DETECTED NOT DETECTED Final   Giardia lamblia NOT DETECTED NOT DETECTED Final   Adenovirus F40/41 NOT DETECTED NOT DETECTED Final   Astrovirus NOT DETECTED NOT DETECTED Final   Norovirus GI/GII NOT DETECTED NOT DETECTED Final   Rotavirus A NOT DETECTED NOT DETECTED Final   Sapovirus (I, II, IV, and V) NOT DETECTED NOT DETECTED Final    Comment: Performed at [facility], [address], [place], [place] [address]     Radiology Studies: No results found.   Scheduled Meds:  busPIRone   7.5 mg Oral BID   carbidopa -levodopa   1 tablet Oral QHS   carbidopa -levodopa   1.5 tablet Oral TID   Chlorhexidine  Gluconate Cloth  6 each Topical Daily   colchicine   0.6 mg Oral Daily   cyclobenzaprine   10 mg Oral QHS   DULoxetine   60 mg Oral Daily   enoxaparin  (LOVENOX ) injection  40 mg Subcutaneous Q24H   feeding supplement  237 mL Oral TID BM   fiber supplement (BANATROL TF)  60 mL Oral BID   fluticasone   2 spray Each Nare Daily   gabapentin   400 mg Oral TID   ipratropium  2 spray Each Nare QID   montelukast   10 mg Oral QHS   multivitamin with minerals  1 tablet Oral Daily   mupirocin  ointment  1 Application Nasal BID   oxyCODONE   15 mg Oral Q12H   pantoprazole    40 mg Oral Daily   QUEtiapine   25 mg Oral QHS   sodium chloride   1 spray Each Nare Q4H   theophylline   400 mg Oral Daily   umeclidinium bromide   1 puff Inhalation Daily   Continuous Infusions:  cefTRIAXone  (ROCEPHIN )  IV 1 g (09/01/23 2142)      LOS: 4 days  MDM: Patient is high risk for one or more organ failure.  They necessitate ongoing hospitalization for continued IV therapies and subsequent lab monitoring. Total time spent interpreting labs and vitals, reviewing the medical record, coordinating care amongst consultants and care team members, directly assessing and discussing care with the patient and/or family: 55 min  Nhia Heaphy, DO [facility]  To contact the attending physician between 7A-7P please use Epic Chat. To contact the covering physician during after hours 7P-7A, please review Amion.  09/02/2023, 2:53 PM   *This document has been created with the assistance of dictation software. Please excuse typographical errors. *

## 2023-09-02 NOTE — Progress Notes (Unsigned)
 {  Select_TRH_Note:[id number]}

## 2023-09-03 DIAGNOSIS — M6282 Rhabdomyolysis: Secondary | ICD-10-CM | POA: Diagnosis not present

## 2023-09-03 DIAGNOSIS — N189 Chronic kidney disease, unspecified: Secondary | ICD-10-CM | POA: Diagnosis not present

## 2023-09-03 DIAGNOSIS — R55 Syncope and collapse: Secondary | ICD-10-CM | POA: Diagnosis not present

## 2023-09-03 DIAGNOSIS — F4329 Adjustment disorder with other symptoms: Secondary | ICD-10-CM | POA: Diagnosis not present

## 2023-09-03 DIAGNOSIS — N179 Acute kidney failure, unspecified: Secondary | ICD-10-CM | POA: Diagnosis not present

## 2023-09-03 LAB — PHOSPHORUS: Phosphorus: 2.3 mg/dL — ABNORMAL LOW (ref 2.5–4.6)

## 2023-09-03 LAB — CBC WITH DIFFERENTIAL/PLATELET
Abs Immature Granulocytes: 0.48 K/uL — ABNORMAL HIGH (ref 0.00–0.07)
Basophils Absolute: 0.1 K/uL (ref 0.0–0.1)
Basophils Relative: 1 %
Eosinophils Absolute: 0 K/uL (ref 0.0–0.5)
Eosinophils Relative: 0 %
HCT: 27.9 % — ABNORMAL LOW (ref 36.0–46.0)
Hemoglobin: 9.8 g/dL — ABNORMAL LOW (ref 12.0–15.0)
Immature Granulocytes: 6 %
Lymphocytes Relative: 21 %
Lymphs Abs: 1.7 K/uL (ref 0.7–4.0)
MCH: 31.6 pg (ref 26.0–34.0)
MCHC: 35.1 g/dL (ref 30.0–36.0)
MCV: 90 fL (ref 80.0–100.0)
Monocytes Absolute: 0.5 K/uL (ref 0.1–1.0)
Monocytes Relative: 6 %
Neutro Abs: 5.1 K/uL (ref 1.7–7.7)
Neutrophils Relative %: 66 %
Platelets: 232 K/uL (ref 150–400)
RBC: 3.1 MIL/uL — ABNORMAL LOW (ref 3.87–5.11)
RDW: 15.3 % (ref 11.5–15.5)
Smear Review: NORMAL
WBC: 7.8 K/uL (ref 4.0–10.5)
nRBC: 0.3 % — ABNORMAL HIGH (ref 0.0–0.2)

## 2023-09-03 LAB — COMPREHENSIVE METABOLIC PANEL WITH GFR
ALT: 6 U/L (ref 0–44)
AST: 86 U/L — ABNORMAL HIGH (ref 15–41)
Albumin: 1.8 g/dL — ABNORMAL LOW (ref 3.5–5.0)
Alkaline Phosphatase: 138 U/L — ABNORMAL HIGH (ref 38–126)
Anion gap: 6 (ref 5–15)
BUN: 13 mg/dL (ref 8–23)
CO2: 23 mmol/L (ref 22–32)
Calcium: 7.3 mg/dL — ABNORMAL LOW (ref 8.9–10.3)
Chloride: 105 mmol/L (ref 98–111)
Creatinine, Ser: 1.32 mg/dL — ABNORMAL HIGH (ref 0.44–1.00)
GFR, Estimated: 46 mL/min — ABNORMAL LOW (ref >60)
Glucose, Bld: 129 mg/dL — ABNORMAL HIGH (ref 70–99)
Potassium: 3.1 mmol/L — ABNORMAL LOW (ref 3.5–5.1)
Sodium: 134 mmol/L — ABNORMAL LOW (ref 135–145)
Total Bilirubin: 1.3 mg/dL — ABNORMAL HIGH (ref 0.0–1.2)
Total Protein: 4.6 g/dL — ABNORMAL LOW (ref 6.5–8.1)

## 2023-09-03 LAB — URINE CULTURE: Culture: >=100000 — AB

## 2023-09-03 LAB — MAGNESIUM: Magnesium: 1.8 mg/dL (ref 1.7–2.4)

## 2023-09-03 MED ORDER — CEFADROXIL 500 MG PO CAPS
500.0000 mg | ORAL_CAPSULE | Freq: Two times a day (BID) | ORAL | Status: AC
  Administered 2023-09-03 – 2023-09-04 (×4): 500 mg via ORAL
  Filled 2023-09-03 (×4): qty 1

## 2023-09-03 NOTE — Progress Notes (Addendum)
 Physical Therapy Treatment Patient Details Name: Tricia [name] MRN: [id number] DOB: Sep 30, 1960 Today's Date: 09/03/2023   History of Present Illness Pt is a 63 y.o. female admitted with acute kidney injury superimposed on CKD, UTI, orthostatic hypotension with syncope, and rhabdomyolysis.  PMHx includes: COPD, Asthma, osteoarthritis, anxiety, depression, TypeII DM, Diastolic Dysfunction, GERD, HTN, Dyslipidemia, Rhabdomyolitis 2/2 fall, Parkinson's Disease, and syncope.    PT Comments  Pt ready for session.  She is noted to have small mucous BM and care is provided.  Orthostatics taken and she does remain orthostatic and symptomatic.  C/o dizziness upon sitting that did not clear with time.  Stated she felt weak and unsafe to stand for standing BP's.  BP cuff was noted to be too big and was switched to a small cuff prior to readings.  Supine 110/60 P 94, sitting 82/57 P 105 and upon return to supine 121/69 P 98.  Documented in flow sheets also.  Communicated with staff.  DC recs updated to reflect safe discharge plan given new symptoms.   If plan is discharge home, recommend the following: Assistance with cooking/housework;Assist for transportation;Help with stairs or ramp for entrance;A lot of help with walking and/or transfers;A little help with bathing/dressing/bathroom   Can travel by private vehicle        Equipment Recommendations       Recommendations for Other Services       Precautions / Restrictions Precautions Precautions: Fall Recall of Precautions/Restrictions: Impaired Restrictions Other Position/Activity Restrictions: Orthostatic Hypotension     Mobility  Bed Mobility Overal bed mobility: Needs Assistance Bed Mobility: Supine to Sit, Sit to Supine     Supine to sit: Min assist, Mod assist Sit to supine: Min assist   General bed mobility comments: for rib/side pain discomfort Patient Response: Cooperative  Transfers                   General  transfer comment: deferred due to orthostasis    Ambulation/Gait               General Gait Details: usafe at this time   Optometrist     Tilt Bed Tilt Bed Patient Response: Cooperative  Modified Rankin (Stroke Patients Only)       Balance Overall balance assessment: Needs assistance Sitting-balance support: Feet supported Sitting balance-Leahy Scale: Good Sitting balance - Comments: supervision due to BP                                    Communication Communication Communication: No apparent difficulties  Cognition Arousal: Alert Behavior During Therapy: WFL for tasks assessed/performed   PT - Cognitive impairments: No apparent impairments                       PT - Cognition Comments: seemed intact today for limited session Following commands: Intact Following commands impaired: Only follows one step commands consistently, Follows one step commands with increased time    Cueing Cueing Techniques: Verbal cues, Tactile cues, Visual cues  Exercises      General Comments        Pertinent Vitals/Pain Pain Assessment Pain Assessment: Faces Faces Pain Scale: Hurts little more Pain Location: ribs/side Pain Descriptors / Indicators: Aching, Sore Pain Intervention(s): Limited activity within patient's tolerance, Monitored during session, Repositioned  Home Living                          Prior Function            PT Goals (current goals can now be found in the care plan section) Progress towards PT goals: Not progressing toward goals - comment    Frequency    Min 2X/week      PT Plan      Co-evaluation              AM-PAC PT 6 Clicks Mobility   Outcome Measure  Help needed turning from your back to your side while in a flat bed without using bedrails?: A Little Help needed moving from lying on your back to sitting on the side of a flat bed without using  bedrails?: A Little Help needed moving to and from a bed to a chair (including a wheelchair)?: A Little Help needed standing up from a chair using your arms (e.g., wheelchair or bedside chair)?: A Little Help needed to walk in hospital room?: A Lot Help needed climbing 3-5 steps with a railing? : A Lot 6 Click Score: 16    End of Session Equipment Utilized During Treatment: Gait belt Activity Tolerance: Patient tolerated treatment well Patient left: in bed;with call bell/phone within reach;with bed alarm set Nurse Communication: Mobility status;Other (comment) PT Visit Diagnosis: Muscle weakness (generalized) (M62.81);Difficulty in walking, not elsewhere classified (R26.2)     Time: 9078-9059 PT Time Calculation (min) (ACUTE ONLY): 19 min  Charges:    $Therapeutic Activity: 8-22 mins PT General Charges $$ ACUTE PT VISIT: 1 Visit                    Lauraine Gills, PTA 09/03/23, 9:46 AM

## 2023-09-03 NOTE — Progress Notes (Signed)
 PROGRESS NOTE    Tricia [name]  FMW:[id number] DOB: Sep 11, 1960 DOA: 08/28/2023 PCP: Sowles, Krichna, MD  Chief Complaint  Patient presents with   [facility] Course:  Tricia [name] is a 63 year old female with chronic pain, COPD, asthma, osteoarthritis, anxiety, depression, type 2 diabetes, diastolic CHF, GERD, hypertension, dyslipidemia, who presents to the ED with syncopal episode.  Patient cannot elaborate to me on what occurred prior to her admission.  She remembers sitting down at home and then woke up in the hospital. Initial vitals in the ED are 125/100, creatinine elevation of 2.55, CK6 59.  Noncon head CT without acute intracranial abnormalities.  Patient received 2 L NS and was admitted.  Gradually her mental status has improved but she remains intermittently altered.  We have significant concerns about her ability to take care of herself at home.  Subjective: This morning patient is calm and oriented. Her son is law is at baseline.  He endorses significant concerns about her going home.  In private conversation he reveals that her mental status appears to have been declining over the last few months.  He believes that she is not taking her medications consistently and at times is taking too many of the wrong pills.  He has had concerns about dementia.  I discussed recommendations of skilled nursing facility with the patient.  She is hesitant but ultimately amendable.  Objective: Vitals:   09/03/23 0145 09/03/23 0520 09/03/23 0547 09/03/23 1011  BP: 103/61  109/65 124/66  Pulse: 94  88 91  Resp: 17  18 20   Temp: 98.5 F (36.9 C)  98.5 F (36.9 C) 98.1 F (36.7 C)  TempSrc: Oral  Oral Oral  SpO2: 97% 97% 97% 95%  Weight:      Height:        Intake/Output Summary (Last 24 hours) at 09/03/2023 1403 Last data filed at 09/03/2023 1036 Gross per 24 hour  Intake 217.44 ml  Output 1350 ml  Net -1132.56 ml   Filed Weights   08/28/23 1256  Weight: 71.7  kg    Examination: General exam: Appears calm and comfortable, NAD  Respiratory system: No work of breathing, symmetric chest wall expansion Cardiovascular system: S1 & S2 heard, RRR.  Gastrointestinal system: Abdomen is nondistended, soft and nontender.  Neuro: Alert and oriented x4 today Extremities: Symmetric, expected ROM Skin: No rashes, lesions Psychiatry: Demonstrates appropriate judgement and insight. Mood & affect appropriate for situation.   Assessment & Plan:  Principal Problem:   Acute kidney injury superimposed on chronic kidney disease (HCC) Active Problems:   Syncope, vasovagal   Rhabdomyolysis   Dyslipidemia   GERD without esophagitis   Parkinson disease (HCC)   Anxiety and depression   Chronic obstructive pulmonary disease (COPD) (HCC)   Type 2 diabetes mellitus with stage 3 chronic kidney disease (HCC)   Malnutrition of moderate degree   Syncope - Likely multifactorial in setting of orthostatic hypotension, polypharmacy.  - Orthostatics positive. - Echocardiogram with preserved EF, grade 1 diastolic dysfunction.  Clinically euvolemic. - Rehydrated now - Continue monitor on telemetry.  No arrhythmias noted so far - PT/OT recommending SNF. TOC consulted - I have discussed these recommendations with her son, he has similar concerns and believes she would benefit better from rehab or skilled nursing facility.   Orthostatic hypotension - Status post fluid resuscitation.  Improving some but chronic - Can apply TED hose while standing but doubt patient will be able to do this at home. -  Fall precautions - Polypharmacy and opioid therapy playing a role.  Continued de-escalation as below.  Polypharmacy Chronic pain Opioid dependence - Patient is on multiple psychiatric medications, duplicate antihistamine therapy, as well as chronic opioid therapy - Home meds include: BuSpar , Cymbalta , Flexeril , Xyzal , gabapentin , morphine , MS Contin , Seroquel , hydroxyzine . -  Continue with daily medication de-escalation.  Thus far have discontinued hydroxyzine , decreased gabapentin , discontinued immediate release morphine . - For now we will continue with home dose oxycodone  to avoid withdrawal.  Blood pressures are improving though still orthostatic - Have advised patient to follow closely with her pain management team to consider further de-escalation especially of her opioid therapy. - Had extensive discussion with the patient as well as with her son regarding concerns of polypharmacy.  They endorsed understanding - PT/OT recommendations as above  Altered mentation - Patient's mentation waxes and wanes throughout this hospital stay.  Her son endorses some concerns of increasing forgetfulness and inability to take care of herself at home - Patient has intermittently experiencing some hallucination during this admission -Patient is oriented x 4 on basic questioning today but complex decision-making is questionable - Psychiatry has been consulted to assess patient's capacity appreciate recommendations.  Parkinson's disease - Continue home dose carbidopa  levodopa .  UTI - UA with large leukocytes and bacteria.   - Culture growing Klebsiella and Proteus.  Complete 5 days of antibiotics with duricef.  Urinary retention - Failed straight cath attempts.  Foley catheter placed 7/12.  Underwent trial of void again on 7/13.  Foley catheter replaced 7/14 -- Urology consulted and will see patient outpatient for TOV. 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  To be treated on outpatient basis.  Throat soreness - On exam she has dry mouth with some cobblestoning as evidence of postnasal drip - Have ordered for Flonase  and saline spray.  MRSA screen positive - No concern for active MRSA infection - Contact precautions per hospital policy - Consider decolonization at discharge  AKI Hypokalemia Hypomagnesemia - Baseline creatinine less than 1 - Creatinine on  arrival 2.5, likely multifactorial prerenal from dehydration and ATN from hypotension - Creatinine continues to improve now with IV fluids - Continue electrolyte replacement as needed.  Daily CMP. - Continue to avoid nephrotoxic medications. - Hold Jardiance  - Reduced gabapentin  as above.  Avoid hypotension as above.  Suspected Malnutrition - Albumin 2.0 - Will consult RD  Anxiety Depression - On multiple psychiatric meds.  See above. - Continue to Deprescribe where able.  See above.  Type 2 diabetes - Most recent hemoglobin A1c 6.0%.  Well-controlled. - At home on Jardiance   Rhabdomyolysis - Status post IV fluids - Discontinue CK trend  GERD - Continue PPI  Dyslipidemia - On statin.  Hold given current rhabdo  COPD, not currently in exacerbation - Continue home meds   DVT prophylaxis: Lovenox    Code Status: Full Code Disposition: Medically cleared, pending Psych eval and SNF placement  Consultants:  Treatment Team:  Consulting Physician: Donnelly Mellow, MD  Procedures:    Antimicrobials:  Anti-infectives (From admission, onward)    Start     Dose/Rate Route Frequency Ordered Stop   09/03/23 1100  cefadroxil  (DURICEF) capsule 500 mg        500 mg Oral 2 times daily 09/03/23 1007 09/05/23 0959   08/31/23 2200  cefTRIAXone  (ROCEPHIN ) 1 g in sodium chloride  0.9 % 100 mL IVPB  Status:  Discontinued        1 g 200 mL/hr over 30 Minutes Intravenous Every  24 hours 08/31/23 2041 09/03/23 1007       Data Reviewed: I have personally reviewed following labs and imaging studies CBC: Recent Labs  Lab 08/30/23 0820 08/31/23 0908 09/01/23 0234 09/02/23 0434 09/03/23 0506  WBC 8.2 10.3 8.2 6.7 7.8  NEUTROABS 6.8 8.5* 6.3 5.1 5.1  HGB 10.2* 9.3* 8.9* 9.2* 9.8*  HCT 27.7* 26.8* 25.6* 26.7* 27.9*  MCV 87.7 87.9 88.6 89.9 90.0  PLT 201 243 231 227 232   Basic Metabolic Panel: Recent Labs  Lab 08/30/23 0820 08/31/23 0908 09/01/23 0234 09/02/23 0434  09/03/23 0506  NA 134* 132* 132* 134* 134*  K 2.9* 4.2 3.8 3.6 3.1*  CL 105 104 106 105 105  CO2 20* 20* 18* 22 23  GLUCOSE 105* 134* 115* 188* 129*  BUN 24* 20 18 15 13   CREATININE 1.61* 1.55* 1.26* 1.29* 1.32*  CALCIUM  7.4* 7.6* 7.4* 7.9* 7.3*  MG  --   --  1.5* 2.3 1.8  PHOS  --   --  2.6 3.1 2.3*   GFR: Estimated Creatinine Clearance: 44.6 mL/min (A) (by C-G formula based on SCr of 1.32 mg/dL (H)). Liver Function Tests: Recent Labs  Lab 08/30/23 0820 08/31/23 0908 09/01/23 0234 09/02/23 0434 09/03/23 0506  AST 54* 58* 52* 61* 86*  ALT <5 7 5 6 6   ALKPHOS 134* 138* 138* 134* 138*  BILITOT 2.3* 2.1* 1.6* 1.3* 1.3*  PROT 4.4* 4.6* 4.9* 5.0* 4.6*  ALBUMIN 1.9* 2.0* 2.0* 2.0* 1.8*   CBG: No results for input(s): GLUCAP in the last 168 hours.  Recent Results (from the past 240 hours)  MRSA Next Gen by PCR, Nasal     Status: Abnormal   Collection Time: 08/29/23  5:20 PM   Specimen: Nasal Mucosa; Nasal Swab  Result Value Ref Range Status   MRSA by PCR Next Gen DETECTED (A) NOT DETECTED Final    Comment: RESULT CALLED TO, READ BACK BY AND VERIFIED WITH: BYNUM, JOYYA D., LPN @2044  08/30/2023 COP (NOTE) The GeneXpert MRSA Assay (FDA approved for NASAL specimens only), is one component of a comprehensive MRSA colonization surveillance program. It is not intended to diagnose MRSA infection nor to guide or monitor treatment for MRSA infections. Test performance is not FDA approved in patients less than 7 years old. Performed at [facility], [address], [place], [place] [address]   Urine Culture (for pregnant, neutropenic or urologic patients or patients with an indwelling urinary catheter)     Status: Abnormal   Collection Time: 08/31/23  9:08 AM   Specimen: Urine, Catheterized  Result Value Ref Range Status   Specimen Description   Final    URINE, CATHETERIZED Performed at [facility], [address], [place], [place] [address]    Special  Requests   Final    NONE Performed at [facility], [address] [place], [place] [address]    Culture (A)  Final    >=100,000 COLONIES/mL KLEBSIELLA PNEUMONIAE 10,000 COLONIES/mL PROTEUS MIRABILIS    Report Status 09/03/2023 FINAL  Final   Organism ID, Bacteria KLEBSIELLA PNEUMONIAE (A)  Final   Organism ID, Bacteria PROTEUS MIRABILIS (A)  Final      Susceptibility   Klebsiella pneumoniae - MIC*    AMPICILLIN RESISTANT Resistant     CEFAZOLIN <=4 SENSITIVE Sensitive     CEFEPIME  <=0.12 SENSITIVE Sensitive     CEFTRIAXONE  <=0.25 SENSITIVE Sensitive     CIPROFLOXACIN  <=0.25 SENSITIVE Sensitive     GENTAMICIN <=1  SENSITIVE Sensitive     IMIPENEM <=0.25 SENSITIVE Sensitive     NITROFURANTOIN  64 INTERMEDIATE Intermediate     TRIMETH /SULFA  <=20 SENSITIVE Sensitive     AMPICILLIN/SULBACTAM <=2 SENSITIVE Sensitive     PIP/TAZO <=4 SENSITIVE Sensitive ug/mL    * >=100,000 COLONIES/mL KLEBSIELLA PNEUMONIAE   Proteus mirabilis - MIC*    AMPICILLIN <=2 SENSITIVE Sensitive     CEFAZOLIN <=4 SENSITIVE Sensitive     CEFEPIME  <=0.12 SENSITIVE Sensitive     CEFTRIAXONE  <=0.25 SENSITIVE Sensitive     CIPROFLOXACIN  <=0.25 SENSITIVE Sensitive     GENTAMICIN <=1 SENSITIVE Sensitive     IMIPENEM 4 SENSITIVE Sensitive     NITROFURANTOIN  256 RESISTANT Resistant     TRIMETH /SULFA  <=20 SENSITIVE Sensitive     AMPICILLIN/SULBACTAM <=2 SENSITIVE Sensitive     PIP/TAZO <=4 SENSITIVE Sensitive ug/mL    * 10,000 COLONIES/mL PROTEUS MIRABILIS  Gastrointestinal Panel by PCR , Stool     Status: None   Collection Time: 09/01/23 12:50 AM   Specimen: Stool  Result Value Ref Range Status   Campylobacter species NOT DETECTED NOT DETECTED Final   Plesimonas shigelloides NOT DETECTED NOT DETECTED Final   Salmonella species NOT DETECTED NOT DETECTED Final   Yersinia enterocolitica NOT DETECTED NOT DETECTED Final   Vibrio species NOT DETECTED NOT DETECTED Final   Vibrio cholerae NOT DETECTED NOT  DETECTED Final   Enteroaggregative E coli (EAEC) NOT DETECTED NOT DETECTED Final   Enteropathogenic E coli (EPEC) NOT DETECTED NOT DETECTED Final   Enterotoxigenic E coli (ETEC) NOT DETECTED NOT DETECTED Final   Shiga like toxin producing E coli (STEC) NOT DETECTED NOT DETECTED Final   Shigella/Enteroinvasive E coli (EIEC) NOT DETECTED NOT DETECTED Final   Cryptosporidium NOT DETECTED NOT DETECTED Final   Cyclospora cayetanensis NOT DETECTED NOT DETECTED Final   Entamoeba histolytica NOT DETECTED NOT DETECTED Final   Giardia lamblia NOT DETECTED NOT DETECTED Final   Adenovirus F40/41 NOT DETECTED NOT DETECTED Final   Astrovirus NOT DETECTED NOT DETECTED Final   Norovirus GI/GII NOT DETECTED NOT DETECTED Final   Rotavirus A NOT DETECTED NOT DETECTED Final   Sapovirus (I, II, IV, and V) NOT DETECTED NOT DETECTED Final    Comment: Performed at [facility], [address] [place], [place] [address]     Radiology Studies: No results found.   Scheduled Meds:  busPIRone   7.5 mg Oral BID   carbidopa -levodopa   1 tablet Oral QHS   carbidopa -levodopa   1.5 tablet Oral TID   cefadroxil   500 mg Oral BID   Chlorhexidine  Gluconate Cloth  6 each Topical Daily   colchicine   0.6 mg Oral Daily   cyclobenzaprine   10 mg Oral QHS   DULoxetine   60 mg Oral Daily   enoxaparin  (LOVENOX ) injection  40 mg Subcutaneous Q24H   feeding supplement  237 mL Oral TID BM   fiber supplement (BANATROL TF)  60 mL Oral BID   fluticasone   2 spray Each Nare Daily   gabapentin   400 mg Oral TID   ipratropium  2 spray Each Nare QID   montelukast   10 mg Oral QHS   multivitamin with minerals  1 tablet Oral Daily   mupirocin  ointment  1 Application Nasal BID   oxyCODONE   15 mg Oral Q12H   pantoprazole   40 mg Oral Daily   QUEtiapine   25 mg Oral QHS   sodium chloride   1 spray Each Nare Q4H   theophylline   400 mg Oral Daily  umeclidinium bromide   1 puff Inhalation Daily   Continuous Infusions:      LOS:  5 days  MDM: Patient is high risk for one or more organ failure.  They necessitate ongoing hospitalization for continued IV therapies and subsequent lab monitoring. Total time spent interpreting labs and vitals, reviewing the medical record, coordinating care amongst consultants and care team members, directly assessing and discussing care with the patient and/or family: 55 min  Madicyn Mesina, DO [facility]  To contact the attending physician between 7A-7P please use Epic Chat. To contact the covering physician during after hours 7P-7A, please review Amion.  09/03/2023, 2:03 PM   *This document has been created with the assistance of dictation software. Please excuse typographical errors. *

## 2023-09-03 NOTE — Plan of Care (Signed)

## 2023-09-03 NOTE — Consult Note (Signed)
 [facility] Psychiatric Consult Initial  Patient Name: .HARRY BARK  MRN: [id number]  DOB: [birth year]  Consult Order details:  Orders (From admission, onward)     Start     Ordered   09/03/23 0828  IP CONSULT TO PSYCHIATRY       Ordering Provider: Leesa Kast, DO  Provider:  Donnelly Mellow, MD  Question Answer Comment  Location [facility]   Reason for Consult? capacity eval regarding ability to take care of themselves at home      09/03/23 0828             Mode of Visit: In person    Psychiatry Consult Evaluation  Service Date: September 03, 2023 LOS:  LOS: 5 days  Chief Complaint They tell I fell  Primary Psychiatric Diagnoses  Adjustment disoder with emotional disturbance   Assessment  SIANNI CLONINGER is a 63 y.o. female admitted: MedicallyElizabeth L Campanella is a 63 year old female with chronic pain, COPD, asthma, osteoarthritis, anxiety, depression, type 2 diabetes, diastolic CHF, GERD, hypertension, dyslipidemia, who presents to the ED with syncopal episode.  Patient patient has no memory of the recent events that led up to the hospitalization initial vitals in the ED are 125/100, creatinine elevation of 2.55, CK6 59.  Noncon head CT without acute intracranial abnormalities.  Patient received 2 L NS and was admitted.  Gradually her mental status has improved but she remains intermittently altered.  Psychiatry was consulted as primary team had significant concerns about her ability to take care of herself at home.  On assessment patient is awake, alert, oriented to self, month and the year and location.  She is unable to recall the events that led up to her hospitalization.  She is able to acknowledge having difficulty getting out of the bed but is not able to appreciate the intensity of the physical limitation and continues to state wanting to go home with home health.  She acknowledge that there is family at home but also informs  that her family is not home most of the day as they go to work.  She is not able to rationalize and think about the help she needs when there is nobody around her at home.  Patient is unable to express her appreciation or understanding of the gravity of her current physical situation and the need for close monitoring like rehab or SNF.  Patient does not have capacity to make medical decisions regarding going home with home health at this time.  Capacity evaluation can be temporary and is not a competency evaluation.  Capacity can fluctuate and can improve with patient's improvement in clinical status.  Diagnoses:  Active Hospital problems: Principal Problem:   Acute kidney injury superimposed on chronic kidney disease (HCC) Active Problems:   Dyslipidemia   Syncope, vasovagal   Parkinson disease (HCC)   Anxiety and depression   Chronic obstructive pulmonary disease (COPD) (HCC)   GERD without esophagitis   Rhabdomyolysis   Type 2 diabetes mellitus with stage 3 chronic kidney disease (HCC)   Malnutrition of moderate degree    Plan   ## Psychiatric Medication Recommendations:  Duloxetine  60 mg daily; seroquel  25 mg qhs  ## Medical Decision Making Capacity: Patient is able to express her wish of wanting to go home but is unable to rationalize the pros and cons, risk versus benefits of going home given her physical instability.  She is able to acknowledge that she is unable to do her ADLs in  the hospital but demands to be discharged home with home health in spite of the fact that her family members were up most of the day and are not home to help her.  She is unable to recall any recent events that led up to her hospitalization, she looks medically fragile and is unable to express her appreciation of her next steps if she does not get any help at home and unable to express her appreciation of her physical limitation currently.  Patient lacks capacity to make the medical decision of wanting to go  home with home health at this time.  Will recommend TOC consult and recommend next of kin to be involved in decision making.  ## Further Work-up:   -- most recent EKG on 08/29/2023 had QtC of 457 -- Pertinent labwork reviewed earlier this admission includes: CBC, CMP, urinalysis   ## Disposition:-- There are no psychiatric contraindications to discharge at this time  ## Behavioral / Environmental: -Delirium Precautions: Delirium Interventions for Nursing and Staff: - RN to open blinds every AM. - To Bedside: Glasses, hearing aide, and pt's own shoes. Make available to patients. when possible and encourage use. - Encourage po fluids when appropriate, keep fluids within reach. - OOB to chair with meals. - Passive ROM exercises to all extremities with AM & PM care. - RN to assess orientation to person, time and place QAM and PRN. - Recommend extended visitation hours with familiar family/friends as feasible. - Staff to minimize disturbances at night. Turn off television when pt asleep or when not in use.    ## Safety and Observation Level:  - Based on my clinical evaluation, I estimate the patient to be at low risk of self harm in the current setting. - At this time, we recommend  routine. This decision is based on my review of the chart including patient's history and current presentation, interview of the patient, mental status examination, and consideration of suicide risk including evaluating suicidal ideation, plan, intent, suicidal or self-harm behaviors, risk factors, and protective factors. This judgment is based on our ability to directly address suicide risk, implement suicide prevention strategies, and develop a safety plan while the patient is in the clinical setting. Please contact our team if there is a concern that risk level has changed.  CSSR Risk Category:C-SSRS RISK CATEGORY: No Risk  Suicide Risk Assessment: Patient has following modifiable risk factors for suicide: None  identified at this time which  Patient has following non-modifiable or demographic risk factors for suicide: early widowhood Patient has the following protective factors against suicide: Supportive family and Cultural, spiritual, or religious beliefs that discourage suicide  Thank you for this consult request. Recommendations have been communicated to the primary team.  We will sign off at this time.   Lener Ventresca, MD       History of Present Illness  Relevant Aspects of [facility]   Patient Report:  Today on interview patient is noted to be resting in bed.  She was calm and cooperative and engaged in interview.  She was able to answer some of the orientation questions including the month as July and the year is 2025, location as [facility] in [place].  When asked about the events that happened that led up to her hospitalization she reports not able to remember.  She did inform that she was told by the doctors that she collapsed or fell at home.  She reports that she was disabled.  Before this incident she was  able to remember that she was taking care of herself like cooking herself taking care of her ADLs including bath and driving.  She reports that when her partner died she moved in with her daughter and son-in-law.  She reports that 2023-12-08her daughter died of overdose and since then she has been living with the son-in-law who is a Naval architect and gone for most of the days.  She does talk about having a granddaughter at home but not involved in her life.  She reports feeling sad about losing her daughter and reports that no parent can ever be done grieving the loss of the children.  She denies feeling hopeless or worthless, denies anhedonia.  She reports wanting to get better and wanting to go home soon.  She denies SI/HI/plan.  She denies having any panic attack but certainly reports anxiety and being on Cymbalta .  She denies auditory/visual hallucinations she denies recent or previous  episodes of mania/hypomania    Collateral information:  Santina to voicemail of son Ozell [phone]    Psychiatric and Social History  Psychiatric History:  Information collected from patient and chart  Prev Dx/Sx: Anxiety/depression Current Psych Provider: Unknown Home Meds (current): Seroquel  and Cymbalta  Previous Med Trials: Unknown Therapy: Unknown  Prior Psych Hospitalization: None reported Prior Self Harm: None reported Prior Violence: None reported  Family Psych History: Mom with anxiety Family Hx suicide: Denies  Social History:   Educational Hx: Academic librarian Hx: On SSI Legal Hx: Denies Living Situation: With son-in-law Spiritual Hx: Unknown Access to weapons/lethal means: Denies  Substance History Alcohol: Denies Tobacco: 1 pack/day Illicit drugs: Denies Prescription drug abuse: Denies Rehab hx: Denies  Exam Findings  Physical Exam: Reviewed and agree with the physical exam findings conducted by the medical provider Vital Signs:  Temp:  [98.1 F (36.7 C)-98.8 F (37.1 C)] 98.1 F (36.7 C) (07/15 1011) Pulse Rate:  [88-113] 91 (07/15 1011) Resp:  [16-20] 20 (07/15 1011) BP: (90-124)/(59-77) 124/66 (07/15 1011) SpO2:  [89 %-99 %] 95 % (07/15 1011) Blood pressure 124/66, pulse 91, temperature 98.1 F (36.7 C), temperature source Oral, resp. rate 20, height 5' 8 (1.727 m), weight 71.7 kg, SpO2 95%. Body mass index is 24.02 kg/m.    Mental Status Exam: General Appearance: Casual  Orientation:  Full (Time, Place, and Person)  Memory:  Immediate;   Poor Recent;   Poor Remote;   Fair  Concentration:  Concentration: Fair and Attention Span: Fair  Recall:  Poor  Attention  Fair  Eye Contact:  Fair  Speech:  Normal Rate  Language:  Fair  Volume:  Normal  Mood: depressed  Affect:  Congruent  Thought Process:  Coherent  Thought Content:  Illogical  Suicidal Thoughts:  No  Homicidal Thoughts:  No  Judgement:  Impaired  Insight:   Shallow  Psychomotor Activity:  Decreased  Akathisia:  No  Fund of Knowledge:  Fair      Assets:  Communication Skills Desire for Improvement  Cognition:  Impaired,  Mild  ADL's:  Impaired  AIMS (if indicated):        Other History   These have been pulled in through the EMR, reviewed, and updated if appropriate.  Family History:  [name] family history includes Anxiety disorder in her mother; Bipolar disorder in her daughter and daughter; Cervical cancer in her daughter; Diabetes in her daughter; Drug abuse in her daughter; Emphysema in her mother; Hypertension in her daughter; Lung cancer in her maternal aunt,  maternal grandfather, maternal grandmother, and maternal uncle; Multiple sclerosis in her daughter; Stroke in her father; Throat cancer in her father.  Medical History: Past Medical History:  Diagnosis Date   Anxiety    Arthritis    joints and hands/ knees   Asthma    uses inhaler   Benign essential tremor    head   Cervical dystonia    neck pain   Cholesteatoma of left ear    x2   COPD (chronic obstructive pulmonary disease) (HCC)    Cough    Depression    Diabetes mellitus without complication (HCC)    type 2   Diastolic dysfunction    Dyspnea    Dysrhythmia    diastolic dysfunction   GERD (gastroesophageal reflux disease)    Headache    migraines/ one per week   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   Hyperlipidemia    Hypertension    Motion sickness    boat   Neuromuscular disorder (HCC)    neuropathy feet and hands( nerve damage)   Wears dentures    upper and lower    Surgical History: Past Surgical History:  Procedure Laterality Date   CARPAL TUNNEL RELEASE Bilateral    x2 right, 1x on left   COLONOSCOPY     COLONOSCOPY WITH PROPOFOL  N/A 04/25/2017   Procedure: COLONOSCOPY WITH PROPOFOL ;  Surgeon: Jinny Carmine, MD;  Location: [facility];  Service: Endoscopy;  Laterality: N/A;  diabetic-oral med   DILATION AND  CURETTAGE OF UTERUS     ESOPHAGOGASTRODUODENOSCOPY (EGD) WITH PROPOFOL  N/A 01/10/2021   Procedure: ESOPHAGOGASTRODUODENOSCOPY (EGD) WITH PROPOFOL ;  Surgeon: Janalyn Keene NOVAK, MD;  Location: [facility];  Service: Endoscopy;  Laterality: N/A;  Diabetic   EXTERNAL EAR SURGERY Left    x2   POLYPECTOMY  04/25/2017   Procedure: POLYPECTOMY INTESTINAL;  Surgeon: Jinny Carmine, MD;  Location: [facility];  Service: Endoscopy;;   SPINE SURGERY     herniated disc   TUBAL LIGATION       Medications:   Current Facility-Administered Medications:    acetaminophen  (TYLENOL ) tablet 650 mg, 650 mg, Oral, Q6H PRN **OR** acetaminophen  (TYLENOL ) suppository 650 mg, 650 mg, Rectal, Q6H PRN, Mansy, Jan A, MD   busPIRone  (BUSPAR ) tablet 7.5 mg, 7.5 mg, Oral, BID, Mansy, Jan A, MD, 7.5 mg at 09/03/23 9144   carbidopa -levodopa  (SINEMET  CR) 50-200 MG per tablet controlled release 1 tablet, 1 tablet, Oral, QHS, Mansy, Jan A, MD, 1 tablet at 09/02/23 2301   carbidopa -levodopa  (SINEMET  IR) 25-100 MG per tablet immediate release 1.5 tablet, 1.5 tablet, Oral, TID, Mansy, Jan A, MD, 1.5 tablet at 09/03/23 0854   cefadroxil  (DURICEF) capsule 500 mg, 500 mg, Oral, BID, Dezii, Alexandra, DO, 500 mg at 09/03/23 1122   Chlorhexidine  Gluconate Cloth 2 % PADS 6 each, 6 each, Topical, Daily, Dezii, Alexandra, DO, 6 each at 09/03/23 9147   colchicine  tablet 0.6 mg, 0.6 mg, Oral, Daily, Mansy, Jan A, MD, 0.6 mg at 09/03/23 0855   cyclobenzaprine  (FLEXERIL ) tablet 10 mg, 10 mg, Oral, QHS, Mansy, Jan A, MD, 10 mg at 09/02/23 2300   DULoxetine  (CYMBALTA ) DR capsule 60 mg, 60 mg, Oral, Daily, Mansy, Jan A, MD, 60 mg at 09/03/23 0854   enoxaparin  (LOVENOX ) injection 40 mg, 40 mg, Subcutaneous, Q24H, Merrill, Kristin A, RPH, 40 mg at 09/02/23 2259   feeding supplement (ENSURE PLUS HIGH PROTEIN) liquid 237 mL, 237 mL, Oral, TID BM, Dezii,  Alexandra, DO, 237 mL at 09/03/23 1253   fiber supplement (BANATROL TF) liquid 60  mL, 60 mL, Oral, BID, Dezii, Alexandra, DO, 60 mL at 09/03/23 9146   fluticasone  (FLONASE ) 50 MCG/ACT nasal spray 2 spray, 2 spray, Each Nare, Daily, Mansy, Jan A, MD, 2 spray at 09/03/23 9146   gabapentin  (NEURONTIN ) capsule 400 mg, 400 mg, Oral, TID, Dezii, Alexandra, DO, 400 mg at 09/03/23 9144   haloperidol  lactate (HALDOL ) injection 1-2 mg, 1-2 mg, Intramuscular, Q6H PRN, Mansy, Jan A, MD, 2 mg at 08/31/23 0335   ipratropium (ATROVENT ) 0.06 % nasal spray 2 spray, 2 spray, Each Nare, QID, Mansy, Jan A, MD, 2 spray at 09/03/23 1253   ipratropium-albuterol  (DUONEB) 0.5-2.5 (3) MG/3ML nebulizer solution 3 mL, 3 mL, Inhalation, Q6H PRN, Mansy, Jan A, MD, 3 mL at 09/03/23 0520   lidocaine  (LIDODERM ) 5 % 1 patch, 1 patch, Transdermal, Q12H PRN, Mansy, Jan A, MD   loperamide  (IMODIUM ) capsule 2 mg, 2 mg, Oral, PRN, Mansy, Jan A, MD, 2 mg at 09/02/23 0620   magnesium  hydroxide (MILK OF MAGNESIA) suspension 30 mL, 30 mL, Oral, Daily PRN, Mansy, Jan A, MD   montelukast  (SINGULAIR ) tablet 10 mg, 10 mg, Oral, QHS, Mansy, Jan A, MD, 10 mg at 09/02/23 2300   multivitamin with minerals tablet 1 tablet, 1 tablet, Oral, Daily, Dezii, Alexandra, DO, 1 tablet at 09/03/23 0855   mupirocin  ointment (BACTROBAN ) 2 % 1 Application, 1 Application, Nasal, BID, Dezii, Alexandra, DO, 1 Application at 09/03/23 0856   ondansetron  (ZOFRAN ) tablet 4 mg, 4 mg, Oral, Q6H PRN **OR** ondansetron  (ZOFRAN ) injection 4 mg, 4 mg, Intravenous, Q6H PRN, Mansy, Jan A, MD   oxyCODONE  (OXYCONTIN ) 12 hr tablet 15 mg, 15 mg, Oral, Q12H, Mansy, Jan A, MD, 15 mg at 09/03/23 0855   pantoprazole  (PROTONIX ) EC tablet 40 mg, 40 mg, Oral, Daily, Mansy, Jan A, MD, 40 mg at 09/03/23 0855   QUEtiapine  (SEROQUEL ) tablet 25 mg, 25 mg, Oral, QHS, Mansy, Jan A, MD, 25 mg at 09/02/23 2300   sodium chloride  (OCEAN) 0.65 % nasal spray 1 spray, 1 spray, Each Nare, Q4H, Dezii, Alexandra, DO, 1 spray at 09/03/23 1254   SUMAtriptan  (IMITREX ) tablet 100 mg, 100  mg, Oral, Q2H PRN, Dail Rankin RAMAN, RPH   theophylline  (UNIPHYL) 400 MG 24 hr tablet 400 mg, 400 mg, Oral, Daily, Mansy, Jan A, MD, 400 mg at 09/03/23 1121   traZODone  (DESYREL ) tablet 25 mg, 25 mg, Oral, QHS PRN, Mansy, Jan A, MD, 25 mg at 09/01/23 2148   umeclidinium bromide  (INCRUSE ELLIPTA ) 62.5 MCG/ACT 1 puff, 1 puff, Inhalation, Daily, Dail Rankin RAMAN, RPH, 1 puff at 09/03/23 0857  Allergies: Allergies  Allergen Reactions   Augmentin [Amoxicillin-Pot Clavulanate] Diarrhea   Penicillins Itching    Calvary Difranco, MD

## 2023-09-04 DIAGNOSIS — N189 Chronic kidney disease, unspecified: Secondary | ICD-10-CM | POA: Diagnosis not present

## 2023-09-04 DIAGNOSIS — N179 Acute kidney failure, unspecified: Secondary | ICD-10-CM | POA: Diagnosis not present

## 2023-09-04 LAB — COMPREHENSIVE METABOLIC PANEL WITH GFR
ALT: <5 U/L (ref 0–44)
AST: 88 U/L — ABNORMAL HIGH (ref 15–41)
Albumin: 1.8 g/dL — ABNORMAL LOW (ref 3.5–5.0)
Alkaline Phosphatase: 140 U/L — ABNORMAL HIGH (ref 38–126)
Anion gap: 4 — ABNORMAL LOW (ref 5–15)
BUN: 12 mg/dL (ref 8–23)
CO2: 24 mmol/L (ref 22–32)
Calcium: 7.3 mg/dL — ABNORMAL LOW (ref 8.9–10.3)
Chloride: 107 mmol/L (ref 98–111)
Creatinine, Ser: 1.16 mg/dL — ABNORMAL HIGH (ref 0.44–1.00)
GFR, Estimated: 53 mL/min — ABNORMAL LOW (ref >60)
Glucose, Bld: 127 mg/dL — ABNORMAL HIGH (ref 70–99)
Potassium: 3.1 mmol/L — ABNORMAL LOW (ref 3.5–5.1)
Sodium: 135 mmol/L (ref 135–145)
Total Bilirubin: 1.1 mg/dL (ref 0.0–1.2)
Total Protein: 4.6 g/dL — ABNORMAL LOW (ref 6.5–8.1)

## 2023-09-04 LAB — CBC WITH DIFFERENTIAL/PLATELET
Abs Immature Granulocytes: 0.32 K/uL — ABNORMAL HIGH (ref 0.00–0.07)
Basophils Absolute: 0.1 K/uL (ref 0.0–0.1)
Basophils Relative: 1 %
Eosinophils Absolute: 0.1 K/uL (ref 0.0–0.5)
Eosinophils Relative: 1 %
HCT: 28.3 % — ABNORMAL LOW (ref 36.0–46.0)
Hemoglobin: 9.8 g/dL — ABNORMAL LOW (ref 12.0–15.0)
Immature Granulocytes: 4 %
Lymphocytes Relative: 20 %
Lymphs Abs: 1.7 K/uL (ref 0.7–4.0)
MCH: 30.8 pg (ref 26.0–34.0)
MCHC: 34.6 g/dL (ref 30.0–36.0)
MCV: 89 fL (ref 80.0–100.0)
Monocytes Absolute: 0.5 K/uL (ref 0.1–1.0)
Monocytes Relative: 6 %
Neutro Abs: 6.1 K/uL (ref 1.7–7.7)
Neutrophils Relative %: 68 %
Platelets: 235 K/uL (ref 150–400)
RBC: 3.18 MIL/uL — ABNORMAL LOW (ref 3.87–5.11)
RDW: 15.4 % (ref 11.5–15.5)
WBC: 8.8 K/uL (ref 4.0–10.5)
nRBC: 0 % (ref 0.0–0.2)

## 2023-09-04 LAB — PHOSPHORUS: Phosphorus: 2.6 mg/dL (ref 2.5–4.6)

## 2023-09-04 LAB — MAGNESIUM: Magnesium: 1.7 mg/dL (ref 1.7–2.4)

## 2023-09-04 MED ORDER — POTASSIUM CHLORIDE CRYS ER 20 MEQ PO TBCR
40.0000 meq | EXTENDED_RELEASE_TABLET | ORAL | Status: AC
  Administered 2023-09-04 (×2): 40 meq via ORAL
  Filled 2023-09-04 (×2): qty 2

## 2023-09-04 MED ORDER — MIDODRINE HCL 5 MG PO TABS
5.0000 mg | ORAL_TABLET | Freq: Three times a day (TID) | ORAL | Status: DC
  Administered 2023-09-04 – 2023-09-06 (×6): 5 mg via ORAL
  Filled 2023-09-04 (×6): qty 1

## 2023-09-04 NOTE — Progress Notes (Signed)
 Pt with foley and bladder scans ordered. Pt with significant amt in bladder even with foley. Foley patent and flushed with lil return. J mansy notified and orders to replace foley. Foley replaced with no urine output. 500 on bladder scan still. Pt even stood up to help with gravity with no return. Flushed with no resistance.

## 2023-09-04 NOTE — Progress Notes (Signed)
 Occupational Therapy Treatment Patient Details Name: Tricia [name] MRN: [id number] DOB: 1960-11-30 Today's Date: 09/04/2023   History of present illness Pt is a 63 y.o. female admitted with acute kidney injury superimposed on CKD, UTI, orthostatic hypotension with syncope, and rhabdomyolysis.  PMHx includes: COPD, Asthma, osteoarthritis, anxiety, depression, TypeII DM, Diastolic Dysfunction, GERD, HTN, Dyslipidemia, Rhabdomyolitis 2/2 fall, Parkinson's Disease, and syncope.   OT comments  Pt seen for OT tx this date. Pt asleep but awakens to voice. Pt is able to express need for pericare after incontinent BM in bed. Pt puts forth good efforts to roll R<>L, MIN A for bed mobility for trunk support, MAX A for pericare, pt can reach for bed rail and is able to lift BLE against gravity. Pt declining further OOB mobility, closing eyes and returning to sleep after care completed. NT assists with linen change throughout. Pt with bilateral TED hose donned, and per chart review, was still positive for orthostatic hypotension when assessed earlier thsi AM. OT will continue to follow and progress mobility as able. Patient will benefit from continued inpatient follow up therapy, <3 hours/day       If plan is discharge home, recommend the following:  A little help with walking and/or transfers;A lot of help with bathing/dressing/bathroom;Direct supervision/assist for medications management;Supervision due to cognitive status;Direct supervision/assist for financial management;Assistance with cooking/housework;Assist for transportation;Help with stairs or ramp for entrance   Equipment Recommendations  Other (comment)       Precautions / Restrictions Precautions Precautions: Fall Recall of Precautions/Restrictions: Impaired Restrictions Weight Bearing Restrictions Per Provider Order: No Other Position/Activity Restrictions: Orthostatic Hypotension       Mobility Bed Mobility Overal bed  mobility: Needs Assistance Bed Mobility: Rolling Rolling: Min assist         General bed mobility comments: rolling for pericare R<>L, repositioned in side lying for pressure relief    Transfers                         Balance Overall balance assessment: Needs assistance     Sitting balance - Comments: NT, bed level with incontinent BM and pt declining further OOB mobility                                   ADL either performed or assessed with clinical judgement   ADL Overall ADL's : Needs assistance/impaired                             Toileting- Clothing Manipulation and Hygiene: Maximal assistance;Bed level Toileting - Clothing Manipulation Details (indicate cue type and reason): maxA after incontinent BM bed level, pt with good efforts to roll and move BLE for pericare             Communication Communication Communication: No apparent difficulties   Cognition Arousal: Alert Behavior During Therapy: WFL for tasks assessed/performed Cognition: No family/caregiver present to determine baseline             OT - Cognition Comments: Pt oriented to self only                 Following commands: Intact Following commands impaired: Only follows one step commands consistently, Follows one step commands with increased time      Cueing   Cueing Techniques: Verbal cues  Pertinent Vitals/ Pain       Pain Assessment Pain Assessment: Faces Faces Pain Scale: Hurts a little bit Pain Location: ribs/side Pain Descriptors / Indicators: Aching, Sore Pain Intervention(s): Limited activity within patient's tolerance, Monitored during session, Repositioned   Frequency  Min 2X/week        Progress Toward Goals  OT Goals(current goals can now be found in the care plan section)  Progress towards OT goals: Progressing toward goals  Acute Rehab OT Goals OT Goal Formulation: With patient Time For Goal  Achievement: 09/14/23 Potential to Achieve Goals: Good ADL Goals Pt Will Perform Grooming: with supervision Pt Will Perform Upper Body Bathing: Independently Pt Will Perform Lower Body Dressing: with supervision Pt Will Transfer to Toilet: with supervision  Plan         AM-PAC OT 6 Clicks Daily Activity     Outcome Measure   Help from another person eating meals?: None Help from another person taking care of personal grooming?: A Lot Help from another person toileting, which includes using toliet, bedpan, or urinal?: A Lot Help from another person bathing (including washing, rinsing, drying)?: A Lot Help from another person to put on and taking off regular upper body clothing?: A Little Help from another person to put on and taking off regular lower body clothing?: A Lot 6 Click Score: 15    End of Session    OT Visit Diagnosis: Unsteadiness on feet (R26.81);Muscle weakness (generalized) (M62.81);History of falling (Z91.81)   Activity Tolerance Patient limited by fatigue;Patient limited by lethargy   Patient Left in bed;with call bell/phone within reach;with bed alarm set   Nurse Communication Mobility status        Time: 8952-8895 OT Time Calculation (min): 17 min  Charges: OT General Charges $OT Visit: 1 Visit OT Treatments $Self Care/Home Management : 8-22 mins  Cydney Alvarenga L. Emelina Hinch, OTR/L  09/04/23, 11:44 AM

## 2023-09-04 NOTE — Plan of Care (Signed)

## 2023-09-04 NOTE — Progress Notes (Addendum)
 Progress Note    Tricia [name]  FMW:[id number] DOB: 03/25/1960  DOA: 08/28/2023 PCP: Sowles, Krichna, MD      Brief Narrative:    Medical records reviewed and are as summarized below:  Tricia [name] Bunker is a 63 y.o. female with chronic pain, COPD, asthma, osteoarthritis, anxiety, depression, type 2 diabetes, diastolic CHF, GERD, hypertension, dyslipidemia, who presents to the ED with syncopal episode.  Initial vitals in the ED are 125/100, creatinine elevation of 2.55, CK  659.  Noncon head CT without acute intracranial abnormalities.  Patient received 2 L NS and was admitted.  Gradually her mental status has improved but she remains intermittently altered.  We have significant concerns about her ability to take care of herself at home.       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   S/p syncope: There is probably from orthostatic hypotension. S/p treatment with IV fluids. 2D echo showed preserved EF, grade 1 diastolic dysfunction.   Recurrent orthostatic hypotension: This may be chronic.  Risk factors for this include polypharmacy, diabetes mellitus and Parkinson's disease. Patient could not do much with PT because of orthostatic hypotension and dizziness.  Will consider starting midodrine    Polypharmacy, chronic pain, opioid dependence: - Patient  is on multiple psychiatric medications, duplicate antihistamine therapy, as well as chronic opioid therapy - Home meds include: BuSpar , Cymbalta , Flexeril , Xyzal , gabapentin , morphine , MS Contin , Seroquel , hydroxyzine . - Hydroxyzine  and immediate release morphine  have already been discontinued.    - Continue OxyContin  for now.   Outpatient follow-up with pain clinic for de-escalation of opioids strongly recommended.   Urinary retention: Foley catheter placed after multiple In-N-Out ureteral catheterization.  Failed voiding trial on 09/01/2023.  She was evaluated by the urologist on 09/02/2023.  Foley catheter has been replaced.  It was recommended that patient be discharged on Foley catheter.  Outpatient follow-up with urologist for voiding trial.   Acute UTI: S/p treatment with IV ceftriaxone  followed by IV cefadroxil . Urine culture from 08/31/2023 showed Klebsiella pneumoniae and Proteus mirabilis.   AKI, probable CKD stage IIIa: Creatinine is stable. Elevated CK: Does not meet criteria for rhabdomyolysis.  CK improved from 659-547. Hypokalemia: Replete potassium and monitor levels Hypomagnesemia: Improved   Comorbidities include COPD, Parkinson's disease on carbidopa -levodopa , anxiety, depression, type II DM, GERD, moderate malnutrition, hypoalbuminemia    Diet Order             DIET DYS 3 Fluid consistency: Thin  Diet effective now                            Consultants: Psychiatrist Urologist  Procedures: None    Medications:    busPIRone   7.5 mg Oral BID   carbidopa -levodopa   1 tablet Oral QHS   carbidopa -levodopa   1.5 tablet Oral TID   cefadroxil   500 mg Oral BID   Chlorhexidine  Gluconate  Cloth  6 each Topical Daily   colchicine   0.6 mg Oral Daily   cyclobenzaprine   10 mg Oral QHS   DULoxetine   60 mg Oral Daily   enoxaparin  (LOVENOX ) injection  40 mg Subcutaneous Q24H   feeding supplement  237 mL Oral TID BM   fiber supplement (BANATROL TF)  60 mL  Oral BID   fluticasone   2 spray Each Nare Daily   gabapentin   400 mg Oral TID   ipratropium  2 spray Each Nare QID   midodrine   5 mg Oral TID WC   montelukast   10 mg Oral QHS   multivitamin with minerals  1 tablet Oral Daily   mupirocin  ointment  1 Application Nasal BID   oxyCODONE   15 mg Oral Q12H   pantoprazole   40 mg Oral Daily   QUEtiapine   25 mg Oral QHS   sodium chloride   1 spray Each Nare Q4H   theophylline   400 mg Oral Daily   umeclidinium bromide   1 puff Inhalation Daily   Continuous Infusions:   Anti-infectives (From admission, onward)    Start     Dose/Rate Route Frequency Ordered Stop   09/03/23 1100  cefadroxil  (DURICEF) capsule 500 mg        500 mg Oral 2 times daily 09/03/23 1007 09/05/23 0959   08/31/23 2200  cefTRIAXone  (ROCEPHIN ) 1 g in sodium chloride  0.9 % 100 mL IVPB  Status:  Discontinued        1 g 200 mL/hr over 30 Minutes Intravenous Every 24 hours 08/31/23 2041 09/03/23 1007              Family Communication/Anticipated D/C date and plan/Code Status   DVT prophylaxis: Place TED hose Start: 09/02/23 1524 enoxaparin  (LOVENOX ) injection 40 mg Start: 08/30/23 2200     Code Status: Full Code  Family Communication: None Disposition Plan: Plan discharge to SNF   Status is: Inpatient Remains inpatient appropriate because: Awaiting placement to SNF       Subjective:   Interval events noted.  No complaints.  Objective:    Vitals:   09/03/23 2036 09/04/23 0400 09/04/23 0748 09/04/23 1632  BP:  118/67 112/63 (!) 103/59  Pulse: 100 95 94   Resp:  16 18 16   Temp:  99 F (37.2 C) 98.3 F (36.8 C) 98.7 F (37.1 C)  TempSrc:  Oral  Oral  SpO2: 92% 93% 95% 93%  Weight:      Height:       Orthostatic VS for the past 24 hrs:  BP- Lying Pulse- Lying BP- Sitting Pulse- Sitting BP- Standing at 0 minutes Pulse- Standing at 0 minutes  09/04/23 0750 112/63 94 92/66 102 (!) 80/46 113  09/03/23 2030 -- -- -- -- (!) 68/46 118  09/03/23  2028 -- -- (!) 86/61 108 -- --  09/03/23 2025 117/66 103 -- -- -- --     Intake/Output Summary (Last 24 hours) at 09/04/2023 1759 Last data filed at 09/04/2023 1436 Gross per 24 hour  Intake 120 ml  Output 925 ml  Net -805 ml   Filed Weights   08/28/23 1256  Weight: 71.7 kg    Exam:  GEN: NAD SKIN: Warm and dry EYES: No pallor or icterus ENT: MMM CV: RRR PULM: CTA B ABD: soft, ND, NT, +BS CNS: AAO x 3, non focal EXT: No edema or tenderness        Data Reviewed:   I have personally reviewed following labs and imaging studies:  Labs: Labs show the following:   Basic Metabolic Panel: Recent Labs  Lab 08/31/23 0908 09/01/23 0234 09/02/23 0434 09/03/23 0506 09/04/23 0504  NA 132* 132* 134* 134* 135  K 4.2 3.8 3.6 3.1* 3.1*  CL 104 106 105 105 107  CO2 20* 18* 22 23 24   GLUCOSE 134* 115* 188* 129* 127*  BUN 20 18 15 13 12   CREATININE 1.55* 1.26* 1.29* 1.32* 1.16*  CALCIUM  7.6* 7.4* 7.9* 7.3* 7.3*  MG  --  1.5* 2.3 1.8 1.7  PHOS  --  2.6 3.1 2.3* 2.6   GFR Estimated Creatinine Clearance: 50.7 mL/min (A) (by C-G formula based on SCr of 1.16 mg/dL (H)). Liver Function Tests: Recent Labs  Lab 08/31/23 0908 09/01/23 0234 09/02/23 0434 09/03/23 0506 09/04/23 0504  AST 58* 52* 61* 86* 88*  ALT 7 5 6 6  <5  ALKPHOS 138* 138* 134* 138* 140*  BILITOT 2.1* 1.6* 1.3* 1.3* 1.1  PROT 4.6* 4.9* 5.0* 4.6* 4.6*  ALBUMIN 2.0* 2.0* 2.0* 1.8* 1.8*   No results for input(s): LIPASE, AMYLASE in the last 168 hours. No results for input(s): AMMONIA in the last 168 hours. Coagulation profile No results for input(s): INR, PROTIME in the last 168 hours.  CBC: Recent Labs  Lab 08/31/23 0908 09/01/23 0234 09/02/23 0434 09/03/23 0506 09/04/23 0504  WBC 10.3 8.2 6.7 7.8 8.8  NEUTROABS 8.5* 6.3 5.1 5.1 6.1  HGB 9.3* 8.9* 9.2* 9.8* 9.8*  HCT 26.8* 25.6* 26.7* 27.9* 28.3*  MCV 87.9 88.6 89.9 90.0 89.0  PLT 243 231 227 232 235   Cardiac Enzymes: Recent  Labs  Lab 08/29/23 0223  CKTOTAL 547*   BNP (last 3 results) No results for input(s): PROBNP in the last 8760 hours. CBG: No results for input(s): GLUCAP in the last 168 hours. D-Dimer: No results for input(s): DDIMER in the last 72 hours. Hgb A1c: No results for input(s): HGBA1C in the last 72 hours. Lipid Profile: No results for input(s): CHOL, HDL, LDLCALC, TRIG, CHOLHDL, LDLDIRECT in the last 72 hours. Thyroid  function studies: No results for input(s): TSH, T4TOTAL, T3FREE, THYROIDAB in the last 72 hours.  Invalid input(s): FREET3 Anemia work up: No results for input(s): VITAMINB12, FOLATE, FERRITIN, TIBC, IRON, RETICCTPCT in the last 72 hours. Sepsis Labs: Recent Labs  Lab 09/01/23 0234 09/02/23 0434 09/03/23 0506 09/04/23 0504  WBC 8.2 6.7 7.8 8.8    Microbiology Recent Results (from the past 240 hours)  MRSA Next Gen by PCR, Nasal     Status: Abnormal   Collection Time: 08/29/23  5:20 PM   Specimen: Nasal Mucosa; Nasal Swab  Result Value Ref Range Status   MRSA by PCR Next Gen DETECTED (A) NOT DETECTED Final    Comment: RESULT CALLED TO, READ BACK BY AND VERIFIED WITH: BYNUM, JOYYA D., LPN @2044  08/30/2023 COP (NOTE) The GeneXpert MRSA Assay (FDA approved for NASAL specimens only), is one component of a comprehensive MRSA colonization surveillance program. It is not intended to diagnose MRSA infection nor to guide or monitor treatment for MRSA infections. Test performance is not FDA approved in patients less than 21 years old. Performed at [facility], [address] [place], [place] [address]   Urine Culture (for pregnant, neutropenic or urologic patients or patients with an indwelling urinary catheter)     Status: Abnormal   Collection Time: 08/31/23  9:08 AM   Specimen: Urine, Catheterized  Result Value Ref Range Status   Specimen Description   Final  URINE, CATHETERIZED Performed at [facility], [address] [place], [place] [address]    Special Requests   Final    NONE Performed at [facility], [address] [place], [place] [address]    Culture (A)  Final    >=100,000 COLONIES/mL KLEBSIELLA PNEUMONIAE 10,000 COLONIES/mL PROTEUS MIRABILIS    Report Status 09/03/2023 FINAL  Final   Organism ID, Bacteria KLEBSIELLA PNEUMONIAE (A)  Final   Organism ID, Bacteria PROTEUS MIRABILIS (A)  Final      Susceptibility   Klebsiella pneumoniae - MIC*    AMPICILLIN RESISTANT Resistant     CEFAZOLIN <=4 SENSITIVE Sensitive     CEFEPIME  <=0.12 SENSITIVE Sensitive     CEFTRIAXONE  <=0.25 SENSITIVE Sensitive     CIPROFLOXACIN  <=0.25 SENSITIVE Sensitive     GENTAMICIN <=1 SENSITIVE Sensitive     IMIPENEM <=0.25 SENSITIVE Sensitive     NITROFURANTOIN  64 INTERMEDIATE Intermediate     TRIMETH /SULFA  <=20 SENSITIVE Sensitive     AMPICILLIN/SULBACTAM <=2 SENSITIVE Sensitive     PIP/TAZO <=4 SENSITIVE Sensitive ug/mL    * >=100,000 COLONIES/mL KLEBSIELLA PNEUMONIAE   Proteus mirabilis - MIC*    AMPICILLIN <=2 SENSITIVE Sensitive     CEFAZOLIN <=4 SENSITIVE Sensitive     CEFEPIME  <=0.12 SENSITIVE Sensitive     CEFTRIAXONE  <=0.25 SENSITIVE Sensitive     CIPROFLOXACIN  <=0.25 SENSITIVE Sensitive     GENTAMICIN <=1 SENSITIVE Sensitive     IMIPENEM 4 SENSITIVE Sensitive     NITROFURANTOIN  256 RESISTANT Resistant     TRIMETH /SULFA  <=20 SENSITIVE Sensitive     AMPICILLIN/SULBACTAM <=2 SENSITIVE Sensitive     PIP/TAZO <=4 SENSITIVE Sensitive ug/mL    * 10,000 COLONIES/mL PROTEUS MIRABILIS  Gastrointestinal Panel by PCR , Stool     Status: None   Collection Time: 09/01/23 12:50 AM   Specimen: Stool  Result Value Ref Range Status   Campylobacter species NOT DETECTED NOT DETECTED Final   Plesimonas shigelloides NOT DETECTED NOT DETECTED Final   Salmonella species NOT DETECTED NOT DETECTED Final   Yersinia enterocolitica NOT DETECTED NOT DETECTED Final   Vibrio species  NOT DETECTED NOT DETECTED Final   Vibrio cholerae NOT DETECTED NOT DETECTED Final   Enteroaggregative E coli (EAEC) NOT DETECTED NOT DETECTED Final   Enteropathogenic E coli (EPEC) NOT DETECTED NOT DETECTED Final   Enterotoxigenic E coli (ETEC) NOT DETECTED NOT DETECTED Final   Shiga like toxin producing E coli (STEC) NOT DETECTED NOT DETECTED Final   Shigella/Enteroinvasive E coli (EIEC) NOT DETECTED NOT DETECTED Final   Cryptosporidium NOT DETECTED NOT DETECTED Final   Cyclospora cayetanensis NOT DETECTED NOT DETECTED Final   Entamoeba histolytica NOT DETECTED NOT DETECTED Final   Giardia lamblia NOT DETECTED NOT DETECTED Final   Adenovirus F40/41 NOT DETECTED NOT DETECTED Final   Astrovirus NOT DETECTED NOT DETECTED Final   Norovirus GI/GII NOT DETECTED NOT DETECTED Final   Rotavirus A NOT DETECTED NOT DETECTED Final   Sapovirus (I, II, IV, and V) NOT DETECTED NOT DETECTED Final    Comment: Performed at [facility], [address] [place], [place] [address]    Procedures and diagnostic studies:  No results found.             LOS: 6 days   Shambria Camerer  [facility]   Pager on [email or web address]. If 7PM-7AM, please contact night-coverage at [email or web address]     09/04/2023, 5:59 PM

## 2023-09-04 NOTE — Progress Notes (Signed)
 Physical Therapy Treatment Patient Details Name: Tricia [name] MRN: [id number] DOB: 1960-03-18 Today's Date: 09/04/2023   History of Present Illness Pt is a 63 y.o. female admitted with acute kidney injury superimposed on CKD, UTI, orthostatic hypotension with syncope, and rhabdomyolysis.  PMHx includes: COPD, Asthma, osteoarthritis, anxiety, depression, TypeII DM, Diastolic Dysfunction, GERD, HTN, Dyslipidemia, Rhabdomyolitis 2/2 fall, Parkinson's Disease, and syncope.    PT Comments  Patient alert, agreeable to PT. Referenced low back pain during session but did not quantify. Pt is very motivated in order to strengthen to go home, but remained limited due to dizziness/lightheadedness and orthostatics. minA for bed mobility and to stand with RW, but unable to tolerate standing >15seconds due to symptoms. BP monitored throughout 100s-110s/60-70s in sitting and supine, not attempted in standing to allow session to focus on functional strengthening. Pt also able to perform pericare in sidelying, minA. The patient would benefit from further skilled PT intervention to continue to progress towards goals.     If plan is discharge home, recommend the following: Assistance with cooking/housework;Assist for transportation;Help with stairs or ramp for entrance;A lot of help with walking and/or transfers;A little help with bathing/dressing/bathroom   Can travel by private vehicle     No  Equipment Recommendations  Other (comment) (TBD)    Recommendations for Other Services       Precautions / Restrictions Precautions Precautions: Fall Recall of Precautions/Restrictions: Impaired Restrictions Weight Bearing Restrictions Per Provider Order: No Other Position/Activity Restrictions: Orthostatic Hypotension     Mobility  Bed Mobility Overal bed mobility: Needs Assistance Bed Mobility: Rolling Rolling: Min assist   Supine to sit: Min assist Sit to supine: Contact guard assist         Transfers Overall transfer level: Needs assistance   Transfers: Sit to/from Stand Sit to Stand: Min assist           General transfer comment: twice fromEOB    Ambulation/Gait Ambulation/Gait assistance: Min assist Gait Distance (Feet): 1 Feet Assistive device: Rolling walker (2 wheels)         General Gait Details: 1-2 steps towards HOB, unable to tolerate more due to dizziness   Stairs             Wheelchair Mobility     Tilt Bed    Modified Rankin (Stroke Patients Only)       Balance Overall balance assessment: Needs assistance Sitting-balance support: Feet supported Sitting balance-Leahy Scale: Good     Standing balance support: Bilateral upper extremity supported, During functional activity, Reliant on assistive device for balance Standing balance-Leahy Scale: Poor                              Communication Communication Communication: No apparent difficulties  Cognition Arousal: Alert Behavior During Therapy: WFL for tasks assessed/performed   PT - Cognitive impairments: No apparent impairments                                Cueing Cueing Techniques: Verbal cues  Exercises Other Exercises Other Exercises: SAQ x10, many rounds of heel/toe raises, at least 20 LAQ (to assist with blood flow in gravity dependent positions)    General Comments        Pertinent Vitals/Pain Pain Assessment Pain Assessment: Faces Faces Pain Scale: Hurts little more Pain Location: low back Pain Descriptors / Indicators: Aching Pain Intervention(s): Limited activity within  patient's tolerance, Monitored during session, Repositioned    Home Living                          Prior Function            PT Goals (current goals can now be found in the care plan section) Progress towards PT goals: Progressing toward goals    Frequency    Min 2X/week      PT Plan      Co-evaluation              AM-PAC  PT 6 Clicks Mobility   Outcome Measure  Help needed turning from your back to your side while in a flat bed without using bedrails?: A Little Help needed moving from lying on your back to sitting on the side of a flat bed without using bedrails?: A Little Help needed moving to and from a bed to a chair (including a wheelchair)?: A Little Help needed standing up from a chair using your arms (e.g., wheelchair or bedside chair)?: A Little Help needed to walk in hospital room?: A Lot Help needed climbing 3-5 steps with a railing? : A Lot 6 Click Score: 16    End of Session   Activity Tolerance: Patient tolerated treatment well (still limited by dizziness/lightheadedness) Patient left: in bed;with call bell/phone within reach;with bed alarm set Nurse Communication: Mobility status PT Visit Diagnosis: Muscle weakness (generalized) (M62.81);Difficulty in walking, not elsewhere classified (R26.2)     Time: 8581-8558 PT Time Calculation (min) (ACUTE ONLY): 23 min  Charges:    $Therapeutic Activity: 23-37 mins PT General Charges $$ ACUTE PT VISIT: 1 Visit                     Doyal Shams PT, DPT 3:18 PM,09/04/23

## 2023-09-05 DIAGNOSIS — N179 Acute kidney failure, unspecified: Secondary | ICD-10-CM | POA: Diagnosis not present

## 2023-09-05 DIAGNOSIS — N189 Chronic kidney disease, unspecified: Secondary | ICD-10-CM | POA: Diagnosis not present

## 2023-09-05 LAB — COMPREHENSIVE METABOLIC PANEL WITH GFR
ALT: 7 U/L (ref 0–44)
AST: 77 U/L — ABNORMAL HIGH (ref 15–41)
Albumin: 1.8 g/dL — ABNORMAL LOW (ref 3.5–5.0)
Alkaline Phosphatase: 144 U/L — ABNORMAL HIGH (ref 38–126)
Anion gap: 4 — ABNORMAL LOW (ref 5–15)
BUN: 10 mg/dL (ref 8–23)
CO2: 22 mmol/L (ref 22–32)
Calcium: 7.5 mg/dL — ABNORMAL LOW (ref 8.9–10.3)
Chloride: 109 mmol/L (ref 98–111)
Creatinine, Ser: 1.08 mg/dL — ABNORMAL HIGH (ref 0.44–1.00)
GFR, Estimated: 58 mL/min — ABNORMAL LOW (ref >60)
Glucose, Bld: 114 mg/dL — ABNORMAL HIGH (ref 70–99)
Potassium: 3.5 mmol/L (ref 3.5–5.1)
Sodium: 135 mmol/L (ref 135–145)
Total Bilirubin: 1.1 mg/dL (ref 0.0–1.2)
Total Protein: 5.1 g/dL — ABNORMAL LOW (ref 6.5–8.1)

## 2023-09-05 LAB — CBC WITH DIFFERENTIAL/PLATELET
Abs Immature Granulocytes: 0.27 K/uL — ABNORMAL HIGH (ref 0.00–0.07)
Basophils Absolute: 0.1 K/uL (ref 0.0–0.1)
Basophils Relative: 1 %
Eosinophils Absolute: 0.2 K/uL (ref 0.0–0.5)
Eosinophils Relative: 2 %
HCT: 28.2 % — ABNORMAL LOW (ref 36.0–46.0)
Hemoglobin: 9.7 g/dL — ABNORMAL LOW (ref 12.0–15.0)
Immature Granulocytes: 4 %
Lymphocytes Relative: 25 %
Lymphs Abs: 1.9 K/uL (ref 0.7–4.0)
MCH: 31.3 pg (ref 26.0–34.0)
MCHC: 34.4 g/dL (ref 30.0–36.0)
MCV: 91 fL (ref 80.0–100.0)
Monocytes Absolute: 0.4 K/uL (ref 0.1–1.0)
Monocytes Relative: 6 %
Neutro Abs: 4.8 K/uL (ref 1.7–7.7)
Neutrophils Relative %: 62 %
Platelets: 246 K/uL (ref 150–400)
RBC: 3.1 MIL/uL — ABNORMAL LOW (ref 3.87–5.11)
RDW: 16.1 % — ABNORMAL HIGH (ref 11.5–15.5)
WBC: 7.7 K/uL (ref 4.0–10.5)
nRBC: 0 % (ref 0.0–0.2)

## 2023-09-05 LAB — MAGNESIUM: Magnesium: 1.7 mg/dL (ref 1.7–2.4)

## 2023-09-05 LAB — PHOSPHORUS: Phosphorus: 2.2 mg/dL — ABNORMAL LOW (ref 2.5–4.6)

## 2023-09-05 MED ORDER — PROSOURCE PLUS PO LIQD
30.0000 mL | Freq: Two times a day (BID) | ORAL | Status: DC
  Administered 2023-09-05 – 2023-09-18 (×12): 30 mL via ORAL

## 2023-09-05 MED ORDER — K PHOS MONO-SOD PHOS DI & MONO 155-852-130 MG PO TABS
500.0000 mg | ORAL_TABLET | Freq: Three times a day (TID) | ORAL | Status: AC
  Administered 2023-09-05 (×3): 500 mg via ORAL
  Filled 2023-09-05 (×3): qty 2

## 2023-09-05 NOTE — Progress Notes (Signed)
 Nutrition Follow-up  DOCUMENTATION CODES:   Non-severe (moderate) malnutrition in context of chronic illness  INTERVENTION:   -Continue dysphagia 3 diet for ease of intake (pt with no teeth). -Continue Ensure Plus High Protein po TID, each supplement provides 350 kcal and 20 grams of protein  -Continue 60 ml Banatrol BID -Continue MVI with minerals daily -30 ml Prosource Plus BID, each supplement provides 100 kcals and 15 grams protein  NUTRITION DIAGNOSIS:   Moderate Malnutrition related to chronic illness (COPD, Parkinson's disease) as evidenced by mild fat depletion, moderate fat depletion, mild muscle depletion, moderate muscle depletion.  Ongoing  GOAL:   Patient will meet greater than or equal to 90% of their needs  Progressing   MONITOR:   PO intake, Supplement acceptance  REASON FOR ASSESSMENT:   Consult Assessment of nutrition requirement/status  ASSESSMENT:   [name] of COPD, DM, HTN, HLD, parkinson's diease, and GERD presented to ED after a fall at home, thought to be due to polypharmacy. Found to have AKI with dehydration.  Reviewed I/O's: +360 ml x 24 hours and +1.4 L since admission  UOP: 800 ml x 24 hours  Pt sleeping soundly at time of visit. She did not arouse to name being called. No family at bedside.    Pt remains on a dysphagia 3 diet. Pt with minimal oral intake. Noted meal completions 10-50%. Pt is drinking Ensure supplements.   No new wt since last visit.   Per psychiatry notes, pt does not have capacity to make the medical decisions of returning home. Per MD notes, plan for SNF placement.   Medications reviewed and include sinemet , phosphorus, and neurontin .   Labs reviewed: CBGS: 238.    Diet Order:   Diet Order             DIET DYS 3 Fluid consistency: Thin  Diet effective now                   EDUCATION NEEDS:   Education needs have been addressed  Skin:  Skin Assessment: Reviewed RN Assessment  Last BM:  09/05/23  (type 7)  Height:   Ht Readings from Last 1 Encounters:  08/28/23 5' 8 (1.727 m)    Weight:   Wt Readings from Last 1 Encounters:  08/28/23 71.7 kg    Ideal Body Weight:  63.6 kg  BMI:  Body mass index is 24.02 kg/m.  Estimated Nutritional Needs:   Kcal:  1900-2100  Protein:  100-115 grams  Fluid:  1.9-2.1 L    Margery ORN, RD, LDN, CDCES Registered Dietitian III Certified Diabetes Care and Education Specialist If unable to reach this RD, please use RD Inpatient group chat on secure chat between hours of 8am-4 pm daily

## 2023-09-05 NOTE — Progress Notes (Signed)
 Occupational Therapy Treatment Patient Details Name: Tricia [name] MRN: [id number] DOB: 1961/01/22 Today's Date: 09/05/2023   History of present illness Pt is a 63 y.o. female admitted with acute kidney injury superimposed on CKD, UTI, orthostatic hypotension with syncope, and rhabdomyolysis.  PMHx includes: COPD, Asthma, osteoarthritis, anxiety, depression, TypeII DM, Diastolic Dysfunction, GERD, HTN, Dyslipidemia, Rhabdomyolitis 2/2 fall, Parkinson's Disease, and syncope.   OT comments  Pt seen for OT treatment on this date. Upon arrival to room pt semi supine in bed, reporting she has another BM (NT in room to assist). Pt agreeable to tx. Pt requires MINA for hand placement on bed rails and verbal cues for rolling R/L side during MAXA bed level pericare. Pt endorses discomfort on her bottom, pt was educated on pressure relief with pillows and the need to switch position often. Pt verbally expresses her understanding of education. Pt making fair progress toward goals, will continue to follow POC. Discharge recommendation remains appropriate.        If plan is discharge home, recommend the following:  A little help with walking and/or transfers;A lot of help with bathing/dressing/bathroom;Direct supervision/assist for medications management;Supervision due to cognitive status;Direct supervision/assist for financial management;Assistance with cooking/housework;Assist for transportation;Help with stairs or ramp for entrance   Equipment Recommendations  Other (comment)    Recommendations for Other Services      Precautions / Restrictions Precautions Precautions: Fall Recall of Precautions/Restrictions: Impaired Restrictions Weight Bearing Restrictions Per Provider Order: No Other Position/Activity Restrictions: Orthostatic Hypotension       Mobility Bed Mobility Overal bed mobility: Needs Assistance Bed Mobility: Rolling Rolling: Min assist         General bed mobility  comments: MINA for handplacement and cues for sequening. Pt repositioned to sidelying for pressure relief with use of pillow on her R side.    Transfers                         Balance                                           ADL either performed or assessed with clinical judgement   ADL Overall ADL's : Needs assistance/impaired                             Toileting- Clothing Manipulation and Hygiene: Maximal assistance;Bed level Toileting - Clothing Manipulation Details (indicate cue type and reason): MAXA bed level       General ADL Comments: MAXA for pericare at bed level; pt able to assist in rolling R/L with use of bed rails    Extremity/Trunk Assessment              Vision       Perception     Praxis     Communication Communication Communication: No apparent difficulties   Cognition Arousal: Alert Behavior During Therapy: WFL for tasks assessed/performed Cognition: No family/caregiver present to determine baseline             OT - Cognition Comments: Seemly more alert today, not talking too much during session                 Following commands: Intact Following commands impaired: Only follows one step commands consistently, Follows one step commands with increased time  Cueing   Cueing Techniques: Verbal cues  Exercises Exercises: Other exercises Other Exercises Other Exercises: Edu: OOB mobility benefits, pressure relief needs and rotating schedule    Shoulder Instructions       General Comments Pt reports discomfort on her bottom, NT in room to assist with pericare at bed level.    Pertinent Vitals/ Pain       Pain Assessment Pain Assessment: Faces Faces Pain Scale: Hurts even more Pain Location: bottom and lower back Pain Descriptors / Indicators: Constant, Discomfort, Grimacing Pain Intervention(s): Repositioned, Monitored during session  Home Living                                           Prior Functioning/Environment              Frequency  Min 2X/week        Progress Toward Goals  OT Goals(current goals can now be found in the care plan section)  Progress towards OT goals: Progressing toward goals  Acute Rehab OT Goals OT Goal Formulation: With patient Time For Goal Achievement: 09/14/23 Potential to Achieve Goals: Good ADL Goals Pt Will Perform Grooming: with supervision Pt Will Perform Upper Body Bathing: Independently Pt Will Perform Lower Body Dressing: with supervision Pt Will Transfer to Toilet: with supervision  Plan      Co-evaluation                 AM-PAC OT 6 Clicks Daily Activity     Outcome Measure   Help from another person eating meals?: None Help from another person taking care of personal grooming?: A Little Help from another person toileting, which includes using toliet, bedpan, or urinal?: A Lot Help from another person bathing (including washing, rinsing, drying)?: A Lot Help from another person to put on and taking off regular upper body clothing?: A Little Help from another person to put on and taking off regular lower body clothing?: A Lot 6 Click Score: 16    End of Session    OT Visit Diagnosis: Unsteadiness on feet (R26.81);Muscle weakness (generalized) (M62.81);History of falling (Z91.81)   Activity Tolerance Patient tolerated treatment well   Patient Left in bed;with call bell/phone within reach;with bed alarm set;with nursing/sitter in room   Nurse Communication Mobility status        Time: 8643-8591 OT Time Calculation (min): 12 min  Charges: OT General Charges $OT Visit: 1 Visit OT Treatments $Self Care/Home Management : 8-22 mins  Larraine Colas M.S. OTR/L  09/05/23, 3:01 PM

## 2023-09-05 NOTE — Progress Notes (Signed)
 Phosphorus 2.2. MD notified.

## 2023-09-05 NOTE — Progress Notes (Addendum)
 Progress Note    Tricia [name]  FMW:[id number] DOB: 1960/03/29  DOA: 08/28/2023 PCP: Sowles, Krichna, MD      Brief Narrative:    Medical records reviewed and are as summarized below:  Tricia [name] is a 63 y.o. female with chronic pain, COPD, asthma, osteoarthritis, anxiety, depression, type 2 diabetes, diastolic CHF, GERD, hypertension, dyslipidemia, who presents to the ED with syncopal episode.  Initial vitals in the ED are 125/100, creatinine elevation of 2.55, CK  659.  Noncon head CT without acute intracranial abnormalities.  Patient received 2 L NS and was admitted.  Gradually her mental status has improved but she remains intermittently altered.  We have significant concerns about her ability to take care of herself at home.       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   S/p syncope: There is probably from orthostatic hypotension. S/p treatment with IV fluids. 2D echo showed preserved EF, grade 1 diastolic dysfunction.   Recurrent orthostatic hypotension: This may be chronic.  Risk factors for this include polypharmacy, diabetes mellitus and Parkinson's disease. Continue midodrine  and monitor orthostatic vital signs   Polypharmacy, chronic pain, opioid dependence: - Patient is on multiple psychiatric medications, duplicate  antihistamine therapy, as well as chronic opioid therapy - Home meds include: BuSpar , Cymbalta , Flexeril , Xyzal , gabapentin , morphine , MS Contin , Seroquel , hydroxyzine . - Hydroxyzine  and immediate release morphine  have already been discontinued.    - Continue OxyContin  for now.   Outpatient follow-up with pain clinic for de-escalation of opioids strongly recommended.   Urinary retention: Foley catheter placed after multiple In-N-Out ureteral catheterization.  Failed voiding trial on 09/01/2023.  She was evaluated by the urologist on 09/02/2023.  Foley catheter has been replaced.  It was recommended that patient be discharged on Foley catheter.  Outpatient follow-up with urologist for voiding trial.   Acute UTI: S/p treatment with IV ceftriaxone  followed by IV cefadroxil . Urine culture from 08/31/2023 showed Klebsiella pneumoniae and Proteus mirabilis.   AKI, probable CKD stage IIIa: Creatinine is stable. Elevated CK: Does not meet criteria for rhabdomyolysis.  CK improved from 659-547. Hypokalemia: Improved Hypomagnesemia: Improved Hypophosphatemia: Replete with potassium phosphate    Diarrhea: Imodium  as needed.  No indication for C. difficile testing at this time.   Comorbidities include COPD, Parkinson's disease on carbidopa -levodopa , anxiety, depression, type II DM, GERD, moderate malnutrition, hypoalbuminemia   General Weakness: PT and OT recommended discharge to SNF.  Follow-up with TOC to assist with disposition.    Diet Order             DIET DYS 3 Fluid consistency: Thin  Diet effective now                            Consultants: Psychiatrist Urologist  Procedures: None    Medications:    busPIRone   7.5 mg Oral BID   carbidopa -levodopa   1 tablet Oral QHS   carbidopa -levodopa   1.5 tablet Oral TID   Chlorhexidine  Gluconate Cloth  6 each Topical Daily   colchicine   0.6 mg Oral Daily   cyclobenzaprine   10 mg Oral QHS   DULoxetine   60 mg Oral Daily    enoxaparin  (LOVENOX ) injection  40 mg Subcutaneous Q24H   feeding supplement  237 mL Oral TID BM   fiber supplement (BANATROL TF)  60 mL Oral BID   fluticasone   2 spray Each Nare Daily   gabapentin   400 mg Oral TID   ipratropium  2 spray Each Nare QID   midodrine   5 mg Oral TID WC   montelukast   10 mg Oral QHS   multivitamin with minerals  1 tablet Oral Daily   oxyCODONE   15 mg Oral Q12H   pantoprazole   40 mg Oral Daily   phosphorus  500 mg Oral TID   QUEtiapine   25 mg Oral QHS   sodium chloride   1 spray Each Nare Q4H   theophylline   400 mg Oral Daily   umeclidinium bromide   1 puff Inhalation Daily   Continuous Infusions:   Anti-infectives (From admission, onward)    Start     Dose/Rate Route Frequency Ordered Stop   09/03/23 1100  cefadroxil  (DURICEF) capsule 500 mg        500 mg Oral 2 times daily 09/03/23 1007 09/04/23 2144   08/31/23 2200  cefTRIAXone  (ROCEPHIN ) 1 g in sodium chloride  0.9 % 100 mL IVPB  Status:  Discontinued        1 g 200 mL/hr over 30 Minutes Intravenous Every 24 hours 08/31/23 2041 09/03/23 1007              Family Communication/Anticipated D/C date and plan/Code Status   DVT prophylaxis: Place TED hose Start: 09/02/23 1524 enoxaparin  (LOVENOX ) injection 40 mg Start: 08/30/23 2200     Code Status: Full Code  Family Communication: None Disposition Plan: Plan discharge to SNF   Status is: Inpatient Remains inpatient appropriate because: Awaiting placement to SNF       Subjective:   Interval events noted.  She said she wants to go home today.  She said her son-in-law is going out of town today.  She wants to go home and take care of the dogs.  Objective:    Vitals:   09/04/23 2000 09/04/23 2204 09/05/23 0333 09/05/23 0752  BP: 117/68 112/67 118/67 134/69  Pulse: (!) 102 87 92 93  Resp: 16 16 16 16   Temp: 98.4 F (36.9 C) 98.2 F (36.8 C) 98.5 F (36.9 C) 98.5 F (36.9 C)  TempSrc: Oral Oral Oral   SpO2:  96% 94% 97%   Weight:      Height:       Orthostatic VS for the past 24 hrs:  BP- Lying Pulse- Lying BP- Sitting Pulse- Sitting BP- Standing at 0 minutes Pulse- Standing at 0 minutes  09/05/23 0752 134/69 93 105/65 98 (!) 78/52 108  09/04/23 2209 -- -- -- -- 96/58 108  09/04/23 2207 -- -- 113/71 102 -- --  09/04/23 2204 112/67 87 -- -- -- --     Intake/Output Summary (Last 24 hours) at 09/05/2023 1039 Last data filed at 09/05/2023 0919 Gross per 24 hour  Intake 360 ml  Output 800 ml  Net -440 ml   Filed Weights   08/28/23 1256  Weight: 71.7 kg    Exam:   GEN: NAD SKIN: Warm and dry EYES: Anicteric ENT:  MMM CV: RRR PULM: CTA B ABD: soft, ND, NT, +BS CNS: AAO x 3, non focal EXT: No edema or tenderness       Data Reviewed:   I have personally reviewed following labs and imaging studies:  Labs: Labs show the following:   Basic Metabolic Panel: Recent Labs  Lab 09/01/23 0234 09/02/23 0434 09/03/23 0506 09/04/23 0504 09/05/23 0440  NA 132* 134* 134* 135 135  K 3.8 3.6 3.1* 3.1* 3.5  CL 106 105 105 107 109  CO2 18* 22 23 24 22   GLUCOSE 115* 188* 129* 127* 114*  BUN 18 15 13 12 10   CREATININE 1.26* 1.29* 1.32* 1.16* 1.08*  CALCIUM  7.4* 7.9* 7.3* 7.3* 7.5*  MG 1.5* 2.3 1.8 1.7 1.7  PHOS 2.6 3.1 2.3* 2.6 2.2*   GFR Estimated Creatinine Clearance: 54.5 mL/min (A) (by C-G formula based on SCr of 1.08 mg/dL (H)). Liver Function Tests: Recent Labs  Lab 09/01/23 0234 09/02/23 0434 09/03/23 0506 09/04/23 0504 09/05/23 0440  AST 52* 61* 86* 88* 77*  ALT 5 6 6  <5 7  ALKPHOS 138* 134* 138* 140* 144*  BILITOT 1.6* 1.3* 1.3* 1.1 1.1  PROT 4.9* 5.0* 4.6* 4.6* 5.1*  ALBUMIN 2.0* 2.0* 1.8* 1.8* 1.8*   No results for input(s): LIPASE, AMYLASE in the last 168 hours. No results for input(s): AMMONIA in the last 168 hours. Coagulation profile No results for input(s): INR, PROTIME in the last 168 hours.  CBC: Recent Labs  Lab 09/01/23 0234 09/02/23 0434  09/03/23 0506 09/04/23 0504 09/05/23 0440  WBC 8.2 6.7 7.8 8.8 7.7  NEUTROABS 6.3 5.1 5.1 6.1 4.8  HGB 8.9* 9.2* 9.8* 9.8* 9.7*  HCT 25.6* 26.7* 27.9* 28.3* 28.2*  MCV 88.6 89.9 90.0 89.0 91.0  PLT 231 227 232 235 246   Cardiac Enzymes: No results for input(s): CKTOTAL, CKMB, CKMBINDEX, TROPONINI in the last 168 hours.  BNP (last 3 results) No results for input(s): PROBNP in the last 8760 hours. CBG: No results for input(s): GLUCAP in the last 168 hours. D-Dimer: No results for input(s): DDIMER in the last 72 hours. Hgb A1c: No results for input(s): HGBA1C in the last 72 hours. Lipid Profile: No results for input(s): CHOL, HDL, LDLCALC, TRIG, CHOLHDL, LDLDIRECT in the last 72 hours. Thyroid  function studies: No results for input(s): TSH, T4TOTAL, T3FREE, THYROIDAB in the last 72 hours.  Invalid input(s): FREET3 Anemia work up: No results for input(s): VITAMINB12, FOLATE, FERRITIN, TIBC, IRON, RETICCTPCT in the last 72 hours. Sepsis Labs: Recent Labs  Lab 09/02/23 0434 09/03/23 0506 09/04/23 0504 09/05/23 0440  WBC 6.7 7.8 8.8 7.7    Microbiology Recent Results (from the past 240 hours)  MRSA Next Gen by PCR, Nasal     Status: Abnormal   Collection Time: 08/29/23  5:20 PM   Specimen: Nasal Mucosa; Nasal Swab  Result Value Ref Range Status   MRSA by PCR Next Gen DETECTED (A) NOT DETECTED Final    Comment: RESULT CALLED TO, READ BACK BY AND VERIFIED WITH: BYNUM, JOYYA D., LPN @2044  08/30/2023 COP (NOTE) The GeneXpert MRSA Assay (FDA approved for NASAL specimens only), is one component of a comprehensive MRSA colonization surveillance program. It is not intended to diagnose MRSA infection nor to guide or monitor treatment for MRSA infections. Test performance is not FDA approved in patients less than 70 years old. Performed at [facility], [address], [place], [place] [address]   Urine Culture (for  pregnant, neutropenic or urologic  patients or patients with an indwelling urinary catheter)     Status: Abnormal   Collection Time: 08/31/23  9:08 AM   Specimen: Urine, Catheterized  Result Value Ref Range Status   Specimen Description   Final    URINE, CATHETERIZED Performed at [facility], [address] [place], [place] [address]    Special Requests   Final    NONE Performed at [facility], [address] [place], [place] [address]    Culture (A)  Final    >=100,000 COLONIES/mL KLEBSIELLA PNEUMONIAE 10,000 COLONIES/mL PROTEUS MIRABILIS    Report Status 09/03/2023 FINAL  Final   Organism ID, Bacteria KLEBSIELLA PNEUMONIAE (A)  Final   Organism ID, Bacteria PROTEUS MIRABILIS (A)  Final      Susceptibility   Klebsiella pneumoniae - MIC*    AMPICILLIN RESISTANT Resistant     CEFAZOLIN <=4 SENSITIVE Sensitive     CEFEPIME  <=0.12 SENSITIVE Sensitive     CEFTRIAXONE  <=0.25 SENSITIVE Sensitive     CIPROFLOXACIN  <=0.25 SENSITIVE Sensitive     GENTAMICIN <=1 SENSITIVE Sensitive     IMIPENEM <=0.25 SENSITIVE Sensitive     NITROFURANTOIN  64 INTERMEDIATE Intermediate     TRIMETH /SULFA  <=20 SENSITIVE Sensitive     AMPICILLIN/SULBACTAM <=2 SENSITIVE Sensitive     PIP/TAZO <=4 SENSITIVE Sensitive ug/mL    * >=100,000 COLONIES/mL KLEBSIELLA PNEUMONIAE   Proteus mirabilis - MIC*    AMPICILLIN <=2 SENSITIVE Sensitive     CEFAZOLIN <=4 SENSITIVE Sensitive     CEFEPIME  <=0.12 SENSITIVE Sensitive     CEFTRIAXONE  <=0.25 SENSITIVE Sensitive     CIPROFLOXACIN  <=0.25 SENSITIVE Sensitive     GENTAMICIN <=1 SENSITIVE Sensitive     IMIPENEM 4 SENSITIVE Sensitive     NITROFURANTOIN  256 RESISTANT Resistant     TRIMETH /SULFA  <=20 SENSITIVE Sensitive     AMPICILLIN/SULBACTAM <=2 SENSITIVE Sensitive     PIP/TAZO <=4 SENSITIVE Sensitive ug/mL    * 10,000 COLONIES/mL PROTEUS MIRABILIS  Gastrointestinal Panel by PCR , Stool     Status: None   Collection Time: 09/01/23 12:50 AM    Specimen: Stool  Result Value Ref Range Status   Campylobacter species NOT DETECTED NOT DETECTED Final   Plesimonas shigelloides NOT DETECTED NOT DETECTED Final   Salmonella species NOT DETECTED NOT DETECTED Final   Yersinia enterocolitica NOT DETECTED NOT DETECTED Final   Vibrio species NOT DETECTED NOT DETECTED Final   Vibrio cholerae NOT DETECTED NOT DETECTED Final   Enteroaggregative E coli (EAEC) NOT DETECTED NOT DETECTED Final   Enteropathogenic E coli (EPEC) NOT DETECTED NOT DETECTED Final   Enterotoxigenic E coli (ETEC) NOT DETECTED NOT DETECTED Final   Shiga like toxin producing E coli (STEC) NOT DETECTED NOT DETECTED Final   Shigella/Enteroinvasive E coli (EIEC) NOT DETECTED NOT DETECTED Final   Cryptosporidium NOT DETECTED NOT DETECTED Final   Cyclospora cayetanensis NOT DETECTED NOT DETECTED Final   Entamoeba histolytica NOT DETECTED NOT DETECTED Final   Giardia lamblia NOT DETECTED NOT DETECTED Final   Adenovirus F40/41 NOT DETECTED NOT DETECTED Final   Astrovirus NOT DETECTED NOT DETECTED Final   Norovirus GI/GII NOT DETECTED NOT DETECTED Final   Rotavirus A NOT DETECTED NOT DETECTED Final   Sapovirus (I, II, IV, and V) NOT DETECTED NOT DETECTED Final    Comment: Performed at [facility], [address] [place], [place] [address]    Procedures and diagnostic studies:  No results found.  LOS: 7 days   Mohammedali Bedoy  [facility]   Pager on [email or web address]. If 7PM-7AM, please contact night-coverage at [email or web address]     09/05/2023, 10:39 AM

## 2023-09-05 NOTE — Progress Notes (Signed)
 Patient is incontinent of LG amount of loose stool, yellow with mucous. MD notified at this time. Will continue to monitor.

## 2023-09-06 DIAGNOSIS — N189 Chronic kidney disease, unspecified: Secondary | ICD-10-CM | POA: Diagnosis not present

## 2023-09-06 DIAGNOSIS — N179 Acute kidney failure, unspecified: Secondary | ICD-10-CM | POA: Diagnosis not present

## 2023-09-06 MED ORDER — MIDODRINE HCL 5 MG PO TABS
10.0000 mg | ORAL_TABLET | Freq: Three times a day (TID) | ORAL | Status: DC
  Administered 2023-09-06 – 2023-09-14 (×23): 10 mg via ORAL
  Filled 2023-09-06 (×23): qty 2

## 2023-09-06 NOTE — Progress Notes (Signed)
 Occupational Therapy Treatment Patient Details Name: Tricia [name] MRN: [id number] DOB: 12-Nov-1960 Today's Date: 09/06/2023   History of present illness Pt is a 63 y.o. female admitted with acute kidney injury superimposed on CKD, UTI, orthostatic hypotension with syncope, and rhabdomyolysis.  PMHx includes: COPD, Asthma, osteoarthritis, anxiety, depression, TypeII DM, Diastolic Dysfunction, GERD, HTN, Dyslipidemia, Rhabdomyolitis 2/2 fall, Parkinson's Disease, and syncope.   OT comments  Pt is supine in bed on arrival. Pleasant and agreeable to OT session. She denies pain. Orthostatic vitals taken:   BP- Lying BP- Sitting Pulse- Sitting BP- Standing at 0 minutes Pulse- Standing at 0 minutes Standing BP 3 mins HR standing at 3 mins 120  09/06/23 1250 117/67 103/67 108 (!) 80/59 116 104/68 120 BP returned to 148/83 once seated in recliner HR 103  Pt required CGA/SBA for bed mobility, Min A for STS from EOB to RW at lowest bed height. Pt noted with BM and required max A for peri-care in standing prior to SPT to recliner using RW with CGA. Pt fatigued after this and decline further activity.  Pt left in recliner with all needs in place and will cont to require skilled acute OT services to maximize her safety and IND to return to PLOF.       If plan is discharge home, recommend the following:  A little help with walking and/or transfers;A lot of help with bathing/dressing/bathroom;Direct supervision/assist for medications management;Supervision due to cognitive status;Direct supervision/assist for financial management;Assistance with cooking/housework;Assist for transportation;Help with stairs or ramp for entrance   Equipment Recommendations  Other (comment) (defer to next venue)    Recommendations for Other Services      Precautions / Restrictions Precautions Precautions: Fall Restrictions Weight Bearing Restrictions Per Provider Order: No Other Position/Activity Restrictions:  Orthostatic Hypotension       Mobility Bed Mobility Overal bed mobility: Needs Assistance       Supine to sit: Contact guard, Used rails     General bed mobility comments: able to reach EOB without assist this date    Transfers Overall transfer level: Needs assistance   Transfers: Sit to/from Stand, Bed to chair/wheelchair/BSC Sit to Stand: Min assist     Step pivot transfers: Contact guard assist     General transfer comment: Min A to STS from EOB at lowest height, able to stand x3-4 mins for BP and hygiene and then performed step pivot to recliner using RW with CGA and declined further activity d/t fatigue     Balance Overall balance assessment: Needs assistance Sitting-balance support: Feet supported Sitting balance-Leahy Scale: Good     Standing balance support: Bilateral upper extremity supported, During functional activity, Reliant on assistive device for balance Standing balance-Leahy Scale: Fair Standing balance comment: RW and CGA                           ADL either performed or assessed with clinical judgement   ADL Overall ADL's : Needs assistance/impaired                         Toilet Transfer: Contact guard assist;Rolling walker (2 wheels) Toilet Transfer Details (indicate cue type and reason): simulated via SPT from bed to recliner with CGA Toileting- Clothing Manipulation and Hygiene: Maximal assistance;Sit to/from stand Toileting - Clothing Manipulation Details (indicate cue type and reason): standing at EOB     Functional mobility during ADLs: Contact guard assist;Rolling walker (2 wheels)  Extremity/Trunk Assessment              Vision       Restaurant manager, fast food Communication: No apparent difficulties   Cognition Arousal: Alert Behavior During Therapy: WFL for tasks assessed/performed                                 Following commands: Intact         Cueing   Cueing Techniques: Verbal cues  Exercises      Shoulder Instructions       General Comments orthstatic position with initial STS then recovers    Pertinent Vitals/ Pain       Pain Assessment Pain Assessment: No/denies pain Pain Intervention(s): Monitored during session  Home Living                                          Prior Functioning/Environment              Frequency  Min 2X/week        Progress Toward Goals  OT Goals(current goals can now be found in the care plan section)  Progress towards OT goals: Progressing toward goals  Acute Rehab OT Goals Patient Stated Goal: get better OT Goal Formulation: With patient Time For Goal Achievement: 09/14/23 Potential to Achieve Goals: Good  Plan      Co-evaluation                 AM-PAC OT 6 Clicks Daily Activity     Outcome Measure   Help from another person eating meals?: None Help from another person taking care of personal grooming?: None Help from another person toileting, which includes using toliet, bedpan, or urinal?: A Lot Help from another person bathing (including washing, rinsing, drying)?: A Lot Help from another person to put on and taking off regular upper body clothing?: A Little Help from another person to put on and taking off regular lower body clothing?: A Lot 6 Click Score: 17    End of Session Equipment Utilized During Treatment: Rolling walker (2 wheels)  OT Visit Diagnosis: Unsteadiness on feet (R26.81);Muscle weakness (generalized) (M62.81);History of falling (Z91.81)   Activity Tolerance Patient tolerated treatment well   Patient Left with call bell/phone within reach;in chair;with chair alarm set   Nurse Communication Mobility status        Time: 8982-8955 OT Time Calculation (min): 27 min  Charges: OT General Charges $OT Visit: 1 Visit OT Treatments $Self Care/Home Management : 23-37 mins  Jamani Eley, OTR/L  09/06/23,  1:03 PM   Jden Want E Mayu Ronk 09/06/2023, 12:58 PM

## 2023-09-06 NOTE — Progress Notes (Signed)
 Physical Therapy Treatment Patient Details Name: Tricia [name] MRN: [id number] DOB: 03-28-1960 Today's Date: 09/06/2023   History of Present Illness Pt is a 63 y.o. female admitted with acute kidney injury superimposed on CKD, UTI, orthostatic hypotension with syncope, and rhabdomyolysis.  PMHx includes: COPD, Asthma, osteoarthritis, anxiety, depression, TypeII DM, Diastolic Dysfunction, GERD, HTN, Dyslipidemia, Rhabdomyolitis 2/2 fall, Parkinson's Disease, and syncope.    PT Comments  Pt was pleasant and motivated to participate during the session and put forth good effort throughout. Pt found in recliner and taken through orthostatic vitals as follows with BP and (HR): Seated: 118/66 (102), Standing x 0 min: 81/59 (113), Standing x 3 min: 88/66 (121), and back in Sitting: 148/74 (99).  Pt with only mild orthostatic symptoms during the session and between 0 min and 3 min standing BPs was able to perform marching in place and limited gait with only CGA.  Pt required the use of her BUEs as well as increased effort to come to standing and to control returning to sitting with cuing provided for sequencing.  Pt will benefit from continued PT services upon discharge to safely address deficits listed in patient problem list for decreased caregiver assistance and eventual return to PLOF.       If plan is discharge home, recommend the following: Assistance with cooking/housework;Assist for transportation;Help with stairs or ramp for entrance;A lot of help with walking and/or transfers;A little help with bathing/dressing/bathroom   Can travel by private vehicle     No  Equipment Recommendations  Other (comment) (TBD at next venue of care)    Recommendations for Other Services       Precautions / Restrictions Precautions Precautions: Fall Restrictions Weight Bearing Restrictions Per Provider Order: No Other Position/Activity Restrictions: Orthostatic Hypotension     Mobility  Bed  Mobility               General bed mobility comments: NT, pt in recliner pre/post session    Transfers Overall transfer level: Needs assistance   Transfers: Sit to/from Stand Sit to Stand: Contact guard assist           General transfer comment: Extra time and effort and cues needed for proper hand placement    Ambulation/Gait Ambulation/Gait assistance: Contact guard assist Gait Distance (Feet): 10 Feet Assistive device: Rolling walker (2 wheels) Gait Pattern/deviations: Step-through pattern, Decreased step length - right, Decreased step length - left, Trunk flexed Gait velocity: decreased     General Gait Details: Pt able to take several small steps near the EOB forwards/backwards with mod lean on the RW for support but with no overt LOB   Stairs             Wheelchair Mobility     Tilt Bed    Modified Rankin (Stroke Patients Only)       Balance Overall balance assessment: Needs assistance         Standing balance support: Bilateral upper extremity supported, During functional activity, Reliant on assistive device for balance Standing balance-Leahy Scale: Fair                              Hotel manager: No apparent difficulties  Cognition Arousal: Alert Behavior During Therapy: WFL for tasks assessed/performed   PT - Cognitive impairments: No apparent impairments  Following commands: Intact      Cueing Cueing Techniques: Verbal cues  Exercises Total Joint Exercises Ankle Circles/Pumps: Strengthening, Both, 10 reps Long Arc Quad: Strengthening, Both, 10 reps Knee Flexion: Strengthening, Both, 10 reps Marching in Standing: Strengthening, Both, 10 reps, Standing    General Comments        Pertinent Vitals/Pain Pain Assessment Pain Assessment: 0-10 Pain Score: 2  Pain Location: abdominal cramping Pain Descriptors / Indicators: Sore Pain Intervention(s):  Repositioned, Premedicated before session, Monitored during session    Home Living                          Prior Function            PT Goals (current goals can now be found in the care plan section) Progress towards PT goals: PT to reassess next treatment    Frequency    Min 2X/week      PT Plan      Co-evaluation              AM-PAC PT 6 Clicks Mobility   Outcome Measure  Help needed turning from your back to your side while in a flat bed without using bedrails?: A Little Help needed moving from lying on your back to sitting on the side of a flat bed without using bedrails?: A Little Help needed moving to and from a bed to a chair (including a wheelchair)?: A Little Help needed standing up from a chair using your arms (e.g., wheelchair or bedside chair)?: A Little Help needed to walk in hospital room?: A Lot Help needed climbing 3-5 steps with a railing? : A Lot 6 Click Score: 16    End of Session Equipment Utilized During Treatment: Gait belt Activity Tolerance: Other (comment) (limited by orthostatic hypotension) Patient left: in chair;with call bell/phone within reach;with chair alarm set Nurse Communication: Mobility status, orthostatic vitals per above  PT Visit Diagnosis: Muscle weakness (generalized) (M62.81);Difficulty in walking, not elsewhere classified (R26.2)     Time: 8865-8841 PT Time Calculation (min) (ACUTE ONLY): 24 min  Charges:    $Therapeutic Exercise: 8-22 mins $Therapeutic Activity: 8-22 mins PT General Charges $$ ACUTE PT VISIT: 1 Visit                     D. [name] PT, DPT 09/06/23, 12:17 PM

## 2023-09-06 NOTE — Plan of Care (Signed)

## 2023-09-06 NOTE — Progress Notes (Addendum)
 Progress Note    Tricia [name]  FMW:[id number] DOB: 02/26/1960  DOA: 08/28/2023 PCP: Sowles, Krichna, MD      Brief Narrative:    Medical records reviewed and are as summarized below:  Tricia [name] is a 63 y.o. female with chronic pain, COPD, asthma, osteoarthritis, anxiety, depression, type 2 diabetes, diastolic CHF, GERD, hypertension, dyslipidemia, who presents to the ED with syncopal episode.  Initial vitals in the ED are 125/100, creatinine elevation of 2.55, CK  659.  Noncon head CT without acute intracranial abnormalities.  Patient received 2 L NS and was admitted.  Gradually her mental status has improved but she remains intermittently altered.  We have significant concerns about her ability to take care of herself at home.       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   S/p syncope: There is probably from orthostatic hypotension. S/p treatment with IV fluids. 2D echo showed preserved EF, grade 1 diastolic dysfunction.   Recurrent orthostatic hypotension: This may be chronic.  Risk factors for this include polypharmacy, diabetes mellitus and Parkinson's disease. Patient was still orthostatic today but she was asymptomatic. Orthostatic VS for the past 24 hrs:  BP- Lying Pulse- Lying BP- Sitting Pulse- Sitting BP- Standing at 0 minutes  Pulse- Standing at 0 minutes  09/06/23 1250 117/67 -- 103/67 108 (!) 80/59 116  09/06/23 1140 -- -- 118/66 102 (!) 81/59 113  09/06/23 0753 115/62 90 108/60 95 (!) 78/53 110  09/05/23 2011 136/61 91 140/67 104 99/71 113  09/05/23 2000 136/61 91 140/67 104 99/71 113   Increase midodrine  from 5 mg to 10 mg 3 times daily.   Polypharmacy, chronic pain, opioid dependence: - Patient is on multiple psychiatric medications, duplicate antihistamine therapy, as well as chronic opioid therapy - Home meds include: BuSpar , Cymbalta , Flexeril , Xyzal , gabapentin , morphine , MS Contin , Seroquel , hydroxyzine . - Hydroxyzine  and immediate release morphine  have already been discontinued.    - Continue OxyContin  for now.   Outpatient follow-up with pain clinic for de-escalation of opioids strongly recommended.   Urinary retention: Foley catheter placed after multiple In-N-Out ureteral catheterization.  Failed voiding trial on 09/01/2023.  She was evaluated by the urologist on 09/02/2023.  Foley catheter has been replaced.  It was recommended that patient be discharged on Foley catheter.  Outpatient follow-up with urologist for voiding trial.   Acute UTI: S/p treatment with IV ceftriaxone  followed by IV cefadroxil . Urine culture from 08/31/2023 showed Klebsiella pneumoniae and Proteus mirabilis.   AKI, probable CKD stage IIIa: Creatinine is stable. Elevated CK: Does not meet criteria for rhabdomyolysis.  CK improved from 659-547. Hypokalemia: Improved Hypomagnesemia: Improved Hypophosphatemia: Replete with potassium phosphate    Diarrhea: Imodium  as needed.  No indication for C. difficile testing at this time.   Comorbidities include COPD, Parkinson's disease on carbidopa -levodopa , anxiety, depression, type II DM, GERD, moderate malnutrition, hypoalbuminemia   General Weakness: PT and  OT recommended discharge to SNF.  Follow-up with TOC to assist with disposition.    I called Ozell, son-in-law, (at  [phone]) but there was no response.  I left a voice message for him to call back with questions.    Diet Order             DIET DYS 3 Fluid consistency: Thin  Diet effective now                            Consultants: Information systems manager  Procedures: None    Medications:    (feeding supplement) PROSource Plus  30 mL Oral BID BM   busPIRone   7.5 mg Oral BID   carbidopa -levodopa   1 tablet Oral QHS   carbidopa -levodopa   1.5 tablet Oral TID   Chlorhexidine  Gluconate Cloth  6 each Topical Daily   colchicine   0.6 mg Oral Daily   cyclobenzaprine   10 mg Oral QHS   DULoxetine   60 mg Oral Daily   enoxaparin  (LOVENOX ) injection  40 mg Subcutaneous Q24H   feeding supplement  237 mL Oral TID BM   fiber supplement (BANATROL TF)  60 mL Oral BID   fluticasone   2 spray Each Nare Daily   gabapentin   400 mg Oral TID   ipratropium  2 spray Each Nare QID   midodrine   10 mg Oral TID WC   montelukast   10 mg Oral QHS   multivitamin with minerals  1 tablet Oral Daily   oxyCODONE   15 mg Oral Q12H   pantoprazole   40 mg Oral Daily   QUEtiapine   25 mg Oral QHS   sodium chloride   1 spray Each Nare Q4H   theophylline   400 mg Oral Daily   umeclidinium bromide   1 puff Inhalation Daily   Continuous Infusions:   Anti-infectives (From admission, onward)    Start     Dose/Rate Route Frequency Ordered Stop   09/03/23 1100  cefadroxil  (DURICEF) capsule 500 mg        500 mg Oral 2 times daily 09/03/23 1007 09/04/23 2144   08/31/23 2200  cefTRIAXone  (ROCEPHIN ) 1 g in sodium chloride  0.9 % 100 mL IVPB  Status:  Discontinued        1 g 200 mL/hr over 30 Minutes Intravenous Every 24 hours 08/31/23 2041 09/03/23 1007              Family Communication/Anticipated D/C date and plan/Code Status   DVT prophylaxis: Place TED hose Start: 09/02/23 1524 enoxaparin  (LOVENOX ) injection 40 mg Start: 08/30/23 2200     Code Status: Full Code  Family Communication:  None Disposition Plan: Plan discharge to SNF   Status is: Inpatient Remains inpatient appropriate because: Awaiting placement to SNF       Subjective:   No acute events overnight.  She has no complaints.  She is hoping to go home today.  Objective:    Vitals:   09/06/23 0241 09/06/23 0736 09/06/23 1250 09/06/23 1534  BP: 127/68 115/62 (!) 148/83 (!) 112/57  Pulse: 98 90  97  Resp: 18 16  17   Temp: 98.2 F (36.8 C) 98.2 F (36.8 C)  98.1 F (36.7 C)  TempSrc:  Oral    SpO2: 99% 95%  99%  Weight:      Height:       Orthostatic VS for the past 24 hrs:  BP- Lying Pulse- Lying BP- Sitting Pulse- Sitting BP- Standing at 0 minutes Pulse- Standing  at 0 minutes  09/06/23 1250 117/67 -- 103/67 108 (!) 80/59 116  09/06/23 1140 -- -- 118/66 102 (!) 81/59 113  09/06/23 0753 115/62 90 108/60 95 (!) 78/53 110  09/05/23 2011 136/61 91 140/67 104 99/71 113  09/05/23 2000 136/61 91 140/67 104 99/71 113     Intake/Output Summary (Last 24 hours) at 09/06/2023 1543 Last data filed at 09/06/2023 1456 Gross per 24 hour  Intake 0 ml  Output --  Net 0 ml   Filed Weights   08/28/23 1256  Weight: 71.7 kg    Exam:   GEN: NAD SKIN: Warm and dry EYES: Anicteric ENT: MMM CV: RRR PULM: CTA B ABD: soft, ND, NT, +BS CNS: AAO x 3, non focal EXT: No edema or tenderness       Data Reviewed:   I have personally reviewed following labs and imaging studies:  Labs: Labs show the following:   Basic Metabolic Panel: Recent Labs  Lab 09/01/23 0234 09/02/23 0434 09/03/23 0506 09/04/23 0504 09/05/23 0440  NA 132* 134* 134* 135 135  K 3.8 3.6 3.1* 3.1* 3.5  CL 106 105 105 107 109  CO2 18* 22 23 24 22   GLUCOSE 115* 188* 129* 127* 114*  BUN 18 15 13 12 10   CREATININE 1.26* 1.29* 1.32* 1.16* 1.08*  CALCIUM  7.4* 7.9* 7.3* 7.3* 7.5*  MG 1.5* 2.3 1.8 1.7 1.7  PHOS 2.6 3.1 2.3* 2.6 2.2*   GFR Estimated Creatinine Clearance: 54.5 mL/min (A) (by C-G formula based on SCr of  1.08 mg/dL (H)). Liver Function Tests: Recent Labs  Lab 09/01/23 0234 09/02/23 0434 09/03/23 0506 09/04/23 0504 09/05/23 0440  AST 52* 61* 86* 88* 77*  ALT 5 6 6  <5 7  ALKPHOS 138* 134* 138* 140* 144*  BILITOT 1.6* 1.3* 1.3* 1.1 1.1  PROT 4.9* 5.0* 4.6* 4.6* 5.1*  ALBUMIN 2.0* 2.0* 1.8* 1.8* 1.8*   No results for input(s): LIPASE, AMYLASE in the last 168 hours. No results for input(s): AMMONIA in the last 168 hours. Coagulation profile No results for input(s): INR, PROTIME in the last 168 hours.  CBC: Recent Labs  Lab 09/01/23 0234 09/02/23 0434 09/03/23 0506 09/04/23 0504 09/05/23 0440  WBC 8.2 6.7 7.8 8.8 7.7  NEUTROABS 6.3 5.1 5.1 6.1 4.8  HGB 8.9* 9.2* 9.8* 9.8* 9.7*  HCT 25.6* 26.7* 27.9* 28.3* 28.2*  MCV 88.6 89.9 90.0 89.0 91.0  PLT 231 227 232 235 246   Cardiac Enzymes: No results for input(s): CKTOTAL, CKMB, CKMBINDEX, TROPONINI in the last 168 hours.  BNP (last 3 results) No results for input(s): PROBNP in the last 8760 hours. CBG: No results for input(s): GLUCAP in the last 168 hours. D-Dimer: No results for input(s): DDIMER in the last 72 hours. Hgb A1c: No results for input(s): HGBA1C in the last 72 hours. Lipid Profile: No results for input(s): CHOL, HDL, LDLCALC, TRIG, CHOLHDL, LDLDIRECT in the last 72 hours. Thyroid  function studies: No results for input(s): TSH, T4TOTAL, T3FREE, THYROIDAB in the last 72 hours.  Invalid input(s): FREET3 Anemia work up: No results for input(s): VITAMINB12, FOLATE, FERRITIN, TIBC, IRON, RETICCTPCT in the last 72 hours. Sepsis Labs: Recent Labs  Lab 09/02/23 0434 09/03/23 0506 09/04/23 0504 09/05/23 0440  WBC 6.7 7.8 8.8 7.7    Microbiology Recent Results (from the past 240 hours)  MRSA Next Gen by PCR, Nasal     Status: Abnormal   Collection Time: 08/29/23  5:20 PM   Specimen: Nasal Mucosa; Nasal Swab  Result Value Ref Range Status   MRSA  by PCR Next Gen DETECTED (A) NOT DETECTED Final    Comment: RESULT CALLED TO, READ BACK BY AND VERIFIED WITH: BYNUM, JOYYA D., LPN @2044  08/30/2023 COP (NOTE) The GeneXpert MRSA Assay (FDA approved for NASAL specimens only), is one component of a comprehensive MRSA colonization surveillance program. It is not intended to diagnose MRSA infection nor to guide or monitor treatment for MRSA infections. Test performance is not FDA approved in patients less than 32 years old. Performed at [facility], [address], [place], [place] [address]   Urine Culture (for pregnant, neutropenic or urologic patients or patients with an indwelling urinary catheter)     Status: Abnormal   Collection Time: 08/31/23  9:08 AM   Specimen: Urine, Catheterized  Result Value Ref Range Status   Specimen Description   Final    URINE, CATHETERIZED Performed at [facility], [address], [place], [place] [address]    Special Requests   Final    NONE Performed at [facility], [address] [place], [place] [address]    Culture (A)  Final    >=100,000 COLONIES/mL KLEBSIELLA PNEUMONIAE 10,000 COLONIES/mL PROTEUS MIRABILIS    Report Status 09/03/2023 FINAL  Final   Organism ID, Bacteria KLEBSIELLA PNEUMONIAE (A)  Final   Organism ID, Bacteria PROTEUS MIRABILIS (A)  Final      Susceptibility   Klebsiella pneumoniae - MIC*    AMPICILLIN RESISTANT Resistant     CEFAZOLIN <=4 SENSITIVE Sensitive     CEFEPIME  <=0.12 SENSITIVE Sensitive     CEFTRIAXONE  <=0.25 SENSITIVE Sensitive     CIPROFLOXACIN  <=0.25 SENSITIVE Sensitive     GENTAMICIN <=1 SENSITIVE Sensitive     IMIPENEM <=0.25 SENSITIVE Sensitive     NITROFURANTOIN  64 INTERMEDIATE Intermediate     TRIMETH /SULFA  <=20 SENSITIVE Sensitive     AMPICILLIN/SULBACTAM <=2 SENSITIVE Sensitive     PIP/TAZO <=4 SENSITIVE Sensitive ug/mL    * >=100,000 COLONIES/mL KLEBSIELLA PNEUMONIAE   Proteus mirabilis - MIC*    AMPICILLIN <=2  SENSITIVE Sensitive     CEFAZOLIN <=4 SENSITIVE Sensitive     CEFEPIME  <=0.12 SENSITIVE Sensitive     CEFTRIAXONE  <=0.25 SENSITIVE Sensitive     CIPROFLOXACIN  <=0.25 SENSITIVE Sensitive     GENTAMICIN <=1 SENSITIVE Sensitive     IMIPENEM 4 SENSITIVE Sensitive     NITROFURANTOIN  256 RESISTANT Resistant     TRIMETH /SULFA  <=20 SENSITIVE Sensitive     AMPICILLIN/SULBACTAM <=2 SENSITIVE Sensitive     PIP/TAZO <=4 SENSITIVE Sensitive ug/mL    * 10,000 COLONIES/mL PROTEUS MIRABILIS  Gastrointestinal Panel by PCR , Stool     Status: None   Collection Time: 09/01/23 12:50 AM   Specimen: Stool  Result Value Ref Range Status   Campylobacter species NOT DETECTED NOT DETECTED Final   Plesimonas shigelloides NOT DETECTED NOT DETECTED Final   Salmonella species NOT DETECTED NOT DETECTED Final   Yersinia enterocolitica NOT DETECTED NOT DETECTED Final   Vibrio species NOT DETECTED NOT DETECTED Final   Vibrio cholerae NOT DETECTED NOT DETECTED Final   Enteroaggregative E coli (EAEC) NOT DETECTED NOT DETECTED Final   Enteropathogenic E coli (EPEC) NOT DETECTED NOT DETECTED Final   Enterotoxigenic E coli (ETEC) NOT DETECTED NOT DETECTED Final   Shiga like toxin producing E coli (STEC) NOT DETECTED NOT DETECTED Final   Shigella/Enteroinvasive E coli (EIEC) NOT DETECTED NOT DETECTED Final   Cryptosporidium NOT DETECTED NOT DETECTED Final  Cyclospora cayetanensis NOT DETECTED NOT DETECTED Final   Entamoeba histolytica NOT DETECTED NOT DETECTED Final   Giardia lamblia NOT DETECTED NOT DETECTED Final   Adenovirus F40/41 NOT DETECTED NOT DETECTED Final   Astrovirus NOT DETECTED NOT DETECTED Final   Norovirus GI/GII NOT DETECTED NOT DETECTED Final   Rotavirus A NOT DETECTED NOT DETECTED Final   Sapovirus (I, II, IV, and V) NOT DETECTED NOT DETECTED Final    Comment: Performed at [facility], [address] [place], [place] [address]    Procedures and diagnostic studies:  No results  found.             LOS: 8 days   Tobi Leinweber  [facility]   Pager on [email or web address]. If 7PM-7AM, please contact night-coverage at [email or web address]     09/06/2023, 3:43 PM

## 2023-09-07 DIAGNOSIS — N179 Acute kidney failure, unspecified: Secondary | ICD-10-CM | POA: Diagnosis not present

## 2023-09-07 DIAGNOSIS — N189 Chronic kidney disease, unspecified: Secondary | ICD-10-CM | POA: Diagnosis not present

## 2023-09-07 MED ORDER — PYRIDOSTIGMINE BROMIDE 60 MG PO TABS
30.0000 mg | ORAL_TABLET | Freq: Three times a day (TID) | ORAL | Status: DC
  Administered 2023-09-07 – 2023-09-09 (×5): 30 mg via ORAL
  Filled 2023-09-07 (×7): qty 0.5

## 2023-09-07 NOTE — Progress Notes (Addendum)
 Progress Note    Tricia [name]  FMW:[id number] DOB: 1960/11/22  DOA: 08/28/2023 PCP: Sowles, Krichna, MD      Brief Narrative:    Medical records reviewed and are as summarized below:  Tricia [name] is a 63 y.o. female with chronic pain, COPD, asthma, osteoarthritis, anxiety, depression, type 2 diabetes, diastolic CHF, GERD, hypertension, dyslipidemia, who presents to the ED with syncopal episode.  Initial vitals in the ED are 125/100, creatinine elevation of 2.55, CK  659.  Noncon head CT without acute intracranial abnormalities.  Patient received 2 L NS and was admitted.  Gradually her mental status has improved but she remains intermittently altered.  We have significant concerns about her ability to take care of herself at home.       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   S/p syncope: There is probably from orthostatic hypotension. S/p treatment with IV fluids. 2D echo showed preserved EF, grade 1 diastolic dysfunction.   Recurrent orthostatic hypotension: This may be chronic.  Risk factors for this include polypharmacy, diabetes mellitus and Parkinson's disease. She still orthostatic. Lying BP 127/70, sitting BP 99/61 and standing BP 82/52. Continue midodrine  at 10 mg 3 times daily. Start pyridostigmine  30 mg every 8  hours.   Polypharmacy, chronic pain, opioid dependence: - Patient is on multiple psychiatric medications, duplicate antihistamine therapy, as well as chronic opioid therapy - Home meds include: BuSpar , Cymbalta , Flexeril , Xyzal , gabapentin , morphine , MS Contin , Seroquel , hydroxyzine . - Hydroxyzine  and immediate release morphine  have already been discontinued.    - Continue OxyContin  for now.   Outpatient follow-up with pain clinic for de-escalation of opioids strongly recommended.   Urinary retention: Foley catheter placed after multiple In-N-Out ureteral catheterization.  Failed voiding trial on 09/01/2023.  She was evaluated by the urologist on 09/02/2023.  Foley catheter has been replaced.  It was recommended that patient be discharged on Foley catheter.  Outpatient follow-up with urologist for voiding trial.   Acute UTI: S/p treatment with IV ceftriaxone  followed by IV cefadroxil . Urine culture from 08/31/2023 showed Klebsiella pneumoniae and Proteus mirabilis.   AKI, probable CKD stage IIIa: Creatinine is stable. Elevated CK: Does not meet criteria for rhabdomyolysis.  CK improved from 659-547. Hypokalemia: Improved Hypomagnesemia: Improved Hypophosphatemia: Replete with potassium phosphate    Diarrhea: Imodium  as needed.  No indication for C. difficile testing at this time.   Comorbidities include COPD, Parkinson's disease on carbidopa -levodopa , anxiety, depression, type II DM, GERD, moderate malnutrition, hypoalbuminemia   General Weakness: PT and OT recommended discharge to SNF.  Follow-up with TOC to assist with disposition.    Awaiting placement at SNF.    Diet Order             DIET DYS 3 Fluid consistency: Thin  Diet effective now  Consultants: Information systems manager  Procedures: None    Medications:    (feeding supplement) PROSource Plus  30 mL Oral BID BM   busPIRone   7.5 mg Oral BID   carbidopa -levodopa   1  tablet Oral QHS   carbidopa -levodopa   1.5 tablet Oral TID   Chlorhexidine  Gluconate Cloth  6 each Topical Daily   colchicine   0.6 mg Oral Daily   cyclobenzaprine   10 mg Oral QHS   DULoxetine   60 mg Oral Daily   enoxaparin  (LOVENOX ) injection  40 mg Subcutaneous Q24H   feeding supplement  237 mL Oral TID BM   fiber supplement (BANATROL TF)  60 mL Oral BID   fluticasone   2 spray Each Nare Daily   gabapentin   400 mg Oral TID   ipratropium  2 spray Each Nare QID   midodrine   10 mg Oral TID WC   montelukast   10 mg Oral QHS   multivitamin with minerals  1 tablet Oral Daily   oxyCODONE   15 mg Oral Q12H   pantoprazole   40 mg Oral Daily   pyridostigmine   30 mg Oral Q8H   QUEtiapine   25 mg Oral QHS   sodium chloride   1 spray Each Nare Q4H   theophylline   400 mg Oral Daily   umeclidinium bromide   1 puff Inhalation Daily   Continuous Infusions:   Anti-infectives (From admission, onward)    Start     Dose/Rate Route Frequency Ordered Stop   09/03/23 1100  cefadroxil  (DURICEF) capsule 500 mg        500 mg Oral 2 times daily 09/03/23 1007 09/04/23 2144   08/31/23 2200  cefTRIAXone  (ROCEPHIN ) 1 g in sodium chloride  0.9 % 100 mL IVPB  Status:  Discontinued        1 g 200 mL/hr over 30 Minutes Intravenous Every 24 hours 08/31/23 2041 09/03/23 1007              Family Communication/Anticipated D/C date and plan/Code Status   DVT prophylaxis: Place TED hose Start: 09/02/23 1524 enoxaparin  (LOVENOX ) injection 40 mg Start: 08/30/23 2200     Code Status: Full Code  Family Communication: None Disposition Plan: Plan discharge to SNF   Status is: Inpatient Remains inpatient appropriate because: Awaiting placement to SNF       Subjective:   Interval events noted.  She has no complaints.  She said she feels a difference after midodrine  was started.  She said she stood up this morning for vital signs but she did not feel dizzy.  She said Ozell, son-in-law, is a Naval architect  and is probably on the road by now.  She thinks her granddaughter can pick her up.  Objective:    Vitals:   09/07/23 0240 09/07/23 0942 09/07/23 0948 09/07/23 0951  BP: (!) 117/58 127/70 99/61 (!) 82/52  Pulse: 89 85 95 (!) 108  Resp: 19 17    Temp: 98.8 F (37.1 C) 97.9 F (36.6 C)    TempSrc:      SpO2: 96% 97%    Weight:      Height:       Orthostatic VS for the past 24 hrs:  BP- Lying Pulse- Lying BP- Sitting Pulse- Sitting BP- Standing at 0 minutes Pulse- Standing at 0 minutes  09/06/23 2041 135/68 80 121/68 98 102/58 106  09/06/23 2000 135/68 80 121/68 98 102/58 106     No intake or output data in the 24 hours ending 09/07/23 1558  Filed Weights   08/28/23  1256  Weight: 71.7 kg    Exam:  GEN: NAD SKIN: Warm and dry, EYES: No pallor or icterus ENT: MMM CV: RRR PULM: CTA B ABD: soft, ND, NT, +BS CNS: AAO x 3, non focal EXT: No edema or tenderness        Data Reviewed:   I have personally reviewed following labs and imaging studies:  Labs: Labs show the following:   Basic Metabolic Panel: Recent Labs  Lab 09/01/23 0234 09/02/23 0434 09/03/23 0506 09/04/23 0504 09/05/23 0440  NA 132* 134* 134* 135 135  K 3.8 3.6 3.1* 3.1* 3.5  CL 106 105 105 107 109  CO2 18* 22 23 24 22   GLUCOSE 115* 188* 129* 127* 114*  BUN 18 15 13 12 10   CREATININE 1.26* 1.29* 1.32* 1.16* 1.08*  CALCIUM  7.4* 7.9* 7.3* 7.3* 7.5*  MG 1.5* 2.3 1.8 1.7 1.7  PHOS 2.6 3.1 2.3* 2.6 2.2*   GFR Estimated Creatinine Clearance: 54.5 mL/min (A) (by C-G formula based on SCr of 1.08 mg/dL (H)). Liver Function Tests: Recent Labs  Lab 09/01/23 0234 09/02/23 0434 09/03/23 0506 09/04/23 0504 09/05/23 0440  AST 52* 61* 86* 88* 77*  ALT 5 6 6  <5 7  ALKPHOS 138* 134* 138* 140* 144*  BILITOT 1.6* 1.3* 1.3* 1.1 1.1  PROT 4.9* 5.0* 4.6* 4.6* 5.1*  ALBUMIN 2.0* 2.0* 1.8* 1.8* 1.8*   No results for input(s): LIPASE, AMYLASE in the last 168 hours. No results for input(s):  AMMONIA in the last 168 hours. Coagulation profile No results for input(s): INR, PROTIME in the last 168 hours.  CBC: Recent Labs  Lab 09/01/23 0234 09/02/23 0434 09/03/23 0506 09/04/23 0504 09/05/23 0440  WBC 8.2 6.7 7.8 8.8 7.7  NEUTROABS 6.3 5.1 5.1 6.1 4.8  HGB 8.9* 9.2* 9.8* 9.8* 9.7*  HCT 25.6* 26.7* 27.9* 28.3* 28.2*  MCV 88.6 89.9 90.0 89.0 91.0  PLT 231 227 232 235 246   Cardiac Enzymes: No results for input(s): CKTOTAL, CKMB, CKMBINDEX, TROPONINI in the last 168 hours.  BNP (last 3 results) No results for input(s): PROBNP in the last 8760 hours. CBG: No results for input(s): GLUCAP in the last 168 hours. D-Dimer: No results for input(s): DDIMER in the last 72 hours. Hgb A1c: No results for input(s): HGBA1C in the last 72 hours. Lipid Profile: No results for input(s): CHOL, HDL, LDLCALC, TRIG, CHOLHDL, LDLDIRECT in the last 72 hours. Thyroid  function studies: No results for input(s): TSH, T4TOTAL, T3FREE, THYROIDAB in the last 72 hours.  Invalid input(s): FREET3 Anemia work up: No results for input(s): VITAMINB12, FOLATE, FERRITIN, TIBC, IRON, RETICCTPCT in the last 72 hours. Sepsis Labs: Recent Labs  Lab 09/02/23 0434 09/03/23 0506 09/04/23 0504 09/05/23 0440  WBC 6.7 7.8 8.8 7.7    Microbiology Recent Results (from the past 240 hours)  MRSA Next Gen by PCR, Nasal     Status: Abnormal   Collection Time: 08/29/23  5:20 PM   Specimen: Nasal Mucosa; Nasal Swab  Result Value Ref Range Status   MRSA by PCR Next Gen DETECTED (A) NOT DETECTED Final    Comment: RESULT CALLED TO, READ BACK BY AND VERIFIED WITH: BYNUM, JOYYA D., LPN @2044  08/30/2023 COP (NOTE) The GeneXpert MRSA Assay (FDA approved for NASAL specimens only), is one component of a comprehensive MRSA colonization surveillance program. It is not intended to diagnose MRSA infection nor to guide or monitor treatment for MRSA  infections. Test performance is not FDA approved in patients less than 2  years old. Performed at [facility], [address] [place], [place] [address]   Urine Culture (for pregnant, neutropenic or urologic patients or patients with an indwelling urinary catheter)     Status: Abnormal   Collection Time: 08/31/23  9:08 AM   Specimen: Urine, Catheterized  Result Value Ref Range Status   Specimen Description   Final    URINE, CATHETERIZED Performed at [facility], [address], [place], [place] [address]    Special Requests   Final    NONE Performed at [facility], [address] [place], [place] [address]    Culture (A)  Final    >=100,000 COLONIES/mL KLEBSIELLA PNEUMONIAE 10,000 COLONIES/mL PROTEUS MIRABILIS    Report Status 09/03/2023 FINAL  Final   Organism ID, Bacteria KLEBSIELLA PNEUMONIAE (A)  Final   Organism ID, Bacteria PROTEUS MIRABILIS (A)  Final      Susceptibility   Klebsiella pneumoniae - MIC*    AMPICILLIN RESISTANT Resistant     CEFAZOLIN <=4 SENSITIVE Sensitive     CEFEPIME  <=0.12 SENSITIVE Sensitive     CEFTRIAXONE  <=0.25 SENSITIVE Sensitive     CIPROFLOXACIN  <=0.25 SENSITIVE Sensitive     GENTAMICIN <=1 SENSITIVE Sensitive     IMIPENEM <=0.25 SENSITIVE Sensitive     NITROFURANTOIN  64 INTERMEDIATE Intermediate     TRIMETH /SULFA  <=20 SENSITIVE Sensitive     AMPICILLIN/SULBACTAM <=2 SENSITIVE Sensitive     PIP/TAZO <=4 SENSITIVE Sensitive ug/mL    * >=100,000 COLONIES/mL KLEBSIELLA PNEUMONIAE   Proteus mirabilis - MIC*    AMPICILLIN <=2 SENSITIVE Sensitive     CEFAZOLIN <=4 SENSITIVE Sensitive     CEFEPIME  <=0.12 SENSITIVE Sensitive     CEFTRIAXONE  <=0.25 SENSITIVE Sensitive     CIPROFLOXACIN  <=0.25 SENSITIVE Sensitive     GENTAMICIN <=1 SENSITIVE Sensitive     IMIPENEM 4 SENSITIVE Sensitive     NITROFURANTOIN  256 RESISTANT Resistant     TRIMETH /SULFA  <=20 SENSITIVE Sensitive     AMPICILLIN/SULBACTAM <=2 SENSITIVE  Sensitive     PIP/TAZO <=4 SENSITIVE Sensitive ug/mL    * 10,000 COLONIES/mL PROTEUS MIRABILIS  Gastrointestinal Panel by PCR , Stool     Status: None   Collection Time: 09/01/23 12:50 AM   Specimen: Stool  Result Value Ref Range Status   Campylobacter species NOT DETECTED NOT DETECTED Final   Plesimonas shigelloides NOT DETECTED NOT DETECTED Final   Salmonella species NOT DETECTED NOT DETECTED Final   Yersinia enterocolitica NOT DETECTED NOT DETECTED Final   Vibrio species NOT DETECTED NOT DETECTED Final   Vibrio cholerae NOT DETECTED NOT DETECTED Final   Enteroaggregative E coli (EAEC) NOT DETECTED NOT DETECTED Final   Enteropathogenic E coli (EPEC) NOT DETECTED NOT DETECTED Final   Enterotoxigenic E coli (ETEC) NOT DETECTED NOT DETECTED Final   Shiga like toxin producing E coli (STEC) NOT DETECTED NOT DETECTED Final   Shigella/Enteroinvasive E coli (EIEC) NOT DETECTED NOT DETECTED Final   Cryptosporidium NOT DETECTED NOT DETECTED Final   Cyclospora cayetanensis NOT DETECTED NOT DETECTED Final   Entamoeba histolytica NOT DETECTED NOT DETECTED Final   Giardia lamblia NOT DETECTED NOT DETECTED Final   Adenovirus F40/41 NOT DETECTED NOT DETECTED Final   Astrovirus NOT DETECTED NOT DETECTED Final   Norovirus GI/GII NOT DETECTED NOT DETECTED Final   Rotavirus A NOT DETECTED NOT DETECTED Final   Sapovirus (I, II, IV, and V) NOT DETECTED NOT DETECTED Final    Comment: Performed at [facility], [address] [place],  [place] [address]    Procedures and diagnostic studies:  No results found.             LOS: 9 days   Lima Chillemi  [employer] loss adjuster on [email or web address]. If 7PM-7AM, please contact night-coverage at [email or web address]     09/07/2023, 3:58 PM

## 2023-09-07 NOTE — Plan of Care (Signed)
   Problem: Education: Goal: Knowledge of General Education information will improve Description: Including pain rating scale, medication(s)/side effects and non-pharmacologic comfort measures Outcome: Progressing   Problem: Activity: Goal: Risk for activity intolerance will decrease Outcome: Progressing

## 2023-09-07 NOTE — Plan of Care (Signed)
  Problem: Education: Goal: Knowledge of General Education information will improve Description: Including pain rating scale, medication(s)/side effects and non-pharmacologic comfort measures Outcome: Progressing   Problem: Education: Goal: Knowledge of General Education information will improve Description: Including pain rating scale, medication(s)/side effects and non-pharmacologic comfort measures Outcome: Progressing   Problem: Health Behavior/Discharge Planning: Goal: Ability to manage health-related needs will improve Outcome: Progressing   Problem: Clinical Measurements: Goal: Ability to maintain clinical measurements within normal limits will improve Outcome: Progressing Goal: Will remain free from infection Outcome: Progressing Goal: Diagnostic test results will improve Outcome: Progressing Goal: Respiratory complications will improve Outcome: Progressing Goal: Cardiovascular complication will be avoided Outcome: Progressing   Problem: Elimination: Goal: Will not experience complications related to bowel motility Outcome: Progressing Goal: Will not experience complications related to urinary retention Outcome: Progressing   Problem: Coping: Goal: Level of anxiety will decrease Outcome: Progressing   Problem: Pain Managment: Goal: General experience of comfort will improve and/or be controlled Outcome: Progressing   Problem: Safety: Goal: Ability to remain free from injury will improve Outcome: Progressing   Problem: Skin Integrity: Goal: Risk for impaired skin integrity will decrease Outcome: Progressing

## 2023-09-08 DIAGNOSIS — N189 Chronic kidney disease, unspecified: Secondary | ICD-10-CM | POA: Diagnosis not present

## 2023-09-08 DIAGNOSIS — N179 Acute kidney failure, unspecified: Secondary | ICD-10-CM | POA: Diagnosis not present

## 2023-09-08 MED ORDER — ALUM & MAG HYDROXIDE-SIMETH 200-200-20 MG/5ML PO SUSP
30.0000 mL | Freq: Once | ORAL | Status: AC
  Administered 2023-09-08: 30 mL via ORAL
  Filled 2023-09-08: qty 30

## 2023-09-08 MED ORDER — POLYETHYLENE GLYCOL 3350 17 G PO PACK
17.0000 g | PACK | Freq: Every day | ORAL | Status: DC | PRN
  Administered 2023-09-08: 17 g via ORAL
  Filled 2023-09-08: qty 1

## 2023-09-08 NOTE — Progress Notes (Signed)
 Progress Note    Tricia [name]  FMW:[id number] DOB: 02-Jul-1960  DOA: 08/28/2023 PCP: Sowles, Krichna, MD      Brief Narrative:    Medical records reviewed and are as summarized below:  Tricia [name] is a 63 y.o. female with chronic pain, COPD, asthma, osteoarthritis, anxiety, depression, type 2 diabetes, diastolic CHF, GERD, hypertension, dyslipidemia, who presents to the ED with syncopal episode.  Initial vitals in the ED are 125/100, creatinine elevation of 2.55, CK  659.  Noncon head CT without acute intracranial abnormalities.  Patient received 2 L NS and was admitted.  Gradually her mental status has improved but she remains intermittently altered.  We have significant concerns about her ability to take care of herself at home.       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   S/p syncope: There is probably from orthostatic hypotension. S/p treatment with IV fluids. 2D echo showed preserved EF, grade 1 diastolic dysfunction.   Recurrent orthostatic hypotension: This may be chronic.  Risk factors for this include polypharmacy, diabetes mellitus and Parkinson's disease. She still orthostatic. Continue midodrine  and pyridostigmine .   Polypharmacy, chronic pain, opioid dependence: - Patient is on multiple psychiatric medications, duplicate  antihistamine therapy, as well as chronic opioid therapy - Home meds include: BuSpar , Cymbalta , Flexeril , Xyzal , gabapentin , morphine , MS Contin , Seroquel , hydroxyzine . - Hydroxyzine  and immediate release morphine  have already been discontinued.    - Continue OxyContin  for now.   Outpatient follow-up with pain clinic for de-escalation of opioids strongly recommended.   Urinary retention: Foley catheter placed after multiple In-N-Out ureteral catheterization.  Failed voiding trial on 09/01/2023.  She was evaluated by the urologist on 09/02/2023.  Foley catheter has been replaced.  It was recommended that patient be discharged on Foley catheter.  Outpatient follow-up with urologist for voiding trial.   Acute UTI: S/p treatment with IV ceftriaxone  followed by IV cefadroxil . Urine culture from 08/31/2023 showed Klebsiella pneumoniae and Proteus mirabilis.   AKI, probable CKD stage IIIa: Creatinine is stable. Elevated CK: Does not meet criteria for rhabdomyolysis.  CK improved from 659-547. Hypokalemia: Improved Hypomagnesemia: Improved Hypophosphatemia: Replete with potassium phosphate    Diarrhea: Imodium  as needed.  No indication for C. difficile testing at this time.   Comorbidities include COPD, Parkinson's disease on carbidopa -levodopa , anxiety, depression, type II DM, GERD, moderate malnutrition, hypoalbuminemia   General Weakness: PT and OT recommended discharge to SNF.  Follow-up with TOC to assist with disposition.   Patient expressed her frustration about still being in the hospital.  She said she wanted to go home.  This was discussed with Ozell, son-in-law, at the bedside.  Ozell said he is a Naval architect and he is usually on the road.  Patient is by herself at home when Ozell is out on the road.  Unfortunately, it is deemed unsafe for patient to live alone or be by herself at home  at this time.  Apart from Parkinson's disease, peripheral neuropathy, persistent orthostatic  hypotension, debility and her increased risk for falls, she also has an indwelling Foley catheter that can increase her risk for falls.  She is requiring assistance in the hospital to get to the bedside commode.   Patient had previously been evaluated by the psychiatrist who determined that she does not have capacity to make decisions for herself.  At this point, patient is unable to make any rational nor meaningful decisions regarding her safety at home.  Ozell (son-in-law) has agreed that it is unsafe for patient to go home at this time.  He said if something were to happen while he was away, it might be too late to get her the help that she needs..    Plan discussed with Ozell, son-in-law and Darice, RN, at the bedside.    Awaiting placement at SNF.    Diet Order             DIET DYS 3 Fluid consistency: Thin  Diet effective now                            Consultants: Information systems manager  Procedures: None    Medications:    (feeding supplement) PROSource Plus  30 mL Oral BID BM   busPIRone   7.5 mg Oral BID   carbidopa -levodopa   1 tablet Oral QHS   carbidopa -levodopa   1.5 tablet Oral TID   Chlorhexidine  Gluconate Cloth  6 each Topical Daily   colchicine   0.6 mg Oral Daily   cyclobenzaprine   10 mg Oral QHS   DULoxetine   60 mg Oral Daily   enoxaparin  (LOVENOX ) injection  40 mg Subcutaneous Q24H   feeding supplement  237 mL Oral TID BM   fiber supplement (BANATROL TF)  60 mL Oral BID   fluticasone   2 spray Each Nare Daily   gabapentin   400 mg Oral TID   ipratropium  2 spray Each Nare QID   midodrine   10 mg Oral TID WC   montelukast   10 mg Oral QHS   multivitamin with minerals  1 tablet Oral Daily   oxyCODONE   15 mg Oral Q12H   pantoprazole   40 mg Oral Daily   pyridostigmine   30 mg Oral Q8H   QUEtiapine   25 mg Oral QHS   sodium chloride   1 spray Each Nare Q4H   theophylline   400 mg Oral Daily   umeclidinium bromide   1 puff Inhalation Daily    Continuous Infusions:   Anti-infectives (From admission, onward)    Start     Dose/Rate Route Frequency Ordered Stop   09/03/23 1100  cefadroxil  (DURICEF) capsule 500 mg        500 mg Oral 2 times daily 09/03/23 1007 09/04/23 2144   08/31/23 2200  cefTRIAXone  (ROCEPHIN ) 1 g in sodium chloride  0.9 % 100 mL IVPB  Status:  Discontinued        1 g 200 mL/hr over 30 Minutes Intravenous Every 24 hours 08/31/23 2041 09/03/23 1007              Family Communication/Anticipated D/C date and plan/Code Status   DVT prophylaxis: Place TED hose Start: 09/02/23 1524 enoxaparin  (LOVENOX ) injection 40 mg Start: 08/30/23 2200     Code Status: Full Code  Family Communication: Plan discussed with medical, son-in-law, at the bedside Disposition Plan: Plan discharge to SNF   Status is: Inpatient Remains inpatient appropriate because: Awaiting placement  to SNF       Subjective:   Interval events noted.  She said she wants to go home.  Objective:    Vitals:   09/07/23 1709 09/07/23 1939 09/08/23 0320 09/08/23 0729  BP: 117/68 106/64 106/64 (!) 96/47  Pulse: (!) 105 (!) 110 (!) 102 81  Resp: 18 16 16 16   Temp: 98.7 F (37.1 C) 98.3 F (36.8 C) 98.2 F (36.8 C) 98.7 F (37.1 C)  TempSrc:  Oral    SpO2: 96% 97% 98% 95%  Weight:      Height:       Orthostatic VS for the past 24 hrs:  BP- Lying Pulse- Lying BP- Sitting Pulse- Sitting BP- Standing at 0 minutes Pulse- Standing at 0 minutes  09/08/23 1314 108/55 73 100/59 80 (!) 73/54 91      Intake/Output Summary (Last 24 hours) at 09/08/2023 1433 Last data filed at 09/08/2023 0700 Gross per 24 hour  Intake 0 ml  Output 950 ml  Net -950 ml    Filed Weights   08/28/23 1256  Weight: 71.7 kg    Exam:   GEN: NAD SKIN: Warm and dry EYES: No pallor or icterus ENT: MMM CV: RRR PULM: CTA B ABD: soft, ND, NT, +BS CNS: AAO x 3, non focal EXT: No edema or tenderness         Data Reviewed:   I have  personally reviewed following labs and imaging studies:  Labs: Labs show the following:   Basic Metabolic Panel: Recent Labs  Lab 09/02/23 0434 09/03/23 0506 09/04/23 0504 09/05/23 0440  NA 134* 134* 135 135  K 3.6 3.1* 3.1* 3.5  CL 105 105 107 109  CO2 22 23 24 22   GLUCOSE 188* 129* 127* 114*  BUN 15 13 12 10   CREATININE 1.29* 1.32* 1.16* 1.08*  CALCIUM  7.9* 7.3* 7.3* 7.5*  MG 2.3 1.8 1.7 1.7  PHOS 3.1 2.3* 2.6 2.2*   GFR Estimated Creatinine Clearance: 54.5 mL/min (A) (by C-G formula based on SCr of 1.08 mg/dL (H)). Liver Function Tests: Recent Labs  Lab 09/02/23 0434 09/03/23 0506 09/04/23 0504 09/05/23 0440  AST 61* 86* 88* 77*  ALT 6 6 <5 7  ALKPHOS 134* 138* 140* 144*  BILITOT 1.3* 1.3* 1.1 1.1  PROT 5.0* 4.6* 4.6* 5.1*  ALBUMIN 2.0* 1.8* 1.8* 1.8*   No results for input(s): LIPASE, AMYLASE in the last 168 hours. No results for input(s): AMMONIA in the last 168 hours. Coagulation profile No results for input(s): INR, PROTIME in the last 168 hours.  CBC: Recent Labs  Lab 09/02/23 0434 09/03/23 0506 09/04/23 0504 09/05/23 0440  WBC 6.7 7.8 8.8 7.7  NEUTROABS 5.1 5.1 6.1 4.8  HGB 9.2* 9.8* 9.8* 9.7*  HCT 26.7* 27.9* 28.3* 28.2*  MCV 89.9 90.0 89.0 91.0  PLT 227 232 235 246   Cardiac Enzymes: No results for input(s): CKTOTAL, CKMB, CKMBINDEX, TROPONINI in the last 168 hours.  BNP (last 3 results) No results for input(s): PROBNP in the last 8760 hours. CBG: No results for input(s): GLUCAP in the last 168 hours. D-Dimer: No results for input(s): DDIMER in the last 72 hours. Hgb A1c: No results for input(s): HGBA1C in the last 72 hours. Lipid Profile: No results for input(s): CHOL, HDL, LDLCALC, TRIG, CHOLHDL, LDLDIRECT in the last 72 hours. Thyroid  function studies: No results for input(s): TSH, T4TOTAL, T3FREE, THYROIDAB in the last 72 hours.  Invalid input(s): FREET3 Anemia work up: No  results for input(s):  VITAMINB12, FOLATE, FERRITIN, TIBC, IRON, RETICCTPCT in the last 72 hours. Sepsis Labs: Recent Labs  Lab 09/02/23 0434 09/03/23 0506 09/04/23 0504 09/05/23 0440  WBC 6.7 7.8 8.8 7.7    Microbiology Recent Results (from the past 240 hours)  MRSA Next Gen by PCR, Nasal     Status: Abnormal   Collection Time: 08/29/23  5:20 PM   Specimen: Nasal Mucosa; Nasal Swab  Result Value Ref Range Status   MRSA by PCR Next Gen DETECTED (A) NOT DETECTED Final    Comment: RESULT CALLED TO, READ BACK BY AND VERIFIED WITH: BYNUM, JOYYA D., LPN @2044  08/30/2023 COP (NOTE) The GeneXpert MRSA Assay (FDA approved for NASAL specimens only), is one component of a comprehensive MRSA colonization surveillance program. It is not intended to diagnose MRSA infection nor to guide or monitor treatment for MRSA infections. Test performance is not FDA approved in patients less than 47 years old. Performed at [facility], [address] [place], [place] [address]   Urine Culture (for pregnant, neutropenic or urologic patients or patients with an indwelling urinary catheter)     Status: Abnormal   Collection Time: 08/31/23  9:08 AM   Specimen: Urine, Catheterized  Result Value Ref Range Status   Specimen Description   Final    URINE, CATHETERIZED Performed at [facility], [address], [place], [place] [address]    Special Requests   Final    NONE Performed at [facility], [address], [place], [place] [address]    Culture (A)  Final    >=100,000 COLONIES/mL KLEBSIELLA PNEUMONIAE 10,000 COLONIES/mL PROTEUS MIRABILIS    Report Status 09/03/2023 FINAL  Final   Organism ID, Bacteria KLEBSIELLA PNEUMONIAE (A)  Final   Organism ID, Bacteria PROTEUS MIRABILIS (A)  Final      Susceptibility   Klebsiella pneumoniae - MIC*    AMPICILLIN RESISTANT Resistant     CEFAZOLIN <=4 SENSITIVE Sensitive     CEFEPIME  <=0.12 SENSITIVE Sensitive      CEFTRIAXONE  <=0.25 SENSITIVE Sensitive     CIPROFLOXACIN  <=0.25 SENSITIVE Sensitive     GENTAMICIN <=1 SENSITIVE Sensitive     IMIPENEM <=0.25 SENSITIVE Sensitive     NITROFURANTOIN  64 INTERMEDIATE Intermediate     TRIMETH /SULFA  <=20 SENSITIVE Sensitive     AMPICILLIN/SULBACTAM <=2 SENSITIVE Sensitive     PIP/TAZO <=4 SENSITIVE Sensitive ug/mL    * >=100,000 COLONIES/mL KLEBSIELLA PNEUMONIAE   Proteus mirabilis - MIC*    AMPICILLIN <=2 SENSITIVE Sensitive     CEFAZOLIN <=4 SENSITIVE Sensitive     CEFEPIME  <=0.12 SENSITIVE Sensitive     CEFTRIAXONE  <=0.25 SENSITIVE Sensitive     CIPROFLOXACIN  <=0.25 SENSITIVE Sensitive     GENTAMICIN <=1 SENSITIVE Sensitive     IMIPENEM 4 SENSITIVE Sensitive     NITROFURANTOIN  256 RESISTANT Resistant     TRIMETH /SULFA  <=20 SENSITIVE Sensitive     AMPICILLIN/SULBACTAM <=2 SENSITIVE Sensitive     PIP/TAZO <=4 SENSITIVE Sensitive ug/mL    * 10,000 COLONIES/mL PROTEUS MIRABILIS  Gastrointestinal Panel by PCR , Stool     Status: None   Collection Time: 09/01/23 12:50 AM   Specimen: Stool  Result Value Ref Range Status   Campylobacter species NOT DETECTED NOT DETECTED Final   Plesimonas shigelloides NOT DETECTED NOT DETECTED Final   Salmonella species NOT DETECTED NOT DETECTED Final   Yersinia enterocolitica NOT DETECTED NOT DETECTED Final   Vibrio species NOT DETECTED NOT DETECTED Final   Vibrio cholerae NOT DETECTED NOT  DETECTED Final   Enteroaggregative E coli (EAEC) NOT DETECTED NOT DETECTED Final   Enteropathogenic E coli (EPEC) NOT DETECTED NOT DETECTED Final   Enterotoxigenic E coli (ETEC) NOT DETECTED NOT DETECTED Final   Shiga like toxin producing E coli (STEC) NOT DETECTED NOT DETECTED Final   Shigella/Enteroinvasive E coli (EIEC) NOT DETECTED NOT DETECTED Final   Cryptosporidium NOT DETECTED NOT DETECTED Final   Cyclospora cayetanensis NOT DETECTED NOT DETECTED Final   Entamoeba histolytica NOT DETECTED NOT DETECTED Final   Giardia  lamblia NOT DETECTED NOT DETECTED Final   Adenovirus F40/41 NOT DETECTED NOT DETECTED Final   Astrovirus NOT DETECTED NOT DETECTED Final   Norovirus GI/GII NOT DETECTED NOT DETECTED Final   Rotavirus A NOT DETECTED NOT DETECTED Final   Sapovirus (I, II, IV, and V) NOT DETECTED NOT DETECTED Final    Comment: Performed at [facility], [address] [place], [place] [address]    Procedures and diagnostic studies:  No results found.             LOS: 10 days   Shontay Wallner  [facility]   Pager on [email or web address]. If 7PM-7AM, please contact night-coverage at [email or web address]     09/08/2023, 2:33 PM

## 2023-09-09 DIAGNOSIS — N189 Chronic kidney disease, unspecified: Secondary | ICD-10-CM | POA: Diagnosis not present

## 2023-09-09 DIAGNOSIS — N179 Acute kidney failure, unspecified: Secondary | ICD-10-CM | POA: Diagnosis not present

## 2023-09-09 MED ORDER — PYRIDOSTIGMINE BROMIDE 60 MG PO TABS
60.0000 mg | ORAL_TABLET | Freq: Three times a day (TID) | ORAL | Status: DC
  Administered 2023-09-09 – 2023-09-14 (×16): 60 mg via ORAL
  Filled 2023-09-09 (×17): qty 1

## 2023-09-09 NOTE — Progress Notes (Signed)
 Mobility Specialist - Progress Note    09/09/23 1400  Mobility  Activity Stood at bedside;Transferred to/from [facility];Ambulated with assistance in room  Level of Assistance Minimal assist, patient does 75% or more  Assistive Device Front wheel walker  Distance Ambulated (ft) 6 ft  Range of Motion/Exercises Active  Activity Response Tolerated well  Mobility Referral Yes  Mobility visit 1 Mobility  Mobility Specialist Start Time (ACUTE ONLY) 1159  Mobility Specialist Stop Time (ACUTE ONLY) 1230  Mobility Specialist Time Calculation (min) (ACUTE ONLY) 31 min   Pt resting in bed on RA upon entry. Pt STS and transfers to [facility] MinA with RW at end of bed. Pt side steps back to head and bed and STS x3 during hygiene clean. Pt returned to bed and left with needs in reach. Bed alarm activated.     Guido Rumble Mobility Specialist 09/09/23, 2:29 PM

## 2023-09-09 NOTE — Progress Notes (Signed)
 Progress Note    AYZIA DAY  FMW:[id number] DOB: [birth year]  DOA: 08/28/2023 PCP: Sowles, Krichna, MD      Brief Narrative:    Medical records reviewed and are as summarized below:  Tricia [name] is a 63 y.o. female with chronic pain, COPD, asthma, osteoarthritis, anxiety, depression, type 2 diabetes, diastolic CHF, GERD, hypertension, dyslipidemia, who presents to the ED with syncopal episode.  Initial vitals in the ED are 125/100, creatinine elevation of 2.55, CK  659.  Noncon head CT without acute intracranial abnormalities.  Patient received 2 L NS and was admitted.  Gradually her mental status has improved but she remains intermittently altered.  We have significant concerns about her ability to take care of herself at home.       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   S/p syncope: There is probably from orthostatic hypotension. S/p treatment with IV fluids. 2D echo showed preserved EF, grade 1 diastolic dysfunction.   Recurrent orthostatic hypotension: This may be chronic.  Risk factors for this include polypharmacy, diabetes mellitus and Parkinson's disease. She still orthostatic. Continue midodrine  and pyridostigmine .  Increase pyridostigmine  from 30 mg 3 times daily to 60 mg 3 times daily to see if it will make any  difference.   Polypharmacy, chronic pain, opioid dependence: - Patient is on multiple psychiatric medications, duplicate antihistamine therapy, as well as chronic opioid therapy - Home meds include: BuSpar , Cymbalta , Flexeril , Xyzal , gabapentin , morphine , MS Contin , Seroquel , hydroxyzine . - Hydroxyzine  and immediate release morphine  have already been discontinued.    - Continue OxyContin  for now.   Outpatient follow-up with pain clinic for de-escalation of opioids strongly recommended.   Urinary retention: Foley catheter placed after multiple In-N-Out ureteral catheterization.  Failed voiding trial on 09/01/2023.  She was evaluated by the urologist on 09/02/2023.  Foley catheter has been replaced.  It was recommended that patient be discharged on Foley catheter.  Outpatient follow-up with urologist for voiding trial.   Acute UTI: S/p treatment with IV ceftriaxone  followed by PO cefadroxil . Urine culture from 08/31/2023 showed Klebsiella pneumoniae and Proteus mirabilis.   AKI, probable CKD stage IIIa: Creatinine is stable. Elevated CK: Does not meet criteria for rhabdomyolysis.  CK improved from 659-547. Hypokalemia: Improved Hypomagnesemia: Improved Hypophosphatemia: Replete with potassium phosphate    Diarrhea: Imodium  as needed.  No indication for C. difficile testing at this time.   Comorbidities include COPD, Parkinson's disease on carbidopa -levodopa , anxiety, depression, type II DM, GERD, moderate malnutrition, hypoalbuminemia   General Weakness: PT and OT recommended discharge to SNF.  Follow-up with TOC to assist with disposition.    Awaiting placement at SNF.    Diet Order             DIET DYS 3 Fluid consistency: Thin  Diet effective now  Consultants: Information systems manager  Procedures: None    Medications:    (feeding supplement) PROSource Plus  30 mL Oral BID BM   busPIRone   7.5 mg Oral BID   carbidopa -levodopa    1 tablet Oral QHS   carbidopa -levodopa   1.5 tablet Oral TID   Chlorhexidine  Gluconate Cloth  6 each Topical Daily   colchicine   0.6 mg Oral Daily   cyclobenzaprine   10 mg Oral QHS   DULoxetine   60 mg Oral Daily   enoxaparin  (LOVENOX ) injection  40 mg Subcutaneous Q24H   feeding supplement  237 mL Oral TID BM   fiber supplement (BANATROL TF)  60 mL Oral BID   fluticasone   2 spray Each Nare Daily   gabapentin   400 mg Oral TID   ipratropium  2 spray Each Nare QID   midodrine   10 mg Oral TID WC   montelukast   10 mg Oral QHS   multivitamin with minerals  1 tablet Oral Daily   oxyCODONE   15 mg Oral Q12H   pantoprazole   40 mg Oral Daily   pyridostigmine   60 mg Oral Q8H   QUEtiapine   25 mg Oral QHS   sodium chloride   1 spray Each Nare Q4H   theophylline   400 mg Oral Daily   umeclidinium bromide   1 puff Inhalation Daily   Continuous Infusions:   Anti-infectives (From admission, onward)    Start     Dose/Rate Route Frequency Ordered Stop   09/03/23 1100  cefadroxil  (DURICEF) capsule 500 mg        500 mg Oral 2 times daily 09/03/23 1007 09/04/23 2144   08/31/23 2200  cefTRIAXone  (ROCEPHIN ) 1 g in sodium chloride  0.9 % 100 mL IVPB  Status:  Discontinued        1 g 200 mL/hr over 30 Minutes Intravenous Every 24 hours 08/31/23 2041 09/03/23 1007              Family Communication/Anticipated D/C date and plan/Code Status   DVT prophylaxis: Place TED hose Start: 09/02/23 1524 enoxaparin  (LOVENOX ) injection 40 mg Start: 08/30/23 2200     Code Status: Full Code  Family Communication: None  Disposition Plan: Plan discharge to SNF   Status is: Inpatient Remains inpatient appropriate because: Awaiting placement to SNF       Subjective:   Interval events noted.  No complaints.  She was asking about discharge plans.  Objective:    Vitals:   09/08/23 2032 09/08/23 2104 09/09/23 0343 09/09/23 0816  BP: 126/61 119/63 (!) 109/56 120/63  Pulse: 89 86 79 73  Resp: 16  18 18 16   Temp: 98 F (36.7 C) 98.1 F (36.7 C) 97.8 F (36.6 C) 98.7 F (37.1 C)  TempSrc: Oral   Oral  SpO2: 96% 96% 95% 97%  Weight:      Height:       Orthostatic VS for the past 24 hrs:  BP- Lying Pulse- Lying BP- Sitting Pulse- Sitting BP- Standing at 0 minutes Pulse- Standing at 0 minutes  09/09/23 0816 120/63 78 97/55 94 (!) 76/44 103  09/08/23 1314 108/55 73 100/59 80 (!) 73/54 91      Intake/Output Summary (Last 24 hours) at 09/09/2023 1102 Last data filed at 09/09/2023 0911 Gross per 24 hour  Intake 0 ml  Output 600 ml  Net -600 ml    Filed Weights   08/28/23 1256  Weight: 71.7 kg    Exam:   GEN: NAD SKIN: Warm and dry EYES: No pallor or  icterus ENT: MMM CV: RRR PULM: CTA B ABD: soft, ND, NT, +BS CNS: AAO x 3, non focal EXT: No edema or tenderness         Data Reviewed:   I have personally reviewed following labs and imaging studies:  Labs: Labs show the following:   Basic Metabolic Panel: Recent Labs  Lab 09/03/23 0506 09/04/23 0504 09/05/23 0440  NA 134* 135 135  K 3.1* 3.1* 3.5  CL 105 107 109  CO2 23 24 22   GLUCOSE 129* 127* 114*  BUN 13 12 10   CREATININE 1.32* 1.16* 1.08*  CALCIUM  7.3* 7.3* 7.5*  MG 1.8 1.7 1.7  PHOS 2.3* 2.6 2.2*   GFR Estimated Creatinine Clearance: 54.5 mL/min (A) (by C-G formula based on SCr of 1.08 mg/dL (H)). Liver Function Tests: Recent Labs  Lab 09/03/23 0506 09/04/23 0504 09/05/23 0440  AST 86* 88* 77*  ALT 6 <5 7  ALKPHOS 138* 140* 144*  BILITOT 1.3* 1.1 1.1  PROT 4.6* 4.6* 5.1*  ALBUMIN 1.8* 1.8* 1.8*   No results for input(s): LIPASE, AMYLASE in the last 168 hours. No results for input(s): AMMONIA in the last 168 hours. Coagulation profile No results for input(s): INR, PROTIME in the last 168 hours.  CBC: Recent Labs  Lab 09/03/23 0506 09/04/23 0504 09/05/23 0440  WBC 7.8 8.8 7.7  NEUTROABS 5.1 6.1 4.8  HGB 9.8* 9.8* 9.7*  HCT 27.9* 28.3* 28.2*  MCV 90.0 89.0  91.0  PLT 232 235 246   Cardiac Enzymes: No results for input(s): CKTOTAL, CKMB, CKMBINDEX, TROPONINI in the last 168 hours.  BNP (last 3 results) No results for input(s): PROBNP in the last 8760 hours. CBG: No results for input(s): GLUCAP in the last 168 hours. D-Dimer: No results for input(s): DDIMER in the last 72 hours. Hgb A1c: No results for input(s): HGBA1C in the last 72 hours. Lipid Profile: No results for input(s): CHOL, HDL, LDLCALC, TRIG, CHOLHDL, LDLDIRECT in the last 72 hours. Thyroid  function studies: No results for input(s): TSH, T4TOTAL, T3FREE, THYROIDAB in the last 72 hours.  Invalid input(s): FREET3 Anemia work up: No results for input(s): VITAMINB12, FOLATE, FERRITIN, TIBC, IRON, RETICCTPCT in the last 72 hours. Sepsis Labs: Recent Labs  Lab 09/03/23 0506 09/04/23 0504 09/05/23 0440  WBC 7.8 8.8 7.7    Microbiology Recent Results (from the past 240 hours)  Urine Culture (for pregnant, neutropenic or urologic patients or patients with an indwelling urinary catheter)     Status: Abnormal   Collection Time: 08/31/23  9:08 AM   Specimen: Urine, Catheterized  Result Value Ref Range Status   Specimen Description   Final    URINE, CATHETERIZED Performed at [facility], [address], [place], [place] [address]    Special Requests   Final    NONE Performed at [facility], [address] [place], [place] [address]    Culture (A)  Final    >=100,000 COLONIES/mL KLEBSIELLA PNEUMONIAE 10,000 COLONIES/mL PROTEUS MIRABILIS    Report Status 09/03/2023 FINAL  Final   Organism ID, Bacteria KLEBSIELLA PNEUMONIAE (A)  Final   Organism ID, Bacteria PROTEUS MIRABILIS (A)  Final      Susceptibility   Klebsiella pneumoniae - MIC*    AMPICILLIN RESISTANT Resistant     CEFAZOLIN <=4 SENSITIVE Sensitive     CEFEPIME  <=0.12 SENSITIVE Sensitive     CEFTRIAXONE  <=0.25 SENSITIVE Sensitive      CIPROFLOXACIN  <=0.25 SENSITIVE Sensitive     GENTAMICIN <=1 SENSITIVE  Sensitive     IMIPENEM <=0.25 SENSITIVE Sensitive     NITROFURANTOIN  64 INTERMEDIATE Intermediate     TRIMETH /SULFA  <=20 SENSITIVE Sensitive     AMPICILLIN/SULBACTAM <=2 SENSITIVE Sensitive     PIP/TAZO <=4 SENSITIVE Sensitive ug/mL    * >=100,000 COLONIES/mL KLEBSIELLA PNEUMONIAE   Proteus mirabilis - MIC*    AMPICILLIN <=2 SENSITIVE Sensitive     CEFAZOLIN <=4 SENSITIVE Sensitive     CEFEPIME  <=0.12 SENSITIVE Sensitive     CEFTRIAXONE  <=0.25 SENSITIVE Sensitive     CIPROFLOXACIN  <=0.25 SENSITIVE Sensitive     GENTAMICIN <=1 SENSITIVE Sensitive     IMIPENEM 4 SENSITIVE Sensitive     NITROFURANTOIN  256 RESISTANT Resistant     TRIMETH /SULFA  <=20 SENSITIVE Sensitive     AMPICILLIN/SULBACTAM <=2 SENSITIVE Sensitive     PIP/TAZO <=4 SENSITIVE Sensitive ug/mL    * 10,000 COLONIES/mL PROTEUS MIRABILIS  Gastrointestinal Panel by PCR , Stool     Status: None   Collection Time: 09/01/23 12:50 AM   Specimen: Stool  Result Value Ref Range Status   Campylobacter species NOT DETECTED NOT DETECTED Final   Plesimonas shigelloides NOT DETECTED NOT DETECTED Final   Salmonella species NOT DETECTED NOT DETECTED Final   Yersinia enterocolitica NOT DETECTED NOT DETECTED Final   Vibrio species NOT DETECTED NOT DETECTED Final   Vibrio cholerae NOT DETECTED NOT DETECTED Final   Enteroaggregative E coli (EAEC) NOT DETECTED NOT DETECTED Final   Enteropathogenic E coli (EPEC) NOT DETECTED NOT DETECTED Final   Enterotoxigenic E coli (ETEC) NOT DETECTED NOT DETECTED Final   Shiga like toxin producing E coli (STEC) NOT DETECTED NOT DETECTED Final   Shigella/Enteroinvasive E coli (EIEC) NOT DETECTED NOT DETECTED Final   Cryptosporidium NOT DETECTED NOT DETECTED Final   Cyclospora cayetanensis NOT DETECTED NOT DETECTED Final   Entamoeba histolytica NOT DETECTED NOT DETECTED Final   Giardia lamblia NOT DETECTED NOT DETECTED Final    Adenovirus F40/41 NOT DETECTED NOT DETECTED Final   Astrovirus NOT DETECTED NOT DETECTED Final   Norovirus GI/GII NOT DETECTED NOT DETECTED Final   Rotavirus A NOT DETECTED NOT DETECTED Final   Sapovirus (I, II, IV, and V) NOT DETECTED NOT DETECTED Final    Comment: Performed at [facility], [address] [place], [place] [address]    Procedures and diagnostic studies:  No results found.             LOS: 11 days   Chou Busler  [employer] loss adjuster on [email or web address]. If 7PM-7AM, please contact night-coverage at [email or web address]     09/09/2023, 11:02 AM

## 2023-09-09 NOTE — Progress Notes (Signed)
 PT Cancellation Note  Patient Details Name: LANIYAH ROSENWALD MRN: [id number] DOB: 16-Sep-1960   Cancelled Treatment:    Reason Eval/Treat Not Completed: Patient declined to participate with PT services secondary to nausea, nursing aware/notified.  Offered bed level therex with pt again declining to participate.  Will attempt to see pt at a future date/time as medically appropriate.      CHARM [name] PT, DPT 09/09/23, 4:55 PM

## 2023-09-10 DIAGNOSIS — N179 Acute kidney failure, unspecified: Secondary | ICD-10-CM | POA: Diagnosis not present

## 2023-09-10 DIAGNOSIS — N189 Chronic kidney disease, unspecified: Secondary | ICD-10-CM | POA: Diagnosis not present

## 2023-09-10 MED ORDER — SODIUM CHLORIDE 0.9 % IV SOLN
12.5000 mg | Freq: Four times a day (QID) | INTRAVENOUS | Status: DC | PRN
  Administered 2023-09-10 – 2023-09-17 (×5): 12.5 mg via INTRAVENOUS
  Filled 2023-09-10: qty 0.5
  Filled 2023-09-10: qty 12.5
  Filled 2023-09-10: qty 0.5
  Filled 2023-09-10: qty 12.5
  Filled 2023-09-10 (×2): qty 0.5

## 2023-09-10 NOTE — Progress Notes (Signed)
 Progress Note    Tricia [name]  FMW:[id number] DOB: [birth year]  DOA: 08/28/2023 PCP: Sowles, Krichna, MD      Brief Narrative:    Medical records reviewed and are as summarized below:  Tricia [name] is a 63 y.o. female with chronic pain, COPD, asthma, osteoarthritis, anxiety, depression, type 2 diabetes, diastolic CHF, GERD, hypertension, dyslipidemia, who presents to the ED with syncopal episode.  Initial vitals in the ED are 125/100, creatinine elevation of 2.55, CK  659.  Noncon head CT without acute intracranial abnormalities.  Patient received 2 L NS and was admitted.  Gradually her mental status has improved but she remains intermittently altered.  We have significant concerns about her ability to take care of herself at home.       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   S/p syncope: There is probably from orthostatic hypotension. S/p treatment with IV fluids. 2D echo showed preserved EF, grade 1 diastolic dysfunction.   Recurrent orthostatic hypotension: This may be chronic.  Risk factors for this include polypharmacy, diabetes mellitus and Parkinson's disease. She still orthostatic. Current midodrine  and pyridostigmine .    Polypharmacy, chronic pain, opioid dependence: - Patient is on multiple psychiatric medications, duplicate  antihistamine therapy, as well as chronic opioid therapy - Home meds include: BuSpar , Cymbalta , Flexeril , Xyzal , gabapentin , morphine , MS Contin , Seroquel , hydroxyzine . - Hydroxyzine  and immediate release morphine  have already been discontinued.    - Continue OxyContin  for now.   Outpatient follow-up with pain clinic for de-escalation of opioids strongly recommended.   Urinary retention: Foley catheter placed after multiple In-N-Out ureteral catheterization.  Failed voiding trial on 09/01/2023.  She was evaluated by the urologist on 09/02/2023.  Foley catheter has been replaced.  It was recommended that patient be discharged on Foley catheter.  Outpatient follow-up with urologist for voiding trial.   Acute UTI: S/p treatment with IV ceftriaxone  followed by PO cefadroxil . Urine culture from 08/31/2023 showed Klebsiella pneumoniae and Proteus mirabilis.   AKI, probable CKD stage IIIa: Creatinine is stable. Elevated CK: Does not meet criteria for rhabdomyolysis.  CK improved from 659-547. Hypokalemia: Improved Hypomagnesemia: Improved Hypophosphatemia: Replete with potassium phosphate    Diarrhea: Imodium  as needed.  No indication for C. difficile testing at this time.   Comorbidities include COPD, Parkinson's disease on carbidopa -levodopa , anxiety, depression, type II DM, GERD, moderate malnutrition, hypoalbuminemia   General Weakness: PT and OT recommended discharge to SNF.  Follow-up with TOC to assist with disposition.  I was informed by Tricia [name], that his son-in-law was here today but he said he would not take the patient home.   Awaiting placement at SNF.    Diet Order             DIET DYS 3 Fluid consistency: Thin  Diet effective now  Consultants: Information systems manager  Procedures: None    Medications:    (feeding supplement) PROSource Plus  30 mL Oral BID BM   busPIRone   7.5 mg Oral BID   carbidopa -levodopa   1 tablet  Oral QHS   carbidopa -levodopa   1.5 tablet Oral TID   Chlorhexidine  Gluconate Cloth  6 each Topical Daily   colchicine   0.6 mg Oral Daily   cyclobenzaprine   10 mg Oral QHS   DULoxetine   60 mg Oral Daily   enoxaparin  (LOVENOX ) injection  40 mg Subcutaneous Q24H   feeding supplement  237 mL Oral TID BM   fiber supplement (BANATROL TF)  60 mL Oral BID   fluticasone   2 spray Each Nare Daily   gabapentin   400 mg Oral TID   ipratropium  2 spray Each Nare QID   midodrine   10 mg Oral TID WC   montelukast   10 mg Oral QHS   multivitamin with minerals  1 tablet Oral Daily   oxyCODONE   15 mg Oral Q12H   pantoprazole   40 mg Oral Daily   pyridostigmine   60 mg Oral Q8H   QUEtiapine   25 mg Oral QHS   sodium chloride   1 spray Each Nare Q4H   theophylline   400 mg Oral Daily   umeclidinium bromide   1 puff Inhalation Daily   Continuous Infusions:  promethazine  (PHENERGAN ) injection (IM or IVPB) 12.5 mg (09/10/23 0112)     Anti-infectives (From admission, onward)    Start     Dose/Rate Route Frequency Ordered Stop   09/03/23 1100  cefadroxil  (DURICEF) capsule 500 mg        500 mg Oral 2 times daily 09/03/23 1007 09/04/23 2144   08/31/23 2200  cefTRIAXone  (ROCEPHIN ) 1 g in sodium chloride  0.9 % 100 mL IVPB  Status:  Discontinued        1 g 200 mL/hr over 30 Minutes Intravenous Every 24 hours 08/31/23 2041 09/03/23 1007              Family Communication/Anticipated D/C date and plan/Code Status   DVT prophylaxis: Place TED hose Start: 09/02/23 1524 enoxaparin  (LOVENOX ) injection 40 mg Start: 08/30/23 2200     Code Status: Full Code  Family Communication: None  Disposition Plan: Plan discharge to SNF   Status is: Inpatient Remains inpatient appropriate because: Awaiting placement to SNF       Subjective:   Interval events noted.  She says she wants to go home and her son-in-law will pick her up today.  No other complaints.  No dizziness.  Objective:    Vitals:    09/09/23 2040 09/10/23 0506 09/10/23 0805 09/10/23 1149  BP: 129/71 111/64 121/62 (!) 121/54  Pulse: (!) 102 (!) 106 99 96  Resp: 18 17 16 16   Temp: 98.1 F (36.7 C) 97.8 F (36.6 C) 98.3 F (36.8 C) 98 F (36.7 C)  TempSrc: Oral Oral Oral Oral  SpO2: 96% 96% 97% 97%  Weight:      Height:       Orthostatic VS for the past 24 hrs:  BP- Lying Pulse- Lying BP- Sitting Pulse- Sitting BP- Standing at 0 minutes Pulse- Standing at 0 minutes  09/10/23 0805 121/62 78 103/62 73 (!) 70/51 59      Intake/Output Summary (Last 24 hours) at 09/10/2023 1628 Last data filed at 09/10/2023 1300 Gross per 24 hour  Intake 475.25 ml  Output 775 ml  Net -299.75 ml    Filed Weights   08/28/23 1256  Weight:  71.7 kg    Exam:  GEN: NAD SKIN: Warm and dry EYES: No pallor or icterus ENT: MMM CV: RRR PULM: CTA B ABD: soft, ND, NT, +BS CNS: AAO x 3, non focal EXT: No edema or tenderness       Data Reviewed:   I have personally reviewed following labs and imaging studies:  Labs: Labs show the following:   Basic Metabolic Panel: Recent Labs  Lab 09/04/23 0504 09/05/23 0440  NA 135 135  K 3.1* 3.5  CL 107 109  CO2 24 22  GLUCOSE 127* 114*  BUN 12 10  CREATININE 1.16* 1.08*  CALCIUM  7.3* 7.5*  MG 1.7 1.7  PHOS 2.6 2.2*   GFR Estimated Creatinine Clearance: 54.5 mL/min (A) (by C-G formula based on SCr of 1.08 mg/dL (H)). Liver Function Tests: Recent Labs  Lab 09/04/23 0504 09/05/23 0440  AST 88* 77*  ALT <5 7  ALKPHOS 140* 144*  BILITOT 1.1 1.1  PROT 4.6* 5.1*  ALBUMIN 1.8* 1.8*   No results for input(s): LIPASE, AMYLASE in the last 168 hours. No results for input(s): AMMONIA in the last 168 hours. Coagulation profile No results for input(s): INR, PROTIME in the last 168 hours.  CBC: Recent Labs  Lab 09/04/23 0504 09/05/23 0440  WBC 8.8 7.7  NEUTROABS 6.1 4.8  HGB 9.8* 9.7*  HCT 28.3* 28.2*  MCV 89.0 91.0  PLT 235 246   Cardiac Enzymes: No  results for input(s): CKTOTAL, CKMB, CKMBINDEX, TROPONINI in the last 168 hours.  BNP (last 3 results) No results for input(s): PROBNP in the last 8760 hours. CBG: No results for input(s): GLUCAP in the last 168 hours. D-Dimer: No results for input(s): DDIMER in the last 72 hours. Hgb A1c: No results for input(s): HGBA1C in the last 72 hours. Lipid Profile: No results for input(s): CHOL, HDL, LDLCALC, TRIG, CHOLHDL, LDLDIRECT in the last 72 hours. Thyroid  function studies: No results for input(s): TSH, T4TOTAL, T3FREE, THYROIDAB in the last 72 hours.  Invalid input(s): FREET3 Anemia work up: No results for input(s): VITAMINB12, FOLATE, FERRITIN, TIBC, IRON, RETICCTPCT in the last 72 hours. Sepsis Labs: Recent Labs  Lab 09/04/23 0504 09/05/23 0440  WBC 8.8 7.7    Microbiology Recent Results (from the past 240 hours)  Gastrointestinal Panel by PCR , Stool     Status: None   Collection Time: 09/01/23 12:50 AM   Specimen: Stool  Result Value Ref Range Status   Campylobacter species NOT DETECTED NOT DETECTED Final   Plesimonas shigelloides NOT DETECTED NOT DETECTED Final   Salmonella species NOT DETECTED NOT DETECTED Final   Yersinia enterocolitica NOT DETECTED NOT DETECTED Final   Vibrio species NOT DETECTED NOT DETECTED Final   Vibrio cholerae NOT DETECTED NOT DETECTED Final   Enteroaggregative E coli (EAEC) NOT DETECTED NOT DETECTED Final   Enteropathogenic E coli (EPEC) NOT DETECTED NOT DETECTED Final   Enterotoxigenic E coli (ETEC) NOT DETECTED NOT DETECTED Final   Shiga like toxin producing E coli (STEC) NOT DETECTED NOT DETECTED Final   Shigella/Enteroinvasive E coli (EIEC) NOT DETECTED NOT DETECTED Final   Cryptosporidium NOT DETECTED NOT DETECTED Final   Cyclospora cayetanensis NOT DETECTED NOT DETECTED Final   Entamoeba histolytica NOT DETECTED NOT DETECTED Final   Giardia lamblia NOT DETECTED NOT DETECTED Final    Adenovirus F40/41 NOT DETECTED NOT DETECTED Final   Astrovirus NOT DETECTED NOT DETECTED Final   Norovirus GI/GII NOT DETECTED NOT DETECTED Final   Rotavirus A NOT DETECTED  NOT DETECTED Final   Sapovirus (I, II, IV, and V) NOT DETECTED NOT DETECTED Final    Comment: Performed at [facility], [address] [place], [place] [address]    Procedures and diagnostic studies:  No results found.             LOS: 12 days   Jamel Holzmann  [employer] loss adjuster on [email or web address]. If 7PM-7AM, please contact night-coverage at [email or web address]     09/10/2023, 4:28 PM

## 2023-09-10 NOTE — Progress Notes (Signed)
 PT Cancellation Note  Patient Details Name: Tricia [name] MRN: [id number] DOB: [birth year]   Cancelled Treatment:    Reason Eval/Treat Not Completed: Patient declined to participate with PT services secondary to nausea, nursing aware.  Offered bed level therex with pt again declining to participate.  Will attempt to see pt at a future date/time as medically appropriate.    CHARM [name] PT, DPT 09/10/23, 4:33 PM

## 2023-09-10 NOTE — Progress Notes (Signed)
 Occupational Therapy Treatment Patient Details Name: Tricia [name] MRN: [id number] DOB: [birth year] Today's Date: 09/10/2023   History of present illness Pt is a 63 y.o. female admitted with acute kidney injury superimposed on CKD, UTI, orthostatic hypotension with syncope, and rhabdomyolysis.  PMHx includes: COPD, Asthma, osteoarthritis, anxiety, depression, TypeII DM, Diastolic Dysfunction, GERD, HTN, Dyslipidemia, Rhabdomyolitis 2/2 fall, Parkinson's Disease, and syncope.   OT comments  Upon entering the room, pt in side lying position in bed and reports abdominal cramping and need for toileting. Supine >sit with min guard and stand pivot transfer from bed > BSC  for toileting needs. Pt then reports nausea and RN called for medication per patient's request. Pt voids and needs assistance with hygiene and then returns to bed in same manner as above secondary to feeling unwell. Pt returns to R side lying position for comfort. Call bell and all needed items within reach upon exiting the room.       If plan is discharge home, recommend the following:  A little help with walking and/or transfers;A lot of help with bathing/dressing/bathroom;Direct supervision/assist for medications management;Supervision due to cognitive status;Direct supervision/assist for financial management;Assistance with cooking/housework;Assist for transportation;Help with stairs or ramp for entrance   Equipment Recommendations  Other (comment) (defer to next venue of care)       Precautions / Restrictions Precautions Precautions: Fall Recall of Precautions/Restrictions: Impaired Restrictions Other Position/Activity Restrictions: Orthostatic Hypotension       Mobility Bed Mobility Overal bed mobility: Needs Assistance Bed Mobility: Rolling Rolling: Contact guard assist   Supine to sit: Contact guard Sit to supine: Contact guard assist        Transfers Overall transfer level: Needs  assistance Equipment used: 1 person hand held assist Transfers: Sit to/from Stand, Bed to chair/wheelchair/BSC Sit to Stand: Min assist     Step pivot transfers: Min assist           Balance Overall balance assessment: Needs assistance Sitting-balance support: Feet supported Sitting balance-Leahy Scale: Good     Standing balance support: Bilateral upper extremity supported, During functional activity, Reliant on assistive device for balance Standing balance-Leahy Scale: Fair                             ADL either performed or assessed with clinical judgement   ADL Overall ADL's : Needs assistance/impaired                         Toilet Transfer: Contact guard assist;Stand-pivot;BSC/3in1;Minimal assistance   Toileting- Clothing Manipulation and Hygiene: Moderate assistance              Extremity/Trunk Assessment Upper Extremity Assessment Upper Extremity Assessment: Generalized weakness   Lower Extremity Assessment Lower Extremity Assessment: Generalized weakness        Vision Patient Visual Report: No change from baseline           Communication Communication Communication: No apparent difficulties   Cognition Arousal: Alert Behavior During Therapy: WFL for tasks assessed/performed Cognition: No family/caregiver present to determine baseline                               Following commands: Intact Following commands impaired: Only follows one step commands consistently, Follows one step commands with increased time      Cueing   Cueing Techniques: Verbal cues  Pertinent Vitals/ Pain       Pain Assessment Pain Assessment: Faces Faces Pain Scale: Hurts even more Pain Location: abdominal cramping Pain Descriptors / Indicators: Sore, Discomfort Pain Intervention(s): Monitored during session, Limited activity within patient's tolerance, Patient requesting pain meds-RN notified         Frequency   Min 2X/week        Progress Toward Goals  OT Goals(current goals can now be found in the care plan section)  Progress towards OT goals: Progressing toward goals      AM-PAC OT 6 Clicks Daily Activity     Outcome Measure   Help from another person eating meals?: None Help from another person taking care of personal grooming?: None Help from another person toileting, which includes using toliet, bedpan, or urinal?: A Lot Help from another person bathing (including washing, rinsing, drying)?: A Lot Help from another person to put on and taking off regular upper body clothing?: A Little Help from another person to put on and taking off regular lower body clothing?: A Lot 6 Click Score: 17    End of Session Equipment Utilized During Treatment: Rolling walker (2 wheels)  OT Visit Diagnosis: Unsteadiness on feet (R26.81);Muscle weakness (generalized) (M62.81);History of falling (Z91.81)   Activity Tolerance Patient tolerated treatment well   Patient Left with call bell/phone within reach;in chair;with chair alarm set   Nurse Communication Mobility status        Time: 8490-8474 OT Time Calculation (min): 16 min  Charges: OT General Charges $OT Visit: 1 Visit OT Treatments $Self Care/Home Management : 8-22 mins  Izetta Claude, MS, OTR/L , CBIS ascom [phone]  09/10/23, 3:36 PM

## 2023-09-10 NOTE — Plan of Care (Signed)
  Problem: Education: Goal: Knowledge of General Education information will improve Description: Including pain rating scale, medication(s)/side effects and non-pharmacologic comfort measures Outcome: Progressing   Problem: Health Behavior/Discharge Planning: Goal: Ability to manage health-related needs will improve Outcome: Progressing   Problem: Clinical Measurements: Goal: Respiratory complications will improve Outcome: Progressing   Problem: Nutrition: Goal: Adequate nutrition will be maintained Outcome: Progressing   Problem: Coping: Goal: Level of anxiety will decrease Outcome: Progressing   Problem: Elimination: Goal: Will not experience complications related to bowel motility Outcome: Progressing Goal: Will not experience complications related to urinary retention Outcome: Progressing   Problem: Pain Managment: Goal: General experience of comfort will improve and/or be controlled Outcome: Progressing   Problem: Safety: Goal: Ability to remain free from injury will improve Outcome: Progressing   Problem: Skin Integrity: Goal: Risk for impaired skin integrity will decrease Outcome: Progressing

## 2023-09-11 DIAGNOSIS — R748 Abnormal levels of other serum enzymes: Secondary | ICD-10-CM

## 2023-09-11 DIAGNOSIS — R55 Syncope and collapse: Secondary | ICD-10-CM | POA: Diagnosis not present

## 2023-09-11 DIAGNOSIS — F32A Depression, unspecified: Secondary | ICD-10-CM

## 2023-09-11 DIAGNOSIS — N179 Acute kidney failure, unspecified: Secondary | ICD-10-CM | POA: Diagnosis not present

## 2023-09-11 DIAGNOSIS — E440 Moderate protein-calorie malnutrition: Secondary | ICD-10-CM

## 2023-09-11 DIAGNOSIS — E785 Hyperlipidemia, unspecified: Secondary | ICD-10-CM | POA: Diagnosis not present

## 2023-09-11 DIAGNOSIS — F419 Anxiety disorder, unspecified: Secondary | ICD-10-CM

## 2023-09-11 DIAGNOSIS — G20A1 Parkinson's disease without dyskinesia, without mention of fluctuations: Secondary | ICD-10-CM

## 2023-09-11 DIAGNOSIS — J449 Chronic obstructive pulmonary disease, unspecified: Secondary | ICD-10-CM

## 2023-09-11 LAB — CREATININE, SERUM
Creatinine, Ser: 0.95 mg/dL (ref 0.44–1.00)
GFR, Estimated: >60 mL/min (ref >60)

## 2023-09-11 MED ORDER — VITAMIN C 500 MG PO TABS
250.0000 mg | ORAL_TABLET | Freq: Two times a day (BID) | ORAL | Status: DC
  Administered 2023-09-11 – 2023-09-17 (×12): 250 mg via ORAL
  Filled 2023-09-11 (×12): qty 1

## 2023-09-11 NOTE — Progress Notes (Signed)
 Occupational Therapy Treatment Patient Details Name: Tricia [name] MRN: [id number] DOB: 04/23/1960 Today's Date: 09/11/2023   History of present illness Pt is a 63 y.o. female admitted with acute kidney injury superimposed on CKD, UTI, orthostatic hypotension with syncope, and rhabdomyolysis.  PMHx includes: COPD, Asthma, osteoarthritis, anxiety, depression, TypeII DM, Diastolic Dysfunction, GERD, HTN, Dyslipidemia, Rhabdomyolitis 2/2 fall, Parkinson's Disease, and syncope.   OT comments  Pt participates in session with encouragement. Performs bed mobility with supervision, grooming tasks while seated EOB with setup to brush hair and wash face, CGA using RW for STS and step pivot t/f BSC for attempted BM. Pt unable to void, and returns to supine. Discharge recommendation remains appropriate, OT will continue to follow.       If plan is discharge home, recommend the following:  A little help with walking and/or transfers;A lot of help with bathing/dressing/bathroom;Direct supervision/assist for medications management;Supervision due to cognitive status;Direct supervision/assist for financial management;Assistance with cooking/housework;Assist for transportation;Help with stairs or ramp for entrance   Equipment Recommendations  Other (comment)       Precautions / Restrictions Precautions Precautions: Fall Recall of Precautions/Restrictions: Impaired Restrictions Weight Bearing Restrictions Per Provider Order: No Other Position/Activity Restrictions: Orthostatic Hypotension       Mobility Bed Mobility Overal bed mobility: Needs Assistance Bed Mobility: Rolling Rolling: Supervision   Supine to sit: Supervision Sit to supine: Supervision   General bed mobility comments: reaches EOB without physical assist    Transfers Overall transfer level: Needs assistance Equipment used: Rolling walker (2 wheels) Transfers: Sit to/from Stand, Bed to chair/wheelchair/BSC Sit to Stand:  Contact guard assist     Step pivot transfers: Contact guard assist           Balance Overall balance assessment: Needs assistance Sitting-balance support: Feet supported Sitting balance-Leahy Scale: Good Sitting balance - Comments: no LOB while combing hair   Standing balance support: Bilateral upper extremity supported, During functional activity, Reliant on assistive device for balance Standing balance-Leahy Scale: Fair Standing balance comment: RW and CGA                           ADL either performed or assessed with clinical judgement   ADL Overall ADL's : Needs assistance/impaired     Grooming: Wash/dry hands;Wash/dry face;Brushing hair;Sitting Grooming Details (indicate cue type and reason): while EOB, standby assist for safety                 Toilet Transfer: Contact guard Public relations account executive Details (indicate cue type and reason): t/f BSC to attempt BM   Toileting - Clothing Manipulation Details (indicate cue type and reason): not completed, unsuccessful BM attempt on [facility]       General ADL Comments: participated in seated ADLs with encourgement, t/f BSC for attempted void     Communication Communication Communication: No apparent difficulties   Cognition Arousal: Alert Behavior During Therapy: WFL for tasks assessed/performed Cognition: No family/caregiver present to determine baseline                               Following commands: Intact Following commands impaired: Only follows one step commands consistently, Follows one step commands with increased time      Cueing   Cueing Techniques: Verbal cues             Pertinent Vitals/ Pain       Pain Assessment Pain  Assessment: Faces Faces Pain Scale: Hurts a little bit Pain Location: stomach , LBP Pain Descriptors / Indicators: Sore, Discomfort Pain Intervention(s): Limited activity within patient's tolerance, Monitored during session,  Repositioned   Frequency  Min 2X/week        Progress Toward Goals  OT Goals(current goals can now be found in the care plan section)     Acute Rehab OT Goals OT Goal Formulation: With patient Time For Goal Achievement: 09/14/23 Potential to Achieve Goals: Good  Plan         AM-PAC OT 6 Clicks Daily Activity     Outcome Measure   Help from another person eating meals?: None Help from another person taking care of personal grooming?: None Help from another person toileting, which includes using toliet, bedpan, or urinal?: A Lot Help from another person bathing (including washing, rinsing, drying)?: A Lot Help from another person to put on and taking off regular upper body clothing?: A Little Help from another person to put on and taking off regular lower body clothing?: A Lot 6 Click Score: 17    End of Session Equipment Utilized During Treatment: Rolling walker (2 wheels)  OT Visit Diagnosis: Unsteadiness on feet (R26.81);Muscle weakness (generalized) (M62.81);History of falling (Z91.81)   Activity Tolerance Patient tolerated treatment well   Patient Left in bed;with call bell/phone within reach;with bed alarm set   Nurse Communication Mobility status        Time: 8798-8779 OT Time Calculation (min): 19 min  Charges: OT General Charges $OT Visit: 1 Visit OT Treatments $Self Care/Home Management : 8-22 mins  Jester Klingberg L. Clarke Peretz, OTR/L  09/11/23, 12:51 PM

## 2023-09-11 NOTE — Progress Notes (Signed)
 Physical Therapy Treatment Patient Details Name: Tricia [name] MRN: [id number] DOB: 11-Dec-1960 Today's Date: 09/11/2023   History of Present Illness Pt is a 63 y.o. female admitted with acute kidney injury superimposed on CKD, UTI, orthostatic hypotension with syncope, and rhabdomyolysis.  PMHx includes: COPD, Asthma, osteoarthritis, anxiety, depression, TypeII DM, Diastolic Dysfunction, GERD, HTN, Dyslipidemia, Rhabdomyolitis 2/2 fall, Parkinson's Disease, and syncope.    PT Comments  Pt was pleasant and motivated to participate during the session and put forth good effort throughout. Pt reported improved nausea compared to the last two days but stomach and LBP remained.  Despite pain pt was able to come to sitting at the EOB without physical assistance and put forth good effort with below therex.  Pt did require min A to prevent LOB upon coming to initial stand but once steadied pt was able to take several steps near the EOB and to the [facility] with only CGA.  Pt's amb distance limited by pt needing to have a BM with pt left on [facility] with call bell in place and nsg notified.  Pt will benefit from continued PT services upon discharge to safely address deficits listed in patient problem list for decreased caregiver assistance and eventual return to PLOF.      If plan is discharge home, recommend the following: Assistance with cooking/housework;Assist for transportation;Help with stairs or ramp for entrance;A lot of help with walking and/or transfers;A little help with bathing/dressing/bathroom   Can travel by private vehicle     No  Equipment Recommendations  Other (comment) (TBD at next venue of care)    Recommendations for Other Services       Precautions / Restrictions Precautions Precautions: Fall Restrictions Weight Bearing Restrictions Per Provider Order: No Other Position/Activity Restrictions: Orthostatic Hypotension     Mobility  Bed Mobility Overal bed mobility: Needs  Assistance       Supine to sit: Supervision     General bed mobility comments: Min extra time, effort, and use of bed rail only during sup to sit    Transfers Overall transfer level: Needs assistance Equipment used: Rolling walker (2 wheels) Transfers: Sit to/from Stand Sit to Stand: Min assist           General transfer comment: Min A to come to standing to prevent posterior LOB with min to mod verbal cues for hand placement    Ambulation/Gait Ambulation/Gait assistance: Contact guard assist Gait Distance (Feet): 3 Feet Assistive device: Rolling walker (2 wheels) Gait Pattern/deviations: Step-through pattern, Decreased step length - right, Decreased step length - left, Trunk flexed Gait velocity: decreased     General Gait Details: Pt able to take several small, effortful steps near the EOB and from bed to [facility] with distance limited by pt needing to have a BM   Stairs             Wheelchair Mobility     Tilt Bed    Modified Rankin (Stroke Patients Only)       Balance Overall balance assessment: Needs assistance Sitting-balance support: Feet supported Sitting balance-Leahy Scale: Good     Standing balance support: Bilateral upper extremity supported, During functional activity, Reliant on assistive device for balance Standing balance-Leahy Scale: Poor Standing balance comment: Min A to prevent posterior LOB upon initial stand                            Communication Communication Communication: No apparent difficulties  Cognition  Arousal: Alert Behavior During Therapy: WFL for tasks assessed/performed   PT - Cognitive impairments: No apparent impairments                         Following commands: Intact      Cueing Cueing Techniques: Verbal cues, Visual cues  Exercises Total Joint Exercises Ankle Circles/Pumps: Strengthening, Both, 10 reps Gluteal Sets: Strengthening, Both, 10 reps Towel Squeeze: Strengthening, Both,  10 reps Long Arc Quad: Strengthening, Both, 10 reps Knee Flexion: Strengthening, Both, 10 reps Marching in Standing: Strengthening, Both, 10 reps, Seated    General Comments        Pertinent Vitals/Pain Pain Assessment Pain Assessment: 0-10 Pain Score: 9  Pain Location: stomach and LBP Pain Descriptors / Indicators: Aching, Sore Pain Intervention(s): Premedicated before session, Monitored during session    Home Living                          Prior Function            PT Goals (current goals can now be found in the care plan section) Progress towards PT goals: PT to reassess next treatment    Frequency    Min 2X/week      PT Plan      Co-evaluation              AM-PAC PT 6 Clicks Mobility   Outcome Measure  Help needed turning from your back to your side while in a flat bed without using bedrails?: A Little Help needed moving from lying on your back to sitting on the side of a flat bed without using bedrails?: A Little Help needed moving to and from a bed to a chair (including a wheelchair)?: A Little Help needed standing up from a chair using your arms (e.g., wheelchair or bedside chair)?: A Little Help needed to walk in hospital room?: A Lot Help needed climbing 3-5 steps with a railing? : A Lot 6 Click Score: 16    End of Session Equipment Utilized During Treatment: Gait belt Activity Tolerance: Patient tolerated treatment well Patient left: Other (comment);with call bell/phone within reach (Pt left on [facility] for BM at end of session with call bell in place) Nurse Communication: Mobility status;Other (comment) (Nsg and CNA notified that pt left on BSC for BM at end of session) PT Visit Diagnosis: Muscle weakness (generalized) (M62.81);Difficulty in walking, not elsewhere classified (R26.2);Pain Pain - part of body:  (low back and stomach)     Time: 8368-8356 PT Time Calculation (min) (ACUTE ONLY): 12 min  Charges:    $Therapeutic  Activity: 8-22 mins PT General Charges $$ ACUTE PT VISIT: 1 Visit                     D. Scott Harlyn Rathmann PT, DPT 09/11/23, 5:01 PM

## 2023-09-11 NOTE — Progress Notes (Signed)
 Progress Note    Tricia [name]  FMW:[id number] DOB: Mar 26, 1960  DOA: 08/28/2023 PCP: Sowles, Krichna, MD      Brief Narrative:    Medical records reviewed and are as summarized below:  Tricia [name] is a 63 y.o. female with chronic pain, COPD, asthma, osteoarthritis, anxiety, depression, type 2 diabetes, diastolic CHF, GERD, hypertension, dyslipidemia, who presents to the ED with syncopal episode.  Initial vitals in the ED are 125/100, creatinine elevation of 2.55, CK  659.  Noncon head CT without acute intracranial abnormalities.  Patient received 2 L NS and was admitted.  Gradually her mental status has improved but she remains intermittently altered.  We have significant concerns about her ability to take care of herself at home.   7/23: Remained hemodynamically stable.  No TOC note for placement-sent another message.    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   S/p syncope: There is probably from orthostatic hypotension. S/p treatment with IV fluids. 2D echo showed preserved EF, grade 1 diastolic dysfunction.   Recurrent orthostatic hypotension: This may be chronic.  Risk factors for this include polypharmacy, diabetes mellitus and Parkinson's disease. She still orthostatic. Current midodrine  and pyridostigmine .    Polypharmacy, chronic pain,  opioid dependence: - Patient is on multiple psychiatric medications, duplicate antihistamine therapy, as well as chronic opioid therapy - Home meds include: BuSpar , Cymbalta , Flexeril , Xyzal , gabapentin , morphine , MS Contin , Seroquel , hydroxyzine . - Hydroxyzine  and immediate release morphine  have already been discontinued.    - Continue OxyContin  for now.   Outpatient follow-up with pain clinic for de-escalation of opioids strongly recommended.   Urinary retention: Foley catheter placed after multiple In-N-Out ureteral catheterization.  Failed voiding trial on 09/01/2023.  She was evaluated by the urologist on 09/02/2023.  Foley catheter has been replaced.  It was recommended that patient be discharged on Foley catheter.  Outpatient follow-up with urologist for voiding trial.   Acute UTI: S/p treatment with IV ceftriaxone  followed by PO cefadroxil .  Completed a course of antibiotic Urine culture from 08/31/2023 showed Klebsiella pneumoniae and Proteus mirabilis.   AKI, probable CKD stage IIIa: Creatinine is stable.  Elevated CK: Does not meet criteria for rhabdomyolysis.  CK improved from 659-547.  Hypokalemia: Improved Hypomagnesemia: Improved Hypophosphatemia: Repleted with potassium phosphate    Diarrhea: Imodium  as needed.  No indication for C. difficile testing at this time.   Comorbidities include COPD, Parkinson's disease on carbidopa -levodopa , anxiety, depression, type II DM, GERD, moderate malnutrition, hypoalbuminemia   General Weakness: PT and OT recommended discharge to SNF.  Follow-up with TOC to assist with disposition.  Awaiting placement at SNF.   Diet Order             DIET DYS 3 Fluid consistency: Thin  Diet effective now                  Consultants: Psychiatrist Urologist  Procedures: None  Medications:    (feeding supplement) PROSource Plus  30 mL Oral BID BM   busPIRone   7.5 mg Oral BID   carbidopa -levodopa   1 tablet Oral QHS    carbidopa -levodopa   1.5 tablet Oral TID   Chlorhexidine  Gluconate Cloth  6 each Topical Daily   colchicine   0.6 mg Oral Daily   cyclobenzaprine   10 mg Oral QHS   DULoxetine   60 mg Oral Daily   enoxaparin  (LOVENOX ) injection  40 mg Subcutaneous Q24H   feeding supplement  237 mL Oral TID BM   fiber supplement (BANATROL TF)  60 mL Oral BID   fluticasone   2 spray Each Nare Daily   gabapentin   400 mg Oral TID   ipratropium  2 spray Each Nare QID   midodrine   10 mg Oral TID WC   montelukast   10 mg Oral QHS   multivitamin with minerals  1 tablet Oral Daily   oxyCODONE   15 mg Oral Q12H   pantoprazole   40 mg Oral Daily   pyridostigmine   60 mg Oral Q8H   QUEtiapine   25 mg Oral QHS   sodium chloride   1 spray Each Nare Q4H   theophylline   400 mg Oral Daily   umeclidinium bromide   1 puff Inhalation Daily   Continuous Infusions:  promethazine  (PHENERGAN ) injection (IM or IVPB) Stopped (09/10/23 2206)     Anti-infectives (From admission, onward)    Start     Dose/Rate Route Frequency Ordered Stop   09/03/23 1100  cefadroxil  (DURICEF) capsule 500 mg        500 mg Oral 2 times daily 09/03/23 1007 09/04/23 2144   08/31/23 2200  cefTRIAXone  (ROCEPHIN ) 1 g in sodium chloride  0.9 % 100 mL IVPB  Status:  Discontinued        1 g 200 mL/hr over 30 Minutes Intravenous Every 24 hours 08/31/23 2041 09/03/23 1007              Family Communication/Anticipated D/C date and plan/Code Status   DVT prophylaxis: Place TED hose Start: 09/02/23 1524 enoxaparin  (LOVENOX ) injection 40 mg Start: 08/30/23 2200     Code Status: Full Code  Family Communication: Talked with son-in-law on phone  Disposition Plan: Plan discharge to SNF   Status is: Inpatient Remains inpatient appropriate because: Awaiting placement to SNF   Subjective:  Patient was seen and examined today.  No new concern.  She wants to go home.  Objective:    Vitals:   09/10/23 2159 09/11/23 0358 09/11/23 0746 09/11/23 1444   BP: 128/61 124/64 122/65 126/61  Pulse: 82 85 73 78  Resp: 18 18 16 15   Temp: 98.2 F (36.8 C) 97.6 F (36.4 C) 98.2 F (36.8 C) 98.1 F (36.7 C)  TempSrc: Oral   Oral  SpO2: 97% 97% 97% 98%  Weight:      Height:       No data found.   Intake/Output Summary (Last 24 hours) at 09/11/2023 1731 Last data filed at 09/11/2023 1300 Gross per 24 hour  Intake 524.75 ml  Output 450 ml  Net 74.75 ml    Filed Weights   08/28/23 1256  Weight: 71.7 kg    Exam: General.  Frail lady, in no acute distress. Pulmonary.  Lungs clear bilaterally, normal respiratory effort. CV.  Regular rate and rhythm, no JVD, rub or murmur. Abdomen.  Soft, nontender, nondistended, BS positive. CNS.  Alert and oriented .  No focal neurologic deficit. Extremities.  No edema, no cyanosis, pulses intact and  symmetrical.   Data Reviewed:   I have personally reviewed following labs and imaging studies:  Labs: Labs show the following:   Basic Metabolic Panel: Recent Labs  Lab 09/05/23 0440 09/11/23 0528  NA 135  --   K 3.5  --   CL 109  --   CO2 22  --   GLUCOSE 114*  --   BUN 10  --   CREATININE 1.08* 0.95  CALCIUM  7.5*  --   MG 1.7  --   PHOS 2.2*  --    GFR Estimated Creatinine Clearance: 61.9 mL/min (by C-G formula based on SCr of 0.95 mg/dL). Liver Function Tests: Recent Labs  Lab 09/05/23 0440  AST 77*  ALT 7  ALKPHOS 144*  BILITOT 1.1  PROT 5.1*  ALBUMIN 1.8*   No results for input(s): LIPASE, AMYLASE in the last 168 hours. No results for input(s): AMMONIA in the last 168 hours. Coagulation profile No results for input(s): INR, PROTIME in the last 168 hours.  CBC: Recent Labs  Lab 09/05/23 0440  WBC 7.7  NEUTROABS 4.8  HGB 9.7*  HCT 28.2*  MCV 91.0  PLT 246   Cardiac Enzymes: No results for input(s): CKTOTAL, CKMB, CKMBINDEX, TROPONINI in the last 168 hours.  BNP (last 3 results) No results for input(s): PROBNP in the last 8760  hours. CBG: No results for input(s): GLUCAP in the last 168 hours. D-Dimer: No results for input(s): DDIMER in the last 72 hours. Hgb A1c: No results for input(s): HGBA1C in the last 72 hours. Lipid Profile: No results for input(s): CHOL, HDL, LDLCALC, TRIG, CHOLHDL, LDLDIRECT in the last 72 hours. Thyroid  function studies: No results for input(s): TSH, T4TOTAL, T3FREE, THYROIDAB in the last 72 hours.  Invalid input(s): FREET3 Anemia work up: No results for input(s): VITAMINB12, FOLATE, FERRITIN, TIBC, IRON, RETICCTPCT in the last 72 hours. Sepsis Labs: Recent Labs  Lab 09/05/23 0440  WBC 7.7    Microbiology No results found for this or any previous visit (from the past 240 hours).   Procedures and diagnostic studies:  No results found.   LOS: 13 days   This record has been created using Conservation officer, historic buildings. Errors have been sought and corrected,but may not always be located. Such creation errors do not reflect on the standard of care.   Tricia Dare  [employer] and [email or web address]. If 7PM-7AM, please contact night-coverage at [email or web address]  09/11/2023, 5:31 PM

## 2023-09-11 NOTE — Plan of Care (Signed)

## 2023-09-12 DIAGNOSIS — E785 Hyperlipidemia, unspecified: Secondary | ICD-10-CM | POA: Diagnosis not present

## 2023-09-12 DIAGNOSIS — N179 Acute kidney failure, unspecified: Secondary | ICD-10-CM | POA: Diagnosis not present

## 2023-09-12 DIAGNOSIS — R748 Abnormal levels of other serum enzymes: Secondary | ICD-10-CM | POA: Diagnosis not present

## 2023-09-12 DIAGNOSIS — R55 Syncope and collapse: Secondary | ICD-10-CM | POA: Diagnosis not present

## 2023-09-12 LAB — BASIC METABOLIC PANEL WITH GFR
Anion gap: 8 (ref 5–15)
BUN: 9 mg/dL (ref 8–23)
CO2: 24 mmol/L (ref 22–32)
Calcium: 7.6 mg/dL — ABNORMAL LOW (ref 8.9–10.3)
Chloride: 107 mmol/L (ref 98–111)
Creatinine, Ser: 0.99 mg/dL (ref 0.44–1.00)
GFR, Estimated: >60 mL/min (ref >60)
Glucose, Bld: 113 mg/dL — ABNORMAL HIGH (ref 70–99)
Potassium: 2.4 mmol/L — CL (ref 3.5–5.1)
Sodium: 139 mmol/L (ref 135–145)

## 2023-09-12 LAB — MAGNESIUM: Magnesium: 1.8 mg/dL (ref 1.7–2.4)

## 2023-09-12 LAB — PHOSPHORUS: Phosphorus: 3.5 mg/dL (ref 2.5–4.6)

## 2023-09-12 MED ORDER — SODIUM CHLORIDE 0.9 % IV BOLUS
500.0000 mL | Freq: Once | INTRAVENOUS | Status: AC
  Administered 2023-09-12: 500 mL via INTRAVENOUS

## 2023-09-12 MED ORDER — POTASSIUM CHLORIDE 20 MEQ PO PACK
40.0000 meq | PACK | Freq: Once | ORAL | Status: AC
  Administered 2023-09-12: 40 meq via ORAL
  Filled 2023-09-12: qty 2

## 2023-09-12 MED ORDER — MIRTAZAPINE 15 MG PO TBDP
15.0000 mg | ORAL_TABLET | Freq: Every day | ORAL | Status: DC
  Administered 2023-09-12 – 2023-09-17 (×6): 15 mg via ORAL
  Filled 2023-09-12 (×7): qty 1

## 2023-09-12 MED ORDER — GUAIFENESIN 100 MG/5ML PO LIQD
5.0000 mL | ORAL | Status: DC | PRN
  Administered 2023-09-12: 5 mL via ORAL
  Filled 2023-09-12: qty 10

## 2023-09-12 MED ORDER — ALUM & MAG HYDROXIDE-SIMETH 200-200-20 MG/5ML PO SUSP
30.0000 mL | ORAL | Status: DC | PRN
  Administered 2023-09-12: 30 mL via ORAL
  Filled 2023-09-12: qty 30

## 2023-09-12 MED ORDER — POTASSIUM CHLORIDE CRYS ER 20 MEQ PO TBCR
40.0000 meq | EXTENDED_RELEASE_TABLET | Freq: Once | ORAL | Status: AC
  Administered 2023-09-12: 40 meq via ORAL
  Filled 2023-09-12: qty 2

## 2023-09-12 NOTE — Progress Notes (Addendum)
 PT Cancellation Note  Patient Details Name: Tricia [name] MRN: [id number] DOB: July 20, 1960   Cancelled Treatment:     PT attempt. Pt just getting onto [facility] with RN staff. Pt unavailable to participate at this time. Per RN pt has been up to [facility] a lot today due to having diarrhea throughout the day. Acute PT will continue to follow and return at a later time/date.    [name] 09/12/2023, 3:58 PM

## 2023-09-12 NOTE — Progress Notes (Signed)
 Progress Note    Tricia [name]  FMW:[id number] DOB: [birth year]  DOA: 08/28/2023 PCP: Sowles, Krichna, MD      Brief Narrative:    Medical records reviewed and are as summarized below:  Tricia [name] is a 63 y.o. female with chronic pain, COPD, asthma, osteoarthritis, anxiety, depression, type 2 diabetes, diastolic CHF, GERD, hypertension, dyslipidemia, who presents to the ED with syncopal episode.  Initial vitals in the ED are 125/100, creatinine elevation of 2.55, CK  659.  Noncon head CT without acute intracranial abnormalities.  Patient received 2 L NS and was admitted.  Gradually her mental status has improved but she remains intermittently altered.  We have significant concerns about her ability to take care of herself at home.   7/23: Remained hemodynamically stable.  No TOC note for placement-sent another message.   7/24: Remained hemodynamically stable, patient does not have capacity to make decision per psychiatry-she was evaluated on 7/15 for capacity.  She wants to go home but unsafe environment with frequent falls and mixing up of medications.  Another message sent to [facility] as she is medically stable for the past many days with unnecessary length of stay now.   Assessment/Plan:   Principal Problem:   Acute kidney injury superimposed on chronic kidney disease (HCC) Active Problems:   Syncope, vasovagal   Elevated CK   Dyslipidemia   GERD without esophagitis   Parkinson disease (HCC)   Anxiety and depression   Chronic obstructive pulmonary disease (COPD) (HCC)   Type 2 diabetes mellitus with stage 3 chronic kidney disease (HCC)   Malnutrition of moderate degree   Nutrition Problem: Moderate Malnutrition Etiology: chronic illness (COPD, Parkinson's disease)  Signs/Symptoms: mild fat depletion, moderate fat depletion, mild muscle depletion, moderate muscle depletion   Body mass index is 22.19 kg/m.   S/p syncope: There is probably from  orthostatic hypotension. S/p treatment with IV fluids. 2D echo showed preserved EF, grade 1 diastolic dysfunction.   Recurrent orthostatic hypotension: This may be chronic.  Risk factors for this include polypharmacy, diabetes mellitus and Parkinson's disease. She still orthostatic. Current midodrine  and pyridostigmine .    Polypharmacy, chronic pain, opioid dependence: - Patient is on multiple psychiatric medications, duplicate antihistamine therapy, as well as chronic opioid therapy - Home meds include: BuSpar , Cymbalta , Flexeril , Xyzal , gabapentin , morphine , MS Contin , Seroquel , hydroxyzine . - Hydroxyzine  and immediate release morphine  have already been discontinued.    - Continue OxyContin  for now.   Outpatient follow-up with pain clinic for de-escalation of opioids strongly recommended.   Urinary retention: Foley catheter placed after multiple In-N-Out ureteral catheterization.  Failed voiding trial on 09/01/2023.  She was evaluated by the urologist on 09/02/2023.  Foley catheter has been replaced.  It was recommended that patient be discharged on Foley catheter.  Outpatient follow-up with urologist for voiding trial.   Acute UTI: S/p treatment with IV ceftriaxone  followed by PO cefadroxil .  Completed a course of antibiotic Urine culture from 08/31/2023 showed Klebsiella pneumoniae and Proteus mirabilis.   AKI, probable CKD stage IIIa: Creatinine is stable.  Elevated CK: Does not meet criteria for rhabdomyolysis.  CK improved from 659-547.  Hypokalemia: Improved Hypomagnesemia: Improved Hypophosphatemia: Repleted with potassium phosphate    Diarrhea: Imodium  as needed.  No indication for C. difficile testing at this time.   Comorbidities include COPD, Parkinson's disease on carbidopa -levodopa , anxiety, depression, type II DM, GERD, moderate malnutrition, hypoalbuminemia   General Weakness: PT and OT recommended discharge to SNF.  Follow-up with TOC  to assist with  disposition.  Awaiting placement at SNF-medically stable for the past many days   Diet Order             DIET DYS 3 Fluid consistency: Thin  Diet effective now                  Consultants: Information systems manager  Procedures: None   Medications:    (feeding supplement) PROSource Plus  30 mL Oral BID BM   vitamin C   250 mg Oral BID   busPIRone   7.5 mg Oral BID   carbidopa -levodopa   1 tablet Oral QHS   carbidopa -levodopa   1.5 tablet Oral TID   Chlorhexidine  Gluconate Cloth  6 each Topical Daily   colchicine   0.6 mg Oral Daily   cyclobenzaprine   10 mg Oral QHS   DULoxetine   60 mg Oral Daily   enoxaparin  (LOVENOX ) injection  40 mg Subcutaneous Q24H   feeding supplement  237 mL Oral TID BM   fiber supplement (BANATROL TF)  60 mL Oral BID   fluticasone   2 spray Each Nare Daily   gabapentin   400 mg Oral TID   ipratropium  2 spray Each Nare QID   midodrine   10 mg Oral TID WC   montelukast   10 mg Oral QHS   multivitamin with minerals  1 tablet Oral Daily   oxyCODONE   15 mg Oral Q12H   pantoprazole   40 mg Oral Daily   pyridostigmine   60 mg Oral Q8H   QUEtiapine   25 mg Oral QHS   sodium chloride   1 spray Each Nare Q4H   theophylline   400 mg Oral Daily   umeclidinium bromide   1 puff Inhalation Daily   Continuous Infusions:  promethazine  (PHENERGAN ) injection (IM or IVPB) 12.5 mg (09/12/23 1027)     Anti-infectives (From admission, onward)    Start     Dose/Rate Route Frequency Ordered Stop   09/03/23 1100  cefadroxil  (DURICEF) capsule 500 mg        500 mg Oral 2 times daily 09/03/23 1007 09/04/23 2144   08/31/23 2200  cefTRIAXone  (ROCEPHIN ) 1 g in sodium chloride  0.9 % 100 mL IVPB  Status:  Discontinued        1 g 200 mL/hr over 30 Minutes Intravenous Every 24 hours 08/31/23 2041 09/03/23 1007              Family Communication/Anticipated D/C date and plan/Code Status   DVT prophylaxis: Place TED hose Start: 09/02/23 1524 enoxaparin  (LOVENOX )  injection 40 mg Start: 08/30/23 2200     Code Status: Full Code  Family Communication: Talked with son-in-law on phone  Disposition Plan: Plan discharge to SNF   Status is: Inpatient Remains inpatient appropriate because: Awaiting placement to SNF   Subjective:  Patient was sleeping when seen today, easily arousable.  Stating that she is tired and wants to get some sleep.  Denies any pain.  Objective:    Vitals:   09/11/23 2157 09/12/23 0337 09/12/23 0500 09/12/23 0837  BP: 133/65 125/62  124/62  Pulse: 88 91  79  Resp:  15  20  Temp:  98.3 F (36.8 C)  97.9 F (36.6 C)  TempSrc:  Oral  Oral  SpO2: 96% 96%  97%  Weight:   66.2 kg   Height:       Orthostatic VS for the past 24 hrs:  BP- Lying Pulse- Lying BP- Sitting Pulse- Sitting BP- Standing at 0 minutes Pulse- Standing at 0 minutes  09/12/23 0837 124/62 79 120/61 90 (!) 78/62 107     Intake/Output Summary (Last 24 hours) at 09/12/2023 1402 Last data filed at 09/12/2023 0900 Gross per 24 hour  Intake 600 ml  Output 550 ml  Net 50 ml    Filed Weights   08/28/23 1256 09/12/23 0500  Weight: 71.7 kg 66.2 kg    Exam: General.  Frail lady, in no acute distress. Pulmonary.  Lungs clear bilaterally, normal respiratory effort. CV.  Regular rate and rhythm, no JVD, rub or murmur. Abdomen.  Soft, nontender, nondistended, BS positive. CNS.  Alert and oriented .  No focal neurologic deficit. Extremities.  No edema, no cyanosis, pulses intact and symmetrical.   Data Reviewed:   I have personally reviewed following labs and imaging studies:  Labs: Labs show the following:   Basic Metabolic Panel: Recent Labs  Lab 09/11/23 0528 09/12/23 0347  NA  --  139  K  --  2.4*  CL  --  107  CO2  --  24  GLUCOSE  --  113*  BUN  --  9  CREATININE 0.95 0.99  CALCIUM   --  7.6*  MG  --  1.8  PHOS  --  3.5   GFR Estimated Creatinine Clearance: 59.4 mL/min (by C-G formula based on SCr of 0.99 mg/dL). Liver  Function Tests: No results for input(s): AST, ALT, ALKPHOS, BILITOT, PROT, ALBUMIN in the last 168 hours.  No results for input(s): LIPASE, AMYLASE in the last 168 hours. No results for input(s): AMMONIA in the last 168 hours. Coagulation profile No results for input(s): INR, PROTIME in the last 168 hours.  CBC: No results for input(s): WBC, NEUTROABS, HGB, HCT, MCV, PLT in the last 168 hours.  Cardiac Enzymes: No results for input(s): CKTOTAL, CKMB, CKMBINDEX, TROPONINI in the last 168 hours.  BNP (last 3 results) No results for input(s): PROBNP in the last 8760 hours. CBG: No results for input(s): GLUCAP in the last 168 hours. D-Dimer: No results for input(s): DDIMER in the last 72 hours. Hgb A1c: No results for input(s): HGBA1C in the last 72 hours. Lipid Profile: No results for input(s): CHOL, HDL, LDLCALC, TRIG, CHOLHDL, LDLDIRECT in the last 72 hours. Thyroid  function studies: No results for input(s): TSH, T4TOTAL, T3FREE, THYROIDAB in the last 72 hours.  Invalid input(s): FREET3 Anemia work up: No results for input(s): VITAMINB12, FOLATE, FERRITIN, TIBC, IRON, RETICCTPCT in the last 72 hours. Sepsis Labs: No results for input(s): PROCALCITON, WBC, LATICACIDVEN in the last 168 hours.   Microbiology No results found for this or any previous visit (from the past 240 hours).   Procedures and diagnostic studies:  No results found.   LOS: 14 days   This record has been created using Conservation officer, historic buildings. Errors have been sought and corrected,but may not always be located. Such creation errors do not reflect on the standard of care.   Amaryllis Dare  [employer] and spatial [email or web address]. If 7PM-7AM, please contact night-coverage at [email or web address]  09/12/2023, 2:02 PM

## 2023-09-12 NOTE — Plan of Care (Signed)
  Problem: Education: Goal: Knowledge of General Education information will improve Description: Including pain rating scale, medication(s)/side effects and non-pharmacologic comfort measures Outcome: Not Progressing   Problem: Health Behavior/Discharge Planning: Goal: Ability to manage health-related needs will improve Outcome: Not Progressing   Problem: Clinical Measurements: Goal: Ability to maintain clinical measurements within normal limits will improve Outcome: Not Progressing Goal: Diagnostic test results will improve Outcome: Not Progressing Goal: Respiratory complications will improve Outcome: Not Progressing   Problem: Activity: Goal: Risk for activity intolerance will decrease Outcome: Not Progressing   Problem: Coping: Goal: Level of anxiety will decrease Outcome: Not Progressing   Problem: Elimination: Goal: Will not experience complications related to bowel motility Outcome: Not Progressing Goal: Will not experience complications related to urinary retention Outcome: Not Progressing   Problem: Pain Managment: Goal: General experience of comfort will improve and/or be controlled Outcome: Not Progressing

## 2023-09-12 NOTE — Plan of Care (Signed)
   Problem: Education: Goal: Knowledge of General Education information will improve Description: Including pain rating scale, medication(s)/side effects and non-pharmacologic comfort measures Outcome: Progressing   Problem: Clinical Measurements: Goal: Respiratory complications will improve Outcome: Progressing

## 2023-09-12 NOTE — Progress Notes (Signed)
 Initial Nutrition Assessment  DOCUMENTATION CODES:   Non-severe (moderate) malnutrition in context of chronic illness  INTERVENTION:   Ensure Plus High Protein po TID, each supplement provides 350 kcal and 20 grams of protein  Pro-Source Plus 30ml po BID- Each supplement provides 100kcal and 15g protein   Magic cup TID with meals, each supplement provides 290 kcal and 9 grams of protein  MVI po daily   Vitamin C  250mg  po BID   D/C banatrol supplement   Pt remains at high refeed risk; recommend monitor potassium, magnesium  and phosphorus labs daily until stable  Daily weights   Recommend appetite stimulant   Check vitamins B12, D and folate labs  NUTRITION DIAGNOSIS:   Moderate Malnutrition related to chronic illness (COPD, Parkinson's disease) as evidenced by mild fat depletion, moderate fat depletion, mild muscle depletion, moderate muscle depletion. -ongoing   GOAL:   Patient will meet greater than or equal to 90% of their needs -not met   MONITOR:   PO intake, Supplement acceptance, Labs, Weight trends, Skin, I & O's  REASON FOR ASSESSMENT:   Consult Assessment of nutrition requirement/status  ASSESSMENT:   63 y/o female with h/o CKD III, HLD, Parkinson's dementia, anxiety, MDD, COPD, GERD, DM, CHF, chronic pain, HTN, gout, HOH and tremor who is admitted with syncope, AKI, orthostatic hypotension, polypharmacy and urinary retention.  Visited pt's room today. Pt received phenergan  and sleeping soundly at the time of RD visit; RD unable to arouse patient. Pt documented to be eating 0-100% of meals in hospital. Spoke with RN, pt with nausea and indigestion today; pt eating bites. Pt with anxiety and does not rest well. Pt has been refusing most of the Ensure, Banatrol and ProSource supplements. Per chart, pt is down ~12lbs(8%) since admission; this is significant weight loss. Pt with increased muscle and fat depletions on exam today. Pt is at high risk for  developing severe malnutrition. Pt's nutritional status was discussed with MD today; remeron  ordered for appetite stimulant. RD will add Magic Cups to meal trays and will add vitamin C  to support wound healing. Pt remains at high refeed risk. RD will discontinue Banatrol as pt refuses most of it and diarrhea is not improved (apparently is chronic per pt report). RD will check vitamin labs to r/o deficiency.        Medications reviewed and include: vitamin C , lovenox , midodrine , MVI, oxycodone , protonix , phenergan    Labs reviewed: Na 139 wnl, K 2.4(L), P 3.5 wnl, Mg 1.8 wnl Hgb 9.7(L), Hct 28.2(L)  NUTRITION - FOCUSED PHYSICAL EXAM:  Flowsheet Row Most Recent Value  Orbital Region Severe depletion  Upper Arm Region Moderate depletion  Thoracic and Lumbar Region Mild depletion  Buccal Region Moderate depletion  Temple Region Severe depletion  Clavicle Bone Region Moderate depletion  Clavicle and Acromion Bone Region Moderate depletion  Scapular Bone Region Moderate depletion  Dorsal Hand Mild depletion  Patellar Region Moderate depletion  Anterior Thigh Region Severe depletion  Posterior Calf Region Severe depletion  Edema (RD Assessment) Mild  Hair Reviewed  Eyes Reviewed  Mouth Reviewed  Skin Reviewed  Nails Reviewed   Diet Order:   Diet Order             DIET DYS 3 Fluid consistency: Thin  Diet effective now                  EDUCATION NEEDS:   Education needs have been addressed  Skin:  Skin Assessment: Reviewed RN Assessment (Stage II  sacrum)  Last BM:  7/24- type 7  Height:   Ht Readings from Last 1 Encounters:  08/28/23 5' 8 (1.727 m)    Weight:   Wt Readings from Last 1 Encounters:  09/12/23 66.2 kg    Ideal Body Weight:  63.6 kg  BMI:  Body mass index is 22.19 kg/m.  Estimated Nutritional Needs:   Kcal:  1900-2200kcal/day  Protein:  95-110g/day  Fluid:  2.0-2.3L/day  Tricia Shams MS, RD, LDN If unable to be reached, please send  secure chat to RD inpatient available from 8:00a-4:00p daily

## 2023-09-12 NOTE — TOC Initial Note (Addendum)
 Transition of Care [facility]) - Initial/Assessment Note    Patient Details  Name: Tricia [name] MRN: [id number] Date of Birth: [birth year]  Transition of Care [facility]) CM/SW Contact:    [name], RN 09/12/2023, 12:24 PM  Clinical Narrative:                  CM to patient's room regarding discharge care planning. Per patient, lives with son in [place], Ozell. Per patient has 5 dogs--vaccinated. Per patient has access to walker, bedside commode, stand up walker. CM and patient discussed SNF recommendations. Per patient declined SNF and prefers OP Rehab Per patient, son in law, [place]ell, will provide transportation home.   CM call to patient's granddaughter, Norlene, phone: [phone] regarding SNF recommendations versus patient request to attend outpatient rehab. Per patient's granddaughter requests to speak to Dr. Caleen regarding patient status. CM alert to Dr. Caleen regarding patient granddaughter request.   Call received from patient's granddaughter, Norlene, will to hospital tomorrow to discuss possible discharge options and possible guardianship proceedings. CM alert to Dr. Caleen.  Expected Discharge Plan: Skilled Nursing Facility Barriers to Discharge: Continued Medical Work up   Patient Goals and CMS Choice    SNF-declined per SNF     Expected Discharge Plan and Services       Living arrangements for the past 2 months: Single Family Home       Prior Living Arrangements/Services Living arrangements for the past 2 months: Single Family Home Lives with:: Other (Comment) Tatiana, son in Social worker) Patient language and need for interpreter reviewed:: No Do you feel safe going back to the place where you live?: Yes      Need for Family Participation in Patient Care: Yes (Comment) Care giver support system in place?: Yes (comment) Current home services: DME (Walker, bedside commode, powered wheelchair, stand up walker) Criminal Activity/Legal Involvement Pertinent to Current  Situation/Hospitalization: No - Comment as needed  Activities of Daily Living   ADL Screening (condition at time of admission) Independently performs ADLs?: Yes (appropriate for developmental age) Is the patient deaf or have difficulty hearing?: No Does the patient have difficulty seeing, even when wearing glasses/contacts?: No Does the patient have difficulty concentrating, remembering, or making decisions?: No  Permission Sought/Granted Permission sought to share information with : Family Supports, Case Estate manager/land agent granted to share information with : Yes, Verbal Permission Granted  Share Information with NAME: Hailey/Michael     Permission granted to share info w Relationship: Granddaughter/Son in [employer]     Emotional Assessment Appearance:: Appears older than stated age Attitude/Demeanor/Rapport: Engaged Affect (typically observed): Calm Orientation: : Oriented to Self, Oriented to Place, Oriented to  Time Alcohol / Substance Use: Tobacco Use (Last cigarette was 08/28/2023)    Admission diagnosis:  Generalized weakness [R53.1] AKI (acute kidney injury) (HCC) [N17.9] Fall, initial encounter [W19.XXXA] Non-traumatic rhabdomyolysis [M62.82] Acute kidney injury superimposed on chronic kidney disease (HCC) [N17.9, N18.9] Patient Active Problem List   Diagnosis Date Noted   Malnutrition of moderate degree 09/02/2023   Syncope, vasovagal 08/29/2023   Parkinson disease (HCC) 08/29/2023   Anxiety and depression 08/29/2023   Chronic obstructive pulmonary disease (COPD) (HCC) 08/29/2023   GERD without esophagitis 08/29/2023   Elevated CK 08/29/2023   Type 2 diabetes mellitus with stage 3 chronic kidney disease (HCC) 08/29/2023   Acute kidney injury superimposed on chronic kidney disease (HCC) 08/28/2023   Sacral wound 02/21/2022   Pressure injury of sacral region, stage 1 02/21/2022   Nausea 09/08/2021  Hypoalbuminemia due to protein-calorie malnutrition (HCC) 09/08/2021    Lumbar herniated disc 07/11/2021   Calculus of gallbladder without cholecystitis without obstruction 07/11/2021   Senile purpura (HCC) 06/05/2021   Atherosclerosis of aorta (HCC) 06/05/2021   Parkinson's disease (HCC) 06/05/2021   Chronic kidney disease (CKD) stage G3a/A2, moderately decreased glomerular filtration rate (GFR) between 45-59 mL/min/1.73 square meter and albuminuria creatinine ratio between 30-299 mg/g (HCC) 06/05/2021   Esophageal dysphagia    Gastric erythema    Columnar-lined esophagus    Moderate malnutrition (HCC) 09/26/2020   Centrilobular emphysema (HCC) 03/30/2020   History of prolonged Q-T interval on ECG 11/18/2019   GAD (generalized anxiety disorder) 10/13/2019   MDD (major depressive disorder), recurrent episode, moderate (HCC) 10/13/2019   At risk for long QT syndrome 10/13/2019   Nonrheumatic mitral valve regurgitation 05/04/2019   Benign neoplasm of descending colon    Polyp of sigmoid colon    Polyneuropathy 10/21/2014   Chronic venous insufficiency 10/05/2014   Bilateral leg edema 08/16/2014   Major depression in partial remission (HCC) 08/16/2014   Acid reflux 08/16/2014   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   Asthma, moderate persistent 08/16/2014   Carpal tunnel syndrome 08/16/2014   Cervical pain 08/16/2014   CAFL (chronic airflow limitation) (HCC) 08/16/2014   Type 2 diabetes mellitus with peripheral neuropathy (HCC) 08/16/2014   Diabetes mellitus type 2, insulin  dependent (HCC) 08/16/2014   Dyslipidemia 08/16/2014   Tobacco use disorder 08/16/2014   Essential (primary) hypertension 08/16/2014   Benign neoplasm of stomach 08/16/2014   Gout 08/16/2014   Mixed hyperlipidemia 08/16/2014   Low back pain 08/16/2014   Lumbar radiculopathy 08/16/2014   Headache, migraine 08/16/2014   Arthralgia of multiple joints 08/16/2014   Vitamin D  deficiency 08/16/2014   Primary osteoarthritis of both knees 06/22/2014   Benign essential tremor  10/02/2013   Cervical dystonia 10/02/2013   Chronic diastolic heart failure (HCC) 11/16/2012   Chronic pain 11/13/2012   PCP:  Glenard Mire, MD Pharmacy:   [facility] [place] - [address] [place] [address] Phone: [phone] Fax: [phone]  [facility]) - [place], [place] - [address] [facility] [address] [place] [address] [place] [address] Phone: [phone] Fax: [phone]  [facility] - [place], [place] - [address] [place] [address] Phone: [phone] Fax: [phone]     Social Drivers of Health (SDOH) Social History: SDOH Screenings   Food Insecurity: No Food Insecurity (08/29/2023)  Housing: Low Risk  (08/29/2023)  Transportation Needs: No Transportation Needs (08/29/2023)  Utilities: Not At Risk (08/29/2023)  Alcohol Screen: Low Risk  (03/07/2023)  Depression (PHQ2-9): High Risk (05/01/2023)  Financial Resource Strain: Patient Declined (06/18/2023)   Received from [facility]  Physical Activity: Sufficiently Active (03/07/2023)  Social Connections: Socially Isolated (08/29/2023)  Stress: No Stress Concern Present (03/07/2023)  Tobacco Use: High Risk (08/28/2023)  Health Literacy: Adequate Health Literacy (03/07/2023)   SDOH Interventions:     Readmission Risk Interventions     No data to display

## 2023-09-13 DIAGNOSIS — R55 Syncope and collapse: Secondary | ICD-10-CM | POA: Diagnosis not present

## 2023-09-13 DIAGNOSIS — R748 Abnormal levels of other serum enzymes: Secondary | ICD-10-CM | POA: Diagnosis not present

## 2023-09-13 DIAGNOSIS — N179 Acute kidney failure, unspecified: Secondary | ICD-10-CM | POA: Diagnosis not present

## 2023-09-13 DIAGNOSIS — E785 Hyperlipidemia, unspecified: Secondary | ICD-10-CM | POA: Diagnosis not present

## 2023-09-13 LAB — CBC
HCT: 32.3 % — ABNORMAL LOW (ref 36.0–46.0)
Hemoglobin: 10.6 g/dL — ABNORMAL LOW (ref 12.0–15.0)
MCH: 32.3 pg (ref 26.0–34.0)
MCHC: 32.8 g/dL (ref 30.0–36.0)
MCV: 98.5 fL (ref 80.0–100.0)
Platelets: 404 K/uL — ABNORMAL HIGH (ref 150–400)
RBC: 3.28 MIL/uL — ABNORMAL LOW (ref 3.87–5.11)
RDW: 18.7 % — ABNORMAL HIGH (ref 11.5–15.5)
WBC: 9.9 K/uL (ref 4.0–10.5)
nRBC: 0 % (ref 0.0–0.2)

## 2023-09-13 LAB — BASIC METABOLIC PANEL WITH GFR
Anion gap: 8 (ref 5–15)
BUN: 8 mg/dL (ref 8–23)
CO2: 25 mmol/L (ref 22–32)
Calcium: 7.7 mg/dL — ABNORMAL LOW (ref 8.9–10.3)
Chloride: 108 mmol/L (ref 98–111)
Creatinine, Ser: 1.1 mg/dL — ABNORMAL HIGH (ref 0.44–1.00)
GFR, Estimated: 57 mL/min — ABNORMAL LOW (ref >60)
Glucose, Bld: 109 mg/dL — ABNORMAL HIGH (ref 70–99)
Potassium: 2.9 mmol/L — ABNORMAL LOW (ref 3.5–5.1)
Sodium: 141 mmol/L (ref 135–145)

## 2023-09-13 LAB — VITAMIN B12: Vitamin B-12: 1105 pg/mL — ABNORMAL HIGH (ref 180–914)

## 2023-09-13 LAB — GASTROINTESTINAL PANEL BY PCR, STOOL (REPLACES STOOL CULTURE)

## 2023-09-13 LAB — FOLATE: Folate: 15.1 ng/mL (ref >5.9)

## 2023-09-13 LAB — VITAMIN D 25 HYDROXY (VIT D DEFICIENCY, FRACTURES): Vit D, 25-Hydroxy: 17.44 ng/mL — ABNORMAL LOW (ref 30–100)

## 2023-09-13 MED ORDER — DICYCLOMINE HCL 10 MG PO CAPS
10.0000 mg | ORAL_CAPSULE | Freq: Three times a day (TID) | ORAL | Status: DC
  Administered 2023-09-13 – 2023-09-18 (×19): 10 mg via ORAL
  Filled 2023-09-13 (×22): qty 1

## 2023-09-13 MED ORDER — MAGNESIUM SULFATE 2 GM/50ML IV SOLN
2.0000 g | Freq: Once | INTRAVENOUS | Status: AC
  Administered 2023-09-13: 2 g via INTRAVENOUS
  Filled 2023-09-13: qty 50

## 2023-09-13 MED ORDER — POTASSIUM CHLORIDE CRYS ER 20 MEQ PO TBCR
40.0000 meq | EXTENDED_RELEASE_TABLET | ORAL | Status: AC
  Administered 2023-09-13 (×3): 40 meq via ORAL
  Filled 2023-09-13 (×3): qty 2

## 2023-09-13 MED ORDER — VITAMIN D (ERGOCALCIFEROL) 1.25 MG (50000 UNIT) PO CAPS
50000.0000 [IU] | ORAL_CAPSULE | ORAL | Status: DC
  Administered 2023-09-13: 50000 [IU] via ORAL
  Filled 2023-09-13: qty 1

## 2023-09-13 NOTE — Progress Notes (Signed)
 Progress Note    Tricia [name]  FMW:[id number] DOB: June 05, 1960  DOA: 08/28/2023 PCP: Sowles, Krichna, MD      Brief Narrative:    Medical records reviewed and are as summarized below:  Tricia [name] is a 63 y.o. female with chronic pain, COPD, asthma, osteoarthritis, anxiety, depression, type 2 diabetes, diastolic CHF, GERD, hypertension, dyslipidemia, who presents to the ED with syncopal episode.  Initial vitals in the ED are 125/100, creatinine elevation of 2.55, CK  659.  Noncon head CT without acute intracranial abnormalities.  Patient received 2 L NS and was admitted.  Gradually her mental status has improved but she remains intermittently altered.  We have significant concerns about her ability to take care of herself at home.   7/23: Remained hemodynamically stable.  No TOC note for placement-sent another message.   7/24: Remained hemodynamically stable, patient does not have capacity to make decision per psychiatry-she was evaluated on 7/15 for capacity.  She wants to go home but unsafe environment with frequent falls and mixing up of medications.  Another message sent to [facility] as she is medically stable for the past many days with unnecessary length of stay now.  7/25: Hemodynamically stable but continued to have diarrhea-GI pathogen panel was negative.  No leukocytosis, mild hypokalemia which is being repleted.  Borderline magnesium  at 1.8.  B12 and folate levels were normal, vitamin D  low-starting on supplement. TOC still need to work on placement.  Patient remained medically stable for discharge.   Assessment/Plan:   Principal Problem:   Acute kidney injury superimposed on chronic kidney disease (HCC) Active Problems:   Syncope, vasovagal   Elevated CK   Dyslipidemia   GERD without esophagitis   Parkinson disease (HCC)   Anxiety and depression   Chronic obstructive pulmonary disease (COPD) (HCC)   Type 2 diabetes mellitus with stage 3 chronic  kidney disease (HCC)   Malnutrition of moderate degree   Nutrition Problem: Moderate Malnutrition Etiology: chronic illness (COPD, Parkinson's disease)  Signs/Symptoms: mild fat depletion, moderate fat depletion, mild muscle depletion, moderate muscle depletion   Body mass index is 22.63 kg/m.   S/p syncope: There is probably from orthostatic hypotension. S/p treatment with IV fluids. 2D echo showed preserved EF, grade 1 diastolic dysfunction.   Recurrent orthostatic hypotension: This may be chronic.  Risk factors for this include polypharmacy, diabetes mellitus and Parkinson's disease. She still orthostatic. Current midodrine  and pyridostigmine .    Polypharmacy, chronic pain, opioid dependence: - Patient is on multiple psychiatric medications, duplicate antihistamine therapy, as well as chronic opioid therapy - Home meds include: BuSpar , Cymbalta , Flexeril , Xyzal , gabapentin , morphine , MS Contin , Seroquel , hydroxyzine . - Hydroxyzine  and immediate release morphine  have already been discontinued.    - Continue OxyContin  for now.   Outpatient follow-up with pain clinic for de-escalation of opioids strongly recommended.   Urinary retention: Foley catheter placed after multiple In-N-Out ureteral catheterization.  Failed voiding trial on 09/01/2023.  She was evaluated by the urologist on 09/02/2023.  Foley catheter has been replaced.  It was recommended that patient be discharged on Foley catheter.  Outpatient follow-up with urologist for voiding trial.   Acute UTI: S/p treatment with IV ceftriaxone  followed by PO cefadroxil .  Completed a course of antibiotic Urine culture from 08/31/2023 showed Klebsiella pneumoniae and Proteus mirabilis.   AKI, probable CKD stage IIIa: Creatinine is stable.  Elevated CK: Does not meet criteria for rhabdomyolysis.  CK improved from 659-547.  Hypokalemia: Potassium at 2.9 likely  due to diarrhea -Replete potassium and monitor  Hypomagnesemia:  Magnesium  at 1.8 today. - Giving some more magnesium   Hypophosphatemia: Repleted with potassium phosphate   Diarrhea: Imodium  as needed.  No indication for C. difficile testing at this time.  GI pathogen panel negative   Comorbidities include COPD, Parkinson's disease on carbidopa -levodopa , anxiety, depression, type II DM, GERD, moderate malnutrition, hypoalbuminemia   General Weakness: PT and OT recommended discharge to SNF.  Follow-up with TOC to assist with disposition.  Awaiting placement at SNF-medically stable for the past many days   Diet Order             DIET DYS 3 Fluid consistency: Thin  Diet effective now                  Consultants: Information systems manager  Procedures: None   Medications:    (feeding supplement) PROSource Plus  30 mL Oral BID BM   vitamin C   250 mg Oral BID   busPIRone   7.5 mg Oral BID   carbidopa -levodopa   1 tablet Oral QHS   carbidopa -levodopa   1.5 tablet Oral TID   Chlorhexidine  Gluconate Cloth  6 each Topical Daily   colchicine   0.6 mg Oral Daily   cyclobenzaprine   10 mg Oral QHS   DULoxetine   60 mg Oral Daily   enoxaparin  (LOVENOX ) injection  40 mg Subcutaneous Q24H   feeding supplement  237 mL Oral TID BM   fluticasone   2 spray Each Nare Daily   gabapentin   400 mg Oral TID   ipratropium  2 spray Each Nare QID   midodrine   10 mg Oral TID WC   mirtazapine   15 mg Oral QHS   montelukast   10 mg Oral QHS   multivitamin with minerals  1 tablet Oral Daily   oxyCODONE   15 mg Oral Q12H   pantoprazole   40 mg Oral Daily   potassium chloride   40 mEq Oral Q4H   pyridostigmine   60 mg Oral Q8H   QUEtiapine   25 mg Oral QHS   sodium chloride   1 spray Each Nare Q4H   theophylline   400 mg Oral Daily   umeclidinium bromide   1 puff Inhalation Daily   Vitamin D  (Ergocalciferol )  50,000 Units Oral Q7 days   Continuous Infusions:  promethazine  (PHENERGAN ) injection (IM or IVPB) 12.5 mg (09/12/23 1622)     Anti-infectives (From admission,  onward)    Start     Dose/Rate Route Frequency Ordered Stop   09/03/23 1100  cefadroxil  (DURICEF) capsule 500 mg        500 mg Oral 2 times daily 09/03/23 1007 09/04/23 2144   08/31/23 2200  cefTRIAXone  (ROCEPHIN ) 1 g in sodium chloride  0.9 % 100 mL IVPB  Status:  Discontinued        1 g 200 mL/hr over 30 Minutes Intravenous Every 24 hours 08/31/23 2041 09/03/23 1007              Family Communication/Anticipated D/C date and plan/Code Status   DVT prophylaxis: Place TED hose Start: 09/02/23 1524 enoxaparin  (LOVENOX ) injection 40 mg Start: 08/30/23 2200     Code Status: Full Code  Family Communication: Talked with son-in-law on phone  Disposition Plan: Plan discharge to SNF   Status is: Inpatient Remains inpatient appropriate because: Awaiting placement to SNF   Subjective:  Patient continued to have diarrhea.  Again asking to go home  Objective:    Vitals:   09/12/23 2002 09/13/23 0350 09/13/23 0500 09/13/23 0815  BP: ROLLEN)  100/45 (!) 100/59  (!) 123/59  Pulse: 73 84  78  Resp: 17 14  16   Temp: 98.2 F (36.8 C) 98.1 F (36.7 C)  97.9 F (36.6 C)  TempSrc:  Oral  Oral  SpO2: 96% 97%  97%  Weight:   67.5 kg   Height:       Orthostatic VS for the past 24 hrs:  BP- Lying Pulse- Lying BP- Sitting Pulse- Sitting BP- Standing at 0 minutes Pulse- Standing at 0 minutes  09/13/23 0815 123/59 78 97/62 95 96/85 116  09/12/23 2142 -- -- -- -- (!) 62/34 86  09/12/23 2141 -- -- 98/50 75 -- --  09/12/23 2140 99/45 75 -- -- -- --     Intake/Output Summary (Last 24 hours) at 09/13/2023 1505 Last data filed at 09/13/2023 1300 Gross per 24 hour  Intake 120 ml  Output 525 ml  Net -405 ml    Filed Weights   08/28/23 1256 09/12/23 0500 09/13/23 0500  Weight: 71.7 kg 66.2 kg 67.5 kg    Exam: General.  Frail lady, in no acute distress. Pulmonary.  Lungs clear bilaterally, normal respiratory effort. CV.  Regular rate and rhythm, no JVD, rub or murmur. Abdomen.   Soft, nontender, nondistended, BS positive. CNS.  Alert and oriented .  No focal neurologic deficit. Extremities.  No edema, no cyanosis, pulses intact and symmetrical.   Data Reviewed:   I have personally reviewed following labs and imaging studies:  Labs: Labs show the following:   Basic Metabolic Panel: Recent Labs  Lab 09/11/23 0528 09/12/23 0347 09/13/23 0453  NA  --  139 141  K  --  2.4* 2.9*  CL  --  107 108  CO2  --  24 25  GLUCOSE  --  113* 109*  BUN  --  9 8  CREATININE 0.95 0.99 1.10*  CALCIUM   --  7.6* 7.7*  MG  --  1.8  --   PHOS  --  3.5  --    GFR Estimated Creatinine Clearance: 53.5 mL/min (A) (by C-G formula based on SCr of 1.1 mg/dL (H)). Liver Function Tests: No results for input(s): AST, ALT, ALKPHOS, BILITOT, PROT, ALBUMIN in the last 168 hours.  No results for input(s): LIPASE, AMYLASE in the last 168 hours. No results for input(s): AMMONIA in the last 168 hours. Coagulation profile No results for input(s): INR, PROTIME in the last 168 hours.  CBC: Recent Labs  Lab 09/13/23 1149  WBC 9.9  HGB 10.6*  HCT 32.3*  MCV 98.5  PLT 404*    Cardiac Enzymes: No results for input(s): CKTOTAL, CKMB, CKMBINDEX, TROPONINI in the last 168 hours.  BNP (last 3 results) No results for input(s): PROBNP in the last 8760 hours. CBG: No results for input(s): GLUCAP in the last 168 hours. D-Dimer: No results for input(s): DDIMER in the last 72 hours. Hgb A1c: No results for input(s): HGBA1C in the last 72 hours. Lipid Profile: No results for input(s): CHOL, HDL, LDLCALC, TRIG, CHOLHDL, LDLDIRECT in the last 72 hours. Thyroid  function studies: No results for input(s): TSH, T4TOTAL, T3FREE, THYROIDAB in the last 72 hours.  Invalid input(s): FREET3 Anemia work up: Recent Labs    09/13/23 0453  VITAMINB12 1,105*  FOLATE 15.1   Sepsis Labs: Recent Labs  Lab 09/13/23 1149  WBC 9.9      Microbiology Recent Results (from the past 240 hours)  Gastrointestinal Panel by PCR , Stool     Status: None  Collection Time: 09/13/23  9:56 AM   Specimen: Stool  Result Value Ref Range Status   Campylobacter species NOT DETECTED NOT DETECTED Final   Plesimonas shigelloides NOT DETECTED NOT DETECTED Final   Salmonella species NOT DETECTED NOT DETECTED Final   Yersinia enterocolitica NOT DETECTED NOT DETECTED Final   Vibrio species NOT DETECTED NOT DETECTED Final   Vibrio cholerae NOT DETECTED NOT DETECTED Final   Enteroaggregative E coli (EAEC) NOT DETECTED NOT DETECTED Final   Enteropathogenic E coli (EPEC) NOT DETECTED NOT DETECTED Final   Enterotoxigenic E coli (ETEC) NOT DETECTED NOT DETECTED Final   Shiga like toxin producing E coli (STEC) NOT DETECTED NOT DETECTED Final   Shigella/Enteroinvasive E coli (EIEC) NOT DETECTED NOT DETECTED Final   Cryptosporidium NOT DETECTED NOT DETECTED Final   Cyclospora cayetanensis NOT DETECTED NOT DETECTED Final   Entamoeba histolytica NOT DETECTED NOT DETECTED Final   Giardia lamblia NOT DETECTED NOT DETECTED Final   Adenovirus F40/41 NOT DETECTED NOT DETECTED Final   Astrovirus NOT DETECTED NOT DETECTED Final   Norovirus GI/GII NOT DETECTED NOT DETECTED Final   Rotavirus A NOT DETECTED NOT DETECTED Final   Sapovirus (I, II, IV, and V) NOT DETECTED NOT DETECTED Final    Comment: Performed at [facility], [address] [place], [place] [address]     Procedures and diagnostic studies:  No results found.   LOS: 15 days   This record has been created using Conservation officer, historic buildings. Errors have been sought and corrected,but may not always be located. Such creation errors do not reflect on the standard of care.   Tricia [name]  [employer] and [email or web address]. If 7PM-7AM, please contact night-coverage at [email or web address]  09/13/2023, 3:05 PM

## 2023-09-13 NOTE — Progress Notes (Signed)
 Physical Therapy Treatment Patient Details Name: Tricia [name] MRN: [id number] DOB: Dec 22, 1960 Today's Date: 09/13/2023   History of Present Illness Pt is a 63 y.o. female admitted with acute kidney injury superimposed on CKD, UTI, orthostatic hypotension with syncope, and rhabdomyolysis.  PMHx includes: COPD, Asthma, osteoarthritis, anxiety, depression, TypeII DM, Diastolic Dysfunction, GERD, HTN, Dyslipidemia, Rhabdomyolitis 2/2 fall, Parkinson's Disease, and syncope.    PT Comments  Pt was pleasant and motivated to participate during the session.  Pt put forth good effort throughout but unfortunately the session was limited by pt needing to have a BM with a history of multiple instances of diarrhea and stomach cramping this date per pt report.  Pt required no physical assist with below functional tasks but did require extra time and effort in general.  Pt was overall steady with standing activities this session with no overt LOB. Pt will benefit from continued PT services upon discharge to safely address deficits listed in patient problem list for decreased caregiver assistance and eventual return to PLOF.        If plan is discharge home, recommend the following: Assistance with cooking/housework;Assist for transportation;Help with stairs or ramp for entrance;A lot of help with walking and/or transfers;A little help with bathing/dressing/bathroom   Can travel by private vehicle     Yes  Equipment Recommendations  Other (comment) (TBD at next venue of care)    Recommendations for Other Services       Precautions / Restrictions Precautions Precautions: Fall Recall of Precautions/Restrictions: Impaired Restrictions Weight Bearing Restrictions Per Provider Order: No Other Position/Activity Restrictions: Orthostatic Hypotension     Mobility  Bed Mobility Overal bed mobility: Needs Assistance       Supine to sit: Supervision     General bed mobility comments: Min extra  time, effort, and use of bed rail only during sup to sit    Transfers Overall transfer level: Needs assistance Equipment used: Rolling walker (2 wheels) Transfers: Sit to/from Stand Sit to Stand: Contact guard assist           General transfer comment: Min extra time and effort and use of BUEs to come to standing and to control sitting but steady without LOB    Ambulation/Gait Ambulation/Gait assistance: Contact guard assist Gait Distance (Feet): 3 Feet Assistive device: Rolling walker (2 wheels)   Gait velocity: decreased     General Gait Details: Pt able to take several small, effortful steps near the EOB and from bed to [facility] with distance limited by pt needing to have a BM   Stairs             Wheelchair Mobility     Tilt Bed    Modified Rankin (Stroke Patients Only)       Balance Overall balance assessment: Needs assistance   Sitting balance-Leahy Scale: Good     Standing balance support: Bilateral upper extremity supported, During functional activity, Reliant on assistive device for balance Standing balance-Leahy Scale: Fair                              Hotel manager: No apparent difficulties  Cognition Arousal: Alert Behavior During Therapy: WFL for tasks assessed/performed   PT - Cognitive impairments: No apparent impairments                         Following commands: Intact      Cueing Cueing Techniques:  Verbal cues, Visual cues  Exercises Total Joint Exercises Ankle Circles/Pumps: Strengthening, Both, 10 reps Quad Sets: Strengthening, Both, 10 reps Towel Squeeze: Strengthening, Both, 10 reps Long Arc Quad: Strengthening, Both, 10 reps Knee Flexion: Strengthening, Both, 10 reps Marching in Standing: Strengthening, Both, 10 reps, Seated    General Comments        Pertinent Vitals/Pain Pain Assessment Pain Assessment: 0-10 Pain Score: 5  Pain Location: stomach and LBP Pain  Descriptors / Indicators: Aching, Sore Pain Intervention(s): Premedicated before session, Repositioned, Monitored during session    Home Living Family/patient expects to be discharged to:: Private residence                        Prior Function            PT Goals (current goals can now be found in the care plan section) Progress towards PT goals: PT to reassess next treatment    Frequency    Min 2X/week      PT Plan      Co-evaluation              AM-PAC PT 6 Clicks Mobility   Outcome Measure  Help needed turning from your back to your side while in a flat bed without using bedrails?: A Little Help needed moving from lying on your back to sitting on the side of a flat bed without using bedrails?: A Little Help needed moving to and from a bed to a chair (including a wheelchair)?: A Little Help needed standing up from a chair using your arms (e.g., wheelchair or bedside chair)?: A Little Help needed to walk in hospital room?: A Little Help needed climbing 3-5 steps with a railing? : A Lot 6 Click Score: 17    End of Session Equipment Utilized During Treatment: Gait belt Activity Tolerance: Patient tolerated treatment well Patient left: Other (comment) (Pt left on BSC for BM with call bell in reach) Nurse Communication: Mobility status PT Visit Diagnosis: Muscle weakness (generalized) (M62.81);Difficulty in walking, not elsewhere classified (R26.2);Pain Pain - part of body:  (abdomen and low back)     Time: 8592-8578 PT Time Calculation (min) (ACUTE ONLY): 14 min  Charges:    $Therapeutic Exercise: 8-22 mins PT General Charges $$ ACUTE PT VISIT: 1 Visit                     D. Scott Darrie Macmillan PT, DPT 09/13/23, 3:03 PM

## 2023-09-13 NOTE — Progress Notes (Signed)
 Occupational Therapy Treatment Patient Details Name: Tricia [name] MRN: [id number] DOB: December 13, 1960 Today's Date: 09/13/2023   History of present illness Pt is a 63 y.o. female admitted with acute kidney injury superimposed on CKD, UTI, orthostatic hypotension with syncope, and rhabdomyolysis.  PMHx includes: COPD, Asthma, osteoarthritis, anxiety, depression, TypeII DM, Diastolic Dysfunction, GERD, HTN, Dyslipidemia, Rhabdomyolitis 2/2 fall, Parkinson's Disease, and syncope.   OT comments  Upon entering the room, pt supine in bed and agreeable to OT intervention. Pt verbalizes need for toileting. Supervision for bed mobility and min guard for stand with RW and several steps to recliner chair. While seated, OT assisted pt with threading brief onto B feet and pt able to perform hygiene and pull over B hips before returning back to bed at end of session. All needs within reach upon exiting the room.       If plan is discharge home, recommend the following:  A little help with walking and/or transfers;A lot of help with bathing/dressing/bathroom;Direct supervision/assist for medications management;Supervision due to cognitive status;Direct supervision/assist for financial management;Assistance with cooking/housework;Assist for transportation;Help with stairs or ramp for entrance   Equipment Recommendations  Other (comment)       Precautions / Restrictions Precautions Precautions: Fall Recall of Precautions/Restrictions: Impaired       Mobility Bed Mobility Overal bed mobility: Needs Assistance Bed Mobility: Supine to Sit, Sit to Supine     Supine to sit: Supervision Sit to supine: Supervision        Transfers Overall transfer level: Needs assistance Equipment used: Rolling walker (2 wheels) Transfers: Sit to/from Stand Sit to Stand: Contact guard assist     Step pivot transfers: Contact guard assist           Balance Overall balance assessment: Needs  assistance Sitting-balance support: Feet supported Sitting balance-Leahy Scale: Good     Standing balance support: Bilateral upper extremity supported, During functional activity, Reliant on assistive device for balance Standing balance-Leahy Scale: Fair                             ADL either performed or assessed with clinical judgement   ADL Overall ADL's : Needs assistance/impaired                         Toilet Transfer: Contact guard assist;Stand-pivot;BSC/3in1   Toileting- Clothing Manipulation and Hygiene: Minimal assistance Toileting - Clothing Manipulation Details (indicate cue type and reason): to don mesh breif     Functional mobility during ADLs: Contact guard assist;Rolling walker (2 wheels)      Extremity/Trunk Assessment Upper Extremity Assessment Upper Extremity Assessment: Generalized weakness   Lower Extremity Assessment Lower Extremity Assessment: Generalized weakness        Vision Patient Visual Report: No change from baseline           Communication Communication Communication: No apparent difficulties   Cognition Arousal: Alert Behavior During Therapy: WFL for tasks assessed/performed Cognition: No family/caregiver present to determine baseline                               Following commands: Intact Following commands impaired: Only follows one step commands consistently, Follows one step commands with increased time      Cueing   Cueing Techniques: Verbal cues, Visual cues             Pertinent Vitals/  Pain       Pain Assessment Pain Assessment: Faces Faces Pain Scale: Hurts a little bit Pain Location: cramping Pain Descriptors / Indicators: Aching, Sore Pain Intervention(s): Premedicated before session, Limited activity within patient's tolerance, Monitored during session, Repositioned  Home Living Family/patient expects to be discharged to:: Private residence                                             Frequency  Min 2X/week        Progress Toward Goals  OT Goals(current goals can now be found in the care plan section)  Progress towards OT goals: Progressing toward goals  Acute Rehab OT Goals Patient Stated Goal: to get better OT Goal Formulation: With patient Time For Goal Achievement: 09/14/23 Potential to Achieve Goals: Good  Plan         AM-PAC OT 6 Clicks Daily Activity     Outcome Measure   Help from another person eating meals?: None Help from another person taking care of personal grooming?: None Help from another person toileting, which includes using toliet, bedpan, or urinal?: A Lot Help from another person bathing (including washing, rinsing, drying)?: A Lot Help from another person to put on and taking off regular upper body clothing?: A Little Help from another person to put on and taking off regular lower body clothing?: A Lot 6 Click Score: 17    End of Session Equipment Utilized During Treatment: Rolling walker (2 wheels)  OT Visit Diagnosis: Unsteadiness on feet (R26.81);Muscle weakness (generalized) (M62.81);History of falling (Z91.81)   Activity Tolerance Patient tolerated treatment well   Patient Left in bed;with call bell/phone within reach;with bed alarm set   Nurse Communication Mobility status        Time: 8545-8484 OT Time Calculation (min): 21 min  Charges: OT General Charges $OT Visit: 1 Visit OT Treatments $Self Care/Home Management : 8-22 mins  Izetta Claude, MS, OTR/L , CBIS ascom [phone]  09/13/23, 4:05 PM

## 2023-09-13 NOTE — Plan of Care (Addendum)
 AOx4. Patient c/o abdominal cramping, nausea during the night- Zofran  2x,  Immodium 3x. Patient multiple BMs during the night. Stool becoming more formed this morning, small though still with mucus. Foley patent- informed provider of charted amounts as well as BP 100/59 this morning per orders. Positive orthostatic VS continue.Safety measures in place throughout the night. Patient desires to speak with provider this morning concerning discharge d/t transportation schedules by family members.  Problem: Education: Goal: Knowledge of General Education information will improve Description: Including pain rating scale, medication(s)/side effects and non-pharmacologic comfort measures Outcome: Progressing   Problem: Education: Goal: Knowledge of General Education information will improve Description: Including pain rating scale, medication(s)/side effects and non-pharmacologic comfort measures Outcome: Progressing   Problem: Clinical Measurements: Goal: Respiratory complications will improve Outcome: Progressing   Problem: Clinical Measurements: Goal: Respiratory complications will improve Outcome: Progressing

## 2023-09-13 NOTE — TOC Progression Note (Addendum)
 Transition of Care [facility]) - Progression Note    Patient Details  Name: Tricia [name] MRN: [id number] Date of Birth: 27-Mar-1960  Transition of [facility]) CM/SW Contact  [name], RN 09/13/2023, 3:02 PM  Clinical Narrative:      CM met with patient's granddaughter, Tricia [name] today regarding caregiver support and possible guardianship. Per Tricia [name], cannot provide caregiver support to patient and discussed caregiver support assistance with patient's son in law, Tricia, truck Hospital doctor. Per patient's granddaughter, patient's son in law, Tricia states patient's granddaughter, Tricia [name] can make the decision regarding patient's care. Per patient's granddaughter prefers to have appointment a guardian for medical decisions.   CM call attempt x 2 to [facility] APS, phone: [phone], option 1. Line busy.  Expected Discharge Plan: Skilled Nursing Facility Barriers to Discharge: Continued Medical Work up    Expected Discharge Plan and Services    SNF   Living arrangements for the past 2 months: Single Family Home                  Social Drivers of Health (SDOH) Interventions SDOH Screenings   Food Insecurity: No Food Insecurity (08/29/2023)  Housing: Low Risk  (08/29/2023)  Transportation Needs: No Transportation Needs (08/29/2023)  Utilities: Not At Risk (08/29/2023)  Alcohol Screen: Low Risk  (03/07/2023)  Depression (PHQ2-9): High Risk (05/01/2023)  Financial Resource Strain: Patient Declined (06/18/2023)   Received from [facility]  Physical Activity: Sufficiently Active (03/07/2023)  Social Connections: Socially Isolated (08/29/2023)  Stress: No Stress Concern Present (03/07/2023)  Tobacco Use: High Risk (08/28/2023)  Health Literacy: Adequate Health Literacy (03/07/2023)    Readmission Risk Interventions     No data to display

## 2023-09-13 NOTE — Plan of Care (Signed)

## 2023-09-14 DIAGNOSIS — E785 Hyperlipidemia, unspecified: Secondary | ICD-10-CM | POA: Diagnosis not present

## 2023-09-14 DIAGNOSIS — R55 Syncope and collapse: Secondary | ICD-10-CM | POA: Diagnosis not present

## 2023-09-14 DIAGNOSIS — R748 Abnormal levels of other serum enzymes: Secondary | ICD-10-CM | POA: Diagnosis not present

## 2023-09-14 DIAGNOSIS — N179 Acute kidney failure, unspecified: Secondary | ICD-10-CM | POA: Diagnosis not present

## 2023-09-14 LAB — BASIC METABOLIC PANEL WITH GFR
Anion gap: 7 (ref 5–15)
BUN: 11 mg/dL (ref 8–23)
CO2: 21 mmol/L — ABNORMAL LOW (ref 22–32)
Calcium: 7.7 mg/dL — ABNORMAL LOW (ref 8.9–10.3)
Chloride: 112 mmol/L — ABNORMAL HIGH (ref 98–111)
Creatinine, Ser: 0.93 mg/dL (ref 0.44–1.00)
GFR, Estimated: >60 mL/min (ref >60)
Glucose, Bld: 109 mg/dL — ABNORMAL HIGH (ref 70–99)
Potassium: 4 mmol/L (ref 3.5–5.1)
Sodium: 140 mmol/L (ref 135–145)

## 2023-09-14 MED ORDER — CALCIUM CARBONATE 1250 (500 CA) MG PO TABS
500.0000 mg | ORAL_TABLET | Freq: Two times a day (BID) | ORAL | Status: DC
  Administered 2023-09-14 – 2023-09-18 (×8): 1250 mg via ORAL
  Filled 2023-09-14 (×9): qty 1

## 2023-09-14 MED ORDER — MIDODRINE HCL 5 MG PO TABS
5.0000 mg | ORAL_TABLET | Freq: Three times a day (TID) | ORAL | Status: DC
  Administered 2023-09-14 – 2023-09-18 (×12): 5 mg via ORAL
  Filled 2023-09-14 (×12): qty 1

## 2023-09-14 NOTE — TOC Progression Note (Signed)
 Transition of Care [facility]) - Progression Note    Patient Details  Name: Tricia [name] MRN: [id number] Date of Birth: [birth year]  [facility]) CM/SW Contact  Lorraine LILLETTE Fenton, LCSW Phone Number: 09/14/2023, 12:36 PM  Clinical Narrative:    CSW filed APS report after speaking to grandaughter.  Grandaughter shared that she is 63 years old and lives 45 minutes away and not able to provide the 24/7 care and understands that APS referral will be made.     Expected Discharge Plan: Skilled Nursing Facility Barriers to Discharge: Continued Medical Work up               Expected Discharge Plan and Services       Living arrangements for the past 2 months: Single Family Home                                       Social Drivers of Health (SDOH) Interventions SDOH Screenings   Food Insecurity: No Food Insecurity (08/29/2023)  Housing: Low Risk  (08/29/2023)  Transportation Needs: No Transportation Needs (08/29/2023)  Utilities: Not At Risk (08/29/2023)  Alcohol Screen: Low Risk  (03/07/2023)  Depression (PHQ2-9): High Risk (05/01/2023)  Financial Resource Strain: Patient Declined (06/18/2023)   Received from [facility]  Physical Activity: Sufficiently Active (03/07/2023)  Social Connections: Socially Isolated (08/29/2023)  Stress: No Stress Concern Present (03/07/2023)  Tobacco Use: High Risk (08/28/2023)  Health Literacy: Adequate Health Literacy (03/07/2023)    Readmission Risk Interventions     No data to display

## 2023-09-14 NOTE — Plan of Care (Signed)
  Problem: Education: Goal: Knowledge of General Education information will improve Description: Including pain rating scale, medication(s)/side effects and non-pharmacologic comfort measures 09/14/2023 0043 by Lennie Rodgers BIRCH, RN Outcome: Progressing 09/14/2023 0041 by Lennie Rodgers BIRCH, RN Outcome: Progressing   Problem: Health Behavior/Discharge Planning: Goal: Ability to manage health-related needs will improve 09/14/2023 0043 by Lennie Rodgers BIRCH, RN Outcome: Progressing 09/14/2023 0041 by Lennie Rodgers BIRCH, RN Outcome: Progressing   Problem: Clinical Measurements: Goal: Ability to maintain clinical measurements within normal limits will improve 09/14/2023 0043 by Lennie Rodgers BIRCH, RN Outcome: Progressing 09/14/2023 0041 by Lennie Rodgers BIRCH, RN Outcome: Progressing Goal: Will remain free from infection 09/14/2023 0043 by Lennie Rodgers BIRCH, RN Outcome: Progressing 09/14/2023 0041 by Lennie Rodgers BIRCH, RN Outcome: Progressing Goal: Diagnostic test results will improve 09/14/2023 0043 by Lennie Rodgers BIRCH, RN Outcome: Progressing 09/14/2023 0041 by Lennie Rodgers BIRCH, RN Outcome: Progressing Goal: Respiratory complications will improve 09/14/2023 0043 by Lennie Rodgers BIRCH, RN Outcome: Progressing 09/14/2023 0041 by Lennie Rodgers BIRCH, RN Outcome: Progressing Goal: Cardiovascular complication will be avoided 09/14/2023 0043 by Lennie Rodgers BIRCH, RN Outcome: Progressing 09/14/2023 0041 by Lennie Rodgers BIRCH, RN Outcome: Progressing   Problem: Activity: Goal: Risk for activity intolerance will decrease 09/14/2023 0043 by Lennie Rodgers BIRCH, RN Outcome: Progressing 09/14/2023 0041 by Lennie Rodgers BIRCH, RN Outcome: Progressing   Problem: Nutrition: Goal: Adequate nutrition will be maintained 09/14/2023 0043 by Lennie Rodgers BIRCH, RN Outcome: Progressing 09/14/2023 0041 by Lennie Rodgers BIRCH, RN Outcome: Progressing   Problem: Coping: Goal: Level of anxiety will decrease 09/14/2023 0043 by Lennie Rodgers BIRCH, RN Outcome: Progressing 09/14/2023 0041 by Lennie Rodgers BIRCH, RN Outcome: Progressing   Problem: Elimination: Goal: Will not experience complications related to bowel motility 09/14/2023 0043 by Lennie Rodgers BIRCH, RN Outcome: Progressing 09/14/2023 0041 by Lennie Rodgers BIRCH, RN Outcome: Progressing Goal: Will not experience complications related to urinary retention 09/14/2023 0043 by Lennie Rodgers BIRCH, RN Outcome: Progressing 09/14/2023 0041 by Lennie Rodgers BIRCH, RN Outcome: Progressing   Problem: Pain Managment: Goal: General experience of comfort will improve and/or be controlled 09/14/2023 0043 by Lennie Rodgers BIRCH, RN Outcome: Progressing 09/14/2023 0041 by Lennie Rodgers BIRCH, RN Outcome: Progressing   Problem: Safety: Goal: Ability to remain free from injury will improve 09/14/2023 0043 by Lennie Rodgers BIRCH, RN Outcome: Progressing 09/14/2023 0041 by Lennie Rodgers BIRCH, RN Outcome: Progressing   Problem: Skin Integrity: Goal: Risk for impaired skin integrity will decrease 09/14/2023 0043 by Lennie Rodgers BIRCH, RN Outcome: Progressing 09/14/2023 0041 by Lennie Rodgers BIRCH, RN Outcome: Progressing

## 2023-09-14 NOTE — Plan of Care (Signed)

## 2023-09-14 NOTE — Significant Event (Signed)
       CROSS COVER NOTE  NAME: Tricia [name] MRN: [id number] DOB : 10/30/1960 ATTENDING PHYSICIAN: Caleen Qualia, MD    Date of Service   09/14/2023   HPI/Events of Note   Message received from nurse via secure chat this pt had a fall. she is not hurt. she was trying to get to the bedside commode. she says she fell into the recliner in her room.   Interventions   Assessment/Plan:    09/14/2023    2:39 AM 09/13/2023    8:29 PM 09/13/2023    3:18 PM  Vitals with BMI  Systolic 127 145 860  Diastolic 68 65 71  Pulse 78 84 73   Patient with full ROM. No areas of pain or abrasions Continue fall  precautions        Erminio LITTIE Cone NP [facility] Cross Cover 7pm-7am - check amion for availability Pager [phone]

## 2023-09-14 NOTE — Progress Notes (Signed)
 Progress Note    Tricia [name]  FMW:[id number] DOB: 1960/06/03  DOA: 08/28/2023 PCP: Sowles, Krichna, MD      Brief Narrative:    Medical records reviewed and are as summarized below:  Tricia [name] is a 63 y.o. female with chronic pain, COPD, asthma, osteoarthritis, anxiety, depression, type 2 diabetes, diastolic CHF, GERD, hypertension, dyslipidemia, who presents to the ED with syncopal episode.  Initial vitals in the ED are 125/100, creatinine elevation of 2.55, CK  659.  Noncon head CT without acute intracranial abnormalities.  Patient received 2 L NS and was admitted.  Gradually her mental status has improved but she remains intermittently altered.  We have significant concerns about her ability to take care of herself at home.   7/23: Remained hemodynamically stable.  No TOC note for placement-sent another message.   7/24: Remained hemodynamically stable, patient does not have capacity to make decision per psychiatry-she was evaluated on 7/15 for capacity.  She wants to go home but unsafe environment with frequent falls and mixing up of medications.  Another message sent to [facility] as she is medically stable for the past many days with unnecessary length of stay now.  7/25: Hemodynamically stable but continued to have diarrhea-GI pathogen panel was negative.  No leukocytosis, mild hypokalemia which is being repleted.  Borderline magnesium  at 1.8.  B12 and folate levels were normal, vitamin D  low-starting on supplement. TOC still need to work on placement.  Patient remained medically stable for discharge.  7/26: Hemodynamically stable with mildly elevated blood pressure so decreasing the dose of midodrine  to 5 mg 3 times daily.  Discontinuing pyridostigmine  as it might be the cause of diarrhea and abdominal cramps.  Patient would like to discontinue Foley catheter with the hope that she can void now, ordered removal of Foley catheter to give her a voiding  trial.  Overnight fall while getting out of the bed, patient felt in her recliner-no reported injuries or loss of consciousness.   Assessment/Plan:   Principal Problem:   Acute kidney injury superimposed on chronic kidney disease (HCC) Active Problems:   Syncope, vasovagal   Elevated CK   Dyslipidemia   GERD without esophagitis   Parkinson disease (HCC)   Anxiety and depression   Chronic obstructive pulmonary disease (COPD) (HCC)   Type 2 diabetes mellitus with stage 3 chronic kidney disease (HCC)   Malnutrition of moderate degree   Nutrition Problem: Moderate Malnutrition Etiology: chronic illness (COPD, Parkinson's disease)  Signs/Symptoms: mild fat depletion, moderate fat depletion, mild muscle depletion, moderate muscle depletion   Body mass index is 22.63 kg/m.   S/p syncope: There is probably from orthostatic hypotension. S/p treatment with IV fluids. 2D echo showed preserved EF, grade 1 diastolic dysfunction.   Recurrent orthostatic hypotension: This may be chronic.  Risk factors for this include polypharmacy, diabetes mellitus and Parkinson's disease. Decreasing the dose of midodrine  to 5 mg 3 times daily as blood pressure started trending up - Discontinuing pyridostigmine  as it might be the cause of her diarrhea and abdominal cramps - Monitor orthostatic vitals  Polypharmacy, chronic pain, opioid dependence: - Patient is on multiple psychiatric medications, duplicate antihistamine therapy, as well as chronic opioid therapy - Home meds include: BuSpar , Cymbalta , Flexeril , Xyzal , gabapentin , morphine , MS Contin , Seroquel , hydroxyzine . - Hydroxyzine  and immediate release morphine  have already been discontinued.    - Continue OxyContin  for now.   Outpatient follow-up with pain clinic for de-escalation of opioids strongly recommended.  Urinary retention: Foley catheter placed after multiple In-N-Out ureteral catheterization.  Failed voiding trial on 09/01/2023.   She was evaluated by the urologist on 09/02/2023.  Foley catheter has been replaced.  It was recommended that patient be discharged on Foley catheter.  Outpatient follow-up with urologist for voiding trial. - Giving another voiding trial at her request today  Acute UTI: S/p treatment with IV ceftriaxone  followed by PO cefadroxil .  Completed a course of antibiotic Urine culture from 08/31/2023 showed Klebsiella pneumoniae and Proteus mirabilis.   AKI, probable CKD stage IIIa: Creatinine is stable.  Elevated CK: Does not meet criteria for rhabdomyolysis.  CK improved from 659-547.  Hypokalemia: Resolved with potassium of 4 today  Hypomagnesemia: Magnesium  at 1.8 today. - Giving some more magnesium   Hypophosphatemia: Repleted with potassium phosphate   Diarrhea: Imodium  as needed.  No indication for C. difficile testing at this time.  GI pathogen panel negative   Comorbidities include COPD, Parkinson's disease on carbidopa -levodopa , anxiety, depression, type II DM, GERD, moderate malnutrition, hypoalbuminemia   General Weakness: PT and OT recommended discharge to SNF.  Follow-up with TOC to assist with disposition.  Awaiting placement at SNF-medically stable for the past many days   Diet Order             DIET DYS 3 Fluid consistency: Thin  Diet effective now                  Consultants: Information systems manager  Procedures: None   Medications:    (feeding supplement) PROSource Plus  30 mL Oral BID BM   vitamin C   250 mg Oral BID   busPIRone   7.5 mg Oral BID   calcium  carbonate  500 mg of elemental calcium  Oral BID WC   carbidopa -levodopa   1 tablet Oral QHS   carbidopa -levodopa   1.5 tablet Oral TID   Chlorhexidine  Gluconate Cloth  6 each Topical Daily   colchicine   0.6 mg Oral Daily   cyclobenzaprine   10 mg Oral QHS   dicyclomine   10 mg Oral TID AC & HS   DULoxetine   60 mg Oral Daily   enoxaparin  (LOVENOX ) injection  40 mg Subcutaneous Q24H   feeding supplement   237 mL Oral TID BM   fluticasone   2 spray Each Nare Daily   gabapentin   400 mg Oral TID   ipratropium  2 spray Each Nare QID   midodrine   5 mg Oral TID WC   mirtazapine   15 mg Oral QHS   montelukast   10 mg Oral QHS   multivitamin with minerals  1 tablet Oral Daily   oxyCODONE   15 mg Oral Q12H   pantoprazole   40 mg Oral Daily   QUEtiapine   25 mg Oral QHS   sodium chloride   1 spray Each Nare Q4H   theophylline   400 mg Oral Daily   umeclidinium bromide   1 puff Inhalation Daily   Vitamin D  (Ergocalciferol )  50,000 Units Oral Q7 days   Continuous Infusions:  promethazine  (PHENERGAN ) injection (IM or IVPB) 12.5 mg (09/12/23 1622)     Anti-infectives (From admission, onward)    Start     Dose/Rate Route Frequency Ordered Stop   09/03/23 1100  cefadroxil  (DURICEF) capsule 500 mg        500 mg Oral 2 times daily 09/03/23 1007 09/04/23 2144   08/31/23 2200  cefTRIAXone  (ROCEPHIN ) 1 g in sodium chloride  0.9 % 100 mL IVPB  Status:  Discontinued        1 g 200 mL/hr  over 30 Minutes Intravenous Every 24 hours 08/31/23 2041 09/03/23 1007              Family Communication/Anticipated D/C date and plan/Code Status   DVT prophylaxis: Place TED hose Start: 09/02/23 1524 enoxaparin  (LOVENOX ) injection 40 mg Start: 08/30/23 2200     Code Status: Full Code  Family Communication: No family at bedside  Disposition Plan: Plan discharge to SNF   Status is: Inpatient Remains inpatient appropriate because: Awaiting placement to SNF   Subjective:  Patient was complaining of lower abdominal cramps, she thinks that Foley catheter is bothering her and wants it to be removed.  Diarrhea seems improving.  Objective:    Vitals:   09/13/23 1518 09/13/23 2029 09/14/23 0239 09/14/23 0720  BP: 139/71 (!) 145/65 127/68 128/62  Pulse: 73 84 78 80  Resp: 16 20 20 17   Temp: 97.9 F (36.6 C) 97.8 F (36.6 C) 97.8 F (36.6 C) 98 F (36.7 C)  TempSrc: Oral Oral Oral   SpO2: 100% 100% 100%  98%  Weight:      Height:       No data found.    Intake/Output Summary (Last 24 hours) at 09/14/2023 1444 Last data filed at 09/14/2023 1300 Gross per 24 hour  Intake 530 ml  Output 625 ml  Net -95 ml    Filed Weights   08/28/23 1256 09/12/23 0500 09/13/23 0500  Weight: 71.7 kg 66.2 kg 67.5 kg    Exam: General.  Frail elderly lady, in no acute distress. Pulmonary.  Lungs clear bilaterally, normal respiratory effort. CV.  Regular rate and rhythm, no JVD, rub or murmur. Abdomen.  Soft, nontender, nondistended, BS positive. CNS.  Alert and oriented .  No focal neurologic deficit. Extremities.  No edema, no cyanosis, pulses intact and symmetrical.  Data Reviewed:   I have personally reviewed following labs and imaging studies:  Labs: Labs show the following:   Basic Metabolic Panel: Recent Labs  Lab 09/11/23 0528 09/12/23 0347 09/12/23 0347 09/13/23 0453 09/14/23 0529  NA  --  139  --  141 140  K  --  2.4*   < > 2.9* 4.0  CL  --  107  --  108 112*  CO2  --  24  --  25 21*  GLUCOSE  --  113*  --  109* 109*  BUN  --  9  --  8 11  CREATININE 0.95 0.99  --  1.10* 0.93  CALCIUM   --  7.6*  --  7.7* 7.7*  MG  --  1.8  --   --   --   PHOS  --  3.5  --   --   --    < > = values in this interval not displayed.   GFR Estimated Creatinine Clearance: 63.3 mL/min (by C-G formula based on SCr of 0.93 mg/dL). Liver Function Tests: No results for input(s): AST, ALT, ALKPHOS, BILITOT, PROT, ALBUMIN in the last 168 hours.  No results for input(s): LIPASE, AMYLASE in the last 168 hours. No results for input(s): AMMONIA in the last 168 hours. Coagulation profile No results for input(s): INR, PROTIME in the last 168 hours.  CBC: Recent Labs  Lab 09/13/23 1149  WBC 9.9  HGB 10.6*  HCT 32.3*  MCV 98.5  PLT 404*    Cardiac Enzymes: No results for input(s): CKTOTAL, CKMB, CKMBINDEX, TROPONINI in the last 168 hours.  BNP (last 3  results) No results for input(s): PROBNP in  the last 8760 hours. CBG: No results for input(s): GLUCAP in the last 168 hours. D-Dimer: No results for input(s): DDIMER in the last 72 hours. Hgb A1c: No results for input(s): HGBA1C in the last 72 hours. Lipid Profile: No results for input(s): CHOL, HDL, LDLCALC, TRIG, CHOLHDL, LDLDIRECT in the last 72 hours. Thyroid  function studies: No results for input(s): TSH, T4TOTAL, T3FREE, THYROIDAB in the last 72 hours.  Invalid input(s): FREET3 Anemia work up: Recent Labs    09/13/23 0453  VITAMINB12 1,105*  FOLATE 15.1   Sepsis Labs: Recent Labs  Lab 09/13/23 1149  WBC 9.9     Microbiology Recent Results (from the past 240 hours)  Gastrointestinal Panel by PCR , Stool     Status: None   Collection Time: 09/13/23  9:56 AM   Specimen: Stool  Result Value Ref Range Status   Campylobacter species NOT DETECTED NOT DETECTED Final   Plesimonas shigelloides NOT DETECTED NOT DETECTED Final   Salmonella species NOT DETECTED NOT DETECTED Final   Yersinia enterocolitica NOT DETECTED NOT DETECTED Final   Vibrio species NOT DETECTED NOT DETECTED Final   Vibrio cholerae NOT DETECTED NOT DETECTED Final   Enteroaggregative E coli (EAEC) NOT DETECTED NOT DETECTED Final   Enteropathogenic E coli (EPEC) NOT DETECTED NOT DETECTED Final   Enterotoxigenic E coli (ETEC) NOT DETECTED NOT DETECTED Final   Shiga like toxin producing E coli (STEC) NOT DETECTED NOT DETECTED Final   Shigella/Enteroinvasive E coli (EIEC) NOT DETECTED NOT DETECTED Final   Cryptosporidium NOT DETECTED NOT DETECTED Final   Cyclospora cayetanensis NOT DETECTED NOT DETECTED Final   Entamoeba histolytica NOT DETECTED NOT DETECTED Final   Giardia lamblia NOT DETECTED NOT DETECTED Final   Adenovirus F40/41 NOT DETECTED NOT DETECTED Final   Astrovirus NOT DETECTED NOT DETECTED Final   Norovirus GI/GII NOT DETECTED NOT DETECTED Final   Rotavirus A NOT  DETECTED NOT DETECTED Final   Sapovirus (I, II, IV, and V) NOT DETECTED NOT DETECTED Final    Comment: Performed at [facility], [address] [place], [place] [address]     Procedures and diagnostic studies:  No results found.   LOS: 16 days   This record has been created using Conservation officer, historic buildings. Errors have been sought and corrected,but may not always be located. Such creation errors do not reflect on the standard of care.   Amaryllis Dare  [employer] and [email or web address]. If 7PM-7AM, please contact night-coverage at [email or web address]  09/14/2023, 2:44 PM

## 2023-09-14 NOTE — Plan of Care (Signed)
   Problem: Education: Goal: Knowledge of General Education information will improve Description: Including pain rating scale, medication(s)/side effects and non-pharmacologic comfort measures Outcome: Progressing   Problem: Activity: Goal: Risk for activity intolerance will decrease Outcome: Progressing   Problem: Nutrition: Goal: Adequate nutrition will be maintained Outcome: Progressing

## 2023-09-15 DIAGNOSIS — E785 Hyperlipidemia, unspecified: Secondary | ICD-10-CM | POA: Diagnosis not present

## 2023-09-15 DIAGNOSIS — R748 Abnormal levels of other serum enzymes: Secondary | ICD-10-CM | POA: Diagnosis not present

## 2023-09-15 DIAGNOSIS — N179 Acute kidney failure, unspecified: Secondary | ICD-10-CM | POA: Diagnosis not present

## 2023-09-15 DIAGNOSIS — R55 Syncope and collapse: Secondary | ICD-10-CM | POA: Diagnosis not present

## 2023-09-15 LAB — BASIC METABOLIC PANEL WITH GFR
Anion gap: 7 (ref 5–15)
Anion gap: 8 (ref 5–15)
BUN: 14 mg/dL (ref 8–23)
BUN: 17 mg/dL (ref 8–23)
CO2: 21 mmol/L — ABNORMAL LOW (ref 22–32)
CO2: 23 mmol/L (ref 22–32)
Calcium: 7.6 mg/dL — ABNORMAL LOW (ref 8.9–10.3)
Calcium: 7.9 mg/dL — ABNORMAL LOW (ref 8.9–10.3)
Chloride: 111 mmol/L (ref 98–111)
Chloride: 112 mmol/L — ABNORMAL HIGH (ref 98–111)
Creatinine, Ser: 0.85 mg/dL (ref 0.44–1.00)
Creatinine, Ser: 1.05 mg/dL — ABNORMAL HIGH (ref 0.44–1.00)
GFR, Estimated: >60 mL/min (ref >60)
GFR, Estimated: >60 mL/min (ref >60)
Glucose, Bld: 124 mg/dL — ABNORMAL HIGH (ref 70–99)
Glucose, Bld: 133 mg/dL — ABNORMAL HIGH (ref 70–99)
Potassium: 3.1 mmol/L — ABNORMAL LOW (ref 3.5–5.1)
Potassium: 3.8 mmol/L (ref 3.5–5.1)
Sodium: 139 mmol/L (ref 135–145)
Sodium: 143 mmol/L (ref 135–145)

## 2023-09-15 LAB — CBC
HCT: 30.5 % — ABNORMAL LOW (ref 36.0–46.0)
Hemoglobin: 9.9 g/dL — ABNORMAL LOW (ref 12.0–15.0)
MCH: 32.8 pg (ref 26.0–34.0)
MCHC: 32.5 g/dL (ref 30.0–36.0)
MCV: 101 fL — ABNORMAL HIGH (ref 80.0–100.0)
Platelets: 302 K/uL (ref 150–400)
RBC: 3.02 MIL/uL — ABNORMAL LOW (ref 3.87–5.11)
RDW: 18.9 % — ABNORMAL HIGH (ref 11.5–15.5)
WBC: 9 K/uL (ref 4.0–10.5)
nRBC: 0 % (ref 0.0–0.2)

## 2023-09-15 LAB — URINALYSIS, COMPLETE (UACMP) WITH MICROSCOPIC
Bilirubin Urine: NEGATIVE
Glucose, UA: NEGATIVE mg/dL
Hgb urine dipstick: NEGATIVE
Ketones, ur: 5 mg/dL — AB
Nitrite: NEGATIVE
Protein, ur: 100 mg/dL — AB
Specific Gravity, Urine: 1.015 (ref 1.005–1.030)
WBC, UA: >50 WBC/hpf (ref 0–5)
pH: 5 (ref 5.0–8.0)

## 2023-09-15 MED ORDER — LACTATED RINGERS IV SOLN: INTRAVENOUS | Status: AC

## 2023-09-15 MED ORDER — POTASSIUM CHLORIDE CRYS ER 20 MEQ PO TBCR
40.0000 meq | EXTENDED_RELEASE_TABLET | ORAL | Status: AC
  Administered 2023-09-15 (×2): 40 meq via ORAL
  Filled 2023-09-15 (×2): qty 2

## 2023-09-15 NOTE — Plan of Care (Signed)

## 2023-09-15 NOTE — Progress Notes (Signed)
 Progress Note    Tricia [name]  FMW:[id number] DOB: 09/12/1960  DOA: 08/28/2023 PCP: Sowles, Krichna, MD      Brief Narrative:    Medical records reviewed and are as summarized below:  Tricia [name] is a 63 y.o. female with chronic pain, COPD, asthma, osteoarthritis, anxiety, depression, type 2 diabetes, diastolic CHF, GERD, hypertension, dyslipidemia, who presents to the ED with syncopal episode.  Initial vitals in the ED are 125/100, creatinine elevation of 2.55, CK  659.  Noncon head CT without acute intracranial abnormalities.  Patient received 2 L NS and was admitted.  Gradually her mental status has improved but she remains intermittently altered.  We have significant concerns about her ability to take care of herself at home.   7/23: Remained hemodynamically stable.  No TOC note for placement-sent another message.   7/24: Remained hemodynamically stable, patient does not have capacity to make decision per psychiatry-she was evaluated on 7/15 for capacity.  She wants to go home but unsafe environment with frequent falls and mixing up of medications.  Another message sent to [facility] as she is medically stable for the past many days with unnecessary length of stay now.  7/25: Hemodynamically stable but continued to have diarrhea-GI pathogen panel was negative.  No leukocytosis, mild hypokalemia which is being repleted.  Borderline magnesium  at 1.8.  B12 and folate levels were normal, vitamin D  low-starting on supplement. TOC still need to work on placement.  Patient remained medically stable for discharge.  7/26: Hemodynamically stable with mildly elevated blood pressure so decreasing the dose of midodrine  to 5 mg 3 times daily.  Discontinuing pyridostigmine  as it might be the cause of diarrhea and abdominal cramps.  Patient would like to discontinue Foley catheter with the hope that she can void now, ordered removal of Foley catheter to give her a voiding  trial.  Overnight fall while getting out of the bed, patient felt in her recliner-no reported injuries or loss of consciousness.  7/27: Continued to have crampy abdominal pain, significant urinary retention with postvoid volume of about 700 cc-one-time In-N-Out catheter was ordered, might need to replace Foley if she continued to have significant postvoid volume.  Patient wants to avoid catheter placement if possible. UA was sent as nursing concern of some purulence during In-N-Out catheter-UA pretty much the same as before with no leukocytosis so we will hold off antibiotics.   Assessment/Plan:   Principal Problem:   Acute kidney injury superimposed on chronic kidney disease (HCC) Active Problems:   Syncope, vasovagal   Elevated CK   Dyslipidemia   GERD without esophagitis   Parkinson disease (HCC)   Anxiety and depression   Chronic obstructive pulmonary disease (COPD) (HCC)   Type 2 diabetes mellitus with stage 3 chronic kidney disease (HCC)   Malnutrition of moderate degree   Nutrition Problem: Moderate Malnutrition Etiology: chronic illness (COPD, Parkinson's disease)  Signs/Symptoms: mild fat depletion, moderate fat depletion, mild muscle depletion, moderate muscle depletion   Body mass index is 22.63 kg/m.   S/p syncope: There is probably from orthostatic hypotension. S/p treatment with IV fluids. 2D echo showed preserved EF, grade 1 diastolic dysfunction.   Recurrent orthostatic hypotension: This may be chronic.  Risk factors for this include polypharmacy, diabetes mellitus and Parkinson's disease. Decreasing the dose of midodrine  to 5 mg 3 times daily as blood pressure started trending up - Discontinuing pyridostigmine  as it might be the cause of her diarrhea and abdominal cramps - Monitor  orthostatic vitals  Polypharmacy, chronic pain, opioid dependence: - Patient is on multiple psychiatric medications, duplicate antihistamine therapy, as well as chronic opioid  therapy - Home meds include: BuSpar , Cymbalta , Flexeril , Xyzal , gabapentin , morphine , MS Contin , Seroquel , hydroxyzine . - Hydroxyzine  and immediate release morphine  have already been discontinued.    - Continue OxyContin  for now.   Outpatient follow-up with pain clinic for de-escalation of opioids strongly recommended.   Urinary retention: Foley catheter placed after multiple In-N-Out ureteral catheterization.  Failed voiding trial on 09/01/2023.  She was evaluated by the urologist on 09/02/2023.  Foley catheter has been replaced.  It was recommended that patient be discharged on Foley catheter.  Outpatient follow-up with urologist for voiding trial. - Foley catheter was again removed on 7/26-patient was having significant postvoid urinary retention needing one-time In-N-Out catheter, she wants to avoid Foley catheter if possible. If continue to have significant urinary retention then Foley need to be replaced and she will follow-up with urology as outpatient.  Acute UTI: S/p treatment with IV ceftriaxone  followed by PO cefadroxil .  Completed a course of antibiotic Urine culture from 08/31/2023 showed Klebsiella pneumoniae and Proteus mirabilis. Repeat UA with similar findings, no leukocytosis so holding of 2 more antibiotics for now.  AKI, probable CKD stage IIIa: Creatinine is stable.  Elevated CK: Does not meet criteria for rhabdomyolysis.  CK improved from 659-547.  Hypokalemia: Resolved with potassium of 3.8 today  Hypomagnesemia: Improved - Continue to monitor periodically  Hypophosphatemia: Repleted with potassium phosphate   Diarrhea: Imodium  as needed.  No indication for C. difficile testing at this time.  GI pathogen panel negative   Comorbidities include COPD, Parkinson's disease on carbidopa -levodopa , anxiety, depression, type II DM, GERD, moderate malnutrition, hypoalbuminemia   General Weakness: PT and OT recommended discharge to SNF.  Follow-up with TOC to assist with  disposition.  Awaiting placement at SNF-medically stable for the past many days   Diet Order             DIET DYS 3 Fluid consistency: Thin  Diet effective now                  Consultants: Information systems manager  Procedures: None   Medications:    (feeding supplement) PROSource Plus  30 mL Oral BID BM   vitamin C   250 mg Oral BID   busPIRone   7.5 mg Oral BID   calcium  carbonate  500 mg of elemental calcium  Oral BID WC   carbidopa -levodopa   1 tablet Oral QHS   carbidopa -levodopa   1.5 tablet Oral TID   Chlorhexidine  Gluconate Cloth  6 each Topical Daily   colchicine   0.6 mg Oral Daily   cyclobenzaprine   10 mg Oral QHS   dicyclomine   10 mg Oral TID AC & HS   DULoxetine   60 mg Oral Daily   enoxaparin  (LOVENOX ) injection  40 mg Subcutaneous Q24H   feeding supplement  237 mL Oral TID BM   fluticasone   2 spray Each Nare Daily   gabapentin   400 mg Oral TID   ipratropium  2 spray Each Nare QID   midodrine   5 mg Oral TID WC   mirtazapine   15 mg Oral QHS   montelukast   10 mg Oral QHS   multivitamin with minerals  1 tablet Oral Daily   oxyCODONE   15 mg Oral Q12H   pantoprazole   40 mg Oral Daily   QUEtiapine   25 mg Oral QHS   sodium chloride   1 spray Each Nare Q4H   theophylline   400 mg Oral Daily   umeclidinium bromide   1 puff Inhalation Daily   Vitamin D  (Ergocalciferol )  50,000 Units Oral Q7 days   Continuous Infusions:  lactated ringers  100 mL/hr at 09/15/23 0915   promethazine  (PHENERGAN ) injection (IM or IVPB) 12.5 mg (09/12/23 1622)     Anti-infectives (From admission, onward)    Start     Dose/Rate Route Frequency Ordered Stop   09/03/23 1100  cefadroxil  (DURICEF) capsule 500 mg        500 mg Oral 2 times daily 09/03/23 1007 09/04/23 2144   08/31/23 2200  cefTRIAXone  (ROCEPHIN ) 1 g in sodium chloride  0.9 % 100 mL IVPB  Status:  Discontinued        1 g 200 mL/hr over 30 Minutes Intravenous Every 24 hours 08/31/23 2041 09/03/23 1007               Family Communication/Anticipated D/C date and plan/Code Status   DVT prophylaxis: Place TED hose Start: 09/02/23 1524 enoxaparin  (LOVENOX ) injection 40 mg Start: 08/30/23 2200     Code Status: Full Code  Family Communication: No family at bedside  Disposition Plan: Plan discharge to SNF   Status is: Inpatient Remains inpatient appropriate because: Awaiting placement to SNF   Subjective:  Patient has complaining of still having some mucousy diarrhea although seems improving and abdominal cramps.  Objective:    Vitals:   09/14/23 1937 09/15/23 0335 09/15/23 0350 09/15/23 0903  BP:   118/63 137/62  Pulse:   90 82  Resp:   17 16  Temp: 98.3 F (36.8 C)  98.3 F (36.8 C) (!) 97.5 F (36.4 C)  TempSrc:   Oral Oral  SpO2: 100%   100%  Weight:  67.5 kg    Height:       Orthostatic VS for the past 24 hrs:  BP- Lying Pulse- Lying BP- Sitting Pulse- Sitting BP- Standing at 0 minutes Pulse- Standing at 0 minutes  09/15/23 0903 137/62 82 106/67 95 (!) 69/50 99  09/15/23 0800 137/62 82 106/67 95 (!) 69/50 99  09/15/23 0359 -- -- -- -- 90/56 112  09/15/23 0355 -- -- 110/61 108 -- --  09/15/23 0350 118/63 90 -- -- -- --  09/14/23 2000 128/56 97 103/60 102 (!) 66/55 96  09/14/23 1915 128/56 97 103/60 102 (!) 66/55 96      Intake/Output Summary (Last 24 hours) at 09/15/2023 1331 Last data filed at 09/15/2023 1127 Gross per 24 hour  Intake 240 ml  Output 800 ml  Net -560 ml    Filed Weights   09/12/23 0500 09/13/23 0500 09/15/23 0335  Weight: 66.2 kg 67.5 kg 67.5 kg    Exam: General.  Frail lady, in no acute distress. Pulmonary.  Lungs clear bilaterally, normal respiratory effort. CV.  Regular rate and rhythm, no JVD, rub or murmur. Abdomen.  Soft, nontender, nondistended, BS positive. CNS.  Alert and oriented .  No focal neurologic deficit. Extremities.  No edema, no cyanosis, pulses intact and symmetrical.  Data Reviewed:   I have personally  reviewed following labs and imaging studies:  Labs: Labs show the following:   Basic Metabolic Panel: Recent Labs  Lab 09/12/23 0347 09/13/23 0453 09/14/23 0529 09/15/23 0618 09/15/23 1242  NA 139 141 140 139 143  K 2.4* 2.9* 4.0 3.1* 3.8  CL 107 108 112* 111 112*  CO2 24 25 21* 21* 23  GLUCOSE 113* 109* 109* 124* 133*  BUN 9 8 11 14  17  CREATININE 0.99 1.10* 0.93 0.85 1.05*  CALCIUM  7.6* 7.7* 7.7* 7.6* 7.9*  MG 1.8  --   --   --   --   PHOS 3.5  --   --   --   --    GFR Estimated Creatinine Clearance: 56 mL/min (A) (by C-G formula based on SCr of 1.05 mg/dL (H)). Liver Function Tests: No results for input(s): AST, ALT, ALKPHOS, BILITOT, PROT, ALBUMIN in the last 168 hours.  No results for input(s): LIPASE, AMYLASE in the last 168 hours. No results for input(s): AMMONIA in the last 168 hours. Coagulation profile No results for input(s): INR, PROTIME in the last 168 hours.  CBC: Recent Labs  Lab 09/13/23 1149 09/15/23 1242  WBC 9.9 9.0  HGB 10.6* 9.9*  HCT 32.3* 30.5*  MCV 98.5 101.0*  PLT 404* 302    Cardiac Enzymes: No results for input(s): CKTOTAL, CKMB, CKMBINDEX, TROPONINI in the last 168 hours.  BNP (last 3 results) No results for input(s): PROBNP in the last 8760 hours. CBG: No results for input(s): GLUCAP in the last 168 hours. D-Dimer: No results for input(s): DDIMER in the last 72 hours. Hgb A1c: No results for input(s): HGBA1C in the last 72 hours. Lipid Profile: No results for input(s): CHOL, HDL, LDLCALC, TRIG, CHOLHDL, LDLDIRECT in the last 72 hours. Thyroid  function studies: No results for input(s): TSH, T4TOTAL, T3FREE, THYROIDAB in the last 72 hours.  Invalid input(s): FREET3 Anemia work up: Recent Labs    09/13/23 0453  VITAMINB12 1,105*  FOLATE 15.1   Sepsis Labs: Recent Labs  Lab 09/13/23 1149 09/15/23 1242  WBC 9.9 9.0     Microbiology Recent Results (from  the past 240 hours)  Gastrointestinal Panel by PCR , Stool     Status: None   Collection Time: 09/13/23  9:56 AM   Specimen: Stool  Result Value Ref Range Status   Campylobacter species NOT DETECTED NOT DETECTED Final   Plesimonas shigelloides NOT DETECTED NOT DETECTED Final   Salmonella species NOT DETECTED NOT DETECTED Final   Yersinia enterocolitica NOT DETECTED NOT DETECTED Final   Vibrio species NOT DETECTED NOT DETECTED Final   Vibrio cholerae NOT DETECTED NOT DETECTED Final   Enteroaggregative E coli (EAEC) NOT DETECTED NOT DETECTED Final   Enteropathogenic E coli (EPEC) NOT DETECTED NOT DETECTED Final   Enterotoxigenic E coli (ETEC) NOT DETECTED NOT DETECTED Final   Shiga like toxin producing E coli (STEC) NOT DETECTED NOT DETECTED Final   Shigella/Enteroinvasive E coli (EIEC) NOT DETECTED NOT DETECTED Final   Cryptosporidium NOT DETECTED NOT DETECTED Final   Cyclospora cayetanensis NOT DETECTED NOT DETECTED Final   Entamoeba histolytica NOT DETECTED NOT DETECTED Final   Giardia lamblia NOT DETECTED NOT DETECTED Final   Adenovirus F40/41 NOT DETECTED NOT DETECTED Final   Astrovirus NOT DETECTED NOT DETECTED Final   Norovirus GI/GII NOT DETECTED NOT DETECTED Final   Rotavirus A NOT DETECTED NOT DETECTED Final   Sapovirus (I, II, IV, and V) NOT DETECTED NOT DETECTED Final    Comment: Performed at [facility], [address] [place], [place] [address]     Procedures and diagnostic studies:  No results found.   LOS: 17 days   This record has been created using Conservation officer, historic buildings. Errors have been sought and corrected,but may not always be located. Such creation errors do not reflect on the standard of care.   Amaryllis Dare  [employer] and [email or web address]. If  7PM-7AM, please contact night-coverage at [email or web address]  09/15/2023, 1:31 PM

## 2023-09-15 NOTE — Progress Notes (Signed)
 RN rounding on PT assisted PT to [facility]. Pt is voiding small amount of urine times 3 times so far. Pt is still have clear mucous Bms. Personal items within reach. Fall mat on floor. Call bell within reach, and bed alarm is activated. Yellow bractlet  and non slip socks on. Pt education related to safety and fall prevention completed.

## 2023-09-15 NOTE — Plan of Care (Signed)

## 2023-09-16 DIAGNOSIS — E785 Hyperlipidemia, unspecified: Secondary | ICD-10-CM | POA: Diagnosis not present

## 2023-09-16 DIAGNOSIS — R55 Syncope and collapse: Secondary | ICD-10-CM | POA: Diagnosis not present

## 2023-09-16 DIAGNOSIS — R748 Abnormal levels of other serum enzymes: Secondary | ICD-10-CM | POA: Diagnosis not present

## 2023-09-16 DIAGNOSIS — N179 Acute kidney failure, unspecified: Secondary | ICD-10-CM | POA: Diagnosis not present

## 2023-09-16 LAB — GLUCOSE, CAPILLARY: Glucose-Capillary: 117 mg/dL — ABNORMAL HIGH (ref 70–99)

## 2023-09-16 NOTE — Progress Notes (Signed)
 Progress Note    Tricia [name]  FMW:[id number] DOB: November 06, 1960  DOA: 08/28/2023 PCP: Sowles, Krichna, MD      Brief Narrative:    Medical records reviewed and are as summarized below:  Tricia [name] is a 63 y.o. female with chronic pain, COPD, asthma, osteoarthritis, anxiety, depression, type 2 diabetes, diastolic CHF, GERD, hypertension, dyslipidemia, who presents to the ED with syncopal episode.  Initial vitals in the ED are 125/100, creatinine elevation of 2.55, CK  659.  Noncon head CT without acute intracranial abnormalities.  Patient received 2 L NS and was admitted.  Gradually her mental status has improved but she remains intermittently altered.  We have significant concerns about her ability to take care of herself at home.   7/23: Remained hemodynamically stable.  No TOC note for placement-sent another message.   7/24: Remained hemodynamically stable, patient does not have capacity to make decision per psychiatry-she was evaluated on 7/15 for capacity.  She wants to go home but unsafe environment with frequent falls and mixing up of medications.  Another message sent to [facility] as she is medically stable for the past many days with unnecessary length of stay now.  7/25: Hemodynamically stable but continued to have diarrhea-GI pathogen panel was negative.  No leukocytosis, mild hypokalemia which is being repleted.  Borderline magnesium  at 1.8.  B12 and folate levels were normal, vitamin D  low-starting on supplement. TOC still need to work on placement.  Patient remained medically stable for discharge.  7/26: Hemodynamically stable with mildly elevated blood pressure so decreasing the dose of midodrine  to 5 mg 3 times daily.  Discontinuing pyridostigmine  as it might be the cause of diarrhea and abdominal cramps.  Patient would like to discontinue Foley catheter with the hope that she can void now, ordered removal of Foley catheter to give her a voiding  trial.  Overnight fall while getting out of the bed, patient felt in her recliner-no reported injuries or loss of consciousness.  7/27: Continued to have crampy abdominal pain, significant urinary retention with postvoid volume of about 700 cc-one-time In-N-Out catheter was ordered, might need to replace Foley if she continued to have significant postvoid volume.  Patient wants to avoid catheter placement if possible. UA was sent as nursing concern of some purulence during In-N-Out catheter-UA pretty much the same as before with no leukocytosis so we will hold off antibiotics.  7/28: Hemodynamically stable with improving abdominal cramp and diarrhea.  Continue to have urinary retention so Foley catheter was replaced overnight. Still awaiting placement-apparently she has to go through guardianship protocol.   Assessment/Plan:   Principal Problem:   Acute kidney injury superimposed on chronic kidney disease (HCC) Active Problems:   Syncope, vasovagal   Elevated CK   Dyslipidemia   GERD without esophagitis   Parkinson disease (HCC)   Anxiety and depression   Chronic obstructive pulmonary disease (COPD) (HCC)   Type 2 diabetes mellitus with stage 3 chronic kidney disease (HCC)   Malnutrition of moderate degree   Nutrition Problem: Moderate Malnutrition Etiology: chronic illness (COPD, Parkinson's disease)  Signs/Symptoms: mild fat depletion, moderate fat depletion, mild muscle depletion, moderate muscle depletion   Body mass index is 22.49 kg/m.   S/p syncope: There is probably from orthostatic hypotension. S/p treatment with IV fluids. 2D echo showed preserved EF, grade 1 diastolic dysfunction.   Recurrent orthostatic hypotension: This may be chronic.  Risk factors for this include polypharmacy, diabetes mellitus and Parkinson's disease. Decreasing the  dose of midodrine  to 5 mg 3 times daily as blood pressure started trending up - Discontinuing pyridostigmine  as it might be  the cause of her diarrhea and abdominal cramps - Monitor orthostatic vitals  Polypharmacy, chronic pain, opioid dependence: - Patient is on multiple psychiatric medications, duplicate antihistamine therapy, as well as chronic opioid therapy - Home meds include: BuSpar , Cymbalta , Flexeril , Xyzal , gabapentin , morphine , MS Contin , Seroquel , hydroxyzine . - Hydroxyzine  and immediate release morphine  have already been discontinued.    - Continue OxyContin  for now.   Outpatient follow-up with pain clinic for de-escalation of opioids strongly recommended.   Urinary retention: Foley catheter placed after multiple In-N-Out ureteral catheterization.  Failed voiding trial on 09/01/2023.  She was evaluated by the urologist on 09/02/2023.  Foley catheter has been replaced.  It was recommended that patient be discharged on Foley catheter.  Outpatient follow-up with urologist for voiding trial. - Failed another voiding trial on 7/27 so Foley was replaced - Need outpatient follow-up with urology  Acute UTI: S/p treatment with IV ceftriaxone  followed by PO cefadroxil .  Completed a course of antibiotic Urine culture from 08/31/2023 showed Klebsiella pneumoniae and Proteus mirabilis. Repeat UA with similar findings, no leukocytosis so holding of 2 more antibiotics for now.  AKI, probable CKD stage IIIa: Creatinine is stable.  Elevated CK: Does not meet criteria for rhabdomyolysis.  CK improved from 659-547.  Hypokalemia: Resolved with potassium of 3.8 today  Hypomagnesemia: Improved - Continue to monitor periodically  Hypophosphatemia: Repleted with potassium phosphate   Diarrhea: Imodium  as needed.  No indication for C. difficile testing at this time.  GI pathogen panel negative   Comorbidities include COPD, Parkinson's disease on carbidopa -levodopa , anxiety, depression, type II DM, GERD, moderate malnutrition, hypoalbuminemia   General Weakness: PT and OT recommended discharge to SNF.  Follow-up with  TOC to assist with disposition.  Awaiting placement at SNF-medically stable for the past many days   Diet Order             DIET DYS 3 Fluid consistency: Thin  Diet effective now                  Consultants: Information systems manager  Procedures: None   Medications:    (feeding supplement) PROSource Plus  30 mL Oral BID BM   vitamin C   250 mg Oral BID   busPIRone   7.5 mg Oral BID   calcium  carbonate  500 mg of elemental calcium  Oral BID WC   carbidopa -levodopa   1 tablet Oral QHS   carbidopa -levodopa   1.5 tablet Oral TID   Chlorhexidine  Gluconate Cloth  6 each Topical Daily   colchicine   0.6 mg Oral Daily   cyclobenzaprine   10 mg Oral QHS   dicyclomine   10 mg Oral TID AC & HS   DULoxetine   60 mg Oral Daily   enoxaparin  (LOVENOX ) injection  40 mg Subcutaneous Q24H   feeding supplement  237 mL Oral TID BM   fluticasone   2 spray Each Nare Daily   gabapentin   400 mg Oral TID   ipratropium  2 spray Each Nare QID   midodrine   5 mg Oral TID WC   mirtazapine   15 mg Oral QHS   montelukast   10 mg Oral QHS   multivitamin with minerals  1 tablet Oral Daily   oxyCODONE   15 mg Oral Q12H   pantoprazole   40 mg Oral Daily   QUEtiapine   25 mg Oral QHS   sodium chloride   1 spray Each Nare Q4H  theophylline   400 mg Oral Daily   umeclidinium bromide   1 puff Inhalation Daily   Vitamin D  (Ergocalciferol )  50,000 Units Oral Q7 days   Continuous Infusions:  promethazine  (PHENERGAN ) injection (IM or IVPB) 12.5 mg (09/12/23 1622)     Anti-infectives (From admission, onward)    Start     Dose/Rate Route Frequency Ordered Stop   09/03/23 1100  cefadroxil  (DURICEF) capsule 500 mg        500 mg Oral 2 times daily 09/03/23 1007 09/04/23 2144   08/31/23 2200  cefTRIAXone  (ROCEPHIN ) 1 g in sodium chloride  0.9 % 100 mL IVPB  Status:  Discontinued        1 g 200 mL/hr over 30 Minutes Intravenous Every 24 hours 08/31/23 2041 09/03/23 1007       Family Communication/Anticipated D/C  date and plan/Code Status   DVT prophylaxis: Place TED hose Start: 09/02/23 1524 enoxaparin  (LOVENOX ) injection 40 mg Start: 08/30/23 2200     Code Status: Full Code  Family Communication: No family at bedside  Disposition Plan: Plan discharge to SNF   Status is: Inpatient Remains inpatient appropriate because: Awaiting placement to SNF   Subjective:  Patient was seen and examined today.  Again asking about going home.  Diarrhea and abdominal cramps improving.  Objective:    Vitals:   09/16/23 0356 09/16/23 0457 09/16/23 0500 09/16/23 0903  BP: 116/69   120/60  Pulse: 96   82  Resp: 16   16  Temp: 97.8 F (36.6 C)   (!) 97.5 F (36.4 C)  TempSrc:    Oral  SpO2: 99%     Weight:  67 kg 67.1 kg   Height:       Orthostatic VS for the past 24 hrs:  BP- Lying Pulse- Lying BP- Sitting Pulse- Sitting BP- Standing at 0 minutes Pulse- Standing at 0 minutes  09/16/23 1012 -- -- (P) 130/59 (P) 98 (P) 91/67 (P) 120  09/15/23 2000 116/57 89 111/65 100 (!) 71/51 112      Intake/Output Summary (Last 24 hours) at 09/16/2023 1427 Last data filed at 09/16/2023 1300 Gross per 24 hour  Intake 2428.99 ml  Output 1675 ml  Net 753.99 ml    Filed Weights   09/15/23 0335 09/16/23 0457 09/16/23 0500  Weight: 67.5 kg 67 kg 67.1 kg    Exam: General.  Frail lady, in no acute distress. Pulmonary.  Lungs clear bilaterally, normal respiratory effort. CV.  Regular rate and rhythm, no JVD, rub or murmur. Abdomen.  Soft, nontender, nondistended, BS positive. CNS.  Alert and oriented .  No focal neurologic deficit. Extremities.  No edema, no cyanosis, pulses intact and symmetrical.  Data Reviewed:   I have personally reviewed following labs and imaging studies:  Labs: Labs show the following:   Basic Metabolic Panel: Recent Labs  Lab 09/12/23 0347 09/13/23 0453 09/14/23 0529 09/15/23 0618 09/15/23 1242  NA 139 141 140 139 143  K 2.4* 2.9* 4.0 3.1* 3.8  CL 107 108 112* 111  112*  CO2 24 25 21* 21* 23  GLUCOSE 113* 109* 109* 124* 133*  BUN 9 8 11 14 17   CREATININE 0.99 1.10* 0.93 0.85 1.05*  CALCIUM  7.6* 7.7* 7.7* 7.6* 7.9*  MG 1.8  --   --   --   --   PHOS 3.5  --   --   --   --    GFR Estimated Creatinine Clearance: 56 mL/min (A) (by C-G formula based on  SCr of 1.05 mg/dL (H)). Liver Function Tests: No results for input(s): AST, ALT, ALKPHOS, BILITOT, PROT, ALBUMIN in the last 168 hours.  No results for input(s): LIPASE, AMYLASE in the last 168 hours. No results for input(s): AMMONIA in the last 168 hours. Coagulation profile No results for input(s): INR, PROTIME in the last 168 hours.  CBC: Recent Labs  Lab 09/13/23 1149 09/15/23 1242  WBC 9.9 9.0  HGB 10.6* 9.9*  HCT 32.3* 30.5*  MCV 98.5 101.0*  PLT 404* 302    Cardiac Enzymes: No results for input(s): CKTOTAL, CKMB, CKMBINDEX, TROPONINI in the last 168 hours.  BNP (last 3 results) No results for input(s): PROBNP in the last 8760 hours. CBG: Recent Labs  Lab 09/16/23 1143  GLUCAP 117*   D-Dimer: No results for input(s): DDIMER in the last 72 hours. Hgb A1c: No results for input(s): HGBA1C in the last 72 hours. Lipid Profile: No results for input(s): CHOL, HDL, LDLCALC, TRIG, CHOLHDL, LDLDIRECT in the last 72 hours. Thyroid  function studies: No results for input(s): TSH, T4TOTAL, T3FREE, THYROIDAB in the last 72 hours.  Invalid input(s): FREET3 Anemia work up: No results for input(s): VITAMINB12, FOLATE, FERRITIN, TIBC, IRON, RETICCTPCT in the last 72 hours.  Sepsis Labs: Recent Labs  Lab 09/13/23 1149 09/15/23 1242  WBC 9.9 9.0     Microbiology Recent Results (from the past 240 hours)  Gastrointestinal Panel by PCR , Stool     Status: None   Collection Time: 09/13/23  9:56 AM   Specimen: Stool  Result Value Ref Range Status   Campylobacter species NOT DETECTED NOT DETECTED Final   Plesimonas  shigelloides NOT DETECTED NOT DETECTED Final   Salmonella species NOT DETECTED NOT DETECTED Final   Yersinia enterocolitica NOT DETECTED NOT DETECTED Final   Vibrio species NOT DETECTED NOT DETECTED Final   Vibrio cholerae NOT DETECTED NOT DETECTED Final   Enteroaggregative E coli (EAEC) NOT DETECTED NOT DETECTED Final   Enteropathogenic E coli (EPEC) NOT DETECTED NOT DETECTED Final   Enterotoxigenic E coli (ETEC) NOT DETECTED NOT DETECTED Final   Shiga like toxin producing E coli (STEC) NOT DETECTED NOT DETECTED Final   Shigella/Enteroinvasive E coli (EIEC) NOT DETECTED NOT DETECTED Final   Cryptosporidium NOT DETECTED NOT DETECTED Final   Cyclospora cayetanensis NOT DETECTED NOT DETECTED Final   Entamoeba histolytica NOT DETECTED NOT DETECTED Final   Giardia lamblia NOT DETECTED NOT DETECTED Final   Adenovirus F40/41 NOT DETECTED NOT DETECTED Final   Astrovirus NOT DETECTED NOT DETECTED Final   Norovirus GI/GII NOT DETECTED NOT DETECTED Final   Rotavirus A NOT DETECTED NOT DETECTED Final   Sapovirus (I, II, IV, and V) NOT DETECTED NOT DETECTED Final    Comment: Performed at [facility], [address] [place], [place] [address]     Procedures and diagnostic studies:  No results found.   LOS: 18 days   This record has been created using Conservation officer, historic buildings. Errors have been sought and corrected,but may not always be located. Such creation errors do not reflect on the standard of care.   Amaryllis Dare  [employer] and [email or web address]. If 7PM-7AM, please contact night-coverage at [email or web address]  09/16/2023, 2:27 PM

## 2023-09-16 NOTE — Plan of Care (Signed)

## 2023-09-16 NOTE — NC FL2 (Signed)
 [place]  MEDICAID FL2 LEVEL OF CARE FORM     IDENTIFICATION  Patient Name: Tricia [name] Birthdate: 07-19-1960 Sex: female Admission Date (Current Location): 08/28/2023  [facility] and [place] Number:  Chiropodist and Address:  [facility], [address] [place], [place] [address]      Provider Number: [id number]  Attending Physician Name and Address:  Caleen Qualia, MD  Relative Name and Phone Number:  Norlene Braver, granddaughter, phone:[phone]    Current Level of Care: Hospital Recommended Level of Care: Skilled Nursing Facility Prior Approval Number:    Date Approved/Denied:   PASRR Number:    Discharge Plan: SNF    Current Diagnoses: Patient Active Problem List   Diagnosis Date Noted   Malnutrition of moderate degree 09/02/2023   Syncope, vasovagal 08/29/2023   Parkinson disease (HCC) 08/29/2023   Anxiety and depression 08/29/2023   Chronic obstructive pulmonary disease (COPD) (HCC) 08/29/2023   GERD without esophagitis 08/29/2023   Elevated CK 08/29/2023   Type 2 diabetes mellitus with stage 3 chronic kidney disease (HCC) 08/29/2023   Acute kidney injury superimposed on chronic kidney disease (HCC) 08/28/2023   Sacral wound 02/21/2022   Pressure injury of sacral region, stage 1 02/21/2022   Nausea 09/08/2021   Hypoalbuminemia due to protein-calorie malnutrition (HCC) 09/08/2021   Lumbar herniated disc 07/11/2021   Calculus of gallbladder without cholecystitis without obstruction 07/11/2021   Senile purpura (HCC) 06/05/2021   Atherosclerosis of aorta (HCC) 06/05/2021   Parkinson's disease (HCC) 06/05/2021   Chronic kidney disease (CKD) stage G3a/A2, moderately decreased glomerular filtration rate (GFR) between 45-59 mL/min/1.73 square meter and albuminuria creatinine ratio between 30-299 mg/g (HCC) 06/05/2021   Esophageal dysphagia    Gastric erythema    Columnar-lined esophagus    Moderate malnutrition (HCC)  09/26/2020   Centrilobular emphysema (HCC) 03/30/2020   History of prolonged Q-T interval on ECG 11/18/2019   GAD (generalized anxiety disorder) 10/13/2019   MDD (major depressive disorder), recurrent episode, moderate (HCC) 10/13/2019   At risk for long QT syndrome 10/13/2019   Nonrheumatic mitral valve regurgitation 05/04/2019   Benign neoplasm of descending colon    Polyp of sigmoid colon    Polyneuropathy 10/21/2014   Chronic venous insufficiency 10/05/2014   Bilateral leg edema 08/16/2014   Major depression in partial remission (HCC) 08/16/2014   Acid reflux 08/16/2014   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   Asthma, moderate persistent 08/16/2014   Carpal tunnel syndrome 08/16/2014   Cervical pain 08/16/2014   CAFL (chronic airflow limitation) (HCC) 08/16/2014   Type 2 diabetes mellitus with peripheral neuropathy (HCC) 08/16/2014   Diabetes mellitus type 2, insulin  dependent (HCC) 08/16/2014   Dyslipidemia 08/16/2014   Tobacco use disorder 08/16/2014   Essential (primary) hypertension 08/16/2014   Benign neoplasm of stomach 08/16/2014   Gout 08/16/2014   Mixed hyperlipidemia 08/16/2014   Low back pain 08/16/2014   Lumbar radiculopathy 08/16/2014   Headache, migraine 08/16/2014   Arthralgia of multiple joints 08/16/2014   Vitamin D  deficiency 08/16/2014   Primary osteoarthritis of both knees 06/22/2014   Benign essential tremor 10/02/2013   Cervical dystonia 10/02/2013   Chronic diastolic heart failure (HCC) 11/16/2012   Chronic pain 11/13/2012    Orientation RESPIRATION BLADDER Height & Weight     Self, Place  Normal Indwelling catheter Weight: 67.1 kg Height:  5' 8 (172.7 cm)  BEHAVIORAL SYMPTOMS/MOOD NEUROLOGICAL BOWEL NUTRITION STATUS      Continent  (Please see discharge summary)  AMBULATORY  STATUS COMMUNICATION OF NEEDS Skin   Extensive Assist Verbally Normal                       Personal Care Assistance Level of Assistance  Bathing, Feeding,  Dressing Bathing Assistance: Limited assistance Feeding assistance: Limited assistance Dressing Assistance: Limited assistance     Functional Limitations Info  Sight, Hearing Sight Info: Impaired Hearing Info: Impaired      SPECIAL CARE FACTORS FREQUENCY  PT (By licensed PT), OT (By licensed OT)     PT Frequency: 5 x per week OT Frequency: 5 x per week            Contractures      Additional Factors Info  Code Status, Allergies Code Status Info: Full Code Allergies Info: Augmentin (amoxicillin-pot Clavulanate)  No severity specified - Diarrhea  Penicillins  No severity specified - Itching           Current Medications (09/16/2023):  This is the current hospital active medication list Current Facility-Administered Medications  Medication Dose Route Frequency Provider Last Rate Last Admin   (feeding supplement) PROSource Plus liquid 30 mL  30 mL Oral BID BM Jens Durand, MD   30 mL at 09/16/23 1512   acetaminophen  (TYLENOL ) tablet 650 mg  650 mg Oral Q6H PRN Mansy, Jan A, MD   650 mg at 09/16/23 9359   Or   acetaminophen  (TYLENOL ) suppository 650 mg  650 mg Rectal Q6H PRN Mansy, Jan A, MD       alum & mag hydroxide-simeth (MAALOX/MYLANTA) 200-200-20 MG/5ML suspension 30 mL  30 mL Oral Q4H PRN Amin, Sumayya, MD   30 mL at 09/12/23 1352   ascorbic acid  (VITAMIN C ) tablet 250 mg  250 mg Oral BID Amin, Sumayya, MD   250 mg at 09/16/23 0957   busPIRone  (BUSPAR ) tablet 7.5 mg  7.5 mg Oral BID Mansy, Jan A, MD   7.5 mg at 09/16/23 1006   calcium  carbonate (OS-CAL - dosed in mg of elemental calcium ) tablet 1,250 mg  500 mg of elemental calcium  Oral BID WC Amin, Sumayya, MD   1,250 mg at 09/16/23 1006   carbidopa -levodopa  (SINEMET  CR) 50-200 MG per tablet controlled release 1 tablet  1 tablet Oral QHS Mansy, Jan A, MD   1 tablet at 09/15/23 2224   carbidopa -levodopa  (SINEMET  IR) 25-100 MG per tablet immediate release 1.5 tablet  1.5 tablet Oral TID Mansy, Jan A, MD   1.5 tablet at  09/16/23 1509   Chlorhexidine  Gluconate Cloth 2 % PADS 6 each  6 each Topical Daily Dezii, Alexandra, DO   6 each at 09/16/23 1514   colchicine  tablet 0.6 mg  0.6 mg Oral Daily Mansy, Jan A, MD   0.6 mg at 09/16/23 1008   cyclobenzaprine  (FLEXERIL ) tablet 10 mg  10 mg Oral QHS Mansy, Jan A, MD   10 mg at 09/15/23 2152   dicyclomine  (BENTYL ) capsule 10 mg  10 mg Oral TID AC & HS Amin, Sumayya, MD   10 mg at 09/16/23 1234   DULoxetine  (CYMBALTA ) DR capsule 60 mg  60 mg Oral Daily Mansy, Jan A, MD   60 mg at 09/16/23 0954   enoxaparin  (LOVENOX ) injection 40 mg  40 mg Subcutaneous Q24H Merrill, Kristin A, RPH   40 mg at 09/15/23 2223   feeding supplement (ENSURE PLUS HIGH PROTEIN) liquid 237 mL  237 mL Oral TID BM Dezii, Alexandra, DO   237 mL at 09/15/23 2155  fluticasone  (FLONASE ) 50 MCG/ACT nasal spray 2 spray  2 spray Each Nare Daily Mansy, Jan A, MD   2 spray at 09/12/23 1030   gabapentin  (NEURONTIN ) capsule 400 mg  400 mg Oral TID Dezii, Alexandra, DO   400 mg at 09/16/23 0959   guaiFENesin  (ROBITUSSIN) 100 MG/5ML liquid 5 mL  5 mL Oral Q4H PRN Amin, Sumayya, MD   5 mL at 09/12/23 1353   haloperidol  lactate (HALDOL ) injection 1-2 mg  1-2 mg Intramuscular Q6H PRN Mansy, Jan A, MD   2 mg at 09/12/23 1023   ipratropium (ATROVENT ) 0.06 % nasal spray 2 spray  2 spray Each Nare QID Mansy, Jan A, MD   2 spray at 09/12/23 1031   ipratropium-albuterol  (DUONEB) 0.5-2.5 (3) MG/3ML nebulizer solution 3 mL  3 mL Inhalation Q6H PRN Mansy, Jan A, MD   3 mL at 09/07/23 1236   lidocaine  (LIDODERM ) 5 % 1 patch  1 patch Transdermal Q12H PRN Mansy, Jan A, MD   1 patch at 09/15/23 9077   loperamide  (IMODIUM ) capsule 2 mg  2 mg Oral PRN Mansy, Jan A, MD   2 mg at 09/13/23 0424   midodrine  (PROAMATINE ) tablet 5 mg  5 mg Oral TID WC Amin, Sumayya, MD   5 mg at 09/16/23 1234   mirtazapine  (REMERON  SOL-TAB) disintegrating tablet 15 mg  15 mg Oral QHS Amin, Sumayya, MD   15 mg at 09/15/23 2224   montelukast  (SINGULAIR )  tablet 10 mg  10 mg Oral QHS Mansy, Jan A, MD   10 mg at 09/15/23 2151   multivitamin with minerals tablet 1 tablet  1 tablet Oral Daily Dezii, Alexandra, DO   1 tablet at 09/16/23 0956   ondansetron  (ZOFRAN ) tablet 4 mg  4 mg Oral Q6H PRN Mansy, Jan A, MD   4 mg at 09/16/23 9359   Or   ondansetron  (ZOFRAN ) injection 4 mg  4 mg Intravenous Q6H PRN Mansy, Jan A, MD   4 mg at 09/16/23 1237   oxyCODONE  (OXYCONTIN ) 12 hr tablet 15 mg  15 mg Oral Q12H Mansy, Jan A, MD   15 mg at 09/16/23 1000   pantoprazole  (PROTONIX ) EC tablet 40 mg  40 mg Oral Daily Mansy, Jan A, MD   40 mg at 09/16/23 0954   polyethylene glycol (MIRALAX  / GLYCOLAX ) packet 17 g  17 g Oral Daily PRN Jens Durand, MD   17 g at 09/08/23 1745   promethazine  (PHENERGAN ) 12.5 mg in sodium chloride  0.9 % 50 mL IVPB  12.5 mg Intravenous Q6H PRN Mansy, Jan A, MD 150 mL/hr at 09/12/23 1622 12.5 mg at 09/12/23 1622   QUEtiapine  (SEROQUEL ) tablet 25 mg  25 mg Oral QHS Mansy, Jan A, MD   25 mg at 09/15/23 2151   sodium chloride  (OCEAN) 0.65 % nasal spray 1 spray  1 spray Each Nare Q4H Dezii, Alexandra, DO   1 spray at 09/12/23 1214   SUMAtriptan  (IMITREX ) tablet 100 mg  100 mg Oral Q2H PRN Dail Rankin RAMAN, RPH       theophylline  (UNIPHYL) 400 MG 24 hr tablet 400 mg  400 mg Oral Daily Mansy, Jan A, MD   400 mg at 09/16/23 1006   traZODone  (DESYREL ) tablet 25 mg  25 mg Oral QHS PRN Mansy, Jan A, MD   25 mg at 09/14/23 2055   umeclidinium bromide  (INCRUSE ELLIPTA ) 62.5 MCG/ACT 1 puff  1 puff Inhalation Daily Belue, Rankin RAMAN, RPH   1 puff at  09/13/23 0935   Vitamin D  (Ergocalciferol ) (DRISDOL ) 1.25 MG (50000 UNIT) capsule 50,000 Units  50,000 Units Oral Q7 days Amin, Sumayya, MD   50,000 Units at 09/13/23 1547     Discharge Medications: Please see discharge summary for a list of discharge medications.  Relevant Imaging Results:  Relevant Lab Results:   Additional Information SSN:[id number]  [name], RN

## 2023-09-16 NOTE — TOC Progression Note (Signed)
 Transition of Care [facility]) - Progression Note    Patient Details  Name: Tricia [name] MRN: [id number] Date of Birth: 1960-05-12  Transition of Care [facility]) CM/SW Contact  [name], RN 09/16/2023, 3:41 PM  Clinical Narrative:     The above named patient is recommended to go to Short Term Rehab for strengthening and gait training for balance.  It is expected that the Short Term Rehab stay will be less than 30 days.  The patient is expected to return home after Rehab.   Expected Discharge Plan: Skilled Nursing Facility Barriers to Discharge: Continued Medical Work up  Expected Discharge Plan and Services    SNF   Living arrangements for the past 2 months: Single Family Home                  Social Drivers of Health (SDOH) Interventions SDOH Screenings   Food Insecurity: No Food Insecurity (08/29/2023)  Housing: Low Risk  (08/29/2023)  Transportation Needs: No Transportation Needs (08/29/2023)  Utilities: Not At Risk (08/29/2023)  Alcohol Screen: Low Risk  (03/07/2023)  Depression (PHQ2-9): High Risk (05/01/2023)  Financial Resource Strain: Patient Declined (06/18/2023)   Received from [facility]  Physical Activity: Sufficiently Active (03/07/2023)  Social Connections: Socially Isolated (08/29/2023)  Stress: No Stress Concern Present (03/07/2023)  Tobacco Use: High Risk (08/28/2023)  Health Literacy: Adequate Health Literacy (03/07/2023)    Readmission Risk Interventions     No data to display

## 2023-09-16 NOTE — Evaluation (Signed)
 Occupational Therapy Evaluation Patient Details Name: Tricia [name] MRN: [id number] DOB: 05/30/1960 Today's Date: 09/16/2023   History of Present Illness   Pt is a 63 y.o. female admitted with acute kidney injury superimposed on CKD, UTI, orthostatic hypotension with syncope, and rhabdomyolysis.  PMHx includes: COPD, Asthma, osteoarthritis, anxiety, depression, TypeII DM, Diastolic Dysfunction, GERD, HTN, Dyslipidemia, Rhabdomyolitis 2/2 fall, Parkinson's Disease, and syncope.     Clinical Impressions Pt was seen for OT re-evaluation this date. Goals were updated as pt is progressing in regard to her strength and tolerance. She continues to have pain and orthostatic hypotension intermittently that is affecting her progress and tolerance to therapy session at times. Pt currently requires supervision for bed mobility. CGA for STS from EOB and for step pivot BSC<>bed using RW with CGA. Pt is needing Min A for LB ADL management. She remains a high fall risk d/t her poor cognition and weakness with orthostatic hypotension issues. She will continue to benefit from skilled OT services to address noted impairments and functional limitations to maximize safety and independence while minimizing falls risk and caregiver burden. Do anticipate the need for follow up OT services upon acute hospital DC.      If plan is discharge home, recommend the following:   A little help with walking and/or transfers;A lot of help with bathing/dressing/bathroom;Direct supervision/assist for medications management;Supervision due to cognitive status;Direct supervision/assist for financial management;Assistance with cooking/housework;Assist for transportation;Help with stairs or ramp for entrance     Functional Status Assessment   Patient has had a recent decline in their functional status and demonstrates the ability to make significant improvements in function in a reasonable and predictable amount of  time.     Equipment Recommendations   Other (comment) (defer)     Recommendations for Other Services         Precautions/Restrictions   Precautions Precautions: Fall Recall of Precautions/Restrictions: Impaired Restrictions Weight Bearing Restrictions Per Provider Order: No Other Position/Activity Restrictions: Orthostatic Hypotension     Mobility Bed Mobility Overal bed mobility: Modified Independent Bed Mobility: Supine to Sit, Sit to Supine Rolling: Supervision   Supine to sit: Supervision Sit to supine: Supervision   General bed mobility comments: no physical assist needed for bed mobility this date    Transfers Overall transfer level: Needs assistance Equipment used: Rolling walker (2 wheels) Transfers: Sit to/from Stand Sit to Stand: Contact guard assist     Step pivot transfers: Contact guard assist     General transfer comment: CGA for STS from EOB and SPT to [facility] and back to bed this date      Balance Overall balance assessment: Needs assistance Sitting-balance support: Feet supported Sitting balance-Leahy Scale: Good     Standing balance support: Bilateral upper extremity supported, During functional activity, Reliant on assistive device for balance Standing balance-Leahy Scale: Fair Standing balance comment: CGA and RW use for SPT                           ADL either performed or assessed with clinical judgement   ADL Overall ADL's : Needs assistance/impaired                         Toilet Transfer: Contact guard assist;BSC/3in1;Stand-pivot Toilet Transfer Details (indicate cue type and reason): took a few steps to turn to sit on [facility] Toileting- Clothing Manipulation and Hygiene: Supervision/safety;Sitting/lateral lean  General ADL Comments: declined further activity d/t severe abdominal cramping this afternoon     Vision         Perception         Praxis         Pertinent Vitals/Pain Pain  Assessment Pain Assessment: 0-10 Pain Score: 10-Worst pain ever Pain Location: abdominal cramping Pain Descriptors / Indicators: Cramping Pain Intervention(s): Limited activity within patient's tolerance, Monitored during session, Repositioned     Extremity/Trunk Assessment Upper Extremity Assessment Upper Extremity Assessment: Generalized weakness   Lower Extremity Assessment Lower Extremity Assessment: Generalized weakness       Communication Communication Communication: No apparent difficulties   Cognition Arousal: Alert Behavior During Therapy: WFL for tasks assessed/performed                                 Following commands: Intact Following commands impaired: Only follows one step commands consistently, Follows one step commands with increased time     Cueing  General Comments   Cueing Techniques: Verbal cues;Visual cues      Exercises     Shoulder Instructions      Home Living Family/patient expects to be discharged to:: Private residence Living Arrangements: Other relatives (son-in-law) Available Help at Discharge: Available PRN/intermittently (S-in-L at work weekdays)   Home Access: Stairs to enter Secretary/administrator of Steps: 2 Entrance Stairs-Rails: None (apparently S-in-L is going to put up some rails soon) Home Layout: One level     Bathroom Shower/Tub: Tub/shower unit         Home Equipment: Agricultural consultant (2 wheels)          Prior Functioning/Environment Prior Level of Function : History of Falls (last six months)             Mobility Comments: Pt reports she has had 20-25 falls this year.  Also report she is still driving, out of the home almost daily and relatively active ADLs Comments: Pt. reports independence with ADLs, and IADLs    OT Problem List: Decreased strength;Decreased cognition;Impaired balance (sitting and/or standing);Decreased activity tolerance;Pain   OT Treatment/Interventions:  Self-care/ADL training;Therapeutic exercise;DME and/or AE instruction;Patient/family education;Therapeutic activities;Cognitive remediation/compensation;Energy conservation      OT Goals(Current goals can be found in the care plan section)   Acute Rehab OT Goals Patient Stated Goal: get stronger and improve function OT Goal Formulation: With patient Time For Goal Achievement: 09/30/23 Potential to Achieve Goals: Good ADL Goals Pt Will Perform Grooming: with set-up;standing Pt Will Perform Upper Body Bathing: with modified independence;sitting;with set-up Pt Will Perform Lower Body Dressing: with supervision;sit to/from stand;sitting/lateral leans Pt Will Transfer to Toilet: with supervision;regular height toilet;ambulating;grab bars   OT Frequency:  Min 2X/week    Co-evaluation              AM-PAC OT 6 Clicks Daily Activity     Outcome Measure Help from another person eating meals?: None Help from another person taking care of personal grooming?: None Help from another person toileting, which includes using toliet, bedpan, or urinal?: A Little Help from another person bathing (including washing, rinsing, drying)?: A Lot Help from another person to put on and taking off regular upper body clothing?: A Little Help from another person to put on and taking off regular lower body clothing?: A Lot 6 Click Score: 18   End of Session Equipment Utilized During Treatment: Rolling walker (2 wheels) Nurse Communication: Mobility status  Activity  Tolerance: Patient tolerated treatment well Patient left: in bed;with call bell/phone within reach;with bed alarm set  OT Visit Diagnosis: Unsteadiness on feet (R26.81);Muscle weakness (generalized) (M62.81);History of falling (Z91.81)                Time: 8363-8350 OT Time Calculation (min): 13 min Charges:  OT General Charges $OT Visit: 1 Visit OT Evaluation $OT Re-eval: 1 Re-eval Tricia [name], OTR/L  09/16/23, 5:41 PM  Tricia [name]  Tricia [name] 09/16/2023, 5:39 PM

## 2023-09-16 NOTE — Progress Notes (Signed)
 Physical Therapy Treatment Patient Details Name: Tricia [name] MRN: [id number] DOB: [birth year] Today's Date: 09/16/2023   History of Present Illness Pt is a 63 y.o. female admitted with acute kidney injury superimposed on CKD, UTI, orthostatic hypotension with syncope, and rhabdomyolysis.  PMHx includes: COPD, Asthma, osteoarthritis, anxiety, depression, TypeII DM, Diastolic Dysfunction, GERD, HTN, Dyslipidemia, Rhabdomyolitis 2/2 fall, Parkinson's Disease, and syncope.    PT Comments  Pt was pleasant and motivated to participate during the session and put forth good effort throughout. Pt required extra time and effort with bed mobility tasks and to come to standing from an elevated EOB but no physical assistance.  Pt did require +2 assist to come to standing from a standard height toilet, however, even with a grab bar to pull up from.  Pt demonstrated improved activity tolerance with gait compared to recent sessions and was able to amb 80 feet with slow cadence but with no overt LOB. Pt reported no adverse symptoms during the session other than abdominal pain with SpO2 and HR WNL throughout on room air. Pt will benefit from continued PT services upon discharge to safely address deficits listed in patient problem list for decreased caregiver assistance and eventual return to PLOF.       If plan is discharge home, recommend the following: Assistance with cooking/housework;Assist for transportation;Help with stairs or ramp for entrance;A lot of help with walking and/or transfers;A little help with bathing/dressing/bathroom   Can travel by private vehicle     No  Equipment Recommendations  Other (comment) (TBD)    Recommendations for Other Services       Precautions / Restrictions Precautions Precautions: Fall Restrictions Weight Bearing Restrictions Per Provider Order: No Other Position/Activity Restrictions: Orthostatic Hypotension     Mobility  Bed Mobility Overal bed  mobility: Modified Independent             General bed mobility comments: Min extra time, effort, and use of bed rail only during sup to sit    Transfers Overall transfer level: Needs assistance Equipment used: Rolling walker (2 wheels) Transfers: Sit to/from Stand Sit to Stand: Mod assist, +2 physical assistance           General transfer comment: Min extra time and effort to come to standing from an elevated EOB but required +2 mod A to come to standing from a standard height toilet even with grab bar to pull from    Ambulation/Gait Ambulation/Gait assistance: Contact guard assist Gait Distance (Feet): 80 Feet Assistive device: Rolling walker (2 wheels) Gait Pattern/deviations: Step-through pattern, Decreased step length - right, Decreased step length - left, Trunk flexed Gait velocity: decreased     General Gait Details: Slow cadence but steady with no overt LOB   Stairs             Wheelchair Mobility     Tilt Bed    Modified Rankin (Stroke Patients Only)       Balance Overall balance assessment: Needs assistance Sitting-balance support: Feet supported Sitting balance-Leahy Scale: Good     Standing balance support: Bilateral upper extremity supported, During functional activity, Reliant on assistive device for balance Standing balance-Leahy Scale: Fair                              Hotel manager: No apparent difficulties  Cognition Arousal: Alert Behavior During Therapy: WFL for tasks assessed/performed   PT - Cognitive impairments: No apparent impairments  Following commands: Intact      Cueing Cueing Techniques: Verbal cues, Visual cues  Exercises Total Joint Exercises Ankle Circles/Pumps: Strengthening, Both, 10 reps Straight Leg Raises: Strengthening, Both, 10 reps Long Arc Quad: Strengthening, Both, 10 reps Knee Flexion: Strengthening, Both, 10  reps Marching in Standing: Strengthening, Both, 10 reps, Standing Other Exercises Other Exercises: Sit to/from stand transfer training from various height surfaces    General Comments        Pertinent Vitals/Pain Pain Assessment Pain Assessment: 0-10 Pain Score: 2  Pain Location: abdominal cramping Pain Descriptors / Indicators: Cramping Pain Intervention(s): Repositioned, Monitored during session    Home Living                          Prior Function            PT Goals (current goals can now be found in the care plan section) Progress towards PT goals: Progressing toward goals    Frequency    Min 2X/week      PT Plan      Co-evaluation              AM-PAC PT 6 Clicks Mobility   Outcome Measure  Help needed turning from your back to your side while in a flat bed without using bedrails?: A Little Help needed moving from lying on your back to sitting on the side of a flat bed without using bedrails?: A Little Help needed moving to and from a bed to a chair (including a wheelchair)?: A Little Help needed standing up from a chair using your arms (e.g., wheelchair or bedside chair)?: A Little Help needed to walk in hospital room?: A Little Help needed climbing 3-5 steps with a railing? : A Lot 6 Click Score: 17    End of Session Equipment Utilized During Treatment: Gait belt Activity Tolerance: Patient tolerated treatment well Patient left: in chair;with call bell/phone within reach;with chair alarm set;with nursing/sitter in room Nurse Communication: Mobility status PT Visit Diagnosis: Muscle weakness (generalized) (M62.81);Difficulty in walking, not elsewhere classified (R26.2);Pain Pain - part of body:  (abdomen)     Time: 9063-9041 PT Time Calculation (min) (ACUTE ONLY): 22 min  Charges:    $Gait Training: 8-22 mins PT General Charges $$ ACUTE PT VISIT: 1 Visit                     D. Scott Elmor Kost PT, DPT 09/16/23, 10:44  AM

## 2023-09-16 NOTE — TOC Progression Note (Addendum)
 Transition of Care [facility]) - Progression Note    Patient Details  Name: Tricia [name] MRN: [id number] Date of Birth: [birth year]  Transition of Care [facility]) CM/SW Contact  [name], RN 09/16/2023, 10:08 AM  Clinical Narrative:     CM call to APS Supervisor, Jannis, phone: [phone] regarding APS report. Per Jannis, will have a DSS representative call CM. CM provided contact details for return call.  Call received from [place], [place] Intake for APS referral, phone: [phone] regarding APS report. Per Ellouise, report will be forwarded for review and followup.    CM initiated PASSR screening,reference number is [id number]. Manual review, request for additional documentation.   CM completed FL2 and 30 day PASSR noted. CM alert to Dr. Caleen for signature. CM faxed SNF referral to [facility] [address], [facility] [address], [address] [place] and [place].   Harlene Iba, SW arrived from [facility] DSS for face to face visit. Request per Harlene for psych consult note dated 09/03/2023, fax: [phone]. CM will fax.  Expected Discharge Plan: Skilled Nursing Facility Barriers to Discharge: Continued Medical Work up    Expected Discharge Plan and Services    SNF   Living arrangements for the past 2 months: Single Family Home    Social Drivers of Health (SDOH) Interventions SDOH Screenings   Food Insecurity: No Food Insecurity (08/29/2023)  Housing: Low Risk  (08/29/2023)  Transportation Needs: No Transportation Needs (08/29/2023)  Utilities: Not At Risk (08/29/2023)  Alcohol Screen: Low Risk  (03/07/2023)  Depression (PHQ2-9): High Risk (05/01/2023)  Financial Resource Strain: Patient Declined (06/18/2023)   Received from [facility]  Physical Activity: Sufficiently Active (03/07/2023)  Social Connections: Socially Isolated (08/29/2023)  Stress: No Stress Concern Present (03/07/2023)  Tobacco Use: High Risk (08/28/2023)  Health Literacy:  Adequate Health Literacy (03/07/2023)    Readmission Risk Interventions     No data to display

## 2023-09-17 DIAGNOSIS — E43 Unspecified severe protein-calorie malnutrition: Secondary | ICD-10-CM | POA: Insufficient documentation

## 2023-09-17 DIAGNOSIS — R55 Syncope and collapse: Secondary | ICD-10-CM | POA: Diagnosis not present

## 2023-09-17 DIAGNOSIS — R748 Abnormal levels of other serum enzymes: Secondary | ICD-10-CM | POA: Diagnosis not present

## 2023-09-17 DIAGNOSIS — E785 Hyperlipidemia, unspecified: Secondary | ICD-10-CM | POA: Diagnosis not present

## 2023-09-17 DIAGNOSIS — N179 Acute kidney failure, unspecified: Secondary | ICD-10-CM | POA: Diagnosis not present

## 2023-09-17 MED ORDER — KETOROLAC TROMETHAMINE 15 MG/ML IJ SOLN
15.0000 mg | Freq: Once | INTRAMUSCULAR | Status: AC
  Administered 2023-09-17: 15 mg via INTRAVENOUS
  Filled 2023-09-17: qty 1

## 2023-09-17 MED ORDER — VITAMIN C 500 MG PO TABS
500.0000 mg | ORAL_TABLET | Freq: Two times a day (BID) | ORAL | Status: DC
  Administered 2023-09-17 – 2023-09-18 (×2): 500 mg via ORAL
  Filled 2023-09-17 (×2): qty 1

## 2023-09-17 MED ORDER — ZINC SULFATE 220 (50 ZN) MG PO CAPS
220.0000 mg | ORAL_CAPSULE | Freq: Every day | ORAL | Status: DC
  Administered 2023-09-17 – 2023-09-18 (×2): 220 mg via ORAL
  Filled 2023-09-17 (×2): qty 1

## 2023-09-17 MED ORDER — SODIUM CHLORIDE 0.9 % IV SOLN
25.0000 mg | Freq: Once | INTRAVENOUS | Status: AC
  Administered 2023-09-17: 25 mg via INTRAVENOUS
  Filled 2023-09-17: qty 1

## 2023-09-17 NOTE — Progress Notes (Signed)
   09/17/23 1830  Spiritual Encounters  Type of Visit Initial  Care provided to: Patient  Referral source Nurse (RN/NT/LPN)  Reason for visit Routine spiritual support  OnCall Visit Yes   Chaplain responded to request to provide support for patient going through life and family transitions.

## 2023-09-17 NOTE — Progress Notes (Signed)
 Nutrition Follow-up  DOCUMENTATION CODES:   Severe malnutrition in context of acute illness/injury  INTERVENTION:   -Liberalize diet to regular for wider variety of meal selections -Continue Ensure Plus High Protein po TID, each supplement provides 350 kcal and 20 grams of protein  -Continue MVI with minerals daily -Continue 30 ml Prosource Plus BID, each supplement provides 100 kcals and 15 grams protein -Spiritual care consult -500 mg vita in C BID -220 mg zinc  sulfate daily x 14 days  NUTRITION DIAGNOSIS:   Severe Malnutrition related to acute illness (AKI) as evidenced by moderate fat depletion, severe fat depletion, moderate muscle depletion, severe muscle depletion.  Ongoing  GOAL:   Patient will meet greater than or equal to 90% of their needs  Progressing   MONITOR:   PO intake, Supplement acceptance  REASON FOR ASSESSMENT:   Consult Assessment of nutrition requirement/status  ASSESSMENT:   63 y/o female with h/o CKD III, HLD, Parkinson's dementia, anxiety, MDD, COPD, GERD, DM, CHF, chronic pain, HTN, gout, HOH and tremor who is admitted with syncope, AKI, orthostatic hypotension, polypharmacy and urinary retention.  Reviewed I/O's: +70 ml x 24 hours and -3.8 L since 09/03/23   Spoke with pt at bedside, who was pleasant and in good spirits today. Pt reports feeling better, but is frustrated over prolonged hospitalization. Pt reports I can't pee or poop; I have a catheter and have mucous coming out of my butt. Pt shares intake has improved. Noted meal completions 50-100%. Pt willing to continue with Ensure supplements. She shares she has also been consuming outside foods and frustrated over limitations of diet. She is requesting regular diet for wider variety of meal selections. She reports she consumed [employer] from family the other day and enjoyed this.   Per TOC notes, plan for SNF placement at discharge. DSS has also been contacted. Pt reports frustration of  prolonged hospitalization and misses her dogs. RD provided emotional support.   Medications reviewed and include vitamin C , calcium  carbonate, sinemet , colchicine , lovenox , bentyl , neurontin , remeron , protonix , and phenergan .   Labs reviewed: CBGS: 117.  Nutrition-Focused Physical Exam  Flowsheet Row Most Recent Value  Orbital Region Severe depletion  Upper Arm Region Severe depletion  Thoracic and Lumbar Region Moderate depletion  Buccal Region Moderate depletion  Temple Region Severe depletion  Clavicle Bone Region Severe depletion  Clavicle and Acromion Bone Region Severe depletion  Scapular Bone Region Severe depletion  Dorsal Hand Moderate depletion  Patellar Region Severe depletion  Anterior Thigh Region Severe depletion  Posterior Calf Region Severe depletion  Edema (RD Assessment) None  Hair Reviewed  Eyes Reviewed  Mouth Reviewed  Skin Reviewed  Nails Reviewed     Diet Order:   Diet Order             Diet regular Fluid consistency: Thin  Diet effective now                   EDUCATION NEEDS:   Education needs have been addressed  Skin:  Skin Assessment: Skin Integrity Issues: Skin Integrity Issues:: Stage II Stage II: sacrum  Last BM:  09/16/23 (type 7)  Height:   Ht Readings from Last 1 Encounters:  08/28/23 5' 8 (1.727 m)    Weight:   Wt Readings from Last 1 Encounters:  09/17/23 69.1 kg    Ideal Body Weight:  63.6 kg  BMI:  Body mass index is 23.16 kg/m.  Estimated Nutritional Needs:   Kcal:  1900-2200kcal/day  Protein:  95-110g/day  Fluid:  2.0-2.3L/day    Margery ORN, RD, LDN, CDCES Registered Dietitian III Certified Diabetes Care and Education Specialist If unable to reach this RD, please use RD Inpatient group chat on secure chat between hours of 8am-4 pm daily

## 2023-09-17 NOTE — Progress Notes (Signed)
 Occupational Therapy Treatment Patient Details Name: Tricia [name] MRN: [id number] DOB: 07-Mar-1960 Today's Date: 09/17/2023   History of present illness Pt is a 63 y.o. female admitted with acute kidney injury superimposed on CKD, UTI, orthostatic hypotension with syncope, and rhabdomyolysis.  PMHx includes: COPD, Asthma, osteoarthritis, anxiety, depression, TypeII DM, Diastolic Dysfunction, GERD, HTN, Dyslipidemia, Rhabdomyolitis 2/2 fall, Parkinson's Disease, and syncope.   OT comments  Pt seen for OT tx this date. Pt with continued c/o abdominal pain and cramping, with constant bowel urgency. Pt completes bed mobility with supervision, and 2x step pivot transfers from bed > BSC > recliner > BSC > recliner. Requires CGA for safety due to risk of falls and weakness. Pt presents with deficits in strength, cognition, balance and activity tolerance which warrant need for skilled OT services. Discharge recommendation appropriate, OT will continue to follow. Pt will require 24/7 supervision and assist at discharge.       If plan is discharge home, recommend the following:  A little help with walking and/or transfers;A lot of help with bathing/dressing/bathroom;Direct supervision/assist for medications management;Supervision due to cognitive status;Direct supervision/assist for financial management;Assistance with cooking/housework;Assist for transportation;Help with stairs or ramp for entrance   Equipment Recommendations  Other (comment)       Precautions / Restrictions Precautions Precautions: Fall Recall of Precautions/Restrictions: Impaired Restrictions Weight Bearing Restrictions Per Provider Order: No       Mobility Bed Mobility Overal bed mobility: Modified Independent Bed Mobility: Supine to Sit     Supine to sit: Supervision     General bed mobility comments: no physical assist, HOB elevated    Transfers Overall transfer level: Needs assistance Equipment used:  None Transfers: Sit to/from Stand Sit to Stand: Contact guard assist     Step pivot transfers: Contact guard assist           Balance Overall balance assessment: Needs assistance Sitting-balance support: Feet supported Sitting balance-Leahy Scale: Good     Standing balance support: Bilateral upper extremity supported, During functional activity, Reliant on assistive device for balance Standing balance-Leahy Scale: Fair Standing balance comment: CGA for standing pericare after small BM                           ADL either performed or assessed with clinical judgement   ADL Overall ADL's : Needs assistance/impaired     Grooming: Wash/dry hands;Brushing hair;Sitting Grooming Details (indicate cue type and reason): recliner level                 Toilet Transfer: Contact guard assist;BSC/3in1;Stand-pivot Statistician Details (indicate cue type and reason): step pivot from bed > BSC, takes a few steps towards recliner Toileting- Clothing Manipulation and Hygiene: Sit to/from stand;Contact guard assist       Functional mobility during ADLs: Contact guard assist General ADL Comments: pt with bowel urgency, completing multiple transfer t/f [facility]     Communication Communication Communication: No apparent difficulties   Cognition Arousal: Alert Behavior During Therapy: WFL for tasks assessed/performed Cognition: No family/caregiver present to determine baseline                               Following commands: Intact Following commands impaired: Only follows one step commands consistently, Follows one step commands with increased time      Cueing   Cueing Techniques: Verbal cues, Visual cues  Pertinent Vitals/ Pain       Pain Assessment Pain Assessment: Faces Faces Pain Scale: Hurts little more Pain Location: abdominal cramping Pain Descriptors / Indicators: Cramping Pain Intervention(s): Limited activity within patient's  tolerance, Monitored during session   Frequency  Min 2X/week        Progress Toward Goals  OT Goals(current goals can now be found in the care plan section)  Progress towards OT goals: Progressing toward goals  Acute Rehab OT Goals OT Goal Formulation: With patient Time For Goal Achievement: 09/30/23 Potential to Achieve Goals: Good ADL Goals Pt Will Perform Grooming: with set-up;standing Pt Will Perform Upper Body Bathing: with modified independence;sitting;with set-up Pt Will Perform Lower Body Dressing: with supervision;sit to/from stand;sitting/lateral leans Pt Will Transfer to Toilet: with supervision;regular height toilet;ambulating;grab bars  Plan         AM-PAC OT 6 Clicks Daily Activity     Outcome Measure   Help from another person eating meals?: None Help from another person taking care of personal grooming?: None Help from another person toileting, which includes using toliet, bedpan, or urinal?: A Little Help from another person bathing (including washing, rinsing, drying)?: A Lot Help from another person to put on and taking off regular upper body clothing?: A Little Help from another person to put on and taking off regular lower body clothing?: A Lot 6 Click Score: 18    End of Session    OT Visit Diagnosis: Unsteadiness on feet (R26.81);Muscle weakness (generalized) (M62.81);History of falling (Z91.81)   Activity Tolerance Patient tolerated treatment well   Patient Left in chair;with chair alarm set;with call bell/phone within reach   Nurse Communication Mobility status        Time: 8545-8476 OT Time Calculation (min): 29 min  Charges: OT General Charges $OT Visit: 1 Visit OT Treatments $Self Care/Home Management : 23-37 mins  Talor Desrosiers L. Elster Corbello, OTR/L  09/17/23, 3:24 PM

## 2023-09-17 NOTE — Progress Notes (Signed)
 Pt slept well last night. Foley is draining, Peri care and bath completed. PT is asking if she will go home with the foley. RN redirected her back to the provider for updated plan on discharge. Pt continues to c/o of abdominal cramping and is continuing to have loose mucous stools.

## 2023-09-17 NOTE — Progress Notes (Signed)
 Progress Note    Tricia [name]  FMW:[id number] DOB: August 26, 1960  DOA: 08/28/2023 PCP: Sowles, Krichna, MD      Brief Narrative:    Medical records reviewed and are as summarized below:  Tricia [name] is a 63 y.o. female with chronic pain, COPD, asthma, osteoarthritis, anxiety, depression, type 2 diabetes, diastolic CHF, GERD, hypertension, dyslipidemia, who presents to the ED with syncopal episode.  Initial vitals in the ED are 125/100, creatinine elevation of 2.55, CK  659.  Noncon head CT without acute intracranial abnormalities.  Patient received 2 L NS and was admitted.  Gradually her mental status has improved but she remains intermittently altered.  We have significant concerns about her ability to take care of herself at home.   7/23: Remained hemodynamically stable.  No TOC note for placement-sent another message.   7/24: Remained hemodynamically stable, patient does not have capacity to make decision per psychiatry-she was evaluated on 7/15 for capacity.  She wants to go home but unsafe environment with frequent falls and mixing up of medications.  Another message sent to [facility] as she is medically stable for the past many days with unnecessary length of stay now.  7/25: Hemodynamically stable but continued to have diarrhea-GI pathogen panel was negative.  No leukocytosis, mild hypokalemia which is being repleted.  Borderline magnesium  at 1.8.  B12 and folate levels were normal, vitamin D  low-starting on supplement. TOC still need to work on placement.  Patient remained medically stable for discharge.  7/26: Hemodynamically stable with mildly elevated blood pressure so decreasing the dose of midodrine  to 5 mg 3 times daily.  Discontinuing pyridostigmine  as it might be the cause of diarrhea and abdominal cramps.  Patient would like to discontinue Foley catheter with the hope that she can void now, ordered removal of Foley catheter to give her a voiding  trial.  Overnight fall while getting out of the bed, patient felt in her recliner-no reported injuries or loss of consciousness.  7/27: Continued to have crampy abdominal pain, significant urinary retention with postvoid volume of about 700 cc-one-time In-N-Out catheter was ordered, might need to replace Foley if she continued to have significant postvoid volume.  Patient wants to avoid catheter placement if possible. UA was sent as nursing concern of some purulence during In-N-Out catheter-UA pretty much the same as before with no leukocytosis so we will hold off antibiotics.  7/28: Hemodynamically stable with improving abdominal cramp and diarrhea.  Continue to have urinary retention so Foley catheter was replaced overnight. Still awaiting placement-apparently she has to go through guardianship protocol.  7/29: Remained hemodynamically stable.  Wants to go home, talked with son-in-law again today and he now agreed for her to come home stating that he can take care of her.  Discussed with TOC, they need to clarify it now with DSS as they are involved.  If they cleared her patient can go home with home health.   Assessment/Plan:   Principal Problem:   Acute kidney injury superimposed on chronic kidney disease (HCC) Active Problems:   Syncope, vasovagal   Elevated CK   Dyslipidemia   GERD without esophagitis   Parkinson disease (HCC)   Anxiety and depression   Chronic obstructive pulmonary disease (COPD) (HCC)   Type 2 diabetes mellitus with stage 3 chronic kidney disease (HCC)   Malnutrition of moderate degree   Protein-calorie malnutrition, severe   Nutrition Problem: Severe Malnutrition Etiology: acute illness (AKI)  Signs/Symptoms: moderate fat depletion, severe  fat depletion, moderate muscle depletion, severe muscle depletion   Body mass index is 23.16 kg/m.   S/p syncope: There is probably from orthostatic hypotension. S/p treatment with IV fluids. 2D echo showed  preserved EF, grade 1 diastolic dysfunction.   Recurrent orthostatic hypotension: This may be chronic.  Risk factors for this include polypharmacy, diabetes mellitus and Parkinson's disease. Decreasing the dose of midodrine  to 5 mg 3 times daily as blood pressure started trending up - Discontinuing pyridostigmine  as it might be the cause of her diarrhea and abdominal cramps-diarrhea and cramps now improving after discontinuing pyridostigmine  - Monitor orthostatic vitals  Polypharmacy, chronic pain, opioid dependence: - Patient is on multiple psychiatric medications, duplicate antihistamine therapy, as well as chronic opioid therapy - Home meds include: BuSpar , Cymbalta , Flexeril , Xyzal , gabapentin , morphine , MS Contin , Seroquel , hydroxyzine . - Hydroxyzine  and immediate release morphine  have already been discontinued.    - Continue OxyContin  for now.   Outpatient follow-up with pain clinic for de-escalation of opioids strongly recommended.   Urinary retention: Foley catheter placed after multiple In-N-Out ureteral catheterization.  Failed voiding trial on 09/01/2023.  She was evaluated by the urologist on 09/02/2023.  Foley catheter has been replaced.  It was recommended that patient be discharged on Foley catheter.  Outpatient follow-up with urologist for voiding trial. - Failed another voiding trial on 7/27 so Foley was replaced - Need outpatient follow-up with urology  Acute UTI: S/p treatment with IV ceftriaxone  followed by PO cefadroxil .  Completed a course of antibiotic Urine culture from 08/31/2023 showed Klebsiella pneumoniae and Proteus mirabilis. Repeat UA with similar findings,  so holding of  more antibiotics for now.  AKI, probable CKD stage IIIa: Creatinine is stable.  Elevated CK: Does not meet criteria for rhabdomyolysis.  CK improved from 659-547.  Hypokalemia: Resolved with potassium of 3.8 today  Hypomagnesemia: Improved - Continue to monitor  periodically  Hypophosphatemia: Repleted with potassium phosphate   Diarrhea: Imodium  as needed.  No indication for C. difficile testing at this time.  GI pathogen panel negative   Comorbidities include COPD, Parkinson's disease on carbidopa -levodopa , anxiety, depression, type II DM, GERD, moderate malnutrition, hypoalbuminemia   General Weakness: PT and OT recommended discharge to SNF.  Follow-up with TOC to assist with disposition.  Awaiting placement at SNF-medically stable for the past many days, now son-in-law agreed for her to return home but DSS is involved   Diet Order             Diet regular Fluid consistency: Thin  Diet effective now                  Consultants: Information systems manager  Procedures: None   Medications:    (feeding supplement) PROSource Plus  30 mL Oral BID BM   vitamin C   500 mg Oral BID   busPIRone   7.5 mg Oral BID   calcium  carbonate  500 mg of elemental calcium  Oral BID WC   carbidopa -levodopa   1 tablet Oral QHS   carbidopa -levodopa   1.5 tablet Oral TID   Chlorhexidine  Gluconate Cloth  6 each Topical Daily   colchicine   0.6 mg Oral Daily   cyclobenzaprine   10 mg Oral QHS   dicyclomine   10 mg Oral TID AC & HS   DULoxetine   60 mg Oral Daily   enoxaparin  (LOVENOX ) injection  40 mg Subcutaneous Q24H   feeding supplement  237 mL Oral TID BM   fluticasone   2 spray Each Nare Daily   gabapentin   400 mg Oral  TID   ipratropium  2 spray Each Nare QID   midodrine   5 mg Oral TID WC   mirtazapine   15 mg Oral QHS   montelukast   10 mg Oral QHS   multivitamin with minerals  1 tablet Oral Daily   oxyCODONE   15 mg Oral Q12H   pantoprazole   40 mg Oral Daily   QUEtiapine   25 mg Oral QHS   sodium chloride   1 spray Each Nare Q4H   theophylline   400 mg Oral Daily   umeclidinium bromide   1 puff Inhalation Daily   Vitamin D  (Ergocalciferol )  50,000 Units Oral Q7 days   zinc  sulfate (50mg  elemental zinc )  220 mg Oral Daily   Continuous Infusions:   promethazine  (PHENERGAN ) injection (IM or IVPB) 12.5 mg (09/12/23 1622)     Anti-infectives (From admission, onward)    Start     Dose/Rate Route Frequency Ordered Stop   09/03/23 1100  cefadroxil  (DURICEF) capsule 500 mg        500 mg Oral 2 times daily 09/03/23 1007 09/04/23 2144   08/31/23 2200  cefTRIAXone  (ROCEPHIN ) 1 g in sodium chloride  0.9 % 100 mL IVPB  Status:  Discontinued        1 g 200 mL/hr over 30 Minutes Intravenous Every 24 hours 08/31/23 2041 09/03/23 1007       Family Communication/Anticipated D/C date and plan/Code Status   DVT prophylaxis: Place TED hose Start: 09/02/23 1524 enoxaparin  (LOVENOX ) injection 40 mg Start: 08/30/23 2200     Code Status: Full Code  Family Communication: Talked with son in law on phone  Disposition Plan: Plan discharge to SNF-might be able to go home with home health   Status is: Inpatient Remains inpatient appropriate because: Awaiting placement to SNF, need clearance from DSS now   Subjective:  Patient was seen and examined today.  Feeling much improved and keep asking to go home.  Discussed with son-in-law on phone in front of her and according to him she can come home as he cannot take care of her.  He apparently can now have a motorized wheelchair and installed cameras in home.  Objective:    Vitals:   09/17/23 0405 09/17/23 0500 09/17/23 0731 09/17/23 1413  BP: 126/78  126/71 132/64  Pulse: (!) 110  92 84  Resp: 16  16 16   Temp: 98.1 F (36.7 C)  98.1 F (36.7 C) 98.3 F (36.8 C)  TempSrc: Oral  Oral   SpO2: 99%  99% 97%  Weight:  69.1 kg    Height:       Orthostatic VS for the past 24 hrs:  BP- Lying Pulse- Lying BP- Sitting Pulse- Sitting BP- Standing at 0 minutes Pulse- Standing at 0 minutes  09/17/23 0058 148/81 86 124/73 93 104/59 119  09/16/23 2000 148/81 86 124/73 93 104/59 119      Intake/Output Summary (Last 24 hours) at 09/17/2023 1536 Last data filed at 09/17/2023 1434 Gross per 24 hour   Intake 480 ml  Output 400 ml  Net 80 ml    Filed Weights   09/16/23 0457 09/16/23 0500 09/17/23 0500  Weight: 67 kg 67.1 kg 69.1 kg    Exam: General.  Frail lady, in no acute distress. Pulmonary.  Lungs clear bilaterally, normal respiratory effort. CV.  Regular rate and rhythm, no JVD, rub or murmur. Abdomen.  Soft, nontender, nondistended, BS positive. CNS.  Alert and oriented .  No focal neurologic deficit. Extremities.  No edema, no cyanosis,  pulses intact and symmetrical. Psychiatry.  Judgment and insight appears normal.   Data Reviewed:   I have personally reviewed following labs and imaging studies:  Labs: Labs show the following:   Basic Metabolic Panel: Recent Labs  Lab 09/12/23 0347 09/13/23 0453 09/14/23 0529 09/15/23 0618 09/15/23 1242  NA 139 141 140 139 143  K 2.4* 2.9* 4.0 3.1* 3.8  CL 107 108 112* 111 112*  CO2 24 25 21* 21* 23  GLUCOSE 113* 109* 109* 124* 133*  BUN 9 8 11 14 17   CREATININE 0.99 1.10* 0.93 0.85 1.05*  CALCIUM  7.6* 7.7* 7.7* 7.6* 7.9*  MG 1.8  --   --   --   --   PHOS 3.5  --   --   --   --    GFR Estimated Creatinine Clearance: 56 mL/min (A) (by C-G formula based on SCr of 1.05 mg/dL (H)). Liver Function Tests: No results for input(s): AST, ALT, ALKPHOS, BILITOT, PROT, ALBUMIN in the last 168 hours.  No results for input(s): LIPASE, AMYLASE in the last 168 hours. No results for input(s): AMMONIA in the last 168 hours. Coagulation profile No results for input(s): INR, PROTIME in the last 168 hours.  CBC: Recent Labs  Lab 09/13/23 1149 09/15/23 1242  WBC 9.9 9.0  HGB 10.6* 9.9*  HCT 32.3* 30.5*  MCV 98.5 101.0*  PLT 404* 302    Cardiac Enzymes: No results for input(s): CKTOTAL, CKMB, CKMBINDEX, TROPONINI in the last 168 hours.  BNP (last 3 results) No results for input(s): PROBNP in the last 8760 hours. CBG: Recent Labs  Lab 09/16/23 1143  GLUCAP 117*   D-Dimer: No results  for input(s): DDIMER in the last 72 hours. Hgb A1c: No results for input(s): HGBA1C in the last 72 hours. Lipid Profile: No results for input(s): CHOL, HDL, LDLCALC, TRIG, CHOLHDL, LDLDIRECT in the last 72 hours. Thyroid  function studies: No results for input(s): TSH, T4TOTAL, T3FREE, THYROIDAB in the last 72 hours.  Invalid input(s): FREET3 Anemia work up: No results for input(s): VITAMINB12, FOLATE, FERRITIN, TIBC, IRON, RETICCTPCT in the last 72 hours.  Sepsis Labs: Recent Labs  Lab 09/13/23 1149 09/15/23 1242  WBC 9.9 9.0     Microbiology Recent Results (from the past 240 hours)  Gastrointestinal Panel by PCR , Stool     Status: None   Collection Time: 09/13/23  9:56 AM   Specimen: Stool  Result Value Ref Range Status   Campylobacter species NOT DETECTED NOT DETECTED Final   Plesimonas shigelloides NOT DETECTED NOT DETECTED Final   Salmonella species NOT DETECTED NOT DETECTED Final   Yersinia enterocolitica NOT DETECTED NOT DETECTED Final   Vibrio species NOT DETECTED NOT DETECTED Final   Vibrio cholerae NOT DETECTED NOT DETECTED Final   Enteroaggregative E coli (EAEC) NOT DETECTED NOT DETECTED Final   Enteropathogenic E coli (EPEC) NOT DETECTED NOT DETECTED Final   Enterotoxigenic E coli (ETEC) NOT DETECTED NOT DETECTED Final   Shiga like toxin producing E coli (STEC) NOT DETECTED NOT DETECTED Final   Shigella/Enteroinvasive E coli (EIEC) NOT DETECTED NOT DETECTED Final   Cryptosporidium NOT DETECTED NOT DETECTED Final   Cyclospora cayetanensis NOT DETECTED NOT DETECTED Final   Entamoeba histolytica NOT DETECTED NOT DETECTED Final   Giardia lamblia NOT DETECTED NOT DETECTED Final   Adenovirus F40/41 NOT DETECTED NOT DETECTED Final   Astrovirus NOT DETECTED NOT DETECTED Final   Norovirus GI/GII NOT DETECTED NOT DETECTED Final   Rotavirus A NOT DETECTED  NOT DETECTED Final   Sapovirus (I, II, IV, and V) NOT DETECTED NOT DETECTED  Final    Comment: Performed at [facility], [address] [place], [place] [address]     Procedures and diagnostic studies:  No results found.   LOS: 19 days   This record has been created using Conservation officer, historic buildings. Errors have been sought and corrected,but may not always be located. Such creation errors do not reflect on the standard of care.   Amaryllis Dare  [employer] and [email or web address]. If 7PM-7AM, please contact night-coverage at [email or web address]  09/17/2023, 3:36 PM

## 2023-09-17 NOTE — Plan of Care (Signed)

## 2023-09-18 DIAGNOSIS — N189 Chronic kidney disease, unspecified: Secondary | ICD-10-CM | POA: Diagnosis not present

## 2023-09-18 DIAGNOSIS — N179 Acute kidney failure, unspecified: Secondary | ICD-10-CM | POA: Diagnosis not present

## 2023-09-18 LAB — CREATININE, SERUM
Creatinine, Ser: 1.01 mg/dL — ABNORMAL HIGH (ref 0.44–1.00)
GFR, Estimated: >60 mL/min (ref >60)

## 2023-09-18 NOTE — TOC Progression Note (Signed)
 Transition of Care [facility]) - Progression Note    Patient Details  Name: Tricia [name] MRN: [id number] Date of Birth: March 21, 1960  Transition of [facility]) CM/SW Contact  Marinda Cooks, RN Phone Number: 09/18/2023, 3:54 PM  Clinical Narrative:    This CM attempted to reach  APS Supervisor, Jannis, phone at [phone] ,DSS Intake  at [phone] &Tina, [phone] regarding APS report & clearance regarding plan was sent to VM message left. CM provided contact details for return call. TOC will cont to follow dc planning / care coordination and update as applicable .    Expected Discharge Plan: Skilled Nursing Facility Barriers to Discharge: Continued Medical Work up               Expected Discharge Plan and Services       Living arrangements for the past 2 months: Single Family Home                                       Social Drivers of Health (SDOH) Interventions SDOH Screenings   Food Insecurity: No Food Insecurity (08/29/2023)  Housing: Low Risk  (08/29/2023)  Transportation Needs: No Transportation Needs (08/29/2023)  Utilities: Not At Risk (08/29/2023)  Alcohol Screen: Low Risk  (03/07/2023)  Depression (PHQ2-9): High Risk (05/01/2023)  Financial Resource Strain: Patient Declined (06/18/2023)   Received from [facility]  Physical Activity: Sufficiently Active (03/07/2023)  Social Connections: Socially Isolated (08/29/2023)  Stress: No Stress Concern Present (03/07/2023)  Tobacco Use: High Risk (08/28/2023)  Health Literacy: Adequate Health Literacy (03/07/2023)    Readmission Risk Interventions     No data to display

## 2023-09-18 NOTE — Evaluation (Signed)
 Occupational Therapy Evaluation Patient Details Name: Tricia [name] MRN: [id number] DOB: [birth year] Today's Date: 09/18/2023   History of Present Illness   Pt is a 63 y.o. female admitted with acute kidney injury superimposed on CKD, UTI, orthostatic hypotension with syncope, and rhabdomyolysis.  PMHx includes: COPD, Asthma, osteoarthritis, anxiety, depression, TypeII DM, Diastolic Dysfunction, GERD, HTN, Dyslipidemia, Rhabdomyolitis 2/2 fall, Parkinson's Disease, and syncope.     Clinical Impressions Session focused on safe completion of ADLs, including grooming, toileting, dressing, ambulation with RW. Pt generally performed well, although did have one LOB while transferring to [facility]. Pt becomes easily distracted and can demonstrate limited attention to safety concerns; she perseverates on her wish to discharge home today. Pt denies dizziness this date, endorses 8/10 back pain, which she describes as my norm. Pt appears steadier today and is able to perform fxl mobility with reduced level of assistance. May be appropriate to modify DC recs if pt has consistent support in home setting.     If plan is discharge home, recommend the following:   A little help with walking and/or transfers;A lot of help with bathing/dressing/bathroom;Direct supervision/assist for medications management;Supervision due to cognitive status;Direct supervision/assist for financial management;Assistance with cooking/housework;Assist for transportation;Help with stairs or ramp for entrance     Functional Status Assessment   Patient has had a recent decline in their functional status and demonstrates the ability to make significant improvements in function in a reasonable and predictable amount of time.     Equipment Recommendations   None recommended by OT     Recommendations for Other Services         Precautions/Restrictions   Precautions Precautions: Fall Restrictions Weight Bearing  Restrictions Per Provider Order: No Other Position/Activity Restrictions: Orthostatic Hypotension     Mobility Bed Mobility Overal bed mobility: Needs Assistance Bed Mobility: Supine to Sit, Sit to Supine     Supine to sit: Supervision Sit to supine: Supervision   General bed mobility comments: no physical assist, HOB elevated    Transfers Overall transfer level: Needs assistance Equipment used: Rolling walker (2 wheels) Transfers: Bed to chair/wheelchair/BSC, Sit to/from Stand Sit to Stand: Supervision     Step pivot transfers: Contact guard assist, Min assist     General transfer comment: LOB with transfer to [facility]      Balance Overall balance assessment: Needs assistance Sitting-balance support: Feet supported Sitting balance-Leahy Scale: Good     Standing balance support: Bilateral upper extremity supported, During functional activity, Reliant on assistive device for balance Standing balance-Leahy Scale: Fair                             ADL either performed or assessed with clinical judgement   ADL Overall ADL's : Needs assistance/impaired     Grooming: Brushing hair;Sitting;Modified independent               Lower Body Dressing: Supervision/safety   Toilet Transfer: Minimal assistance;BSC/3in1;Stand-pivot   Toileting- Clothing Manipulation and Hygiene: Sit to/from stand;Contact guard assist Toileting - Clothing Manipulation Details (indicate cue type and reason): for pericare and donning/doffing brief       General ADL Comments: close SUPV-CGA for safety 2/2 imbalance     Vision         Perception         Praxis         Pertinent Vitals/Pain Pain Assessment Pain Assessment: 0-10 Pain Score: 8  Pain Location: back Pain  Descriptors / Indicators: Aching Pain Intervention(s): Repositioned     Extremity/Trunk Assessment Upper Extremity Assessment Upper Extremity Assessment: Generalized weakness   Lower Extremity  Assessment Lower Extremity Assessment: Generalized weakness       Communication Communication Communication: No apparent difficulties   Cognition Arousal: Alert Behavior During Therapy: WFL for tasks assessed/performed Cognition: No family/caregiver present to determine baseline                                 Following commands impaired: Follows one step commands inconsistently     Cueing  General Comments          Exercises     Shoulder Instructions      Home Living                                          Prior Functioning/Environment                      OT Problem List: Decreased strength;Decreased cognition;Impaired balance (sitting and/or standing);Decreased activity tolerance;Pain   OT Treatment/Interventions: Self-care/ADL training;Therapeutic exercise;DME and/or AE instruction;Patient/family education;Therapeutic activities;Cognitive remediation/compensation;Energy conservation      OT Goals(Current goals can be found in the care plan section)       OT Frequency:  Min 2X/week    Co-evaluation              AM-PAC OT 6 Clicks Daily Activity     Outcome Measure Help from another person eating meals?: None Help from another person taking care of personal grooming?: None Help from another person toileting, which includes using toliet, bedpan, or urinal?: A Little Help from another person bathing (including washing, rinsing, drying)?: A Lot Help from another person to put on and taking off regular upper body clothing?: A Little Help from another person to put on and taking off regular lower body clothing?: A Little 6 Click Score: 19   End of Session Equipment Utilized During Treatment: Rolling walker (2 wheels)  Activity Tolerance: Patient tolerated treatment well Patient left: in bed;with call bell/phone within reach;with bed alarm set  OT Visit Diagnosis: Unsteadiness on feet (R26.81);Muscle weakness  (generalized) (M62.81);History of falling (Z91.81)                Time: 8467-8443 OT Time Calculation (min): 24 min Charges:  OT General Charges $OT Visit: 1 Visit OT Treatments $Self Care/Home Management : 23-37 mins Suzen Hock, PhD, MS, OTR/L 09/18/23, 4:08 PM

## 2023-09-18 NOTE — Progress Notes (Signed)
 1300: Pt stated that she was going home today regardless of being discharged or not. Stated that she had no one at home to take care of her animals. Stated he had until 3pm to discharge her or she was going home.   1500: Pt stated she was going home when her son in law got here. Notified MD. Patient Attending Dr Roann explained to pt that he was unable to discharge her d/t safety concerns. Patient states she understands, but still wants to go home. Nonnie Sharps RN Charge nurse educated patient on AMA status. Pt still adamant about leaving.  1730: Pt son in law here to pick up patient. Spoke with patient and son in law about AMA. Son in law said its whatever she wants to do. Patient signed AMA form. Son in law to transport patient in wheelchair to car. Foley and IV discontinued.   1800: While patient was getting her things together in her room she was observed falling backwards and onto her bottom. Was able to put herself in wheelchair. Denies injury and wants to leave.   1815: Son in law transported patient off unit in wheelchair.

## 2023-09-18 NOTE — Plan of Care (Signed)

## 2023-09-18 NOTE — TOC Progression Note (Signed)
 Transition of Care [facility]) - Progression Note    Patient Details  Name: Tricia [name] MRN: [id number] Date of Birth: 06-27-1960  Transition of [facility]) CM/SW Contact  Tricia JAYSON Childes, Tricia [name] Phone Number: 09/18/2023, 9:16 AM  Clinical Narrative:    Attempt to reach Harlene Iba from [facility]. No answer. Left a message    Expected Discharge Plan: Skilled Nursing Facility Barriers to Discharge: Continued Medical Work up               Expected Discharge Plan and Services       Living arrangements for the past 2 months: Single Family Home                                       Social Drivers of Health (SDOH) Interventions SDOH Screenings   Food Insecurity: No Food Insecurity (08/29/2023)  Housing: Low Risk  (08/29/2023)  Transportation Needs: No Transportation Needs (08/29/2023)  Utilities: Not At Risk (08/29/2023)  Alcohol Screen: Low Risk  (03/07/2023)  Depression (PHQ2-9): High Risk (05/01/2023)  Financial Resource Strain: Patient Declined (06/18/2023)   Received from [facility]  Physical Activity: Sufficiently Active (03/07/2023)  Social Connections: Socially Isolated (08/29/2023)  Stress: No Stress Concern Present (03/07/2023)  Tobacco Use: High Risk (08/28/2023)  Health Literacy: Adequate Health Literacy (03/07/2023)    Readmission Risk Interventions     No data to display

## 2023-09-18 NOTE — Progress Notes (Signed)
 Progress Note   [name] FMW:[id number] DOB: 1960/07/19 DOA: 08/28/2023     20 DOS: the patient was seen and examined on 09/18/2023   Brief hospital course: Tricia [name] is a 63 y.o. female with chronic pain, COPD, asthma, osteoarthritis, anxiety, depression, type 2 diabetes, diastolic CHF, GERD, hypertension, dyslipidemia, who presents to the ED with syncopal episode.  Initial vitals in the ED are 125/100, creatinine elevation of 2.55, CK  659.  Noncon head CT without acute intracranial abnormalities.  Patient received 2 L NS and was admitted.  Gradually her mental status has improved but she remains intermittently altered.  We have significant concerns about her ability to take care of herself at home.    7/23: Remained hemodynamically stable.  No TOC note for placement-sent another message.    7/24: Remained hemodynamically stable, patient does not have capacity to make decision per psychiatry-she was evaluated on 7/15 for capacity.  She wants to go home but unsafe environment with frequent falls and mixing up of medications.  Another message sent to [facility] as she is medically stable for the past many days with unnecessary length of stay now.   7/25: Hemodynamically stable but continued to have diarrhea-GI pathogen panel was negative.  No leukocytosis, mild hypokalemia which is being repleted.  Borderline magnesium  at 1.8.  B12 and folate levels were normal, vitamin D  low-starting on supplement. TOC still need to work on placement.  Patient remained medically stable for discharge.   7/26: Hemodynamically stable with mildly elevated blood pressure so decreasing the dose of midodrine  to 5 mg 3 times daily.  Discontinuing pyridostigmine  as it might be the cause of diarrhea and abdominal cramps.  Patient would like to discontinue Foley catheter with the hope that she can void now, ordered removal of Foley catheter to give her a voiding trial.   Overnight fall while  getting out of the bed, patient felt in her recliner-no reported injuries or loss of consciousness.   7/27: Continued to have crampy abdominal pain, significant urinary retention with postvoid volume of about 700 cc-one-time In-N-Out catheter was ordered, might need to replace Foley if she continued to have significant postvoid volume.  Patient wants to avoid catheter placement if possible. UA was sent as nursing concern of some purulence during In-N-Out catheter-UA pretty much the same as before with no leukocytosis so we will hold off antibiotics.   7/28: Hemodynamically stable with improving abdominal cramp and diarrhea.  Continue to have urinary retention so Foley catheter was replaced overnight. Still awaiting placement-apparently she has to go through guardianship protocol.   7/29: Remained hemodynamically stable.  Wants to go home, talked with son-in-law again today and he now agreed for her to come home stating that he can take care of her.  Discussed with TOC, they need to clarify it now with DSS as they are involved.  If they cleared her patient can go home with home health.  7/30: TOC working with the DSS, I still do not have an answer whether we can safely discharge this patient home or not.  Waiting on TOC/DSS to clear for discharge home.    Assessment and Plan: Acute kidney injury superimposed on chronic kidney disease (HCC) - The patient will be admitted to a medical telemetry observation bed. - This could be prerenal AKI due to volume depletion and dehydration. - Will continue hydration with IV normal saline. - Will avoid nephrotoxins. - Will follow BMPs. -She has a Foley catheter, failed voiding  trial.  Syncope, vasovagal - Will check orthostatics q 12 hours. - Will obtain a bilateral 2D echo. - The patient will be gently hydrated with IV normal saline and monitored for arrhythmias. -Differential diagnoses would include neurally mediated syncope, cardiogenic, arrhythmias  related,  orthostatic hypotension and less likely hypoglycemia.    Elevated CK - This is secondary to her fall. - She will be hydrated with IV normal saline. - Will follow CK levels.  GERD without esophagitis - Will continue PPI therapy.  Dyslipidemia - Will hold off statin therapy given rhabdomyolysis  Type 2 diabetes mellitus with stage 3 chronic kidney disease (HCC) - The patient will be placed on supplemental coverage with NovoLog . - Will continue Jardiance   Chronic obstructive pulmonary disease (COPD) (HCC) - Will continue her inhalers.  Anxiety and depression - Will continue Cymbalta  and Seroquel  as well as BuSpar  and hydroxyzine .  Parkinson disease (HCC) Will continue Sinemet  IR.       Subjective: Feels better no complaints  Physical Exam: Vitals:   09/17/23 2008 09/18/23 0500 09/18/23 0758 09/18/23 1612  BP: 132/66  138/66 (!) 119/58  Pulse: 93  85 92  Resp: 16  17 18   Temp: 98 F (36.7 C)  97.6 F (36.4 C)   TempSrc:   Oral   SpO2: 100%  100% 99%  Weight:  65.8 kg    Height:       Constitutional: Alert, awake, calm, comfortable HEENT: Neck supple Respiratory: clear to auscultation bilaterally, no wheezing, no crackles. Normal respiratory effort. No accessory muscle use.  Cardiovascular: Regular rate and rhythm, no murmurs / rubs / gallops. No extremity edema. 2+ pedal pulses. No carotid bruits.  Abdomen: no tenderness, no masses palpated. No hepatosplenomegaly. Bowel sounds positive.  Foley catheter is in Musculoskeletal: no clubbing / cyanosis. No joint deformity upper and lower extremities. Good ROM, no contractures. Normal muscle tone.  Skin: no rashes, lesions, ulcers. No induration Neurologic: CN 2-12 grossly intact. Sensation intact, DTR normal. Strength 5/5 x all 4 extremities.  Psychiatric: Normal judgment and insight. Alert and oriented x 3. Normal mood.   Data Reviewed:  Potassium 3.8, BUN 17, creatinine 1.05  Family Communication: None  available  Disposition: Status is: Inpatient Remains inpatient appropriate because: Waiting on TOC/DSS for safe discharge  Planned Discharge Destination: Home with Home Health    Time spent: 35 minutes  Author: Nena Rebel, MD 09/18/2023 5:12 PM  For on call review [email or web address].

## 2023-09-18 NOTE — Progress Notes (Signed)
 PT Cancellation Note  Patient Details Name: Tricia [name] MRN: [id number] DOB: 31-Jan-1961   Cancelled Treatment:     PT attempt. Pt on BSC upon arrival for bowl movement. Will return later. Pt requested 3pm treatment time.    [name] 09/18/2023, 11:12 AM

## 2023-09-18 NOTE — Hospital Course (Addendum)
 Tricia [name] is a 63 y.o. female with chronic pain, COPD, asthma, osteoarthritis, anxiety, depression, type 2 diabetes, diastolic CHF, GERD, hypertension, dyslipidemia, who presents to the ED with syncopal episode.  Initial vitals in the ED are 125/100, creatinine elevation of 2.55, CK  659.  Noncon head CT without acute intracranial abnormalities.  Patient received 2 L NS and was admitted.  Gradually her mental status has improved but she remains intermittently altered.  We have significant concerns about her ability to take care of herself at home.    7/23: Remained hemodynamically stable.  No TOC note for placement-sent another message.    7/24: Remained hemodynamically stable, patient does not have capacity to make decision per psychiatry-she was evaluated on 7/15 for capacity.  She wants to go home but unsafe environment with frequent falls and mixing up of medications.  Another message sent to [facility] as she is medically stable for the past many days with unnecessary length of stay now.   7/25: Hemodynamically stable but continued to have diarrhea-GI pathogen panel was negative.  No leukocytosis, mild hypokalemia which is being repleted.  Borderline magnesium  at 1.8.  B12 and folate levels were normal, vitamin D  low-starting on supplement. TOC still need to work on placement.  Patient remained medically stable for discharge.   7/26: Hemodynamically stable with mildly elevated blood pressure so decreasing the dose of midodrine  to 5 mg 3 times daily.  Discontinuing pyridostigmine  as it might be the cause of diarrhea and abdominal cramps.  Patient would like to discontinue Foley catheter with the hope that she can void now, ordered removal of Foley catheter to give her a voiding trial.   Overnight fall while getting out of the bed, patient felt in her recliner-no reported injuries or loss of consciousness.   7/27: Continued to have crampy abdominal pain, significant urinary retention with  postvoid volume of about 700 cc-one-time In-N-Out catheter was ordered, might need to replace Foley if she continued to have significant postvoid volume.  Patient wants to avoid catheter placement if possible. UA was sent as nursing concern of some purulence during In-N-Out catheter-UA pretty much the same as before with no leukocytosis so we will hold off antibiotics.   7/28: Hemodynamically stable with improving abdominal cramp and diarrhea.  Continue to have urinary retention so Foley catheter was replaced overnight. Still awaiting placement-apparently she has to go through guardianship protocol.   7/29: Remained hemodynamically stable.  Wants to go home, talked with son-in-law again today and he now agreed for her to come home stating that he can take care of her.  Discussed with TOC, they need to clarify it now with DSS as they are involved.  If they cleared her patient can go home with home health.  7/30: TOC working with the DSS, I still do not have an answer whether we can safely discharge this patient home or not.  Waiting on TOC/DSS to clear for discharge home.  Patient was awaiting to get clearance from DSS and case management.  Patient decided to leave AGAINST MEDICAL ADVICE saying that she cannot wait until the clearance.

## 2023-09-18 NOTE — Progress Notes (Signed)
 Pt left AMA at this time with son in law via wheelchair to private vehicle.

## 2023-09-18 NOTE — Plan of Care (Signed)
  Problem: Education: Goal: Knowledge of General Education information will improve Description: Including pain rating scale, medication(s)/side effects and non-pharmacologic comfort measures 09/18/2023 0511 by Teresa Jon HERO, RN Outcome: Progressing 09/18/2023 0511 by Teresa Jon HERO, RN Outcome: Progressing   Problem: Health Behavior/Discharge Planning: Goal: Ability to manage health-related needs will improve 09/18/2023 0511 by Teresa Jon HERO, RN Outcome: Progressing 09/18/2023 0511 by Teresa Jon HERO, RN Outcome: Progressing   Problem: Clinical Measurements: Goal: Ability to maintain clinical measurements within normal limits will improve 09/18/2023 0511 by Teresa Jon HERO, RN Outcome: Progressing 09/18/2023 0511 by Teresa Jon HERO, RN Outcome: Progressing Goal: Will remain free from infection 09/18/2023 0511 by Teresa Jon HERO, RN Outcome: Progressing 09/18/2023 0511 by Teresa Jon HERO, RN Outcome: Progressing Goal: Diagnostic test results will improve 09/18/2023 0511 by Teresa Jon HERO, RN Outcome: Progressing 09/18/2023 0511 by Teresa Jon HERO, RN Outcome: Progressing Goal: Respiratory complications will improve 09/18/2023 0511 by Teresa Jon HERO, RN Outcome: Progressing 09/18/2023 0511 by Teresa Jon HERO, RN Outcome: Progressing Goal: Cardiovascular complication will be avoided 09/18/2023 0511 by Teresa Jon HERO, RN Outcome: Progressing 09/18/2023 0511 by Teresa Jon HERO, RN Outcome: Progressing   Problem: Activity: Goal: Risk for activity intolerance will decrease 09/18/2023 0511 by Teresa Jon HERO, RN Outcome: Progressing 09/18/2023 0511 by Teresa Jon HERO, RN Outcome: Progressing   Problem: Nutrition: Goal: Adequate nutrition will be maintained 09/18/2023 0511 by Teresa Jon HERO, RN Outcome: Progressing 09/18/2023 0511 by Teresa Jon HERO, RN Outcome: Progressing   Problem: Coping: Goal: Level of anxiety will decrease 09/18/2023 0511 by Teresa Jon HERO, RN Outcome:  Progressing 09/18/2023 0511 by Teresa Jon HERO, RN Outcome: Progressing   Problem: Elimination: Goal: Will not experience complications related to bowel motility 09/18/2023 0511 by Teresa Jon HERO, RN Outcome: Progressing 09/18/2023 0511 by Teresa Jon HERO, RN Outcome: Progressing Goal: Will not experience complications related to urinary retention 09/18/2023 0511 by Teresa Jon HERO, RN Outcome: Progressing 09/18/2023 0511 by Teresa Jon HERO, RN Outcome: Progressing   Problem: Pain Managment: Goal: General experience of comfort will improve and/or be controlled 09/18/2023 0511 by Teresa Jon HERO, RN Outcome: Progressing 09/18/2023 0511 by Teresa Jon HERO, RN Outcome: Progressing   Problem: Safety: Goal: Ability to remain free from injury will improve 09/18/2023 0511 by Teresa Jon HERO, RN Outcome: Progressing 09/18/2023 0511 by Teresa Jon HERO, RN Outcome: Progressing   Problem: Skin Integrity: Goal: Risk for impaired skin integrity will decrease 09/18/2023 0511 by Teresa Jon HERO, RN Outcome: Progressing 09/18/2023 0511 by Teresa Jon HERO, RN Outcome: Progressing

## 2023-09-22 ENCOUNTER — Inpatient Hospital Stay
Admission: EM | Admit: 2023-09-22 | Discharge: 2023-09-30 | DRG: 871 | Disposition: A | Discharge status: Skilled Nursing Facility | Attending: Osteopathic Medicine | Admitting: Osteopathic Medicine

## 2023-09-22 ENCOUNTER — Other Ambulatory Visit: Payer: Self-pay

## 2023-09-22 ENCOUNTER — Observation Stay

## 2023-09-22 ENCOUNTER — Emergency Department

## 2023-09-22 DIAGNOSIS — F411 Generalized anxiety disorder: Secondary | ICD-10-CM | POA: Diagnosis present

## 2023-09-22 DIAGNOSIS — L89312 Pressure ulcer of right buttock, stage 2: Secondary | ICD-10-CM | POA: Diagnosis present

## 2023-09-22 DIAGNOSIS — I110 Hypertensive heart disease with heart failure: Secondary | ICD-10-CM | POA: Diagnosis present

## 2023-09-22 DIAGNOSIS — Z87898 Personal history of other specified conditions: Secondary | ICD-10-CM

## 2023-09-22 DIAGNOSIS — K219 Gastro-esophageal reflux disease without esophagitis: Secondary | ICD-10-CM | POA: Diagnosis present

## 2023-09-22 DIAGNOSIS — H9192 Unspecified hearing loss, left ear: Secondary | ICD-10-CM | POA: Diagnosis present

## 2023-09-22 DIAGNOSIS — E119 Type 2 diabetes mellitus without complications: Secondary | ICD-10-CM

## 2023-09-22 DIAGNOSIS — L8990 Pressure ulcer of unspecified site, unspecified stage: Secondary | ICD-10-CM | POA: Insufficient documentation

## 2023-09-22 DIAGNOSIS — Z880 Allergy status to penicillin: Secondary | ICD-10-CM

## 2023-09-22 DIAGNOSIS — I951 Orthostatic hypotension: Secondary | ICD-10-CM | POA: Diagnosis present

## 2023-09-22 DIAGNOSIS — Z79891 Long term (current) use of opiate analgesic: Secondary | ICD-10-CM

## 2023-09-22 DIAGNOSIS — G8929 Other chronic pain: Secondary | ICD-10-CM | POA: Diagnosis present

## 2023-09-22 DIAGNOSIS — G9341 Metabolic encephalopathy: Secondary | ICD-10-CM | POA: Diagnosis present

## 2023-09-22 DIAGNOSIS — A0472 Enterocolitis due to Clostridium difficile, not specified as recurrent: Secondary | ICD-10-CM | POA: Diagnosis present

## 2023-09-22 DIAGNOSIS — Z8619 Personal history of other infectious and parasitic diseases: Secondary | ICD-10-CM

## 2023-09-22 DIAGNOSIS — E43 Unspecified severe protein-calorie malnutrition: Secondary | ICD-10-CM | POA: Diagnosis present

## 2023-09-22 DIAGNOSIS — Z9181 History of falling: Secondary | ICD-10-CM

## 2023-09-22 DIAGNOSIS — R6510 Systemic inflammatory response syndrome (SIRS) of non-infectious origin without acute organ dysfunction: Principal | ICD-10-CM | POA: Diagnosis present

## 2023-09-22 DIAGNOSIS — Z7984 Long term (current) use of oral hypoglycemic drugs: Secondary | ICD-10-CM

## 2023-09-22 DIAGNOSIS — Z1152 Encounter for screening for COVID-19: Secondary | ICD-10-CM

## 2023-09-22 DIAGNOSIS — W19XXXA Unspecified fall, initial encounter: Principal | ICD-10-CM

## 2023-09-22 DIAGNOSIS — A419 Sepsis, unspecified organism: Secondary | ICD-10-CM | POA: Diagnosis not present

## 2023-09-22 DIAGNOSIS — G20A1 Parkinson's disease without dyskinesia, without mention of fluctuations: Secondary | ICD-10-CM | POA: Diagnosis present

## 2023-09-22 DIAGNOSIS — Z79899 Other long term (current) drug therapy: Secondary | ICD-10-CM

## 2023-09-22 DIAGNOSIS — Z825 Family history of asthma and other chronic lower respiratory diseases: Secondary | ICD-10-CM

## 2023-09-22 DIAGNOSIS — F17200 Nicotine dependence, unspecified, uncomplicated: Secondary | ICD-10-CM | POA: Diagnosis present

## 2023-09-22 DIAGNOSIS — E46 Unspecified protein-calorie malnutrition: Secondary | ICD-10-CM | POA: Diagnosis present

## 2023-09-22 DIAGNOSIS — I5032 Chronic diastolic (congestive) heart failure: Secondary | ICD-10-CM | POA: Diagnosis present

## 2023-09-22 DIAGNOSIS — Z820 Family history of epilepsy and other diseases of the nervous system: Secondary | ICD-10-CM

## 2023-09-22 DIAGNOSIS — E785 Hyperlipidemia, unspecified: Secondary | ICD-10-CM | POA: Diagnosis present

## 2023-09-22 DIAGNOSIS — R5381 Other malaise: Secondary | ICD-10-CM

## 2023-09-22 DIAGNOSIS — R296 Repeated falls: Secondary | ICD-10-CM | POA: Diagnosis present

## 2023-09-22 DIAGNOSIS — N390 Urinary tract infection, site not specified: Secondary | ICD-10-CM | POA: Diagnosis present

## 2023-09-22 DIAGNOSIS — Z823 Family history of stroke: Secondary | ICD-10-CM

## 2023-09-22 DIAGNOSIS — Z6822 Body mass index (BMI) 22.0-22.9, adult: Secondary | ICD-10-CM

## 2023-09-22 DIAGNOSIS — Y92009 Unspecified place in unspecified non-institutional (private) residence as the place of occurrence of the external cause: Secondary | ICD-10-CM

## 2023-09-22 DIAGNOSIS — Z794 Long term (current) use of insulin: Secondary | ICD-10-CM

## 2023-09-22 DIAGNOSIS — R4182 Altered mental status, unspecified: Secondary | ICD-10-CM | POA: Diagnosis not present

## 2023-09-22 DIAGNOSIS — R197 Diarrhea, unspecified: Secondary | ICD-10-CM | POA: Diagnosis present

## 2023-09-22 DIAGNOSIS — Z7982 Long term (current) use of aspirin: Secondary | ICD-10-CM

## 2023-09-22 DIAGNOSIS — J449 Chronic obstructive pulmonary disease, unspecified: Secondary | ICD-10-CM | POA: Diagnosis present

## 2023-09-22 DIAGNOSIS — Z8249 Family history of ischemic heart disease and other diseases of the circulatory system: Secondary | ICD-10-CM

## 2023-09-22 DIAGNOSIS — J9611 Chronic respiratory failure with hypoxia: Secondary | ICD-10-CM | POA: Diagnosis present

## 2023-09-22 DIAGNOSIS — E8809 Other disorders of plasma-protein metabolism, not elsewhere classified: Secondary | ICD-10-CM | POA: Diagnosis present

## 2023-09-22 DIAGNOSIS — Z7951 Long term (current) use of inhaled steroids: Secondary | ICD-10-CM

## 2023-09-22 DIAGNOSIS — Z9981 Dependence on supplemental oxygen: Secondary | ICD-10-CM

## 2023-09-22 DIAGNOSIS — Z833 Family history of diabetes mellitus: Secondary | ICD-10-CM

## 2023-09-22 DIAGNOSIS — E876 Hypokalemia: Secondary | ICD-10-CM | POA: Diagnosis present

## 2023-09-22 DIAGNOSIS — F17210 Nicotine dependence, cigarettes, uncomplicated: Secondary | ICD-10-CM | POA: Diagnosis present

## 2023-09-22 DIAGNOSIS — J189 Pneumonia, unspecified organism: Secondary | ICD-10-CM | POA: Diagnosis present

## 2023-09-22 LAB — GLUCOSE, CAPILLARY: Glucose-Capillary: 61 mg/dL — ABNORMAL LOW (ref 70–99)

## 2023-09-22 LAB — COMPREHENSIVE METABOLIC PANEL WITH GFR
ALT: 6 U/L (ref 0–44)
AST: 44 U/L — ABNORMAL HIGH (ref 15–41)
Albumin: 2.2 g/dL — ABNORMAL LOW (ref 3.5–5.0)
Alkaline Phosphatase: 95 U/L (ref 38–126)
Anion gap: 9 (ref 5–15)
BUN: 22 mg/dL (ref 8–23)
CO2: 22 mmol/L (ref 22–32)
Calcium: 7.5 mg/dL — ABNORMAL LOW (ref 8.9–10.3)
Chloride: 106 mmol/L (ref 98–111)
Creatinine, Ser: 0.92 mg/dL (ref 0.44–1.00)
GFR, Estimated: >60 mL/min (ref >60)
Glucose, Bld: 91 mg/dL (ref 70–99)
Potassium: 3 mmol/L — ABNORMAL LOW (ref 3.5–5.1)
Sodium: 137 mmol/L (ref 135–145)
Total Bilirubin: 1.1 mg/dL (ref 0.0–1.2)
Total Protein: 5.5 g/dL — ABNORMAL LOW (ref 6.5–8.1)

## 2023-09-22 LAB — CBC
HCT: 28.6 % — ABNORMAL LOW (ref 36.0–46.0)
Hemoglobin: 9.3 g/dL — ABNORMAL LOW (ref 12.0–15.0)
MCH: 32.7 pg (ref 26.0–34.0)
MCHC: 32.5 g/dL (ref 30.0–36.0)
MCV: 100.7 fL — ABNORMAL HIGH (ref 80.0–100.0)
Platelets: 328 K/uL (ref 150–400)
RBC: 2.84 MIL/uL — ABNORMAL LOW (ref 3.87–5.11)
RDW: 16.7 % — ABNORMAL HIGH (ref 11.5–15.5)
WBC: 5.8 K/uL (ref 4.0–10.5)
nRBC: 0 % (ref 0.0–0.2)

## 2023-09-22 LAB — PROTIME-INR
INR: 1.4 — ABNORMAL HIGH (ref 0.8–1.2)
Prothrombin Time: 17.7 s — ABNORMAL HIGH (ref 11.4–15.2)

## 2023-09-22 LAB — RESP PANEL BY RT-PCR (RSV, FLU A&B, COVID)  RVPGX2
Influenza A by PCR: NEGATIVE
Influenza B by PCR: NEGATIVE
Resp Syncytial Virus by PCR: NEGATIVE
SARS Coronavirus 2 by RT PCR: NEGATIVE

## 2023-09-22 LAB — LACTIC ACID, PLASMA: Lactic Acid, Venous: 1.1 mmol/L (ref 0.5–1.9)

## 2023-09-22 MED ORDER — LACTATED RINGERS IV SOLN: INTRAVENOUS | Status: AC

## 2023-09-22 MED ORDER — FLUTICASONE FUROATE-VILANTEROL 200-25 MCG/ACT IN AEPB
1.0000 | INHALATION_SPRAY | Freq: Every day | RESPIRATORY_TRACT | Status: DC
  Administered 2023-09-23 – 2023-09-30 (×9): 1 via RESPIRATORY_TRACT
  Filled 2023-09-22: qty 28

## 2023-09-22 MED ORDER — DULOXETINE HCL 30 MG PO CPEP
60.0000 mg | ORAL_CAPSULE | Freq: Every day | ORAL | Status: DC
  Administered 2023-09-23 – 2023-09-30 (×9): 60 mg via ORAL
  Filled 2023-09-22 (×8): qty 2

## 2023-09-22 MED ORDER — SODIUM CHLORIDE 0.9 % IV SOLN
500.0000 mg | INTRAVENOUS | Status: DC
  Administered 2023-09-23 – 2023-09-26 (×4): 500 mg via INTRAVENOUS
  Filled 2023-09-22 (×5): qty 5

## 2023-09-22 MED ORDER — LACTATED RINGERS IV BOLUS (SEPSIS)
1000.0000 mL | Freq: Once | INTRAVENOUS | Status: AC
  Administered 2023-09-22: 1000 mL via INTRAVENOUS

## 2023-09-22 MED ORDER — ONDANSETRON HCL 4 MG/2ML IJ SOLN
4.0000 mg | Freq: Four times a day (QID) | INTRAMUSCULAR | Status: DC | PRN
  Administered 2023-09-24 – 2023-09-29 (×5): 4 mg via INTRAVENOUS
  Filled 2023-09-22 (×5): qty 2

## 2023-09-22 MED ORDER — QUETIAPINE FUMARATE 25 MG PO TABS
25.0000 mg | ORAL_TABLET | Freq: Every day | ORAL | Status: DC
  Administered 2023-09-23 – 2023-09-29 (×8): 25 mg via ORAL
  Filled 2023-09-22 (×8): qty 1

## 2023-09-22 MED ORDER — OXYCODONE HCL 5 MG PO TABS
5.0000 mg | ORAL_TABLET | ORAL | Status: DC | PRN
  Administered 2023-09-24 – 2023-09-27 (×9): 5 mg via ORAL
  Filled 2023-09-22 (×9): qty 1

## 2023-09-22 MED ORDER — ACETAMINOPHEN 650 MG RE SUPP: 650.0000 mg | Freq: Four times a day (QID) | RECTAL | Status: DC | PRN

## 2023-09-22 MED ORDER — INSULIN ASPART 100 UNIT/ML IJ SOLN: 0.0000 [IU] | Freq: Every day | INTRAMUSCULAR | Status: DC

## 2023-09-22 MED ORDER — EMPAGLIFLOZIN 25 MG PO TABS
25.0000 mg | ORAL_TABLET | Freq: Every day | ORAL | Status: DC
  Administered 2023-09-23 – 2023-09-30 (×9): 25 mg via ORAL
  Filled 2023-09-22 (×8): qty 1

## 2023-09-22 MED ORDER — CETIRIZINE HCL 10 MG PO TABS
10.0000 mg | ORAL_TABLET | Freq: Every evening | ORAL | Status: DC
Start: 2023-09-23 — End: 2023-09-30
  Administered 2023-09-23 – 2023-09-29 (×7): 10 mg via ORAL
  Filled 2023-09-22 (×8): qty 1

## 2023-09-22 MED ORDER — SODIUM CHLORIDE 0.9 % IV SOLN
2.0000 g | Freq: Once | INTRAVENOUS | Status: AC
  Administered 2023-09-22: 2 g via INTRAVENOUS
  Filled 2023-09-22: qty 20

## 2023-09-22 MED ORDER — SODIUM CHLORIDE 0.9 % IV SOLN
2.0000 g | INTRAVENOUS | Status: DC
  Administered 2023-09-23 – 2023-09-25 (×3): 2 g via INTRAVENOUS
  Filled 2023-09-22 (×4): qty 20

## 2023-09-22 MED ORDER — INSULIN ASPART 100 UNIT/ML IJ SOLN
0.0000 [IU] | Freq: Three times a day (TID) | INTRAMUSCULAR | Status: DC
  Administered 2023-09-23: 5 [IU] via SUBCUTANEOUS
  Administered 2023-09-24 – 2023-09-29 (×3): 1 [IU] via SUBCUTANEOUS
  Filled 2023-09-22 (×5): qty 1

## 2023-09-22 MED ORDER — ACETAMINOPHEN 325 MG PO TABS
650.0000 mg | ORAL_TABLET | Freq: Four times a day (QID) | ORAL | Status: DC | PRN
  Administered 2023-09-22 – 2023-09-30 (×5): 650 mg via ORAL
  Filled 2023-09-22 (×4): qty 2

## 2023-09-22 MED ORDER — IPRATROPIUM-ALBUTEROL 0.5-2.5 (3) MG/3ML IN SOLN
3.0000 mL | Freq: Four times a day (QID) | RESPIRATORY_TRACT | Status: DC | PRN
  Administered 2023-09-29: 3 mL via RESPIRATORY_TRACT
  Filled 2023-09-22: qty 3

## 2023-09-22 MED ORDER — ONDANSETRON HCL 4 MG PO TABS
4.0000 mg | ORAL_TABLET | Freq: Four times a day (QID) | ORAL | Status: DC | PRN
  Administered 2023-09-26 – 2023-09-30 (×5): 4 mg via ORAL
  Filled 2023-09-22 (×4): qty 1

## 2023-09-22 MED ORDER — VANCOMYCIN HCL IN DEXTROSE 1-5 GM/200ML-% IV SOLN
1000.0000 mg | Freq: Once | INTRAVENOUS | Status: AC
  Administered 2023-09-22: 1000 mg via INTRAVENOUS
  Filled 2023-09-22: qty 200

## 2023-09-22 MED ORDER — ASPIRIN 81 MG PO TBEC
81.0000 mg | DELAYED_RELEASE_TABLET | Freq: Every day | ORAL | Status: DC
  Administered 2023-09-23 – 2023-09-30 (×9): 81 mg via ORAL
  Filled 2023-09-22 (×8): qty 1

## 2023-09-22 MED ORDER — COLCHICINE 0.6 MG PO TABS: 0.6000 mg | ORAL_TABLET | Freq: Two times a day (BID) | ORAL | Status: DC | PRN

## 2023-09-22 MED ORDER — ENOXAPARIN SODIUM 40 MG/0.4ML IJ SOSY
40.0000 mg | PREFILLED_SYRINGE | INTRAMUSCULAR | Status: DC
  Administered 2023-09-23 – 2023-09-30 (×9): 40 mg via SUBCUTANEOUS
  Filled 2023-09-22 (×8): qty 0.4

## 2023-09-22 MED ORDER — LACTATED RINGERS IV SOLN
150.0000 mL/h | INTRAVENOUS | Status: AC
  Administered 2023-09-23 (×2): 150 mL/h via INTRAVENOUS

## 2023-09-22 MED ORDER — CARBIDOPA-LEVODOPA 25-100 MG PO TABS
1.5000 | ORAL_TABLET | Freq: Three times a day (TID) | ORAL | Status: DC
  Administered 2023-09-23 – 2023-09-30 (×23): 1.5 via ORAL
  Filled 2023-09-22 (×22): qty 2

## 2023-09-22 NOTE — Assessment & Plan Note (Addendum)
 Holding buspirone  and hydroxyzine  tonight.  Continue duloxetine  and Seroquel 

## 2023-09-22 NOTE — Assessment & Plan Note (Signed)
 Continue Sinemet  Will hold gabapentin  for tonight

## 2023-09-22 NOTE — Consult Note (Signed)
 CODE SEPSIS - PHARMACY COMMUNICATION  **Broad-spectrum antimicrobials should be administered within one hour of sepsis diagnosis**  Time Code Sepsis call or page was received: 2030  Antibiotics ordered: Ceftriaxone , Vancomycin   Time of first antibiotic administration: 2043  Additional action taken by pharmacy: N/A  If necessary, name of provider/nurse contacted: N/A    Will M. Lenon, PharmD Clinical Pharmacist 09/22/2023 9:35 PM

## 2023-09-22 NOTE — Assessment & Plan Note (Addendum)
 History of recurrent syncope Orthostatic hypotension - has required midodrine  Possible polypharmacy Received an IV fluid bolus in the ED Will get orthostatics every shift in view of history of Parkinson's EF 60 to 65%, G1 DD 08/30/2023 Continuous cardiac monitoring Neurologic checks with fall and aspiration precautions Minimize sedating meds if possible(BuSpar , hydroxyzine , cyclobenzaprine , gabapentin , long and short acting opiates, Seroquel ) PT and TOC consult

## 2023-09-22 NOTE — Sepsis Progress Note (Signed)
 Elink following code sepsis

## 2023-09-22 NOTE — Assessment & Plan Note (Addendum)
 Frequent falls Possible polypharmacy Physical deconditioning  Protein calorie malnutrition/hypoproteinemia PT, TOC, dietary eval Fall precautions Will get orthostatic vital signs and monitor on telemetry

## 2023-09-22 NOTE — Assessment & Plan Note (Signed)
 Takes furosemide

## 2023-09-22 NOTE — ED Triage Notes (Signed)
 Patient brought in via [facility] today from home with complaints of fall and altered mental status. Patient fell down around 7am this morning and has been in the floor since then as she was unable to get herself up off the floor. Husband and son in law waited to call 911 until tonight. Presents with lac to forehead which she states happened days ago. EMS also reports she has bed sore on her back that appears infected.

## 2023-09-22 NOTE — ED Provider Notes (Signed)
 [facility] Provider Note   Event Date/Time   First MD Initiated Contact with Patient 09/22/23 2028     (approximate) History  Fall and Altered Mental Status  HPI Tricia [name] is a 63 y.o. female with a past medical history of CHF, type 2 diabetes, generalized anxiety disorder, Parkinson's disease, hypertension, and hyperlipidemia who presents via EMS after a fall at home today and altered mental status.  Patient states that she fell around 7 AM this morning and has been on the floor since then being unable to get herself up.  Patient reportedly was told by family that if she did not want to call EMS or go to the hospital that she can stay on the floor.  Patient then told husband and son to call 911 tonight and presented to our emergency department.  Patient states that she has a small sore on her back where she has been laying for most of the last week.  Patient also states she has a small laceration of the forehead that was caused by a fall approximately 3 days prior to arrival.  Patient denies any other complaints at this time ROS: Patient currently denies any vision changes, tinnitus, difficulty speaking, facial droop, sore throat, chest pain, shortness of breath, abdominal pain, nausea/vomiting/diarrhea, dysuria, or weakness/numbness/paresthesias in any extremity   Physical Exam  Triage Vital Signs: ED Triage Vitals  Encounter Vitals Group     BP 09/22/23 2022 111/66     Girls Systolic BP Percentile --      Girls Diastolic BP Percentile --      Boys Systolic BP Percentile --      Boys Diastolic BP Percentile --      Pulse Rate 09/22/23 2022 (!) 101     Resp 09/22/23 2022 18     Temp 09/22/23 2022 100.1 F (37.8 C)     Temp Source 09/22/23 2022 Oral     SpO2 09/22/23 2018 98 %     Weight 09/22/23 2022 145 lb 1 oz (65.8 kg)     Height 09/22/23 2022 5' 8 (1.727 m)     Head Circumference --      Peak Flow --      Pain Score 09/22/23 2022 8      Pain Loc --      Pain Education --      Exclude from Growth Chart --    Most recent vital signs: Vitals:   09/22/23 2332 09/22/23 2338  BP: 124/67   Pulse: (!) 55 (!) 105  Resp:    Temp: (!) 101.1 F (38.4 C)   SpO2:  97%   General: Awake, oriented x4. CV:  Good peripheral perfusion. Resp:  Normal effort. Abd:  No distention. Other:  Middle-aged disheveled Caucasian female resting comfortably in no acute distress ED Results / Procedures / Treatments  Labs (all labs ordered are listed, but only abnormal results are displayed) Labs Reviewed  COMPREHENSIVE METABOLIC PANEL WITH GFR - Abnormal; Notable for the following components:      Result Value   Potassium 3.0 (*)    Calcium  7.5 (*)    Total Protein 5.5 (*)    Albumin 2.2 (*)    AST 44 (*)    All other components within normal limits  CBC - Abnormal; Notable for the following components:   RBC 2.84 (*)    Hemoglobin 9.3 (*)    HCT 28.6 (*)    MCV 100.7 (*)  RDW 16.7 (*)    All other components within normal limits  PROTIME-INR - Abnormal; Notable for the following components:   Prothrombin Time 17.7 (*)    INR 1.4 (*)    All other components within normal limits  GLUCOSE, CAPILLARY - Abnormal; Notable for the following components:   Glucose-Capillary 61 (*)    All other components within normal limits  RESP PANEL BY RT-PCR (RSV, FLU A&B, COVID)  RVPGX2  CULTURE, BLOOD (ROUTINE X 2)  CULTURE, BLOOD (ROUTINE X 2)  LACTIC ACID, PLASMA  URINALYSIS, W/ REFLEX TO CULTURE (INFECTION SUSPECTED)  CK  PROCALCITONIN  COMPREHENSIVE METABOLIC PANEL WITH GFR  CBC  CBG MONITORING, ED   EKG ED ECG REPORT I, Artist MARLA Kerns, the attending physician, personally viewed and interpreted this ECG. Date: 09/22/2023 EKG Time: 2054 Rate: 99 Rhythm: normal sinus rhythm QRS Axis: normal Intervals: normal ST/T Wave abnormalities: normal Narrative Interpretation: no evidence of acute ischemia RADIOLOGY ED MD interpretation:  Single view portable chest x-ray shows patchy airspace opacities in the right mid and lower lung concerning for atelectasis versus infiltrate - All radiology independently interpreted and agree with radiology assessment Official radiology report(s): DG Chest Port 1 View Result Date: 09/22/2023 EXAM: 1 VIEW XRAY OF THE CHEST 09/22/2023 08:55:28 PM COMPARISON: 09/08/2021 and 08/22/2022. CLINICAL HISTORY: Questionable sepsis - evaluate for abnormality. PER ER NOTE; Patient brought in via [facility] today from home with complaints of fall and altered mental status. Patient fell down around 7am this morning and has been in the floor since then as she was unable to get herself up off the floor. Husband and son ; in law waited to call 911 until tonight. Presents with lac to forehead which she states happened days ago. EMS also reports she has bed sore on her back that appears infected. FINDINGS: LUNGS AND PLEURA: Patchy airspace opacities in the right mid and lower lung may be due to atelectasis or infiltrates. Consider CT for further evaluation. No pleural effusion or pneumothorax. HEART AND MEDIASTINUM: Stable cardiomediastinal silhouette. BONES AND SOFT TISSUES: No acute osseous abnormality. IMPRESSION: 1. Patchy airspace opacities in the right mid and lower lung, possibly due to atelectasis or infiltrates. Consider CT for further evaluation. 2. No pleural effusion or pneumothorax. Electronically signed by: Norman Gatlin MD 09/22/2023 09:09 PM EDT RP Workstation: [id number]   PROCEDURES: Critical Care performed: No Procedures MEDICATIONS ORDERED IN ED: Medications  lactated ringers  infusion (has no administration in time range)  aspirin  EC tablet 81 mg (has no administration in time range)  colchicine  tablet 0.6 mg (has no administration in time range)  DULoxetine  (CYMBALTA ) DR capsule 60 mg (has no administration in time range)  QUEtiapine  (SEROQUEL ) tablet 25 mg (has no administration in time range)   empagliflozin  (JARDIANCE ) tablet 25 mg (has no administration in time range)  carbidopa -levodopa  (SINEMET  IR) 25-100 MG per tablet immediate release 1.5 tablet (has no administration in time range)  ipratropium-albuterol  (DUONEB) 0.5-2.5 (3) MG/3ML nebulizer solution 3 mL (has no administration in time range)  cetirizine  (ZYRTEC ) tablet 10 mg (has no administration in time range)  fluticasone  furoate-vilanterol (BREO ELLIPTA ) 200-25 MCG/ACT 1 puff (has no administration in time range)  lactated ringers  infusion (has no administration in time range)  enoxaparin  (LOVENOX ) injection 40 mg (has no administration in time range)  cefTRIAXone  (ROCEPHIN ) 2 g in sodium chloride  0.9 % 100 mL IVPB (has no administration in time range)  azithromycin  (ZITHROMAX ) 500 mg in sodium chloride  0.9 % 250 mL  IVPB (has no administration in time range)  acetaminophen  (TYLENOL ) tablet 650 mg (650 mg Oral Given 09/22/23 2339)    Or  acetaminophen  (TYLENOL ) suppository 650 mg ( Rectal See Alternative 09/22/23 2339)  oxyCODONE  (Oxy IR/ROXICODONE ) immediate release tablet 5 mg (has no administration in time range)  ondansetron  (ZOFRAN ) tablet 4 mg (has no administration in time range)    Or  ondansetron  (ZOFRAN ) injection 4 mg (has no administration in time range)  insulin  aspart (novoLOG ) injection 0-5 Units (has no administration in time range)  insulin  aspart (novoLOG ) injection 0-9 Units (has no administration in time range)  lactated ringers  bolus 1,000 mL (0 mLs Intravenous Stopped 09/22/23 2201)    And  lactated ringers  bolus 1,000 mL (0 mLs Intravenous Stopped 09/22/23 2329)  cefTRIAXone  (ROCEPHIN ) 2 g in sodium chloride  0.9 % 100 mL IVPB (0 g Intravenous Stopped 09/22/23 2134)  vancomycin  (VANCOCIN ) IVPB 1000 mg/200 mL premix (0 mg Intravenous Stopped 09/22/23 2251)   IMPRESSION / MDM / ASSESSMENT AND PLAN / ED COURSE  I reviewed the triage vital signs and the nursing notes.                             The patient is  on the cardiac monitor to evaluate for evidence of arrhythmia and/or significant heart rate changes. Patient's presentation is most consistent with acute presentation with potential threat to life or bodily function. Presents with shortness of breath and malaise after a fall from standing and being unable to get up for the last 12 hours  DDx: PE, COPD exacerbation, Pneumothorax, TB, Atypical ACS, Esophageal Rupture, Toxic Exposure, Foreign Body Airway Obstruction.  Workup: CXR CBC, CMP, lactate, troponin  Given History, Exam, and Workup presentation most consistent with pneumonia.  Findings: Right middle and lower lobe pneumonia  Tx: Ceftriaxone  1g IV Azithromycin  500mg  IV  Disposition: Admit   FINAL CLINICAL IMPRESSION(S) / ED DIAGNOSES   Final diagnoses:  Fall, initial encounter  Altered mental status, unspecified altered mental status type  Pneumonia of right middle lobe due to infectious organism   Rx / DC Orders   ED Discharge Orders     None      Note:  This document was prepared using Dragon voice recognition software and may include unintentional dictation errors.   Logyn Kendrick K, MD 09/23/23 [phone]

## 2023-09-22 NOTE — Assessment & Plan Note (Addendum)
 Chronic prescription opioid use Continue Xtampza  and MS IR pending verification Continue Cymbalta 

## 2023-09-22 NOTE — Assessment & Plan Note (Addendum)
 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

## 2023-09-22 NOTE — Assessment & Plan Note (Addendum)
 Clinically euvolemic to dry EF 60 to 65%, G1 DD 08/30/2023 Continue Jardiance .  Will hold furosemide  tonight

## 2023-09-22 NOTE — Assessment & Plan Note (Addendum)
 Bladder scan as needed Has required Foley during hospital stay from 7/9 - 09/18/23

## 2023-09-22 NOTE — H&P (Signed)
 History and Physical    [name] FMW:[id number] DOB: 1960-09-03 DOA: 09/22/2023 DOS: the patient was seen and examined on 09/22/2023 PCP: Glenard Mire, MD  Patient coming from: Home  Chief Complaint:  Chief Complaint  Patient presents with   Fall   Altered Mental Status    HPI: Tricia [name] is a 63 y.o. 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 She had no loss of consciousness and reports no injury. Patient's recent hospital stay was complicated by urinary retention requiring Foley and a DSS referral due to concerns for inadequate care at home.  She also had episodes of hypotension treated with midodrine .    In the ED, tachycardic to 101 and febrile to 100.1, BP 111/66. Labs notable for normal WBC of 5.8 and normal lactic acid of 1.1.  Hemoglobin 9.3 (baseline 9.6-11.5).  Potassium 3.0.  AST 44, hypoalbuminemia with albumin 2.2 and total protein 5.5, calcium  7.5.  Creatinine normal. Respiratory viral panel negative for COVID flu and RSV. Urinalysis pending. CK pending EKG showing NSR at 99 with no concerning ischemic findings Chest x-ray concerning for patchy infiltrates as follows: 1. Patchy airspace opacities in the right mid and lower lung, possibly due to atelectasis or infiltrates. Consider CT for further evaluation. 2. No pleural effusion or pneumothorax  Patient was given a fluid bolus, started on  Rocephin  and vancomycin . Admission requested     Past Medical History:  Diagnosis Date   Anxiety    Arthritis    joints and hands/ knees   Asthma    uses inhaler   Benign essential tremor    head   Cervical dystonia    neck pain   Cholesteatoma of left ear    x2   COPD (chronic obstructive pulmonary disease) (HCC)    Cough    Depression    Diabetes mellitus without complication (HCC)    type 2   Diastolic dysfunction    Dyspnea    Dysrhythmia    diastolic dysfunction   GERD (gastroesophageal reflux disease)    Headache    migraines/ one per week   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   Hyperlipidemia    Hypertension    Motion sickness    boat   Neuromuscular disorder (HCC)    neuropathy feet and hands( nerve damage)   Wears dentures    upper and lower   Past Surgical History:  Procedure Laterality Date   CARPAL TUNNEL RELEASE Bilateral    x2 right, 1x on left   COLONOSCOPY     COLONOSCOPY WITH PROPOFOL  N/A 04/25/2017   Procedure: COLONOSCOPY WITH PROPOFOL ;  Surgeon: Jinny Carmine, MD;  Location: [facility];  Service: Endoscopy;  Laterality: N/A;  diabetic-oral med   DILATION AND CURETTAGE OF UTERUS     ESOPHAGOGASTRODUODENOSCOPY (EGD) WITH PROPOFOL  N/A 01/10/2021   Procedure: ESOPHAGOGASTRODUODENOSCOPY (EGD) WITH PROPOFOL ;  Surgeon: Janalyn Keene NOVAK, MD;  Location:  [facility];  Service: Endoscopy;  Laterality: N/A;  Diabetic   EXTERNAL EAR SURGERY Left    x2   POLYPECTOMY  04/25/2017   Procedure: POLYPECTOMY INTESTINAL;  Surgeon: Jinny Carmine, MD;  Location: [facility];  Service: Endoscopy;;   SPINE SURGERY     herniated disc   TUBAL LIGATION     Social History:  reports that she has been smoking cigarettes. She started smoking about 46 years ago. She has a 46.3 pack-year smoking history. She has never used smokeless tobacco. She reports that she does not drink alcohol and does not use drugs.  Allergies  Allergen  Reactions   Augmentin [Amoxicillin-Pot Clavulanate] Diarrhea   Penicillins Itching    Family History  Problem Relation Age of Onset   Emphysema Mother    Anxiety disorder Mother    Stroke Father    Throat cancer Father    Lung cancer Maternal Grandmother    Lung cancer Maternal Grandfather    Hypertension Daughter    Diabetes Daughter    Multiple sclerosis Daughter    Bipolar disorder Daughter    Cervical cancer Daughter    Bipolar disorder Daughter    Drug abuse Daughter    Lung cancer Maternal Aunt    Lung cancer Maternal Uncle     Prior to Admission medications   Medication Sig Start Date End Date Taking? Authorizing Provider  aspirin  EC 81 MG tablet Take 81 mg by mouth daily. Swallow whole.    [provider]  busPIRone  (BUSPAR ) 7.5 MG tablet Take 7.5 mg by mouth daily. 03/01/23   [provider]  carbidopa -levodopa  (SINEMET  CR) 50-200 MG tablet Take 1 tablet by mouth at bedtime. 07/26/23 07/25/24  [provider]  carbidopa -levodopa  (SINEMET  IR) 25-100 MG tablet Take 1.5 tablets by mouth 3 (three) times daily. 01/25/20 08/28/23  Maree Jannett POUR, MD  cetirizine  (ZYRTEC ) 10 MG tablet Take 10 mg by mouth daily as needed for allergies.    [provider]  clindamycin (CLEOCIN) 150 MG capsule Take 150 mg by mouth 3 (three) times daily. Patient not taking: Reported on 08/28/2023 06/18/23   [provider]  colchicine  0.6 MG tablet TAKE 1 TABLET BY MOUTH ONCE DAILY Patient taking differently: Take 0.6 mg by mouth 2 (two) times daily as needed (gout flare). 07/12/23   Sowles, Krichna, MD  cyclobenzaprine  (FLEXERIL ) 10 MG tablet Take 10 mg by mouth at bedtime. 08/01/21   [provider]  DULoxetine  (CYMBALTA ) 60 MG capsule Take 1 capsule (60 mg total) by mouth daily. 05/01/23   Sowles, Krichna, MD  empagliflozin  (JARDIANCE ) 25 MG TABS tablet Take 1 tablet (25 mg total) by mouth daily before breakfast. 05/01/23   Sowles, Krichna, MD  fluticasone   (FLONASE ) 50 MCG/ACT nasal spray Place 2 sprays into both nostrils daily. 10/25/21   Sowles, Krichna, MD  furosemide  (LASIX ) 40 MG tablet Take 1 tablet (40 mg total) by mouth daily as needed. 06/21/17   [name], Almarie BRAVO, NP  gabapentin  (NEURONTIN ) 600 MG tablet Take 1,200 mg by mouth 3 (three) times daily.    [provider]  hydrOXYzine  (ATARAX ) 25 MG tablet Take 25 mg by mouth daily. 04/04/23   [provider]  ibuprofen (ADVIL) 600 MG tablet Take 1 tablet by mouth every 6 (six) hours as needed (Pain). 10/06/19   [provider]  insulin  glargine (LANTUS ) 100 UNIT/ML injection Inject 20-50 Units into the skin daily.    [provider]  ipratropium (ATROVENT ) 0.06 %  nasal spray Place 2 sprays into both nostrils 4 (four) times daily. 10/25/21   Sowles, Krichna, MD  ipratropium-albuterol  (DUONEB) 0.5-2.5 (3) MG/3ML SOLN Inhale 3 mLs into the lungs every 6 (six) hours as needed. 06/21/17   [name], Larena E, NP  levocetirizine (XYZAL ) 5 MG tablet Take 1 tablet (5 mg total) by mouth every evening. 05/01/23   Sowles, Krichna, MD  lidocaine  (LIDODERM ) 5 % 1 patch every 12 (twelve) hours as needed. Patient not taking: Reported on 08/29/2023 02/02/20   [provider]  lubiprostone  (AMITIZA ) 24 MCG capsule Take by mouth 2 (two) times daily as needed. Patient not taking: Reported on 08/29/2023 02/05/20   [provider]  Methylnaltrexone Bromide 150 MG TABS Take 1 tablet by mouth in the morning, at noon, and at bedtime.    [provider]  montelukast  (SINGULAIR ) 10 MG tablet Take 1 tablet by mouth at bedtime. 02/21/21   Theotis Lavelle BRAVO, MD  morphine  (MS CONTIN ) 30 MG 12 hr tablet Take 30 mg by mouth every 12 (twelve) hours.    [provider]  morphine  (MSIR) 15 MG tablet Take 15 mg by mouth every 6 (six) hours as needed for severe pain (pain score 7-10) or moderate pain (pain score 4-6).    [provider]  naproxen  (NAPROSYN ) 500  MG tablet Take 500 mg by mouth daily as needed for moderate pain (pain score 4-6) or mild pain (pain score 1-3) (pain).    [provider]  omeprazole  (PRILOSEC) 40 MG capsule Take 1 capsule (40 mg total) by mouth daily. 05/01/23   Sowles, Krichna, MD  ondansetron  (ZOFRAN ) 4 MG tablet Take 1 tablet (4 mg total) by mouth daily as needed for nausea or vomiting. Patient not taking: Reported on 08/29/2023 11/01/22   Sowles, Krichna, MD  predniSONE  (DELTASONE ) 10 MG tablet Take 1 tablet (10 mg total) by mouth 2 (two) times daily with a meal. Patient not taking: Reported on 08/28/2023 05/01/23   Sowles, Krichna, MD  QUEtiapine  (SEROQUEL ) 25 MG tablet Take 1 tablet (25 mg total) by mouth at bedtime. 05/01/23   Sowles, Krichna, MD  rizatriptan  (MAXALT -MLT) 10 MG disintegrating tablet Take by mouth. Patient not taking: Reported on 08/29/2023 09/25/21   [provider]  rosuvastatin  (CRESTOR ) 5 MG tablet Take 1 tablet (5 mg total) by mouth at bedtime. 05/01/23   Sowles, Krichna, MD  silver  sulfADIAZINE  (SILVADENE ) 1 % cream Apply 1 Application topically daily.    [provider]  SYMBICORT  160-4.5 MCG/ACT inhaler INHALE 2 PUFFS TWICE A DAY RINSE MOUTH WITH WATER AFTER EACH USE 07/09/22   Glenard, Krichna, MD  theophylline  (UNIPHYL) 400 MG 24 hr tablet Take 1 tablet by mouth daily.  12/27/17   Fleming, Herbon E, MD  tiotropium (SPIRIVA ) 18 MCG inhalation capsule Place 1 capsule into inhaler and inhale daily. pm 08/27/13   Theotis Lavelle BRAVO, MD  VENTOLIN  HFA 108 (90 Base) MCG/ACT inhaler INHALE 1 PUFF BY MOUTH AS NEEDED 04/26/20   Sowles, Krichna, MD  XTAMPZA  ER 13.5 MG C12A Take 13.5 mg by mouth every 8 (eight) hours. Patient not taking: Reported on 08/29/2023 04/12/23   [provider]    Physical Exam: Vitals:   09/22/23 2100 09/22/23 2130 09/22/23 2200 09/22/23 2230  BP: (!) 119/58 (!) 102/51 (!) 116/55 115/80  Pulse: (!) 101 98 94 (!) 105  Resp: 18 14 (!) 21 20  Temp:      TempSrc:       SpO2: 97%  97% 98% 97%  Weight:      Height:       Physical Exam Vitals and nursing note reviewed.  Constitutional:      General: She is not in acute distress.    Comments: Frail, chronically ill-appearing female, multiple bruises about body, healing laceration left eyebrow  HENT:     Head: Normocephalic and atraumatic.  Cardiovascular:     Rate and Rhythm: Regular rhythm. Tachycardia present.     Heart sounds: Normal heart sounds.  Pulmonary:     Effort: Pulmonary effort is normal.     Breath sounds: Normal breath sounds.  Abdominal:     Palpations: Abdomen is soft.     Tenderness: There is no abdominal tenderness.  Neurological:     Mental Status: Mental status is at baseline.     Labs on Admission: I have personally reviewed following labs and imaging studies  CBC: Recent Labs  Lab 09/22/23 2024  WBC 5.8  HGB 9.3*  HCT 28.6*  MCV 100.7*  PLT 328   Basic Metabolic Panel: Recent Labs  Lab 09/18/23 0504 09/22/23 2024  NA  --  137  K  --  3.0*  CL  --  106  CO2  --  22  GLUCOSE  --  91  BUN  --  22  CREATININE 1.01* 0.92  CALCIUM   --  7.5*   GFR: Estimated Creatinine Clearance: 63.1 mL/min (by C-G formula based on SCr of 0.92 mg/dL). Liver Function Tests: Recent Labs  Lab 09/22/23 2024  AST 44*  ALT 6  ALKPHOS 95  BILITOT 1.1  PROT 5.5*  ALBUMIN 2.2*   No results for input(s): LIPASE, AMYLASE in the last 168 hours. No results for input(s): AMMONIA in the last 168 hours. Coagulation Profile: Recent Labs  Lab 09/22/23 2024  INR 1.4*   Cardiac Enzymes: No results for input(s): CKTOTAL, CKMB, CKMBINDEX, TROPONINI in the last 168 hours. BNP (last 3 results) No results for input(s): PROBNP in the last 8760 hours. HbA1C: No results for input(s): HGBA1C in the last 72 hours. CBG: Recent Labs  Lab 09/16/23 1143  GLUCAP 117*   Lipid Profile: No results for input(s): CHOL, HDL, LDLCALC, TRIG, CHOLHDL,  LDLDIRECT in the last 72 hours. Thyroid  Function Tests: No results for input(s): TSH, T4TOTAL, FREET4, T3FREE, THYROIDAB in the last 72 hours. Anemia Panel: No results for input(s): VITAMINB12, FOLATE, FERRITIN, TIBC, IRON, RETICCTPCT in the last 72 hours. Urine analysis:    Component Value Date/Time   COLORURINE AMBER (A) 09/15/2023 1215   APPEARANCEUR CLOUDY (A) 09/15/2023 1215   APPEARANCEUR Cloudy (A) 01/24/2015 0000   LABSPEC 1.015 09/15/2023 1215   LABSPEC 1.005 11/06/2012 2335   PHURINE 5.0 09/15/2023 1215   GLUCOSEU NEGATIVE 09/15/2023 1215   GLUCOSEU Negative 11/06/2012 2335   HGBUR NEGATIVE 09/15/2023 1215   BILIRUBINUR NEGATIVE 09/15/2023 1215   BILIRUBINUR Small 10/25/2021 1326   BILIRUBINUR Negative 01/24/2015 0000   BILIRUBINUR Negative 11/06/2012 2335   KETONESUR 5 (A) 09/15/2023 1215   PROTEINUR 100 (A) 09/15/2023 1215   UROBILINOGEN 0.2 10/25/2021 1326   NITRITE NEGATIVE 09/15/2023 1215   LEUKOCYTESUR LARGE (A) 09/15/2023 1215   LEUKOCYTESUR 3+ 11/06/2012 2335    Radiological Exams on Admission: DG Chest Port 1 View Result Date: 09/22/2023 EXAM: 1 VIEW XRAY OF THE CHEST 09/22/2023 08:55:28 PM COMPARISON: 09/08/2021 and 08/22/2022. CLINICAL HISTORY: Questionable sepsis - evaluate for abnormality. PER ER NOTE; Patient brought in via [place] [facility] today from home with  complaints of fall and altered mental status. Patient fell down around 7am this morning and has been in the floor since then as she was unable to get herself up off the floor. Husband and son ; in law waited to call 911 until tonight. Presents with lac to forehead which she states happened days ago. EMS also reports she has bed sore on her back that appears infected. FINDINGS: LUNGS AND PLEURA: Patchy airspace opacities in the right mid and lower lung may be due to atelectasis or infiltrates. Consider CT for further evaluation. No pleural effusion or pneumothorax. HEART AND MEDIASTINUM:  Stable cardiomediastinal silhouette. BONES AND SOFT TISSUES: No acute osseous abnormality. IMPRESSION: 1. Patchy airspace opacities in the right mid and lower lung, possibly due to atelectasis or infiltrates. Consider CT for further evaluation. 2. No pleural effusion or pneumothorax. Electronically signed by: Norman Gatlin MD 09/22/2023 09:09 PM EDT RP Workstation: [id number]   Data Reviewed for HPI: Relevant notes from primary care and specialist visits, past discharge summaries as available in EHR, including Care Everywhere. Prior diagnostic testing as pertinent to current admission diagnoses Updated medications and problem lists for reconciliation ED course, including vitals, labs, imaging, treatment and response to treatment Triage notes, nursing and pharmacy notes and ED provider's notes Notable results as noted above in HPI      Assessment and Plan: CAP (community acquired pneumonia) SIRS, possibly sepsis History of MAC s/p treatment April 2025 COPD with chronic respiratory failure on home O2 at 2 L Tobacco use disorder SIRS criteria include low-grade temp and tachycardia with tachypnea but WBC and lactic acid normal Chest x-ray showing patchy infiltrates We will get a procalcitonin Rocephin  and azithromycin  Continue inhalers Antitussives, flutter valve and incentive spirometer Nicotine patch CT chest ordered for further evaluation as recommended by radiologist  History of Klebsiella UTI 09/11/2023 History of urinary retention requiring Foley 08/2023 Follow up UA Bladder scan prn Covered with Rocephin  for pneumonia  Physical deconditioning History of DSS referral July 2025 TOC and PT eval  Frequent falls History of recurrent syncope History of hypotension/orthostatic hypotension requiring midodrine  Possible polypharmacy Received an IV fluid bolus in the ED Will get orthostatics every shift in view of history of Parkinson's EF 60 to 65%, G1 DD 08/30/2023 Continuous  cardiac monitoring Neurologic checks with fall and aspiration precautions Midodrine  if needed Minimize sedating meds if possible(BuSpar , hydroxyzine , cyclobenzaprine , gabapentin , long and short acting opiates, Seroquel ) PT and TOC consult  Chronic pain Chronic prescription opioid use Continue Xtampza  and MS IR pending verification Continue Cymbalta   Hypoalbuminemia due to protein-calorie malnutrition (HCC) Dietary consult  Chronic diastolic heart failure (HCC) Clinically euvolemic to dry EF 60 to 65%, G1 DD 08/30/2023 Continue Jardiance .  Will hold furosemide  tonight  Diabetes mellitus type 2, insulin  dependent (HCC) Sliding scale insulin  coverage  GAD (generalized anxiety disorder) Holding buspirone  and hydroxyzine  tonight.  Continue duloxetine  and Seroquel   Parkinson's disease (HCC) Continue Sinemet  Will hold gabapentin  for tonight  History of urinary retention Bladder scan as needed Has required Foley during hospital stay from 7/9 - 09/18/23    DVT prophylaxis: Lovenox   Consults: none  Advance Care Planning:   Code Status: Prior   Family Communication: none  Disposition Plan: Back to previous home environment  Severity of Illness: The appropriate patient status for this patient is OBSERVATION. Observation status is judged to be reasonable and necessary in order to provide the required intensity of service to ensure the patient's safety. [name] presenting symptoms, physical exam findings, and  initial radiographic and laboratory data in the context of their medical condition is felt to place them at decreased risk for further clinical deterioration. Furthermore, it is anticipated that the patient will be medically stable for discharge from the hospital within 2 midnights of admission.   Author: Delayne LULLA Solian, MD 09/22/2023 11:05 PM  For on call review [email or web address].

## 2023-09-22 NOTE — Assessment & Plan Note (Signed)
 Dietary consult

## 2023-09-22 NOTE — ED Notes (Signed)
 APS report made with Vernell due to patient condition and EMS report.

## 2023-09-22 NOTE — Assessment & Plan Note (Signed)
 Sliding scale insulin coverage

## 2023-09-23 ENCOUNTER — Encounter: Payer: Self-pay | Admitting: Internal Medicine

## 2023-09-23 DIAGNOSIS — L89312 Pressure ulcer of right buttock, stage 2: Secondary | ICD-10-CM | POA: Diagnosis present

## 2023-09-23 DIAGNOSIS — L8990 Pressure ulcer of unspecified site, unspecified stage: Secondary | ICD-10-CM | POA: Insufficient documentation

## 2023-09-23 DIAGNOSIS — R197 Diarrhea, unspecified: Secondary | ICD-10-CM | POA: Diagnosis present

## 2023-09-23 DIAGNOSIS — R5381 Other malaise: Secondary | ICD-10-CM

## 2023-09-23 DIAGNOSIS — E876 Hypokalemia: Secondary | ICD-10-CM | POA: Diagnosis present

## 2023-09-23 DIAGNOSIS — E43 Unspecified severe protein-calorie malnutrition: Secondary | ICD-10-CM | POA: Diagnosis present

## 2023-09-23 DIAGNOSIS — R296 Repeated falls: Secondary | ICD-10-CM | POA: Diagnosis present

## 2023-09-23 DIAGNOSIS — H9192 Unspecified hearing loss, left ear: Secondary | ICD-10-CM | POA: Diagnosis present

## 2023-09-23 DIAGNOSIS — G8929 Other chronic pain: Secondary | ICD-10-CM | POA: Diagnosis present

## 2023-09-23 DIAGNOSIS — Z79891 Long term (current) use of opiate analgesic: Secondary | ICD-10-CM | POA: Diagnosis not present

## 2023-09-23 DIAGNOSIS — Z9981 Dependence on supplemental oxygen: Secondary | ICD-10-CM | POA: Diagnosis not present

## 2023-09-23 DIAGNOSIS — E119 Type 2 diabetes mellitus without complications: Secondary | ICD-10-CM | POA: Diagnosis present

## 2023-09-23 DIAGNOSIS — E8809 Other disorders of plasma-protein metabolism, not elsewhere classified: Secondary | ICD-10-CM | POA: Diagnosis present

## 2023-09-23 DIAGNOSIS — W19XXXA Unspecified fall, initial encounter: Secondary | ICD-10-CM | POA: Diagnosis present

## 2023-09-23 DIAGNOSIS — J9611 Chronic respiratory failure with hypoxia: Secondary | ICD-10-CM | POA: Diagnosis present

## 2023-09-23 DIAGNOSIS — R4182 Altered mental status, unspecified: Secondary | ICD-10-CM | POA: Diagnosis present

## 2023-09-23 DIAGNOSIS — Z794 Long term (current) use of insulin: Secondary | ICD-10-CM | POA: Diagnosis not present

## 2023-09-23 DIAGNOSIS — F411 Generalized anxiety disorder: Secondary | ICD-10-CM | POA: Diagnosis present

## 2023-09-23 DIAGNOSIS — A419 Sepsis, unspecified organism: Secondary | ICD-10-CM | POA: Diagnosis present

## 2023-09-23 DIAGNOSIS — I5032 Chronic diastolic (congestive) heart failure: Secondary | ICD-10-CM | POA: Diagnosis present

## 2023-09-23 DIAGNOSIS — I110 Hypertensive heart disease with heart failure: Secondary | ICD-10-CM | POA: Diagnosis present

## 2023-09-23 DIAGNOSIS — Z1152 Encounter for screening for COVID-19: Secondary | ICD-10-CM | POA: Diagnosis not present

## 2023-09-23 DIAGNOSIS — G9341 Metabolic encephalopathy: Secondary | ICD-10-CM | POA: Diagnosis present

## 2023-09-23 DIAGNOSIS — I951 Orthostatic hypotension: Secondary | ICD-10-CM | POA: Diagnosis present

## 2023-09-23 DIAGNOSIS — R6510 Systemic inflammatory response syndrome (SIRS) of non-infectious origin without acute organ dysfunction: Secondary | ICD-10-CM | POA: Diagnosis not present

## 2023-09-23 DIAGNOSIS — F17210 Nicotine dependence, cigarettes, uncomplicated: Secondary | ICD-10-CM | POA: Diagnosis present

## 2023-09-23 DIAGNOSIS — Y92009 Unspecified place in unspecified non-institutional (private) residence as the place of occurrence of the external cause: Secondary | ICD-10-CM | POA: Diagnosis not present

## 2023-09-23 DIAGNOSIS — A0472 Enterocolitis due to Clostridium difficile, not specified as recurrent: Secondary | ICD-10-CM | POA: Diagnosis present

## 2023-09-23 DIAGNOSIS — J449 Chronic obstructive pulmonary disease, unspecified: Secondary | ICD-10-CM | POA: Diagnosis present

## 2023-09-23 DIAGNOSIS — G20A1 Parkinson's disease without dyskinesia, without mention of fluctuations: Secondary | ICD-10-CM | POA: Diagnosis present

## 2023-09-23 LAB — CK: Total CK: 474 U/L — ABNORMAL HIGH (ref 38–234)

## 2023-09-23 LAB — MAGNESIUM: Magnesium: 1.8 mg/dL (ref 1.7–2.4)

## 2023-09-23 LAB — URINALYSIS, W/ REFLEX TO CULTURE (INFECTION SUSPECTED)
Bilirubin Urine: NEGATIVE
Glucose, UA: NEGATIVE mg/dL
Ketones, ur: NEGATIVE mg/dL
Nitrite: NEGATIVE
Protein, ur: 100 mg/dL — AB
Specific Gravity, Urine: 1.012 (ref 1.005–1.030)
WBC, UA: >50 WBC/hpf (ref 0–5)
pH: 7 (ref 5.0–8.0)

## 2023-09-23 LAB — CLOSTRIDIUM DIFFICILE BY PCR, REFLEXED
Hypervirulent Strain: NEGATIVE
Toxigenic C. Difficile by PCR: NEGATIVE

## 2023-09-23 LAB — CBC
HCT: 28.8 % — ABNORMAL LOW (ref 36.0–46.0)
Hemoglobin: 9.4 g/dL — ABNORMAL LOW (ref 12.0–15.0)
MCH: 32.6 pg (ref 26.0–34.0)
MCHC: 32.6 g/dL (ref 30.0–36.0)
MCV: 100 fL (ref 80.0–100.0)
Platelets: 312 K/uL (ref 150–400)
RBC: 2.88 MIL/uL — ABNORMAL LOW (ref 3.87–5.11)
RDW: 16.7 % — ABNORMAL HIGH (ref 11.5–15.5)
WBC: 4.9 K/uL (ref 4.0–10.5)
nRBC: 0 % (ref 0.0–0.2)

## 2023-09-23 LAB — COMPREHENSIVE METABOLIC PANEL WITH GFR
ALT: 9 U/L (ref 0–44)
AST: 42 U/L — ABNORMAL HIGH (ref 15–41)
Albumin: 1.9 g/dL — ABNORMAL LOW (ref 3.5–5.0)
Alkaline Phosphatase: 90 U/L (ref 38–126)
Anion gap: 6 (ref 5–15)
BUN: 22 mg/dL (ref 8–23)
CO2: 22 mmol/L (ref 22–32)
Calcium: 7.2 mg/dL — ABNORMAL LOW (ref 8.9–10.3)
Chloride: 104 mmol/L (ref 98–111)
Creatinine, Ser: 1.17 mg/dL — ABNORMAL HIGH (ref 0.44–1.00)
GFR, Estimated: 52 mL/min — ABNORMAL LOW (ref >60)
Glucose, Bld: 107 mg/dL — ABNORMAL HIGH (ref 70–99)
Potassium: 3 mmol/L — ABNORMAL LOW (ref 3.5–5.1)
Sodium: 132 mmol/L — ABNORMAL LOW (ref 135–145)
Total Bilirubin: 0.9 mg/dL (ref 0.0–1.2)
Total Protein: 4.9 g/dL — ABNORMAL LOW (ref 6.5–8.1)

## 2023-09-23 LAB — GLUCOSE, CAPILLARY
Glucose-Capillary: 109 mg/dL — ABNORMAL HIGH (ref 70–99)
Glucose-Capillary: 145 mg/dL — ABNORMAL HIGH (ref 70–99)
Glucose-Capillary: 216 mg/dL — ABNORMAL HIGH (ref 70–99)
Glucose-Capillary: 68 mg/dL — ABNORMAL LOW (ref 70–99)
Glucose-Capillary: 70 mg/dL (ref 70–99)
Glucose-Capillary: 75 mg/dL (ref 70–99)

## 2023-09-23 LAB — C DIFFICILE QUICK SCREEN W PCR REFLEX
C Diff antigen: POSITIVE — AB
C Diff toxin: NEGATIVE

## 2023-09-23 LAB — POTASSIUM: Potassium: 3.4 mmol/L — ABNORMAL LOW (ref 3.5–5.1)

## 2023-09-23 LAB — PROCALCITONIN: Procalcitonin: 0.61 ng/mL

## 2023-09-23 MED ORDER — POTASSIUM CHLORIDE 10 MEQ/100ML IV SOLN
10.0000 meq | INTRAVENOUS | Status: AC
  Administered 2023-09-23 (×5): 10 meq via INTRAVENOUS
  Filled 2023-09-23 (×4): qty 100

## 2023-09-23 MED ORDER — VITAMIN C 500 MG PO TABS
500.0000 mg | ORAL_TABLET | Freq: Two times a day (BID) | ORAL | Status: DC
  Administered 2023-09-23 – 2023-09-30 (×16): 500 mg via ORAL
  Filled 2023-09-23 (×15): qty 1

## 2023-09-23 MED ORDER — ADULT MULTIVITAMIN W/MINERALS CH
1.0000 | ORAL_TABLET | Freq: Every day | ORAL | Status: DC
  Administered 2023-09-23 – 2023-09-30 (×9): 1 via ORAL
  Filled 2023-09-23 (×8): qty 1

## 2023-09-23 MED ORDER — COLLAGENASE 250 UNIT/GM EX OINT: TOPICAL_OINTMENT | Freq: Every day | CUTANEOUS | Status: DC

## 2023-09-23 MED ORDER — ZINC SULFATE 220 (50 ZN) MG PO CAPS
220.0000 mg | ORAL_CAPSULE | Freq: Every day | ORAL | Status: DC
  Administered 2023-09-23 – 2023-09-30 (×9): 220 mg via ORAL
  Filled 2023-09-23 (×8): qty 1

## 2023-09-23 MED ORDER — DEXTROSE 50 % IV SOLN
50.0000 mL | Freq: Once | INTRAVENOUS | Status: AC
  Administered 2023-09-23: 50 mL via INTRAVENOUS
  Filled 2023-09-23: qty 50

## 2023-09-23 MED ORDER — POTASSIUM CHLORIDE CRYS ER 20 MEQ PO TBCR
40.0000 meq | EXTENDED_RELEASE_TABLET | ORAL | Status: AC
  Administered 2023-09-23 (×2): 40 meq via ORAL
  Filled 2023-09-23 (×2): qty 2

## 2023-09-23 MED ORDER — LACTATED RINGERS IV SOLN: INTRAVENOUS | Status: AC

## 2023-09-23 MED ORDER — MIDODRINE HCL 5 MG PO TABS
5.0000 mg | ORAL_TABLET | Freq: Three times a day (TID) | ORAL | Status: DC
  Administered 2023-09-24 – 2023-09-30 (×22): 5 mg via ORAL
  Filled 2023-09-23 (×20): qty 1

## 2023-09-23 MED ORDER — MEDIHONEY WOUND/BURN DRESSING EX PSTE
1.0000 | PASTE | Freq: Every day | CUTANEOUS | Status: DC
  Administered 2023-09-23 – 2023-09-30 (×9): 1 via TOPICAL
  Filled 2023-09-23 (×2): qty 44

## 2023-09-23 MED ORDER — VANCOMYCIN HCL 125 MG PO CAPS
125.0000 mg | ORAL_CAPSULE | Freq: Four times a day (QID) | ORAL | Status: DC
  Administered 2023-09-23 – 2023-09-30 (×28): 125 mg via ORAL
  Filled 2023-09-23 (×29): qty 1

## 2023-09-23 MED ORDER — CHLORHEXIDINE GLUCONATE CLOTH 2 % EX PADS
6.0000 | MEDICATED_PAD | Freq: Every day | CUTANEOUS | Status: AC
  Administered 2023-09-23 – 2023-09-27 (×4): 6 via TOPICAL

## 2023-09-23 MED ORDER — MUPIROCIN 2 % EX OINT
1.0000 | TOPICAL_OINTMENT | Freq: Two times a day (BID) | CUTANEOUS | Status: AC
  Administered 2023-09-23 – 2023-09-27 (×10): 1 via NASAL
  Filled 2023-09-23: qty 22

## 2023-09-23 MED ORDER — MAGNESIUM SULFATE 2 GM/50ML IV SOLN
2.0000 g | Freq: Once | INTRAVENOUS | Status: AC
  Administered 2023-09-23: 2 g via INTRAVENOUS
  Filled 2023-09-23: qty 50

## 2023-09-23 NOTE — Assessment & Plan Note (Signed)
 History of DSS referral July 2025 TOC and PT eval

## 2023-09-23 NOTE — Progress Notes (Signed)
 PHARMACY CONSULT NOTE - ELECTROLYTES  Pharmacy Consult for Electrolyte Monitoring and Replacement   Recent Labs: Height: 5' 8 (172.7 cm) Weight: 67.1 kg (147 lb 14.9 oz) IBW/kg (Calculated) : 63.9 Estimated Creatinine Clearance: 49.6 mL/min (A) (by C-G formula based on SCr of 1.17 mg/dL (H)). Potassium (mmol/L)  Date Value  09/23/2023 3.4 (L)  11/16/2013 4.0   Magnesium  (mg/dL)  Date Value  91/95/7974 1.8   Calcium  (mg/dL)  Date Value  91/95/7974 7.2 (L)   Calcium , Total (mg/dL)  Date Value  90/71/7984 7.9 (L)   Albumin (g/dL)  Date Value  91/95/7974 1.9 (L)  07/06/2015 4.2  10/07/2012 2.9 (L)   Phosphorus (mg/dL)  Date Value  92/75/7974 3.5   Sodium (mmol/L)  Date Value  09/23/2023 132 (L)  07/06/2015 146 (H)  11/16/2013 131 (L)   Corrected Ca: 8.9 mg/dL  Assessment  Tricia [name] is a 63 y.o. female presenting after mechanical fall at home with unknown downtime. PMH significant for HFpEF(EF 60 to 65%, G1 DD 08/30/2023), type II IDDM, HTN, GAD, Parkinson's, chronic pain on chronic opiates, tobacco use disorder COPD, stage II on home O2 at 2 L. Pharmacy has been consulted to monitor and replace electrolytes.  Diet: Regular, carb modified MIVF: LR @ 150 mL/hr Pertinent medications: N/A  Goal of Therapy: Electrolytes WNL  Plan:  Mg 1.8: Give magnesium  2g IV x1 K 3.4 s/p KCL 10 mEq IV x5 this AM Provider has ordered an additional 40 mEq PO x2 this afternoon Check BMP, Mg, Phos with AM labs  Thank you for allowing pharmacy to be a part of this patient's care.  Damien Napoleon, PharmD Clinical Pharmacist 09/23/2023 12:21 PM

## 2023-09-23 NOTE — Evaluation (Signed)
 Occupational Therapy Evaluation Patient Details Name: Tricia [name] MRN: [id number] DOB: 02-Aug-1960 Today's Date: 09/23/2023   History of Present Illness   Pt is a 63 y/o female admitted secondary to a fall and AMS. Pt was found down after several hours. Pt found to have CAP, SIRS and ?sepsis. Patient's recent hospital stay was complicated by urinary retention requiring Foley and a DSS referral due to concerns for inadequate care at home.  She also had episodes of hypotension treated with midodrine . 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     Clinical Impressions Patient presenting with decreased Ind in self care,balance, functional mobility/transfers, endurance, and safety awareness.Patient reports living at home with sister and son in law. She is oriented to name only. When asked about home set up and bathing she states,  I take a bath when I get home from school every day. She is very confused and no family present to confirm baseline. Pt then reports she is having BM and rolls with max A and bed pan placed under her as she is already voiding. NT notified of pt being on bed pan as she likely will not use call bell when finished. Patient will benefit from acute OT to increase overall independence in the areas of ADLs, functional mobility, and safety awareness in order to safely discharge.     If plan is discharge home, recommend the following:   A little help with walking and/or transfers;A lot of help with bathing/dressing/bathroom;Direct supervision/assist for medications management;Supervision due to cognitive status;Direct  supervision/assist for financial management;Assistance with cooking/housework;Assist for transportation;Help with stairs or ramp for entrance     Functional Status Assessment   Patient has had a recent decline in their functional status and demonstrates the ability to make significant improvements in function in a reasonable and predictable amount of time.     Equipment Recommendations   None recommended by OT      Precautions/Restrictions   Precautions Precautions: Fall Recall of Precautions/Restrictions: Impaired     Mobility Bed Mobility Overal bed mobility: Needs Assistance Bed Mobility: Rolling Rolling: Max assist              Transfers                              ADL either performed or assessed with clinical judgement   ADL Overall ADL's : Needs assistance/impaired     Grooming: Wash/dry face;Wash/dry hands;Bed level;Supervision/safety;Set up;Cueing for sequencing                                 General ADL Comments: max A to roll and place on bed pan for BM     Vision Baseline Vision/History: 1 Wears glasses Patient Visual Report: No change from baseline              Pertinent Vitals/Pain Pain Assessment Pain Assessment: Faces Faces Pain Scale: Hurts little more     Extremity/Trunk Assessment Upper Extremity Assessment Upper Extremity Assessment: Generalized weakness   Lower Extremity Assessment Lower Extremity Assessment: Generalized weakness  Communication Communication Communication: No apparent difficulties   Cognition Arousal: Alert Behavior During Therapy: WFL for tasks assessed/performed Cognition: No family/caregiver present to determine baseline             OT - Cognition Comments: Pt is oriented to self only.                 Following commands: Impaired Following commands impaired: Follows one step commands inconsistently     Cueing  General Comments   Cueing  Techniques: Verbal cues;Visual cues;Gestural cues;Tactile cues              Home Living Family/patient expects to be discharged to:: Private residence Living Arrangements: Other relatives (son in law and sister) Available Help at Discharge: Available PRN/intermittently Type of Home: House Home Access: Stairs to enter [employer] of Steps: 4 Entrance Stairs-Rails: Left;Right Home Layout: One level     Bathroom Shower/Tub: Tub/shower unit;Walk-in shower         Home Equipment: Agricultural consultant (2 wheels);Shower seat - built in          Prior Functioning/Environment Prior Level of Function : History of Falls (last six months);Patient poor historian/Family not available             Mobility Comments: Pt only admitting to having 2 falls in the past 6 months, despite her telling another PT during a previous recent admission that she has fallen 20-25 times this year. Pt reporting that she still drives and grocery shops by herself ADLs Comments: Pt. reports independence with ADLs, and IADLs    OT Problem List: Decreased strength;Decreased cognition;Impaired balance (sitting and/or standing);Decreased activity tolerance;Pain   OT Treatment/Interventions: Self-care/ADL training;Therapeutic exercise;DME and/or AE instruction;Patient/family education;Therapeutic activities;Cognitive remediation/compensation;Energy conservation      OT Goals(Current goals can be found in the care plan section)   Acute Rehab OT Goals Patient Stated Goal: to go home OT Goal Formulation: With patient Time For Goal Achievement: 10/07/23 Potential to Achieve Goals: Good ADL Goals Pt Will Perform Grooming: with supervision;sitting Pt Will Perform Lower Body Dressing: with min assist;sit to/from stand Pt Will Transfer to Toilet: with min assist;ambulating Pt Will Perform Toileting - Clothing Manipulation and hygiene: with min assist;sit to/from stand   OT Frequency:  Min 2X/week        AM-PAC OT 6 Clicks Daily Activity     Outcome Measure Help from another person eating meals?: None Help from another person taking care of personal grooming?: None Help from another person toileting, which includes using toliet, bedpan, or urinal?: A Little Help from another person bathing (including washing, rinsing, drying)?: A Lot Help from another person to put on and taking off regular upper body clothing?: A Little Help from another person to put on and taking off regular lower body clothing?: A Little 6 Click Score: 19   End of Session Equipment Utilized During Treatment: Rolling walker (2 wheels) Nurse Communication: Mobility status  Activity Tolerance: Patient tolerated treatment well Patient left: in bed;with call bell/phone within reach;with bed alarm set  OT Visit Diagnosis: Unsteadiness on feet (R26.81);Muscle weakness (generalized) (M62.81);History of falling (Z91.81)                Time: 1020-1039 OT Time Calculation (min): 19 min Charges:  OT General Charges $OT Visit: 1 Visit OT Evaluation $OT Eval Low Complexity: 1 Low OT Treatments $Self Care/Home Management : 8-22 mins  Tricia Claude, MS, OTR/L , CBIS ascom [phone]  09/23/23, 1:34 PM

## 2023-09-23 NOTE — TOC Initial Note (Signed)
 Transition of Care [facility]) - Initial/Assessment Note    Patient Details  Name: Tricia [name] MRN: [id number] Date of Birth: 10-10-1960  Transition of Care [facility]) CM/SW Contact:    Dalia GORMAN Fuse, RN Phone Number: 09/23/2023, 10:10 AM  Clinical Narrative:                  Patient with a recent hospital discharge on 09/18/2023. The patient is from home with her son in law, Ozell. She presented after a fall and on workup found to be febrile and tachycardic. Patient remains inpt on IV abx. Therapy recs are pending. TOC will continue to follow.  Expected Discharge Plan: Skilled Nursing Facility Barriers to Discharge: Continued Medical Work up   Patient Goals and CMS Choice            Expected Discharge Plan and Services   Discharge Planning Services: CM Consult   Living arrangements for the past 2 months: Single Family Home                                      Prior Living Arrangements/Services Living arrangements for the past 2 months: Single Family Home Lives with:: Other (Comment) (Son in Social worker)              Current home services: DME (Walker, bedside commode, powered wheelchair, stand up walker)    Activities of Daily Living   ADL Screening (condition at time of admission) Independently performs ADLs?: No Does the patient have a NEW difficulty with bathing/dressing/toileting/self-feeding that is expected to last >3 days?: Yes (Initiates electronic notice to provider for possible OT consult) Does the patient have a NEW difficulty with getting in/out of bed, walking, or climbing stairs that is expected to last >3 days?: Yes (Initiates electronic notice to provider for possible PT consult) Does the patient have a NEW difficulty with communication that is expected to last >3 days?: No Is the patient deaf or have difficulty hearing?: Yes Does the patient have difficulty seeing, even when wearing glasses/contacts?: No Does the patient have difficulty  concentrating, remembering, or making decisions?: Yes  Permission Sought/Granted                  Emotional Assessment Appearance:: Appears older than stated age Attitude/Demeanor/Rapport: Engaged   Orientation: : Oriented to Self, Oriented to Place, Oriented to  Time   Psych Involvement: No (comment)  Admission diagnosis:  SIRS (systemic inflammatory response syndrome) (HCC) [R65.10] Patient Active Problem List   Diagnosis Date Noted   Physical deconditioning 09/23/2023   Chronic respiratory failure with hypoxia (HCC) 09/22/2023   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/22/2023   SIRS (systemic inflammatory response syndrome) (HCC) 09/22/2023   Chronic prescription opiate use 09/22/2023   Frequent falls 09/22/2023   Protein-calorie malnutrition, severe 09/17/2023   Malnutrition of moderate degree 09/02/2023   Syncope, vasovagal 08/29/2023   Parkinson disease (HCC) 08/29/2023   Anxiety and depression 08/29/2023   Chronic obstructive pulmonary disease (COPD) (HCC) 08/29/2023   GERD without esophagitis 08/29/2023   Elevated CK 08/29/2023   Type 2 diabetes mellitus with stage 3 chronic kidney disease (HCC) 08/29/2023   Acute kidney injury superimposed on chronic kidney disease (HCC) 08/28/2023   Sacral wound 02/21/2022   Pressure injury of sacral region, stage 1 02/21/2022   CAP (community acquired pneumonia) 09/08/2021   Nausea 09/08/2021   Hypoalbuminemia due to protein-calorie malnutrition (HCC)  09/08/2021   Lumbar herniated disc 07/11/2021   Calculus of gallbladder without cholecystitis without obstruction 07/11/2021   Senile purpura (HCC) 06/05/2021   Atherosclerosis of aorta (HCC) 06/05/2021   Parkinson's disease (HCC) 06/05/2021   Chronic kidney disease (CKD) stage G3a/A2, moderately decreased glomerular filtration rate (GFR) between 45-59 mL/min/1.73 square meter and albuminuria creatinine ratio between 30-299 mg/g (HCC) 06/05/2021   Esophageal  dysphagia    Gastric erythema    Columnar-lined esophagus    Moderate malnutrition (HCC) 09/26/2020   Centrilobular emphysema (HCC) 03/30/2020   History of urinary retention 11/18/2019   GAD (generalized anxiety disorder) 10/13/2019   MDD (major depressive disorder), recurrent episode, moderate (HCC) 10/13/2019   At risk for long QT syndrome 10/13/2019   Nonrheumatic mitral valve regurgitation 05/04/2019   Benign neoplasm of descending colon    Polyp of sigmoid colon    History of Klebsiella UTI 09/11/2023 01/24/2015   Polyneuropathy 10/21/2014   Chronic venous insufficiency 10/05/2014   Bilateral leg edema 08/16/2014   Major depression in partial remission (HCC) 08/16/2014   Acid reflux 08/16/2014   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   Asthma, moderate persistent 08/16/2014   Carpal tunnel syndrome 08/16/2014   Cervical pain 08/16/2014   CAFL (chronic airflow limitation) (HCC) 08/16/2014   Type 2 diabetes mellitus with peripheral neuropathy (HCC) 08/16/2014   Diabetes mellitus type 2, insulin  dependent (HCC) 08/16/2014   Dyslipidemia 08/16/2014   Tobacco use disorder 08/16/2014   Benign neoplasm of stomach 08/16/2014   Gout 08/16/2014   Mixed hyperlipidemia 08/16/2014   Low back pain 08/16/2014   Lumbar radiculopathy 08/16/2014   Headache, migraine 08/16/2014   Arthralgia of multiple joints 08/16/2014   Vitamin D  deficiency 08/16/2014   Primary osteoarthritis of both knees 06/22/2014   Benign essential tremor 10/02/2013   Cervical dystonia 10/02/2013   Chronic diastolic heart failure (HCC) 11/16/2012   Chronic pain 11/13/2012   PCP:  Glenard Mire, MD Pharmacy:   [facility] [place] [place] - [address] [place] [address] Phone: [phone] Fax: [phone]  [facility]) - [place], [place] - [address] [facility] [address] [place] [address] Phone: [phone] Fax:  [phone]  [facility] - [place], [place] - [address] [place] [address] Phone: [phone] Fax: [phone]     Social Drivers of Health (SDOH) Social History: SDOH Screenings   Food Insecurity: No Food Insecurity (09/22/2023)  Housing: Low Risk  (09/22/2023)  Transportation Needs: No Transportation Needs (09/22/2023)  Utilities: Not At Risk (09/22/2023)  Alcohol Screen: Low Risk  (03/07/2023)  Depression (PHQ2-9): High Risk (05/01/2023)  Financial Resource Strain: Patient Declined (06/18/2023)   Received from [facility]  Physical Activity: Sufficiently Active (03/07/2023)  Social Connections: Socially Isolated (08/29/2023)  Stress: No Stress Concern Present (03/07/2023)  Tobacco Use: High Risk (08/28/2023)  Health Literacy: Adequate Health Literacy (03/07/2023)   SDOH Interventions:     Readmission Risk Interventions     No data to display

## 2023-09-23 NOTE — Progress Notes (Signed)
 When pt arrived to unit, this RN, along with the Charge RN, did the pt's admission assessment.  Upon removing soiled linen from under pt and conducting a skin assessment, skin was dry and flaky, as well as pt found to be covered in dog hair, to the point it was in her private parts.  Also, the pt had dry, caked on stool on her lower extremities.  Pt also covered in scabs and scrapes, with a laceration to her left eyebrow.  Pt's bilateral distal lower extremities are discolored.

## 2023-09-23 NOTE — Assessment & Plan Note (Addendum)
 History of urinary retention requiring Foley 08/2023 Follow up UA Bladder scan prn Covered with Rocephin  for pneumonia

## 2023-09-23 NOTE — Progress Notes (Signed)
 MEWS Progress Note  Patient Details Name: Tricia [name] MRN: [id number] DOB: [birth year] Today's Date: 09/23/2023   MEWS Flowsheet Documentation:  Assess: MEWS Score Temp: (!) 101.1 F (38.4 C) BP: 124/67 MAP (mmHg): 84 Pulse Rate: (!) 105 ECG Heart Rate: (!) 106 Resp: 19 Level of Consciousness: Alert SpO2: 97 % O2 Device: Nasal Cannula O2 Flow Rate (L/min): 3 L/min Assess: MEWS Score MEWS Temp: 1 MEWS Systolic: 0 MEWS Pulse: 1 MEWS RR: 0 MEWS LOC: 0 MEWS Score: 2 MEWS Score Color: Yellow Assess: SIRS CRITERIA SIRS Temperature : 1 SIRS Respirations : 0 SIRS Pulse: 1 SIRS WBC: 0 SIRS Score Sum : 2 Assess: if the MEWS score is Yellow or Red Were vital signs accurate and taken at a resting state?: Yes Does the patient meet 2 or more of the SIRS criteria?: Yes Does the patient have a confirmed or suspected source of infection?: Yes        Lamar Satterfield 09/23/2023, 4:00 AM

## 2023-09-23 NOTE — Consult Note (Signed)
 WOC Nurse Consult Note: Reason for Consult: sacral wound CHF, type 2 diabetes, generalized anxiety disorder, Parkinson's disease, hypertension, and hyperlipidemia who presents via EMS after a fall at home today and altered mental status.   Wound type: Unstageable Pressure Injury: sacrum; 100% yellow fibrinous Stage 2; x 2 right buttock; partial thickness Stage 1 vs ICD; left buttock scattered; redness  Pressure Injury POA: Yes Measurement: see nursing flow sheets Wound bed:see above  Drainage (amount, consistency, odor) see nursing flow sheets Periwound: intact  Dressing procedure/placement/frequency: Cleanse sacral and buttock wounds with saline, pat dry Apply Santyl  to the sacral central wound (only to this wound) Cover each area with single layer of xeroform; top with foam. Change daily  Low air loss mattress for moisture management and pressure redistribution Chair pressure redistribution pad if up in the chair, to be sent with patient for use post DC    Re consult if needed, will not follow at this time. Thanks  Burdell Peed [employer], RN,CWOCN, CNS, [employer] [phone]

## 2023-09-23 NOTE — Progress Notes (Signed)
 PHARMACY CONSULT NOTE - FOLLOW UP  Pharmacy Consult for Electrolyte Monitoring and Replacement   Recent Labs: Potassium (mmol/L)  Date Value  09/22/2023 3.0 (L)  11/16/2013 4.0   Magnesium  (mg/dL)  Date Value  92/75/7974 1.8   Calcium  (mg/dL)  Date Value  91/96/7974 7.5 (L)   Calcium , Total (mg/dL)  Date Value  90/71/7984 7.9 (L)   Albumin (g/dL)  Date Value  91/96/7974 2.2 (L)  07/06/2015 4.2  10/07/2012 2.9 (L)   Phosphorus (mg/dL)  Date Value  92/75/7974 3.5   Sodium (mmol/L)  Date Value  09/22/2023 137  07/06/2015 146 (H)  11/16/2013 131 (L)     Assessment: 8/3 @ 2024:  K = 3.0                      Ca = 7.5,  Alb = 2.2,  Corrected Ca = 8.94  Goal of Therapy:  Electrolytes WNL   Plan:  KCl 10 mEq IV X 5 ordered to start on 8/4 @ 0030. - recheck electrolytes on 8/4 with AM labs.   Silver Selinda BIRCH ,PharmD Clinical Pharmacist 09/23/2023 12:18 AM

## 2023-09-23 NOTE — Plan of Care (Signed)

## 2023-09-23 NOTE — Progress Notes (Signed)
 Progress Note   [name] FMW:[id number] DOB: April 13, 1960 DOA: 09/22/2023     0 DOS: the patient was seen and examined on 09/23/2023   Brief hospital course: HPI Tricia [name] is a 63 y.o. 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 She had no loss of consciousness and reports no injury. Patient's recent hospital stay was complicated by urinary retention requiring Foley and a DSS referral due to concerns for inadequate care at home.  She also had episodes of hypotension treated with midodrine .      In the ED, tachycardic to 101 and febrile to 100.1, BP 111/66. Labs notable for normal WBC of 5.8 and normal lactic acid of 1.1.  Hemoglobin 9.3 (baseline 9.6-11.5).  Potassium 3.0.  AST 44, hypoalbuminemia with albumin 2.2 and total protein 5.5, calcium  7.5.  Creatinine normal. Respiratory viral panel negative for COVID flu and RSV. Urinalysis pending. CK pending EKG showing NSR at 99 with no concerning ischemic findings Chest x-ray concerning for patchy infiltrates as follows: 1. Patchy airspace opacities in the right mid and lower lung, possibly due to atelectasis or infiltrates. Consider CT for further evaluation. 2. No pleural effusion or pneumothorax   Patient was given a fluid bolus, started on Rocephin  and vancomycin . Admission requested       Assessment and Plan:  Sepsis secondary to acute diarrhea likely  secondary to C. difficile infection History of MAC s/p treatment April 2025 COPD with chronic respiratory failure on home O2 at 2 L Tobacco use disorder Patient spiked temperature to 101.1, tachycardia up to 105 in the setting of pneumonia as well as Cdiff antigen positive BC by PCR negative and toxin negative with ongoing diarrhea CT scan of the chest did not show any definite infiltrate We will continue oral vancomycin  for C. difficile treatment given significant symptoms Antitussives, flutter valve and incentive spirometer Continue nicotine patch    History of Klebsiella UTI 09/11/2023 History of urinary retention requiring Foley 08/2023 Follow-up UA Continue Foley Continue ceftriaxone    Physical deconditioning History of DSS referral July 2025 Continue PT OT TOC on board   Frequent falls History of recurrent syncope History of hypotension/orthostatic hypotension requiring midodrine  Possible polypharmacy EF 60 to 65%, G1 DD 08/30/2023 Patient still with orthostatic hypotension Continue supplemental IV fluid Continue midodrine  Minimize sedating meds if possible(BuSpar , hydroxyzine , cyclobenzaprine , gabapentin , long and short acting opiates, Seroquel ) PT and TOC consult   Chronic pain Chronic prescription opioid use Continue Xtampza  and MS IR pending verification Continue Cymbalta    Hypoalbuminemia due to protein-calorie malnutrition (HCC) Dietary consult   Chronic diastolic heart failure (HCC) Clinically euvolemic to dry EF 60 to 65%, G1 DD 08/30/2023 Continue Jardiance .  Will hold furosemide  tonight   Diabetes mellitus type 2, insulin  dependent (HCC) Sliding scale insulin  coverage   GAD (generalized anxiety disorder) Holding buspirone  and hydroxyzine  tonight.  Continue duloxetine  and Seroquel    Parkinson's disease (HCC) Continue Sinemet  Will hold gabapentin  for tonight  History of urinary retention Has required Foley during hospital stay from 7/9 - 09/18/23        DVT prophylaxis: Lovenox    Consults: none   Advance Care Planning:   Code Status: Prior    Family Communication: none   Disposition Plan: Back to previous home environment     Subjective:  Patient seen and examined at bedside this morning Still altered unable to contribute appropriately to history Denies nausea vomiting abdominal pain no chest pain  Physical Exam:  Vitals and nursing note reviewed.  Constitutional:   HENT:     Head: Normocephalic and atraumatic.  Cardiovascular:     Rate and Rhythm: Regular rhythm.present.     Heart sounds: Normal heart sounds.  Pulmonary:     Effort: Pulmonary effort is normal.     Breath sounds: Normal breath sounds.  Abdominal:     Palpations: Abdomen is soft.     Tenderness: There is no abdominal tenderness.  Neurological:     Mental Status: Mental status is at baseline.    Vitals:   09/23/23 0423 09/23/23 0748 09/23/23 1215 09/23/23 1546  BP: (!) 97/54 103/74 (!) 97/53 (!) 92/54  Pulse: 96 87 92 90  Resp: 18  16 16   Temp: 97.8 F (36.6 C) 98.1 F (36.7 C) 98.4 F (36.9 C) 98.4 F (36.9 C)  TempSrc:  Oral    SpO2: 100%  100% 100%  Weight:      Height:        Data Reviewed:    Latest Ref Rng & Units 09/23/2023    5:01 AM 09/22/2023    8:24 PM 09/15/2023   12:42 PM  CBC  WBC 4.0 - 10.5 K/uL 4.9  5.8  9.0   Hemoglobin 12.0 - 15.0 g/dL 9.4  9.3  9.9   Hematocrit 36.0 - 46.0 % 28.8  28.6  30.5   Platelets 150 - 400 K/uL 312  328  302        Latest Ref Rng & Units 09/23/2023   11:33 AM 09/23/2023    5:01 AM 09/22/2023    8:24 PM  BMP  Glucose 70 - 99 mg/dL  892  91   BUN 8 - 23 mg/dL  22  22   Creatinine 9.55 - 1.00 mg/dL  8.82  9.07   Sodium 864 - 145 mmol/L  132  137   Potassium 3.5 - 5.1 mmol/L 3.4  3.0  3.0   Chloride 98 - 111 mmol/L  104  106   CO2 22 - 32 mmol/L  22  22   Calcium  8.9 - 10.3 mg/dL  7.2  7.5     Family Communication: None present at bedside  Disposition: Pending medical  stabilization  Status is: Inpatient   Time spent: 52 minutes  Author: Drue ONEIDA Potter, MD 09/23/2023 4:56 PM  For on call review [email or web address].

## 2023-09-23 NOTE — Progress Notes (Signed)
 Physical Therapy Evaluation Patient Details Name: Tricia [name] MRN: [id number] DOB: 09/05/1960 Today's Date: 09/23/2023  History of Present Illness  Pt is a 63 y/o female admitted secondary to a fall and AMS. Pt was found down after several hours. Pt found to have CAP, SIRS and ?sepsis. Patient's recent hospital stay was complicated by urinary retention requiring Foley and a DSS referral due to concerns for inadequate care at home.  She also had episodes of hypotension treated with midodrine . 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   Clinical Impression  Pt presented supine in bed with HOB elevated, awake and willing to participate in therapy session. Prior to admission, pt reported that she was independent with all functional mobility and ADLs. Pt lives with her son-in-law in a single level home with 4 steps to enter (with rail). At the time of evaluation, pt very limited with functional mobility secondary to generalized weakness. She required min A with bed mobility and min-mod Ax2 for transfers with use of RW. Pt attempted to use the BSC to urinate during session; however, with no success. Per RN, pt has >972mL of urine retention. Pt returned back to bed at end of session to allow for RNs to place Foley. Pt would continue to benefit from skilled physical therapy services at this time while admitted and after d/c to address the below listed limitations in order to improve overall safety and independence with functional mobility.         If plan is discharge home, recommend the following: A lot of help with walking and/or transfers;A lot  of help with bathing/dressing/bathroom;Assistance with cooking/housework;Assist for transportation;Help with stairs or ramp for entrance;Supervision due to cognitive status   Can travel by private vehicle   No    Equipment Recommendations Other (comment) (defer to next venue of care)  Recommendations for Other Services       Functional Status Assessment Patient has had a recent decline in their functional status and demonstrates the ability to make significant improvements in function in a reasonable and predictable amount of time.     Precautions / Restrictions Precautions Precautions: Fall Recall of Precautions/Restrictions: Impaired Restrictions Weight Bearing Restrictions Per Provider Order: No      Mobility  Bed Mobility Overal bed mobility: Needs Assistance Bed Mobility: Supine to Sit, Sit to Supine     Supine to sit: Min assist Sit to supine: Min assist   General bed mobility comments: increased time and effort, HOB elevated, use of bed rails, min A for trunk elevation and to return bilateral LEs onto bed    Transfers Overall transfer level: Needs assistance Equipment used: Rolling walker (2 wheels) Transfers: Sit to/from Stand, Bed to chair/wheelchair/BSC Sit to Stand: Mod assist, +2 safety/equipment   Step pivot transfers: Min assist, +2 safety/equipment       General transfer comment: mod A x2 to power into standing from EOB x1 and from [facility] x1; min A x2 for step-pivot transfers for safety and stability    Ambulation/Gait               General Gait Details: unable to assess this date; RNs present to place Foley  cath as pt with >999 mL of urine retention  Stairs            Wheelchair Mobility     Tilt Bed    Modified Rankin (Stroke Patients Only)       Balance Overall balance assessment: Needs assistance Sitting-balance support: Feet supported Sitting balance-Leahy Scale: Fair     Standing balance support: During functional  activity, Bilateral upper extremity supported, Reliant on assistive device for balance Standing balance-Leahy Scale: Poor Standing balance comment: min A and use of RW                             Pertinent Vitals/Pain Pain Assessment Pain Assessment: Faces Faces Pain Scale: Hurts little more Pain Location: back and bilateral LEs Pain Descriptors / Indicators: Guarding, Sore Pain Intervention(s): Monitored during session, Repositioned    Home Living Family/patient expects to be discharged to:: Private residence Living Arrangements: Other relatives (son-in-law) Available Help at Discharge: Available PRN/intermittently Type of Home: House Home Access: Stairs to enter Entrance Stairs-Rails: Lawyer of Steps: 4   Home Layout: One level Home Equipment: Agricultural consultant (2 wheels);Shower seat - built in      Prior Function Prior Level of Function : History of Falls (last six months)             Mobility Comments: Pt only admitting to having 2 falls in the past 6 months, despite her telling another PT during a previous recent admission that she has fallen 20-25 times this year. Pt reporting that she still drives and grocery shops by herself ADLs Comments: Pt. reports independence with ADLs, and IADLs     Extremity/Trunk Assessment   Upper Extremity Assessment Upper Extremity Assessment: Defer to OT evaluation    Lower Extremity Assessment Lower Extremity Assessment: Generalized weakness       Communication   Communication Communication: No apparent difficulties    Cognition Arousal: Alert Behavior During Therapy: WFL for tasks assessed/performed   PT - Cognitive impairments: Memory, Initiation, Sequencing, Problem solving, Safety/Judgement                       PT - Cognition Comments: pt requiring multimodal cueing throughout to complete tasks and for sequencing and motor planning. Following commands:  Impaired Following commands impaired: Follows one step commands inconsistently     Cueing Cueing Techniques: Verbal cues, Visual cues, Gestural cues, Tactile cues     General Comments      Exercises     Assessment/Plan    PT Assessment Patient needs continued PT services  PT Problem List Decreased strength;Decreased range of motion;Decreased activity tolerance;Decreased balance;Decreased mobility;Decreased coordination;Decreased cognition;Decreased knowledge of use of DME;Decreased safety awareness;Decreased knowledge of precautions;Pain       PT Treatment Interventions DME instruction;Gait training;Stair training;Functional mobility training;Therapeutic activities;Therapeutic exercise;Balance training;Neuromuscular re-education;Cognitive remediation;Patient/family education    PT Goals (Current goals can be found in the Care Plan section)  Acute Rehab PT Goals Patient Stated Goal: to use the bathroom PT Goal Formulation: With patient Time For Goal Achievement: 10/07/23 Potential to Achieve Goals: Fair    Frequency Min 2X/week     Co-evaluation               AM-PAC PT 6 Clicks Mobility  Outcome Measure Help needed turning from your back to your side while in a flat bed without using bedrails?: A Little Help needed moving from lying on your back  to sitting on the side of a flat bed without using bedrails?: A Little Help needed moving to and from a bed to a chair (including a wheelchair)?: A Lot Help needed standing up from a chair using your arms (e.g., wheelchair or bedside chair)?: A Lot Help needed to walk in hospital room?: A Lot Help needed climbing 3-5 steps with a railing? : Total 6 Click Score: 13    End of Session Equipment Utilized During Treatment: Gait belt Activity Tolerance: Patient tolerated treatment well Patient left: in bed;with call bell/phone within reach;Other (comment) (2 RNs present to place Foley catheter) Nurse Communication: Mobility  status PT Visit Diagnosis: Other abnormalities of gait and mobility (R26.89)    Time: 9171-9147 PT Time Calculation (min) (ACUTE ONLY): 24 min   Charges:   PT Evaluation $PT Eval Moderate Complexity: 1 Mod PT Treatments $Therapeutic Activity: 8-22 mins PT General Charges $$ ACUTE PT VISIT: 1 Visit         Delon DELENA KLEIN, DPT  Acute Rehabilitation Services Office [phone]   Delon HERO Rani Idler 09/23/2023, 11:09 AM

## 2023-09-23 NOTE — Progress Notes (Signed)
 DSS at bedside to perform evaluation.

## 2023-09-23 NOTE — Progress Notes (Signed)
 Initial Nutrition Assessment  DOCUMENTATION CODES:   Severe malnutrition in context of social or environmental circumstances  INTERVENTION:   -Liberalize diet to carb modified -MVI with minerals daily -500 mg vitamin C  BID -220 gm zinc  sulfate daily x 14 days -Magic cup TID with meals, each supplement provides 290 kcal and 9 grams of protein   NUTRITION DIAGNOSIS:   Severe Malnutrition related to social / environmental circumstances as evidenced by moderate fat depletion, severe fat depletion, moderate muscle depletion, severe muscle depletion.  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  Pt admitted with CAP.   Reviewed I/O's: +3.1 L x 24 hours  UOP: 1.5 L x 24 hours   Reviewed I/O's: +3.1 L since admission  Per CWOCN notes, pt with unstageable pressure injury to sacrum, stage 2 pressure injury to rt buttock x 2, and stage 1 pressure injury to lt buttock.   Pt very familiar to this RD due to recent extensive hospitalization. Noted that pt left AMA on 09/18/23.   Pt sitting up in bed at time of visit. Pt voices dissatisfaction about being in the hospital again. Pt admits to frequent falls at home and sleeping on the floor in  her home. Pt states she is upset with her son in law, who called the paramedics when she fell on the floor and was unable to get up (I'm telling you- it's normal to sleep on the floor). RD provided emotional support and tried to explain the dangers of frequent falls and sleeping on the floor, however, pt did not accept explanation.   Pt with improved oral intake. She is currently on a heart healthy, carb modified diet. Noted meal completions 100%. Pt consumed 100% of her breakfast during RD visit. No difficulties chewing or swallowing.   Wt has been stable over the past year.  Dicussed importance of good meal and supplement intake to promote healing.   Medications reviewed and include vitamin C , sinemet , jardiance , and zinc  sulfate.   Lab Results  Component Value Date   HGBA1C 6.0 (A) 05/01/2023   PTA DM medications are none.   Labs reviewed: Na: 132, K: 3.0, CBGS: 61-70 (inpatient orders for glycemic control are 0-5 units insulin  aspart daily at bedtime and 0-9 units insulin  aspart TID with meals).    NUTRITION - FOCUSED PHYSICAL EXAM:  Flowsheet Row Most Recent Value  Orbital Region Severe depletion  Upper Arm Region Severe depletion  Thoracic and Lumbar Region Moderate depletion  Buccal Region Moderate depletion  Temple Region Severe depletion  Clavicle Bone Region Severe depletion  Clavicle and Acromion Bone Region Severe depletion  Scapular Bone Region Severe depletion  Dorsal Hand Moderate depletion  Patellar Region Moderate depletion  Anterior Thigh Region Moderate depletion  Posterior Calf Region Moderate depletion  Edema (RD Assessment) None  Hair Reviewed  Eyes Reviewed  Mouth Reviewed  Skin Reviewed    Diet Order:   Diet Order             Diet Carb Modified Fluid consistency: Thin  Diet effective now                   EDUCATION NEEDS:   Education needs have been addressed  Skin:  Skin Assessment: Skin Integrity Issues: Skin Integrity Issues::  Unstageable, Stage I Stage I: lt buttock Stage II: rt buttock x 2 Unstageable: sacrum  Last BM:  09/23/23 (type 7)  Height:   Ht Readings from Last 1 Encounters:  09/22/23 5' 8 (1.727 m)    Weight:   Wt Readings from Last 1 Encounters:  09/22/23 67.1 kg    Ideal Body Weight:  63.6 kg  BMI:  Body mass index is 22.49 kg/m.  Estimated Nutritional Needs:   Kcal:  2000-2200  Protein:  100-115 grams  Fluid:  2.0-2.2 L    Margery ORN, RD, LDN, CDCES Registered Dietitian III Certified Diabetes Care and Education Specialist If unable to reach this RD, please use RD Inpatient group chat on secure chat between hours of 8am-4 pm daily

## 2023-09-24 ENCOUNTER — Other Ambulatory Visit (HOSPITAL_COMMUNITY): Payer: Self-pay

## 2023-09-24 ENCOUNTER — Telehealth (HOSPITAL_COMMUNITY): Payer: Self-pay | Admitting: Pharmacy Technician

## 2023-09-24 DIAGNOSIS — R6510 Systemic inflammatory response syndrome (SIRS) of non-infectious origin without acute organ dysfunction: Secondary | ICD-10-CM | POA: Diagnosis not present

## 2023-09-24 LAB — BASIC METABOLIC PANEL WITH GFR
Anion gap: 4 — ABNORMAL LOW (ref 5–15)
BUN: 20 mg/dL (ref 8–23)
CO2: 22 mmol/L (ref 22–32)
Calcium: 7.1 mg/dL — ABNORMAL LOW (ref 8.9–10.3)
Chloride: 110 mmol/L (ref 98–111)
Creatinine, Ser: 1.12 mg/dL — ABNORMAL HIGH (ref 0.44–1.00)
GFR, Estimated: 55 mL/min — ABNORMAL LOW (ref >60)
Glucose, Bld: 139 mg/dL — ABNORMAL HIGH (ref 70–99)
Potassium: 3.4 mmol/L — ABNORMAL LOW (ref 3.5–5.1)
Sodium: 136 mmol/L (ref 135–145)

## 2023-09-24 LAB — GLUCOSE, CAPILLARY
Glucose-Capillary: 94 mg/dL (ref 70–99)
Glucose-Capillary: 144 mg/dL — ABNORMAL HIGH (ref 70–99)
Glucose-Capillary: 105 mg/dL — ABNORMAL HIGH (ref 70–99)
Glucose-Capillary: 130 mg/dL — ABNORMAL HIGH (ref 70–99)

## 2023-09-24 LAB — MAGNESIUM: Magnesium: 2.2 mg/dL (ref 1.7–2.4)

## 2023-09-24 LAB — PHOSPHORUS: Phosphorus: 3 mg/dL (ref 2.5–4.6)

## 2023-09-24 MED ORDER — PANTOPRAZOLE SODIUM 40 MG PO TBEC
40.0000 mg | DELAYED_RELEASE_TABLET | Freq: Every day | ORAL | Status: DC
  Administered 2023-09-24 – 2023-09-30 (×8): 40 mg via ORAL
  Filled 2023-09-24 (×7): qty 1

## 2023-09-24 MED ORDER — POTASSIUM CHLORIDE CRYS ER 20 MEQ PO TBCR
20.0000 meq | EXTENDED_RELEASE_TABLET | Freq: Once | ORAL | Status: AC
  Administered 2023-09-24: 20 meq via ORAL
  Filled 2023-09-24: qty 1

## 2023-09-24 NOTE — TOC Progression Note (Signed)
 Transition of Care [facility]) - Progression Note    Patient Details  Name: Tricia [name] MRN: [id number] Date of Birth: 14-Mar-1960  Transition of Care [facility]) CM/SW Contact  Dalia GORMAN Fuse, RN Phone Number: 09/24/2023, 2:49 PM  Clinical Narrative:    The patient with a recent hospital discharge last week readmitted with a fall. Therapy is recommending SNF. TOC spoke with the patient's granddaughter and offered choice. She would like the [facility] sent out in [facility]. She advised that depending on how the patient does in rehab she may want to have DSS pursue guardianship.[name] granddaughter advised she and other family members work full-time and are unable to take care of the patient if her functional status doesn't improve with rehab.  She stated that she will reachout to DSS if needed once she sees how the patient progresses.  Upon review of the patient's medical record DSS is involved. TOC lvmm for DSS worker Harlene Iba and requested call back.  TOC sent FL2 out in [place], [place].    Expected Discharge Plan: Skilled Nursing Facility Barriers to Discharge: Continued Medical Work up               Expected Discharge Plan and Services   Discharge Planning Services: CM Consult   Living arrangements for the past 2 months: Single Family Home                                       Social Drivers of Health (SDOH) Interventions SDOH Screenings   Food Insecurity: No Food Insecurity (09/22/2023)  Housing: Low Risk  (09/22/2023)  Transportation Needs: No Transportation Needs (09/22/2023)  Utilities: Not At Risk (09/22/2023)  Alcohol Screen: Low Risk  (03/07/2023)  Depression (PHQ2-9): High Risk (05/01/2023)  Financial Resource Strain: Patient Declined (06/18/2023)   Received from [facility]  Physical Activity: Sufficiently Active (03/07/2023)  Social Connections: Socially Isolated (08/29/2023)  Stress: No Stress Concern Present (03/07/2023)  Tobacco  Use: High Risk (08/28/2023)  Health Literacy: Adequate Health Literacy (03/07/2023)    Readmission Risk Interventions     No data to display

## 2023-09-24 NOTE — Progress Notes (Signed)
 PHARMACY CONSULT NOTE - ELECTROLYTES  Pharmacy Consult for Electrolyte Monitoring and Replacement   Recent Labs: Height: 5' 8 (172.7 cm) Weight: 67.1 kg (147 lb 14.9 oz) IBW/kg (Calculated) : 63.9 Estimated Creatinine Clearance: 51.9 mL/min (A) (by C-G formula based on SCr of 1.12 mg/dL (H)). Potassium (mmol/L)  Date Value  09/24/2023 3.4 (L)  11/16/2013 4.0   Magnesium  (mg/dL)  Date Value  91/94/7974 2.2   Calcium  (mg/dL)  Date Value  91/94/7974 7.1 (L)   Calcium , Total (mg/dL)  Date Value  90/71/7984 7.9 (L)   Albumin (g/dL)  Date Value  91/95/7974 1.9 (L)  07/06/2015 4.2  10/07/2012 2.9 (L)   Phosphorus (mg/dL)  Date Value  91/94/7974 3.0   Sodium (mmol/L)  Date Value  09/24/2023 136  07/06/2015 146 (H)  11/16/2013 131 (L)   Corrected Ca: 8.9 mg/dL  Assessment  Tricia [name] is a 63 y.o. female presenting after mechanical fall at home with unknown downtime. PMH significant for HFpEF(EF 60 to 65%, G1 DD 08/30/2023), type II IDDM, HTN, GAD, Parkinson's, chronic pain on chronic opiates, tobacco use disorder COPD, stage II on home O2 at 2 L. Pharmacy has been consulted to monitor and replace electrolytes.  Diet: Regular, carb modified MIVF: LR @ 150 mL/hr Pertinent medications: N/A  Goal of Therapy: Electrolytes WNL  Plan:  K 3.4  Placed order for 20 mEq PO x1 this AM, per protocol Check BMP, Mg, Phos with AM labs  Thank you for allowing pharmacy to be a part of this patient's care.  Leonor JAYSON Argyle, PharmD Clinical Pharmacist 09/24/2023 9:34 AM

## 2023-09-24 NOTE — Progress Notes (Signed)
 Progress Note   [name] FMW:[id number] DOB: [birth year] DOA: 09/22/2023     1 DOS: the patient was seen and examined on 09/24/2023     Brief hospital course: HPI Tricia [name] is a 63 y.o. 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 She had no loss of consciousness and reports no injury. Patient's recent hospital stay was complicated by urinary retention requiring Foley and a DSS referral due to concerns for inadequate care at home.  She also had episodes of hypotension treated with midodrine .      In the ED, tachycardic to 101 and febrile to 100.1, BP 111/66. Labs notable for normal WBC of 5.8 and normal lactic acid of 1.1.  Hemoglobin 9.3 (baseline 9.6-11.5).  Potassium 3.0.  AST 44, hypoalbuminemia with albumin 2.2 and total protein 5.5, calcium  7.5.  Creatinine normal. Respiratory viral panel negative for COVID flu and RSV. Urinalysis pending. CK pending EKG showing NSR at 99 with no concerning ischemic findings Chest x-ray concerning for patchy infiltrates as follows: 1. Patchy airspace opacities in the right mid and lower lung, possibly due to atelectasis or infiltrates. Consider CT for further evaluation. 2. No pleural effusion or pneumothorax   Patient was given a fluid bolus, started on Rocephin  and vancomycin . Admission requested        Assessment and Plan:   Sepsis secondary to acute diarrhea likely  secondary to C. difficile infection History of MAC s/p treatment April 2025 Patient spiked temperature to 101.1, tachycardia up to 105 in the setting of Cdiff antigen positivity but PCR negative and toxin negative with ongoing diarrhea CT scan of the chest did not show any definite infiltrate We will continue oral vancomycin  for C. difficile treatment given significant symptoms    COPD with chronic respiratory failure on home O2 at 2 L Tobacco use disorder Antitussives, flutter valve and incentive spirometer Continue nicotine patch     History of Klebsiella UTI 09/11/2023 History of urinary retention requiring Foley 08/2023 Follow-up UA Continue Foley Continue ceftriaxone    Physical deconditioning History of DSS referral July 2025 Continue PT OT TOC on board   Frequent falls History of recurrent syncope History of hypotension/orthostatic hypotension requiring midodrine  Possible polypharmacy EF 60 to 65%, G1 DD 08/30/2023 Patient still with orthostatic hypotension Continue supplemental IV fluid Continue midodrine  Minimize sedating meds if possible TOC following   Chronic pain Chronic prescription opioid use Continue Cymbalta    Hypoalbuminemia due to protein-calorie malnutrition (HCC) Dietary consulted   Chronic diastolic heart failure (HCC) Clinically euvolemic to dry EF 60 to 65%, G1 DD 08/30/2023 Continue Jardiance .  Holding Lasix  in the setting of ongoing diarrhea needing IV fluid supplementation   Diabetes mellitus type 2, insulin  dependent (HCC) Sliding scale insulin  coverage   GAD (generalized anxiety disorder)   Continue duloxetine  and Seroquel    Parkinson's disease (HCC) Continue Sinemet  Will hold gabapentin  for tonight   History of urinary retention Has required Foley during hospital stay  from 7/9 - 09/18/23    DVT prophylaxis: Lovenox    Consults: none   Advance Care Planning:   Code Status: Prior    Family Communication: none   Disposition  Plan: Back to previous home environment       Subjective:  Patient seen and examined at bedside this morning Patient still continues to have diarrhea but improving Denies worsening abdominal pain nausea or vomiting Mental status better today   Physical Exam:   Vitals and nursing note reviewed.  Constitutional:   HENT:     Head: Normocephalic and atraumatic.  Cardiovascular:     Rate and Rhythm: Regular rhythm.present.     Heart sounds: Normal heart sounds.  Pulmonary:     Effort: Pulmonary effort is normal.     Breath sounds: Normal breath sounds.  Abdominal:     Palpations: Abdomen is soft.     Tenderness: There is no abdominal tenderness.  Neurological:     Mental Status: Mental status is at baseline.   Family Communication: None present at bedside   Disposition: Pending medical stabilization   Status is: Inpatient   Time spent: 50 minutes  Data Reviewed:    Latest Ref Rng & Units 09/23/2023    5:01 AM 09/22/2023    8:24 PM 09/15/2023   12:42 PM  CBC  WBC 4.0 - 10.5 K/uL 4.9  5.8  9.0   Hemoglobin 12.0 - 15.0 g/dL 9.4  9.3  9.9   Hematocrit 36.0 - 46.0 % 28.8  28.6  30.5   Platelets 150 - 400 K/uL 312  328  302        Latest Ref Rng & Units 09/24/2023    3:55 AM 09/23/2023   11:33 AM 09/23/2023    5:01 AM  BMP  Glucose 70 - 99 mg/dL 860   892   BUN 8 - 23 mg/dL 20   22   Creatinine 9.55 - 1.00 mg/dL 8.87   8.82   Sodium 864 - 145 mmol/L 136   132   Potassium 3.5 - 5.1 mmol/L 3.4  3.4  3.0   Chloride 98 - 111 mmol/L 110   104   CO2 22 - 32 mmol/L 22   22   Calcium  8.9 - 10.3 mg/dL 7.1   7.2     Vitals:   09/24/23 0409 09/24/23 0740 09/24/23 0858 09/24/23 1236  BP: 102/60 106/61 116/65 123/63  Pulse: 89 80  87  Resp: 20 16  16   Temp: 99 F (37.2 C) 98.2 F (36.8 C)  98.8 F (37.1 C)  TempSrc: Oral     SpO2: 100% 100%  100%  Weight:      Height:         Author: Drue ONEIDA Potter, MD 09/24/2023 1:21 PM  For on call review [email or web address].

## 2023-09-24 NOTE — Progress Notes (Signed)
 Physical Therapy Treatment Patient Details Name: Tricia [name] MRN: [id number] DOB: Jun 24, 1960 Today's Date: 09/24/2023   History of Present Illness Pt is a 63 y/o female admitted secondary to a fall and AMS. Pt was found down after several hours. Pt found to have CAP, SIRS and ?sepsis. Patient's recent hospital stay was complicated by urinary retention requiring Foley and a DSS referral due to concerns for inadequate care at home.  She also had episodes of hypotension treated with midodrine . 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    PT Comments  Pt received reclined in bed agreeable to PT. Pt requiring minA+1 for bed mobility along with bed features and increased time. ModA+1 to STS to RW with VC's for hand placement. Noted to be soiled in BM thus returned to sitting and provided BSC. ModA+1 to STS again with CGA for SPT using RW to [facility]. Pt with unsuccessful BM. Dependent for pericare due to heavy BUE support needed on RW. Is able to ambulate ~5' to recliner with very limited eccentric control but safe use of hand placement. Pt placed in recliner with all needs in reach. Pt generally very weak and deconditioned. D/c recs remain appropriate.      If plan is discharge home, recommend the following: A lot of help with walking and/or transfers;A lot of help with bathing/dressing/bathroom;Assistance with cooking/housework;Assist for transportation;Help with stairs or ramp for entrance;Supervision due to cognitive status   Can travel by private vehicle     No  Equipment Recommendations  Other (comment) (TBD by next venue of care)     Recommendations for Other Services       Precautions / Restrictions Precautions Precautions: Fall Recall of Precautions/Restrictions: Impaired Restrictions Weight Bearing Restrictions Per Provider Order: No Other Position/Activity Restrictions: Orthostatic Hypotension     Mobility  Bed Mobility Overal bed mobility: Needs Assistance Bed Mobility: Supine to Sit     Supine to sit: Min assist, HOB elevated, Used rails     General bed mobility comments: bed features and increased time Patient Response: Cooperative  Transfers Overall transfer level: Needs assistance Equipment used: Rolling walker (2 wheels) Transfers: Sit to/from Stand, Bed to chair/wheelchair/BSC Sit to Stand: Mod assist   Step pivot transfers: Contact guard assist       General transfer comment: modA for all STS. Once standing, CGA using RW for SPT    Ambulation/Gait Ambulation/Gait assistance: Contact guard assist Gait Distance (Feet): 5 Feet Assistive device: Rolling walker (2 wheels) Gait Pattern/deviations: Step-through pattern, Decreased step length - right, Decreased step length - left, Trunk flexed       General Gait Details: ambulating from [facility] at foot of bed into recliner   Stairs             Wheelchair Mobility     Tilt Bed Tilt Bed Patient Response: Cooperative  Modified Rankin (Stroke Patients Only)       Balance Overall balance assessment: Needs assistance Sitting-balance support: Feet supported, Bilateral upper extremity supported Sitting balance-Leahy Scale: Fair     Standing balance support: During functional activity, Bilateral upper extremity supported, Reliant on assistive device for balance Standing balance-Leahy Scale: Poor Standing balance comment:  heavy BUE support on RW                            Communication Communication Communication: No apparent difficulties  Cognition Arousal: Alert Behavior During Therapy: WFL for tasks  assessed/performed                             Following commands: Impaired Following commands impaired: Follows one step commands with increased time    Cueing Cueing Techniques: Verbal cues  Exercises      General Comments        Pertinent Vitals/Pain Pain Assessment Pain Assessment: Faces Faces Pain Scale: Hurts even more Pain Location: B knees Pain Descriptors / Indicators: Discomfort, Moaning Pain Intervention(s): Monitored during session, Repositioned, Limited activity within patient's tolerance    Home Living                          Prior Function            PT Goals (current goals can now be found in the care plan section) Acute Rehab PT Goals Patient Stated Goal: to use the bathroom PT Goal Formulation: With patient Time For Goal Achievement: 10/07/23 Potential to Achieve Goals: Fair Progress towards PT goals: Progressing toward goals    Frequency    Min 2X/week      PT Plan      Co-evaluation              AM-PAC PT 6 Clicks Mobility   Outcome Measure  Help needed turning from your back to your side while in a flat bed without using bedrails?: A Little Help needed moving from lying on your back to sitting on the side of a flat bed without using bedrails?: A Little Help needed moving to and from a bed to a chair (including a wheelchair)?: A Lot Help needed standing up from a chair using your arms (e.g., wheelchair or bedside chair)?: A Lot Help needed to walk in hospital room?: A Lot Help needed climbing 3-5 steps with a railing? : A Lot 6 Click Score: 14    End of Session Equipment Utilized During Treatment: Gait belt Activity Tolerance: Patient limited by fatigue;Patient tolerated treatment well Patient left: in chair;with call bell/phone within reach;with chair alarm set Nurse Communication: Mobility status PT Visit Diagnosis: Other abnormalities of gait and mobility (R26.89)     Time: 8864-8792 PT Time  Calculation (min) (ACUTE ONLY): 32 min  Charges:    $Therapeutic Activity: 23-37 mins PT General Charges $$ ACUTE PT VISIT: 1 Visit                     Dorina HERO. Fairly IV, PT, DPT Physical Therapist- [place]  [facility] 09/24/2023, 12:57 PM

## 2023-09-24 NOTE — Telephone Encounter (Signed)
 Patient Product/process development scientist completed.    The patient is insured through [facility]. Patient has Medicare and is not eligible for a copay card, but may be able to apply for patient assistance or Medicare RX Payment Plan (Patient Must reach out to their plan, if eligible for payment plan), if available.    Ran test claim for vancomycin 125 mg and the current 10 day co-pay is $0.00.   This test claim was processed through [facility]- copay amounts may vary at other pharmacies due to pharmacy/plan contracts, or as the patient moves through the different stages of their insurance plan.     Morgan Arab, CPHT Pharmacy Technician III Certified Patient Advocate [facility] Patient Advocate Team Direct Number: [phone]  Fax: [phone]

## 2023-09-25 DIAGNOSIS — R6510 Systemic inflammatory response syndrome (SIRS) of non-infectious origin without acute organ dysfunction: Secondary | ICD-10-CM | POA: Diagnosis not present

## 2023-09-25 LAB — CBC WITH DIFFERENTIAL/PLATELET
Abs Immature Granulocytes: 0.04 K/uL (ref 0.00–0.07)
Basophils Absolute: 0 K/uL (ref 0.0–0.1)
Basophils Relative: 1 %
Eosinophils Absolute: 0.5 K/uL (ref 0.0–0.5)
Eosinophils Relative: 9 %
HCT: 28.6 % — ABNORMAL LOW (ref 36.0–46.0)
Hemoglobin: 9.2 g/dL — ABNORMAL LOW (ref 12.0–15.0)
Immature Granulocytes: 1 %
Lymphocytes Relative: 15 %
Lymphs Abs: 0.9 K/uL (ref 0.7–4.0)
MCH: 32.3 pg (ref 26.0–34.0)
MCHC: 32.2 g/dL (ref 30.0–36.0)
MCV: 100.4 fL — ABNORMAL HIGH (ref 80.0–100.0)
Monocytes Absolute: 0.4 K/uL (ref 0.1–1.0)
Monocytes Relative: 7 %
Neutro Abs: 4 K/uL (ref 1.7–7.7)
Neutrophils Relative %: 67 %
Platelets: 290 K/uL (ref 150–400)
RBC: 2.85 MIL/uL — ABNORMAL LOW (ref 3.87–5.11)
RDW: 16.2 % — ABNORMAL HIGH (ref 11.5–15.5)
WBC: 5.9 K/uL (ref 4.0–10.5)
nRBC: 0 % (ref 0.0–0.2)

## 2023-09-25 LAB — BASIC METABOLIC PANEL WITH GFR
Anion gap: 5 (ref 5–15)
BUN: 17 mg/dL (ref 8–23)
CO2: 22 mmol/L (ref 22–32)
Calcium: 7.1 mg/dL — ABNORMAL LOW (ref 8.9–10.3)
Chloride: 108 mmol/L (ref 98–111)
Creatinine, Ser: 1.01 mg/dL — ABNORMAL HIGH (ref 0.44–1.00)
GFR, Estimated: >60 mL/min (ref >60)
Glucose, Bld: 115 mg/dL — ABNORMAL HIGH (ref 70–99)
Potassium: 2.8 mmol/L — ABNORMAL LOW (ref 3.5–5.1)
Sodium: 135 mmol/L (ref 135–145)

## 2023-09-25 LAB — GLUCOSE, CAPILLARY
Glucose-Capillary: 87 mg/dL (ref 70–99)
Glucose-Capillary: 122 mg/dL — ABNORMAL HIGH (ref 70–99)
Glucose-Capillary: 79 mg/dL (ref 70–99)
Glucose-Capillary: 131 mg/dL — ABNORMAL HIGH (ref 70–99)

## 2023-09-25 LAB — MAGNESIUM: Magnesium: 1.7 mg/dL (ref 1.7–2.4)

## 2023-09-25 MED ORDER — POTASSIUM CHLORIDE 10 MEQ/100ML IV SOLN
10.0000 meq | INTRAVENOUS | Status: AC
  Administered 2023-09-25 (×2): 10 meq via INTRAVENOUS
  Filled 2023-09-25 (×2): qty 100

## 2023-09-25 MED ORDER — MAGNESIUM SULFATE 2 GM/50ML IV SOLN
2.0000 g | Freq: Once | INTRAVENOUS | Status: AC
  Administered 2023-09-25: 2 g via INTRAVENOUS
  Filled 2023-09-25: qty 50

## 2023-09-25 MED ORDER — POTASSIUM CHLORIDE CRYS ER 20 MEQ PO TBCR
40.0000 meq | EXTENDED_RELEASE_TABLET | Freq: Once | ORAL | Status: AC
  Administered 2023-09-25: 40 meq via ORAL
  Filled 2023-09-25: qty 2

## 2023-09-25 NOTE — Progress Notes (Signed)
 Occupational Therapy Treatment Patient Details Name: Tricia [name] MRN: [id number] DOB: 11/10/1960 Today's Date: 09/25/2023   History of present illness Pt is a 63 y/o female admitted secondary to a fall and AMS. Pt was found down after several hours. Pt found to have CAP, SIRS and ?sepsis. Patient's recent hospital stay was complicated by urinary retention requiring Foley and a DSS referral due to concerns for inadequate care at home.  She also had episodes of hypotension treated with midodrine . 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   OT comments  Pt seen for OT treatment on this date. Upon arrival to room pt supine in bed with HOB elevated, agreeable to tx. Pt requires Mod A for transfers sit to stand and once standing CGA at RW level.  ADLs education for LB dressing tasks and cuing for balance in sitting and standing during ADLs to prevent LOB when accessing LEs. Toileting completed with focus on safety with transfers and proficiency with task engagement.  Ongoing barriers to discharge include decreased functional balance, decreased activity tolerance, limitations in safety awareness, all resulting in increased burden of care.  Pt making good progress toward goals, will continue to follow POC. Discharge recommendation remains appropriate.        If plan is discharge home, recommend the following:  A little help with walking and/or transfers;A lot of help with bathing/dressing/bathroom;Direct supervision/assist for medications management;Supervision due to cognitive status;Direct supervision/assist for financial management;Assistance  with cooking/housework;Assist for transportation;Help with stairs or ramp for entrance   Equipment Recommendations       Recommendations for Other Services      Precautions / Restrictions Precautions Precautions: Fall Recall of Precautions/Restrictions: Impaired       Mobility Bed Mobility Overal bed mobility: Needs Assistance Bed Mobility: Supine to Sit Rolling: Min assist   Supine to sit: Min assist, HOB elevated, Used rails Sit to supine: Min assist   General bed mobility comments: bed features and increased time    Transfers Overall transfer level: Needs assistance Equipment used: Rolling walker (2 wheels) Transfers: Sit to/from Stand, Bed to chair/wheelchair/BSC Sit to Stand: Mod assist     Step pivot transfers: Contact guard assist     General transfer comment: Mod A for all STS with cuing for hand placement. Once standing, CGA using RW for SPT     Balance Overall balance assessment: Needs assistance Sitting-balance support: Single extremity supported, Feet supported, No upper extremity supported Sitting balance-Leahy Scale: Fair Sitting balance - Comments: lateral leaning and difficulty righting self during seated component of LB dressing tasks   Standing balance support: During functional activity, Bilateral upper extremity supported, Reliant on assistive device for balance Standing balance-Leahy Scale: Fair Standing balance comment: able to stand at RW and pull undergarment up following toileting at [facility].                           ADL either performed or assessed with clinical judgement   ADL       Grooming: Wash/dry face;Wash/dry hands;Supervision/safety;Standing;Contact guard assist Grooming Details (indicate cue type and reason): Standing at sink at RW level  Toilet Transfer: Minimal assistance;BSC/3in1;Stand-pivot;Moderate assistance Toilet Transfer Details (indicate cue type and reason): Mod A for sit to stand and  CGA for transfer to/from [facility] at RW level Toileting- Clothing Manipulation and Hygiene: Contact guard assist;Sit to/from stand Toileting - Clothing Manipulation Details (indicate cue type and reason): for pericare and donning/doffing undergarment, and assistance for threading catheter through     Functional mobility during ADLs: Contact guard assist      Extremity/Trunk Assessment Upper Extremity Assessment Upper Extremity Assessment: Generalized weakness            Vision       Perception     Praxis     Communication     Cognition Arousal: Alert Behavior During Therapy: WFL for tasks assessed/performed Cognition: No family/caregiver present to determine baseline                               Following commands: Impaired Following commands impaired: Follows one step commands with increased time      Cueing      Exercises Other Exercises Other Exercises: Sit to/from stand transfer training from various height surfaces, education on hand placement at RW level and for functional transfers, education on LB dressing tasks to maximize functional independence, safety awareness during ADLs.    Shoulder Instructions       General Comments      Pertinent Vitals/ Pain       Pain Assessment Pain Assessment: No/denies pain  Home Living                                          Prior Functioning/Environment              Frequency  Min 2X/week        Progress Toward Goals  OT Goals(current goals can now be found in the care plan section)  Progress towards OT goals: Progressing toward goals  Acute Rehab OT Goals Potential to Achieve Goals: Good  Plan      Co-evaluation                 AM-PAC OT 6 Clicks Daily Activity     Outcome Measure   Help from another person eating meals?: None Help from another person taking care of personal grooming?: None Help from another person toileting, which includes using toliet,  bedpan, or urinal?: A Little Help from another person bathing (including washing, rinsing, drying)?: A Little Help from another person to put on and taking off regular upper body clothing?: A Little Help from another person to put on and taking off regular lower body clothing?: A Lot 6 Click Score: 19    End of Session Equipment Utilized During Treatment: Rolling walker (2 wheels);Gait belt  OT Visit Diagnosis: Unsteadiness on feet (R26.81);Muscle weakness (generalized) (M62.81);History of falling (Z91.81)   Activity Tolerance Patient tolerated treatment well   Patient Left in bed;with call bell/phone within reach;with bed alarm set   Nurse Communication Mobility status        Time: 1350-1440 OT Time Calculation (min): 50 min  Charges: OT General Charges $OT Visit: 1 Visit OT Treatments $Self Care/Home Management : 38-52 mins  Tricia [name] OTR/L   Tricia [name] 09/25/2023, 3:07 PM

## 2023-09-25 NOTE — Progress Notes (Signed)
 PROGRESS NOTE    Tricia [name]   FMW:[id number] DOB: May 21, 1960  DOA: 09/22/2023 Date of Service: Oct 08, 2023 which is hospital day 2  PCP: Sowles, Krichna, MD    Hospital course / significant events:   HPI: Tricia [name] is a 63 y.o. 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 She had no loss of consciousness and reports no injury. Patient's recent hospital stay was complicated by urinary retention requiring Foley and a DSS referral due to concerns for inadequate care at home.  She also had episodes of hypotension treated with midodrine .   08/03: to ED, tachycardic to 101 and febrile to 100.1, BP 111/66. Admitted to hospitalist for SIRS concern possible sepsis, procalcitonin 0.6 but CT chest neg for PNA.  08/04: continuing Foley and awaiting cultures. PT/OT recs for SNF rehab 08/05: SNF placement in process. Of note, family considering for DSS guardianship as they are not able/willing to care for patient. TOC spoke w/ granddaugther, pt states she lives w/ son in law (her daughter is deceased).  2023/10/08: DSS in to see patient. Pt is on room air. Pending SNF choice by family.      Consultants:  none  Procedures/Surgeries: none      ASSESSMENT & PLAN:   Sepsis secondary to acute diarrhea likely secondary to C. difficile infection History of MAC s/p treatment April 2025, no active pneumonia  Cdiff antigen positivity but PCR  negative and toxin negative but with ongoing diarrhea but this is improved/resolved  CT scan of the chest did not show any definite infiltrate continue oral vancomycin  for C. difficile treatment given significant symptoms   COPD with chronic respiratory failure on home O2 at 2 L Tobacco use disorder Prn bronchodilators  Breo ellipta  daily  Antitussives, flutter valve and incentive spirometer Continue nicotine patch    History of Klebsiella UTI 09/11/2023 History of urinary retention requiring Foley 08/2023 UCx (+)80K EColi pending susceptibilities   Continue Foley Continue ceftriaxone     Physical deconditioning History of DSS referral July 2025 Continue PT OT TOC on board for SNF rehab placement   Frequent falls History of recurrent syncope History of hypotension/orthostatic hypotension requiring midodrine  Polypharmacy EF 60 to 65%, G1 DD 08/30/2023 Patient still with orthostatic hypotension Continue midodrine  Minimize sedating meds if possible - holding home BuSpar , cyclobenzaprine , gabapentin , hydroxyzine , morhpine, Xtampza    Hypokalemia Replace as needed Monitor BMP  Chronic pain Chronic prescription opioid use On PDMP review: home meds Xtampza  (extended release oxycodone ) 13.5 mg #90 x30 days, morphine  IR 15 mg #120 x30 days  Continue Cymbalta  Have held opiates d/t low BP holding home cyclobenzaprine , gabapentin , morhpine, Xtampza     Hypoalbuminemia due to protein-calorie malnutrition  Dietician consulted   Chronic diastolic heart failure  Clinically euvolemic to dry EF 60 to 65%, G1 DD 08/30/2023 Continue Jardiance .  Holding Lasix  in the setting of ongoing diarrhea needing IV fluid supplementation    Diabetes mellitus type 2, insulin  dependent Sliding scale insulin   coverage   GAD (generalized anxiety disorder)  Continue duloxetine  and Seroquel    Parkinson's disease  Continue Sinemet  Will hold gabapentin  d/t sedating effects    Capacity assessment -  borderline Patient can give details about her medical conditions and what brought her to the hospital. She is open to gong to rehab to get stronger and help prevent falls. She seems not to have a plan in place if family unable to care for her and she seems to not want to consider that possibility and plans to go back to live w/ her son in law once she is able to. She accepts that right now given falling it's not safe to go home to live alone  Patient has capacity at this time for reasonably uncomplicated decision-making but she demonstrates either some denial about possibility for certain undesired outcomes (living situation) or unwillingness to discuss such possibilities, so I question her capacity to fully engage with complex problems and will continue to evaluate       No concerns based on BMI: Body mass index is 22.49 kg/m.SABRA Significantly low or high BMI is associated with higher medical risk.  Underweight - under 18  overweight - 25 to 29 obese - 30 or more Class 1 obesity: BMI of 30.0 to 34 Class 2 obesity: BMI of 35.0 to 39 Class 3 obesity: BMI of 40.0 to 49 Super Morbid Obesity: BMI 50-59 Super-super Morbid Obesity: BMI 60+ Healthy nutrition and physical activity advised as adjunct to other disease management and risk reduction treatments    DVT prophylaxis: lovenox  IV fluids: no continuous IV fluids  Nutrition: carb modified diet Central lines / other devices: Foley   Code Status: FULL CODE ACP documentation reviewed: none on file in [facility]  TOC needs: SNF rehab placement Medical barriers to dispo: none.              Subjective / Brief ROS:  Patient reports feeling okay today I asked her to give me some details about why she is in the hospital and what the plan in once she leaves. She notes she has had falls at home, she lives w/ her son-in-law and plans to go back there eventually but is open to going to SNF rehab. I ask about plans if family can't take her back  home, she says we'll have words about that if it happens. Denies CP/SOB.  Pain controlled.  Denies new weakness.  Tolerating diet.  Reports no concerns w/ urination/defecation.   Family Communication: TOC discussing SNF options w/ granddaughter     Objective Findings:  Vitals:   09/24/23 2313 09/25/23 0348 09/25/23 0742 09/25/23 1206  BP: 107/65 109/61 118/61 138/83  Pulse: 89 79 79 87  Resp: 17 17 18 17   Temp: 97.9 F (36.6 C) 98.1 F (36.7 C) 98.5 F (36.9 C) 97.8 F (36.6 C)  TempSrc:      SpO2: 97% 98% 98% 100%  Weight:      Height:        Intake/Output Summary (Last 24 hours) at 09/25/2023 1425 Last data filed at 09/25/2023 1300 Gross per 24 hour  Intake 360 ml  Output 2840 ml  Net -2480 ml   Filed Weights   09/22/23 2022 09/22/23 2320  Weight: 65.8 kg 67.1 kg    Examination:  Physical Exam Constitutional:      General: She is not in acute distress. Cardiovascular:     Rate and Rhythm: Normal rate and regular rhythm.  Pulmonary:  Effort: Pulmonary effort is normal.     Breath sounds: Normal breath sounds.  Abdominal:     Palpations: Abdomen is soft.  Musculoskeletal:     Right lower leg: No edema.     Left lower leg: No edema.  Skin:    General: Skin is warm and dry.  Neurological:     Mental Status: She is alert and oriented to person, place, and time. Mental status is at baseline.  Psychiatric:        Mood and Affect: Mood normal.        Behavior: Behavior normal.          Scheduled Medications:   vitamin C   500 mg Oral BID   aspirin  EC  81 mg Oral Daily   carbidopa -levodopa   1.5 tablet Oral TID   cetirizine   10 mg Oral QPM   Chlorhexidine  Gluconate Cloth  6 each Topical Daily   DULoxetine   60 mg Oral Daily   empagliflozin   25 mg Oral QAC breakfast   enoxaparin  (LOVENOX ) injection  40 mg Subcutaneous Q24H   fluticasone  furoate-vilanterol  1 puff Inhalation Daily   insulin  aspart  0-5 Units Subcutaneous QHS   insulin  aspart  0-9  Units Subcutaneous TID WC   leptospermum manuka honey  1 Application Topical Daily   midodrine   5 mg Oral TID WC   multivitamin with minerals  1 tablet Oral Daily   mupirocin  ointment  1 Application Nasal BID   pantoprazole   40 mg Oral Daily   QUEtiapine   25 mg Oral QHS   vancomycin   125 mg Oral QID   zinc  sulfate (50mg  elemental zinc )  220 mg Oral Daily    Continuous Infusions:  azithromycin  500 mg (09/25/23 0058)   cefTRIAXone  (ROCEPHIN )  IV 2 g (09/24/23 2025)    PRN Medications:  acetaminophen  **OR** acetaminophen , colchicine , ipratropium-albuterol , ondansetron  **OR** ondansetron  (ZOFRAN ) IV, oxyCODONE   Antimicrobials from admission:  Anti-infectives (From admission, onward)    Start     Dose/Rate Route Frequency Ordered Stop   09/23/23 2100  cefTRIAXone  (ROCEPHIN ) 2 g in sodium chloride  0.9 % 100 mL IVPB        2 g 200 mL/hr over 30 Minutes Intravenous Every 24 hours 09/22/23 2333 09/27/23 2059   09/23/23 1800  vancomycin  (VANCOCIN ) capsule 125 mg        125 mg Oral 4 times daily 09/23/23 1704 10/03/23 1759   09/23/23 0030  azithromycin  (ZITHROMAX ) 500 mg in sodium chloride  0.9 % 250 mL IVPB        500 mg 250 mL/hr over 60 Minutes Intravenous Every 24 hours 09/22/23 2333 09/28/23 0029   09/22/23 2045  cefTRIAXone  (ROCEPHIN ) 2 g in sodium chloride  0.9 % 100 mL IVPB        2 g 200 mL/hr over 30 Minutes Intravenous Once 09/22/23 2030 09/22/23 2134   09/22/23 2045  vancomycin  (VANCOCIN ) IVPB 1000 mg/200 mL premix        1,000 mg 200 mL/hr over 60 Minutes Intravenous  Once 09/22/23 2030 09/22/23 2251           Data Reviewed:  I have personally reviewed the following...  CBC: Recent Labs  Lab 09/22/23 2024 09/23/23 0501 09/25/23 0329  WBC 5.8 4.9 5.9  NEUTROABS  --   --  4.0  HGB 9.3* 9.4* 9.2*  HCT 28.6* 28.8* 28.6*  MCV 100.7* 100.0 100.4*  PLT 328 312 290   Basic Metabolic Panel: Recent Labs  Lab 09/22/23 2024 09/23/23 0501  09/23/23 1133  09/24/23 0355 09/25/23 0329  NA 137 132*  --  136 135  K 3.0* 3.0* 3.4* 3.4* 2.8*  CL 106 104  --  110 108  CO2 22 22  --  22 22  GLUCOSE 91 107*  --  139* 115*  BUN 22 22  --  20 17  CREATININE 0.92 1.17*  --  1.12* 1.01*  CALCIUM  7.5* 7.2*  --  7.1* 7.1*  MG  --  1.8  --  2.2 1.7  PHOS  --   --   --  3.0  --    GFR: Estimated Creatinine Clearance: 57.5 mL/min (A) (by C-G formula based on SCr of 1.01 mg/dL (H)). Liver Function Tests: Recent Labs  Lab 09/22/23 2024 09/23/23 0501  AST 44* 42*  ALT 6 9  ALKPHOS 95 90  BILITOT 1.1 0.9  PROT 5.5* 4.9*  ALBUMIN 2.2* 1.9*   No results for input(s): LIPASE, AMYLASE in the last 168 hours. No results for input(s): AMMONIA in the last 168 hours. Coagulation Profile: Recent Labs  Lab 09/22/23 2024  INR 1.4*   Cardiac Enzymes: Recent Labs  Lab 09/22/23 2024  CKTOTAL 474*   BNP (last 3 results) No results for input(s): PROBNP in the last 8760 hours. HbA1C: No results for input(s): HGBA1C in the last 72 hours. CBG: Recent Labs  Lab 09/24/23 1123 09/24/23 1613 09/24/23 2043 09/25/23 0742 09/25/23 1207  GLUCAP 144* 105* 130* 87 122*   Lipid Profile: No results for input(s): CHOL, HDL, LDLCALC, TRIG, CHOLHDL, LDLDIRECT in the last 72 hours. Thyroid  Function Tests: No results for input(s): TSH, T4TOTAL, FREET4, T3FREE, THYROIDAB in the last 72 hours. Anemia Panel: No results for input(s): VITAMINB12, FOLATE, FERRITIN, TIBC, IRON, RETICCTPCT in the last 72 hours. Most Recent Urinalysis On File:     Component Value Date/Time   COLORURINE YELLOW (A) 09/23/2023 0915   APPEARANCEUR CLOUDY (A) 09/23/2023 0915   APPEARANCEUR Cloudy (A) 01/24/2015 0000   LABSPEC 1.012 09/23/2023 0915   LABSPEC 1.005 11/06/2012 2335   PHURINE 7.0 09/23/2023 0915   GLUCOSEU NEGATIVE 09/23/2023 0915   GLUCOSEU Negative 11/06/2012 2335   HGBUR SMALL (A) 09/23/2023 0915   BILIRUBINUR NEGATIVE  09/23/2023 0915   BILIRUBINUR Small 10/25/2021 1326   BILIRUBINUR Negative 01/24/2015 0000   BILIRUBINUR Negative 11/06/2012 2335   KETONESUR NEGATIVE 09/23/2023 0915   PROTEINUR 100 (A) 09/23/2023 0915   UROBILINOGEN 0.2 10/25/2021 1326   NITRITE NEGATIVE 09/23/2023 0915   LEUKOCYTESUR LARGE (A) 09/23/2023 0915   LEUKOCYTESUR 3+ 11/06/2012 2335   Sepsis Labs: @LABRCNTIP (procalcitonin:4,lacticidven:4) Microbiology: Recent Results (from the past 240 hours)  Blood Culture (routine x 2)     Status: None (Preliminary result)   Collection Time: 09/22/23  8:24 PM   Specimen: Right Antecubital; Blood  Result Value Ref Range Status   Specimen Description RIGHT ANTECUBITAL  Final   Special Requests   Final    BOTTLES DRAWN AEROBIC AND ANAEROBIC Blood Culture adequate volume   Culture   Final    NO GROWTH 3 DAYS Performed at [facility], [address], [place], [place] [address]    Report Status PENDING  Incomplete  Resp panel by RT-PCR (RSV, Flu A&B, Covid) Anterior Nasal Swab     Status: None   Collection Time: 09/22/23  8:39 PM   Specimen: Anterior Nasal Swab  Result Value Ref Range Status   SARS Coronavirus 2 by RT PCR NEGATIVE NEGATIVE Final    Comment: (NOTE)  SARS-CoV-2 target nucleic acids are NOT DETECTED.  The SARS-CoV-2 RNA is generally detectable in upper respiratory specimens during the acute phase of infection. The lowest concentration of SARS-CoV-2 viral copies this assay can detect is 138 copies/mL. A negative result does not preclude SARS-Cov-2 infection and should not be used as the sole basis for treatment or other patient management decisions. A negative result may occur with  improper specimen collection/handling, submission of specimen other than nasopharyngeal swab, presence of viral mutation(s) within the areas targeted by this assay, and inadequate number of viral copies(<138 copies/mL). A negative result must be combined with clinical  observations, patient history, and epidemiological information. The expected result is Negative.  Fact Sheet for Patients:  BloggerCourse.com  Fact Sheet for Healthcare Providers:  SeriousBroker.it  This test is no t yet approved or cleared by the [place]  FDA and  has been authorized for detection and/or diagnosis of SARS-CoV-2 by FDA under an Emergency Use Authorization (EUA). This EUA will remain  in effect (meaning this test can be used) for the duration of the COVID-19 declaration under Section 564(b)(1) of the Act, 21 U.S.C.section 360bbb-3(b)(1), unless the authorization is terminated  or revoked sooner.       Influenza A by PCR NEGATIVE NEGATIVE Final   Influenza B by PCR NEGATIVE NEGATIVE Final    Comment: (NOTE) The Xpert Xpress SARS-CoV-2/FLU/RSV plus assay is intended as an aid in the diagnosis of influenza from Nasopharyngeal swab specimens and should not be used as a sole basis for treatment. Nasal washings and aspirates are unacceptable for Xpert Xpress SARS-CoV-2/FLU/RSV testing.  Fact Sheet for Patients: BloggerCourse.com  Fact Sheet for Healthcare Providers: SeriousBroker.it  This test is not yet approved or cleared by the [place]  FDA and has been authorized for detection and/or diagnosis of SARS-CoV-2 by FDA under an Emergency Use Authorization (EUA). This EUA will remain in effect (meaning this test can be used) for the duration of the COVID-19 declaration under Section 564(b)(1) of the Act, 21 U.S.C. section 360bbb-3(b)(1), unless the authorization is terminated or revoked.     Resp Syncytial Virus by PCR NEGATIVE NEGATIVE Final    Comment: (NOTE) Fact Sheet for Patients: BloggerCourse.com  Fact Sheet for Healthcare Providers: SeriousBroker.it  This test is not yet approved or cleared by  the [place]  FDA and has been authorized for detection and/or diagnosis of SARS-CoV-2 by FDA under an Emergency Use Authorization (EUA). This EUA will remain in effect (meaning this test can be used) for the duration of the COVID-19 declaration under Section 564(b)(1) of the Act, 21 U.S.C. section 360bbb-3(b)(1), unless the authorization is terminated or revoked.  Performed at [facility], [address] [place], [place] [address]   Blood Culture (routine x 2)     Status: None (Preliminary result)   Collection Time: 09/22/23  8:39 PM   Specimen: BLOOD RIGHT ARM  Result Value Ref Range Status   Specimen Description BLOOD RIGHT ARM  Final   Special Requests   Final    BOTTLES DRAWN AEROBIC AND ANAEROBIC Blood Culture adequate volume   Culture   Final    NO GROWTH 3 DAYS Performed at [facility], [address], [place], [place] [address]    Report Status PENDING  Incomplete  C Difficile Quick Screen w PCR reflex     Status: Abnormal   Collection Time: 09/23/23  9:14 AM   Specimen: STOOL  Result Value Ref Range Status   C Diff antigen POSITIVE (A)  NEGATIVE Final   C Diff toxin NEGATIVE NEGATIVE Final   C Diff interpretation Results are indeterminate. See PCR results.  Final    Comment: Performed at [facility], [address] [place], [place] [address]  C. Diff by PCR, Reflexed     Status: None   Collection Time: 09/23/23  9:14 AM  Result Value Ref Range Status   Toxigenic C. Difficile by PCR NEGATIVE NEGATIVE Final    Comment: Patient is colonized with non toxigenic C. difficile. May not need treatment unless significant symptoms are present.   Hypervirulent Strain PRESUMPTIVE NEGATIVE PRESUMPTIVE NEGATIVE Final    Comment: Performed at [facility], [address] [place], [place] [address]  Urine Culture     Status: Abnormal (Preliminary result)   Collection Time: 09/23/23  9:15 AM   Specimen: Urine, Random  Result Value Ref  Range Status   Specimen Description   Final    URINE, RANDOM Performed at [facility], [address] [place], [place] [address]    Special Requests   Final    NONE Reflexed from [id number] Performed at [facility], [address] [place], [place] [address]    Culture (A)  Final    80,000 COLONIES/mL ESCHERICHIA COLI SUSCEPTIBILITIES TO FOLLOW Performed at [facility], [address] [place], [place] [address]    Report Status PENDING  Incomplete      Radiology Studies last 3 days: CT CHEST WO CONTRAST Result Date: 09/23/2023 CLINICAL DATA:  History of fall altered mental status, question interstitial lung disease EXAM: CT CHEST WITHOUT CONTRAST TECHNIQUE: Multidetector CT imaging of the chest was performed following the standard protocol without IV contrast. RADIATION DOSE REDUCTION: This exam was performed according to the departmental dose-optimization program which includes automated exposure control, adjustment of the mA and/or kV according to patient size and/or use of iterative reconstruction technique. COMPARISON:  Chest x-ray 09/22/2023, chest CT 08/22/2022, 05/16/2022, 09/10/2021 FINDINGS: Cardiovascular: Limited evaluation without intravenous contrast. Moderate aortic atherosclerosis. No aneurysm. Coronary vascular calcification. Normal cardiac size. Trace pericardial effusion Mediastinum/Nodes: Patent trachea. Mildly prominent precarinal lymph node measuring 10 mm. Hypodense right thyroid  nodule measuring 2.6 cm This has been evaluated on previous imaging. (ref: J Am Coll Radiol. 2015 Feb;12(2): 143-50). Esophagus within normal limits. Lungs/Pleura: Emphysema. No pleural effusion or pneumothorax. Interstitial densities and mild reticulation within the upper lobes and right middle lobe suggestive of post infectious or post inflammatory scarring. No acute consolidative airspace disease. 3 mm right upper lobe pulmonary nodule on series 3, image 48, not clearly  seen on the prior exam. Upper Abdomen: Hepatic steatosis. Incompletely visualized new moderate severe bilateral hydronephrosis. Multiple gallstones. Musculoskeletal: No acute or suspicious osseous abnormality IMPRESSION: 1. Emphysema with interstitial densities and mild reticulation within the upper lobes and right middle lobe suggestive of post infectious or post inflammatory scarring. No acute consolidative airspace disease. 2. 3 mm right upper lobe pulmonary nodule, not clearly seen on the prior exam. No follow-up needed if patient is low-risk.This recommendation follows the consensus statement: Guidelines for Management of Incidental Pulmonary Nodules Detected on CT Images: From the Fleischner Society 2017; Radiology 2017; 284:228-243. 3. 4. Hepatic steatosis. 5. Cholelithiasis. 6. Aortic atherosclerosis. Aortic Atherosclerosis (ICD10-I70.0) and Emphysema (ICD10-J43.9). Electronically Signed   By: Luke Bun M.D.   On: 09/23/2023 00:28   DG Chest Port 1 View Result Date: 09/22/2023 EXAM: 1 VIEW XRAY OF THE CHEST 09/22/2023 08:55:28 PM COMPARISON: 09/08/2021 and 08/22/2022. CLINICAL HISTORY: Questionable sepsis -  evaluate for abnormality. PER ER NOTE; Patient brought in via [facility] today from home with complaints of fall and altered mental status. Patient fell down around 7am this morning and has been in the floor since then as she was unable to get herself up off the floor. Husband and son ; in law waited to call 911 until tonight. Presents with lac to forehead which she states happened days ago. EMS also reports she has bed sore on her back that appears infected. FINDINGS: LUNGS AND PLEURA: Patchy airspace opacities in the right mid and lower lung may be due to atelectasis or infiltrates. Consider CT for further evaluation. No pleural effusion or pneumothorax. HEART AND MEDIASTINUM: Stable cardiomediastinal silhouette. BONES AND SOFT TISSUES: No acute osseous abnormality. IMPRESSION: 1. Patchy  airspace opacities in the right mid and lower lung, possibly due to atelectasis or infiltrates. Consider CT for further evaluation. 2. No pleural effusion or pneumothorax. Electronically signed by: Norman Gatlin MD 09/22/2023 09:09 PM EDT RP Workstation: [id number]        Laneta Blunt, DO [facility] 09/25/2023, 2:25 PM    Dictation software may have been used to generate the above note. Typos may occur and escape review in typed/dictated notes. Please contact Dr Blunt directly for clarity if needed.  Staff may message me via secure chat in Epic  but this may not receive an immediate response,  please page me for urgent matters!  If 7PM-7AM, please contact night coverage [email or web address]

## 2023-09-25 NOTE — TOC Progression Note (Signed)
 Transition of Care [facility]) - Progression Note    Patient Details  Name: Tricia [name] MRN: [id number] Date of Birth: [birth year]  [facility] CM/SW Contact  Dalia GORMAN Fuse, RN Phone Number: 09/25/2023, 1:38 PM  Clinical Narrative:    Patient with bed offers from [facility] [place] and [facility] in [place], [place]. TOC outreached to the patient's granddaughter Norlene [phone] to share bed offers. She is discussing the bed offers with her family and will call TOC back.   Expected Discharge Plan: Skilled Nursing Facility Barriers to Discharge: Continued Medical Work up               Expected Discharge Plan and Services   Discharge Planning Services: CM Consult   Living arrangements for the past 2 months: Single Family Home                                       Social Drivers of Health (SDOH) Interventions SDOH Screenings   Food Insecurity: No Food Insecurity (09/22/2023)  Housing: Low Risk  (09/22/2023)  Transportation Needs: No Transportation Needs (09/22/2023)  Utilities: Not At Risk (09/22/2023)  Alcohol Screen: Low Risk  (03/07/2023)  Depression (PHQ2-9): High Risk (05/01/2023)  Financial Resource Strain: Patient Declined (06/18/2023)   Received from [facility]  Physical Activity: Sufficiently Active (03/07/2023)  Social Connections: Socially Isolated (08/29/2023)  Stress: No Stress Concern Present (03/07/2023)  Tobacco Use: High Risk (08/28/2023)  Health Literacy: Adequate Health Literacy (03/07/2023)    Readmission Risk Interventions     No data to display

## 2023-09-25 NOTE — Hospital Course (Addendum)
 Hospital course / significant events:   HPI: Tricia [name] is a 63 y.o. 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 She had no loss of consciousness and reports no injury. Patient's recent hospital stay was complicated by urinary retention requiring Foley and a DSS referral due to concerns for inadequate care at home.  She also had episodes of hypotension treated with midodrine .   08/03: to ED, tachycardic to 101 and febrile to 100.1, BP 111/66. Admitted to hospitalist for SIRS concern possible sepsis, procalcitonin 0.6 but CT chest neg for PNA.  08/04: continuing Foley and awaiting cultures. PT/OT recs for SNF rehab 08/05: SNF placement in process. Of note, family considering for DSS guardianship as they are not able/willing to care for patient. TOC spoke w/ granddaugther, pt states she lives w/ son in law (her daughter is deceased).  September 29, 2023: Pt is on room air. Pending SNF choice by family.  08/07: pt requests Foley out, void trial today. SNF rehab still pending.  08/08-08/10: placement pending thru the weekend      Consultants:  none  Procedures/Surgeries: none      ASSESSMENT & PLAN:   Sepsis secondary to acute diarrhea likely secondary to C. difficile infection History of MAC s/p treatment April 2025, no active pneumonia  Cdiff antigen positivity but PCR negative and toxin negative but with ongoing diarrhea but this is  improved/resolved  CT scan of the chest did not show any definite infiltrate continue oral vancomycin  for C. difficile treatment given significant symptoms   COPD with chronic respiratory failure on home O2 at 2 L Tobacco use disorder Have reasonably r/o pneumonia Prn bronchodilators  Breo ellipta  daily  Antitussives, flutter valve and incentive spirometer Continue nicotine patch    History of Klebsiella UTI 09/11/2023 History of urinary retention requiring Foley 08/2023 UCx (+)80K EColi pan sensitive  dc Foley and void trial successful Transition to po abx to finish course    Physical deconditioning History of DSS referral July 2025 Continue PT OT TOC on board for SNF rehab placement   Frequent falls History of recurrent syncope History of hypotension/orthostatic hypotension requiring midodrine  Polypharmacy EF 60 to 65%, G1 DD 08/30/2023 Patient still with orthostatic hypotension Continue midodrine  Minimize sedating meds if possible - holding home BuSpar , cyclobenzaprine , gabapentin , hydroxyzine , morhpine, Xtampza    Hypokalemia Replace as needed Monitor BMP  Chronic pain Chronic prescription opioid use On PDMP review: home meds Xtampza  (extended release oxycodone ) 13.5 mg #90 x30 days, morphine  IR 15 mg #120 x30 days  Continue Cymbalta  Have held high dose opiates d/t low BP and pt has not complained of uncontrolled pain holding home cyclobenzaprine , gabapentin , morhpine, Xtampza     Hypoalbuminemia due to protein-calorie malnutrition  Dietician consulted   Chronic diastolic heart failure  Clinically euvolemic to dry EF 60 to 65%, G1 DD 08/30/2023 Continue Jardiance .  Holding Lasix  in the setting of ongoing diarrhea needing IV fluid supplementation    Diabetes mellitus type 2, insulin   dependent Sliding scale insulin  coverage   GAD (generalized anxiety disorder)  Continue duloxetine  and Seroquel    Parkinson's disease  Continue Sinemet  Will hold gabapentin  d/t  sedating effects    Capacity assessment - borderline Patient is oriented to person/place/time/situation. She can give details about her medical conditions and what brought her to the hospital. She is open to gong to rehab to get stronger and help prevent falls.  Patient has capacity at this time for reasonably uncomplicated decision-making but she demonstrates either some denial about possibility for certain undesired outcomes (living situation) or unwillingness to discuss such possibilities, she also is needing multiple explanations for delay in discharge but she is asking us  all about ways around it and I suspect she is fishing for a satisfactory answer rather than true misunderstanding. I question her capacity to fully engage with complex problems and will continue to evaluate.       No concerns based on BMI: Body mass index is 22.49 kg/m.SABRA Significantly low or high BMI is associated with higher medical risk.  Underweight - under 18  overweight - 25 to 29 obese - 30 or more Class 1 obesity: BMI of 30.0 to 34 Class 2 obesity: BMI of 35.0 to 39 Class 3 obesity: BMI of 40.0 to 49 Super Morbid Obesity: BMI 50-59 Super-super Morbid Obesity: BMI 60+ Healthy nutrition and physical activity advised as adjunct to other disease management and risk reduction treatments    DVT prophylaxis: lovenox  IV fluids: no continuous IV fluids  Nutrition: carb modified diet Central lines / other devices: Foley   Code Status: FULL CODE ACP documentation reviewed: none on file in [facility]  TOC needs: SNF rehab placement Medical barriers to dispo: none.

## 2023-09-25 NOTE — Plan of Care (Signed)

## 2023-09-25 NOTE — Plan of Care (Signed)

## 2023-09-25 NOTE — Progress Notes (Signed)
 PHARMACY CONSULT NOTE - ELECTROLYTES  Pharmacy Consult for Electrolyte Monitoring and Replacement   Recent Labs: Height: 5' 8 (172.7 cm) Weight: 67.1 kg (147 lb 14.9 oz) IBW/kg (Calculated) : 63.9 Estimated Creatinine Clearance: 57.5 mL/min (A) (by C-G formula based on SCr of 1.01 mg/dL (H)). Potassium (mmol/L)  Date Value  09/25/2023 2.8 (L)  11/16/2013 4.0   Magnesium  (mg/dL)  Date Value  91/93/7974 1.7   Calcium  (mg/dL)  Date Value  91/93/7974 7.1 (L)   Calcium , Total (mg/dL)  Date Value  90/71/7984 7.9 (L)   Albumin (g/dL)  Date Value  91/95/7974 1.9 (L)  07/06/2015 4.2  10/07/2012 2.9 (L)   Phosphorus (mg/dL)  Date Value  91/94/7974 3.0   Sodium (mmol/L)  Date Value  09/25/2023 135  07/06/2015 146 (H)  11/16/2013 131 (L)   Corrected Ca: 8.8 mg/dL  Assessment  Tricia [name] is a 63 y.o. female presenting after mechanical fall at home with unknown downtime. PMH significant for HFpEF(EF 60 to 65%, G1 DD 08/30/2023), type II IDDM, HTN, GAD, Parkinson's, chronic pain on chronic opiates, tobacco use disorder COPD, stage II on home O2 at 2 L. Pharmacy has been consulted to monitor and replace electrolytes.  Diet: Regular, carb modified MIVF: None Pertinent medications: N/A  Goal of Therapy: Electrolytes WNL  Plan:  K 2.8 Placed order for x1 Kcl total this AM: 40mEq PO + 20mEq IV, per protocol Mg 1.7 Placed order for 2g Mg IV x1, per protocol Check BMP, Mg, Phos with AM labs  Thank you for allowing pharmacy to be a part of this patient's care.  Leonor JAYSON Argyle, PharmD Clinical Pharmacist 09/25/2023 7:37 AM

## 2023-09-26 ENCOUNTER — Encounter: Payer: Self-pay | Admitting: Internal Medicine

## 2023-09-26 DIAGNOSIS — R6510 Systemic inflammatory response syndrome (SIRS) of non-infectious origin without acute organ dysfunction: Secondary | ICD-10-CM | POA: Diagnosis not present

## 2023-09-26 LAB — URINE CULTURE: Culture: 80000 — AB

## 2023-09-26 LAB — BASIC METABOLIC PANEL WITH GFR
Anion gap: 6 (ref 5–15)
BUN: 15 mg/dL (ref 8–23)
CO2: 23 mmol/L (ref 22–32)
Calcium: 7.4 mg/dL — ABNORMAL LOW (ref 8.9–10.3)
Chloride: 108 mmol/L (ref 98–111)
Creatinine, Ser: 0.99 mg/dL (ref 0.44–1.00)
GFR, Estimated: >60 mL/min (ref >60)
Glucose, Bld: 88 mg/dL (ref 70–99)
Potassium: 3.6 mmol/L (ref 3.5–5.1)
Sodium: 137 mmol/L (ref 135–145)

## 2023-09-26 LAB — GLUCOSE, CAPILLARY
Glucose-Capillary: 78 mg/dL (ref 70–99)
Glucose-Capillary: 98 mg/dL (ref 70–99)
Glucose-Capillary: 113 mg/dL — ABNORMAL HIGH (ref 70–99)
Glucose-Capillary: 151 mg/dL — ABNORMAL HIGH (ref 70–99)

## 2023-09-26 LAB — MAGNESIUM: Magnesium: 2 mg/dL (ref 1.7–2.4)

## 2023-09-26 LAB — PHOSPHORUS: Phosphorus: 3.8 mg/dL (ref 2.5–4.6)

## 2023-09-26 MED ORDER — GUAIFENESIN ER 600 MG PO TB12
600.0000 mg | ORAL_TABLET | Freq: Two times a day (BID) | ORAL | Status: DC
  Administered 2023-09-26 – 2023-09-30 (×10): 600 mg via ORAL
  Filled 2023-09-26 (×10): qty 1

## 2023-09-26 MED ORDER — ROSUVASTATIN CALCIUM 10 MG PO TABS
5.0000 mg | ORAL_TABLET | Freq: Every day | ORAL | Status: DC
  Administered 2023-09-26 – 2023-09-29 (×4): 5 mg via ORAL
  Filled 2023-09-26 (×4): qty 1

## 2023-09-26 MED ORDER — CARBIDOPA-LEVODOPA ER 50-200 MG PO TBCR
1.0000 | EXTENDED_RELEASE_TABLET | Freq: Every day | ORAL | Status: DC
  Administered 2023-09-27 – 2023-09-29 (×4): 1 via ORAL
  Filled 2023-09-26 (×5): qty 1

## 2023-09-26 MED ORDER — DIPHENOXYLATE-ATROPINE 2.5-0.025 MG PO TABS
2.0000 | ORAL_TABLET | Freq: Four times a day (QID) | ORAL | Status: DC | PRN
  Administered 2023-09-27 – 2023-09-29 (×4): 2 via ORAL
  Filled 2023-09-26 (×5): qty 2

## 2023-09-26 MED ORDER — CEFADROXIL 500 MG PO CAPS
500.0000 mg | ORAL_CAPSULE | Freq: Two times a day (BID) | ORAL | Status: AC
  Administered 2023-09-26 (×2): 500 mg via ORAL
  Filled 2023-09-26 (×3): qty 1

## 2023-09-26 MED ORDER — HYDROCOD POLI-CHLORPHE POLI ER 10-8 MG/5ML PO SUER
5.0000 mL | Freq: Two times a day (BID) | ORAL | Status: DC | PRN
  Administered 2023-09-26: 5 mL via ORAL
  Filled 2023-09-26: qty 5

## 2023-09-26 MED ORDER — LOPERAMIDE HCL 2 MG PO CAPS
4.0000 mg | ORAL_CAPSULE | ORAL | Status: DC | PRN
  Administered 2023-09-26 (×3): 4 mg via ORAL
  Filled 2023-09-26 (×3): qty 2

## 2023-09-26 MED ORDER — ALUM & MAG HYDROXIDE-SIMETH 200-200-20 MG/5ML PO SUSP
30.0000 mL | Freq: Once | ORAL | Status: AC
  Administered 2023-09-26: 30 mL via ORAL
  Filled 2023-09-26: qty 30

## 2023-09-26 NOTE — Plan of Care (Signed)
  Problem: Education: Goal: Knowledge of General Education information will improve Description: Including pain rating scale, medication(s)/side effects and non-pharmacologic comfort measures Outcome: Progressing   Problem: Health Behavior/Discharge Planning: Goal: Ability to manage health-related needs will improve Outcome: Progressing   Problem: Nutritional: Goal: Maintenance of adequate nutrition will improve Outcome: Progressing   Problem: Clinical Measurements: Goal: Signs and symptoms of infection will decrease Outcome: Progressing   Problem: Respiratory: Goal: Ability to maintain adequate ventilation will improve Outcome: Progressing

## 2023-09-26 NOTE — Progress Notes (Signed)
 Patient's family member delivered [employer] cans from home to patient. Patient educated on the sugar content and encouraged to drink diet cola. Patient refusing to drink diet beverages or unsweetened beverages.

## 2023-09-26 NOTE — Progress Notes (Signed)
 PHARMACY CONSULT NOTE - ELECTROLYTES  Pharmacy Consult for Electrolyte Monitoring and Replacement   Recent Labs: Height: 5' 8 (172.7 cm) Weight: 67.1 kg (147 lb 14.9 oz) IBW/kg (Calculated) : 63.9 Estimated Creatinine Clearance: 58.7 mL/min (by C-G formula based on SCr of 0.99 mg/dL). Potassium (mmol/L)  Date Value  09/26/2023 3.6  11/16/2013 4.0   Magnesium  (mg/dL)  Date Value  91/92/7974 2.0   Calcium  (mg/dL)  Date Value  91/92/7974 7.4 (L)   Calcium , Total (mg/dL)  Date Value  90/71/7984 7.9 (L)   Albumin (g/dL)  Date Value  91/95/7974 1.9 (L)  07/06/2015 4.2  10/07/2012 2.9 (L)   Phosphorus (mg/dL)  Date Value  91/92/7974 3.8   Sodium (mmol/L)  Date Value  09/26/2023 137  07/06/2015 146 (H)  11/16/2013 131 (L)   Corrected Ca: 9.1 mg/dL  Assessment  Tricia [name] is a 63 y.o. female presenting after mechanical fall at home with unknown downtime. PMH significant for HFpEF(EF 60 to 65%, G1 DD 08/30/2023), type II IDDM, HTN, GAD, Parkinson's, chronic pain on chronic opiates, tobacco use disorder COPD, stage II on home O2 at 2 L. Pharmacy has been consulted to monitor and replace electrolytes.  Diet: Regular, carb modified MIVF: None Pertinent medications: N/A  Goal of Therapy: Electrolytes WNL  Plan:  K 3.6, Mg 2, Phos 3.8 K, Mg, Phos within normal limits. No replacement ordered this AM. BMP ordered for tomorrow 8/8 AM  Thank you for allowing pharmacy to be a part of this patient's care.  Leonor JAYSON Argyle, PharmD Clinical Pharmacist 09/26/2023 7:27 AM

## 2023-09-26 NOTE — Progress Notes (Signed)
 Foley catheter removed at 1420.

## 2023-09-26 NOTE — Care Management Important Message (Signed)
 Important Message  Patient Details  Name: Tricia [name] MRN: [id number] Date of Birth: 06-Apr-1960   Important Message Given:  Yes - Medicare IM     Alan Riles W, CMA 09/26/2023, 10:18 AM

## 2023-09-26 NOTE — TOC Progression Note (Signed)
 Transition of Care [facility]) - Progression Note    Patient Details  Name: Tricia [name] MRN: [id number] Date of Birth: 03/31/1960  [facility]) CM/SW Contact  Dalia GORMAN Fuse, RN Phone Number: 09/26/2023, 11:33 AM  Clinical Narrative:    TOC spoke with the patient's granddaughter Norlene, they choose [facility]. They are planning to tour the facility this afternoon. TOC outreached to Tammy at [facility] to let them know the family accepted. Nitchia with TOC will start ins auth.  TOC will continue to follow.   Expected Discharge Plan: Skilled Nursing Facility Barriers to Discharge: Continued Medical Work up               Expected Discharge Plan and Services   Discharge Planning Services: CM Consult   Living arrangements for the past 2 months: Single Family Home                                       Social Drivers of Health (SDOH) Interventions SDOH Screenings   Food Insecurity: No Food Insecurity (09/22/2023)  Housing: Low Risk  (09/22/2023)  Transportation Needs: No Transportation Needs (09/22/2023)  Utilities: Not At Risk (09/22/2023)  Alcohol Screen: Low Risk  (03/07/2023)  Depression (PHQ2-9): High Risk (05/01/2023)  Financial Resource Strain: Patient Declined (06/18/2023)   Received from [facility]  Physical Activity: Sufficiently Active (03/07/2023)  Social Connections: Socially Isolated (08/29/2023)  Stress: No Stress Concern Present (03/07/2023)  Tobacco Use: High Risk (08/28/2023)  Health Literacy: Adequate Health Literacy (03/07/2023)    Readmission Risk Interventions     No data to display

## 2023-09-26 NOTE — Plan of Care (Signed)

## 2023-09-26 NOTE — Progress Notes (Signed)
 PROGRESS NOTE    Tricia [name]   FMW:[id number] DOB: 1960-10-12  DOA: 09/22/2023 Date of Service: 09/26/23 which is hospital day 3  PCP: Sowles, Krichna, MD    Hospital course / significant events:   HPI: Tricia [name] is a 63 y.o. 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 She had no loss of consciousness and reports no injury. Patient's recent hospital stay was complicated by urinary retention requiring Foley and a DSS referral due to concerns for inadequate care at home.  She also had episodes of hypotension treated with midodrine .   08/03: to ED, tachycardic to 101 and febrile to 100.1, BP 111/66. Admitted to hospitalist for SIRS concern possible sepsis, procalcitonin 0.6 but CT chest neg for PNA.  08/04: continuing Foley and awaiting cultures. PT/OT recs for SNF rehab 08/05: SNF placement in process. Of note, family considering for DSS guardianship as they are not able/willing to care for patient. TOC spoke w/ granddaugther, pt states she lives w/ son in law (her daughter is deceased).  Oct 05, 2023: Pt is on room air. Pending SNF choice by family.  08/07: pt requests Foley out, void trial today. SNF rehab still pending.      Consultants:  none  Procedures/Surgeries: none      ASSESSMENT & PLAN:   Sepsis secondary to acute diarrhea likely secondary to C. 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 difficile treatment given significant symptoms   COPD with chronic respiratory failure on home O2 at 2 L Tobacco use disorder Have reasonably r/o pneumonia Prn bronchodilators  Breo ellipta  daily  Antitussives, flutter valve and incentive spirometer Continue nicotine patch    History of Klebsiella UTI 09/11/2023 History of urinary retention requiring Foley 08/2023 UCx (+)80K EColi pending susceptibilities   dc Foley and void trial today Transition to po abx   Physical deconditioning History of DSS referral July 2025 Continue PT OT TOC on board for SNF rehab placement   Frequent falls History of recurrent syncope History of hypotension/orthostatic hypotension requiring midodrine  Polypharmacy EF 60 to 65%, G1 DD 08/30/2023 Patient still with orthostatic hypotension Continue midodrine  Minimize sedating meds if possible - holding home BuSpar , cyclobenzaprine , gabapentin , hydroxyzine , morhpine, Xtampza    Hypokalemia Replace as needed Monitor BMP  Chronic pain Chronic prescription opioid use On PDMP review: home meds Xtampza  (extended release oxycodone ) 13.5 mg #90 x30 days, morphine  IR 15 mg #120 x30 days  Continue Cymbalta  Have held opiates d/t low BP and pt has not complained of pain holding home cyclobenzaprine , gabapentin , morhpine, Xtampza     Hypoalbuminemia due to protein-calorie malnutrition  Dietician consulted   Chronic diastolic heart failure  Clinically euvolemic to dry EF 60 to 65%, G1 DD 08/30/2023 Continue Jardiance .  Holding Lasix  in the setting of ongoing diarrhea needing IV fluid supplementation    Diabetes mellitus type 2, insulin  dependent Sliding scale insulin  coverage   GAD (generalized anxiety disorder)  Continue duloxetine   and Seroquel    Parkinson's disease  Continue Sinemet  Will hold gabapentin  d/t sedating effects    Capacity assessment - borderline Patient is oriented to person/place/time/situation. She can give details about her medical conditions and what brought her to the hospital. She is open to gong to rehab to get stronger and help prevent falls.  She does not have a plan in place if family unable to care for her, and she seems to not want to consider or discuss that possibility. She plans to go back to live w/ her son in law once she is able to. She accepts that right now given falling it's not safe to go home to live alone.  Her greatest concern is for her dogs, she is suspicious that her granddaughter is lying to her about having made calls about arranging foster homes for the dogs and se is very fixated on whether she can bring the dogs with her to rehab.  Patient has capacity at this time for reasonably uncomplicated decision-making but she demonstrates either some denial about possibility for certain undesired outcomes (living situation) or unwillingness to discuss such possibilities, so I question her capacity to fully engage with complex problems and will continue to evaluate.       No concerns based on BMI: Body mass index is 22.49 kg/m.SABRA Significantly low or high BMI is associated with higher medical risk.  Underweight - under 18  overweight - 25 to 29 obese - 30 or more Class 1 obesity: BMI of 30.0 to 34 Class 2 obesity: BMI of 35.0 to 39 Class 3 obesity: BMI of 40.0 to 49 Super Morbid Obesity: BMI 50-59 Super-super Morbid Obesity: BMI 60+ Healthy nutrition and physical activity advised as adjunct to other disease management and risk reduction treatments    DVT prophylaxis: lovenox  IV fluids: no continuous IV fluids  Nutrition: carb modified diet Central lines / other devices: Foley   Code Status: FULL CODE ACP documentation reviewed: none on file in [facility]  TOC needs: SNF rehab  placement Medical barriers to dispo: none.              Subjective / Brief ROS:  Patient reports feeling okay today She has a lot of concerns about her dogs and is suspicious that her granddaughter is not telling her the truth about not being able to find foster homes for them.  Denies CP/SOB.  Pain controlled.  Denies new weakness.  Tolerating diet.  Reports diarrhea today   Family Communication: TOC discussing SNF options w/ granddaughter. Pt has asked me not to call family to update them today     Objective Findings:  Vitals:   09/25/23 1931 09/26/23 0044 09/26/23 0530 09/26/23 0745  BP: 121/77 106/65 132/74 128/70  Pulse: 82 78 80 85  Resp: 16 16 18 16   Temp: 98 F (36.7 C) 97.6 F (36.4 C) 98.7 F (37.1 C) 98.5 F (36.9 C)  TempSrc:      SpO2: 99% 99% 97% 100%  Weight:      Height:        Intake/Output Summary (Last 24 hours) at 09/26/2023 1107 Last data filed at 09/26/2023 0156 Gross per 24 hour  Intake 120 ml  Output 1100 ml  Net -980 ml   Filed Weights   09/22/23 2022 09/22/23  2320  Weight: 65.8 kg 67.1 kg    Examination:  Physical Exam Constitutional:      General: She is not in acute distress. Cardiovascular:     Rate and Rhythm: Normal rate and regular rhythm.  Pulmonary:     Effort: Pulmonary effort is normal.     Breath sounds: Normal breath sounds.  Abdominal:     Palpations: Abdomen is soft.  Musculoskeletal:     Right lower leg: No edema.     Left lower leg: No edema.  Skin:    General: Skin is warm and dry.  Neurological:     Mental Status: She is alert and oriented to person, place, and time. Mental status is at baseline.  Psychiatric:        Mood and Affect: Mood normal.        Behavior: Behavior normal.          Scheduled Medications:   vitamin C   500 mg Oral BID   aspirin  EC  81 mg Oral Daily   carbidopa -levodopa   1 tablet Oral QHS   carbidopa -levodopa   1.5 tablet Oral TID   cefadroxil   500 mg Oral BID    cetirizine   10 mg Oral QPM   Chlorhexidine  Gluconate Cloth  6 each Topical Daily   DULoxetine   60 mg Oral Daily   empagliflozin   25 mg Oral QAC breakfast   enoxaparin  (LOVENOX ) injection  40 mg Subcutaneous Q24H   fluticasone  furoate-vilanterol  1 puff Inhalation Daily   guaiFENesin   600 mg Oral BID   insulin  aspart  0-5 Units Subcutaneous QHS   insulin  aspart  0-9 Units Subcutaneous TID WC   leptospermum manuka honey  1 Application Topical Daily   midodrine   5 mg Oral TID WC   multivitamin with minerals  1 tablet Oral Daily   mupirocin  ointment  1 Application Nasal BID   pantoprazole   40 mg Oral Daily   QUEtiapine   25 mg Oral QHS   rosuvastatin   5 mg Oral QHS   vancomycin   125 mg Oral QID   zinc  sulfate (50mg  elemental zinc )  220 mg Oral Daily    Continuous Infusions:    PRN Medications:  acetaminophen  **OR** acetaminophen , chlorpheniramine-HYDROcodone, colchicine , ipratropium-albuterol , loperamide , ondansetron  **OR** ondansetron  (ZOFRAN ) IV, oxyCODONE   Antimicrobials from admission:  Anti-infectives (From admission, onward)    Start     Dose/Rate Route Frequency Ordered Stop   09/26/23 1200  cefadroxil  (DURICEF) capsule 500 mg        500 mg Oral 2 times daily 09/26/23 1036 09/27/23 0959   09/23/23 2100  cefTRIAXone  (ROCEPHIN ) 2 g in sodium chloride  0.9 % 100 mL IVPB  Status:  Discontinued        2 g 200 mL/hr over 30 Minutes Intravenous Every 24 hours 09/22/23 2333 09/26/23 1036   09/23/23 1800  vancomycin  (VANCOCIN ) capsule 125 mg        125 mg Oral 4 times daily 09/23/23 1704 10/03/23 1759   09/23/23 0030  azithromycin  (ZITHROMAX ) 500 mg in sodium chloride  0.9 % 250 mL IVPB  Status:  Discontinued        500 mg 250 mL/hr over 60 Minutes Intravenous Every 24 hours 09/22/23 2333 09/26/23 1036   09/22/23 2045  cefTRIAXone  (ROCEPHIN ) 2 g in sodium chloride  0.9 % 100 mL IVPB        2 g 200 mL/hr over 30 Minutes Intravenous Once 09/22/23 2030 09/22/23 2134   09/22/23 2045   vancomycin  (VANCOCIN ) IVPB 1000 mg/200 mL  premix        1,000 mg 200 mL/hr over 60 Minutes Intravenous  Once 09/22/23 2030 09/22/23 2251           Data Reviewed:  I have personally reviewed the following...  CBC: Recent Labs  Lab 09/22/23 2024 09/23/23 0501 09/25/23 0329  WBC 5.8 4.9 5.9  NEUTROABS  --   --  4.0  HGB 9.3* 9.4* 9.2*  HCT 28.6* 28.8* 28.6*  MCV 100.7* 100.0 100.4*  PLT 328 312 290   Basic Metabolic Panel: Recent Labs  Lab 09/22/23 2024 09/23/23 0501 09/23/23 1133 09/24/23 0355 09/25/23 0329 09/26/23 0318  NA 137 132*  --  136 135 137  K 3.0* 3.0* 3.4* 3.4* 2.8* 3.6  CL 106 104  --  110 108 108  CO2 22 22  --  22 22 23   GLUCOSE 91 107*  --  139* 115* 88  BUN 22 22  --  20 17 15   CREATININE 0.92 1.17*  --  1.12* 1.01* 0.99  CALCIUM  7.5* 7.2*  --  7.1* 7.1* 7.4*  MG  --  1.8  --  2.2 1.7 2.0  PHOS  --   --   --  3.0  --  3.8   GFR: Estimated Creatinine Clearance: 58.7 mL/min (by C-G formula based on SCr of 0.99 mg/dL). Liver Function Tests: Recent Labs  Lab 09/22/23 2024 09/23/23 0501  AST 44* 42*  ALT 6 9  ALKPHOS 95 90  BILITOT 1.1 0.9  PROT 5.5* 4.9*  ALBUMIN 2.2* 1.9*   No results for input(s): LIPASE, AMYLASE in the last 168 hours. No results for input(s): AMMONIA in the last 168 hours. Coagulation Profile: Recent Labs  Lab 09/22/23 2024  INR 1.4*   Cardiac Enzymes: Recent Labs  Lab 09/22/23 2024  CKTOTAL 474*   BNP (last 3 results) No results for input(s): PROBNP in the last 8760 hours. HbA1C: No results for input(s): HGBA1C in the last 72 hours. CBG: Recent Labs  Lab 09/25/23 0742 09/25/23 1207 09/25/23 1627 09/25/23 2004 09/26/23 0746  GLUCAP 87 122* 79 131* 78   Lipid Profile: No results for input(s): CHOL, HDL, LDLCALC, TRIG, CHOLHDL, LDLDIRECT in the last 72 hours. Thyroid  Function Tests: No results for input(s): TSH, T4TOTAL, FREET4, T3FREE, THYROIDAB in the last 72  hours. Anemia Panel: No results for input(s): VITAMINB12, FOLATE, FERRITIN, TIBC, IRON, RETICCTPCT in the last 72 hours. Most Recent Urinalysis On File:     Component Value Date/Time   COLORURINE YELLOW (A) 09/23/2023 0915   APPEARANCEUR CLOUDY (A) 09/23/2023 0915   APPEARANCEUR Cloudy (A) 01/24/2015 0000   LABSPEC 1.012 09/23/2023 0915   LABSPEC 1.005 11/06/2012 2335   PHURINE 7.0 09/23/2023 0915   GLUCOSEU NEGATIVE 09/23/2023 0915   GLUCOSEU Negative 11/06/2012 2335   HGBUR SMALL (A) 09/23/2023 0915   BILIRUBINUR NEGATIVE 09/23/2023 0915   BILIRUBINUR Small 10/25/2021 1326   BILIRUBINUR Negative 01/24/2015 0000   BILIRUBINUR Negative 11/06/2012 2335   KETONESUR NEGATIVE 09/23/2023 0915   PROTEINUR 100 (A) 09/23/2023 0915   UROBILINOGEN 0.2 10/25/2021 1326   NITRITE NEGATIVE 09/23/2023 0915   LEUKOCYTESUR LARGE (A) 09/23/2023 0915   LEUKOCYTESUR 3+ 11/06/2012 2335   Sepsis Labs: @LABRCNTIP (procalcitonin:4,lacticidven:4) Microbiology: Recent Results (from the past 240 hours)  Blood Culture (routine x 2)     Status: None (Preliminary result)   Collection Time: 09/22/23  8:24 PM   Specimen: Right Antecubital; Blood  Result Value Ref Range Status   Specimen Description RIGHT  ANTECUBITAL  Final   Special Requests   Final    BOTTLES DRAWN AEROBIC AND ANAEROBIC Blood Culture adequate volume   Culture   Final    NO GROWTH 4 DAYS Performed at [facility], [address] [place], [place] [address]    Report Status PENDING  Incomplete  Resp panel by RT-PCR (RSV, Flu A&B, Covid) Anterior Nasal Swab     Status: None   Collection Time: 09/22/23  8:39 PM   Specimen: Anterior Nasal Swab  Result Value Ref Range Status   SARS Coronavirus 2 by RT PCR NEGATIVE NEGATIVE Final    Comment: (NOTE) SARS-CoV-2 target nucleic acids are NOT DETECTED.  The SARS-CoV-2 RNA is generally detectable in upper respiratory specimens during the acute phase of infection. The  lowest concentration of SARS-CoV-2 viral copies this assay can detect is 138 copies/mL. A negative result does not preclude SARS-Cov-2 infection and should not be used as the sole basis for treatment or other patient management decisions. A negative result may occur with  improper specimen collection/handling, submission of specimen other than nasopharyngeal swab, presence of viral mutation(s) within the areas targeted by this assay, and inadequate number of viral copies(<138 copies/mL). A negative result must be combined with clinical observations, patient history, and epidemiological information. The expected result is Negative.  Fact Sheet for Patients:  BloggerCourse.com  Fact Sheet for Healthcare Providers:  SeriousBroker.it  This test is no t yet approved or cleared by the [place]  FDA and  has been authorized for detection and/or diagnosis of SARS-CoV-2 by FDA under an Emergency Use Authorization (EUA). This EUA will remain  in effect (meaning this test can be used) for the duration of the COVID-19 declaration under Section 564(b)(1) of the Act, 21 U.S.C.section 360bbb-3(b)(1), unless the authorization is terminated  or revoked sooner.       Influenza A by PCR NEGATIVE NEGATIVE Final   Influenza B by PCR NEGATIVE NEGATIVE Final    Comment: (NOTE) The Xpert Xpress SARS-CoV-2/FLU/RSV plus assay is intended as an aid in the diagnosis of influenza from Nasopharyngeal swab specimens and should not be used as a sole basis for treatment. Nasal washings and aspirates are unacceptable for Xpert Xpress SARS-CoV-2/FLU/RSV testing.  Fact Sheet for Patients: BloggerCourse.com  Fact Sheet for Healthcare Providers: SeriousBroker.it  This test is not yet approved or cleared by the [place]  FDA and has been authorized for detection and/or diagnosis of SARS-CoV-2 by FDA under  an Emergency Use Authorization (EUA). This EUA will remain in effect (meaning this test can be used) for the duration of the COVID-19 declaration under Section 564(b)(1) of the Act, 21 U.S.C. section 360bbb-3(b)(1), unless the authorization is terminated or revoked.     Resp Syncytial Virus by PCR NEGATIVE NEGATIVE Final    Comment: (NOTE) Fact Sheet for Patients: BloggerCourse.com  Fact Sheet for Healthcare Providers: SeriousBroker.it  This test is not yet approved or cleared by the [place]  FDA and has been authorized for detection and/or diagnosis of SARS-CoV-2 by FDA under an Emergency Use Authorization (EUA). This EUA will remain in effect (meaning this test can be used) for the duration of the COVID-19 declaration under Section 564(b)(1) of the Act, 21 U.S.C. section 360bbb-3(b)(1), unless the authorization is terminated or revoked.  Performed at [facility], [address] [place], [place] [address]   Blood Culture (routine x 2)     Status: None (Preliminary result)   Collection Time: 09/22/23  8:39 PM   Specimen:  BLOOD RIGHT ARM  Result Value Ref Range Status   Specimen Description BLOOD RIGHT ARM  Final   Special Requests   Final    BOTTLES DRAWN AEROBIC AND ANAEROBIC Blood Culture adequate volume   Culture   Final    NO GROWTH 4 DAYS Performed at [facility], [address] [place], [place] [address]    Report Status PENDING  Incomplete  C Difficile Quick Screen w PCR reflex     Status: Abnormal   Collection Time: 09/23/23  9:14 AM   Specimen: STOOL  Result Value Ref Range Status   C Diff antigen POSITIVE (A) NEGATIVE Final   C Diff toxin NEGATIVE NEGATIVE Final   C Diff interpretation Results are indeterminate. See PCR results.  Final    Comment: Performed at [facility], [address] [place], [place] [address]  C. Diff by PCR, Reflexed     Status: None   Collection  Time: 09/23/23  9:14 AM  Result Value Ref Range Status   Toxigenic C. Difficile by PCR NEGATIVE NEGATIVE Final    Comment: Patient is colonized with non toxigenic C. difficile. May not need treatment unless significant symptoms are present.   Hypervirulent Strain PRESUMPTIVE NEGATIVE PRESUMPTIVE NEGATIVE Final    Comment: Performed at [facility], [address] [place], [place] [address]  Urine Culture     Status: Abnormal   Collection Time: 09/23/23  9:15 AM   Specimen: Urine, Random  Result Value Ref Range Status   Specimen Description   Final    URINE, RANDOM Performed at [facility], [address] [place], [place] [address]    Special Requests   Final    NONE Reflexed from [phone] Performed at [facility], [address] [place], [place] [address]    Culture   Final    Two isolates with different morphologies were identified as the same organism.The most resistant organism was reported. 80,000 COLONIES/mL ESCHERICHIA COLI    Report Status 09/26/2023 FINAL  Final   Organism ID, Bacteria ESCHERICHIA COLI (A)  Final      Susceptibility   Escherichia coli - MIC*    AMPICILLIN <=2 SENSITIVE Sensitive     CEFAZOLIN <=4 SENSITIVE Sensitive     CEFEPIME  <=0.12 SENSITIVE Sensitive     CEFTRIAXONE  <=0.25 SENSITIVE Sensitive     CIPROFLOXACIN  <=0.25 SENSITIVE Sensitive     GENTAMICIN <=1 SENSITIVE Sensitive     IMIPENEM <=0.25 SENSITIVE Sensitive     NITROFURANTOIN  32 SENSITIVE Sensitive     TRIMETH /SULFA  <=20 SENSITIVE Sensitive     AMPICILLIN/SULBACTAM <=2 SENSITIVE Sensitive     PIP/TAZO <=4 SENSITIVE Sensitive ug/mL    * 80,000 COLONIES/mL ESCHERICHIA COLI      Radiology Studies last 3 days: CT CHEST WO CONTRAST Result Date: 09/23/2023 CLINICAL DATA:  History of fall altered mental status, question interstitial lung disease EXAM: CT CHEST WITHOUT CONTRAST TECHNIQUE: Multidetector CT imaging of the chest was performed following the standard  protocol without IV contrast. RADIATION DOSE REDUCTION: This exam was performed according to the departmental dose-optimization program which includes automated exposure control, adjustment of the mA and/or kV according to patient size and/or use of iterative reconstruction technique. COMPARISON:  Chest x-ray 09/22/2023, chest CT 08/22/2022, 05/16/2022, 09/10/2021 FINDINGS: Cardiovascular: Limited evaluation without intravenous contrast. Moderate aortic atherosclerosis. No aneurysm. Coronary vascular calcification. Normal cardiac size. Trace pericardial effusion Mediastinum/Nodes: Patent trachea. Mildly prominent precarinal lymph node measuring 10 mm. Hypodense right thyroid  nodule measuring  2.6 cm This has been evaluated on previous imaging. (ref: J Am Coll Radiol. 2015 Feb;12(2): 143-50). Esophagus within normal limits. Lungs/Pleura: Emphysema. No pleural effusion or pneumothorax. Interstitial densities and mild reticulation within the upper lobes and right middle lobe suggestive of post infectious or post inflammatory scarring. No acute consolidative airspace disease. 3 mm right upper lobe pulmonary nodule on series 3, image 48, not clearly seen on the prior exam. Upper Abdomen: Hepatic steatosis. Incompletely visualized new moderate severe bilateral hydronephrosis. Multiple gallstones. Musculoskeletal: No acute or suspicious osseous abnormality IMPRESSION: 1. Emphysema with interstitial densities and mild reticulation within the upper lobes and right middle lobe suggestive of post infectious or post inflammatory scarring. No acute consolidative airspace disease. 2. 3 mm right upper lobe pulmonary nodule, not clearly seen on the prior exam. No follow-up needed if patient is low-risk.This recommendation follows the consensus statement: Guidelines for Management of Incidental Pulmonary Nodules Detected on CT Images: From the Fleischner Society 2017; Radiology 2017; 284:228-243. 3. 4. Hepatic steatosis. 5.  Cholelithiasis. 6. Aortic atherosclerosis. Aortic Atherosclerosis (ICD10-I70.0) and Emphysema (ICD10-J43.9). Electronically Signed   By: Luke Bun M.D.   On: 09/23/2023 00:28   DG Chest Port 1 View Result Date: 09/22/2023 EXAM: 1 VIEW XRAY OF THE CHEST 09/22/2023 08:55:28 PM COMPARISON: 09/08/2021 and 08/22/2022. CLINICAL HISTORY: Questionable sepsis - evaluate for abnormality. PER ER NOTE; Patient brought in via [facility] today from home with complaints of fall and altered mental status. Patient fell down around 7am this morning and has been in the floor since then as she was unable to get herself up off the floor. Husband and son ; in law waited to call 911 until tonight. Presents with lac to forehead which she states happened days ago. EMS also reports she has bed sore on her back that appears infected. FINDINGS: LUNGS AND PLEURA: Patchy airspace opacities in the right mid and lower lung may be due to atelectasis or infiltrates. Consider CT for further evaluation. No pleural effusion or pneumothorax. HEART AND MEDIASTINUM: Stable cardiomediastinal silhouette. BONES AND SOFT TISSUES: No acute osseous abnormality. IMPRESSION: 1. Patchy airspace opacities in the right mid and lower lung, possibly due to atelectasis or infiltrates. Consider CT for further evaluation. 2. No pleural effusion or pneumothorax. Electronically signed by: Norman Gatlin MD 09/22/2023 09:09 PM EDT RP Workstation: [id number]        Laneta Blunt, DO [facility] 09/26/2023, 11:07 AM    Dictation software may have been used to generate the above note. Typos may occur and escape review in typed/dictated notes. Please contact Dr Blunt directly for clarity if needed.  Staff may message me via secure chat in Epic  but this may not receive an immediate response,  please page me for urgent matters!  If 7PM-7AM, please contact night coverage [email or web address]

## 2023-09-27 DIAGNOSIS — R6510 Systemic inflammatory response syndrome (SIRS) of non-infectious origin without acute organ dysfunction: Secondary | ICD-10-CM | POA: Diagnosis not present

## 2023-09-27 LAB — BASIC METABOLIC PANEL WITH GFR
Anion gap: 8 (ref 5–15)
BUN: 12 mg/dL (ref 8–23)
CO2: 23 mmol/L (ref 22–32)
Calcium: 7.5 mg/dL — ABNORMAL LOW (ref 8.9–10.3)
Chloride: 107 mmol/L (ref 98–111)
Creatinine, Ser: 0.99 mg/dL (ref 0.44–1.00)
GFR, Estimated: >60 mL/min (ref >60)
Glucose, Bld: 89 mg/dL (ref 70–99)
Potassium: 3.3 mmol/L — ABNORMAL LOW (ref 3.5–5.1)
Sodium: 138 mmol/L (ref 135–145)

## 2023-09-27 LAB — GLUCOSE, CAPILLARY
Glucose-Capillary: 91 mg/dL (ref 70–99)
Glucose-Capillary: 107 mg/dL — ABNORMAL HIGH (ref 70–99)
Glucose-Capillary: 95 mg/dL (ref 70–99)
Glucose-Capillary: 111 mg/dL — ABNORMAL HIGH (ref 70–99)

## 2023-09-27 LAB — CULTURE, BLOOD (ROUTINE X 2)
Culture: NO GROWTH
Culture: NO GROWTH
Special Requests: ADEQUATE
Special Requests: ADEQUATE

## 2023-09-27 LAB — MAGNESIUM: Magnesium: 1.9 mg/dL (ref 1.7–2.4)

## 2023-09-27 MED ORDER — OXYCODONE HCL 5 MG PO TABS: 5.0000 mg | ORAL_TABLET | ORAL | 0 refills | Status: DC | PRN

## 2023-09-27 MED ORDER — COLCHICINE 0.6 MG PO TABS: 0.6000 mg | ORAL_TABLET | Freq: Two times a day (BID) | ORAL | Status: DC | PRN

## 2023-09-27 MED ORDER — MEDIHONEY WOUND/BURN DRESSING EX PSTE: 1.0000 | PASTE | Freq: Every day | CUTANEOUS | Status: DC

## 2023-09-27 MED ORDER — ASCORBIC ACID 500 MG PO TABS: 500.0000 mg | ORAL_TABLET | Freq: Two times a day (BID) | ORAL | Status: DC

## 2023-09-27 MED ORDER — OXYCODONE HCL 5 MG PO TABS
5.0000 mg | ORAL_TABLET | ORAL | Status: DC | PRN
  Administered 2023-09-27 – 2023-09-29 (×2): 5 mg via ORAL
  Filled 2023-09-27 (×2): qty 1

## 2023-09-27 MED ORDER — ZINC SULFATE 220 (50 ZN) MG PO CAPS: 220.0000 mg | ORAL_CAPSULE | Freq: Every day | ORAL | Status: DC

## 2023-09-27 MED ORDER — MIDODRINE HCL 5 MG PO TABS: 5.0000 mg | ORAL_TABLET | Freq: Three times a day (TID) | ORAL | Status: DC

## 2023-09-27 MED ORDER — DIPHENOXYLATE-ATROPINE 2.5-0.025 MG PO TABS: 2.0000 | ORAL_TABLET | Freq: Four times a day (QID) | ORAL | 0 refills | Status: DC | PRN

## 2023-09-27 MED ORDER — POTASSIUM CHLORIDE CRYS ER 20 MEQ PO TBCR
20.0000 meq | EXTENDED_RELEASE_TABLET | Freq: Once | ORAL | Status: AC
  Administered 2023-09-27: 20 meq via ORAL
  Filled 2023-09-27: qty 1

## 2023-09-27 MED ORDER — VANCOMYCIN HCL 125 MG PO CAPS: 125.0000 mg | ORAL_CAPSULE | Freq: Four times a day (QID) | ORAL | Status: AC

## 2023-09-27 MED ORDER — ADULT MULTIVITAMIN W/MINERALS CH
1.0000 | ORAL_TABLET | Freq: Every day | ORAL | Status: AC
Start: 2023-09-28 — End: ?

## 2023-09-27 NOTE — TOC Progression Note (Addendum)
 Transition of Care [facility]) - Progression Note    Patient Details  Name: Tricia [name] MRN: [id number] Date of Birth: 20-Jul-1960  Transition of Care [facility]) CM/SW Contact  Dalia GORMAN Fuse, RN Phone Number: 09/27/2023, 10:41 AM  Clinical Narrative:    Ins auth received for [employer]. Approved AuthID: [id number] Dates: 8/7-8/12/2023 Next Review Date: 09/30/2023. TOC outreached to Tammy at [employer] to make her aware and notified the MD.  TOC received call from Tammy at [facility], the PASRR screening was not on the patient's FL2. TOC noted the previous TOC documented the level 2 information was sent to [facility]. TOC faxed signed FL2, 30 day note, H&P and Progress notes to [facility]. PASRR Review pending  3:50 PM: TOC checked [facility], Documents have been received and uploaded.   TOC will continue to follow.   Expected Discharge Plan: Skilled Nursing Facility Barriers to Discharge: Continued Medical Work up               Expected Discharge Plan and Services   Discharge Planning Services: CM Consult   Living arrangements for the past 2 months: Single Family Home Expected Discharge Date: 09/27/23                                     Social Drivers of Health (SDOH) Interventions SDOH Screenings   Food Insecurity: No Food Insecurity (09/22/2023)  Housing: Low Risk  (09/22/2023)  Transportation Needs: No Transportation Needs (09/22/2023)  Utilities: Not At Risk (09/22/2023)  Alcohol Screen: Low Risk  (03/07/2023)  Depression (PHQ2-9): High Risk (05/01/2023)  Financial Resource Strain: Patient Declined (06/18/2023)   Received from [facility]  Physical Activity: Sufficiently Active (03/07/2023)  Social Connections: Socially Isolated (08/29/2023)  Stress: No Stress Concern Present (03/07/2023)  Tobacco Use: High Risk (09/26/2023)  Health Literacy: Adequate Health Literacy (03/07/2023)    Readmission Risk Interventions     No data to display

## 2023-09-27 NOTE — Progress Notes (Signed)
   09/27/23 0900  Spiritual Encounters  Type of Visit Initial  Care provided to: Patient  Conversation partners present during encounter Nurse  Referral source Other (comment) ([facility])  OnCall Visit Yes   On-Call Chaplain visited patient due to an AD  Consult in the EPIC system.  Chaplain explained the document to patient and let her know that if she's able to complete it while at the hospital, she'd let the Nurse know when she's ready for a notary and they'd page the Chaplain.  Chaplain also said that patient can take the document home and complete in the community.  Patient shared she had no other spiritual care needs at this time but shared she's not been able to enjoy her food here the way she'd like.  There has been a lot of eating.  Chaplain also let staff know PPE on the door needed to be replenished.    Rev. Rana M. Nicholaus, M.Div. Chaplain Resident  [facility]

## 2023-09-27 NOTE — Significant Event (Signed)
       CROSS COVER NOTE  NAME: Tricia [name] MRN: [id number] DOB : Nov 29, 1960 ATTENDING PHYSICIAN: Marsa Edelman, DO    Date of Service   09/27/2023   HPI/Events of Note   Informed by nurse patient with bladder scan volume above 500, also wet pad beneath her  Interventions   Assessment/Plan: Likely urinary retention with overflow icontinence In and out cath - (700 ml obtained per nurse report)        Erminio LITTIE Cone NP [facility] Cross Cover 7pm-7am - check amion for availability Pager [phone]

## 2023-09-27 NOTE — Discharge Summary (Signed)
 Physician Discharge Summary   [name] MRN: [id number]  DOB: 15-Dec-1960   Admit:     Date of Admission: 09/22/2023 Admitted from: home   Discharge: Date of discharge: 09/27/23 Disposition: Skilled nursing facility Condition at discharge: good  CODE STATUS: FULL CODE     Discharge Physician: Laneta Blunt, DO [facility]     PCP: Glenard Mire, MD  Recommendations for Outpatient Follow-up:  Follow up with PCP Glenard Mire, MD in 1-2 weeks Follow up with  Please obtain labs/tests: CBC, BMP in 1-2 weeks See below re: urinary retention - may need urology follow up  Please encourage patient to complete advanced directive     Discharge Instructions     Diet - low sodium heart healthy   Complete by: As directed    Discharge wound care:   Complete by: As directed    Cleanse sacral and buttock wounds with saline, pat dry Apply Medihoney to the sacral central wound (only to this wound) Cover each area with single layer of xeroform; top with foam. Change daily   Increase activity slowly   Complete by: As directed          Discharge Diagnoses: Principal Problem:   SIRS (systemic inflammatory response syndrome) (HCC) Active Problems:   CAP (community acquired pneumonia)   Chronic obstructive pulmonary disease (COPD) (HCC)   Chronic respiratory failure with hypoxia (HCC)   History of Mycobacterium avium intracellulare infection   History of Klebsiella UTI 09/11/2023   Chronic pain   Chronic prescription opiate use   Frequent falls   Physical deconditioning   Hypoalbuminemia due to protein-calorie malnutrition (HCC)   Tobacco use disorder   Chronic diastolic heart failure (HCC)   Diabetes mellitus type 2, insulin  dependent (HCC)   GAD (generalized anxiety disorder)   Parkinson's disease (HCC)   History of urinary retention   Acute metabolic encephalopathy   Pressure injury of skin       Hospital course /  significant events:   HPI: Tricia [name] is a 63 y.o. 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 She had no loss of consciousness and reports no injury. Patient's recent hospital stay was complicated by urinary retention requiring Foley and a DSS referral due to concerns for inadequate care at home.  She also had episodes of hypotension treated with midodrine .   08/03: to ED, tachycardic to 101 and febrile to 100.1, BP 111/66. Admitted to hospitalist for SIRS concern possible sepsis, procalcitonin 0.6 but CT chest neg for PNA.  08/04: continuing Foley and awaiting cultures. PT/OT recs for SNF rehab 08/05: SNF placement in process. Of note, family considering for DSS guardianship as they are not able/willing to care for patient. TOC spoke w/ granddaugther, pt states she lives w/ son in law (her daughter is deceased).  October 22, 2023: Pt is on room air. Pending SNF choice by family.  08/07: pt requests Foley out, void trial today. SNF  rehab still pending.  08/08: urinating better on her own but still needing intermittent in/out cath. Diarrhea/loose stool improving, no watery stool.      Consultants:  none  Procedures/Surgeries: none      ASSESSMENT & PLAN:   Sepsis secondary to acute diarrhea likely secondary to C. difficile infection History of MAC s/p treatment April 2025, no active pneumonia  Cdiff antigen positivity but PCR negative and  toxin negative but with ongoing diarrhea but this is improved/resolved  CT scan of the chest did not show any definite infiltrate continue oral vancomycin  for C. difficile treatment given significant symptoms   COPD with chronic respiratory failure on home O2 at 2 L Tobacco use disorder Have reasonably r/o pneumonia Prn bronchodilators  Breo ellipta  daily  Continue nicotine patch prn    History of Klebsiella UTI 09/11/2023 Pan-sensitive Ecoli cultured 09/23/23 Completed abx tx for UTI here   History of urinary retention requiring Foley 08/2023, Foley out 09/26/23 Prn bladder scan and in/out cath if residual >300 Urology follow up if needed    Physical deconditioning History of DSS referral July 2025 Continue PT OT at SNF   Frequent falls History of recurrent syncope History of hypotension/orthostatic hypotension requiring midodrine  Polypharmacy EF 60 to 65%, G1 DD 08/30/2023 Patient still with orthostatic hypotension Continue midodrine  Minimize sedating meds if possible - holding home BuSpar , cyclobenzaprine , gabapentin , hydroxyzine , morhpine, Xtampza    Hypokalemia Replace as needed Monitor BMP  Chronic pain Chronic prescription opioid use On PDMP review: home meds Xtampza  (extended release oxycodone ) 13.5 mg #90 x30 days, morphine  IR 15 mg #120 x30 days  Continue Cymbalta  Have held hom high dose d/t low BP Continue oxycodone  5 mg q4h prn Outpatient follow up  holding home cyclobenzaprine , gabapentin , morhpine, Xtampza     Hypoalbuminemia due to protein-calorie malnutrition  Dietician consulted   Chronic diastolic heart failure  Clinically euvolemic to dry EF 60 to 65%, G1 DD 08/30/2023 Continue Jardiance .  Holding Lasix  in the setting of ongoing diarrhea needing IV fluid supplementation    Diabetes mellitus type 2, insulin  independent Resume home meds Outpatient follow up    GAD (generalized anxiety disorder)  Continue duloxetine  and Seroquel    Parkinson's  disease  Continue Sinemet  Held gabapentin  d/t sedating effects    Capacity assessment - borderline Patient is oriented to person/place/time/situation. She can give details about her medical conditions and what brought her to the hospital. She is open to gong to rehab to get stronger and help prevent falls.  She does not have a plan in place if family unable to care for her, and she seems to not want to consider or discuss that possibility. She plans to go back to live w/ her son in law once she is able to. She accepts that right now given falling it's not safe to go home to live alone.  Her greatest concern is for her dogs, she is suspicious that her granddaughter is lying to her about having made calls about arranging foster homes for the dogs and se is very fixated on whether she can bring the dogs with her to rehab.  Patient has capacity at this time for reasonably uncomplicated decision-making but she demonstrates either some denial about possibility for certain undesired outcomes (living situation) or unwillingness to discuss such possibilities, so I question her capacity to fully engage with complex problems and will continue to evaluate. Encouraged to complete advanced directive given complicated family dynamics - she has this document has not filled it out yet  No concerns based on BMI: Body mass index is 22.49 kg/m.SABRA Significantly low or high BMI is associated with higher medical risk.  Underweight - under 18  overweight - 25 to 29 obese - 30 or more Class 1 obesity: BMI of 30.0 to 34 Class 2 obesity: BMI of 35.0 to 39 Class 3 obesity: BMI of 40.0 to 49 Super Morbid Obesity: BMI 50-59 Super-super Morbid Obesity: BMI 60+ Healthy nutrition and physical activity advised as adjunct to other disease management and risk reduction treatments             Discharge Instructions  Allergies as of 09/27/2023       Reactions   Augmentin [amoxicillin-pot Clavulanate] Diarrhea    Penicillins Itching        Medication List     STOP taking these medications    busPIRone  7.5 MG tablet Commonly known as: BUSPAR    gabapentin  600 MG tablet Commonly known as: NEURONTIN    hydrOXYzine  25 MG tablet Commonly known as: ATARAX    lubiprostone  24 MCG capsule Commonly known as: AMITIZA    morphine  15 MG tablet Commonly known as: MSIR   naproxen  500 MG tablet Commonly known as: NAPROSYN    silver  sulfADIAZINE  1 % cream Commonly known as: SILVADENE    Xtampza  ER 13.5 MG C12a Generic drug: oxyCODONE  ER       TAKE these medications    ascorbic acid  500 MG tablet Commonly known as: VITAMIN C  Take 1 tablet (500 mg total) by mouth 2 (two) times daily.   aspirin  EC 81 MG tablet Take 81 mg by mouth daily. Swallow whole.   carbidopa -levodopa  25-100 MG tablet Commonly known as: SINEMET  IR Take 1.5 tablets by mouth 3 (three) times daily.   carbidopa -levodopa  50-200 MG tablet Commonly known as: SINEMET  CR Take 1 tablet by mouth at bedtime.   colchicine  0.6 MG tablet Take 1 tablet (0.6 mg total) by mouth 2 (two) times daily as needed for up to 3 days (gout flare).   cyclobenzaprine  10 MG tablet Commonly known as: FLEXERIL  Take 10 mg by mouth at bedtime.   diphenoxylate -atropine  2.5-0.025 MG tablet Commonly known as: LOMOTIL  Take 2 tablets by mouth 4 (four) times daily as needed for diarrhea or loose stools.   DULoxetine  60 MG capsule Commonly known as: CYMBALTA  Take 1 capsule (60 mg total) by mouth daily.   empagliflozin  25 MG Tabs tablet Commonly known as: Jardiance  Take 1 tablet (25 mg total) by mouth daily before breakfast.   furosemide  40 MG tablet Commonly known as: LASIX  Take 1 tablet (40 mg total) by mouth daily as needed.   ipratropium-albuterol  0.5-2.5 (3) MG/3ML Soln Commonly known as: DuoNeb Inhale 3 mLs into the lungs every 6 (six) hours as needed.   leptospermum manuka honey Pste paste Apply 1 Application topically daily. Apply  to sacral wound Apply thin layer (3 mm) to wound. Start taking on: September 28, 2023   levocetirizine 5 MG tablet Commonly known as: XYZAL  Take 1 tablet (5 mg total) by mouth every evening.   midodrine  5 MG tablet Commonly known as: PROAMATINE  Take 1 tablet (5 mg total) by mouth 3 (three) times daily with meals.   montelukast  10 MG tablet Commonly known as: SINGULAIR  Take 1 tablet by mouth at bedtime.   multivitamin with minerals Tabs tablet Take 1 tablet by mouth daily. Start taking on: September 28, 2023   omeprazole  40 MG capsule Commonly known as: PRILOSEC Take 1 capsule (40 mg total) by mouth daily.   oxyCODONE  5  MG immediate release tablet Commonly known as: Oxy IR/ROXICODONE  Take 1 tablet (5 mg total) by mouth every 4 (four) hours as needed for moderate pain (pain score 4-6) or severe pain (pain score 7-10).   QUEtiapine  25 MG tablet Commonly known as: SEROQUEL  Take 1 tablet (25 mg total) by mouth at bedtime.   rosuvastatin  5 MG tablet Commonly known as: CRESTOR  Take 1 tablet (5 mg total) by mouth at bedtime.   Symbicort  160-4.5 MCG/ACT inhaler Generic drug: budesonide -formoterol  INHALE 2 PUFFS TWICE A DAY RINSE MOUTH WITH WATER AFTER EACH USE   theophylline  400 MG 24 hr tablet Commonly known as: UNIPHYL Take 1 tablet by mouth daily.   tiotropium 18 MCG inhalation capsule Commonly known as: SPIRIVA  Place 1 capsule into inhaler and inhale daily. pm   vancomycin  125 MG capsule Commonly known as: VANCOCIN  Take 1 capsule (125 mg total) by mouth 4 (four) times daily for 6 days.   Ventolin  HFA 108 (90 Base) MCG/ACT inhaler Generic drug: albuterol  INHALE 1 PUFF BY MOUTH AS NEEDED   zinc  sulfate (50mg  elemental zinc ) 220 (50 Zn) MG capsule Take 1 capsule (220 mg total) by mouth daily. Start taking on: September 28, 2023               Discharge Care Instructions  (From admission, onward)           Start     Ordered   09/27/23 0000  Discharge wound care:        Comments: Cleanse sacral and buttock wounds with saline, pat dry Apply Medihoney to the sacral central wound (only to this wound) Cover each area with single layer of xeroform; top with foam. Change daily   09/27/23 1055             Follow-up Information     Sowles, Krichna, MD Follow up.   Specialty: Family Medicine Why: hospital follow up Contact information: [address] [place] [address] [phone]                 Allergies  Allergen Reactions   Augmentin [Amoxicillin-Pot Clavulanate] Diarrhea   Penicillins Itching     Subjective: pt feeling okay this morning, no pain complaints, tolerating diet, diarrhea is improving, no fever/chills, no CP/SOB   Discharge Exam: BP 128/73 (BP Location: Right Leg)   Pulse 87   Temp 98.2 F (36.8 C) (Oral)   Resp 16   Ht 5' 8 (1.727 m)   Wt 67.1 kg   SpO2 100%   BMI 22.49 kg/m  General: Pt is alert, awake, not in acute distress Cardiovascular: RRR, S1/S2 +, no rubs, no gallops Respiratory: CTA bilaterally, no wheezing, no rhonchi Abdominal: Soft, NT, ND, bowel sounds + Extremities: no edema, no cyanosis     The results of significant diagnostics from this hospitalization (including imaging, microbiology, ancillary and laboratory) are listed below for reference.     Microbiology: Recent Results (from the past 240 hours)  Blood Culture (routine x 2)     Status: None   Collection Time: 09/22/23  8:24 PM   Specimen: Right Antecubital; Blood  Result Value Ref Range Status   Specimen Description RIGHT ANTECUBITAL  Final   Special Requests   Final    BOTTLES DRAWN AEROBIC AND ANAEROBIC Blood Culture adequate volume   Culture   Final    NO GROWTH 5 DAYS Performed at [facility], [address], [place], [place] [address]    Report Status 09/27/2023  FINAL  Final  Resp panel by RT-PCR (RSV, Flu A&B, Covid) Anterior Nasal Swab     Status: None   Collection Time: 09/22/23  8:39  PM   Specimen: Anterior Nasal Swab  Result Value Ref Range Status   SARS Coronavirus 2 by RT PCR NEGATIVE NEGATIVE Final    Comment: (NOTE) SARS-CoV-2 target nucleic acids are NOT DETECTED.  The SARS-CoV-2 RNA is generally detectable in upper respiratory specimens during the acute phase of infection. The lowest concentration of SARS-CoV-2 viral copies this assay can detect is 138 copies/mL. A negative result does not preclude SARS-Cov-2 infection and should not be used as the sole basis for treatment or other patient management decisions. A negative result may occur with  improper specimen collection/handling, submission of specimen other than nasopharyngeal swab, presence of viral mutation(s) within the areas targeted by this assay, and inadequate number of viral copies(<138 copies/mL). A negative result must be combined with clinical observations, patient history, and epidemiological information. The expected result is Negative.  Fact Sheet for Patients:  BloggerCourse.com  Fact Sheet for Healthcare Providers:  SeriousBroker.it  This test is no t yet approved or cleared by the [place]  FDA and  has been authorized for detection and/or diagnosis of SARS-CoV-2 by FDA under an Emergency Use Authorization (EUA). This EUA will remain  in effect (meaning this test can be used) for the duration of the COVID-19 declaration under Section 564(b)(1) of the Act, 21 U.S.C.section 360bbb-3(b)(1), unless the authorization is terminated  or revoked sooner.       Influenza A by PCR NEGATIVE NEGATIVE Final   Influenza B by PCR NEGATIVE NEGATIVE Final    Comment: (NOTE) The Xpert Xpress SARS-CoV-2/FLU/RSV plus assay is intended as an aid in the diagnosis of influenza from Nasopharyngeal swab specimens and should not be used as a sole basis for treatment. Nasal washings and aspirates are unacceptable for Xpert Xpress  SARS-CoV-2/FLU/RSV testing.  Fact Sheet for Patients: BloggerCourse.com  Fact Sheet for Healthcare Providers: SeriousBroker.it  This test is not yet approved or cleared by the [place]  FDA and has been authorized for detection and/or diagnosis of SARS-CoV-2 by FDA under an Emergency Use Authorization (EUA). This EUA will remain in effect (meaning this test can be used) for the duration of the COVID-19 declaration under Section 564(b)(1) of the Act, 21 U.S.C. section 360bbb-3(b)(1), unless the authorization is terminated or revoked.     Resp Syncytial Virus by PCR NEGATIVE NEGATIVE Final    Comment: (NOTE) Fact Sheet for Patients: BloggerCourse.com  Fact Sheet for Healthcare Providers: SeriousBroker.it  This test is not yet approved or cleared by the [place]  FDA and has been authorized for detection and/or diagnosis of SARS-CoV-2 by FDA under an Emergency Use Authorization (EUA). This EUA will remain in effect (meaning this test can be used) for the duration of the COVID-19 declaration under Section 564(b)(1) of the Act, 21 U.S.C. section 360bbb-3(b)(1), unless the authorization is terminated or revoked.  Performed at [facility], [address] [place], [place] [address]   Blood Culture (routine x 2)     Status: None   Collection Time: 09/22/23  8:39 PM   Specimen: BLOOD RIGHT ARM  Result Value Ref Range Status   Specimen Description BLOOD RIGHT ARM  Final   Special Requests   Final    BOTTLES DRAWN AEROBIC AND ANAEROBIC Blood Culture adequate volume   Culture   Final    NO GROWTH 5 DAYS Performed at [facility], [address] [place], [place] [address]    Report Status 09/27/2023 FINAL  Final  C Difficile Quick Screen w PCR reflex     Status: Abnormal   Collection Time: 09/23/23  9:14 AM   Specimen: STOOL  Result Value Ref Range  Status   C Diff antigen POSITIVE (A) NEGATIVE Final   C Diff toxin NEGATIVE NEGATIVE Final   C Diff interpretation Results are indeterminate. See PCR results.  Final    Comment: Performed at [facility], [address] [place], [place] [address]  C. Diff by PCR, Reflexed     Status: None   Collection Time: 09/23/23  9:14 AM  Result Value Ref Range Status   Toxigenic C. Difficile by PCR NEGATIVE NEGATIVE Final    Comment: Patient is colonized with non toxigenic C. difficile. May not need treatment unless significant symptoms are present.   Hypervirulent Strain PRESUMPTIVE NEGATIVE PRESUMPTIVE NEGATIVE Final    Comment: Performed at [facility], [address] [place], [place] [address]  Urine Culture     Status: Abnormal   Collection Time: 09/23/23  9:15 AM   Specimen: Urine, Random  Result Value Ref Range Status   Specimen Description   Final    URINE, RANDOM Performed at [facility], [address] [place], [place] [address]    Special Requests   Final    NONE Reflexed from [id number] Performed at [facility], [address] [place], [place] [address]    Culture   Final    Two isolates with different morphologies were identified as the same organism.The most resistant organism was reported. 80,000 COLONIES/mL ESCHERICHIA COLI    Report Status 09/26/2023 FINAL  Final   Organism ID, Bacteria ESCHERICHIA COLI (A)  Final      Susceptibility   Escherichia coli - MIC*    AMPICILLIN <=2 SENSITIVE Sensitive     CEFAZOLIN <=4 SENSITIVE Sensitive     CEFEPIME  <=0.12 SENSITIVE Sensitive     CEFTRIAXONE  <=0.25 SENSITIVE Sensitive     CIPROFLOXACIN  <=0.25 SENSITIVE Sensitive     GENTAMICIN <=1 SENSITIVE Sensitive     IMIPENEM <=0.25 SENSITIVE Sensitive     NITROFURANTOIN  32 SENSITIVE Sensitive     TRIMETH /SULFA  <=20 SENSITIVE Sensitive     AMPICILLIN/SULBACTAM <=2 SENSITIVE Sensitive     PIP/TAZO <=4 SENSITIVE Sensitive ug/mL    * 80,000  COLONIES/mL ESCHERICHIA COLI     Labs: BNP (last 3 results) No results for input(s): BNP in the last 8760 hours. Basic Metabolic Panel: Recent Labs  Lab 09/23/23 0501 09/23/23 1133 09/24/23 0355 09/25/23 0329 09/26/23 0318 09/27/23 0508  NA 132*  --  136 135 137 138  K 3.0* 3.4* 3.4* 2.8* 3.6 3.3*  CL 104  --  110 108 108 107  CO2 22  --  22 22 23 23   GLUCOSE 107*  --  139* 115* 88 89  BUN 22  --  20 17 15 12   CREATININE 1.17*  --  1.12* 1.01* 0.99 0.99  CALCIUM  7.2*  --  7.1* 7.1* 7.4* 7.5*  MG 1.8  --  2.2 1.7 2.0 1.9  PHOS  --   --  3.0  --  3.8  --    Liver Function Tests: Recent Labs  Lab 09/22/23 2024 09/23/23 0501  AST 44* 42*  ALT 6 9  ALKPHOS 95 90  BILITOT 1.1 0.9  PROT 5.5* 4.9*  ALBUMIN 2.2* 1.9*   No results for input(s):  LIPASE, AMYLASE in the last 168 hours. No results for input(s): AMMONIA in the last 168 hours. CBC: Recent Labs  Lab 09/22/23 2024 09/23/23 0501 09/25/23 0329  WBC 5.8 4.9 5.9  NEUTROABS  --   --  4.0  HGB 9.3* 9.4* 9.2*  HCT 28.6* 28.8* 28.6*  MCV 100.7* 100.0 100.4*  PLT 328 312 290   Cardiac Enzymes: Recent Labs  Lab 09/22/23 2024  CKTOTAL 474*   BNP: Invalid input(s): POCBNP CBG: Recent Labs  Lab 09/26/23 0746 09/26/23 1135 09/26/23 1628 09/26/23 2052 09/27/23 0846  GLUCAP 78 98 113* 151* 91   D-Dimer No results for input(s): DDIMER in the last 72 hours. Hgb A1c No results for input(s): HGBA1C in the last 72 hours. Lipid Profile No results for input(s): CHOL, HDL, LDLCALC, TRIG, CHOLHDL, LDLDIRECT in the last 72 hours. Thyroid  function studies No results for input(s): TSH, T4TOTAL, T3FREE, THYROIDAB in the last 72 hours.  Invalid input(s): FREET3 Anemia work up No results for input(s): VITAMINB12, FOLATE, FERRITIN, TIBC, IRON, RETICCTPCT in the last 72 hours. Urinalysis    Component Value Date/Time   COLORURINE YELLOW (A) 09/23/2023 0915    APPEARANCEUR CLOUDY (A) 09/23/2023 0915   APPEARANCEUR Cloudy (A) 01/24/2015 0000   LABSPEC 1.012 09/23/2023 0915   LABSPEC 1.005 11/06/2012 2335   PHURINE 7.0 09/23/2023 0915   GLUCOSEU NEGATIVE 09/23/2023 0915   GLUCOSEU Negative 11/06/2012 2335   HGBUR SMALL (A) 09/23/2023 0915   BILIRUBINUR NEGATIVE 09/23/2023 0915   BILIRUBINUR Small 10/25/2021 1326   BILIRUBINUR Negative 01/24/2015 0000   BILIRUBINUR Negative 11/06/2012 2335   KETONESUR NEGATIVE 09/23/2023 0915   PROTEINUR 100 (A) 09/23/2023 0915   UROBILINOGEN 0.2 10/25/2021 1326   NITRITE NEGATIVE 09/23/2023 0915   LEUKOCYTESUR LARGE (A) 09/23/2023 0915   LEUKOCYTESUR 3+ 11/06/2012 2335   Sepsis Labs Recent Labs  Lab 09/22/23 2024 09/23/23 0501 09/25/23 0329  WBC 5.8 4.9 5.9   Microbiology Recent Results (from the past 240 hours)  Blood Culture (routine x 2)     Status: None   Collection Time: 09/22/23  8:24 PM   Specimen: Right Antecubital; Blood  Result Value Ref Range Status   Specimen Description RIGHT ANTECUBITAL  Final   Special Requests   Final    BOTTLES DRAWN AEROBIC AND ANAEROBIC Blood Culture adequate volume   Culture   Final    NO GROWTH 5 DAYS Performed at [facility], [address] [place], [place] [address]    Report Status 09/27/2023 FINAL  Final  Resp panel by RT-PCR (RSV, Flu A&B, Covid) Anterior Nasal Swab     Status: None   Collection Time: 09/22/23  8:39 PM   Specimen: Anterior Nasal Swab  Result Value Ref Range Status   SARS Coronavirus 2 by RT PCR NEGATIVE NEGATIVE Final    Comment: (NOTE) SARS-CoV-2 target nucleic acids are NOT DETECTED.  The SARS-CoV-2 RNA is generally detectable in upper respiratory specimens during the acute phase of infection. The lowest concentration of SARS-CoV-2 viral copies this assay can detect is 138 copies/mL. A negative result does not preclude SARS-Cov-2 infection and should not be used as the sole basis for treatment or other patient  management decisions. A negative result may occur with  improper specimen collection/handling, submission of specimen other than nasopharyngeal swab, presence of viral mutation(s) within the areas targeted by this assay, and inadequate number of viral copies(<138 copies/mL). A negative result must be combined with clinical observations, patient history, and epidemiological information. The  expected result is Negative.  Fact Sheet for Patients:  BloggerCourse.com  Fact Sheet for Healthcare Providers:  SeriousBroker.it  This test is no t yet approved or cleared by the [place]  FDA and  has been authorized for detection and/or diagnosis of SARS-CoV-2 by FDA under an Emergency Use Authorization (EUA). This EUA will remain  in effect (meaning this test can be used) for the duration of the COVID-19 declaration under Section 564(b)(1) of the Act, 21 U.S.C.section 360bbb-3(b)(1), unless the authorization is terminated  or revoked sooner.       Influenza A by PCR NEGATIVE NEGATIVE Final   Influenza B by PCR NEGATIVE NEGATIVE Final    Comment: (NOTE) The Xpert Xpress SARS-CoV-2/FLU/RSV plus assay is intended as an aid in the diagnosis of influenza from Nasopharyngeal swab specimens and should not be used as a sole basis for treatment. Nasal washings and aspirates are unacceptable for Xpert Xpress SARS-CoV-2/FLU/RSV testing.  Fact Sheet for Patients: BloggerCourse.com  Fact Sheet for Healthcare Providers: SeriousBroker.it  This test is not yet approved or cleared by the [place]  FDA and has been authorized for detection and/or diagnosis of SARS-CoV-2 by FDA under an Emergency Use Authorization (EUA). This EUA will remain in effect (meaning this test can be used) for the duration of the COVID-19 declaration under Section 564(b)(1) of the Act, 21 U.S.C. section 360bbb-3(b)(1),  unless the authorization is terminated or revoked.     Resp Syncytial Virus by PCR NEGATIVE NEGATIVE Final    Comment: (NOTE) Fact Sheet for Patients: BloggerCourse.com  Fact Sheet for Healthcare Providers: SeriousBroker.it  This test is not yet approved or cleared by the [place]  FDA and has been authorized for detection and/or diagnosis of SARS-CoV-2 by FDA under an Emergency Use Authorization (EUA). This EUA will remain in effect (meaning this test can be used) for the duration of the COVID-19 declaration under Section 564(b)(1) of the Act, 21 U.S.C. section 360bbb-3(b)(1), unless the authorization is terminated or revoked.  Performed at [facility], [address] [place], [place] [address]   Blood Culture (routine x 2)     Status: None   Collection Time: 09/22/23  8:39 PM   Specimen: BLOOD RIGHT ARM  Result Value Ref Range Status   Specimen Description BLOOD RIGHT ARM  Final   Special Requests   Final    BOTTLES DRAWN AEROBIC AND ANAEROBIC Blood Culture adequate volume   Culture   Final    NO GROWTH 5 DAYS Performed at [facility], [address], [place], [place] [address]    Report Status 09/27/2023 FINAL  Final  C Difficile Quick Screen w PCR reflex     Status: Abnormal   Collection Time: 09/23/23  9:14 AM   Specimen: STOOL  Result Value Ref Range Status   C Diff antigen POSITIVE (A) NEGATIVE Final   C Diff toxin NEGATIVE NEGATIVE Final   C Diff interpretation Results are indeterminate. See PCR results.  Final    Comment: Performed at [facility], [address] [place], [place] [address]  C. Diff by PCR, Reflexed     Status: None   Collection Time: 09/23/23  9:14 AM  Result Value Ref Range Status   Toxigenic C. Difficile by PCR NEGATIVE NEGATIVE Final    Comment: Patient is colonized with non toxigenic C. difficile. May not need treatment unless significant symptoms are  present.   Hypervirulent Strain PRESUMPTIVE NEGATIVE PRESUMPTIVE NEGATIVE Final    Comment: Performed at [facility], [address] [place], [place] [address]  Urine Culture     Status: Abnormal   Collection Time: 09/23/23  9:15 AM   Specimen: Urine, Random  Result Value Ref Range Status   Specimen Description   Final    URINE, RANDOM Performed at [facility], [address] [place], [place] [address]    Special Requests   Final    NONE Reflexed from [id number] Performed at [facility], [address] [place], [place] [address]    Culture   Final    Two isolates with different morphologies were identified as the same organism.The most resistant organism was reported. 80,000 COLONIES/mL ESCHERICHIA COLI    Report Status 09/26/2023 FINAL  Final   Organism ID, Bacteria ESCHERICHIA COLI (A)  Final      Susceptibility   Escherichia coli - MIC*    AMPICILLIN <=2 SENSITIVE Sensitive     CEFAZOLIN <=4 SENSITIVE Sensitive     CEFEPIME  <=0.12 SENSITIVE Sensitive     CEFTRIAXONE  <=0.25 SENSITIVE Sensitive     CIPROFLOXACIN  <=0.25 SENSITIVE Sensitive     GENTAMICIN <=1 SENSITIVE Sensitive     IMIPENEM <=0.25 SENSITIVE Sensitive     NITROFURANTOIN  32 SENSITIVE Sensitive     TRIMETH /SULFA  <=20 SENSITIVE Sensitive     AMPICILLIN/SULBACTAM <=2 SENSITIVE Sensitive     PIP/TAZO <=4 SENSITIVE Sensitive ug/mL    * 80,000 COLONIES/mL ESCHERICHIA COLI   Imaging CT CHEST WO CONTRAST Result Date: 09/23/2023 CLINICAL DATA:  History of fall altered mental status, question interstitial lung disease EXAM: CT CHEST WITHOUT CONTRAST TECHNIQUE: Multidetector CT imaging of the chest was performed following the standard protocol without IV contrast. RADIATION DOSE REDUCTION: This exam was performed according to the departmental dose-optimization program which includes automated exposure control, adjustment of the mA and/or kV according to patient size and/or use of iterative  reconstruction technique. COMPARISON:  Chest x-ray 09/22/2023, chest CT 08/22/2022, 05/16/2022, 09/10/2021 FINDINGS: Cardiovascular: Limited evaluation without intravenous contrast. Moderate aortic atherosclerosis. No aneurysm. Coronary vascular calcification. Normal cardiac size. Trace pericardial effusion Mediastinum/Nodes: Patent trachea. Mildly prominent precarinal lymph node measuring 10 mm. Hypodense right thyroid  nodule measuring 2.6 cm This has been evaluated on previous imaging. (ref: J Am Coll Radiol. 2015 Feb;12(2): 143-50). Esophagus within normal limits. Lungs/Pleura: Emphysema. No pleural effusion or pneumothorax. Interstitial densities and mild reticulation within the upper lobes and right middle lobe suggestive of post infectious or post inflammatory scarring. No acute consolidative airspace disease. 3 mm right upper lobe pulmonary nodule on series 3, image 48, not clearly seen on the prior exam. Upper Abdomen: Hepatic steatosis. Incompletely visualized new moderate severe bilateral hydronephrosis. Multiple gallstones. Musculoskeletal: No acute or suspicious osseous abnormality IMPRESSION: 1. Emphysema with interstitial densities and mild reticulation within the upper lobes and right middle lobe suggestive of post infectious or post inflammatory scarring. No acute consolidative airspace disease. 2. 3 mm right upper lobe pulmonary nodule, not clearly seen on the prior exam. No follow-up needed if patient is low-risk.This recommendation follows the consensus statement: Guidelines for Management of Incidental Pulmonary Nodules Detected on CT Images: From the Fleischner Society 2017; Radiology 2017; 284:228-243. 3. 4. Hepatic steatosis. 5. Cholelithiasis. 6. Aortic atherosclerosis. Aortic Atherosclerosis (ICD10-I70.0) and Emphysema (ICD10-J43.9). Electronically Signed   By: Luke Bun M.D.   On: 09/23/2023 00:28   DG Chest Port 1 View Result Date: 09/22/2023 EXAM: 1 VIEW XRAY OF THE CHEST 09/22/2023  08:55:28 PM COMPARISON: 09/08/2021 and 08/22/2022. CLINICAL HISTORY: Questionable sepsis - evaluate for  abnormality. PER ER NOTE; Patient brought in via [facility] today from home with complaints of fall and altered mental status. Patient fell down around 7am this morning and has been in the floor since then as she was unable to get herself up off the floor. Husband and son ; in law waited to call 911 until tonight. Presents with lac to forehead which she states happened days ago. EMS also reports she has bed sore on her back that appears infected. FINDINGS: LUNGS AND PLEURA: Patchy airspace opacities in the right mid and lower lung may be due to atelectasis or infiltrates. Consider CT for further evaluation. No pleural effusion or pneumothorax. HEART AND MEDIASTINUM: Stable cardiomediastinal silhouette. BONES AND SOFT TISSUES: No acute osseous abnormality. IMPRESSION: 1. Patchy airspace opacities in the right mid and lower lung, possibly due to atelectasis or infiltrates. Consider CT for further evaluation. 2. No pleural effusion or pneumothorax. Electronically signed by: Norman Gatlin MD 09/22/2023 09:09 PM EDT RP Workstation: [id number]      Time coordinating discharge: over 30 minutes  SIGNED:  Samyuktha Brau DO [facility]

## 2023-09-27 NOTE — Plan of Care (Signed)
  Problem: Clinical Measurements: Goal: Signs and symptoms of infection will decrease Outcome: Progressing   Problem: Respiratory: Goal: Ability to maintain adequate ventilation will improve Outcome: Progressing   

## 2023-09-27 NOTE — TOC PASRR Note (Signed)
 The above named patient is recommended to go to Short Term Rehab for strengthening and gait training for balance.  It is expected that the Short Term Rehab stay will be less than 30 days.  The patient is expected to return home after Rehab.

## 2023-09-27 NOTE — Progress Notes (Signed)
 PHARMACY CONSULT NOTE - ELECTROLYTES  Pharmacy Consult for Electrolyte Monitoring and Replacement   Recent Labs: Height: 5' 8 (172.7 cm) Weight: 67.1 kg (147 lb 14.9 oz) IBW/kg (Calculated) : 63.9 Estimated Creatinine Clearance: 58.7 mL/min (by C-G formula based on SCr of 0.99 mg/dL). Potassium (mmol/L)  Date Value  09/27/2023 3.3 (L)  11/16/2013 4.0   Magnesium  (mg/dL)  Date Value  91/92/7974 2.0   Calcium  (mg/dL)  Date Value  91/91/7974 7.5 (L)   Calcium , Total (mg/dL)  Date Value  90/71/7984 7.9 (L)   Albumin (g/dL)  Date Value  91/95/7974 1.9 (L)  07/06/2015 4.2  10/07/2012 2.9 (L)   Phosphorus (mg/dL)  Date Value  91/92/7974 3.8   Sodium (mmol/L)  Date Value  09/27/2023 138  07/06/2015 146 (H)  11/16/2013 131 (L)   Corrected Ca: 9.2 mg/dL  Assessment  Tricia [name] is a 63 y.o. female presenting after mechanical fall at home with unknown downtime. PMH significant for HFpEF(EF 60 to 65%, G1 DD 08/30/2023), type II IDDM, HTN, GAD, Parkinson's, chronic pain on chronic opiates, tobacco use disorder COPD, stage II on home O2 at 2 L. Pharmacy has been consulted to monitor and replace electrolytes.  Diet: Regular, carb modified MIVF: None Pertinent medications: N/A  Goal of Therapy: Electrolytes WNL  Plan:  K 3.3, Mg 1.9 K below normal range. Ordered replacement 20mEq x1 PO this AM. Magnesium  at goal. BMP w/ mag level ordered for tomorrow 8/9 AM  Thank you for allowing pharmacy to be a part of this patient's care.  Leonor JAYSON Argyle, PharmD Clinical Pharmacist 09/27/2023 7:12 AM

## 2023-09-27 NOTE — Progress Notes (Signed)
 Physical Therapy Treatment Patient Details Name: Tricia [name] MRN: [id number] DOB: 10-20-1960 Today's Date: 09/27/2023   History of Present Illness Pt is a 63 y/o female admitted secondary to a fall and AMS. Pt was found down after several hours. Pt found to have CAP, SIRS and ?sepsis. Patient's recent hospital stay was complicated by urinary retention requiring Foley and a DSS referral due to concerns for inadequate care at home.  She also had episodes of hypotension treated with midodrine . 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    PT Comments  Pt was supine in bed upon arrival. She is A and O x 3. Does have poor overall insight of current situation. Slightly impulsive however cooperative and pleasant. She was able to exit bed, stand, and ambulate 3 laps in room with RW + assistance. Distance limited by need for BM. Overall pt tolerated session well. She remains at high risk of falls and is not at her baseline functionally. Recommend continued skilled PT to maximize her independence and safety with all ADLs. DC recs remain appropriate.          Equipment Recommendations  Other (comment) (Defer to next level of care)       Precautions / Restrictions Precautions Precautions: Fall Recall of Precautions/Restrictions: Impaired Restrictions Weight Bearing Restrictions Per Provider Order: No     Mobility  Bed Mobility Overal bed mobility: Needs Assistance Bed Mobility: Supine to Sit, Sit to Supine  Supine to sit: Min assist, HOB elevated, Used rails Sit to supine: Min assist   Transfers Overall transfer level: Needs  assistance Equipment used: Rolling walker (2 wheels) Transfers: Sit to/from Stand Sit to Stand: Contact guard assist, Min assist  General transfer comment: CGA-min assist due to impulsivity and poor safety awareness    Ambulation/Gait Ambulation/Gait assistance: Contact guard assist Assistive device: Rolling walker (2 wheels) Gait Pattern/deviations: Step-through pattern Gait velocity: decreased  General Gait Details: distance limited by pt needing to have BM.   Balance Overall balance assessment: Needs assistance Sitting-balance support: Feet supported Sitting balance-Leahy Scale: Good     Standing balance support: Bilateral upper extremity supported, During functional activity, Reliant on assistive device for balance Standing balance-Leahy Scale: Fair Standing balance comment: pt does remain high fall risk 2/2 to impulsivity and poor overall safety awareness and poor inisght of her deficits       Communication Communication Communication: No apparent difficulties  Cognition Arousal: Alert Behavior During Therapy: WFL for tasks assessed/performed   PT - Cognitive impairments: No family/caregiver present to determine baseline, Orientation, Awareness, Memory, Problem solving, Safety/Judgement   Orientation impairments: Situation, Time      PT - Cognition Comments: Pt is A and cooperative however congition deficits are present Following commands: Intact Following commands impaired: Follows one step commands with increased time    Cueing Cueing Techniques: Verbal cues, Tactile cues    PT Goals (current goals can now be found in the care plan section) Acute Rehab PT Goals Patient Stated Goal: Get better so I can go home to my dogs.    Frequency    Min 2X/week       AM-PAC PT 6 Clicks Mobility  Outcome Measure  Help needed turning from your back to your side while in a flat bed without using bedrails?: A Little Help needed moving from lying on your back to  sitting on the side of a flat bed without using bedrails?: A Little Help needed moving to and from a bed to a chair (including a wheelchair)?: A Little Help needed standing up from a chair using your arms (e.g., wheelchair or bedside chair)?: A Lot Help needed to walk in hospital room?: A Lot Help needed climbing 3-5 steps with a railing? : A Lot 6 Click Score: 15    End of Session   Activity Tolerance: Patient tolerated treatment well;Patient limited by fatigue;Other (comment) (limited by need to have BM) Patient left: in bed;with call bell/phone within reach;with bed alarm set Nurse Communication: Mobility status PT Visit Diagnosis: Other abnormalities of gait and mobility (R26.89)     Time:  -     Charges:                            Rankin Essex PTA 09/27/23, 7:56 AM

## 2023-09-27 NOTE — Progress Notes (Signed)
 Nutrition Follow-up  DOCUMENTATION CODES:   Severe malnutrition in context of social or environmental circumstances  INTERVENTION:   -Continue carb modified diet -Continue MVI with minerals daily -Continue 500 mg vitamin C  BID -Continue 220 gm zinc  sulfate daily x 14 days -Continue Magic cup TID with meals, each supplement provides 290 kcal and 9 grams of protein   NUTRITION DIAGNOSIS:   Severe Malnutrition related to social / environmental circumstances as evidenced by moderate fat depletion, severe fat depletion, moderate muscle depletion, severe muscle depletion.  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  Reviewed I/O's: -1.3 L x 24 hours and +231 ml since admission  UOP: 1.5 L x 24 hours  Pt sitting up in bed, eating breakfast and receiving nursing care at time of visit.   Pt remains on carb modified diet. Noted meal completions 40-90%. Per RN, pt has been noncompliant with carb modified diet, often drinking regular sodas brought in by family members.   No new wt since last visit.   Per TOC notes, pt awaiting insurance authorization for SNF ([facility]).   Medications reviewed and include  vitamin C , sinemet , jardiance , protonix , and zinc  sulfate.   Labs reviewed: K: 3.3, CBGS: 78-151 (inpatient orders for glycemic control are 0-5 units insulin  aspart daily at bedtime and 0-9 units insulin  aspart TID with meals).    Diet Order:   Diet Order             Diet Carb Modified Fluid consistency: Thin  Diet effective now                   EDUCATION NEEDS:   Education needs have been addressed  Skin:  Skin Assessment: Skin Integrity Issues: Skin Integrity Issues:: Unstageable, Stage I Stage I: lt buttock Stage II: rt buttock x 2 Unstageable: sacrum  Last BM:  09/23/23 (type 7)  Height:   Ht Readings from Last 1 Encounters:  09/22/23 5' 8 (1.727 m)    Weight:   Wt Readings from Last 1 Encounters:  09/22/23 67.1 kg    Ideal Body Weight:  63.6 kg  BMI:  Body mass index is 22.49 kg/m.  Estimated Nutritional Needs:   Kcal:  2000-2200  Protein:  100-115 grams  Fluid:  2.0-2.2 L    Margery ORN, RD, LDN, CDCES Registered Dietitian III Certified Diabetes Care and Education Specialist If unable to reach this RD, please use RD Inpatient group chat on secure chat between hours of 8am-4 pm daily

## 2023-09-28 DIAGNOSIS — R6510 Systemic inflammatory response syndrome (SIRS) of non-infectious origin without acute organ dysfunction: Secondary | ICD-10-CM | POA: Diagnosis not present

## 2023-09-28 LAB — BASIC METABOLIC PANEL WITH GFR
Anion gap: 4 — ABNORMAL LOW (ref 5–15)
BUN: 10 mg/dL (ref 8–23)
CO2: 24 mmol/L (ref 22–32)
Calcium: 7.5 mg/dL — ABNORMAL LOW (ref 8.9–10.3)
Chloride: 108 mmol/L (ref 98–111)
Creatinine, Ser: 1.08 mg/dL — ABNORMAL HIGH (ref 0.44–1.00)
GFR, Estimated: 58 mL/min — ABNORMAL LOW (ref >60)
Glucose, Bld: 109 mg/dL — ABNORMAL HIGH (ref 70–99)
Potassium: 3.6 mmol/L (ref 3.5–5.1)
Sodium: 136 mmol/L (ref 135–145)

## 2023-09-28 LAB — GLUCOSE, CAPILLARY
Glucose-Capillary: 80 mg/dL (ref 70–99)
Glucose-Capillary: 104 mg/dL — ABNORMAL HIGH (ref 70–99)
Glucose-Capillary: 73 mg/dL (ref 70–99)
Glucose-Capillary: 81 mg/dL (ref 70–99)

## 2023-09-28 LAB — MAGNESIUM: Magnesium: 1.9 mg/dL (ref 1.7–2.4)

## 2023-09-28 NOTE — Plan of Care (Signed)
  Problem: Clinical Measurements: Goal: Ability to maintain clinical measurements within normal limits will improve Outcome: Progressing Goal: Will remain free from infection Outcome: Progressing Goal: Respiratory complications will improve Outcome: Progressing Goal: Cardiovascular complication will be avoided Outcome: Progressing   Problem: Activity: Goal: Risk for activity intolerance will decrease Outcome: Progressing   Problem: Elimination: Goal: Will not experience complications related to bowel motility Outcome: Progressing Goal: Will not experience complications related to urinary retention Outcome: Progressing   Problem: Pain Managment: Goal: General experience of comfort will improve and/or be controlled Outcome: Progressing   Problem: Safety: Goal: Ability to remain free from injury will improve Outcome: Progressing   Problem: Skin Integrity: Goal: Risk for impaired skin integrity will decrease Outcome: Progressing   Problem: Metabolic: Goal: Ability to maintain appropriate glucose levels will improve Outcome: Progressing   Problem: Skin Integrity: Goal: Risk for impaired skin integrity will decrease Outcome: Progressing   Problem: Clinical Measurements: Goal: Signs and symptoms of infection will decrease Outcome: Progressing

## 2023-09-28 NOTE — Plan of Care (Signed)
 Patient remains free from any noted signs of acute distress.  No additional medical interventions for acute changes in current physical status/condition.  Patient to continue to be monitored by hospital staff until discharge.

## 2023-09-28 NOTE — Progress Notes (Signed)
 PROGRESS NOTE    Tricia [name]   FMW:[id number] DOB: 05-May-1960  DOA: 09/22/2023 Date of Service: 09/28/23 which is hospital day 5  PCP: Sowles, Krichna, MD    Hospital course / significant events:   HPI: Tricia [name] is a 63 y.o. 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 She had no loss of consciousness and reports no injury. Patient's recent hospital stay was complicated by urinary retention requiring Foley and a DSS referral due to concerns for inadequate care at home.  She also had episodes of hypotension treated with midodrine .   08/03: to ED, tachycardic to 101 and febrile to 100.1, BP 111/66. Admitted to hospitalist for SIRS concern possible sepsis, procalcitonin 0.6 but CT chest neg for PNA.  08/04: continuing Foley and awaiting cultures. PT/OT recs for SNF rehab 08/05: SNF placement in process. Of note, family considering for DSS guardianship as they are not able/willing to care for patient. TOC spoke w/ granddaugther, pt states she lives w/ son in law (her daughter is deceased).  10-16-2023: Pt is on room air. Pending SNF choice by family.  08/07: pt requests Foley out, void trial today. SNF rehab still pending.      Consultants:  none  Procedures/Surgeries: none      ASSESSMENT & PLAN:   Sepsis secondary to acute diarrhea likely secondary to C. 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 difficile treatment given significant symptoms   COPD with chronic respiratory failure on home O2 at 2 L Tobacco use disorder Have reasonably r/o pneumonia Prn bronchodilators  Breo ellipta  daily  Antitussives, flutter valve and incentive spirometer Continue nicotine patch    History of Klebsiella UTI 09/11/2023 History of urinary retention requiring Foley 08/2023 UCx (+)80K EColi pending susceptibilities   dc Foley and void trial today Transition to po abx   Physical deconditioning History of DSS referral July 2025 Continue PT OT TOC on board for SNF rehab placement   Frequent falls History of recurrent syncope History of hypotension/orthostatic hypotension requiring midodrine  Polypharmacy EF 60 to 65%, G1 DD 08/30/2023 Patient still with orthostatic hypotension Continue midodrine  Minimize sedating meds if possible - holding home BuSpar , cyclobenzaprine , gabapentin , hydroxyzine , morhpine, Xtampza    Hypokalemia Replace as needed Monitor BMP  Chronic pain Chronic prescription opioid use On PDMP review: home meds Xtampza  (extended release oxycodone ) 13.5 mg #90 x30 days, morphine  IR 15 mg #120 x30 days  Continue Cymbalta  Have held opiates d/t low BP and pt has not complained of pain holding home cyclobenzaprine , gabapentin , morhpine, Xtampza     Hypoalbuminemia due to protein-calorie malnutrition  Dietician consulted   Chronic diastolic heart failure  Clinically euvolemic to dry EF 60 to 65%, G1 DD 08/30/2023 Continue Jardiance .  Holding Lasix  in the setting of ongoing diarrhea needing IV fluid supplementation    Diabetes mellitus type 2, insulin  dependent Sliding scale insulin  coverage   GAD (generalized anxiety disorder)  Continue duloxetine   and Seroquel    Parkinson's disease  Continue Sinemet  Will hold gabapentin  d/t sedating effects    Capacity assessment - borderline Patient is oriented to person/place/time/situation. She can give details about her medical conditions and what brought her to the hospital. She is open to gong to rehab to get stronger and help prevent falls.  Patient has capacity at this time for reasonably uncomplicated decision-making but she demonstrates either some denial about possibility for certain undesired outcomes (living situation) or unwillingness to discuss such possibilities, she also is needing multiple explanations for delay in discharge but she is asking us  all about ways around it and I suspect she is fishing for a satisfactory answer rather than true misunderstanding. I question her capacity to fully engage with complex problems and will continue to evaluate.       No concerns based on BMI: Body mass index is 22.49 kg/m.SABRA Significantly low or high BMI is associated with higher medical risk.  Underweight - under 18  overweight - 25 to 29 obese - 30 or more Class 1 obesity: BMI of 30.0 to 34 Class 2 obesity: BMI of 35.0 to 39 Class 3 obesity: BMI of 40.0 to 49 Super Morbid Obesity: BMI 50-59 Super-super Morbid Obesity: BMI 60+ Healthy nutrition and physical activity advised as adjunct to other disease management and risk reduction treatments    DVT prophylaxis: lovenox  IV fluids: no continuous IV fluids  Nutrition: carb modified diet Central lines / other devices: Foley   Code Status: FULL CODE ACP documentation reviewed: none on file in [facility]  TOC needs: SNF rehab placement Medical barriers to dispo: none.              Subjective / Brief ROS:  Patient reports frustration that she hasn't discharged yet. I again repeat that she cannot go home now and go to rehab on Monday  Denies CP/SOB.  Pain controlled.  Denies new weakness.  Tolerating diet.  Reports no concerns  w/ urination/defecation.   Family Communication: pt has asked me not to communicate w/ her family right now     Objective Findings:  Vitals:   09/28/23 0020 09/28/23 0446 09/28/23 0817 09/28/23 1228  BP: 135/71 (!) 146/79 (!) 142/72 127/75  Pulse: 87 93 88 86  Resp: 18 18 18 18   Temp: 98.2 F (36.8 C) 98.5 F (36.9 C) 98.1 F (36.7 C) 98.4 F (36.9 C)  TempSrc: Oral Oral    SpO2: 99% 100% 100% 100%  Weight:      Height:        Intake/Output Summary (Last 24 hours) at 09/28/2023 1358 Last data filed at 09/28/2023 1300 Gross per 24 hour  Intake 600 ml  Output --  Net 600 ml   Filed Weights   09/22/23 2022 09/22/23 2320  Weight: 65.8 kg 67.1 kg    Examination:  Physical Exam Constitutional:      General: She is not in acute distress. Cardiovascular:     Rate and Rhythm: Normal rate and regular rhythm.  Pulmonary:     Effort: Pulmonary effort is normal.     Breath sounds: Normal breath sounds.  Abdominal:     Palpations: Abdomen is soft.  Musculoskeletal:     Right lower leg: No edema.     Left lower leg: No  edema.  Skin:    General: Skin is warm and dry.  Neurological:     Mental Status: She is alert and oriented to person, place, and time. Mental status is at baseline.  Psychiatric:        Behavior: Behavior normal.          Scheduled Medications:   vitamin C   500 mg Oral BID   aspirin  EC  81 mg Oral Daily   carbidopa -levodopa   1 tablet Oral QHS   carbidopa -levodopa   1.5 tablet Oral TID   cetirizine   10 mg Oral QPM   DULoxetine   60 mg Oral Daily   empagliflozin   25 mg Oral QAC breakfast   enoxaparin  (LOVENOX ) injection  40 mg Subcutaneous Q24H   fluticasone  furoate-vilanterol  1 puff Inhalation Daily   guaiFENesin   600 mg Oral BID   insulin  aspart  0-5 Units Subcutaneous QHS   insulin  aspart  0-9 Units Subcutaneous TID WC   leptospermum manuka honey  1 Application Topical Daily   midodrine   5 mg Oral TID WC   multivitamin with minerals  1  tablet Oral Daily   pantoprazole   40 mg Oral Daily   QUEtiapine   25 mg Oral QHS   rosuvastatin   5 mg Oral QHS   vancomycin   125 mg Oral QID   zinc  sulfate (50mg  elemental zinc )  220 mg Oral Daily    Continuous Infusions:   PRN Medications:  acetaminophen  **OR** acetaminophen , chlorpheniramine-HYDROcodone, colchicine , diphenoxylate -atropine , ipratropium-albuterol , ondansetron  **OR** ondansetron  (ZOFRAN ) IV, oxyCODONE   Antimicrobials from admission:  Anti-infectives (From admission, onward)    Start     Dose/Rate Route Frequency Ordered Stop   09/27/23 0000  vancomycin  (VANCOCIN ) 125 MG capsule        125 mg Oral 4 times daily 09/27/23 1055 10/03/23 2359   09/26/23 1200  cefadroxil  (DURICEF) capsule 500 mg        500 mg Oral 2 times daily 09/26/23 1036 09/26/23 2117   09/23/23 2100  cefTRIAXone  (ROCEPHIN ) 2 g in sodium chloride  0.9 % 100 mL IVPB  Status:  Discontinued        2 g 200 mL/hr over 30 Minutes Intravenous Every 24 hours 09/22/23 2333 09/26/23 1036   09/23/23 1800  vancomycin  (VANCOCIN ) capsule 125 mg        125 mg Oral 4 times daily 09/23/23 1704 10/03/23 1759   09/23/23 0030  azithromycin  (ZITHROMAX ) 500 mg in sodium chloride  0.9 % 250 mL IVPB  Status:  Discontinued        500 mg 250 mL/hr over 60 Minutes Intravenous Every 24 hours 09/22/23 2333 09/26/23 1036   09/22/23 2045  cefTRIAXone  (ROCEPHIN ) 2 g in sodium chloride  0.9 % 100 mL IVPB        2 g 200 mL/hr over 30 Minutes Intravenous Once 09/22/23 2030 09/22/23 2134   09/22/23 2045  vancomycin  (VANCOCIN ) IVPB 1000 mg/200 mL premix        1,000 mg 200 mL/hr over 60 Minutes Intravenous  Once 09/22/23 2030 09/22/23 2251           Data Reviewed:  I have personally reviewed the following...  CBC: Recent Labs  Lab 09/22/23 2024 09/23/23 0501 09/25/23 0329  WBC 5.8 4.9 5.9  NEUTROABS  --   --  4.0  HGB 9.3* 9.4* 9.2*  HCT 28.6* 28.8* 28.6*  MCV 100.7* 100.0 100.4*  PLT 328 312 290   Basic Metabolic  Panel: Recent Labs  Lab 09/24/23 0355 09/25/23 0329 09/26/23 0318  09/27/23 0508 09/28/23 0400  NA 136 135 137 138 136  K 3.4* 2.8* 3.6 3.3* 3.6  CL 110 108 108 107 108  CO2 22 22 23 23 24   GLUCOSE 139* 115* 88 89 109*  BUN 20 17 15 12 10   CREATININE 1.12* 1.01* 0.99 0.99 1.08*  CALCIUM  7.1* 7.1* 7.4* 7.5* 7.5*  MG 2.2 1.7 2.0 1.9 1.9  PHOS 3.0  --  3.8  --   --    GFR: Estimated Creatinine Clearance: 53.8 mL/min (A) (by C-G formula based on SCr of 1.08 mg/dL (H)). Liver Function Tests: Recent Labs  Lab 09/22/23 2024 09/23/23 0501  AST 44* 42*  ALT 6 9  ALKPHOS 95 90  BILITOT 1.1 0.9  PROT 5.5* 4.9*  ALBUMIN 2.2* 1.9*   No results for input(s): LIPASE, AMYLASE in the last 168 hours. No results for input(s): AMMONIA in the last 168 hours. Coagulation Profile: Recent Labs  Lab 09/22/23 2024  INR 1.4*   Cardiac Enzymes: Recent Labs  Lab 09/22/23 2024  CKTOTAL 474*   BNP (last 3 results) No results for input(s): PROBNP in the last 8760 hours. HbA1C: No results for input(s): HGBA1C in the last 72 hours. CBG: Recent Labs  Lab 09/27/23 1127 09/27/23 1654 09/27/23 2139 09/28/23 0721 09/28/23 1226  GLUCAP 107* 95 111* 80 104*   Lipid Profile: No results for input(s): CHOL, HDL, LDLCALC, TRIG, CHOLHDL, LDLDIRECT in the last 72 hours. Thyroid  Function Tests: No results for input(s): TSH, T4TOTAL, FREET4, T3FREE, THYROIDAB in the last 72 hours. Anemia Panel: No results for input(s): VITAMINB12, FOLATE, FERRITIN, TIBC, IRON, RETICCTPCT in the last 72 hours. Most Recent Urinalysis On File:     Component Value Date/Time   COLORURINE YELLOW (A) 09/23/2023 0915   APPEARANCEUR CLOUDY (A) 09/23/2023 0915   APPEARANCEUR Cloudy (A) 01/24/2015 0000   LABSPEC 1.012 09/23/2023 0915   LABSPEC 1.005 11/06/2012 2335   PHURINE 7.0 09/23/2023 0915   GLUCOSEU NEGATIVE 09/23/2023 0915   GLUCOSEU Negative 11/06/2012 2335   HGBUR  SMALL (A) 09/23/2023 0915   BILIRUBINUR NEGATIVE 09/23/2023 0915   BILIRUBINUR Small 10/25/2021 1326   BILIRUBINUR Negative 01/24/2015 0000   BILIRUBINUR Negative 11/06/2012 2335   KETONESUR NEGATIVE 09/23/2023 0915   PROTEINUR 100 (A) 09/23/2023 0915   UROBILINOGEN 0.2 10/25/2021 1326   NITRITE NEGATIVE 09/23/2023 0915   LEUKOCYTESUR LARGE (A) 09/23/2023 0915   LEUKOCYTESUR 3+ 11/06/2012 2335   Sepsis Labs: @LABRCNTIP (procalcitonin:4,lacticidven:4) Microbiology: Recent Results (from the past 240 hours)  Blood Culture (routine x 2)     Status: None   Collection Time: 09/22/23  8:24 PM   Specimen: Right Antecubital; Blood  Result Value Ref Range Status   Specimen Description RIGHT ANTECUBITAL  Final   Special Requests   Final    BOTTLES DRAWN AEROBIC AND ANAEROBIC Blood Culture adequate volume   Culture   Final    NO GROWTH 5 DAYS Performed at [facility], [address] [place], [place] [address]    Report Status 09/27/2023 FINAL  Final  Resp panel by RT-PCR (RSV, Flu A&B, Covid) Anterior Nasal Swab     Status: None   Collection Time: 09/22/23  8:39 PM   Specimen: Anterior Nasal Swab  Result Value Ref Range Status   SARS Coronavirus 2 by RT PCR NEGATIVE NEGATIVE Final    Comment: (NOTE) SARS-CoV-2 target nucleic acids are NOT DETECTED.  The SARS-CoV-2 RNA is generally detectable in upper respiratory specimens during the acute phase of infection.  The lowest concentration of SARS-CoV-2 viral copies this assay can detect is 138 copies/mL. A negative result does not preclude SARS-Cov-2 infection and should not be used as the sole basis for treatment or other patient management decisions. A negative result may occur with  improper specimen collection/handling, submission of specimen other than nasopharyngeal swab, presence of viral mutation(s) within the areas targeted by this assay, and inadequate number of viral copies(<138 copies/mL). A negative result must be  combined with clinical observations, patient history, and epidemiological information. The expected result is Negative.  Fact Sheet for Patients:  BloggerCourse.com  Fact Sheet for Healthcare Providers:  SeriousBroker.it  This test is no t yet approved or cleared by the [place]  FDA and  has been authorized for detection and/or diagnosis of SARS-CoV-2 by FDA under an Emergency Use Authorization (EUA). This EUA will remain  in effect (meaning this test can be used) for the duration of the COVID-19 declaration under Section 564(b)(1) of the Act, 21 U.S.C.section 360bbb-3(b)(1), unless the authorization is terminated  or revoked sooner.       Influenza A by PCR NEGATIVE NEGATIVE Final   Influenza B by PCR NEGATIVE NEGATIVE Final    Comment: (NOTE) The Xpert Xpress SARS-CoV-2/FLU/RSV plus assay is intended as an aid in the diagnosis of influenza from Nasopharyngeal swab specimens and should not be used as a sole basis for treatment. Nasal washings and aspirates are unacceptable for Xpert Xpress SARS-CoV-2/FLU/RSV testing.  Fact Sheet for Patients: BloggerCourse.com  Fact Sheet for Healthcare Providers: SeriousBroker.it  This test is not yet approved or cleared by the [place]  FDA and has been authorized for detection and/or diagnosis of SARS-CoV-2 by FDA under an Emergency Use Authorization (EUA). This EUA will remain in effect (meaning this test can be used) for the duration of the COVID-19 declaration under Section 564(b)(1) of the Act, 21 U.S.C. section 360bbb-3(b)(1), unless the authorization is terminated or revoked.     Resp Syncytial Virus by PCR NEGATIVE NEGATIVE Final    Comment: (NOTE) Fact Sheet for Patients: BloggerCourse.com  Fact Sheet for Healthcare Providers: SeriousBroker.it  This test is not yet  approved or cleared by the [place]  FDA and has been authorized for detection and/or diagnosis of SARS-CoV-2 by FDA under an Emergency Use Authorization (EUA). This EUA will remain in effect (meaning this test can be used) for the duration of the COVID-19 declaration under Section 564(b)(1) of the Act, 21 U.S.C. section 360bbb-3(b)(1), unless the authorization is terminated or revoked.  Performed at [facility], [address] [place], [place] [address]   Blood Culture (routine x 2)     Status: None   Collection Time: 09/22/23  8:39 PM   Specimen: BLOOD RIGHT ARM  Result Value Ref Range Status   Specimen Description BLOOD RIGHT ARM  Final   Special Requests   Final    BOTTLES DRAWN AEROBIC AND ANAEROBIC Blood Culture adequate volume   Culture   Final    NO GROWTH 5 DAYS Performed at [facility], [address], [place], [place] [address]    Report Status 09/27/2023 FINAL  Final  C Difficile Quick Screen w PCR reflex     Status: Abnormal   Collection Time: 09/23/23  9:14 AM   Specimen: STOOL  Result Value Ref Range Status   C Diff antigen POSITIVE (A) NEGATIVE Final   C Diff toxin NEGATIVE NEGATIVE Final   C Diff interpretation Results are indeterminate. See PCR results.  Final  Comment: Performed at [facility], [address] [place], [place] [address]  C. Diff by PCR, Reflexed     Status: None   Collection Time: 09/23/23  9:14 AM  Result Value Ref Range Status   Toxigenic C. Difficile by PCR NEGATIVE NEGATIVE Final    Comment: Patient is colonized with non toxigenic C. difficile. May not need treatment unless significant symptoms are present.   Hypervirulent Strain PRESUMPTIVE NEGATIVE PRESUMPTIVE NEGATIVE Final    Comment: Performed at [facility], [address] [place], [place] [address]  Urine Culture     Status: Abnormal   Collection Time: 09/23/23  9:15 AM   Specimen: Urine, Random  Result Value Ref Range Status    Specimen Description   Final    URINE, RANDOM Performed at [facility], [address] [place], [place] [address]    Special Requests   Final    NONE Reflexed from [id number] Performed at [facility], [address] [place], [place] [address]    Culture   Final    Two isolates with different morphologies were identified as the same organism.The most resistant organism was reported. 80,000 COLONIES/mL ESCHERICHIA COLI    Report Status 09/26/2023 FINAL  Final   Organism ID, Bacteria ESCHERICHIA COLI (A)  Final      Susceptibility   Escherichia coli - MIC*    AMPICILLIN <=2 SENSITIVE Sensitive     CEFAZOLIN <=4 SENSITIVE Sensitive     CEFEPIME  <=0.12 SENSITIVE Sensitive     CEFTRIAXONE  <=0.25 SENSITIVE Sensitive     CIPROFLOXACIN  <=0.25 SENSITIVE Sensitive     GENTAMICIN <=1 SENSITIVE Sensitive     IMIPENEM <=0.25 SENSITIVE Sensitive     NITROFURANTOIN  32 SENSITIVE Sensitive     TRIMETH /SULFA  <=20 SENSITIVE Sensitive     AMPICILLIN/SULBACTAM <=2 SENSITIVE Sensitive     PIP/TAZO <=4 SENSITIVE Sensitive ug/mL    * 80,000 COLONIES/mL ESCHERICHIA COLI      Radiology Studies last 3 days: No results found.        Denver Bentson, DO [facility] 09/28/2023, 1:58 PM    Dictation software may have been used to generate the above note. Typos may occur and escape review in typed/dictated notes. Please contact Dr Marsa directly for clarity if needed.  Staff may message me via secure chat in Epic  but this may not receive an immediate response,  please page me for urgent matters!  If 7PM-7AM, please contact night coverage [email or web address]

## 2023-09-28 NOTE — Progress Notes (Signed)
 Unable to d/c d/t no PASRR, see TOC note, discharge held, will maintain discharge order as pt is medically stable, will continue to round as usual and refresh dc summary when SNF is confirmed able to take pt / if other dispo plan arises

## 2023-09-29 DIAGNOSIS — R6510 Systemic inflammatory response syndrome (SIRS) of non-infectious origin without acute organ dysfunction: Secondary | ICD-10-CM | POA: Diagnosis not present

## 2023-09-29 LAB — GLUCOSE, CAPILLARY
Glucose-Capillary: 65 mg/dL — ABNORMAL LOW (ref 70–99)
Glucose-Capillary: 101 mg/dL — ABNORMAL HIGH (ref 70–99)
Glucose-Capillary: 131 mg/dL — ABNORMAL HIGH (ref 70–99)
Glucose-Capillary: 64 mg/dL — ABNORMAL LOW (ref 70–99)
Glucose-Capillary: 111 mg/dL — ABNORMAL HIGH (ref 70–99)
Glucose-Capillary: 137 mg/dL — ABNORMAL HIGH (ref 70–99)

## 2023-09-29 LAB — CREATININE, SERUM
Creatinine, Ser: 0.95 mg/dL (ref 0.44–1.00)
GFR, Estimated: >60 mL/min (ref >60)

## 2023-09-29 NOTE — Plan of Care (Signed)
 Patient remains free from any noted signs of acute distress.  Has not required prn medication for symptom management.  Patient to continue to be monitored by hospital staff until discharge.

## 2023-09-29 NOTE — Progress Notes (Deleted)
 09/30/2023 2:10 PM   Tricia [name] 1960/05/24 [id number]  Referring provider: Sowles, Krichna, MD [address] [place],  [place] [address]  Urological history: 1. Urinary retention - episode 2023 - episode (08/2023)   2. Left renal cyst - contrast CT (2023)   3. Left punctate lower pole stone - contrast CT (2023)   No chief complaint on file.  HPI: Tricia [name] is a 63 y.o. woman who presents today for trial of void.    Previous records reviewed.   Foley removed this am.    Lab Results  Component Value Date   WBC 5.9 09/25/2023   HGB 9.2 (L) 09/25/2023   HCT 28.6 (L) 09/25/2023   MCV 100.4 (H) 09/25/2023   PLT 290 09/25/2023   Lab Results  Component Value Date   CREATININE 0.95 09/29/2023   Lab Results  Component Value Date   HGBA1C 6.0 (A) 05/01/2023    PMH: Past Medical History:  Diagnosis Date   Anxiety    Arthritis    joints and hands/ knees   Asthma    uses inhaler   Benign essential tremor    head   Cervical dystonia    neck pain   Cholesteatoma of left ear    x2   COPD (chronic obstructive pulmonary disease) (HCC)    Cough    Depression    Diabetes mellitus without complication (HCC)    type 2   Diastolic dysfunction    Dyspnea    Dysrhythmia    diastolic dysfunction   GERD (gastroesophageal reflux disease)    Headache    migraines/ one per week   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   Hyperlipidemia    Hypertension    Motion sickness    boat   Neuromuscular disorder (HCC)    neuropathy feet and hands( nerve damage)   Wears dentures    upper and lower    Surgical History: Past Surgical History:  Procedure Laterality Date   CARPAL TUNNEL RELEASE Bilateral    x2 right, 1x on left   COLONOSCOPY     COLONOSCOPY WITH PROPOFOL  N/A 04/25/2017   Procedure: COLONOSCOPY WITH PROPOFOL ;  Surgeon: Jinny Carmine, MD;  Location: [facility];  Service: Endoscopy;  Laterality: N/A;   diabetic-oral med   DILATION AND CURETTAGE OF UTERUS     ESOPHAGOGASTRODUODENOSCOPY (EGD) WITH PROPOFOL  N/A 01/10/2021   Procedure: ESOPHAGOGASTRODUODENOSCOPY (EGD) WITH PROPOFOL ;  Surgeon: Janalyn Keene NOVAK, MD;  Location: [facility];  Service: Endoscopy;  Laterality: N/A;  Diabetic   EXTERNAL EAR SURGERY Left    x2   POLYPECTOMY  04/25/2017   Procedure: POLYPECTOMY INTESTINAL;  Surgeon: Jinny Carmine, MD;  Location: [facility];  Service: Endoscopy;;   SPINE SURGERY     herniated disc   TUBAL LIGATION      Home Medications:  Allergies as of 09/30/2023       Reactions   Augmentin [amoxicillin-pot Clavulanate] Diarrhea   Penicillins Itching        Medication List      Notice   This visit is during an admission. Changes to the med list made in this visit will be reflected in the After Visit Summary of the admission.     Allergies:  Allergies  Allergen Reactions   Augmentin [Amoxicillin-Pot Clavulanate] Diarrhea   Penicillins Itching    Family History: Family History  Problem Relation Age of Onset   Emphysema Mother  Anxiety disorder Mother    Stroke Father    Throat cancer Father    Lung cancer Maternal Grandmother    Lung cancer Maternal Grandfather    Hypertension Daughter    Diabetes Daughter    Multiple sclerosis Daughter    Bipolar disorder Daughter    Cervical cancer Daughter    Bipolar disorder Daughter    Drug abuse Daughter    Lung cancer Maternal Aunt    Lung cancer Maternal Uncle     Social History: See HPI for pertinent social history  ROS: Pertinent ROS in HPI  Physical Exam: There were no vitals taken for this visit.  Constitutional:  Well nourished. Alert and oriented, No acute distress. HEENT: [place] AT, moist mucus membranes.  Trachea midline, no masses. Cardiovascular: No clubbing, cyanosis, or edema. Respiratory: Normal respiratory effort, no increased work of breathing. GU: No CVA tenderness.  No bladder fullness  or masses.  Recession of labia minora, dry, pale vulvar vaginal mucosa and loss of mucosal ridges and folds.  Normal urethral meatus, no lesions, no prolapse, no discharge.   No urethral masses, tenderness and/or tenderness. No bladder fullness, tenderness or masses. *** vagina mucosa, *** estrogen effect, no discharge, no lesions, *** pelvic support, *** cystocele and *** rectocele noted.  No cervical motion tenderness.  Uterus is freely mobile and non-fixed.  No adnexal/parametria masses or tenderness noted.  Anus and perineum are without rashes or lesions.   ***  Neurologic: Grossly intact, no focal deficits, moving all 4 extremities. Psychiatric: Normal mood and affect.    Laboratory Data: See EPIC and HPI  I have reviewed the labs.   Pertinent Imaging: ***  Assessment & Plan:  ***  1. Urinary retention ***  No follow-ups on file.  These notes generated with voice recognition software. I apologize for typographical errors.  Tricia [name]  [facility] [address]  [address] [place], [place] [address] [phone]

## 2023-09-29 NOTE — Progress Notes (Signed)
 PROGRESS NOTE    Tricia [name]   FMW:[id number] DOB: Aug 14, 1960  DOA: 09/22/2023 Date of Service: 09/29/23 which is hospital day 6  PCP: Sowles, Krichna, MD    Hospital course / significant events:   HPI: Tricia [name] is a 63 y.o. 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 She had no loss of consciousness and reports no injury. Patient's recent hospital stay was complicated by urinary retention requiring Foley and a DSS referral due to concerns for inadequate care at home.  She also had episodes of hypotension treated with midodrine .   08/03: to ED, tachycardic to 101 and febrile to 100.1, BP 111/66. Admitted to hospitalist for SIRS concern possible sepsis, procalcitonin 0.6 but CT chest neg for PNA.  08/04: continuing Foley and awaiting cultures. PT/OT recs for SNF rehab 08/05: SNF placement in process. Of note, family considering for DSS guardianship as they are not able/willing to care for patient. TOC spoke w/ granddaugther, pt states she lives w/ son in law (her daughter is deceased).  10/13/23: Pt is on room air. Pending SNF choice by family.  08/07: pt requests Foley out, void trial today. SNF rehab still pending.  08/08-08/10: placement pending thru the weekend      Consultants:  none  Procedures/Surgeries: none      ASSESSMENT & PLAN:   Sepsis secondary to acute diarrhea likely secondary to C. difficile  infection History of MAC s/p treatment April 2025, no active pneumonia  Cdiff antigen positivity but PCR negative and toxin negative but with ongoing diarrhea but this is improved/resolved  CT scan of the chest did not show any definite infiltrate continue oral vancomycin  for C. difficile treatment given significant symptoms   COPD with chronic respiratory failure on home O2 at 2 L Tobacco use disorder Have reasonably r/o pneumonia Prn bronchodilators  Breo ellipta  daily  Antitussives, flutter valve and incentive spirometer Continue nicotine patch    History of Klebsiella UTI 09/11/2023 History of urinary retention requiring Foley 08/2023 UCx (+)80K EColi pan sensitive  dc Foley and void trial successful Transition to po abx to finish course    Physical deconditioning History of DSS referral July 2025 Continue PT OT TOC on board for SNF rehab placement   Frequent falls History of recurrent syncope History of hypotension/orthostatic hypotension requiring midodrine  Polypharmacy EF 60 to 65%, G1 DD 08/30/2023 Patient still with orthostatic hypotension Continue midodrine  Minimize sedating meds if possible - holding home BuSpar , cyclobenzaprine , gabapentin , hydroxyzine , morhpine, Xtampza    Hypokalemia Replace as needed Monitor BMP  Chronic pain Chronic prescription opioid use On PDMP review: home meds Xtampza  (extended release oxycodone ) 13.5 mg #90 x30 days, morphine  IR 15 mg #120 x30 days  Continue Cymbalta  Have held high dose opiates d/t low BP and pt has not complained of uncontrolled pain holding home cyclobenzaprine , gabapentin , morhpine, Xtampza     Hypoalbuminemia due to protein-calorie malnutrition  Dietician consulted   Chronic diastolic heart failure  Clinically  euvolemic to dry EF 60 to 65%, G1 DD 08/30/2023 Continue Jardiance .  Holding Lasix  in the setting of ongoing diarrhea needing IV fluid supplementation    Diabetes mellitus type 2, insulin   dependent Sliding scale insulin  coverage   GAD (generalized anxiety disorder)  Continue duloxetine  and Seroquel    Parkinson's disease  Continue Sinemet  Will hold gabapentin  d/t sedating effects    Capacity assessment - borderline Patient is oriented to person/place/time/situation. She can give details about her medical conditions and what brought her to the hospital. She is open to gong to rehab to get stronger and help prevent falls.  Patient has capacity at this time for reasonably uncomplicated decision-making but she demonstrates either some denial about possibility for certain undesired outcomes (living situation) or unwillingness to discuss such possibilities, she also is needing multiple explanations for delay in discharge but she is asking us  all about ways around it and I suspect she is fishing for a satisfactory answer rather than true misunderstanding. I question her capacity to fully engage with complex problems and will continue to evaluate.       No concerns based on BMI: Body mass index is 22.49 kg/m.SABRA Significantly low or high BMI is associated with higher medical risk.  Underweight - under 18  overweight - 25 to 29 obese - 30 or more Class 1 obesity: BMI of 30.0 to 34 Class 2 obesity: BMI of 35.0 to 39 Class 3 obesity: BMI of 40.0 to 49 Super Morbid Obesity: BMI 50-59 Super-super Morbid Obesity: BMI 60+ Healthy nutrition and physical activity advised as adjunct to other disease management and risk reduction treatments    DVT prophylaxis: lovenox  IV fluids: no continuous IV fluids  Nutrition: carb modified diet Central lines / other devices: Foley   Code Status: FULL CODE ACP documentation reviewed: none on file in [facility]  TOC needs: SNF rehab placement Medical barriers to dispo: none.              Subjective / Brief ROS:  Patient reports frustration that she hasn't discharged yet but is less upset than yesterday, asks about getting extra bedside  commode at her house  Denies CP/SOB.  Pain controlled.  Denies new weakness.  Tolerating diet.  Reports no concerns w/ urination/defecation.   Family Communication: pt has asked me not to communicate w/ her family right now     Objective Findings:  Vitals:   09/28/23 2106 09/29/23 0024 09/29/23 0848 09/29/23 1300  BP: (!) 151/78 132/77 122/79 139/80  Pulse: 77 73 83 93  Resp: 17 17 16 18   Temp: 98.6 F (37 C) 98 F (36.7 C) 98.1 F (36.7 C)   TempSrc: Oral Oral    SpO2: 100% 100% 100% 100%  Weight:      Height:        Intake/Output Summary (Last 24 hours) at 09/29/2023 1552 Last data filed at 09/29/2023 1300 Gross per 24 hour  Intake 720 ml  Output --  Net 720 ml   Filed Weights   09/22/23 2022 09/22/23 2320  Weight: 65.8 kg 67.1 kg    Examination:  Physical Exam Constitutional:      General: She is not in acute distress. Cardiovascular:     Rate and Rhythm: Normal rate and regular rhythm.  Pulmonary:     Effort: Pulmonary effort is normal.     Breath sounds: Normal breath sounds.  Abdominal:     Palpations: Abdomen is soft.  Musculoskeletal:     Right lower leg: No  edema.     Left lower leg: No edema.  Skin:    General: Skin is warm and dry.  Neurological:     Mental Status: She is alert and oriented to person, place, and time. Mental status is at baseline.  Psychiatric:        Behavior: Behavior normal.          Scheduled Medications:   vitamin C   500 mg Oral BID   aspirin  EC  81 mg Oral Daily   carbidopa -levodopa   1 tablet Oral QHS   carbidopa -levodopa   1.5 tablet Oral TID   cetirizine   10 mg Oral QPM   DULoxetine   60 mg Oral Daily   empagliflozin   25 mg Oral QAC breakfast   enoxaparin  (LOVENOX ) injection  40 mg Subcutaneous Q24H   fluticasone  furoate-vilanterol  1 puff Inhalation Daily   guaiFENesin   600 mg Oral BID   insulin  aspart  0-5 Units Subcutaneous QHS   insulin  aspart  0-9 Units Subcutaneous TID WC   leptospermum manuka honey   1 Application Topical Daily   midodrine   5 mg Oral TID WC   multivitamin with minerals  1 tablet Oral Daily   pantoprazole   40 mg Oral Daily   QUEtiapine   25 mg Oral QHS   rosuvastatin   5 mg Oral QHS   vancomycin   125 mg Oral QID   zinc  sulfate (50mg  elemental zinc )  220 mg Oral Daily    Continuous Infusions:   PRN Medications:  acetaminophen  **OR** acetaminophen , chlorpheniramine-HYDROcodone, colchicine , diphenoxylate -atropine , ipratropium-albuterol , ondansetron  **OR** ondansetron  (ZOFRAN ) IV, oxyCODONE   Antimicrobials from admission:  Anti-infectives (From admission, onward)    Start     Dose/Rate Route Frequency Ordered Stop   09/27/23 0000  vancomycin  (VANCOCIN ) 125 MG capsule        125 mg Oral 4 times daily 09/27/23 1055 10/03/23 2359   09/26/23 1200  cefadroxil  (DURICEF) capsule 500 mg        500 mg Oral 2 times daily 09/26/23 1036 09/26/23 2117   09/23/23 2100  cefTRIAXone  (ROCEPHIN ) 2 g in sodium chloride  0.9 % 100 mL IVPB  Status:  Discontinued        2 g 200 mL/hr over 30 Minutes Intravenous Every 24 hours 09/22/23 2333 09/26/23 1036   09/23/23 1800  vancomycin  (VANCOCIN ) capsule 125 mg        125 mg Oral 4 times daily 09/23/23 1704 10/03/23 1759   09/23/23 0030  azithromycin  (ZITHROMAX ) 500 mg in sodium chloride  0.9 % 250 mL IVPB  Status:  Discontinued        500 mg 250 mL/hr over 60 Minutes Intravenous Every 24 hours 09/22/23 2333 09/26/23 1036   09/22/23 2045  cefTRIAXone  (ROCEPHIN ) 2 g in sodium chloride  0.9 % 100 mL IVPB        2 g 200 mL/hr over 30 Minutes Intravenous Once 09/22/23 2030 09/22/23 2134   09/22/23 2045  vancomycin  (VANCOCIN ) IVPB 1000 mg/200 mL premix        1,000 mg 200 mL/hr over 60 Minutes Intravenous  Once 09/22/23 2030 09/22/23 2251           Data Reviewed:  I have personally reviewed the following...  CBC: Recent Labs  Lab 09/22/23 2024 09/23/23 0501 09/25/23 0329  WBC 5.8 4.9 5.9  NEUTROABS  --   --  4.0  HGB 9.3* 9.4*  9.2*  HCT 28.6* 28.8* 28.6*  MCV 100.7* 100.0 100.4*  PLT 328 312 290   Basic Metabolic Panel: Recent  Labs  Lab 09/24/23 0355 09/25/23 0329 09/26/23 0318 09/27/23 0508 09/28/23 0400 09/29/23 0509  NA 136 135 137 138 136  --   K 3.4* 2.8* 3.6 3.3* 3.6  --   CL 110 108 108 107 108  --   CO2 22 22 23 23 24   --   GLUCOSE 139* 115* 88 89 109*  --   BUN 20 17 15 12 10   --   CREATININE 1.12* 1.01* 0.99 0.99 1.08* 0.95  CALCIUM  7.1* 7.1* 7.4* 7.5* 7.5*  --   MG 2.2 1.7 2.0 1.9 1.9  --   PHOS 3.0  --  3.8  --   --   --    GFR: Estimated Creatinine Clearance: 61.1 mL/min (by C-G formula based on SCr of 0.95 mg/dL). Liver Function Tests: Recent Labs  Lab 09/22/23 2024 09/23/23 0501  AST 44* 42*  ALT 6 9  ALKPHOS 95 90  BILITOT 1.1 0.9  PROT 5.5* 4.9*  ALBUMIN 2.2* 1.9*   No results for input(s): LIPASE, AMYLASE in the last 168 hours. No results for input(s): AMMONIA in the last 168 hours. Coagulation Profile: Recent Labs  Lab 09/22/23 2024  INR 1.4*   Cardiac Enzymes: Recent Labs  Lab 09/22/23 2024  CKTOTAL 474*   BNP (last 3 results) No results for input(s): PROBNP in the last 8760 hours. HbA1C: No results for input(s): HGBA1C in the last 72 hours. CBG: Recent Labs  Lab 09/28/23 1648 09/28/23 2110 09/29/23 0844 09/29/23 1033 09/29/23 1258  GLUCAP 73 81 65* 101* 131*   Lipid Profile: No results for input(s): CHOL, HDL, LDLCALC, TRIG, CHOLHDL, LDLDIRECT in the last 72 hours. Thyroid  Function Tests: No results for input(s): TSH, T4TOTAL, FREET4, T3FREE, THYROIDAB in the last 72 hours. Anemia Panel: No results for input(s): VITAMINB12, FOLATE, FERRITIN, TIBC, IRON, RETICCTPCT in the last 72 hours. Most Recent Urinalysis On File:     Component Value Date/Time   COLORURINE YELLOW (A) 09/23/2023 0915   APPEARANCEUR CLOUDY (A) 09/23/2023 0915   APPEARANCEUR Cloudy (A) 01/24/2015 0000   LABSPEC 1.012 09/23/2023  0915   LABSPEC 1.005 11/06/2012 2335   PHURINE 7.0 09/23/2023 0915   GLUCOSEU NEGATIVE 09/23/2023 0915   GLUCOSEU Negative 11/06/2012 2335   HGBUR SMALL (A) 09/23/2023 0915   BILIRUBINUR NEGATIVE 09/23/2023 0915   BILIRUBINUR Small 10/25/2021 1326   BILIRUBINUR Negative 01/24/2015 0000   BILIRUBINUR Negative 11/06/2012 2335   KETONESUR NEGATIVE 09/23/2023 0915   PROTEINUR 100 (A) 09/23/2023 0915   UROBILINOGEN 0.2 10/25/2021 1326   NITRITE NEGATIVE 09/23/2023 0915   LEUKOCYTESUR LARGE (A) 09/23/2023 0915   LEUKOCYTESUR 3+ 11/06/2012 2335   Sepsis Labs: @LABRCNTIP (procalcitonin:4,lacticidven:4) Microbiology: Recent Results (from the past 240 hours)  Blood Culture (routine x 2)     Status: None   Collection Time: 09/22/23  8:24 PM   Specimen: Right Antecubital; Blood  Result Value Ref Range Status   Specimen Description RIGHT ANTECUBITAL  Final   Special Requests   Final    BOTTLES DRAWN AEROBIC AND ANAEROBIC Blood Culture adequate volume   Culture   Final    NO GROWTH 5 DAYS Performed at [facility], [address] [place], [place] [address]    Report Status 09/27/2023 FINAL  Final  Resp panel by RT-PCR (RSV, Flu A&B, Covid) Anterior Nasal Swab     Status: None   Collection Time: 09/22/23  8:39 PM   Specimen: Anterior Nasal Swab  Result Value Ref Range Status   SARS  Coronavirus 2 by RT PCR NEGATIVE NEGATIVE Final    Comment: (NOTE) SARS-CoV-2 target nucleic acids are NOT DETECTED.  The SARS-CoV-2 RNA is generally detectable in upper respiratory specimens during the acute phase of infection. The lowest concentration of SARS-CoV-2 viral copies this assay can detect is 138 copies/mL. A negative result does not preclude SARS-Cov-2 infection and should not be used as the sole basis for treatment or other patient management decisions. A negative result may occur with  improper specimen collection/handling, submission of specimen other than nasopharyngeal swab,  presence of viral mutation(s) within the areas targeted by this assay, and inadequate number of viral copies(<138 copies/mL). A negative result must be combined with clinical observations, patient history, and epidemiological information. The expected result is Negative.  Fact Sheet for Patients:  BloggerCourse.com  Fact Sheet for Healthcare Providers:  SeriousBroker.it  This test is no t yet approved or cleared by the [place]  FDA and  has been authorized for detection and/or diagnosis of SARS-CoV-2 by FDA under an Emergency Use Authorization (EUA). This EUA will remain  in effect (meaning this test can be used) for the duration of the COVID-19 declaration under Section 564(b)(1) of the Act, 21 U.S.C.section 360bbb-3(b)(1), unless the authorization is terminated  or revoked sooner.       Influenza A by PCR NEGATIVE NEGATIVE Final   Influenza B by PCR NEGATIVE NEGATIVE Final    Comment: (NOTE) The Xpert Xpress SARS-CoV-2/FLU/RSV plus assay is intended as an aid in the diagnosis of influenza from Nasopharyngeal swab specimens and should not be used as a sole basis for treatment. Nasal washings and aspirates are unacceptable for Xpert Xpress SARS-CoV-2/FLU/RSV testing.  Fact Sheet for Patients: BloggerCourse.com  Fact Sheet for Healthcare Providers: SeriousBroker.it  This test is not yet approved or cleared by the [place]  FDA and has been authorized for detection and/or diagnosis of SARS-CoV-2 by FDA under an Emergency Use Authorization (EUA). This EUA will remain in effect (meaning this test can be used) for the duration of the COVID-19 declaration under Section 564(b)(1) of the Act, 21 U.S.C. section 360bbb-3(b)(1), unless the authorization is terminated or revoked.     Resp Syncytial Virus by PCR NEGATIVE NEGATIVE Final    Comment: (NOTE) Fact Sheet for  Patients: BloggerCourse.com  Fact Sheet for Healthcare Providers: SeriousBroker.it  This test is not yet approved or cleared by the [place]  FDA and has been authorized for detection and/or diagnosis of SARS-CoV-2 by FDA under an Emergency Use Authorization (EUA). This EUA will remain in effect (meaning this test can be used) for the duration of the COVID-19 declaration under Section 564(b)(1) of the Act, 21 U.S.C. section 360bbb-3(b)(1), unless the authorization is terminated or revoked.  Performed at [facility], [address] [place], [place] [address]   Blood Culture (routine x 2)     Status: None   Collection Time: 09/22/23  8:39 PM   Specimen: BLOOD RIGHT ARM  Result Value Ref Range Status   Specimen Description BLOOD RIGHT ARM  Final   Special Requests   Final    BOTTLES DRAWN AEROBIC AND ANAEROBIC Blood Culture adequate volume   Culture   Final    NO GROWTH 5 DAYS Performed at [facility], [address] [place], [place] [address]    Report Status 09/27/2023 FINAL  Final  C Difficile Quick Screen w PCR reflex     Status: Abnormal   Collection Time: 09/23/23  9:14 AM   Specimen: STOOL  Result Value Ref Range Status   C Diff antigen POSITIVE (A) NEGATIVE Final   C Diff toxin NEGATIVE NEGATIVE Final   C Diff interpretation Results are indeterminate. See PCR results.  Final    Comment: Performed at [facility], [address] [place], [place] [address]  C. Diff by PCR, Reflexed     Status: None   Collection Time: 09/23/23  9:14 AM  Result Value Ref Range Status   Toxigenic C. Difficile by PCR NEGATIVE NEGATIVE Final    Comment: Patient is colonized with non toxigenic C. difficile. May not need treatment unless significant symptoms are present.   Hypervirulent Strain PRESUMPTIVE NEGATIVE PRESUMPTIVE NEGATIVE Final    Comment: Performed at [facility], [address] [place], [place] [address]  Urine Culture     Status: Abnormal   Collection Time: 09/23/23  9:15 AM   Specimen: Urine, Random  Result Value Ref Range Status   Specimen Description   Final    URINE, RANDOM Performed at [facility], [address], [place], [place] [address]    Special Requests   Final    NONE Reflexed from [id number] Performed at [facility], [address] [place], [place] [address]    Culture   Final    Two isolates with different morphologies were identified as the same organism.The most resistant organism was reported. 80,000 COLONIES/mL ESCHERICHIA COLI    Report Status 09/26/2023 FINAL  Final   Organism ID, Bacteria ESCHERICHIA COLI (A)  Final      Susceptibility   Escherichia coli - MIC*    AMPICILLIN <=2 SENSITIVE Sensitive     CEFAZOLIN <=4 SENSITIVE Sensitive     CEFEPIME  <=0.12 SENSITIVE Sensitive     CEFTRIAXONE  <=0.25 SENSITIVE Sensitive     CIPROFLOXACIN  <=0.25 SENSITIVE Sensitive     GENTAMICIN <=1 SENSITIVE Sensitive     IMIPENEM <=0.25 SENSITIVE Sensitive     NITROFURANTOIN  32 SENSITIVE Sensitive     TRIMETH /SULFA  <=20 SENSITIVE Sensitive     AMPICILLIN/SULBACTAM <=2 SENSITIVE Sensitive     PIP/TAZO <=4 SENSITIVE Sensitive ug/mL    * 80,000 COLONIES/mL ESCHERICHIA COLI      Radiology Studies last 3 days: No results found.        Teya Otterson, DO [facility] 09/29/2023, 3:52 PM    Dictation software may have been used to generate the above note. Typos may occur and escape review in typed/dictated notes. Please contact Dr Marsa directly for clarity if needed.  Staff may message me via secure chat in Epic  but this may not receive an immediate response,  please page me for urgent matters!  If 7PM-7AM, please contact night coverage [email or web address]

## 2023-09-30 ENCOUNTER — Ambulatory Visit: Admitting: Urology

## 2023-09-30 DIAGNOSIS — R6510 Systemic inflammatory response syndrome (SIRS) of non-infectious origin without acute organ dysfunction: Secondary | ICD-10-CM | POA: Diagnosis not present

## 2023-09-30 DIAGNOSIS — R339 Retention of urine, unspecified: Secondary | ICD-10-CM

## 2023-09-30 LAB — GLUCOSE, CAPILLARY
Glucose-Capillary: 83 mg/dL (ref 70–99)
Glucose-Capillary: 115 mg/dL — ABNORMAL HIGH (ref 70–99)

## 2023-09-30 MED ORDER — KETOROLAC TROMETHAMINE 30 MG/ML IJ SOLN
15.0000 mg | Freq: Once | INTRAMUSCULAR | Status: AC
  Administered 2023-09-30 (×2): 15 mg via INTRAVENOUS
  Filled 2023-09-30: qty 1

## 2023-09-30 MED ORDER — CALCIUM CARBONATE ANTACID 500 MG PO CHEW: 400.0000 mg | CHEWABLE_TABLET | Freq: Three times a day (TID) | ORAL | Status: AC | PRN

## 2023-09-30 MED ORDER — CALCIUM CARBONATE ANTACID 500 MG PO CHEW
400.0000 mg | CHEWABLE_TABLET | Freq: Three times a day (TID) | ORAL | Status: DC | PRN
  Administered 2023-09-30 (×2): 400 mg via ORAL
  Filled 2023-09-30: qty 2

## 2023-09-30 NOTE — Plan of Care (Signed)
 Patient has rested throughout night shift, without any noted acute distress.  No additional medical intervention required after initiating nebulizer treatment.  Patient remains free from any noted signs of acute respiratory distress.  Patient to continue to be monitored by hospital staff until discharged.

## 2023-09-30 NOTE — TOC Progression Note (Signed)
 Transition of Care [facility]) - Progression Note    Patient Details  Name: Tricia [name] MRN: [id number] Date of Birth: 11/24/1960  [facility] CM/SW Contact  Dalia GORMAN Fuse, RN Phone Number: 09/30/2023, 9:38 AM  Clinical Narrative:    [facility] [phone]  09/27/2023-10/27/2023   Expected Discharge Plan: Skilled Nursing Facility Barriers to Discharge: Continued Medical Work up               Expected Discharge Plan and Services   Discharge Planning Services: CM Consult   Living arrangements for the past 2 months: Single Family Home Expected Discharge Date: 09/27/23                                     Social Drivers of Health (SDOH) Interventions SDOH Screenings   Food Insecurity: No Food Insecurity (09/22/2023)  Housing: Low Risk  (09/22/2023)  Transportation Needs: No Transportation Needs (09/22/2023)  Utilities: Not At Risk (09/22/2023)  Alcohol Screen: Low Risk  (03/07/2023)  Depression (PHQ2-9): High Risk (05/01/2023)  Financial Resource Strain: Patient Declined (06/18/2023)   Received from [facility]  Physical Activity: Sufficiently Active (03/07/2023)  Social Connections: Socially Isolated (08/29/2023)  Stress: No Stress Concern Present (03/07/2023)  Tobacco Use: High Risk (09/26/2023)  Health Literacy: Adequate Health Literacy (03/07/2023)    Readmission Risk Interventions     No data to display

## 2023-09-30 NOTE — Discharge Summary (Addendum)
 Physician Discharge Summary   [name] MRN: [id number]  DOB: Mar 19, 1960   Admit:     Date of Admission: 09/22/2023 Admitted from: home   Discharge: Date of discharge: 09/30/23 Disposition: Skilled nursing facility Condition at discharge: good  CODE STATUS: FULL CODE     Discharge Physician: Laneta Blunt, DO [facility]     PCP: Glenard Mire, MD  Recommendations for Outpatient Follow-up:  Follow up with PCP Glenard Mire, MD in 1-2 weeks Follow up with  Please obtain labs/tests: CBC, BMP in 1-2 weeks Please encourage patient to complete advanced directive     Discharge Instructions     Diet - low sodium heart healthy   Complete by: As directed    Discharge wound care:   Complete by: As directed    Cleanse sacral and buttock wounds with saline, pat dry Apply Medihoney to the sacral central wound (only to this wound) Cover each area with single layer of xeroform; top with foam. Change daily   Increase activity slowly   Complete by: As directed          Discharge Diagnoses: Principal Problem:   SIRS (systemic inflammatory response syndrome) (HCC) Active Problems:   CAP (community acquired pneumonia)   Chronic obstructive pulmonary disease (COPD) (HCC)   Chronic respiratory failure with hypoxia (HCC)   History of Mycobacterium avium intracellulare infection   History of Klebsiella UTI 09/11/2023   Chronic pain   Chronic prescription opiate use   Frequent falls   Physical deconditioning   Hypoalbuminemia due to protein-calorie malnutrition (HCC)   Tobacco use disorder   Chronic diastolic heart failure (HCC)   Diabetes mellitus type 2, insulin  dependent (HCC)   GAD (generalized anxiety disorder)   Parkinson's disease (HCC)   History of urinary retention   Acute metabolic encephalopathy   Pressure injury of skin     Hospital course / significant events:   HPI: Tricia [name] is a 63 y.o. female with  medical history significant for HFpEF(EF 60 to 65%, G1 DD 08/30/2023), type II IDDM, HTN, GAD, Parkinson's, chronic pain on chronic opiates, tobacco use disorder COPD, stage II on home O2 at 2 L, history of MAC 05/2023 with chronic cough and dyspnea, followed by pulmonology, history of syncope frequent falls, with recent prolonged hospitalization 7/9 - 09/18/2023 for syncope/Klebsiella UTI/rhabdo/AKI brought in by EMS with another fall on the day of arrival in which she was too weak to get up lying on the ground for several hours until she was finally agreeable to coming into the ED. She had no loss of consciousness and reports no injury. Patient's recent hospital stay was complicated by urinary retention requiring Foley and a DSS referral due to concerns for inadequate care at home.  She also had episodes of hypotension treated with midodrine .   08/03: to ED, tachycardic to 101 and febrile to 100.1, BP 111/66. Admitted to hospitalist for SIRS concern possible sepsis, procalcitonin 0.6 but CT chest neg for PNA.  08/04: continuing Foley and awaiting cultures. PT/OT recs for SNF rehab 08/05: SNF placement in process. Of note, family considering for DSS guardianship as they are not able/willing to care for patient. TOC spoke w/ granddaugther, pt states she lives w/ son in law (her daughter is deceased).  Sep 30, 2023: Pt is on room air. Pending SNF choice by family.  08/07: pt requests Foley out, void trial today. SNF rehab still pending.  08/08-08/10: placement pending thru the weekend  08/11: to SNF  rehab      Consultants:  none  Procedures/Surgeries: none      ASSESSMENT & PLAN:   Sepsis secondary to acute diarrhea likely secondary to C. difficile infection History of MAC s/p treatment April 2025, no active pneumonia  Cdiff antigen positivity but PCR negative and toxin negative but with ongoing diarrhea but this is improved/resolved  CT scan of the chest did not show any definite  infiltrate continue oral vancomycin  for C. difficile treatment given significant symptoms   COPD with chronic respiratory failure on home O2 at 2 L Tobacco use disorder Have reasonably r/o pneumonia Prn bronchodilators  Breo ellipta  daily  Antitussives, flutter valve and incentive spirometer Continue nicotine patch    Diarrhea  Lomotil  prn Expect may need GI follow up   History of Klebsiella UTI 09/11/2023 History of urinary retention requiring Foley 08/2023 UCx (+)80K EColi pan sensitive  dc Foley and void trial successful Transition to po abx to finish course  Follow w/ urology if further retention problems    Physical deconditioning History of DSS referral July 2025 Continue PT OT, rehab placement   Frequent falls History of recurrent syncope History of hypotension/orthostatic hypotension requiring midodrine  Polypharmacy EF 60 to 65%, G1 DD 08/30/2023 Patient still with orthostatic hypotension Continue midodrine  Minimize sedating meds - holding home BuSpar , cyclobenzaprine , gabapentin , hydroxyzine , morhpine, Xtampza    Hypokalemia Replace as needed Monitor BMP  Chronic pain Chronic prescription opioid use On PDMP review: home meds Xtampza  (extended release oxycodone ) 13.5 mg #90 x30 days, morphine  IR 15 mg #120 x30 days  Continue Cymbalta  Have held high dose opiates d/t low BP and pt has not complained of uncontrolled pain holding home cyclobenzaprine , gabapentin , morhpine, Xtampza     Hypoalbuminemia due to protein-calorie malnutrition  Dietician consulted   Chronic diastolic heart failure  Clinically euvolemic to dry EF 60 to 65%, G1 DD 08/30/2023 Continue Jardiance .  Holding Lasix  in the setting of ongoing diarrhea needing IV fluid supplementation --> restart on discharge prn edema    Diabetes mellitus type 2, insulin  independent Resume home meds    GAD (generalized anxiety disorder)  Continue duloxetine  and Seroquel    Parkinson's disease  Continue  Sinemet  hold gabapentin  d/t sedating effects    Capacity assessment - borderline Patient is oriented to person/place/time/situation. She can give details about her medical conditions and what brought her to the hospital. She is open to gong to rehab to get stronger and help prevent falls.  Patient has capacity at this time for reasonably uncomplicated decision-making but she demonstrates either some denial about possibility for certain undesired outcomes (living situation) or unwillingness to discuss such possibilities, she also is needing multiple explanations for delay in discharge but she is asking us  all about ways around it and I suspect she is fishing for a satisfactory answer rather than true misunderstanding. I question her capacity to fully engage with complex problems and will continue to evaluate.       No concerns based on BMI: Body mass index is 22.49 kg/m.SABRA Significantly low or high BMI is associated with higher medical risk.  Underweight - under 18  overweight - 25 to 29 obese - 30 or more Class 1 obesity: BMI of 30.0 to 34 Class 2 obesity: BMI of 35.0 to 39 Class 3 obesity: BMI of 40.0 to 49 Super Morbid Obesity: BMI 50-59 Super-super Morbid Obesity: BMI 60+ Healthy nutrition and physical activity advised as adjunct to other disease management and risk reduction treatments  Discharge Instructions  Allergies as of 09/30/2023       Reactions   Augmentin [amoxicillin-pot Clavulanate] Diarrhea   Penicillins Itching        Medication List     STOP taking these medications    busPIRone  7.5 MG tablet Commonly known as: BUSPAR    gabapentin  600 MG tablet Commonly known as: NEURONTIN    hydrOXYzine  25 MG tablet Commonly known as: ATARAX    lubiprostone  24 MCG capsule Commonly known as: AMITIZA    morphine  15 MG tablet Commonly known as: MSIR   naproxen  500 MG tablet Commonly known as: NAPROSYN    silver  sulfADIAZINE  1 % cream Commonly  known as: SILVADENE    Xtampza  ER 13.5 MG C12a Generic drug: oxyCODONE  ER       TAKE these medications    ascorbic acid  500 MG tablet Commonly known as: VITAMIN C  Take 1 tablet (500 mg total) by mouth 2 (two) times daily.   aspirin  EC 81 MG tablet Take 81 mg by mouth daily. Swallow whole.   calcium  carbonate 500 MG chewable tablet Commonly known as: TUMS - dosed in mg elemental calcium  Chew 2 tablets (400 mg of elemental calcium  total) by mouth 3 (three) times daily as needed for indigestion or heartburn.   carbidopa -levodopa  25-100 MG tablet Commonly known as: SINEMET  IR Take 1.5 tablets by mouth 3 (three) times daily.   carbidopa -levodopa  50-200 MG tablet Commonly known as: SINEMET  CR Take 1 tablet by mouth at bedtime.   colchicine  0.6 MG tablet Take 1 tablet (0.6 mg total) by mouth 2 (two) times daily as needed for up to 3 days (gout flare).   cyclobenzaprine  10 MG tablet Commonly known as: FLEXERIL  Take 10 mg by mouth at bedtime.   diphenoxylate -atropine  2.5-0.025 MG tablet Commonly known as: LOMOTIL  Take 2 tablets by mouth 4 (four) times daily as needed for diarrhea or loose stools.   DULoxetine  60 MG capsule Commonly known as: CYMBALTA  Take 1 capsule (60 mg total) by mouth daily.   empagliflozin  25 MG Tabs tablet Commonly known as: Jardiance  Take 1 tablet (25 mg total) by mouth daily before breakfast.   furosemide  40 MG tablet Commonly known as: LASIX  Take 1 tablet (40 mg total) by mouth daily as needed.   ipratropium-albuterol  0.5-2.5 (3) MG/3ML Soln Commonly known as: DuoNeb Inhale 3 mLs into the lungs every 6 (six) hours as needed.   leptospermum manuka honey Pste paste Apply 1 Application topically daily. Apply to sacral wound Apply thin layer (3 mm) to wound.   levocetirizine 5 MG tablet Commonly known as: XYZAL  Take 1 tablet (5 mg total) by mouth every evening.   midodrine  5 MG tablet Commonly known as: PROAMATINE  Take 1 tablet (5 mg total)  by mouth 3 (three) times daily with meals.   montelukast  10 MG tablet Commonly known as: SINGULAIR  Take 1 tablet by mouth at bedtime.   multivitamin with minerals Tabs tablet Take 1 tablet by mouth daily.   omeprazole  40 MG capsule Commonly known as: PRILOSEC Take 1 capsule (40 mg total) by mouth daily.   oxyCODONE  5 MG immediate release tablet Commonly known as: Oxy IR/ROXICODONE  Take 1 tablet (5 mg total) by mouth every 4 (four) hours as needed for moderate pain (pain score 4-6) or severe pain (pain score 7-10).   QUEtiapine  25 MG tablet Commonly known as: SEROQUEL  Take 1 tablet (25 mg total) by mouth at bedtime.   rosuvastatin  5 MG tablet Commonly known as: CRESTOR  Take 1 tablet (5 mg total) by mouth  at bedtime.   Symbicort  160-4.5 MCG/ACT inhaler Generic drug: budesonide -formoterol  INHALE 2 PUFFS TWICE A DAY RINSE MOUTH WITH WATER AFTER EACH USE   theophylline  400 MG 24 hr tablet Commonly known as: UNIPHYL Take 1 tablet by mouth daily.   tiotropium 18 MCG inhalation capsule Commonly known as: SPIRIVA  Place 1 capsule into inhaler and inhale daily. pm   vancomycin  125 MG capsule Commonly known as: VANCOCIN  Take 1 capsule (125 mg total) by mouth 4 (four) times daily for 6 days.   Ventolin  HFA 108 (90 Base) MCG/ACT inhaler Generic drug: albuterol  INHALE 1 PUFF BY MOUTH AS NEEDED   zinc  sulfate (50mg  elemental zinc ) 220 (50 Zn) MG capsule Take 1 capsule (220 mg total) by mouth daily.               Discharge Care Instructions  (From admission, onward)           Start     Ordered   09/27/23 0000  Discharge wound care:       Comments: Cleanse sacral and buttock wounds with saline, pat dry Apply Medihoney to the sacral central wound (only to this wound) Cover each area with single layer of xeroform; top with foam. Change daily   09/27/23 1055             Follow-up Information     Sowles, Krichna, MD Follow up.   Specialty: Family  Medicine Why: hospital follow up Contact information: [address] [place] [address] [phone]                 Allergies  Allergen Reactions   Augmentin [Amoxicillin-Pot Clavulanate] Diarrhea   Penicillins Itching     Subjective: pt feeling okay this morning, no pain complaints, tolerating diet, diarrhea is improving but still some loose stool, no fever/chills, no CP/SOB   Discharge Exam: BP (!) 144/74 (BP Location: Left Arm)   Pulse 82   Temp 98.2 F (36.8 C)   Resp 16   Ht 5' 8 (1.727 m)   Wt 67.1 kg   SpO2 100%   BMI 22.49 kg/m  General: Pt is alert, awake, not in acute distress Cardiovascular: RRR, S1/S2 +, no rubs, no gallops Respiratory: CTA bilaterally, no wheezing, no rhonchi Abdominal: Soft, NT, ND, bowel sounds +hyperactive Extremities: no edema, no cyanosis     The results of significant diagnostics from this hospitalization (including imaging, microbiology, ancillary and laboratory) are listed below for reference.     Microbiology: Recent Results (from the past 240 hours)  Blood Culture (routine x 2)     Status: None   Collection Time: 09/22/23  8:24 PM   Specimen: Right Antecubital; Blood  Result Value Ref Range Status   Specimen Description RIGHT ANTECUBITAL  Final   Special Requests   Final    BOTTLES DRAWN AEROBIC AND ANAEROBIC Blood Culture adequate volume   Culture   Final    NO GROWTH 5 DAYS Performed at [facility], [address] [place], [place] [address]    Report Status 09/27/2023 FINAL  Final  Resp panel by RT-PCR (RSV, Flu A&B, Covid) Anterior Nasal Swab     Status: None   Collection Time: 09/22/23  8:39 PM   Specimen: Anterior Nasal Swab  Result Value Ref Range Status   SARS Coronavirus 2 by RT PCR NEGATIVE NEGATIVE Final    Comment: (NOTE) SARS-CoV-2 target nucleic acids are NOT DETECTED.  The SARS-CoV-2 RNA is generally detectable in upper respiratory  specimens during the acute  phase of infection. The lowest concentration of SARS-CoV-2 viral copies this assay can detect is 138 copies/mL. A negative result does not preclude SARS-Cov-2 infection and should not be used as the sole basis for treatment or other patient management decisions. A negative result may occur with  improper specimen collection/handling, submission of specimen other than nasopharyngeal swab, presence of viral mutation(s) within the areas targeted by this assay, and inadequate number of viral copies(<138 copies/mL). A negative result must be combined with clinical observations, patient history, and epidemiological information. The expected result is Negative.  Fact Sheet for Patients:  BloggerCourse.com  Fact Sheet for Healthcare Providers:  SeriousBroker.it  This test is no t yet approved or cleared by the [place]  FDA and  has been authorized for detection and/or diagnosis of SARS-CoV-2 by FDA under an Emergency Use Authorization (EUA). This EUA will remain  in effect (meaning this test can be used) for the duration of the COVID-19 declaration under Section 564(b)(1) of the Act, 21 U.S.C.section 360bbb-3(b)(1), unless the authorization is terminated  or revoked sooner.       Influenza A by PCR NEGATIVE NEGATIVE Final   Influenza B by PCR NEGATIVE NEGATIVE Final    Comment: (NOTE) The Xpert Xpress SARS-CoV-2/FLU/RSV plus assay is intended as an aid in the diagnosis of influenza from Nasopharyngeal swab specimens and should not be used as a sole basis for treatment. Nasal washings and aspirates are unacceptable for Xpert Xpress SARS-CoV-2/FLU/RSV testing.  Fact Sheet for Patients: BloggerCourse.com  Fact Sheet for Healthcare Providers: SeriousBroker.it  This test is not yet approved or cleared by the [place]  FDA and has been authorized for detection and/or diagnosis of  SARS-CoV-2 by FDA under an Emergency Use Authorization (EUA). This EUA will remain in effect (meaning this test can be used) for the duration of the COVID-19 declaration under Section 564(b)(1) of the Act, 21 U.S.C. section 360bbb-3(b)(1), unless the authorization is terminated or revoked.     Resp Syncytial Virus by PCR NEGATIVE NEGATIVE Final    Comment: (NOTE) Fact Sheet for Patients: BloggerCourse.com  Fact Sheet for Healthcare Providers: SeriousBroker.it  This test is not yet approved or cleared by the [place]  FDA and has been authorized for detection and/or diagnosis of SARS-CoV-2 by FDA under an Emergency Use Authorization (EUA). This EUA will remain in effect (meaning this test can be used) for the duration of the COVID-19 declaration under Section 564(b)(1) of the Act, 21 U.S.C. section 360bbb-3(b)(1), unless the authorization is terminated or revoked.  Performed at [facility], [address] [place], [place] [address]   Blood Culture (routine x 2)     Status: None   Collection Time: 09/22/23  8:39 PM   Specimen: BLOOD RIGHT ARM  Result Value Ref Range Status   Specimen Description BLOOD RIGHT ARM  Final   Special Requests   Final    BOTTLES DRAWN AEROBIC AND ANAEROBIC Blood Culture adequate volume   Culture   Final    NO GROWTH 5 DAYS Performed at [facility], [address] [place], [place] [address]    Report Status 09/27/2023 FINAL  Final  C Difficile Quick Screen w PCR reflex     Status: Abnormal   Collection Time: 09/23/23  9:14 AM   Specimen: STOOL  Result Value Ref Range Status   C Diff antigen POSITIVE (A) NEGATIVE Final   C Diff toxin NEGATIVE NEGATIVE Final   C Diff interpretation Results are indeterminate.  See PCR results.  Final    Comment: Performed at [facility], [address] [place], [place] [address]  C. Diff by PCR, Reflexed     Status: None    Collection Time: 09/23/23  9:14 AM  Result Value Ref Range Status   Toxigenic C. Difficile by PCR NEGATIVE NEGATIVE Final    Comment: Patient is colonized with non toxigenic C. difficile. May not need treatment unless significant symptoms are present.   Hypervirulent Strain PRESUMPTIVE NEGATIVE PRESUMPTIVE NEGATIVE Final    Comment: Performed at [facility], [address] [place], [place] [address]  Urine Culture     Status: Abnormal   Collection Time: 09/23/23  9:15 AM   Specimen: Urine, Random  Result Value Ref Range Status   Specimen Description   Final    URINE, RANDOM Performed at [facility], [address] [place], [place] [address]    Special Requests   Final    NONE Reflexed from [id number] Performed at [facility], [address] [place], [place] [address]    Culture   Final    Two isolates with different morphologies were identified as the same organism.The most resistant organism was reported. 80,000 COLONIES/mL ESCHERICHIA COLI    Report Status 09/26/2023 FINAL  Final   Organism ID, Bacteria ESCHERICHIA COLI (A)  Final      Susceptibility   Escherichia coli - MIC*    AMPICILLIN <=2 SENSITIVE Sensitive     CEFAZOLIN <=4 SENSITIVE Sensitive     CEFEPIME  <=0.12 SENSITIVE Sensitive     CEFTRIAXONE  <=0.25 SENSITIVE Sensitive     CIPROFLOXACIN  <=0.25 SENSITIVE Sensitive     GENTAMICIN <=1 SENSITIVE Sensitive     IMIPENEM <=0.25 SENSITIVE Sensitive     NITROFURANTOIN  32 SENSITIVE Sensitive     TRIMETH /SULFA  <=20 SENSITIVE Sensitive     AMPICILLIN/SULBACTAM <=2 SENSITIVE Sensitive     PIP/TAZO <=4 SENSITIVE Sensitive ug/mL    * 80,000 COLONIES/mL ESCHERICHIA COLI     Labs: BNP (last 3 results) No results for input(s): BNP in the last 8760 hours. Basic Metabolic Panel: Recent Labs  Lab 09/24/23 0355 09/25/23 0329 09/26/23 0318 09/27/23 0508 09/28/23 0400 09/29/23 0509  NA 136 135 137 138 136  --   K 3.4* 2.8* 3.6 3.3* 3.6  --    CL 110 108 108 107 108  --   CO2 22 22 23 23 24   --   GLUCOSE 139* 115* 88 89 109*  --   BUN 20 17 15 12 10   --   CREATININE 1.12* 1.01* 0.99 0.99 1.08* 0.95  CALCIUM  7.1* 7.1* 7.4* 7.5* 7.5*  --   MG 2.2 1.7 2.0 1.9 1.9  --   PHOS 3.0  --  3.8  --   --   --    Liver Function Tests: No results for input(s): AST, ALT, ALKPHOS, BILITOT, PROT, ALBUMIN in the last 168 hours.  No results for input(s): LIPASE, AMYLASE in the last 168 hours. No results for input(s): AMMONIA in the last 168 hours. CBC: Recent Labs  Lab 09/25/23 0329  WBC 5.9  NEUTROABS 4.0  HGB 9.2*  HCT 28.6*  MCV 100.4*  PLT 290   Cardiac Enzymes: No results for input(s): CKTOTAL, CKMB, CKMBINDEX, TROPONINI in the last 168 hours.  BNP: Invalid input(s): POCBNP CBG: Recent Labs  Lab 09/29/23 1258 09/29/23 1635 09/29/23 1805 09/29/23 2119 09/30/23 0755  GLUCAP 131* 64* 111* 137* 83   D-Dimer No results for  input(s): DDIMER in the last 72 hours. Hgb A1c No results for input(s): HGBA1C in the last 72 hours. Lipid Profile No results for input(s): CHOL, HDL, LDLCALC, TRIG, CHOLHDL, LDLDIRECT in the last 72 hours. Thyroid  function studies No results for input(s): TSH, T4TOTAL, T3FREE, THYROIDAB in the last 72 hours.  Invalid input(s): FREET3 Anemia work up No results for input(s): VITAMINB12, FOLATE, FERRITIN, TIBC, IRON, RETICCTPCT in the last 72 hours. Urinalysis    Component Value Date/Time   COLORURINE YELLOW (A) 09/23/2023 0915   APPEARANCEUR CLOUDY (A) 09/23/2023 0915   APPEARANCEUR Cloudy (A) 01/24/2015 0000   LABSPEC 1.012 09/23/2023 0915   LABSPEC 1.005 11/06/2012 2335   PHURINE 7.0 09/23/2023 0915   GLUCOSEU NEGATIVE 09/23/2023 0915   GLUCOSEU Negative 11/06/2012 2335   HGBUR SMALL (A) 09/23/2023 0915   BILIRUBINUR NEGATIVE 09/23/2023 0915   BILIRUBINUR Small 10/25/2021 1326   BILIRUBINUR Negative 01/24/2015 0000    BILIRUBINUR Negative 11/06/2012 2335   KETONESUR NEGATIVE 09/23/2023 0915   PROTEINUR 100 (A) 09/23/2023 0915   UROBILINOGEN 0.2 10/25/2021 1326   NITRITE NEGATIVE 09/23/2023 0915   LEUKOCYTESUR LARGE (A) 09/23/2023 0915   LEUKOCYTESUR 3+ 11/06/2012 2335   Sepsis Labs Recent Labs  Lab 09/25/23 0329  WBC 5.9   Microbiology Recent Results (from the past 240 hours)  Blood Culture (routine x 2)     Status: None   Collection Time: 09/22/23  8:24 PM   Specimen: Right Antecubital; Blood  Result Value Ref Range Status   Specimen Description RIGHT ANTECUBITAL  Final   Special Requests   Final    BOTTLES DRAWN AEROBIC AND ANAEROBIC Blood Culture adequate volume   Culture   Final    NO GROWTH 5 DAYS Performed at [facility], [address] [place], [place] [address]    Report Status 09/27/2023 FINAL  Final  Resp panel by RT-PCR (RSV, Flu A&B, Covid) Anterior Nasal Swab     Status: None   Collection Time: 09/22/23  8:39 PM   Specimen: Anterior Nasal Swab  Result Value Ref Range Status   SARS Coronavirus 2 by RT PCR NEGATIVE NEGATIVE Final    Comment: (NOTE) SARS-CoV-2 target nucleic acids are NOT DETECTED.  The SARS-CoV-2 RNA is generally detectable in upper respiratory specimens during the acute phase of infection. The lowest concentration of SARS-CoV-2 viral copies this assay can detect is 138 copies/mL. A negative result does not preclude SARS-Cov-2 infection and should not be used as the sole basis for treatment or other patient management decisions. A negative result may occur with  improper specimen collection/handling, submission of specimen other than nasopharyngeal swab, presence of viral mutation(s) within the areas targeted by this assay, and inadequate number of viral copies(<138 copies/mL). A negative result must be combined with clinical observations, patient history, and epidemiological information. The expected result is Negative.  Fact Sheet for  Patients:  BloggerCourse.com  Fact Sheet for Healthcare Providers:  SeriousBroker.it  This test is no t yet approved or cleared by the [place]  FDA and  has been authorized for detection and/or diagnosis of SARS-CoV-2 by FDA under an Emergency Use Authorization (EUA). This EUA will remain  in effect (meaning this test can be used) for the duration of the COVID-19 declaration under Section 564(b)(1) of the Act, 21 U.S.C.section 360bbb-3(b)(1), unless the authorization is terminated  or revoked sooner.       Influenza A by PCR NEGATIVE NEGATIVE Final   Influenza B by PCR NEGATIVE NEGATIVE Final  Comment: (NOTE) The Xpert Xpress SARS-CoV-2/FLU/RSV plus assay is intended as an aid in the diagnosis of influenza from Nasopharyngeal swab specimens and should not be used as a sole basis for treatment. Nasal washings and aspirates are unacceptable for Xpert Xpress SARS-CoV-2/FLU/RSV testing.  Fact Sheet for Patients: BloggerCourse.com  Fact Sheet for Healthcare Providers: SeriousBroker.it  This test is not yet approved or cleared by the [place]  FDA and has been authorized for detection and/or diagnosis of SARS-CoV-2 by FDA under an Emergency Use Authorization (EUA). This EUA will remain in effect (meaning this test can be used) for the duration of the COVID-19 declaration under Section 564(b)(1) of the Act, 21 U.S.C. section 360bbb-3(b)(1), unless the authorization is terminated or revoked.     Resp Syncytial Virus by PCR NEGATIVE NEGATIVE Final    Comment: (NOTE) Fact Sheet for Patients: BloggerCourse.com  Fact Sheet for Healthcare Providers: SeriousBroker.it  This test is not yet approved or cleared by the [place]  FDA and has been authorized for detection and/or diagnosis of SARS-CoV-2 by FDA under an Emergency  Use Authorization (EUA). This EUA will remain in effect (meaning this test can be used) for the duration of the COVID-19 declaration under Section 564(b)(1) of the Act, 21 U.S.C. section 360bbb-3(b)(1), unless the authorization is terminated or revoked.  Performed at [facility], [address] [place], [place] [address]   Blood Culture (routine x 2)     Status: None   Collection Time: 09/22/23  8:39 PM   Specimen: BLOOD RIGHT ARM  Result Value Ref Range Status   Specimen Description BLOOD RIGHT ARM  Final   Special Requests   Final    BOTTLES DRAWN AEROBIC AND ANAEROBIC Blood Culture adequate volume   Culture   Final    NO GROWTH 5 DAYS Performed at [facility], [address], [place], [place] [address]    Report Status 09/27/2023 FINAL  Final  C Difficile Quick Screen w PCR reflex     Status: Abnormal   Collection Time: 09/23/23  9:14 AM   Specimen: STOOL  Result Value Ref Range Status   C Diff antigen POSITIVE (A) NEGATIVE Final   C Diff toxin NEGATIVE NEGATIVE Final   C Diff interpretation Results are indeterminate. See PCR results.  Final    Comment: Performed at [facility], [address] [place], [place] [address]  C. Diff by PCR, Reflexed     Status: None   Collection Time: 09/23/23  9:14 AM  Result Value Ref Range Status   Toxigenic C. Difficile by PCR NEGATIVE NEGATIVE Final    Comment: Patient is colonized with non toxigenic C. difficile. May not need treatment unless significant symptoms are present.   Hypervirulent Strain PRESUMPTIVE NEGATIVE PRESUMPTIVE NEGATIVE Final    Comment: Performed at [facility], [address] [place], [place] [address]  Urine Culture     Status: Abnormal   Collection Time: 09/23/23  9:15 AM   Specimen: Urine, Random  Result Value Ref Range Status   Specimen Description   Final    URINE, RANDOM Performed at [facility], [address] [place], [place] [address]    Special  Requests   Final    NONE Reflexed from [id number] Performed at [facility], [address] [place], [place] [address]    Culture   Final    Two isolates with different morphologies were identified as the same organism.The most resistant organism was reported. 80,000 COLONIES/mL ESCHERICHIA  COLI    Report Status 09/26/2023 FINAL  Final   Organism ID, Bacteria ESCHERICHIA COLI (A)  Final      Susceptibility   Escherichia coli - MIC*    AMPICILLIN <=2 SENSITIVE Sensitive     CEFAZOLIN <=4 SENSITIVE Sensitive     CEFEPIME  <=0.12 SENSITIVE Sensitive     CEFTRIAXONE  <=0.25 SENSITIVE Sensitive     CIPROFLOXACIN  <=0.25 SENSITIVE Sensitive     GENTAMICIN <=1 SENSITIVE Sensitive     IMIPENEM <=0.25 SENSITIVE Sensitive     NITROFURANTOIN  32 SENSITIVE Sensitive     TRIMETH /SULFA  <=20 SENSITIVE Sensitive     AMPICILLIN/SULBACTAM <=2 SENSITIVE Sensitive     PIP/TAZO <=4 SENSITIVE Sensitive ug/mL    * 80,000 COLONIES/mL ESCHERICHIA COLI   Imaging CT CHEST WO CONTRAST Result Date: 09/23/2023 CLINICAL DATA:  History of fall altered mental status, question interstitial lung disease EXAM: CT CHEST WITHOUT CONTRAST TECHNIQUE: Multidetector CT imaging of the chest was performed following the standard protocol without IV contrast. RADIATION DOSE REDUCTION: This exam was performed according to the departmental dose-optimization program which includes automated exposure control, adjustment of the mA and/or kV according to patient size and/or use of iterative reconstruction technique. COMPARISON:  Chest x-ray 09/22/2023, chest CT 08/22/2022, 05/16/2022, 09/10/2021 FINDINGS: Cardiovascular: Limited evaluation without intravenous contrast. Moderate aortic atherosclerosis. No aneurysm. Coronary vascular calcification. Normal cardiac size. Trace pericardial effusion Mediastinum/Nodes: Patent trachea. Mildly prominent precarinal lymph node measuring 10 mm. Hypodense right thyroid  nodule measuring 2.6 cm This has  been evaluated on previous imaging. (ref: J Am Coll Radiol. 2015 Feb;12(2): 143-50). Esophagus within normal limits. Lungs/Pleura: Emphysema. No pleural effusion or pneumothorax. Interstitial densities and mild reticulation within the upper lobes and right middle lobe suggestive of post infectious or post inflammatory scarring. No acute consolidative airspace disease. 3 mm right upper lobe pulmonary nodule on series 3, image 48, not clearly seen on the prior exam. Upper Abdomen: Hepatic steatosis. Incompletely visualized new moderate severe bilateral hydronephrosis. Multiple gallstones. Musculoskeletal: No acute or suspicious osseous abnormality IMPRESSION: 1. Emphysema with interstitial densities and mild reticulation within the upper lobes and right middle lobe suggestive of post infectious or post inflammatory scarring. No acute consolidative airspace disease. 2. 3 mm right upper lobe pulmonary nodule, not clearly seen on the prior exam. No follow-up needed if patient is low-risk.This recommendation follows the consensus statement: Guidelines for Management of Incidental Pulmonary Nodules Detected on CT Images: From the Fleischner Society 2017; Radiology 2017; 284:228-243. 3. 4. Hepatic steatosis. 5. Cholelithiasis. 6. Aortic atherosclerosis. Aortic Atherosclerosis (ICD10-I70.0) and Emphysema (ICD10-J43.9). Electronically Signed   By: Luke Bun M.D.   On: 09/23/2023 00:28   DG Chest Port 1 View Result Date: 09/22/2023 EXAM: 1 VIEW XRAY OF THE CHEST 09/22/2023 08:55:28 PM COMPARISON: 09/08/2021 and 08/22/2022. CLINICAL HISTORY: Questionable sepsis - evaluate for abnormality. PER ER NOTE; [facility] with complaints of fall and altered mental status. Patient fell down around 7am this morning and has been in the floor since then as she was unable to get herself up off the floor. Husband and son ; in law waited to call 911 until tonight. Presents with lac to forehead  which she states happened days ago. EMS also reports she has bed sore on her back that appears infected. FINDINGS: LUNGS AND PLEURA: Patchy airspace opacities in the right mid and lower lung may be due to atelectasis or infiltrates. Consider CT for further evaluation. No pleural effusion or pneumothorax. HEART  AND MEDIASTINUM: Stable cardiomediastinal silhouette. BONES AND SOFT TISSUES: No acute osseous abnormality. IMPRESSION: 1. Patchy airspace opacities in the right mid and lower lung, possibly due to atelectasis or infiltrates. Consider CT for further evaluation. 2. No pleural effusion or pneumothorax. Electronically signed by: Norman Gatlin MD 09/22/2023 09:09 PM EDT RP Workstation: [id number]      Time coordinating discharge: over 30 minutes  SIGNED:  Arrington Yohe DO [facility]

## 2023-09-30 NOTE — Progress Notes (Signed)
 Occupational Therapy Treatment Patient Details Name: Tricia [name] MRN: [id number] DOB: 1961/01/15 Today's Date: 09/30/2023   History of present illness Pt is a 63 y/o female admitted secondary to a fall and AMS. Pt was found down after several hours. Pt found to have CAP, SIRS and ?sepsis. Patient's recent hospital stay was complicated by urinary retention requiring Foley and a DSS referral due to concerns for inadequate care at home.  She also had episodes of hypotension treated with midodrine . 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   OT comments  Pt seen for OT treatment on this date. Upon arrival to room pt supine in bed with HOB elevated, agreeable to tx. Pt requires decreased assistance for bed mobility and for ADL transfers.  Pt demonstrated overall improvement in LB ADLs on this session, tolerating standing tasks with greater independence, functional balance, and safety awareness.  Ongoing barriers include safe engagement in ADLs without supervision to occasional Min A support and cuing for redirection to ensure completeness of task engagement.  Pt remains a fall risk even with application of RW for functional mobility and standing components of ADLs however  Pt making good progress toward goals, will continue to follow POC. Discharge recommendation remains appropriate.         If plan is discharge home, recommend the following:  A little help with walking and/or transfers;A lot of help with bathing/dressing/bathroom;Direct supervision/assist for medications management;Supervision due to cognitive status;Direct supervision/assist  for financial management;Assistance with cooking/housework;Assist for transportation;Help with stairs or ramp for entrance   Equipment Recommendations  BSC/3in1;Other (comment) (can defer to next venue of care)    Recommendations for Other Services      Precautions / Restrictions Precautions Precautions: Fall Recall of Precautions/Restrictions: Impaired Restrictions Other Position/Activity Restrictions: Orthostatic Hypotension       Mobility Bed Mobility Overal bed mobility: Needs Assistance Bed Mobility: Supine to Sit, Sit to Supine Rolling: Min assist   Supine to sit: Min assist, HOB elevated, Used rails Sit to supine: Min assist   General bed mobility comments: bed features and increased time    Transfers Overall transfer level: Needs assistance Equipment used: Rolling walker (2 wheels) Transfers: Sit to/from Stand Sit to Stand: Contact guard assist, From elevated surface, Min assist     Step pivot transfers: Supervision, Contact guard assist     General transfer comment: improved safety awareness noted during today's session.     Balance Overall balance assessment: Needs assistance Sitting-balance support: Feet supported Sitting balance-Leahy Scale: Good     Standing balance support: Single extremity supported, Bilateral upper extremity supported, During functional activity, Reliant on assistive device for balance Standing balance-Leahy Scale: Fair                             ADL either performed or assessed with clinical judgement   ADL Overall ADL's : Needs assistance/impaired Eating/Feeding: Independent;Set up   Grooming: Wash/dry hands;Supervision/safety;Standing Grooming Details (indicate cue type and reason): Standing at sink at RW level             Lower Body Dressing: Minimal assistance;Sit to/from stand Lower Body Dressing  Details (indicate cue type and reason): Min A for threading undergarment over feet while seated,  Supervision for standing to pull over hips with use of RW for stability Toilet Transfer: Supervision/safety;Rolling walker (2 wheels);BSC/3in1   Toileting- Clothing Manipulation and Hygiene: Supervision/safety;Contact guard assist;Sit to/from stand Toileting - Clothing Manipulation Details (indicate cue type and reason): Peri care completed in standing at RW level with single UE for support with posterior perineal hygiene/care, CGA for far reaching to posterior however Supervision/SBA for anterior tasks.     Functional mobility during ADLs: Contact guard assist;Supervision/safety General ADL Comments: Improvements in dynamic standing balance noted to support standing component of ADLs    Extremity/Trunk Assessment              Vision       Perception     Praxis     Communication Communication Communication: No apparent difficulties   Cognition Arousal: Alert Behavior During Therapy: WFL for tasks assessed/performed Cognition: No family/caregiver present to determine baseline                               Following commands: Intact Following commands impaired: Follows one step commands with increased time      Cueing   Cueing Techniques: Verbal cues, Tactile cues  Exercises Other Exercises Other Exercises: Education and training on safety awareness during standing components of ADLs and ADL transfers    Shoulder Instructions       General Comments      Pertinent Vitals/ Pain       Pain Assessment Pain Assessment: Faces Faces Pain Scale: Hurts a little bit Pain Location: abdomen with ongoing loose stools Pain Descriptors / Indicators: Discomfort Pain Intervention(s): Monitored during session  Home Living                                          Prior Functioning/Environment              Frequency  Min 2X/week        Progress Toward Goals  OT Goals(current goals can now be found in the care plan section)  Progress  towards OT goals: Progressing toward goals     Plan      Co-evaluation                 AM-PAC OT 6 Clicks Daily Activity     Outcome Measure   Help from another person eating meals?: None Help from another person taking care of personal grooming?: None Help from another person toileting, which includes using toliet, bedpan, or urinal?: A Little Help from another person bathing (including washing, rinsing, drying)?: A Little Help from another person to put on and taking off regular upper body clothing?: None Help from another person to put on and taking off regular lower body clothing?: A Little 6 Click Score: 21    End of Session Equipment Utilized During Treatment: Rolling walker (2 wheels);Gait belt  OT Visit Diagnosis: Unsteadiness on feet (R26.81);Muscle weakness (generalized) (M62.81);History of falling (Z91.81)   Activity Tolerance Patient tolerated treatment well   Patient Left in bed;with call bell/phone within reach;with bed alarm set   Nurse Communication Mobility status        Time: 9145-9054 OT Time Calculation (min): 51 min  Charges: OT General Charges $OT Visit: 1 Visit OT Treatments $Self  Care/Home Management : 38-52 mins  Harlene Sharps OTR/L   Harlene LITTIE Sharps 09/30/2023, 10:03 AM

## 2023-09-30 NOTE — TOC Transition Note (Signed)
 Transition of Care [facility]) - Discharge Note   Patient Details  Name: Tricia [name] MRN: [id number] Date of Birth: 25-Mar-1960  Transition of [facility]) CM/SW Contact:  Dalia GORMAN Fuse, RN Phone Number: 09/30/2023, 11:52 AM   Clinical Narrative:     Patient is medically ready for discharge to [facility] for STR. The patient is agreeable with the discharge plan. [facility] will transport. The DC Summary was sent to the facility via the hub.  Final next level of care: Skilled Nursing Facility Barriers to Discharge: Barriers Resolved   Patient Goals and CMS Choice   CMS Medicare.gov Compare Post Acute Care list provided to:: Patient Choice offered to / list presented to : Patient      Discharge Placement              Patient chooses bed at: [facility] [place] Patient to be transferred to facility by: [facility]   Patient and family notified of of transfer: 09/30/23  Discharge Plan and Services Additional resources added to the After Visit Summary for     Discharge Planning Services: CM Consult                                 Social Drivers of Health (SDOH) Interventions SDOH Screenings   Food Insecurity: No Food Insecurity (09/22/2023)  Housing: Low Risk  (09/22/2023)  Transportation Needs: No Transportation Needs (09/22/2023)  Utilities: Not At Risk (09/22/2023)  Alcohol Screen: Low Risk  (03/07/2023)  Depression (PHQ2-9): High Risk (05/01/2023)  Financial Resource Strain: Patient Declined (06/18/2023)   Received from [facility]  Physical Activity: Sufficiently Active (03/07/2023)  Social Connections: Socially Isolated (08/29/2023)  Stress: No Stress Concern Present (03/07/2023)  Tobacco Use: High Risk (09/26/2023)  Health Literacy: Adequate Health Literacy (03/07/2023)     Readmission Risk Interventions     No data to display

## 2023-09-30 NOTE — Inpatient Diabetes Management (Signed)
 Inpatient Diabetes Program Recommendations  AACE/ADA: New Consensus Statement on Inpatient Glycemic Control (2015)  Target Ranges:  Prepandial:   less than 140 mg/dL      Peak postprandial:   less than 180 mg/dL (1-2 hours)      Critically ill patients:  140 - 180 mg/dL    Latest Reference Range & Units 09/29/23 08:44 09/29/23 10:33 09/29/23 12:58 09/29/23 16:35 09/29/23 18:05 09/29/23 21:19 09/30/23 07:55  Glucose-Capillary 70 - 99 mg/dL 65 (L) 898 (H) 868 (H) 64 (L) 111 (H) 137 (H) 83   Review of Glycemic Control  Current orders for Inpatient glycemic control: Jardiance  25 mg daily, Novolog  0-9 units TID with meals  Inpatient Diabetes Program Recommendations:    Insulin : CBGs have ranged from 64-131 mg/dl over the past 24 hours.  Please consider decreasing Novolog  correction to 0-6 units TID with meals.  Thanks, Earnie Gainer, RN, MSN, CDCES Diabetes Coordinator Inpatient Diabetes Program [phone] (Team Pager from 8am to 5pm)

## 2023-10-01 ENCOUNTER — Ambulatory Visit: Admitting: Family Medicine

## 2023-10-02 NOTE — Discharge Summary (Signed)
 Physician Discharge Summary   [name] MRN: [id number] DOB: 1960-08-27  Admit date:     08/28/2023  Discharge date: 09/18/2023  Discharge Physician: Nena Rebel   PCP: Glenard Mire, MD   Recommendations at discharge:    Left AMA  Discharge Diagnoses: Principal Problem:   Acute kidney injury superimposed on chronic kidney disease (HCC) Active Problems:   Chronic obstructive pulmonary disease (COPD) (HCC)   Syncope, vasovagal   Elevated CK   Dyslipidemia   GERD without esophagitis   Parkinson disease (HCC)   Anxiety and depression   Type 2 diabetes mellitus with stage 3 chronic kidney disease (HCC)   Malnutrition of moderate degree   Protein-calorie malnutrition, severe  Resolved Problems:   * No resolved hospital problems. *  Hospital Course: [name] is a 63 y.o. female with chronic pain, COPD, asthma, osteoarthritis, anxiety, depression, type 2 diabetes, diastolic CHF, GERD, hypertension, dyslipidemia, who presents to the ED with syncopal episode.  Initial vitals in the ED are 125/100, creatinine elevation of 2.55, CK  659.  Noncon head CT without acute intracranial abnormalities.  Patient received 2 L NS and was admitted.  Gradually her mental status has improved but she remains intermittently altered.  We have significant concerns about her ability to take care of herself at home.    7/23: Remained hemodynamically stable.  No TOC note for placement-sent another message.    7/24: Remained hemodynamically stable, patient does not have capacity to make decision per psychiatry-she was evaluated on 7/15 for capacity.  She wants to go home but unsafe environment with frequent falls and mixing up of medications.  Another message sent to [facility] as she is medically stable for the past many days with unnecessary length of stay now.   7/25: Hemodynamically stable but continued to have diarrhea-GI pathogen panel was negative.  No leukocytosis, mild  hypokalemia which is being repleted.  Borderline magnesium  at 1.8.  B12 and folate levels were normal, vitamin D  low-starting on supplement. TOC still need to work on placement.  Patient remained medically stable for discharge.   7/26: Hemodynamically stable with mildly elevated blood pressure so decreasing the dose of midodrine  to 5 mg 3 times daily.  Discontinuing pyridostigmine  as it might be the cause of diarrhea and abdominal cramps.  Patient would like to discontinue Foley catheter with the hope that she can void now, ordered removal of Foley catheter to give her a voiding trial.   Overnight fall while getting out of the bed, patient felt in her recliner-no reported injuries or loss of consciousness.   7/27: Continued to have crampy abdominal pain, significant urinary retention with postvoid volume of about 700 cc-one-time In-N-Out catheter was ordered, might need to replace Foley if she continued to have significant postvoid volume.  Patient wants to avoid catheter placement if possible. UA was sent as nursing concern of some purulence during In-N-Out catheter-UA pretty much the same as before with no leukocytosis so we will hold off antibiotics.   7/28: Hemodynamically stable with improving abdominal cramp and diarrhea.  Continue to have urinary retention so Foley catheter was replaced overnight. Still awaiting placement-apparently she has to go through guardianship protocol.   7/29: Remained hemodynamically stable.  Wants to go home, talked with son-in-law again today and he now agreed for her to come home stating that he can take care of her.  Discussed with TOC, they need to clarify it now with DSS as they are involved.  If they  cleared her patient can go home with home health.  7/30: TOC working with the DSS, I still do not have an answer whether we can safely discharge this patient home or not.  Waiting on TOC/DSS to clear for discharge home.  Patient was awaiting to get clearance from  DSS and case management.  Patient decided to leave AGAINST MEDICAL ADVICE saying that she cannot wait until the clearance.  Assessment and Plan: * Acute kidney injury superimposed on chronic kidney disease (HCC) - The patient will be admitted to a medical telemetry observation bed. - This could be prerenal AKI due to volume depletion and dehydration. - Will continue hydration with IV normal saline. - Will avoid nephrotoxins. - Will follow BMPs.  Chronic obstructive pulmonary disease (COPD) (HCC) - Will continue her inhalers.  Syncope, vasovagal - Will check orthostatics q 12 hours. - Will obtain a bilateral 2D echo. - The patient will be gently hydrated with IV normal saline and monitored for arrhythmias. -Differential diagnoses would include neurally mediated syncope, cardiogenic, arrhythmias related,  orthostatic hypotension and less likely hypoglycemia.    Elevated CK - This is secondary to her fall. - She will be hydrated with IV normal saline. - Will follow CK levels.  GERD without esophagitis - Will continue PPI therapy.  Dyslipidemia - Will hold off statin therapy given rhabdomyolysis  Type 2 diabetes mellitus with stage 3 chronic kidney disease (HCC) - The patient will be placed on supplemental coverage with NovoLog . - Will continue Jardiance   Anxiety and depression - Will continue Cymbalta  and Seroquel  as well as BuSpar  and hydroxyzine .  Parkinson disease (HCC) Will continue Sinemet  IR.        Pain control - [place]  Controlled Substance Reporting System database was reviewed. and patient was instructed, not to drive, operate heavy machinery, perform activities at heights, swimming or participation in water activities or provide baby-sitting services while on Pain, Sleep and Anxiety Medications; until their outpatient Physician has advised to do so again. Also recommended to not to take more than prescribed Pain, Sleep and Anxiety Medications.   Consultants: Wound care Procedures performed:   Disposition: Skilled nursing facility Diet recommendation:  Regular diet DISCHARGE MEDICATION: Allergies as of 09/18/2023       Reactions   Augmentin [amoxicillin-pot Clavulanate] Diarrhea   Penicillins Itching        Medication List     ASK your doctor about these medications    aspirin  EC 81 MG tablet Take 81 mg by mouth daily. Swallow whole.   carbidopa -levodopa  25-100 MG tablet Commonly known as: SINEMET  IR Take 1.5 tablets by mouth 3 (three) times daily.   carbidopa -levodopa  50-200 MG tablet Commonly known as: SINEMET  CR Take 1 tablet by mouth at bedtime.   cyclobenzaprine  10 MG tablet Commonly known as: FLEXERIL  Take 10 mg by mouth at bedtime.   DULoxetine  60 MG capsule Commonly known as: CYMBALTA  Take 1 capsule (60 mg total) by mouth daily.   empagliflozin  25 MG Tabs tablet Commonly known as: Jardiance  Take 1 tablet (25 mg total) by mouth daily before breakfast.   furosemide  40 MG tablet Commonly known as: LASIX  Take 1 tablet (40 mg total) by mouth daily as needed.   ipratropium-albuterol  0.5-2.5 (3) MG/3ML Soln Commonly known as: DuoNeb Inhale 3 mLs into the lungs every 6 (six) hours as needed.   levocetirizine 5 MG tablet Commonly known as: XYZAL  Take 1 tablet (5 mg total) by mouth every evening.   montelukast  10 MG tablet Commonly known  as: SINGULAIR  Take 1 tablet by mouth at bedtime.   omeprazole  40 MG capsule Commonly known as: PRILOSEC Take 1 capsule (40 mg total) by mouth daily.   QUEtiapine  25 MG tablet Commonly known as: SEROQUEL  Take 1 tablet (25 mg total) by mouth at bedtime.   rosuvastatin  5 MG tablet Commonly known as: CRESTOR  Take 1 tablet (5 mg total) by mouth at bedtime.   Symbicort  160-4.5 MCG/ACT inhaler Generic drug: budesonide -formoterol  INHALE 2 PUFFS TWICE A DAY RINSE MOUTH WITH WATER AFTER EACH USE   theophylline  400 MG 24 hr tablet Commonly known as:  UNIPHYL Take 1 tablet by mouth daily.   tiotropium 18 MCG inhalation capsule Commonly known as: SPIRIVA  Place 1 capsule into inhaler and inhale daily. pm   Ventolin  HFA 108 (90 Base) MCG/ACT inhaler Generic drug: albuterol  INHALE 1 PUFF BY MOUTH AS NEEDED        Discharge Exam: Filed Weights   09/16/23 0500 09/17/23 0500 09/18/23 0500  Weight: 67.1 kg 69.1 kg 65.8 kg   Constitutional: Alert, awake, calm, comfortable HEENT: Neck supple Respiratory: clear to auscultation bilaterally, no wheezing, no crackles. Normal respiratory effort. No accessory muscle use.  Cardiovascular: Regular rate and rhythm, no murmurs / rubs / gallops. No extremity edema. 2+ pedal pulses. No carotid bruits.  Abdomen: no tenderness, no masses palpated. No hepatosplenomegaly. Bowel sounds positive.  Musculoskeletal: no clubbing / cyanosis. No joint deformity upper and lower extremities. Good ROM, no contractures. Normal muscle tone.  Skin: no rashes, lesions, ulcers. No induration Neurologic: CN 2-12 grossly intact. Sensation intact, DTR normal. Strength 5/5 x all 4 extremities.  Psychiatric: Normal judgment and insight. Alert and oriented x 3. Normal mood.    Condition at discharge: fair  The results of significant diagnostics from this hospitalization (including imaging, microbiology, ancillary and laboratory) are listed below for reference.   Imaging Studies: CT CHEST WO CONTRAST Result Date: 09/23/2023 CLINICAL DATA:  History of fall altered mental status, question interstitial lung disease EXAM: CT CHEST WITHOUT CONTRAST TECHNIQUE: Multidetector CT imaging of the chest was performed following the standard protocol without IV contrast. RADIATION DOSE REDUCTION: This exam was performed according to the departmental dose-optimization program which includes automated exposure control, adjustment of the mA and/or kV according to patient size and/or use of iterative reconstruction technique. COMPARISON:   Chest x-ray 09/22/2023, chest CT 08/22/2022, 05/16/2022, 09/10/2021 FINDINGS: Cardiovascular: Limited evaluation without intravenous contrast. Moderate aortic atherosclerosis. No aneurysm. Coronary vascular calcification. Normal cardiac size. Trace pericardial effusion Mediastinum/Nodes: Patent trachea. Mildly prominent precarinal lymph node measuring 10 mm. Hypodense right thyroid  nodule measuring 2.6 cm This has been evaluated on previous imaging. (ref: J Am Coll Radiol. 2015 Feb;12(2): 143-50). Esophagus within normal limits. Lungs/Pleura: Emphysema. No pleural effusion or pneumothorax. Interstitial densities and mild reticulation within the upper lobes and right middle lobe suggestive of post infectious or post inflammatory scarring. No acute consolidative airspace disease. 3 mm right upper lobe pulmonary nodule on series 3, image 48, not clearly seen on the prior exam. Upper Abdomen: Hepatic steatosis. Incompletely visualized new moderate severe bilateral hydronephrosis. Multiple gallstones. Musculoskeletal: No acute or suspicious osseous abnormality IMPRESSION: 1. Emphysema with interstitial densities and mild reticulation within the upper lobes and right middle lobe suggestive of post infectious or post inflammatory scarring. No acute consolidative airspace disease. 2. 3 mm right upper lobe pulmonary nodule, not clearly seen on the prior exam. No follow-up needed if patient is low-risk.This recommendation follows the consensus statement: Guidelines for Management of Incidental Pulmonary  Nodules Detected on CT Images: From the Fleischner Society 2017; Radiology 2017; 284:228-243. 3. 4. Hepatic steatosis. 5. Cholelithiasis. 6. Aortic atherosclerosis. Aortic Atherosclerosis (ICD10-I70.0) and Emphysema (ICD10-J43.9). Electronically Signed   By: Luke Bun M.D.   On: 09/23/2023 00:28   DG Chest Port 1 View Result Date: 09/22/2023 EXAM: 1 VIEW XRAY OF THE CHEST 09/22/2023 08:55:28 PM COMPARISON: 09/08/2021 and  08/22/2022. CLINICAL HISTORY: Questionable sepsis - evaluate for abnormality. PER ER NOTE; Patient brought in via [place] today from home with complaints of fall and altered mental status. Patient fell down around 7am this morning and has been in the floor since then as she was unable to get herself up off the floor. Husband and son ; in law waited to call 911 until tonight. Presents with lac to forehead which she states happened days ago. EMS also reports she has bed sore on her back that appears infected. FINDINGS: LUNGS AND PLEURA: Patchy airspace opacities in the right mid and lower lung may be due to atelectasis or infiltrates. Consider CT for further evaluation. No pleural effusion or pneumothorax. HEART AND MEDIASTINUM: Stable cardiomediastinal silhouette. BONES AND SOFT TISSUES: No acute osseous abnormality. IMPRESSION: 1. Patchy airspace opacities in the right mid and lower lung, possibly due to atelectasis or infiltrates. Consider CT for further evaluation. 2. No pleural effusion or pneumothorax. Electronically signed by: Norman Gatlin MD 09/22/2023 09:09 PM EDT RP Workstation: [id number]    Microbiology: Results for orders placed or performed during the hospital encounter of 08/28/23  MRSA Next Gen by PCR, Nasal     Status: Abnormal   Collection Time: 08/29/23  5:20 PM   Specimen: Nasal Mucosa; Nasal Swab  Result Value Ref Range Status   MRSA by PCR Next Gen DETECTED (A) NOT DETECTED Final    Comment: RESULT CALLED TO, READ BACK BY AND VERIFIED WITH: BYNUM, JOYYA D., LPN @2044  08/30/2023 COP (NOTE) The GeneXpert MRSA Assay (FDA approved for NASAL specimens only), is one component of a comprehensive MRSA colonization surveillance program. It is not intended to diagnose MRSA infection nor to guide or monitor treatment for MRSA infections. Test performance is not FDA approved in patients less than 45 years old. Performed at [facility], [address]  [place], [place] [address]   Urine Culture (for pregnant, neutropenic or urologic patients or patients with an indwelling urinary catheter)     Status: Abnormal   Collection Time: 08/31/23  9:08 AM   Specimen: Urine, Catheterized  Result Value Ref Range Status   Specimen Description   Final    URINE, CATHETERIZED Performed at [facility], [address] [place], [place] [address]    Special Requests   Final    NONE Performed at [facility], [address] [place], [place] [address]    Culture (A)  Final    >=100,000 COLONIES/mL KLEBSIELLA PNEUMONIAE 10,000 COLONIES/mL PROTEUS MIRABILIS    Report Status 09/03/2023 FINAL  Final   Organism ID, Bacteria KLEBSIELLA PNEUMONIAE (A)  Final   Organism ID, Bacteria PROTEUS MIRABILIS (A)  Final      Susceptibility   Klebsiella pneumoniae - MIC*    AMPICILLIN RESISTANT Resistant     CEFAZOLIN <=4 SENSITIVE Sensitive     CEFEPIME  <=0.12 SENSITIVE Sensitive     CEFTRIAXONE  <=0.25 SENSITIVE Sensitive     CIPROFLOXACIN  <=0.25 SENSITIVE Sensitive     GENTAMICIN <=1 SENSITIVE Sensitive     IMIPENEM <=0.25 SENSITIVE Sensitive     NITROFURANTOIN  64  INTERMEDIATE Intermediate     TRIMETH /SULFA  <=20 SENSITIVE Sensitive     AMPICILLIN/SULBACTAM <=2 SENSITIVE Sensitive     PIP/TAZO <=4 SENSITIVE Sensitive ug/mL    * >=100,000 COLONIES/mL KLEBSIELLA PNEUMONIAE   Proteus mirabilis - MIC*    AMPICILLIN <=2 SENSITIVE Sensitive     CEFAZOLIN <=4 SENSITIVE Sensitive     CEFEPIME  <=0.12 SENSITIVE Sensitive     CEFTRIAXONE  <=0.25 SENSITIVE Sensitive     CIPROFLOXACIN  <=0.25 SENSITIVE Sensitive     GENTAMICIN <=1 SENSITIVE Sensitive     IMIPENEM 4 SENSITIVE Sensitive     NITROFURANTOIN  256 RESISTANT Resistant     TRIMETH /SULFA  <=20 SENSITIVE Sensitive     AMPICILLIN/SULBACTAM <=2 SENSITIVE Sensitive     PIP/TAZO <=4 SENSITIVE Sensitive ug/mL    * 10,000 COLONIES/mL PROTEUS MIRABILIS  Gastrointestinal Panel by PCR , Stool     Status:  None   Collection Time: 09/01/23 12:50 AM   Specimen: Stool  Result Value Ref Range Status   Campylobacter species NOT DETECTED NOT DETECTED Final   Plesimonas shigelloides NOT DETECTED NOT DETECTED Final   Salmonella species NOT DETECTED NOT DETECTED Final   Yersinia enterocolitica NOT DETECTED NOT DETECTED Final   Vibrio species NOT DETECTED NOT DETECTED Final   Vibrio cholerae NOT DETECTED NOT DETECTED Final   Enteroaggregative E coli (EAEC) NOT DETECTED NOT DETECTED Final   Enteropathogenic E coli (EPEC) NOT DETECTED NOT DETECTED Final   Enterotoxigenic E coli (ETEC) NOT DETECTED NOT DETECTED Final   Shiga like toxin producing E coli (STEC) NOT DETECTED NOT DETECTED Final   Shigella/Enteroinvasive E coli (EIEC) NOT DETECTED NOT DETECTED Final   Cryptosporidium NOT DETECTED NOT DETECTED Final   Cyclospora cayetanensis NOT DETECTED NOT DETECTED Final   Entamoeba histolytica NOT DETECTED NOT DETECTED Final   Giardia lamblia NOT DETECTED NOT DETECTED Final   Adenovirus F40/41 NOT DETECTED NOT DETECTED Final   Astrovirus NOT DETECTED NOT DETECTED Final   Norovirus GI/GII NOT DETECTED NOT DETECTED Final   Rotavirus A NOT DETECTED NOT DETECTED Final   Sapovirus (I, II, IV, and V) NOT DETECTED NOT DETECTED Final    Comment: Performed at [facility], [address] [place], [place] [address]  Gastrointestinal Panel by PCR , Stool     Status: None   Collection Time: 09/13/23  9:56 AM   Specimen: Stool  Result Value Ref Range Status   Campylobacter species NOT DETECTED NOT DETECTED Final   Plesimonas shigelloides NOT DETECTED NOT DETECTED Final   Salmonella species NOT DETECTED NOT DETECTED Final   Yersinia enterocolitica NOT DETECTED NOT DETECTED Final   Vibrio species NOT DETECTED NOT DETECTED Final   Vibrio cholerae NOT DETECTED NOT DETECTED Final   Enteroaggregative E coli (EAEC) NOT DETECTED NOT DETECTED Final   Enteropathogenic E coli (EPEC) NOT DETECTED NOT DETECTED  Final   Enterotoxigenic E coli (ETEC) NOT DETECTED NOT DETECTED Final   Shiga like toxin producing E coli (STEC) NOT DETECTED NOT DETECTED Final   Shigella/Enteroinvasive E coli (EIEC) NOT DETECTED NOT DETECTED Final   Cryptosporidium NOT DETECTED NOT DETECTED Final   Cyclospora cayetanensis NOT DETECTED NOT DETECTED Final   Entamoeba histolytica NOT DETECTED NOT DETECTED Final   Giardia lamblia NOT DETECTED NOT DETECTED Final   Adenovirus F40/41 NOT DETECTED NOT DETECTED Final   Astrovirus NOT DETECTED NOT DETECTED Final   Norovirus GI/GII NOT DETECTED NOT DETECTED Final   Rotavirus A NOT DETECTED NOT DETECTED Final   Sapovirus (I, II, IV, and  V) NOT DETECTED NOT DETECTED Final    Comment: Performed at [facility], [address] [place], [place] [address]    Labs: CBC: No results for input(s): WBC, NEUTROABS, HGB, HCT, MCV, PLT in the last 168 hours. Basic Metabolic Panel: Recent Labs  Lab 09/26/23 0318 09/27/23 0508 09/28/23 0400 09/29/23 0509  NA 137 138 136  --   K 3.6 3.3* 3.6  --   CL 108 107 108  --   CO2 23 23 24   --   GLUCOSE 88 89 109*  --   BUN 15 12 10   --   CREATININE 0.99 0.99 1.08* 0.95  CALCIUM  7.4* 7.5* 7.5*  --   MG 2.0 1.9 1.9  --   PHOS 3.8  --   --   --    Liver Function Tests: No results for input(s): AST, ALT, ALKPHOS, BILITOT, PROT, ALBUMIN in the last 168 hours. CBG: Recent Labs  Lab 09/29/23 1635 09/29/23 1805 09/29/23 2119 09/30/23 0755 09/30/23 1214  GLUCAP 64* 111* 137* 83 115*    Discharge time spent: less than 30 minutes.  Signed: Nena Rebel, MD [facility] 10/02/2023

## 2023-10-11 ENCOUNTER — Inpatient Hospital Stay: Admitting: Family Medicine

## 2023-10-11 ENCOUNTER — Telehealth: Payer: Self-pay

## 2023-10-11 NOTE — Telephone Encounter (Signed)
 Called Jessica back no answer per PCP unable to determine since she has not been seen in months.

## 2023-10-11 NOTE — Telephone Encounter (Signed)
 Copied from CRM [id number]. Topic: General - Other >> Oct 11, 2023 11:19 AM Travis F wrote: Reason for CRM: Harlene Iba with [facility] [employer] [facility]  is calling in wanting to know if Dr. Glenard had any concerns about the person who lives in the home with patient, and wanted to know if Dr. Glenard was confident in the individual's ability to make medical decisions for patient. [phone]

## 2023-10-19 ENCOUNTER — Other Ambulatory Visit: Payer: Self-pay | Admitting: Family Medicine

## 2023-10-19 DIAGNOSIS — J302 Other seasonal allergic rhinitis: Secondary | ICD-10-CM

## 2023-10-19 DIAGNOSIS — F331 Major depressive disorder, recurrent, moderate: Secondary | ICD-10-CM

## 2023-10-19 DIAGNOSIS — E1142 Type 2 diabetes mellitus with diabetic polyneuropathy: Secondary | ICD-10-CM

## 2023-10-19 DIAGNOSIS — K219 Gastro-esophageal reflux disease without esophagitis: Secondary | ICD-10-CM

## 2023-10-23 ENCOUNTER — Emergency Department
Admission: EM | Admit: 2023-10-23 | Discharge: 2023-10-24 | Disposition: A | Discharge status: Home / Self Care | Attending: Emergency Medicine | Admitting: Emergency Medicine

## 2023-10-23 ENCOUNTER — Other Ambulatory Visit: Payer: Self-pay

## 2023-10-23 DIAGNOSIS — J449 Chronic obstructive pulmonary disease, unspecified: Secondary | ICD-10-CM | POA: Insufficient documentation

## 2023-10-23 DIAGNOSIS — S81812A Laceration without foreign body, left lower leg, initial encounter: Secondary | ICD-10-CM | POA: Diagnosis not present

## 2023-10-23 DIAGNOSIS — E119 Type 2 diabetes mellitus without complications: Secondary | ICD-10-CM | POA: Insufficient documentation

## 2023-10-23 DIAGNOSIS — G20A1 Parkinson's disease without dyskinesia, without mention of fluctuations: Secondary | ICD-10-CM | POA: Diagnosis not present

## 2023-10-23 DIAGNOSIS — Z23 Encounter for immunization: Secondary | ICD-10-CM | POA: Insufficient documentation

## 2023-10-23 DIAGNOSIS — W050XXA Fall from non-moving wheelchair, initial encounter: Secondary | ICD-10-CM | POA: Diagnosis not present

## 2023-10-23 DIAGNOSIS — S8992XA Unspecified injury of left lower leg, initial encounter: Secondary | ICD-10-CM | POA: Diagnosis present

## 2023-10-23 MED ORDER — LIDOCAINE-EPINEPHRINE-TETRACAINE (LET) TOPICAL GEL
3.0000 mL | Freq: Once | TOPICAL | Status: AC
  Administered 2023-10-23: 3 mL via TOPICAL
  Filled 2023-10-23: qty 3

## 2023-10-23 MED ORDER — TETANUS-DIPHTH-ACELL PERTUSSIS 5-2.5-18.5 LF-MCG/0.5 IM SUSY
0.5000 mL | PREFILLED_SYRINGE | Freq: Once | INTRAMUSCULAR | Status: AC
  Administered 2023-10-23: 0.5 mL via INTRAMUSCULAR
  Filled 2023-10-23: qty 0.5

## 2023-10-23 NOTE — Discharge Instructions (Addendum)
 You have been seen in the Emergency Department (ED) today for a laceration (cut).  We were able to close it with skin glue and/or tape.  Please keep the wound dry for about 24 hours.  At that point you can get it wet, in the shower, for example, but do not submerge it in water.  In 1 to 2 weeks the glue and/or tape will start to come off on its own.  Please do not pull it off early, allow it to fall off on its own.  If there are edges that are starting to pull up, you can trim them with a clean pair of small scissors.  Please take Tylenol  (acetaminophen ) as needed for discomfort as written on the box, unless your doctor has told you not to take this medications.   Please follow up with your doctor as soon as possible regarding today's emergent visit and follow up with the wound care center in 24-48 hours.  Return to the ED or call your doctor if you notice any signs of infection such as fever, increased pain, increased redness, pus, or other symptoms that concern you.

## 2023-10-23 NOTE — ED Provider Notes (Signed)
 [facility] Provider Note    Event Date/Time   First MD Initiated Contact with Patient 10/23/23 2203     (approximate)   History   Laceration   HPI  Tricia [name] is a 63 y.o. female  with a past medical history of frequent falls, type 2 diabetes, GAD, Parkinson's disease, COPD, chronic respiratory failure with hypoxia, chronic prescription opioid use, pressure ulcers presents to the emergency department following a fall with left lower leg injury.  Patient states she was transferring herself from the wheelchair to a chair when she slipped and fell on her butt and the side of the walker scraped her left leg.  Patient wears O2 at night at baseline.  Patient is unsure of last tetanus vaccine.  Patient states she did hit her head, no loss of consciousness/syncope, vomiting, headache, repetitive questioning, amnesia, dizziness.  Denies any other injuries at this time. Does live with son-in-law.    Physical Exam   Triage Vital Signs: ED Triage Vitals  Encounter Vitals Group     BP 10/23/23 2159 111/70     Girls Systolic BP Percentile --      Girls Diastolic BP Percentile --      Boys Systolic BP Percentile --      Boys Diastolic BP Percentile --      Pulse Rate 10/23/23 2159 93     Resp 10/23/23 2159 20     Temp 10/23/23 2159 97.8 F (36.6 C)     Temp Source 10/23/23 2159 Oral     SpO2 10/23/23 2159 100 %     Weight 10/23/23 2200 165 lb (74.8 kg)     Height 10/23/23 2200 5' 8 (1.727 m)     Head Circumference --      Peak Flow --      Pain Score 10/23/23 2200 10     Pain Loc --      Pain Education --      Exclude from Growth Chart --     Most recent vital signs: Vitals:   10/23/23 2159  BP: 111/70  Pulse: 93  Resp: 20  Temp: 97.8 F (36.6 C)  SpO2: 100%    General: Awake, in no acute distress. Appears stated age. Head: Normocephalic, atraumatic. Eyes: PERRLA. EOMs intact. No scleral icterus or conjunctival  injection. Ears/Nose/Throat: No battle signs or raccoon eyes. Neck: Supple, no lymphadenopathy, no nuchal rigidity. CV: Good peripheral perfusion. No edema.  Respiratory:Normal respiratory effort.  No respiratory distress.  GI: Soft, non-distended, non-tender.  Skin:Warm, dry. *** Neurological: A&Ox4 to person, place, time, and situation. Cranial nerves III-XII grossly intact. No focal deficits.  Psychiatric: Mood and affect appropriate. Thought processes coherent.    ED Results / Procedures / Treatments   Labs (all labs ordered are listed, but only abnormal results are displayed) Labs Reviewed - No data to display   EKG     RADIOLOGY    PROCEDURES:  Critical Care performed: No  Procedures ***   MEDICATIONS ORDERED IN ED: Medications  Tdap (BOOSTRIX) injection 0.5 mL (has no administration in time range)  lidocaine -EPINEPHrine -tetracaine  (LET) topical gel (3 mLs Topical Given 10/23/23 2303)     IMPRESSION / MDM / ASSESSMENT AND PLAN / ED COURSE  I reviewed the triage vital signs and the nursing notes.                              Differential diagnosis  includes, but is not limited to, ***  Patient's presentation is most consistent with {EM COPA:27473}  *** I independently viewed the x-ray*** and radiologist's report.  I agree with the radiologist's report ***.       FINAL CLINICAL IMPRESSION(S) / ED DIAGNOSES   Final diagnoses:  None     Rx / DC Orders   ED Discharge Orders     None        Note:  This document was prepared using Dragon voice recognition software and may include unintentional dictation errors.

## 2023-10-23 NOTE — ED Triage Notes (Signed)
 Pt arrives via [facility] from home for a skin tear. Pt was transferring from a walker to a wheelchair. Pt slipped off the chair and as she slid down the walker scraped against her leg. Hx of CHF and diabetes. Pt normally wears O2 at night  EMS vitals: 111/49 BP 102 pulse 99% on RA

## 2023-10-23 NOTE — Telephone Encounter (Signed)
 VM not setup need to schedule appt for refills

## 2023-10-28 ENCOUNTER — Other Ambulatory Visit: Payer: Self-pay | Admitting: Family Medicine

## 2023-10-30 ENCOUNTER — Emergency Department

## 2023-10-30 ENCOUNTER — Other Ambulatory Visit: Payer: Self-pay

## 2023-10-30 ENCOUNTER — Inpatient Hospital Stay
Admission: EM | Admit: 2023-10-30 | Discharge: 2023-11-17 | DRG: 853 | Disposition: A | Discharge status: Skilled Nursing Facility | Attending: Internal Medicine | Admitting: Internal Medicine

## 2023-10-30 DIAGNOSIS — W19XXXA Unspecified fall, initial encounter: Secondary | ICD-10-CM | POA: Diagnosis not present

## 2023-10-30 DIAGNOSIS — E785 Hyperlipidemia, unspecified: Secondary | ICD-10-CM | POA: Diagnosis not present

## 2023-10-30 DIAGNOSIS — K8020 Calculus of gallbladder without cholecystitis without obstruction: Secondary | ICD-10-CM | POA: Diagnosis present

## 2023-10-30 DIAGNOSIS — L97512 Non-pressure chronic ulcer of other part of right foot with fat layer exposed: Secondary | ICD-10-CM | POA: Diagnosis not present

## 2023-10-30 DIAGNOSIS — Z825 Family history of asthma and other chronic lower respiratory diseases: Secondary | ICD-10-CM

## 2023-10-30 DIAGNOSIS — R7881 Bacteremia: Secondary | ICD-10-CM | POA: Diagnosis not present

## 2023-10-30 DIAGNOSIS — S81801A Unspecified open wound, right lower leg, initial encounter: Secondary | ICD-10-CM | POA: Diagnosis not present

## 2023-10-30 DIAGNOSIS — F419 Anxiety disorder, unspecified: Secondary | ICD-10-CM | POA: Diagnosis present

## 2023-10-30 DIAGNOSIS — Z7982 Long term (current) use of aspirin: Secondary | ICD-10-CM

## 2023-10-30 DIAGNOSIS — J440 Chronic obstructive pulmonary disease with acute lower respiratory infection: Secondary | ICD-10-CM | POA: Diagnosis present

## 2023-10-30 DIAGNOSIS — M79604 Pain in right leg: Secondary | ICD-10-CM | POA: Diagnosis not present

## 2023-10-30 DIAGNOSIS — L89156 Pressure-induced deep tissue damage of sacral region: Secondary | ICD-10-CM | POA: Diagnosis present

## 2023-10-30 DIAGNOSIS — R531 Weakness: Secondary | ICD-10-CM

## 2023-10-30 DIAGNOSIS — M869 Osteomyelitis, unspecified: Secondary | ICD-10-CM | POA: Diagnosis present

## 2023-10-30 DIAGNOSIS — N179 Acute kidney failure, unspecified: Secondary | ICD-10-CM | POA: Diagnosis not present

## 2023-10-30 DIAGNOSIS — Z6822 Body mass index (BMI) 22.0-22.9, adult: Secondary | ICD-10-CM

## 2023-10-30 DIAGNOSIS — R000 Tachycardia, unspecified: Secondary | ICD-10-CM | POA: Diagnosis not present

## 2023-10-30 DIAGNOSIS — A419 Sepsis, unspecified organism: Secondary | ICD-10-CM | POA: Diagnosis not present

## 2023-10-30 DIAGNOSIS — K219 Gastro-esophageal reflux disease without esophagitis: Secondary | ICD-10-CM | POA: Diagnosis not present

## 2023-10-30 DIAGNOSIS — G629 Polyneuropathy, unspecified: Secondary | ICD-10-CM | POA: Diagnosis present

## 2023-10-30 DIAGNOSIS — Z808 Family history of malignant neoplasm of other organs or systems: Secondary | ICD-10-CM

## 2023-10-30 DIAGNOSIS — F32A Depression, unspecified: Secondary | ICD-10-CM | POA: Diagnosis present

## 2023-10-30 DIAGNOSIS — I130 Hypertensive heart and chronic kidney disease with heart failure and stage 1 through stage 4 chronic kidney disease, or unspecified chronic kidney disease: Secondary | ICD-10-CM | POA: Diagnosis present

## 2023-10-30 DIAGNOSIS — Z880 Allergy status to penicillin: Secondary | ICD-10-CM

## 2023-10-30 DIAGNOSIS — N200 Calculus of kidney: Secondary | ICD-10-CM | POA: Diagnosis present

## 2023-10-30 DIAGNOSIS — E8721 Acute metabolic acidosis: Secondary | ICD-10-CM | POA: Diagnosis present

## 2023-10-30 DIAGNOSIS — Z9181 History of falling: Secondary | ICD-10-CM

## 2023-10-30 DIAGNOSIS — Z820 Family history of epilepsy and other diseases of the nervous system: Secondary | ICD-10-CM

## 2023-10-30 DIAGNOSIS — Z823 Family history of stroke: Secondary | ICD-10-CM

## 2023-10-30 DIAGNOSIS — R188 Other ascites: Secondary | ICD-10-CM | POA: Diagnosis present

## 2023-10-30 DIAGNOSIS — F17210 Nicotine dependence, cigarettes, uncomplicated: Secondary | ICD-10-CM | POA: Diagnosis present

## 2023-10-30 DIAGNOSIS — G9341 Metabolic encephalopathy: Secondary | ICD-10-CM | POA: Diagnosis present

## 2023-10-30 DIAGNOSIS — L03116 Cellulitis of left lower limb: Secondary | ICD-10-CM | POA: Diagnosis present

## 2023-10-30 DIAGNOSIS — Z9889 Other specified postprocedural states: Secondary | ICD-10-CM

## 2023-10-30 DIAGNOSIS — B9689 Other specified bacterial agents as the cause of diseases classified elsewhere: Secondary | ICD-10-CM | POA: Diagnosis not present

## 2023-10-30 DIAGNOSIS — E875 Hyperkalemia: Secondary | ICD-10-CM | POA: Diagnosis present

## 2023-10-30 DIAGNOSIS — G20A1 Parkinson's disease without dyskinesia, without mention of fluctuations: Secondary | ICD-10-CM | POA: Diagnosis present

## 2023-10-30 DIAGNOSIS — Z1152 Encounter for screening for COVID-19: Secondary | ICD-10-CM | POA: Diagnosis not present

## 2023-10-30 DIAGNOSIS — R6521 Severe sepsis with septic shock: Secondary | ICD-10-CM | POA: Diagnosis present

## 2023-10-30 DIAGNOSIS — J90 Pleural effusion, not elsewhere classified: Secondary | ICD-10-CM

## 2023-10-30 DIAGNOSIS — J441 Chronic obstructive pulmonary disease with (acute) exacerbation: Secondary | ICD-10-CM | POA: Diagnosis present

## 2023-10-30 DIAGNOSIS — L03115 Cellulitis of right lower limb: Secondary | ICD-10-CM | POA: Diagnosis present

## 2023-10-30 DIAGNOSIS — L8990 Pressure ulcer of unspecified site, unspecified stage: Secondary | ICD-10-CM | POA: Diagnosis present

## 2023-10-30 DIAGNOSIS — G894 Chronic pain syndrome: Secondary | ICD-10-CM | POA: Diagnosis present

## 2023-10-30 DIAGNOSIS — M7989 Other specified soft tissue disorders: Secondary | ICD-10-CM | POA: Diagnosis not present

## 2023-10-30 DIAGNOSIS — L97509 Non-pressure chronic ulcer of other part of unspecified foot with unspecified severity: Secondary | ICD-10-CM

## 2023-10-30 DIAGNOSIS — J189 Pneumonia, unspecified organism: Secondary | ICD-10-CM | POA: Diagnosis present

## 2023-10-30 DIAGNOSIS — J4520 Mild intermittent asthma, uncomplicated: Secondary | ICD-10-CM

## 2023-10-30 DIAGNOSIS — I5032 Chronic diastolic (congestive) heart failure: Secondary | ICD-10-CM | POA: Diagnosis present

## 2023-10-30 DIAGNOSIS — L0390 Cellulitis, unspecified: Secondary | ICD-10-CM | POA: Diagnosis not present

## 2023-10-30 DIAGNOSIS — A4150 Gram-negative sepsis, unspecified: Secondary | ICD-10-CM | POA: Diagnosis present

## 2023-10-30 DIAGNOSIS — L089 Local infection of the skin and subcutaneous tissue, unspecified: Secondary | ICD-10-CM

## 2023-10-30 DIAGNOSIS — M79605 Pain in left leg: Secondary | ICD-10-CM | POA: Diagnosis not present

## 2023-10-30 DIAGNOSIS — Z89422 Acquired absence of other left toe(s): Secondary | ICD-10-CM | POA: Diagnosis not present

## 2023-10-30 DIAGNOSIS — E1122 Type 2 diabetes mellitus with diabetic chronic kidney disease: Secondary | ICD-10-CM | POA: Diagnosis present

## 2023-10-30 DIAGNOSIS — E860 Dehydration: Secondary | ICD-10-CM | POA: Diagnosis present

## 2023-10-30 DIAGNOSIS — H9192 Unspecified hearing loss, left ear: Secondary | ICD-10-CM | POA: Diagnosis present

## 2023-10-30 DIAGNOSIS — E11621 Type 2 diabetes mellitus with foot ulcer: Secondary | ICD-10-CM | POA: Diagnosis present

## 2023-10-30 DIAGNOSIS — E1142 Type 2 diabetes mellitus with diabetic polyneuropathy: Secondary | ICD-10-CM | POA: Diagnosis present

## 2023-10-30 DIAGNOSIS — Z7951 Long term (current) use of inhaled steroids: Secondary | ICD-10-CM

## 2023-10-30 DIAGNOSIS — E871 Hypo-osmolality and hyponatremia: Secondary | ICD-10-CM

## 2023-10-30 DIAGNOSIS — M109 Gout, unspecified: Secondary | ICD-10-CM | POA: Diagnosis present

## 2023-10-30 DIAGNOSIS — Z79899 Other long term (current) drug therapy: Secondary | ICD-10-CM

## 2023-10-30 DIAGNOSIS — J9621 Acute and chronic respiratory failure with hypoxia: Secondary | ICD-10-CM | POA: Diagnosis present

## 2023-10-30 DIAGNOSIS — Z8049 Family history of malignant neoplasm of other genital organs: Secondary | ICD-10-CM

## 2023-10-30 DIAGNOSIS — A488 Other specified bacterial diseases: Secondary | ICD-10-CM

## 2023-10-30 DIAGNOSIS — A498 Other bacterial infections of unspecified site: Secondary | ICD-10-CM | POA: Diagnosis not present

## 2023-10-30 DIAGNOSIS — J690 Pneumonitis due to inhalation of food and vomit: Secondary | ICD-10-CM | POA: Diagnosis present

## 2023-10-30 DIAGNOSIS — L02611 Cutaneous abscess of right foot: Secondary | ICD-10-CM

## 2023-10-30 DIAGNOSIS — R710 Precipitous drop in hematocrit: Secondary | ICD-10-CM | POA: Diagnosis not present

## 2023-10-30 DIAGNOSIS — D631 Anemia in chronic kidney disease: Secondary | ICD-10-CM | POA: Diagnosis present

## 2023-10-30 DIAGNOSIS — R262 Difficulty in walking, not elsewhere classified: Secondary | ICD-10-CM | POA: Diagnosis present

## 2023-10-30 DIAGNOSIS — L89153 Pressure ulcer of sacral region, stage 3: Secondary | ICD-10-CM | POA: Diagnosis present

## 2023-10-30 DIAGNOSIS — Z9981 Dependence on supplemental oxygen: Secondary | ICD-10-CM

## 2023-10-30 DIAGNOSIS — J439 Emphysema, unspecified: Secondary | ICD-10-CM | POA: Diagnosis present

## 2023-10-30 DIAGNOSIS — L89321 Pressure ulcer of left buttock, stage 1: Secondary | ICD-10-CM | POA: Diagnosis present

## 2023-10-30 DIAGNOSIS — Z7984 Long term (current) use of oral hypoglycemic drugs: Secondary | ICD-10-CM

## 2023-10-30 DIAGNOSIS — N1330 Unspecified hydronephrosis: Secondary | ICD-10-CM | POA: Diagnosis present

## 2023-10-30 DIAGNOSIS — Z8249 Family history of ischemic heart disease and other diseases of the circulatory system: Secondary | ICD-10-CM

## 2023-10-30 DIAGNOSIS — E43 Unspecified severe protein-calorie malnutrition: Secondary | ICD-10-CM | POA: Diagnosis present

## 2023-10-30 DIAGNOSIS — D696 Thrombocytopenia, unspecified: Secondary | ICD-10-CM | POA: Diagnosis present

## 2023-10-30 DIAGNOSIS — E8809 Other disorders of plasma-protein metabolism, not elsewhere classified: Secondary | ICD-10-CM | POA: Diagnosis present

## 2023-10-30 DIAGNOSIS — Z79891 Long term (current) use of opiate analgesic: Secondary | ICD-10-CM

## 2023-10-30 DIAGNOSIS — S81802A Unspecified open wound, left lower leg, initial encounter: Secondary | ICD-10-CM | POA: Diagnosis not present

## 2023-10-30 DIAGNOSIS — E861 Hypovolemia: Secondary | ICD-10-CM | POA: Diagnosis present

## 2023-10-30 DIAGNOSIS — E1169 Type 2 diabetes mellitus with other specified complication: Secondary | ICD-10-CM | POA: Diagnosis present

## 2023-10-30 DIAGNOSIS — M86172 Other acute osteomyelitis, left ankle and foot: Secondary | ICD-10-CM | POA: Diagnosis not present

## 2023-10-30 DIAGNOSIS — E876 Hypokalemia: Secondary | ICD-10-CM | POA: Diagnosis present

## 2023-10-30 DIAGNOSIS — Z833 Family history of diabetes mellitus: Secondary | ICD-10-CM

## 2023-10-30 DIAGNOSIS — L97511 Non-pressure chronic ulcer of other part of right foot limited to breakdown of skin: Secondary | ICD-10-CM | POA: Diagnosis present

## 2023-10-30 DIAGNOSIS — N1831 Chronic kidney disease, stage 3a: Secondary | ICD-10-CM | POA: Diagnosis present

## 2023-10-30 DIAGNOSIS — J45909 Unspecified asthma, uncomplicated: Secondary | ICD-10-CM

## 2023-10-30 DIAGNOSIS — L97522 Non-pressure chronic ulcer of other part of left foot with fat layer exposed: Secondary | ICD-10-CM | POA: Diagnosis not present

## 2023-10-30 DIAGNOSIS — L89311 Pressure ulcer of right buttock, stage 1: Secondary | ICD-10-CM | POA: Diagnosis present

## 2023-10-30 DIAGNOSIS — Z818 Family history of other mental and behavioral disorders: Secondary | ICD-10-CM

## 2023-10-30 DIAGNOSIS — L97521 Non-pressure chronic ulcer of other part of left foot limited to breakdown of skin: Secondary | ICD-10-CM | POA: Diagnosis present

## 2023-10-30 DIAGNOSIS — Z801 Family history of malignant neoplasm of trachea, bronchus and lung: Secondary | ICD-10-CM

## 2023-10-30 DIAGNOSIS — E11649 Type 2 diabetes mellitus with hypoglycemia without coma: Secondary | ICD-10-CM | POA: Diagnosis present

## 2023-10-30 LAB — CBG MONITORING, ED: Glucose-Capillary: 128 mg/dL — ABNORMAL HIGH (ref 70–99)

## 2023-10-30 LAB — RESP PANEL BY RT-PCR (RSV, FLU A&B, COVID)  RVPGX2
Influenza A by PCR: NEGATIVE
Influenza B by PCR: NEGATIVE
Resp Syncytial Virus by PCR: NEGATIVE
SARS Coronavirus 2 by RT PCR: NEGATIVE

## 2023-10-30 LAB — CBC WITH DIFFERENTIAL/PLATELET
Abs Immature Granulocytes: 0.08 K/uL — ABNORMAL HIGH (ref 0.00–0.07)
Basophils Absolute: 0 K/uL (ref 0.0–0.1)
Basophils Relative: 0 %
Eosinophils Absolute: 0 K/uL (ref 0.0–0.5)
Eosinophils Relative: 0 %
HCT: 36.9 % (ref 36.0–46.0)
Hemoglobin: 11.8 g/dL — ABNORMAL LOW (ref 12.0–15.0)
Immature Granulocytes: 1 %
Lymphocytes Relative: 8 %
Lymphs Abs: 0.8 K/uL (ref 0.7–4.0)
MCH: 31.1 pg (ref 26.0–34.0)
MCHC: 32 g/dL (ref 30.0–36.0)
MCV: 97.4 fL (ref 80.0–100.0)
Monocytes Absolute: 0.3 K/uL (ref 0.1–1.0)
Monocytes Relative: 3 %
Neutro Abs: 8.9 K/uL — ABNORMAL HIGH (ref 1.7–7.7)
Neutrophils Relative %: 88 %
Platelets: 367 K/uL (ref 150–400)
RBC: 3.79 MIL/uL — ABNORMAL LOW (ref 3.87–5.11)
RDW: 14.3 % (ref 11.5–15.5)
Smear Review: NORMAL
WBC: 10.1 K/uL (ref 4.0–10.5)
nRBC: 0 % (ref 0.0–0.2)

## 2023-10-30 LAB — COMPREHENSIVE METABOLIC PANEL WITH GFR
ALT: 9 U/L (ref 0–44)
AST: 25 U/L (ref 15–41)
Albumin: 2 g/dL — ABNORMAL LOW (ref 3.5–5.0)
Alkaline Phosphatase: 123 U/L (ref 38–126)
Anion gap: 13 (ref 5–15)
BUN: 42 mg/dL — ABNORMAL HIGH (ref 8–23)
CO2: 13 mmol/L — ABNORMAL LOW (ref 22–32)
Calcium: 7.8 mg/dL — ABNORMAL LOW (ref 8.9–10.3)
Chloride: 102 mmol/L (ref 98–111)
Creatinine, Ser: 3.64 mg/dL — ABNORMAL HIGH (ref 0.44–1.00)
GFR, Estimated: 13 mL/min — ABNORMAL LOW (ref >60)
Glucose, Bld: 126 mg/dL — ABNORMAL HIGH (ref 70–99)
Potassium: 6 mmol/L — ABNORMAL HIGH (ref 3.5–5.1)
Sodium: 128 mmol/L — ABNORMAL LOW (ref 135–145)
Total Bilirubin: 1.2 mg/dL (ref 0.0–1.2)
Total Protein: 5.7 g/dL — ABNORMAL LOW (ref 6.5–8.1)

## 2023-10-30 LAB — PROTIME-INR
INR: 1.3 — ABNORMAL HIGH (ref 0.8–1.2)
Prothrombin Time: 17.4 s — ABNORMAL HIGH (ref 11.4–15.2)

## 2023-10-30 LAB — BRAIN NATRIURETIC PEPTIDE: B Natriuretic Peptide: 78.5 pg/mL (ref 0.0–100.0)

## 2023-10-30 LAB — TROPONIN I (HIGH SENSITIVITY)
Troponin I (High Sensitivity): 14 ng/L (ref <18)
Troponin I (High Sensitivity): 10 ng/L (ref <18)

## 2023-10-30 LAB — LACTIC ACID, PLASMA
Lactic Acid, Venous: 2.6 mmol/L (ref 0.5–1.9)
Lactic Acid, Venous: 2.5 mmol/L (ref 0.5–1.9)

## 2023-10-30 LAB — CK: Total CK: 38 U/L (ref 38–234)

## 2023-10-30 MED ORDER — DULOXETINE HCL 60 MG PO CPEP
60.0000 mg | ORAL_CAPSULE | Freq: Every day | ORAL | Status: DC
Start: 2023-10-31 — End: 2023-10-31

## 2023-10-30 MED ORDER — QUETIAPINE FUMARATE 25 MG PO TABS
25.0000 mg | ORAL_TABLET | Freq: Every day | ORAL | Status: DC
  Administered 2023-10-30: 25 mg via ORAL
  Filled 2023-10-30: qty 1

## 2023-10-30 MED ORDER — CALCIUM CARBONATE ANTACID 500 MG PO CHEW: 400.0000 mg | CHEWABLE_TABLET | Freq: Three times a day (TID) | ORAL | Status: DC | PRN

## 2023-10-30 MED ORDER — TIOTROPIUM BROMIDE MONOHYDRATE 18 MCG IN CAPS: 1.0000 | ORAL_CAPSULE | Freq: Every day | RESPIRATORY_TRACT | Status: DC

## 2023-10-30 MED ORDER — ACETAMINOPHEN 650 MG RE SUPP
650.0000 mg | Freq: Four times a day (QID) | RECTAL | Status: DC | PRN
  Filled 2023-10-30: qty 2

## 2023-10-30 MED ORDER — THEOPHYLLINE ER 400 MG PO TB24
400.0000 mg | ORAL_TABLET | Freq: Every day | ORAL | Status: DC
Start: 2023-10-31 — End: 2023-10-31
  Filled 2023-10-30: qty 1

## 2023-10-30 MED ORDER — VANCOMYCIN HCL IN DEXTROSE 1-5 GM/200ML-% IV SOLN
1000.0000 mg | Freq: Once | INTRAVENOUS | Status: AC
  Administered 2023-10-30: 1000 mg via INTRAVENOUS
  Filled 2023-10-30: qty 200

## 2023-10-30 MED ORDER — PANTOPRAZOLE SODIUM 40 MG PO TBEC: 40.0000 mg | DELAYED_RELEASE_TABLET | Freq: Every day | ORAL | Status: DC

## 2023-10-30 MED ORDER — CYCLOBENZAPRINE HCL 10 MG PO TABS
10.0000 mg | ORAL_TABLET | Freq: Every day | ORAL | Status: DC
  Administered 2023-10-30: 10 mg via ORAL
  Filled 2023-10-30 (×2): qty 1

## 2023-10-30 MED ORDER — MIDODRINE HCL 5 MG PO TABS
5.0000 mg | ORAL_TABLET | Freq: Three times a day (TID) | ORAL | Status: DC
Start: 2023-10-31 — End: 2023-10-31

## 2023-10-30 MED ORDER — EMPAGLIFLOZIN 25 MG PO TABS: 25.0000 mg | ORAL_TABLET | Freq: Every day | ORAL | Status: DC

## 2023-10-30 MED ORDER — SODIUM CHLORIDE 0.9 % IV SOLN
2.0000 g | Freq: Once | INTRAVENOUS | Status: DC
  Filled 2023-10-30: qty 20

## 2023-10-30 MED ORDER — COLCHICINE 0.6 MG PO TABS
0.6000 mg | ORAL_TABLET | Freq: Two times a day (BID) | ORAL | Status: DC | PRN
Start: 2023-10-30 — End: 2023-10-31

## 2023-10-30 MED ORDER — CETIRIZINE HCL 10 MG PO TABS
10.0000 mg | ORAL_TABLET | Freq: Every evening | ORAL | Status: DC
Start: 2023-10-30 — End: 2023-10-31
  Administered 2023-10-30: 10 mg via ORAL
  Filled 2023-10-30 (×2): qty 1

## 2023-10-30 MED ORDER — ADULT MULTIVITAMIN W/MINERALS CH: 1.0000 | ORAL_TABLET | Freq: Every day | ORAL | Status: DC

## 2023-10-30 MED ORDER — TRAZODONE HCL 50 MG PO TABS: 25.0000 mg | ORAL_TABLET | Freq: Every evening | ORAL | Status: DC | PRN

## 2023-10-30 MED ORDER — FLUTICASONE FUROATE-VILANTEROL 200-25 MCG/ACT IN AEPB
1.0000 | INHALATION_SPRAY | Freq: Every day | RESPIRATORY_TRACT | Status: DC
Start: 2023-10-31 — End: 2023-11-01
  Administered 2023-11-01: 1 via RESPIRATORY_TRACT
  Filled 2023-10-30: qty 28

## 2023-10-30 MED ORDER — IPRATROPIUM-ALBUTEROL 0.5-2.5 (3) MG/3ML IN SOLN
3.0000 mL | Freq: Four times a day (QID) | RESPIRATORY_TRACT | Status: DC | PRN
  Administered 2023-11-03 – 2023-11-10 (×5): 3 mL via RESPIRATORY_TRACT
  Filled 2023-10-30 (×6): qty 3

## 2023-10-30 MED ORDER — SODIUM CHLORIDE 0.9 % IV SOLN
2.0000 g | INTRAVENOUS | Status: DC
  Administered 2023-10-31: 2 g via INTRAVENOUS
  Filled 2023-10-30 (×2): qty 12.5

## 2023-10-30 MED ORDER — CALCIUM GLUCONATE-NACL 1-0.675 GM/50ML-% IV SOLN
1.0000 g | Freq: Once | INTRAVENOUS | Status: AC
  Administered 2023-10-30: 1000 mg via INTRAVENOUS
  Filled 2023-10-30: qty 50

## 2023-10-30 MED ORDER — ROSUVASTATIN CALCIUM 5 MG PO TABS
5.0000 mg | ORAL_TABLET | Freq: Every day | ORAL | Status: DC
Start: 2023-10-30 — End: 2023-10-31
  Administered 2023-10-30: 5 mg via ORAL
  Filled 2023-10-30: qty 1

## 2023-10-30 MED ORDER — INSULIN ASPART 100 UNIT/ML IJ SOLN
0.0000 [IU] | Freq: Three times a day (TID) | INTRAMUSCULAR | Status: DC
  Administered 2023-10-30: 2 [IU] via SUBCUTANEOUS
  Filled 2023-10-30: qty 2

## 2023-10-30 MED ORDER — ENOXAPARIN SODIUM 30 MG/0.3ML IJ SOSY
30.0000 mg | PREFILLED_SYRINGE | INTRAMUSCULAR | Status: DC
  Administered 2023-10-30 – 2023-10-31 (×2): 30 mg via SUBCUTANEOUS
  Filled 2023-10-30 (×2): qty 0.3

## 2023-10-30 MED ORDER — METRONIDAZOLE 500 MG/100ML IV SOLN
500.0000 mg | Freq: Once | INTRAVENOUS | Status: AC
  Administered 2023-10-30: 500 mg via INTRAVENOUS
  Filled 2023-10-30: qty 100

## 2023-10-30 MED ORDER — SODIUM ZIRCONIUM CYCLOSILICATE 10 G PO PACK
10.0000 g | PACK | Freq: Once | ORAL | Status: AC
  Administered 2023-10-30: 10 g via ORAL
  Filled 2023-10-30: qty 1

## 2023-10-30 MED ORDER — ONDANSETRON HCL 4 MG PO TABS: 4.0000 mg | ORAL_TABLET | Freq: Four times a day (QID) | ORAL | Status: DC | PRN

## 2023-10-30 MED ORDER — SODIUM CHLORIDE 0.9 % IV SOLN
2.0000 g | Freq: Once | INTRAVENOUS | Status: AC
  Administered 2023-10-30: 2 g via INTRAVENOUS
  Filled 2023-10-30: qty 12.5

## 2023-10-30 MED ORDER — SODIUM CHLORIDE 0.9 % IV BOLUS (SEPSIS)
1000.0000 mL | Freq: Once | INTRAVENOUS | Status: AC
  Administered 2023-10-30: 1000 mL via INTRAVENOUS

## 2023-10-30 MED ORDER — DIPHENOXYLATE-ATROPINE 2.5-0.025 MG PO TABS: 2.0000 | ORAL_TABLET | Freq: Four times a day (QID) | ORAL | Status: DC | PRN

## 2023-10-30 MED ORDER — INSULIN ASPART 100 UNIT/ML IV SOLN
5.0000 [IU] | Freq: Once | INTRAVENOUS | Status: AC
  Administered 2023-10-30: 5 [IU] via INTRAVENOUS
  Filled 2023-10-30: qty 0.05

## 2023-10-30 MED ORDER — MAGNESIUM HYDROXIDE 400 MG/5ML PO SUSP: 30.0000 mL | Freq: Every day | ORAL | Status: DC | PRN

## 2023-10-30 MED ORDER — VANCOMYCIN VARIABLE DOSE PER UNSTABLE RENAL FUNCTION (PHARMACIST DOSING): Status: DC

## 2023-10-30 MED ORDER — ONDANSETRON HCL 4 MG/2ML IJ SOLN
4.0000 mg | Freq: Four times a day (QID) | INTRAMUSCULAR | Status: DC | PRN
  Administered 2023-11-03 – 2023-11-05 (×3): 4 mg via INTRAVENOUS
  Filled 2023-10-30 (×3): qty 2

## 2023-10-30 MED ORDER — VITAMIN C 500 MG PO TABS
500.0000 mg | ORAL_TABLET | Freq: Two times a day (BID) | ORAL | Status: DC
Start: 2023-10-30 — End: 2023-10-31
  Administered 2023-10-30: 500 mg via ORAL
  Filled 2023-10-30: qty 1

## 2023-10-30 MED ORDER — MONTELUKAST SODIUM 10 MG PO TABS
10.0000 mg | ORAL_TABLET | Freq: Every day | ORAL | Status: DC
  Administered 2023-10-30: 10 mg via ORAL
  Filled 2023-10-30: qty 1

## 2023-10-30 MED ORDER — INSULIN ASPART 100 UNIT/ML IJ SOLN: 0.0000 [IU] | Freq: Every day | INTRAMUSCULAR | Status: DC

## 2023-10-30 MED ORDER — VANCOMYCIN HCL 750 MG/150ML IV SOLN
750.0000 mg | Freq: Once | INTRAVENOUS | Status: AC
  Administered 2023-10-30: 750 mg via INTRAVENOUS
  Filled 2023-10-30: qty 150

## 2023-10-30 MED ORDER — LACTATED RINGERS IV SOLN
150.0000 mL/h | INTRAVENOUS | Status: DC
  Administered 2023-10-30 – 2023-10-31 (×2): 150 mL/h via INTRAVENOUS

## 2023-10-30 MED ORDER — CARBIDOPA-LEVODOPA 25-100 MG PO TABS
1.5000 | ORAL_TABLET | Freq: Three times a day (TID) | ORAL | Status: DC
  Administered 2023-10-30: 1.5 via ORAL
  Filled 2023-10-30 (×4): qty 1.5

## 2023-10-30 MED ORDER — DEXTROSE 50 % IV SOLN
1.0000 | Freq: Once | INTRAVENOUS | Status: AC
  Administered 2023-10-30: 50 mL via INTRAVENOUS
  Filled 2023-10-30: qty 50

## 2023-10-30 MED ORDER — ACETAMINOPHEN 325 MG PO TABS: 650.0000 mg | ORAL_TABLET | Freq: Four times a day (QID) | ORAL | Status: DC | PRN

## 2023-10-30 MED ORDER — ASPIRIN 81 MG PO TBEC
81.0000 mg | DELAYED_RELEASE_TABLET | Freq: Every day | ORAL | Status: DC
Start: 2023-10-31 — End: 2023-10-31

## 2023-10-30 MED ORDER — ZINC SULFATE 220 (50 ZN) MG PO CAPS
220.0000 mg | ORAL_CAPSULE | Freq: Every day | ORAL | Status: DC
Start: 2023-10-31 — End: 2023-10-31

## 2023-10-30 MED ORDER — CARBIDOPA-LEVODOPA ER 50-200 MG PO TBCR
1.0000 | EXTENDED_RELEASE_TABLET | Freq: Every day | ORAL | Status: DC
  Administered 2023-10-30: 1 via ORAL
  Filled 2023-10-30 (×3): qty 1

## 2023-10-30 MED ORDER — SODIUM CHLORIDE 0.9 % IV SOLN: 2.0000 g | Freq: Once | INTRAVENOUS | Status: DC

## 2023-10-30 MED ORDER — VANCOMYCIN HCL IN DEXTROSE 1-5 GM/200ML-% IV SOLN: 1000.0000 mg | Freq: Once | INTRAVENOUS | Status: DC

## 2023-10-30 MED ORDER — OXYCODONE HCL 5 MG PO TABS
5.0000 mg | ORAL_TABLET | ORAL | Status: DC | PRN
  Administered 2023-10-30: 5 mg via ORAL
  Filled 2023-10-30: qty 1

## 2023-10-30 NOTE — ED Triage Notes (Signed)
 Pt to ED via EMS from home, pt lives at home with son, pt reports bilateral leg swelling over past few days and increased weakness. Pt having a hard time staying awake in triage, pt has a large amount of fluid weeping from lower legs. Pts pants saturated and pt and c/o pain to lower legs.

## 2023-10-30 NOTE — Assessment & Plan Note (Addendum)
 Creatinine 3.64 on presentation and currently 1.19.  Likely underlying ckd stage 3a.

## 2023-10-30 NOTE — Consult Note (Signed)
 PHARMACY - BRIEF ANTIBIOTIC NOTE   Pharmacy has received consult(s) for cefepime  from an ED provider. [name] profile has been reviewed for ht/wt/allergies/indication/available labs.    One time order(s) placed for: cefepime  2g IV  Further antibiotics/pharmacy consults should be ordered by admitting physician if indicated.                       Thank you,  Will M. Lenon, PharmD, BCPS Clinical Pharmacist 10/30/2023 8:23 PM

## 2023-10-30 NOTE — Progress Notes (Signed)
 Elink monitoring for the code sepsis protocol.

## 2023-10-30 NOTE — Assessment & Plan Note (Addendum)
 Last sodium normal range

## 2023-10-30 NOTE — Assessment & Plan Note (Addendum)
 Will continue statin therapy

## 2023-10-30 NOTE — Assessment & Plan Note (Addendum)
 Will continue theophylline  (will lower dose while on cipro ), and inhalers

## 2023-10-30 NOTE — Progress Notes (Signed)
 Anticoagulation monitoring(Lovenox ):  63 yo female ordered Lovenox  40 mg Q24h    Filed Weights   10/30/23 1925  Weight: 70.3 kg (155 lb)   BMI 23.6    Lab Results  Component Value Date   CREATININE 3.64 (H) 10/30/2023   CREATININE 0.95 09/29/2023   CREATININE 1.08 (H) 09/28/2023   Estimated Creatinine Clearance: 16 mL/min (A) (by C-G formula based on SCr of 3.64 mg/dL (H)). Hemoglobin & Hematocrit     Component Value Date/Time   HGB 11.8 (L) 10/30/2023 1928   HGB 13.7 11/16/2013 2040   HCT 36.9 10/30/2023 1928   HCT 40.9 11/16/2013 2040     Per Protocol for Patient with estCrcl < 30 ml/min and BMI < 40, will transition to Lovenox  30 mg Q24h.

## 2023-10-30 NOTE — Assessment & Plan Note (Addendum)
 Continue PPI therapy.

## 2023-10-30 NOTE — Assessment & Plan Note (Addendum)
 Resolved after acute kidney injury improved.

## 2023-10-30 NOTE — Progress Notes (Signed)
 Pharmacy Antibiotic Note  Tricia [name] is a 63 y.o. female admitted on 10/30/2023 with sepsis and cellulitis.  Pharmacy has been consulted for Vancomycin   dosing.  Pt is AKI, will dose by levels until renal function is stable.    Plan: Cefepime  2 gm IV X 1 given in ED on 9/10 @ 2117. Cefepime  2 gm IV Q24H ordered to start on 9/11 @ 2100.  Vancomycin  1 gm IV X 1 given in ED on 9/10 @ 2003.  Additional Vanc 750 mg IV X 1 ordered to make total loading dose of 1750 mg. - Pt is AKI so will dose by levels until renal function improves - will draw Vanc random on 9/11 @ 2000 - Goal trough: 15 - 20 mcg/mL   Height: 5' 8 (172.7 cm) Weight: 70.3 kg (155 lb) IBW/kg (Calculated) : 63.9  Temp (24hrs), Avg:99.6 F (37.6 C), Min:99.3 F (37.4 C), Max:99.8 F (37.7 C)  Recent Labs  Lab 10/30/23 1928  WBC 10.1  CREATININE 3.64*  LATICACIDVEN 2.6*    Estimated Creatinine Clearance: 16 mL/min (A) (by C-G formula based on SCr of 3.64 mg/dL (H)).    Allergies  Allergen Reactions   Augmentin [Amoxicillin-Pot Clavulanate] Diarrhea   Penicillins Itching    Antimicrobials this admission:   >>    >>   Dose adjustments this admission:   Microbiology results:  BCx:   UCx:    Sputum:    MRSA PCR:   Thank you for allowing pharmacy to be a part of this patient's care.  Danielle Lento D 10/30/2023 10:21 PM

## 2023-10-30 NOTE — Assessment & Plan Note (Addendum)
Continue colchicine as needed 

## 2023-10-30 NOTE — ED Triage Notes (Signed)
 Pt arrives via EMS from home for c/o weakness and swelling to LE(s); hx CHF

## 2023-10-30 NOTE — Assessment & Plan Note (Addendum)
 Present on admission.  Cellulitis and abscess foot.  Blood cultures on 9/10 grew out Serratia.  Wound culture on 9/23 and 9/24 growing Serratia also.  MRI does not show osteomyelitis or an abscess.  Podiatry brought to the operating room 9/25 for incision and drainage of abscess right foot.  Patient on Rocephin .  ID consultation recommended continuing Rocephin  while here and switching over to Cipro  for total of 14 days.  Heel contact (partial weightbearing) with right foot and postop shoe.

## 2023-10-30 NOTE — ED Provider Notes (Signed)
 [facility] Provider Note    Event Date/Time   First MD Initiated Contact with Patient 10/30/23 1942     (approximate)   History   Weakness and Leg Swelling   HPI  KHRISTEN CHEYNEY is a 63 y.o. female with history of diabetes, CHF, GERD, hyperlipidemia, presenting with leg swelling and generalized weakness.  Has noted bilateral lower extremity swelling over the past couple days with increased weakness.  No chest pain or shortness of breath.  Had been on the ground for several hours given that she was too weak to get up and walk, was found covered in urine and feces.  This is a son who is able to come in intermittently to see her.  Has noted a wound to her left leg with purulent drainage.  Denies any focal weakness or numbness.  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       Physical Exam   Triage Vital Signs: ED Triage Vitals  Encounter Vitals Group     BP 10/30/23 1926 (!) 82/49     Girls Systolic BP Percentile --      Girls Diastolic BP Percentile --      Boys Systolic BP Percentile --      Boys Diastolic BP Percentile --      Pulse Rate 10/30/23 1926 (!) 114     Resp 10/30/23 1926 20     Temp 10/30/23 1926 99.3 F (37.4 C)     Temp Source 10/30/23 1926 Axillary     SpO2 10/30/23 1926 97 %     Weight 10/30/23 1925 155 lb (70.3 kg)     Height 10/30/23 1925 5' 8 (1.727 m)     Head Circumference --      Peak Flow --      Pain Score 10/30/23 1925 10     Pain Loc --      Pain Education --      Exclude from Growth Chart --     Most recent vital signs: Vitals:   10/30/23 1945 10/30/23 1950  BP:  (!) 109/53  Pulse:  82  Resp:  19  Temp: 99.8 F (37.7 C)   SpO2:       General: Awake, no distress.  CV:  Good peripheral  perfusion.  Resp:  Normal effort.  No tachypnea or respiratory distress Abd:  No distention.  Soft nontender Other:  She has bilateral lower extremity edema, there is venous stasis changes to her bilateral legs, she does have a wound with purulent drainage to the left lateral leg with surrounding erythema.  She has palpable weak DP pulses bilaterally, extremities are cool, no focal numbness, able to wiggle her toes bilaterally.  She has a sacral ulcer that does not appear infected.   ED Results / Procedures / Treatments   Labs (all labs ordered are listed, but only abnormal results are displayed) Labs Reviewed  CBC WITH DIFFERENTIAL/PLATELET - Abnormal; Notable for the following components:      Result Value   RBC 3.79 (*)    Hemoglobin 11.8 (*)    Neutro Abs 8.9 (*)    Abs Immature Granulocytes 0.08 (*)    All other components within normal limits  COMPREHENSIVE METABOLIC PANEL WITH GFR - Abnormal; Notable for  the following components:   Sodium 128 (*)    Potassium 6.0 (*)    CO2 13 (*)    Glucose, Bld 126 (*)    BUN 42 (*)    Creatinine, Ser 3.64 (*)    Calcium  7.8 (*)    Total Protein 5.7 (*)    Albumin  2.0 (*)    GFR, Estimated 13 (*)    All other components within normal limits  LACTIC ACID, PLASMA - Abnormal; Notable for the following components:   Lactic Acid, Venous 2.6 (*)    All other components within normal limits  PROTIME-INR - Abnormal; Notable for the following components:   Prothrombin Time 17.4 (*)    INR 1.3 (*)    All other components within normal limits  CULTURE, BLOOD (ROUTINE X 2)  CULTURE, BLOOD (ROUTINE X 2)  RESP PANEL BY RT-PCR (RSV, FLU A&B, COVID)  [id number]  LACTIC ACID, PLASMA  URINALYSIS, ROUTINE W REFLEX MICROSCOPIC  BRAIN NATRIURETIC PEPTIDE  CK  TROPONIN I (HIGH SENSITIVITY)  TROPONIN I (HIGH SENSITIVITY)     EKG  EKG shows, sinus tachycardia, rate 118, normal QS, normal QTc, no obvious ischemic ST elevation, T wave flattening in aVL,  not significantly compared to prior   RADIOLOGY On my independent interpretation, chest x-ray without obvious consolidation   PROCEDURES:  Critical Care performed: Yes, see critical care procedure note(s)  .Critical Care  Performed by: Waymond Lorelle Cummins, MD Authorized by: Waymond Lorelle Cummins, MD   Critical care provider statement:    Critical care time (minutes):  40   Critical care was necessary to treat or prevent imminent or life-threatening deterioration of the following conditions:  Sepsis and metabolic crisis   Critical care was time spent personally by me on the following activities:  Development of treatment plan with patient or surrogate, discussions with consultants, evaluation of patient's response to treatment, examination of patient, ordering and review of laboratory studies, ordering and review of radiographic studies, ordering and performing treatments and interventions, pulse oximetry, re-evaluation of patient's condition and review of old charts    MEDICATIONS ORDERED IN ED: Medications  metroNIDAZOLE  (FLAGYL ) IVPB 500 mg (has no administration in time range)  ceFEPIme  (MAXIPIME ) 2 g in sodium chloride  0.9 % 100 mL IVPB (2 g Intravenous New Bag/Given 10/30/23 2117)  insulin  aspart (novoLOG ) injection 5 Units (has no administration in time range)    And  dextrose  50 % solution 50 mL (has no administration in time range)  sodium zirconium cyclosilicate  (LOKELMA ) packet 10 g (has no administration in time range)  calcium  gluconate 1 g/ 50 mL sodium chloride  IVPB (has no administration in time range)  sodium chloride  0.9 % bolus 1,000 mL (1,000 mLs Intravenous New Bag/Given 10/30/23 1957)  vancomycin  (VANCOCIN ) IVPB 1000 mg/200 mL premix (1,000 mg Intravenous New Bag/Given 10/30/23 2003)     IMPRESSION / MDM / ASSESSMENT AND PLAN / ED COURSE  I reviewed the triage vital signs and the nursing notes.                              Differential diagnosis includes, but is not limited  to, sepsis, cellulitis, infected pressure ulcer, did consider osteomyelitis but wound does not go to bone at this time, electrolyte derangements, dehydration, arrhythmia, atypical ACS.  Get labs, lactic acid, blood cultures, IV fluids, will cover with broad-spectrum IV antibiotics.  Initially hypotensive and tachycardic, repeat blood pressures systolic 100s.  She  will need to be admitted for further management.  Patient's presentation is most consistent with acute presentation with potential threat to life or bodily function.  Independent interpretation of labs and imaging below.  Given her sepsis secondary to wound infection, AKI and electrolyte derangements, she will need to be admitted for further management.  Consulted hospitalist was agreeable with the plan for admission and will evaluate the patient.  She is admitted.  The patient is on the cardiac monitor to evaluate for evidence of arrhythmia and/or significant heart rate changes.   Clinical Course as of 10/30/23 2120  Wed Oct 30, 2023  2056 Independent review of labs, lactate is elevated, no leukocytosis, she is hyponatremic, hyperkalemic, AKI noted, acidotic without anion gap.  Troponin not elevated. [TT]  2057 DG Chest Portable 1 View IMPRESSION: 1. Emphysema and background scarring unchanged since prior study. No acute airspace disease.   [TT]  2058 For the hyperkalemia, she is getting fluids, give her some insulin  dextrose  as well as calcium  gluconate.  No new EKG changes noted.  For the hyponatremia, suspect findings are secondary to dehydration for which she is getting fluids. [TT]    Clinical Course User Index [TT] Waymond Lorelle Cummins, MD     FINAL CLINICAL IMPRESSION(S) / ED DIAGNOSES   Final diagnoses:  Weakness  Leg swelling  Wound infection  Sepsis, due to unspecified organism, unspecified whether acute organ dysfunction present (HCC)  Hyponatremia  Hyperkalemia  AKI (acute kidney injury) (HCC)     Rx / DC Orders    ED Discharge Orders     None        Note:  This document was prepared using Dragon voice recognition software and may include unintentional dictation errors.    Waymond Lorelle Cummins, MD 10/30/23 2120

## 2023-10-30 NOTE — Consult Note (Signed)
 CODE SEPSIS - PHARMACY COMMUNICATION  **Broad-spectrum antimicrobials should be administered within one hour of sepsis diagnosis**  Time Code Sepsis call or page was received: 1953  Antibiotics ordered: Cefepime , Vancomycin , Metronidazole   Time of first antibiotic administration: 2003  Additional action taken by pharmacy: n/a  If necessary, name of provider/nurse contacted: n/a    Will M. Lenon, PharmD, BCPS Clinical Pharmacist 10/30/2023 8:21 PM

## 2023-10-30 NOTE — Assessment & Plan Note (Addendum)
-   Will continue Sinemet  CR and Sinemet  IR.

## 2023-10-30 NOTE — H&P (Addendum)
 [place]   PATIENT NAME: Arta Stump    MR#:  [id number]  DATE OF BIRTH:  Jul 20, 1960  DATE OF ADMISSION:  10/30/2023  PRIMARY CARE PHYSICIAN: Sowles, Krichna, MD   Patient is coming from: Home  REQUESTING/REFERRING PHYSICIAN: Waymond Lorelle Cummins, MD    CHIEF COMPLAINT:   Chief Complaint  Patient presents with   Weakness   Leg Swelling    HISTORY OF PRESENT ILLNESS:  DESHAE DICKISON is a 63 y.o. Caucasian female with medical history significant for anxiety, osteoarthritis, asthma, COPD, depression, type 2 diabetes mellitus, GERD, dyslipidemia, and hypertension, who presented to the emergency room with acute onset of generalized weakness and bilateral lower extremity swelling more on the left.  The patient has been on the ground apparently for several hours before her son found her.  She denies presyncope or syncope but just felt too weak to get up and walk and was unfortunately covered in urine and feces.  She was noted to have a left leg wound with purulent drainage in the right leg ulcer with surrounding erythema and induration.  She denied any chest pain or palpitations.  She has been having dyspnea without cough or wheezing.  She admitted to fever and chills.  She denied any nausea or vomiting or abdominal pain.  No dysuria, oliguria or hematuria or flank pain.  ED Course: When the patient came to the ER, BP was 109/53 with heart rate of 114 and later BP was 96/52 and temperature was 99.8, respiratory rate was 22. Labs reveal hyponatremia 128 and hyperkalemia of 6 with a CO2 of 13 and glucose of 126, BUN of 42 and creatinine 3.64 calcium  of 7.8 and albumin  2 with total protein 5.7.  BNP was 78.5 and high-sensitivity troponin I was 14.  Lactic acid was 2.6 and CBC showed hemoglobin 11.8 and hematocrit 36.9.  Respiratory panel came back negative.  Blood cultures were drawn. EKG as reviewed by me : EKG showed sinus tachycardia with rate 118 with poor R wave  progression. Imaging: Portable chest x-ray showed emphysema with no acute cardiopulmonary disease.  The patient was given IV vancomycin  and  IV cefepime .  She will be admitted to a progressive unit bed for further evaluation and management. PAST MEDICAL HISTORY:   Past Medical History:  Diagnosis Date   Anxiety    Arthritis    joints and hands/ knees   Asthma    uses inhaler   Benign essential tremor    head   Cervical dystonia    neck pain   Cholesteatoma of left ear    x2   COPD (chronic obstructive pulmonary disease) (HCC)    Cough    Depression    Diabetes mellitus without complication (HCC)    type 2   Diastolic dysfunction    Dyspnea    Dysrhythmia    diastolic dysfunction   GERD (gastroesophageal reflux disease)    Headache    migraines/ one per week   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   Hyperlipidemia    Hypertension    Motion sickness    boat   Neuromuscular disorder (HCC)    neuropathy feet and hands( nerve damage)   Wears dentures    upper and lower    PAST SURGICAL HISTORY:   Past Surgical History:  Procedure Laterality Date   CARPAL TUNNEL RELEASE Bilateral    x2 right, 1x on left   COLONOSCOPY  COLONOSCOPY WITH PROPOFOL  N/A 04/25/2017   Procedure: COLONOSCOPY WITH PROPOFOL ;  Surgeon: Jinny Carmine, MD;  Location: [facility];  Service: Endoscopy;  Laterality: N/A;  diabetic-oral med   DILATION AND CURETTAGE OF UTERUS     ESOPHAGOGASTRODUODENOSCOPY (EGD) WITH PROPOFOL  N/A 01/10/2021   Procedure: ESOPHAGOGASTRODUODENOSCOPY (EGD) WITH PROPOFOL ;  Surgeon: Janalyn Keene NOVAK, MD;  Location: [facility];  Service: Endoscopy;  Laterality: N/A;  Diabetic   EXTERNAL EAR SURGERY Left    x2   POLYPECTOMY  04/25/2017   Procedure: POLYPECTOMY INTESTINAL;  Surgeon: Jinny Carmine, MD;  Location: [facility];  Service: Endoscopy;;   SPINE SURGERY     herniated disc   TUBAL LIGATION      SOCIAL HISTORY:   Social  History   Tobacco Use   Smoking status: Every Day    Current packs/day: 1.00    Average packs/day: 1 pack/day for 46.4 years (46.4 ttl pk-yrs)    Types: Cigarettes    Start date: 05/19/1977   Smokeless tobacco: Never  Substance Use Topics   Alcohol use: No    Alcohol/week: 0.0 standard drinks of alcohol    FAMILY HISTORY:   Family History  Problem Relation Age of Onset   Emphysema Mother    Anxiety disorder Mother    Stroke Father    Throat cancer Father    Lung cancer Maternal Grandmother    Lung cancer Maternal Grandfather    Hypertension Daughter    Diabetes Daughter    Multiple sclerosis Daughter    Bipolar disorder Daughter    Cervical cancer Daughter    Bipolar disorder Daughter    Drug abuse Daughter    Lung cancer Maternal Aunt    Lung cancer Maternal Uncle     DRUG ALLERGIES:   Allergies  Allergen Reactions   Augmentin [Amoxicillin-Pot Clavulanate] Diarrhea   Penicillins Itching    REVIEW OF SYSTEMS:   ROS As per history of present illness. All pertinent systems were reviewed above. Constitutional, HEENT, cardiovascular, respiratory, GI, GU, musculoskeletal, neuro, psychiatric, endocrine, integumentary and hematologic systems were reviewed and are otherwise negative/unremarkable except for positive findings mentioned above in the HPI.   MEDICATIONS AT HOME:   Prior to Admission medications   Medication Sig Start Date End Date Taking? Authorizing Provider  ascorbic acid  (VITAMIN C ) 500 MG tablet Take 1 tablet (500 mg total) by mouth 2 (two) times daily. 09/27/23   Alexander, Natalie, DO  aspirin  EC 81 MG tablet Take 81 mg by mouth daily. Swallow whole.    [provider]  calcium  carbonate (TUMS - DOSED IN MG ELEMENTAL CALCIUM ) 500 MG chewable tablet Chew 2 tablets (400 mg of elemental calcium  total) by mouth 3 (three) times daily as needed for indigestion or heartburn. 09/30/23   Alexander, Natalie, DO  carbidopa -levodopa  (SINEMET  CR) 50-200 MG  tablet Take 1 tablet by mouth at bedtime. 07/26/23 07/25/24  [provider]  carbidopa -levodopa  (SINEMET  IR) 25-100 MG tablet Take 1.5 tablets by mouth 3 (three) times daily. 01/25/20 09/23/23  Shah, Hemang K, MD  colchicine  0.6 MG tablet Take 1 tablet (0.6 mg total) by mouth 2 (two) times daily as needed for up to 3 days (gout flare). 09/27/23 09/30/23  Alexander, Natalie, DO  cyclobenzaprine  (FLEXERIL ) 10 MG tablet Take 10 mg by mouth at bedtime. 08/01/21   [provider]  diphenoxylate -atropine  (LOMOTIL ) 2.5-0.025 MG tablet Take 2 tablets by mouth 4 (four) times daily as needed for diarrhea or loose stools. 09/27/23  Alexander, Natalie, DO  DULoxetine  (CYMBALTA ) 60 MG capsule Take 1 capsule (60 mg total) by mouth daily. 05/01/23   Sowles, Krichna, MD  empagliflozin  (JARDIANCE ) 25 MG TABS tablet Take 1 tablet (25 mg total) by mouth daily before breakfast. 05/01/23   Sowles, Krichna, MD  furosemide  (LASIX ) 40 MG tablet Take 1 tablet (40 mg total) by mouth daily as needed. 06/21/17   [name], Almarie BRAVO, NP  ipratropium-albuterol  (DUONEB) 0.5-2.5 (3) MG/3ML SOLN Inhale 3 mLs into the lungs every 6 (six) hours as needed. 06/21/17   [name], Drake E, NP  leptospermum manuka honey (MEDIHONEY) PSTE paste Apply 1 Application topically daily. Apply to sacral wound Apply thin layer (3 mm) to wound. 09/28/23   Alexander, Natalie, DO  levocetirizine (XYZAL ) 5 MG tablet Take 1 tablet (5 mg total) by mouth every evening. 05/01/23   Sowles, Krichna, MD  midodrine  (PROAMATINE ) 5 MG tablet Take 1 tablet (5 mg total) by mouth 3 (three) times daily with meals. 09/27/23   Alexander, Natalie, DO  montelukast  (SINGULAIR ) 10 MG tablet Take 1 tablet by mouth at bedtime. 02/21/21   Fleming, Herbon E, MD  Multiple Vitamin (MULTIVITAMIN WITH MINERALS) TABS tablet Take 1 tablet by mouth daily. 09/28/23   Alexander, Natalie, DO  omeprazole  (PRILOSEC) 40 MG capsule Take 1 capsule (40 mg total) by mouth daily. 05/01/23   Sowles,  Krichna, MD  oxyCODONE  (OXY IR/ROXICODONE ) 5 MG immediate release tablet Take 1 tablet (5 mg total) by mouth every 4 (four) hours as needed for moderate pain (pain score 4-6) or severe pain (pain score 7-10). 09/27/23   Alexander, Natalie, DO  QUEtiapine  (SEROQUEL ) 25 MG tablet Take 1 tablet (25 mg total) by mouth at bedtime. 05/01/23   Sowles, Krichna, MD  rosuvastatin  (CRESTOR ) 5 MG tablet Take 1 tablet (5 mg total) by mouth at bedtime. 05/01/23   Sowles, Krichna, MD  SYMBICORT  160-4.5 MCG/ACT inhaler INHALE 2 PUFFS TWICE A DAY RINSE MOUTH WITH WATER  AFTER EACH USE 07/09/22   Sowles, Krichna, MD  theophylline  (UNIPHYL) 400 MG 24 hr tablet Take 1 tablet by mouth daily.  12/27/17   Fleming, Herbon E, MD  tiotropium (SPIRIVA ) 18 MCG inhalation capsule Place 1 capsule into inhaler and inhale daily. pm 08/27/13   Theotis Lavelle BRAVO, MD  VENTOLIN  HFA 108 [phone] Base) MCG/ACT inhaler INHALE 1 PUFF BY MOUTH AS NEEDED 04/26/20   Sowles, Krichna, MD  zinc  sulfate, 50mg  elemental zinc , 220 (50 Zn) MG capsule Take 1 capsule (220 mg total) by mouth daily. 09/28/23   Alexander, Natalie, DO      VITAL SIGNS:  Blood pressure (!) 109/53, pulse 82, temperature 99.8 F (37.7 C), temperature source Rectal, resp. rate 19, height 5' 8 (1.727 m), weight 70.3 kg, SpO2 97%.  PHYSICAL EXAMINATION:  Physical Exam  GENERAL:  63 y.o.-year-old Caucasian female patient lying in the bed with no acute distress.  EYES: Pupils equal, round, reactive to light and accommodation. No scleral icterus. Extraocular muscles intact.  HEENT: Head atraumatic, normocephalic. Oropharynx and nasopharynx clear.  NECK:  Supple, no jugular venous distention. No thyroid  enlargement, no tenderness.  LUNGS: Normal breath sounds bilaterally, no wheezing, rales,rhonchi or crepitation. No use of accessory muscles of respiration.  CARDIOVASCULAR: Regular rate and rhythm, S1, S2 normal. No murmurs, rubs, or gallops.  ABDOMEN: Soft, nondistended, nontender. Bowel  sounds present. No organomegaly or mass.  EXTREMITIES: Bilateral 2+ pitting lower extremity edema with no cyanosis, or clubbing.  NEUROLOGIC: Cranial nerves II through XII are intact.  Muscle strength 5/5 in all extremities. Sensation intact. Gait not checked.  PSYCHIATRIC: The patient is alert and oriented x 3.  Normal affect and good eye contact. SKIN: Left lateral leg wound with purulent drainage with surrounding erythema and induration and right leg wound with whitish eschar.  She has a sacral ulcer that does not appear infected. LABORATORY PANEL:   CBC Recent Labs  Lab 10/30/23 1928  WBC 10.1  HGB 11.8*  HCT 36.9  PLT 367   ------------------------------------------------------------------------------------------------------------------  Chemistries  Recent Labs  Lab 10/30/23 1928  NA 128*  K 6.0*  CL 102  CO2 13*  GLUCOSE 126*  BUN 42*  CREATININE 3.64*  CALCIUM  7.8*  AST 25  ALT 9  ALKPHOS 123  BILITOT 1.2   ------------------------------------------------------------------------------------------------------------------  Cardiac Enzymes No results for input(s): TROPONINI in the last 168 hours. ------------------------------------------------------------------------------------------------------------------  RADIOLOGY:  DG Chest Portable 1 View Result Date: 10/30/2023 CLINICAL DATA:  Short of breath, lower extremity swelling, weakness EXAM: PORTABLE CHEST 1 VIEW COMPARISON:  09/22/2023 FINDINGS: Single frontal view of the chest demonstrates an unremarkable cardiac silhouette. Stable emphysema and background scarring. No acute airspace disease, effusion, or pneumothorax. No acute bony abnormalities. IMPRESSION: 1. Emphysema and background scarring unchanged since prior study. No acute airspace disease. Electronically Signed   By: Ozell Daring M.D.   On: 10/30/2023 20:36      IMPRESSION AND PLAN:  Assessment and Plan: * Sepsis due to cellulitis [facility]) - The  patient will be admitted to a progressive unit bed. - Continue antibiotic therapy with IV cefepime  and vancomycin . - The sepsis is manifested by tachycardia and tachypnea. - Will follow blood cultures and wound Gram stain culture and sensitivity. - Wound care consult to be obtained.  AKI (acute kidney injury) (HCC) - This likely due to volume depletion and dehydration. - The patient will be hydrated with IV lactated ringer . - Will follow BMP. - Will avoid for toxins.  Hyperkalemia - This was aggressively managed and will be followed. - It is likely secondary to AKI.  Hyponatremia - It is likely hypovolemic. - The patient will be hydrated as mentioned above and will follow BMP.  Asthma, chronic Will continue theophylline , Singulair , and his bronchodilator inhaler.  GERD without esophagitis - Will continue PPI therapy.  Parkinson's disease (HCC) - Will continue Sinemet  CR and Sinemet  IR.  Dyslipidemia - Will continue statin therapy.  Gout - Will continue colchicine .   DVT prophylaxis: Lovenox .  Advanced Care Planning:  Code Status: full code.  Family Communication:  The plan of care was discussed in details with the patient (and family). I answered all questions. The patient agreed to proceed with the above mentioned plan. Further management will depend upon hospital course. Disposition Plan: Back to previous home environment Consults called: none.  All the records are reviewed and case discussed with ED provider.  Status is: Inpatient  At the time of the admission, it appears that the appropriate admission status for this patient is inpatient.  This is judged to be reasonable and necessary in order to provide the required intensity of service to ensure the patient's safety given the presenting symptoms, physical exam findings and initial radiographic and laboratory data in the context of comorbid conditions.  The patient requires inpatient status due to high intensity of  service, high risk of further deterioration and high frequency of surveillance required.  I certify that at the time of admission, it is my clinical judgment that the patient will require inpatient hospital  care extending more than 2 midnights.                            Dispo: The patient is from: Home              Anticipated d/c is to: Home              Patient currently is not medically stable to d/c.              Difficult to place patient: No  Madison DELENA Peaches M.D on 10/30/2023 at 11:09 PM  [facility]   From 7 PM-7 AM, contact night-coverage [email or web address]  CC: Primary care physician; Sowles, Krichna, MD

## 2023-10-31 ENCOUNTER — Inpatient Hospital Stay

## 2023-10-31 DIAGNOSIS — A498 Other bacterial infections of unspecified site: Secondary | ICD-10-CM | POA: Diagnosis not present

## 2023-10-31 DIAGNOSIS — R6521 Severe sepsis with septic shock: Secondary | ICD-10-CM

## 2023-10-31 DIAGNOSIS — A419 Sepsis, unspecified organism: Secondary | ICD-10-CM | POA: Diagnosis present

## 2023-10-31 DIAGNOSIS — L0390 Cellulitis, unspecified: Secondary | ICD-10-CM | POA: Diagnosis not present

## 2023-10-31 LAB — BLOOD CULTURE ID PANEL (REFLEXED) - BCID2

## 2023-10-31 LAB — GLUCOSE, CAPILLARY
Glucose-Capillary: 62 mg/dL — ABNORMAL LOW (ref 70–99)
Glucose-Capillary: 77 mg/dL (ref 70–99)
Glucose-Capillary: 188 mg/dL — ABNORMAL HIGH (ref 70–99)
Glucose-Capillary: 185 mg/dL — ABNORMAL HIGH (ref 70–99)

## 2023-10-31 LAB — BASIC METABOLIC PANEL WITH GFR
Anion gap: 8 (ref 5–15)
Anion gap: 11 (ref 5–15)
Anion gap: 10 (ref 5–15)
BUN: 40 mg/dL — ABNORMAL HIGH (ref 8–23)
BUN: 47 mg/dL — ABNORMAL HIGH (ref 8–23)
BUN: 47 mg/dL — ABNORMAL HIGH (ref 8–23)
CO2: 18 mmol/L — ABNORMAL LOW (ref 22–32)
CO2: 16 mmol/L — ABNORMAL LOW (ref 22–32)
CO2: 17 mmol/L — ABNORMAL LOW (ref 22–32)
Calcium: 7.2 mg/dL — ABNORMAL LOW (ref 8.9–10.3)
Calcium: 7 mg/dL — ABNORMAL LOW (ref 8.9–10.3)
Calcium: 7 mg/dL — ABNORMAL LOW (ref 8.9–10.3)
Chloride: 105 mmol/L (ref 98–111)
Chloride: 102 mmol/L (ref 98–111)
Chloride: 102 mmol/L (ref 98–111)
Creatinine, Ser: 3.29 mg/dL — ABNORMAL HIGH (ref 0.44–1.00)
Creatinine, Ser: 3.47 mg/dL — ABNORMAL HIGH (ref 0.44–1.00)
Creatinine, Ser: 3.28 mg/dL — ABNORMAL HIGH (ref 0.44–1.00)
GFR, Estimated: 15 mL/min — ABNORMAL LOW (ref >60)
GFR, Estimated: 14 mL/min — ABNORMAL LOW (ref >60)
GFR, Estimated: 15 mL/min — ABNORMAL LOW (ref >60)
Glucose, Bld: 62 mg/dL — ABNORMAL LOW (ref 70–99)
Glucose, Bld: 182 mg/dL — ABNORMAL HIGH (ref 70–99)
Glucose, Bld: 206 mg/dL — ABNORMAL HIGH (ref 70–99)
Potassium: 5.2 mmol/L — ABNORMAL HIGH (ref 3.5–5.1)
Potassium: 5 mmol/L (ref 3.5–5.1)
Potassium: 4.7 mmol/L (ref 3.5–5.1)
Sodium: 131 mmol/L — ABNORMAL LOW (ref 135–145)
Sodium: 129 mmol/L — ABNORMAL LOW (ref 135–145)
Sodium: 129 mmol/L — ABNORMAL LOW (ref 135–145)

## 2023-10-31 LAB — CBC
HCT: 28.9 % — ABNORMAL LOW (ref 36.0–46.0)
HCT: 29.7 % — ABNORMAL LOW (ref 36.0–46.0)
Hemoglobin: 9.6 g/dL — ABNORMAL LOW (ref 12.0–15.0)
Hemoglobin: 9.8 g/dL — ABNORMAL LOW (ref 12.0–15.0)
MCH: 32 pg (ref 26.0–34.0)
MCH: 31.8 pg (ref 26.0–34.0)
MCHC: 33.2 g/dL (ref 30.0–36.0)
MCHC: 33 g/dL (ref 30.0–36.0)
MCV: 96.3 fL (ref 80.0–100.0)
MCV: 96.4 fL (ref 80.0–100.0)
Platelets: 173 K/uL (ref 150–400)
Platelets: 184 K/uL (ref 150–400)
RBC: 3 MIL/uL — ABNORMAL LOW (ref 3.87–5.11)
RBC: 3.08 MIL/uL — ABNORMAL LOW (ref 3.87–5.11)
RDW: 14.5 % (ref 11.5–15.5)
RDW: 14.6 % (ref 11.5–15.5)
WBC: 7.7 K/uL (ref 4.0–10.5)
WBC: 17.1 K/uL — ABNORMAL HIGH (ref 4.0–10.5)
nRBC: 0 % (ref 0.0–0.2)
nRBC: 0 % (ref 0.0–0.2)

## 2023-10-31 LAB — CBG MONITORING, ED
Glucose-Capillary: 58 mg/dL — ABNORMAL LOW (ref 70–99)
Glucose-Capillary: 94 mg/dL (ref 70–99)
Glucose-Capillary: 54 mg/dL — ABNORMAL LOW (ref 70–99)
Glucose-Capillary: 66 mg/dL — ABNORMAL LOW (ref 70–99)
Glucose-Capillary: 60 mg/dL — ABNORMAL LOW (ref 70–99)
Glucose-Capillary: 82 mg/dL (ref 70–99)
Glucose-Capillary: 96 mg/dL (ref 70–99)

## 2023-10-31 LAB — PROTIME-INR
INR: 1.7 — ABNORMAL HIGH (ref 0.8–1.2)
Prothrombin Time: 21.2 s — ABNORMAL HIGH (ref 11.4–15.2)

## 2023-10-31 LAB — MRSA NEXT GEN BY PCR, NASAL: MRSA by PCR Next Gen: NOT DETECTED

## 2023-10-31 LAB — HEMOGLOBIN A1C
Hgb A1c MFr Bld: 5.1 % (ref 4.8–5.6)
Mean Plasma Glucose: 99.67 mg/dL

## 2023-10-31 LAB — LACTIC ACID, PLASMA
Lactic Acid, Venous: 2.6 mmol/L (ref 0.5–1.9)
Lactic Acid, Venous: 1.8 mmol/L (ref 0.5–1.9)

## 2023-10-31 LAB — CORTISOL-AM, BLOOD: Cortisol - AM: 39.6 ug/dL — ABNORMAL HIGH (ref 6.7–22.6)

## 2023-10-31 MED ORDER — IPRATROPIUM-ALBUTEROL 0.5-2.5 (3) MG/3ML IN SOLN
3.0000 mL | RESPIRATORY_TRACT | Status: DC
  Administered 2023-10-31 – 2023-11-01 (×8): 3 mL via RESPIRATORY_TRACT
  Filled 2023-10-31 (×8): qty 3

## 2023-10-31 MED ORDER — DEXTROSE 50 % IV SOLN
12.5000 g | INTRAVENOUS | Status: AC
  Administered 2023-10-31: 12.5 g via INTRAVENOUS

## 2023-10-31 MED ORDER — CHLORHEXIDINE GLUCONATE CLOTH 2 % EX PADS
6.0000 | MEDICATED_PAD | Freq: Every day | CUTANEOUS | Status: DC
  Administered 2023-10-31 – 2023-11-17 (×17): 6 via TOPICAL

## 2023-10-31 MED ORDER — METHYLPREDNISOLONE SODIUM SUCC 40 MG IJ SOLR
40.0000 mg | Freq: Every day | INTRAMUSCULAR | Status: DC
  Administered 2023-10-31 – 2023-11-01 (×2): 40 mg via INTRAVENOUS
  Filled 2023-10-31 (×2): qty 1

## 2023-10-31 MED ORDER — SODIUM CHLORIDE 0.9 % IV BOLUS
500.0000 mL | Freq: Once | INTRAVENOUS | Status: AC
  Administered 2023-10-31: 500 mL via INTRAVENOUS

## 2023-10-31 MED ORDER — DEXTROSE 50 % IV SOLN
12.5000 g | INTRAVENOUS | Status: AC
  Administered 2023-10-31: 12.5 g via INTRAVENOUS
  Filled 2023-10-31: qty 50

## 2023-10-31 MED ORDER — NOREPINEPHRINE 16 MG/250ML-% IV SOLN
0.0000 ug/min | INTRAVENOUS | Status: DC
  Administered 2023-10-31: 5 ug/min via INTRAVENOUS
  Filled 2023-10-31: qty 250

## 2023-10-31 MED ORDER — MIDODRINE HCL 5 MG PO TABS
10.0000 mg | ORAL_TABLET | Freq: Once | ORAL | Status: AC
  Administered 2023-10-31: 10 mg via ORAL
  Filled 2023-10-31: qty 2

## 2023-10-31 MED ORDER — UMECLIDINIUM BROMIDE 62.5 MCG/ACT IN AEPB
1.0000 | INHALATION_SPRAY | Freq: Every day | RESPIRATORY_TRACT | Status: DC
  Administered 2023-11-01: 1 via RESPIRATORY_TRACT
  Filled 2023-10-31: qty 7

## 2023-10-31 MED ORDER — SODIUM BICARBONATE 8.4 % IV SOLN
INTRAVENOUS | Status: DC
  Filled 2023-10-31 (×3): qty 1000
  Filled 2023-10-31: qty 150
  Filled 2023-10-31 (×2): qty 1000
  Filled 2023-10-31: qty 150
  Filled 2023-10-31: qty 1000
  Filled 2023-10-31 (×3): qty 150

## 2023-10-31 MED ORDER — DEXTROSE-SODIUM CHLORIDE 5-0.9 % IV SOLN
INTRAVENOUS | Status: DC
Start: 2023-10-31 — End: 2023-10-31

## 2023-10-31 MED ORDER — PANTOPRAZOLE SODIUM 40 MG IV SOLR
40.0000 mg | INTRAVENOUS | Status: DC
  Administered 2023-10-31 – 2023-11-04 (×5): 40 mg via INTRAVENOUS
  Filled 2023-10-31 (×5): qty 10

## 2023-10-31 MED ORDER — INSULIN ASPART 100 UNIT/ML IV SOLN
5.0000 [IU] | Freq: Once | INTRAVENOUS | Status: DC
  Filled 2023-10-31: qty 0.05

## 2023-10-31 MED ORDER — DEXTROSE 50 % IV SOLN
1.0000 | INTRAVENOUS | Status: DC | PRN
  Administered 2023-11-02: 50 mL via INTRAVENOUS
  Filled 2023-10-31: qty 50

## 2023-10-31 MED ORDER — MIDODRINE HCL 5 MG PO TABS
5.0000 mg | ORAL_TABLET | Freq: Three times a day (TID) | ORAL | Status: DC
Start: 2023-10-31 — End: 2023-10-31

## 2023-10-31 MED ORDER — LIDOCAINE 5 % EX PTCH
1.0000 | MEDICATED_PATCH | CUTANEOUS | Status: DC
  Administered 2023-10-31 – 2023-11-09 (×10): 1 via TRANSDERMAL
  Filled 2023-10-31 (×11): qty 1

## 2023-10-31 MED ORDER — MIDODRINE HCL 5 MG PO TABS
10.0000 mg | ORAL_TABLET | Freq: Three times a day (TID) | ORAL | Status: DC
Start: 2023-10-31 — End: 2023-10-31

## 2023-10-31 MED ORDER — DEXTROSE 50 % IV SOLN
INTRAVENOUS | Status: AC
  Filled 2023-10-31: qty 50

## 2023-10-31 MED ORDER — ACETAMINOPHEN 650 MG RE SUPP: 650.0000 mg | RECTAL | Status: DC | PRN

## 2023-10-31 MED ORDER — INSULIN ASPART 100 UNIT/ML IJ SOLN
0.0000 [IU] | INTRAMUSCULAR | Status: DC
  Administered 2023-10-31 (×2): 2 [IU] via SUBCUTANEOUS
  Filled 2023-10-31 (×2): qty 1

## 2023-10-31 MED ORDER — DEXTROSE 50 % IV SOLN
INTRAVENOUS | Status: AC
  Administered 2023-10-31: 50 mL
  Filled 2023-10-31: qty 50

## 2023-10-31 NOTE — ED Notes (Signed)
 MD made aware of holding midodrine  due to pt not able to take PO at this time.

## 2023-10-31 NOTE — Progress Notes (Signed)
 PHARMACY - PHYSICIAN COMMUNICATION CRITICAL VALUE ALERT - BLOOD CULTURE IDENTIFICATION (BCID)  Tricia [name] is an 63 y.o. female who presented to [facility] on 10/30/2023 with a chief complaint of fall and too weak to get back up.    Assessment:  blood culture from 9/10 with GNR in 4/4 bottles, BCID detects Serratia.  Skin is likely source - LLE wound  Name of physician (or Provider) Contacted: Dr Lenon  Current antibiotics: Vancomycin  and Cefepime   Changes to prescribed antibiotics recommended:  Recommendations accepted by provider - stopped vancomycin    Results for orders placed or performed during the hospital encounter of 10/30/23  Blood Culture ID Panel (Reflexed) (Collected: 10/30/2023  7:28 PM)  Result Value Ref Range   Enterococcus faecalis NOT DETECTED NOT DETECTED   Enterococcus Faecium NOT DETECTED NOT DETECTED   Listeria monocytogenes NOT DETECTED NOT DETECTED   Staphylococcus species NOT DETECTED NOT DETECTED   Staphylococcus aureus (BCID) NOT DETECTED NOT DETECTED   Staphylococcus epidermidis NOT DETECTED NOT DETECTED   Staphylococcus lugdunensis NOT DETECTED NOT DETECTED   Streptococcus species NOT DETECTED NOT DETECTED   Streptococcus agalactiae NOT DETECTED NOT DETECTED   Streptococcus pneumoniae NOT DETECTED NOT DETECTED   Streptococcus pyogenes NOT DETECTED NOT DETECTED   A.calcoaceticus-baumannii NOT DETECTED NOT DETECTED   Bacteroides fragilis NOT DETECTED NOT DETECTED   Enterobacterales DETECTED (A) NOT DETECTED   Enterobacter cloacae complex NOT DETECTED NOT DETECTED   Escherichia coli NOT DETECTED NOT DETECTED   Klebsiella aerogenes NOT DETECTED NOT DETECTED   Klebsiella oxytoca NOT DETECTED NOT DETECTED   Klebsiella pneumoniae NOT DETECTED NOT DETECTED   Proteus species NOT DETECTED NOT DETECTED   Salmonella species NOT DETECTED NOT DETECTED   Serratia marcescens DETECTED (A) NOT DETECTED   Haemophilus influenzae NOT DETECTED NOT DETECTED    Neisseria meningitidis NOT DETECTED NOT DETECTED   Pseudomonas aeruginosa NOT DETECTED NOT DETECTED   Stenotrophomonas maltophilia NOT DETECTED NOT DETECTED   Candida albicans NOT DETECTED NOT DETECTED   Candida auris NOT DETECTED NOT DETECTED   Candida glabrata NOT DETECTED NOT DETECTED   Candida krusei NOT DETECTED NOT DETECTED   Candida parapsilosis NOT DETECTED NOT DETECTED   Candida tropicalis NOT DETECTED NOT DETECTED   Cryptococcus neoformans/gattii NOT DETECTED NOT DETECTED   CTX-M ESBL NOT DETECTED NOT DETECTED   Carbapenem resistance IMP NOT DETECTED NOT DETECTED   Carbapenem resistance KPC NOT DETECTED NOT DETECTED   Carbapenem resistance NDM NOT DETECTED NOT DETECTED   Carbapenem resist OXA 48 LIKE NOT DETECTED NOT DETECTED   Carbapenem resistance VIM NOT DETECTED NOT DETECTED    Celestine Slovak, PharmD, BCPS, BCIDP Work Cell: [phone] 10/31/2023 8:55 AM

## 2023-10-31 NOTE — Evaluation (Signed)
 Clinical/Bedside Swallow Evaluation Patient Details  Name: Tricia [name] MRN: [id number] Date of Birth: Jun 30, 1960  Today's Date: 10/31/2023 Time: SLP Start Time (ACUTE ONLY): 0830 SLP Stop Time (ACUTE ONLY): 0840 SLP Time Calculation (min) (ACUTE ONLY): 10 min  Past Medical History:  Past Medical History:  Diagnosis Date   Anxiety    Arthritis    joints and hands/ knees   Asthma    uses inhaler   Benign essential tremor    head   Cervical dystonia    neck pain   Cholesteatoma of left ear    x2   COPD (chronic obstructive pulmonary disease) (HCC)    Cough    Depression    Diabetes mellitus without complication (HCC)    type 2   Diastolic dysfunction    Dyspnea    Dysrhythmia    diastolic dysfunction   GERD (gastroesophageal reflux disease)    Headache    migraines/ one per week   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   Hyperlipidemia    Hypertension    Motion sickness    boat   Neuromuscular disorder (HCC)    neuropathy feet and hands( nerve damage)   Wears dentures    upper and lower   Past Surgical History:  Past Surgical History:  Procedure Laterality Date   CARPAL TUNNEL RELEASE Bilateral    x2 right, 1x on left   COLONOSCOPY     COLONOSCOPY WITH PROPOFOL  N/A 04/25/2017   Procedure: COLONOSCOPY WITH PROPOFOL ;  Surgeon: Jinny Carmine, MD;  Location: [facility];  Service: Endoscopy;  Laterality: N/A;  diabetic-oral med   DILATION AND CURETTAGE OF UTERUS     ESOPHAGOGASTRODUODENOSCOPY (EGD) WITH PROPOFOL  N/A 01/10/2021   Procedure: ESOPHAGOGASTRODUODENOSCOPY (EGD) WITH PROPOFOL ;  Surgeon: Janalyn Keene NOVAK, MD;  Location: [facility];  Service: Endoscopy;  Laterality: N/A;  Diabetic   EXTERNAL EAR SURGERY Left    x2   POLYPECTOMY  04/25/2017   Procedure: POLYPECTOMY INTESTINAL;  Surgeon: Jinny Carmine, MD;  Location: [facility];  Service: Endoscopy;;   SPINE SURGERY     herniated disc   TUBAL LIGATION     HPI:   Tricia [name] is a 63 y.o. female who presented to ED with acute onset of generalizated weakness and BLE swelling (L>R) after being found on the ground for several hours covered in urine and feces. Pt with a PMH significant for anxiety, osteoarthritis, asthma, COPD, depression, type 2 diabetes mellitus, GERD, dyslipidemia, and hypertension. CXR 1. Emphysema and background scarring unchanged since prior study. No  acute airspace disease.    Assessment / Plan / Recommendation  Clinical Impression  Pt seen for clinical swallowing evaluation. A full oral motor exam could not be completed, but observed xerostomia, edentulism, and ?R facial droop (vs natural asymmetry). Pt presents with s/sx severe oral dysphagia c/b inability to retrieve puree from tsp, intermittent difficulty siphoning liquids from straw, and anterior loss of liquid on R. Concern for pharyngeal dysphagia given delayed, congested cough appreciated after single sips of thins via cup and straw. Oral deficits likely due to poor bolus awareness in setting of AMS and waxing/waning LOA. At present, a safe oral diet cannot be recommended. Recommend NPO with oral care by nursing with plan to re-assess next date pending improvements in both AMS and LOA. SLP Visit Diagnosis: Dysphagia, oropharyngeal phase (R13.12)    Aspiration Risk  Severe aspiration risk;Risk for inadequate nutrition/hydration    Diet Recommendation  NPO         Other  Recommendations Oral Care Recommendations: Oral care QID;Staff/trained caregiver to provide oral care        Functional Status Assessment Patient has not had a recent decline in their functional status  Frequency and Duration    2 weeks       Prognosis Prognosis for improved oropharyngeal function: Fair Barriers to Reach Goals: Cognitive deficits;Severity of deficits      Swallow Study   General Date of Onset: 10/30/23 HPI: Tricia [name] is a 63 y.o. female y.o. female who presented to ED  with acute onset of generalizated weakness and BLE swelling (L>R) after being found on the ground for several hours covered in urine and feces. Pt with a PMH significant for anxiety, osteoarthritis, asthma, COPD, depression, type 2 diabetes mellitus, GERD, dyslipidemia, and hypertension. CXR 1. Emphysema and background scarring unchanged since prior study. No  acute airspace disease. Type of Study: Bedside Swallow Evaluation Previous Swallow Assessment: none Diet Prior to this Study:  (diet ordered, but RN holding tray) Temperature Spikes Noted: Yes (99.5, WBC 7.7) Respiratory Status: Room air History of Recent Intubation: No Behavior/Cognition: Confused;Lethargic/Drowsy (inconsistently following commands) Oral Cavity Assessment: Dry Oral Care Completed by SLP: Yes Oral Cavity - Dentition: Edentulous Vision: Functional for self-feeding Self-Feeding Abilities: Total assist Patient Positioning: Upright in bed Baseline Vocal Quality: Normal Volitional Cough: Cognitively unable to elicit Volitional Swallow: Unable to elicit    Oral/Motor/Sensory Function Overall Oral Motor/Sensory Function:  (UTA)   Ice Chips Ice chips: Not tested   Thin Liquid Thin Liquid: Impaired Presentation: Cup;Straw Oral Phase Impairments: Reduced labial seal;Poor awareness of bolus Oral Phase Functional Implications: Right anterior spillage (inconsistent ability to siphon from straw) Pharyngeal  Phase Impairments: Cough - Delayed    Nectar Thick Nectar Thick Liquid: Not tested   Honey Thick Honey Thick Liquid: Not tested   Puree Puree: Impaired Presentation: Self Fed Oral Phase Impairments: Reduced labial seal;Poor awareness of bolus Oral Phase Functional Implications:  (inability to strip from spoon) Pharyngeal Phase Impairments:  (UTA)   Solid     Solid: Not tested     Delon Bangs, M.S., CCC-SLP Speech-Language Pathologist [facility] [phone]  [place])   Delon HERO Zenon Leaf 10/31/2023,8:54 AM

## 2023-10-31 NOTE — Consult Note (Signed)
 NAME:  Tricia [name], MRN:  [id number], DOB:  Jul 12, 1960, LOS: 1 ADMISSION DATE:  10/30/2023, CONSULTATION DATE:  10/31/2023 REFERRING MD:  Dr. Lenon, CHIEF COMPLAINT:  Hypotension    Brief Pt Description / Synopsis:  63 y.o. female with PMHx most significant for COPD on home oxygen  admitted with Severe Sepsis due to Serratia Bacteremia secondary to severe Cellulitis of bilateral lower extremities, along with AKI and Anion Gap Metabolic Acidosis.  Course complicated by development of Septic Shock requiring vasopressors and Acute COPD Exacerbation.  History of Present Illness:  Tricia [name] is a 63 year old female with a past medical history significant for COPD requiring home supplemental oxygen , asthma, hypertension, type 2 diabetes mellitus, dyslipidemia, GERD who presented to [facility] ED on 10/30/2023 due to acute onset of generalized weakness and bilateral lower extremity swelling (left greater than right).  She reported she had been on the ground for several hours before her son found her.  She denied syncopal episode or hitting her head, but was just too weak to get up off the ground.  She was found covered in urine and feces.  She was also noted to have left leg wound with purulent drainage and right leg ulcer with surrounding erythema and induration.  She also endorsed shortness of breath and fever/chills.   She denies chest pain, palpitations, cough, sputum production, wheezing, abdominal pain, dysuria, nausea, vomiting.   ED Course: Initial Vital Signs: BP was 109/53 with heart rate of 114 and later BP was 96/52 and temperature was 99.8, respiratory rate was 22  Significant Labs: hyponatremia 128 and hyperkalemia of 6 with a CO2 of 13 and glucose of 126, BUN of 42 and creatinine 3.64 calcium  of 7.8 and albumin  2 with total protein 5.7.  BNP was 78.5 and high-sensitivity troponin I was 14.  Lactic acid was 2.6 and CBC showed hemoglobin 11.8 and hematocrit 36.9.  Respiratory panel  came back negative.  Blood cultures were drawn.  Imaging Chest X-ray>>IMPRESSION: 1. Emphysema and background scarring unchanged since prior study. No acute airspace disease. Medications Administered: IV vancomycin  and IV cefepime    TRH asked to admit for further workup and treatment.  Please see Significant Hospital Events section below for full detailed hospital course.   Pertinent  Medical History   Past Medical History:  Diagnosis Date   Anxiety    Arthritis    joints and hands/ knees   Asthma    uses inhaler   Benign essential tremor    head   Cervical dystonia    neck pain   Cholesteatoma of left ear    x2   COPD (chronic obstructive pulmonary disease) (HCC)    Cough    Depression    Diabetes mellitus without complication (HCC)    type 2   Diastolic dysfunction    Dyspnea    Dysrhythmia    diastolic dysfunction   GERD (gastroesophageal reflux disease)    Headache    migraines/ one per week   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   Hyperlipidemia    Hypertension    Motion sickness    boat   Neuromuscular disorder (HCC)    neuropathy feet and hands( nerve damage)   Wears dentures    upper and lower    Micro Data:  9/10: COVID/FLU/RSV PCR>> negative  9/10: Blood cultures (4/4 bottles)>> Serratia Marcescens   Antimicrobials:   Anti-infectives (From admission, onward)    Start     Dose/Rate Route  Frequency Ordered Stop   10/31/23 2100  ceFEPIme  (MAXIPIME ) 2 g in sodium chloride  0.9 % 100 mL IVPB        2 g 200 mL/hr over 30 Minutes Intravenous Every 24 hours 10/30/23 2140     10/30/23 2218  vancomycin  variable dose per unstable renal function (pharmacist dosing)  Status:  Discontinued         Does not apply See admin instructions 10/30/23 2218 10/31/23 0909   10/30/23 2145  vancomycin  (VANCOREADY) IVPB 750 mg/150 mL        750 mg 150 mL/hr over 60 Minutes Intravenous  Once 10/30/23 2136 10/31/23 0101   10/30/23 2130  vancomycin   (VANCOCIN ) IVPB 1000 mg/200 mL premix  Status:  Discontinued        1,000 mg 200 mL/hr over 60 Minutes Intravenous  Once 10/30/23 2127 10/30/23 2133   10/30/23 2130  ceFEPIme  (MAXIPIME ) 2 g in sodium chloride  0.9 % 100 mL IVPB  Status:  Discontinued        2 g 200 mL/hr over 30 Minutes Intravenous  Once 10/30/23 2127 10/30/23 2140   10/30/23 2030  ceFEPIme  (MAXIPIME ) 2 g in sodium chloride  0.9 % 100 mL IVPB        2 g 200 mL/hr over 30 Minutes Intravenous  Once 10/30/23 2023 10/30/23 2205   10/30/23 2015  metroNIDAZOLE  (FLAGYL ) IVPB 500 mg        500 mg 100 mL/hr over 60 Minutes Intravenous  Once 10/30/23 2000 10/30/23 2330   10/30/23 2000  cefTRIAXone  (ROCEPHIN ) 2 g in sodium chloride  0.9 % 100 mL IVPB  Status:  Discontinued        2 g 200 mL/hr over 30 Minutes Intravenous Once 10/30/23 1953 10/30/23 2000   10/30/23 2000  vancomycin  (VANCOCIN ) IVPB 1000 mg/200 mL premix        1,000 mg 200 mL/hr over 60 Minutes Intravenous  Once 10/30/23 1953 10/30/23 2103       Significant Hospital Events: Including procedures, antibiotic start and stop dates in addition to other pertinent events   9/10: Admitted by TRH for sepsis due to bilateral LE cellulitis and AKI.   9/11: Pt becoming more hypotensive concerning for possible developing septic shock, PCCM consulted in case were to need vasopressors.  Nephrology consulted for AKI, started on Bicarb gtt.  Left femoral CVC placed due to limited peripheral IV access and vasopressor needs.  Interim History / Subjective:  As outlined above under Significant Hospital Events section  Objective   Blood pressure (!) 88/56, pulse (!) 101, temperature (!) 97.5 F (36.4 C), resp. rate 20, height 5' 8 (1.727 m), weight 64.2 kg, SpO2 100%.        Intake/Output Summary (Last 24 hours) at 10/31/2023 1355 Last data filed at 10/31/2023 0101 Gross per 24 hour  Intake 1150.04 ml  Output --  Net 1150.04 ml   Filed Weights   10/30/23 1925 10/31/23 1346   Weight: 70.3 kg 64.2 kg    Examination: General: Acute on chronically frail appearing elderly female, sitting in bed, on room air, no acute distress HENT: Atraumatic, normocephalic, neck supple, no JVD Lungs: Mild expiratory wheezing throughout, even, nonlabored, normal effort Cardiovascular: Regular rate and rhythm (sinus rhythm on telemetry), S1-S2, no murmurs, rubs, gallops Abdomen: Soft, slightly tender to palpation, nondistended, no guarding or rebound tenderness, bowel sounds positive x 4 Extremities: Severe cellulitis to bilateral lower extremities with wounds to left lateral shin and right shin (see images below), normal bulk  and tone, no deformities Neuro: Awake and alert, oriented x 4, moves all extremities commands, no focal deficits noted, speech clear, pupils PERRLA GU: Deferred     Resolved Hospital Problem list     Assessment & Plan:   #Shock: Septic  #Chronic HFpEF PMHx: Dyslipidemia, Hypotension   Echocardiogram 09/13/23: LVEF 60-65%, grade I Diastolic dysfunction, RV systolic function normal, RV size normal -Continuous cardiac monitoring -Maintain MAP >65 -Gentle IV fluids -Vasopressors as needed to maintain MAP goal -Continue home midodrine   -Trend lactic acid until normalized -Consider repeat Echocardiogram  -Diuresis as BP and renal function permits ~ holding due to shock and AKI  #Meets SIRS Criteria #Severe Sepsis due to ... #Serratia Bacteremia from severe Bilateral Lower Extremity Cellulitis -Monitor fever curve -Trend WBC's & Procalcitonin -Follow cultures as above -Continue empiric Cefepime  pending cultures & sensitivities ~ low threshold for ID consultation  -Obtain CT of bilateral LE to rule out necrotizing fasc. Or osteomyelitis  -Obtain Venous US  of Bilateral LE to rule out DVT  #Acute Kidney Injury #Anion Gap Metabolic Acidosis #Mild Hyperkalemia #Mild Hyponatremia  -Monitor I&O's / urinary output -Follow BMP -Ensure adequate renal  perfusion -Avoid nephrotoxic agents as able -Replace electrolytes as indicated ~ Pharmacy following for assistance with electrolyte replacement -Start Bicarb gtt -Consult Nephrology, appreciate input   #Acute on Chronic Hypoxic Respiratory Failure #Acute COPD Exacerbation  PMHx: COPD requiring home oxygen  -Supplemental O2 as needed to maintain O2 sats 88 to 92% -BiPAP if needed -Follow intermittent Chest X-ray & ABG as needed -Bronchodilators  -IV Steroids -ABX as above -Pulmonary toilet as able     Pt is critically ill with multiorgan failure. Prognosis is guarded, high risk for further decompensation, cardiac arrest, and death.  Given current critical illness superimposed on multiple chronic co-morbidities and advanced age, overall long term prognosis is poor.  Recommend consideration of DNR/DNI status.  Will consult Palliative Care to assist with GOC discussions.   Best Practice (right click and Reselect all SmartList Selections daily)   Diet/type: Regular consistency (see orders) DVT prophylaxis: LMWH GI prophylaxis: PPI Lines: Left femoral CVC placed 9/11 Foley:  N/A Code Status:  full code Last date of multidisciplinary goals of care discussion [N/A]  9/11: Pt updated at bedside on plan of care.  Labs   CBC: Recent Labs  Lab 10/30/23 1928 10/31/23 0610  WBC 10.1 7.7  NEUTROABS 8.9*  --   HGB 11.8* 9.6*  HCT 36.9 28.9*  MCV 97.4 96.3  PLT 367 173    Basic Metabolic Panel: Recent Labs  Lab 10/30/23 1928 10/31/23 0610  NA 128* 131*  K 6.0* 5.2*  CL 102 105  CO2 13* 18*  GLUCOSE 126* 62*  BUN 42* 40*  CREATININE 3.64* 3.29*  CALCIUM  7.8* 7.2*   GFR: Estimated Creatinine Clearance: 17.7 mL/min (A) (by C-G formula based on SCr of 3.29 mg/dL (H)). Recent Labs  Lab 10/30/23 1928 10/30/23 2213 10/31/23 0610  WBC 10.1  --  7.7  LATICACIDVEN 2.6* 2.5*  --     Liver Function Tests: Recent Labs  Lab 10/30/23 1928  AST 25  ALT 9  ALKPHOS 123   BILITOT 1.2  PROT 5.7*  ALBUMIN  2.0*   No results for input(s): LIPASE, AMYLASE in the last 168 hours. No results for input(s): AMMONIA in the last 168 hours.  ABG    Component Value Date/Time   HCO3 38.2 (H) 09/08/2021 1647   O2SAT 51.7 09/08/2021 1647     Coagulation Profile: Recent  Labs  Lab 10/30/23 1928 10/31/23 0610  INR 1.3* 1.7*    Cardiac Enzymes: Recent Labs  Lab 10/30/23 2213  CKTOTAL 38    HbA1C: Hemoglobin A1C  Date/Time Value Ref Range Status  05/01/2023 02:51 PM 6.0 (A) 4.0 - 5.6 % Final  11/01/2022 10:09 AM 5.9 (A) 4.0 - 5.6 % Final  10/09/2012 04:52 AM 7.4 (H) 4.2 - 6.3 % Final    Comment:    The American Diabetes Association recommends that a primary goal of therapy should be <7% and that physicians should reevaluate the treatment regimen in patients with HbA1c values consistently >8%.   07/02/2012 05:33 AM 7.1 (H) 4.2 - 6.3 % Final    Comment:    The American Diabetes Association recommends that a primary goal of therapy should be <7% and that physicians should reevaluate the treatment regimen in patients with HbA1c values consistently >8%.    HbA1c, POC (controlled diabetic range)  Date/Time Value Ref Range Status  03/19/2018 02:04 PM 13.7 (A) 0.0 - 7.0 % Final  10/29/2017 02:18 PM 14.0 (A) 0.0 - 7.0 % Final    Comment:    >14.0   Hgb A1c MFr Bld  Date/Time Value Ref Range Status  10/30/2023 10:52 PM 5.1 4.8 - 5.6 % Final    Comment:    (NOTE) Diagnosis of Diabetes The following HbA1c ranges recommended by the American Diabetes Association (ADA) may be used as an aid in the diagnosis of diabetes mellitus.  Hemoglobin             Suggested A1C NGSP%              Diagnosis  <5.7                   Non Diabetic  5.7-6.4                Pre-Diabetic  >6.4                   Diabetic  <7.0                   Glycemic control for                       adults with diabetes.    06/05/2021 02:54 PM 6.6 (H) <5.7 % of total Hgb  Final    Comment:    For someone without known diabetes, a hemoglobin A1c value of 6.5% or greater indicates that they may have  diabetes and this should be confirmed with a follow-up  test. . For someone with known diabetes, a value <7% indicates  that their diabetes is well controlled and a value  greater than or equal to 7% indicates suboptimal  control. A1c targets should be individualized based on  duration of diabetes, age, comorbid conditions, and  other considerations. . Currently, no consensus exists regarding use of hemoglobin A1c for diagnosis of diabetes for children. .     CBG: Recent Labs  Lab 10/31/23 1018 10/31/23 1057 10/31/23 1128 10/31/23 1224 10/31/23 1340  GLUCAP 66* 60* 82 96 62*    Review of Systems:   Positives in BOLD: Gen: Denies fever, chills, weight change, fatigue, night sweats HEENT: Denies blurred vision, double vision, hearing loss, tinnitus, sinus congestion, rhinorrhea, sore throat, neck stiffness, dysphagia PULM: Denies shortness of breath, cough, sputum production, hemoptysis, wheezing CV: Denies chest pain, edema, orthopnea, paroxysmal nocturnal dyspnea, palpitations GI: Denies abdominal pain, nausea,  vomiting, diarrhea, hematochezia, melena, constipation, change in bowel habits GU: Denies dysuria, hematuria, polyuria, oliguria, urethral discharge Endocrine: Denies hot or cold intolerance, polyuria, polyphagia or appetite change Derm: Denies rash, dry skin, scaling or peeling skin change, cellulitis of bilateral lower extremities  Heme: Denies easy bruising, bleeding, bleeding gums Neuro: Denies headache, numbness, weakness, slurred speech, loss of memory or consciousness   Past Medical History:  She,  has a past medical history of Anxiety, Arthritis, Asthma, Benign essential tremor, Cervical dystonia, Cholesteatoma of left ear, COPD (chronic obstructive pulmonary disease) (HCC), Cough, Depression, Diabetes mellitus without  complication (HCC), Diastolic dysfunction, Dyspnea, Dysrhythmia, GERD (gastroesophageal reflux disease), Headache, HOH (hard of hearing), Hyperlipidemia, Hypertension, Motion sickness, Neuromuscular disorder (HCC), and Wears dentures.   Surgical History:   Past Surgical History:  Procedure Laterality Date   CARPAL TUNNEL RELEASE Bilateral    x2 right, 1x on left   COLONOSCOPY     COLONOSCOPY WITH PROPOFOL  N/A 04/25/2017   Procedure: COLONOSCOPY WITH PROPOFOL ;  Surgeon: Jinny Carmine, MD;  Location: [facility];  Service: Endoscopy;  Laterality: N/A;  diabetic-oral med   DILATION AND CURETTAGE OF UTERUS     ESOPHAGOGASTRODUODENOSCOPY (EGD) WITH PROPOFOL  N/A 01/10/2021   Procedure: ESOPHAGOGASTRODUODENOSCOPY (EGD) WITH PROPOFOL ;  Surgeon: Janalyn Kobee Medlen NOVAK, MD;  Location: [facility];  Service: Endoscopy;  Laterality: N/A;  Diabetic   EXTERNAL EAR SURGERY Left    x2   POLYPECTOMY  04/25/2017   Procedure: POLYPECTOMY INTESTINAL;  Surgeon: Jinny Carmine, MD;  Location: [facility];  Service: Endoscopy;;   SPINE SURGERY     herniated disc   TUBAL LIGATION       Social History:   reports that she has been smoking cigarettes. She started smoking about 46 years ago. She has a 46.4 pack-year smoking history. She has never used smokeless tobacco. She reports that she does not drink alcohol and does not use drugs.   Family History:  Her family history includes Anxiety disorder in her mother; Bipolar disorder in her daughter and daughter; Cervical cancer in her daughter; Diabetes in her daughter; Drug abuse in her daughter; Emphysema in her mother; Hypertension in her daughter; Lung cancer in her maternal aunt, maternal grandfather, maternal grandmother, and maternal uncle; Multiple sclerosis in her daughter; Stroke in her father; Throat cancer in her father.   Allergies Allergies  Allergen Reactions   Augmentin [Amoxicillin-Pot Clavulanate] Diarrhea   Penicillins Itching      Home Medications  Prior to Admission medications   Medication Sig Start Date End Date Taking? Authorizing Provider  ascorbic acid  (VITAMIN C ) 500 MG tablet Take 1 tablet (500 mg total) by mouth 2 (two) times daily. 09/27/23  Yes Marsa Edelman, DO  aspirin  EC 81 MG tablet Take 81 mg by mouth daily. Swallow whole.   Yes [provider]  calcium  carbonate (TUMS - DOSED IN MG ELEMENTAL CALCIUM ) 500 MG chewable tablet Chew 2 tablets (400 mg of elemental calcium  total) by mouth 3 (three) times daily as needed for indigestion or heartburn. 09/30/23  Yes Alexander, Natalie, DO  carbidopa -levodopa  (SINEMET  CR) 50-200 MG tablet Take 1 tablet by mouth at bedtime. 07/26/23 07/25/24 Yes [provider]  carbidopa -levodopa  (SINEMET  IR) 25-100 MG tablet Take 1.5 tablets by mouth 3 (three) times daily. 01/25/20 10/30/23 Yes Maree Jannett POUR, MD  colchicine  0.6 MG tablet Take 1 tablet (0.6 mg total) by mouth 2 (two) times daily as needed for up to 3 days (gout flare). 09/27/23 10/30/23 Yes Alexander,  Natalie, DO  cyclobenzaprine  (FLEXERIL ) 10 MG tablet Take 10 mg by mouth at bedtime. 08/01/21  Yes [provider]  diphenoxylate -atropine  (LOMOTIL ) 2.5-0.025 MG tablet Take 2 tablets by mouth 4 (four) times daily as needed for diarrhea or loose stools. 09/27/23  Yes Alexander, Natalie, DO  DULoxetine  (CYMBALTA ) 60 MG capsule Take 1 capsule (60 mg total) by mouth daily. 05/01/23  Yes Sowles, Krichna, MD  empagliflozin  (JARDIANCE ) 25 MG TABS tablet Take 1 tablet (25 mg total) by mouth daily before breakfast. 05/01/23  Yes Sowles, Krichna, MD  furosemide  (LASIX ) 40 MG tablet Take 1 tablet (40 mg total) by mouth daily as needed. 06/21/17  Yes [name], Almarie BRAVO, NP  ipratropium-albuterol  (DUONEB) 0.5-2.5 (3) MG/3ML SOLN Inhale 3 mLs into the lungs every 6 (six) hours as needed. 06/21/17  Yes [name], Laneshia E, NP  leptospermum manuka honey (MEDIHONEY) PSTE paste Apply 1 Application topically daily. Apply to  sacral wound Apply thin layer (3 mm) to wound. 09/28/23  Yes Alexander, Natalie, DO  levocetirizine (XYZAL ) 5 MG tablet Take 1 tablet (5 mg total) by mouth every evening. 05/01/23  Yes Sowles, Krichna, MD  midodrine  (PROAMATINE ) 5 MG tablet Take 1 tablet (5 mg total) by mouth 3 (three) times daily with meals. 09/27/23  Yes Alexander, Natalie, DO  montelukast  (SINGULAIR ) 10 MG tablet Take 1 tablet by mouth at bedtime. 02/21/21  Yes Fleming, Herbon E, MD  Multiple Vitamin (MULTIVITAMIN WITH MINERALS) TABS tablet Take 1 tablet by mouth daily. 09/28/23  Yes Alexander, Natalie, DO  omeprazole  (PRILOSEC) 40 MG capsule Take 1 capsule (40 mg total) by mouth daily. 05/01/23  Yes Sowles, Krichna, MD  oxyCODONE  (OXY IR/ROXICODONE ) 5 MG immediate release tablet Take 1 tablet (5 mg total) by mouth every 4 (four) hours as needed for moderate pain (pain score 4-6) or severe pain (pain score 7-10). 09/27/23  Yes Marsa Edelman, DO  QUEtiapine  (SEROQUEL ) 25 MG tablet Take 1 tablet (25 mg total) by mouth at bedtime. 05/01/23  Yes Sowles, Krichna, MD  rosuvastatin  (CRESTOR ) 5 MG tablet Take 1 tablet (5 mg total) by mouth at bedtime. 05/01/23  Yes Sowles, Krichna, MD  SYMBICORT  160-4.5 MCG/ACT inhaler INHALE 2 PUFFS TWICE A DAY RINSE MOUTH WITH WATER  AFTER EACH USE 07/09/22  Yes Sowles, Krichna, MD  theophylline  (UNIPHYL) 400 MG 24 hr tablet Take 1 tablet by mouth daily.  12/27/17  Yes Fleming, Herbon E, MD  tiotropium (SPIRIVA ) 18 MCG inhalation capsule Place 1 capsule into inhaler and inhale daily. pm 08/27/13  Yes Theotis Lavelle BRAVO, MD  VENTOLIN  HFA 108 (90 Base) MCG/ACT inhaler INHALE 1 PUFF BY MOUTH AS NEEDED 04/26/20  Yes Sowles, Krichna, MD  zinc  sulfate, 50mg  elemental zinc , 220 (50 Zn) MG capsule Take 1 capsule (220 mg total) by mouth daily. 09/28/23  Yes Marsa Edelman, DO     Critical care time: 55 minutes     Inge Lecher, AGACNP-BC [facility] Prefer epic messenger for cross cover needs If  after hours, please call E-link

## 2023-10-31 NOTE — ED Notes (Signed)
 MD made aware of pt unable to take PO at this time and hypoglycemia.

## 2023-10-31 NOTE — ED Notes (Addendum)
 MD made aware that pt's BP 84/50 MAP of 62.

## 2023-10-31 NOTE — Progress Notes (Signed)
 1345 patient received from ED via stretcher. Patient covered in stool.Patient confused-knew name and birthday. Blood pressure low. Patient hypoglycemic upon admission.  Overall skin in terrible shape. Covered with bruise and scapes. Sacral wound from July still present and purple in color. Bilateral lower legs red,hot and edemetous. Left leg with skin tear and weeping. Pedal and posterior tibial pulses dopplered only. Given 1 amp of D50. Recheck was 77 but no further was ordered. J.Keene NP aware of low CBG. 1500 TLC placed after 2 attempted.1546 patient placed on Levophed  immediately as well as Bicarb drips. In ED Speech Therapy evaluated patient and made her NPO. This was explained to patient several times but she requested drinks anyway.  Patient cleaned of 2nd BM after L femoral TLC placed. 1700 Patient sleeping. Arouses easily.  Doppler studies of lower legs done.

## 2023-10-31 NOTE — ED Notes (Signed)
 MD made aware pt CBG 60 while on D5 gtt. Standing hypovolemia protocols in place.

## 2023-10-31 NOTE — ED Notes (Signed)
 MD made aware pt CBG 54 at this time.

## 2023-10-31 NOTE — ED Notes (Signed)
 MD made aware of pt continuous low BP

## 2023-10-31 NOTE — Procedures (Signed)
 Central Venous Catheter Insertion Procedure Note  Tricia [name]  [id number]  1960/10/06  Date:10/31/23  Time:3:27 PM   Provider Performing:Dylann Layne D Shellia   Procedure: Insertion of Non-tunneled Central Venous [phone]) with US  guidance ([id number])   Indication(s) Medication administration and Difficult access  Consent Risks of the procedure as well as the alternatives and risks of each were explained to the patient and/or caregiver.  Consent for the procedure was obtained and is signed in the bedside chart  Anesthesia Topical only with 1% lidocaine    Timeout Verified patient identification, verified procedure, site/side was marked, verified correct patient position, special equipment/implants available, medications/allergies/relevant history reviewed, required imaging and test results available.  Sterile Technique Maximal sterile technique including full sterile barrier drape, hand hygiene, sterile gown, sterile gloves, mask, hair covering, sterile ultrasound probe cover (if used).  Procedure Description Area of catheter insertion was cleaned with chlorhexidine  and draped in sterile fashion.  With real-time ultrasound guidance a central venous catheter was placed into the left femoral vein. Nonpulsatile blood flow and easy flushing noted in all ports.  The catheter was sutured in place and sterile dressing applied.  Complications/Tolerance None; patient tolerated the procedure well. Chest X-ray is ordered to verify placement for internal jugular or subclavian cannulation.   Chest x-ray is not ordered for femoral cannulation.  EBL Minimal  Specimen(s) None    Line inserted to the 20 cm mark.   Inge Shellia, AGACNP-BC [facility] Prefer epic messenger for cross cover needs If after hours, please call E-link

## 2023-10-31 NOTE — Consult Note (Signed)
 [facility]  CONSULT NOTE    Date: 10/31/2023                  Patient Name:  Tricia [name]  MRN: [id number]  DOB: February 05, 1961  Age / Sex: 63 y.o., female         PCP: Glenard Mire, MD                 Service Requesting Consult: Critical Care                 Reason for Consult: Acute kidney injury            History of Present Illness: Tricia [name] is a 63 y.o.  female with past medical history including COPD, type 2 diabetes, anxiety, depression, GERD and hypertension, who was admitted to [facility] on 10/30/2023 for Hyperkalemia [E87.5] Hyponatremia [E87.1] Weakness [R53.1] Leg swelling [M79.89] Wound infection [T14.8XXA, L08.9] AKI (acute kidney injury) (HCC) [N17.9] Sepsis (HCC) [A41.9] Sepsis due to cellulitis (HCC) [L03.90, A41.9] Sepsis, due to unspecified organism, unspecified whether acute organ dysfunction present [facility]) [A41.9]  Patient presents to ED with weakness and lower extremity edema. Patient is seen and evaluated at bedside in ICU. She is alert and oriented. States shee had been feeling unwell at home for a few days prior to presentation. Chart review states she was found on the floor by her son. Believed to be there several hours. She remains on room air. Blood pressure soft, no pressors in place.   Labs on ED arrival include sodium 128, potassium 6.0, s bicarb 13, creatinine 3.64 with GFR 13 and Albumin  2.0. Lactic acid 2.6. Normal renal function last month. Chest xray shows emphysema.    Medications: Outpatient medications: Medications Prior to Admission  Medication Sig Dispense Refill Last Dose/Taking   ascorbic acid  (VITAMIN C ) 500 MG tablet Take 1 tablet (500 mg total) by mouth 2 (two) times daily.   Taking   aspirin  EC 81 MG tablet Take 81 mg by mouth daily. Swallow whole.   Taking   calcium  carbonate (TUMS - DOSED IN MG ELEMENTAL CALCIUM ) 500 MG chewable tablet Chew 2 tablets (400 mg of elemental calcium  total)  by mouth 3 (three) times daily as needed for indigestion or heartburn.   Taking As Needed   carbidopa -levodopa  (SINEMET  CR) 50-200 MG tablet Take 1 tablet by mouth at bedtime.   Taking   carbidopa -levodopa  (SINEMET  IR) 25-100 MG tablet Take 1.5 tablets by mouth 3 (three) times daily.   Taking   colchicine  0.6 MG tablet Take 1 tablet (0.6 mg total) by mouth 2 (two) times daily as needed for up to 3 days (gout flare).   Taking As Needed   cyclobenzaprine  (FLEXERIL ) 10 MG tablet Take 10 mg by mouth at bedtime.   Taking   diphenoxylate -atropine  (LOMOTIL ) 2.5-0.025 MG tablet Take 2 tablets by mouth 4 (four) times daily as needed for diarrhea or loose stools. 30 tablet 0 Taking As Needed   DULoxetine  (CYMBALTA ) 60 MG capsule Take 1 capsule (60 mg total) by mouth daily. 90 capsule 1 Taking   empagliflozin  (JARDIANCE ) 25 MG TABS tablet Take 1 tablet (25 mg total) by mouth daily before breakfast. 90 tablet 1 Taking   furosemide  (LASIX ) 40 MG tablet Take 1 tablet (40 mg total) by mouth daily as needed. 30 tablet 2 Taking As Needed   ipratropium-albuterol  (DUONEB) 0.5-2.5 (3) MG/3ML SOLN Inhale 3 mLs into the lungs every 6 (six) hours as needed.  360 mL 3 Taking As Needed   leptospermum manuka honey (MEDIHONEY) PSTE paste Apply 1 Application topically daily. Apply to sacral wound Apply thin layer (3 mm) to wound.   Taking   levocetirizine (XYZAL ) 5 MG tablet Take 1 tablet (5 mg total) by mouth every evening. 90 tablet 1 Taking   midodrine  (PROAMATINE ) 5 MG tablet Take 1 tablet (5 mg total) by mouth 3 (three) times daily with meals.   Taking   montelukast  (SINGULAIR ) 10 MG tablet Take 1 tablet by mouth at bedtime.   Taking   Multiple Vitamin (MULTIVITAMIN WITH MINERALS) TABS tablet Take 1 tablet by mouth daily.   Taking   omeprazole  (PRILOSEC) 40 MG capsule Take 1 capsule (40 mg total) by mouth daily. 90 capsule 0 Taking   oxyCODONE  (OXY IR/ROXICODONE ) 5 MG immediate release tablet Take 1 tablet (5 mg total) by  mouth every 4 (four) hours as needed for moderate pain (pain score 4-6) or severe pain (pain score 7-10). 30 tablet 0 Taking As Needed   QUEtiapine  (SEROQUEL ) 25 MG tablet Take 1 tablet (25 mg total) by mouth at bedtime. 90 tablet 1 Taking   rosuvastatin  (CRESTOR ) 5 MG tablet Take 1 tablet (5 mg total) by mouth at bedtime. 90 tablet 1 Taking   SYMBICORT  160-4.5 MCG/ACT inhaler INHALE 2 PUFFS TWICE A DAY RINSE MOUTH WITH WATER  AFTER EACH USE 10.2 g 2 Taking   theophylline  (UNIPHYL) 400 MG 24 hr tablet Take 1 tablet by mouth daily.    Taking   tiotropium (SPIRIVA ) 18 MCG inhalation capsule Place 1 capsule into inhaler and inhale daily. pm   Taking   VENTOLIN  HFA 108 (90 Base) MCG/ACT inhaler INHALE 1 PUFF BY MOUTH AS NEEDED 18 g 0 Taking   zinc  sulfate, 50mg  elemental zinc , 220 (50 Zn) MG capsule Take 1 capsule (220 mg total) by mouth daily.   Taking    Current medications: Current Facility-Administered Medications  Medication Dose Route Frequency Provider Last Rate Last Admin   acetaminophen  (TYLENOL ) tablet 650 mg  650 mg Oral Q6H PRN Mansy, Jan A, MD       Or   acetaminophen  (TYLENOL ) suppository 650 mg  650 mg Rectal Q6H PRN Mansy, Jan A, MD       ascorbic acid  (VITAMIN C ) tablet 500 mg  500 mg Oral BID Mansy, Jan A, MD   500 mg at 10/30/23 2222   aspirin  EC tablet 81 mg  81 mg Oral Daily Mansy, Jan A, MD       calcium  carbonate (TUMS - dosed in mg elemental calcium ) chewable tablet 400 mg of elemental calcium   400 mg of elemental calcium  Oral TID PRN Mansy, Jan A, MD       carbidopa -levodopa  (SINEMET  CR) 50-200 MG per tablet controlled release 1 tablet  1 tablet Oral QHS Mansy, Jan A, MD   1 tablet at 10/30/23 2343   carbidopa -levodopa  (SINEMET  IR) 25-100 MG per tablet immediate release 1.5 tablet  1.5 tablet Oral TID Mansy, Jan A, MD   1.5 tablet at 10/30/23 2222   ceFEPIme  (MAXIPIME ) 2 g in sodium chloride  0.9 % 100 mL IVPB  2 g Intravenous Q24H Mansy, Jan A, MD       cetirizine  (ZYRTEC )  tablet 10 mg  10 mg Oral QPM Mansy, Jan A, MD   10 mg at 10/30/23 2222   Chlorhexidine  Gluconate Cloth 2 % PADS 6 each  6 each Topical Daily Lenon Marien CROME, MD  colchicine  tablet 0.6 mg  0.6 mg Oral BID PRN Mansy, Jan A, MD       cyclobenzaprine  (FLEXERIL ) tablet 10 mg  10 mg Oral QHS Mansy, Jan A, MD   10 mg at 10/30/23 2222   dextrose  50 % solution            dextrose  50 % solution            diphenoxylate -atropine  (LOMOTIL ) 2.5-0.025 MG per tablet 2 tablet  2 tablet Oral QID PRN Mansy, Jan A, MD       DULoxetine  (CYMBALTA ) DR capsule 60 mg  60 mg Oral Daily Mansy, Jan A, MD       enoxaparin  (LOVENOX ) injection 30 mg  30 mg Subcutaneous Q24H Mansy, Jan A, MD   30 mg at 10/30/23 2230   fluticasone  furoate-vilanterol (BREO ELLIPTA ) 200-25 MCG/ACT 1 puff  1 puff Inhalation Daily Mansy, Jan A, MD       ipratropium-albuterol  (DUONEB) 0.5-2.5 (3) MG/3ML nebulizer solution 3 mL  3 mL Inhalation Q6H PRN Mansy, Jan A, MD       magnesium  hydroxide (MILK OF MAGNESIA) suspension 30 mL  30 mL Oral Daily PRN Mansy, Jan A, MD       midodrine  (PROAMATINE ) tablet 10 mg  10 mg Oral TID WC Lenon Marien CROME, MD       montelukast  (SINGULAIR ) tablet 10 mg  10 mg Oral QHS Mansy, Jan A, MD   10 mg at 10/30/23 2221   multivitamin with minerals tablet 1 tablet  1 tablet Oral Daily Mansy, Jan A, MD       norepinephrine  (LEVOPHED ) 16 mg in (0.064 mg/mL) premix infusion  0-40 mcg/min Intravenous Titrated Aleskerov, Fuad, MD       ondansetron  (ZOFRAN ) tablet 4 mg  4 mg Oral Q6H PRN Mansy, Jan A, MD       Or   ondansetron  (ZOFRAN ) injection 4 mg  4 mg Intravenous Q6H PRN Mansy, Jan A, MD       pantoprazole  (PROTONIX ) EC tablet 40 mg  40 mg Oral Daily Mansy, Jan A, MD       rosuvastatin  (CRESTOR ) tablet 5 mg  5 mg Oral QHS Mansy, Jan A, MD   5 mg at 10/30/23 2223   sodium bicarbonate  150 mEq in dextrose  5 % 1,150 mL infusion   Intravenous Continuous Shellia Inge BIRCH, NP       theophylline  (UNIPHYL) 400  MG 24 hr tablet 400 mg  400 mg Oral Daily Mansy, Jan A, MD       traZODone  (DESYREL ) tablet 25 mg  25 mg Oral QHS PRN Mansy, Jan A, MD       umeclidinium bromide  (INCRUSE ELLIPTA ) 62.5 MCG/ACT 1 puff  1 puff Inhalation Daily Mansy, Jan A, MD       zinc  sulfate (50mg  elemental zinc ) capsule 220 mg  220 mg Oral Daily Mansy, Madison LABOR, MD          Allergies: Allergies  Allergen Reactions   Augmentin [Amoxicillin-Pot Clavulanate] Diarrhea   Penicillins Itching      Past Medical History: Past Medical History:  Diagnosis Date   Anxiety    Arthritis    joints and hands/ knees   Asthma    uses inhaler   Benign essential tremor    head   Cervical dystonia    neck pain   Cholesteatoma of left ear    x2   COPD (chronic obstructive pulmonary disease) (HCC)  Cough    Depression    Diabetes mellitus without complication (HCC)    type 2   Diastolic dysfunction    Dyspnea    Dysrhythmia    diastolic dysfunction   GERD (gastroesophageal reflux disease)    Headache    migraines/ one per week   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   Hyperlipidemia    Hypertension    Motion sickness    boat   Neuromuscular disorder (HCC)    neuropathy feet and hands( nerve damage)   Wears dentures    upper and lower     Past Surgical History: Past Surgical History:  Procedure Laterality Date   CARPAL TUNNEL RELEASE Bilateral    x2 right, 1x on left   COLONOSCOPY     COLONOSCOPY WITH PROPOFOL  N/A 04/25/2017   Procedure: COLONOSCOPY WITH PROPOFOL ;  Surgeon: Jinny Carmine, MD;  Location: [facility];  Service: Endoscopy;  Laterality: N/A;  diabetic-oral med   DILATION AND CURETTAGE OF UTERUS     ESOPHAGOGASTRODUODENOSCOPY (EGD) WITH PROPOFOL  N/A 01/10/2021   Procedure: ESOPHAGOGASTRODUODENOSCOPY (EGD) WITH PROPOFOL ;  Surgeon: Janalyn Keene NOVAK, MD;  Location: [facility];  Service: Endoscopy;  Laterality: N/A;  Diabetic   EXTERNAL EAR SURGERY Left    x2    POLYPECTOMY  04/25/2017   Procedure: POLYPECTOMY INTESTINAL;  Surgeon: Jinny Carmine, MD;  Location: [facility];  Service: Endoscopy;;   SPINE SURGERY     herniated disc   TUBAL LIGATION       Family History: Family History  Problem Relation Age of Onset   Emphysema Mother    Anxiety disorder Mother    Stroke Father    Throat cancer Father    Lung cancer Maternal Grandmother    Lung cancer Maternal Grandfather    Hypertension Daughter    Diabetes Daughter    Multiple sclerosis Daughter    Bipolar disorder Daughter    Cervical cancer Daughter    Bipolar disorder Daughter    Drug abuse Daughter    Lung cancer Maternal Aunt    Lung cancer Maternal Uncle      Social History: Social History   Socioeconomic History   Marital status: Widowed    Spouse name: Not on file   Number of children: 2   Years of education: Not on file   Highest education level: Associate degree: academic program  Occupational History   Occupation: Disability  Tobacco Use   Smoking status: Every Day    Current packs/day: 1.00    Average packs/day: 1 pack/day for 46.4 years (46.4 ttl pk-yrs)    Types: Cigarettes    Start date: 05/19/1977   Smokeless tobacco: Never  Vaping Use   Vaping status: Former  Substance and Sexual Activity   Alcohol use: No    Alcohol/week: 0.0 standard drinks of alcohol   Drug use: No   Sexual activity: Not Currently    Birth control/protection: None  Other Topics Concern   Not on file  Social History Narrative   Living with her daughter now    Social Drivers of Health   Financial Resource Strain: Patient Declined (06/18/2023)   Received from [facility]   Overall Financial Resource Strain (CARDIA)    Difficulty of Paying Living Expenses: Patient declined  Food Insecurity: No Food Insecurity (09/22/2023)   Hunger Vital Sign    Worried About Running Out of Food in the Last Year: Never true  Ran Out of Food in the Last Year: Never true   Transportation Needs: No Transportation Needs (09/22/2023)   PRAPARE - Administrator, Civil Service (Medical): No    Lack of Transportation (Non-Medical): No  Physical Activity: Sufficiently Active (03/07/2023)   Exercise Vital Sign    Days of Exercise per Week: 7 days    Minutes of Exercise per Session: 30 min  Stress: No Stress Concern Present (03/07/2023)   [employer] of Occupational Health - Occupational Stress Questionnaire    Feeling of Stress : Only a little  Social Connections: Socially Isolated (08/29/2023)   Social Connection and Isolation Panel    Frequency of Communication with Friends and Family: More than three times a week    Frequency of Social Gatherings with Friends and Family: Never    Attends Religious Services: Never    Database administrator or Organizations: No    Attends Banker Meetings: Never    Marital Status: Divorced  Catering manager Violence: Not At Risk (09/22/2023)   Humiliation, Afraid, Rape, and Kick questionnaire    Fear of Current or Ex-Partner: No    Emotionally Abused: No    Physically Abused: No    Sexually Abused: No     Review of Systems: Review of Systems  Constitutional:  Negative for chills, fever and malaise/fatigue.  HENT:  Negative for congestion, sore throat and tinnitus.   Eyes:  Negative for blurred vision and redness.  Respiratory:  Negative for cough, shortness of breath and wheezing.   Cardiovascular:  Positive for leg swelling. Negative for chest pain, palpitations and claudication.  Gastrointestinal:  Negative for abdominal pain, blood in stool, diarrhea, nausea and vomiting.  Genitourinary:  Negative for flank pain, frequency and hematuria.  Musculoskeletal:  Positive for falls. Negative for back pain and myalgias.  Skin:  Negative for rash.  Neurological:  Positive for weakness. Negative for dizziness and headaches.  Endo/Heme/Allergies:  Does not bruise/bleed easily.  Psychiatric/Behavioral:   Negative for depression. The patient is not nervous/anxious and does not have insomnia.     Vital Signs: Blood pressure (!) 88/56, pulse (!) 101, temperature (!) 97.5 F (36.4 C), resp. rate 20, height 5' 8 (1.727 m), weight 64.2 kg, SpO2 100%.  Weight trends: Filed Weights   10/30/23 1925 10/31/23 1346  Weight: 70.3 kg 64.2 kg    Physical Exam: General: NAD, ill appearing  Head: Normocephalic, atraumatic. Dry oral mucosal membranes  Eyes: Anicteric  Neck: Supple  Lungs:  Clear to auscultation, normal effort  Heart: Regular rate and rhythm  Abdomen:  Soft, nontender,   Extremities:  + peripheral edema.  Neurologic: Nonfocal, moving all four extremities  Skin: BLE erythema   Access: None     Lab results: Basic Metabolic Panel: Recent Labs  Lab 10/30/23 1928 10/31/23 0610  NA 128* 131*  K 6.0* 5.2*  CL 102 105  CO2 13* 18*  GLUCOSE 126* 62*  BUN 42* 40*  CREATININE 3.64* 3.29*  CALCIUM  7.8* 7.2*    Liver Function Tests: Recent Labs  Lab 10/30/23 1928  AST 25  ALT 9  ALKPHOS 123  BILITOT 1.2  PROT 5.7*  ALBUMIN  2.0*   No results for input(s): LIPASE, AMYLASE in the last 168 hours. No results for input(s): AMMONIA in the last 168 hours.  CBC: Recent Labs  Lab 10/30/23 1928 10/31/23 0610  WBC 10.1 7.7  NEUTROABS 8.9*  --   HGB 11.8* 9.6*  HCT 36.9 28.9*  MCV 97.4 96.3  PLT 367 173    Cardiac Enzymes: Recent Labs  Lab 10/30/23 2213  CKTOTAL 38    BNP: Invalid input(s): POCBNP  CBG: Recent Labs  Lab 10/31/23 1057 10/31/23 1128 10/31/23 1224 10/31/23 1340 10/31/23 1412  GLUCAP 60* 82 96 62* 77    Microbiology: Results for orders placed or performed during the hospital encounter of 10/30/23  Blood culture (routine x 2)     Status: None (Preliminary result)   Collection Time: 10/30/23  7:28 PM   Specimen: BLOOD  Result Value Ref Range Status   Specimen Description   Final    BLOOD LEFT ANTECUBITAL Performed at  [facility], [address] [place], [place] [address]    Special Requests   Final    BLOOD Blood Culture adequate volume Performed at [facility], [address] [place], [place] [address]    Culture  Setup Time   Final    GRAM NEGATIVE RODS IN BOTH AEROBIC AND ANAEROBIC BOTTLES Organism ID to follow CRITICAL RESULT CALLED TO, READ BACK BY AND VERIFIED WITHBETHA ESTILL LUTES PHARMD [id number] 10/31/23 HNM GRAM STAIN REVIEWED-AGREE WITH RESULT DRT Performed at [facility], [address], [place], [place] [address]    Culture GRAM NEGATIVE RODS  Final   Report Status PENDING  Incomplete  Blood Culture ID Panel (Reflexed)     Status: Abnormal   Collection Time: 10/30/23  7:28 PM  Result Value Ref Range Status   Enterococcus faecalis NOT DETECTED NOT DETECTED Final   Enterococcus Faecium NOT DETECTED NOT DETECTED Final   Listeria monocytogenes NOT DETECTED NOT DETECTED Final   Staphylococcus species NOT DETECTED NOT DETECTED Final   Staphylococcus aureus (BCID) NOT DETECTED NOT DETECTED Final   Staphylococcus epidermidis NOT DETECTED NOT DETECTED Final   Staphylococcus lugdunensis NOT DETECTED NOT DETECTED Final   Streptococcus species NOT DETECTED NOT DETECTED Final   Streptococcus agalactiae NOT DETECTED NOT DETECTED Final   Streptococcus pneumoniae NOT DETECTED NOT DETECTED Final   Streptococcus pyogenes NOT DETECTED NOT DETECTED Final   A.calcoaceticus-baumannii NOT DETECTED NOT DETECTED Final   Bacteroides fragilis NOT DETECTED NOT DETECTED Final   Enterobacterales DETECTED (A) NOT DETECTED Final    Comment: Enterobacterales represent a large order of gram negative bacteria, not a single organism. CRITICAL RESULT CALLED TO, READ BACK BY AND VERIFIED WITH: SHEEMA HALLAJI PHARMD [id number] 10/31/23 HNM    Enterobacter cloacae complex NOT DETECTED NOT DETECTED Final   Escherichia coli NOT DETECTED NOT DETECTED Final   Klebsiella aerogenes NOT DETECTED NOT  DETECTED Final   Klebsiella oxytoca NOT DETECTED NOT DETECTED Final   Klebsiella pneumoniae NOT DETECTED NOT DETECTED Final   Proteus species NOT DETECTED NOT DETECTED Final   Salmonella species NOT DETECTED NOT DETECTED Final   Serratia marcescens DETECTED (A) NOT DETECTED Final    Comment: CRITICAL RESULT CALLED TO, READ BACK BY AND VERIFIED WITH: ESTILL LUTES PHARMD [id number] 10/31/23 HNM    Haemophilus influenzae NOT DETECTED NOT DETECTED Final   Neisseria meningitidis NOT DETECTED NOT DETECTED Final   Pseudomonas aeruginosa NOT DETECTED NOT DETECTED Final   Stenotrophomonas maltophilia NOT DETECTED NOT DETECTED Final   Candida albicans NOT DETECTED NOT DETECTED Final   Candida auris NOT DETECTED NOT DETECTED Final   Candida glabrata NOT DETECTED NOT DETECTED Final   Candida krusei NOT DETECTED NOT DETECTED Final   Candida parapsilosis NOT DETECTED NOT DETECTED Final   Candida tropicalis NOT DETECTED NOT DETECTED Final  Cryptococcus neoformans/gattii NOT DETECTED NOT DETECTED Final   CTX-M ESBL NOT DETECTED NOT DETECTED Final   Carbapenem resistance IMP NOT DETECTED NOT DETECTED Final   Carbapenem resistance KPC NOT DETECTED NOT DETECTED Final   Carbapenem resistance NDM NOT DETECTED NOT DETECTED Final   Carbapenem resist OXA 48 LIKE NOT DETECTED NOT DETECTED Final   Carbapenem resistance VIM NOT DETECTED NOT DETECTED Final    Comment: Performed at [facility], [address] [place], [place] [address]  Blood culture (routine x 2)     Status: None (Preliminary result)   Collection Time: 10/30/23  7:55 PM   Specimen: BLOOD  Result Value Ref Range Status   Specimen Description   Final    BLOOD BLOOD RIGHT ARM Performed at [facility], [address], [place], [place] [address]    Special Requests   Final    BOTTLES DRAWN AEROBIC AND ANAEROBIC Blood Culture results may not be optimal due to an inadequate volume of blood received in culture bottles Performed  at [facility], [address], [place], [place] [address]    Culture  Setup Time   Final    GRAM NEGATIVE RODS IN BOTH AEROBIC AND ANAEROBIC BOTTLES CRITICAL VALUE NOTED.  VALUE IS CONSISTENT WITH PREVIOUSLY REPORTED AND CALLED VALUE. GRAM STAIN REVIEWED-AGREE WITH RESULT DRT Performed at [facility], [address] [place], [place] [address]    Culture GRAM NEGATIVE RODS  Final   Report Status PENDING  Incomplete  Resp panel by RT-PCR (RSV, Flu A&B, Covid) Anterior Nasal Swab     Status: None   Collection Time: 10/30/23  8:01 PM   Specimen: Anterior Nasal Swab  Result Value Ref Range Status   SARS Coronavirus 2 by RT PCR NEGATIVE NEGATIVE Final    Comment: (NOTE) SARS-CoV-2 target nucleic acids are NOT DETECTED.  The SARS-CoV-2 RNA is generally detectable in upper respiratory specimens during the acute phase of infection. The lowest concentration of SARS-CoV-2 viral copies this assay can detect is 138 copies/mL. A negative result does not preclude SARS-Cov-2 infection and should not be used as the sole basis for treatment or other patient management decisions. A negative result may occur with  improper specimen collection/handling, submission of specimen other than nasopharyngeal swab, presence of viral mutation(s) within the areas targeted by this assay, and inadequate number of viral copies(<138 copies/mL). A negative result must be combined with clinical observations, patient history, and epidemiological information. The expected result is Negative.  Fact Sheet for Patients:  BloggerCourse.com  Fact Sheet for Healthcare Providers:  SeriousBroker.it  This test is no t yet approved or cleared by the [place]  FDA and  has been authorized for detection and/or diagnosis of SARS-CoV-2 by FDA under an Emergency Use Authorization (EUA). This EUA will remain  in effect (meaning this test can be used) for the  duration of the COVID-19 declaration under Section 564(b)(1) of the Act, 21 U.S.C.section 360bbb-3(b)(1), unless the authorization is terminated  or revoked sooner.       Influenza A by PCR NEGATIVE NEGATIVE Final   Influenza B by PCR NEGATIVE NEGATIVE Final    Comment: (NOTE) The Xpert Xpress SARS-CoV-2/FLU/RSV plus assay is intended as an aid in the diagnosis of influenza from Nasopharyngeal swab specimens and should not be used as a sole basis for treatment. Nasal washings and aspirates are unacceptable for Xpert Xpress SARS-CoV-2/FLU/RSV testing.  Fact Sheet for Patients: BloggerCourse.com  Fact Sheet for Healthcare Providers: SeriousBroker.it  This test is not yet approved or cleared by the [place]  FDA and has been authorized for detection and/or diagnosis of SARS-CoV-2 by FDA under an Emergency Use Authorization (EUA). This EUA will remain in effect (meaning this test can be used) for the duration of the COVID-19 declaration under Section 564(b)(1) of the Act, 21 U.S.C. section 360bbb-3(b)(1), unless the authorization is terminated or revoked.     Resp Syncytial Virus by PCR NEGATIVE NEGATIVE Final    Comment: (NOTE) Fact Sheet for Patients: BloggerCourse.com  Fact Sheet for Healthcare Providers: SeriousBroker.it  This test is not yet approved or cleared by the [place]  FDA and has been authorized for detection and/or diagnosis of SARS-CoV-2 by FDA under an Emergency Use Authorization (EUA). This EUA will remain in effect (meaning this test can be used) for the duration of the COVID-19 declaration under Section 564(b)(1) of the Act, 21 U.S.C. section 360bbb-3(b)(1), unless the authorization is terminated or revoked.  Performed at [facility], [address] [place], [place] [address]     Coagulation Studies: Recent Labs    10/30/23 1928  10/31/23 0610  LABPROT 17.4* 21.2*  INR 1.3* 1.7*    Urinalysis: No results for input(s): COLORURINE, LABSPEC, PHURINE, GLUCOSEU, HGBUR, BILIRUBINUR, KETONESUR, PROTEINUR, UROBILINOGEN, NITRITE, LEUKOCYTESUR in the last 72 hours.  Invalid input(s): APPERANCEUR    Imaging: DG Chest Portable 1 View Result Date: 10/30/2023 CLINICAL DATA:  Short of breath, lower extremity swelling, weakness EXAM: PORTABLE CHEST 1 VIEW COMPARISON:  09/22/2023 FINDINGS: Single frontal view of the chest demonstrates an unremarkable cardiac silhouette. Stable emphysema and background scarring. No acute airspace disease, effusion, or pneumothorax. No acute bony abnormalities. IMPRESSION: 1. Emphysema and background scarring unchanged since prior study. No acute airspace disease. Electronically Signed   By: Ozell Daring M.D.   On: 10/30/2023 20:36     Assessment & Plan: Tricia [name] is a 63 y.o.  female with past medical history including COPD, type 2 diabetes, anxiety, depression, GERD and hypertension, who was admitted to [facility] on 10/30/2023 for Hyperkalemia [E87.5] Hyponatremia [E87.1] Weakness [R53.1] Leg swelling [M79.89] Wound infection [T14.8XXA, L08.9] AKI (acute kidney injury) (HCC) [N17.9] Sepsis (HCC) [A41.9] Sepsis due to cellulitis (HCC) [L03.90, A41.9] Sepsis, due to unspecified organism, unspecified whether acute organ dysfunction present (HCC) [A41.9]  Acute kidney injury with hyperkalemia/hyponatremia likely secondary to multiple factors: infectious process, hypotension and volume depletion. Normal renal function noted last month. Found down by son. CK 58. Will order renal ultrasound to evaluate obstruction. Agree with IV hydration.  Potassium 6.0, treated with shifting measures. Sodium 128 on admission, slowly correcting with hydration.  Continue to avoid nephrotoxic agents, therapies, and further hypotension. No immediate need for dialysis but maintain a low  threshold for CRRT.  2. Acute metabolic acidosis, S bicarb 13. Agree with IVF to supplement. If no improvement, can consider CRRT  3. Anemia of kidney injury. Normocytic Lab Results  Component Value Date   HGB 9.6 (L) 10/31/2023    Hgb decreased.Will monitor for now.   4. Sepsis secondary to cellulitis, bilateral lower extremities. Primary team has ordered Cefepime  and Vancomycin    LOS: 1 Bernardo Brayman 9/11/20253:19 PM

## 2023-10-31 NOTE — ED Notes (Signed)
 MD made aware of MAP of 62 at this time.

## 2023-10-31 NOTE — Progress Notes (Addendum)
 PROGRESS NOTE  Tricia [name]    DOB: Jul 08, 1960, 63 y.o.  FMW:[id number]    Code Status: Full Code   DOA: 10/30/2023   LOS: 1   Brief hospital course  Tricia [name] is a 63 y.o. female with a PMH significant for anxiety, osteoarthritis, asthma, COPD, depression, type 2 diabetes mellitus, GERD, dyslipidemia, and hypertension, who presented to the emergency room with acute onset of generalized weakness and bilateral lower extremity swelling more on the left.  The patient has been on the ground apparently for several hours before her son found her.  She denies presyncope or syncope but just felt too weak to get up and walk and was unfortunately covered in urine and feces.  She was noted to have a left leg wound with purulent drainage in the right leg ulcer with surrounding erythema and induration.  She denied any chest pain or palpitations.  She has been having dyspnea without cough or wheezing.  She admitted to fever and chills.  She denied any nausea or vomiting or abdominal pain.  No dysuria, oliguria or hematuria or flank pain.   ED Course: When the patient came to the ER, BP was 109/53 with heart rate of 114 and later BP was 96/52 and temperature was 99.8, respiratory rate was 22. Labs reveal hyponatremia 128 and hyperkalemia of 6 with a CO2 of 13 and glucose of 126, BUN of 42 and creatinine 3.64 calcium  of 7.8 and albumin  2 with total protein 5.7.  BNP was 78.5 and high-sensitivity troponin I was 14.  Lactic acid was 2.6 and CBC showed hemoglobin 11.8 and hematocrit 36.9.  Respiratory panel came back negative.  Blood cultures were drawn. EKG: sinus tachycardia with rate 118 with poor R wave progression. Imaging: Portable chest x-ray showed emphysema with no acute cardiopulmonary disease. The patient was given IV vancomycin  and  IV cefepime .   10/31/23 -patient appears critically ill without significant improvement from initial resuscitative efforts. Contacting Ccm to  consult  Assessment & Plan  Principal Problem:   Sepsis due to cellulitis [facility]) Active Problems:   AKI (acute kidney injury) (HCC)   Hyperkalemia   Asthma, chronic   Hyponatremia   Parkinson's disease (HCC)   GERD without esophagitis   Dyslipidemia   Gout  Septic shock- hypotensive and lethargic despite fluid resuscitation. Has been upgraded to stepdown unit. Consulted ICU team.  Sepsis due to cellulitis of lower extremities(HCC)- manifested by tachycardia and hypotension, elevated LA, renal failure. See clinical image for further detail.  Serratia bacteremia- BCID positive.  - Continue antibiotic therapy with IV cefepime  - Will follow blood cultures and wound Gram stain culture and sensitivity. - Wound care consult - appreciate CCM input - continue IVF.    AKI  Acute renal failure- Cr significantly increased from baseline normal to 3.64 on presentation.  - Will follow BMP. - Will avoid for toxins. - nephrology has been consulted, appreciate your care  Diabetes- diet controlled. A1c 5.1.  Hypoglycemic event this am without symptoms. Ampule given x3 this admission. Discontinued her sliding scale insulin  that was ordered on admission - dextrose  containing fluids ordered   Hyponatremia  Hyperkalemia- unable to tolerate PO meds at this time. Can consider albuterol , insulin . Nephrology has been consulted if urgent dialysis is needed.  - This was aggressively managed and will be followed. - It is likely secondary to AKI. - continue to monitor. Calcium  gluconate if EKG abnormalities  - follow BMP   Asthma, chronic - continue theophylline , Singulair ,  and his bronchodilator inhaler.   GERD without esophagitis -continue PPI therapy.   Parkinson's disease (HCC) - continue Sinemet  CR and Sinemet  IR.   Dyslipidemia - ontinue statin therapy.   Gout - continue colchicine .  Body mass index is 23.57 kg/m.  VTE ppx: enoxaparin  (LOVENOX ) injection 30 mg Start: 10/30/23  2200  Diet:     Diet   Diet heart healthy/carb modified Room service appropriate? Yes; Fluid consistency: Thin   Consultants: CCM Nephrology   Subjective 10/31/23    Pt reports nothing. She is alert to voice but falls asleep easily. Unable to answer questions at this time.    Objective  Blood pressure (!) 95/50, pulse (!) 109, temperature 99.5 F (37.5 C), temperature source Axillary, resp. rate 14, height 5' 8 (1.727 m), weight 70.3 kg, SpO2 100%.  Intake/Output Summary (Last 24 hours) at 10/31/2023 0741 Last data filed at 10/31/2023 0101 Gross per 24 hour  Intake 1150.04 ml  Output --  Net 1150.04 ml   Filed Weights   10/30/23 1925  Weight: 70.3 kg    Physical Exam:  General: arrousable to voice. NAD Respiratory: normal respiratory effort. CTAB Cardiovascular: normal S1/S2, RRR, no JVD, murmurs Gastrointestinal: soft, NT, ND Nervous: Alert momentarily to voice.  Extremities: significant erythema, superficial wounds, and pitting edema to bilateral lower extremities. Appears to cause distress when palpated. See clinical image for further detail.   Labs   I have personally reviewed the following labs and imaging studies CBC    Component Value Date/Time   WBC 7.7 10/31/2023 0610   RBC 3.00 (L) 10/31/2023 0610   HGB 9.6 (L) 10/31/2023 0610   HGB 13.7 11/16/2013 2040   HCT 28.9 (L) 10/31/2023 0610   HCT 40.9 11/16/2013 2040   PLT 173 10/31/2023 0610   PLT 254 11/16/2013 2040   MCV 96.3 10/31/2023 0610   MCV 94 11/16/2013 2040   MCH 32.0 10/31/2023 0610   MCHC 33.2 10/31/2023 0610   RDW 14.5 10/31/2023 0610   RDW 14.0 11/16/2013 2040   LYMPHSABS 0.8 10/30/2023 1928   LYMPHSABS 2.7 11/16/2013 2040   MONOABS 0.3 10/30/2023 1928   MONOABS 0.6 11/16/2013 2040   EOSABS 0.0 10/30/2023 1928   EOSABS 0.1 11/16/2013 2040   BASOSABS 0.0 10/30/2023 1928   BASOSABS 0.1 11/16/2013 2040   BASOSABS 0 11/15/2011 1430      Latest Ref Rng & Units 10/31/2023    6:10 AM  10/30/2023    7:28 PM 09/29/2023    5:09 AM  BMP  Glucose 70 - 99 mg/dL 62  873    BUN 8 - 23 mg/dL 40  42    Creatinine 9.55 - 1.00 mg/dL 6.70  6.35  9.04   Sodium 135 - 145 mmol/L 131  128    Potassium 3.5 - 5.1 mmol/L 5.2  6.0    Chloride 98 - 111 mmol/L 105  102    CO2 22 - 32 mmol/L 18  13    Calcium  8.9 - 10.3 mg/dL 7.2  7.8      DG Chest Portable 1 View Result Date: 10/30/2023 CLINICAL DATA:  Short of breath, lower extremity swelling, weakness EXAM: PORTABLE CHEST 1 VIEW COMPARISON:  09/22/2023 FINDINGS: Single frontal view of the chest demonstrates an unremarkable cardiac silhouette. Stable emphysema and background scarring. No acute airspace disease, effusion, or pneumothorax. No acute bony abnormalities. IMPRESSION: 1. Emphysema and background scarring unchanged since prior study. No acute airspace disease. Electronically Signed   By: Ozell  Delores M.D.   On: 10/30/2023 20:36    Disposition Plan & Communication  Patient status: Inpatient  Admitted From: Home Planned disposition location: TBD Anticipated discharge date: TBD pending clinical development   Family Communication: none at bedside    Author: Marien LITTIE Piety, DO [facility] 10/31/2023, 7:41 AM   Available by Epic secure chat 7AM-7PM. If 7PM-7AM, please contact night-coverage.  [facility] contact information found on [email or web address].

## 2023-10-31 NOTE — ED Notes (Addendum)
 Notified Dr. Cleatus of pt's low blood pressures. Awaiting further orders.

## 2023-11-01 ENCOUNTER — Inpatient Hospital Stay

## 2023-11-01 DIAGNOSIS — S81802A Unspecified open wound, left lower leg, initial encounter: Secondary | ICD-10-CM | POA: Diagnosis not present

## 2023-11-01 DIAGNOSIS — M79605 Pain in left leg: Secondary | ICD-10-CM | POA: Diagnosis not present

## 2023-11-01 DIAGNOSIS — M7989 Other specified soft tissue disorders: Secondary | ICD-10-CM | POA: Diagnosis not present

## 2023-11-01 DIAGNOSIS — M79604 Pain in right leg: Secondary | ICD-10-CM

## 2023-11-01 DIAGNOSIS — F17210 Nicotine dependence, cigarettes, uncomplicated: Secondary | ICD-10-CM

## 2023-11-01 DIAGNOSIS — W19XXXA Unspecified fall, initial encounter: Secondary | ICD-10-CM

## 2023-11-01 DIAGNOSIS — Z79899 Other long term (current) drug therapy: Secondary | ICD-10-CM

## 2023-11-01 DIAGNOSIS — Z7982 Long term (current) use of aspirin: Secondary | ICD-10-CM

## 2023-11-01 LAB — RENAL FUNCTION PANEL
Albumin: <1.5 g/dL — ABNORMAL LOW (ref 3.5–5.0)
Anion gap: 11 (ref 5–15)
BUN: 51 mg/dL — ABNORMAL HIGH (ref 8–23)
CO2: 18 mmol/L — ABNORMAL LOW (ref 22–32)
Calcium: 7.2 mg/dL — ABNORMAL LOW (ref 8.9–10.3)
Chloride: 102 mmol/L (ref 98–111)
Creatinine, Ser: 3.51 mg/dL — ABNORMAL HIGH (ref 0.44–1.00)
GFR, Estimated: 14 mL/min — ABNORMAL LOW (ref >60)
Glucose, Bld: 217 mg/dL — ABNORMAL HIGH (ref 70–99)
Phosphorus: 5.3 mg/dL — ABNORMAL HIGH (ref 2.5–4.6)
Potassium: 4.5 mmol/L (ref 3.5–5.1)
Sodium: 131 mmol/L — ABNORMAL LOW (ref 135–145)

## 2023-11-01 LAB — GLUCOSE, CAPILLARY
Glucose-Capillary: 230 mg/dL — ABNORMAL HIGH (ref 70–99)
Glucose-Capillary: 143 mg/dL — ABNORMAL HIGH (ref 70–99)
Glucose-Capillary: 117 mg/dL — ABNORMAL HIGH (ref 70–99)
Glucose-Capillary: 157 mg/dL — ABNORMAL HIGH (ref 70–99)
Glucose-Capillary: 159 mg/dL — ABNORMAL HIGH (ref 70–99)
Glucose-Capillary: 225 mg/dL — ABNORMAL HIGH (ref 70–99)
Glucose-Capillary: 181 mg/dL — ABNORMAL HIGH (ref 70–99)

## 2023-11-01 LAB — CBC
HCT: 27.4 % — ABNORMAL LOW (ref 36.0–46.0)
Hemoglobin: 9.4 g/dL — ABNORMAL LOW (ref 12.0–15.0)
MCH: 32.1 pg (ref 26.0–34.0)
MCHC: 34.3 g/dL (ref 30.0–36.0)
MCV: 93.5 fL (ref 80.0–100.0)
Platelets: 146 K/uL — ABNORMAL LOW (ref 150–400)
RBC: 2.93 MIL/uL — ABNORMAL LOW (ref 3.87–5.11)
RDW: 14.6 % (ref 11.5–15.5)
WBC: 15.6 K/uL — ABNORMAL HIGH (ref 4.0–10.5)
nRBC: 0 % (ref 0.0–0.2)

## 2023-11-01 LAB — MAGNESIUM: Magnesium: 1.6 mg/dL — ABNORMAL LOW (ref 1.7–2.4)

## 2023-11-01 LAB — URINALYSIS, ROUTINE W REFLEX MICROSCOPIC
Bilirubin Urine: NEGATIVE
Glucose, UA: NEGATIVE mg/dL
Hgb urine dipstick: NEGATIVE
Ketones, ur: NEGATIVE mg/dL
Nitrite: NEGATIVE
Protein, ur: NEGATIVE mg/dL
Specific Gravity, Urine: 1.011 (ref 1.005–1.030)
WBC, UA: >50 WBC/hpf (ref 0–5)
pH: 5 (ref 5.0–8.0)

## 2023-11-01 MED ORDER — ACETAMINOPHEN 325 MG PO TABS
650.0000 mg | ORAL_TABLET | Freq: Four times a day (QID) | ORAL | Status: DC | PRN
  Administered 2023-11-01 – 2023-11-15 (×4): 650 mg
  Filled 2023-11-01 (×6): qty 2

## 2023-11-01 MED ORDER — INSULIN ASPART 100 UNIT/ML IJ SOLN
0.0000 [IU] | INTRAMUSCULAR | Status: DC
  Administered 2023-11-01: 5 [IU] via SUBCUTANEOUS
  Administered 2023-11-01: 2 [IU] via SUBCUTANEOUS
  Administered 2023-11-01: 5 [IU] via SUBCUTANEOUS
  Administered 2023-11-01 – 2023-11-02 (×2): 2 [IU] via SUBCUTANEOUS
  Administered 2023-11-02 (×3): 3 [IU] via SUBCUTANEOUS
  Administered 2023-11-02 – 2023-11-03 (×3): 2 [IU] via SUBCUTANEOUS
  Filled 2023-11-01 (×11): qty 1

## 2023-11-01 MED ORDER — ADULT MULTIVITAMIN W/MINERALS CH
1.0000 | ORAL_TABLET | Freq: Every day | ORAL | Status: DC
  Administered 2023-11-01 – 2023-11-04 (×4): 1
  Filled 2023-11-01 (×4): qty 1

## 2023-11-01 MED ORDER — FREE WATER
30.0000 mL | Status: DC
  Administered 2023-11-01 – 2023-11-02 (×7): 30 mL

## 2023-11-01 MED ORDER — MAGNESIUM SULFATE 2 GM/50ML IV SOLN
2.0000 g | Freq: Once | INTRAVENOUS | Status: AC
  Administered 2023-11-01: 2 g via INTRAVENOUS
  Filled 2023-11-01: qty 50

## 2023-11-01 MED ORDER — MEDIHONEY WOUND/BURN DRESSING EX PSTE
1.0000 | PASTE | Freq: Every day | CUTANEOUS | Status: DC
  Administered 2023-11-01 – 2023-11-17 (×16): 1 via TOPICAL
  Filled 2023-11-01 (×2): qty 44

## 2023-11-01 MED ORDER — SODIUM CHLORIDE 0.9 % IV SOLN
500.0000 mg | Freq: Two times a day (BID) | INTRAVENOUS | Status: DC
  Administered 2023-11-01 – 2023-11-02 (×2): 500 mg via INTRAVENOUS
  Filled 2023-11-01: qty 10
  Filled 2023-11-01: qty 500

## 2023-11-01 MED ORDER — THIAMINE MONONITRATE 100 MG PO TABS
100.0000 mg | ORAL_TABLET | Freq: Every day | ORAL | Status: DC
  Administered 2023-11-01 – 2023-11-05 (×5): 100 mg
  Filled 2023-11-01 (×5): qty 1

## 2023-11-01 MED ORDER — VITAL AF 1.2 CAL PO LIQD
1000.0000 mL | ORAL | Status: DC
  Administered 2023-11-01: 1000 mL

## 2023-11-01 MED ORDER — MIDODRINE HCL 5 MG PO TABS
10.0000 mg | ORAL_TABLET | Freq: Three times a day (TID) | ORAL | Status: DC
  Administered 2023-11-01 – 2023-11-04 (×11): 10 mg via NASOGASTRIC
  Filled 2023-11-01 (×11): qty 2

## 2023-11-01 MED ORDER — BUDESONIDE 0.5 MG/2ML IN SUSP
0.5000 mg | Freq: Two times a day (BID) | RESPIRATORY_TRACT | Status: DC
  Administered 2023-11-01 – 2023-11-17 (×31): 0.5 mg via RESPIRATORY_TRACT
  Filled 2023-11-01 (×33): qty 2

## 2023-11-01 MED ORDER — SODIUM CHLORIDE 0.9 % IV SOLN
1.0000 g | Freq: Two times a day (BID) | INTRAVENOUS | Status: DC
  Filled 2023-11-01: qty 20

## 2023-11-01 MED ORDER — VITAL HP 1.0 CAL PO LIQD: 1000.0000 mL | ORAL | Status: DC

## 2023-11-01 MED ORDER — HEPARIN SODIUM (PORCINE) 5000 UNIT/ML IJ SOLN
5000.0000 [IU] | Freq: Three times a day (TID) | INTRAMUSCULAR | Status: DC
  Administered 2023-11-01 – 2023-11-02 (×4): 5000 [IU] via SUBCUTANEOUS
  Filled 2023-11-01 (×5): qty 1

## 2023-11-01 MED ORDER — ALBUMIN HUMAN 25 % IV SOLN
25.0000 g | Freq: Once | INTRAVENOUS | Status: AC
  Administered 2023-11-01: 25 g via INTRAVENOUS
  Filled 2023-11-01: qty 100

## 2023-11-01 NOTE — Progress Notes (Signed)
 Speech Language Pathology Treatment: Dysphagia  Patient Details Name: Tricia [name] MRN: [id number] DOB: [birth year] Today's Date: 11/01/2023 Time: 9166-9156 SLP Time Calculation (min) (ACUTE ONLY): 10 min  Assessment / Plan / Recommendation Clinical Impression  Pt seen for ongoing assessment of PO readiness. Pt is responsive to her name and to command to open her eyes. She does close them fairly quickly. Pt does state that she is thirsty. Pt able to open eyes and maintain brief visual attention to spoon with single ice chip. Pt with initial awareness of ice chip as evidenced by closing her mouth to contain it. However she then appears to lose attention as evidenced by her mouth falling open with ice chip resting in her right buccal area. With maximal verbal and tactile cues, pt able to close her mouth again but no swallow response seen or felt at neck. Pt's AMS continues to be a barrier to PO intake. Continue to recommend NPO with ST services to follow.     HPI HPI: Tricia [name] is a 63 y.o. female who presented to ED with acute onset of generalizated weakness and BLE swelling (L>R) after being found on the ground for several hours covered in urine and feces. Pt with a PMH significant for anxiety, osteoarthritis, asthma, COPD, depression, type 2 diabetes mellitus, GERD, dyslipidemia, and hypertension. CXR 1. Emphysema and background scarring unchanged since prior study. No  acute airspace disease.      SLP Plan  Continue with current plan of care          Recommendations  Diet recommendations: NPO Medication Administration: Via alternative means                  Oral care QID   Frequent or constant Supervision/Assistance Dysphagia, oropharyngeal phase (R13.12)     Continue with current plan of care   Sybilla Malhotra B. Rubbie, M.S., CCC-SLP, Tree surgeon Certified Brain Injury Specialist [facility]  [facility] Office [phone] Ascom [phone] Fax [phone]

## 2023-11-01 NOTE — Consult Note (Signed)
 Hospital Consult    Reason for Consult:  Bilateral lower extremity swelling Requesting Physician:  Dr Halina Picking Md  MRN #:  [id number]  History of Present Illness: This is a 63 y.o. female  with medical history significant for anxiety, osteoarthritis, asthma, COPD, depression, type 2 diabetes mellitus, GERD, dyslipidemia, and hypertension, who presented to the emergency room with acute onset of generalized weakness and bilateral lower extremity swelling more on the left.  The patient has been on the ground apparently for several hours before her son found her.  She denies presyncope or syncope but just felt too weak to get up and walk and was unfortunately covered in urine and feces.  She was noted to have a left leg wound with purulent drainage in the right leg ulcer with surrounding erythema and induration.   Upon exam this morning patient appeared to be uncomfortable and in pain.  As I examined her lower extremities which were edematous with hyperkeratotic skin she moaned and complained.  She did have a left leg wound but appeared to be draining and a right leg ulcer with surrounding erythema and induration.  She does have a history of falls at home with a long history of opioid use.  Vascular surgery was consulted to evaluate.  Past Medical History:  Diagnosis Date   Anxiety    Arthritis    joints and hands/ knees   Asthma    uses inhaler   Benign essential tremor    head   Cervical dystonia    neck pain   Cholesteatoma of left ear    x2   COPD (chronic obstructive pulmonary disease) (HCC)    Cough    Depression    Diabetes mellitus without complication (HCC)    type 2   Diastolic dysfunction    Dyspnea    Dysrhythmia    diastolic dysfunction   GERD (gastroesophageal reflux disease)    Headache    migraines/ one per week   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   Hyperlipidemia    Hypertension    Motion sickness    boat   Neuromuscular disorder (HCC)     neuropathy feet and hands( nerve damage)   Wears dentures    upper and lower    Past Surgical History:  Procedure Laterality Date   CARPAL TUNNEL RELEASE Bilateral    x2 right, 1x on left   COLONOSCOPY     COLONOSCOPY WITH PROPOFOL  N/A 04/25/2017   Procedure: COLONOSCOPY WITH PROPOFOL ;  Surgeon: Jinny Carmine, MD;  Location: [facility];  Service: Endoscopy;  Laterality: N/A;  diabetic-oral med   DILATION AND CURETTAGE OF UTERUS     ESOPHAGOGASTRODUODENOSCOPY (EGD) WITH PROPOFOL  N/A 01/10/2021   Procedure: ESOPHAGOGASTRODUODENOSCOPY (EGD) WITH PROPOFOL ;  Surgeon: Janalyn Keene NOVAK, MD;  Location: [facility];  Service: Endoscopy;  Laterality: N/A;  Diabetic   EXTERNAL EAR SURGERY Left    x2   POLYPECTOMY  04/25/2017   Procedure: POLYPECTOMY INTESTINAL;  Surgeon: Jinny Carmine, MD;  Location: [facility];  Service: Endoscopy;;   SPINE SURGERY     herniated disc   TUBAL LIGATION      Allergies  Allergen Reactions   Augmentin [Amoxicillin-Pot Clavulanate] Diarrhea   Penicillins Itching    Prior to Admission medications   Medication Sig Start Date End Date Taking? Authorizing Provider  ascorbic acid  (VITAMIN C ) 500 MG tablet Take 1 tablet (500 mg total) by mouth 2 (two) times daily.  09/27/23  Yes Marsa Edelman, DO  aspirin  EC 81 MG tablet Take 81 mg by mouth daily. Swallow whole.   Yes [provider]  calcium  carbonate (TUMS - DOSED IN MG ELEMENTAL CALCIUM ) 500 MG chewable tablet Chew 2 tablets (400 mg of elemental calcium  total) by mouth 3 (three) times daily as needed for indigestion or heartburn. 09/30/23  Yes Alexander, Natalie, DO  carbidopa -levodopa  (SINEMET  CR) 50-200 MG tablet Take 1 tablet by mouth at bedtime. 07/26/23 07/25/24 Yes [provider]  carbidopa -levodopa  (SINEMET  IR) 25-100 MG tablet Take 1.5 tablets by mouth 3 (three) times daily. 01/25/20 10/30/23 Yes Maree Jannett POUR, MD  colchicine  0.6 MG tablet Take 1 tablet (0.6 mg total)  by mouth 2 (two) times daily as needed for up to 3 days (gout flare). 09/27/23 10/30/23 Yes Alexander, Natalie, DO  cyclobenzaprine  (FLEXERIL ) 10 MG tablet Take 10 mg by mouth at bedtime. 08/01/21  Yes [provider]  diphenoxylate -atropine  (LOMOTIL ) 2.5-0.025 MG tablet Take 2 tablets by mouth 4 (four) times daily as needed for diarrhea or loose stools. 09/27/23  Yes Alexander, Natalie, DO  DULoxetine  (CYMBALTA ) 60 MG capsule Take 1 capsule (60 mg total) by mouth daily. 05/01/23  Yes Sowles, Krichna, MD  empagliflozin  (JARDIANCE ) 25 MG TABS tablet Take 1 tablet (25 mg total) by mouth daily before breakfast. 05/01/23  Yes Sowles, Krichna, MD  furosemide  (LASIX ) 40 MG tablet Take 1 tablet (40 mg total) by mouth daily as needed. 06/21/17  Yes [name], Felicha E, NP  leptospermum manuka honey (MEDIHONEY) PSTE paste Apply 1 Application topically daily. Apply to sacral wound Apply thin layer (3 mm) to wound. 09/28/23  Yes Alexander, Natalie, DO  levocetirizine (XYZAL ) 5 MG tablet Take 1 tablet (5 mg total) by mouth every evening. 05/01/23  Yes Sowles, Krichna, MD  midodrine  (PROAMATINE ) 5 MG tablet Take 1 tablet (5 mg total) by mouth 3 (three) times daily with meals. 09/27/23  Yes Alexander, Natalie, DO  montelukast  (SINGULAIR ) 10 MG tablet Take 1 tablet by mouth at bedtime. 02/21/21  Yes Fleming, Herbon E, MD  Multiple Vitamin (MULTIVITAMIN WITH MINERALS) TABS tablet Take 1 tablet by mouth daily. 09/28/23  Yes Alexander, Natalie, DO  omeprazole  (PRILOSEC) 40 MG capsule Take 1 capsule (40 mg total) by mouth daily. 05/01/23  Yes Sowles, Krichna, MD  oxyCODONE  (OXY IR/ROXICODONE ) 5 MG immediate release tablet Take 1 tablet (5 mg total) by mouth every 4 (four) hours as needed for moderate pain (pain score 4-6) or severe pain (pain score 7-10). 09/27/23  Yes Marsa Edelman, DO  QUEtiapine  (SEROQUEL ) 25 MG tablet Take 1 tablet (25 mg total) by mouth at bedtime. 05/01/23  Yes Sowles, Krichna, MD  rosuvastatin  (CRESTOR ) 5  MG tablet Take 1 tablet (5 mg total) by mouth at bedtime. 05/01/23  Yes Sowles, Krichna, MD  SYMBICORT  160-4.5 MCG/ACT inhaler INHALE 2 PUFFS TWICE A DAY RINSE MOUTH WITH WATER  AFTER EACH USE 07/09/22  Yes Sowles, Krichna, MD  theophylline  (UNIPHYL) 400 MG 24 hr tablet Take 1 tablet by mouth daily.  12/27/17  Yes Fleming, Herbon E, MD  tiotropium (SPIRIVA ) 18 MCG inhalation capsule Place 1 capsule into inhaler and inhale daily. pm 08/27/13  Yes Theotis Lavelle BRAVO, MD  VENTOLIN  HFA 108 (90 Base) MCG/ACT inhaler INHALE 1 PUFF BY MOUTH AS NEEDED 04/26/20  Yes Sowles, Krichna, MD  zinc  sulfate, 50mg  elemental zinc , 220 (50 Zn) MG capsule Take 1 capsule (220 mg total) by mouth daily. 09/28/23  Yes Alexander, Natalie, DO  ipratropium-albuterol  (DUONEB) 0.5-2.5 (3) MG/3ML SOLN Inhale 3 mLs into the lungs every 6 (six) hours as needed. 06/21/17   [name], Almarie BRAVO, NP    Social History   Socioeconomic History   Marital status: Widowed    Spouse name: Not on file   Number of children: 2   Years of education: Not on file   Highest education level: Associate degree: academic program  Occupational History   Occupation: Disability  Tobacco Use   Smoking status: Every Day    Current packs/day: 1.00    Average packs/day: 1 pack/day for 46.5 years (46.5 ttl pk-yrs)    Types: Cigarettes    Start date: 05/19/1977   Smokeless tobacco: Never  Vaping Use   Vaping status: Former  Substance and Sexual Activity   Alcohol use: No    Alcohol/week: 0.0 standard drinks of alcohol   Drug use: No   Sexual activity: Not Currently    Birth control/protection: None  Other Topics Concern   Not on file  Social History Narrative   Living with her daughter now    Social Drivers of Health   Financial Resource Strain: Patient Declined (06/18/2023)   Received from [facility]   Overall Financial Resource Strain (CARDIA)    Difficulty of Paying Living Expenses: Patient declined  Food Insecurity: Patient  Unable To Answer (10/31/2023)   Hunger Vital Sign    Worried About Running Out of Food in the Last Year: Patient unable to answer    Ran Out of Food in the Last Year: Patient unable to answer  Transportation Needs: Patient Unable To Answer (10/31/2023)   PRAPARE - Transportation    Lack of Transportation (Medical): Patient unable to answer    Lack of Transportation (Non-Medical): Patient unable to answer  Physical Activity: Sufficiently Active (03/07/2023)   Exercise Vital Sign    Days of Exercise per Week: 7 days    Minutes of Exercise per Session: 30 min  Stress: No Stress Concern Present (03/07/2023)   [employer] of Occupational Health - Occupational Stress Questionnaire    Feeling of Stress : Only a little  Social Connections: Socially Isolated (08/29/2023)   Social Connection and Isolation Panel    Frequency of Communication with Friends and Family: More than three times a week    Frequency of Social Gatherings with Friends and Family: Never    Attends Religious Services: Never    Database administrator or Organizations: No    Attends Banker Meetings: Never    Marital Status: Divorced  Catering manager Violence: Patient Unable To Answer (10/31/2023)   Humiliation, Afraid, Rape, and Kick questionnaire    Fear of Current or Ex-Partner: Patient unable to answer    Emotionally Abused: Patient unable to answer    Physically Abused: Patient unable to answer    Sexually Abused: Patient unable to answer     Family History  Problem Relation Age of Onset   Emphysema Mother    Anxiety disorder Mother    Stroke Father    Throat cancer Father    Lung cancer Maternal Grandmother    Lung cancer Maternal Grandfather    Hypertension Daughter    Diabetes Daughter    Multiple sclerosis Daughter    Bipolar disorder Daughter    Cervical cancer Daughter    Bipolar disorder Daughter    Drug abuse Daughter    Lung cancer Maternal Aunt    Lung cancer Maternal Uncle  ROS: Otherwise negative unless mentioned in HPI  Physical Examination  Vitals:   11/01/23 0746 11/01/23 0800  BP: 125/66 128/65  Pulse:  (!) 108  Resp: 11 13  Temp:  97.9 F (36.6 C)  SpO2:  93%   Body mass index is 21.52 kg/m.  General:  WDWN in NAD Gait: Not observed HENT: WNL, normocephalic Pulmonary: normal non-labored breathing, without Rales, rhonchi,  wheezing Cardiac: regular, without  Murmurs, rubs or gallops; without carotid bruits Abdomen: Positive bowel sounds throughout, soft, NT/ND, no masses Skin: without rashes Vascular Exam/Pulses: Strong doppler Popliteal, DP/PT pulses in lower extremities despite the edema. Extremities are warm to touch.   Extremities: without ischemic changes, without Gangrene , without cellulitis; without open wounds;  Musculoskeletal: no muscle wasting or atrophy  Neurologic: A&O X 3;  No focal weakness or paresthesias are detected; speech is fluent/normal Psychiatric:  The pt has Normal affect. Lymph:  Unremarkable  CBC    Component Value Date/Time   WBC 15.6 (H) 11/01/2023 0352   RBC 2.93 (L) 11/01/2023 0352   HGB 9.4 (L) 11/01/2023 0352   HGB 13.7 11/16/2013 2040   HCT 27.4 (L) 11/01/2023 0352   HCT 40.9 11/16/2013 2040   PLT 146 (L) 11/01/2023 0352   PLT 254 11/16/2013 2040   MCV 93.5 11/01/2023 0352   MCV 94 11/16/2013 2040   MCH 32.1 11/01/2023 0352   MCHC 34.3 11/01/2023 0352   RDW 14.6 11/01/2023 0352   RDW 14.0 11/16/2013 2040   LYMPHSABS 0.8 10/30/2023 1928   LYMPHSABS 2.7 11/16/2013 2040   MONOABS 0.3 10/30/2023 1928   MONOABS 0.6 11/16/2013 2040   EOSABS 0.0 10/30/2023 1928   EOSABS 0.1 11/16/2013 2040   BASOSABS 0.0 10/30/2023 1928   BASOSABS 0.1 11/16/2013 2040   BASOSABS 0 11/15/2011 1430    BMET    Component Value Date/Time   NA 131 (L) 11/01/2023 0352   NA 146 (H) 07/06/2015 0858   NA 131 (L) 11/16/2013 2040   K 4.5 11/01/2023 0352   K 4.0 11/16/2013 2040   CL 102 11/01/2023 0352   CL 95  (L) 11/16/2013 2040   CO2 18 (L) 11/01/2023 0352   CO2 26 11/16/2013 2040   GLUCOSE 217 (H) 11/01/2023 0352   GLUCOSE 277 (H) 11/16/2013 2040   BUN 51 (H) 11/01/2023 0352   BUN 9 07/06/2015 0858   BUN 7 11/16/2013 2040   CREATININE 3.51 (H) 11/01/2023 0352   CREATININE 1.26 (H) 11/01/2022 1048   CALCIUM  7.2 (L) 11/01/2023 0352   CALCIUM  7.9 (L) 11/16/2013 2040   GFRNONAA 14 (L) 11/01/2023 0352   GFRNONAA 76 08/05/2019 1504   GFRAA 88 08/05/2019 1504    COAGS: Lab Results  Component Value Date   INR 1.7 (H) 10/31/2023   INR 1.3 (H) 10/30/2023   INR 1.4 (H) 09/22/2023     Non-Invasive Vascular Imaging:   Ordered bilateral lower extremity ABI's  Statin:  Yes.   Beta Blocker:  No. Aspirin :  Yes.   ACEI:  No. ARB:  No. CCB use:  No Other antiplatelets/anticoagulants:  No.    ASSESSMENT/PLAN: This is a 63 y.o. female who presented to [facility] emergency room with acute onset of generalized weakness and bilateral lower extremity swelling greater on the left versus right.  The patient was apparently found on the ground after being here for several hours by her son.  She denied syncope at that time but felt like she was too weak to get up and  walk.  She has a left lower extremity wound with purulent drainage.  She was seen previously in the emergency room for that and sent home last week.  She states that was from the fall as well.  After examining the patient's lower extremities today it appears patient has very strong bilateral lower extremity popliteal, DP/PT pulses and warm extremities.  I do not believe that her lower extremity wounds are caused by vascular ischemia or venous insufficiency as much as it may be from her falls.  I recommend at this time that we do bilateral lower extremity ABIs to rule out any arterial ischemia.  However the patient may not be able to tolerate this as she endorses extreme pain to her lower extremities.  I would also recommend lightly wrapping both of  her bilateral lower extremities with Kerlix and Coban.  I would also recommend consulting the wound care nurse for a Monday visit to assess the previous wounds.  We can also have her follow-up as outpatient for further investigation of her bilateral lower extremity swelling.  As we can do outpatient ABIs at that time if she feels better.   -I discussed the case with Dr. Cordella Shawl MD and he agrees with the plan.   Gwendlyn JONELLE [facility]ank [facility] 11/01/2023 10:25 AM

## 2023-11-01 NOTE — Progress Notes (Signed)
 PHARMACY NOTE:  ANTIMICROBIAL RENAL DOSAGE ADJUSTMENT  Current antimicrobial regimen includes a mismatch between antimicrobial dosage and estimated renal function.  As per policy approved by the Pharmacy & Therapeutics and Medical Executive Committees, the antimicrobial dosage will be adjusted accordingly.  Current antimicrobial dosage:  meropenem  1g IV every 8 hours  Indication:  Bacteremia    Renal Function:  Estimated Creatinine Clearance: 16.5 mL/min (A) (by C-G formula based on SCr of 3.51 mg/dL (H)).     Antimicrobial dosage has been changed to:  Meropenem  1g IV every 12 hours  Additional comments:   Thank you for allowing pharmacy to be a part of this patient's care.  Estill CHRISTELLA Lutes, PharmD, BCPS Clinical Pharmacist 11/01/2023 10:17 AM

## 2023-11-01 NOTE — Progress Notes (Signed)
 Initial Nutrition Assessment  DOCUMENTATION CODES:   Severe malnutrition in context of chronic illness  INTERVENTION:   -RD will follow for ability to advance to PO diet and adjust TF regimen/ supplement orders as appropriate  -TF via NGT:  Initiate Vital AF 1.2 @ 25 ml/hr and increase by 10 ml every 8 hours to goal rate of 65 ml/hr.   30 ml free water  flush every 4 hours  Tube feeding regimen provides 1872 kcal (100% of needs), 117 grams of protein, and 1265 ml of H2O.  Total free water : 1445 ml daily  -MVI with minerals daily via tube -100 mg thiamine  daily x 7 days via tube -Monitor Mg, K, and Phos daily and replete as appropriate secondary to high refeeding risk  NUTRITION DIAGNOSIS:   Severe Malnutrition related to chronic illness (COPD) as evidenced by moderate fat depletion, severe fat depletion, moderate muscle depletion, severe muscle depletion, percent weight loss.  GOAL:   Patient will meet greater than or equal to 90% of their needs  MONITOR:   Diet advancement, TF tolerance  REASON FOR ASSESSMENT:   Consult Assessment of nutrition requirement/status, Enteral/tube feeding initiation and management  ASSESSMENT:   Pt with medical history significant for anxiety, osteoarthritis, asthma, COPD, depression, type 2 diabetes mellitus, GERD, dyslipidemia, and hypertension, who presented to the with acute onset of generalized weakness and bilateral lower extremity swelling more on the left.  Pt admitted with sepsis secondary to cellulitis and AKI.   9/11- s/p BSE- NPO 9/12- s/p BSE- NPO, NGT placed- KUB confirmed side port and tip of tube in stomach  Reviewed I/O's: +698 ml x 24 hours and +1.8 L since admission  UOP: 1.4 L x 24 hours  Per H&P, pt was on the ground for several hours PTA on day of admission prior to son finding her (she was covered in urine and feces and unable to get up herself).   Per [facility] notes, pt with full thickness to trauma wounds to  bilateral lower legs and DPTI to sacrum.   Nephrology following; no acute indication for HD now.   Case discussed with RN prior to visit, who reports NGT has just been placed. Pt NPO per SLP eval earlier today. Pt's respiratory has improved since this AM.   Spoke with pt at bedside, who was able to answer close ended questions, however, unsure of accuracy of history. Pt is familiar to this RD due to multiple prior admissions. Pt has had a general decline in health in the past few months and pt appears significantly more frail and depleted from RD recollection from last hospitalization. Pt shares that she thinks everything was going well at home and denies weakness or falls. Pt denies any weight loss, stating she was eating well and gaining weight PTA (I went from 145# to 152# since I last left the hospital). RD suspect this is in accurate as pt was 67.1 kg on 09/22/23 when this RD last assessed her. Exam has also significantly worsened since prior exam on 09/17/23.  Spoke with pt regarding plan of care. She recalls SLP visiting her. Discussed that she is unable to swallow safely at this time per SLP assessment. Pt complains of being hungry and thirsty. Discussed importance of NGT to provide food, fluids, and medications until she is able to swallow safely and consistently be able to demonstrate adequate oral intake.   Reviewed wt hx; pt has experienced a 10.5% wt loss over the past 6 months, which is significant for  time frame.   Case discussed with pharmacy and MD. Pt is at high refeeding risk. Pharmacy confirmed plans for electrolyte management. Refeeding labs have been ordered.   Labs reviewed: Na: 131, Phos: 5.3, Mg: 1.6, CBGS: 117-230 (inpatient orders for glycemic control are 0-15 units insulin  aspart every 4 hours).    NUTRITION - FOCUSED PHYSICAL EXAM:  Flowsheet Row Most Recent Value  Orbital Region Severe depletion  Upper Arm Region Severe depletion  Thoracic and Lumbar Region  Moderate depletion  Buccal Region Severe depletion  Temple Region Severe depletion  Clavicle Bone Region Severe depletion  Clavicle and Acromion Bone Region Severe depletion  Scapular Bone Region Severe depletion  Dorsal Hand Severe depletion  Patellar Region Severe depletion  Anterior Thigh Region Severe depletion  Posterior Calf Region Severe depletion  Edema (RD Assessment) Mild  Hair Reviewed  Eyes Reviewed  Mouth Reviewed  Skin Reviewed  Nails Reviewed    Diet Order:   Diet Order             Diet NPO time specified  Diet effective now                   EDUCATION NEEDS:   Education needs have been addressed  Skin:  Skin Assessment: Skin Integrity Issues: Skin Integrity Issues:: Other (Comment), DTI DTI: sacrum Other: full thickness to trauma wounds to bilateral lower legs  Last BM:  10/31/23 (type 6)  Height:   Ht Readings from Last 1 Encounters:  10/31/23 5' 8 (1.727 m)    Weight:   Wt Readings from Last 1 Encounters:  10/31/23 64.2 kg    Ideal Body Weight:  63.6 kg  BMI:  Body mass index is 21.52 kg/m.  Estimated Nutritional Needs:   Kcal:  1850-2150  Protein:  105-120 grams  Fluid:  1.9-2.1 L    Margery ORN, RD, LDN, CDCES Registered Dietitian III Certified Diabetes Care and Education Specialist If unable to reach this RD, please use RD Inpatient group chat on secure chat between hours of 8am-4 pm daily

## 2023-11-01 NOTE — Plan of Care (Signed)

## 2023-11-01 NOTE — Progress Notes (Signed)
 Updated pt's son-in-law Ozell Coffer via telephone on clinical status and current plan of care.  All questions answered, he is very appreciative of update.     Inge Lecher, AGACNP-BC [facility] Prefer epic messenger for cross cover needs If after hours, please call E-link

## 2023-11-01 NOTE — Progress Notes (Signed)
 [facility]  ROUNDING NOTE   Subjective:   Patient seen laying in bed in ICU Drowsy but alert for short periods.  Denies pain Remains on Levo 3L  Foley catheter-1L urine output IVF Sodium bicarb  Objective:  Vital signs in last 24 hours:  Temp:  [97.3 F (36.3 C)-98.8 F (37.1 C)] 98.8 F (37.1 C) (09/12 1200) Pulse Rate:  [25-111] 111 (09/12 1000) Resp:  [8-23] 11 (09/12 1200) BP: (76-128)/(45-72) 105/53 (09/12 1200) SpO2:  [49 %-100 %] 96 % (09/12 1200) Weight:  [64.2 kg] 64.2 kg (09/11 1346)  Weight change: -6.107 kg Filed Weights   10/30/23 1925 10/31/23 1346  Weight: 70.3 kg 64.2 kg    Intake/Output: I/O last 3 completed shifts: In: 3273.3 [I.V.:1530.5; IV Piggyback:1742.8] Out: 1425 [Urine:1425]   Intake/Output this shift:  Total I/O In: -  Out: 175 [Urine:175]  Physical Exam: General: Ill appearing  Head: Normocephalic, atraumatic. Dry oral mucosal membranes  Eyes: Anicteric  Neck: Supple  Lungs:  Clear to auscultation  Heart: Regular rate and rhythm  Abdomen:  Soft, nontender  Extremities:  + peripheral edema.  Neurologic: Awake, alert, conversant  Skin: BLE erythema        Basic Metabolic Panel: Recent Labs  Lab 10/30/23 1928 10/31/23 0610 10/31/23 1632 10/31/23 1938 11/01/23 0352  NA 128* 131* 129* 129* 131*  K 6.0* 5.2* 5.0 4.7 4.5  CL 102 105 102 102 102  CO2 13* 18* 16* 17* 18*  GLUCOSE 126* 62* 182* 206* 217*  BUN 42* 40* 47* 47* 51*  CREATININE 3.64* 3.29* 3.47* 3.28* 3.51*  CALCIUM  7.8* 7.2* 7.0* 7.0* 7.2*  MG  --   --   --   --  1.6*  PHOS  --   --   --   --  5.3*    Liver Function Tests: Recent Labs  Lab 10/30/23 1928 11/01/23 0352  AST 25  --   ALT 9  --   ALKPHOS 123  --   BILITOT 1.2  --   PROT 5.7*  --   ALBUMIN  2.0* <1.5*   No results for input(s): LIPASE, AMYLASE in the last 168 hours. No results for input(s): AMMONIA in the last 168 hours.  CBC: Recent Labs  Lab 10/30/23 1928  10/31/23 0610 10/31/23 1629 11/01/23 0352  WBC 10.1 7.7 17.1* 15.6*  NEUTROABS 8.9*  --   --   --   HGB 11.8* 9.6* 9.8* 9.4*  HCT 36.9 28.9* 29.7* 27.4*  MCV 97.4 96.3 96.4 93.5  PLT 367 173 184 146*    Cardiac Enzymes: Recent Labs  Lab 10/30/23 2213  CKTOTAL 38    BNP: Invalid input(s): POCBNP  CBG: Recent Labs  Lab 10/31/23 1941 10/31/23 2328 11/01/23 0358 11/01/23 0735 11/01/23 1113  GLUCAP 188* 185* 230* 143* 117*    Microbiology: Results for orders placed or performed during the hospital encounter of 10/30/23  Blood culture (routine x 2)     Status: Abnormal (Preliminary result)   Collection Time: 10/30/23  7:28 PM   Specimen: BLOOD  Result Value Ref Range Status   Specimen Description   Final    BLOOD LEFT ANTECUBITAL Performed at [facility], [address], [place], [place] [address]    Special Requests   Final    BLOOD Blood Culture adequate volume Performed at [facility], [address], [place], [place] [address]    Culture  Setup Time   Final    GRAM NEGATIVE RODS  IN BOTH AEROBIC AND ANAEROBIC BOTTLES CRITICAL RESULT CALLED TO, READ BACK BY AND VERIFIED WITH: SHEEMA HALLAJI PHARMD [id number] 10/31/23 HNM GRAM STAIN REVIEWED-AGREE WITH RESULT DRT    Culture (A)  Final    SERRATIA MARCESCENS SUSCEPTIBILITIES TO FOLLOW Performed at [facility], [address], [place], [place] [address]    Report Status PENDING  Incomplete  Blood Culture ID Panel (Reflexed)     Status: Abnormal   Collection Time: 10/30/23  7:28 PM  Result Value Ref Range Status   Enterococcus faecalis NOT DETECTED NOT DETECTED Final   Enterococcus Faecium NOT DETECTED NOT DETECTED Final   Listeria monocytogenes NOT DETECTED NOT DETECTED Final   Staphylococcus species NOT DETECTED NOT DETECTED Final   Staphylococcus aureus (BCID) NOT DETECTED NOT DETECTED Final   Staphylococcus epidermidis NOT DETECTED NOT DETECTED Final   Staphylococcus lugdunensis  NOT DETECTED NOT DETECTED Final   Streptococcus species NOT DETECTED NOT DETECTED Final   Streptococcus agalactiae NOT DETECTED NOT DETECTED Final   Streptococcus pneumoniae NOT DETECTED NOT DETECTED Final   Streptococcus pyogenes NOT DETECTED NOT DETECTED Final   A.calcoaceticus-baumannii NOT DETECTED NOT DETECTED Final   Bacteroides fragilis NOT DETECTED NOT DETECTED Final   Enterobacterales DETECTED (A) NOT DETECTED Final    Comment: Enterobacterales represent a large order of gram negative bacteria, not a single organism. CRITICAL RESULT CALLED TO, READ BACK BY AND VERIFIED WITH: SHEEMA HALLAJI PHARMD [id number] 10/31/23 HNM    Enterobacter cloacae complex NOT DETECTED NOT DETECTED Final   Escherichia coli NOT DETECTED NOT DETECTED Final   Klebsiella aerogenes NOT DETECTED NOT DETECTED Final   Klebsiella oxytoca NOT DETECTED NOT DETECTED Final   Klebsiella pneumoniae NOT DETECTED NOT DETECTED Final   Proteus species NOT DETECTED NOT DETECTED Final   Salmonella species NOT DETECTED NOT DETECTED Final   Serratia marcescens DETECTED (A) NOT DETECTED Final    Comment: CRITICAL RESULT CALLED TO, READ BACK BY AND VERIFIED WITH: ESTILL LUTES PHARMD [id number] 10/31/23 HNM    Haemophilus influenzae NOT DETECTED NOT DETECTED Final   Neisseria meningitidis NOT DETECTED NOT DETECTED Final   Pseudomonas aeruginosa NOT DETECTED NOT DETECTED Final   Stenotrophomonas maltophilia NOT DETECTED NOT DETECTED Final   Candida albicans NOT DETECTED NOT DETECTED Final   Candida auris NOT DETECTED NOT DETECTED Final   Candida glabrata NOT DETECTED NOT DETECTED Final   Candida krusei NOT DETECTED NOT DETECTED Final   Candida parapsilosis NOT DETECTED NOT DETECTED Final   Candida tropicalis NOT DETECTED NOT DETECTED Final   Cryptococcus neoformans/gattii NOT DETECTED NOT DETECTED Final   CTX-M ESBL NOT DETECTED NOT DETECTED Final   Carbapenem resistance IMP NOT DETECTED NOT DETECTED Final   Carbapenem resistance  KPC NOT DETECTED NOT DETECTED Final   Carbapenem resistance NDM NOT DETECTED NOT DETECTED Final   Carbapenem resist OXA 48 LIKE NOT DETECTED NOT DETECTED Final   Carbapenem resistance VIM NOT DETECTED NOT DETECTED Final    Comment: Performed at [facility], [address] [place], [place] [address]  Blood culture (routine x 2)     Status: None (Preliminary result)   Collection Time: 10/30/23  7:55 PM   Specimen: BLOOD  Result Value Ref Range Status   Specimen Description   Final    BLOOD BLOOD RIGHT ARM Performed at [facility], [address] [place], [place] [address]    Special Requests   Final    BOTTLES DRAWN AEROBIC AND ANAEROBIC Blood Culture results may not  be optimal due to an inadequate volume of blood received in culture bottles Performed at [facility], [address] [place], [place] [address]    Culture  Setup Time   Final    GRAM NEGATIVE RODS IN BOTH AEROBIC AND ANAEROBIC BOTTLES CRITICAL VALUE NOTED.  VALUE IS CONSISTENT WITH PREVIOUSLY REPORTED AND CALLED VALUE. GRAM STAIN REVIEWED-AGREE WITH RESULT DRT    Culture   Final    GRAM NEGATIVE RODS IDENTIFICATION TO FOLLOW Performed at [facility], [address] [place], [place] [address]    Report Status PENDING  Incomplete  Resp panel by RT-PCR (RSV, Flu A&B, Covid) Anterior Nasal Swab     Status: None   Collection Time: 10/30/23  8:01 PM   Specimen: Anterior Nasal Swab  Result Value Ref Range Status   SARS Coronavirus 2 by RT PCR NEGATIVE NEGATIVE Final    Comment: (NOTE) SARS-CoV-2 target nucleic acids are NOT DETECTED.  The SARS-CoV-2 RNA is generally detectable in upper respiratory specimens during the acute phase of infection. The lowest concentration of SARS-CoV-2 viral copies this assay can detect is 138 copies/mL. A negative result does not preclude SARS-Cov-2 infection and should not be used as the sole basis for treatment or other patient management  decisions. A negative result may occur with  improper specimen collection/handling, submission of specimen other than nasopharyngeal swab, presence of viral mutation(s) within the areas targeted by this assay, and inadequate number of viral copies(<138 copies/mL). A negative result must be combined with clinical observations, patient history, and epidemiological information. The expected result is Negative.  Fact Sheet for Patients:  BloggerCourse.com  Fact Sheet for Healthcare Providers:  SeriousBroker.it  This test is no t yet approved or cleared by the [place]  FDA and  has been authorized for detection and/or diagnosis of SARS-CoV-2 by FDA under an Emergency Use Authorization (EUA). This EUA will remain  in effect (meaning this test can be used) for the duration of the COVID-19 declaration under Section 564(b)(1) of the Act, 21 U.S.C.section 360bbb-3(b)(1), unless the authorization is terminated  or revoked sooner.       Influenza A by PCR NEGATIVE NEGATIVE Final   Influenza B by PCR NEGATIVE NEGATIVE Final    Comment: (NOTE) The Xpert Xpress SARS-CoV-2/FLU/RSV plus assay is intended as an aid in the diagnosis of influenza from Nasopharyngeal swab specimens and should not be used as a sole basis for treatment. Nasal washings and aspirates are unacceptable for Xpert Xpress SARS-CoV-2/FLU/RSV testing.  Fact Sheet for Patients: BloggerCourse.com  Fact Sheet for Healthcare Providers: SeriousBroker.it  This test is not yet approved or cleared by the [place]  FDA and has been authorized for detection and/or diagnosis of SARS-CoV-2 by FDA under an Emergency Use Authorization (EUA). This EUA will remain in effect (meaning this test can be used) for the duration of the COVID-19 declaration under Section 564(b)(1) of the Act, 21 U.S.C. section 360bbb-3(b)(1), unless the  authorization is terminated or revoked.     Resp Syncytial Virus by PCR NEGATIVE NEGATIVE Final    Comment: (NOTE) Fact Sheet for Patients: BloggerCourse.com  Fact Sheet for Healthcare Providers: SeriousBroker.it  This test is not yet approved or cleared by the [place]  FDA and has been authorized for detection and/or diagnosis of SARS-CoV-2 by FDA under an Emergency Use Authorization (EUA). This EUA will remain in effect (meaning this test can be used) for the duration of the COVID-19 declaration under Section 564(b)(1) of the Act, 21 U.S.C.  section 360bbb-3(b)(1), unless the authorization is terminated or revoked.  Performed at [facility], [address] [place], [place] [address]   MRSA Next Gen by PCR, Nasal     Status: None   Collection Time: 10/31/23  2:05 PM   Specimen: Nasal Mucosa; Nasal Swab  Result Value Ref Range Status   MRSA by PCR Next Gen NOT DETECTED NOT DETECTED Final    Comment: (NOTE) The GeneXpert MRSA Assay (FDA approved for NASAL specimens only), is one component of a comprehensive MRSA colonization surveillance program. It is not intended to diagnose MRSA infection nor to guide or monitor treatment for MRSA infections. Test performance is not FDA approved in patients less than 40 years old. Performed at [facility], [address] [place], [place] [address]     Coagulation Studies: Recent Labs    10/30/23 1928 10/31/23 0610  LABPROT 17.4* 21.2*  INR 1.3* 1.7*    Urinalysis: Recent Labs    11/01/23 0050  COLORURINE YELLOW*  LABSPEC 1.011  PHURINE 5.0  GLUCOSEU NEGATIVE  HGBUR NEGATIVE  BILIRUBINUR NEGATIVE  KETONESUR NEGATIVE  PROTEINUR NEGATIVE  NITRITE NEGATIVE  LEUKOCYTESUR MODERATE*      Imaging: DG Abd 1 View Result Date: 11/01/2023 CLINICAL DATA:  Nasogastric tube placement. EXAM: ABDOMEN - 1 VIEW COMPARISON:  Abdomen and pelvis CT dated  07/06/2021. FINDINGS: Normal bowel-gas pattern. Nasogastric tube tip in the proximal to mid stomach and side hole in the proximal stomach. Mild scoliosis and mild lumbar spine degenerative changes. Extensive atheromatous arterial calcifications without visible aneurysm. IMPRESSION: Nasogastric tube tip in the proximal to mid stomach. Electronically Signed   By: Elspeth Bathe M.D.   On: 11/01/2023 12:50   US  Venous Img Lower Bilateral (DVT) Result Date: 11/01/2023 CLINICAL DATA:  Bilateral lower extremity edema EXAM: BILATERAL LOWER EXTREMITY VENOUS DOPPLER ULTRASOUND TECHNIQUE: Gray-scale sonography with graded compression, as well as color Doppler and duplex ultrasound were performed to evaluate the lower extremity deep venous systems from the level of the common femoral vein and including the common femoral, femoral, profunda femoral, popliteal and calf veins including the posterior tibial, peroneal and gastrocnemius veins when visible. The superficial great saphenous vein was also interrogated. Spectral Doppler was utilized to evaluate flow at rest and with distal augmentation maneuvers in the common femoral, femoral and popliteal veins. COMPARISON:  None Available. FINDINGS: RIGHT LOWER EXTREMITY Common Femoral Vein: No evidence of thrombus. Normal compressibility, respiratory phasicity and response to augmentation. Saphenofemoral Junction: No evidence of thrombus. Normal compressibility and flow on color Doppler imaging. Profunda Femoral Vein: No evidence of thrombus. Normal compressibility and flow on color Doppler imaging. Femoral Vein: No evidence of thrombus. Normal compressibility, respiratory phasicity and response to augmentation. Popliteal Vein: No evidence of thrombus. Normal compressibility, respiratory phasicity and response to augmentation. Calf Veins: No evidence of thrombus. Normal compressibility and flow on color Doppler imaging. Superficial Great Saphenous Vein: No evidence of thrombus.  Normal compressibility. Venous Reflux:  None. Other Findings:  Extensive subcutaneous edema. LEFT LOWER EXTREMITY Common Femoral Vein: Unable to visualize due to obscuration from overlying dressing. Saphenofemoral Junction: Unable to visualize due to obscuration from overlying dressing. Profunda Femoral Vein: No evidence of thrombus. Normal compressibility and flow on color Doppler imaging. Femoral Vein: No evidence of thrombus. Normal compressibility, respiratory phasicity and response to augmentation. Popliteal Vein: No evidence of thrombus. Normal compressibility, respiratory phasicity and response to augmentation. Calf Veins: No evidence of thrombus. Normal compressibility and flow on color Doppler imaging. Superficial  Great Saphenous Vein: No evidence of thrombus. Normal compressibility. Venous Reflux:  None. Other Findings:  Extensive subcutaneous edema. IMPRESSION: 1. No evidence of deep venous thrombosis in either lower extremity. Please note that the left common femoral vein was not well seen due to overlying bandaging material. 2. Extensive subcutaneous edema bilaterally. Differential considerations include volume overload versus cellulitis. 3. Enlarged right superficial inguinal lymph node measuring up to 1.2 cm in short axis. Given known history of right lower extremity infection, reactive lymphadenopathy is strongly favored. Electronically Signed   By: Wilkie Lent M.D.   On: 11/01/2023 08:54   CT TIBIA FIBULA RIGHT WO CONTRAST Result Date: 10/31/2023 CLINICAL DATA:  Swelling.  Osteomyelitis suspected. EXAM: CT OF THE LOWER RIGHT EXTREMITY WITHOUT CONTRAST TECHNIQUE: Multidetector CT imaging of the right lower extremity was performed according to the standard protocol. RADIATION DOSE REDUCTION: This exam was performed according to the departmental dose-optimization program which includes automated exposure control, adjustment of the mA and/or kV according to patient size and/or use of iterative  reconstruction technique. COMPARISON:  None Available. FINDINGS: Bones/Joint/Cartilage No acute bony abnormality. Specifically, no fracture, subluxation, or dislocation. No bone destruction to suggest osteomyelitis. Ligaments Suboptimally assessed by CT. Muscles and Tendons Negative Soft tissues Stranding throughout the subcutaneous soft tissues in the left lower extremity. IMPRESSION: Diffuse soft tissue swelling. No acute bony abnormality. No evidence of osteomyelitis. Electronically Signed   By: Franky Crease M.D.   On: 10/31/2023 21:21   CT FOOT RIGHT WO CONTRAST Result Date: 10/31/2023 CLINICAL DATA:  Swelling.  Osteomyelitis suspected. EXAM: CT OF THE LOWER RIGHT EXTREMITY WITHOUT CONTRAST TECHNIQUE: Multidetector CT imaging of the right lower extremity was performed according to the standard protocol. RADIATION DOSE REDUCTION: This exam was performed according to the departmental dose-optimization program which includes automated exposure control, adjustment of the mA and/or kV according to patient size and/or use of iterative reconstruction technique. COMPARISON:  None Available. FINDINGS: Bones/Joint/Cartilage No acute bony abnormality. Specifically, no fracture, subluxation, or dislocation. No bone destruction to suggest osteomyelitis. Ligaments Suboptimally assessed by CT. Muscles and Tendons Negative Soft tissues Stranding throughout the subcutaneous soft tissues in the left lower extremity. IMPRESSION: Diffuse soft tissue swelling. No acute bony abnormality. No evidence of osteomyelitis. Electronically Signed   By: Franky Crease M.D.   On: 10/31/2023 21:21   CT TIBIA FIBULA LEFT WO CONTRAST Result Date: 10/31/2023 CLINICAL DATA:  Swelling.  Osteomyelitis suspected. EXAM: CT OF THE LOWER LEFT EXTREMITY WITHOUT CONTRAST TECHNIQUE: Multidetector CT imaging of the lower left extremity was performed according to the standard protocol. RADIATION DOSE REDUCTION: This exam was performed according to the  departmental dose-optimization program which includes automated exposure control, adjustment of the mA and/or kV according to patient size and/or use of iterative reconstruction technique. COMPARISON:  None Available. FINDINGS: Bones/Joint/Cartilage No acute bony abnormality. Specifically, no fracture, subluxation, or dislocation. No bone destruction to suggest osteomyelitis. Ligaments Suboptimally assessed by CT. Muscles and Tendons Negative Soft tissues Stranding throughout the subcutaneous soft tissues in the left lower extremity. IMPRESSION: Diffuse soft tissue swelling. No acute bony abnormality. No evidence of osteomyelitis. Electronically Signed   By: Franky Crease M.D.   On: 10/31/2023 21:20   CT FOOT LEFT WO CONTRAST Result Date: 10/31/2023 CLINICAL DATA:  Swelling.  Osteomyelitis suspected. EXAM: CT OF THE LOWER LEFT EXTREMITY WITHOUT CONTRAST TECHNIQUE: Multidetector CT imaging of the lower left extremity was performed according to the standard protocol. RADIATION DOSE REDUCTION: This exam was performed according to the departmental  dose-optimization program which includes automated exposure control, adjustment of the mA and/or kV according to patient size and/or use of iterative reconstruction technique. COMPARISON:  None Available. FINDINGS: Bones/Joint/Cartilage No acute bony abnormality. Specifically, no fracture, subluxation, or dislocation. No bone destruction to suggest osteomyelitis. Ligaments Suboptimally assessed by CT. Muscles and Tendons Negative Soft tissues Stranding throughout the subcutaneous soft tissues in the left lower extremity. IMPRESSION: Diffuse soft tissue swelling. No acute bony abnormality. No evidence of osteomyelitis. Electronically Signed   By: Franky Crease M.D.   On: 10/31/2023 21:20   DG Chest Portable 1 View Result Date: 10/30/2023 CLINICAL DATA:  Short of breath, lower extremity swelling, weakness EXAM: PORTABLE CHEST 1 VIEW COMPARISON:  09/22/2023 FINDINGS: Single  frontal view of the chest demonstrates an unremarkable cardiac silhouette. Stable emphysema and background scarring. No acute airspace disease, effusion, or pneumothorax. No acute bony abnormalities. IMPRESSION: 1. Emphysema and background scarring unchanged since prior study. No acute airspace disease. Electronically Signed   By: Ozell Daring M.D.   On: 10/30/2023 20:36     Medications:    meropenem  (MERREM ) IV     norepinephrine  (LEVOPHED ) Adult infusion 5 mcg/min (11/01/23 0700)   sodium bicarbonate  150 mEq in dextrose  5 % 1,150 mL infusion 100 mL/hr at 11/01/23 0700    budesonide  (PULMICORT ) nebulizer solution  0.5 mg Nebulization BID   Chlorhexidine  Gluconate Cloth  6 each Topical Daily   heparin  injection (subcutaneous)  5,000 Units Subcutaneous Q8H   insulin  aspart  0-15 Units Subcutaneous Q4H   ipratropium-albuterol   3 mL Nebulization Q4H   leptospermum manuka honey  1 Application Topical Daily   lidocaine   1 patch Transdermal Q24H   midodrine   10 mg Per NG tube TID WC   pantoprazole  (PROTONIX ) IV  40 mg Intravenous Q24H   acetaminophen , [DISCONTINUED] insulin  aspart **AND** dextrose  **AND** POCT CBG monitoring, ipratropium-albuterol , [DISCONTINUED] ondansetron  **OR** ondansetron  (ZOFRAN ) IV  Assessment/ Plan:  [name] is a 63 y.o.  female with past medical history including COPD, type 2 diabetes, anxiety, depression, GERD and hypertension, who was admitted to [facility] on 10/30/2023 for Hyperkalemia [E87.5] Hyponatremia [E87.1] Weakness [R53.1] Leg swelling [M79.89] Wound infection [T14.8XXA, L08.9] AKI (acute kidney injury) (HCC) [N17.9] Sepsis (HCC) [A41.9] Sepsis due to cellulitis (HCC) [L03.90, A41.9] Sepsis, due to unspecified organism, unspecified whether acute organ dysfunction present (HCC) [A41.9]   Acute kidney injury with hyperkalemia/hyponatremia likely secondary to multiple factors: infectious process, hypotension and volume depletion. Normal  renal function noted last month. Found down by son. CK 58. Renal US  to evaluate obstruction. Creatinine rose again today. Foley catheter placed, adequate urine output achieved. Continue IVF and blood support. No acute indication for dialysis.    Lab Results  Component Value Date   CREATININE 3.51 (H) 11/01/2023   CREATININE 3.28 (H) 10/31/2023   CREATININE 3.47 (H) 10/31/2023    Intake/Output Summary (Last 24 hours) at 11/01/2023 1320 Last data filed at 11/01/2023 1200 Gross per 24 hour  Intake 2123.22 ml  Output 1600 ml  Net 523.22 ml   2. Acute metabolic acidosis, S bicarb 13.  Slowly improving with IVF.   3. Anemia of chronic kidney disease Lab Results  Component Value Date   HGB 9.4 (L) 11/01/2023    Hgb acceptable for this patient.   4. Sepsis secondary to cellulitis, bilateral lower extremities. Primary team ordered Cefepime  and Vancomycin . Now receiving meropenem .    LOS: 2 Ivelisse Culverhouse 9/12/20251:20 PM

## 2023-11-01 NOTE — Progress Notes (Signed)
 NAME:  Tricia [name], MRN:  [id number], DOB:  06-28-1960, LOS: 2 ADMISSION DATE:  10/30/2023, CONSULTATION DATE:  10/31/2023 REFERRING MD:  Dr. Lenon, CHIEF COMPLAINT:  Hypotension    Brief Pt Description / Synopsis:  63 y.o. female with PMHx most significant for COPD on home oxygen  admitted with Severe Sepsis due to Serratia Bacteremia secondary to severe Cellulitis of bilateral lower extremities, along with AKI and Anion Gap Metabolic Acidosis.  Course complicated by development of Septic Shock requiring vasopressors and Acute COPD Exacerbation.  History of Present Illness:  Tricia [name] is a 63 year old female with a past medical history significant for COPD requiring home supplemental oxygen , asthma, hypertension, type 2 diabetes mellitus, dyslipidemia, GERD who presented to [facility] ED on 10/30/2023 due to acute onset of generalized weakness and bilateral lower extremity swelling (left greater than right).  She reported she had been on the ground for several hours before her son found her.  She denied syncopal episode or hitting her head, but was just too weak to get up off the ground.  She was found covered in urine and feces.  She was also noted to have left leg wound with purulent drainage and right leg ulcer with surrounding erythema and induration.  She also endorsed shortness of breath and fever/chills.   She denies chest pain, palpitations, cough, sputum production, wheezing, abdominal pain, dysuria, nausea, vomiting. 11/01/23- S/P vascular surgery evaluation with no findings that require surgery at this time with adequate perfusion of lower extremities bilaterally. Her levophed  req has been weaned to 3mcg/kg/hr.  She received OGT and is able to start nourishment.    Past Medical History:  Diagnosis Date   Anxiety    Arthritis    joints and hands/ knees   Asthma    uses inhaler   Benign essential tremor    head   Cervical dystonia    neck pain   Cholesteatoma of left  ear    x2   COPD (chronic obstructive pulmonary disease) (HCC)    Cough    Depression    Diabetes mellitus without complication (HCC)    type 2   Diastolic dysfunction    Dyspnea    Dysrhythmia    diastolic dysfunction   GERD (gastroesophageal reflux disease)    Headache    migraines/ one per week   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   Hyperlipidemia    Hypertension    Motion sickness    boat   Neuromuscular disorder (HCC)    neuropathy feet and hands( nerve damage)   Wears dentures    upper and lower    Micro Data:  9/10: COVID/FLU/RSV PCR>> negative  9/10: Blood cultures (4/4 bottles)>> Serratia Marcescens   Antimicrobials:   Anti-infectives (From admission, onward)    Start     Dose/Rate Route Frequency Ordered Stop   10/31/23 2100  ceFEPIme  (MAXIPIME ) 2 g in sodium chloride  0.9 % 100 mL IVPB        2 g 200 mL/hr over 30 Minutes Intravenous Every 24 hours 10/30/23 2140     10/30/23 2218  vancomycin  variable dose per unstable renal function (pharmacist dosing)  Status:  Discontinued         Does not apply See admin instructions 10/30/23 2218 10/31/23 0909   10/30/23 2145  vancomycin  (VANCOREADY) IVPB 750 mg/150 mL        750 mg 150 mL/hr over 60 Minutes Intravenous  Once 10/30/23 2136 10/31/23 0101  10/30/23 2130  vancomycin  (VANCOCIN ) IVPB 1000 mg/200 mL premix  Status:  Discontinued        1,000 mg 200 mL/hr over 60 Minutes Intravenous  Once 10/30/23 2127 10/30/23 2133   10/30/23 2130  ceFEPIme  (MAXIPIME ) 2 g in sodium chloride  0.9 % 100 mL IVPB  Status:  Discontinued        2 g 200 mL/hr over 30 Minutes Intravenous  Once 10/30/23 2127 10/30/23 2140   10/30/23 2030  ceFEPIme  (MAXIPIME ) 2 g in sodium chloride  0.9 % 100 mL IVPB        2 g 200 mL/hr over 30 Minutes Intravenous  Once 10/30/23 2023 10/30/23 2205   10/30/23 2015  metroNIDAZOLE  (FLAGYL ) IVPB 500 mg        500 mg 100 mL/hr over 60 Minutes Intravenous  Once 10/30/23 2000 10/30/23 2330    10/30/23 2000  cefTRIAXone  (ROCEPHIN ) 2 g in sodium chloride  0.9 % 100 mL IVPB  Status:  Discontinued        2 g 200 mL/hr over 30 Minutes Intravenous Once 10/30/23 1953 10/30/23 2000   10/30/23 2000  vancomycin  (VANCOCIN ) IVPB 1000 mg/200 mL premix        1,000 mg 200 mL/hr over 60 Minutes Intravenous  Once 10/30/23 1953 10/30/23 2103       Significant Hospital Events: Including procedures, antibiotic start and stop dates in addition to other pertinent events   9/10: Admitted by [facility] for sepsis due to bilateral LE cellulitis and AKI.   9/11: Pt becoming more hypotensive concerning for possible developing septic shock, PCCM consulted in case were to need vasopressors.  Nephrology consulted for AKI, started on Bicarb gtt.  Left femoral CVC placed due to limited peripheral IV access and vasopressor needs.  Interim History / Subjective:  As outlined above under Significant Hospital Events section  Objective   Blood pressure 128/65, pulse (!) 108, temperature 97.9 F (36.6 C), temperature source Oral, resp. rate 13, height 5' 8 (1.727 m), weight 64.2 kg, SpO2 93%.        Intake/Output Summary (Last 24 hours) at 11/01/2023 1011 Last data filed at 11/01/2023 0700 Gross per 24 hour  Intake 2123.22 ml  Output 1425 ml  Net 698.22 ml   Filed Weights   10/30/23 1925 10/31/23 1346  Weight: 70.3 kg 64.2 kg    Examination: General: Acute on chronically frail appearing elderly female, sitting in bed, on room air, no acute distress HENT: Atraumatic, normocephalic, neck supple, no JVD Lungs: Mild expiratory wheezing throughout, even, nonlabored, normal effort Cardiovascular: Regular rate and rhythm (sinus rhythm on telemetry), S1-S2, no murmurs, rubs, gallops Abdomen: Soft, slightly tender to palpation, nondistended, no guarding or rebound tenderness, bowel sounds positive x 4 Extremities: Severe cellulitis to bilateral lower extremities with wounds to left lateral shin and right shin  (see images below), normal bulk and tone, no deformities Neuro: Awake and alert, oriented x 4, moves all extremities commands, no focal deficits noted, speech clear, pupils PERRLA GU: Deferred     Resolved Hospital Problem list     Assessment & Plan:   #1 Shock: Septic - bacteremia - serratia - present on admission - source is blood              Merem now IV dcd cefepime  due to encephalopathy   #2 bilateral LE cellulitis Chronic HFpEF         On abx  , imaging done with no finding of nec fasc or osteomyelitis  S/p vasc surg eval - no ischemia   PMHx: Dyslipidemia, Hypotension   Echocardiogram 09/13/23: LVEF 60-65%, grade I Diastolic dysfunction, RV systolic function normal, RV size normal -Continuous cardiac monitoring -Maintain MAP >65 -Gentle IV fluids -Vasopressors as needed to maintain MAP goal -Continue home midodrine   -Trend lactic acid until normalized -Consider repeat Echocardiogram  -Diuresis as BP and renal function permits ~ holding due to shock and AKI  #Acute Kidney Injury- KDIGO 4 - due to septic nephropathy             S/p nephrology evaluation - made >1L urine , will hold off dialysis  #Anion Gap Metabolic Acidosis #Mild Hyperkalemia #Mild Hyponatremia  -Monitor I&O's / urinary output -Follow BMP -Ensure adequate renal perfusion -Avoid nephrotoxic agents as able -Replace electrolytes as indicated ~ Pharmacy following for assistance with electrolyte replacement -Start Bicarb gtt -Consult Nephrology, appreciate input   #Acute on Chronic Hypoxic Respiratory Failure #Acute COPD Exacerbation  PMHx: COPD requiring home oxygen  -Supplemental O2 as needed to maintain O2 sats 88 to 92% -BiPAP if needed -Follow intermittent Chest X-ray & ABG as needed -Bronchodilators  -IV Steroids -ABX as above -Pulmonary toilet as able    Pt is critically ill with multiorgan failure. Prognosis is guarded, high risk for further decompensation, cardiac arrest, and  death.  Given current critical illness superimposed on multiple chronic co-morbidities and advanced age, overall long term prognosis is poor.  Recommend consideration of DNR/DNI status.  Will consult Palliative Care to assist with GOC discussions.   Best Practice (right click and Reselect all SmartList Selections daily)   Diet/type: Regular consistency (see orders) DVT prophylaxis: LMWH GI prophylaxis: PPI Lines: Left femoral CVC placed 9/11 Foley:  N/A Code Status:  full code Last date of multidisciplinary goals of care discussion [N/A]    Labs   CBC: Recent Labs  Lab 10/30/23 1928 10/31/23 0610 10/31/23 1629 11/01/23 0352  WBC 10.1 7.7 17.1* 15.6*  NEUTROABS 8.9*  --   --   --   HGB 11.8* 9.6* 9.8* 9.4*  HCT 36.9 28.9* 29.7* 27.4*  MCV 97.4 96.3 96.4 93.5  PLT 367 173 184 146*    Basic Metabolic Panel: Recent Labs  Lab 10/30/23 1928 10/31/23 0610 10/31/23 1632 10/31/23 1938 11/01/23 0352  NA 128* 131* 129* 129* 131*  K 6.0* 5.2* 5.0 4.7 4.5  CL 102 105 102 102 102  CO2 13* 18* 16* 17* 18*  GLUCOSE 126* 62* 182* 206* 217*  BUN 42* 40* 47* 47* 51*  CREATININE 3.64* 3.29* 3.47* 3.28* 3.51*  CALCIUM  7.8* 7.2* 7.0* 7.0* 7.2*  MG  --   --   --   --  1.6*  PHOS  --   --   --   --  5.3*   GFR: Estimated Creatinine Clearance: 16.5 mL/min (A) (by C-G formula based on SCr of 3.51 mg/dL (H)). Recent Labs  Lab 10/30/23 1928 10/30/23 2213 10/31/23 0610 10/31/23 1629 10/31/23 1930 11/01/23 0352  WBC 10.1  --  7.7 17.1*  --  15.6*  LATICACIDVEN 2.6* 2.5*  --  2.6* 1.8  --     Liver Function Tests: Recent Labs  Lab 10/30/23 1928 11/01/23 0352  AST 25  --   ALT 9  --   ALKPHOS 123  --   BILITOT 1.2  --   PROT 5.7*  --   ALBUMIN  2.0* <1.5*   No results for input(s): LIPASE, AMYLASE in the last 168 hours. No results for input(s): AMMONIA  in the last 168 hours.  ABG    Component Value Date/Time   HCO3 38.2 (H) 09/08/2021 1647   O2SAT 51.7  09/08/2021 1647     Coagulation Profile: Recent Labs  Lab 10/30/23 1928 10/31/23 0610  INR 1.3* 1.7*    Cardiac Enzymes: Recent Labs  Lab 10/30/23 2213  CKTOTAL 38    HbA1C: Hemoglobin A1C  Date/Time Value Ref Range Status  05/01/2023 02:51 PM 6.0 (A) 4.0 - 5.6 % Final  11/01/2022 10:09 AM 5.9 (A) 4.0 - 5.6 % Final  10/09/2012 04:52 AM 7.4 (H) 4.2 - 6.3 % Final    Comment:    The American Diabetes Association recommends that a primary goal of therapy should be <7% and that physicians should reevaluate the treatment regimen in patients with HbA1c values consistently >8%.   07/02/2012 05:33 AM 7.1 (H) 4.2 - 6.3 % Final    Comment:    The American Diabetes Association recommends that a primary goal of therapy should be <7% and that physicians should reevaluate the treatment regimen in patients with HbA1c values consistently >8%.    HbA1c, POC (controlled diabetic range)  Date/Time Value Ref Range Status  03/19/2018 02:04 PM 13.7 (A) 0.0 - 7.0 % Final  10/29/2017 02:18 PM 14.0 (A) 0.0 - 7.0 % Final    Comment:    >14.0   Hgb A1c MFr Bld  Date/Time Value Ref Range Status  10/30/2023 10:52 PM 5.1 4.8 - 5.6 % Final    Comment:    (NOTE) Diagnosis of Diabetes The following HbA1c ranges recommended by the American Diabetes Association (ADA) may be used as an aid in the diagnosis of diabetes mellitus.  Hemoglobin             Suggested A1C NGSP%              Diagnosis  <5.7                   Non Diabetic  5.7-6.4                Pre-Diabetic  >6.4                   Diabetic  <7.0                   Glycemic control for                       adults with diabetes.    06/05/2021 02:54 PM 6.6 (H) <5.7 % of total Hgb Final    Comment:    For someone without known diabetes, a hemoglobin A1c value of 6.5% or greater indicates that they may have  diabetes and this should be confirmed with a follow-up  test. . For someone with known diabetes, a value <7% indicates   that their diabetes is well controlled and a value  greater than or equal to 7% indicates suboptimal  control. A1c targets should be individualized based on  duration of diabetes, age, comorbid conditions, and  other considerations. . Currently, no consensus exists regarding use of hemoglobin A1c for diagnosis of diabetes for children. .     CBG: Recent Labs  Lab 10/31/23 1412 10/31/23 1941 10/31/23 2328 11/01/23 0358 11/01/23 0735  GLUCAP 77 188* 185* 230* 143*    Review of Systems:   Positives in BOLD: Gen: Denies fever, chills, weight change, fatigue, night sweats HEENT: Denies blurred vision, double vision, hearing loss,  tinnitus, sinus congestion, rhinorrhea, sore throat, neck stiffness, dysphagia PULM: Denies shortness of breath, cough, sputum production, hemoptysis, wheezing CV: Denies chest pain, edema, orthopnea, paroxysmal nocturnal dyspnea, palpitations GI: Denies abdominal pain, nausea, vomiting, diarrhea, hematochezia, melena, constipation, change in bowel habits GU: Denies dysuria, hematuria, polyuria, oliguria, urethral discharge Endocrine: Denies hot or cold intolerance, polyuria, polyphagia or appetite change Derm: Denies rash, dry skin, scaling or peeling skin change, cellulitis of bilateral lower extremities  Heme: Denies easy bruising, bleeding, bleeding gums Neuro: Denies headache, numbness, weakness, slurred speech, loss of memory or consciousness   Past Medical History:  She,  has a past medical history of Anxiety, Arthritis, Asthma, Benign essential tremor, Cervical dystonia, Cholesteatoma of left ear, COPD (chronic obstructive pulmonary disease) (HCC), Cough, Depression, Diabetes mellitus without complication (HCC), Diastolic dysfunction, Dyspnea, Dysrhythmia, GERD (gastroesophageal reflux disease), Headache, HOH (hard of hearing), Hyperlipidemia, Hypertension, Motion sickness, Neuromuscular disorder (HCC), and Wears dentures.   Surgical History:    Past Surgical History:  Procedure Laterality Date   CARPAL TUNNEL RELEASE Bilateral    x2 right, 1x on left   COLONOSCOPY     COLONOSCOPY WITH PROPOFOL  N/A 04/25/2017   Procedure: COLONOSCOPY WITH PROPOFOL ;  Surgeon: Jinny Carmine, MD;  Location: [facility];  Service: Endoscopy;  Laterality: N/A;  diabetic-oral med   DILATION AND CURETTAGE OF UTERUS     ESOPHAGOGASTRODUODENOSCOPY (EGD) WITH PROPOFOL  N/A 01/10/2021   Procedure: ESOPHAGOGASTRODUODENOSCOPY (EGD) WITH PROPOFOL ;  Surgeon: Janalyn Keene NOVAK, MD;  Location: [facility];  Service: Endoscopy;  Laterality: N/A;  Diabetic   EXTERNAL EAR SURGERY Left    x2   POLYPECTOMY  04/25/2017   Procedure: POLYPECTOMY INTESTINAL;  Surgeon: Jinny Carmine, MD;  Location: [facility];  Service: Endoscopy;;   SPINE SURGERY     herniated disc   TUBAL LIGATION       Social History:   reports that she has been smoking cigarettes. She started smoking about 46 years ago. She has a 46.5 pack-year smoking history. She has never used smokeless tobacco. She reports that she does not drink alcohol and does not use drugs.   Family History:  Her family history includes Anxiety disorder in her mother; Bipolar disorder in her daughter and daughter; Cervical cancer in her daughter; Diabetes in her daughter; Drug abuse in her daughter; Emphysema in her mother; Hypertension in her daughter; Lung cancer in her maternal aunt, maternal grandfather, maternal grandmother, and maternal uncle; Multiple sclerosis in her daughter; Stroke in her father; Throat cancer in her father.   Allergies Allergies  Allergen Reactions   Augmentin [Amoxicillin-Pot Clavulanate] Diarrhea   Penicillins Itching     Home Medications  Prior to Admission medications   Medication Sig Start Date End Date Taking? Authorizing Provider  ascorbic acid  (VITAMIN C ) 500 MG tablet Take 1 tablet (500 mg total) by mouth 2 (two) times daily. 09/27/23  Yes Alexander, Natalie, DO   aspirin  EC 81 MG tablet Take 81 mg by mouth daily. Swallow whole.   Yes [provider]  calcium  carbonate (TUMS - DOSED IN MG ELEMENTAL CALCIUM ) 500 MG chewable tablet Chew 2 tablets (400 mg of elemental calcium  total) by mouth 3 (three) times daily as needed for indigestion or heartburn. 09/30/23  Yes Alexander, Natalie, DO  carbidopa -levodopa  (SINEMET  CR) 50-200 MG tablet Take 1 tablet by mouth at bedtime. 07/26/23 07/25/24 Yes [provider]  carbidopa -levodopa  (SINEMET  IR) 25-100 MG tablet Take 1.5 tablets by mouth 3 (three) times daily. 01/25/20 10/30/23 Yes  Maree Jannett POUR, MD  colchicine  0.6 MG tablet Take 1 tablet (0.6 mg total) by mouth 2 (two) times daily as needed for up to 3 days (gout flare). 09/27/23 10/30/23 Yes Alexander, Natalie, DO  cyclobenzaprine  (FLEXERIL ) 10 MG tablet Take 10 mg by mouth at bedtime. 08/01/21  Yes [provider]  diphenoxylate -atropine  (LOMOTIL ) 2.5-0.025 MG tablet Take 2 tablets by mouth 4 (four) times daily as needed for diarrhea or loose stools. 09/27/23  Yes Alexander, Natalie, DO  DULoxetine  (CYMBALTA ) 60 MG capsule Take 1 capsule (60 mg total) by mouth daily. 05/01/23  Yes Sowles, Krichna, MD  empagliflozin  (JARDIANCE ) 25 MG TABS tablet Take 1 tablet (25 mg total) by mouth daily before breakfast. 05/01/23  Yes Sowles, Krichna, MD  furosemide  (LASIX ) 40 MG tablet Take 1 tablet (40 mg total) by mouth daily as needed. 06/21/17  Yes [name], Almarie BRAVO, NP  ipratropium-albuterol  (DUONEB) 0.5-2.5 (3) MG/3ML SOLN Inhale 3 mLs into the lungs every 6 (six) hours as needed. 06/21/17  Yes [name], Ellin E, NP  leptospermum manuka honey (MEDIHONEY) PSTE paste Apply 1 Application topically daily. Apply to sacral wound Apply thin layer (3 mm) to wound. 09/28/23  Yes Alexander, Natalie, DO  levocetirizine (XYZAL ) 5 MG tablet Take 1 tablet (5 mg total) by mouth every evening. 05/01/23  Yes Sowles, Krichna, MD  midodrine  (PROAMATINE ) 5 MG tablet Take 1 tablet (5  mg total) by mouth 3 (three) times daily with meals. 09/27/23  Yes Alexander, Natalie, DO  montelukast  (SINGULAIR ) 10 MG tablet Take 1 tablet by mouth at bedtime. 02/21/21  Yes Fleming, Herbon E, MD  Multiple Vitamin (MULTIVITAMIN WITH MINERALS) TABS tablet Take 1 tablet by mouth daily. 09/28/23  Yes Alexander, Natalie, DO  omeprazole  (PRILOSEC) 40 MG capsule Take 1 capsule (40 mg total) by mouth daily. 05/01/23  Yes Sowles, Krichna, MD  oxyCODONE  (OXY IR/ROXICODONE ) 5 MG immediate release tablet Take 1 tablet (5 mg total) by mouth every 4 (four) hours as needed for moderate pain (pain score 4-6) or severe pain (pain score 7-10). 09/27/23  Yes Alexander, Natalie, DO  QUEtiapine  (SEROQUEL ) 25 MG tablet Take 1 tablet (25 mg total) by mouth at bedtime. 05/01/23  Yes Sowles, Krichna, MD  rosuvastatin  (CRESTOR ) 5 MG tablet Take 1 tablet (5 mg total) by mouth at bedtime. 05/01/23  Yes Sowles, Krichna, MD  SYMBICORT  160-4.5 MCG/ACT inhaler INHALE 2 PUFFS TWICE A DAY RINSE MOUTH WITH WATER  AFTER EACH USE 07/09/22  Yes Sowles, Krichna, MD  theophylline  (UNIPHYL) 400 MG 24 hr tablet Take 1 tablet by mouth daily.  12/27/17  Yes Fleming, Herbon E, MD  tiotropium (SPIRIVA ) 18 MCG inhalation capsule Place 1 capsule into inhaler and inhale daily. pm 08/27/13  Yes Theotis Lavelle BRAVO, MD  VENTOLIN  HFA 108 (90 Base) MCG/ACT inhaler INHALE 1 PUFF BY MOUTH AS NEEDED 04/26/20  Yes Sowles, Krichna, MD  zinc  sulfate, 50mg  elemental zinc , 220 (50 Zn) MG capsule Take 1 capsule (220 mg total) by mouth daily. 09/28/23  Yes Marsa Edelman, DO     Critical care provider statement:   Total critical care time: 63 minutes   Performed by: Parris MD   Critical care time was exclusive of separately billable procedures and treating other patients.   Critical care was necessary to treat or prevent imminent or life-threatening deterioration.   Critical care was time spent personally by me on the following activities: development of treatment  plan with patient and/or surrogate as well as nursing, discussions with consultants, evaluation  of patient's response to treatment, examination of patient, obtaining history from patient or surrogate, ordering and performing treatments and interventions, ordering and review of laboratory studies, ordering and review of radiographic studies, pulse oximetry and re-evaluation of patient's condition.    Delorese Sellin, M.D.  Pulmonary & Critical Care Medicine

## 2023-11-01 NOTE — Consult Note (Signed)
 WOC Nurse Consult Note: patient seen in ER 10/23/2023 for L leg laceration at which time 10 steri strips were placed  Reason for Consult: leg wounds, DTPI sacrum  Wound type: 1.  Full thickness  B lower legs r/t trauma 2.  Deep Tissue Pressure Injury sacrum purple maroon discoloration that is evolving with scattered open red moist areas  Pressure Injury POA: Yes Measurement: see nursing flowsheet  Wound bed: L lateral leg wound 75% brown 25% red likely dead skin flap; R leg 100% tan dry; DTPI sacrum as above  Drainage (amount, consistency, odor) see nursing flowsheet  Periwound: legs with chronic appearing discoloration ? Related to venous insufficiency  Dressing procedure/placement/frequency: Cleanse sacrum with Vashe wound cleanser, do not rinse and allow to air dry. Apply Xeroform gauze Soila [phone]) to wound bed daily and secure with silicone foam.  Cleanse B lower leg wounds with Vashe wound cleanser, apply Medihoney to wound beds daily, cover with dry gauze and silicone foam.  POC discussed with bedside nurse. WOC team will not follow. Re-consult if further needs arise.   Thank you,    Powell Bar MSN, RN-BC, [employer]

## 2023-11-01 NOTE — Plan of Care (Signed)
  Problem: Clinical Measurements: Goal: Diagnostic test results will improve Outcome: Progressing   Problem: Respiratory: Goal: Ability to maintain adequate ventilation will improve Outcome: Progressing   Problem: Fluid Volume: Goal: Ability to maintain a balanced intake and output will improve Outcome: Progressing   Problem: Metabolic: Goal: Ability to maintain appropriate glucose levels will improve Outcome: Progressing   Problem: Elimination: Goal: Will not experience complications related to urinary retention Outcome: Progressing   Problem: Pain Managment: Goal: General experience of comfort will improve and/or be controlled Outcome: Progressing   Problem: Skin Integrity: Goal: Risk for impaired skin integrity will decrease Outcome: Not Progressing

## 2023-11-01 NOTE — Care Management Important Message (Signed)
 Important Message  Patient Details  Name: Tricia [name] MRN: [id number] Date of Birth: [birth year]   Important Message Given:  Yes - Medicare IM     [name] 11/01/2023, 1:59 PM

## 2023-11-02 LAB — CBC WITH DIFFERENTIAL/PLATELET
Abs Immature Granulocytes: 0.09 K/uL — ABNORMAL HIGH (ref 0.00–0.07)
Basophils Absolute: 0 K/uL (ref 0.0–0.1)
Basophils Relative: 1 %
Eosinophils Absolute: 0 K/uL (ref 0.0–0.5)
Eosinophils Relative: 0 %
HCT: 23.1 % — ABNORMAL LOW (ref 36.0–46.0)
Hemoglobin: 8 g/dL — ABNORMAL LOW (ref 12.0–15.0)
Immature Granulocytes: 2 %
Lymphocytes Relative: 9 %
Lymphs Abs: 0.4 K/uL — ABNORMAL LOW (ref 0.7–4.0)
MCH: 31.7 pg (ref 26.0–34.0)
MCHC: 34.6 g/dL (ref 30.0–36.0)
MCV: 91.7 fL (ref 80.0–100.0)
Monocytes Absolute: 0.2 K/uL (ref 0.1–1.0)
Monocytes Relative: 4 %
Neutro Abs: 3.7 K/uL (ref 1.7–7.7)
Neutrophils Relative %: 84 %
Platelets: 61 K/uL — ABNORMAL LOW (ref 150–400)
RBC: 2.52 MIL/uL — ABNORMAL LOW (ref 3.87–5.11)
RDW: 14.6 % (ref 11.5–15.5)
WBC: 4.4 K/uL (ref 4.0–10.5)
nRBC: 0 % (ref 0.0–0.2)

## 2023-11-02 LAB — CULTURE, BLOOD (ROUTINE X 2): Special Requests: ADEQUATE

## 2023-11-02 LAB — GLUCOSE, CAPILLARY
Glucose-Capillary: 129 mg/dL — ABNORMAL HIGH (ref 70–99)
Glucose-Capillary: 62 mg/dL — ABNORMAL LOW (ref 70–99)
Glucose-Capillary: 151 mg/dL — ABNORMAL HIGH (ref 70–99)
Glucose-Capillary: 171 mg/dL — ABNORMAL HIGH (ref 70–99)
Glucose-Capillary: 161 mg/dL — ABNORMAL HIGH (ref 70–99)
Glucose-Capillary: 99 mg/dL (ref 70–99)
Glucose-Capillary: 137 mg/dL — ABNORMAL HIGH (ref 70–99)

## 2023-11-02 LAB — CBC
HCT: 21.2 % — ABNORMAL LOW (ref 36.0–46.0)
Hemoglobin: 7.2 g/dL — ABNORMAL LOW (ref 12.0–15.0)
MCH: 31.3 pg (ref 26.0–34.0)
MCHC: 34 g/dL (ref 30.0–36.0)
MCV: 92.2 fL (ref 80.0–100.0)
Platelets: 67 K/uL — ABNORMAL LOW (ref 150–400)
RBC: 2.3 MIL/uL — ABNORMAL LOW (ref 3.87–5.11)
RDW: 14.6 % (ref 11.5–15.5)
WBC: 4.4 K/uL (ref 4.0–10.5)
nRBC: 0 % (ref 0.0–0.2)

## 2023-11-02 LAB — RENAL FUNCTION PANEL
Albumin: 1.5 g/dL — ABNORMAL LOW (ref 3.5–5.0)
Anion gap: 8 (ref 5–15)
BUN: 48 mg/dL — ABNORMAL HIGH (ref 8–23)
CO2: 24 mmol/L (ref 22–32)
Calcium: 6.9 mg/dL — ABNORMAL LOW (ref 8.9–10.3)
Chloride: 100 mmol/L (ref 98–111)
Creatinine, Ser: 2.97 mg/dL — ABNORMAL HIGH (ref 0.44–1.00)
GFR, Estimated: 17 mL/min — ABNORMAL LOW (ref >60)
Glucose, Bld: 131 mg/dL — ABNORMAL HIGH (ref 70–99)
Phosphorus: 3.9 mg/dL (ref 2.5–4.6)
Potassium: 3.6 mmol/L (ref 3.5–5.1)
Sodium: 132 mmol/L — ABNORMAL LOW (ref 135–145)

## 2023-11-02 LAB — MAGNESIUM: Magnesium: 2 mg/dL (ref 1.7–2.4)

## 2023-11-02 MED ORDER — IPRATROPIUM-ALBUTEROL 0.5-2.5 (3) MG/3ML IN SOLN
3.0000 mL | Freq: Two times a day (BID) | RESPIRATORY_TRACT | Status: DC
  Administered 2023-11-02 – 2023-11-10 (×15): 3 mL via RESPIRATORY_TRACT
  Filled 2023-11-02 (×16): qty 3

## 2023-11-02 MED ORDER — LACTATED RINGERS IV SOLN: INTRAVENOUS | Status: DC

## 2023-11-02 MED ORDER — MORPHINE SULFATE (PF) 2 MG/ML IV SOLN
2.0000 mg | INTRAVENOUS | Status: DC | PRN
  Administered 2023-11-02 – 2023-11-14 (×45): 2 mg via INTRAVENOUS
  Filled 2023-11-02 (×47): qty 1

## 2023-11-02 MED ORDER — IPRATROPIUM-ALBUTEROL 0.5-2.5 (3) MG/3ML IN SOLN
3.0000 mL | Freq: Three times a day (TID) | RESPIRATORY_TRACT | Status: DC
  Administered 2023-11-02: 3 mL via RESPIRATORY_TRACT
  Filled 2023-11-02: qty 3

## 2023-11-02 MED ORDER — ENSURE PLUS HIGH PROTEIN PO LIQD: 237.0000 mL | Freq: Two times a day (BID) | ORAL | Status: DC

## 2023-11-02 MED ORDER — OXYCODONE HCL 5 MG PO TABS
5.0000 mg | ORAL_TABLET | Freq: Four times a day (QID) | ORAL | Status: DC | PRN
  Administered 2023-11-02 – 2023-11-15 (×26): 5 mg via ORAL
  Filled 2023-11-02 (×26): qty 1

## 2023-11-02 MED ORDER — SODIUM CHLORIDE 0.9 % IV SOLN
2.0000 g | INTRAVENOUS | Status: DC
  Administered 2023-11-02 – 2023-11-06 (×5): 2 g via INTRAVENOUS
  Filled 2023-11-02 (×6): qty 20

## 2023-11-02 NOTE — Progress Notes (Signed)
 NAME:  Tricia [name], MRN:  [id number], DOB:  01/20/1961, LOS: 3 ADMISSION DATE:  10/30/2023, CONSULTATION DATE:  10/31/2023 REFERRING MD:  Dr. Lenon, CHIEF COMPLAINT:  Hypotension    Brief Pt Description / Synopsis:  63 y.o. female with PMHx most significant for COPD on home oxygen  admitted with Severe Sepsis due to Serratia Bacteremia secondary to severe Cellulitis of bilateral lower extremities, along with AKI and Anion Gap Metabolic Acidosis.  Course complicated by development of Septic Shock requiring vasopressors and Acute COPD Exacerbation.  History of Present Illness:  Tricia [name] is a 63 year old female with a past medical history significant for COPD requiring home supplemental oxygen , asthma, hypertension, type 2 diabetes mellitus, dyslipidemia, GERD who presented to [facility] ED on 10/30/2023 due to acute onset of generalized weakness and bilateral lower extremity swelling (left greater than right).  She reported she had been on the ground for several hours before her son found her.  She denied syncopal episode or hitting her head, but was just too weak to get up off the ground.  She was found covered in urine and feces.  She was also noted to have left leg wound with purulent drainage and right leg ulcer with surrounding erythema and induration.  She also endorsed shortness of breath and fever/chills.   She denies chest pain, palpitations, cough, sputum production, wheezing, abdominal pain, dysuria, nausea, vomiting. 11/02/23- patient improved but still requires levophed .  She passed swallow evaluation and is eating diet.    Past Medical History:  Diagnosis Date   Anxiety    Arthritis    joints and hands/ knees   Asthma    uses inhaler   Benign essential tremor    head   Cervical dystonia    neck pain   Cholesteatoma of left ear    x2   COPD (chronic obstructive pulmonary disease) (HCC)    Cough    Depression    Diabetes mellitus without complication (HCC)     type 2   Diastolic dysfunction    Dyspnea    Dysrhythmia    diastolic dysfunction   GERD (gastroesophageal reflux disease)    Headache    migraines/ one per week   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   Hyperlipidemia    Hypertension    Motion sickness    boat   Neuromuscular disorder (HCC)    neuropathy feet and hands( nerve damage)   Wears dentures    upper and lower    Micro Data:  9/10: COVID/FLU/RSV PCR>> negative  9/10: Blood cultures (4/4 bottles)>> Serratia Marcescens   Antimicrobials:   Anti-infectives (From admission, onward)    Start     Dose/Rate Route Frequency Ordered Stop   11/01/23 2200  meropenem  (MERREM ) 500 mg in sodium chloride  0.9 % 100 mL IVPB        500 mg 200 mL/hr over 30 Minutes Intravenous Every 12 hours 11/01/23 1043     11/01/23 1030  meropenem  (MERREM ) 1 g in sodium chloride  0.9 % 100 mL IVPB  Status:  Discontinued        1 g 200 mL/hr over 30 Minutes Intravenous Every 12 hours 11/01/23 1013 11/01/23 1043   10/31/23 2100  ceFEPIme  (MAXIPIME ) 2 g in sodium chloride  0.9 % 100 mL IVPB  Status:  Discontinued        2 g 200 mL/hr over 30 Minutes Intravenous Every 24 hours 10/30/23 2140 11/01/23 1013   10/30/23 2218  vancomycin  variable dose per unstable renal function (pharmacist dosing)  Status:  Discontinued         Does not apply See admin instructions 10/30/23 2218 10/31/23 0909   10/30/23 2145  vancomycin  (VANCOREADY) IVPB 750 mg/150 mL        750 mg 150 mL/hr over 60 Minutes Intravenous  Once 10/30/23 2136 10/31/23 0101   10/30/23 2130  vancomycin  (VANCOCIN ) IVPB 1000 mg/200 mL premix  Status:  Discontinued        1,000 mg 200 mL/hr over 60 Minutes Intravenous  Once 10/30/23 2127 10/30/23 2133   10/30/23 2130  ceFEPIme  (MAXIPIME ) 2 g in sodium chloride  0.9 % 100 mL IVPB  Status:  Discontinued        2 g 200 mL/hr over 30 Minutes Intravenous  Once 10/30/23 2127 10/30/23 2140   10/30/23 2030  ceFEPIme  (MAXIPIME ) 2 g in sodium  chloride 0.9 % 100 mL IVPB        2 g 200 mL/hr over 30 Minutes Intravenous  Once 10/30/23 2023 10/30/23 2205   10/30/23 2015  metroNIDAZOLE  (FLAGYL ) IVPB 500 mg        500 mg 100 mL/hr over 60 Minutes Intravenous  Once 10/30/23 2000 10/30/23 2330   10/30/23 2000  cefTRIAXone  (ROCEPHIN ) 2 g in sodium chloride  0.9 % 100 mL IVPB  Status:  Discontinued        2 g 200 mL/hr over 30 Minutes Intravenous Once 10/30/23 1953 10/30/23 2000   10/30/23 2000  vancomycin  (VANCOCIN ) IVPB 1000 mg/200 mL premix        1,000 mg 200 mL/hr over 60 Minutes Intravenous  Once 10/30/23 1953 10/30/23 2103       Significant Hospital Events: Including procedures, antibiotic start and stop dates in addition to other pertinent events   9/10: Admitted by [facility] for sepsis due to bilateral LE cellulitis and AKI.   9/11: Pt becoming more hypotensive concerning for possible developing septic shock, PCCM consulted in case were to need vasopressors.  Nephrology consulted for AKI, started on Bicarb gtt.  Left femoral CVC placed due to limited peripheral IV access and vasopressor needs.  Interim History / Subjective:  As outlined above under Significant Hospital Events section  Objective   Blood pressure (!) 112/53, pulse 95, temperature 98.5 F (36.9 C), temperature source Axillary, resp. rate 11, height 5' 8 (1.727 m), weight 71.7 kg, SpO2 99%.        Intake/Output Summary (Last 24 hours) at 11/02/2023 0924 Last data filed at 11/02/2023 9162 Gross per 24 hour  Intake 3417.04 ml  Output 1085 ml  Net 2332.04 ml   Filed Weights   10/30/23 1925 10/31/23 1346 11/02/23 0500  Weight: 70.3 kg 64.2 kg 71.7 kg    Examination: General: Acute on chronically frail appearing elderly female, sitting in bed, on room air, no acute distress HENT: Atraumatic, normocephalic, neck supple, no JVD Lungs: Mild expiratory wheezing throughout, even, nonlabored, normal effort Cardiovascular: Regular rate and rhythm (sinus rhythm on  telemetry), S1-S2, no murmurs, rubs, gallops Abdomen: Soft, slightly tender to palpation, nondistended, no guarding or rebound tenderness, bowel sounds positive x 4 Extremities: Severe cellulitis to bilateral lower extremities with wounds to left lateral shin and right shin (see images below), normal bulk and tone, no deformities Neuro: Awake and alert, oriented x 4, moves all extremities commands, no focal deficits noted, speech clear, pupils PERRLA GU: Deferred     Resolved Hospital Problem list     Assessment & Plan:   #  1 Shock: Septic - bacteremia - serratia - present on admission - source is blood              Merem now IV dcd cefepime  due to encephalopathy   #2 bilateral LE cellulitis Chronic HFpEF         On abx  , imaging done with no finding of nec fasc or osteomyelitis       S/p vasc surg eval - no ischemia   PMHx: Dyslipidemia, Hypotension   Echocardiogram 09/13/23: LVEF 60-65%, grade I Diastolic dysfunction, RV systolic function normal, RV size normal -Continuous cardiac monitoring -Maintain MAP >65 -Gentle IV fluids -Vasopressors as needed to maintain MAP goal -Continue home midodrine   -Trend lactic acid until normalized -Consider repeat Echocardiogram  -Diuresis as BP and renal function permits ~ holding due to shock and AKI  #Acute Kidney Injury- KDIGO 4 - due to septic nephropathy             S/p nephrology evaluation - made >1L urine , will hold off dialysis  #Anion Gap Metabolic Acidosis #Mild Hyperkalemia #Mild Hyponatremia  -Monitor I&O's / urinary output -Follow BMP -Ensure adequate renal perfusion -Avoid nephrotoxic agents as able -Replace electrolytes as indicated ~ Pharmacy following for assistance with electrolyte replacement -Start Bicarb gtt -Consult Nephrology, appreciate input   #Acute on Chronic Hypoxic Respiratory Failure #Acute COPD Exacerbation  PMHx: COPD requiring home oxygen  -Supplemental O2 as needed to maintain O2 sats 88 to  92% -BiPAP if needed -Follow intermittent Chest X-ray & ABG as needed -Bronchodilators  -IV Steroids -ABX as above -Pulmonary toilet as able    Best Practice (right click and Reselect all SmartList Selections daily)   Diet/type: Regular consistency (see orders) DVT prophylaxis: LMWH GI prophylaxis: PPI Lines: Left femoral CVC placed 9/11 Foley:  N/A Code Status:  full code Last date of multidisciplinary goals of care discussion [N/A]    Labs   CBC: Recent Labs  Lab 10/30/23 1928 10/31/23 0610 10/31/23 1629 11/01/23 0352 11/02/23 0400  WBC 10.1 7.7 17.1* 15.6* 4.4  NEUTROABS 8.9*  --   --   --   --   HGB 11.8* 9.6* 9.8* 9.4* 7.2*  HCT 36.9 28.9* 29.7* 27.4* 21.2*  MCV 97.4 96.3 96.4 93.5 92.2  PLT 367 173 184 146* 67*    Basic Metabolic Panel: Recent Labs  Lab 10/31/23 0610 10/31/23 1632 10/31/23 1938 11/01/23 0352 11/02/23 0400  NA 131* 129* 129* 131* 132*  K 5.2* 5.0 4.7 4.5 3.6  CL 105 102 102 102 100  CO2 18* 16* 17* 18* 24  GLUCOSE 62* 182* 206* 217* 131*  BUN 40* 47* 47* 51* 48*  CREATININE 3.29* 3.47* 3.28* 3.51* 2.97*  CALCIUM  7.2* 7.0* 7.0* 7.2* 6.9*  MG  --   --   --  1.6* 2.0  PHOS  --   --   --  5.3* 3.9   GFR: Estimated Creatinine Clearance: 19.6 mL/min (A) (by C-G formula based on SCr of 2.97 mg/dL (H)). Recent Labs  Lab 10/30/23 1928 10/30/23 2213 10/31/23 0610 10/31/23 1629 10/31/23 1930 11/01/23 0352 11/02/23 0400  WBC 10.1  --  7.7 17.1*  --  15.6* 4.4  LATICACIDVEN 2.6* 2.5*  --  2.6* 1.8  --   --     Liver Function Tests: Recent Labs  Lab 10/30/23 1928 11/01/23 0352 11/02/23 0400  AST 25  --   --   ALT 9  --   --   ALKPHOS 123  --   --  BILITOT 1.2  --   --   PROT 5.7*  --   --   ALBUMIN  2.0* <1.5* 1.5*   No results for input(s): LIPASE, AMYLASE in the last 168 hours. No results for input(s): AMMONIA in the last 168 hours.  ABG    Component Value Date/Time   HCO3 38.2 (H) 09/08/2021 1647   O2SAT  51.7 09/08/2021 1647     Coagulation Profile: Recent Labs  Lab 10/30/23 1928 10/31/23 0610  INR 1.3* 1.7*    Cardiac Enzymes: Recent Labs  Lab 10/30/23 2213  CKTOTAL 38    HbA1C: Hemoglobin A1C  Date/Time Value Ref Range Status  05/01/2023 02:51 PM 6.0 (A) 4.0 - 5.6 % Final  11/01/2022 10:09 AM 5.9 (A) 4.0 - 5.6 % Final  10/09/2012 04:52 AM 7.4 (H) 4.2 - 6.3 % Final    Comment:    The American Diabetes Association recommends that a primary goal of therapy should be <7% and that physicians should reevaluate the treatment regimen in patients with HbA1c values consistently >8%.   07/02/2012 05:33 AM 7.1 (H) 4.2 - 6.3 % Final    Comment:    The American Diabetes Association recommends that a primary goal of therapy should be <7% and that physicians should reevaluate the treatment regimen in patients with HbA1c values consistently >8%.    HbA1c, POC (controlled diabetic range)  Date/Time Value Ref Range Status  03/19/2018 02:04 PM 13.7 (A) 0.0 - 7.0 % Final  10/29/2017 02:18 PM 14.0 (A) 0.0 - 7.0 % Final    Comment:    >14.0   Hgb A1c MFr Bld  Date/Time Value Ref Range Status  10/30/2023 10:52 PM 5.1 4.8 - 5.6 % Final    Comment:    (NOTE) Diagnosis of Diabetes The following HbA1c ranges recommended by the American Diabetes Association (ADA) may be used as an aid in the diagnosis of diabetes mellitus.  Hemoglobin             Suggested A1C NGSP%              Diagnosis  <5.7                   Non Diabetic  5.7-6.4                Pre-Diabetic  >6.4                   Diabetic  <7.0                   Glycemic control for                       adults with diabetes.    06/05/2021 02:54 PM 6.6 (H) <5.7 % of total Hgb Final    Comment:    For someone without known diabetes, a hemoglobin A1c value of 6.5% or greater indicates that they may have  diabetes and this should be confirmed with a follow-up  test. . For someone with known diabetes, a value <7%  indicates  that their diabetes is well controlled and a value  greater than or equal to 7% indicates suboptimal  control. A1c targets should be individualized based on  duration of diabetes, age, comorbid conditions, and  other considerations. . Currently, no consensus exists regarding use of hemoglobin A1c for diagnosis of diabetes for children. .     CBG: Recent Labs  Lab 11/01/23 2004 11/01/23 2321 11/02/23  0400 11/02/23 0714 11/02/23 0800  GLUCAP 225* 181* 129* 62* 151*    Review of Systems:   Positives in BOLD: Gen: Denies fever, chills, weight change, fatigue, night sweats HEENT: Denies blurred vision, double vision, hearing loss, tinnitus, sinus congestion, rhinorrhea, sore throat, neck stiffness, dysphagia PULM: Denies shortness of breath, cough, sputum production, hemoptysis, wheezing CV: Denies chest pain, edema, orthopnea, paroxysmal nocturnal dyspnea, palpitations GI: Denies abdominal pain, nausea, vomiting, diarrhea, hematochezia, melena, constipation, change in bowel habits GU: Denies dysuria, hematuria, polyuria, oliguria, urethral discharge Endocrine: Denies hot or cold intolerance, polyuria, polyphagia or appetite change Derm: Denies rash, dry skin, scaling or peeling skin change, cellulitis of bilateral lower extremities  Heme: Denies easy bruising, bleeding, bleeding gums Neuro: Denies headache, numbness, weakness, slurred speech, loss of memory or consciousness   Past Medical History:  She,  has a past medical history of Anxiety, Arthritis, Asthma, Benign essential tremor, Cervical dystonia, Cholesteatoma of left ear, COPD (chronic obstructive pulmonary disease) (HCC), Cough, Depression, Diabetes mellitus without complication (HCC), Diastolic dysfunction, Dyspnea, Dysrhythmia, GERD (gastroesophageal reflux disease), Headache, HOH (hard of hearing), Hyperlipidemia, Hypertension, Motion sickness, Neuromuscular disorder (HCC), and Wears dentures.   Surgical  History:   Past Surgical History:  Procedure Laterality Date   CARPAL TUNNEL RELEASE Bilateral    x2 right, 1x on left   COLONOSCOPY     COLONOSCOPY WITH PROPOFOL  N/A 04/25/2017   Procedure: COLONOSCOPY WITH PROPOFOL ;  Surgeon: Jinny Carmine, MD;  Location: [facility];  Service: Endoscopy;  Laterality: N/A;  diabetic-oral med   DILATION AND CURETTAGE OF UTERUS     ESOPHAGOGASTRODUODENOSCOPY (EGD) WITH PROPOFOL  N/A 01/10/2021   Procedure: ESOPHAGOGASTRODUODENOSCOPY (EGD) WITH PROPOFOL ;  Surgeon: Janalyn Keene NOVAK, MD;  Location: [facility];  Service: Endoscopy;  Laterality: N/A;  Diabetic   EXTERNAL EAR SURGERY Left    x2   POLYPECTOMY  04/25/2017   Procedure: POLYPECTOMY INTESTINAL;  Surgeon: Jinny Carmine, MD;  Location: [facility];  Service: Endoscopy;;   SPINE SURGERY     herniated disc   TUBAL LIGATION       Social History:   reports that she has been smoking cigarettes. She started smoking about 46 years ago. She has a 46.5 pack-year smoking history. She has never used smokeless tobacco. She reports that she does not drink alcohol and does not use drugs.   Family History:  Her family history includes Anxiety disorder in her mother; Bipolar disorder in her daughter and daughter; Cervical cancer in her daughter; Diabetes in her daughter; Drug abuse in her daughter; Emphysema in her mother; Hypertension in her daughter; Lung cancer in her maternal aunt, maternal grandfather, maternal grandmother, and maternal uncle; Multiple sclerosis in her daughter; Stroke in her father; Throat cancer in her father.   Allergies Allergies  Allergen Reactions   Augmentin [Amoxicillin-Pot Clavulanate] Diarrhea   Penicillins Itching     Home Medications  Prior to Admission medications   Medication Sig Start Date End Date Taking? Authorizing Provider  ascorbic acid  (VITAMIN C ) 500 MG tablet Take 1 tablet (500 mg total) by mouth 2 (two) times daily. 09/27/23  Yes Alexander,  Natalie, DO  aspirin  EC 81 MG tablet Take 81 mg by mouth daily. Swallow whole.   Yes [provider]  calcium  carbonate (TUMS - DOSED IN MG ELEMENTAL CALCIUM ) 500 MG chewable tablet Chew 2 tablets (400 mg of elemental calcium  total) by mouth 3 (three) times daily as needed for indigestion or heartburn. 09/30/23  Yes Alexander, Natalie,  DO  carbidopa -levodopa  (SINEMET  CR) 50-200 MG tablet Take 1 tablet by mouth at bedtime. 07/26/23 07/25/24 Yes [provider]  carbidopa -levodopa  (SINEMET  IR) 25-100 MG tablet Take 1.5 tablets by mouth 3 (three) times daily. 01/25/20 10/30/23 Yes Maree Jannett POUR, MD  colchicine  0.6 MG tablet Take 1 tablet (0.6 mg total) by mouth 2 (two) times daily as needed for up to 3 days (gout flare). 09/27/23 10/30/23 Yes Alexander, Natalie, DO  cyclobenzaprine  (FLEXERIL ) 10 MG tablet Take 10 mg by mouth at bedtime. 08/01/21  Yes [provider]  diphenoxylate -atropine  (LOMOTIL ) 2.5-0.025 MG tablet Take 2 tablets by mouth 4 (four) times daily as needed for diarrhea or loose stools. 09/27/23  Yes Alexander, Natalie, DO  DULoxetine  (CYMBALTA ) 60 MG capsule Take 1 capsule (60 mg total) by mouth daily. 05/01/23  Yes Sowles, Krichna, MD  empagliflozin  (JARDIANCE ) 25 MG TABS tablet Take 1 tablet (25 mg total) by mouth daily before breakfast. 05/01/23  Yes Sowles, Krichna, MD  furosemide  (LASIX ) 40 MG tablet Take 1 tablet (40 mg total) by mouth daily as needed. 06/21/17  Yes [name], Almarie BRAVO, NP  ipratropium-albuterol  (DUONEB) 0.5-2.5 (3) MG/3ML SOLN Inhale 3 mLs into the lungs every 6 (six) hours as needed. 06/21/17  Yes [name], Aira E, NP  leptospermum manuka honey (MEDIHONEY) PSTE paste Apply 1 Application topically daily. Apply to sacral wound Apply thin layer (3 mm) to wound. 09/28/23  Yes Alexander, Natalie, DO  levocetirizine (XYZAL ) 5 MG tablet Take 1 tablet (5 mg total) by mouth every evening. 05/01/23  Yes Sowles, Krichna, MD  midodrine  (PROAMATINE ) 5 MG tablet Take  1 tablet (5 mg total) by mouth 3 (three) times daily with meals. 09/27/23  Yes Alexander, Natalie, DO  montelukast  (SINGULAIR ) 10 MG tablet Take 1 tablet by mouth at bedtime. 02/21/21  Yes Fleming, Herbon E, MD  Multiple Vitamin (MULTIVITAMIN WITH MINERALS) TABS tablet Take 1 tablet by mouth daily. 09/28/23  Yes Alexander, Natalie, DO  omeprazole  (PRILOSEC) 40 MG capsule Take 1 capsule (40 mg total) by mouth daily. 05/01/23  Yes Sowles, Krichna, MD  oxyCODONE  (OXY IR/ROXICODONE ) 5 MG immediate release tablet Take 1 tablet (5 mg total) by mouth every 4 (four) hours as needed for moderate pain (pain score 4-6) or severe pain (pain score 7-10). 09/27/23  Yes Alexander, Natalie, DO  QUEtiapine  (SEROQUEL ) 25 MG tablet Take 1 tablet (25 mg total) by mouth at bedtime. 05/01/23  Yes Sowles, Krichna, MD  rosuvastatin  (CRESTOR ) 5 MG tablet Take 1 tablet (5 mg total) by mouth at bedtime. 05/01/23  Yes Sowles, Krichna, MD  SYMBICORT  160-4.5 MCG/ACT inhaler INHALE 2 PUFFS TWICE A DAY RINSE MOUTH WITH WATER  AFTER EACH USE 07/09/22  Yes Sowles, Krichna, MD  theophylline  (UNIPHYL) 400 MG 24 hr tablet Take 1 tablet by mouth daily.  12/27/17  Yes Fleming, Herbon E, MD  tiotropium (SPIRIVA ) 18 MCG inhalation capsule Place 1 capsule into inhaler and inhale daily. pm 08/27/13  Yes Theotis Lavelle BRAVO, MD  VENTOLIN  HFA 108 (90 Base) MCG/ACT inhaler INHALE 1 PUFF BY MOUTH AS NEEDED 04/26/20  Yes Sowles, Krichna, MD  zinc  sulfate, 50mg  elemental zinc , 220 (50 Zn) MG capsule Take 1 capsule (220 mg total) by mouth daily. 09/28/23  Yes Marsa Edelman, DO     Critical care provider statement:   Total critical care time: 32 minutes   Performed by: Parris MD   Critical care time was exclusive of separately billable procedures and treating other patients.   Critical care was  necessary to treat or prevent imminent or life-threatening deterioration.   Critical care was time spent personally by me on the following activities: development  of treatment plan with patient and/or surrogate as well as nursing, discussions with consultants, evaluation of patient's response to treatment, examination of patient, obtaining history from patient or surrogate, ordering and performing treatments and interventions, ordering and review of laboratory studies, ordering and review of radiographic studies, pulse oximetry and re-evaluation of patient's condition.    Nunzio Banet, M.D.  Pulmonary & Critical Care Medicine

## 2023-11-02 NOTE — Progress Notes (Signed)
 Speech Language Pathology Treatment: Dysphagia  Patient Details Name: Tricia [name] MRN: [id number] DOB: Nov 09, 1960 Today's Date: 11/02/2023 Time: 8984-8961 SLP Time Calculation (min) (ACUTE ONLY): 23 min  Assessment / Plan / Recommendation Clinical Impression  Pt seen for continued dysphagia intervention. Pt spiking fever, though WBC remains WNL. No updated chest imaging. Today pt with improvement in alertness- eyes open and verbally responsive for duration of session. Oral care completed. Noted congested cough at baseline. Pt on 2L nasal canula- with O2 saturations maintaining ~95 (single exception with drop to 82, no overt reaction and quickly returning to 90's, suspect sensor error). Trials completed with thin liquids, puree, and regular solids. Single delayed cough (grossly consistent with baseline congested cough) noted after thin liquid trial, not repeated with further testing. Oral phase impacted by lack of natural dentition- pt reports intermittent use of dentures for PO intake. With liquid wash and extended time, pt able to clear oral cavity.   Sustained and sufficient alertness is key for PO intake- recommend close supervision and support for PO intake to ensure LOA. General aspiration precautions apply (slow rate, small bites, elevated HOB). Recommend mech soft and thin liquids with meds in puree. RN aware of recommendations. SLP will continue to monitor.     HPI HPI: Tricia [name] is a 63 y.o. female who presented to ED with acute onset of generalizated weakness and BLE swelling (L>R) after being found on the ground for several hours covered in urine and feces. Pt with a PMH significant for anxiety, osteoarthritis, asthma, COPD, depression, type 2 diabetes mellitus, GERD, dyslipidemia, and hypertension. CXR 1. Emphysema and background scarring unchanged since prior study. No  acute airspace disease.      SLP Plan  Continue with current plan of care           Recommendations  Diet recommendations: Dysphagia 3 (mechanical soft);Thin liquid Liquids provided via: Cup;Straw Medication Administration: Whole meds with puree Supervision: Staff to assist with self feeding;Full supervision/cueing for compensatory strategies (for alertness) Compensations: Minimize environmental distractions;Slow rate;Small sips/bites Postural Changes and/or Swallow Maneuvers: Seated upright 90 degrees;Upright 30-60 min after meal                  Oral care BID;Staff/trained caregiver to provide oral care   Frequent or constant Supervision/Assistance Dysphagia, oropharyngeal phase (R13.12)     Continue with current plan of care    [place] Orvilla Truett Clapp, MS, CCC-SLP Speech Language Pathologist Rehab Services; [facility] [phone] (ascom)   [place] J Clapp  11/02/2023, 12:04 PM

## 2023-11-02 NOTE — TOC Initial Note (Signed)
 Transition of Care [facility]) - Initial/Assessment Note    Patient Details  Name: Tricia [name] MRN: [id number] Date of Birth: 15-Aug-1960  Transition of Care [facility]) CM/SW Contact:    Tricia JINNY Ruts, LCSW Phone Number: 11/02/2023, 1:23 PM  Clinical Narrative:                 Chart reviewed. The patient was admitted for Sepsis due to cellulitis. I attempted to speak with the patient at bedside today. The patient did not wake up to consult. I called the patient son-in-law, Dominique Labs. Per chart the patient PCP is Dorette Loron. The patient son in law reports that the patient lives with him but he works on the road and It Is unsafe for the patient to be alone in the home.   The patient son in law reports that the patient needs assistance in the home completing daily living task. The patient son in law reports that the patient got displaced from [facility] because she was smoking. The patient son in law reports that the patient granddaughter or he would drive the patient to medical appointments. The patient son in law reports that he will assist the patient during discharge. Thr patient son in law reports that the patient uses [facility] for a pharmacy and copays are affordable. The patient son in law reports that the patient has had HH in the past but does not remember with whom. The patient son in law reports that the patient was displaced from [facility] 3 weeks ago. The patient son in law reports that the patient has a electrical wheel chair, oxygen  machine at night, and walker in the home. The patient has no other questions or concerns at the time of the assessment.   There are no TOC needs at this time. TOC will follow the patient until discharge.        Patient Goals and CMS Choice            Expected Discharge Plan and Services                                              Prior Living Arrangements/Services                       Activities of  Daily Living      Permission Sought/Granted                  Emotional Assessment              Admission diagnosis:  Hyperkalemia [E87.5] Hyponatremia [E87.1] Weakness [R53.1] Leg swelling [M79.89] Wound infection [T14.8XXA, L08.9] AKI (acute kidney injury) (HCC) [N17.9] Sepsis (HCC) [A41.9] Sepsis due to cellulitis (HCC) [L03.90, A41.9] Sepsis, due to unspecified organism, unspecified whether acute organ dysfunction present [facility]) [A41.9] Patient Active Problem List   Diagnosis Date Noted   Leg swelling 11/01/2023   Sepsis (HCC) 10/31/2023   Sepsis due to cellulitis (HCC) 10/30/2023   Asthma, chronic 10/30/2023   AKI (acute kidney injury) (HCC) 10/30/2023   Hyperkalemia 10/30/2023   Hyponatremia 10/30/2023   Physical deconditioning 09/23/2023   Acute metabolic encephalopathy 09/23/2023   Pressure injury of skin 09/23/2023   Chronic respiratory failure with hypoxia (HCC) 09/22/2023   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/22/2023   SIRS (systemic inflammatory response syndrome) (HCC) 09/22/2023  Chronic prescription opiate use 09/22/2023   Frequent falls 09/22/2023   Protein-calorie malnutrition, severe 09/17/2023   Malnutrition of moderate degree 09/02/2023   Syncope, vasovagal 08/29/2023   Parkinson disease (HCC) 08/29/2023   Anxiety and depression 08/29/2023   Chronic obstructive pulmonary disease (COPD) (HCC) 08/29/2023   GERD without esophagitis 08/29/2023   Elevated CK 08/29/2023   Type 2 diabetes mellitus with stage 3 chronic kidney disease (HCC) 08/29/2023   Acute kidney injury superimposed on chronic kidney disease (HCC) 08/28/2023   Sacral wound 02/21/2022   Pressure injury of sacral region, stage 1 02/21/2022   CAP (community acquired pneumonia) 09/08/2021   Nausea 09/08/2021   Hypoalbuminemia due to protein-calorie malnutrition (HCC) 09/08/2021   Lumbar herniated disc 07/11/2021   Calculus of gallbladder without  cholecystitis without obstruction 07/11/2021   Senile purpura (HCC) 06/05/2021   Atherosclerosis of aorta (HCC) 06/05/2021   Parkinson's disease (HCC) 06/05/2021   Chronic kidney disease (CKD) stage G3a/A2, moderately decreased glomerular filtration rate (GFR) between 45-59 mL/min/1.73 square meter and albuminuria creatinine ratio between 30-299 mg/g (HCC) 06/05/2021   Esophageal dysphagia    Gastric erythema    Columnar-lined esophagus    Moderate malnutrition (HCC) 09/26/2020   Centrilobular emphysema (HCC) 03/30/2020   History of urinary retention 11/18/2019   GAD (generalized anxiety disorder) 10/13/2019   MDD (major depressive disorder), recurrent episode, moderate (HCC) 10/13/2019   At risk for long QT syndrome 10/13/2019   Nonrheumatic mitral valve regurgitation 05/04/2019   Benign neoplasm of descending colon    Polyp of sigmoid colon    History of Klebsiella UTI 09/11/2023 01/24/2015   Polyneuropathy 10/21/2014   Chronic venous insufficiency 10/05/2014   Bilateral leg edema 08/16/2014   Major depression in partial remission (HCC) 08/16/2014   Acid reflux 08/16/2014   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   Asthma, moderate persistent 08/16/2014   Carpal tunnel syndrome 08/16/2014   Cervical pain 08/16/2014   CAFL (chronic airflow limitation) (HCC) 08/16/2014   Type 2 diabetes mellitus with peripheral neuropathy (HCC) 08/16/2014   Diabetes mellitus type 2, insulin  dependent (HCC) 08/16/2014   Dyslipidemia 08/16/2014   Tobacco use disorder 08/16/2014   Benign neoplasm of stomach 08/16/2014   Gout 08/16/2014   Mixed hyperlipidemia 08/16/2014   Low back pain 08/16/2014   Lumbar radiculopathy 08/16/2014   Headache, migraine 08/16/2014   Arthralgia of multiple joints 08/16/2014   Vitamin D  deficiency 08/16/2014   Primary osteoarthritis of both knees 06/22/2014   Benign essential tremor 10/02/2013   Cervical dystonia 10/02/2013   Chronic diastolic heart failure (HCC)  90/71/7985   Chronic pain 11/13/2012   PCP:  Sowles, Krichna, MD Pharmacy:   [facility] [place], [place] - [address] [place] [address] Phone: [phone] Fax: [phone]  [facility]) - [place], [place] - [address] [facility] [address] [place] [address] [place] [address] Phone: [phone] Fax: [phone]  [facility] - [place], [place] - [address] [place] [address] Phone: [phone] Fax: [phone]     Social Drivers of Health (SDOH) Social History: SDOH Screenings   Food Insecurity: Patient Unable To Answer (10/31/2023)  Housing: Unknown (10/31/2023)  Transportation Needs: Patient Unable To Answer (10/31/2023)  Utilities: Patient Unable To Answer (10/31/2023)  Alcohol Screen: Low Risk  (03/07/2023)  Depression (PHQ2-9): High Risk (05/01/2023)  Financial Resource Strain: Patient Declined (06/18/2023)   Received from [facility]  Physical Activity: Sufficiently Active (03/07/2023)  Social Connections: Socially Isolated (08/29/2023)  Stress: No Stress Concern Present (03/07/2023)  Tobacco Use: High Risk (10/30/2023)  Health Literacy: Adequate Health Literacy (03/07/2023)   SDOH Interventions:     Readmission Risk Interventions    11/02/2023    1:21 PM  Readmission Risk Prevention Plan  Transportation Screening Complete  Medication Review (RN Care Manager) Complete  PCP or Specialist appointment within 3-5 days of discharge Complete  SW Recovery Care/Counseling Consult Complete  Palliative Care Screening Not Applicable  Skilled Nursing Facility Not Applicable

## 2023-11-02 NOTE — Evaluation (Signed)
 Physical Therapy Evaluation Patient Details Name: Tricia [name] MRN: [id number] DOB: Nov 29, 1960 Today's Date: 11/02/2023  History of Present Illness  Pt is a 63 y.o. female with medical history significant of COPD, prediabetes, hyperlipidemia, GERD, migraine headaches, anxiety, depression, benign essential tremor that presented to ED for BLE swelling, weakness. Workup for cellulitis of BLE, AKI, admission complicated by septic shock and acute COPD exacerbation.   Clinical Impression  Pt alert, oriented to self, place, stated month as October. pt reported living with SIL, having a walker with no wheels, ambulatory in home with the walker. No family present to confirm baseline. per chart review pt ambulatory with PT 09/26/2023. Pt limited by pain and fatigue this session, as well as bowel incontinence. RN in room to assist with foley care. Pt required maxAx2 for all bed mobility, and multimodal cueing to maximize participation. PT educated pt on bed level exercises, pt verbalized understanding.  Overall the patient demonstrated deficits (see PT Problem List) that impede the patient's functional abilities, safety, and mobility and would benefit from skilled PT intervention.           If plan is discharge home, recommend the following: Two people to help with walking and/or transfers;Two people to help with bathing/dressing/bathroom;Help with stairs or ramp for entrance;Assist for transportation;Assistance with feeding;Assistance with cooking/housework   Can travel by private vehicle   No    Equipment Recommendations Other (comment) (TBD)  Recommendations for Other Services       Functional Status Assessment Patient has had a recent decline in their functional status and demonstrates the ability to make significant improvements in function in a reasonable and predictable amount of time.     Precautions / Restrictions Precautions Precautions: Fall Recall of Precautions/Restrictions:  Impaired Restrictions Weight Bearing Restrictions Per Provider Order: No      Mobility  Bed Mobility Overal bed mobility: Needs Assistance Bed Mobility: Rolling Rolling: Max assist, +2 for physical assistance, Used rails         General bed mobility comments: mutlimodal cueing, pt limited by pain    Transfers                   General transfer comment: limited by pain/ongoing BM    Ambulation/Gait                  Stairs            Wheelchair Mobility     Tilt Bed    Modified Rankin (Stroke Patients Only)       Balance                                             Pertinent Vitals/Pain Pain Assessment Pain Assessment: Faces Faces Pain Scale: Hurts whole lot Pain Location: back Pain Descriptors / Indicators: Discomfort, Grimacing, Moaning, Guarding, Sore Pain Intervention(s): Limited activity within patient's tolerance, Monitored during session, Premedicated before session, Repositioned, Patient requesting pain meds-RN notified    Home Living Family/patient expects to be discharged to:: Private residence Living Arrangements: Other relatives (SIL) Available Help at Discharge: Available PRN/intermittently Type of Home: House Home Access: Stairs to enter Entrance Stairs-Rails: Lawyer of Steps: 4   Home Layout: One level Home Equipment: Agricultural consultant (2 wheels);Shower seat - built in Additional Comments: pt reported living with SIL, having a walker with no wheels, ambulatory in home with  RW. no family present to confirm baseline. per chart review pt ambulatory with PT 09/26/2023    Prior Function Prior Level of Function : Patient poor historian/Family not available                     Extremity/Trunk Assessment   Upper Extremity Assessment Upper Extremity Assessment: Generalized weakness    Lower Extremity Assessment Lower Extremity Assessment: Generalized weakness (initially  unable to lift against gravity but with time and mobility, improved to minimally clearing BLE for pillow placement)       Communication        Cognition Arousal: Alert Behavior During Therapy: Flat affect   PT - Cognitive impairments: No family/caregiver present to determine baseline                       PT - Cognition Comments: pt oriented to self, place, month as october Following commands: Impaired Following commands impaired: Follows one step commands with increased time     Cueing Cueing Techniques: Verbal cues, Gestural cues, Tactile cues     General Comments      Exercises     Assessment/Plan    PT Assessment Patient needs continued PT services  PT Problem List Decreased strength;Decreased range of motion;Decreased activity tolerance;Decreased knowledge of use of DME;Decreased balance;Decreased safety awareness;Decreased mobility;Decreased knowledge of precautions       PT Treatment Interventions DME instruction;Balance training;Gait training;Neuromuscular re-education;Stair training;Functional mobility training;Patient/family education;Therapeutic activities;Therapeutic exercise    PT Goals (Current goals can be found in the Care Plan section)  Acute Rehab PT Goals Patient Stated Goal: to have less pain PT Goal Formulation: With patient Time For Goal Achievement: 11/16/23 Potential to Achieve Goals: Good    Frequency Min 2X/week     Co-evaluation               AM-PAC PT 6 Clicks Mobility  Outcome Measure Help needed turning from your back to your side while in a flat bed without using bedrails?: A Lot Help needed moving from lying on your back to sitting on the side of a flat bed without using bedrails?: Total Help needed moving to and from a bed to a chair (including a wheelchair)?: Total Help needed standing up from a chair using your arms (e.g., wheelchair or bedside chair)?: Total Help needed to walk in hospital room?: Total Help  needed climbing 3-5 steps with a railing? : Total 6 Click Score: 7    End of Session   Activity Tolerance: Patient limited by fatigue;Patient limited by pain Patient left: in bed;with call bell/phone within reach;with bed alarm set Nurse Communication: Mobility status;Patient requests pain meds PT Visit Diagnosis: Other abnormalities of gait and mobility (R26.89);Difficulty in walking, not elsewhere classified (R26.2);Muscle weakness (generalized) (M62.81)    Time: 8569-8545 PT Time Calculation (min) (ACUTE ONLY): 24 min   Charges:   PT Evaluation $PT Eval Moderate Complexity: 1 Mod PT Treatments $Therapeutic Activity: 8-22 mins PT General Charges $$ ACUTE PT VISIT: 1 Visit       Doyal Shams PT, DPT 3:06 PM,11/02/23

## 2023-11-02 NOTE — Evaluation (Signed)
 Occupational Therapy Evaluation Patient Details Name: Tricia [name] MRN: [id number] DOB: [birth year] Today's Date: 11/02/2023   History of Present Illness   Pt is a 63 y.o. female with medical history significant of COPD, prediabetes, hyperlipidemia, GERD, migraine headaches, anxiety, depression, benign essential tremor that presented to ED for BLE swelling, weakness. Workup for cellulitis of BLE, AKI, admission complicated by septic shock and acute COPD exacerbation.     Clinical Impressions Ms. Bessey was seen for OT evaluation this date. Pt remains pleasantly confused, oriented to self only during session. No family at bedside to verify PLOF. Per chart, prior to hospital admission, pt was residing with her Son-in-law. She had recently been discharged from her STR for smoking. Pt presents with deficits in strength, cognition, activity tolerance, and pain management, affecting safe and optimal ADL completion. Pt currently requires TOTAL A for bed-level peri-care, MAX A +2 for bed mobility.  Pt would benefit from skilled OT services to address noted impairments and functional limitations (see below for any additional details) in order to maximize safety and independence while minimizing future risk of falls, injury, and readmission. Anticipate the need for follow up OT services upon acute hospital DC.      If plan is discharge home, recommend the following:   Two people to help with walking and/or transfers;Two people to help with bathing/dressing/bathroom;Assistance with cooking/housework;Assist for transportation;Help with stairs or ramp for entrance;Supervision due to cognitive status;Direct supervision/assist for medications management     Functional Status Assessment   Patient has had a recent decline in their functional status and demonstrates the ability to make significant improvements in function in a reasonable and predictable amount of time.     Equipment  Recommendations   Other (comment) (defer)     Recommendations for Other Services         Precautions/Restrictions   Precautions Precautions: Fall Recall of Precautions/Restrictions: Impaired Restrictions Weight Bearing Restrictions Per Provider Order: No     Mobility Bed Mobility Overal bed mobility: Needs Assistance Bed Mobility: Rolling Rolling: Max assist, +2 for physical assistance, Used rails         General bed mobility comments: mutlimodal cueing, pt limited by pain    Transfers                   General transfer comment: limited by pain/ongoing BM      Balance                                           ADL either performed or assessed with clinical judgement   ADL Overall ADL's : Needs assistance/impaired                                       General ADL Comments: Significantly functionally limited by increased pain with mobility and decreased cognition. Requires +2 MAX A for rolling side to side in bed. Incontinent of bowel during session and requires TOTAL A for bed level peri-care with RN in to assist with peri care 2/2 foely catheter.     Vision Baseline Vision/History: 1 Wears glasses Ability to See in Adequate Light: 1 Impaired       Perception         Praxis         Pertinent Vitals/Pain Pain  Assessment Pain Assessment: Faces Faces Pain Scale: Hurts whole lot Pain Location: back Pain Descriptors / Indicators: Discomfort, Grimacing, Moaning, Guarding, Sore Pain Intervention(s): Limited activity within patient's tolerance, Monitored during session, Premedicated before session, Patient requesting pain meds-RN notified, Repositioned     Extremity/Trunk Assessment Upper Extremity Assessment Upper Extremity Assessment: Generalized weakness (difficulty lifting against gravity. Pt moaning in pain with even minimal movements of her arms or legs.)   Lower Extremity Assessment Lower Extremity  Assessment: Generalized weakness       Communication Communication Communication: No apparent difficulties   Cognition Arousal: Alert Behavior During Therapy: Flat affect Cognition: No family/caregiver present to determine baseline             OT - Cognition Comments: Oriented to name and place only                 Following commands: Impaired Following commands impaired: Follows one step commands with increased time     Cueing  General Comments   Cueing Techniques: Verbal cues;Gestural cues;Tactile cues      Exercises     Shoulder Instructions      Home Living Family/patient expects to be discharged to:: Private residence Living Arrangements: Other relatives (Son in Social worker) Available Help at Discharge: Available PRN/intermittently Type of Home: House Home Access: Stairs to enter [employer] of Steps: 4 Entrance Stairs-Rails: Left;Right Home Layout: One level     Bathroom Shower/Tub: Tub/shower unit;Walk-in shower         Home Equipment: Agricultural consultant (2 wheels);Shower seat - built in   Additional Comments: pt reported living with SIL, having a walker with no wheels, ambulatory in home with RW. no family present to confirm baseline. per chart review pt ambulatory with PT 09/26/2023      Prior Functioning/Environment Prior Level of Function : Patient poor historian/Family not available                    OT Problem List: Decreased strength;Decreased coordination;Decreased range of motion;Decreased activity tolerance;Decreased safety awareness;Impaired balance (sitting and/or standing);Decreased knowledge of use of DME or AE;Impaired UE functional use;Pain   OT Treatment/Interventions: Self-care/ADL training;Therapeutic exercise;Therapeutic activities;DME and/or AE instruction;Patient/family education;Balance training;Cognitive remediation/compensation      OT Goals(Current goals can be found in the care plan section)   Acute  Rehab OT Goals Patient Stated Goal: To feel better/have less pain OT Goal Formulation: With patient Time For Goal Achievement: 11/16/23 Potential to Achieve Goals: Good ADL Goals Pt Will Perform Grooming: sitting;with set-up;with supervision Pt Will Perform Lower Body Dressing: with min assist;sit to/from stand;sitting/lateral leans;with adaptive equipment Pt Will Transfer to Toilet: ambulating;bedside commode;with contact guard assist   OT Frequency:  Min 2X/week    Co-evaluation              AM-PAC OT 6 Clicks Daily Activity     Outcome Measure Help from another person eating meals?: A Little (on Dys 3 diet per SLP) Help from another person taking care of personal grooming?: A Lot Help from another person toileting, which includes using toliet, bedpan, or urinal?: Total Help from another person bathing (including washing, rinsing, drying)?: A Lot Help from another person to put on and taking off regular upper body clothing?: A Lot Help from another person to put on and taking off regular lower body clothing?: Total 6 Click Score: 11   End of Session    Activity Tolerance: Patient limited by pain Patient left: in bed;with call bell/phone within reach  OT Visit Diagnosis: Other abnormalities of gait and mobility (R26.89);Muscle weakness (generalized) (M62.81);Pain Pain - Right/Left:  (both) Pain - part of body: Knee;Leg;Ankle and joints of foot                Time: 8566-8545 OT Time Calculation (min): 21 min Charges:  OT General Charges $OT Visit: 1 Visit OT Evaluation $OT Eval Moderate Complexity: 1 Mod  Jhonny Pelton, M.S., OTR/L 11/02/23, 4:04 PM

## 2023-11-02 NOTE — Progress Notes (Signed)
 [address] [facility]  ROUNDING NOTE   Subjective:   Patient seen laying in bed in ICU Drowsy but alert for short periods.  To follow simple commands.  NG tube in place.  Speech therapist in the room evaluating. 3L [place] Foley catheter-1L urine output IVF Sodium bicarb  Objective:  Vital signs in last 24 hours:  Temp:  [97.6 F (36.4 C)-100.8 F (38.2 C)] 98.8 F (37.1 C) (09/13 1200) Pulse Rate:  [87-105] 98 (09/13 1200) Resp:  [7-21] 21 (09/13 1200) BP: (96-125)/(47-71) 125/62 (09/13 1200) SpO2:  [95 %-100 %] 100 % (09/13 1200) Weight:  [71.7 kg] 71.7 kg (09/13 0500)  Weight change: 7.5 kg Filed Weights   10/30/23 1925 10/31/23 1346 11/02/23 0500  Weight: 70.3 kg 64.2 kg 71.7 kg    Intake/Output: I/O last 3 completed shifts: In: 3967.3 [I.V.:2532.7; NG/GT:741.8; IV Piggyback:692.8] Out: 2385 [Urine:2385]   Intake/Output this shift:  Total I/O In: 2248.8 [I.V.:1748.7; NG/GT:400.1; IV Piggyback:100] Out: 325 [Urine:325]  Physical Exam: General: Ill appearing  Head: Normocephalic, atraumatic. Dry oral mucosal membranes  Eyes: Anicteric  Neck: Supple  Lungs:  Clear to auscultation  Heart: Regular rate and rhythm  Abdomen:  Soft, nontender  Extremities:  + peripheral edema.  Neurologic: Awake, alert, conversant  Skin: BLE erythema        Basic Metabolic Panel: Recent Labs  Lab 10/31/23 0610 10/31/23 1632 10/31/23 1938 11/01/23 0352 11/02/23 0400  NA 131* 129* 129* 131* 132*  K 5.2* 5.0 4.7 4.5 3.6  CL 105 102 102 102 100  CO2 18* 16* 17* 18* 24  GLUCOSE 62* 182* 206* 217* 131*  BUN 40* 47* 47* 51* 48*  CREATININE 3.29* 3.47* 3.28* 3.51* 2.97*  CALCIUM  7.2* 7.0* 7.0* 7.2* 6.9*  MG  --   --   --  1.6* 2.0  PHOS  --   --   --  5.3* 3.9    Liver Function Tests: Recent Labs  Lab 10/30/23 1928 11/01/23 0352 11/02/23 0400  AST 25  --   --   ALT 9  --   --   ALKPHOS 123  --   --   BILITOT 1.2  --   --   PROT 5.7*  --   --   ALBUMIN  2.0*  <1.5* 1.5*   No results for input(s): LIPASE, AMYLASE in the last 168 hours. No results for input(s): AMMONIA in the last 168 hours.  CBC: Recent Labs  Lab 10/30/23 1928 10/31/23 0610 10/31/23 1629 11/01/23 0352 11/02/23 0400 11/02/23 1215  WBC 10.1 7.7 17.1* 15.6* 4.4 4.4  NEUTROABS 8.9*  --   --   --   --  3.7  HGB 11.8* 9.6* 9.8* 9.4* 7.2* 8.0*  HCT 36.9 28.9* 29.7* 27.4* 21.2* 23.1*  MCV 97.4 96.3 96.4 93.5 92.2 91.7  PLT 367 173 184 146* 67* 61*    Cardiac Enzymes: Recent Labs  Lab 10/30/23 2213  CKTOTAL 38    BNP: Invalid input(s): POCBNP  CBG: Recent Labs  Lab 11/01/23 2321 11/02/23 0400 11/02/23 0714 11/02/23 0800 11/02/23 1124  GLUCAP 181* 129* 62* 151* 171*    Microbiology: Results for orders placed or performed during the hospital encounter of 10/30/23  Blood culture (routine x 2)     Status: Abnormal   Collection Time: 10/30/23  7:28 PM   Specimen: BLOOD  Result Value Ref Range Status   Specimen Description   Final    BLOOD LEFT ANTECUBITAL Performed at [facility], [address] [place], [place] [address]    Special Requests   Final    BLOOD Blood Culture adequate volume Performed at [facility], [address] [place], [place] [address]    Culture  Setup Time   Final    GRAM NEGATIVE RODS IN BOTH AEROBIC AND ANAEROBIC BOTTLES CRITICAL RESULT CALLED TO, READ BACK BY AND VERIFIED WITH: ESTILL LUTES PHARMD [id number] 10/31/23 HNM GRAM STAIN REVIEWED-AGREE WITH RESULT DRT Performed at [facility], [address], [place], [place] [address]    Culture SERRATIA MARCESCENS (A)  Final   Report Status 11/02/2023 FINAL  Final   Organism ID, Bacteria SERRATIA MARCESCENS  Final      Susceptibility   Serratia marcescens - MIC*    CEFEPIME  <=0.12 SENSITIVE Sensitive     ERTAPENEM <=0.12 SENSITIVE Sensitive     CEFTRIAXONE  <=0.25 SENSITIVE Sensitive     CIPROFLOXACIN  <=0.06 SENSITIVE Sensitive     GENTAMICIN  <=1 SENSITIVE Sensitive     MEROPENEM  <=0.25 SENSITIVE Sensitive     TRIMETH /SULFA  <=20 SENSITIVE Sensitive     * SERRATIA MARCESCENS  Blood Culture ID Panel (Reflexed)     Status: Abnormal   Collection Time: 10/30/23  7:28 PM  Result Value Ref Range Status   Enterococcus faecalis NOT DETECTED NOT DETECTED Final   Enterococcus Faecium NOT DETECTED NOT DETECTED Final   Listeria monocytogenes NOT DETECTED NOT DETECTED Final   Staphylococcus species NOT DETECTED NOT DETECTED Final   Staphylococcus aureus (BCID) NOT DETECTED NOT DETECTED Final   Staphylococcus epidermidis NOT DETECTED NOT DETECTED Final   Staphylococcus lugdunensis NOT DETECTED NOT DETECTED Final   Streptococcus species NOT DETECTED NOT DETECTED Final   Streptococcus agalactiae NOT DETECTED NOT DETECTED Final   Streptococcus pneumoniae NOT DETECTED NOT DETECTED Final   Streptococcus pyogenes NOT DETECTED NOT DETECTED Final   A.calcoaceticus-baumannii NOT DETECTED NOT DETECTED Final   Bacteroides fragilis NOT DETECTED NOT DETECTED Final   Enterobacterales DETECTED (A) NOT DETECTED Final    Comment: Enterobacterales represent a large order of gram negative bacteria, not a single organism. CRITICAL RESULT CALLED TO, READ BACK BY AND VERIFIED WITH: SHEEMA HALLAJI PHARMD [id number] 10/31/23 HNM    Enterobacter cloacae complex NOT DETECTED NOT DETECTED Final   Escherichia coli NOT DETECTED NOT DETECTED Final   Klebsiella aerogenes NOT DETECTED NOT DETECTED Final   Klebsiella oxytoca NOT DETECTED NOT DETECTED Final   Klebsiella pneumoniae NOT DETECTED NOT DETECTED Final   Proteus species NOT DETECTED NOT DETECTED Final   Salmonella species NOT DETECTED NOT DETECTED Final   Serratia marcescens DETECTED (A) NOT DETECTED Final    Comment: CRITICAL RESULT CALLED TO, READ BACK BY AND VERIFIED WITH: ESTILL LUTES PHARMD [id number] 10/31/23 HNM    Haemophilus influenzae NOT DETECTED NOT DETECTED Final   Neisseria meningitidis NOT DETECTED NOT  DETECTED Final   Pseudomonas aeruginosa NOT DETECTED NOT DETECTED Final   Stenotrophomonas maltophilia NOT DETECTED NOT DETECTED Final   Candida albicans NOT DETECTED NOT DETECTED Final   Candida auris NOT DETECTED NOT DETECTED Final   Candida glabrata NOT DETECTED NOT DETECTED Final   Candida krusei NOT DETECTED NOT DETECTED Final   Candida parapsilosis NOT DETECTED NOT DETECTED Final   Candida tropicalis NOT DETECTED NOT DETECTED Final   Cryptococcus neoformans/gattii NOT DETECTED NOT DETECTED Final   CTX-M ESBL NOT DETECTED NOT DETECTED Final   Carbapenem resistance IMP NOT DETECTED NOT DETECTED Final   Carbapenem resistance KPC NOT DETECTED NOT DETECTED Final  Carbapenem resistance NDM NOT DETECTED NOT DETECTED Final   Carbapenem resist OXA 48 LIKE NOT DETECTED NOT DETECTED Final   Carbapenem resistance VIM NOT DETECTED NOT DETECTED Final    Comment: Performed at [facility], [address] [place], [place] [address]  Blood culture (routine x 2)     Status: Abnormal   Collection Time: 10/30/23  7:55 PM   Specimen: BLOOD  Result Value Ref Range Status   Specimen Description   Final    BLOOD BLOOD RIGHT ARM Performed at [facility], [address], [place], [place] [address]    Special Requests   Final    BOTTLES DRAWN AEROBIC AND ANAEROBIC Blood Culture results may not be optimal due to an inadequate volume of blood received in culture bottles Performed at [facility], [address] [place], [place] [address]    Culture  Setup Time   Final    GRAM NEGATIVE RODS IN BOTH AEROBIC AND ANAEROBIC BOTTLES CRITICAL VALUE NOTED.  VALUE IS CONSISTENT WITH PREVIOUSLY REPORTED AND CALLED VALUE. GRAM STAIN REVIEWED-AGREE WITH RESULT DRT    Culture (A)  Final    SERRATIA MARCESCENS SUSCEPTIBILITIES PERFORMED ON PREVIOUS CULTURE WITHIN THE LAST 5 DAYS. Performed at [facility], [address], [place], [place] [address]    Report Status  11/02/2023 FINAL  Final  Resp panel by RT-PCR (RSV, Flu A&B, Covid) Anterior Nasal Swab     Status: None   Collection Time: 10/30/23  8:01 PM   Specimen: Anterior Nasal Swab  Result Value Ref Range Status   SARS Coronavirus 2 by RT PCR NEGATIVE NEGATIVE Final    Comment: (NOTE) SARS-CoV-2 target nucleic acids are NOT DETECTED.  The SARS-CoV-2 RNA is generally detectable in upper respiratory specimens during the acute phase of infection. The lowest concentration of SARS-CoV-2 viral copies this assay can detect is 138 copies/mL. A negative result does not preclude SARS-Cov-2 infection and should not be used as the sole basis for treatment or other patient management decisions. A negative result may occur with  improper specimen collection/handling, submission of specimen other than nasopharyngeal swab, presence of viral mutation(s) within the areas targeted by this assay, and inadequate number of viral copies(<138 copies/mL). A negative result must be combined with clinical observations, patient history, and epidemiological information. The expected result is Negative.  Fact Sheet for Patients:  BloggerCourse.com  Fact Sheet for Healthcare Providers:  SeriousBroker.it  This test is no t yet approved or cleared by the [place]  FDA and  has been authorized for detection and/or diagnosis of SARS-CoV-2 by FDA under an Emergency Use Authorization (EUA). This EUA will remain  in effect (meaning this test can be used) for the duration of the COVID-19 declaration under Section 564(b)(1) of the Act, 21 U.S.C.section 360bbb-3(b)(1), unless the authorization is terminated  or revoked sooner.       Influenza A by PCR NEGATIVE NEGATIVE Final   Influenza B by PCR NEGATIVE NEGATIVE Final    Comment: (NOTE) The Xpert Xpress SARS-CoV-2/FLU/RSV plus assay is intended as an aid in the diagnosis of influenza from Nasopharyngeal swab specimens  and should not be used as a sole basis for treatment. Nasal washings and aspirates are unacceptable for Xpert Xpress SARS-CoV-2/FLU/RSV testing.  Fact Sheet for Patients: BloggerCourse.com  Fact Sheet for Healthcare Providers: SeriousBroker.it  This test is not yet approved or cleared by the [place]  FDA and has been authorized for detection and/or diagnosis of SARS-CoV-2 by FDA  under an Emergency Use Authorization (EUA). This EUA will remain in effect (meaning this test can be used) for the duration of the COVID-19 declaration under Section 564(b)(1) of the Act, 21 U.S.C. section 360bbb-3(b)(1), unless the authorization is terminated or revoked.     Resp Syncytial Virus by PCR NEGATIVE NEGATIVE Final    Comment: (NOTE) Fact Sheet for Patients: BloggerCourse.com  Fact Sheet for Healthcare Providers: SeriousBroker.it  This test is not yet approved or cleared by the [place]  FDA and has been authorized for detection and/or diagnosis of SARS-CoV-2 by FDA under an Emergency Use Authorization (EUA). This EUA will remain in effect (meaning this test can be used) for the duration of the COVID-19 declaration under Section 564(b)(1) of the Act, 21 U.S.C. section 360bbb-3(b)(1), unless the authorization is terminated or revoked.  Performed at [facility], [address] [place], [place] [address]   MRSA Next Gen by PCR, Nasal     Status: None   Collection Time: 10/31/23  2:05 PM   Specimen: Nasal Mucosa; Nasal Swab  Result Value Ref Range Status   MRSA by PCR Next Gen NOT DETECTED NOT DETECTED Final    Comment: (NOTE) The GeneXpert MRSA Assay (FDA approved for NASAL specimens only), is one component of a comprehensive MRSA colonization surveillance program. It is not intended to diagnose MRSA infection nor to guide or monitor treatment for MRSA  infections. Test performance is not FDA approved in patients less than 79 years old. Performed at [facility], [address] [place], [place] [address]     Coagulation Studies: Recent Labs    10/30/23 1928 10/31/23 0610  LABPROT 17.4* 21.2*  INR 1.3* 1.7*    Urinalysis: Recent Labs    11/01/23 0050  COLORURINE YELLOW*  LABSPEC 1.011  PHURINE 5.0  GLUCOSEU NEGATIVE  HGBUR NEGATIVE  BILIRUBINUR NEGATIVE  KETONESUR NEGATIVE  PROTEINUR NEGATIVE  NITRITE NEGATIVE  LEUKOCYTESUR MODERATE*      Imaging: US  ARTERIAL ABI (SCREENING LOWER EXTREMITY) Result Date: 11/02/2023 CLINICAL DATA:  Peripheral arterial disease EXAM: NONINVASIVE PHYSIOLOGIC VASCULAR STUDY OF BILATERAL LOWER EXTREMITIES TECHNIQUE: Evaluation of both lower extremities were performed at rest, including calculation of ankle-brachial indices with single level Doppler, pressure and pulse volume recording. COMPARISON:  None Available. FINDINGS: Right ABI:  1.2 Left ABI:  1.1 Right Lower Extremity:  Mildly abnormal biphasic arterial waveforms. Left Lower Extremity:  Mildly abnormal biphasic arterial waveforms. 1.0-1.4 Normal IMPRESSION: 1. Mildly abnormal arterial waveforms bilaterally suggests at least an element of underlying peripheral arterial disease. 2. However, the bilateral ankle-brachial indices remain within normal limits and are symmetric. Electronically Signed   By: Wilkie Lent M.D.   On: 11/02/2023 08:09   US  RENAL Result Date: 11/01/2023 CLINICAL DATA:  [id number] Acute kidney failure (HCC) [id number] EXAM: RENAL / URINARY TRACT ULTRASOUND COMPLETE COMPARISON:  CT abdomen pelvis 07/06/2021 FINDINGS: Right Kidney: Renal measurements: 10.1 x 4.9 x 5 cm = volume: 130.2 mL. Echogenicity within normal limits. No mass or hydronephrosis visualized. Left Kidney: Renal measurements: 10.7 x 5.4 x 4.6 cm = volume: 140.3 mL. Echogenicity within normal limits. Left nephrolithiasis measuring up to 5 mm. No mass or  hydronephrosis visualized. Bladder: Foley catheter inflated balloon terminating within the urinary bladder lumen. Urinary bladder decompressed with urinary bladder wall thickening. Other: Increased hepatic parenchyma echogenicity. At least small volume simple free fluid ascites. IMPRESSION: 1. Urinary bladder wall thickening. May be due to under distension in the setting of a Foley. Correlate with urinalysis for  infection. 2. Nonobstructive left nephrolithiasis. 3. Hepatic steatosis. 4. At least small volume simple free fluid ascites. Electronically Signed   By: Morgane  Naveau M.D.   On: 11/01/2023 17:25   DG Abd 1 View Result Date: 11/01/2023 CLINICAL DATA:  Nasogastric tube placement. EXAM: ABDOMEN - 1 VIEW COMPARISON:  Abdomen and pelvis CT dated 07/06/2021. FINDINGS: Normal bowel-gas pattern. Nasogastric tube tip in the proximal to mid stomach and side hole in the proximal stomach. Mild scoliosis and mild lumbar spine degenerative changes. Extensive atheromatous arterial calcifications without visible aneurysm. IMPRESSION: Nasogastric tube tip in the proximal to mid stomach. Electronically Signed   By: Elspeth Bathe M.D.   On: 11/01/2023 12:50   US  Venous Img Lower Bilateral (DVT) Result Date: 11/01/2023 CLINICAL DATA:  Bilateral lower extremity edema EXAM: BILATERAL LOWER EXTREMITY VENOUS DOPPLER ULTRASOUND TECHNIQUE: Gray-scale sonography with graded compression, as well as color Doppler and duplex ultrasound were performed to evaluate the lower extremity deep venous systems from the level of the common femoral vein and including the common femoral, femoral, profunda femoral, popliteal and calf veins including the posterior tibial, peroneal and gastrocnemius veins when visible. The superficial great saphenous vein was also interrogated. Spectral Doppler was utilized to evaluate flow at rest and with distal augmentation maneuvers in the common femoral, femoral and popliteal veins. COMPARISON:  None  Available. FINDINGS: RIGHT LOWER EXTREMITY Common Femoral Vein: No evidence of thrombus. Normal compressibility, respiratory phasicity and response to augmentation. Saphenofemoral Junction: No evidence of thrombus. Normal compressibility and flow on color Doppler imaging. Profunda Femoral Vein: No evidence of thrombus. Normal compressibility and flow on color Doppler imaging. Femoral Vein: No evidence of thrombus. Normal compressibility, respiratory phasicity and response to augmentation. Popliteal Vein: No evidence of thrombus. Normal compressibility, respiratory phasicity and response to augmentation. Calf Veins: No evidence of thrombus. Normal compressibility and flow on color Doppler imaging. Superficial Great Saphenous Vein: No evidence of thrombus. Normal compressibility. Venous Reflux:  None. Other Findings:  Extensive subcutaneous edema. LEFT LOWER EXTREMITY Common Femoral Vein: Unable to visualize due to obscuration from overlying dressing. Saphenofemoral Junction: Unable to visualize due to obscuration from overlying dressing. Profunda Femoral Vein: No evidence of thrombus. Normal compressibility and flow on color Doppler imaging. Femoral Vein: No evidence of thrombus. Normal compressibility, respiratory phasicity and response to augmentation. Popliteal Vein: No evidence of thrombus. Normal compressibility, respiratory phasicity and response to augmentation. Calf Veins: No evidence of thrombus. Normal compressibility and flow on color Doppler imaging. Superficial Great Saphenous Vein: No evidence of thrombus. Normal compressibility. Venous Reflux:  None. Other Findings:  Extensive subcutaneous edema. IMPRESSION: 1. No evidence of deep venous thrombosis in either lower extremity. Please note that the left common femoral vein was not well seen due to overlying bandaging material. 2. Extensive subcutaneous edema bilaterally. Differential considerations include volume overload versus cellulitis. 3. Enlarged  right superficial inguinal lymph node measuring up to 1.2 cm in short axis. Given known history of right lower extremity infection, reactive lymphadenopathy is strongly favored. Electronically Signed   By: Wilkie Lent M.D.   On: 11/01/2023 08:54   CT TIBIA FIBULA RIGHT WO CONTRAST Result Date: 10/31/2023 CLINICAL DATA:  Swelling.  Osteomyelitis suspected. EXAM: CT OF THE LOWER RIGHT EXTREMITY WITHOUT CONTRAST TECHNIQUE: Multidetector CT imaging of the right lower extremity was performed according to the standard protocol. RADIATION DOSE REDUCTION: This exam was performed according to the departmental dose-optimization program which includes automated exposure control, adjustment of the mA and/or kV according to patient size  and/or use of iterative reconstruction technique. COMPARISON:  None Available. FINDINGS: Bones/Joint/Cartilage No acute bony abnormality. Specifically, no fracture, subluxation, or dislocation. No bone destruction to suggest osteomyelitis. Ligaments Suboptimally assessed by CT. Muscles and Tendons Negative Soft tissues Stranding throughout the subcutaneous soft tissues in the left lower extremity. IMPRESSION: Diffuse soft tissue swelling. No acute bony abnormality. No evidence of osteomyelitis. Electronically Signed   By: Franky Crease M.D.   On: 10/31/2023 21:21   CT FOOT RIGHT WO CONTRAST Result Date: 10/31/2023 CLINICAL DATA:  Swelling.  Osteomyelitis suspected. EXAM: CT OF THE LOWER RIGHT EXTREMITY WITHOUT CONTRAST TECHNIQUE: Multidetector CT imaging of the right lower extremity was performed according to the standard protocol. RADIATION DOSE REDUCTION: This exam was performed according to the departmental dose-optimization program which includes automated exposure control, adjustment of the mA and/or kV according to patient size and/or use of iterative reconstruction technique. COMPARISON:  None Available. FINDINGS: Bones/Joint/Cartilage No acute bony abnormality. Specifically, no  fracture, subluxation, or dislocation. No bone destruction to suggest osteomyelitis. Ligaments Suboptimally assessed by CT. Muscles and Tendons Negative Soft tissues Stranding throughout the subcutaneous soft tissues in the left lower extremity. IMPRESSION: Diffuse soft tissue swelling. No acute bony abnormality. No evidence of osteomyelitis. Electronically Signed   By: Franky Crease M.D.   On: 10/31/2023 21:21   CT TIBIA FIBULA LEFT WO CONTRAST Result Date: 10/31/2023 CLINICAL DATA:  Swelling.  Osteomyelitis suspected. EXAM: CT OF THE LOWER LEFT EXTREMITY WITHOUT CONTRAST TECHNIQUE: Multidetector CT imaging of the lower left extremity was performed according to the standard protocol. RADIATION DOSE REDUCTION: This exam was performed according to the departmental dose-optimization program which includes automated exposure control, adjustment of the mA and/or kV according to patient size and/or use of iterative reconstruction technique. COMPARISON:  None Available. FINDINGS: Bones/Joint/Cartilage No acute bony abnormality. Specifically, no fracture, subluxation, or dislocation. No bone destruction to suggest osteomyelitis. Ligaments Suboptimally assessed by CT. Muscles and Tendons Negative Soft tissues Stranding throughout the subcutaneous soft tissues in the left lower extremity. IMPRESSION: Diffuse soft tissue swelling. No acute bony abnormality. No evidence of osteomyelitis. Electronically Signed   By: Franky Crease M.D.   On: 10/31/2023 21:20   CT FOOT LEFT WO CONTRAST Result Date: 10/31/2023 CLINICAL DATA:  Swelling.  Osteomyelitis suspected. EXAM: CT OF THE LOWER LEFT EXTREMITY WITHOUT CONTRAST TECHNIQUE: Multidetector CT imaging of the lower left extremity was performed according to the standard protocol. RADIATION DOSE REDUCTION: This exam was performed according to the departmental dose-optimization program which includes automated exposure control, adjustment of the mA and/or kV according to patient  size and/or use of iterative reconstruction technique. COMPARISON:  None Available. FINDINGS: Bones/Joint/Cartilage No acute bony abnormality. Specifically, no fracture, subluxation, or dislocation. No bone destruction to suggest osteomyelitis. Ligaments Suboptimally assessed by CT. Muscles and Tendons Negative Soft tissues Stranding throughout the subcutaneous soft tissues in the left lower extremity. IMPRESSION: Diffuse soft tissue swelling. No acute bony abnormality. No evidence of osteomyelitis. Electronically Signed   By: Franky Crease M.D.   On: 10/31/2023 21:20     Medications:    cefTRIAXone  (ROCEPHIN )  IV     feeding supplement (VITAL AF 1.2 CAL) 35 mL/hr at 11/02/23 1243   sodium bicarbonate  150 mEq in dextrose  5 % 1,150 mL infusion 100 mL/hr at 11/02/23 1243    budesonide  (PULMICORT ) nebulizer solution  0.5 mg Nebulization BID   Chlorhexidine  Gluconate Cloth  6 each Topical Daily   free water   30 mL Per Tube Q4H  heparin  injection (subcutaneous)  5,000 Units Subcutaneous Q8H   insulin  aspart  0-15 Units Subcutaneous Q4H   ipratropium-albuterol   3 mL Nebulization BID   leptospermum manuka honey  1 Application Topical Daily   lidocaine   1 patch Transdermal Q24H   midodrine   10 mg Per NG tube TID WC   multivitamin with minerals  1 tablet Per Tube Daily   pantoprazole  (PROTONIX ) IV  40 mg Intravenous Q24H   thiamine   100 mg Per Tube Daily   acetaminophen , acetaminophen , [DISCONTINUED] insulin  aspart **AND** dextrose  **AND** POCT CBG monitoring, ipratropium-albuterol , morphine  injection, [DISCONTINUED] ondansetron  **OR** ondansetron  (ZOFRAN ) IV  Assessment/ Plan:  Ms. EVEREST HACKING is a 63 y.o.  female with past medical history including COPD, type 2 diabetes, anxiety, depression, GERD and hypertension, who was admitted to [facility] on 10/30/2023 for Hyperkalemia [E87.5] Hyponatremia [E87.1] Weakness [R53.1] Leg swelling [M79.89] Wound infection [T14.8XXA, L08.9] AKI (acute  kidney injury) (HCC) [N17.9] Sepsis (HCC) [A41.9] Sepsis due to cellulitis (HCC) [L03.90, A41.9] Sepsis, due to unspecified organism, unspecified whether acute organ dysfunction present (HCC) [A41.9]   Acute kidney injury with hyperkalemia/hyponatremia likely secondary to multiple factors: infectious process (gram-negative sepsis), hypotension and volume depletion. Normal renal function noted last month. Found down by son. CK 58. Renal US -nonobstructive left kidney stone. Creatinine and urine output have improved today. Foley catheter in place.  Continue IVF and blood support. No acute indication for dialysis.    Lab Results  Component Value Date   CREATININE 2.97 (H) 11/02/2023   CREATININE 3.51 (H) 11/01/2023   CREATININE 3.28 (H) 10/31/2023    Intake/Output Summary (Last 24 hours) at 11/02/2023 1350 Last data filed at 11/02/2023 1243 Gross per 24 hour  Intake 4092.84 ml  Output 1110 ml  Net 2982.84 ml   2. Acute metabolic acidosis, S bicarb 13->24.  Slowly improving with IVF.   3. Anemia of chronic kidney disease Lab Results  Component Value Date   HGB 8.0 (L) 11/02/2023    Hemoglobin remains low.  Monitor for bleeding.  4. Sepsis secondary to cellulitis, bilateral lower extremities.  Gram-negative sepsis.  Antibiotics as per primary team.   LOS: 3 Yoneko Talerico 9/13/20251:50 PM

## 2023-11-03 ENCOUNTER — Inpatient Hospital Stay

## 2023-11-03 LAB — BASIC METABOLIC PANEL WITH GFR
Anion gap: 9 (ref 5–15)
BUN: 38 mg/dL — ABNORMAL HIGH (ref 8–23)
CO2: 32 mmol/L (ref 22–32)
Calcium: 7 mg/dL — ABNORMAL LOW (ref 8.9–10.3)
Chloride: 93 mmol/L — ABNORMAL LOW (ref 98–111)
Creatinine, Ser: 1.94 mg/dL — ABNORMAL HIGH (ref 0.44–1.00)
GFR, Estimated: 29 mL/min — ABNORMAL LOW (ref >60)
Glucose, Bld: 168 mg/dL — ABNORMAL HIGH (ref 70–99)
Potassium: 3.4 mmol/L — ABNORMAL LOW (ref 3.5–5.1)
Sodium: 134 mmol/L — ABNORMAL LOW (ref 135–145)

## 2023-11-03 LAB — GLUCOSE, CAPILLARY
Glucose-Capillary: 103 mg/dL — ABNORMAL HIGH (ref 70–99)
Glucose-Capillary: 97 mg/dL (ref 70–99)
Glucose-Capillary: 94 mg/dL (ref 70–99)
Glucose-Capillary: 123 mg/dL — ABNORMAL HIGH (ref 70–99)
Glucose-Capillary: 127 mg/dL — ABNORMAL HIGH (ref 70–99)
Glucose-Capillary: 67 mg/dL — ABNORMAL LOW (ref 70–99)
Glucose-Capillary: 94 mg/dL (ref 70–99)
Glucose-Capillary: 144 mg/dL — ABNORMAL HIGH (ref 70–99)

## 2023-11-03 LAB — RENAL FUNCTION PANEL
Albumin: <1.5 g/dL — ABNORMAL LOW (ref 3.5–5.0)
Anion gap: 10 (ref 5–15)
BUN: 41 mg/dL — ABNORMAL HIGH (ref 8–23)
CO2: 30 mmol/L (ref 22–32)
Calcium: 6.6 mg/dL — ABNORMAL LOW (ref 8.9–10.3)
Chloride: 96 mmol/L — ABNORMAL LOW (ref 98–111)
Creatinine, Ser: 2.15 mg/dL — ABNORMAL HIGH (ref 0.44–1.00)
GFR, Estimated: 25 mL/min — ABNORMAL LOW (ref >60)
Glucose, Bld: 101 mg/dL — ABNORMAL HIGH (ref 70–99)
Phosphorus: 2.6 mg/dL (ref 2.5–4.6)
Potassium: 3 mmol/L — ABNORMAL LOW (ref 3.5–5.1)
Sodium: 136 mmol/L (ref 135–145)

## 2023-11-03 LAB — CBC
HCT: 23 % — ABNORMAL LOW (ref 36.0–46.0)
Hemoglobin: 8.1 g/dL — ABNORMAL LOW (ref 12.0–15.0)
MCH: 32.1 pg (ref 26.0–34.0)
MCHC: 35.2 g/dL (ref 30.0–36.0)
MCV: 91.3 fL (ref 80.0–100.0)
Platelets: 57 K/uL — ABNORMAL LOW (ref 150–400)
RBC: 2.52 MIL/uL — ABNORMAL LOW (ref 3.87–5.11)
RDW: 14.5 % (ref 11.5–15.5)
WBC: 5.4 K/uL (ref 4.0–10.5)
nRBC: 0 % (ref 0.0–0.2)

## 2023-11-03 LAB — MAGNESIUM
Magnesium: 1.8 mg/dL (ref 1.7–2.4)
Magnesium: 1.7 mg/dL (ref 1.7–2.4)

## 2023-11-03 LAB — FIBRINOGEN: Fibrinogen: 296 mg/dL (ref 210–475)

## 2023-11-03 MED ORDER — ALBUMIN HUMAN 25 % IV SOLN
12.5000 g | Freq: Once | INTRAVENOUS | Status: AC
  Administered 2023-11-03: 12.5 g via INTRAVENOUS
  Filled 2023-11-03: qty 50

## 2023-11-03 MED ORDER — QUETIAPINE FUMARATE 25 MG PO TABS
25.0000 mg | ORAL_TABLET | Freq: Every day | ORAL | Status: DC
  Administered 2023-11-04 – 2023-11-16 (×14): 25 mg via ORAL
  Filled 2023-11-03 (×14): qty 1

## 2023-11-03 MED ORDER — ALBUMIN HUMAN 25 % IV SOLN
12.5000 g | Freq: Two times a day (BID) | INTRAVENOUS | Status: DC
  Administered 2023-11-03 – 2023-11-04 (×4): 12.5 g via INTRAVENOUS
  Filled 2023-11-03 (×5): qty 50

## 2023-11-03 MED ORDER — INSULIN ASPART 100 UNIT/ML IJ SOLN
0.0000 [IU] | INTRAMUSCULAR | Status: DC
  Administered 2023-11-03: 1 [IU] via SUBCUTANEOUS
  Filled 2023-11-03: qty 1

## 2023-11-03 MED ORDER — CALCIUM GLUCONATE-NACL 2-0.675 GM/100ML-% IV SOLN
2.0000 g | Freq: Once | INTRAVENOUS | Status: AC
  Administered 2023-11-03: 2000 mg via INTRAVENOUS
  Filled 2023-11-03: qty 100

## 2023-11-03 MED ORDER — ORAL CARE MOUTH RINSE: 15.0000 mL | OROMUCOSAL | Status: DC | PRN

## 2023-11-03 MED ORDER — POTASSIUM CHLORIDE 10 MEQ/50ML IV SOLN
10.0000 meq | INTRAVENOUS | Status: AC
  Administered 2023-11-03 (×4): 10 meq via INTRAVENOUS
  Filled 2023-11-03 (×4): qty 50

## 2023-11-03 NOTE — Progress Notes (Signed)
 [address] [facility]  ROUNDING NOTE   Subjective:   Patient seen laying in bed in ICU NG tube is in place. Foley bag has more urine present. Serum creatinine is improved today. Patient is alert and sitting up in the bed and says that she feels better compared to yesterday.  Objective:  Vital signs in last 24 hours:  Temp:  [97.5 F (36.4 C)-99.7 F (37.6 C)] 97.7 F (36.5 C) (09/14 0800) Pulse Rate:  [49-103] 49 (09/14 0900) Resp:  [9-22] 9 (09/14 1100) BP: (108-133)/(48-74) 127/62 (09/14 1100) SpO2:  [65 %-99 %] 65 % (09/14 0900) Weight:  [69.2 kg] 69.2 kg (09/14 0500)  Weight change: -2.5 kg Filed Weights   10/31/23 1346 11/02/23 0500 11/03/23 0500  Weight: 64.2 kg 71.7 kg 69.2 kg    Intake/Output: I/O last 3 completed shifts: In: 5126.1 [P.O.:100; I.V.:3534.9; Other:10; NG/GT:1113.1; IV Piggyback:368.2] Out: 1760 [Urine:1710; Emesis/NG output:50]   Intake/Output this shift:  Total I/O In: 934.3 [I.V.:784.2; IV Piggyback:150.1] Out: 495 [Urine:495]  Physical Exam: General: Ill appearing  Head: Normocephalic, atraumatic. Dry oral mucosal membranes  Eyes: Anicteric  Neck: Supple  Lungs:  Clear to auscultation  Heart: Regular rate and rhythm  Abdomen:  Soft, nontender  Extremities:  + peripheral edema.  Neurologic: Awake, alert, conversant  Skin: BLE erythema        Basic Metabolic Panel: Recent Labs  Lab 10/31/23 1938 11/01/23 0352 11/02/23 0400 11/03/23 0320 11/03/23 1459  NA 129* 131* 132* 136 134*  K 4.7 4.5 3.6 3.0* 3.4*  CL 102 102 100 96* 93*  CO2 17* 18* 24 30 32  GLUCOSE 206* 217* 131* 101* 168*  BUN 47* 51* 48* 41* 38*  CREATININE 3.28* 3.51* 2.97* 2.15* 1.94*  CALCIUM  7.0* 7.2* 6.9* 6.6* 7.0*  MG  --  1.6* 2.0 1.8 1.7  PHOS  --  5.3* 3.9 2.6  --     Liver Function Tests: Recent Labs  Lab 10/30/23 1928 11/01/23 0352 11/02/23 0400 11/03/23 0320  AST 25  --   --   --   ALT 9  --   --   --   ALKPHOS 123  --   --   --    BILITOT 1.2  --   --   --   PROT 5.7*  --   --   --   ALBUMIN  2.0* <1.5* 1.5* <1.5*   No results for input(s): LIPASE, AMYLASE in the last 168 hours. No results for input(s): AMMONIA in the last 168 hours.  CBC: Recent Labs  Lab 10/30/23 1928 10/31/23 0610 10/31/23 1629 11/01/23 0352 11/02/23 0400 11/02/23 1215 11/03/23 0320  WBC 10.1   < > 17.1* 15.6* 4.4 4.4 5.4  NEUTROABS 8.9*  --   --   --   --  3.7  --   HGB 11.8*   < > 9.8* 9.4* 7.2* 8.0* 8.1*  HCT 36.9   < > 29.7* 27.4* 21.2* 23.1* 23.0*  MCV 97.4   < > 96.4 93.5 92.2 91.7 91.3  PLT 367   < > 184 146* 67* 61* 57*   < > = values in this interval not displayed.    Cardiac Enzymes: Recent Labs  Lab 10/30/23 2213  CKTOTAL 38    BNP: Invalid input(s): POCBNP  CBG: Recent Labs  Lab 11/03/23 0328 11/03/23 0352 11/03/23 0718 11/03/23 1103 11/03/23 1514  GLUCAP 103* 97 94 123* 127*    Microbiology: Results for orders placed or performed during the  hospital encounter of 10/30/23  Blood culture (routine x 2)     Status: Abnormal   Collection Time: 10/30/23  7:28 PM   Specimen: BLOOD  Result Value Ref Range Status   Specimen Description   Final    BLOOD LEFT ANTECUBITAL Performed at [facility], [address] [place], [place] [address]    Special Requests   Final    BLOOD Blood Culture adequate volume Performed at [facility], [address] [place], [place] [address]    Culture  Setup Time   Final    GRAM NEGATIVE RODS IN BOTH AEROBIC AND ANAEROBIC BOTTLES CRITICAL RESULT CALLED TO, READ BACK BY AND VERIFIED WITHBETHA ESTILL LUTES PHARMD [id number] 10/31/23 HNM GRAM STAIN REVIEWED-AGREE WITH RESULT DRT Performed at [facility], [address] [place], [place] [address]    Culture SERRATIA MARCESCENS (A)  Final   Report Status 11/02/2023 FINAL  Final   Organism ID, Bacteria SERRATIA MARCESCENS  Final      Susceptibility   Serratia marcescens - MIC*    CEFEPIME   <=0.12 SENSITIVE Sensitive     ERTAPENEM <=0.12 SENSITIVE Sensitive     CEFTRIAXONE  <=0.25 SENSITIVE Sensitive     CIPROFLOXACIN  <=0.06 SENSITIVE Sensitive     GENTAMICIN <=1 SENSITIVE Sensitive     MEROPENEM  <=0.25 SENSITIVE Sensitive     TRIMETH /SULFA  <=20 SENSITIVE Sensitive     * SERRATIA MARCESCENS  Blood Culture ID Panel (Reflexed)     Status: Abnormal   Collection Time: 10/30/23  7:28 PM  Result Value Ref Range Status   Enterococcus faecalis NOT DETECTED NOT DETECTED Final   Enterococcus Faecium NOT DETECTED NOT DETECTED Final   Listeria monocytogenes NOT DETECTED NOT DETECTED Final   Staphylococcus species NOT DETECTED NOT DETECTED Final   Staphylococcus aureus (BCID) NOT DETECTED NOT DETECTED Final   Staphylococcus epidermidis NOT DETECTED NOT DETECTED Final   Staphylococcus lugdunensis NOT DETECTED NOT DETECTED Final   Streptococcus species NOT DETECTED NOT DETECTED Final   Streptococcus agalactiae NOT DETECTED NOT DETECTED Final   Streptococcus pneumoniae NOT DETECTED NOT DETECTED Final   Streptococcus pyogenes NOT DETECTED NOT DETECTED Final   A.calcoaceticus-baumannii NOT DETECTED NOT DETECTED Final   Bacteroides fragilis NOT DETECTED NOT DETECTED Final   Enterobacterales DETECTED (A) NOT DETECTED Final    Comment: Enterobacterales represent a large order of gram negative bacteria, not a single organism. CRITICAL RESULT CALLED TO, READ BACK BY AND VERIFIED WITH: SHEEMA HALLAJI PHARMD [id number] 10/31/23 HNM    Enterobacter cloacae complex NOT DETECTED NOT DETECTED Final   Escherichia coli NOT DETECTED NOT DETECTED Final   Klebsiella aerogenes NOT DETECTED NOT DETECTED Final   Klebsiella oxytoca NOT DETECTED NOT DETECTED Final   Klebsiella pneumoniae NOT DETECTED NOT DETECTED Final   Proteus species NOT DETECTED NOT DETECTED Final   Salmonella species NOT DETECTED NOT DETECTED Final   Serratia marcescens DETECTED (A) NOT DETECTED Final    Comment: CRITICAL RESULT CALLED TO,  READ BACK BY AND VERIFIED WITH: ESTILL LUTES PHARMD [id number] 10/31/23 HNM    Haemophilus influenzae NOT DETECTED NOT DETECTED Final   Neisseria meningitidis NOT DETECTED NOT DETECTED Final   Pseudomonas aeruginosa NOT DETECTED NOT DETECTED Final   Stenotrophomonas maltophilia NOT DETECTED NOT DETECTED Final   Candida albicans NOT DETECTED NOT DETECTED Final   Candida auris NOT DETECTED NOT DETECTED Final   Candida glabrata NOT DETECTED NOT DETECTED Final   Candida krusei NOT DETECTED NOT DETECTED Final   Candida  parapsilosis NOT DETECTED NOT DETECTED Final   Candida tropicalis NOT DETECTED NOT DETECTED Final   Cryptococcus neoformans/gattii NOT DETECTED NOT DETECTED Final   CTX-M ESBL NOT DETECTED NOT DETECTED Final   Carbapenem resistance IMP NOT DETECTED NOT DETECTED Final   Carbapenem resistance KPC NOT DETECTED NOT DETECTED Final   Carbapenem resistance NDM NOT DETECTED NOT DETECTED Final   Carbapenem resist OXA 48 LIKE NOT DETECTED NOT DETECTED Final   Carbapenem resistance VIM NOT DETECTED NOT DETECTED Final    Comment: Performed at [facility], [address] [place], [place] [address]  Blood culture (routine x 2)     Status: Abnormal   Collection Time: 10/30/23  7:55 PM   Specimen: BLOOD  Result Value Ref Range Status   Specimen Description   Final    BLOOD BLOOD RIGHT ARM Performed at [facility], [address] [place], [place] [address]    Special Requests   Final    BOTTLES DRAWN AEROBIC AND ANAEROBIC Blood Culture results may not be optimal due to an inadequate volume of blood received in culture bottles Performed at [facility], [address] [place], [place] [address]    Culture  Setup Time   Final    GRAM NEGATIVE RODS IN BOTH AEROBIC AND ANAEROBIC BOTTLES CRITICAL VALUE NOTED.  VALUE IS CONSISTENT WITH PREVIOUSLY REPORTED AND CALLED VALUE. GRAM STAIN REVIEWED-AGREE WITH RESULT DRT    Culture (A)  Final    SERRATIA  MARCESCENS SUSCEPTIBILITIES PERFORMED ON PREVIOUS CULTURE WITHIN THE LAST 5 DAYS. Performed at [facility], [address] [place], [place] [address]    Report Status 11/02/2023 FINAL  Final  Resp panel by RT-PCR (RSV, Flu A&B, Covid) Anterior Nasal Swab     Status: None   Collection Time: 10/30/23  8:01 PM   Specimen: Anterior Nasal Swab  Result Value Ref Range Status   SARS Coronavirus 2 by RT PCR NEGATIVE NEGATIVE Final    Comment: (NOTE) SARS-CoV-2 target nucleic acids are NOT DETECTED.  The SARS-CoV-2 RNA is generally detectable in upper respiratory specimens during the acute phase of infection. The lowest concentration of SARS-CoV-2 viral copies this assay can detect is 138 copies/mL. A negative result does not preclude SARS-Cov-2 infection and should not be used as the sole basis for treatment or other patient management decisions. A negative result may occur with  improper specimen collection/handling, submission of specimen other than nasopharyngeal swab, presence of viral mutation(s) within the areas targeted by this assay, and inadequate number of viral copies(<138 copies/mL). A negative result must be combined with clinical observations, patient history, and epidemiological information. The expected result is Negative.  Fact Sheet for Patients:  BloggerCourse.com  Fact Sheet for Healthcare Providers:  SeriousBroker.it  This test is no t yet approved or cleared by the [place]  FDA and  has been authorized for detection and/or diagnosis of SARS-CoV-2 by FDA under an Emergency Use Authorization (EUA). This EUA will remain  in effect (meaning this test can be used) for the duration of the COVID-19 declaration under Section 564(b)(1) of the Act, 21 U.S.C.section 360bbb-3(b)(1), unless the authorization is terminated  or revoked sooner.       Influenza A by PCR NEGATIVE NEGATIVE Final   Influenza B by PCR  NEGATIVE NEGATIVE Final    Comment: (NOTE) The Xpert Xpress SARS-CoV-2/FLU/RSV plus assay is intended as an aid in the diagnosis of influenza from Nasopharyngeal swab specimens and should not be used as  a sole basis for treatment. Nasal washings and aspirates are unacceptable for Xpert Xpress SARS-CoV-2/FLU/RSV testing.  Fact Sheet for Patients: BloggerCourse.com  Fact Sheet for Healthcare Providers: SeriousBroker.it  This test is not yet approved or cleared by the [place]  FDA and has been authorized for detection and/or diagnosis of SARS-CoV-2 by FDA under an Emergency Use Authorization (EUA). This EUA will remain in effect (meaning this test can be used) for the duration of the COVID-19 declaration under Section 564(b)(1) of the Act, 21 U.S.C. section 360bbb-3(b)(1), unless the authorization is terminated or revoked.     Resp Syncytial Virus by PCR NEGATIVE NEGATIVE Final    Comment: (NOTE) Fact Sheet for Patients: BloggerCourse.com  Fact Sheet for Healthcare Providers: SeriousBroker.it  This test is not yet approved or cleared by the [place]  FDA and has been authorized for detection and/or diagnosis of SARS-CoV-2 by FDA under an Emergency Use Authorization (EUA). This EUA will remain in effect (meaning this test can be used) for the duration of the COVID-19 declaration under Section 564(b)(1) of the Act, 21 U.S.C. section 360bbb-3(b)(1), unless the authorization is terminated or revoked.  Performed at [facility], [address] [place], [place] [address]   MRSA Next Gen by PCR, Nasal     Status: None   Collection Time: 10/31/23  2:05 PM   Specimen: Nasal Mucosa; Nasal Swab  Result Value Ref Range Status   MRSA by PCR Next Gen NOT DETECTED NOT DETECTED Final    Comment: (NOTE) The GeneXpert MRSA Assay (FDA approved for NASAL specimens only), is  one component of a comprehensive MRSA colonization surveillance program. It is not intended to diagnose MRSA infection nor to guide or monitor treatment for MRSA infections. Test performance is not FDA approved in patients less than 30 years old. Performed at [facility], [address] [place], [place] [address]     Coagulation Studies: No results for input(s): LABPROT, INR in the last 72 hours.   Urinalysis: Recent Labs    11/01/23 0050  COLORURINE YELLOW*  LABSPEC 1.011  PHURINE 5.0  GLUCOSEU NEGATIVE  HGBUR NEGATIVE  BILIRUBINUR NEGATIVE  KETONESUR NEGATIVE  PROTEINUR NEGATIVE  NITRITE NEGATIVE  LEUKOCYTESUR MODERATE*      Imaging: DG Abd 1 View Result Date: 11/03/2023 CLINICAL DATA:  63 year old female. Enteric tube placement. Abdominal distension. EXAM: ABDOMEN - 1 VIEW COMPARISON:  11/01/2023 and earlier. FINDINGS: Portable AP supine views at 0529 hours. Enteric tube terminates in the stomach, side hole the level of the gastric fundus. Lung bases appear stable and negative. Non obstructed bowel gas pattern. Left femoral approach catheter in place. Aortoiliac calcified atherosclerosis. No acute osseous abnormality identified. IMPRESSION: 1. Stable and satisfactory enteric tube placement in the stomach. 2. Non obstructed bowel gas pattern. 3.  Aortic Atherosclerosis (ICD10-I70.0). Electronically Signed   By: VEAR Hurst M.D.   On: 11/03/2023 09:42   US  ARTERIAL ABI (SCREENING LOWER EXTREMITY) Result Date: 11/02/2023 CLINICAL DATA:  Peripheral arterial disease EXAM: NONINVASIVE PHYSIOLOGIC VASCULAR STUDY OF BILATERAL LOWER EXTREMITIES TECHNIQUE: Evaluation of both lower extremities were performed at rest, including calculation of ankle-brachial indices with single level Doppler, pressure and pulse volume recording. COMPARISON:  None Available. FINDINGS: Right ABI:  1.2 Left ABI:  1.1 Right Lower Extremity:  Mildly abnormal biphasic arterial waveforms. Left Lower  Extremity:  Mildly abnormal biphasic arterial waveforms. 1.0-1.4 Normal IMPRESSION: 1. Mildly abnormal arterial waveforms bilaterally suggests at least an element of underlying peripheral arterial disease. 2. However, the bilateral  ankle-brachial indices remain within normal limits and are symmetric. Electronically Signed   By: Wilkie Lent M.D.   On: 11/02/2023 08:09   US  RENAL Result Date: 11/01/2023 CLINICAL DATA:  [id number] Acute kidney failure (HCC) [id number] EXAM: RENAL / URINARY TRACT ULTRASOUND COMPLETE COMPARISON:  CT abdomen pelvis 07/06/2021 FINDINGS: Right Kidney: Renal measurements: 10.1 x 4.9 x 5 cm = volume: 130.2 mL. Echogenicity within normal limits. No mass or hydronephrosis visualized. Left Kidney: Renal measurements: 10.7 x 5.4 x 4.6 cm = volume: 140.3 mL. Echogenicity within normal limits. Left nephrolithiasis measuring up to 5 mm. No mass or hydronephrosis visualized. Bladder: Foley catheter inflated balloon terminating within the urinary bladder lumen. Urinary bladder decompressed with urinary bladder wall thickening. Other: Increased hepatic parenchyma echogenicity. At least small volume simple free fluid ascites. IMPRESSION: 1. Urinary bladder wall thickening. May be due to under distension in the setting of a Foley. Correlate with urinalysis for infection. 2. Nonobstructive left nephrolithiasis. 3. Hepatic steatosis. 4. At least small volume simple free fluid ascites. Electronically Signed   By: Morgane  Naveau M.D.   On: 11/01/2023 17:25     Medications:    albumin  human 12.5 g (11/03/23 1508)   cefTRIAXone  (ROCEPHIN )  IV 2 g (11/03/23 1517)    budesonide  (PULMICORT ) nebulizer solution  0.5 mg Nebulization BID   Chlorhexidine  Gluconate Cloth  6 each Topical Daily   feeding supplement  237 mL Oral BID BM   insulin  aspart  0-15 Units Subcutaneous Q4H   ipratropium-albuterol   3 mL Nebulization BID   leptospermum manuka honey  1 Application Topical Daily   lidocaine   1 patch  Transdermal Q24H   midodrine   10 mg Per NG tube TID WC   multivitamin with minerals  1 tablet Per Tube Daily   pantoprazole  (PROTONIX ) IV  40 mg Intravenous Q24H   thiamine   100 mg Per Tube Daily   acetaminophen , acetaminophen , [DISCONTINUED] insulin  aspart **AND** dextrose  **AND** POCT CBG monitoring, ipratropium-albuterol , morphine  injection, [DISCONTINUED] ondansetron  **OR** ondansetron  (ZOFRAN ) IV, oxyCODONE   Assessment/ Plan:  Ms. KYELLE URBAS is a 63 y.o.  female with past medical history including COPD, type 2 diabetes, anxiety, depression, GERD and hypertension, who was admitted to [facility] on 10/30/2023 for Hyperkalemia [E87.5] Hyponatremia [E87.1] Weakness [R53.1] Leg swelling [M79.89] Wound infection [T14.8XXA, L08.9] AKI (acute kidney injury) (HCC) [N17.9] Sepsis (HCC) [A41.9] Sepsis due to cellulitis (HCC) [L03.90, A41.9] Sepsis, due to unspecified organism, unspecified whether acute organ dysfunction present (HCC) [A41.9]   Acute kidney injury likely secondary to multiple factors: infectious process (gram-negative sepsis), hypotension and volume depletion. Normal renal function noted last month. Found down by son. CK 58. Renal US -nonobstructive left kidney stone. Creatinine and urine output have improved today. Foley catheter in place.  Continue IVF and blood support. No acute indication for dialysis.    Lab Results  Component Value Date   CREATININE 1.94 (H) 11/03/2023   CREATININE 2.15 (H) 11/03/2023   CREATININE 2.97 (H) 11/02/2023    Intake/Output Summary (Last 24 hours) at 11/03/2023 1557 Last data filed at 11/03/2023 1500 Gross per 24 hour  Intake 2674.62 ml  Output 1490 ml  Net 1184.62 ml   2. Acute metabolic acidosis, S bicarb 13->24->32.   - Sodium bicarbonate  has been discontinued  3. Anemia in chronic kidney disease Lab Results  Component Value Date   HGB 8.1 (L) 11/03/2023    Hemoglobin remains low.  Monitor for bleeding.  4. Sepsis  secondary to cellulitis, bilateral lower extremities.  Gram-negative  sepsis.  Antibiotics as per primary team.  5.  Hypokalemia Levels are improving with replacement.   LOS: 4 Demetrius Mahler Dennise 9/14/20253:57 PM

## 2023-11-03 NOTE — Consult Note (Signed)
 PHARMACY CONSULT NOTE - ELECTROLYTES  Pharmacy Consult for Electrolyte Monitoring and Replacement   Recent Labs: Height: 5' 8 (172.7 cm) Weight: 69.2 kg (152 lb 8.9 oz) IBW/kg (Calculated) : 63.9 Estimated Creatinine Clearance: 27 mL/min (A) (by C-G formula based on SCr of 2.15 mg/dL (H)). Potassium (mmol/L)  Date Value  11/03/2023 3.0 (L)  11/16/2013 4.0   Magnesium  (mg/dL)  Date Value  90/85/7974 1.8   Calcium  (mg/dL)  Date Value  90/85/7974 6.6 (L)   Calcium , Total (mg/dL)  Date Value  90/71/7984 7.9 (L)   Albumin  (g/dL)  Date Value  90/85/7974 <1.5 (L)  07/06/2015 4.2  10/07/2012 2.9 (L)   Phosphorus (mg/dL)  Date Value  90/85/7974 2.6   Sodium (mmol/L)  Date Value  11/03/2023 136  07/06/2015 146 (H)  11/16/2013 131 (L)    Assessment  Tricia [name] is a 63 y.o. female presenting with weakness and leg swelling. PMH significant for anxiety, osteoarthritis, asthma, COPD, depression, type 2 diabetes mellitus, GERD, dyslipidemia, and hypertension  . Pharmacy has been consulted to monitor and replace electrolytes.  Diet: dysphagia 3 diet MIVF: N/A Pertinent medications: N/A  Goal of Therapy: Electrolytes WNL  Plan:  Ca 6.6: Calcium  gluconate 2g IV x 1 K 3.0: Kcl 10mEq IV x 4 Check BMP, Mg, Phos with AM labs  Thank you for allowing pharmacy to be a part of this patient's care.  Laquashia Mergenthaler A Mariajose Mow, PharmD Clinical Pharmacist 11/03/2023 7:20 AM

## 2023-11-03 NOTE — Progress Notes (Signed)
 Speech Language Pathology Treatment: Dysphagia  Patient Details Name: Tricia [name] MRN: [id number] DOB: [birth year] Today's Date: 11/03/2023 Time: 1120-1200 SLP Time Calculation (min) (ACUTE ONLY): 40 min  Assessment / Plan / Recommendation Clinical Impression  Pt seen today for ongoing assessment of swallowing; toleration of current mech soft diet, thin liquids. Pt was A?O x3; verbal and engaged in general conversation w/ this SLP maintaining topic and answering questions appropriately. She stated she does not wear her Dentures to eat unless going out to eat. Oral care completed b/f po's. Pt fed self w/ Min-Mod setup and positioning support. Pt was distracted by her sore feet/LEs; elevated on pillow.  Currently has an NGT to vent/suction. On [place] O2 support, afebrile. WBC wnl.  Pt endorsed poor appetite-  I really don't feel like much to eat, but I ordered salmon today for Lunch. Discussed other finger foods/easy to eat foods and ways to moisten/soften them d/t Edentulous status and for conservation of energy.   Pt appears to present w/ functional oropharyngeal phase swallowing w/ No overt oropharyngeal phase dysphagia noted, No neuromuscular deficits noted. Pt consumed po trials w/ No overt, clinical s/s of aspiration during the po trials. Pt appears at reduced risk for aspiration when following general aspiration precautions.  However, pt does have challenging factors that could impact her oropharyngeal swallowing to include Pain/discomfort(NSG aware), deconditioning/weakness, and acute illness/hospitalization. Pt is Edentulous. These factors can increase risk for dysphagia as well as decreased oral intake overall.   During po trials, pt consumed trials w/ no overt coughing, decline in vocal quality, or change in respiratory presentation during/post trials. No decline in O2 sats. Oral phase appeared grossly [facility] w/ timely bolus management, mashing/gumming of softened solids, and control  of bolus propulsion for A-P transfer for swallowing. Oral clearing achieved w/ all trial consistencies -- moistened trials given.  Pt fed self w/ setup support.   Recommend continue a Mech Soft consistency diet w/ well-Cut meats, moistened foods; Thin liquids -- monitor sitting up for oral intake, straw use-- pt should Hold Cup when drinking. Recommend general aspiration precautions, tray setup and sitting up support for all po's. Small bites/sips. Pills WHOLE in Puree for safer, easier swallowing. REFLUX precautions.  Education given on Pills in Puree; food consistencies and easy to eat options; general aspiration precautions to pt.  ST services can monitor for any new needs during admit. NSG updated, agreed. MD updated. Recommend Dietician f/u for support. Precautions posted in chart.       HPI HPI: Tricia [name] is a 63 y.o. female who presented to ED with acute onset of generalizated weakness and BLE swelling (L>R) after being found on the ground for several hours covered in urine and feces. Pt with a PMH significant for anxiety, osteoarthritis, asthma, COPD, depression, type 2 diabetes mellitus, GERD, dyslipidemia, neuromuscular dis., and hypertension.  CXR 1. Emphysema and background scarring unchanged since prior study. No  acute airspace disease..    Present Dxs: Severe Sepsis due to Serratia Bacteremia secondary to Severe Cellulitis of Bilateral lower extremities, along with AKI and Anion Gap Metabolic Acidosis.  Course complicated by development of Septic Shock requiring vasopressors and Acute COPD Exacerbation.      SLP Plan  Continue with current plan of care (po check)          Recommendations  Diet recommendations: Dysphagia 3 (mechanical soft);Thin liquid Liquids provided via: Cup;Straw Medication Administration: Whole meds with puree (for ease) Supervision: Patient able to self feed;Staff to assist  with self feeding;Intermittent supervision to cue for compensatory  strategies (setup) Compensations: Minimize environmental distractions;Slow rate;Small sips/bites;Lingual sweep for clearance of pocketing;Follow solids with liquid Postural Changes and/or Swallow Maneuvers: Out of bed for meals;Seated upright 90 degrees;Upright 30-60 min after meal                 (Dietician f/u) Oral care BID;Patient independent with oral care (setup)   Intermittent Supervision/Assistance Dysphagia, unspecified (R13.10) (Sepsis w/ NGT to vent and drain)     Continue with current plan of care (po check)      Comer Portugal, MS, CCC-SLP Speech Language Pathologist Rehab Services; [facility] [phone] (ascom) Ayham Word  11/03/2023, 12:52 PM

## 2023-11-03 NOTE — Progress Notes (Signed)
 NAME:  Tricia [name], MRN:  [id number], DOB:  [birth year], LOS: 4 ADMISSION DATE:  10/30/2023, CONSULTATION DATE:  10/31/2023 REFERRING MD:  Dr. Lenon, CHIEF COMPLAINT:  Hypotension    Brief Pt Description / Synopsis:  63 y.o. female with PMHx most significant for COPD on home oxygen  admitted with Severe Sepsis due to Serratia Bacteremia secondary to severe Cellulitis of bilateral lower extremities, along with AKI and Anion Gap Metabolic Acidosis.  Course complicated by development of Septic Shock requiring vasopressors and Acute COPD Exacerbation.  History of Present Illness:  Tricia [name] is a 63 year old female with a past medical history significant for COPD requiring home supplemental oxygen , asthma, hypertension, type 2 diabetes mellitus, dyslipidemia, GERD who presented to [facility] ED on 10/30/2023 due to acute onset of generalized weakness and bilateral lower extremity swelling (left greater than right).  She reported she had been on the ground for several hours before her son found her.  She denied syncopal episode or hitting her head, but was just too weak to get up off the ground.  She was found covered in urine and feces.  She was also noted to have left leg wound with purulent drainage and right leg ulcer with surrounding erythema and induration.  She also endorsed shortness of breath and fever/chills.   She denies chest pain, palpitations, cough, sputum production, wheezing, abdominal pain, dysuria, nausea, vomiting.  11/03/23- Patient on nasal canula with septic encephalopathy but able to pass swallow and eat slowly.  She is showing some sings of electrolyte derrangements which are likely due to re-feeding with severe protein cal malnutrition.  Also noted worsening thromocytopenia in context of septic shock. We placed testing for DIC and HIT panel today. Pharmacy is on case with active repletion of electrolytes. WBC count normalized and Hb are stable.   Past Medical History:   Diagnosis Date   Anxiety    Arthritis    joints and hands/ knees   Asthma    uses inhaler   Benign essential tremor    head   Cervical dystonia    neck pain   Cholesteatoma of left ear    x2   COPD (chronic obstructive pulmonary disease) (HCC)    Cough    Depression    Diabetes mellitus without complication (HCC)    type 2   Diastolic dysfunction    Dyspnea    Dysrhythmia    diastolic dysfunction   GERD (gastroesophageal reflux disease)    Headache    migraines/ one per week   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   Hyperlipidemia    Hypertension    Motion sickness    boat   Neuromuscular disorder (HCC)    neuropathy feet and hands( nerve damage)   Wears dentures    upper and lower    Micro Data:  9/10: COVID/FLU/RSV PCR>> negative  9/10: Blood cultures (4/4 bottles)>> Serratia Marcescens   Antimicrobials:   Anti-infectives (From admission, onward)    Start     Dose/Rate Route Frequency Ordered Stop   11/02/23 1400  cefTRIAXone  (ROCEPHIN ) 2 g in sodium chloride  0.9 % 100 mL IVPB        2 g 200 mL/hr over 30 Minutes Intravenous Every 24 hours 11/02/23 1058     11/01/23 2200  meropenem  (MERREM ) 500 mg in sodium chloride  0.9 % 100 mL IVPB  Status:  Discontinued        500 mg 200 mL/hr over 30 Minutes Intravenous  Every 12 hours 11/01/23 1043 11/02/23 1058   11/01/23 1030  meropenem  (MERREM ) 1 g in sodium chloride  0.9 % 100 mL IVPB  Status:  Discontinued        1 g 200 mL/hr over 30 Minutes Intravenous Every 12 hours 11/01/23 1013 11/01/23 1043   10/31/23 2100  ceFEPIme  (MAXIPIME ) 2 g in sodium chloride  0.9 % 100 mL IVPB  Status:  Discontinued        2 g 200 mL/hr over 30 Minutes Intravenous Every 24 hours 10/30/23 2140 11/01/23 1013   10/30/23 2218  vancomycin  variable dose per unstable renal function (pharmacist dosing)  Status:  Discontinued         Does not apply See admin instructions 10/30/23 2218 10/31/23 0909   10/30/23 2145  vancomycin   (VANCOREADY) IVPB 750 mg/150 mL        750 mg 150 mL/hr over 60 Minutes Intravenous  Once 10/30/23 2136 10/31/23 0101   10/30/23 2130  vancomycin  (VANCOCIN ) IVPB 1000 mg/200 mL premix  Status:  Discontinued        1,000 mg 200 mL/hr over 60 Minutes Intravenous  Once 10/30/23 2127 10/30/23 2133   10/30/23 2130  ceFEPIme  (MAXIPIME ) 2 g in sodium chloride  0.9 % 100 mL IVPB  Status:  Discontinued        2 g 200 mL/hr over 30 Minutes Intravenous  Once 10/30/23 2127 10/30/23 2140   10/30/23 2030  ceFEPIme  (MAXIPIME ) 2 g in sodium chloride  0.9 % 100 mL IVPB        2 g 200 mL/hr over 30 Minutes Intravenous  Once 10/30/23 2023 10/30/23 2205   10/30/23 2015  metroNIDAZOLE  (FLAGYL ) IVPB 500 mg        500 mg 100 mL/hr over 60 Minutes Intravenous  Once 10/30/23 2000 10/30/23 2330   10/30/23 2000  cefTRIAXone  (ROCEPHIN ) 2 g in sodium chloride  0.9 % 100 mL IVPB  Status:  Discontinued        2 g 200 mL/hr over 30 Minutes Intravenous Once 10/30/23 1953 10/30/23 2000   10/30/23 2000  vancomycin  (VANCOCIN ) IVPB 1000 mg/200 mL premix        1,000 mg 200 mL/hr over 60 Minutes Intravenous  Once 10/30/23 1953 10/30/23 2103       Significant Hospital Events: Including procedures, antibiotic start and stop dates in addition to other pertinent events   9/10: Admitted by [facility] for sepsis due to bilateral LE cellulitis and AKI.   9/11: Pt becoming more hypotensive concerning for possible developing septic shock, PCCM consulted in case were to need vasopressors.  Nephrology consulted for AKI, started on Bicarb gtt.  Left femoral CVC placed due to limited peripheral IV access and vasopressor needs.  Interim History / Subjective:  As outlined above under Significant Hospital Events section  Objective   Blood pressure 127/62, pulse (!) 49, temperature 97.7 F (36.5 C), temperature source Oral, resp. rate (!) 9, height 5' 8 (1.727 m), weight 69.2 kg, SpO2 (!) 65%.        Intake/Output Summary (Last 24 hours)  at 11/03/2023 1231 Last data filed at 11/03/2023 0800 Gross per 24 hour  Intake 2772.72 ml  Output 1425 ml  Net 1347.72 ml   Filed Weights   10/31/23 1346 11/02/23 0500 11/03/23 0500  Weight: 64.2 kg 71.7 kg 69.2 kg    Examination: General: Acute on chronically frail appearing elderly female, sitting in bed, on room air, no acute distress HENT: Atraumatic, normocephalic, neck supple, no JVD Lungs: Mild  expiratory wheezing throughout, even, nonlabored, normal effort Cardiovascular: Regular rate and rhythm (sinus rhythm on telemetry), S1-S2, no murmurs, rubs, gallops Abdomen: Soft, slightly tender to palpation, nondistended, no guarding or rebound tenderness, bowel sounds positive x 4 Extremities: Severe cellulitis to bilateral lower extremities with wounds to left lateral shin and right shin (see images below), normal bulk and tone, no deformities Neuro: Awake and alert, oriented x 4, moves all extremities commands, no focal deficits noted, speech clear, pupils PERRLA GU: Deferred     Resolved Hospital Problem list     Assessment & Plan:   #1 Shock: Septic - bacteremia - serratia - present on admission - source is blood              Merem now IV dcd cefepime  due to encephalopathy   #2 bilateral LE cellulitis Chronic HFpEF         On abx  , imaging done with no finding of nec fasc or osteomyelitis       S/p vasc surg eval - no ischemia   PMHx: Dyslipidemia, Hypotension   Echocardiogram 09/13/23: LVEF 60-65%, grade I Diastolic dysfunction, RV systolic function normal, RV size normal -Continuous cardiac monitoring -Maintain MAP >65 -Gentle IV fluids -Vasopressors as needed to maintain MAP goal -Continue home midodrine   -Trend lactic acid until normalized -Consider repeat Echocardiogram  -Diuresis as BP and renal function permits ~ holding due to shock and AKI  #Acute Kidney Injury- KDIGO 3 - due to septic nephropathy             S/p nephrology evaluation - made >1L urine ,  will hold off dialysis  #Anion Gap Metabolic Acidosis #Mild Hyperkalemia #Mild Hyponatremia  -Monitor I&O's / urinary output -Follow BMP -Ensure adequate renal perfusion -Avoid nephrotoxic agents as able -Replace electrolytes as indicated ~ Pharmacy following for assistance with electrolyte replacement -Start Bicarb gtt -Consult Nephrology, appreciate input   #Acute on Chronic Hypoxic Respiratory Failure #Acute COPD Exacerbation  PMHx: COPD requiring home oxygen  -Supplemental O2 as needed to maintain O2 sats 88 to 92% -BiPAP if needed -Follow intermittent Chest X-ray & ABG as needed -Bronchodilators  -IV Steroids -ABX as above -Pulmonary toilet as able    Best Practice (right click and Reselect all SmartList Selections daily)   Diet/type: Regular consistency (see orders) DVT prophylaxis: LMWH GI prophylaxis: PPI Lines: Left femoral CVC placed 9/11 Foley:  N/A Code Status:  full code Last date of multidisciplinary goals of care discussion [N/A]    Labs   CBC: Recent Labs  Lab 10/30/23 1928 10/31/23 0610 10/31/23 1629 11/01/23 0352 11/02/23 0400 11/02/23 1215 11/03/23 0320  WBC 10.1   < > 17.1* 15.6* 4.4 4.4 5.4  NEUTROABS 8.9*  --   --   --   --  3.7  --   HGB 11.8*   < > 9.8* 9.4* 7.2* 8.0* 8.1*  HCT 36.9   < > 29.7* 27.4* 21.2* 23.1* 23.0*  MCV 97.4   < > 96.4 93.5 92.2 91.7 91.3  PLT 367   < > 184 146* 67* 61* 57*   < > = values in this interval not displayed.    Basic Metabolic Panel: Recent Labs  Lab 10/31/23 1632 10/31/23 1938 11/01/23 0352 11/02/23 0400 11/03/23 0320  NA 129* 129* 131* 132* 136  K 5.0 4.7 4.5 3.6 3.0*  CL 102 102 102 100 96*  CO2 16* 17* 18* 24 30  GLUCOSE 182* 206* 217* 131* 101*  BUN 47* 47* 51*  48* 41*  CREATININE 3.47* 3.28* 3.51* 2.97* 2.15*  CALCIUM  7.0* 7.0* 7.2* 6.9* 6.6*  MG  --   --  1.6* 2.0 1.8  PHOS  --   --  5.3* 3.9 2.6   GFR: Estimated Creatinine Clearance: 27 mL/min (A) (by C-G formula based on SCr  of 2.15 mg/dL (H)). Recent Labs  Lab 10/30/23 1928 10/30/23 2213 10/31/23 0610 10/31/23 1629 10/31/23 1930 11/01/23 0352 11/02/23 0400 11/02/23 1215 11/03/23 0320  WBC 10.1  --    < > 17.1*  --  15.6* 4.4 4.4 5.4  LATICACIDVEN 2.6* 2.5*  --  2.6* 1.8  --   --   --   --    < > = values in this interval not displayed.    Liver Function Tests: Recent Labs  Lab 10/30/23 1928 11/01/23 0352 11/02/23 0400 11/03/23 0320  AST 25  --   --   --   ALT 9  --   --   --   ALKPHOS 123  --   --   --   BILITOT 1.2  --   --   --   PROT 5.7*  --   --   --   ALBUMIN  2.0* <1.5* 1.5* <1.5*   No results for input(s): LIPASE, AMYLASE in the last 168 hours. No results for input(s): AMMONIA in the last 168 hours.  ABG    Component Value Date/Time   HCO3 38.2 (H) 09/08/2021 1647   O2SAT 51.7 09/08/2021 1647     Coagulation Profile: Recent Labs  Lab 10/30/23 1928 10/31/23 0610  INR 1.3* 1.7*    Cardiac Enzymes: Recent Labs  Lab 10/30/23 2213  CKTOTAL 38    HbA1C: Hemoglobin A1C  Date/Time Value Ref Range Status  05/01/2023 02:51 PM 6.0 (A) 4.0 - 5.6 % Final  11/01/2022 10:09 AM 5.9 (A) 4.0 - 5.6 % Final  10/09/2012 04:52 AM 7.4 (H) 4.2 - 6.3 % Final    Comment:    The American Diabetes Association recommends that a primary goal of therapy should be <7% and that physicians should reevaluate the treatment regimen in patients with HbA1c values consistently >8%.   07/02/2012 05:33 AM 7.1 (H) 4.2 - 6.3 % Final    Comment:    The American Diabetes Association recommends that a primary goal of therapy should be <7% and that physicians should reevaluate the treatment regimen in patients with HbA1c values consistently >8%.    HbA1c, POC (controlled diabetic range)  Date/Time Value Ref Range Status  03/19/2018 02:04 PM 13.7 (A) 0.0 - 7.0 % Final  10/29/2017 02:18 PM 14.0 (A) 0.0 - 7.0 % Final    Comment:    >14.0   Hgb A1c MFr Bld  Date/Time Value Ref Range Status   10/30/2023 10:52 PM 5.1 4.8 - 5.6 % Final    Comment:    (NOTE) Diagnosis of Diabetes The following HbA1c ranges recommended by the American Diabetes Association (ADA) may be used as an aid in the diagnosis of diabetes mellitus.  Hemoglobin             Suggested A1C NGSP%              Diagnosis  <5.7                   Non Diabetic  5.7-6.4                Pre-Diabetic  >6.4  Diabetic  <7.0                   Glycemic control for                       adults with diabetes.    06/05/2021 02:54 PM 6.6 (H) <5.7 % of total Hgb Final    Comment:    For someone without known diabetes, a hemoglobin A1c value of 6.5% or greater indicates that they may have  diabetes and this should be confirmed with a follow-up  test. . For someone with known diabetes, a value <7% indicates  that their diabetes is well controlled and a value  greater than or equal to 7% indicates suboptimal  control. A1c targets should be individualized based on  duration of diabetes, age, comorbid conditions, and  other considerations. . Currently, no consensus exists regarding use of hemoglobin A1c for diagnosis of diabetes for children. .     CBG: Recent Labs  Lab 11/02/23 2324 11/03/23 0328 11/03/23 0352 11/03/23 0718 11/03/23 1103  GLUCAP 137* 103* 97 94 123*    Review of Systems:   Positives in BOLD: Gen: Denies fever, chills, weight change, fatigue, night sweats HEENT: Denies blurred vision, double vision, hearing loss, tinnitus, sinus congestion, rhinorrhea, sore throat, neck stiffness, dysphagia PULM: Denies shortness of breath, cough, sputum production, hemoptysis, wheezing CV: Denies chest pain, edema, orthopnea, paroxysmal nocturnal dyspnea, palpitations GI: Denies abdominal pain, nausea, vomiting, diarrhea, hematochezia, melena, constipation, change in bowel habits GU: Denies dysuria, hematuria, polyuria, oliguria, urethral discharge Endocrine: Denies hot or cold  intolerance, polyuria, polyphagia or appetite change Derm: Denies rash, dry skin, scaling or peeling skin change, cellulitis of bilateral lower extremities  Heme: Denies easy bruising, bleeding, bleeding gums Neuro: Denies headache, numbness, weakness, slurred speech, loss of memory or consciousness   Past Medical History:  She,  has a past medical history of Anxiety, Arthritis, Asthma, Benign essential tremor, Cervical dystonia, Cholesteatoma of left ear, COPD (chronic obstructive pulmonary disease) (HCC), Cough, Depression, Diabetes mellitus without complication (HCC), Diastolic dysfunction, Dyspnea, Dysrhythmia, GERD (gastroesophageal reflux disease), Headache, HOH (hard of hearing), Hyperlipidemia, Hypertension, Motion sickness, Neuromuscular disorder (HCC), and Wears dentures.   Surgical History:   Past Surgical History:  Procedure Laterality Date   CARPAL TUNNEL RELEASE Bilateral    x2 right, 1x on left   COLONOSCOPY     COLONOSCOPY WITH PROPOFOL  N/A 04/25/2017   Procedure: COLONOSCOPY WITH PROPOFOL ;  Surgeon: Jinny Carmine, MD;  Location: [facility];  Service: Endoscopy;  Laterality: N/A;  diabetic-oral med   DILATION AND CURETTAGE OF UTERUS     ESOPHAGOGASTRODUODENOSCOPY (EGD) WITH PROPOFOL  N/A 01/10/2021   Procedure: ESOPHAGOGASTRODUODENOSCOPY (EGD) WITH PROPOFOL ;  Surgeon: Janalyn Keene NOVAK, MD;  Location: [facility];  Service: Endoscopy;  Laterality: N/A;  Diabetic   EXTERNAL EAR SURGERY Left    x2   POLYPECTOMY  04/25/2017   Procedure: POLYPECTOMY INTESTINAL;  Surgeon: Jinny Carmine, MD;  Location: [facility];  Service: Endoscopy;;   SPINE SURGERY     herniated disc   TUBAL LIGATION       Social History:   reports that she has been smoking cigarettes. She started smoking about 46 years ago. She has a 46.5 pack-year smoking history. She has never used smokeless tobacco. She reports that she does not drink alcohol and does not use drugs.   Family  History:  Her family history includes Anxiety disorder in her mother; Bipolar  disorder in her daughter and daughter; Cervical cancer in her daughter; Diabetes in her daughter; Drug abuse in her daughter; Emphysema in her mother; Hypertension in her daughter; Lung cancer in her maternal aunt, maternal grandfather, maternal grandmother, and maternal uncle; Multiple sclerosis in her daughter; Stroke in her father; Throat cancer in her father.   Allergies Allergies  Allergen Reactions   Augmentin [Amoxicillin-Pot Clavulanate] Diarrhea   Penicillins Itching     Home Medications  Prior to Admission medications   Medication Sig Start Date End Date Taking? Authorizing Provider  ascorbic acid  (VITAMIN C ) 500 MG tablet Take 1 tablet (500 mg total) by mouth 2 (two) times daily. 09/27/23  Yes Alexander, Natalie, DO  aspirin  EC 81 MG tablet Take 81 mg by mouth daily. Swallow whole.   Yes [provider]  calcium  carbonate (TUMS - DOSED IN MG ELEMENTAL CALCIUM ) 500 MG chewable tablet Chew 2 tablets (400 mg of elemental calcium  total) by mouth 3 (three) times daily as needed for indigestion or heartburn. 09/30/23  Yes Alexander, Natalie, DO  carbidopa -levodopa  (SINEMET  CR) 50-200 MG tablet Take 1 tablet by mouth at bedtime. 07/26/23 07/25/24 Yes [provider]  carbidopa -levodopa  (SINEMET  IR) 25-100 MG tablet Take 1.5 tablets by mouth 3 (three) times daily. 01/25/20 10/30/23 Yes Maree Jannett POUR, MD  colchicine  0.6 MG tablet Take 1 tablet (0.6 mg total) by mouth 2 (two) times daily as needed for up to 3 days (gout flare). 09/27/23 10/30/23 Yes Alexander, Natalie, DO  cyclobenzaprine  (FLEXERIL ) 10 MG tablet Take 10 mg by mouth at bedtime. 08/01/21  Yes [provider]  diphenoxylate -atropine  (LOMOTIL ) 2.5-0.025 MG tablet Take 2 tablets by mouth 4 (four) times daily as needed for diarrhea or loose stools. 09/27/23  Yes Alexander, Natalie, DO  DULoxetine  (CYMBALTA ) 60 MG capsule Take 1 capsule (60 mg  total) by mouth daily. 05/01/23  Yes Sowles, Krichna, MD  empagliflozin  (JARDIANCE ) 25 MG TABS tablet Take 1 tablet (25 mg total) by mouth daily before breakfast. 05/01/23  Yes Sowles, Krichna, MD  furosemide  (LASIX ) 40 MG tablet Take 1 tablet (40 mg total) by mouth daily as needed. 06/21/17  Yes [name], Almarie BRAVO, NP  ipratropium-albuterol  (DUONEB) 0.5-2.5 (3) MG/3ML SOLN Inhale 3 mLs into the lungs every 6 (six) hours as needed. 06/21/17  Yes [name], Sherrey E, NP  leptospermum manuka honey (MEDIHONEY) PSTE paste Apply 1 Application topically daily. Apply to sacral wound Apply thin layer (3 mm) to wound. 09/28/23  Yes Alexander, Natalie, DO  levocetirizine (XYZAL ) 5 MG tablet Take 1 tablet (5 mg total) by mouth every evening. 05/01/23  Yes Sowles, Krichna, MD  midodrine  (PROAMATINE ) 5 MG tablet Take 1 tablet (5 mg total) by mouth 3 (three) times daily with meals. 09/27/23  Yes Alexander, Natalie, DO  montelukast  (SINGULAIR ) 10 MG tablet Take 1 tablet by mouth at bedtime. 02/21/21  Yes Fleming, Herbon E, MD  Multiple Vitamin (MULTIVITAMIN WITH MINERALS) TABS tablet Take 1 tablet by mouth daily. 09/28/23  Yes Alexander, Natalie, DO  omeprazole  (PRILOSEC) 40 MG capsule Take 1 capsule (40 mg total) by mouth daily. 05/01/23  Yes Sowles, Krichna, MD  oxyCODONE  (OXY IR/ROXICODONE ) 5 MG immediate release tablet Take 1 tablet (5 mg total) by mouth every 4 (four) hours as needed for moderate pain (pain score 4-6) or severe pain (pain score 7-10). 09/27/23  Yes Alexander, Natalie, DO  QUEtiapine  (SEROQUEL ) 25 MG tablet Take 1 tablet (25 mg total) by mouth at bedtime. 05/01/23  Yes Sowles, Krichna, MD  rosuvastatin  (CRESTOR ) 5 MG tablet Take 1 tablet (5 mg total) by mouth at bedtime. 05/01/23  Yes Sowles, Krichna, MD  SYMBICORT  160-4.5 MCG/ACT inhaler INHALE 2 PUFFS TWICE A DAY RINSE MOUTH WITH WATER  AFTER EACH USE 07/09/22  Yes Sowles, Krichna, MD  theophylline  (UNIPHYL) 400 MG 24 hr tablet Take 1 tablet by mouth daily.   12/27/17  Yes Fleming, Herbon E, MD  tiotropium (SPIRIVA ) 18 MCG inhalation capsule Place 1 capsule into inhaler and inhale daily. pm 08/27/13  Yes Theotis Lavelle BRAVO, MD  VENTOLIN  HFA 108 (90 Base) MCG/ACT inhaler INHALE 1 PUFF BY MOUTH AS NEEDED 04/26/20  Yes Sowles, Krichna, MD  zinc  sulfate, 50mg  elemental zinc , 220 (50 Zn) MG capsule Take 1 capsule (220 mg total) by mouth daily. 09/28/23  Yes Marsa Edelman, DO     Critical care provider statement:   Total critical care time: 32 minutes   Performed by: Parris MD   Critical care time was exclusive of separately billable procedures and treating other patients.   Critical care was necessary to treat or prevent imminent or life-threatening deterioration.   Critical care was time spent personally by me on the following activities: development of treatment plan with patient and/or surrogate as well as nursing, discussions with consultants, evaluation of patient's response to treatment, examination of patient, obtaining history from patient or surrogate, ordering and performing treatments and interventions, ordering and review of laboratory studies, ordering and review of radiographic studies, pulse oximetry and re-evaluation of patient's condition.    Tricia [name], M.D.  Pulmonary & Critical Care Medicine

## 2023-11-03 NOTE — Plan of Care (Signed)
  Problem: Fluid Volume: Goal: Hemodynamic stability will improve Outcome: Progressing   Problem: Clinical Measurements: Goal: Signs and symptoms of infection will decrease Outcome: Progressing   Problem: Respiratory: Goal: Ability to maintain adequate ventilation will improve Outcome: Progressing   Problem: Fluid Volume: Goal: Ability to maintain a balanced intake and output will improve Outcome: Progressing   Problem: Nutritional: Goal: Maintenance of adequate nutrition will improve Outcome: Progressing   Problem: Clinical Measurements: Goal: Respiratory complications will improve Outcome: Progressing   Problem: Safety: Goal: Ability to remain free from injury will improve Outcome: Progressing

## 2023-11-04 DIAGNOSIS — A419 Sepsis, unspecified organism: Secondary | ICD-10-CM | POA: Diagnosis not present

## 2023-11-04 DIAGNOSIS — L0390 Cellulitis, unspecified: Secondary | ICD-10-CM | POA: Diagnosis not present

## 2023-11-04 DIAGNOSIS — R6521 Severe sepsis with septic shock: Secondary | ICD-10-CM | POA: Diagnosis not present

## 2023-11-04 DIAGNOSIS — A498 Other bacterial infections of unspecified site: Secondary | ICD-10-CM | POA: Diagnosis not present

## 2023-11-04 LAB — CBC
HCT: 21.4 % — ABNORMAL LOW (ref 36.0–46.0)
Hemoglobin: 7.3 g/dL — ABNORMAL LOW (ref 12.0–15.0)
MCH: 31.6 pg (ref 26.0–34.0)
MCHC: 34.1 g/dL (ref 30.0–36.0)
MCV: 92.6 fL (ref 80.0–100.0)
Platelets: 70 K/uL — ABNORMAL LOW (ref 150–400)
RBC: 2.31 MIL/uL — ABNORMAL LOW (ref 3.87–5.11)
RDW: 14.6 % (ref 11.5–15.5)
WBC: 5.7 K/uL (ref 4.0–10.5)
nRBC: 0 % (ref 0.0–0.2)

## 2023-11-04 LAB — RENAL FUNCTION PANEL
Albumin: 1.9 g/dL — ABNORMAL LOW (ref 3.5–5.0)
Anion gap: 8 (ref 5–15)
BUN: 33 mg/dL — ABNORMAL HIGH (ref 8–23)
CO2: 33 mmol/L — ABNORMAL HIGH (ref 22–32)
Calcium: 7.3 mg/dL — ABNORMAL LOW (ref 8.9–10.3)
Chloride: 96 mmol/L — ABNORMAL LOW (ref 98–111)
Creatinine, Ser: 1.63 mg/dL — ABNORMAL HIGH (ref 0.44–1.00)
GFR, Estimated: 35 mL/min — ABNORMAL LOW (ref >60)
Glucose, Bld: 81 mg/dL (ref 70–99)
Phosphorus: 2.5 mg/dL (ref 2.5–4.6)
Potassium: 3.3 mmol/L — ABNORMAL LOW (ref 3.5–5.1)
Sodium: 137 mmol/L (ref 135–145)

## 2023-11-04 LAB — GLUCOSE, CAPILLARY
Glucose-Capillary: 104 mg/dL — ABNORMAL HIGH (ref 70–99)
Glucose-Capillary: 134 mg/dL — ABNORMAL HIGH (ref 70–99)
Glucose-Capillary: 135 mg/dL — ABNORMAL HIGH (ref 70–99)
Glucose-Capillary: 142 mg/dL — ABNORMAL HIGH (ref 70–99)
Glucose-Capillary: 144 mg/dL — ABNORMAL HIGH (ref 70–99)
Glucose-Capillary: 74 mg/dL (ref 70–99)
Glucose-Capillary: 86 mg/dL (ref 70–99)

## 2023-11-04 LAB — MAGNESIUM: Magnesium: 1.7 mg/dL (ref 1.7–2.4)

## 2023-11-04 LAB — HEPARIN INDUCED PLATELET AB (HIT ANTIBODY): Heparin Induced Plt Ab: 0.042 {OD_unit} (ref 0.000–0.400)

## 2023-11-04 MED ORDER — JUVEN PO PACK
1.0000 | PACK | Freq: Two times a day (BID) | ORAL | Status: DC
  Administered 2023-11-04: 1

## 2023-11-04 MED ORDER — DULOXETINE HCL 30 MG PO CPEP
60.0000 mg | ORAL_CAPSULE | Freq: Every day | ORAL | Status: DC
  Administered 2023-11-04 – 2023-11-16 (×13): 60 mg via ORAL
  Filled 2023-11-04 (×13): qty 2

## 2023-11-04 MED ORDER — INSULIN ASPART 100 UNIT/ML IJ SOLN
0.0000 [IU] | INTRAMUSCULAR | Status: DC
  Administered 2023-11-04 – 2023-11-05 (×6): 1 [IU] via SUBCUTANEOUS
  Administered 2023-11-05: 2 [IU] via SUBCUTANEOUS
  Administered 2023-11-06: 1 [IU] via SUBCUTANEOUS
  Filled 2023-11-04 (×8): qty 1

## 2023-11-04 MED ORDER — PROSOURCE TF20 ENFIT COMPATIBL EN LIQD
60.0000 mL | Freq: Every day | ENTERAL | Status: DC
  Filled 2023-11-04: qty 60

## 2023-11-04 MED ORDER — POTASSIUM CHLORIDE 10 MEQ/50ML IV SOLN
10.0000 meq | INTRAVENOUS | Status: AC
  Administered 2023-11-04 (×2): 10 meq via INTRAVENOUS
  Filled 2023-11-04 (×2): qty 50

## 2023-11-04 MED ORDER — ENSURE MAX PROTEIN PO LIQD
11.0000 [oz_av] | Freq: Two times a day (BID) | ORAL | Status: DC
  Administered 2023-11-04: 11 [oz_av] via ORAL

## 2023-11-04 MED ORDER — CARBIDOPA-LEVODOPA ER 50-200 MG PO TBCR
1.0000 | EXTENDED_RELEASE_TABLET | Freq: Every day | ORAL | Status: DC
  Administered 2023-11-04 – 2023-11-16 (×13): 1 via ORAL
  Filled 2023-11-04 (×14): qty 1

## 2023-11-04 MED ORDER — THEOPHYLLINE ER 400 MG PO TB24
400.0000 mg | ORAL_TABLET | Freq: Every day | ORAL | Status: DC
  Administered 2023-11-04 – 2023-11-17 (×14): 400 mg via ORAL
  Filled 2023-11-04 (×15): qty 1

## 2023-11-04 MED ORDER — FREE WATER
30.0000 mL | Status: DC
  Administered 2023-11-04 – 2023-11-05 (×4): 30 mL

## 2023-11-04 MED ORDER — OSMOLITE 1.5 CAL PO LIQD
1000.0000 mL | ORAL | Status: DC
  Administered 2023-11-04: 1000 mL

## 2023-11-04 MED ORDER — CARBIDOPA-LEVODOPA 10-100 MG PO TABS
1.0000 | ORAL_TABLET | Freq: Three times a day (TID) | ORAL | Status: DC
  Administered 2023-11-04 – 2023-11-17 (×37): 1 via ORAL
  Filled 2023-11-04 (×40): qty 1

## 2023-11-04 MED ORDER — MAGNESIUM SULFATE IN D5W 1-5 GM/100ML-% IV SOLN
1.0000 g | Freq: Once | INTRAVENOUS | Status: AC
  Administered 2023-11-04: 1 g via INTRAVENOUS
  Filled 2023-11-04: qty 100

## 2023-11-04 NOTE — Plan of Care (Signed)
  Problem: Fluid Volume: Goal: Hemodynamic stability will improve Outcome: Progressing   Problem: Clinical Measurements: Goal: Diagnostic test results will improve Outcome: Progressing Goal: Signs and symptoms of infection will decrease Outcome: Progressing   Problem: Respiratory: Goal: Ability to maintain adequate ventilation will improve Outcome: Progressing   Problem: Nutritional: Goal: Maintenance of adequate nutrition will improve Outcome: Progressing   Problem: Tissue Perfusion: Goal: Adequacy of tissue perfusion will improve Outcome: Progressing   Problem: Clinical Measurements: Goal: Respiratory complications will improve Outcome: Progressing

## 2023-11-04 NOTE — Progress Notes (Addendum)
 PROGRESS NOTE FLORENE [name]    DOB: 11-19-1960, 63 y.o.  FMW:[id number]    Code Status: Full Code   DOA: 10/30/2023   LOS: 5  Brief hospital course  Tricia [name] is a 63 y.o. female with a PMH significant for anxiety, osteoarthritis, asthma, COPD, depression, type 2 diabetes mellitus, GERD, dyslipidemia, and hypertension, who presented to the emergency room with acute onset of generalized weakness and bilateral lower extremity swelling after being found down after several hours.   ED Course: BP was 109/53 with heart rate of 114 and later BP was 96/52 and temperature was 99.8, respiratory rate was 22. Labs reveal hyponatremia 128 and hyperkalemia of 6 with a CO2 of 13 and glucose of 126, BUN of 42 and creatinine 3.64 calcium  of 7.8 and albumin  2 with total protein 5.7.  BNP was 78.5 and high-sensitivity troponin I was 14.  Lactic acid was 2.6 and CBC showed hemoglobin 11.8 and hematocrit 36.9.  Respiratory panel came back negative.  Blood cultures were drawn. EKG: sinus tachycardia with rate 118 with poor R wave progression. Imaging: Portable chest x-ray showed emphysema with no acute cardiopulmonary disease. The patient was given IV vancomycin  and  IV cefepime .  On initial evaluation- patient was lethargic and minimally responsive. BP low and not responding to IV fluids or increase in midodrine . CCM was consulted who took over care for patient who required vasopressors. Weaned from them 9/14. NG tube was placed for nutrition as she was unable to tolerate PO while incapacitated.   11/04/23 -transferred back to [facility] care.   Assessment & Plan  Principal Problem:   Sepsis due to cellulitis [facility]) Active Problems:   AKI (acute kidney injury) (HCC)   Hyperkalemia   Asthma, chronic   Hyponatremia   Parkinson's disease (HCC)   GERD without esophagitis   Dyslipidemia   Gout   Sepsis (HCC)   Leg swelling  Septic shock- shock has resolved. Weaned off pressors 9/14. Continue home  midodrine  increased to 10mg  Sepsis due to cellulitis of lower extremities(HCC)- manifested by tachycardia and hypotension, elevated LA, renal failure. See clinical image for further detail.  Serratia bacteremia- BCID positive.  - Continue antibiotic therapy with IV cefepime  transitioned to CTX - Wound care consult - continue IVF.  - PT/OT and dispo planning. Patient states she will not go to SNF - remove foley  Severe calorie malnutrition  - continue enteral feeds per RD until tolerating adequate PO nutrition - continuing albumin  supplementation  - slp following   Chronic hypoxia- on 2L [place] chronically at home. Remains on 2L currently  - continue breathing treatments  Depression- continue home duloxetine , seroquel     AKI  Acute renal failure- Cr significantly increased from baseline normal to 3.64 on presentation. Greatly improved. Cr now 1.63. did not require HD - Will follow BMP. - Will avoid nephrotoxins. - nephrology has been consulted, appreciate your care  Diabetes- diet controlled. A1c 5.1.  - on sliding scale currently while getting enteral feeds. Can likely be discontinued when removed   Hyponatremia  Hyperkalemia- resolved, now slightly hypokalemia. Addressed with nutritional supplements and pharmacy consulted for management  - It is likely secondary to AKI. - follow BMP   Asthma, chronic - continue theophylline , Singulair , and bronchodilator inhaler.   GERD without esophagitis -continue PPI therapy.   Parkinson's disease (HCC) - continue Sinemet  CR and Sinemet  IR.   Dyslipidemia- not on statin   Gout- not acutely exacerbated  Body mass index is 23.87 kg/m.  VTE ppx:   Diet:     Diet   DIET DYS 3 Room service appropriate? Yes with Assist; Fluid consistency: Thin   Consultants: CCM Nephrology   Subjective 11/04/23    Pt reports feeling much better. She very much wants to go home. States that she has not been out of bed since arrival to the  hospital. Has lost 3-4 days of memory of her stay. Denies pain.    Objective  Blood pressure (!) 95/50, pulse (!) 109, temperature 99.5 F (37.5 C), temperature source Axillary, resp. rate 14, height 5' 8 (1.727 m), weight 70.3 kg, SpO2 100%.  Intake/Output Summary (Last 24 hours) at 11/04/2023 0736 Last data filed at 11/04/2023 0541 Gross per 24 hour  Intake 1245.35 ml  Output 1400 ml  Net -154.65 ml   Filed Weights   11/02/23 0500 11/03/23 0500 11/04/23 0500  Weight: 71.7 kg 69.2 kg 71.2 kg    Physical Exam:  General: alert, resting in bed. NAD Respiratory: normal respiratory effort. CTAB Cardiovascular: normal S1/S2, RRR, no JVD, murmurs Gastrointestinal: soft, NT, ND Nervous: Alert and oriented no focal abnormalities  Extremities: trace edema to bilateral lower extremities. Erythema much improved. No drainage  Labs   I have personally reviewed the following labs and imaging studies CBC    Component Value Date/Time   WBC 5.7 11/04/2023 0433   RBC 2.31 (L) 11/04/2023 0433   HGB 7.3 (L) 11/04/2023 0433   HGB 13.7 11/16/2013 2040   HCT 21.4 (L) 11/04/2023 0433   HCT 40.9 11/16/2013 2040   PLT 70 (L) 11/04/2023 0433   PLT 254 11/16/2013 2040   MCV 92.6 11/04/2023 0433   MCV 94 11/16/2013 2040   MCH 31.6 11/04/2023 0433   MCHC 34.1 11/04/2023 0433   RDW 14.6 11/04/2023 0433   RDW 14.0 11/16/2013 2040   LYMPHSABS 0.4 (L) 11/02/2023 1215   LYMPHSABS 2.7 11/16/2013 2040   MONOABS 0.2 11/02/2023 1215   MONOABS 0.6 11/16/2013 2040   EOSABS 0.0 11/02/2023 1215   EOSABS 0.1 11/16/2013 2040   BASOSABS 0.0 11/02/2023 1215   BASOSABS 0.1 11/16/2013 2040   BASOSABS 0 11/15/2011 1430      Latest Ref Rng & Units 11/04/2023    4:33 AM 11/03/2023    2:59 PM 11/03/2023    3:20 AM  BMP  Glucose 70 - 99 mg/dL 81  831  898   BUN 8 - 23 mg/dL 33  38  41   Creatinine 0.44 - 1.00 mg/dL 8.36  8.05  7.84   Sodium 135 - 145 mmol/L 137  134  136   Potassium 3.5 - 5.1 mmol/L 3.3  3.4   3.0   Chloride 98 - 111 mmol/L 96  93  96   CO2 22 - 32 mmol/L 33  32  30   Calcium  8.9 - 10.3 mg/dL 7.3  7.0  6.6     DG Abd 1 View Result Date: 11/03/2023 CLINICAL DATA:  63 year old female. Enteric tube placement. Abdominal distension. EXAM: ABDOMEN - 1 VIEW COMPARISON:  11/01/2023 and earlier. FINDINGS: Portable AP supine views at 0529 hours. Enteric tube terminates in the stomach, side hole the level of the gastric fundus. Lung bases appear stable and negative. Non obstructed bowel gas pattern. Left femoral approach catheter in place. Aortoiliac calcified atherosclerosis. No acute osseous abnormality identified. IMPRESSION: 1. Stable and satisfactory enteric tube placement in the stomach. 2. Non obstructed bowel gas pattern. 3.  Aortic Atherosclerosis (ICD10-I70.0). Electronically Signed  By: VEAR Hurst M.D.   On: 11/03/2023 09:42    Disposition Plan & Communication  Patient status: Inpatient  Admitted From: Home Planned disposition location: TBD Anticipated discharge date: TBD pending clinical development   Family Communication: none at bedside    Author: Marien LITTIE Piety, DO [facility] 11/04/2023, 7:36 AM   Available by Epic secure chat 7AM-7PM. If 7PM-7AM, please contact night-coverage.  [facility] contact information found on [email or web address].

## 2023-11-04 NOTE — Plan of Care (Signed)
  Problem: Clinical Measurements: Goal: Diagnostic test results will improve Outcome: Progressing Goal: Signs and symptoms of infection will decrease Outcome: Progressing   Problem: Respiratory: Goal: Ability to maintain adequate ventilation will improve Outcome: Progressing   Problem: Pain Managment: Goal: General experience of comfort will improve and/or be controlled Outcome: Progressing

## 2023-11-04 NOTE — Consult Note (Signed)
 PHARMACY CONSULT NOTE - ELECTROLYTES  Pharmacy Consult for Electrolyte Monitoring and Replacement   Recent Labs: Height: 5' 8 (172.7 cm) Weight: 71.2 kg (156 lb 15.5 oz) IBW/kg (Calculated) : 63.9 Estimated Creatinine Clearance: 35.6 mL/min (A) (by C-G formula based on SCr of 1.63 mg/dL (H)). Potassium (mmol/L)  Date Value  11/04/2023 3.3 (L)  11/16/2013 4.0   Magnesium  (mg/dL)  Date Value  90/85/7974 1.7   Calcium  (mg/dL)  Date Value  90/84/7974 7.3 (L)   Calcium , Total (mg/dL)  Date Value  90/71/7984 7.9 (L)   Albumin  (g/dL)  Date Value  90/84/7974 1.9 (L)  07/06/2015 4.2  10/07/2012 2.9 (L)   Phosphorus (mg/dL)  Date Value  90/84/7974 2.5   Sodium (mmol/L)  Date Value  11/04/2023 137  07/06/2015 146 (H)  11/16/2013 131 (L)    Assessment  Tricia [name] is a 63 y.o. female presenting with weakness and leg swelling. PMH significant for anxiety, osteoarthritis, asthma, COPD, depression, type 2 diabetes mellitus, GERD, dyslipidemia, and hypertension  . Pharmacy has been consulted to monitor and replace electrolytes.  Diet: dysphagia 3 diet MIVF: N/A Pertinent medications: N/A  Goal of Therapy: Electrolytes WNL  9/15: K 3.3, Phos 2.5, Mg 1.7   Plan:  Kcl 10 mEq IV q1h x 2 ordered by provider  Mg sulfate 1 g IV x 1  Check renal function panel and Mg with AM labs  Thank you for allowing pharmacy to be a part of this patient's care.   Ransom Blanch PGY-1 Pharmacy Resident  [place] [facility]  11/04/2023 7:22 AM

## 2023-11-04 NOTE — Progress Notes (Addendum)
 [facility]  ROUNDING NOTE   Subjective:   Patient seen laying in bed in ICU Sitting up in bed Preparing breakfast  NG tube is in place, aspirate noted Foley bag , urine output Serum creatinine is lower at 1.63 Tube feeds  Objective:  Vital signs in last 24 hours:  Temp:  [97.8 F (36.6 C)-98.5 F (36.9 C)] 98.5 F (36.9 C) (09/15 0900) Pulse Rate:  [80-96] 89 (09/15 0900) Resp:  [8-19] 13 (09/15 0900) BP: (111-134)/(47-90) 123/63 (09/15 0900) SpO2:  [100 %] 100 % (09/15 0900) Weight:  [71.2 kg] 71.2 kg (09/15 0500)  Weight change: 2 kg Filed Weights   11/02/23 0500 11/03/23 0500 11/04/23 0500  Weight: 71.7 kg 69.2 kg 71.2 kg    Intake/Output: I/O last 3 completed shifts: In: 2760.7 [P.O.:120; I.V.:2191.4; Other:20; NG/GT:30; IV Piggyback:399.3] Out: 2365 [Urine:2165; Emesis/NG output:200]   Intake/Output this shift:  Total I/O In: 50 [P.O.:50] Out: 40 [Urine:40]  Physical Exam: General: NAD  Head: Normocephalic, atraumatic.  NG tube in place  Eyes: Anicteric  Lungs:  Clear to auscultation  Heart: Regular rate and rhythm  Abdomen:  Soft, nontender  Extremities:  + peripheral edema.  Neurologic: Awake, alert, conversant  Skin: BLE erythema   GU Foley catheter    Basic Metabolic Panel: Recent Labs  Lab 11/01/23 0352 11/02/23 0400 11/03/23 0320 11/03/23 1459 11/04/23 0433  NA 131* 132* 136 134* 137  K 4.5 3.6 3.0* 3.4* 3.3*  CL 102 100 96* 93* 96*  CO2 18* 24 30 32 33*  GLUCOSE 217* 131* 101* 168* 81  BUN 51* 48* 41* 38* 33*  CREATININE 3.51* 2.97* 2.15* 1.94* 1.63*  CALCIUM  7.2* 6.9* 6.6* 7.0* 7.3*  MG 1.6* 2.0 1.8 1.7 1.7  PHOS 5.3* 3.9 2.6  --  2.5    Liver Function Tests: Recent Labs  Lab 10/30/23 1928 11/01/23 0352 11/02/23 0400 11/03/23 0320 11/04/23 0433  AST 25  --   --   --   --   ALT 9  --   --   --   --   ALKPHOS 123  --   --   --   --   BILITOT 1.2  --   --   --   --   PROT 5.7*  --   --   --   --    ALBUMIN  2.0* <1.5* 1.5* <1.5* 1.9*   No results for input(s): LIPASE, AMYLASE in the last 168 hours. No results for input(s): AMMONIA in the last 168 hours.  CBC: Recent Labs  Lab 10/30/23 1928 10/31/23 0610 11/01/23 0352 11/02/23 0400 11/02/23 1215 11/03/23 0320 11/04/23 0433  WBC 10.1   < > 15.6* 4.4 4.4 5.4 5.7  NEUTROABS 8.9*  --   --   --  3.7  --   --   HGB 11.8*   < > 9.4* 7.2* 8.0* 8.1* 7.3*  HCT 36.9   < > 27.4* 21.2* 23.1* 23.0* 21.4*  MCV 97.4   < > 93.5 92.2 91.7 91.3 92.6  PLT 367   < > 146* 67* 61* 57* 70*   < > = values in this interval not displayed.    Cardiac Enzymes: Recent Labs  Lab 10/30/23 2213  CKTOTAL 38    BNP: Invalid input(s): POCBNP  CBG: Recent Labs  Lab 11/03/23 2017 11/03/23 2142 11/04/23 0158 11/04/23 0625 11/04/23 0725  GLUCAP 94 144* 104* 74 86    Microbiology: Results for orders placed or performed  during the hospital encounter of 10/30/23  Blood culture (routine x 2)     Status: Abnormal   Collection Time: 10/30/23  7:28 PM   Specimen: BLOOD  Result Value Ref Range Status   Specimen Description   Final    BLOOD LEFT ANTECUBITAL Performed at [facility], [address] [place], [place] [address]    Special Requests   Final    BLOOD Blood Culture adequate volume Performed at [facility], [address] [place], [place] [address]    Culture  Setup Time   Final    GRAM NEGATIVE RODS IN BOTH AEROBIC AND ANAEROBIC BOTTLES CRITICAL RESULT CALLED TO, READ BACK BY AND VERIFIED WITHBETHA ESTILL LUTES PHARMD [id number] 10/31/23 HNM GRAM STAIN REVIEWED-AGREE WITH RESULT DRT Performed at [facility], [address] [place], [place] [address]    Culture SERRATIA MARCESCENS (A)  Final   Report Status 11/02/2023 FINAL  Final   Organism ID, Bacteria SERRATIA MARCESCENS  Final      Susceptibility   Serratia marcescens - MIC*    CEFEPIME  <=0.12 SENSITIVE Sensitive     ERTAPENEM <=0.12 SENSITIVE  Sensitive     CEFTRIAXONE  <=0.25 SENSITIVE Sensitive     CIPROFLOXACIN  <=0.06 SENSITIVE Sensitive     GENTAMICIN <=1 SENSITIVE Sensitive     MEROPENEM  <=0.25 SENSITIVE Sensitive     TRIMETH /SULFA  <=20 SENSITIVE Sensitive     * SERRATIA MARCESCENS  Blood Culture ID Panel (Reflexed)     Status: Abnormal   Collection Time: 10/30/23  7:28 PM  Result Value Ref Range Status   Enterococcus faecalis NOT DETECTED NOT DETECTED Final   Enterococcus Faecium NOT DETECTED NOT DETECTED Final   Listeria monocytogenes NOT DETECTED NOT DETECTED Final   Staphylococcus species NOT DETECTED NOT DETECTED Final   Staphylococcus aureus (BCID) NOT DETECTED NOT DETECTED Final   Staphylococcus epidermidis NOT DETECTED NOT DETECTED Final   Staphylococcus lugdunensis NOT DETECTED NOT DETECTED Final   Streptococcus species NOT DETECTED NOT DETECTED Final   Streptococcus agalactiae NOT DETECTED NOT DETECTED Final   Streptococcus pneumoniae NOT DETECTED NOT DETECTED Final   Streptococcus pyogenes NOT DETECTED NOT DETECTED Final   A.calcoaceticus-baumannii NOT DETECTED NOT DETECTED Final   Bacteroides fragilis NOT DETECTED NOT DETECTED Final   Enterobacterales DETECTED (A) NOT DETECTED Final    Comment: Enterobacterales represent a large order of gram negative bacteria, not a single organism. CRITICAL RESULT CALLED TO, READ BACK BY AND VERIFIED WITH: SHEEMA HALLAJI PHARMD [id number] 10/31/23 HNM    Enterobacter cloacae complex NOT DETECTED NOT DETECTED Final   Escherichia coli NOT DETECTED NOT DETECTED Final   Klebsiella aerogenes NOT DETECTED NOT DETECTED Final   Klebsiella oxytoca NOT DETECTED NOT DETECTED Final   Klebsiella pneumoniae NOT DETECTED NOT DETECTED Final   Proteus species NOT DETECTED NOT DETECTED Final   Salmonella species NOT DETECTED NOT DETECTED Final   Serratia marcescens DETECTED (A) NOT DETECTED Final    Comment: CRITICAL RESULT CALLED TO, READ BACK BY AND VERIFIED WITH: ESTILL LUTES PHARMD [id number]  10/31/23 HNM    Haemophilus influenzae NOT DETECTED NOT DETECTED Final   Neisseria meningitidis NOT DETECTED NOT DETECTED Final   Pseudomonas aeruginosa NOT DETECTED NOT DETECTED Final   Stenotrophomonas maltophilia NOT DETECTED NOT DETECTED Final   Candida albicans NOT DETECTED NOT DETECTED Final   Candida auris NOT DETECTED NOT DETECTED Final   Candida glabrata NOT DETECTED NOT DETECTED Final   Candida krusei NOT DETECTED NOT DETECTED Final  Candida parapsilosis NOT DETECTED NOT DETECTED Final   Candida tropicalis NOT DETECTED NOT DETECTED Final   Cryptococcus neoformans/gattii NOT DETECTED NOT DETECTED Final   CTX-M ESBL NOT DETECTED NOT DETECTED Final   Carbapenem resistance IMP NOT DETECTED NOT DETECTED Final   Carbapenem resistance KPC NOT DETECTED NOT DETECTED Final   Carbapenem resistance NDM NOT DETECTED NOT DETECTED Final   Carbapenem resist OXA 48 LIKE NOT DETECTED NOT DETECTED Final   Carbapenem resistance VIM NOT DETECTED NOT DETECTED Final    Comment: Performed at [facility], [address] [place], [place] [address]  Blood culture (routine x 2)     Status: Abnormal   Collection Time: 10/30/23  7:55 PM   Specimen: BLOOD  Result Value Ref Range Status   Specimen Description   Final    BLOOD BLOOD RIGHT ARM Performed at [facility], [address] [place], [place] [address]    Special Requests   Final    BOTTLES DRAWN AEROBIC AND ANAEROBIC Blood Culture results may not be optimal due to an inadequate volume of blood received in culture bottles Performed at [facility], [address] [place], [place] [address]    Culture  Setup Time   Final    GRAM NEGATIVE RODS IN BOTH AEROBIC AND ANAEROBIC BOTTLES CRITICAL VALUE NOTED.  VALUE IS CONSISTENT WITH PREVIOUSLY REPORTED AND CALLED VALUE. GRAM STAIN REVIEWED-AGREE WITH RESULT DRT    Culture (A)  Final    SERRATIA MARCESCENS SUSCEPTIBILITIES PERFORMED ON PREVIOUS CULTURE WITHIN THE  LAST 5 DAYS. Performed at [facility], [address] [place], [place] [address]    Report Status 11/02/2023 FINAL  Final  Resp panel by RT-PCR (RSV, Flu A&B, Covid) Anterior Nasal Swab     Status: None   Collection Time: 10/30/23  8:01 PM   Specimen: Anterior Nasal Swab  Result Value Ref Range Status   SARS Coronavirus 2 by RT PCR NEGATIVE NEGATIVE Final    Comment: (NOTE) SARS-CoV-2 target nucleic acids are NOT DETECTED.  The SARS-CoV-2 RNA is generally detectable in upper respiratory specimens during the acute phase of infection. The lowest concentration of SARS-CoV-2 viral copies this assay can detect is 138 copies/mL. A negative result does not preclude SARS-Cov-2 infection and should not be used as the sole basis for treatment or other patient management decisions. A negative result may occur with  improper specimen collection/handling, submission of specimen other than nasopharyngeal swab, presence of viral mutation(s) within the areas targeted by this assay, and inadequate number of viral copies(<138 copies/mL). A negative result must be combined with clinical observations, patient history, and epidemiological information. The expected result is Negative.  Fact Sheet for Patients:  BloggerCourse.com  Fact Sheet for Healthcare Providers:  SeriousBroker.it  This test is no t yet approved or cleared by the [place]  FDA and  has been authorized for detection and/or diagnosis of SARS-CoV-2 by FDA under an Emergency Use Authorization (EUA). This EUA will remain  in effect (meaning this test can be used) for the duration of the COVID-19 declaration under Section 564(b)(1) of the Act, 21 U.S.C.section 360bbb-3(b)(1), unless the authorization is terminated  or revoked sooner.       Influenza A by PCR NEGATIVE NEGATIVE Final   Influenza B by PCR NEGATIVE NEGATIVE Final    Comment: (NOTE) The Xpert Xpress  SARS-CoV-2/FLU/RSV plus assay is intended as an aid in the diagnosis of influenza from Nasopharyngeal swab specimens and should not be used  as a sole basis for treatment. Nasal washings and aspirates are unacceptable for Xpert Xpress SARS-CoV-2/FLU/RSV testing.  Fact Sheet for Patients: BloggerCourse.com  Fact Sheet for Healthcare Providers: SeriousBroker.it  This test is not yet approved or cleared by the [place]  FDA and has been authorized for detection and/or diagnosis of SARS-CoV-2 by FDA under an Emergency Use Authorization (EUA). This EUA will remain in effect (meaning this test can be used) for the duration of the COVID-19 declaration under Section 564(b)(1) of the Act, 21 U.S.C. section 360bbb-3(b)(1), unless the authorization is terminated or revoked.     Resp Syncytial Virus by PCR NEGATIVE NEGATIVE Final    Comment: (NOTE) Fact Sheet for Patients: BloggerCourse.com  Fact Sheet for Healthcare Providers: SeriousBroker.it  This test is not yet approved or cleared by the [place]  FDA and has been authorized for detection and/or diagnosis of SARS-CoV-2 by FDA under an Emergency Use Authorization (EUA). This EUA will remain in effect (meaning this test can be used) for the duration of the COVID-19 declaration under Section 564(b)(1) of the Act, 21 U.S.C. section 360bbb-3(b)(1), unless the authorization is terminated or revoked.  Performed at [facility], [address] [place], [place] [address]   MRSA Next Gen by PCR, Nasal     Status: None   Collection Time: 10/31/23  2:05 PM   Specimen: Nasal Mucosa; Nasal Swab  Result Value Ref Range Status   MRSA by PCR Next Gen NOT DETECTED NOT DETECTED Final    Comment: (NOTE) The GeneXpert MRSA Assay (FDA approved for NASAL specimens only), is one component of a comprehensive MRSA colonization  surveillance program. It is not intended to diagnose MRSA infection nor to guide or monitor treatment for MRSA infections. Test performance is not FDA approved in patients less than 37 years old. Performed at [facility], [address] [place], [place] [address]     Coagulation Studies: No results for input(s): LABPROT, INR in the last 72 hours.   Urinalysis: No results for input(s): COLORURINE, LABSPEC, PHURINE, GLUCOSEU, HGBUR, BILIRUBINUR, KETONESUR, PROTEINUR, UROBILINOGEN, NITRITE, LEUKOCYTESUR in the last 72 hours.  Invalid input(s): APPERANCEUR     Imaging: DG Abd 1 View Result Date: 11/03/2023 CLINICAL DATA:  63 year old female. Enteric tube placement. Abdominal distension. EXAM: ABDOMEN - 1 VIEW COMPARISON:  11/01/2023 and earlier. FINDINGS: Portable AP supine views at 0529 hours. Enteric tube terminates in the stomach, side hole the level of the gastric fundus. Lung bases appear stable and negative. Non obstructed bowel gas pattern. Left femoral approach catheter in place. Aortoiliac calcified atherosclerosis. No acute osseous abnormality identified. IMPRESSION: 1. Stable and satisfactory enteric tube placement in the stomach. 2. Non obstructed bowel gas pattern. 3.  Aortic Atherosclerosis (ICD10-I70.0). Electronically Signed   By: VEAR Hurst M.D.   On: 11/03/2023 09:42     Medications:    albumin  human 12.5 g (11/04/23 0840)   cefTRIAXone  (ROCEPHIN )  IV Stopped (11/03/23 1547)   magnesium  sulfate bolus IVPB     potassium chloride  10 mEq (11/04/23 0850)    budesonide  (PULMICORT ) nebulizer solution  0.5 mg Nebulization BID   Chlorhexidine  Gluconate Cloth  6 each Topical Daily   feeding supplement  237 mL Oral BID BM   insulin  aspart  0-9 Units Subcutaneous Q4H   ipratropium-albuterol   3 mL Nebulization BID   leptospermum manuka honey  1 Application Topical Daily   lidocaine   1 patch Transdermal Q24H   midodrine   10 mg Per NG tube  TID WC  multivitamin with minerals  1 tablet Per Tube Daily   pantoprazole  (PROTONIX ) IV  40 mg Intravenous Q24H   QUEtiapine   25 mg Oral QHS   thiamine   100 mg Per Tube Daily   acetaminophen , acetaminophen , [DISCONTINUED] insulin  aspart **AND** dextrose  **AND** POCT CBG monitoring, ipratropium-albuterol , morphine  injection, [DISCONTINUED] ondansetron  **OR** ondansetron  (ZOFRAN ) IV, mouth rinse, oxyCODONE   Assessment/ Plan:  Tricia [name] is a 63 y.o.  female with past medical history including COPD, type 2 diabetes, anxiety, depression, GERD and hypertension, who was admitted to [facility] on 10/30/2023 for Hyperkalemia [E87.5] Hyponatremia [E87.1] Weakness [R53.1] Leg swelling [M79.89] Wound infection [T14.8XXA, L08.9] AKI (acute kidney injury) (HCC) [N17.9] Sepsis (HCC) [A41.9] Sepsis due to cellulitis (HCC) [L03.90, A41.9] Sepsis, due to unspecified organism, unspecified whether acute organ dysfunction present (HCC) [A41.9]   Acute kidney injury likely secondary to multiple factors: infectious process (gram-negative sepsis), hypotension and volume depletion. Normal renal function noted last month. Found down by son. CK 58. Renal US -nonobstructive left kidney stone.  Creatinine continues to improve. Foley catheter in place.    Lab Results  Component Value Date   CREATININE 1.63 (H) 11/04/2023   CREATININE 1.94 (H) 11/03/2023   CREATININE 2.15 (H) 11/03/2023    Intake/Output Summary (Last 24 hours) at 11/04/2023 1031 Last data filed at 11/04/2023 1000 Gross per 24 hour  Intake 947.29 ml  Output 1210 ml  Net -262.71 ml   2. Acute metabolic acidosis, S bicarb 13->24->32->33.   - Sodium bicarbonate  has corrected and bicarb drip has been discontinued.  3. Anemia in chronic kidney disease Lab Results  Component Value Date   HGB 7.3 (L) 11/04/2023    Hemoglobin low.  Monitor for bleeding.  4. Sepsis secondary to cellulitis, bilateral lower extremities.  Gram-negative  sepsis.   Antibiotics as per primary team, ceftriaxone .  5.  Hypokalemia Levels remain decreased, 3.3 Patient is getting IV replacement.   LOS: 5 Shantelle Breeze 9/15/202510:31 AM

## 2023-11-04 NOTE — Progress Notes (Signed)
 Nutrition Follow-up  DOCUMENTATION CODES:   Severe malnutrition in context of chronic illness  INTERVENTION:   Osmolite 1.5@55ml /hr- Initiate at 29ml/hr and increase by 10ml/hr q 8 hours until goal rate is reached.   ProSource TF 20- Give 60ml daily via tube, each supplement provides 80kcal and 20g of protein.   Free water  flushes 30ml q4 hours to maintain tube patency   Regimen provides 2060kcal/day, 103g/day protein and 1155ml/day of free water .   Pt remains at high refeed risk; recommend monitor potassium, magnesium  and phosphorus labs daily until stable  Juven Fruit Punch BID via tube, each serving provides 95kcal and 2.5g of protein (amino acids glutamine and arginine)  MVI po daily   Thiamine  100mg  daily via tube x 7 days   Daily weights   Ensure Max protein supplement po BID, each supplement provides 150kcal and 30g of protein.  Check vitamin D  level   NUTRITION DIAGNOSIS:   Severe Malnutrition related to chronic illness (COPD) as evidenced by moderate fat depletion, severe fat depletion, moderate muscle depletion, severe muscle depletion, percent weight loss. -ongoing   GOAL:   Patient will meet greater than or equal to 90% of their needs -not met   MONITOR:   PO intake, Supplement acceptance, Labs, Weight trends, TF tolerance, I & O's, Skin  ASSESSMENT:   63 y/o female with h/o CKD III, HLD, Parkinson's dementia, anxiety, MDD, COPD, GERD, DM, CHF, chronic pain, HTN, gout, HOH, tremor and recent admission for syncope, AKI, orthostatic hypotension, polypharmacy and urinary retention and who is now admitted with cellulitis of bilateral lower extremities, bacteremia, septic shock, AKI and COPD exacerbation.  Met with pt in room today. Pt reports that her appetite and oral intake is poor; pt documented to be eating <25% of meals. Pt ate 20% of her breakfast this morning but reports that she does not like eggs and reports the muffin was too dry. NGT remains in  place. RD discussed with pt the importance of adequate nutrition needed to preserve lean muscle and support wound healing. Discussed with pt the recommendation to restart tube feeds until her appetite improves. Pt is agreeable to restart the feeds. Pt can continue to eat with NGT in place. Pt remains at high refeed risk. Will add Juven to support wound healing. Of note, pt with vitamin D  deficiency noted during her last admission; will recheck level tomorrow. Pt agreeable to continue strawberry Ensure. Per chart, pt is weight stable since admission.   Medications reviewed and include: insulin , midodrine , MVI, albumin , ceftriaxone , Mg sulfate  Labs reviewed: K 3.3(L), BUN 33(H), creat 1.63(H), P 2.5 wnl, Mg 1.7 wnl  Vitamin D  17.44(L)- 7/25 Hgb 7.3(L), Hct 21.4(L) Cbgs- 135, 86, 74, 104 x 24 hrs   Diet Order:   Diet Order             DIET DYS 3 Room service appropriate? Yes with Assist; Fluid consistency: Thin  Diet effective now                  EDUCATION NEEDS:   Education needs have been addressed  Skin:  Skin Assessment: Skin Integrity Issues: Skin Integrity Issues:: Other (Comment), DTI DTI: sacrum Other: full thickness to trauma wounds to bilateral lower legs  Last BM:  9/14- type 5  Height:   Ht Readings from Last 1 Encounters:  10/31/23 5' 8 (1.727 m)    Weight:   Wt Readings from Last 1 Encounters:  11/04/23 71.2 kg    Ideal Body Weight:  63.6 kg  BMI:  Body mass index is 23.87 kg/m.  Estimated Nutritional Needs:   Kcal:  1700-2000kcal/day  Protein:  85-100g/day  Fluid:  1.7-2.0L/day  Augustin Shams MS, RD, LDN If unable to be reached, please send secure chat to RD inpatient available from 8:00a-4:00p daily

## 2023-11-04 NOTE — Progress Notes (Signed)
 SLP Cancellation Note  Patient Details Name: JESSAMY TOROSYAN MRN: [id number] DOB: [birth year]   Cancelled treatment:       Reason Eval/Treat Not Completed: Other (comment) SLP spoke with pt's nursing team, who denied issue with PO intake, with concern of limited intake/appetite. Pt remains afebrile, on 2L nasal canula, without updated chest imaging. Given overt stability with current diet and given limitation of lack of natural dentition, recommend continued mech soft and thin liquids with general aspiration precautions, slow rate, small bites, elevated HOB, and alert for Po intake.   MD and dietician aware of recommendations. No further acute SLP services indicated.   [place] Asna Muldrow Clapp, MS, CCC-SLP Speech Language Pathologist Rehab Services; [facility] [phone] (ascom)   [place] J Clapp 11/04/2023, 11:40 AM

## 2023-11-05 DIAGNOSIS — A498 Other bacterial infections of unspecified site: Secondary | ICD-10-CM | POA: Diagnosis not present

## 2023-11-05 DIAGNOSIS — L0390 Cellulitis, unspecified: Secondary | ICD-10-CM | POA: Diagnosis not present

## 2023-11-05 DIAGNOSIS — R6521 Severe sepsis with septic shock: Secondary | ICD-10-CM | POA: Diagnosis not present

## 2023-11-05 DIAGNOSIS — A419 Sepsis, unspecified organism: Secondary | ICD-10-CM | POA: Diagnosis not present

## 2023-11-05 LAB — GLUCOSE, CAPILLARY
Glucose-Capillary: 158 mg/dL — ABNORMAL HIGH (ref 70–99)
Glucose-Capillary: 136 mg/dL — ABNORMAL HIGH (ref 70–99)
Glucose-Capillary: 126 mg/dL — ABNORMAL HIGH (ref 70–99)
Glucose-Capillary: 85 mg/dL (ref 70–99)
Glucose-Capillary: 105 mg/dL — ABNORMAL HIGH (ref 70–99)

## 2023-11-05 LAB — CBC
HCT: 23.8 % — ABNORMAL LOW (ref 36.0–46.0)
Hemoglobin: 8 g/dL — ABNORMAL LOW (ref 12.0–15.0)
MCH: 31.9 pg (ref 26.0–34.0)
MCHC: 33.6 g/dL (ref 30.0–36.0)
MCV: 94.8 fL (ref 80.0–100.0)
Platelets: 129 K/uL — ABNORMAL LOW (ref 150–400)
RBC: 2.51 MIL/uL — ABNORMAL LOW (ref 3.87–5.11)
RDW: 14.6 % (ref 11.5–15.5)
WBC: 8.8 K/uL (ref 4.0–10.5)
nRBC: 0 % (ref 0.0–0.2)

## 2023-11-05 LAB — BASIC METABOLIC PANEL WITH GFR
Anion gap: 12 (ref 5–15)
BUN: 27 mg/dL — ABNORMAL HIGH (ref 8–23)
CO2: 30 mmol/L (ref 22–32)
Calcium: 7.5 mg/dL — ABNORMAL LOW (ref 8.9–10.3)
Chloride: 93 mmol/L — ABNORMAL LOW (ref 98–111)
Creatinine, Ser: 1.45 mg/dL — ABNORMAL HIGH (ref 0.44–1.00)
GFR, Estimated: 41 mL/min — ABNORMAL LOW (ref >60)
Glucose, Bld: 127 mg/dL — ABNORMAL HIGH (ref 70–99)
Potassium: 3.6 mmol/L (ref 3.5–5.1)
Sodium: 135 mmol/L (ref 135–145)

## 2023-11-05 LAB — MAGNESIUM: Magnesium: 1.9 mg/dL (ref 1.7–2.4)

## 2023-11-05 LAB — VITAMIN D 25 HYDROXY (VIT D DEFICIENCY, FRACTURES): Vit D, 25-Hydroxy: 26.14 ng/mL — ABNORMAL LOW (ref 30–100)

## 2023-11-05 MED ORDER — ADULT MULTIVITAMIN W/MINERALS CH
1.0000 | ORAL_TABLET | Freq: Every day | ORAL | Status: DC
  Administered 2023-11-05 – 2023-11-17 (×13): 1 via ORAL
  Filled 2023-11-05 (×13): qty 1

## 2023-11-05 MED ORDER — MIDODRINE HCL 5 MG PO TABS
5.0000 mg | ORAL_TABLET | Freq: Three times a day (TID) | ORAL | Status: DC
Start: 2023-11-05 — End: 2023-11-06
  Administered 2023-11-05: 5 mg via ORAL
  Filled 2023-11-05: qty 1

## 2023-11-05 MED ORDER — PANCRELIPASE (LIP-PROT-AMYL) 12000-38000 UNITS PO CPEP
12000.0000 [IU] | ORAL_CAPSULE | Freq: Three times a day (TID) | ORAL | Status: DC
  Administered 2023-11-05 – 2023-11-17 (×35): 12000 [IU] via ORAL
  Filled 2023-11-05 (×38): qty 1

## 2023-11-05 MED ORDER — GABAPENTIN 300 MG PO CAPS
300.0000 mg | ORAL_CAPSULE | Freq: Three times a day (TID) | ORAL | Status: DC
  Administered 2023-11-05 – 2023-11-06 (×3): 300 mg via ORAL
  Filled 2023-11-05 (×3): qty 1

## 2023-11-05 MED ORDER — THIAMINE MONONITRATE 100 MG PO TABS
100.0000 mg | ORAL_TABLET | Freq: Every day | ORAL | Status: AC
  Administered 2023-11-06 – 2023-11-07 (×2): 100 mg via ORAL
  Filled 2023-11-05 (×2): qty 1

## 2023-11-05 MED ORDER — ENSURE MAX PROTEIN PO LIQD: 11.0000 [oz_av] | Freq: Three times a day (TID) | ORAL | Status: DC

## 2023-11-05 MED ORDER — MIDODRINE HCL 5 MG PO TABS
10.0000 mg | ORAL_TABLET | Freq: Three times a day (TID) | ORAL | Status: DC
Start: 2023-11-05 — End: 2023-11-05

## 2023-11-05 MED ORDER — POTASSIUM CHLORIDE 20 MEQ PO PACK
20.0000 meq | PACK | Freq: Once | ORAL | Status: AC
  Administered 2023-11-05: 20 meq via ORAL
  Filled 2023-11-05: qty 1

## 2023-11-05 MED ORDER — ENSURE PLUS HIGH PROTEIN PO LIQD
237.0000 mL | Freq: Two times a day (BID) | ORAL | Status: DC
  Administered 2023-11-05 – 2023-11-12 (×6): 237 mL via ORAL

## 2023-11-05 NOTE — Plan of Care (Signed)
   Problem: Fluid Volume: Goal: Hemodynamic stability will improve Outcome: Progressing

## 2023-11-05 NOTE — Progress Notes (Signed)
 Physical Therapy Treatment Patient Details Name: Tricia [name] MRN: [id number] DOB: [birth year] Today's Date: 11/05/2023   History of Present Illness Pt is a 63 y.o. female with medical history significant of COPD, prediabetes, hyperlipidemia, GERD, migraine headaches, anxiety, depression, benign essential tremor that presented to ED for BLE swelling, weakness. Workup for cellulitis of BLE, AKI, admission complicated by septic shock and acute COPD exacerbation.    PT Comments  Pt received in bed requesting to get OOB and motivated to participate. Mod/MaxA for bed mobility with log roll technique due to chronic back pain. Pt tolerated sitting EOB for 10+ minutes with distant supervision, no LOB. Attempted standing from raised bed to RW with MaxA, pt unable due to significant c/o B foot neuropathy pain. Pt takes Neurontin  2x/day at home, currently not ordered. MD and Nursing notified. Pt assisted back to bed in Right side lying with all needs in reach. Anticipate functional improvement as pain is better controlled. Initial recs for STR at d/c remain appropriate.    If plan is discharge home, recommend the following: Two people to help with walking and/or transfers;Two people to help with bathing/dressing/bathroom;Help with stairs or ramp for entrance;Assist for transportation;Assistance with feeding;Assistance with cooking/housework   Can travel by private vehicle     No  Equipment Recommendations  Other (comment) (TBD)    Recommendations for Other Services       Precautions / Restrictions Precautions Precautions: Fall Recall of Precautions/Restrictions: Impaired Restrictions Weight Bearing Restrictions Per Provider Order: No     Mobility  Bed Mobility Overal bed mobility: Needs Assistance Bed Mobility: Rolling, Sidelying to Sit, Sit to Sidelying Rolling: Mod assist, Used rails Sidelying to sit: Mod assist, Max assist     Sit to sidelying: Mod assist, Max assist General  bed mobility comments: 10+ minutes sitting EOB with Supervision    Transfers Overall transfer level: Needs assistance Equipment used: Rolling walker (2 wheels) Transfers: Sit to/from Stand Sit to Stand: Max assist, From elevated surface           General transfer comment:  (Unable to weight bear through LE's due to neuropathy pain in feet)    Ambulation/Gait               General Gait Details:  (Unable)   Stairs             Wheelchair Mobility     Tilt Bed    Modified Rankin (Stroke Patients Only)       Balance                                            Communication Communication Communication: No apparent difficulties  Cognition Arousal: Alert Behavior During Therapy: WFL for tasks assessed/performed   PT - Cognitive impairments: No family/caregiver present to determine baseline                       PT - Cognition Comments: pt oriented to self, place, month as october Following commands: Impaired Following commands impaired: Follows one step commands with increased time    Cueing Cueing Techniques: Verbal cues, Gestural cues, Tactile cues  Exercises General Exercises - Lower Extremity Ankle Circles/Pumps: AAROM, Both, 5 reps, Seated, Limitations Ankle Circles/Pumps Limitations: Pain Long Arc Quad: AAROM, Both, 5 reps, Seated, Limitations Long Arc Quad Limitations: Pain Hip ABduction/ADduction: AAROM, 5 reps, Supine, Limitations Hip  Abduction/Adduction Limitations: Pain    General Comments General comments (skin integrity, edema, etc.):  (BP sitting EOB 135/72, HR 93, 94% on RA)      Pertinent Vitals/Pain Pain Assessment Pain Assessment: Faces Faces Pain Scale: Hurts whole lot Pain Location: feet Pain Descriptors / Indicators: Discomfort, Grimacing Pain Intervention(s): Patient requesting pain meds-RN notified, Limited activity within patient's tolerance    Home Living                           Prior Function            PT Goals (current goals can now be found in the care plan section) Acute Rehab PT Goals Patient Stated Goal: to have less pain    Frequency    Min 2X/week      PT Plan      Co-evaluation              AM-PAC PT 6 Clicks Mobility   Outcome Measure  Help needed turning from your back to your side while in a flat bed without using bedrails?: A Lot Help needed moving from lying on your back to sitting on the side of a flat bed without using bedrails?: A Lot Help needed moving to and from a bed to a chair (including a wheelchair)?: Total Help needed standing up from a chair using your arms (e.g., wheelchair or bedside chair)?: Total Help needed to walk in hospital room?: Total Help needed climbing 3-5 steps with a railing? : Total 6 Click Score: 8    End of Session Equipment Utilized During Treatment: Gait belt Activity Tolerance: Patient limited by fatigue;Patient limited by pain Patient left: in bed;with call bell/phone within reach;with bed alarm set Nurse Communication: Mobility status;Patient requests pain meds PT Visit Diagnosis: Other abnormalities of gait and mobility (R26.89);Difficulty in walking, not elsewhere classified (R26.2);Muscle weakness (generalized) (M62.81)     Time: 8977-8953 PT Time Calculation (min) (ACUTE ONLY): 24 min  Charges:    $Therapeutic Exercise: 8-22 mins $Therapeutic Activity: 8-22 mins PT General Charges $$ ACUTE PT VISIT: 1 Visit                    Darice Bohr, PTA  Darice JAYSON Bohr 11/05/2023, 1:10 PM

## 2023-11-05 NOTE — Consult Note (Signed)
 PHARMACY CONSULT NOTE - ELECTROLYTES  Pharmacy Consult for Electrolyte Monitoring and Replacement   Recent Labs: Height: 5' 8 (172.7 cm) Weight: 72.2 kg (159 lb 2.8 oz) IBW/kg (Calculated) : 63.9 Estimated Creatinine Clearance: 40.1 mL/min (A) (by C-G formula based on SCr of 1.45 mg/dL (H)). Potassium (mmol/L)  Date Value  11/05/2023 3.6  11/16/2013 4.0   Magnesium  (mg/dL)  Date Value  90/83/7974 1.9   Calcium  (mg/dL)  Date Value  90/83/7974 7.5 (L)   Calcium , Total (mg/dL)  Date Value  90/71/7984 7.9 (L)   Albumin  (g/dL)  Date Value  90/84/7974 1.9 (L)  07/06/2015 4.2  10/07/2012 2.9 (L)   Phosphorus (mg/dL)  Date Value  90/84/7974 2.5   Sodium (mmol/L)  Date Value  11/05/2023 135  07/06/2015 146 (H)  11/16/2013 131 (L)   Corrected Ca: 9.2 mg/dL  Assessment  Tricia [name] is a 63 y.o. female presenting with weakness and leg swelling. PMH significant for anxiety, osteoarthritis, asthma, COPD, depression, type 2 diabetes mellitus, GERD, dyslipidemia, and hypertension. Pharmacy has been consulted to monitor and replace electrolytes.  Diet: dysphagia 3 diet MIVF: N/A Pertinent medications: N/A  Goal of Therapy: Electrolytes WNL   Plan:  K 3.6 >> potassium chloride  packets 20mEq PO x 1  No other electrolyte replacement currently necessary Check BMP with tomorrow AM labs  Thank you for allowing pharmacy to be a part of this patient's care.  Will M. Lenon, PharmD, BCPS Clinical Pharmacist 11/05/2023 9:43 AM

## 2023-11-05 NOTE — NC FL2 (Signed)
 [place]  MEDICAID FL2 LEVEL OF CARE FORM     IDENTIFICATION  Patient Name: Tricia [name] Birthdate: 01-Mar-1960 Sex: female Admission Date (Current Location): 10/30/2023  [place] and [place] Number:  Chiropodist and Address:  [facility], [address], [place], [place] [address]      Provider Number: [id number]  Attending Physician Name and Address:  Lenon Marien LITTIE, MD  Relative Name and Phone Number:       Current Level of Care: Hospital Recommended Level of Care: Skilled Nursing Facility Prior Approval Number:    Date Approved/Denied:   PASRR Number:    Discharge Plan: SNF    Current Diagnoses: Patient Active Problem List   Diagnosis Date Noted   Leg swelling 11/01/2023   Sepsis (HCC) 10/31/2023   Sepsis due to cellulitis (HCC) 10/30/2023   Asthma, chronic 10/30/2023   AKI (acute kidney injury) (HCC) 10/30/2023   Hyperkalemia 10/30/2023   Hyponatremia 10/30/2023   Physical deconditioning 09/23/2023   Acute metabolic encephalopathy 09/23/2023   Pressure injury of skin 09/23/2023   Chronic respiratory failure with hypoxia (HCC) 09/22/2023   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/22/2023   SIRS (systemic inflammatory response syndrome) (HCC) 09/22/2023   Chronic prescription opiate use 09/22/2023   Frequent falls 09/22/2023   Protein-calorie malnutrition, severe 09/17/2023   Malnutrition of moderate degree 09/02/2023   Syncope, vasovagal 08/29/2023   Parkinson disease (HCC) 08/29/2023   Anxiety and depression 08/29/2023   Chronic obstructive pulmonary disease (COPD) (HCC) 08/29/2023   GERD without esophagitis 08/29/2023   Elevated CK 08/29/2023   Type 2 diabetes mellitus with stage 3 chronic kidney disease (HCC) 08/29/2023   Acute kidney injury superimposed on chronic kidney disease (HCC) 08/28/2023   Sacral wound 02/21/2022   Pressure injury of sacral region, stage 1 02/21/2022   CAP  (community acquired pneumonia) 09/08/2021   Nausea 09/08/2021   Hypoalbuminemia due to protein-calorie malnutrition (HCC) 09/08/2021   Lumbar herniated disc 07/11/2021   Calculus of gallbladder without cholecystitis without obstruction 07/11/2021   Senile purpura (HCC) 06/05/2021   Atherosclerosis of aorta (HCC) 06/05/2021   Parkinson's disease (HCC) 06/05/2021   Chronic kidney disease (CKD) stage G3a/A2, moderately decreased glomerular filtration rate (GFR) between 45-59 mL/min/1.73 square meter and albuminuria creatinine ratio between 30-299 mg/g (HCC) 06/05/2021   Esophageal dysphagia    Gastric erythema    Columnar-lined esophagus    Moderate malnutrition (HCC) 09/26/2020   Centrilobular emphysema (HCC) 03/30/2020   History of urinary retention 11/18/2019   GAD (generalized anxiety disorder) 10/13/2019   MDD (major depressive disorder), recurrent episode, moderate (HCC) 10/13/2019   At risk for long QT syndrome 10/13/2019   Nonrheumatic mitral valve regurgitation 05/04/2019   Benign neoplasm of descending colon    Polyp of sigmoid colon    History of Klebsiella UTI 09/11/2023 01/24/2015   Polyneuropathy 10/21/2014   Chronic venous insufficiency 10/05/2014   Bilateral leg edema 08/16/2014   Major depression in partial remission (HCC) 08/16/2014   Acid reflux 08/16/2014   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   Asthma, moderate persistent 08/16/2014   Carpal tunnel syndrome 08/16/2014   Cervical pain 08/16/2014   CAFL (chronic airflow limitation) (HCC) 08/16/2014   Type 2 diabetes mellitus with peripheral neuropathy (HCC) 08/16/2014   Diabetes mellitus type 2, insulin  dependent (HCC) 08/16/2014   Dyslipidemia 08/16/2014   Tobacco use disorder 08/16/2014   Benign neoplasm of stomach 08/16/2014   Gout 08/16/2014   Mixed hyperlipidemia 08/16/2014  Low back pain 08/16/2014   Lumbar radiculopathy 08/16/2014   Headache, migraine 08/16/2014   Arthralgia of multiple joints  08/16/2014   Vitamin D  deficiency 08/16/2014   Primary osteoarthritis of both knees 06/22/2014   Benign essential tremor 10/02/2013   Cervical dystonia 10/02/2013   Chronic diastolic heart failure (HCC) 11/16/2012   Chronic pain 11/13/2012    Orientation RESPIRATION BLADDER Height & Weight     Self, Time, Situation, Place  Normal Continent Weight: 159 lb 2.8 oz (72.2 kg) Height:  5' 8 (172.7 cm)  BEHAVIORAL SYMPTOMS/MOOD NEUROLOGICAL BOWEL NUTRITION STATUS      Incontinent Diet  AMBULATORY STATUS COMMUNICATION OF NEEDS Skin   Extensive Assist Verbally Surgical wounds (Pressure injury Buttocks, Sacrum Medial)                       Personal Care Assistance Level of Assistance  Bathing, Dressing, Feeding Bathing Assistance: Limited assistance Feeding assistance: Limited assistance Dressing Assistance: Limited assistance     Functional Limitations Info  Sight, Hearing, Speech Sight Info: Impaired Hearing Info: Adequate Speech Info: Adequate    SPECIAL CARE FACTORS FREQUENCY  PT (By licensed PT), OT (By licensed OT)     PT Frequency: 5x/week OT Frequency: 5x/week            Contractures      Additional Factors Info  Code Status, Allergies Code Status Info: Full Allergies Info: Augmentin (Amoxicillin-pot Clavulanate), Heparin , Penicillins           Current Medications (11/05/2023):  This is the current hospital active medication list Current Facility-Administered Medications  Medication Dose Route Frequency Provider Last Rate Last Admin   acetaminophen  (TYLENOL ) suppository 650 mg  650 mg Rectal Q4H PRN Rust-Chester, Britton L, NP       acetaminophen  (TYLENOL ) tablet 650 mg  650 mg Per Tube Q6H PRN Keene, Jeremiah D, NP   650 mg at 11/03/23 2027   budesonide  (PULMICORT ) nebulizer solution 0.5 mg  0.5 mg Nebulization BID Keene, Jeremiah D, NP   0.5 mg at 11/05/23 9273   carbidopa -levodopa  (SINEMET  CR) 50-200 MG per tablet controlled release 1 tablet  1  tablet Oral QHS Nada Adriana BIRCH, RPH   1 tablet at 11/04/23 2307   carbidopa -levodopa  (SINEMET  IR) 10-100 MG per tablet immediate release 1 tablet  1 tablet Oral TID WC Grubb, Rodney D, RPH   1 tablet at 11/05/23 1201   cefTRIAXone  (ROCEPHIN ) 2 g in sodium chloride  0.9 % 100 mL IVPB  2 g Intravenous Q24H Parris Manna, MD   Stopped at 11/04/23 1400   Chlorhexidine  Gluconate Cloth 2 % PADS 6 each  6 each Topical Daily Lenon Marien CROME, MD   6 each at 11/04/23 1000   dextrose  50 % solution 50 mL  1 ampule Intravenous PRN Aleskerov, Fuad, MD   50 mL at 11/02/23 0722   DULoxetine  (CYMBALTA ) DR capsule 60 mg  60 mg Oral Daily Grubb, Rodney D, RPH   60 mg at 11/05/23 1150   feeding supplement (ENSURE PLUS HIGH PROTEIN) liquid 237 mL  237 mL Oral BID BM Lenon Marien CROME, MD   237 mL at 11/05/23 0940   gabapentin  (NEURONTIN ) capsule 300 mg  300 mg Oral TID Lenon Marien CROME, MD       insulin  aspart (novoLOG ) injection 0-9 Units  0-9 Units Subcutaneous Q4H Lenon Marien CROME, MD   1 Units at 11/05/23 0934   ipratropium-albuterol  (DUONEB) 0.5-2.5 (3) MG/3ML nebulizer solution 3 mL  3 mL Inhalation Q6H PRN Mansy, Jan A, MD   3 mL at 11/05/23 0438   ipratropium-albuterol  (DUONEB) 0.5-2.5 (3) MG/3ML nebulizer solution 3 mL  3 mL Nebulization BID Parris Manna, MD   3 mL at 11/05/23 0726   leptospermum manuka honey (MEDIHONEY) paste 1 Application  1 Application Topical Daily Aleskerov, Fuad, MD   1 Application at 11/04/23 1000   lidocaine  (LIDODERM ) 5 % 1 patch  1 patch Transdermal Q24H Rust-Chester, Britton L, NP   1 patch at 11/04/23 2135   lipase/protease/amylase (CREON ) capsule 12,000 Units  12,000 Units Oral TID THEOPOLIS Lenon Marien LITTIE, MD   12,000 Units at 11/05/23 1201   midodrine  (PROAMATINE ) tablet 5 mg  5 mg Oral TID WC Lenon Marien LITTIE, MD       morphine  (PF) 2 MG/ML injection 2 mg  2 mg Intravenous Q4H PRN Kathrene Almarie Bake, NP   2 mg at 11/05/23 0418   multivitamin with minerals  tablet 1 tablet  1 tablet Oral Daily Lenon Marien LITTIE, MD   1 tablet at 11/05/23 1150   ondansetron  (ZOFRAN ) injection 4 mg  4 mg Intravenous Q6H PRN Mansy, Jan A, MD   4 mg at 11/05/23 1148   Oral care mouth rinse  15 mL Mouth Rinse PRN Aleskerov, Fuad, MD       oxyCODONE  (Oxy IR/ROXICODONE ) immediate release tablet 5 mg  5 mg Oral Q6H PRN Nelson, Dana G, NP   5 mg at 11/05/23 1150   QUEtiapine  (SEROQUEL ) tablet 25 mg  25 mg Oral QHS Kathrene Almarie Bake, NP   25 mg at 11/04/23 2125   theophylline  (UNIPHYL) 400 MG 24 hr tablet 400 mg  400 mg Oral Daily Lenon Marien L, MD   400 mg at 11/05/23 1150   [START ON 11/06/2023] thiamine  (VITAMIN B1) tablet 100 mg  100 mg Oral Daily Lenon Marien LITTIE, MD         Discharge Medications: Please see discharge summary for a list of discharge medications.  Relevant Imaging Results:  Relevant Lab Results:   Additional Information SSN: [id number]  Alvaro Louder, LCSW

## 2023-11-05 NOTE — Progress Notes (Signed)
 [facility]  ROUNDING NOTE   Subjective:   Patient seen laying in bed  Alert and oriented Partially completed breakfast tray at bedside  NG tube is in place, aspirate noted Foley catheter Serum creatinine 1.45 Tube feeds @ 75ml/hr  Objective:  Vital signs in last 24 hours:  Temp:  [98.1 F (36.7 C)-99 F (37.2 C)] 98.5 F (36.9 C) (09/16 0743) Pulse Rate:  [91-105] 94 (09/16 0743) Resp:  [16-23] 16 (09/16 0743) BP: (120-145)/(60-84) 134/64 (09/16 0743) SpO2:  [89 %-100 %] 100 % (09/16 0743) Weight:  [72.2 kg] 72.2 kg (09/16 0352)  Weight change: 1 kg Filed Weights   11/03/23 0500 11/04/23 0500 11/05/23 0352  Weight: 69.2 kg 71.2 kg 72.2 kg    Intake/Output: I/O last 3 completed shifts: In: 686.6 [P.O.:50; Other:10; NG/GT:254.6; IV Piggyback:372] Out: 1800 [Urine:1250; Emesis/NG output:550]   Intake/Output this shift:  Total I/O In: 341.7 [NG/GT:341.7] Out: -   Physical Exam: General: NAD  Head: Normocephalic, atraumatic.  NG tube in place  Eyes: Anicteric  Lungs:  Clear to auscultation  Heart: Regular rate and rhythm  Abdomen:  Soft, nontender  Extremities:  + peripheral edema.  Neurologic: Awake, alert, conversant  Skin: BLE erythema   GU Foley catheter    Basic Metabolic Panel: Recent Labs  Lab 11/01/23 0352 11/02/23 0400 11/03/23 0320 11/03/23 1459 11/04/23 0433 11/05/23 0841  NA 131* 132* 136 134* 137 135  K 4.5 3.6 3.0* 3.4* 3.3* 3.6  CL 102 100 96* 93* 96* 93*  CO2 18* 24 30 32 33* 30  GLUCOSE 217* 131* 101* 168* 81 127*  BUN 51* 48* 41* 38* 33* 27*  CREATININE 3.51* 2.97* 2.15* 1.94* 1.63* 1.45*  CALCIUM  7.2* 6.9* 6.6* 7.0* 7.3* 7.5*  MG 1.6* 2.0 1.8 1.7 1.7 1.9  PHOS 5.3* 3.9 2.6  --  2.5  --     Liver Function Tests: Recent Labs  Lab 10/30/23 1928 11/01/23 0352 11/02/23 0400 11/03/23 0320 11/04/23 0433  AST 25  --   --   --   --   ALT 9  --   --   --   --   ALKPHOS 123  --   --   --   --   BILITOT 1.2  --   --    --   --   PROT 5.7*  --   --   --   --   ALBUMIN  2.0* <1.5* 1.5* <1.5* 1.9*   No results for input(s): LIPASE, AMYLASE in the last 168 hours. No results for input(s): AMMONIA in the last 168 hours.  CBC: Recent Labs  Lab 10/30/23 1928 10/31/23 0610 11/02/23 0400 11/02/23 1215 11/03/23 0320 11/04/23 0433 11/05/23 0841  WBC 10.1   < > 4.4 4.4 5.4 5.7 8.8  NEUTROABS 8.9*  --   --  3.7  --   --   --   HGB 11.8*   < > 7.2* 8.0* 8.1* 7.3* 8.0*  HCT 36.9   < > 21.2* 23.1* 23.0* 21.4* 23.8*  MCV 97.4   < > 92.2 91.7 91.3 92.6 94.8  PLT 367   < > 67* 61* 57* 70* 129*   < > = values in this interval not displayed.    Cardiac Enzymes: Recent Labs  Lab 10/30/23 2213  CKTOTAL 38    BNP: Invalid input(s): POCBNP  CBG: Recent Labs  Lab 11/04/23 1630 11/04/23 2013 11/04/23 2353 11/05/23 0350 11/05/23 0736  GLUCAP 144* 142* 134* 158*  136*    Microbiology: Results for orders placed or performed during the hospital encounter of 10/30/23  Blood culture (routine x 2)     Status: Abnormal   Collection Time: 10/30/23  7:28 PM   Specimen: BLOOD  Result Value Ref Range Status   Specimen Description   Final    BLOOD LEFT ANTECUBITAL Performed at [facility], [address], [place], [place] [address]    Special Requests   Final    BLOOD Blood Culture adequate volume Performed at [facility], [address] [place], [place] [address]    Culture  Setup Time   Final    GRAM NEGATIVE RODS IN BOTH AEROBIC AND ANAEROBIC BOTTLES CRITICAL RESULT CALLED TO, READ BACK BY AND VERIFIED WITHBETHA ESTILL LUTES PHARMD [id number] 10/31/23 HNM GRAM STAIN REVIEWED-AGREE WITH RESULT DRT Performed at [facility], [address], [place], [place] [address]    Culture SERRATIA MARCESCENS (A)  Final   Report Status 11/02/2023 FINAL  Final   Organism ID, Bacteria SERRATIA MARCESCENS  Final      Susceptibility   Serratia marcescens - MIC*    CEFEPIME  <=0.12  SENSITIVE Sensitive     ERTAPENEM <=0.12 SENSITIVE Sensitive     CEFTRIAXONE  <=0.25 SENSITIVE Sensitive     CIPROFLOXACIN  <=0.06 SENSITIVE Sensitive     GENTAMICIN <=1 SENSITIVE Sensitive     MEROPENEM  <=0.25 SENSITIVE Sensitive     TRIMETH /SULFA  <=20 SENSITIVE Sensitive     * SERRATIA MARCESCENS  Blood Culture ID Panel (Reflexed)     Status: Abnormal   Collection Time: 10/30/23  7:28 PM  Result Value Ref Range Status   Enterococcus faecalis NOT DETECTED NOT DETECTED Final   Enterococcus Faecium NOT DETECTED NOT DETECTED Final   Listeria monocytogenes NOT DETECTED NOT DETECTED Final   Staphylococcus species NOT DETECTED NOT DETECTED Final   Staphylococcus aureus (BCID) NOT DETECTED NOT DETECTED Final   Staphylococcus epidermidis NOT DETECTED NOT DETECTED Final   Staphylococcus lugdunensis NOT DETECTED NOT DETECTED Final   Streptococcus species NOT DETECTED NOT DETECTED Final   Streptococcus agalactiae NOT DETECTED NOT DETECTED Final   Streptococcus pneumoniae NOT DETECTED NOT DETECTED Final   Streptococcus pyogenes NOT DETECTED NOT DETECTED Final   A.calcoaceticus-baumannii NOT DETECTED NOT DETECTED Final   Bacteroides fragilis NOT DETECTED NOT DETECTED Final   Enterobacterales DETECTED (A) NOT DETECTED Final    Comment: Enterobacterales represent a large order of gram negative bacteria, not a single organism. CRITICAL RESULT CALLED TO, READ BACK BY AND VERIFIED WITH: SHEEMA HALLAJI PHARMD [id number] 10/31/23 HNM    Enterobacter cloacae complex NOT DETECTED NOT DETECTED Final   Escherichia coli NOT DETECTED NOT DETECTED Final   Klebsiella aerogenes NOT DETECTED NOT DETECTED Final   Klebsiella oxytoca NOT DETECTED NOT DETECTED Final   Klebsiella pneumoniae NOT DETECTED NOT DETECTED Final   Proteus species NOT DETECTED NOT DETECTED Final   Salmonella species NOT DETECTED NOT DETECTED Final   Serratia marcescens DETECTED (A) NOT DETECTED Final    Comment: CRITICAL RESULT CALLED TO, READ  BACK BY AND VERIFIED WITH: ESTILL LUTES PHARMD [id number] 10/31/23 HNM    Haemophilus influenzae NOT DETECTED NOT DETECTED Final   Neisseria meningitidis NOT DETECTED NOT DETECTED Final   Pseudomonas aeruginosa NOT DETECTED NOT DETECTED Final   Stenotrophomonas maltophilia NOT DETECTED NOT DETECTED Final   Candida albicans NOT DETECTED NOT DETECTED Final   Candida auris NOT DETECTED NOT DETECTED Final   Candida glabrata NOT DETECTED NOT DETECTED  Final   Candida krusei NOT DETECTED NOT DETECTED Final   Candida parapsilosis NOT DETECTED NOT DETECTED Final   Candida tropicalis NOT DETECTED NOT DETECTED Final   Cryptococcus neoformans/gattii NOT DETECTED NOT DETECTED Final   CTX-M ESBL NOT DETECTED NOT DETECTED Final   Carbapenem resistance IMP NOT DETECTED NOT DETECTED Final   Carbapenem resistance KPC NOT DETECTED NOT DETECTED Final   Carbapenem resistance NDM NOT DETECTED NOT DETECTED Final   Carbapenem resist OXA 48 LIKE NOT DETECTED NOT DETECTED Final   Carbapenem resistance VIM NOT DETECTED NOT DETECTED Final    Comment: Performed at [facility], [address] [place], [place] [address]  Blood culture (routine x 2)     Status: Abnormal   Collection Time: 10/30/23  7:55 PM   Specimen: BLOOD  Result Value Ref Range Status   Specimen Description   Final    BLOOD BLOOD RIGHT ARM Performed at [facility], [address], [place], [place] [address]    Special Requests   Final    BOTTLES DRAWN AEROBIC AND ANAEROBIC Blood Culture results may not be optimal due to an inadequate volume of blood received in culture bottles Performed at [facility], [address] [place], [place] [address]    Culture  Setup Time   Final    GRAM NEGATIVE RODS IN BOTH AEROBIC AND ANAEROBIC BOTTLES CRITICAL VALUE NOTED.  VALUE IS CONSISTENT WITH PREVIOUSLY REPORTED AND CALLED VALUE. GRAM STAIN REVIEWED-AGREE WITH RESULT DRT    Culture (A)  Final    SERRATIA  MARCESCENS SUSCEPTIBILITIES PERFORMED ON PREVIOUS CULTURE WITHIN THE LAST 5 DAYS. Performed at [facility], [address] [place], [place] [address]    Report Status 11/02/2023 FINAL  Final  Resp panel by RT-PCR (RSV, Flu A&B, Covid) Anterior Nasal Swab     Status: None   Collection Time: 10/30/23  8:01 PM   Specimen: Anterior Nasal Swab  Result Value Ref Range Status   SARS Coronavirus 2 by RT PCR NEGATIVE NEGATIVE Final    Comment: (NOTE) SARS-CoV-2 target nucleic acids are NOT DETECTED.  The SARS-CoV-2 RNA is generally detectable in upper respiratory specimens during the acute phase of infection. The lowest concentration of SARS-CoV-2 viral copies this assay can detect is 138 copies/mL. A negative result does not preclude SARS-Cov-2 infection and should not be used as the sole basis for treatment or other patient management decisions. A negative result may occur with  improper specimen collection/handling, submission of specimen other than nasopharyngeal swab, presence of viral mutation(s) within the areas targeted by this assay, and inadequate number of viral copies(<138 copies/mL). A negative result must be combined with clinical observations, patient history, and epidemiological information. The expected result is Negative.  Fact Sheet for Patients:  BloggerCourse.com  Fact Sheet for Healthcare Providers:  SeriousBroker.it  This test is no t yet approved or cleared by the [place]  FDA and  has been authorized for detection and/or diagnosis of SARS-CoV-2 by FDA under an Emergency Use Authorization (EUA). This EUA will remain  in effect (meaning this test can be used) for the duration of the COVID-19 declaration under Section 564(b)(1) of the Act, 21 U.S.C.section 360bbb-3(b)(1), unless the authorization is terminated  or revoked sooner.       Influenza A by PCR NEGATIVE NEGATIVE Final   Influenza B by PCR  NEGATIVE NEGATIVE Final    Comment: (NOTE) The Xpert Xpress SARS-CoV-2/FLU/RSV plus assay is intended as an aid in the  diagnosis of influenza from Nasopharyngeal swab specimens and should not be used as a sole basis for treatment. Nasal washings and aspirates are unacceptable for Xpert Xpress SARS-CoV-2/FLU/RSV testing.  Fact Sheet for Patients: BloggerCourse.com  Fact Sheet for Healthcare Providers: SeriousBroker.it  This test is not yet approved or cleared by the [place]  FDA and has been authorized for detection and/or diagnosis of SARS-CoV-2 by FDA under an Emergency Use Authorization (EUA). This EUA will remain in effect (meaning this test can be used) for the duration of the COVID-19 declaration under Section 564(b)(1) of the Act, 21 U.S.C. section 360bbb-3(b)(1), unless the authorization is terminated or revoked.     Resp Syncytial Virus by PCR NEGATIVE NEGATIVE Final    Comment: (NOTE) Fact Sheet for Patients: BloggerCourse.com  Fact Sheet for Healthcare Providers: SeriousBroker.it  This test is not yet approved or cleared by the [place]  FDA and has been authorized for detection and/or diagnosis of SARS-CoV-2 by FDA under an Emergency Use Authorization (EUA). This EUA will remain in effect (meaning this test can be used) for the duration of the COVID-19 declaration under Section 564(b)(1) of the Act, 21 U.S.C. section 360bbb-3(b)(1), unless the authorization is terminated or revoked.  Performed at [facility], [address] [place], [place] [address]   MRSA Next Gen by PCR, Nasal     Status: None   Collection Time: 10/31/23  2:05 PM   Specimen: Nasal Mucosa; Nasal Swab  Result Value Ref Range Status   MRSA by PCR Next Gen NOT DETECTED NOT DETECTED Final    Comment: (NOTE) The GeneXpert MRSA Assay (FDA approved for NASAL specimens only), is  one component of a comprehensive MRSA colonization surveillance program. It is not intended to diagnose MRSA infection nor to guide or monitor treatment for MRSA infections. Test performance is not FDA approved in patients less than 41 years old. Performed at [facility], [address] [place], [place] [address]     Coagulation Studies: No results for input(s): LABPROT, INR in the last 72 hours.   Urinalysis: No results for input(s): COLORURINE, LABSPEC, PHURINE, GLUCOSEU, HGBUR, BILIRUBINUR, KETONESUR, PROTEINUR, UROBILINOGEN, NITRITE, LEUKOCYTESUR in the last 72 hours.  Invalid input(s): APPERANCEUR     Imaging: No results found.    Medications:    cefTRIAXone  (ROCEPHIN )  IV Stopped (11/04/23 1400)    budesonide  (PULMICORT ) nebulizer solution  0.5 mg Nebulization BID   carbidopa -levodopa   1 tablet Oral QHS   carbidopa -levodopa   1 tablet Oral TID WC   Chlorhexidine  Gluconate Cloth  6 each Topical Daily   DULoxetine   60 mg Oral Daily   feeding supplement  237 mL Oral BID BM   insulin  aspart  0-9 Units Subcutaneous Q4H   ipratropium-albuterol   3 mL Nebulization BID   leptospermum manuka honey  1 Application Topical Daily   lidocaine   1 patch Transdermal Q24H   lipase/protease/amylase  12,000 Units Oral TID AC   midodrine   10 mg Oral TID WC   multivitamin with minerals  1 tablet Oral Daily   potassium chloride   20 mEq Oral Once   QUEtiapine   25 mg Oral QHS   theophylline   400 mg Oral Daily   thiamine   100 mg Per Tube Daily   acetaminophen , acetaminophen , [DISCONTINUED] insulin  aspart **AND** dextrose  **AND** POCT CBG monitoring, ipratropium-albuterol , morphine  injection, [DISCONTINUED] ondansetron  **OR** ondansetron  (ZOFRAN ) IV, mouth rinse, oxyCODONE   Assessment/ Plan:  [name] is a 63 y.o.  female with past medical history including COPD,  type 2 diabetes, anxiety, depression, GERD and hypertension, who was  admitted to [facility] on 10/30/2023 for Hyperkalemia [E87.5] Hyponatremia [E87.1] Weakness [R53.1] Leg swelling [M79.89] Wound infection [T14.8XXA, L08.9] AKI (acute kidney injury) (HCC) [N17.9] Sepsis (HCC) [A41.9] Sepsis due to cellulitis (HCC) [L03.90, A41.9] Sepsis, due to unspecified organism, unspecified whether acute organ dysfunction present (HCC) [A41.9]   Acute kidney injury likely secondary to multiple factors: infectious process (gram-negative sepsis), hypotension and volume depletion. Normal renal function noted last month. Found down by son. CK 58. Renal US -nonobstructive left kidney stone.  Renal function continues to improve, 1.45 today. Remains on midodrine  for BP support.   Lab Results  Component Value Date   CREATININE 1.45 (H) 11/05/2023   CREATININE 1.63 (H) 11/04/2023   CREATININE 1.94 (H) 11/03/2023    Intake/Output Summary (Last 24 hours) at 11/05/2023 1149 Last data filed at 11/05/2023 0740 Gross per 24 hour  Intake 840.37 ml  Output 200 ml  Net 640.37 ml   2. Acute metabolic acidosis, S bicarb 13->24->32->33 > 30 - Sodium bicarbonate  corrected and bicarb drip stopped  3. Anemia in chronic kidney disease Lab Results  Component Value Date   HGB 8.0 (L) 11/05/2023    Hemoglobin low.  Monitor for bleeding.  4. Sepsis secondary to cellulitis, bilateral lower extremities.  Gram-negative sepsis.   Antibiotics as per primary team, ceftriaxone .  5.  Hypokalemia Levels remain decreased, 3.6, corrected with IV supplementation.    LOS: 6 Slayden Mennenga 9/16/202511:49 AM

## 2023-11-05 NOTE — Progress Notes (Signed)
 PROGRESS NOTE Tricia [name]    DOB: 18-May-1960, 63 y.o.  FMW:[id number]    Code Status: Full Code   DOA: 10/30/2023   LOS: 6  Brief hospital course  Tricia [name] is a 63 y.o. female with a PMH significant for anxiety, osteoarthritis, asthma, COPD, depression, type 2 diabetes mellitus, GERD, dyslipidemia, and HTN, who presented to the emergency room with acute onset of generalized weakness and bilateral lower extremity swelling after being found down after several hours.   ED Course: BP was 109/53 with heart rate of 114 and later BP was 96/52 and temperature was 99.8, respiratory rate was 22. Labs reveal hyponatremia 128 and hyperkalemia of 6 with a CO2 of 13 and glucose of 126, BUN of 42 and creatinine 3.64 calcium  of 7.8 and albumin  2 with total protein 5.7.  BNP was 78.5 and high-sensitivity troponin I was 14.  Lactic acid was 2.6 and CBC showed hemoglobin 11.8 and hematocrit 36.9.  Respiratory panel came back negative.  Blood cultures were drawn. EKG: sinus tachycardia with rate 118 with poor R wave progression. Imaging: Portable chest x-ray showed emphysema with no acute cardiopulmonary disease. The patient was given IV vancomycin  and  IV cefepime .  On initial evaluation- patient was lethargic and minimally responsive. BP low and not responding to IV fluids or increase in midodrine . CCM was consulted who took over care for patient who required vasopressors. Weaned from them 9/14. NG tube was placed for nutrition as she was unable to tolerate PO while incapacitated.  9/15: transferred back to [facility]  11/05/23 -patient BP stable, will mean midodrine . PO intake is low but consistent, will dc NG tube and add PO supplements. Renal function improved- remove tunneled cath as she is not likely to need HD. Respiratory status- improved back to baseline O2 use. Mobility is greatly reduced- she likely needs SNF but needs to be reevaluated by PT/   Assessment & Plan  Principal Problem:    Sepsis due to cellulitis [facility]) Active Problems:   AKI (acute kidney injury) (HCC)   Hyperkalemia   Asthma, chronic   Hyponatremia   Parkinson's disease (HCC)   GERD without esophagitis   Dyslipidemia   Gout   Sepsis (HCC)   Leg swelling  Septic shock- shock has resolved. Weaned off pressors 9/14. Continue home midodrine  increased to 10mg --> will decrease to 5mg  today Sepsis due to cellulitis of lower extremities(HCC)- manifested by tachycardia and hypotension, elevated LA, renal failure. See clinical image for further detail.  Serratia bacteremia- BCID positive.  - Continue antibiotic therapy with IV cefepime  transitioned to CTX - Wound care consult - PT/OT and dispo planning. Patient states she will not go to SNF - removed foley  Severe calorie malnutrition NGT removed today. Patient tolerating PO well.  - added supplements - discontinuing albumin  supplementation  - slp following. Continue dys 3 diet  Chronic hypoxia- on 2L [place] chronically at home. Remains on 2L currently  - continue breathing treatments  Depression- continue home duloxetine , seroquel     AKI  Acute renal failure- Cr significantly increased from baseline normal to 3.64 on presentation. Greatly improved. Cr now 1.45. did not require HD - Will follow BMP. - Will avoid nephrotoxins. - nephrology has been consulted, appreciate your care  - OK to remove temp cath at this time.   Diabetes- diet controlled. A1c 5.1.  - on sliding scale currently in setting of enteral feeds. Can likely be discontinued when removed. Reassess in the morning based on glucose today  with PO intake only   Hyponatremia  Hyperkalemia- resolved, now slightly hypokalemia. Addressed with nutritional supplements and pharmacy consulted for management  - It is likely secondary to AKI. - follow BMP   Asthma, chronic - continue theophylline , Singulair , and bronchodilator inhaler.   GERD without esophagitis -continue PPI therapy.    Parkinson's disease (HCC) - continue Sinemet  CR and Sinemet  IR.   Dyslipidemia- not on statin   Gout- not acutely exacerbated  Peripheral neuralgia - continue gabapentin   Body mass index is 24.2 kg/m.  VTE ppx:   Diet:     Diet   DIET DYS 3 Room service appropriate? Yes with Assist; Fluid consistency: Thin   Consultants: [facility]CM Nephrology   Subjective 11/05/23    Pt reports feeling great about discontinuing a lot of the lines she had. Worked with PT but unable to really bear weight due to foot pain. She states she doesn't want to go to SNF because they told her she could smoke there in remote past and when she arrived, they wouldn't let her smoke.    Objective  Blood pressure (!) 95/50, pulse (!) 109, temperature 99.5 F (37.5 C), temperature source Axillary, resp. rate 14, height 5' 8 (1.727 m), weight 70.3 kg, SpO2 100%.  Intake/Output Summary (Last 24 hours) at 11/05/2023 0723 Last data filed at 11/05/2023 0410 Gross per 24 hour  Intake 637.19 ml  Output 950 ml  Net -312.81 ml   Filed Weights   11/03/23 0500 11/04/23 0500 11/05/23 0352  Weight: 69.2 kg 71.2 kg 72.2 kg    Physical Exam:  General: alert, resting in bed. NAD Respiratory: normal respiratory effort. CTAB Cardiovascular: normal S1/S2, RRR, no JVD, murmurs Gastrointestinal: soft, NT, ND Nervous: Alert and oriented no focal abnormalities  Extremities: trace edema to bilateral lower extremities. Erythema much improved. No drainage  Labs   I have personally reviewed the following labs and imaging studies CBC    Component Value Date/Time   WBC 5.7 11/04/2023 0433   RBC 2.31 (L) 11/04/2023 0433   HGB 7.3 (L) 11/04/2023 0433   HGB 13.7 11/16/2013 2040   HCT 21.4 (L) 11/04/2023 0433   HCT 40.9 11/16/2013 2040   PLT 70 (L) 11/04/2023 0433   PLT 254 11/16/2013 2040   MCV 92.6 11/04/2023 0433   MCV 94 11/16/2013 2040   MCH 31.6 11/04/2023 0433   MCHC 34.1 11/04/2023 0433   RDW 14.6 11/04/2023 0433    RDW 14.0 11/16/2013 2040   LYMPHSABS 0.4 (L) 11/02/2023 1215   LYMPHSABS 2.7 11/16/2013 2040   MONOABS 0.2 11/02/2023 1215   MONOABS 0.6 11/16/2013 2040   EOSABS 0.0 11/02/2023 1215   EOSABS 0.1 11/16/2013 2040   BASOSABS 0.0 11/02/2023 1215   BASOSABS 0.1 11/16/2013 2040   BASOSABS 0 11/15/2011 1430      Latest Ref Rng & Units 11/04/2023    4:33 AM 11/03/2023    2:59 PM 11/03/2023    3:20 AM  BMP  Glucose 70 - 99 mg/dL 81  831  898   BUN 8 - 23 mg/dL 33  38  41   Creatinine 0.44 - 1.00 mg/dL 8.36  8.05  7.84   Sodium 135 - 145 mmol/L 137  134  136   Potassium 3.5 - 5.1 mmol/L 3.3  3.4  3.0   Chloride 98 - 111 mmol/L 96  93  96   CO2 22 - 32 mmol/L 33  32  30   Calcium  8.9 - 10.3 mg/dL 7.3  7.0  6.6    No results found.  Disposition Plan & Communication  Patient status: Inpatient  Admitted From: Home Planned disposition location: TBD Anticipated discharge date: TBD pending clinical development   Family Communication: none at bedside    Author: Marien LITTIE Piety, DO [facility] 11/05/2023, 7:23 AM   Available by Epic secure chat 7AM-7PM. If 7PM-7AM, please contact night-coverage.  [facility] contact information found on [email or web address].

## 2023-11-06 DIAGNOSIS — A419 Sepsis, unspecified organism: Secondary | ICD-10-CM | POA: Diagnosis not present

## 2023-11-06 DIAGNOSIS — L0390 Cellulitis, unspecified: Secondary | ICD-10-CM | POA: Diagnosis not present

## 2023-11-06 LAB — BASIC METABOLIC PANEL WITH GFR
Anion gap: 9 (ref 5–15)
BUN: 24 mg/dL — ABNORMAL HIGH (ref 8–23)
CO2: 31 mmol/L (ref 22–32)
Calcium: 7.7 mg/dL — ABNORMAL LOW (ref 8.9–10.3)
Chloride: 97 mmol/L — ABNORMAL LOW (ref 98–111)
Creatinine, Ser: 1.41 mg/dL — ABNORMAL HIGH (ref 0.44–1.00)
GFR, Estimated: 42 mL/min — ABNORMAL LOW (ref >60)
Glucose, Bld: 85 mg/dL (ref 70–99)
Potassium: 4.1 mmol/L (ref 3.5–5.1)
Sodium: 137 mmol/L (ref 135–145)

## 2023-11-06 LAB — GLUCOSE, CAPILLARY
Glucose-Capillary: 105 mg/dL — ABNORMAL HIGH (ref 70–99)
Glucose-Capillary: 126 mg/dL — ABNORMAL HIGH (ref 70–99)
Glucose-Capillary: 82 mg/dL (ref 70–99)
Glucose-Capillary: 82 mg/dL (ref 70–99)
Glucose-Capillary: 92 mg/dL (ref 70–99)
Glucose-Capillary: 93 mg/dL (ref 70–99)

## 2023-11-06 MED ORDER — MIDODRINE HCL 5 MG PO TABS
2.5000 mg | ORAL_TABLET | Freq: Three times a day (TID) | ORAL | Status: DC
Start: 2023-11-06 — End: 2023-11-09
  Administered 2023-11-08 – 2023-11-09 (×6): 2.5 mg via ORAL
  Filled 2023-11-06 (×9): qty 1

## 2023-11-06 MED ORDER — GABAPENTIN 300 MG PO CAPS
300.0000 mg | ORAL_CAPSULE | Freq: Two times a day (BID) | ORAL | Status: DC
  Administered 2023-11-06 – 2023-11-11 (×11): 300 mg via ORAL
  Filled 2023-11-06 (×11): qty 1

## 2023-11-06 MED ORDER — GABAPENTIN 300 MG PO CAPS
300.0000 mg | ORAL_CAPSULE | Freq: Every day | ORAL | Status: DC
Start: 2023-11-06 — End: 2023-11-06

## 2023-11-06 MED ORDER — GABAPENTIN 300 MG PO CAPS
600.0000 mg | ORAL_CAPSULE | Freq: Every day | ORAL | Status: DC
  Administered 2023-11-06 – 2023-11-10 (×5): 600 mg via ORAL
  Filled 2023-11-06 (×5): qty 2

## 2023-11-06 NOTE — Progress Notes (Signed)
 PROGRESS NOTE Tricia [name]    DOB: [birth year], 63 y.o.  FMW:[id number]    Code Status: Full Code   DOA: 10/30/2023   LOS: 7  Brief hospital course  Tricia [name] is a 63 y.o. female with a PMH significant for anxiety, osteoarthritis, asthma, COPD, depression, type 2 diabetes mellitus, GERD, dyslipidemia, and HTN, who presented to the emergency room with acute onset of generalized weakness and bilateral lower extremity swelling after being found down after several hours.   ED Course: BP was 109/53 with heart rate of 114 and later BP was 96/52 and temperature was 99.8, respiratory rate was 22. Labs reveal hyponatremia 128 and hyperkalemia of 6 with a CO2 of 13 and glucose of 126, BUN of 42 and creatinine 3.64 calcium  of 7.8 and albumin  2 with total protein 5.7.  BNP was 78.5 and high-sensitivity troponin I was 14.  Lactic acid was 2.6 and CBC showed hemoglobin 11.8 and hematocrit 36.9.  Respiratory panel came back negative.  Blood cultures were drawn. EKG: sinus tachycardia with rate 118 with poor R wave progression. Imaging: Portable chest x-ray showed emphysema with no acute cardiopulmonary disease. The patient was given IV vancomycin  and  IV cefepime .  On initial evaluation- patient was lethargic and minimally responsive. BP low and not responding to IV fluids or increase in midodrine . CCM was consulted who took over care for patient who required vasopressors. Weaned from them 9/14. NG tube was placed for nutrition as she was unable to tolerate PO while incapacitated.  9/15: transferred back to [facility]  11/06/23 -patient BP stable, will mean midodrine . PO intake is low but consistent, will dc NG tube and add PO supplements. Renal function improved- remove tunneled cath as she is not likely to need HD. Respiratory status- improved back to baseline O2 use. Mobility is greatly reduced- she likely needs SNF but needs to be reevaluated by PT/   Assessment & Plan  Principal Problem:    Sepsis due to cellulitis [facility]) Active Problems:   AKI (acute kidney injury) (HCC)   Hyperkalemia   Asthma, chronic   Hyponatremia   Parkinson's disease (HCC)   GERD without esophagitis   Dyslipidemia   Gout   Sepsis (HCC)   Leg swelling  Septic shockdue to cellulitis of lower extremities- Resolved Serratia bacteremia manifested by tachycardia and hypotension, elevated LA, renal failure.Weaned off pressors 9/14.  On midodrine  at home 5 mg 3 times daily.  Will continue to wean as able - Continue antibiotic therapy with IV ceftriaxone , will transition to PO in the AM  - Wound care consult - PT/OT and dispo planning. Patient amenable to SNF, as her son-in-law is unable to adequately care for  Severe calorie malnutrition  Patient tolerating PO well.  - Continue meal supplements -Have speech re-eval patient  Chronic hypoxic respiratory failure- on 2L [place] chronically at home. Remains on 2L currently  - continue nebs   Depression- continue home duloxetine , seroquel     AKI  Acute renal failure- Cr significantly increased from baseline normal to 3.64 on presentation. unclear etiology Likely due to hemodynamics.  Stable this AM.  Continue to avoid nephrotoxic agents   Diabetes- diet controlled. A1c 5.1.     Hyponatremia  Hyperkalemia- resolved   Asthma, chronic - Not in exacerbation. continue theophylline , Singulair , and bronchodilator inhaler.   GERD without esophagitis -continue PPI therapy.   Parkinson's disease (HCC) - continue Sinemet  CR and Sinemet  IR.   Dyslipidemia- not on statin   Gout- not acutely  exacerbated  Peripheral neuralgia - continue gabapentin   Body mass index is 25.88 kg/m.  VTE ppx:   Diet:     Diet   DIET DYS 3 Room service appropriate? Yes with Assist; Fluid consistency: Thin   Consultants: CCM Nephrology   Subjective 11/06/23    No acute issues overnight.   Objective  Blood pressure (!) 95/50, pulse (!) 109, temperature 99.5 F  (37.5 C), temperature source Axillary, resp. rate 14, height 5' 8 (1.727 m), weight 70.3 kg, SpO2 100%.  Intake/Output Summary (Last 24 hours) at 11/06/2023 1824 Last data filed at 11/06/2023 1300 Gross per 24 hour  Intake 0 ml  Output --  Net 0 ml   Filed Weights   11/04/23 0500 11/05/23 0352 11/06/23 0500  Weight: 71.2 kg 72.2 kg 77.2 kg    Physical Exam:  General: alert, resting in bed. NAD Respiratory: normal respiratory effort. CTAB Cardiovascular: normal S1/S2, RRR, no JVD, murmurs Gastrointestinal: soft, NT, ND Nervous: Alert and oriented no focal abnormalities  Extremities: trace edema to bilateral lower extremities. Erythema much improved. No drainage  Labs   I have personally reviewed the following labs and imaging studies CBC    Component Value Date/Time   WBC 8.8 11/05/2023 0841   RBC 2.51 (L) 11/05/2023 0841   HGB 8.0 (L) 11/05/2023 0841   HGB 13.7 11/16/2013 2040   HCT 23.8 (L) 11/05/2023 0841   HCT 40.9 11/16/2013 2040   PLT 129 (L) 11/05/2023 0841   PLT 254 11/16/2013 2040   MCV 94.8 11/05/2023 0841   MCV 94 11/16/2013 2040   MCH 31.9 11/05/2023 0841   MCHC 33.6 11/05/2023 0841   RDW 14.6 11/05/2023 0841   RDW 14.0 11/16/2013 2040   LYMPHSABS 0.4 (L) 11/02/2023 1215   LYMPHSABS 2.7 11/16/2013 2040   MONOABS 0.2 11/02/2023 1215   MONOABS 0.6 11/16/2013 2040   EOSABS 0.0 11/02/2023 1215   EOSABS 0.1 11/16/2013 2040   BASOSABS 0.0 11/02/2023 1215   BASOSABS 0.1 11/16/2013 2040   BASOSABS 0 11/15/2011 1430      Latest Ref Rng & Units 11/06/2023    4:12 AM 11/05/2023    8:41 AM 11/04/2023    4:33 AM  BMP  Glucose 70 - 99 mg/dL 85  872  81   BUN 8 - 23 mg/dL 24  27  33   Creatinine 0.44 - 1.00 mg/dL 8.58  8.54  8.36   Sodium 135 - 145 mmol/L 137  135  137   Potassium 3.5 - 5.1 mmol/L 4.1  3.6  3.3   Chloride 98 - 111 mmol/L 97  93  96   CO2 22 - 32 mmol/L 31  30  33   Calcium  8.9 - 10.3 mg/dL 7.7  7.5  7.3    No results found.  Disposition  Plan & Communication  Patient status: Inpatient  Admitted From: Home Planned disposition location: TBD Anticipated discharge date: TBD pending clinical development   Family Communication: Discussed with son-in-law   Author: Alban Pepper, MD [facility] 11/06/2023, 6:24 PM   Available by Epic secure chat 7AM-7PM. If 7PM-7AM, please contact night-coverage.  [facility] contact information found on [email or web address].

## 2023-11-06 NOTE — Consult Note (Signed)
 PHARMACY CONSULT NOTE - ELECTROLYTES  Pharmacy Consult for Electrolyte Monitoring and Replacement   Recent Labs: Height: 5' 8 (172.7 cm) Weight: 77.2 kg (170 lb 3.1 oz) IBW/kg (Calculated) : 63.9 Estimated Creatinine Clearance: 44.6 mL/min (A) (by C-G formula based on SCr of 1.41 mg/dL (H)). Potassium (mmol/L)  Date Value  11/06/2023 4.1  11/16/2013 4.0   Magnesium  (mg/dL)  Date Value  90/83/7974 1.9   Calcium  (mg/dL)  Date Value  90/82/7974 7.7 (L)   Calcium , Total (mg/dL)  Date Value  90/71/7984 7.9 (L)   Albumin  (g/dL)  Date Value  90/84/7974 1.9 (L)  07/06/2015 4.2  10/07/2012 2.9 (L)   Phosphorus (mg/dL)  Date Value  90/84/7974 2.5   Sodium (mmol/L)  Date Value  11/06/2023 137  07/06/2015 146 (H)  11/16/2013 131 (L)   Corrected Ca: 9.2 mg/dL  Assessment  Tricia [name] is a 63 y.o. female presenting with weakness and leg swelling. PMH significant for anxiety, osteoarthritis, asthma, COPD, depression, type 2 diabetes mellitus, GERD, dyslipidemia, and hypertension. Pharmacy has been consulted to monitor and replace electrolytes.  Diet: dysphagia 3 diet MIVF: N/A Pertinent medications: N/A  Goal of Therapy: Electrolytes WNL  Plan:  No other electrolyte replacement currently necessary Check BMP, Mg, and Phos with AM labs  Thank you for involving pharmacy in this patient's care.   Damien Napoleon, PharmD Clinical Pharmacist 11/06/2023 7:04 AM

## 2023-11-06 NOTE — Progress Notes (Signed)
 Physical Therapy Treatment Patient Details Name: Tricia [name] MRN: [id number] DOB: [birth year] Today's Date: 11/06/2023   History of Present Illness Pt is a 63 y.o. female with medical history significant of COPD, prediabetes, hyperlipidemia, GERD, migraine headaches, anxiety, depression, benign essential tremor that presented to ED for BLE swelling, weakness. Workup for cellulitis of BLE, AKI, admission complicated by septic shock and acute COPD exacerbation.    PT Comments  Pt received up in chair for co-tx with OT in order to safely progress functional mobility as pt is currently requiring significant assist for transfers. Pt continues to c/o B foot pain from neuropathy as well as back pain (unknown etiology). Pt pre-medicated prior to session, however only able to tolerate brief static standing at RW on 2nd attempt due to pain. Repeated multi-modal cues needed due to poor carry over. Pt also states she takes 600mg  of Neurontin  3x/day at home, currently on 300mg . MD and nursing notified.    If plan is discharge home, recommend the following: Two people to help with walking and/or transfers;Two people to help with bathing/dressing/bathroom;Help with stairs or ramp for entrance;Assist for transportation;Assistance with feeding;Assistance with cooking/housework   Can travel by private vehicle     No  Equipment Recommendations  Other (comment) (TBD)    Recommendations for Other Services       Precautions / Restrictions Precautions Precautions: Fall Recall of Precautions/Restrictions: Impaired Restrictions Weight Bearing Restrictions Per Provider Order: No     Mobility  Bed Mobility               General bed mobility comments:  (NT, in chair pre/post session)    Transfers Overall transfer level: Needs assistance Equipment used: Rolling walker (2 wheels) Transfers: Sit to/from Stand Sit to Stand: Mod assist, +2 physical assistance           General transfer  comment: Repeated cues for proper hand placement and technique. Pt stood x 2 from recliner, able to attain upright stance only briefly at RW due to increased pain in feet and back    Ambulation/Gait               General Gait Details:  (Unable due to pain)   Stairs             Wheelchair Mobility     Tilt Bed    Modified Rankin (Stroke Patients Only)       Balance                                            Communication Communication Communication: No apparent difficulties  Cognition Arousal: Alert Behavior During Therapy: WFL for tasks assessed/performed   PT - Cognitive impairments: No family/caregiver present to determine baseline                       PT - Cognition Comments: pt oriented to self, place, month as october Following commands: Impaired Following commands impaired: Follows one step commands with increased time    Cueing Cueing Techniques: Verbal cues, Gestural cues, Tactile cues  Exercises General Exercises - Lower Extremity Ankle Circles/Pumps: AROM, Both, 10 reps, Seated Long Arc Quad: AROM, Both, 20 reps, Seated    General Comments General comments (skin integrity, edema, etc.):  (R LE edema noted)      Pertinent Vitals/Pain Pain Assessment Pain Assessment: 0-10 Pain Score: 8  Pain Location: B feet and back Pain Descriptors / Indicators: Discomfort, Grimacing, Guarding, Sharp Pain Intervention(s): Premedicated before session, Limited activity within patient's tolerance    Home Living                          Prior Function            PT Goals (current goals can now be found in the care plan section) Acute Rehab PT Goals Patient Stated Goal: to have less pain    Frequency    Min 2X/week      PT Plan      Co-evaluation PT/OT/SLP Co-Evaluation/Treatment: Yes Reason for Co-Treatment: Complexity of the patient's impairments (multi-system involvement);For patient/therapist  safety PT goals addressed during session: Mobility/safety with mobility;Proper use of DME;Strengthening/ROM        AM-PAC PT 6 Clicks Mobility   Outcome Measure  Help needed turning from your back to your side while in a flat bed without using bedrails?: A Lot Help needed moving from lying on your back to sitting on the side of a flat bed without using bedrails?: A Lot Help needed moving to and from a bed to a chair (including a wheelchair)?: Total Help needed standing up from a chair using your arms (e.g., wheelchair or bedside chair)?: Total Help needed to walk in hospital room?: Total Help needed climbing 3-5 steps with a railing? : Total 6 Click Score: 8    End of Session Equipment Utilized During Treatment: Gait belt Activity Tolerance: Patient limited by fatigue;Patient limited by pain Patient left: in chair;with call bell/phone within reach;with chair alarm set Nurse Communication: Mobility status;Other (comment) (Neurontin  dose is lower than home dose) PT Visit Diagnosis: Other abnormalities of gait and mobility (R26.89);Difficulty in walking, not elsewhere classified (R26.2);Muscle weakness (generalized) (M62.81)     Time: 8866-8841 PT Time Calculation (min) (ACUTE ONLY): 25 min  Charges:    $Therapeutic Activity: 8-22 mins PT General Charges $$ ACUTE PT VISIT: 1 Visit                    Darice Bohr, PTA  Darice JAYSON Bohr 11/06/2023, 12:36 PM

## 2023-11-06 NOTE — Progress Notes (Addendum)
 [facility]  ROUNDING NOTE   Subjective:   Patient seen laying in bed Breakfast tray at bedside untouched Remains on room air Mild lower extremity edema  Serum creatinine 1.41   Objective:  Vital signs in last 24 hours:  Temp:  [97.7 F (36.5 C)-98.8 F (37.1 C)] 97.8 F (36.6 C) (09/17 0756) Pulse Rate:  [98-108] 101 (09/17 0756) Resp:  [14-18] 18 (09/17 0756) BP: (126-136)/(56-74) 136/74 (09/17 0756) SpO2:  [93 %-100 %] 100 % (09/17 0756) Weight:  [77.2 kg] 77.2 kg (09/17 0500)  Weight change: 5 kg Filed Weights   11/04/23 0500 11/05/23 0352 11/06/23 0500  Weight: 71.2 kg 72.2 kg 77.2 kg    Intake/Output: I/O last 3 completed shifts: In: 531.7 [NG/GT:431.7; IV Piggyback:100] Out: -    Intake/Output this shift:  No intake/output data recorded.  Physical Exam: General: NAD  Head: Normocephalic, atraumatic.    Eyes: Anicteric  Lungs:  Wheeze  Heart: Regular rate and rhythm  Abdomen:  Soft, nontender  Extremities:  + peripheral edema.  Neurologic: Awake, alert, conversant  Skin: BLE erythema        Basic Metabolic Panel: Recent Labs  Lab 11/01/23 0352 11/02/23 0400 11/03/23 0320 11/03/23 1459 11/04/23 0433 11/05/23 0841 11/06/23 0412  NA 131* 132* 136 134* 137 135 137  K 4.5 3.6 3.0* 3.4* 3.3* 3.6 4.1  CL 102 100 96* 93* 96* 93* 97*  CO2 18* 24 30 32 33* 30 31  GLUCOSE 217* 131* 101* 168* 81 127* 85  BUN 51* 48* 41* 38* 33* 27* 24*  CREATININE 3.51* 2.97* 2.15* 1.94* 1.63* 1.45* 1.41*  CALCIUM  7.2* 6.9* 6.6* 7.0* 7.3* 7.5* 7.7*  MG 1.6* 2.0 1.8 1.7 1.7 1.9  --   PHOS 5.3* 3.9 2.6  --  2.5  --   --     Liver Function Tests: Recent Labs  Lab 10/30/23 1928 11/01/23 0352 11/02/23 0400 11/03/23 0320 11/04/23 0433  AST 25  --   --   --   --   ALT 9  --   --   --   --   ALKPHOS 123  --   --   --   --   BILITOT 1.2  --   --   --   --   PROT 5.7*  --   --   --   --   ALBUMIN  2.0* <1.5* 1.5* <1.5* 1.9*   No results for input(s):  LIPASE, AMYLASE in the last 168 hours. No results for input(s): AMMONIA in the last 168 hours.  CBC: Recent Labs  Lab 10/30/23 1928 10/31/23 0610 11/02/23 0400 11/02/23 1215 11/03/23 0320 11/04/23 0433 11/05/23 0841  WBC 10.1   < > 4.4 4.4 5.4 5.7 8.8  NEUTROABS 8.9*  --   --  3.7  --   --   --   HGB 11.8*   < > 7.2* 8.0* 8.1* 7.3* 8.0*  HCT 36.9   < > 21.2* 23.1* 23.0* 21.4* 23.8*  MCV 97.4   < > 92.2 91.7 91.3 92.6 94.8  PLT 367   < > 67* 61* 57* 70* 129*   < > = values in this interval not displayed.    Cardiac Enzymes: Recent Labs  Lab 10/30/23 2213  CKTOTAL 38    BNP: Invalid input(s): POCBNP  CBG: Recent Labs  Lab 11/05/23 1640 11/05/23 1959 11/06/23 0020 11/06/23 0449 11/06/23 0819  GLUCAP 85 105* 93 82 82    Microbiology: Results for  orders placed or performed during the hospital encounter of 10/30/23  Blood culture (routine x 2)     Status: Abnormal   Collection Time: 10/30/23  7:28 PM   Specimen: BLOOD  Result Value Ref Range Status   Specimen Description   Final    BLOOD LEFT ANTECUBITAL Performed at [facility], [address] [place], [place] [address]    Special Requests   Final    BLOOD Blood Culture adequate volume Performed at [facility], [address] [place], [place] [address]    Culture  Setup Time   Final    GRAM NEGATIVE RODS IN BOTH AEROBIC AND ANAEROBIC BOTTLES CRITICAL RESULT CALLED TO, READ BACK BY AND VERIFIED WITHBETHA ESTILL LUTES PHARMD [id number] 10/31/23 HNM GRAM STAIN REVIEWED-AGREE WITH RESULT DRT Performed at [facility], [address], [place], [place] [address]    Culture SERRATIA MARCESCENS (A)  Final   Report Status 11/02/2023 FINAL  Final   Organism ID, Bacteria SERRATIA MARCESCENS  Final      Susceptibility   Serratia marcescens - MIC*    CEFEPIME  <=0.12 SENSITIVE Sensitive     ERTAPENEM <=0.12 SENSITIVE Sensitive     CEFTRIAXONE  <=0.25 SENSITIVE Sensitive      CIPROFLOXACIN  <=0.06 SENSITIVE Sensitive     GENTAMICIN <=1 SENSITIVE Sensitive     MEROPENEM  <=0.25 SENSITIVE Sensitive     TRIMETH /SULFA  <=20 SENSITIVE Sensitive     * SERRATIA MARCESCENS  Blood Culture ID Panel (Reflexed)     Status: Abnormal   Collection Time: 10/30/23  7:28 PM  Result Value Ref Range Status   Enterococcus faecalis NOT DETECTED NOT DETECTED Final   Enterococcus Faecium NOT DETECTED NOT DETECTED Final   Listeria monocytogenes NOT DETECTED NOT DETECTED Final   Staphylococcus species NOT DETECTED NOT DETECTED Final   Staphylococcus aureus (BCID) NOT DETECTED NOT DETECTED Final   Staphylococcus epidermidis NOT DETECTED NOT DETECTED Final   Staphylococcus lugdunensis NOT DETECTED NOT DETECTED Final   Streptococcus species NOT DETECTED NOT DETECTED Final   Streptococcus agalactiae NOT DETECTED NOT DETECTED Final   Streptococcus pneumoniae NOT DETECTED NOT DETECTED Final   Streptococcus pyogenes NOT DETECTED NOT DETECTED Final   A.calcoaceticus-baumannii NOT DETECTED NOT DETECTED Final   Bacteroides fragilis NOT DETECTED NOT DETECTED Final   Enterobacterales DETECTED (A) NOT DETECTED Final    Comment: Enterobacterales represent a large order of gram negative bacteria, not a single organism. CRITICAL RESULT CALLED TO, READ BACK BY AND VERIFIED WITH: SHEEMA HALLAJI PHARMD [id number] 10/31/23 HNM    Enterobacter cloacae complex NOT DETECTED NOT DETECTED Final   Escherichia coli NOT DETECTED NOT DETECTED Final   Klebsiella aerogenes NOT DETECTED NOT DETECTED Final   Klebsiella oxytoca NOT DETECTED NOT DETECTED Final   Klebsiella pneumoniae NOT DETECTED NOT DETECTED Final   Proteus species NOT DETECTED NOT DETECTED Final   Salmonella species NOT DETECTED NOT DETECTED Final   Serratia marcescens DETECTED (A) NOT DETECTED Final    Comment: CRITICAL RESULT CALLED TO, READ BACK BY AND VERIFIED WITH: ESTILL LUTES PHARMD [id number] 10/31/23 HNM    Haemophilus influenzae NOT DETECTED NOT  DETECTED Final   Neisseria meningitidis NOT DETECTED NOT DETECTED Final   Pseudomonas aeruginosa NOT DETECTED NOT DETECTED Final   Stenotrophomonas maltophilia NOT DETECTED NOT DETECTED Final   Candida albicans NOT DETECTED NOT DETECTED Final   Candida auris NOT DETECTED NOT DETECTED Final   Candida glabrata NOT DETECTED NOT DETECTED Final   Candida krusei NOT DETECTED  NOT DETECTED Final   Candida parapsilosis NOT DETECTED NOT DETECTED Final   Candida tropicalis NOT DETECTED NOT DETECTED Final   Cryptococcus neoformans/gattii NOT DETECTED NOT DETECTED Final   CTX-M ESBL NOT DETECTED NOT DETECTED Final   Carbapenem resistance IMP NOT DETECTED NOT DETECTED Final   Carbapenem resistance KPC NOT DETECTED NOT DETECTED Final   Carbapenem resistance NDM NOT DETECTED NOT DETECTED Final   Carbapenem resist OXA 48 LIKE NOT DETECTED NOT DETECTED Final   Carbapenem resistance VIM NOT DETECTED NOT DETECTED Final    Comment: Performed at [facility], [address] [place], [place] [address]  Blood culture (routine x 2)     Status: Abnormal   Collection Time: 10/30/23  7:55 PM   Specimen: BLOOD  Result Value Ref Range Status   Specimen Description   Final    BLOOD BLOOD RIGHT ARM Performed at [facility], [address] [place], [place] [address]    Special Requests   Final    BOTTLES DRAWN AEROBIC AND ANAEROBIC Blood Culture results may not be optimal due to an inadequate volume of blood received in culture bottles Performed at [facility], [address] [place], [place] [address]    Culture  Setup Time   Final    GRAM NEGATIVE RODS IN BOTH AEROBIC AND ANAEROBIC BOTTLES CRITICAL VALUE NOTED.  VALUE IS CONSISTENT WITH PREVIOUSLY REPORTED AND CALLED VALUE. GRAM STAIN REVIEWED-AGREE WITH RESULT DRT    Culture (A)  Final    SERRATIA MARCESCENS SUSCEPTIBILITIES PERFORMED ON PREVIOUS CULTURE WITHIN THE LAST 5 DAYS. Performed at [facility], [address], [place], [place] [address]    Report Status 11/02/2023 FINAL  Final  Resp panel by RT-PCR (RSV, Flu A&B, Covid) Anterior Nasal Swab     Status: None   Collection Time: 10/30/23  8:01 PM   Specimen: Anterior Nasal Swab  Result Value Ref Range Status   SARS Coronavirus 2 by RT PCR NEGATIVE NEGATIVE Final    Comment: (NOTE) SARS-CoV-2 target nucleic acids are NOT DETECTED.  The SARS-CoV-2 RNA is generally detectable in upper respiratory specimens during the acute phase of infection. The lowest concentration of SARS-CoV-2 viral copies this assay can detect is 138 copies/mL. A negative result does not preclude SARS-Cov-2 infection and should not be used as the sole basis for treatment or other patient management decisions. A negative result may occur with  improper specimen collection/handling, submission of specimen other than nasopharyngeal swab, presence of viral mutation(s) within the areas targeted by this assay, and inadequate number of viral copies(<138 copies/mL). A negative result must be combined with clinical observations, patient history, and epidemiological information. The expected result is Negative.  Fact Sheet for Patients:  BloggerCourse.com  Fact Sheet for Healthcare Providers:  SeriousBroker.it  This test is no t yet approved or cleared by the [place]  FDA and  has been authorized for detection and/or diagnosis of SARS-CoV-2 by FDA under an Emergency Use Authorization (EUA). This EUA will remain  in effect (meaning this test can be used) for the duration of the COVID-19 declaration under Section 564(b)(1) of the Act, 21 U.S.C.section 360bbb-3(b)(1), unless the authorization is terminated  or revoked sooner.       Influenza A by PCR NEGATIVE NEGATIVE Final   Influenza B by PCR NEGATIVE NEGATIVE Final    Comment: (NOTE) The Xpert Xpress SARS-CoV-2/FLU/RSV plus assay is intended as an aid in the diagnosis  of influenza from Nasopharyngeal swab specimens  and should not be used as a sole basis for treatment. Nasal washings and aspirates are unacceptable for Xpert Xpress SARS-CoV-2/FLU/RSV testing.  Fact Sheet for Patients: BloggerCourse.com  Fact Sheet for Healthcare Providers: SeriousBroker.it  This test is not yet approved or cleared by the [place]  FDA and has been authorized for detection and/or diagnosis of SARS-CoV-2 by FDA under an Emergency Use Authorization (EUA). This EUA will remain in effect (meaning this test can be used) for the duration of the COVID-19 declaration under Section 564(b)(1) of the Act, 21 U.S.C. section 360bbb-3(b)(1), unless the authorization is terminated or revoked.     Resp Syncytial Virus by PCR NEGATIVE NEGATIVE Final    Comment: (NOTE) Fact Sheet for Patients: BloggerCourse.com  Fact Sheet for Healthcare Providers: SeriousBroker.it  This test is not yet approved or cleared by the [place]  FDA and has been authorized for detection and/or diagnosis of SARS-CoV-2 by FDA under an Emergency Use Authorization (EUA). This EUA will remain in effect (meaning this test can be used) for the duration of the COVID-19 declaration under Section 564(b)(1) of the Act, 21 U.S.C. section 360bbb-3(b)(1), unless the authorization is terminated or revoked.  Performed at [facility], [address] [place], [place] [address]   MRSA Next Gen by PCR, Nasal     Status: None   Collection Time: 10/31/23  2:05 PM   Specimen: Nasal Mucosa; Nasal Swab  Result Value Ref Range Status   MRSA by PCR Next Gen NOT DETECTED NOT DETECTED Final    Comment: (NOTE) The GeneXpert MRSA Assay (FDA approved for NASAL specimens only), is one component of a comprehensive MRSA colonization surveillance program. It is not intended to diagnose MRSA infection nor to  guide or monitor treatment for MRSA infections. Test performance is not FDA approved in patients less than 47 years old. Performed at [facility], [address] [place], [place] [address]     Coagulation Studies: No results for input(s): LABPROT, INR in the last 72 hours.   Urinalysis: No results for input(s): COLORURINE, LABSPEC, PHURINE, GLUCOSEU, HGBUR, BILIRUBINUR, KETONESUR, PROTEINUR, UROBILINOGEN, NITRITE, LEUKOCYTESUR in the last 72 hours.  Invalid input(s): APPERANCEUR     Imaging: No results found.    Medications:    cefTRIAXone  (ROCEPHIN )  IV Stopped (11/05/23 1458)    budesonide  (PULMICORT ) nebulizer solution  0.5 mg Nebulization BID   carbidopa -levodopa   1 tablet Oral QHS   carbidopa -levodopa   1 tablet Oral TID WC   Chlorhexidine  Gluconate Cloth  6 each Topical Daily   DULoxetine   60 mg Oral Daily   feeding supplement  237 mL Oral BID BM   gabapentin   300 mg Oral TID   insulin  aspart  0-9 Units Subcutaneous Q4H   ipratropium-albuterol   3 mL Nebulization BID   leptospermum manuka honey  1 Application Topical Daily   lidocaine   1 patch Transdermal Q24H   lipase/protease/amylase  12,000 Units Oral TID AC   midodrine   2.5 mg Oral TID WC   multivitamin with minerals  1 tablet Oral Daily   QUEtiapine   25 mg Oral QHS   theophylline   400 mg Oral Daily   thiamine   100 mg Oral Daily   acetaminophen , acetaminophen , [DISCONTINUED] insulin  aspart **AND** dextrose  **AND** POCT CBG monitoring, ipratropium-albuterol , morphine  injection, [DISCONTINUED] ondansetron  **OR** ondansetron  (ZOFRAN ) IV, mouth rinse, oxyCODONE   Assessment/ Plan:  Ms. KAELYNNE CHRISTLEY is a 63 y.o.  female with past medical history including COPD, type 2 diabetes, anxiety, depression, GERD and hypertension, who  was admitted to [facility] on 10/30/2023 for Hyperkalemia [E87.5] Hyponatremia [E87.1] Weakness [R53.1] Leg swelling [M79.89] Wound infection  [T14.8XXA, L08.9] AKI (acute kidney injury) (HCC) [N17.9] Sepsis (HCC) [A41.9] Sepsis due to cellulitis (HCC) [L03.90, A41.9] Sepsis, due to unspecified organism, unspecified whether acute organ dysfunction present (HCC) [A41.9]   Acute kidney injury likely secondary to multiple factors: infectious process (gram-negative sepsis), hypotension and volume depletion. Normal renal function noted last month. Found down by son. CK 58. Renal US -nonobstructive left kidney stone.  Creatinine continues to improve. Will continue to follow up with patient at discharge.   Lab Results  Component Value Date   CREATININE 1.41 (H) 11/06/2023   CREATININE 1.45 (H) 11/05/2023   CREATININE 1.63 (H) 11/04/2023    Intake/Output Summary (Last 24 hours) at 11/06/2023 1121 Last data filed at 11/05/2023 1900 Gross per 24 hour  Intake 100 ml  Output --  Net 100 ml   2. Acute metabolic acidosis, S bicarb 13->24->32->33 > 30 - Sodium bicarbonate  corrected with IVF  3. Anemia in chronic kidney disease Lab Results  Component Value Date   HGB 8.0 (L) 11/05/2023    Hemoglobin 8.0 at last check.  Monitor for bleeding.  4. Sepsis secondary to cellulitis, bilateral lower extremities.  Gram-negative sepsis.   Antibiotics as per primary team, ceftriaxone .  5.  Hypokalemia Potassium 4.1, corrected   Due to consistent renal improvements, we will sign off at this time.    LOS: 7 Kirstine Jacquin 9/17/202511:21 AM

## 2023-11-06 NOTE — Progress Notes (Signed)
 Occupational Therapy Treatment Patient Details Name: Tricia [name] MRN: [id number] DOB: [birth year] Today's Date: 11/06/2023   History of present illness Pt is a 63 y.o. female with medical history significant of COPD, prediabetes, hyperlipidemia, GERD, migraine headaches, anxiety, depression, benign essential tremor that presented to ED for BLE swelling, weakness. Workup for cellulitis of BLE, AKI, admission complicated by septic shock and acute COPD exacerbation.   OT comments  Pt seen for OT/PT co-treatment on this date. Upon arrival to room pt seated in the recliner, agreeable to tx. Pt requires MODA +2 for STS from recliner, required 2 standing attempts due to increased pain levels. Pt able to come to complete stance on second attempt >30 seconds with UE support from RW. Pt c/o Bilateral foot pain seems greater in the RLE. Frequent reminder for hand placement during STS attempts. Pt required MAXA for donning bilateral socks and MAXA for seated grooming tasks to assist pt in getting knots out of her hair. Pt making good progress toward goals, will continue to follow POC. Discharge recommendation remains appropriate.        If plan is discharge home, recommend the following:  Two people to help with walking and/or transfers;Two people to help with bathing/dressing/bathroom;Assistance with cooking/housework;Assist for transportation;Help with stairs or ramp for entrance;Supervision due to cognitive status;Direct supervision/assist for medications management   Equipment Recommendations  Other (comment)    Recommendations for Other Services      Precautions / Restrictions Precautions Precautions: Fall Recall of Precautions/Restrictions: Impaired Restrictions Weight Bearing Restrictions Per Provider Order: No       Mobility Bed Mobility Overal bed mobility: Needs Assistance             General bed mobility comments: NT pt in recliner pre/post session (NT, in chair pre/post  session)    Transfers Overall transfer level: Needs assistance Equipment used: Rolling walker (2 wheels) Transfers: Sit to/from Stand Sit to Stand: Mod assist, +2 physical assistance           General transfer comment: Pt required MODA +2 for STS from recliner, two attempts 2nd time able to come to full stance fro brief time with use of RW. Frequent reminders for hand placement.     Balance Overall balance assessment: Needs assistance Sitting-balance support: Feet supported, No upper extremity supported Sitting balance-Leahy Scale: Fair Sitting balance - Comments: Limited WB through RLE per pain reported from pt   Standing balance support: Bilateral upper extremity supported, Reliant on assistive device for balance Standing balance-Leahy Scale: Poor Standing balance comment: Heavy reliant on RW in standing to off weight RLE                           ADL either performed or assessed with clinical judgement   ADL Overall ADL's : Needs assistance/impaired Eating/Feeding: Set up;Sitting   Grooming: Wash/dry face;Wash/dry hands;Sitting;Set up               Lower Body Dressing: Maximal assistance;Sitting/lateral leans Lower Body Dressing Details (indicate cue type and reason): Attempted but unable to reach socks             Functional mobility during ADLs: Moderate assistance;Rolling walker (2 wheels);+2 for safety/equipment General ADL Comments: Anticipate MAXA pericare, due to painful RLE     Communication Communication Communication: Impaired Factors Affecting Communication: Difficulty expressing self   Cognition Arousal: Alert Behavior During Therapy: WFL for tasks assessed/performed Cognition: No family/caregiver present to determine baseline  Following commands: Impaired Following commands impaired: Follows one step commands with increased time      Cueing   Cueing Techniques: Verbal cues, Gestural  cues, Tactile cues  Exercises Exercises: Other exercises Other Exercises Other Exercises: Edu: Role of OT session, benefits of attempting mobility to prevent further decline, hand placement technique           General Comments RLE redness and edema noted    Pertinent Vitals/ Pain       Pain Assessment Pain Assessment: 0-10 Pain Score: 8  Pain Location: B feet and back Pain Descriptors / Indicators: Discomfort, Grimacing, Guarding, Sharp Pain Intervention(s): Premedicated before session, Repositioned, Monitored during session                                                          Frequency  Min 2X/week        Progress Toward Goals  OT Goals(current goals can now be found in the care plan section)  Progress towards OT goals: Progressing toward goals  Acute Rehab OT Goals OT Goal Formulation: With patient Time For Goal Achievement: 11/16/23 Potential to Achieve Goals: Good ADL Goals Pt Will Perform Grooming: sitting;with set-up;with supervision Pt Will Perform Lower Body Dressing: with min assist;sit to/from stand;sitting/lateral leans;with adaptive equipment Pt Will Transfer to Toilet: ambulating;bedside commode;with contact guard assist  Plan      Co-evaluation    PT/OT/SLP Co-Evaluation/Treatment: Yes Reason for Co-Treatment: Complexity of the patient's impairments (multi-system involvement);For patient/therapist safety PT goals addressed during session: Mobility/safety with mobility;Proper use of DME;Strengthening/ROM OT goals addressed during session: ADL's and self-care;Proper use of Adaptive equipment and DME      AM-PAC OT 6 Clicks Daily Activity     Outcome Measure   Help from another person eating meals?: A Little Help from another person taking care of personal grooming?: A Lot Help from another person toileting, which includes using toliet, bedpan, or urinal?: Total Help from another person bathing (including washing,  rinsing, drying)?: A Lot Help from another person to put on and taking off regular upper body clothing?: A Lot Help from another person to put on and taking off regular lower body clothing?: A Lot 6 Click Score: 12    End of Session Equipment Utilized During Treatment: Gait belt;Rolling walker (2 wheels);Oxygen   OT Visit Diagnosis: Other abnormalities of gait and mobility (R26.89);Muscle weakness (generalized) (M62.81);Pain Pain - part of body: Knee;Leg;Ankle and joints of foot   Activity Tolerance Patient tolerated treatment well   Patient Left in chair;with call bell/phone within reach;with chair alarm set   Nurse Communication Mobility status;Other (comment) (Oxygen  levels)        Time: 8866-8799 OT Time Calculation (min): 27 min  Charges: OT General Charges $OT Visit: 1 Visit OT Treatments $Self Care/Home Management : 8-22 mins  Larraine Colas M.S. OTR/L  11/06/23, 1:16 PM

## 2023-11-07 DIAGNOSIS — A419 Sepsis, unspecified organism: Secondary | ICD-10-CM | POA: Diagnosis not present

## 2023-11-07 DIAGNOSIS — L0390 Cellulitis, unspecified: Secondary | ICD-10-CM | POA: Diagnosis not present

## 2023-11-07 LAB — RENAL FUNCTION PANEL
Albumin: 2 g/dL — ABNORMAL LOW (ref 3.5–5.0)
Anion gap: 7 (ref 5–15)
BUN: 20 mg/dL (ref 8–23)
CO2: 28 mmol/L (ref 22–32)
Calcium: 7.4 mg/dL — ABNORMAL LOW (ref 8.9–10.3)
Chloride: 100 mmol/L (ref 98–111)
Creatinine, Ser: 1.26 mg/dL — ABNORMAL HIGH (ref 0.44–1.00)
GFR, Estimated: 48 mL/min — ABNORMAL LOW (ref >60)
Glucose, Bld: 81 mg/dL (ref 70–99)
Phosphorus: 2.6 mg/dL (ref 2.5–4.6)
Potassium: 3.3 mmol/L — ABNORMAL LOW (ref 3.5–5.1)
Sodium: 135 mmol/L (ref 135–145)

## 2023-11-07 LAB — CBC
HCT: 23.1 % — ABNORMAL LOW (ref 36.0–46.0)
Hemoglobin: 7.6 g/dL — ABNORMAL LOW (ref 12.0–15.0)
MCH: 31.3 pg (ref 26.0–34.0)
MCHC: 32.9 g/dL (ref 30.0–36.0)
MCV: 95.1 fL (ref 80.0–100.0)
Platelets: 223 K/uL (ref 150–400)
RBC: 2.43 MIL/uL — ABNORMAL LOW (ref 3.87–5.11)
RDW: 14.6 % (ref 11.5–15.5)
WBC: 8.8 K/uL (ref 4.0–10.5)
nRBC: 0 % (ref 0.0–0.2)

## 2023-11-07 LAB — BRAIN NATRIURETIC PEPTIDE: B Natriuretic Peptide: 119.1 pg/mL — ABNORMAL HIGH (ref 0.0–100.0)

## 2023-11-07 LAB — GLUCOSE, CAPILLARY
Glucose-Capillary: 88 mg/dL (ref 70–99)
Glucose-Capillary: 153 mg/dL — ABNORMAL HIGH (ref 70–99)

## 2023-11-07 LAB — MAGNESIUM: Magnesium: 1.7 mg/dL (ref 1.7–2.4)

## 2023-11-07 MED ORDER — POTASSIUM CHLORIDE 20 MEQ PO PACK
20.0000 meq | PACK | Freq: Once | ORAL | Status: AC
  Administered 2023-11-07: 20 meq via ORAL
  Filled 2023-11-07: qty 1

## 2023-11-07 MED ORDER — VITAMIN D (ERGOCALCIFEROL) 1.25 MG (50000 UNIT) PO CAPS
50000.0000 [IU] | ORAL_CAPSULE | ORAL | Status: DC
  Administered 2023-11-07 – 2023-11-14 (×2): 50000 [IU] via ORAL
  Filled 2023-11-07 (×2): qty 1

## 2023-11-07 MED ORDER — MAGNESIUM SULFATE 2 GM/50ML IV SOLN
2.0000 g | Freq: Once | INTRAVENOUS | Status: AC
Start: 2023-11-07 — End: 2023-11-07
  Administered 2023-11-07: 2 g via INTRAVENOUS
  Filled 2023-11-07: qty 50

## 2023-11-07 MED ORDER — DM-GUAIFENESIN ER 30-600 MG PO TB12
1.0000 | ORAL_TABLET | Freq: Two times a day (BID) | ORAL | Status: AC
  Administered 2023-11-07 – 2023-11-12 (×10): 1 via ORAL
  Filled 2023-11-07 (×10): qty 1

## 2023-11-07 NOTE — Consult Note (Signed)
 PHARMACY CONSULT NOTE - ELECTROLYTES  Pharmacy Consult for Electrolyte Monitoring and Replacement   Recent Labs: Height: 5' 8 (172.7 cm) Weight: 77.2 kg (170 lb 3.1 oz) IBW/kg (Calculated) : 63.9 Estimated Creatinine Clearance: 49.9 mL/min (A) (by C-G formula based on SCr of 1.26 mg/dL (H)). Potassium (mmol/L)  Date Value  11/07/2023 3.3 (L)  11/16/2013 4.0   Magnesium  (mg/dL)  Date Value  90/81/7974 1.7   Calcium  (mg/dL)  Date Value  90/81/7974 7.4 (L)   Calcium , Total (mg/dL)  Date Value  90/71/7984 7.9 (L)   Albumin  (g/dL)  Date Value  90/81/7974 2.0 (L)  07/06/2015 4.2  10/07/2012 2.9 (L)   Phosphorus (mg/dL)  Date Value  90/81/7974 2.6   Sodium (mmol/L)  Date Value  11/07/2023 135  07/06/2015 146 (H)  11/16/2013 131 (L)   Corrected Ca: 9.2 mg/dL  Assessment  Tricia [name] is a 63 y.o. female presenting with weakness and leg swelling. PMH significant for anxiety, osteoarthritis, asthma, COPD, depression, type 2 diabetes mellitus, GERD, dyslipidemia, and hypertension. Pharmacy has been consulted to monitor and replace electrolytes.  Diet: dysphagia 3 diet MIVF: N/A Pertinent medications: N/A  Goal of Therapy: Electrolytes WNL  Plan:  K 3.3: Give KCL 20 mEq PO x1 Mag 1.7: Give magnesium  sulfate 2 g IV x1 Check BMP, Mg, and Phos with AM labs  Thank you for involving pharmacy in this patient's care.   Damien Napoleon, PharmD Clinical Pharmacist 11/07/2023 7:19 AM

## 2023-11-07 NOTE — Progress Notes (Addendum)
 Nutrition Follow-up  DOCUMENTATION CODES:   Severe malnutrition in context of chronic illness  INTERVENTION:   -Continue dysphagia 3 diet  -MVI with minerals daily -Ensure Plus High Protein po BID, each supplement provides 350 kcal and 20 grams of protein  -Magic cup TID with meals, each supplement provides 290 kcal and 9 grams of protein  -50,000 units vitamin D  weekly -Extra sugar packets with breakfast per pt request  NUTRITION DIAGNOSIS:   Severe Malnutrition related to chronic illness (COPD) as evidenced by moderate fat depletion, severe fat depletion, moderate muscle depletion, severe muscle depletion, percent weight loss.  Ongoing  GOAL:   Patient will meet greater than or equal to 90% of their needs  Progressing   MONITOR:   PO intake, Supplement acceptance, Labs, Weight trends, TF tolerance, I & O's, Skin  REASON FOR ASSESSMENT:   Consult Assessment of nutrition requirement/status, Enteral/tube feeding initiation and management  ASSESSMENT:   63 y/o female with h/o CKD III, HLD, Parkinson's dementia, anxiety, MDD, COPD, GERD, DM, CHF, chronic pain, HTN, gout, HOH, tremor and recent admission for syncope, AKI, orthostatic hypotension, polypharmacy and urinary retention and who is now admitted with cellulitis of bilateral lower extremities, bacteremia, septic shock, AKI and COPD exacerbation.  9/11- s/p BSE- NPO 9/12- s/p BSE- NPO, NGT placed- KUB confirmed side port and tip of tube in stomach 9/13- s/p BSE- dysphagia 3 diet with thin liquids 9/16- NGT removed  Reviewed I/O's: +333 ml x 24 hours and +6.2 L since admission  Spoke with pt at bedside, who was much more awake and alert in comparison to prior RD visit. She reports feeling better and happy to be making progress.   Pt shares that she has a fair appetite. Her appetite comes and goes at baseline and reports that she often grazes and drinks one Ensure supplement daily PTA. Pt reports that intake is  variable now based upon what food she is given. She usually eats a good breakfast, but reports she does not get enough sugar packets for her coffee and grits. Per pt, she does not like drinking Ensure in the morning, but open to adjusting timing to later in the day.   Noted meal completions 40%.   No wt loss since admission.   Discussed importance of good meal and supplement intake to promote healing. Pt amenable to supplements.   Per TOC notes, plan for SNF placement once medically stable.   Medications reviewed and include sinemet , neurontin , creon , and midodrine .  Labs reviewed: K: 3.3, CBGS: 88-153 (inpatient orders for glycemic control are none). Vitamin D : 26.14.  Diet Order:   Diet Order             DIET DYS 3 Room service appropriate? Yes with Assist; Fluid consistency: Thin  Diet effective now                   EDUCATION NEEDS:   Education needs have been addressed  Skin:  Skin Assessment: Skin Integrity Issues: Skin Integrity Issues:: Other (Comment), DTI DTI: sacrum Other: full thickness to trauma wounds to bilateral lower legs  Last BM:  11/07/23 (type 6)  Height:   Ht Readings from Last 1 Encounters:  10/31/23 5' 8 (1.727 m)    Weight:   Wt Readings from Last 1 Encounters:  11/07/23 77.2 kg    Ideal Body Weight:  63.6 kg  BMI:  Body mass index is 25.88 kg/m.  Estimated Nutritional Needs:   Kcal:  1700-2000kcal/day  Protein:  85-100g/day  Fluid:  1.7-2.0L/day    Margery ORN, RD, LDN, CDCES Registered Dietitian III Certified Diabetes Care and Education Specialist If unable to reach this RD, please use RD Inpatient group chat on secure chat between hours of 8am-4 pm daily

## 2023-11-07 NOTE — Progress Notes (Signed)
 PROGRESS NOTE Tricia [name]    DOB: 04-03-1960, 63 y.o.  FMW:[id number]    Code Status: Full Code   DOA: 10/30/2023   LOS: 8  Brief hospital course  Tricia [name] is a 63 y.o. female with a PMH significant for anxiety, osteoarthritis, asthma, COPD, depression, type 2 diabetes mellitus, GERD, dyslipidemia, and HTN, who presented to the emergency room with acute onset of generalized weakness and bilateral lower extremity swelling after being found down after several hours.   ED Course: BP was 109/53 with heart rate of 114 and later BP was 96/52 and temperature was 99.8, respiratory rate was 22. Labs reveal hyponatremia 128 and hyperkalemia of 6 with a CO2 of 13 and glucose of 126, BUN of 42 and creatinine 3.64 calcium  of 7.8 and albumin  2 with total protein 5.7.  BNP was 78.5 and high-sensitivity troponin I was 14.  Lactic acid was 2.6 and CBC showed hemoglobin 11.8 and hematocrit 36.9.  Respiratory panel came back negative.  Blood cultures were drawn. EKG: sinus tachycardia with rate 118 with poor R wave progression. Imaging: Portable chest x-ray showed emphysema with no acute cardiopulmonary disease. The patient was given IV vancomycin  and  IV cefepime .  On initial evaluation- patient was lethargic and minimally responsive. BP low and not responding to IV fluids or increase in midodrine . CCM was consulted who took over care for patient who required vasopressors. Weaned from them 9/14. NG tube was placed for nutrition as she was unable to tolerate PO while incapacitated.  9/15: transferred back to [facility]  11/07/23 -patient BP stable, will mean midodrine . PO intake is low but consistent, will dc NG tube and add PO supplements. Renal function improved- remove tunneled cath as she is not likely to need HD. Respiratory status- improved back to baseline O2 use. Mobility is greatly reduced- she likely needs SNF but needs to be reevaluated by PT/   Assessment & Plan  Principal Problem:    Sepsis due to cellulitis [facility]) Active Problems:   AKI (acute kidney injury) (HCC)   Hyperkalemia   Asthma, chronic   Hyponatremia   Parkinson's disease (HCC)   GERD without esophagitis   Dyslipidemia   Gout   Sepsis (HCC)   Leg swelling  Septic shockdue to cellulitis of lower extremities- Resolved Serratia bacteremia manifested by tachycardia and hypotension, elevated LA, renal failure.Weaned off pressors 9/14.  On midodrine  at home 5 mg 3 times daily.  Will continue to wean as able - Continue antibiotic therapy with IV ceftriaxone , will transition to PO in the AM  - Wound care consult - PT/OT and dispo planning. Patient amenable to SNF, as her son-in-law is unable to adequately care for  Severe calorie malnutrition  Patient tolerating PO well.  - Continue meal supplements -Have speech re-eval patient  Chronic hypoxic respiratory failure- on 2L [place] chronically at home. Remains on 2L currently  - continue nebs   Depression- continue home duloxetine , seroquel     AKI  Acute renal failure- Cr significantly increased from baseline normal to 3.64 on presentation. unclear etiology Likely due to hemodynamics.  Improved this a.m. Continue to avoid nephrotoxic agents  Diabetes- diet controlled. A1c 5.1.     Hyponatremia  Hyperkalemia- Supplement potassium    Asthma, chronic - Not in exacerbation. continue theophylline , Singulair , and bronchodilator inhaler.   GERD without esophagitis -continue PPI therapy.   Parkinson's disease (HCC) - continue Sinemet  CR and Sinemet  IR.   Dyslipidemia- not on statin   Gout- not acutely  exacerbated  Peripheral neuralgia - continue gabapentin   Body mass index is 25.88 kg/m.  VTE ppx:   Diet:     Diet   DIET DYS 3 Room service appropriate? Yes with Assist; Fluid consistency: Thin   Consultants: CCM Nephrology   Subjective 11/07/23    No issues overnight   Objective  Blood pressure (!) 95/50, pulse (!) 109, temperature  99.5 F (37.5 C), temperature source Axillary, resp. rate 14, height 5' 8 (1.727 m), weight 70.3 kg, SpO2 100%.  Intake/Output Summary (Last 24 hours) at 11/07/2023 1832 Last data filed at 11/07/2023 1300 Gross per 24 hour  Intake 480 ml  Output --  Net 480 ml   Filed Weights   11/05/23 0352 11/06/23 0500 11/07/23 0245  Weight: 72.2 kg 77.2 kg 77.2 kg    Physical Exam  Constitutional: In no distress.  Cardiovascular: Normal rate, regular rhythm. No lower extremity edema  Pulmonary: Non labored breathing on room air, no wheezing or rales.   Abdominal: Soft. Non distended and non tender Musculoskeletal: Normal range of motion.     Neurological: Alert and oriented to person, place, and time. Non focal  Skin: BLE with bandages, hyperpigmentation up to 3/4 of shin.    Labs   I have personally reviewed the following labs and imaging studies CBC    Component Value Date/Time   WBC 8.8 11/07/2023 0452   RBC 2.43 (L) 11/07/2023 0452   HGB 7.6 (L) 11/07/2023 0452   HGB 13.7 11/16/2013 2040   HCT 23.1 (L) 11/07/2023 0452   HCT 40.9 11/16/2013 2040   PLT 223 11/07/2023 0452   PLT 254 11/16/2013 2040   MCV 95.1 11/07/2023 0452   MCV 94 11/16/2013 2040   MCH 31.3 11/07/2023 0452   MCHC 32.9 11/07/2023 0452   RDW 14.6 11/07/2023 0452   RDW 14.0 11/16/2013 2040   LYMPHSABS 0.4 (L) 11/02/2023 1215   LYMPHSABS 2.7 11/16/2013 2040   MONOABS 0.2 11/02/2023 1215   MONOABS 0.6 11/16/2013 2040   EOSABS 0.0 11/02/2023 1215   EOSABS 0.1 11/16/2013 2040   BASOSABS 0.0 11/02/2023 1215   BASOSABS 0.1 11/16/2013 2040   BASOSABS 0 11/15/2011 1430      Latest Ref Rng & Units 11/07/2023    4:52 AM 11/06/2023    4:12 AM 11/05/2023    8:41 AM  BMP  Glucose 70 - 99 mg/dL 81  85  872   BUN 8 - 23 mg/dL 20  24  27    Creatinine 0.44 - 1.00 mg/dL 8.73  8.58  8.54   Sodium 135 - 145 mmol/L 135  137  135   Potassium 3.5 - 5.1 mmol/L 3.3  4.1  3.6   Chloride 98 - 111 mmol/L 100  97  93   CO2 22 -  32 mmol/L 28  31  30    Calcium  8.9 - 10.3 mg/dL 7.4  7.7  7.5    No results found.  Disposition Plan & Communication  Patient status: Inpatient  Admitted From: Home Planned disposition location: SNF Anticipated discharge date: TBD pending clinical development   Family Communication: None at bedside    Author: Alban Pepper, MD [facility] 11/07/2023, 6:32 PM   Available by Epic secure chat 7AM-7PM. If 7PM-7AM, please contact night-coverage.  [facility] contact information found on [email or web address].

## 2023-11-07 NOTE — Plan of Care (Signed)

## 2023-11-08 DIAGNOSIS — L0390 Cellulitis, unspecified: Secondary | ICD-10-CM | POA: Diagnosis not present

## 2023-11-08 DIAGNOSIS — A419 Sepsis, unspecified organism: Secondary | ICD-10-CM | POA: Diagnosis not present

## 2023-11-08 LAB — RENAL FUNCTION PANEL
Albumin: 1.9 g/dL — ABNORMAL LOW (ref 3.5–5.0)
Anion gap: 14 (ref 5–15)
BUN: 17 mg/dL (ref 8–23)
CO2: 26 mmol/L (ref 22–32)
Calcium: 7.4 mg/dL — ABNORMAL LOW (ref 8.9–10.3)
Chloride: 97 mmol/L — ABNORMAL LOW (ref 98–111)
Creatinine, Ser: 1.28 mg/dL — ABNORMAL HIGH (ref 0.44–1.00)
GFR, Estimated: 47 mL/min — ABNORMAL LOW (ref >60)
Glucose, Bld: 102 mg/dL — ABNORMAL HIGH (ref 70–99)
Phosphorus: 2.3 mg/dL — ABNORMAL LOW (ref 2.5–4.6)
Potassium: 3.7 mmol/L (ref 3.5–5.1)
Sodium: 137 mmol/L (ref 135–145)

## 2023-11-08 LAB — MAGNESIUM: Magnesium: 1.9 mg/dL (ref 1.7–2.4)

## 2023-11-08 LAB — GLUCOSE, CAPILLARY
Glucose-Capillary: 77 mg/dL (ref 70–99)
Glucose-Capillary: 123 mg/dL — ABNORMAL HIGH (ref 70–99)
Glucose-Capillary: 83 mg/dL (ref 70–99)
Glucose-Capillary: 119 mg/dL — ABNORMAL HIGH (ref 70–99)

## 2023-11-08 MED ORDER — K PHOS MONO-SOD PHOS DI & MONO 155-852-130 MG PO TABS
250.0000 mg | ORAL_TABLET | Freq: Once | ORAL | Status: AC
  Administered 2023-11-08: 250 mg via ORAL
  Filled 2023-11-08: qty 1

## 2023-11-08 NOTE — Plan of Care (Signed)

## 2023-11-08 NOTE — Plan of Care (Signed)
  Problem: Activity: Goal: Risk for activity intolerance will decrease Outcome: Progressing   Problem: Coping: Goal: Level of anxiety will decrease Outcome: Progressing   Problem: Elimination: Goal: Will not experience complications related to bowel motility Outcome: Progressing

## 2023-11-08 NOTE — Progress Notes (Addendum)
 PROGRESS NOTE Tricia [name]    DOB: 12-01-1960, 63 y.o.  FMW:[id number]    Code Status: Full Code   DOA: 10/30/2023   LOS: 9  Brief hospital course  Tricia [name] is a 63 y.o. female with a PMH significant for anxiety, osteoarthritis, asthma, COPD, depression, type 2 diabetes mellitus, GERD, dyslipidemia, and HTN, who presented to the emergency room with acute onset of generalized weakness and bilateral lower extremity swelling after being found down after several hours.   ED Course: BP was 109/53 with heart rate of 114 and later BP was 96/52 and temperature was 99.8, respiratory rate was 22. Labs reveal hyponatremia 128 and hyperkalemia of 6 with a CO2 of 13 and glucose of 126, BUN of 42 and creatinine 3.64 calcium  of 7.8 and albumin  2 with total protein 5.7.  BNP was 78.5 and high-sensitivity troponin I was 14.  Lactic acid was 2.6 and CBC showed hemoglobin 11.8 and hematocrit 36.9.  Respiratory panel came back negative.  Blood cultures were drawn. EKG: sinus tachycardia with rate 118 with poor R wave progression. Imaging: Portable chest x-ray showed emphysema with no acute cardiopulmonary disease. The patient was given IV vancomycin  and  IV cefepime .  On initial evaluation- patient was lethargic and minimally responsive. BP low and not responding to IV fluids or increase in midodrine . CCM was consulted who took over care for patient who required vasopressors. Weaned from them 9/14. NG tube was placed for nutrition as she was unable to tolerate PO while incapacitated.  9/15: transferred back to [facility]  11/08/23 -patient BP stable, will mean midodrine . PO intake is low but consistent, will dc NG tube and add PO supplements. Renal function improved- remove tunneled cath as she is not likely to need HD. Respiratory status- improved back to baseline O2 use. Mobility is greatly reduced- she likely needs SNF but needs to be reevaluated by PT/   Assessment & Plan  Principal Problem:    Sepsis due to cellulitis [facility]) Active Problems:   AKI (acute kidney injury) (HCC)   Hyperkalemia   Asthma, chronic   Hyponatremia   Parkinson's disease (HCC)   GERD without esophagitis   Dyslipidemia   Gout   Sepsis (HCC)   Leg swelling  Septic shockdue to cellulitis of lower extremities- Resolved Serratia bacteremia manifested by tachycardia and hypotension, elevated LA, renal failure.Weaned off pressors 9/14.  On midodrine  at home 5 mg 3 times daily.  Will continue to wean as able - Continue antibiotic therapy with IV ceftriaxone , will transition to PO in the AM  - Wound care consult - PT/OT and dispo planning. Patient amenable to SNF, as her son-in-law is unable to adequately care for  Severe calorie malnutrition  Patient tolerating PO well.  - Continue meal supplements -Have speech re-eval patient  Chronic hypoxic respiratory failure- on 2L [place] chronically at home. Remains on 2L currently  - continue nebs   Depression- continue home duloxetine , seroquel     AKI  Acute renal failure- Cr significantly increased from baseline up to 3.64 on presentation. unclear etiology Likely due to hemodynamics.  Stable this a.m. Continue to avoid nephrotoxic agents  Diabetes- diet controlled. A1c 5.1.   Normocytic anemia  Will continue to check periodically.  Has been stable.  Will check Fe levels.   Thrombocytopenia  Resolved. Nadir 57k. Unclear etiology. Likely due to infection. Lovenox  also discontinued.  Will resume non heparin  VTE ppx   Hyponatremia  Hyperkalemia- Supplement potassium    Asthma, chronic -  Not in exacerbation. continue theophylline , Singulair , and bronchodilator inhaler.   GERD without esophagitis -continue PPI therapy.   Parkinson's disease (HCC) - continue Sinemet  CR and Sinemet  IR.   Dyslipidemia- not on statin   Gout- not acutely exacerbated  Peripheral neuralgia - continue gabapentin   Vitamin D  deficiency  Continue vit D supplementation    Body mass index is 22.89 kg/m.  VTE ppx:   Diet:     Diet   DIET DYS 3 Room service appropriate? Yes with Assist; Fluid consistency: Thin   Consultants: CCM Nephrology   Subjective 11/08/23    No issues overnight.   Objective  Blood pressure (!) 95/50, pulse (!) 109, temperature 99.5 F (37.5 C), temperature source Axillary, resp. rate 14, height 5' 8 (1.727 m), weight 70.3 kg, SpO2 100%. No intake or output data in the 24 hours ending 11/08/23 1912  Filed Weights   11/06/23 0500 11/07/23 0245 11/08/23 0517  Weight: 77.2 kg 77.2 kg 68.3 kg     Constitutional: In no distress.  Cardiovascular: Normal rate, regular rhythm. No lower extremity edema  Pulmonary: Non labored breathing on room air, no wheezing or rales.   Abdominal: Soft. Non distended and non tender Musculoskeletal: Normal range of motion.     Neurological: Alert and oriented to person, place, and time.  Skin: BLE with bandages, hyperpigmentation, some mild increased WTT of RLE.    Labs   I have personally reviewed the following labs and imaging studies CBC    Component Value Date/Time   WBC 8.8 11/07/2023 0452   RBC 2.43 (L) 11/07/2023 0452   HGB 7.6 (L) 11/07/2023 0452   HGB 13.7 11/16/2013 2040   HCT 23.1 (L) 11/07/2023 0452   HCT 40.9 11/16/2013 2040   PLT 223 11/07/2023 0452   PLT 254 11/16/2013 2040   MCV 95.1 11/07/2023 0452   MCV 94 11/16/2013 2040   MCH 31.3 11/07/2023 0452   MCHC 32.9 11/07/2023 0452   RDW 14.6 11/07/2023 0452   RDW 14.0 11/16/2013 2040   LYMPHSABS 0.4 (L) 11/02/2023 1215   LYMPHSABS 2.7 11/16/2013 2040   MONOABS 0.2 11/02/2023 1215   MONOABS 0.6 11/16/2013 2040   EOSABS 0.0 11/02/2023 1215   EOSABS 0.1 11/16/2013 2040   BASOSABS 0.0 11/02/2023 1215   BASOSABS 0.1 11/16/2013 2040   BASOSABS 0 11/15/2011 1430      Latest Ref Rng & Units 11/08/2023    7:09 AM 11/07/2023    4:52 AM 11/06/2023    4:12 AM  BMP  Glucose 70 - 99 mg/dL 897  81  85   BUN 8 - 23  mg/dL 17  20  24    Creatinine 0.44 - 1.00 mg/dL 8.71  8.73  8.58   Sodium 135 - 145 mmol/L 137  135  137   Potassium 3.5 - 5.1 mmol/L 3.7  3.3  4.1   Chloride 98 - 111 mmol/L 97  100  97   CO2 22 - 32 mmol/L 26  28  31    Calcium  8.9 - 10.3 mg/dL 7.4  7.4  7.7    No results found.  Disposition Plan & Communication  Patient status: Inpatient  Admitted From: Home Planned disposition location: SNF Anticipated discharge date: TBD pending clinical development   Family Communication: None at bedside    Author: Alban Pepper, MD [facility] 11/08/2023, 7:12 PM   Available by Epic secure chat 7AM-7PM. If 7PM-7AM, please contact night-coverage.  [facility] contact information found on [email or web address].

## 2023-11-08 NOTE — Progress Notes (Signed)
 Occupational Therapy Treatment Patient Details Name: Tricia [name] MRN: [id number] DOB: [birth year] Today's Date: 11/08/2023   History of present illness Pt is a 63 y.o. female with medical history significant of COPD, prediabetes, hyperlipidemia, GERD, migraine headaches, anxiety, depression, benign essential tremor that presented to ED for BLE swelling, weakness. Workup for cellulitis of BLE, AKI, admission complicated by septic shock and acute COPD exacerbation.   OT comments  Pt seen for OT treatment on this date. Upon arrival to room pt semi supine in bed, agreeable to tx. Pt requires MAXA to come to the EOB, MODA to return post mobility attempts. Pt completed 2 standing attempts from slightly elevated bed height, however pt was unable to come to full standing position 2/2 attempts due to reported neuropathy pain in LLE. Pt able to have minimal bed clearance on 2nd attempt to complete pericare as pt had BM and was unaware of soiled linens. Pt required setupA for seated face washing with noted improved sitting tolerance. Pt returned to bed in a sidelying position. RN aware of pt pain levels, in room to assess. Pt making fair progress toward goals, will continue to follow POC. Discharge recommendation remains appropriate.        If plan is discharge home, recommend the following:  Two people to help with walking and/or transfers;Two people to help with bathing/dressing/bathroom;Assistance with cooking/housework;Assist for transportation;Help with stairs or ramp for entrance;Supervision due to cognitive status;Direct supervision/assist for medications management   Equipment Recommendations  Other (comment)    Recommendations for Other Services      Precautions / Restrictions Precautions Precautions: Fall Recall of Precautions/Restrictions: Impaired Restrictions Weight Bearing Restrictions Per Provider Order: No       Mobility Bed Mobility Overal bed mobility: Needs  Assistance Bed Mobility: Supine to Sit, Sit to Supine, Sidelying to Sit, Rolling Rolling: Mod assist, Used rails Sidelying to sit: Max assist, HOB elevated, Used rails     Sit to sidelying: Mod assist General bed mobility comments: Pt required verbal/tactile cues to complete bed mobility    Transfers Overall transfer level: Needs assistance Equipment used: Rolling walker (2 wheels) Transfers: Sit to/from Stand Sit to Stand: Max assist, +2 physical assistance           General transfer comment: Multiple standing attempts, pt unable to come to full standing position, minimal bed clearance on either attemtps     Balance Overall balance assessment: Needs assistance Sitting-balance support: Feet supported, No upper extremity supported Sitting balance-Leahy Scale: Fair Sitting balance - Comments: improved sitting tolerance on this date   Standing balance support: Bilateral upper extremity supported, Reliant on assistive device for balance Standing balance-Leahy Scale: Poor Standing balance comment: Pt remains heavy reliant on RW to assist in off loading RLE                           ADL either performed or assessed with clinical judgement   ADL Overall ADL's : Needs assistance/impaired     Grooming: Wash/dry face;Wash/dry hands;Sitting;Set up               Lower Body Dressing: Maximal assistance;Sitting/lateral leans       Toileting- Clothing Manipulation and Hygiene: Maximal assistance;+2 for safety/equipment;Bed level;Sit to/from stand         General ADL Comments: Attempted standing pericare, pt unable to tolerate bearing weight through LEs, returned to bed level to get clean    Extremity/Trunk Assessment  Vision       Perception     Praxis     Communication Communication Communication: Impaired Factors Affecting Communication: Difficulty expressing self   Cognition Arousal: Alert Behavior During Therapy: WFL for tasks  assessed/performed Cognition: No family/caregiver present to determine baseline                               Following commands: Impaired Following commands impaired: Follows one step commands with increased time      Cueing   Cueing Techniques: Verbal cues, Gestural cues, Tactile cues  Exercises Exercises: Other exercises Other Exercises Other Exercises: Edu: Role of OT treatment, safe ADL completion    Shoulder Instructions       General Comments Pt on RA throughout session, spo2 levels WNLs    Pertinent Vitals/ Pain       Pain Assessment Pain Assessment: Faces Faces Pain Scale: Hurts even more Pain Location: B feet Pain Descriptors / Indicators: Discomfort, Grimacing, Guarding, Sharp, Moaning Pain Intervention(s): Limited activity within patient's tolerance, Monitored during session, Repositioned, Premedicated before session  Home Living                                          Prior Functioning/Environment              Frequency  Min 2X/week        Progress Toward Goals  OT Goals(current goals can now be found in the care plan section)  Progress towards OT goals: Progressing toward goals  Acute Rehab OT Goals OT Goal Formulation: With patient Time For Goal Achievement: 11/16/23 Potential to Achieve Goals: Good ADL Goals Pt Will Perform Grooming: sitting;with set-up;with supervision Pt Will Perform Lower Body Dressing: with min assist;sit to/from stand;sitting/lateral leans;with adaptive equipment Pt Will Transfer to Toilet: ambulating;bedside commode;with contact guard assist  Plan      Co-evaluation    PT/OT/SLP Co-Evaluation/Treatment: Yes Reason for Co-Treatment: Complexity of the patient's impairments (multi-system involvement);For patient/therapist safety PT goals addressed during session: Mobility/safety with mobility;Proper use of DME;Strengthening/ROM OT goals addressed during session: ADL's and  self-care;Proper use of Adaptive equipment and DME      AM-PAC OT 6 Clicks Daily Activity     Outcome Measure   Help from another person eating meals?: A Little Help from another person taking care of personal grooming?: A Lot Help from another person toileting, which includes using toliet, bedpan, or urinal?: A Lot Help from another person bathing (including washing, rinsing, drying)?: A Lot Help from another person to put on and taking off regular upper body clothing?: A Lot Help from another person to put on and taking off regular lower body clothing?: A Lot 6 Click Score: 13    End of Session Equipment Utilized During Treatment: Rolling walker (2 wheels);Gait belt  OT Visit Diagnosis: Other abnormalities of gait and mobility (R26.89);Muscle weakness (generalized) (M62.81);Pain Pain - Right/Left: Right Pain - part of body: Knee;Leg;Ankle and joints of foot   Activity Tolerance Patient tolerated treatment well   Patient Left with call bell/phone within reach;in bed;with bed alarm set   Nurse Communication Mobility status        Time: 8898-8866 OT Time Calculation (min): 32 min  Charges: OT General Charges $OT Visit: 1 Visit OT Treatments $Self Care/Home Management : 8-22 mins  Larraine Colas M.S. OTR/L  11/08/23, 3:00 PM

## 2023-11-08 NOTE — Progress Notes (Signed)
 Physical Therapy Treatment Patient Details Name: Tricia [name] MRN: [id number] DOB: May 26, 1960 Today's Date: 11/08/2023   History of Present Illness Pt is a 63 y.o. female with medical history significant of COPD, prediabetes, hyperlipidemia, GERD, migraine headaches, anxiety, depression, benign essential tremor that presented to ED for BLE swelling, weakness. Workup for cellulitis of BLE, AKI, admission complicated by septic shock and acute COPD exacerbation.    PT Comments  Co-treat with OT 2/2 Complexity of the patient's impairments; For patient/therapist safety.  Pt's tolerance to mobility training was very limited today 2/2 severe pain in bil feet;  pt was premedicated for pain.   Pt unable to come up into a full stand with RW, Max A +2 due to severe pain on bil. feet. Multiple sit<>stand attempts made for hygiene 2/2 pt having a BM but pt had to be assist back into bed for full cleanup 2/2 pt not being able to fully stand.  Initial recs for STR at d/c remain appropriate.    If plan is discharge home, recommend the following: Two people to help with walking and/or transfers;Two people to help with bathing/dressing/bathroom;Help with stairs or ramp for entrance;Assist for transportation;Assistance with feeding;Assistance with cooking/housework   Can travel by private vehicle     No  Equipment Recommendations   (TBD)    Recommendations for Other Services       Precautions / Restrictions Precautions Precautions: Fall Recall of Precautions/Restrictions: Impaired Restrictions Weight Bearing Restrictions Per Provider Order: No     Mobility  Bed Mobility Overal bed mobility: Needs Assistance Bed Mobility: Supine to Sit, Sit to Supine, Sidelying to Sit, Rolling Rolling: Mod assist, Used rails Sidelying to sit: Max assist, HOB elevated, Used rails     Sit to sidelying: Mod assist      Transfers Overall transfer level: Needs assistance Equipment used: Rolling walker (2  wheels) Transfers: Sit to/from Stand Sit to Stand: Max assist, +2 physical assistance           General transfer comment: Pt unable to come up into a full stand 2/2 severe pain on bil. feet.  Multiple attempts made for hygiene 2/2 pt having a BM but pt had to be assist back into bed for full cleanup 2/2 pt not being able to fully stand.    Ambulation/Gait               General Gait Details: unable 2/2 pain in bil feet.   Stairs             Wheelchair Mobility     Tilt Bed    Modified Rankin (Stroke Patients Only)       Balance Overall balance assessment: Needs assistance   Sitting balance-Leahy Scale: Fair Sitting balance - Comments: Limited WB through RLE per pain reported from pt   Standing balance support: Bilateral upper extremity supported, Reliant on assistive device for balance Standing balance-Leahy Scale: Poor Standing balance comment: Heavy reliant on RW in standing to off weight RLE                            Communication Communication Communication: Impaired Factors Affecting Communication: Difficulty expressing self  Cognition Arousal: Alert Behavior During Therapy: WFL for tasks assessed/performed   PT - Cognitive impairments: No family/caregiver present to determine baseline                         Following commands: Impaired  Following commands impaired: Follows one step commands with increased time    Cueing    Exercises      General Comments        Pertinent Vitals/Pain Pain Assessment Pain Assessment: Faces Faces Pain Scale: Hurts whole lot Pain Location: B feet Pain Descriptors / Indicators: Discomfort, Grimacing, Guarding, Sharp, Moaning Pain Intervention(s): Premedicated before session, Monitored during session, Repositioned    Home Living                          Prior Function            PT Goals (current goals can now be found in the care plan section) Acute Rehab PT  Goals Patient Stated Goal: to have less pain PT Goal Formulation: With patient Time For Goal Achievement: 11/16/23 Potential to Achieve Goals: Good Progress towards PT goals: Progressing toward goals    Frequency    Min 2X/week      PT Plan      Co-evaluation PT/OT/SLP Co-Evaluation/Treatment: Yes Reason for Co-Treatment: Complexity of the patient's impairments (multi-system involvement);For patient/therapist safety PT goals addressed during session: Mobility/safety with mobility;Proper use of DME;Strengthening/ROM        AM-PAC PT 6 Clicks Mobility   Outcome Measure  Help needed turning from your back to your side while in a flat bed without using bedrails?: A Lot Help needed moving from lying on your back to sitting on the side of a flat bed without using bedrails?: A Lot Help needed moving to and from a bed to a chair (including a wheelchair)?: Total Help needed standing up from a chair using your arms (e.g., wheelchair or bedside chair)?: Total Help needed to walk in hospital room?: Total Help needed climbing 3-5 steps with a railing? : Total 6 Click Score: 8    End of Session Equipment Utilized During Treatment: Gait belt Activity Tolerance: Patient limited by fatigue;Patient limited by pain Patient left: in bed;with call bell/phone within reach;with nursing/sitter in room Nurse Communication: Mobility status;Other (comment) PT Visit Diagnosis: Other abnormalities of gait and mobility (R26.89);Difficulty in walking, not elsewhere classified (R26.2);Muscle weakness (generalized) (M62.81)     Time: 8899-8871 PT Time Calculation (min) (ACUTE ONLY): 28 min  Charges:    $Therapeutic Activity: 8-22 mins (co-treat) PT General Charges $$ ACUTE PT VISIT: 1 Visit                    Harland Irving, PTA  11/08/23, 12:47 PM

## 2023-11-08 NOTE — Consult Note (Signed)
 PHARMACY CONSULT NOTE - ELECTROLYTES  Pharmacy Consult for Electrolyte Monitoring and Replacement   Recent Labs: Height: 5' 8 (172.7 cm) Weight: 68.3 kg (150 lb 9.2 oz) IBW/kg (Calculated) : 63.9 Estimated Creatinine Clearance: 45.4 mL/min (A) (by C-G formula based on SCr of 1.28 mg/dL (H)). Potassium (mmol/L)  Date Value  11/08/2023 3.7  11/16/2013 4.0   Magnesium  (mg/dL)  Date Value  90/80/7974 1.9   Calcium  (mg/dL)  Date Value  90/80/7974 7.4 (L)   Calcium , Total (mg/dL)  Date Value  90/71/7984 7.9 (L)   Albumin  (g/dL)  Date Value  90/80/7974 1.9 (L)  07/06/2015 4.2  10/07/2012 2.9 (L)   Phosphorus (mg/dL)  Date Value  90/80/7974 2.3 (L)   Sodium (mmol/L)  Date Value  11/08/2023 137  07/06/2015 146 (H)  11/16/2013 131 (L)   Corrected Ca: 9.2 mg/dL  Assessment  Tricia [name] is a 63 y.o. female presenting with weakness and leg swelling. PMH significant for anxiety, osteoarthritis, asthma, COPD, depression, type 2 diabetes mellitus, GERD, dyslipidemia, and hypertension. Pharmacy has been consulted to monitor and replace electrolytes.  Diet: dysphagia 3 diet MIVF: N/A Pertinent medications: N/A  Goal of Therapy: Electrolytes WNL  Plan:  Phos 2.3: Give Kphos neutral 250 mg PO x1 Check BMP, Mg, and Phos with AM labs  Thank you for involving pharmacy in this patient's care.   Damien Napoleon, PharmD Clinical Pharmacist 11/08/2023 8:28 AM

## 2023-11-08 NOTE — TOC Progression Note (Signed)
 Transition of Care [facility]) - Progression Note    Patient Details  Name: Tricia [name] MRN: [id number] Date of Birth: 08-18-1960  Transition of [facility]) CM/SW Contact  Alvaro Louder, [place] Phone Number: 11/08/2023, 10:06 AM  Clinical Narrative:  [name] approved for patient to admit to [facility] when medically ready. [facility] indicated that they would not a bed until Monday.   [id number]:[id number] Dates:9/18-9/22/2025 Next Review Date: 11/11/2023    [facility] to follow for discharge                    Expected Discharge Plan and Services                                               Social Drivers of Health (SDOH) Interventions SDOH Screenings   Food Insecurity: Patient Unable To Answer (10/31/2023)  Housing: Unknown (10/31/2023)  Transportation Needs: Patient Unable To Answer (10/31/2023)  Utilities: Patient Unable To Answer (10/31/2023)  Alcohol Screen: Low Risk  (03/07/2023)  Depression (PHQ2-9): High Risk (05/01/2023)  Financial Resource Strain: Patient Declined (06/18/2023)   Received from [facility]  Physical Activity: Sufficiently Active (03/07/2023)  Social Connections: Socially Isolated (08/29/2023)  Stress: No Stress Concern Present (03/07/2023)  Tobacco Use: High Risk (10/30/2023)  Health Literacy: Adequate Health Literacy (03/07/2023)    Readmission Risk Interventions    11/02/2023    1:21 PM  Readmission Risk Prevention Plan  Transportation Screening Complete  Medication Review (RN Care Manager) Complete  PCP or Specialist appointment within 3-5 days of discharge Complete  SW Recovery Care/Counseling Consult Complete  Palliative Care Screening Not Applicable  Skilled Nursing Facility Not Applicable

## 2023-11-09 DIAGNOSIS — A419 Sepsis, unspecified organism: Secondary | ICD-10-CM | POA: Diagnosis not present

## 2023-11-09 DIAGNOSIS — L0390 Cellulitis, unspecified: Secondary | ICD-10-CM | POA: Diagnosis not present

## 2023-11-09 LAB — RENAL FUNCTION PANEL
Albumin: 1.8 g/dL — ABNORMAL LOW (ref 3.5–5.0)
Anion gap: 10 (ref 5–15)
BUN: 14 mg/dL (ref 8–23)
CO2: 26 mmol/L (ref 22–32)
Calcium: 7 mg/dL — ABNORMAL LOW (ref 8.9–10.3)
Chloride: 98 mmol/L (ref 98–111)
Creatinine, Ser: 1.08 mg/dL — ABNORMAL HIGH (ref 0.44–1.00)
GFR, Estimated: 58 mL/min — ABNORMAL LOW (ref >60)
Glucose, Bld: 87 mg/dL (ref 70–99)
Phosphorus: 2.7 mg/dL (ref 2.5–4.6)
Potassium: 3.5 mmol/L (ref 3.5–5.1)
Sodium: 134 mmol/L — ABNORMAL LOW (ref 135–145)

## 2023-11-09 LAB — GLUCOSE, CAPILLARY
Glucose-Capillary: 84 mg/dL (ref 70–99)
Glucose-Capillary: 118 mg/dL — ABNORMAL HIGH (ref 70–99)
Glucose-Capillary: 148 mg/dL — ABNORMAL HIGH (ref 70–99)
Glucose-Capillary: 188 mg/dL — ABNORMAL HIGH (ref 70–99)

## 2023-11-09 LAB — CBC
HCT: 22.6 % — ABNORMAL LOW (ref 36.0–46.0)
Hemoglobin: 7.5 g/dL — ABNORMAL LOW (ref 12.0–15.0)
MCH: 31.6 pg (ref 26.0–34.0)
MCHC: 33.2 g/dL (ref 30.0–36.0)
MCV: 95.4 fL (ref 80.0–100.0)
Platelets: 352 K/uL (ref 150–400)
RBC: 2.37 MIL/uL — ABNORMAL LOW (ref 3.87–5.11)
RDW: 15 % (ref 11.5–15.5)
WBC: 9.9 K/uL (ref 4.0–10.5)
nRBC: 0 % (ref 0.0–0.2)

## 2023-11-09 LAB — FERRITIN: Ferritin: 255 ng/mL (ref 11–307)

## 2023-11-09 LAB — IRON AND TIBC: Iron: 32 ug/dL (ref 28–170)

## 2023-11-09 LAB — MAGNESIUM: Magnesium: 1.7 mg/dL (ref 1.7–2.4)

## 2023-11-09 MED ORDER — ENOXAPARIN SODIUM 40 MG/0.4ML IJ SOSY
40.0000 mg | PREFILLED_SYRINGE | INTRAMUSCULAR | Status: DC
  Administered 2023-11-09 – 2023-11-17 (×9): 40 mg via SUBCUTANEOUS
  Filled 2023-11-09 (×9): qty 0.4

## 2023-11-09 MED ORDER — IPRATROPIUM-ALBUTEROL 0.5-2.5 (3) MG/3ML IN SOLN
3.0000 mL | Freq: Once | RESPIRATORY_TRACT | Status: AC
  Administered 2023-11-09: 3 mL via RESPIRATORY_TRACT
  Filled 2023-11-09: qty 3

## 2023-11-09 MED ORDER — HYDROCOD POLI-CHLORPHE POLI ER 10-8 MG/5ML PO SUER
5.0000 mL | Freq: Every evening | ORAL | Status: DC | PRN
Start: 2023-11-09 — End: 2023-11-17

## 2023-11-09 MED ORDER — MIDODRINE HCL 5 MG PO TABS
2.5000 mg | ORAL_TABLET | Freq: Two times a day (BID) | ORAL | Status: DC
Start: 2023-11-10 — End: 2023-11-10
  Administered 2023-11-10 (×2): 2.5 mg via ORAL
  Filled 2023-11-09 (×2): qty 1

## 2023-11-09 NOTE — Consult Note (Signed)
 PHARMACY CONSULT NOTE - ELECTROLYTES  Pharmacy Consult for Electrolyte Monitoring and Replacement   Recent Labs: Height: 5' 8 (172.7 cm) Weight: 68.3 kg (150 lb 9.2 oz) IBW/kg (Calculated) : 63.9 Estimated Creatinine Clearance: 53.8 mL/min (A) (by C-G formula based on SCr of 1.08 mg/dL (H)). Potassium (mmol/L)  Date Value  11/09/2023 3.5  11/16/2013 4.0   Magnesium  (mg/dL)  Date Value  90/80/7974 1.9   Calcium  (mg/dL)  Date Value  90/79/7974 7.0 (L)   Calcium , Total (mg/dL)  Date Value  90/71/7984 7.9 (L)   Albumin  (g/dL)  Date Value  90/79/7974 1.8 (L)  07/06/2015 4.2  10/07/2012 2.9 (L)   Phosphorus (mg/dL)  Date Value  90/79/7974 2.7   Sodium (mmol/L)  Date Value  11/09/2023 134 (L)  07/06/2015 146 (H)  11/16/2013 131 (L)   Corrected Ca: 9.2 mg/dL  Assessment  Tricia [name] is a 63 y.o. female presenting with weakness and leg swelling. PMH significant for anxiety, osteoarthritis, asthma, COPD, depression, type 2 diabetes mellitus, GERD, dyslipidemia, and hypertension. Pharmacy has been consulted to monitor and replace electrolytes.  Diet: dysphagia 3 diet MIVF: N/A Pertinent medications: N/A  Goal of Therapy: Electrolytes WNL  Plan:  No replacement needed F/u with AM labs.   Thank you for involving pharmacy in this patient's care.   Cathaleen Blanch, PharmD, BCPS Clinical Pharmacist 11/09/2023 7:18 AM

## 2023-11-09 NOTE — Plan of Care (Signed)
  Problem: Fluid Volume: Goal: Hemodynamic stability will improve Outcome: Progressing   Problem: Respiratory: Goal: Ability to maintain adequate ventilation will improve Outcome: Progressing   Problem: Coping: Goal: Ability to adjust to condition or change in health will improve Outcome: Progressing

## 2023-11-09 NOTE — Progress Notes (Signed)
 PROGRESS NOTE Tricia [name]    DOB: 1961/01/06, 63 y.o.  FMW:[id number]    Code Status: Full Code   DOA: 10/30/2023   LOS: 10  Brief hospital course  Tricia [name] is a 63 y.o. female with a PMH significant for anxiety, osteoarthritis, asthma, COPD, depression, type 2 diabetes mellitus, GERD, dyslipidemia, and HTN, who presented to the emergency room with acute onset of generalized weakness and bilateral lower extremity swelling after being found down after several hours.   ED Course: BP was 109/53 with heart rate of 114 and later BP was 96/52 and temperature was 99.8, respiratory rate was 22. Labs reveal hyponatremia 128 and hyperkalemia of 6 with a CO2 of 13 and glucose of 126, BUN of 42 and creatinine 3.64 calcium  of 7.8 and albumin  2 with total protein 5.7.  BNP was 78.5 and high-sensitivity troponin I was 14.  Lactic acid was 2.6 and CBC showed hemoglobin 11.8 and hematocrit 36.9.  Respiratory panel came back negative.  Blood cultures were drawn. EKG: sinus tachycardia with rate 118 with poor R wave progression. Imaging: Portable chest x-ray showed emphysema with no acute cardiopulmonary disease. The patient was given IV vancomycin  and  IV cefepime .  On initial evaluation- patient was lethargic and minimally responsive. BP low and not responding to IV fluids or increase in midodrine . CCM was consulted who took over care for patient who required vasopressors. Weaned from them 9/14. NG tube was placed for nutrition as she was unable to tolerate PO while incapacitated.  9/15: transferred back to [facility]  11/09/23 -patient BP stable, will mean midodrine . PO intake is low but consistent, will dc NG tube and add PO supplements. Renal function improved- remove tunneled cath as she is not likely to need HD. Respiratory status- improved back to baseline O2 use. Mobility is greatly reduced- she likely needs SNF but needs to be reevaluated by PT/   Assessment & Plan  Principal Problem:    Sepsis due to cellulitis [facility]) Active Problems:   AKI (acute kidney injury) (HCC)   Hyperkalemia   Asthma, chronic   Hyponatremia   Parkinson's disease (HCC)   GERD without esophagitis   Dyslipidemia   Gout   Sepsis (HCC)   Leg swelling  Septic shockdue to cellulitis of lower extremities- Resolved Serratia bacteremia manifested by tachycardia and hypotension, elevated LA, renal failure.Weaned off pressors 9/14.  On midodrine  at home 5 mg 3 times daily after recent hospitalization 09/2023 - Completed appropriate dose of antibiotics.  --Wean midodrine  to 2.5mg  BID with meals  - Wound care  - PT/Otawaiting SNF   Severe calorie malnutrition  Patient tolerating PO well.  - Continue meal supplements -Have speech re-eval patient  Chronic hypoxic respiratory failure- on 2L [place] chronically at home. Remains on 2L currently  - continue nebs   Depression- continue home duloxetine , seroquel     AKI  Acute renal failure Resolving- Cr significantly increased from baseline up to 3.64 on presentation. unclear etiology Likely due to hemodynamics. (Hypotension) Continues to improve Continue to avoid nephrotoxic agents  Diabetes- diet controlled. A1c 5.1.   Normocytic anemia  Will continue to check periodically.  Ferritin 255 B12 nl 1 month ago   Thrombocytopenia  Resolved. Nadir 57k. Unclear etiology. Likely due to infection.  Lovenox  was resumed  Hyponatremia  Hyperkalemia- Supplement potassium     Latest Ref Rng & Units 11/09/2023    4:22 AM 11/08/2023    7:09 AM 11/07/2023    4:52 AM  BMP  Glucose 70 - 99 mg/dL 87  897  81   BUN 8 - 23 mg/dL 14  17  20    Creatinine 0.44 - 1.00 mg/dL 8.91  8.71  8.73   Sodium 135 - 145 mmol/L 134  137  135   Potassium 3.5 - 5.1 mmol/L 3.5  3.7  3.3   Chloride 98 - 111 mmol/L 98  97  100   CO2 22 - 32 mmol/L 26  26  28    Calcium  8.9 - 10.3 mg/dL 7.0  7.4  7.4     Asthma, chronic - Not in exacerbation. continue theophylline , Singulair , and  bronchodilator inhaler.   GERD without esophagitis -continue PPI therapy.   Parkinson's disease (HCC) - continue Sinemet  CR and Sinemet  IR.   Dyslipidemia- not on statin   Gout- not acutely exacerbated  Peripheral neuralgia - continue gabapentin   Vitamin D  deficiency  Continue vit D supplementation   Body mass index is 22.96 kg/m.  VTE ppx: enoxaparin  (LOVENOX ) injection 40 mg Start: 11/09/23 1000  Diet:     Diet   DIET DYS 3 Room service appropriate? Yes with Assist; Fluid consistency: Thin   Consultants: CCM Nephrology   Subjective 11/09/23    Mild productive cough. No other issues.    Objective  Blood pressure (!) 95/50, pulse (!) 109, temperature 99.5 F (37.5 C), temperature source Axillary, resp. rate 14, height 5' 8 (1.727 m), weight 70.3 kg, SpO2 100%.  Intake/Output Summary (Last 24 hours) at 11/09/2023 1902 Last data filed at 11/09/2023 0900 Gross per 24 hour  Intake 120 ml  Output --  Net 120 ml    Filed Weights   11/07/23 0245 11/08/23 0517 11/09/23 0718  Weight: 77.2 kg 68.3 kg 68.5 kg      Constitutional: In no distress.  Cardiovascular: Normal rate, regular rhythm. No lower extremity edema  Pulmonary: Non labored breathing on room air, mild wheezing no rales.   Abdominal: Soft. Non distended and non tender Musculoskeletal: Normal range of motion.     Neurological: Alert and oriented to person, place, and time. Non focal  Skin: BLE with bandages, hyperpigmentation, WTT same on both sides, no increased erytehma    Labs   I have personally reviewed the following labs and imaging studies CBC    Component Value Date/Time   WBC 9.9 11/09/2023 0422   RBC 2.37 (L) 11/09/2023 0422   HGB 7.5 (L) 11/09/2023 0422   HGB 13.7 11/16/2013 2040   HCT 22.6 (L) 11/09/2023 0422   HCT 40.9 11/16/2013 2040   PLT 352 11/09/2023 0422   PLT 254 11/16/2013 2040   MCV 95.4 11/09/2023 0422   MCV 94 11/16/2013 2040   MCH 31.6 11/09/2023 0422   MCHC 33.2  11/09/2023 0422   RDW 15.0 11/09/2023 0422   RDW 14.0 11/16/2013 2040   LYMPHSABS 0.4 (L) 11/02/2023 1215   LYMPHSABS 2.7 11/16/2013 2040   MONOABS 0.2 11/02/2023 1215   MONOABS 0.6 11/16/2013 2040   EOSABS 0.0 11/02/2023 1215   EOSABS 0.1 11/16/2013 2040   BASOSABS 0.0 11/02/2023 1215   BASOSABS 0.1 11/16/2013 2040   BASOSABS 0 11/15/2011 1430      Latest Ref Rng & Units 11/09/2023    4:22 AM 11/08/2023    7:09 AM 11/07/2023    4:52 AM  BMP  Glucose 70 - 99 mg/dL 87  897  81   BUN 8 - 23 mg/dL 14  17  20    Creatinine 0.44 - 1.00 mg/dL  1.08  1.28  1.26   Sodium 135 - 145 mmol/L 134  137  135   Potassium 3.5 - 5.1 mmol/L 3.5  3.7  3.3   Chloride 98 - 111 mmol/L 98  97  100   CO2 22 - 32 mmol/L 26  26  28    Calcium  8.9 - 10.3 mg/dL 7.0  7.4  7.4    No results found.  Disposition Plan & Communication  Patient status: Inpatient  Admitted From: Home Planned disposition location: SNF Anticipated discharge date: TBD pending clinical development   Family Communication: None at bedside    Author: Alban Pepper, MD [facility] 11/09/2023, 7:02 PM   Available by Epic secure chat 7AM-7PM. If 7PM-7AM, please contact night-coverage.  [facility] contact information found on [email or web address].

## 2023-11-10 ENCOUNTER — Inpatient Hospital Stay

## 2023-11-10 DIAGNOSIS — L0390 Cellulitis, unspecified: Secondary | ICD-10-CM | POA: Diagnosis not present

## 2023-11-10 DIAGNOSIS — R000 Tachycardia, unspecified: Secondary | ICD-10-CM

## 2023-11-10 DIAGNOSIS — A419 Sepsis, unspecified organism: Secondary | ICD-10-CM | POA: Diagnosis not present

## 2023-11-10 LAB — BRAIN NATRIURETIC PEPTIDE: B Natriuretic Peptide: 167.9 pg/mL — ABNORMAL HIGH (ref 0.0–100.0)

## 2023-11-10 LAB — CBC
HCT: 23 % — ABNORMAL LOW (ref 36.0–46.0)
Hemoglobin: 7.7 g/dL — ABNORMAL LOW (ref 12.0–15.0)
MCH: 32.1 pg (ref 26.0–34.0)
MCHC: 33.5 g/dL (ref 30.0–36.0)
MCV: 95.8 fL (ref 80.0–100.0)
Platelets: 416 K/uL — ABNORMAL HIGH (ref 150–400)
RBC: 2.4 MIL/uL — ABNORMAL LOW (ref 3.87–5.11)
RDW: 15.3 % (ref 11.5–15.5)
WBC: 9.6 K/uL (ref 4.0–10.5)
nRBC: 0 % (ref 0.0–0.2)

## 2023-11-10 LAB — BASIC METABOLIC PANEL WITH GFR
Anion gap: 7 (ref 5–15)
BUN: 14 mg/dL (ref 8–23)
CO2: 26 mmol/L (ref 22–32)
Calcium: 7.1 mg/dL — ABNORMAL LOW (ref 8.9–10.3)
Chloride: 102 mmol/L (ref 98–111)
Creatinine, Ser: 1.11 mg/dL — ABNORMAL HIGH (ref 0.44–1.00)
GFR, Estimated: 56 mL/min — ABNORMAL LOW (ref >60)
Glucose, Bld: 94 mg/dL (ref 70–99)
Potassium: 3.2 mmol/L — ABNORMAL LOW (ref 3.5–5.1)
Sodium: 135 mmol/L (ref 135–145)

## 2023-11-10 LAB — GLUCOSE, CAPILLARY
Glucose-Capillary: 146 mg/dL — ABNORMAL HIGH (ref 70–99)
Glucose-Capillary: 95 mg/dL (ref 70–99)

## 2023-11-10 LAB — D-DIMER, QUANTITATIVE: D-Dimer, Quant: 2.74 ug{FEU}/mL — ABNORMAL HIGH (ref 0.00–0.50)

## 2023-11-10 MED ORDER — ALBUTEROL SULFATE HFA 108 (90 BASE) MCG/ACT IN AERS: 2.0000 | INHALATION_SPRAY | RESPIRATORY_TRACT | Status: DC | PRN

## 2023-11-10 MED ORDER — TIOTROPIUM BROMIDE MONOHYDRATE 18 MCG IN CAPS: 1.0000 | ORAL_CAPSULE | Freq: Every day | RESPIRATORY_TRACT | Status: DC

## 2023-11-10 MED ORDER — POTASSIUM CHLORIDE 20 MEQ PO PACK
40.0000 meq | PACK | Freq: Once | ORAL | Status: AC
  Administered 2023-11-10: 40 meq via ORAL
  Filled 2023-11-10: qty 2

## 2023-11-10 MED ORDER — ALBUTEROL SULFATE (2.5 MG/3ML) 0.083% IN NEBU: 2.5000 mg | INHALATION_SOLUTION | RESPIRATORY_TRACT | Status: DC | PRN

## 2023-11-10 MED ORDER — IPRATROPIUM-ALBUTEROL 0.5-2.5 (3) MG/3ML IN SOLN
3.0000 mL | Freq: Four times a day (QID) | RESPIRATORY_TRACT | Status: DC | PRN
  Administered 2023-11-10 – 2023-11-11 (×2): 3 mL via RESPIRATORY_TRACT
  Filled 2023-11-10 (×2): qty 3

## 2023-11-10 MED ORDER — IOHEXOL 350 MG/ML SOLN
75.0000 mL | Freq: Once | INTRAVENOUS | Status: AC | PRN
  Administered 2023-11-10: 75 mL via INTRAVENOUS

## 2023-11-10 MED ORDER — LIDOCAINE 5 % EX PTCH
1.0000 | MEDICATED_PATCH | CUTANEOUS | Status: DC
  Administered 2023-11-10 – 2023-11-16 (×7): 1 via TRANSDERMAL
  Filled 2023-11-10 (×7): qty 1

## 2023-11-10 MED ORDER — UMECLIDINIUM BROMIDE 62.5 MCG/ACT IN AEPB
1.0000 | INHALATION_SPRAY | Freq: Every day | RESPIRATORY_TRACT | Status: DC
  Administered 2023-11-11 – 2023-11-17 (×7): 1 via RESPIRATORY_TRACT
  Filled 2023-11-10: qty 7

## 2023-11-10 MED ORDER — FLUTICASONE FUROATE-VILANTEROL 200-25 MCG/ACT IN AEPB
1.0000 | INHALATION_SPRAY | Freq: Every day | RESPIRATORY_TRACT | Status: DC
Start: 2023-11-10 — End: 2023-11-17
  Administered 2023-11-10 – 2023-11-17 (×8): 1 via RESPIRATORY_TRACT
  Filled 2023-11-10: qty 28

## 2023-11-10 MED ORDER — PANTOPRAZOLE SODIUM 40 MG PO TBEC
40.0000 mg | DELAYED_RELEASE_TABLET | Freq: Every day | ORAL | Status: DC
  Administered 2023-11-10 – 2023-11-17 (×8): 40 mg via ORAL
  Filled 2023-11-10 (×8): qty 1

## 2023-11-10 MED ORDER — MAGNESIUM SULFATE 2 GM/50ML IV SOLN
2.0000 g | Freq: Once | INTRAVENOUS | Status: AC
Start: 2023-11-10 — End: 2023-11-10
  Administered 2023-11-10: 2 g via INTRAVENOUS
  Filled 2023-11-10: qty 50

## 2023-11-10 MED ORDER — MIDODRINE HCL 5 MG PO TABS
2.5000 mg | ORAL_TABLET | Freq: Every day | ORAL | Status: DC
Start: 2023-11-11 — End: 2023-11-12
  Administered 2023-11-11: 2.5 mg via ORAL
  Filled 2023-11-10: qty 1

## 2023-11-10 NOTE — Evaluation (Signed)
 Clinical/Bedside Swallow Evaluation Patient Details  Name: Tricia [name] MRN: [id number] Date of Birth: 27-Oct-1960  Today's Date: 11/10/2023 Time: SLP Start Time (ACUTE ONLY): 1005 SLP Stop Time (ACUTE ONLY): 1021 SLP Time Calculation (min) (ACUTE ONLY): 16 min  Past Medical History:  Past Medical History:  Diagnosis Date   Anxiety    Arthritis    joints and hands/ knees   Asthma    uses inhaler   Benign essential tremor    head   Cervical dystonia    neck pain   Cholesteatoma of left ear    x2   COPD (chronic obstructive pulmonary disease) (HCC)    Cough    Depression    Diabetes mellitus without complication (HCC)    type 2   Diastolic dysfunction    Dyspnea    Dysrhythmia    diastolic dysfunction   GERD (gastroesophageal reflux disease)    Headache    migraines/ one per week   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   Hyperlipidemia    Hypertension    Motion sickness    boat   Neuromuscular disorder (HCC)    neuropathy feet and hands( nerve damage)   Wears dentures    upper and lower   Past Surgical History:  Past Surgical History:  Procedure Laterality Date   CARPAL TUNNEL RELEASE Bilateral    x2 right, 1x on left   COLONOSCOPY     COLONOSCOPY WITH PROPOFOL  N/A 04/25/2017   Procedure: COLONOSCOPY WITH PROPOFOL ;  Surgeon: Jinny Carmine, MD;  Location: [facility];  Service: Endoscopy;  Laterality: N/A;  diabetic-oral med   DILATION AND CURETTAGE OF UTERUS     ESOPHAGOGASTRODUODENOSCOPY (EGD) WITH PROPOFOL  N/A 01/10/2021   Procedure: ESOPHAGOGASTRODUODENOSCOPY (EGD) WITH PROPOFOL ;  Surgeon: Janalyn Keene NOVAK, MD;  Location: [facility];  Service: Endoscopy;  Laterality: N/A;  Diabetic   EXTERNAL EAR SURGERY Left    x2   POLYPECTOMY  04/25/2017   Procedure: POLYPECTOMY INTESTINAL;  Surgeon: Jinny Carmine, MD;  Location: [facility];  Service: Endoscopy;;   SPINE SURGERY     herniated disc   TUBAL LIGATION     HPI:   Tricia [name] is a 63 y.o. female who presented to ED  on 10/30/2023 with acute onset of generalizated weakness and BLE swelling (L>R) after being found on the ground for several hours covered in urine and feces. Pt with a PMH significant for anxiety, osteoarthritis, asthma, COPD, depression, type 2 diabetes mellitus, GERD, dyslipidemia, and hypertension. CXR 1. Emphysema and background scarring unchanged since prior study. No  acute airspace disease.  During hospitalization, pt was evaluated/treated by ST with diet advanced to dysphagia 3 with thin liquids. Pt received NGT placement for external nutrition. NGT was discharged on 11/08/2023. ST consulted for diet recommendation.    Assessment / Plan / Recommendation  Clinical Impression  Pt presents as awake, alert and able to follow directions. Pt verbalizes that she would like to have more options for food such as bacon. As a result, skilled observation was provided of pt consuming graham crackers with peanut butter as well as thin liquids via straw. Despite being edentulous, pt demonstrated appropriate oral phase with complete oral clearing of all boluses. Pt's pharyngeal response appeared timely with no overt s/s of aspiration. At this time, pt appears at reduced risk of aspiration when following general aspiration precautions. Pt is appropriate for diet upgrade to regular with thin liquids, medicine whole with  thin liquids. ST services are no longer indicated at this time. SLP Visit Diagnosis: Dysphagia, unspecified (R13.10)    Aspiration Risk  Mild aspiration risk;No limitations    Diet Recommendation Regular;Thin liquid    Liquid Administration via: Cup;Straw Medication Administration: Whole meds with liquid Supervision: Patient able to self feed;Intermittent supervision to cue for compensatory strategies Compensations: Minimize environmental distractions;Slow rate;Small sips/bites Postural Changes: Seated upright at 90  degrees;Remain upright for at least 30 minutes after po intake    Other  Recommendations Oral Care Recommendations: Oral care BID     Functional Status Assessment Patient has not had a recent decline in their functional status    Swallow Study   General Date of Onset: 11/10/23 (new consult for potential diet upgrade) HPI: Tricia [name] is a 63 y.o. female who presented to ED  on 10/30/2023 with acute onset of generalizated weakness and BLE swelling (L>R) after being found on the ground for several hours covered in urine and feces. Pt with a PMH significant for anxiety, osteoarthritis, asthma, COPD, depression, type 2 diabetes mellitus, GERD, dyslipidemia, and hypertension. CXR 1. Emphysema and background scarring unchanged since prior study. No  acute airspace disease.  During hospitalization, pt was evaluated/treated by ST with diet advanced to dysphagia 3 with thin liquids. Pt received NGT placement for external nutrition. NGT was discharged on 11/08/2023. ST consulted for diet recommendation. Type of Study: Bedside Swallow Evaluation Previous Swallow Assessment: 10/30/2023 Diet Prior to this Study: Dysphagia 3 (mechanical soft);Thin liquids (Level 0) Temperature Spikes Noted: No Respiratory Status: Room air History of Recent Intubation: No Behavior/Cognition: Alert;Cooperative;Pleasant mood Oral Cavity Assessment: Within Functional Limits Oral Care Completed by SLP: No Oral Cavity - Dentition: Edentulous Vision: Functional for self-feeding Self-Feeding Abilities: Able to feed self Patient Positioning: Upright in bed Baseline Vocal Quality: Normal Volitional Cough: Strong Volitional Swallow: Able to elicit    Oral/Motor/Sensory Function Overall Oral Motor/Sensory Function: Within functional limits   Ice Chips Ice chips: Not tested   Thin Liquid Thin Liquid: Within functional limits Presentation: Self Fed;Straw    Nectar Thick Nectar Thick Liquid: Not tested   Honey  Thick Honey Thick Liquid: Not tested   Puree Puree: Not tested   Solid     Solid: Within functional limits Presentation: Self Fed     Nattie Lazenby B. Rubbie, M.S., CCC-SLP, Tree surgeon Certified Brain Injury Specialist [facility]  [facility] Office [phone] Ascom [phone] Fax [phone]

## 2023-11-10 NOTE — Consult Note (Signed)
 PHARMACY CONSULT NOTE - ELECTROLYTES  Pharmacy Consult for Electrolyte Monitoring and Replacement   Recent Labs: Height: 5' 8 (172.7 cm) Weight: 67 kg (147 lb 11.3 oz) IBW/kg (Calculated) : 63.9 Estimated Creatinine Clearance: 52.3 mL/min (A) (by C-G formula based on SCr of 1.11 mg/dL (H)). Potassium (mmol/L)  Date Value  11/10/2023 3.2 (L)  11/16/2013 4.0   Magnesium  (mg/dL)  Date Value  90/79/7974 1.7   Calcium  (mg/dL)  Date Value  90/78/7974 7.1 (L)   Calcium , Total (mg/dL)  Date Value  90/71/7984 7.9 (L)   Albumin  (g/dL)  Date Value  90/79/7974 1.8 (L)  07/06/2015 4.2  10/07/2012 2.9 (L)   Phosphorus (mg/dL)  Date Value  90/79/7974 2.7   Sodium (mmol/L)  Date Value  11/10/2023 135  07/06/2015 146 (H)  11/16/2013 131 (L)   Assessment  Tricia [name] is a 63 y.o. female presenting with weakness and leg swelling. PMH significant for anxiety, osteoarthritis, asthma, COPD, depression, type 2 diabetes mellitus, GERD, dyslipidemia, and hypertension. Pharmacy has been consulted to monitor and replace electrolytes.  Diet: dysphagia 3 diet MIVF: N/A Pertinent medications: N/A  Goal of Therapy: Electrolytes WNL  Plan:  --K 3.2, Kcl 40 mEq PO x 1 ordered --Will give Mg 2 g IV as well based on yesterday's result  Thank you for involving pharmacy in this patient's care.   Tricia [name] 11/10/2023 8:52 AM

## 2023-11-10 NOTE — Progress Notes (Signed)
 PROGRESS NOTE Tricia [name]    DOB: 1961-02-08, 63 y.o.  FMW:[id number]    Code Status: Full Code   DOA: 10/30/2023   LOS: 11  Brief hospital course  Tricia [name] is a 63 y.o. female with a PMH significant for anxiety, osteoarthritis, asthma, COPD, depression, type 2 diabetes mellitus, GERD, dyslipidemia, and HTN, who presented to the emergency room with acute onset of generalized weakness and bilateral lower extremity swelling after being found down after several hours.   ED Course: BP was 109/53 with heart rate of 114 and later BP was 96/52 and temperature was 99.8, respiratory rate was 22. Labs reveal hyponatremia 128 and hyperkalemia of 6 with a CO2 of 13 and glucose of 126, BUN of 42 and creatinine 3.64 calcium  of 7.8 and albumin  2 with total protein 5.7.  BNP was 78.5 and high-sensitivity troponin I was 14.  Lactic acid was 2.6 and CBC showed hemoglobin 11.8 and hematocrit 36.9.  Respiratory panel came back negative.  Blood cultures were drawn. EKG: sinus tachycardia with rate 118 with poor R wave progression. Imaging: Portable chest x-ray showed emphysema with no acute cardiopulmonary disease. The patient was given IV vancomycin  and  IV cefepime .  On initial evaluation- patient was lethargic and minimally responsive. BP low and not responding to IV fluids or increase in midodrine . CCM was consulted who took over care for patient who required vasopressors. Weaned from them 9/14. NG tube was placed for nutrition as she was unable to tolerate PO while incapacitated.  9/15: transferred back to [facility]  11/10/23 -patient BP stable, will mean midodrine . PO intake is low but consistent, will dc NG tube and add PO supplements. Renal function improved- remove tunneled cath as she is not likely to need HD. Respiratory status- improved back to baseline O2 use. Mobility is greatly reduced- she likely needs SNF but needs to be reevaluated by PT/   Assessment & Plan  Principal Problem:    Sepsis due to cellulitis [facility]) Active Problems:   AKI (acute kidney injury) (HCC)   Hyperkalemia   Asthma, chronic   Hyponatremia   Parkinson's disease (HCC)   GERD without esophagitis   Dyslipidemia   Gout   Sepsis (HCC)   Leg swelling  Septic shockdue to cellulitis of lower extremities- Resolved Serratia bacteremia manifested by tachycardia and hypotension, elevated LA, renal failure.Weaned off pressors 9/14.  On midodrine  at home 5 mg 3 times daily after recent hospitalization 09/2023 - Completed appropriate dose of antibiotics.  --Wean midodrine  to 2.5mg  daily with breakfast- Wound care  - PT/[facility]waiting SNF   Atypical chest pain Unclear etiology. Patient's VTE ppx was briefly stopped due to thought heparin  product was contributing to her thrombocytopenia.  She has no worsening dyspnea and no tachycardia.  Feel PE is less likely.  Chest pain is not typical of ischemic cardiac pain either given location and it was reproducible on exam.  EKG also without any evidence of acute ischemia. - Will place lidocaine  patch and continue Tylenol  - Will continue to monitor  Severe calorie malnutrition  Patient tolerating PO well.  - Continue meal supplements - Diet advanced to regular  Chronic hypoxic respiratory failure-reportedly on 2 L chronically at home.  She is weaned to room air here  Depression- continue home duloxetine , seroquel     AKI  Acute renal failure Resolving- Cr significantly increased from baseline up to 3.64 on presentation. unclear etiology Likely due to hemodynamics. (Hypotension) Serum creatinine stable Continue to avoid nephrotoxic agents  Latest Ref Rng & Units 11/10/2023    5:15 AM 11/09/2023    4:22 AM 11/08/2023    7:09 AM  BMP  Glucose 70 - 99 mg/dL 94  87  897   BUN 8 - 23 mg/dL 14  14  17    Creatinine 0.44 - 1.00 mg/dL 8.88  8.91  8.71   Sodium 135 - 145 mmol/L 135  134  137   Potassium 3.5 - 5.1 mmol/L 3.2  3.5  3.7   Chloride 98 - 111 mmol/L 102  98   97   CO2 22 - 32 mmol/L 26  26  26    Calcium  8.9 - 10.3 mg/dL 7.1  7.0  7.4    Hypokalemia Replace  Diabetes- diet controlled. A1c 5.1.   Normocytic anemia  Will continue to check periodically.  Ferritin 255 B12 nl 1 month ago   Thrombocytopenia  Resolved. Nadir 57k. Unclear etiology. Likely due to infection.  Lovenox  was resumed  Asthma, chronic - Not in exacerbation. continue theophylline , Singulair , and bronchodilator inhaler.   GERD without esophagitis -continue PPI therapy.   Parkinson's disease (HCC) - continue Sinemet  CR and Sinemet  IR.   Dyslipidemia- not on statin   Gout- not acutely exacerbated   Peripheral neuralgia - continue gabapentin   Vitamin D  deficiency  Continue vit D supplementation   Body mass index is 22.46 kg/m.  VTE ppx: Place TED hose Start: 11/09/23 1904 enoxaparin  (LOVENOX ) injection 40 mg Start: 11/09/23 1000  Diet:     Diet   Diet regular Fluid consistency: Thin   Consultants: CCM Nephrology   Subjective 11/10/23    Patient with some discomfort right lateral chest.  States when she takes deep breath she has pain in the R chest. She has no dyspnea or increased O2 requirement.  She also has no tachycardia.  EKG with no findings to suggest acute ischemia.  Pain was reproducible on exam when palpating her right chest.   Objective      11/10/2023    4:06 PM 11/10/2023   10:36 AM 11/10/2023    8:00 AM  Vitals with BMI  Systolic 109 119 874  Diastolic 57 64 67  Pulse 92 95 65     Intake/Output Summary (Last 24 hours) at 11/10/2023 1806 Last data filed at 11/10/2023 1240 Gross per 24 hour  Intake 480 ml  Output 200 ml  Net 280 ml    Filed Weights   11/08/23 0517 11/09/23 0718 11/10/23 0500  Weight: 68.3 kg 68.5 kg 67 kg      Constitutional: In no distress.  Cardiovascular: Normal rate, regular rhythm. No lower extremity edema  Pulmonary: Non labored breathing on room air, no wheezing or rales.   Abdominal: Soft.  Non distended and non tender Musculoskeletal: Normal range of motion.     Neurological: Alert and oriented to person, place, and time.  Skin: BLE bandaged, hyperpigmentation, no erythema    Labs   I have personally reviewed the following labs and imaging studies CBC    Component Value Date/Time   WBC 9.6 11/10/2023 0515   RBC 2.40 (L) 11/10/2023 0515   HGB 7.7 (L) 11/10/2023 0515   HGB 13.7 11/16/2013 2040   HCT 23.0 (L) 11/10/2023 0515   HCT 40.9 11/16/2013 2040   PLT 416 (H) 11/10/2023 0515   PLT 254 11/16/2013 2040   MCV 95.8 11/10/2023 0515   MCV 94 11/16/2013 2040   MCH 32.1 11/10/2023 0515   MCHC 33.5 11/10/2023 0515  RDW 15.3 11/10/2023 0515   RDW 14.0 11/16/2013 2040   LYMPHSABS 0.4 (L) 11/02/2023 1215   LYMPHSABS 2.7 11/16/2013 2040   MONOABS 0.2 11/02/2023 1215   MONOABS 0.6 11/16/2013 2040   EOSABS 0.0 11/02/2023 1215   EOSABS 0.1 11/16/2013 2040   BASOSABS 0.0 11/02/2023 1215   BASOSABS 0.1 11/16/2013 2040   BASOSABS 0 11/15/2011 1430      Latest Ref Rng & Units 11/10/2023    5:15 AM 11/09/2023    4:22 AM 11/08/2023    7:09 AM  BMP  Glucose 70 - 99 mg/dL 94  87  897   BUN 8 - 23 mg/dL 14  14  17    Creatinine 0.44 - 1.00 mg/dL 8.88  8.91  8.71   Sodium 135 - 145 mmol/L 135  134  137   Potassium 3.5 - 5.1 mmol/L 3.2  3.5  3.7   Chloride 98 - 111 mmol/L 102  98  97   CO2 22 - 32 mmol/L 26  26  26    Calcium  8.9 - 10.3 mg/dL 7.1  7.0  7.4    No results found.  Disposition Plan & Communication  Patient status: Inpatient  Admitted From: Home Planned disposition location: SNF Anticipated discharge date: TBD pending clinical development   Family Communication: None at bedside    Author: Alban Pepper, MD [facility] 11/10/2023, 6:06 PM   Available by Epic secure chat 7AM-7PM. If 7PM-7AM, please contact night-coverage.  [facility] contact information found on [email or web address].

## 2023-11-10 NOTE — Plan of Care (Signed)
  Problem: Fluid Volume: Goal: Hemodynamic stability will improve Outcome: Progressing   Problem: Coping: Goal: Ability to adjust to condition or change in health will improve Outcome: Progressing   Problem: Metabolic: Goal: Ability to maintain appropriate glucose levels will improve Outcome: Progressing   Problem: Nutrition: Goal: Adequate nutrition will be maintained Outcome: Progressing   Problem: Pain Managment: Goal: General experience of comfort will improve and/or be controlled Outcome: Progressing

## 2023-11-10 NOTE — Plan of Care (Signed)
  Problem: Fluid Volume: Goal: Hemodynamic stability will improve Outcome: Progressing   Problem: Clinical Measurements: Goal: Signs and symptoms of infection will decrease Outcome: Progressing   Problem: Respiratory: Goal: Ability to maintain adequate ventilation will improve Outcome: Progressing   

## 2023-11-11 ENCOUNTER — Inpatient Hospital Stay

## 2023-11-11 DIAGNOSIS — J690 Pneumonitis due to inhalation of food and vomit: Secondary | ICD-10-CM

## 2023-11-11 DIAGNOSIS — J90 Pleural effusion, not elsewhere classified: Secondary | ICD-10-CM | POA: Diagnosis not present

## 2023-11-11 LAB — BASIC METABOLIC PANEL WITH GFR
Anion gap: 7 (ref 5–15)
BUN: 14 mg/dL (ref 8–23)
CO2: 26 mmol/L (ref 22–32)
Calcium: 7.3 mg/dL — ABNORMAL LOW (ref 8.9–10.3)
Chloride: 102 mmol/L (ref 98–111)
Creatinine, Ser: 1.08 mg/dL — ABNORMAL HIGH (ref 0.44–1.00)
GFR, Estimated: 58 mL/min — ABNORMAL LOW (ref >60)
Glucose, Bld: 97 mg/dL (ref 70–99)
Potassium: 3.9 mmol/L (ref 3.5–5.1)
Sodium: 135 mmol/L (ref 135–145)

## 2023-11-11 LAB — SEROTONIN RELEASE ASSAY (SRA)
SRA .2 IU/mL UFH Ser-aCnc: 1 % (ref 0–20)
SRA 100IU/mL UFH Ser-aCnc: 1 % (ref 0–20)

## 2023-11-11 LAB — GLUCOSE, CAPILLARY
Glucose-Capillary: 106 mg/dL — ABNORMAL HIGH (ref 70–99)
Glucose-Capillary: 105 mg/dL — ABNORMAL HIGH (ref 70–99)
Glucose-Capillary: 98 mg/dL (ref 70–99)

## 2023-11-11 LAB — CBC
HCT: 24.4 % — ABNORMAL LOW (ref 36.0–46.0)
Hemoglobin: 8 g/dL — ABNORMAL LOW (ref 12.0–15.0)
MCH: 31.9 pg (ref 26.0–34.0)
MCHC: 32.8 g/dL (ref 30.0–36.0)
MCV: 97.2 fL (ref 80.0–100.0)
Platelets: 477 K/uL — ABNORMAL HIGH (ref 150–400)
RBC: 2.51 MIL/uL — ABNORMAL LOW (ref 3.87–5.11)
RDW: 15.7 % — ABNORMAL HIGH (ref 11.5–15.5)
WBC: 9.4 K/uL (ref 4.0–10.5)
nRBC: 0 % (ref 0.0–0.2)

## 2023-11-11 LAB — IRON AND TIBC
Iron: 30 ug/dL (ref 28–170)
Saturation Ratios: 26 % (ref 10.4–31.8)
TIBC: 118 ug/dL — ABNORMAL LOW (ref 250–450)
UIBC: 88 ug/dL

## 2023-11-11 LAB — MAGNESIUM: Magnesium: 2.2 mg/dL (ref 1.7–2.4)

## 2023-11-11 LAB — PROCALCITONIN: Procalcitonin: 0.5 ng/mL

## 2023-11-11 MED ORDER — FUROSEMIDE 10 MG/ML IJ SOLN
40.0000 mg | Freq: Once | INTRAMUSCULAR | Status: AC
  Administered 2023-11-11: 40 mg via INTRAVENOUS
  Filled 2023-11-11: qty 4

## 2023-11-11 MED ORDER — FUROSEMIDE 40 MG PO TABS
40.0000 mg | ORAL_TABLET | Freq: Every day | ORAL | Status: DC
  Administered 2023-11-12: 40 mg via ORAL
  Filled 2023-11-11: qty 1

## 2023-11-11 MED ORDER — GABAPENTIN 300 MG PO CAPS
600.0000 mg | ORAL_CAPSULE | Freq: Two times a day (BID) | ORAL | Status: DC
  Administered 2023-11-12 – 2023-11-17 (×11): 600 mg via ORAL
  Filled 2023-11-11 (×11): qty 2

## 2023-11-11 MED ORDER — GABAPENTIN 300 MG PO CAPS
600.0000 mg | ORAL_CAPSULE | Freq: Every day | ORAL | Status: DC
Start: 2023-11-11 — End: 2023-11-17
  Administered 2023-11-11 – 2023-11-16 (×6): 600 mg via ORAL
  Filled 2023-11-11 (×6): qty 2

## 2023-11-11 MED ORDER — AMOXICILLIN-POT CLAVULANATE 875-125 MG PO TABS
1.0000 | ORAL_TABLET | Freq: Two times a day (BID) | ORAL | Status: DC
  Administered 2023-11-11 – 2023-11-12 (×3): 1 via ORAL
  Filled 2023-11-11 (×3): qty 1

## 2023-11-11 NOTE — Progress Notes (Signed)
 Physical Therapy Treatment Patient Details Name: Tricia [name] MRN: [id number] DOB: [birth year] Today's Date: 11/11/2023   History of Present Illness Pt is a 63 y.o. female with medical history significant of COPD, prediabetes, hyperlipidemia, GERD, migraine headaches, anxiety, depression, benign essential tremor that presented to ED for BLE swelling, weakness. Workup for cellulitis of BLE, AKI, admission complicated by septic shock and acute COPD exacerbation.    PT Comments  Pt received in bed for co-tx with OT for safe functional mobility progression. Continued c/o Peripheral Neuropathy pain R foot > L with 8/10 pain. Pt required ModA of 1(2) depending on height of surface to stand in [place]. Good upright standing posture achieved and able to maintain briefly prior to fatiguing. Pt positioned to comfort in chair with ice to R lower leg for reduced edema and discomfort. Pt educated on B LE HEP to facilitate circulation and improve/maintain strength.   If plan is discharge home, recommend the following: Two people to help with walking and/or transfers;Two people to help with bathing/dressing/bathroom;Help with stairs or ramp for entrance;Assist for transportation;Assistance with feeding;Assistance with cooking/housework   Can travel by private vehicle     No  Equipment Recommendations  Other (comment) (TBD)    Recommendations for Other Services       Precautions / Restrictions Precautions Precautions: Fall Recall of Precautions/Restrictions: Impaired Restrictions Weight Bearing Restrictions Per Provider Order: No     Mobility  Bed Mobility Overal bed mobility: Needs Assistance Bed Mobility: Sidelying to Sit, Rolling Rolling: Supervision Sidelying to sit: Min assist       General bed mobility comments: Verbal cues for technique, improved bed mobility on this date    Transfers Overall transfer level: Needs assistance Equipment used: Ambulation equipment  used Transfers: Sit to/from Stand Sit to Stand: Via lift equipment           General transfer comment: STS from elevated bed height with UE supports via stedy with MODA +2 to come to full standing position. Transfer via Lift Equipment: Stedy  Ambulation/Gait               General Gait Details: unable 2/2 pain in bil feet.   Stairs             Wheelchair Mobility     Tilt Bed    Modified Rankin (Stroke Patients Only)       Balance Overall balance assessment: Needs assistance Sitting-balance support: Feet supported, No upper extremity supported Sitting balance-Leahy Scale: Fair Sitting balance - Comments: Steady reach within BOS   Standing balance support: Bilateral upper extremity supported, Reliant on assistive device for balance Standing balance-Leahy Scale: Poor Standing balance comment: Pt remains heavy reliant on external supports to assist in off loading RLE for static standing                            Communication Communication Communication: Impaired Factors Affecting Communication: Difficulty expressing self  Cognition Arousal: Alert Behavior During Therapy: WFL for tasks assessed/performed   PT - Cognitive impairments: No family/caregiver present to determine baseline                         Following commands: Intact      Cueing Cueing Techniques: Verbal cues, Visual cues  Exercises Other Exercises Other Exercises: Pt educated on role of PT, attempting to work through neuropathy pain to progress mobility. Benefits of OOB activity Other Exercises:  (  Pt given B LE exercise program upon completion of session with good understanding)    General Comments        Pertinent Vitals/Pain Pain Assessment Pain Assessment: 0-10 Pain Score: 8  Pain Location: R foot neuropathy Pain Descriptors / Indicators: Pins and needles, Stabbing Pain Intervention(s): Limited activity within patient's tolerance    Home Living                           Prior Function            PT Goals (current goals can now be found in the care plan section) Acute Rehab PT Goals Patient Stated Goal: to have less pain Progress towards PT goals: Progressing toward goals    Frequency    Min 2X/week      PT Plan      Co-evaluation   Reason for Co-Treatment: Complexity of the patient's impairments (multi-system involvement);For patient/therapist safety PT goals addressed during session: Mobility/safety with mobility;Proper use of DME;Strengthening/ROM OT goals addressed during session: ADL's and self-care;Proper use of Adaptive equipment and DME      AM-PAC PT 6 Clicks Mobility   Outcome Measure  Help needed turning from your back to your side while in a flat bed without using bedrails?: A Lot Help needed moving from lying on your back to sitting on the side of a flat bed without using bedrails?: A Lot Help needed moving to and from a bed to a chair (including a wheelchair)?: Total Help needed standing up from a chair using your arms (e.g., wheelchair or bedside chair)?: Total Help needed to walk in hospital room?: Total Help needed climbing 3-5 steps with a railing? : Total 6 Click Score: 8    End of Session Equipment Utilized During Treatment: Gait belt Activity Tolerance: Patient limited by fatigue;Patient limited by pain Patient left: in chair;with chair alarm set;with call bell/phone within reach;with nursing/sitter in room Nurse Communication: Mobility status PT Visit Diagnosis: Other abnormalities of gait and mobility (R26.89);Difficulty in walking, not elsewhere classified (R26.2);Muscle weakness (generalized) (M62.81)     Time: 8963-8940 PT Time Calculation (min) (ACUTE ONLY): 23 min  Charges:    $Therapeutic Activity: 8-22 mins PT General Charges $$ ACUTE PT VISIT: 1 Visit                    Darice Bohr, PTA  Darice JAYSON Bohr 11/11/2023, 1:17 PM

## 2023-11-11 NOTE — Progress Notes (Signed)
 PROGRESS NOTE Tricia [name]    DOB: 1960/05/17, 63 y.o.  FMW:[id number]    Code Status: Full Code   DOA: 10/30/2023   LOS: 12  Brief hospital course  Tricia [name] is a 63 y.o. female with a PMH significant for anxiety, osteoarthritis, asthma, COPD, depression, type 2 diabetes mellitus, GERD, dyslipidemia, and HTN, who presented to the emergency room with acute onset of generalized weakness and bilateral lower extremity swelling after being found down after several hours.   ED Course: BP was 109/53 with heart rate of 114 and later BP was 96/52 and temperature was 99.8, respiratory rate was 22. Labs reveal hyponatremia 128 and hyperkalemia of 6 with a CO2 of 13 and glucose of 126, BUN of 42 and creatinine 3.64 calcium  of 7.8 and albumin  2 with total protein 5.7.  BNP was 78.5 and high-sensitivity troponin I was 14.  Lactic acid was 2.6 and CBC showed hemoglobin 11.8 and hematocrit 36.9.  Respiratory panel came back negative.  Blood cultures were drawn. EKG: sinus tachycardia with rate 118 with poor R wave progression. Imaging: Portable chest x-ray showed emphysema with no acute cardiopulmonary disease. The patient was given IV vancomycin  and  IV cefepime .  On initial evaluation- patient was lethargic and minimally responsive. BP low and not responding to IV fluids or increase in midodrine . CCM was consulted who took over care for patient who required vasopressors. Weaned from them 9/14. NG tube was placed for nutrition as she was unable to tolerate PO while incapacitated.  9/15: transferred back to [facility]  11/11/23 -patient BP stable, will mean midodrine . PO intake is low but consistent, will dc NG tube and add PO supplements. Renal function improved- remove tunneled cath as she is not likely to need HD. Respiratory status- improved back to baseline O2 use. Mobility is greatly reduced- she likely needs SNF but needs to be reevaluated by PT/   Assessment & Plan  Principal Problem:    Sepsis due to cellulitis [facility]) Active Problems:   AKI (acute kidney injury)   Hyperkalemia   Asthma, chronic   Hyponatremia   Parkinson's disease (HCC)   GERD without esophagitis   Dyslipidemia   Gout   Sepsis (HCC)   Leg swelling  Septic shockdue to cellulitis of lower extremities- Resolved Serratia bacteremia manifested by tachycardia and hypotension, elevated LA, renal failure.Weaned off pressors 9/14.  On midodrine  at home 5 mg 3 times daily after recent hospitalization 09/2023 - Completed appropriate dose of antibiotics.  --Wean midodrine  to 2.5mg  daily with breakfast- Wound care  - PT/Otawaiting SNF   Atypical chest pain Low suspicion for ACS.  CTA chest without PE.  Noted to have moderate pleural effusion on the right side this could be culprit of her symptoms.  Also noted to have possible pneumonia. - Consulted IR for thoracentesis - Treat for pneumonia  HfPEF Resume lasix     Possible aspiration PNA Underwent MBSS with speech, no appreciable aspiration events.  Continue regular diet  Start augmentin     Severe calorie malnutrition  Patient tolerating PO well.  - Continue meal supplements - Diet advanced to regular  Chronic hypoxic respiratory failure-reportedly on 2 L chronically at home.  She is weaned to room air here  Depression- continue home duloxetine , seroquel     AKI  Acute renal failure Resolving- Cr significantly increased from baseline up to 3.64 on presentation.  Likely due to hemodynamics. (Hypotension) Serum creatinine stable Continue to avoid nephrotoxic agents    Latest Ref Rng &  Units 11/11/2023    5:04 AM 11/10/2023    5:15 AM 11/09/2023    4:22 AM  BMP  Glucose 70 - 99 mg/dL 97  94  87   BUN 8 - 23 mg/dL 14  14  14    Creatinine 0.44 - 1.00 mg/dL 8.91  8.88  8.91   Sodium 135 - 145 mmol/L 135  135  134   Potassium 3.5 - 5.1 mmol/L 3.9  3.2  3.5   Chloride 98 - 111 mmol/L 102  102  98   CO2 22 - 32 mmol/L 26  26  26    Calcium  8.9 - 10.3  mg/dL 7.3  7.1  7.0    Hypokalemia Resolved  Diabetes- diet controlled. A1c 5.1.   Normocytic anemia  Will continue to check periodically.  Ferritin 255 B12 nl 1 month ago   Thrombocytopenia  Resolved. Nadir 57k. Unclear etiology. Likely due to infection.  Lovenox  was resumed  Asthma, chronic - Not in exacerbation. continue theophylline , Singulair , and bronchodilator inhaler.   GERD without esophagitis -continue PPI therapy.   Parkinson's disease (HCC) - continue Sinemet  CR and Sinemet  IR.   Dyslipidemia- not on statin   Gout- not acutely exacerbated   Peripheral neuralgia - continue gabapentin , Increased gabapentin  now that renal function continues to improve.   Vitamin D  deficiency  Continue vit D supplementation   Body mass index is 22.12 kg/m.  VTE ppx: Place TED hose Start: 11/09/23 1904 enoxaparin  (LOVENOX ) injection 40 mg Start: 11/09/23 1000  Diet:     Diet   Diet regular Fluid consistency: Thin   Consultants: CCM Nephrology   Subjective 11/11/23    NO acute issues overnight, R chest wall discomfort is improved.  Has a cough.    Objective      11/11/2023    7:39 PM 11/11/2023    5:34 PM 11/11/2023    7:44 AM  Vitals with BMI  Systolic 123 111 879  Diastolic 69 65 69  Pulse 95 94 92     Intake/Output Summary (Last 24 hours) at 11/11/2023 1948 Last data filed at 11/11/2023 1304 Gross per 24 hour  Intake 120 ml  Output 400 ml  Net -280 ml    Filed Weights   11/09/23 0718 11/10/23 0500 11/11/23 0544  Weight: 68.5 kg 67 kg 66 kg       Constitutional: In no distress.  Cardiovascular: Normal rate, regular rhythm. No lower extremity edema  Pulmonary: Non labored breathing on room air, no wheezing or rales.   Abdominal: Soft. Non distended and non tender  Neurological: Alert and oriented to person, place, and time. Skin: BLE  hyperpigmented, serous drainage     Labs   I have personally reviewed the following labs and imaging  studies CBC    Component Value Date/Time   WBC 9.4 11/11/2023 0504   RBC 2.51 (L) 11/11/2023 0504   HGB 8.0 (L) 11/11/2023 0504   HGB 13.7 11/16/2013 2040   HCT 24.4 (L) 11/11/2023 0504   HCT 40.9 11/16/2013 2040   PLT 477 (H) 11/11/2023 0504   PLT 254 11/16/2013 2040   MCV 97.2 11/11/2023 0504   MCV 94 11/16/2013 2040   MCH 31.9 11/11/2023 0504   MCHC 32.8 11/11/2023 0504   RDW 15.7 (H) 11/11/2023 0504   RDW 14.0 11/16/2013 2040   LYMPHSABS 0.4 (L) 11/02/2023 1215   LYMPHSABS 2.7 11/16/2013 2040   MONOABS 0.2 11/02/2023 1215   MONOABS 0.6 11/16/2013 2040   EOSABS 0.0  11/02/2023 1215   EOSABS 0.1 11/16/2013 2040   BASOSABS 0.0 11/02/2023 1215   BASOSABS 0.1 11/16/2013 2040   BASOSABS 0 11/15/2011 1430      Latest Ref Rng & Units 11/11/2023    5:04 AM 11/10/2023    5:15 AM 11/09/2023    4:22 AM  BMP  Glucose 70 - 99 mg/dL 97  94  87   BUN 8 - 23 mg/dL 14  14  14    Creatinine 0.44 - 1.00 mg/dL 8.91  8.88  8.91   Sodium 135 - 145 mmol/L 135  135  134   Potassium 3.5 - 5.1 mmol/L 3.9  3.2  3.5   Chloride 98 - 111 mmol/L 102  102  98   CO2 22 - 32 mmol/L 26  26  26    Calcium  8.9 - 10.3 mg/dL 7.3  7.1  7.0    DG Swallowing Func-Speech Pathology Result Date: 11/11/2023 Table formatting from the original result was not included. Modified Barium Swallow Study Patient Details Name: MELEENA MUNROE MRN: [id number] Date of Birth: 1960/08/10 Today's Date: 11/11/2023 HPI/PMH: HPI: MAKYLA BYE is a 63 y.o. female who presented to ED  on 10/30/2023 with acute onset of generalizated weakness and BLE swelling (L>R) after being found on the ground for several hours covered in urine and feces. Pt with a PMH significant for anxiety, osteoarthritis, asthma, COPD, depression, type 2 diabetes mellitus, GERD, dyslipidemia, and hypertension. CXR 1. Emphysema and background scarring unchanged since prior study. No  acute airspace disease.  During hospitalization, pt was  evaluated/treated by ST with diet advanced to dysphagia 3 with thin liquids. Pt received NGT placement for external nutrition. NGT was discharged on 11/08/2023. ST consulted for diet recommendation. Clinical Impression: Clinical Impression: Pt presents with minimal oropharyngeal dysphagia. Pharyngeal phase notable for min reduced laryngeal vestibule closure resultant in x1 occurrence of micro aspiration and shallow penetration (trace coating on the underside of the epiglottis) with thin liquid. Of note, micro aspiration occurring on first captured swallow and not repeated despite thorough testing. Shallow, trace penetration occurring inconsistently, and did not advance into laryngeal vestibule. Oral phase notable for impaired mastication, directly impacted by lack of natural dentition.     Overall, suspect that current presentation is likely close to baseline oropharyngeal swallow function. However, current outstanding factors (medical compromise, limited mobility/deconditioning) and baseline GI (GERD) and resp (COPD) components increase risk for aspiration and adverse events. Recommend continued regular solids and thin liquids with aspiration precautions (slow rate, small bites, elevated HOB, alert for PO intake). Emphasize oral care to reduce bacterial collection and mobility to aid pulmonary clearance. No follow up SLP services indicated. Factors that may increase risk of adverse event in presence of aspiration Noe & Lianne 2021): Factors that may increase risk of adverse event in presence of aspiration Noe & Lianne 2021): Poor general health and/or compromised immunity; Respiratory or GI disease; Limited mobility; Frail or deconditioned Recommendations/Plan: Swallowing Evaluation Recommendations Swallowing Evaluation Recommendations Recommendations: PO diet PO Diet Recommendation: Regular; Thin liquids (Level 0) Liquid Administration via: Straw; Cup Medication Administration: Whole meds with puree (vs  liquid) Supervision: Patient able to self-feed; Intermittent supervision/cueing for swallowing strategies Swallowing strategies  : Minimize environmental distractions; Slow rate; Small bites/sips Postural changes: Position pt fully upright for meals; Stay upright 30-60 min after meals Oral care recommendations: Oral care BID (2x/day); Staff/trained caregiver to provide oral care Treatment Plan Treatment Plan Treatment recommendations: No treatment recommended at this time Follow-up recommendations: No  SLP follow up Functional status assessment: Patient has not had a recent decline in their functional status. Recommendations Recommendations for follow up therapy are one component of a multi-disciplinary discharge planning process, led by the attending physician.  Recommendations may be updated based on patient status, additional functional criteria and insurance authorization. Assessment: Orofacial Exam: Orofacial Exam Oral Cavity: Oral Hygiene: WFL Oral Cavity - Dentition: Edentulous Orofacial Anatomy: WFL Oral Motor/Sensory Function: WFL Anatomy: Anatomy: WFL Boluses Administered: Boluses Administered Boluses Administered: Thin liquids (Level 0); Mildly thick liquids (Level 2, nectar thick); Moderately thick liquids (Level 3, honey thick); Puree; Solid  Oral Impairment Domain: Oral Impairment Domain Lip Closure: No labial escape Tongue control during bolus hold: Cohesive bolus between tongue to palatal seal Bolus preparation/mastication: Disorganized chewing/mashing with solid pieces of bolus unchewed (baseline secondary to dentition) Bolus transport/lingual motion: Brisk tongue motion Oral residue: Trace residue lining oral structures Location of oral residue : Tongue; Floor of mouth Initiation of pharyngeal swallow : Pyriform sinuses  Pharyngeal Impairment Domain: Pharyngeal Impairment Domain Soft palate elevation: No bolus between soft palate (SP)/pharyngeal wall (PW) Laryngeal elevation: Complete superior  movement of thyroid  cartilage with complete approximation of arytenoids to epiglottic petiole Anterior hyoid excursion: Complete anterior movement Epiglottic movement: Complete inversion Laryngeal vestibule closure: Incomplete, narrow column air/contrast in laryngeal vestibule Pharyngeal stripping wave : Present - complete Pharyngeal contraction (A/P view only): N/A Pharyngoesophageal segment opening: Complete distension and complete duration, no obstruction of flow Tongue base retraction: No contrast between tongue base and posterior pharyngeal wall (PPW) Pharyngeal residue: Trace residue within or on pharyngeal structures Location of pharyngeal residue: Valleculae  Esophageal Impairment Domain: Esophageal Impairment Domain Esophageal clearance upright position: Complete clearance, esophageal coating Pill: Pill Consistency administered: -- (n/a) Penetration/Aspiration Scale Score: Penetration/Aspiration Scale Score 1.  Material does not enter airway: Mildly thick liquids (Level 2, nectar thick); Moderately thick liquids (Level 3, honey thick); Puree; Solid 3.  Material enters airway, remains ABOVE vocal cords and not ejected out: Thin liquids (Level 0) 8.  Material enters airway, passes BELOW cords without attempt by patient to eject out (silent aspiration) : Thin liquids (Level 0) (x1 instance of micro aspiration) Compensatory Strategies: Compensatory Strategies Compensatory strategies: No   General Information: Caregiver present: No  Diet Prior to this Study: Regular; Thin liquids (Level 0)   Temperature : Normal   Respiratory Status: WFL   Supplemental O2: None (Room air)   History of Recent Intubation: No  Behavior/Cognition: Alert; Cooperative; Pleasant mood Self-Feeding Abilities: Able to self-feed Baseline vocal quality/speech: Normal Volitional Cough: Able to elicit Volitional Swallow: Able to elicit Exam Limitations: No limitations Goal Planning: No data recorded No data recorded No data recorded  Patient/Family Stated Goal: none present Consulted and agree with results and recommendations: Patient; Physician; Nurse Pain: Pain Assessment Pain Assessment: 0-10 Pain Score: 8 Faces Pain Scale: 6 Pain Location: R foot neuropathy Pain Descriptors / Indicators: Pins and needles; Stabbing Pain Intervention(s): Limited activity within patient's tolerance; Monitored during session; Patient requesting pain meds-RN notified End of Session: Start Time:SLP Start Time (ACUTE ONLY): 1100 Stop Time: SLP Stop Time (ACUTE ONLY): 1145 Time Calculation:SLP Time Calculation (min) (ACUTE ONLY): 45 min Charges: SLP Evaluations $ SLP Speech Visit: 1 Visit SLP Evaluations $BSS Swallow: 1 Procedure $MBS Swallow: 1 Procedure SLP visit diagnosis: SLP Visit Diagnosis: Dysphagia, oropharyngeal phase (R13.12) Past Medical History: Past Medical History: Diagnosis Date  Anxiety   Arthritis   joints and hands/ knees  Asthma   uses inhaler  Benign  essential tremor   head  Cervical dystonia   neck pain  Cholesteatoma of left ear   x2  COPD (chronic obstructive pulmonary disease) (HCC)   Cough   Depression   Diabetes mellitus without complication (HCC)   type 2  Diastolic dysfunction   Dyspnea   Dysrhythmia   diastolic dysfunction  GERD (gastroesophageal reflux disease)   Headache   migraines/ one per week  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  Hyperlipidemia   Hypertension   Motion sickness   boat  Neuromuscular disorder (HCC)   neuropathy feet and hands( nerve damage)  Wears dentures   upper and lower Past Surgical History: Past Surgical History: Procedure Laterality Date  CARPAL TUNNEL RELEASE Bilateral   x2 right, 1x on left  COLONOSCOPY    COLONOSCOPY WITH PROPOFOL  N/A 04/25/2017  Procedure: COLONOSCOPY WITH PROPOFOL ;  Surgeon: Jinny Carmine, MD;  Location: [facility];  Service: Endoscopy;  Laterality: N/A;  diabetic-oral med  DILATION AND CURETTAGE OF UTERUS    ESOPHAGOGASTRODUODENOSCOPY (EGD) WITH PROPOFOL  N/A 01/10/2021   Procedure: ESOPHAGOGASTRODUODENOSCOPY (EGD) WITH PROPOFOL ;  Surgeon: Janalyn Keene NOVAK, MD;  Location: [facility];  Service: Endoscopy;  Laterality: N/A;  Diabetic  EXTERNAL EAR SURGERY Left   x2  POLYPECTOMY  04/25/2017  Procedure: POLYPECTOMY INTESTINAL;  Surgeon: Jinny Carmine, MD;  Location: [facility];  Service: Endoscopy;;  SPINE SURGERY    herniated disc  TUBAL LIGATION   [place] Jarrett Clapp, MS, CCC-SLP Speech Language Pathologist Rehab Services; [facility] [place] [phone] (ascom) [place] J Clapp 11/11/2023, 12:36 PM  CT Angio Chest Pulmonary Embolism (PE) W or WO Contrast Result Date: 11/10/2023 EXAM: CTA of the Chest with contrast for PE 11/10/2023 10:44:08 PM TECHNIQUE: CTA of the chest was performed after the administration of intravenous contrast. Multiplanar reformatted images are provided for review. MIP images are provided for review. Automated exposure control, iterative reconstruction, and/or weight based adjustment of the mA/kV was utilized to reduce the radiation dose to as low as reasonably achievable. COMPARISON: 09/22/2023 CLINICAL HISTORY: Pulmonary embolism (PE) suspected, low to intermediate prob, positive D-dimer. FINDINGS: PULMONARY ARTERIES: Pulmonary arteries are adequately opacified for evaluation. No pulmonary embolism. Main pulmonary artery is normal in caliber. MEDIASTINUM: The heart and pericardium demonstrate no acute abnormality. Mild thoracic aortic atherosclerosis. There is no acute abnormality of the thoracic aorta. LYMPH NODES: Small mediastinal lymph nodes, including a dominant 12 mm short-axis subcarinal node, likely reactive. LUNGS AND PLEURA: Multifocal patchy/nodular opacities, right middle lobe and lingular predominant, suggesting multifocal pneumonia, possibly on the basis of aspiration. Moderate right pleural effusion with associated compressive atelectasis in the right lower lobe. Trace left pleural effusion. UPPER ABDOMEN: Layering gallstones,  without associated inflammatory changes. Mild-to-moderate bilateral hydronephrosis, chronic, improved. Moderate upper abdominal ascites, progressive. Mild hepatic steatosis. SOFT TISSUES AND BONES: No acute bone or soft tissue abnormality. IMPRESSION: 1. No evidence of pulmonary embolism. 2. Multifocal pneumonia, possibly on the basis of aspiration, as above. 3. Moderate right pleural effusion. Trace left pleural effusion. 4. Additional ancillary findings as above. Electronically signed by: Pinkie Pebbles MD 11/10/2023 10:49 PM EDT RP Workstation: [id number]    Disposition Plan & Communication  Patient status: Inpatient  Admitted From: Home Planned disposition location: SNF Anticipated discharge date: TBD pending clinical development   Family Communication: None at bedside    Author: Alban Pepper, MD [facility] 11/11/2023, 7:48 PM   Available by Epic secure chat 7AM-7PM. If 7PM-7AM, please contact night-coverage.  [facility]  contact information found on [email or web address].

## 2023-11-11 NOTE — Progress Notes (Addendum)
 Occupational Therapy Treatment Patient Details Name: Tricia [name] MRN: [id number] DOB: 06-17-1960 Today's Date: 11/11/2023   History of present illness Pt is a 63 y.o. female with medical history significant of COPD, prediabetes, hyperlipidemia, GERD, migraine headaches, anxiety, depression, benign essential tremor that presented to ED for BLE swelling, weakness. Workup for cellulitis of BLE, AKI, admission complicated by septic shock and acute COPD exacerbation.   OT comments  Pt seen for OT/PT co treatment on this date. Upon arrival to room pt semi supine in bed, agreeable to tx. Pt requires MINA to come to the EOB with use of bed rails and cues for hand placement. Pt completed seated ADL tasks with setupA, MAXA LB dressing. Pt STS from elevated bed height with use of stedy bar to assist in pulling self up, pt required MODA+2 to come to full standing position, unable to maintain standing throughout transfer into recliner via stedy, pt rested into stedy and with MIN verbal cues. Pt stood from semi standing position from stedy with MODA + 1, and MINA physical support lowered self into the recliner. Pt making good progress toward goals, will continue to follow POC. Discharge recommendation remains appropriate.        If plan is discharge home, recommend the following:  Two people to help with walking and/or transfers;Two people to help with bathing/dressing/bathroom;Assistance with cooking/housework;Assist for transportation;Help with stairs or ramp for entrance;Supervision due to cognitive status;Direct supervision/assist for medications management   Equipment Recommendations  Other (comment)    Recommendations for Other Services      Precautions / Restrictions Precautions Precautions: Fall Recall of Precautions/Restrictions: Impaired Restrictions Weight Bearing Restrictions Per Provider Order: No       Mobility Bed Mobility Overal bed mobility: Needs Assistance Bed Mobility:  Sidelying to Sit, Rolling Rolling: Supervision Sidelying to sit: Min assist       General bed mobility comments: Verbal cues for technique, improved bed mobility on this date    Transfers Overall transfer level: Needs assistance   Transfers: Sit to/from Stand Sit to Stand: Via lift equipment           General transfer comment: STS from elevated bed height with UE supports via stedy with MODA +2 to come to full standing position. Transfer via Lift Equipment: Stedy   Balance Overall balance assessment: Needs assistance Sitting-balance support: Feet supported, No upper extremity supported Sitting balance-Leahy Scale: Fair Sitting balance - Comments: Steady reach within BOS   Standing balance support: Bilateral upper extremity supported, Reliant on assistive device for balance Standing balance-Leahy Scale: Poor Standing balance comment: Pt remains heavy reliant on external supports to assist in off loading RLE for static standing                           ADL either performed or assessed with clinical judgement   ADL Overall ADL's : Needs assistance/impaired Eating/Feeding: Set up;Sitting   Grooming: Wash/dry face;Wash/dry hands;Sitting;Set up;Brushing hair                   Toilet Transfer: Moderate assistance;+2 for physical assistance (STS from elevated EOB) Toilet Transfer Details (indicate cue type and reason): utilized stedy for simulated transfer into the recliner                Communication Communication Communication: Impaired Factors Affecting Communication: Difficulty expressing self   Cognition Arousal: Alert Behavior During Therapy: WFL for tasks assessed/performed Cognition: No family/caregiver present to determine baseline  Following commands: Intact        Cueing   Cueing Techniques: Verbal cues, Visual cues  Exercises Exercises: Other exercises Other Exercises Other Exercises:  Edu: Role of OT treatment, safe ADL completion, benefits of OOB activity, STS technique with use of stedy                 Pertinent Vitals/ Pain       Pain Assessment Pain Assessment: 0-10 Pain Score: 8  Pain Location: R foot neuropathy Pain Descriptors / Indicators: Pins and needles, Stabbing Pain Intervention(s): Limited activity within patient's tolerance, Monitored during session, Patient requesting pain meds-RN notified                                                          Frequency  Min 2X/week        Progress Toward Goals  OT Goals(current goals can now be found in the care plan section)  Progress towards OT goals: Progressing toward goals  Acute Rehab OT Goals OT Goal Formulation: With patient Time For Goal Achievement: 11/16/23 Potential to Achieve Goals: Good ADL Goals Pt Will Perform Grooming: sitting;with set-up;with supervision Pt Will Perform Lower Body Dressing: with min assist;sit to/from stand;sitting/lateral leans;with adaptive equipment Pt Will Transfer to Toilet: ambulating;bedside commode;with contact guard assist  Plan      Co-evaluation    PT/OT/SLP Co-Evaluation/Treatment: Yes Reason for Co-Treatment: Complexity of the patient's impairments (multi-system involvement);For patient/therapist safety PT goals addressed during session: Mobility/safety with mobility;Proper use of DME;Strengthening/ROM OT goals addressed during session: ADL's and self-care;Proper use of Adaptive equipment and DME      AM-PAC OT 6 Clicks Daily Activity     Outcome Measure   Help from another person eating meals?: A Little Help from another person taking care of personal grooming?: A Lot Help from another person toileting, which includes using toliet, bedpan, or urinal?: A Lot Help from another person bathing (including washing, rinsing, drying)?: A Lot Help from another person to put on and taking off regular upper body clothing?:  A Lot Help from another person to put on and taking off regular lower body clothing?: A Lot 6 Click Score: 13    End of Session Equipment Utilized During Treatment: Rolling walker (2 wheels);Gait belt  OT Visit Diagnosis: Other abnormalities of gait and mobility (R26.89);Muscle weakness (generalized) (M62.81);Pain Pain - Right/Left: Right Pain - part of body: Knee;Leg;Ankle and joints of foot   Activity Tolerance Patient tolerated treatment well   Patient Left with call bell/phone within reach;in chair;with chair alarm set;with nursing/sitter in room   Nurse Communication Mobility status;Patient requests pain meds        Time: 8963-8940 OT Time Calculation (min): 23 min  Charges: OT General Charges $OT Visit: 1 Visit OT Treatments $Self Care/Home Management : 8-22 mins  Larraine Colas M.S. OTR/L  11/11/23, 1:05 PM

## 2023-11-11 NOTE — Progress Notes (Addendum)
 Modified Barium Swallow Study  Patient Details  Name: Tricia [name] MRN: [id number] Date of Birth: [birth year]  Today's Date: 11/11/2023  Modified Barium Swallow completed.  Full report located under Chart Review in the Imaging Section.  History of Present Illness Tricia [name] is a 63 y.o. female who presented to ED  on 10/30/2023 with acute onset of generalizated weakness and BLE swelling (L>R) after being found on the ground for several hours covered in urine and feces. Pt with a PMH significant for anxiety, osteoarthritis, asthma, COPD, depression, type 2 diabetes mellitus, GERD, dyslipidemia, and hypertension. CXR 1. Emphysema and background scarring unchanged since prior study. No  acute airspace disease.  During hospitalization, pt was evaluated/treated by ST with diet advanced to dysphagia 3 with thin liquids. Pt received NGT placement for external nutrition. NGT was discharged on 11/08/2023. ST consulted for diet recommendation.   Clinical Impression Pt presents with minimal oropharyngeal dysphagia. Pharyngeal phase notable for min reduced laryngeal vestibule closure resultant in x1 occurrence of micro aspiration and shallow penetration (trace coating on the underside of the epiglottis) with thin liquid. Of note, micro aspiration occurring on first captured swallow and not repeated despite thorough testing. Shallow, trace penetration occurring inconsistently, and did not advance into laryngeal vestibule. Oral phase notable for impaired mastication, directly impacted by lack of natural dentition.   Overall, suspect that current presentation is likely close to baseline oropharyngeal swallow function. However, current outstanding factors (medical compromise, limited mobility/deconditioning) and baseline GI (GERD) and resp (COPD) components increase risk for aspiration and adverse events. Recommend continued regular solids and thin liquids with aspiration precautions (slow  rate, small bites, elevated HOB, alert for PO intake). Emphasize oral care to reduce bacterial collection and mobility to aid pulmonary clearance. No follow up SLP services indicated.   Factors that may increase risk of adverse event in presence of aspiration Tricia [name] 2021): Poor general health and/or compromised immunity;Respiratory or GI disease;Limited mobility;Frail or deconditioned  Swallow Evaluation Recommendations Recommendations: PO diet PO Diet Recommendation: Regular;Thin liquids (Level 0) Liquid Administration via: Straw;Cup Medication Administration: Whole meds with puree (vs liquid) Supervision: Patient able to self-feed;Intermittent supervision/cueing for swallowing strategies Swallowing strategies  : Minimize environmental distractions;Slow rate;Small bites/sips Postural changes: Position pt fully upright for meals;Stay upright 30-60 min after meals Oral care recommendations: Oral care BID (2x/day);Staff/trained caregiver to provide oral care     [place] Jowell Bossi Clapp, MS, CCC-SLP Speech Language Pathologist Rehab Services; [facility] [phone] (ascom)   [place] J Clapp 11/11/2023,12:16 PM

## 2023-11-11 NOTE — Plan of Care (Signed)

## 2023-11-11 NOTE — Plan of Care (Signed)
  Problem: Fluid Volume: Goal: Hemodynamic stability will improve Outcome: Progressing   Problem: Coping: Goal: Ability to adjust to condition or change in health will improve Outcome: Progressing   Problem: Health Behavior/Discharge Planning: Goal: Ability to manage health-related needs will improve Outcome: Progressing   Problem: Coping: Goal: Level of anxiety will decrease Outcome: Progressing   Problem: Safety: Goal: Ability to remain free from injury will improve Outcome: Progressing

## 2023-11-11 NOTE — Consult Note (Signed)
 PHARMACY CONSULT NOTE - ELECTROLYTES  Pharmacy Consult for Electrolyte Monitoring and Replacement   Recent Labs: Height: 5' 8 (172.7 cm) Weight: 66 kg (145 lb 8.1 oz) IBW/kg (Calculated) : 63.9 Estimated Creatinine Clearance: 53.8 mL/min (A) (by C-G formula based on SCr of 1.08 mg/dL (H)). Potassium (mmol/L)  Date Value  11/11/2023 3.9  11/16/2013 4.0   Magnesium  (mg/dL)  Date Value  90/77/7974 2.2   Calcium  (mg/dL)  Date Value  90/77/7974 7.3 (L)   Calcium , Total (mg/dL)  Date Value  90/71/7984 7.9 (L)   Albumin  (g/dL)  Date Value  90/79/7974 1.8 (L)  07/06/2015 4.2  10/07/2012 2.9 (L)   Phosphorus (mg/dL)  Date Value  90/79/7974 2.7   Sodium (mmol/L)  Date Value  11/11/2023 135  07/06/2015 146 (H)  11/16/2013 131 (L)   Assessment  Tricia [name] is a 63 y.o. female presenting with weakness and leg swelling. PMH significant for anxiety, osteoarthritis, asthma, COPD, depression, type 2 diabetes mellitus, GERD, dyslipidemia, and hypertension. Pharmacy has been consulted to monitor and replace electrolytes.  Diet: dysphagia 3 diet MIVF: N/A Pertinent medications: N/A  Goal of Therapy: Electrolytes WNL  Plan:  --No electrolyte replacement currently warranted --Check BMP next with tomorrow AM labs  Thank you for involving pharmacy in this patient's care.    Will M. Lenon, PharmD, BCPS Clinical Pharmacist 11/11/2023 8:25 AM

## 2023-11-11 NOTE — TOC CM/SW Note (Signed)
To Whom It May Concern:  Please be advised that the above-named patient will require a short-term nursing home stay - anticipated 30 days or less for rehabilitation and strengthening.  The plan is for return home.  

## 2023-11-12 ENCOUNTER — Inpatient Hospital Stay

## 2023-11-12 DIAGNOSIS — J690 Pneumonitis due to inhalation of food and vomit: Secondary | ICD-10-CM | POA: Diagnosis not present

## 2023-11-12 DIAGNOSIS — J90 Pleural effusion, not elsewhere classified: Secondary | ICD-10-CM | POA: Diagnosis not present

## 2023-11-12 LAB — MAGNESIUM: Magnesium: 1.8 mg/dL (ref 1.7–2.4)

## 2023-11-12 LAB — CBC
HCT: 28.8 % — ABNORMAL LOW (ref 36.0–46.0)
Hemoglobin: 9.4 g/dL — ABNORMAL LOW (ref 12.0–15.0)
MCH: 31.8 pg (ref 26.0–34.0)
MCHC: 32.6 g/dL (ref 30.0–36.0)
MCV: 97.3 fL (ref 80.0–100.0)
Platelets: 480 K/uL — ABNORMAL HIGH (ref 150–400)
RBC: 2.96 MIL/uL — ABNORMAL LOW (ref 3.87–5.11)
RDW: 16 % — ABNORMAL HIGH (ref 11.5–15.5)
WBC: 9.8 K/uL (ref 4.0–10.5)
nRBC: 0 % (ref 0.0–0.2)

## 2023-11-12 LAB — BASIC METABOLIC PANEL WITH GFR
Anion gap: 10 (ref 5–15)
BUN: 14 mg/dL (ref 8–23)
CO2: 23 mmol/L (ref 22–32)
Calcium: 7.4 mg/dL — ABNORMAL LOW (ref 8.9–10.3)
Chloride: 104 mmol/L (ref 98–111)
Creatinine, Ser: 1.24 mg/dL — ABNORMAL HIGH (ref 0.44–1.00)
GFR, Estimated: 49 mL/min — ABNORMAL LOW (ref >60)
Glucose, Bld: 87 mg/dL (ref 70–99)
Potassium: 3.4 mmol/L — ABNORMAL LOW (ref 3.5–5.1)
Sodium: 137 mmol/L (ref 135–145)

## 2023-11-12 LAB — BODY FLUID CELL COUNT WITH DIFFERENTIAL
Eos, Fluid: 0 %
Lymphs, Fluid: 22 %
Monocyte-Macrophage-Serous Fluid: 20 %
Neutrophil Count, Fluid: 58 %
Total Nucleated Cell Count, Fluid: 1624 uL

## 2023-11-12 LAB — GLUCOSE, PLEURAL OR PERITONEAL FLUID: Glucose, Fluid: 118 mg/dL

## 2023-11-12 LAB — LACTATE DEHYDROGENASE, PLEURAL OR PERITONEAL FLUID: LD, Fluid: 247 U/L — ABNORMAL HIGH (ref 3–23)

## 2023-11-12 LAB — PROTEIN, PLEURAL OR PERITONEAL FLUID: Total protein, fluid: <3 g/dL

## 2023-11-12 LAB — GLUCOSE, CAPILLARY
Glucose-Capillary: 84 mg/dL (ref 70–99)
Glucose-Capillary: 162 mg/dL — ABNORMAL HIGH (ref 70–99)
Glucose-Capillary: 160 mg/dL — ABNORMAL HIGH (ref 70–99)

## 2023-11-12 MED ORDER — DOXYCYCLINE HYCLATE 100 MG PO TABS
100.0000 mg | ORAL_TABLET | Freq: Two times a day (BID) | ORAL | Status: DC
  Administered 2023-11-12 – 2023-11-15 (×6): 100 mg via ORAL
  Filled 2023-11-12 (×6): qty 1

## 2023-11-12 MED ORDER — LIDOCAINE HCL (PF) 1 % IJ SOLN
10.0000 mL | Freq: Once | INTRAMUSCULAR | Status: AC
  Administered 2023-11-12: 10 mL via INTRADERMAL
  Filled 2023-11-12: qty 10

## 2023-11-12 MED ORDER — POTASSIUM CHLORIDE 20 MEQ PO PACK
40.0000 meq | PACK | Freq: Once | ORAL | Status: AC
  Administered 2023-11-12: 40 meq via ORAL
  Filled 2023-11-12: qty 2

## 2023-11-12 MED ORDER — MAGNESIUM SULFATE 2 GM/50ML IV SOLN
2.0000 g | Freq: Once | INTRAVENOUS | Status: AC
Start: 2023-11-12 — End: 2023-11-12
  Administered 2023-11-12: 2 g via INTRAVENOUS
  Filled 2023-11-12: qty 50

## 2023-11-12 MED ORDER — TORSEMIDE 20 MG PO TABS
20.0000 mg | ORAL_TABLET | Freq: Every day | ORAL | Status: DC
Start: 2023-11-13 — End: 2023-11-17
  Administered 2023-11-13 – 2023-11-17 (×5): 20 mg via ORAL
  Filled 2023-11-12 (×5): qty 1

## 2023-11-12 NOTE — Progress Notes (Signed)
 PROGRESS NOTE Tricia [name]    DOB: 27-Apr-1960, 63 y.o.  FMW:[id number]    Code Status: Full Code   DOA: 10/30/2023   LOS: 13  Brief hospital course  Tricia [name] is a 63 y.o. female with a PMH significant for anxiety, osteoarthritis, asthma, COPD, depression, type 2 diabetes mellitus, GERD, dyslipidemia, and HTN, who presented to the emergency room with acute onset of generalized weakness and bilateral lower extremity swelling after being found down after several hours.   ED Course: BP was 109/53 with heart rate of 114 and later BP was 96/52 and temperature was 99.8, respiratory rate was 22. Labs reveal hyponatremia 128 and hyperkalemia of 6 with a CO2 of 13 and glucose of 126, BUN of 42 and creatinine 3.64 calcium  of 7.8 and albumin  2 with total protein 5.7.  BNP was 78.5 and high-sensitivity troponin I was 14.  Lactic acid was 2.6 and CBC showed hemoglobin 11.8 and hematocrit 36.9.  Respiratory panel came back negative.  Blood cultures were drawn. EKG: sinus tachycardia with rate 118 with poor R wave progression. Imaging: Portable chest x-ray showed emphysema with no acute cardiopulmonary disease. The patient was given IV vancomycin  and  IV cefepime .  On initial evaluation- patient was lethargic and minimally responsive. BP low and not responding to IV fluids or increase in midodrine . CCM was consulted who took over care for patient who required vasopressors. Weaned from them 9/14. NG tube was placed for nutrition as she was unable to tolerate PO while incapacitated.  9/15: transferred back to [facility]  11/12/23 -patient BP stable, will mean midodrine . PO intake is low but consistent, will dc NG tube and add PO supplements. Renal function improved- remove tunneled cath as she is not likely to need HD. Respiratory status- improved back to baseline O2 use. Mobility is greatly reduced- she likely needs SNF but needs to be reevaluated by PT/   Assessment & Plan  Principal Problem:    Sepsis due to cellulitis [facility]) Active Problems:   AKI (acute kidney injury)   Hyperkalemia   Asthma, chronic   Hyponatremia   Parkinson's disease (HCC)   GERD without esophagitis   Dyslipidemia   Gout   Sepsis (HCC)   Leg swelling   Atypical chest pain Improved this a.m.  Possibly due to pleural effusion and pneumonia.  She remains on room air.  She underwent thoracentesis with approximately 1 L of fluid removed.  Calculate lights criteria in the a.m.  But likely transudative. -Continue to treat for possible pneumonia. -Continue diuretic   HFpEF Patient would like to transition to torsemide . Not currently on GDMT due to low normal blood pressures Can initiate as able   Possible aspiration PNA Underwent MBSS with speech, with microaspiration with no further events.  Continue aspiration precautions with regular diet  Continue Augmentin  for 5-day course   Severe calorie malnutrition  Patient tolerating PO well.  - Continue meal supplements - Diet advanced to regular  Chronic hypoxic respiratory failure-reportedly on 2 L chronically at home.  She is weaned to room air here  Depression- continue home duloxetine , seroquel    L foot ulceration plantar surface Noted by nursing team. Some purulent material expressed. MRI without evidence of osteomyelitis. Podiatry consulted will await recommendations.  -Continue wound care  -F/u wound cx -Start doxycycline  given purulence    AKI  Acute renal failure Resolving- Cr significantly increased from baseline up to 3.64 on presentation. Likely due to ATN.  Mostly stable.  Continue to avoid  nephrotoxic agents    Latest Ref Rng & Units 11/12/2023    5:09 AM 11/11/2023    5:04 AM 11/10/2023    5:15 AM  BMP  Glucose 70 - 99 mg/dL 87  97  94   BUN 8 - 23 mg/dL 14  14  14    Creatinine 0.44 - 1.00 mg/dL 8.75  8.91  8.88   Sodium 135 - 145 mmol/L 137  135  135   Potassium 3.5 - 5.1 mmol/L 3.4  3.9  3.2   Chloride 98 - 111 mmol/L 104   102  102   CO2 22 - 32 mmol/L 23  26  26    Calcium  8.9 - 10.3 mg/dL 7.4  7.3  7.1    Hypokalemia Resolved  Diabetes- diet controlled. A1c 5.1.   Normocytic anemia  Will continue to check periodically.  Ferritin 255 B12 nl 1 month ago   Thrombocytopenia  Resolved. Nadir 57k. Unclear etiology. Likely due to infection.  Lovenox  was resumed  Asthma, chronic - Not in exacerbation. continue theophylline , Singulair , and bronchodilator inhaler.   GERD without esophagitis -continue PPI therapy.   Parkinson's disease (HCC) - continue Sinemet  CR and Sinemet  IR.   Dyslipidemia- not on statin   Gout- not acutely exacerbated   Peripheral neuralgia - continue gabapentin , Increased gabapentin  now that renal function continues to improve.   Vitamin D  deficiency  Continue vit D supplementation   Body mass index is 22.12 kg/m.  VTE ppx: Place TED hose Start: 11/09/23 1904 enoxaparin  (LOVENOX ) injection 40 mg Start: 11/09/23 1000  Diet:     Diet   Diet regular Fluid consistency: Thin   Consultants: CCM Nephrology   Subjective 11/12/23    Issues overnight continues to have some right chest wall discomfort with deep breaths.   Objective      11/12/2023    3:01 PM 11/12/2023   11:00 AM 11/12/2023   10:48 AM  Vitals with BMI  Systolic 96 98 98  Diastolic 51 52 51  Pulse 100 98 98     Intake/Output Summary (Last 24 hours) at 11/12/2023 1856 Last data filed at 11/12/2023 1500 Gross per 24 hour  Intake 300 ml  Output 600 ml  Net -300 ml    Filed Weights   11/10/23 0500 11/11/23 0544 11/12/23 0500  Weight: 67 kg 66 kg 66 kg       Physical Exam  Constitutional: In no distress.  Thin Cardiovascular: Normal rate, regular rhythm. No lower extremity edema  Pulmonary: Non labored breathing on room air, no wheezing or rales.   Abdominal: Soft. Non distended and non tender Musculoskeletal: Normal range of motion.     Neurological: Alert and oriented to person, place,  and time. Non focal  Skin: Skin is warm and dry. BLE bandaged c/d/I  Dorsum of R foot (see media tab for photos) Ulceration noted, no surrounding erythema, purulent drainage expressed.      Labs   I have personally reviewed the following labs and imaging studies CBC    Component Value Date/Time   WBC 9.8 11/12/2023 0953   RBC 2.96 (L) 11/12/2023 0953   HGB 9.4 (L) 11/12/2023 0953   HGB 13.7 11/16/2013 2040   HCT 28.8 (L) 11/12/2023 0953   HCT 40.9 11/16/2013 2040   PLT 480 (H) 11/12/2023 0953   PLT 254 11/16/2013 2040   MCV 97.3 11/12/2023 0953   MCV 94 11/16/2013 2040   MCH 31.8 11/12/2023 0953   MCHC 32.6 11/12/2023 0953  RDW 16.0 (H) 11/12/2023 0953   RDW 14.0 11/16/2013 2040   LYMPHSABS 0.4 (L) 11/02/2023 1215   LYMPHSABS 2.7 11/16/2013 2040   MONOABS 0.2 11/02/2023 1215   MONOABS 0.6 11/16/2013 2040   EOSABS 0.0 11/02/2023 1215   EOSABS 0.1 11/16/2013 2040   BASOSABS 0.0 11/02/2023 1215   BASOSABS 0.1 11/16/2013 2040   BASOSABS 0 11/15/2011 1430      Latest Ref Rng & Units 11/12/2023    5:09 AM 11/11/2023    5:04 AM 11/10/2023    5:15 AM  BMP  Glucose 70 - 99 mg/dL 87  97  94   BUN 8 - 23 mg/dL 14  14  14    Creatinine 0.44 - 1.00 mg/dL 8.75  8.91  8.88   Sodium 135 - 145 mmol/L 137  135  135   Potassium 3.5 - 5.1 mmol/L 3.4  3.9  3.2   Chloride 98 - 111 mmol/L 104  102  102   CO2 22 - 32 mmol/L 23  26  26    Calcium  8.9 - 10.3 mg/dL 7.4  7.3  7.1    MR FOOT RIGHT WO CONTRAST Result Date: 11/12/2023 CLINICAL DATA:  Plantar foot wound.  Evaluate for osteomyelitis. EXAM: MRI OF THE RIGHT FOREFOOT WITHOUT CONTRAST TECHNIQUE: Multiplanar, multisequence MR imaging of the right forefoot was performed. No intravenous contrast was administered. COMPARISON:  CT of the right lower leg and foot 10/31/2023. Remote right foot radiographs 09/10/2012. FINDINGS: Technical note: Despite efforts by the technologist and patient, mild motion artifact is present on today's exam and  could not be eliminated. This reduces exam sensitivity and specificity. Bones/Joint/Cartilage No evidence of acute fracture, dislocation or osteomyelitis. There is a stable old healed fracture of the 5th metatarsal shaft. Mild degenerative changes at the 1st metatarsophalangeal joint with chronic erosive changes medially in the 1st metatarsal head. Probable mild reactive marrow edema within the tibial sesamoid of the 1st metatarsal. No significant joint effusions. The alignment is normal at the Lisfranc joint. Ligaments Intact Lisfranc ligament. The collateral ligaments of the metatarsophalangeal joints appear intact. Muscles and Tendons Mild edema within the plantar forefoot musculature. No evidence of tendon tear or significant tenosynovitis. Soft tissues Soft tissue ulceration within the plantar aspect of the midfoot at the level of the Lisfranc joint. There is ill-defined T2 hyperintensity within the soft tissues superficial to the plantar fascia. No organized fluid collection is identified. There is generalized forefoot subcutaneous edema which may reflect cellulitis. IMPRESSION: 1. Soft tissue ulceration within the plantar aspect of the midfoot at the level of the Lisfranc joint with ill-defined T2 hyperintensity within the soft tissues superficial to the plantar fascia which may reflect cellulitis or phlegmon. No organized fluid collection identified. 2. No evidence of osteomyelitis. 3. Mild degenerative changes at the 1st metatarsophalangeal joint with chronic erosive changes medially in the 1st metatarsal head. 4. Stable old healed fracture of the 5th metatarsal shaft. Electronically Signed   By: Elsie Perone M.D.   On: 11/12/2023 15:26   DG Chest Port 1 View Result Date: 11/12/2023 CLINICAL DATA:  Chest discomfort.  Post thoracentesis. EXAM: PORTABLE CHEST 1 VIEW COMPARISON:  Radiographs 10/30/2023 and 09/22/2023.  CT 11/10/2023. FINDINGS: 1129 hours. The heart size and mediastinal contours are  stable with aortic atherosclerosis. Small right pleural effusion appears improved from previous CT. Mild patchy airspace opacities at both lung bases. No evidence of pneumothorax. The bones appear unchanged. IMPRESSION: No evidence of pneumothorax following thoracentesis. Small right pleural effusion  appears improved. Electronically Signed   By: Elsie Perone M.D.   On: 11/12/2023 12:08   US  THORACENTESIS ASP PLEURAL SPACE W/IMG GUIDE Result Date: 11/12/2023 INDICATION: 63 year old female admitted for sepsis with cellulitis, and found to have a moderate right pleural effusion. IR was requested for diagnostic and therapeutic right-sided thoracentesis. EXAM: ULTRASOUND GUIDED DIAGNOSTIC AND THERAPEUTIC RIGHT-SIDED THORACENTESIS MEDICATIONS: 5 cc of 1% lidocaine . COMPLICATIONS: None immediate. PROCEDURE: An ultrasound guided thoracentesis was thoroughly discussed with the patient and questions answered. The benefits, risks, alternatives and complications were also discussed. The patient understands and wishes to proceed with the procedure. Written consent was obtained. Ultrasound was performed to localize and mark an adequate pocket of fluid in the right chest. The area was then prepped and draped in the normal sterile fashion. 1% Lidocaine  was used for local anesthesia. Under ultrasound guidance a 6 Fr Safe-T-Centesis catheter was introduced. Thoracentesis was performed. The catheter was removed and a dressing applied. FINDINGS: A total of approximately 800 mL of clear, straw-colored pleural fluid was removed. Samples were sent to the laboratory as requested by the clinical team. IMPRESSION: Successful ultrasound guided left thoracentesis yielding 0.80 L of pleural fluid. Procedure performed by Carlin Griffon, PA-C Electronically Signed   By: Cordella Banner   On: 11/12/2023 11:27   DG Swallowing Func-Speech Pathology Result Date: 11/11/2023 Table formatting from the original result was not included. Modified  Barium Swallow Study Patient Details Name: LYNNITA SOMMA MRN: [id number] Date of Birth: 1960-07-16 Today's Date: 11/11/2023 HPI/PMH: HPI: CHAUNTELLE AZPEITIA is a 63 y.o. female who presented to ED  on 10/30/2023 with acute onset of generalizated weakness and BLE swelling (L>R) after being found on the ground for several hours covered in urine and feces. Pt with a PMH significant for anxiety, osteoarthritis, asthma, COPD, depression, type 2 diabetes mellitus, GERD, dyslipidemia, and hypertension. CXR 1. Emphysema and background scarring unchanged since prior study. No  acute airspace disease.  During hospitalization, pt was evaluated/treated by ST with diet advanced to dysphagia 3 with thin liquids. Pt received NGT placement for external nutrition. NGT was discharged on 11/08/2023. ST consulted for diet recommendation. Clinical Impression: Clinical Impression: Pt presents with minimal oropharyngeal dysphagia. Pharyngeal phase notable for min reduced laryngeal vestibule closure resultant in x1 occurrence of micro aspiration and shallow penetration (trace coating on the underside of the epiglottis) with thin liquid. Of note, micro aspiration occurring on first captured swallow and not repeated despite thorough testing. Shallow, trace penetration occurring inconsistently, and did not advance into laryngeal vestibule. Oral phase notable for impaired mastication, directly impacted by lack of natural dentition.     Overall, suspect that current presentation is likely close to baseline oropharyngeal swallow function. However, current outstanding factors (medical compromise, limited mobility/deconditioning) and baseline GI (GERD) and resp (COPD) components increase risk for aspiration and adverse events. Recommend continued regular solids and thin liquids with aspiration precautions (slow rate, small bites, elevated HOB, alert for PO intake). Emphasize oral care to reduce bacterial collection and mobility to aid  pulmonary clearance. No follow up SLP services indicated. Factors that may increase risk of adverse event in presence of aspiration Noe & Lianne 2021): Factors that may increase risk of adverse event in presence of aspiration Noe & Lianne 2021): Poor general health and/or compromised immunity; Respiratory or GI disease; Limited mobility; Frail or deconditioned Recommendations/Plan: Swallowing Evaluation Recommendations Swallowing Evaluation Recommendations Recommendations: PO diet PO Diet Recommendation: Regular; Thin liquids (Level 0) Liquid Administration via: Straw; Cup Medication Administration:  Whole meds with puree (vs liquid) Supervision: Patient able to self-feed; Intermittent supervision/cueing for swallowing strategies Swallowing strategies  : Minimize environmental distractions; Slow rate; Small bites/sips Postural changes: Position pt fully upright for meals; Stay upright 30-60 min after meals Oral care recommendations: Oral care BID (2x/day); Staff/trained caregiver to provide oral care Treatment Plan Treatment Plan Treatment recommendations: No treatment recommended at this time Follow-up recommendations: No SLP follow up Functional status assessment: Patient has not had a recent decline in their functional status. Recommendations Recommendations for follow up therapy are one component of a multi-disciplinary discharge planning process, led by the attending physician.  Recommendations may be updated based on patient status, additional functional criteria and insurance authorization. Assessment: Orofacial Exam: Orofacial Exam Oral Cavity: Oral Hygiene: WFL Oral Cavity - Dentition: Edentulous Orofacial Anatomy: WFL Oral Motor/Sensory Function: WFL Anatomy: Anatomy: WFL Boluses Administered: Boluses Administered Boluses Administered: Thin liquids (Level 0); Mildly thick liquids (Level 2, nectar thick); Moderately thick liquids (Level 3, honey thick); Puree; Solid  Oral Impairment Domain: Oral  Impairment Domain Lip Closure: No labial escape Tongue control during bolus hold: Cohesive bolus between tongue to palatal seal Bolus preparation/mastication: Disorganized chewing/mashing with solid pieces of bolus unchewed (baseline secondary to dentition) Bolus transport/lingual motion: Brisk tongue motion Oral residue: Trace residue lining oral structures Location of oral residue : Tongue; Floor of mouth Initiation of pharyngeal swallow : Pyriform sinuses  Pharyngeal Impairment Domain: Pharyngeal Impairment Domain Soft palate elevation: No bolus between soft palate (SP)/pharyngeal wall (PW) Laryngeal elevation: Complete superior movement of thyroid  cartilage with complete approximation of arytenoids to epiglottic petiole Anterior hyoid excursion: Complete anterior movement Epiglottic movement: Complete inversion Laryngeal vestibule closure: Incomplete, narrow column air/contrast in laryngeal vestibule Pharyngeal stripping wave : Present - complete Pharyngeal contraction (A/P view only): N/A Pharyngoesophageal segment opening: Complete distension and complete duration, no obstruction of flow Tongue base retraction: No contrast between tongue base and posterior pharyngeal wall (PPW) Pharyngeal residue: Trace residue within or on pharyngeal structures Location of pharyngeal residue: Valleculae  Esophageal Impairment Domain: Esophageal Impairment Domain Esophageal clearance upright position: Complete clearance, esophageal coating Pill: Pill Consistency administered: -- (n/a) Penetration/Aspiration Scale Score: Penetration/Aspiration Scale Score 1.  Material does not enter airway: Mildly thick liquids (Level 2, nectar thick); Moderately thick liquids (Level 3, honey thick); Puree; Solid 3.  Material enters airway, remains ABOVE vocal cords and not ejected out: Thin liquids (Level 0) 8.  Material enters airway, passes BELOW cords without attempt by patient to eject out (silent aspiration) : Thin liquids (Level 0) (x1  instance of micro aspiration) Compensatory Strategies: Compensatory Strategies Compensatory strategies: No   General Information: Caregiver present: No  Diet Prior to this Study: Regular; Thin liquids (Level 0)   Temperature : Normal   Respiratory Status: WFL   Supplemental O2: None (Room air)   History of Recent Intubation: No  Behavior/Cognition: Alert; Cooperative; Pleasant mood Self-Feeding Abilities: Able to self-feed Baseline vocal quality/speech: Normal Volitional Cough: Able to elicit Volitional Swallow: Able to elicit Exam Limitations: No limitations Goal Planning: No data recorded No data recorded No data recorded Patient/Family Stated Goal: none present Consulted and agree with results and recommendations: Patient; Physician; Nurse Pain: Pain Assessment Pain Assessment: 0-10 Pain Score: 8 Faces Pain Scale: 6 Pain Location: R foot neuropathy Pain Descriptors / Indicators: Pins and needles; Stabbing Pain Intervention(s): Limited activity within patient's tolerance; Monitored during session; Patient requesting pain meds-RN notified End of Session: Start Time:SLP Start Time (ACUTE ONLY): 1100 Stop Time: SLP Stop  Time (ACUTE ONLY): 1145 Time Calculation:SLP Time Calculation (min) (ACUTE ONLY): 45 min Charges: SLP Evaluations $ SLP Speech Visit: 1 Visit SLP Evaluations $BSS Swallow: 1 Procedure $MBS Swallow: 1 Procedure SLP visit diagnosis: SLP Visit Diagnosis: Dysphagia, oropharyngeal phase (R13.12) Past Medical History: Past Medical History: Diagnosis Date  Anxiety   Arthritis   joints and hands/ knees  Asthma   uses inhaler  Benign essential tremor   head  Cervical dystonia   neck pain  Cholesteatoma of left ear   x2  COPD (chronic obstructive pulmonary disease) (HCC)   Cough   Depression   Diabetes mellitus without complication (HCC)   type 2  Diastolic dysfunction   Dyspnea   Dysrhythmia   diastolic dysfunction  GERD (gastroesophageal reflux disease)   Headache   migraines/ one per week  HOH (hard of  hearing)   partially deaf left ear  Hyperlipidemia   Hypertension   Motion sickness   boat  Neuromuscular disorder (HCC)   neuropathy feet and hands( nerve damage)  Wears dentures   upper and lower Past Surgical History: Past Surgical History: Procedure Laterality Date  CARPAL TUNNEL RELEASE Bilateral   x2 right, 1x on left  COLONOSCOPY    COLONOSCOPY WITH PROPOFOL  N/A 04/25/2017  Procedure: COLONOSCOPY WITH PROPOFOL ;  Surgeon: Jinny Carmine, MD;  Location: [facility];  Service: Endoscopy;  Laterality: N/A;  diabetic-oral med  DILATION AND CURETTAGE OF UTERUS    ESOPHAGOGASTRODUODENOSCOPY (EGD) WITH PROPOFOL  N/A 01/10/2021  Procedure: ESOPHAGOGASTRODUODENOSCOPY (EGD) WITH PROPOFOL ;  Surgeon: Janalyn Keene NOVAK, MD;  Location: [facility];  Service: Endoscopy;  Laterality: N/A;  Diabetic  EXTERNAL EAR SURGERY Left   x2  POLYPECTOMY  04/25/2017  Procedure: POLYPECTOMY INTESTINAL;  Surgeon: Jinny Carmine, MD;  Location: [facility];  Service: Endoscopy;;  SPINE SURGERY    herniated disc  TUBAL LIGATION   [place] Jarrett Clapp, MS, CCC-SLP Speech Language Pathologist Rehab Services; [facility] [place] [phone] (ascom) [place] J Clapp 11/11/2023, 12:36 PM  CT Angio Chest Pulmonary Embolism (PE) W or WO Contrast Result Date: 11/10/2023 EXAM: CTA of the Chest with contrast for PE 11/10/2023 10:44:08 PM TECHNIQUE: CTA of the chest was performed after the administration of intravenous contrast. Multiplanar reformatted images are provided for review. MIP images are provided for review. Automated exposure control, iterative reconstruction, and/or weight based adjustment of the mA/kV was utilized to reduce the radiation dose to as low as reasonably achievable. COMPARISON: 09/22/2023 CLINICAL HISTORY: Pulmonary embolism (PE) suspected, low to intermediate prob, positive D-dimer. FINDINGS: PULMONARY ARTERIES: Pulmonary arteries are adequately opacified for evaluation. No pulmonary embolism. Main pulmonary  artery is normal in caliber. MEDIASTINUM: The heart and pericardium demonstrate no acute abnormality. Mild thoracic aortic atherosclerosis. There is no acute abnormality of the thoracic aorta. LYMPH NODES: Small mediastinal lymph nodes, including a dominant 12 mm short-axis subcarinal node, likely reactive. LUNGS AND PLEURA: Multifocal patchy/nodular opacities, right middle lobe and lingular predominant, suggesting multifocal pneumonia, possibly on the basis of aspiration. Moderate right pleural effusion with associated compressive atelectasis in the right lower lobe. Trace left pleural effusion. UPPER ABDOMEN: Layering gallstones, without associated inflammatory changes. Mild-to-moderate bilateral hydronephrosis, chronic, improved. Moderate upper abdominal ascites, progressive. Mild hepatic steatosis. SOFT TISSUES AND BONES: No acute bone or soft tissue abnormality. IMPRESSION: 1. No evidence of pulmonary embolism. 2. Multifocal pneumonia, possibly on the basis of aspiration, as above. 3. Moderate right pleural effusion. Trace left pleural effusion. 4. Additional ancillary findings as above. Electronically signed by: Pinkie  Verdene MD 11/10/2023 10:49 PM EDT RP Workstation: [id number]    Disposition Plan & Communication  Patient status: Inpatient  Admitted From: Home Planned disposition location: SNF Anticipated discharge date: TBD pending clinical development   Family Communication: None at bedside    Author: Alban Pepper, MD [facility] 11/12/2023, 6:56 PM   Available by Epic secure chat 7AM-7PM. If 7PM-7AM, please contact night-coverage.  [facility] contact information found on [email or web address].

## 2023-11-12 NOTE — Plan of Care (Signed)
  Problem: Clinical Measurements: Goal: Signs and symptoms of infection will decrease Outcome: Progressing   Problem: Respiratory: Goal: Ability to maintain adequate ventilation will improve Outcome: Progressing   Problem: Coping: Goal: Ability to adjust to condition or change in health will improve Outcome: Progressing   

## 2023-11-12 NOTE — Plan of Care (Signed)
   Problem: Education: Goal: Knowledge of General Education information will improve Description Including pain rating scale, medication(s)/side effects and non-pharmacologic comfort measures Outcome: Progressing

## 2023-11-12 NOTE — Procedures (Signed)
 PROCEDURE SUMMARY:  Successful image-guided right-sided diagnostic and therapeutic thoracentesis. Yielded 0.80 liters of clear, straw-colored pleural fluid. Patient tolerated procedure well. EBL: Zero No immediate complications.  Specimen was sent for labs. Post procedure CXR currently pending.  Please see imaging section of Epic for full dictation.  Car[facility] LABOR Darenda Fike PA-C 11/12/2023 10:52 AM

## 2023-11-12 NOTE — Hospital Course (Addendum)
 63 y.o. female with a PMH significant for anxiety, osteoarthritis, asthma, COPD, depression, type 2 diabetes mellitus, GERD, dyslipidemia, and HTN, who presented to the emergency room with acute onset of generalized weakness and bilateral lower extremity swelling after being found down after several hours.   ED Course: BP was 109/53 with heart rate of 114 and later BP was 96/52 and temperature was 99.8, respiratory rate was 22. Labs reveal hyponatremia 128 and hyperkalemia of 6 with a CO2 of 13 and glucose of 126, BUN of 42 and creatinine 3.64 calcium  of 7.8 and albumin  2 with total protein 5.7.  BNP was 78.5 and high-sensitivity troponin I was 14.  Lactic acid was 2.6 and CBC showed hemoglobin 11.8 and hematocrit 36.9.  Respiratory panel came back negative.  Blood cultures were drawn. EKG: sinus tachycardia with rate 118 with poor R wave progression. Imaging: Portable chest x-ray showed emphysema with no acute cardiopulmonary disease. The patient was given IV vancomycin  and  IV cefepime .  On initial evaluation- patient was lethargic and minimally responsive. BP low and not responding to IV fluids or increase in midodrine . CCM was consulted who took over care for patient who required vasopressors. Weaned from them 9/14. NG tube was placed for nutrition as she was unable to tolerate PO while incapacitated.  9/10.  Blood cultures positive for Serratia. 9/15: transferred back to [facility]   11/12/23 -patient BP stable, will wean midodrine . PO intake is low but consistent, will dc NG tube and add PO supplements.  Respiratory status- improved back to baseline O2 use. 9/24.  I was able to squeeze out some pus from her right foot.  MRI did not show any evidence of osteomyelitis and no organized fluid collection identified.  Case discussed with podiatry and he would like to take to the operating room for washout procedure.  MRI of the left foot ordered because he does not like the way a toe on the left foot looks.   Stopped Augmentin  and started Rocephin  9/25.  Dr. Lennie podiatry took to the operating room for right foot incision and drainage of right midfoot abscess, right foot wound debridement, left foot wound debridement and left second toe amputation 9/26.  Serratia growing out of wound culture.  Case discussed with infectious disease specialist and can continue Rocephin  while here and then likely switch over to Cipro  for around another week upon discharge. 9/27.  Patient still had a lot of pain this morning was on IV morphine .  Spoke with nursing staff and patient about using oral medications and only using IV if absolutely necessary.  Unable to do IV medications at the rehab. 9/28.  Patient's pain under better control with oral pain.  Will discharge to rehab facility.  Will give IV Rocephin  today prior to going and switch over to oral Cipro  for 9 more days upon discharge.

## 2023-11-12 NOTE — Consult Note (Signed)
 PHARMACY CONSULT NOTE - ELECTROLYTES  Pharmacy Consult for Electrolyte Monitoring and Replacement   Recent Labs: Height: 5' 8 (172.7 cm) Weight: 66 kg (145 lb 8.1 oz) IBW/kg (Calculated) : 63.9 Estimated Creatinine Clearance: 46.8 mL/min (A) (by C-G formula based on SCr of 1.24 mg/dL (H)). Potassium (mmol/L)  Date Value  11/12/2023 3.4 (L)  11/16/2013 4.0   Magnesium  (mg/dL)  Date Value  90/76/7974 1.8   Calcium  (mg/dL)  Date Value  90/76/7974 7.4 (L)   Calcium , Total (mg/dL)  Date Value  90/71/7984 7.9 (L)   Albumin  (g/dL)  Date Value  90/79/7974 1.8 (L)  07/06/2015 4.2  10/07/2012 2.9 (L)   Phosphorus (mg/dL)  Date Value  90/79/7974 2.7   Sodium (mmol/L)  Date Value  11/12/2023 137  07/06/2015 146 (H)  11/16/2013 131 (L)   Assessment  Tricia [name] is a 63 y.o. female presenting with weakness and leg swelling. PMH significant for anxiety, osteoarthritis, asthma, COPD, depression, type 2 diabetes mellitus, GERD, dyslipidemia, and hypertension. Pharmacy has been consulted to monitor and replace electrolytes.  Diet: dysphagia 3 diet MIVF: N/A Pertinent medications: N/A  Goal of Therapy: Electrolytes WNL  Plan:  --K 3.4 >> potassium chloride  40 mEq PO x 1 --Mg 1.8 >> magnesium  sulfate 2g IV x 1 --Check BMP next with tomorrow AM labs  Thank you for involving pharmacy in this patient's care.   Will M. Lenon, PharmD, BCPS Clinical Pharmacist 11/12/2023 7:58 AM

## 2023-11-13 ENCOUNTER — Inpatient Hospital Stay

## 2023-11-13 DIAGNOSIS — E871 Hypo-osmolality and hyponatremia: Secondary | ICD-10-CM

## 2023-11-13 DIAGNOSIS — R710 Precipitous drop in hematocrit: Secondary | ICD-10-CM | POA: Diagnosis not present

## 2023-11-13 DIAGNOSIS — L97522 Non-pressure chronic ulcer of other part of left foot with fat layer exposed: Secondary | ICD-10-CM | POA: Diagnosis not present

## 2023-11-13 DIAGNOSIS — L97509 Non-pressure chronic ulcer of other part of unspecified foot with unspecified severity: Secondary | ICD-10-CM

## 2023-11-13 DIAGNOSIS — L0390 Cellulitis, unspecified: Secondary | ICD-10-CM | POA: Diagnosis not present

## 2023-11-13 DIAGNOSIS — J90 Pleural effusion, not elsewhere classified: Secondary | ICD-10-CM

## 2023-11-13 DIAGNOSIS — A419 Sepsis, unspecified organism: Secondary | ICD-10-CM | POA: Diagnosis not present

## 2023-11-13 LAB — CBC
HCT: 23 % — ABNORMAL LOW (ref 36.0–46.0)
Hemoglobin: 7.5 g/dL — ABNORMAL LOW (ref 12.0–15.0)
MCH: 31.9 pg (ref 26.0–34.0)
MCHC: 32.6 g/dL (ref 30.0–36.0)
MCV: 97.9 fL (ref 80.0–100.0)
Platelets: 445 K/uL — ABNORMAL HIGH (ref 150–400)
RBC: 2.35 MIL/uL — ABNORMAL LOW (ref 3.87–5.11)
RDW: 16.3 % — ABNORMAL HIGH (ref 11.5–15.5)
WBC: 7.6 K/uL (ref 4.0–10.5)
nRBC: 0 % (ref 0.0–0.2)

## 2023-11-13 LAB — COMPREHENSIVE METABOLIC PANEL WITH GFR
ALT: 5 U/L (ref 0–44)
AST: 25 U/L (ref 15–41)
Albumin: 1.7 g/dL — ABNORMAL LOW (ref 3.5–5.0)
Alkaline Phosphatase: 88 U/L (ref 38–126)
Anion gap: 6 (ref 5–15)
BUN: 14 mg/dL (ref 8–23)
CO2: 24 mmol/L (ref 22–32)
Calcium: 7.2 mg/dL — ABNORMAL LOW (ref 8.9–10.3)
Chloride: 104 mmol/L (ref 98–111)
Creatinine, Ser: 0.94 mg/dL (ref 0.44–1.00)
GFR, Estimated: >60 mL/min (ref >60)
Glucose, Bld: 88 mg/dL (ref 70–99)
Potassium: 3.4 mmol/L — ABNORMAL LOW (ref 3.5–5.1)
Sodium: 134 mmol/L — ABNORMAL LOW (ref 135–145)
Total Bilirubin: 0.4 mg/dL (ref 0.0–1.2)
Total Protein: 4.8 g/dL — ABNORMAL LOW (ref 6.5–8.1)

## 2023-11-13 LAB — GLUCOSE, CAPILLARY
Glucose-Capillary: 90 mg/dL (ref 70–99)
Glucose-Capillary: 104 mg/dL — ABNORMAL HIGH (ref 70–99)
Glucose-Capillary: 150 mg/dL — ABNORMAL HIGH (ref 70–99)
Glucose-Capillary: 163 mg/dL — ABNORMAL HIGH (ref 70–99)

## 2023-11-13 LAB — LACTATE DEHYDROGENASE: LDH: 149 U/L (ref 98–192)

## 2023-11-13 LAB — PATHOLOGIST SMEAR REVIEW

## 2023-11-13 MED ORDER — POTASSIUM CHLORIDE CRYS ER 20 MEQ PO TBCR
40.0000 meq | EXTENDED_RELEASE_TABLET | ORAL | Status: DC
  Administered 2023-11-13: 40 meq via ORAL
  Filled 2023-11-13: qty 2

## 2023-11-13 MED ORDER — SODIUM CHLORIDE 0.9 % IV SOLN
2.0000 g | INTRAVENOUS | Status: DC
  Administered 2023-11-13 – 2023-11-17 (×5): 2 g via INTRAVENOUS
  Filled 2023-11-13 (×6): qty 20

## 2023-11-13 MED ORDER — POTASSIUM CHLORIDE CRYS ER 20 MEQ PO TBCR: 40.0000 meq | EXTENDED_RELEASE_TABLET | Freq: Once | ORAL | Status: DC

## 2023-11-13 NOTE — Plan of Care (Signed)
  Problem: Clinical Measurements: Goal: Diagnostic test results will improve Outcome: Progressing Goal: Signs and symptoms of infection will decrease Outcome: Progressing   Problem: Respiratory: Goal: Ability to maintain adequate ventilation will improve Outcome: Progressing   Problem: Fluid Volume: Goal: Ability to maintain a balanced intake and output will improve Outcome: Progressing   Problem: Health Behavior/Discharge Planning: Goal: Ability to identify and utilize available resources and services will improve Outcome: Progressing   Problem: Metabolic: Goal: Ability to maintain appropriate glucose levels will improve Outcome: Progressing   Problem: Nutritional: Goal: Maintenance of adequate nutrition will improve Outcome: Progressing   Problem: Tissue Perfusion: Goal: Adequacy of tissue perfusion will improve Outcome: Progressing   Problem: Clinical Measurements: Goal: Will remain free from infection Outcome: Progressing   Problem: Coping: Goal: Level of anxiety will decrease Outcome: Progressing   Problem: Pain Managment: Goal: General experience of comfort will improve and/or be controlled Outcome: Progressing   Problem: Safety: Goal: Ability to remain free from injury will improve Outcome: Progressing   Problem: Skin Integrity: Goal: Risk for impaired skin integrity will decrease Outcome: Progressing

## 2023-11-13 NOTE — Progress Notes (Signed)
 Progress Note   Patient: EZMERALDA STEFANICK FMW:[id number] DOB: 1960-11-23 DOA: 10/30/2023     14 DOS: the patient was seen and examined on 11/13/2023   Brief hospital course: 63 y.o. female with a PMH significant for anxiety, osteoarthritis, asthma, COPD, depression, type 2 diabetes mellitus, GERD, dyslipidemia, and HTN, who presented to the emergency room with acute onset of generalized weakness and bilateral lower extremity swelling after being found down after several hours.   ED Course: BP was 109/53 with heart rate of 114 and later BP was 96/52 and temperature was 99.8, respiratory rate was 22. Labs reveal hyponatremia 128 and hyperkalemia of 6 with a CO2 of 13 and glucose of 126, BUN of 42 and creatinine 3.64 calcium  of 7.8 and albumin  2 with total protein 5.7.  BNP was 78.5 and high-sensitivity troponin I was 14.  Lactic acid was 2.6 and CBC showed hemoglobin 11.8 and hematocrit 36.9.  Respiratory panel came back negative.  Blood cultures were drawn. EKG: sinus tachycardia with rate 118 with poor R wave progression. Imaging: Portable chest x-ray showed emphysema with no acute cardiopulmonary disease. The patient was given IV vancomycin  and  IV cefepime .  On initial evaluation- patient was lethargic and minimally responsive. BP low and not responding to IV fluids or increase in midodrine . CCM was consulted who took over care for patient who required vasopressors. Weaned from them 9/14. NG tube was placed for nutrition as she was unable to tolerate PO while incapacitated.  9/15: transferred back to [facility]   11/12/23 -patient BP stable, will mean midodrine . PO intake is low but consistent, will dc NG tube and add PO supplements. Renal function improved- remove tunneled cath as she is not likely to need HD. Respiratory status- improved back to baseline O2 use. 9/24.  I was able to squeeze out some pus from her right foot.  MRI did not show any evidence of osteomyelitis and no organized fluid  collection identified.  Case discussed with podiatry and he would like to take to the operating room for washout procedure.  MRI of the left foot ordered because he does not like the way a toe on the left foot looks.  Assessment and Plan: * Sepsis due to cellulitis [facility]) Present on admission.  Today has pus coming from the right foot.  MRI does not show osteomyelitis or an abscess but podiatry believes that this needs incision and drainage in the operating room tomorrow.  Changed Augmentin  over to Rocephin  and continue doxycycline .  Toe ulcer (HCC) Second toe left foot suspected osteomyelitis.  Podiatry ordered an MRI.  Drop in hemoglobin Today's hemoglobin down to 7.5.  Recheck tomorrow with type and cross.  May end up needing a blood transfusion.  No signs of bleeding.  Did have a thoracentesis.  Ferritin elevated going along with anemia of chronic disease  Pleural effusion on right Interventional radiology drew off 800 mL of fluid on 9/23.  AKI (acute kidney injury) Creatinine 3.64 on presentation and down to 0.94.  GERD without esophagitis Continue PPI therapy.  Parkinson's disease (HCC) - Will continue Sinemet  CR and Sinemet  IR.  Dyslipidemia - Will continue statin therapy.  Chronic diastolic CHF (congestive heart failure) (HCC) On low-dose torsemide   Hyponatremia Last sodium 134  Hyperkalemia Resolved after acute kidney injury improved.  Now with hypokalemia.  Asthma, chronic Will continue theophylline , Singulair , and his bronchodilator inhaler.  Protein-calorie malnutrition, severe Continue supplements  Gout Continue colchicine .       Subjective: Patient feeling okay.  States she has been unable to walk for a while.  Initially came in with leg swelling and weakness.  Physical Exam: Vitals:   11/13/23 0152 11/13/23 0154 11/13/23 0332 11/13/23 0807  BP: 120/65  120/70 133/67  Pulse: 94  91 85  Resp: 16  16 18   Temp: 98.3 F (36.8 C)  98.1 F (36.7 C)  97.8 F (36.6 C)  TempSrc: Oral  Oral   SpO2: 93%  95% 95%  Weight:  66.2 kg    Height:       Physical Exam HENT:     Head: Normocephalic.  Eyes:     General: Lids are normal.     Conjunctiva/sclera: Conjunctivae normal.  Cardiovascular:     Rate and Rhythm: Normal rate and regular rhythm.     Heart sounds: Normal heart sounds, S1 normal and S2 normal.  Pulmonary:     Breath sounds: No decreased breath sounds, wheezing, rhonchi or rales.  Abdominal:     Palpations: Abdomen is soft.     Tenderness: There is no abdominal tenderness.  Musculoskeletal:     Right lower leg: Swelling present.     Left lower leg: Swelling present.  Skin:    General: Skin is warm.     Comments: Able to squeeze out some pus under her right foot.  Neurological:     Mental Status: She is alert and oriented to person, place, and time.     Data Reviewed: Sodium 134, potassium 3.4, creatinine 0.94, white blood cell count 7.6, hemoglobin 7.5, platelet count 445  Family Communication: Spoke with son-in-law on the phone  Disposition: Status is: Inpatient Remains inpatient appropriate because: Podiatry plans on taking to the operating room tomorrow, change Augmentin  over to Rocephin .  Patient also on doxycycline .  Planned Discharge Destination: Rehab    Time spent: 28 minutes  Author: Charlie Patterson, MD 11/13/2023 1:55 PM  For on call review [email or web address].

## 2023-11-13 NOTE — Assessment & Plan Note (Addendum)
 Last hemoglobin down to 7.9.   No signs of bleeding.  Did have a thoracentesis.  Ferritin elevated going along with anemia of chronic disease.

## 2023-11-13 NOTE — Assessment & Plan Note (Signed)
 Second toe left foot suspected osteomyelitis.  Podiatry ordered an MRI.

## 2023-11-13 NOTE — TOC Progression Note (Addendum)
 Transition of Care [facility]) - Progression Note    Patient Details  Name: Tricia [name] MRN: [id number] Date of Birth: Nov 25, 1960  Transition of [facility]) CM/SW Contact  Alvaro Louder, [place] Phone Number: 11/13/2023, 9:13 AM  Clinical Narrative:   Per MD patient is not medically ready. During the time of continued medical work-up the patients auth expired. [facility] will restart auth for patient to admit to [facility] when closer to medical readiness.   TOC to follow for discharge                    Expected Discharge Plan and Services                                               Social Drivers of Health (SDOH) Interventions SDOH Screenings   Food Insecurity: Patient Unable To Answer (10/31/2023)  Housing: Unknown (10/31/2023)  Transportation Needs: Patient Unable To Answer (10/31/2023)  Utilities: Patient Unable To Answer (10/31/2023)  Alcohol Screen: Low Risk  (03/07/2023)  Depression (PHQ2-9): High Risk (05/01/2023)  Financial Resource Strain: Patient Declined (06/18/2023)   Received from [facility]  Physical Activity: Sufficiently Active (03/07/2023)  Social Connections: Socially Isolated (08/29/2023)  Stress: No Stress Concern Present (03/07/2023)  Tobacco Use: High Risk (10/30/2023)  Health Literacy: Adequate Health Literacy (03/07/2023)    Readmission Risk Interventions    11/02/2023    1:21 PM  Readmission Risk Prevention Plan  Transportation Screening Complete  Medication Review (RN Care Manager) Complete  PCP or Specialist appointment within 3-5 days of discharge Complete  SW Recovery Care/Counseling Consult Complete  Palliative Care Screening Not Applicable  Skilled Nursing Facility Not Applicable

## 2023-11-13 NOTE — Assessment & Plan Note (Signed)
 Continue supplements

## 2023-11-13 NOTE — Consult Note (Signed)
 PODIATRY / FOOT AND ANKLE SURGERY CONSULTATION NOTE  Requesting Physician: Dr. Franchot  Reason for consult: R foot wound/abscess   HPI: Tricia [name] is a 63 y.o. female who presents with wounds present to bilateral lower extremities.  Podiatry was consulted more so for the wound to the right plantar midfoot.  Patient also has wounds to the right upper shin, left lower shin, and left second toe today.  Patient has had no workup for the left second toe but did have x-ray and MRI imaging performed to the right foot which showed potentially a small superficial abscess present to the midfoot.  MRI imaging did not show a discrete abscess but there appeared to be soft tissue hyper intensity within the soft tissues plantar to the plantar fascia which may reflect cellulitis or phlegmon.  Patient has been in the hospital for 2 weeks and podiatry was consulted just yesterday for these issues.  She has been getting dressing changes with Medihoney per wound care instructions.  She has chronic pain throughout her body but notes that mostly hurts in her feet and her back.  PMHx:  Past Medical History:  Diagnosis Date   Anxiety    Arthritis    joints and hands/ knees   Asthma    uses inhaler   Benign essential tremor    head   Cervical dystonia    neck pain   Cholesteatoma of left ear    x2   COPD (chronic obstructive pulmonary disease) (HCC)    Cough    Depression    Diabetes mellitus without complication (HCC)    type 2   Diastolic dysfunction    Dyspnea    Dysrhythmia    diastolic dysfunction   GERD (gastroesophageal reflux disease)    Headache    migraines/ one per week   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   Hyperlipidemia    Hypertension    Motion sickness    boat   Neuromuscular disorder (HCC)    neuropathy feet and hands( nerve damage)   Wears dentures    upper and lower    Surgical Hx:  Past Surgical History:  Procedure Laterality Date   CARPAL TUNNEL  RELEASE Bilateral    x2 right, 1x on left   COLONOSCOPY     COLONOSCOPY WITH PROPOFOL  N/A 04/25/2017   Procedure: COLONOSCOPY WITH PROPOFOL ;  Surgeon: Jinny Carmine, MD;  Location: [facility];  Service: Endoscopy;  Laterality: N/A;  diabetic-oral med   DILATION AND CURETTAGE OF UTERUS     ESOPHAGOGASTRODUODENOSCOPY (EGD) WITH PROPOFOL  N/A 01/10/2021   Procedure: ESOPHAGOGASTRODUODENOSCOPY (EGD) WITH PROPOFOL ;  Surgeon: Janalyn Keene NOVAK, MD;  Location: [facility];  Service: Endoscopy;  Laterality: N/A;  Diabetic   EXTERNAL EAR SURGERY Left    x2   POLYPECTOMY  04/25/2017   Procedure: POLYPECTOMY INTESTINAL;  Surgeon: Jinny Carmine, MD;  Location: [facility];  Service: Endoscopy;;   SPINE SURGERY     herniated disc   TUBAL LIGATION      FHx:  Family History  Problem Relation Age of Onset   Emphysema Mother    Anxiety disorder Mother    Stroke Father    Throat cancer Father    Lung cancer Maternal Grandmother    Lung cancer Maternal Grandfather    Hypertension Daughter    Diabetes Daughter    Multiple sclerosis Daughter    Bipolar disorder Daughter    Cervical cancer Daughter  Bipolar disorder Daughter    Drug abuse Daughter    Lung cancer Maternal Aunt    Lung cancer Maternal Uncle     Social History:  reports that she has been smoking cigarettes. She started smoking about 46 years ago. She has a 46.5 pack-year smoking history. She has never used smokeless tobacco. She reports that she does not drink alcohol and does not use drugs.  Allergies:  Allergies  Allergen Reactions   Augmentin  [Amoxicillin -Pot Clavulanate] Diarrhea   Penicillins Itching    Medications Prior to Admission  Medication Sig Dispense Refill   ascorbic acid  (VITAMIN C ) 500 MG tablet Take 1 tablet (500 mg total) by mouth 2 (two) times daily.     aspirin  EC 81 MG tablet Take 81 mg by mouth daily. Swallow whole.     calcium  carbonate (TUMS - DOSED IN MG ELEMENTAL CALCIUM ) 500  MG chewable tablet Chew 2 tablets (400 mg of elemental calcium  total) by mouth 3 (three) times daily as needed for indigestion or heartburn.     carbidopa -levodopa  (SINEMET  CR) 50-200 MG tablet Take 1 tablet by mouth at bedtime.     carbidopa -levodopa  (SINEMET  IR) 25-100 MG tablet Take 1.5 tablets by mouth 3 (three) times daily.     colchicine  0.6 MG tablet Take 1 tablet (0.6 mg total) by mouth 2 (two) times daily as needed for up to 3 days (gout flare).     cyclobenzaprine  (FLEXERIL ) 10 MG tablet Take 10 mg by mouth at bedtime.     diphenoxylate -atropine  (LOMOTIL ) 2.5-0.025 MG tablet Take 2 tablets by mouth 4 (four) times daily as needed for diarrhea or loose stools. 30 tablet 0   DULoxetine  (CYMBALTA ) 60 MG capsule Take 1 capsule (60 mg total) by mouth daily. 90 capsule 1   empagliflozin  (JARDIANCE ) 25 MG TABS tablet Take 1 tablet (25 mg total) by mouth daily before breakfast. 90 tablet 1   furosemide  (LASIX ) 40 MG tablet Take 1 tablet (40 mg total) by mouth daily as needed. 30 tablet 2   leptospermum manuka honey (MEDIHONEY) PSTE paste Apply 1 Application topically daily. Apply to sacral wound Apply thin layer (3 mm) to wound.     levocetirizine (XYZAL ) 5 MG tablet Take 1 tablet (5 mg total) by mouth every evening. 90 tablet 1   midodrine  (PROAMATINE ) 5 MG tablet Take 1 tablet (5 mg total) by mouth 3 (three) times daily with meals.     montelukast  (SINGULAIR ) 10 MG tablet Take 1 tablet by mouth at bedtime.     Multiple Vitamin (MULTIVITAMIN WITH MINERALS) TABS tablet Take 1 tablet by mouth daily.     omeprazole  (PRILOSEC) 40 MG capsule Take 1 capsule (40 mg total) by mouth daily. 90 capsule 0   oxyCODONE  (OXY IR/ROXICODONE ) 5 MG immediate release tablet Take 1 tablet (5 mg total) by mouth every 4 (four) hours as needed for moderate pain (pain score 4-6) or severe pain (pain score 7-10). 30 tablet 0   QUEtiapine  (SEROQUEL ) 25 MG tablet Take 1 tablet (25 mg total) by mouth at bedtime. 90 tablet 1    rosuvastatin  (CRESTOR ) 5 MG tablet Take 1 tablet (5 mg total) by mouth at bedtime. 90 tablet 1   SYMBICORT  160-4.5 MCG/ACT inhaler INHALE 2 PUFFS TWICE A DAY RINSE MOUTH WITH WATER  AFTER EACH USE 10.2 g 2   theophylline  (UNIPHYL) 400 MG 24 hr tablet Take 1 tablet by mouth daily.      tiotropium (SPIRIVA ) 18 MCG inhalation capsule Place 1 capsule into  inhaler and inhale daily. pm     VENTOLIN  HFA 108 (90 Base) MCG/ACT inhaler INHALE 1 PUFF BY MOUTH AS NEEDED 18 g 0   zinc  sulfate, 50mg  elemental zinc , 220 (50 Zn) MG capsule Take 1 capsule (220 mg total) by mouth daily.     ipratropium-albuterol  (DUONEB) 0.5-2.5 (3) MG/3ML SOLN Inhale 3 mLs into the lungs every 6 (six) hours as needed. 360 mL 3    Physical Exam: General: Alert and oriented.  No apparent distress.  Vascular: DP/PT pulses palpable bilateral, capillary fill time appears to be intact to digits bilaterally, no hair growth present to bilateral lower extremities.  Erythema and edema present to the right plantar midfoot with associated ulceration present  Neuro: Light touch sensation reduced to bilateral lower extremities.  Derm: Ulceration present to the right anterior shin which appears to be superficial and appears to have fibrogranular appearance, no signs of infection present, mild serous drainage  Similarly patient has a left anterior shin ulceration as well which appears to be superficial and granular in appearance, no erythema or edema, no signs of infection present, mild serous drainage.  Right plantar midfoot with large amount of hyperkeratotic buildup and centrally appears to have an ulceration present that probes fairly deep approximately 1 to 2 cm, no obvious bone exposed at this time but appears to be probing to the plantar fascia, appears to have phlegmon/seropurulent discharge present from the area, pain on palpation to the area, associated erythema and edema present to this specific spot but does not appear to be tracking  proximally or distally.  Lastly there also appears to be a wound to the left second toe at the dorsal PIPJ, upon examination of this appears to have a proximal phalanx head and PIPJ joint sticking out of the wound, no associated erythema and edema, appears to be stable at this time but bone is exposed.  MSK: Second toe contractures bilaterally.  Pain on palpation to the right plantar midfoot.  Results for orders placed or performed during the hospital encounter of 10/30/23 (from the past 48 hours)  Glucose, capillary     Status: None   Collection Time: 11/11/23 12:21 PM  Result Value Ref Range   Glucose-Capillary 98 70 - 99 mg/dL    Comment: Glucose reference range applies only to samples taken after fasting for at least 8 hours.  Basic metabolic panel with GFR     Status: Abnormal   Collection Time: 11/12/23  5:09 AM  Result Value Ref Range   Sodium 137 135 - 145 mmol/L   Potassium 3.4 (L) 3.5 - 5.1 mmol/L   Chloride 104 98 - 111 mmol/L   CO2 23 22 - 32 mmol/L   Glucose, Bld 87 70 - 99 mg/dL    Comment: Glucose reference range applies only to samples taken after fasting for at least 8 hours.   BUN 14 8 - 23 mg/dL   Creatinine, Ser 8.75 (H) 0.44 - 1.00 mg/dL   Calcium  7.4 (L) 8.9 - 10.3 mg/dL   GFR, Estimated 49 (L) >60 mL/min    Comment: (NOTE) Calculated using the CKD-EPI Creatinine Equation (2021)    Anion gap 10 5 - 15    Comment: Performed at [facility], [address] [place], [place] [address]  Magnesium      Status: None   Collection Time: 11/12/23  5:09 AM  Result Value Ref Range   Magnesium  1.8 1.7 - 2.4 mg/dL    Comment: Performed at [facility], [address] [place], [place] [address]  Glucose, capillary     Status: None   Collection Time: 11/12/23  8:02 AM  Result Value Ref Range   Glucose-Capillary 84 70 - 99 mg/dL    Comment: Glucose reference range applies only to samples taken after fasting for at least 8 hours.  CBC     Status:  Abnormal   Collection Time: 11/12/23  9:53 AM  Result Value Ref Range   WBC 9.8 4.0 - 10.5 K/uL   RBC 2.96 (L) 3.87 - 5.11 MIL/uL   Hemoglobin 9.4 (L) 12.0 - 15.0 g/dL   HCT 71.1 (L) 63.9 - 53.9 %   MCV 97.3 80.0 - 100.0 fL   MCH 31.8 26.0 - 34.0 pg   MCHC 32.6 30.0 - 36.0 g/dL   RDW 83.9 (H) 88.4 - 84.4 %   Platelets 480 (H) 150 - 400 K/uL   nRBC 0.0 0.0 - 0.2 %    Comment: Performed at [facility], [address], [place], [place] [address]  Lactate dehydrogenase (pleural or peritoneal fluid)     Status: Abnormal   Collection Time: 11/12/23 11:10 AM  Result Value Ref Range   LD, Fluid 247 (H) 3 - 23 U/L    Comment: (NOTE) Results should be evaluated in conjunction with serum values    Fluid Type-FLDH Lung RLL     Comment: Performed at [facility], [address] [place], [place] [address]  Body fluid cell count with differential     Status: Abnormal   Collection Time: 11/12/23 11:10 AM  Result Value Ref Range   Fluid Type-FCT Lung RLL    Color, Fluid STRAW (A) YELLOW   Appearance, Fluid HAZY (A) CLEAR   Total Nucleated Cell Count, Fluid 1,624 cu mm   Neutrophil Count, Fluid 58 %   Lymphs, Fluid 22 %   Monocyte-Macrophage-Serous Fluid 20 %   Eos, Fluid 0 %    Comment: Performed at [facility], [address] [place], [place] [address]  Protein, pleural or peritoneal fluid     Status: None   Collection Time: 11/12/23 11:10 AM  Result Value Ref Range   Total protein, fluid <3.0 g/dL    Comment: (NOTE) No normal range established for this test Results should be evaluated in conjunction with serum values    Fluid Type-FTP Lung RLL     Comment: Performed at [facility], [address] [place], [place] [address]  Glucose, pleural or peritoneal fluid     Status: None   Collection Time: 11/12/23 11:10 AM  Result Value Ref Range   Glucose, Fluid 118 mg/dL    Comment: (NOTE) No normal range established for this test Results  should be evaluated in conjunction with serum values    Fluid Type-FGLU Lung RLL     Comment: Performed at [facility], [address] [place], [place] [address]  Body fluid culture w Gram Stain     Status: None (Preliminary result)   Collection Time: 11/12/23 11:10 AM   Specimen: Lung, Right Lower Lobe; Pleural Fluid  Result Value Ref Range   Specimen Description      PLEURAL Performed at [facility], [address], [place], [place] [address]    Special Requests      R LL Performed at [facility], [address] [place], [place] [address]    Gram Stain NO WBC SEEN NO ORGANISMS SEEN     Culture  NO GROWTH < 24 HOURS Performed at [facility], [address], [place], [place] [address]    Report Status PENDING   Glucose, capillary     Status: Abnormal   Collection Time: 11/12/23 12:26 PM  Result Value Ref Range   Glucose-Capillary 162 (H) 70 - 99 mg/dL    Comment: Glucose reference range applies only to samples taken after fasting for at least 8 hours.  Aerobic/Anaerobic Culture w Gram Stain (surgical/deep wound)     Status: None (Preliminary result)   Collection Time: 11/12/23  1:31 PM   Specimen: Wound  Result Value Ref Range   Specimen Description      WOUND Performed at [facility], [address] [place], [place] [address]    Special Requests      RIGHT FOOT Performed at [facility] [address] [place], [place] [address]    Gram Stain NO WBC SEEN NO ORGANISMS SEEN     Culture      NO GROWTH < 12 HOURS Performed at [facility], [address] [place], [place] [address]    Report Status PENDING   Glucose, capillary     Status: Abnormal   Collection Time: 11/12/23  9:39 PM  Result Value Ref Range   Glucose-Capillary 160 (H) 70 - 99 mg/dL    Comment: Glucose reference range applies only to samples taken after fasting for at least 8 hours.  CBC     Status: Abnormal   Collection  Time: 11/13/23  5:30 AM  Result Value Ref Range   WBC 7.6 4.0 - 10.5 K/uL   RBC 2.35 (L) 3.87 - 5.11 MIL/uL   Hemoglobin 7.5 (L) 12.0 - 15.0 g/dL   HCT 76.9 (L) 63.9 - 53.9 %   MCV 97.9 80.0 - 100.0 fL   MCH 31.9 26.0 - 34.0 pg   MCHC 32.6 30.0 - 36.0 g/dL   RDW 83.6 (H) 88.4 - 84.4 %   Platelets 445 (H) 150 - 400 K/uL   nRBC 0.0 0.0 - 0.2 %    Comment: Performed at [facility], [address] [place], [place] [address]  Comprehensive metabolic panel with GFR     Status: Abnormal   Collection Time: 11/13/23  5:30 AM  Result Value Ref Range   Sodium 134 (L) 135 - 145 mmol/L   Potassium 3.4 (L) 3.5 - 5.1 mmol/L   Chloride 104 98 - 111 mmol/L   CO2 24 22 - 32 mmol/L   Glucose, Bld 88 70 - 99 mg/dL    Comment: Glucose reference range applies only to samples taken after fasting for at least 8 hours.   BUN 14 8 - 23 mg/dL   Creatinine, Ser 9.05 0.44 - 1.00 mg/dL   Calcium  7.2 (L) 8.9 - 10.3 mg/dL   Total Protein 4.8 (L) 6.5 - 8.1 g/dL   Albumin  1.7 (L) 3.5 - 5.0 g/dL   AST 25 15 - 41 U/L   ALT 5 0 - 44 U/L   Alkaline Phosphatase 88 38 - 126 U/L   Total Bilirubin 0.4 0.0 - 1.2 mg/dL   GFR, Estimated >39 >39 mL/min    Comment: (NOTE) Calculated using the CKD-EPI Creatinine Equation (2021)    Anion gap 6 5 - 15    Comment: Performed at [facility], [address], [place], [place] [address]  Lactate dehydrogenase     Status: None   Collection Time: 11/13/23  5:30 AM  Result Value Ref Range   LDH 149 98 - 192 U/L    Comment: Performed at [facility], [address] [place], [place] [address]  Glucose, capillary     Status: None   Collection Time: 11/13/23  8:08 AM  Result Value Ref Range   Glucose-Capillary 90 70 - 99 mg/dL    Comment: Glucose reference range applies only to samples taken after fasting for at least 8 hours.   MR FOOT RIGHT WO CONTRAST Result Date: 11/12/2023 CLINICAL DATA:  Plantar foot wound.  Evaluate for osteomyelitis.  EXAM: MRI OF THE RIGHT FOREFOOT WITHOUT CONTRAST TECHNIQUE: Multiplanar, multisequence MR imaging of the right forefoot was performed. No intravenous contrast was administered. COMPARISON:  CT of the right lower leg and foot 10/31/2023. Remote right foot radiographs 09/10/2012. FINDINGS: Technical note: Despite efforts by the technologist and patient, mild motion artifact is present on today's exam and could not be eliminated. This reduces exam sensitivity and specificity. Bones/Joint/Cartilage No evidence of acute fracture, dislocation or osteomyelitis. There is a stable old healed fracture of the 5th metatarsal shaft. Mild degenerative changes at the 1st metatarsophalangeal joint with chronic erosive changes medially in the 1st metatarsal head. Probable mild reactive marrow edema within the tibial sesamoid of the 1st metatarsal. No significant joint effusions. The alignment is normal at the Lisfranc joint. Ligaments Intact Lisfranc ligament. The collateral ligaments of the metatarsophalangeal joints appear intact. Muscles and Tendons Mild edema within the plantar forefoot musculature. No evidence of tendon tear or significant tenosynovitis. Soft tissues Soft tissue ulceration within the plantar aspect of the midfoot at the level of the Lisfranc joint. There is ill-defined T2 hyperintensity within the soft tissues superficial to the plantar fascia. No organized fluid collection is identified. There is generalized forefoot subcutaneous edema which may reflect cellulitis. IMPRESSION: 1. Soft tissue ulceration within the plantar aspect of the midfoot at the level of the Lisfranc joint with ill-defined T2 hyperintensity within the soft tissues superficial to the plantar fascia which may reflect cellulitis or phlegmon. No organized fluid collection identified. 2. No evidence of osteomyelitis. 3. Mild degenerative changes at the 1st metatarsophalangeal joint with chronic erosive changes medially in the 1st metatarsal  head. 4. Stable old healed fracture of the 5th metatarsal shaft. Electronically Signed   By: Elsie Perone M.D.   On: 11/12/2023 15:26   DG Chest Port 1 View Result Date: 11/12/2023 CLINICAL DATA:  Chest discomfort.  Post thoracentesis. EXAM: PORTABLE CHEST 1 VIEW COMPARISON:  Radiographs 10/30/2023 and 09/22/2023.  CT 11/10/2023. FINDINGS: 1129 hours. The heart size and mediastinal contours are stable with aortic atherosclerosis. Small right pleural effusion appears improved from previous CT. Mild patchy airspace opacities at both lung bases. No evidence of pneumothorax. The bones appear unchanged. IMPRESSION: No evidence of pneumothorax following thoracentesis. Small right pleural effusion appears improved. Electronically Signed   By: Elsie Perone M.D.   On: 11/12/2023 12:08   US  THORACENTESIS ASP PLEURAL SPACE W/IMG GUIDE Result Date: 11/12/2023 INDICATION: 63 year old female admitted for sepsis with cellulitis, and found to have a moderate right pleural effusion. IR was requested for diagnostic and therapeutic right-sided thoracentesis. EXAM: ULTRASOUND GUIDED DIAGNOSTIC AND THERAPEUTIC RIGHT-SIDED THORACENTESIS MEDICATIONS: 5 cc of 1% lidocaine . COMPLICATIONS: None immediate. PROCEDURE: An ultrasound guided thoracentesis was thoroughly discussed with the patient and questions answered. The benefits, risks, alternatives and complications were also discussed. The patient understands and wishes to proceed with the procedure. Written consent was obtained. Ultrasound was performed to localize and mark  an adequate pocket of fluid in the right chest. The area was then prepped and draped in the normal sterile fashion. 1% Lidocaine  was used for local anesthesia. Under ultrasound guidance a 6 Fr Safe-T-Centesis catheter was introduced. Thoracentesis was performed. The catheter was removed and a dressing applied. FINDINGS: A total of approximately 800 mL of clear, straw-colored pleural fluid was removed.  Samples were sent to the laboratory as requested by the clinical team. IMPRESSION: Successful ultrasound guided left thoracentesis yielding 0.80 L of pleural fluid. Procedure performed by Carlin Griffon, PA-C Electronically Signed   By: Cordella Banner   On: 11/12/2023 11:27   DG Swallowing Func-Speech Pathology Result Date: 11/11/2023 Table formatting from the original result was not included. Modified Barium Swallow Study Patient Details Name: RAGHAD LORENZ MRN: [id number] Date of Birth: [birth year] Today's Date: 11/11/2023 HPI/PMH: HPI: RAJVI ARMENTOR is a 63 y.o. female who presented to ED  on 10/30/2023 with acute onset of generalizated weakness and BLE swelling (L>R) after being found on the ground for several hours covered in urine and feces. Pt with a PMH significant for anxiety, osteoarthritis, asthma, COPD, depression, type 2 diabetes mellitus, GERD, dyslipidemia, and hypertension. CXR 1. Emphysema and background scarring unchanged since prior study. No  acute airspace disease.  During hospitalization, pt was evaluated/treated by ST with diet advanced to dysphagia 3 with thin liquids. Pt received NGT placement for external nutrition. NGT was discharged on 11/08/2023. ST consulted for diet recommendation. Clinical Impression: Clinical Impression: Pt presents with minimal oropharyngeal dysphagia. Pharyngeal phase notable for min reduced laryngeal vestibule closure resultant in x1 occurrence of micro aspiration and shallow penetration (trace coating on the underside of the epiglottis) with thin liquid. Of note, micro aspiration occurring on first captured swallow and not repeated despite thorough testing. Shallow, trace penetration occurring inconsistently, and did not advance into laryngeal vestibule. Oral phase notable for impaired mastication, directly impacted by lack of natural dentition.     Overall, suspect that current presentation is likely close to baseline oropharyngeal swallow  function. However, current outstanding factors (medical compromise, limited mobility/deconditioning) and baseline GI (GERD) and resp (COPD) components increase risk for aspiration and adverse events. Recommend continued regular solids and thin liquids with aspiration precautions (slow rate, small bites, elevated HOB, alert for PO intake). Emphasize oral care to reduce bacterial collection and mobility to aid pulmonary clearance. No follow up SLP services indicated. Factors that may increase risk of adverse event in presence of aspiration Noe & Lianne 2021): Factors that may increase risk of adverse event in presence of aspiration Noe & Lianne 2021): Poor general health and/or compromised immunity; Respiratory or GI disease; Limited mobility; Frail or deconditioned Recommendations/Plan: Swallowing Evaluation Recommendations Swallowing Evaluation Recommendations Recommendations: PO diet PO Diet Recommendation: Regular; Thin liquids (Level 0) Liquid Administration via: Straw; Cup Medication Administration: Whole meds with puree (vs liquid) Supervision: Patient able to self-feed; Intermittent supervision/cueing for swallowing strategies Swallowing strategies  : Minimize environmental distractions; Slow rate; Small bites/sips Postural changes: Position pt fully upright for meals; Stay upright 30-60 min after meals Oral care recommendations: Oral care BID (2x/day); Staff/trained caregiver to provide oral care Treatment Plan Treatment Plan Treatment recommendations: No treatment recommended at this time Follow-up recommendations: No SLP follow up Functional status assessment: Patient has not had a recent decline in their functional status. Recommendations Recommendations for follow up therapy are one component of a multi-disciplinary discharge planning process, led by the attending physician.  Recommendations may be updated based on patient status,  additional functional criteria and insurance authorization.  Assessment: Orofacial Exam: Orofacial Exam Oral Cavity: Oral Hygiene: WFL Oral Cavity - Dentition: Edentulous Orofacial Anatomy: WFL Oral Motor/Sensory Function: WFL Anatomy: Anatomy: WFL Boluses Administered: Boluses Administered Boluses Administered: Thin liquids (Level 0); Mildly thick liquids (Level 2, nectar thick); Moderately thick liquids (Level 3, honey thick); Puree; Solid  Oral Impairment Domain: Oral Impairment Domain Lip Closure: No labial escape Tongue control during bolus hold: Cohesive bolus between tongue to palatal seal Bolus preparation/mastication: Disorganized chewing/mashing with solid pieces of bolus unchewed (baseline secondary to dentition) Bolus transport/lingual motion: Brisk tongue motion Oral residue: Trace residue lining oral structures Location of oral residue : Tongue; Floor of mouth Initiation of pharyngeal swallow : Pyriform sinuses  Pharyngeal Impairment Domain: Pharyngeal Impairment Domain Soft palate elevation: No bolus between soft palate (SP)/pharyngeal wall (PW) Laryngeal elevation: Complete superior movement of thyroid  cartilage with complete approximation of arytenoids to epiglottic petiole Anterior hyoid excursion: Complete anterior movement Epiglottic movement: Complete inversion Laryngeal vestibule closure: Incomplete, narrow column air/contrast in laryngeal vestibule Pharyngeal stripping wave : Present - complete Pharyngeal contraction (A/P view only): N/A Pharyngoesophageal segment opening: Complete distension and complete duration, no obstruction of flow Tongue base retraction: No contrast between tongue base and posterior pharyngeal wall (PPW) Pharyngeal residue: Trace residue within or on pharyngeal structures Location of pharyngeal residue: Valleculae  Esophageal Impairment Domain: Esophageal Impairment Domain Esophageal clearance upright position: Complete clearance, esophageal coating Pill: Pill Consistency administered: -- (n/a) Penetration/Aspiration Scale Score:  Penetration/Aspiration Scale Score 1.  Material does not enter airway: Mildly thick liquids (Level 2, nectar thick); Moderately thick liquids (Level 3, honey thick); Puree; Solid 3.  Material enters airway, remains ABOVE vocal cords and not ejected out: Thin liquids (Level 0) 8.  Material enters airway, passes BELOW cords without attempt by patient to eject out (silent aspiration) : Thin liquids (Level 0) (x1 instance of micro aspiration) Compensatory Strategies: Compensatory Strategies Compensatory strategies: No   General Information: Caregiver present: No  Diet Prior to this Study: Regular; Thin liquids (Level 0)   Temperature : Normal   Respiratory Status: WFL   Supplemental O2: None (Room air)   History of Recent Intubation: No  Behavior/Cognition: Alert; Cooperative; Pleasant mood Self-Feeding Abilities: Able to self-feed Baseline vocal quality/speech: Normal Volitional Cough: Able to elicit Volitional Swallow: Able to elicit Exam Limitations: No limitations Goal Planning: No data recorded No data recorded No data recorded Patient/Family Stated Goal: none present Consulted and agree with results and recommendations: Patient; Physician; Nurse Pain: Pain Assessment Pain Assessment: 0-10 Pain Score: 8 Faces Pain Scale: 6 Pain Location: R foot neuropathy Pain Descriptors / Indicators: Pins and needles; Stabbing Pain Intervention(s): Limited activity within patient's tolerance; Monitored during session; Patient requesting pain meds-RN notified End of Session: Start Time:SLP Start Time (ACUTE ONLY): 1100 Stop Time: SLP Stop Time (ACUTE ONLY): 1145 Time Calculation:SLP Time Calculation (min) (ACUTE ONLY): 45 min Charges: SLP Evaluations $ SLP Speech Visit: 1 Visit SLP Evaluations $BSS Swallow: 1 Procedure $MBS Swallow: 1 Procedure SLP visit diagnosis: SLP Visit Diagnosis: Dysphagia, oropharyngeal phase (R13.12) Past Medical History: Past Medical History: Diagnosis Date  Anxiety   Arthritis   joints and hands/ knees   Asthma   uses inhaler  Benign essential tremor   head  Cervical dystonia   neck pain  Cholesteatoma of left ear   x2  COPD (chronic obstructive pulmonary disease) (HCC)   Cough   Depression   Diabetes mellitus without complication (HCC)   type 2  Diastolic dysfunction   Dyspnea   Dysrhythmia   diastolic dysfunction  GERD (gastroesophageal reflux disease)   Headache   migraines/ one per week  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  Hyperlipidemia   Hypertension   Motion sickness   boat  Neuromuscular disorder (HCC)   neuropathy feet and hands( nerve damage)  Wears dentures   upper and lower Past Surgical History: Past Surgical History: Procedure Laterality Date  CARPAL TUNNEL RELEASE Bilateral   x2 right, 1x on left  COLONOSCOPY    COLONOSCOPY WITH PROPOFOL  N/A 04/25/2017  Procedure: COLONOSCOPY WITH PROPOFOL ;  Surgeon: Jinny Carmine, MD;  Location: [facility];  Service: Endoscopy;  Laterality: N/A;  diabetic-oral med  DILATION AND CURETTAGE OF UTERUS    ESOPHAGOGASTRODUODENOSCOPY (EGD) WITH PROPOFOL  N/A 01/10/2021  Procedure: ESOPHAGOGASTRODUODENOSCOPY (EGD) WITH PROPOFOL ;  Surgeon: Janalyn Keene NOVAK, MD;  Location: [facility];  Service: Endoscopy;  Laterality: N/A;  Diabetic  EXTERNAL EAR SURGERY Left   x2  POLYPECTOMY  04/25/2017  Procedure: POLYPECTOMY INTESTINAL;  Surgeon: Jinny Carmine, MD;  Location: [facility];  Service: Endoscopy;;  SPINE SURGERY    herniated disc  TUBAL LIGATION   [place] Jarrett Clapp, MS, CCC-SLP Speech Language Pathologist Rehab Services; [facility] [place] [facility] [phone] (ascom) [place] J Clapp 11/11/2023, 12:36 PM   Blood pressure 133/67, pulse 85, temperature 97.8 F (36.6 C), resp. rate 18, height 5' 8 (1.727 m), weight 66.2 kg, SpO2 95%.  Assessment Abscess right foot secondary to diabetic foot ulceration Osteomyelitis left second toe with associated diabetic foot ulceration Ulcerations to both lower legs, dermal Diabetes type 2  polyneuropathy  Plan -Patient seen and examined - MRI imaging reviewed of the right foot showing superficial phlegmon present with cellulitis around the right midfoot in the area of the midfoot plantar ulceration, no obvious evidence for osteomyelitis.  X-ray imaging reviewed of the left foot which did not reveal any acute pathology present.  Clinically patient appears to have bone exposed to the PIPJ left second toe today. - MRI ordered of left foot without contrast for further assessment of bone. - Wound culture taken from right foot abscess and sent off. -Patient will need I&D to right foot and likely left second toe PIPJ arthroplasty versus amputation.  Discussed treatment options with patient.  Patient agreeable.  Will await MRI results before final plan. All treatment options were discussed with the patient of both conservative and surgical attempts at correction including potential risks and complications.  Patient has elected for procedure consisting of right foot incision and drainage and left second toe amputation versus arthroplasty, wound debridements both lower legs.  No guarantees given.  Consent obtained. - Plan for surgery at some point tomorrow afternoon.  Patient will be n.p.o. at midnight for surgery. - Continue with wound care instructions for dressing changes to both lower legs and feet.  Will change orders likely after surgery. - Ordered surgical shoes for both feet.  Patient should be heel weightbearing on the right foot and may be weightbearing as tolerated on the left foot. - Appreciate medicine recommendations for antibiotic therapy.  Will take further intraoperative cultures at time of procedure.   Prentice Lee, DPM 11/13/2023, 9:07 AM

## 2023-11-13 NOTE — Assessment & Plan Note (Signed)
 Interventional radiology drew off 800 mL of fluid on 9/23.

## 2023-11-13 NOTE — Consult Note (Addendum)
 PHARMACY CONSULT NOTE - ELECTROLYTES  Pharmacy Consult for Electrolyte Monitoring and Replacement   Recent Labs: Height: 5' 8 (172.7 cm) Weight: 66.2 kg (145 lb 15.1 oz) IBW/kg (Calculated) : 63.9 Estimated Creatinine Clearance: 61.8 mL/min (by C-G formula based on SCr of 0.94 mg/dL). Potassium (mmol/L)  Date Value  11/13/2023 3.4 (L)  11/16/2013 4.0   Magnesium  (mg/dL)  Date Value  90/76/7974 1.8   Calcium  (mg/dL)  Date Value  90/75/7974 7.2 (L)   Calcium , Total (mg/dL)  Date Value  90/71/7984 7.9 (L)   Albumin  (g/dL)  Date Value  90/75/7974 1.7 (L)  07/06/2015 4.2  10/07/2012 2.9 (L)   Phosphorus (mg/dL)  Date Value  90/79/7974 2.7   Sodium (mmol/L)  Date Value  11/13/2023 134 (L)  07/06/2015 146 (H)  11/16/2013 131 (L)   Assessment  Tricia [name] is a 63 y.o. female presenting with weakness and leg swelling. PMH significant for anxiety, osteoarthritis, asthma, COPD, depression, type 2 diabetes mellitus, GERD, dyslipidemia, and hypertension. Pharmacy has been consulted to monitor and replace electrolytes.  Diet: dysphagia 3 diet MIVF: N/A Pertinent medications: N/A  Goal of Therapy: Electrolytes WNL  Plan:  --K 3.4 >> potassium chloride  40 mEq PO x 1 ordered by care team. Will add additional 40mEq for afternoon given pt's K is slow to change --Check BMP, Mg next with tomorrow AM labs  Thank you for involving pharmacy in this patient's care.   Will M. Lenon, PharmD, BCPS Clinical Pharmacist 11/13/2023 9:23 AM

## 2023-11-13 NOTE — Assessment & Plan Note (Signed)
 On low-dose torsemide 

## 2023-11-13 NOTE — Progress Notes (Signed)
 PT Cancellation Note  Patient Details Name: Tricia [name] MRN: [id number] DOB: [birth year]   Cancelled Treatment:     Pt awaiting Surgery tomorrow for right foot incision and drainage, and left second toe amputation versus arthroplasty, wound debridements both lower legs. Podiatry ordered surgical shoes for both feet with new recs for heel weightbearing on the right foot and may be weightbearing as tolerated on the left foot. Will re-assess post procedure once cleared.     Darice JAYSON Bohr 11/13/2023, 11:27 AM

## 2023-11-13 NOTE — Progress Notes (Signed)
 Nutrition Follow-up  DOCUMENTATION CODES:   Severe malnutrition in context of chronic illness  INTERVENTION:   -Continue dysphagia 3 diet  -Continue MVI with minerals daily -D/c Ensure Plus High Protein po BID, each supplement provides 350 kcal and 20 grams of protein  -Continue Magic cup TID with meals, each supplement provides 290 kcal and 9 grams of protein  -Continue 50,000 units vitamin D  weekly  NUTRITION DIAGNOSIS:   Severe Malnutrition related to chronic illness (COPD) as evidenced by moderate fat depletion, severe fat depletion, moderate muscle depletion, severe muscle depletion, percent weight loss.  Ongoing  GOAL:   Patient will meet greater than or equal to 90% of their needs  Progressing   MONITOR:   PO intake, Supplement acceptance, Labs, Weight trends, TF tolerance, I & O's, Skin  REASON FOR ASSESSMENT:   Consult Assessment of nutrition requirement/status, Enteral/tube feeding initiation and management  ASSESSMENT:   63 y/o female with h/o CKD III, HLD, Parkinson's dementia, anxiety, MDD, COPD, GERD, DM, CHF, chronic pain, HTN, gout, HOH, tremor and recent admission for syncope, AKI, orthostatic hypotension, polypharmacy and urinary retention and who is now admitted with cellulitis of bilateral lower extremities, bacteremia, septic shock, AKI and COPD exacerbation.  9/11- s/p BSE- NPO 9/12- s/p BSE- NPO, NGT placed- KUB confirmed side port and tip of tube in stomach 9/13- s/p BSE- dysphagia 3 diet with thin liquids 9/16- NGT removed 9/21- s/p BSE- regular diet with thin liquids 9/22- s/p MBSS- regular diet with thin liquids 9/23- rt thoracentesis (0.8 L removed)  Reviewed I/O's: +380 ml x 24 hours and +6.8 L since admission  UOP: 400 ml x 24 hours   Pt with improved oral intake. Noted meal completions 25-100%. She has been refusing Ensure supplements over the past several days.   Reviewed wts; pt has experienced a 14.2% wt loss over the past week.  Unsure of accuracy of previous wts. Pt's wt is close to admission wt. RD will continue to follow weight trends.   Per podiatry notes, plan for I&D to right foot and likely left second toe PIPJ arthroplasty versus amputation on 11/14/23.  Per TOC notes, plan for SNF placement at discharge.   Medications reviewed and include sinemet , lovenox , neurontin , protonix , demadex , vitamin D , and rocephin .   Labs reviewed: Na: 134, K: 3.4, CBGS: 90-162.   Diet Order:   Diet Order             Diet NPO time specified  Diet effective midnight           Diet regular Fluid consistency: Thin  Diet effective now                   EDUCATION NEEDS:   Education needs have been addressed  Skin:  Skin Assessment: Skin Integrity Issues: Skin Integrity Issues:: Other (Comment), DTI DTI: sacrum Other: full thickness to trauma wounds to bilateral lower legs  Last BM:  11/13/23 (type 6)  Height:   Ht Readings from Last 1 Encounters:  10/31/23 5' 8 (1.727 m)    Weight:   Wt Readings from Last 1 Encounters:  11/13/23 66.2 kg    Ideal Body Weight:  63.6 kg  BMI:  Body mass index is 22.19 kg/m.  Estimated Nutritional Needs:   Kcal:  1700-2000kcal/day  Protein:  85-100g/day  Fluid:  1.7-2.0L/day    Margery ORN, RD, LDN, CDCES Registered Dietitian III Certified Diabetes Care and Education Specialist If unable to reach this RD, please use RD Inpatient  group chat on secure chat between hours of 8am-4 pm daily

## 2023-11-14 ENCOUNTER — Encounter: Payer: Self-pay | Admitting: Student in an Organized Health Care Education/Training Program

## 2023-11-14 ENCOUNTER — Inpatient Hospital Stay

## 2023-11-14 ENCOUNTER — Other Ambulatory Visit: Payer: Self-pay

## 2023-11-14 ENCOUNTER — Encounter
Admission: EM | Disposition: A | Discharge status: Skilled Nursing Facility | Payer: Self-pay | Source: Home / Self Care | Attending: Student

## 2023-11-14 DIAGNOSIS — I5032 Chronic diastolic (congestive) heart failure: Secondary | ICD-10-CM

## 2023-11-14 DIAGNOSIS — E43 Unspecified severe protein-calorie malnutrition: Secondary | ICD-10-CM

## 2023-11-14 DIAGNOSIS — A419 Sepsis, unspecified organism: Secondary | ICD-10-CM | POA: Diagnosis not present

## 2023-11-14 DIAGNOSIS — L97512 Non-pressure chronic ulcer of other part of right foot with fat layer exposed: Secondary | ICD-10-CM | POA: Diagnosis not present

## 2023-11-14 DIAGNOSIS — L8990 Pressure ulcer of unspecified site, unspecified stage: Secondary | ICD-10-CM

## 2023-11-14 DIAGNOSIS — L02611 Cutaneous abscess of right foot: Secondary | ICD-10-CM

## 2023-11-14 DIAGNOSIS — L0390 Cellulitis, unspecified: Secondary | ICD-10-CM | POA: Diagnosis not present

## 2023-11-14 HISTORY — PX: WOUND DEBRIDEMENT: SHX247

## 2023-11-14 HISTORY — PX: AMPUTATION TOE: SHX6595

## 2023-11-14 HISTORY — PX: IRRIGATION AND DEBRIDEMENT FOOT: SHX6602

## 2023-11-14 LAB — BASIC METABOLIC PANEL WITH GFR
Anion gap: 8 (ref 5–15)
BUN: 14 mg/dL (ref 8–23)
CO2: 25 mmol/L (ref 22–32)
Calcium: 7.3 mg/dL — ABNORMAL LOW (ref 8.9–10.3)
Chloride: 104 mmol/L (ref 98–111)
Creatinine, Ser: 1.11 mg/dL — ABNORMAL HIGH (ref 0.44–1.00)
GFR, Estimated: 56 mL/min — ABNORMAL LOW (ref >60)
Glucose, Bld: 116 mg/dL — ABNORMAL HIGH (ref 70–99)
Potassium: 3.4 mmol/L — ABNORMAL LOW (ref 3.5–5.1)
Sodium: 137 mmol/L (ref 135–145)

## 2023-11-14 LAB — CBC
HCT: 23 % — ABNORMAL LOW (ref 36.0–46.0)
Hemoglobin: 7.5 g/dL — ABNORMAL LOW (ref 12.0–15.0)
MCH: 32.3 pg (ref 26.0–34.0)
MCHC: 32.6 g/dL (ref 30.0–36.0)
MCV: 99.1 fL (ref 80.0–100.0)
Platelets: 414 K/uL — ABNORMAL HIGH (ref 150–400)
RBC: 2.32 MIL/uL — ABNORMAL LOW (ref 3.87–5.11)
RDW: 16.4 % — ABNORMAL HIGH (ref 11.5–15.5)
WBC: 6 K/uL (ref 4.0–10.5)
nRBC: 0 % (ref 0.0–0.2)

## 2023-11-14 LAB — MAGNESIUM: Magnesium: 1.6 mg/dL — ABNORMAL LOW (ref 1.7–2.4)

## 2023-11-14 LAB — ABO/RH: ABO/RH(D): A POS

## 2023-11-14 LAB — GLUCOSE, CAPILLARY
Glucose-Capillary: 77 mg/dL (ref 70–99)
Glucose-Capillary: 76 mg/dL (ref 70–99)
Glucose-Capillary: 82 mg/dL (ref 70–99)
Glucose-Capillary: 142 mg/dL — ABNORMAL HIGH (ref 70–99)

## 2023-11-14 SURGERY — IRRIGATION AND DEBRIDEMENT FOOT
Anesthesia: General | Site: Toe | Laterality: Right

## 2023-11-14 MED ORDER — 0.9 % SODIUM CHLORIDE (POUR BTL) OPTIME
TOPICAL | Status: DC | PRN
  Administered 2023-11-14: 500 mL

## 2023-11-14 MED ORDER — MIDAZOLAM HCL 2 MG/2ML IJ SOLN
INTRAMUSCULAR | Status: DC | PRN
  Administered 2023-11-14: 2 mg via INTRAVENOUS

## 2023-11-14 MED ORDER — LIDOCAINE HCL (PF) 1 % IJ SOLN
INTRAMUSCULAR | Status: AC
  Filled 2023-11-14: qty 30

## 2023-11-14 MED ORDER — ALBUMIN HUMAN 5 % IV SOLN
INTRAVENOUS | Status: AC
  Filled 2023-11-14: qty 250

## 2023-11-14 MED ORDER — PHENYLEPHRINE 80 MCG/ML (10ML) SYRINGE FOR IV PUSH (FOR BLOOD PRESSURE SUPPORT)
PREFILLED_SYRINGE | INTRAVENOUS | Status: AC
  Filled 2023-11-14: qty 10

## 2023-11-14 MED ORDER — SODIUM CHLORIDE 0.9 % IV SOLN: INTRAVENOUS | Status: DC | PRN

## 2023-11-14 MED ORDER — DEXTROSE 50 % IV SOLN
INTRAVENOUS | Status: AC
  Filled 2023-11-14: qty 50

## 2023-11-14 MED ORDER — PROPOFOL 10 MG/ML IV BOLUS
INTRAVENOUS | Status: DC | PRN
  Administered 2023-11-14: 100 ug/kg/min via INTRAVENOUS
  Administered 2023-11-14: 100 mg via INTRAVENOUS

## 2023-11-14 MED ORDER — MIDAZOLAM HCL 2 MG/2ML IJ SOLN
INTRAMUSCULAR | Status: AC
  Filled 2023-11-14: qty 2

## 2023-11-14 MED ORDER — LIDOCAINE HCL (PF) 2 % IJ SOLN
INTRAMUSCULAR | Status: AC
  Filled 2023-11-14: qty 5

## 2023-11-14 MED ORDER — MAGNESIUM SULFATE 2 GM/50ML IV SOLN
2.0000 g | Freq: Once | INTRAVENOUS | Status: AC
  Administered 2023-11-14: 2 g via INTRAVENOUS
  Filled 2023-11-14: qty 50

## 2023-11-14 MED ORDER — ACETAMINOPHEN 10 MG/ML IV SOLN
INTRAVENOUS | Status: AC
Start: 2023-11-14 — End: 2023-11-14
  Filled 2023-11-14: qty 100

## 2023-11-14 MED ORDER — VANCOMYCIN HCL 1000 MG IV SOLR
INTRAVENOUS | Status: DC | PRN
  Administered 2023-11-14: 1000 mg via TOPICAL

## 2023-11-14 MED ORDER — LIDOCAINE HCL 1 % IJ SOLN
INTRAMUSCULAR | Status: DC | PRN
  Administered 2023-11-14: 10 mL

## 2023-11-14 MED ORDER — FENTANYL CITRATE (PF) 100 MCG/2ML IJ SOLN
INTRAMUSCULAR | Status: DC | PRN
  Administered 2023-11-14 (×2): 50 ug via INTRAVENOUS

## 2023-11-14 MED ORDER — PHENYLEPHRINE 80 MCG/ML (10ML) SYRINGE FOR IV PUSH (FOR BLOOD PRESSURE SUPPORT)
PREFILLED_SYRINGE | INTRAVENOUS | Status: DC | PRN
  Administered 2023-11-14 (×4): 80 ug via INTRAVENOUS
  Administered 2023-11-14: 160 ug via INTRAVENOUS
  Administered 2023-11-14: 80 ug via INTRAVENOUS
  Administered 2023-11-14: 160 ug via INTRAVENOUS
  Administered 2023-11-14: 80 ug via INTRAVENOUS

## 2023-11-14 MED ORDER — DEXTROSE 50 % IV SOLN
12.5000 g | Freq: Once | INTRAVENOUS | Status: AC
  Administered 2023-11-14: 12.5 g via INTRAVENOUS

## 2023-11-14 MED ORDER — ALBUMIN HUMAN 5 % IV SOLN: INTRAVENOUS | Status: DC | PRN

## 2023-11-14 MED ORDER — LIDOCAINE HCL URETHRAL/MUCOSAL 2 % EX GEL
CUTANEOUS | Status: AC
  Filled 2023-11-14: qty 6

## 2023-11-14 MED ORDER — BUPIVACAINE HCL 0.5 % IJ SOLN
INTRAMUSCULAR | Status: DC | PRN
  Administered 2023-11-14 (×2): 15 mL

## 2023-11-14 MED ORDER — POTASSIUM CHLORIDE CRYS ER 20 MEQ PO TBCR
40.0000 meq | EXTENDED_RELEASE_TABLET | Freq: Once | ORAL | Status: AC
  Administered 2023-11-14: 40 meq via ORAL
  Filled 2023-11-14: qty 2

## 2023-11-14 MED ORDER — LIDOCAINE HCL (CARDIAC) PF 100 MG/5ML IV SOSY
PREFILLED_SYRINGE | INTRAVENOUS | Status: DC | PRN
  Administered 2023-11-14: 100 mg via INTRAVENOUS

## 2023-11-14 MED ORDER — BUPIVACAINE HCL (PF) 0.5 % IJ SOLN
INTRAMUSCULAR | Status: AC
Start: 2023-11-14 — End: 2023-11-14
  Filled 2023-11-14: qty 30

## 2023-11-14 MED ORDER — VANCOMYCIN HCL 1000 MG IV SOLR
INTRAVENOUS | Status: AC
  Filled 2023-11-14: qty 20

## 2023-11-14 MED ORDER — MORPHINE SULFATE (PF) 2 MG/ML IV SOLN
2.0000 mg | INTRAVENOUS | Status: DC | PRN
  Administered 2023-11-14 – 2023-11-16 (×8): 2 mg via INTRAVENOUS
  Filled 2023-11-14 (×8): qty 1

## 2023-11-14 MED ORDER — SODIUM CHLORIDE 0.9 % IR SOLN
Status: DC | PRN
  Administered 2023-11-14: 1000 mL
  Administered 2023-11-14: 3000 mL

## 2023-11-14 MED ORDER — FENTANYL CITRATE (PF) 100 MCG/2ML IJ SOLN
INTRAMUSCULAR | Status: AC
  Filled 2023-11-14: qty 2

## 2023-11-14 MED ORDER — ACETAMINOPHEN 10 MG/ML IV SOLN
INTRAVENOUS | Status: DC | PRN
  Administered 2023-11-14: 1000 mg via INTRAVENOUS

## 2023-11-14 SURGICAL SUPPLY — 54 items
BAG COUNTER SPONGE SURGICOUNT (BAG) ×3 IMPLANT
BLADE MED AGGRESSIVE (BLADE) ×3 IMPLANT
BLADE OSC/SAGITTAL MD 5.5X18 (BLADE) IMPLANT
BLADE SURG 15 STRL LF DISP TIS (BLADE) IMPLANT
BLADE SURG MINI STRL (BLADE) IMPLANT
BLADE SW THK.38XMED LNG THN (BLADE) IMPLANT
BNDG ELASTIC 4INX 5YD STR LF (GAUZE/BANDAGES/DRESSINGS) ×3 IMPLANT
BNDG ELASTIC 4X5.8 VLCR NS LF (GAUZE/BANDAGES/DRESSINGS) IMPLANT
BNDG ESMARCH 4X12 STRL LF (GAUZE/BANDAGES/DRESSINGS) ×3 IMPLANT
BNDG GAUZE DERMACEA FLUFF 4 (GAUZE/BANDAGES/DRESSINGS) ×3 IMPLANT
BNDG STRETCH GAUZE 3IN X12FT (GAUZE/BANDAGES/DRESSINGS) IMPLANT
CNTNR URN SCR LID CUP LEK RST (MISCELLANEOUS) ×3 IMPLANT
CUFF TOURN SGL QUICK 12 (TOURNIQUET CUFF) IMPLANT
CUFF TOURN SGL QUICK 18X4 (TOURNIQUET CUFF) IMPLANT
DRAPE BILAT LIMB 76X120 89291 (MISCELLANEOUS) IMPLANT
DRAPE FLUOR MINI C-ARM 54X84 (DRAPES) IMPLANT
DRAPE IMP U-DRAPE 54X76 (DRAPES) IMPLANT
ELECTRODE REM PT RTRN 9FT ADLT (ELECTROSURGICAL) ×3 IMPLANT
GAUZE PACKING 0.25INX5YD STRL (GAUZE/BANDAGES/DRESSINGS) IMPLANT
GAUZE SPONGE 4X4 12PLY STRL (GAUZE/BANDAGES/DRESSINGS) ×6 IMPLANT
GAUZE STRETCH 2X75IN STRL (MISCELLANEOUS) IMPLANT
GAUZE XEROFORM 1X8 LF (GAUZE/BANDAGES/DRESSINGS) ×3 IMPLANT
GLOVE BIO SURGEON STRL SZ7 (GLOVE) ×3 IMPLANT
GLOVE INDICATOR 7.0 STRL GRN (GLOVE) ×3 IMPLANT
GLOVE INDICATOR 7.5 STRL GRN (GLOVE) ×3 IMPLANT
GOWN STRL REUS W/ TWL LRG LVL3 (GOWN DISPOSABLE) ×6 IMPLANT
HANDPIECE VERSAJET DEBRIDEMENT (MISCELLANEOUS) IMPLANT
IV NS 1000ML BAXH (IV SOLUTION) IMPLANT
KIT TURNOVER KIT A (KITS) ×3 IMPLANT
LABEL OR SOLS (LABEL) ×3 IMPLANT
MANIFOLD NEPTUNE II (INSTRUMENTS) ×3 IMPLANT
NDL FILTER BLUNT 18X1 1/2 (NEEDLE) ×3 IMPLANT
NDL HYPO 25X1 1.5 SAFETY (NEEDLE) ×6 IMPLANT
NEEDLE FILTER BLUNT 18X1 1/2 (NEEDLE) ×3 IMPLANT
NEEDLE HYPO 25X1 1.5 SAFETY (NEEDLE) ×6 IMPLANT
NS IRRIG 500ML POUR BTL (IV SOLUTION) ×3 IMPLANT
PACK EXTREMITY ARMC (MISCELLANEOUS) ×3 IMPLANT
PACKING GAUZE IODOFORM 1INX5YD (GAUZE/BANDAGES/DRESSINGS) IMPLANT
PAD ABD DERMACEA PRESS 5X9 (GAUZE/BANDAGES/DRESSINGS) IMPLANT
PENCIL SMOKE EVACUATOR (MISCELLANEOUS) ×3 IMPLANT
RASP SM TEAR CROSS CUT (RASP) IMPLANT
SOL .9 NS 3000ML IRR UROMATIC (IV SOLUTION) IMPLANT
SOLUTION PREP PVP 2OZ (MISCELLANEOUS) ×3 IMPLANT
SPONGE T-LAP 18X18 ~~LOC~~+RFID (SPONGE) IMPLANT
STOCKINETTE STRL 6IN 960660 (GAUZE/BANDAGES/DRESSINGS) ×3 IMPLANT
SUT VIC AB 2-0 CT1 TAPERPNT 27 (SUTURE) IMPLANT
SUT VIC AB 3-0 SH 27X BRD (SUTURE) ×3 IMPLANT
SUTURE EHLN 3-0 FS-10 30 BLK (SUTURE) ×6 IMPLANT
SUTURE ETHLN 4-0 FS2 18XMF BLK (SUTURE) IMPLANT
SWAB CULTURE AMIES ANAERIB BLU (MISCELLANEOUS) IMPLANT
SYR 10ML LL (SYRINGE) ×3 IMPLANT
TIP FAN IRRIG PULSAVAC PLUS (DISPOSABLE) IMPLANT
TRAP FLUID SMOKE EVACUATOR (MISCELLANEOUS) ×3 IMPLANT
WATER STERILE IRR 500ML POUR (IV SOLUTION) ×3 IMPLANT

## 2023-11-14 NOTE — Anesthesia Postprocedure Evaluation (Signed)
 Anesthesia Post Note  Patient: ZAYA KESSENICH  Procedure(s) Performed: IRRIGATION AND DEBRIDEMENT FOOT (Right: Foot) AMPUTATION, TOE (Left: Toe) DEBRIDEMENT, WOUND (Bilateral: Leg Lower)  Patient location during evaluation: PACU Anesthesia Type: General Level of consciousness: awake and alert Pain management: pain level controlled Vital Signs Assessment: post-procedure vital signs reviewed and stable Respiratory status: spontaneous breathing, nonlabored ventilation, respiratory function stable and patient connected to nasal cannula oxygen  Cardiovascular status: blood pressure returned to baseline and stable Postop Assessment: no apparent nausea or vomiting Anesthetic complications: no   There were no known notable events for this encounter.   Last Vitals:  Vitals:   11/14/23 1347 11/14/23 1400  BP: (!) 101/53 (!) 103/54  Pulse: 78 74  Resp: 10 11  Temp: (!) 36.4 C   SpO2: 95% 99%    Last Pain:  Vitals:   11/14/23 1347  TempSrc:   PainSc: 0-No pain                 Debby Mines

## 2023-11-14 NOTE — Progress Notes (Signed)
 Progress Note   [name] FMW:[id number] DOB: 27-Aug-1960 DOA: 10/30/2023     15 DOS: the patient was seen and examined on 11/14/2023   Brief hospital course: 63 y.o. female with a PMH significant for anxiety, osteoarthritis, asthma, COPD, depression, type 2 diabetes mellitus, GERD, dyslipidemia, and HTN, who presented to the emergency room with acute onset of generalized weakness and bilateral lower extremity swelling after being found down after several hours.   ED Course: BP was 109/53 with heart rate of 114 and later BP was 96/52 and temperature was 99.8, respiratory rate was 22. Labs reveal hyponatremia 128 and hyperkalemia of 6 with a CO2 of 13 and glucose of 126, BUN of 42 and creatinine 3.64 calcium  of 7.8 and albumin  2 with total protein 5.7.  BNP was 78.5 and high-sensitivity troponin I was 14.  Lactic acid was 2.6 and CBC showed hemoglobin 11.8 and hematocrit 36.9.  Respiratory panel came back negative.  Blood cultures were drawn. EKG: sinus tachycardia with rate 118 with poor R wave progression. Imaging: Portable chest x-ray showed emphysema with no acute cardiopulmonary disease. The patient was given IV vancomycin  and  IV cefepime .  On initial evaluation- patient was lethargic and minimally responsive. BP low and not responding to IV fluids or increase in midodrine . CCM was consulted who took over care for patient who required vasopressors. Weaned from them 9/14. NG tube was placed for nutrition as she was unable to tolerate PO while incapacitated.  9/15: transferred back to [facility]   11/12/23 -patient BP stable, will mean midodrine . PO intake is low but consistent, will dc NG tube and add PO supplements. Renal function improved- remove tunneled cath as she is not likely to need HD. Respiratory status- improved back to baseline O2 use. 9/24.  I was able to squeeze out some pus from her right foot.  MRI did not show any evidence of osteomyelitis and no organized fluid  collection identified.  Case discussed with podiatry and he would like to take to the operating room for washout procedure.  MRI of the left foot ordered because he does not like the way a toe on the left foot looks. 9/25.  Dr. Lennie podiatry took to the operating room for right foot incision and drainage of right midfoot abscess, right foot wound debridement, left foot wound debridement and left second toe amputation  Assessment and Plan: * Sepsis due to cellulitis [facility]) Present on admission.  Yesterday has pus coming from the right foot.  MRI does not show osteomyelitis or an abscess.  Podiatry brought to the operating room today for incision and drainage of abscess right foot.  Patient on Rocephin  and doxycycline   Toe ulcer (HCC) Second toe left foot suspected osteomyelitis.  Dr. Lennie took to the operating room for second toe amputation on left foot.  Drop in hemoglobin Today's hemoglobin down to 7.5.   May end up needing a blood transfusion.  No signs of bleeding.  Did have a thoracentesis.  Ferritin elevated going along with anemia of chronic disease  Pleural effusion on right Interventional radiology drew off 800 mL of fluid on 9/23.  AKI (acute kidney injury) Creatinine 3.64 on presentation and currently 1.11  GERD without esophagitis Continue PPI therapy.  Parkinson's disease (HCC) - Will continue Sinemet  CR and Sinemet  IR.  Dyslipidemia Will continue statin therapy.  Chronic diastolic CHF (congestive heart failure) (HCC) On low-dose torsemide   Hyponatremia Last sodium normal range  Hyperkalemia Resolved after acute kidney injury improved.  Now with hypokalemia.  Asthma, chronic Will continue theophylline , Singulair , and his bronchodilator inhaler.  Hypomagnesemia Replace IV magnesium   Pressure injury Wound 09/08/23 1300 Pressure Injury Sacrum Medial Stage 3 -  Full thickness tissue loss. Subcutaneous fat may be visible but bone, tendon or muscle are NOT exposed.  (Active)     Wound 09/22/23 2320 Pressure Injury Buttocks Left Stage 1 -  Intact skin with non-blanchable redness of a localized area usually over a bony prominence. (Active)     Wound 09/22/23 2320 Pressure Injury Buttocks Right Stage 2 -  Partial thickness loss of dermis presenting as a shallow open injury with a red, pink wound bed without slough. (Active)     Wound 10/31/23 1900 Pressure Injury Buttocks Bilateral Deep Tissue Pressure Injury - Purple or maroon localized area of discolored intact skin or blood-filled blister due to damage of underlying soft tissue from pressure and/or shear. (Active)      Protein-calorie malnutrition, severe Continue supplements  Gout Continue colchicine .       Subjective: Patient feels okay.  Had some pain in her right chest yesterday.  Chest x-ray did not show a pneumothorax.  Physical Exam: Vitals:   11/14/23 0731 11/14/23 1347 11/14/23 1400 11/14/23 1415  BP: 130/69 (!) 101/53 (!) 103/54 110/62  Pulse: 83 78 74 75  Resp: 18 10 11  (!) 8  Temp: 98.3 F (36.8 C) (!) 97.5 F (36.4 C)    TempSrc: Oral     SpO2: 96% 95% 99% 94%  Weight:      Height:       Physical Exam HENT:     Head: Normocephalic.  Eyes:     General: Lids are normal.     Conjunctiva/sclera: Conjunctivae normal.  Cardiovascular:     Rate and Rhythm: Normal rate and regular rhythm.     Heart sounds: Normal heart sounds, S1 normal and S2 normal.  Pulmonary:     Breath sounds: No decreased breath sounds, wheezing, rhonchi or rales.  Abdominal:     Palpations: Abdomen is soft.     Tenderness: There is no abdominal tenderness.  Musculoskeletal:     Right lower leg: Swelling present.     Left lower leg: Swelling present.  Skin:    General: Skin is warm.  Neurological:     Mental Status: She is alert and oriented to person, place, and time.     Data Reviewed: Potassium 3.4, magnesium  1.6, creatinine 1.1, hemoglobin 7.5  Family Communication: Spoke with  son-in-law  Disposition: Status is: Inpatient Remains inpatient appropriate because: Patient went to the operating room today  Planned Discharge Destination: Rehab    Time spent: 28 minutes  Author: Charlie Patterson, MD 11/14/2023 2:30 PM  For on call review [email or web address].

## 2023-11-14 NOTE — Op Note (Signed)
 PODIATRY / FOOT AND ANKLE SURGERY OPERATIVE REPORT    SURGEON: Prentice Lee, DPM  PRE-OPERATIVE DIAGNOSIS:  1.  Right lower leg wounds 2.  Left lower leg wound 3.  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  Abscess right foot  POST-OPERATIVE DIAGNOSIS: Same  PROCEDURE(S): Right foot incision and drainage Right foot wound debridement, subcutaneous excisional Left foot wound debridement, subcutaneous excisional Left second toe amputation  HEMOSTASIS: Right and left thigh tourniquet  ANESTHESIA: MAC  ESTIMATED BLOOD LOSS: 30 cc  FINDING(S): 1.  Osteomyelitis left second toe 2.  Right midfoot abscess  PATHOLOGY/SPECIMEN(S): Right midfoot abscess soft tissue culture; left second toe bone culture and path specimen with proximal margin marked in purple ink  INDICATIONS:   Tricia [name] is a 63 y.o. female who presents with multiple ulcerations to bilateral lower extremities, abscess right plantar midfoot, and left second toe ulceration with bone exposed.  Patient has had x-ray and MRI imaging of both feet showing abscess to the right plantar midfoot as well as osteomyelitis left second toe.  All treatment options were discussed with the patient both conservative and surgical attempts at correction include potential risks and complications and at this time patient is elected for surgical intervention today.  No guarantees given.  DESCRIPTION: After obtaining full informed written consent, the patient was brought back to the operating room and placed supine upon the operating table.  The patient received IV antibiotics prior to induction.  After obtaining adequate anesthesia, the patient was prepped and draped in the standard fashion.  15 cc of half percent Marcaine  plain was injected about the right ankle and periwound areas of the right lower leg, 15 cc of half percent Marcaine  plain was injected about the left second ray and wound to the left anterior lateral  shin and a Sub lesional/Sub wound block.    An Esmarch bandage was used to exsanguinate the right lower extremity and pneumatic.  Attention was directed to the right plantar midfoot where an incision was made in the arch area around the open wound that was present to the area.  A 3-1 elliptical incision was made around the wound.  The skin was ellipsed and passed off the operative site.  There appeared to be some seropurulent discharge present to the area but did not appear to be probing deeper than the superficial level above the plantar fascia.  The plantar fascia appeared to be intact with no obvious evidence of destruction or necrosis.  Any nonviable tissue was resected and passed off the operative site.  A soft tissue culture was taken from the area and sent off as right midfoot abscess culture.  The Versajet was then used to debride the tissues at further.  3 L normal sterile saline was then used to infiltrate the area to flush the area.  Vancomycin  powder was then placed into the wound.  The skin was then reapproximated well coapted with 3-0 nylon in simple and horizontal mattress type stitching.  Attention was then directed to the wounds to the right lower leg, the proximal anterior lateral leg wound predebridement measured approximately 1 x 0.4 x 0.1 cm, postdebridement was the same measurement.  100% excisional subcutaneous debridement was performed with use of 15 blade and Versajet removing fibrous tissue and biofilm as well as some hyperkeratotic tissue to the area.  Attention was directed to the anterior lateral lower leg where there appeared to be a fluctuant pocket of fluid present to the area, this area was  pressed on and seropurulent discharge and came out from the area.  There appeared to be a wound underneath this area as well that appeared to be subcutaneous in nature and measured approximately 1.5 x 2.8 x 0.2 cm.  Predebridement measurement was 0 as there was not a wound present but after  the abscess was removed then postdebridement was the measurements showed above.  100% excisional subcutaneous wound debridement was performed with Versajet and 15 blade without incident removing any nonviable necrotic tissue, fibrous tissue, and biofilm.  Postoperative dressing was applied to the right foot consisting of Xeroform to the wounds and incision area followed by 4 x 4 gauze, ABD pad, Kerlix, Ace wrap.  The pneumatic thigh tourniquet was deflated and a prompt hyperemic response was noted all digits of the right foot.  Attention was then directed to the left foot where an Esmarch bandage used to exsanguinate the left lower extremity pneumatic thigh tourniquet was inflated.  Attention was then directed to the left second toe where an incision was made over the second metatarsal phalangeal joint and racquet around the second toe at the proximal phalanx base.  The incision was made straight down to bone.  An extensor tenotomy and capsulotomy was performed followed by release of the collateral and suspensory ligaments as well as any connection to the plantar plate and flexor tendon.  The second toe was disarticulated and passed off the operative site.  A bone culture was taken from the wound at the second toe PIPJ and passed off the operative site.  The proximal margin of the second toe amputation site was marked in purple ink and sent off to pathology.  The remainder of the surgical site of the second metatarsal head appeared to be within normal limits with no evidence of infection or signs of osteomyelitis.  The surgical site was flushed with copious amounts normal sterile saline.  The skin was then reapproximated well coapted with 3-0 nylon combination of simple and horizontal mattress type stitching.  Attention was then directed to the left anterior lateral lower leg where the wound measured approximately 5 cm x 2 cm x 0.1 cm, the wound bed appeared to be mixed with fibrous and granular tissue.   Postdebridement measurement appeared to be the same 5 x 2 x 0.1 cm.  100% excisional subcutaneous wound debridement was performed with use of Versajet and 15 blade without incident removing hyperkeratotic tissue, biofilm, fibrous tissue to a healthy bleeding granular base.  Postoperative dressing was applied consisting of Xeroform to the incision site and wound followed by 4 x 4 gauze, Kerlix, Ace wrap.  The pneumatic thigh tourniquet was deflated and a prompt hyperemic response was noted all digits left foot.  Following a period of postoperative monitoring the patient be discharged back to the inpatient room with the appropriate orders, instructions, and medications.  Patient is to remain partial weightbearing to the right foot with heel contact and postop shoe for short distances.  Left foot may be weightbearing as tolerated in postop shoe.  Will follow back up with patient tomorrow and potentially sign off if looking okay with instructions for dressing changes.  COMPLICATIONS: None  CONDITION: stable  Prentice Lee, DPM

## 2023-11-14 NOTE — Assessment & Plan Note (Addendum)
 Wound 09/08/23 1300 Pressure Injury Sacrum Medial Stage 3 -  Full thickness tissue loss. Subcutaneous fat may be visible but bone, tendon or muscle are NOT exposed. (Active)     Wound 09/22/23 2320 Pressure Injury Buttocks Left Stage 1 -  Intact skin with non-blanchable redness of a localized area usually over a bony prominence. (Active)     Wound 09/22/23 2320 Pressure Injury Buttocks Right Stage 2 -  Partial thickness loss of dermis presenting as a shallow open injury with a red, pink wound bed without slough. (Active)     Wound 10/31/23 1900 Pressure Injury Buttocks Bilateral Deep Tissue Pressure Injury - Purple or maroon localized area of discolored intact skin or blood-filled blister due to damage of underlying soft tissue from pressure and/or shear. (Active)   Wound care for sacrum.  Cleanse sacrum with Vashe wound cleanser, rinse well to area dry.  Apply Xeroform Gauze ([id number]) to wound bed daily and secure with silicone foam every other day.  Legs: removed dressings, cleanse wounds with vashe clenser, apply xeroform to leg wounds and incision site, 4x4, abd pad kerlix wrap and ace wrap from forefoot to below knee.

## 2023-11-14 NOTE — Anesthesia Preprocedure Evaluation (Signed)
 Anesthesia Evaluation  Patient identified by MRN, date of birth, ID band Patient awake    Reviewed: Allergy & Precautions, H&P , NPO status , Patient's Chart, lab work & pertinent test results, reviewed documented beta blocker date and time   History of Anesthesia Complications Negative for: history of anesthetic complications  Airway Mallampati: II  TM Distance: >3 FB Neck ROM: full    Dental no notable dental hx. (+) Upper Dentures, Lower Dentures   Pulmonary asthma , COPD, Current Smoker   Pulmonary exam normal breath sounds clear to auscultation       Cardiovascular Exercise Tolerance: Good hypertension, + Peripheral Vascular Disease and +CHF  Normal cardiovascular exam(-) dysrhythmias  Rhythm:regular Rate:Normal     Neuro/Psych  PSYCHIATRIC DISORDERS Anxiety Depression    HOH, hx cholesteatoma  Neuromuscular disease    GI/Hepatic Neg liver ROS,GERD  Controlled and Medicated,,  Endo/Other  diabetes, Type 2    Renal/GU Renal disease     Musculoskeletal   Abdominal   Peds  Hematology negative hematology ROS (+)   Anesthesia Other Findings Past Medical History: No date: Anxiety No date: Arthritis     Comment:  joints and hands/ knees No date: Asthma     Comment:  uses inhaler No date: Benign essential tremor     Comment:  head No date: Cervical dystonia     Comment:  neck pain No date: Cholesteatoma of left ear     Comment:  x2 No date: COPD (chronic obstructive pulmonary disease) (HCC) No date: Cough No date: Depression No date: Diabetes mellitus without complication (HCC)     Comment:  type 2 No date: Diastolic dysfunction No date: Dyspnea No date: Dysrhythmia     Comment:  diastolic dysfunction No date: GERD (gastroesophageal reflux disease) No date: Headache     Comment:  migraines/ one per week No date: HOH (hard of hearing)     Comment:  partially deaf left ear No date: Hyperlipidemia No  date: Hypertension No date: Motion sickness     Comment:  boat No date: Neuromuscular disorder (HCC)     Comment:  neuropathy feet and hands( nerve damage) No date: Wears dentures     Comment:  upper and lower  Past Surgical History: No date: CARPAL TUNNEL RELEASE; Bilateral     Comment:  x2 right, 1x on left No date: COLONOSCOPY 04/25/2017: COLONOSCOPY WITH PROPOFOL ; N/A     Comment:  Procedure: COLONOSCOPY WITH PROPOFOL ;  Surgeon: Jinny Carmine, MD;  Location: [facility];  Service:               Endoscopy;  Laterality: N/A;  diabetic-oral med No date: DILATION AND CURETTAGE OF UTERUS 01/10/2021: ESOPHAGOGASTRODUODENOSCOPY (EGD) WITH PROPOFOL ; N/A     Comment:  Procedure: ESOPHAGOGASTRODUODENOSCOPY (EGD) WITH               PROPOFOL ;  Surgeon: Janalyn Keene NOVAK, MD;  Location:               [facility];  Service: Endoscopy;  Laterality:               N/A;  Diabetic No date: EXTERNAL EAR SURGERY; Left     Comment:  x2 04/25/2017: POLYPECTOMY     Comment:  Procedure: POLYPECTOMY INTESTINAL;  Surgeon: Jinny Carmine, MD;  Location: [facility];  Service:               Endoscopy;; No date: SPINE SURGERY     Comment:  herniated disc No date: TUBAL LIGATION  BMI    Body Mass Index: 22.19 kg/m      Reproductive/Obstetrics negative OB ROS                              Anesthesia Physical Anesthesia Plan  ASA: 3  Anesthesia Plan: General   Post-op Pain Management:    Induction: Intravenous  PONV Risk Score and Plan: 3 and Ondansetron , Dexamethasone , TIVA and Midazolam   Airway Management Planned: LMA  Additional Equipment:   Intra-op Plan:   Post-operative Plan: Extubation in OR  Informed Consent: I have reviewed the patients History and Physical, chart, labs and discussed the procedure including the risks, benefits and alternatives for the proposed anesthesia with the patient or authorized  representative who has indicated his/her understanding and acceptance.     Dental Advisory Given  Plan Discussed with: Anesthesiologist, CRNA and Surgeon  Anesthesia Plan Comments: (Patient consented for risks of anesthesia including but not limited to:  - adverse reactions to medications - damage to eyes, teeth, lips or other oral mucosa - nerve damage due to positioning  - sore throat or hoarseness - Damage to heart, brain, nerves, lungs, other parts of body or loss of life  Patient voiced understanding and assent.)        Anesthesia Quick Evaluation

## 2023-11-14 NOTE — Plan of Care (Signed)
  Problem: Fluid Volume: Goal: Hemodynamic stability will improve Outcome: Progressing   Problem: Coping: Goal: Ability to adjust to condition or change in health will improve Outcome: Progressing   Problem: Nutrition: Goal: Adequate nutrition will be maintained Outcome: Progressing   Problem: Coping: Goal: Level of anxiety will decrease Outcome: Progressing   Problem: Safety: Goal: Ability to remain free from injury will improve Outcome: Progressing

## 2023-11-14 NOTE — TOC Progression Note (Signed)
 Transition of Care [facility]) - Progression Note    Patient Details  Name: Tricia [name] MRN: [id number] Date of Birth: 03-19-1960  Transition of [facility]) CM/SW Contact  Insiya Oshea  [place], [place] Phone Number: 11/14/2023, 3:08 PM  Clinical Narrative:   MD advised [facility] to start insurance auth for patient to admit to [facility] when medically ready. [facility] started insurance auth. [facility] Admin coordinator is aware and has bed availability.    Pending [id number]   TOC to follow for discharge                    Expected Discharge Plan and Services                                               Social Drivers of Health (SDOH) Interventions SDOH Screenings   Food Insecurity: Patient Unable To Answer (10/31/2023)  Housing: Unknown (10/31/2023)  Transportation Needs: Patient Unable To Answer (10/31/2023)  Utilities: Patient Unable To Answer (10/31/2023)  Alcohol Screen: Low Risk  (03/07/2023)  Depression (PHQ2-9): High Risk (05/01/2023)  Financial Resource Strain: Patient Declined (06/18/2023)   Received from [facility]  Physical Activity: Sufficiently Active (03/07/2023)  Social Connections: Socially Isolated (08/29/2023)  Stress: No Stress Concern Present (03/07/2023)  Tobacco Use: High Risk (11/14/2023)  Health Literacy: Adequate Health Literacy (03/07/2023)    Readmission Risk Interventions    11/02/2023    1:21 PM  Readmission Risk Prevention Plan  Transportation Screening Complete  Medication Review (RN Care Manager) Complete  PCP or Specialist appointment within 3-5 days of discharge Complete  SW Recovery Care/Counseling Consult Complete  Palliative Care Screening Not Applicable  Skilled Nursing Facility Not Applicable

## 2023-11-14 NOTE — Anesthesia Procedure Notes (Signed)
 Procedure Name: LMA Insertion Date/Time: 11/14/2023 12:08 PM  Performed by: Kiara Keep R, CRNAPre-anesthesia Checklist: Patient identified, Emergency Drugs available, Suction available, Patient being monitored and Timeout performed Patient Re-evaluated:Patient Re-evaluated prior to induction Oxygen  Delivery Method: Circle system utilized Preoxygenation: Pre-oxygenation with 100% oxygen  Induction Type: IV induction LMA: LMA inserted LMA Size: 3.0 Number of attempts: 1 Placement Confirmation: positive ETCO2 and breath sounds checked- equal and bilateral Tube secured with: Tape Dental Injury: Teeth and Oropharynx as per pre-operative assessment

## 2023-11-14 NOTE — Assessment & Plan Note (Signed)
 Replaced

## 2023-11-14 NOTE — H&P (Signed)
 HISTORY AND PHYSICAL INTERVAL NOTE:  11/14/2023  11:54 AM  Tricia [name]  has presented today for surgery, with the diagnosis of Abscess right foot secondary to diabetic foot ulceration Osteomyelitis left second toe with associated diabetic foot ulceration Ulcerations to both lower legs, dermal.  The various methods of treatment have been discussed with the patient.  No guarantees were given.  After consideration of risks, benefits and other options for treatment, the patient has consented to surgery.  I have reviewed the patients' chart and labs.   PROCEDURE: RIGHT FOOT INCISION AND DRAINAGE BILATERAL LEG WOUND DEBRIDEMENTS LEFT 2ND TOE AMPUTATION    A history and physical examination was performed in the hospital.  The patient was reexamined.  There have been no changes to this history and physical examination.  Prentice Lee, DPM

## 2023-11-14 NOTE — Progress Notes (Signed)
 OT Cancellation Note  Patient Details Name: Tricia [name] MRN: [id number] DOB: [birth year]   Cancelled Treatment:    Reason Eval/Treat Not Completed: Patient at procedure or test/ unavailable. Pt currently off unit in surgery, will require new OT orders prior to next visit. OT will re attempt when pt is medically ready.  Larraine Colas M.S. OTR/L  11/14/23, 11:17 AM

## 2023-11-14 NOTE — Transfer of Care (Signed)
 Immediate Anesthesia Transfer of Care Note  [name]  Procedure(s) Performed: IRRIGATION AND DEBRIDEMENT FOOT (Right: Foot) AMPUTATION, TOE (Left: Toe) DEBRIDEMENT, WOUND (Bilateral: Leg Lower)  Patient Location: PACU  Anesthesia Type:General  Level of Consciousness: awake  Airway & Oxygen  Therapy: Patient Spontanous Breathing and Patient connected to face mask oxygen   Post-op Assessment: Report given to RN and Post -op Vital signs reviewed and stable  Post vital signs: Reviewed and stable  Last Vitals:  Vitals Value Taken Time  BP 101/53 11/14/23 13:47  Temp    Pulse 74 11/14/23 13:54  Resp 8 11/14/23 13:54  SpO2 97 % 11/14/23 13:54  Vitals shown include unfiled device data.  Last Pain:  Vitals:   11/14/23 1016  TempSrc:   PainSc: Asleep      Patients Stated Pain Goal: 5 (11/14/23 0524)  Complications: There were no known notable events for this encounter.

## 2023-11-14 NOTE — Consult Note (Signed)
 PHARMACY CONSULT NOTE - ELECTROLYTES  Pharmacy Consult for Electrolyte Monitoring and Replacement   Recent Labs: Height: 5' 8 (172.7 cm) Weight: 66.2 kg (145 lb 15.1 oz) IBW/kg (Calculated) : 63.9 Estimated Creatinine Clearance: 52.3 mL/min (A) (by C-G formula based on SCr of 1.11 mg/dL (H)). Potassium (mmol/L)  Date Value  11/14/2023 3.4 (L)  11/16/2013 4.0   Magnesium  (mg/dL)  Date Value  90/74/7974 1.6 (L)   Calcium  (mg/dL)  Date Value  90/74/7974 7.3 (L)   Calcium , Total (mg/dL)  Date Value  90/71/7984 7.9 (L)   Albumin  (g/dL)  Date Value  90/75/7974 1.7 (L)  07/06/2015 4.2  10/07/2012 2.9 (L)   Phosphorus (mg/dL)  Date Value  90/79/7974 2.7   Sodium (mmol/L)  Date Value  11/14/2023 137  07/06/2015 146 (H)  11/16/2013 131 (L)   Assessment  Tricia [name] is a 63 y.o. female presenting with weakness and leg swelling. PMH significant for anxiety, osteoarthritis, asthma, COPD, depression, type 2 diabetes mellitus, GERD, dyslipidemia, and hypertension. Pharmacy has been consulted to monitor and replace electrolytes.  Diet: NPO (sips with meds) MIVF: N/A Pertinent medications: N/A  Goal of Therapy: Electrolytes WNL  Plan:  K 3.4: Provider has ordered KCL 40 mEq PO x1 Mag 1.6: Provider has ordered magnesium  sulfate 2 g IV x1 Check BMP, Mg, Phos with tomorrow AM labs  Thank you for involving pharmacy in this patient's care.   Damien Napoleon, PharmD Clinical Pharmacist 11/14/2023 7:20 AM

## 2023-11-15 ENCOUNTER — Encounter: Payer: Self-pay | Admitting: Podiatry

## 2023-11-15 DIAGNOSIS — B9689 Other specified bacterial agents as the cause of diseases classified elsewhere: Secondary | ICD-10-CM

## 2023-11-15 DIAGNOSIS — R7881 Bacteremia: Secondary | ICD-10-CM

## 2023-11-15 DIAGNOSIS — L0390 Cellulitis, unspecified: Secondary | ICD-10-CM | POA: Diagnosis not present

## 2023-11-15 DIAGNOSIS — R710 Precipitous drop in hematocrit: Secondary | ICD-10-CM | POA: Diagnosis not present

## 2023-11-15 DIAGNOSIS — A419 Sepsis, unspecified organism: Secondary | ICD-10-CM | POA: Diagnosis not present

## 2023-11-15 DIAGNOSIS — L97522 Non-pressure chronic ulcer of other part of left foot with fat layer exposed: Secondary | ICD-10-CM | POA: Diagnosis not present

## 2023-11-15 DIAGNOSIS — S81801A Unspecified open wound, right lower leg, initial encounter: Secondary | ICD-10-CM

## 2023-11-15 DIAGNOSIS — Z89422 Acquired absence of other left toe(s): Secondary | ICD-10-CM

## 2023-11-15 DIAGNOSIS — M86172 Other acute osteomyelitis, left ankle and foot: Secondary | ICD-10-CM

## 2023-11-15 LAB — PHOSPHORUS: Phosphorus: 3 mg/dL (ref 2.5–4.6)

## 2023-11-15 LAB — BODY FLUID CULTURE W GRAM STAIN
Culture: NO GROWTH
Gram Stain: NONE SEEN

## 2023-11-15 LAB — BASIC METABOLIC PANEL WITH GFR
Anion gap: 9 (ref 5–15)
BUN: 14 mg/dL (ref 8–23)
CO2: 24 mmol/L (ref 22–32)
Calcium: 7.5 mg/dL — ABNORMAL LOW (ref 8.9–10.3)
Chloride: 103 mmol/L (ref 98–111)
Creatinine, Ser: 1.1 mg/dL — ABNORMAL HIGH (ref 0.44–1.00)
GFR, Estimated: 56 mL/min — ABNORMAL LOW (ref >60)
Glucose, Bld: 78 mg/dL (ref 70–99)
Potassium: 3.6 mmol/L (ref 3.5–5.1)
Sodium: 136 mmol/L (ref 135–145)

## 2023-11-15 LAB — MAGNESIUM: Magnesium: 1.9 mg/dL (ref 1.7–2.4)

## 2023-11-15 LAB — GLUCOSE, CAPILLARY
Glucose-Capillary: 84 mg/dL (ref 70–99)
Glucose-Capillary: 132 mg/dL — ABNORMAL HIGH (ref 70–99)
Glucose-Capillary: 89 mg/dL (ref 70–99)

## 2023-11-15 MED ORDER — POTASSIUM CHLORIDE CRYS ER 20 MEQ PO TBCR
20.0000 meq | EXTENDED_RELEASE_TABLET | ORAL | Status: AC
  Administered 2023-11-15 (×2): 20 meq via ORAL
  Filled 2023-11-15 (×2): qty 1

## 2023-11-15 MED ORDER — ACETAMINOPHEN 325 MG PO TABS
650.0000 mg | ORAL_TABLET | Freq: Four times a day (QID) | ORAL | Status: DC | PRN
  Administered 2023-11-16: 650 mg via ORAL
  Filled 2023-11-15: qty 2

## 2023-11-15 MED ORDER — MAGNESIUM SULFATE 2 GM/50ML IV SOLN
2.0000 g | Freq: Once | INTRAVENOUS | Status: AC
  Administered 2023-11-15: 2 g via INTRAVENOUS
  Filled 2023-11-15: qty 50

## 2023-11-15 NOTE — Progress Notes (Signed)
 Progress Note   [name] FMW:[id number] DOB: 1961-01-13 DOA: 10/30/2023     16 DOS: the patient was seen and examined on 11/15/2023   Brief hospital course: 63 y.o. female with a PMH significant for anxiety, osteoarthritis, asthma, COPD, depression, type 2 diabetes mellitus, GERD, dyslipidemia, and HTN, who presented to the emergency room with acute onset of generalized weakness and bilateral lower extremity swelling after being found down after several hours.   ED Course: BP was 109/53 with heart rate of 114 and later BP was 96/52 and temperature was 99.8, respiratory rate was 22. Labs reveal hyponatremia 128 and hyperkalemia of 6 with a CO2 of 13 and glucose of 126, BUN of 42 and creatinine 3.64 calcium  of 7.8 and albumin  2 with total protein 5.7.  BNP was 78.5 and high-sensitivity troponin I was 14.  Lactic acid was 2.6 and CBC showed hemoglobin 11.8 and hematocrit 36.9.  Respiratory panel came back negative.  Blood cultures were drawn. EKG: sinus tachycardia with rate 118 with poor R wave progression. Imaging: Portable chest x-ray showed emphysema with no acute cardiopulmonary disease. The patient was given IV vancomycin  and  IV cefepime .  On initial evaluation- patient was lethargic and minimally responsive. BP low and not responding to IV fluids or increase in midodrine . CCM was consulted who took over care for patient who required vasopressors. Weaned from them 9/14. NG tube was placed for nutrition as she was unable to tolerate PO while incapacitated.  9/10.  Blood cultures positive for Serratia. 9/15: transferred back to [facility]   11/12/23 -patient BP stable, will wean midodrine . PO intake is low but consistent, will dc NG tube and add PO supplements.  Respiratory status- improved back to baseline O2 use. 9/24.  I was able to squeeze out some pus from her right foot.  MRI did not show any evidence of osteomyelitis and no organized fluid collection identified.  Case  discussed with podiatry and he would like to take to the operating room for washout procedure.  MRI of the left foot ordered because he does not like the way a toe on the left foot looks.  Stopped Augmentin  and started Rocephin  9/25.  Dr. Lennie podiatry took to the operating room for right foot incision and drainage of right midfoot abscess, right foot wound debridement, left foot wound debridement and left second toe amputation 9/26.  Serratia growing out of wound culture.  Case discussed with infectious disease specialist and can continue Rocephin  while here and then likely switch over to Cipro  for around another week upon discharge.  Assessment and Plan: * Sepsis due to cellulitis [facility]) Present on admission.  Cellulitis and abscess foot.  Blood cultures on 9/10 grew out Serratia.  Wound culture on 9/23 and 9/24 growing Serratia also.  MRI does not show osteomyelitis or an abscess.  Podiatry brought to the operating room 9/25 for incision and drainage of abscess right foot.  Patient on Rocephin .  ID consultation recommended continuing Rocephin  while here and switching over to Cipro  for another week upon discharge.  Heel contact (partial weightbearing) with right foot and postop shoe.  Toe ulcer (HCC) Second toe left foot suspected osteomyelitis.  Dr. Lennie took to the operating room on 9/25 second toe amputation on left foot.  Can weight-bear as tolerated with postop shoe  Drop in hemoglobin Last hemoglobin down to 7.5.   May end up needing a blood transfusion.  No signs of bleeding.  Did have a thoracentesis.  Ferritin elevated  going along with anemia of chronic disease.  Pleural effusion on right Interventional radiology drew off 800 mL of fluid on 9/23.  AKI (acute kidney injury) Creatinine 3.64 on presentation and currently 1.11  GERD without esophagitis Continue PPI therapy.  Parkinson's disease (HCC) - Will continue Sinemet  CR and Sinemet  IR.  Dyslipidemia Will continue statin  therapy.  Chronic diastolic CHF (congestive heart failure) (HCC) On low-dose torsemide   Hyponatremia Last sodium normal range  Hyperkalemia Resolved after acute kidney injury improved.  Now with hypokalemia.  Asthma, chronic Will continue theophylline , Singulair , and bronchodilator inhaler.  Hypomagnesemia Replaced  Pressure injury Wound 09/08/23 1300 Pressure Injury Sacrum Medial Stage 3 -  Full thickness tissue loss. Subcutaneous fat may be visible but bone, tendon or muscle are NOT exposed. (Active)     Wound 09/22/23 2320 Pressure Injury Buttocks Left Stage 1 -  Intact skin with non-blanchable redness of a localized area usually over a bony prominence. (Active)     Wound 09/22/23 2320 Pressure Injury Buttocks Right Stage 2 -  Partial thickness loss of dermis presenting as a shallow open injury with a red, pink wound bed without slough. (Active)     Wound 10/31/23 1900 Pressure Injury Buttocks Bilateral Deep Tissue Pressure Injury - Purple or maroon localized area of discolored intact skin or blood-filled blister due to damage of underlying soft tissue from pressure and/or shear. (Active)      Protein-calorie malnutrition, severe Continue supplements  Gout Continue colchicine .       Subjective: Patient states she is in a lot of pain requiring IV morphine  to help control pain along with oral medications.  Went to the operating room yesterday.  Physical Exam: Vitals:   11/14/23 2005 11/15/23 0334 11/15/23 0424 11/15/23 0912  BP:  130/71  (!) 140/79  Pulse:  98  95  Resp:  19  17  Temp:  98.4 F (36.9 C)  98.6 F (37 C)  TempSrc:      SpO2: 97% 95%  94%  Weight:   63.6 kg   Height:       Physical Exam HENT:     Head: Normocephalic.  Eyes:     General: Lids are normal.     Conjunctiva/sclera: Conjunctivae normal.  Cardiovascular:     Rate and Rhythm: Normal rate and regular rhythm.     Heart sounds: Normal heart sounds, S1 normal and S2 normal.   Pulmonary:     Breath sounds: No decreased breath sounds, wheezing, rhonchi or rales.  Abdominal:     Palpations: Abdomen is soft.     Tenderness: There is no abdominal tenderness.  Musculoskeletal:     Right lower leg: Swelling present.     Left lower leg: Swelling present.  Skin:    General: Skin is warm.  Neurological:     Mental Status: She is alert and oriented to person, place, and time.     Data Reviewed: Serratia growing out of right foot culture.  Also grew out of blood culture from 9/10. Creatinine 1.1, hemoglobin 7.5  Family Communication: Spoke with Ozell on the phone  Disposition: Status is: Inpatient Remains inpatient appropriate because: Control pain with IV and oral pain medications today.  Reassess tomorrow.  Planned Discharge Destination: Rehab    Time spent: 28 minutes Case discussed with podiatry and infectious disease specialist Author: Charlie Patterson, MD 11/15/2023 3:07 PM  For on call review [email or web address].

## 2023-11-15 NOTE — Evaluation (Signed)
 Occupational Therapy Re-Evaluation Patient Details Name: Tricia [name] MRN: [id number] DOB: Aug 15, 1960 Today's Date: 11/15/2023   History of Present Illness   Pt is a 63 y.o. female with medical history significant of COPD, prediabetes, hyperlipidemia, GERD, migraine headaches, anxiety, depression, benign essential tremor that presented to ED for BLE swelling, weakness. Workup for cellulitis of BLE, AKI, admission complicated by septic shock and acute COPD exacerbation. Pt underwent R/L foot I&D and L 2nd toe amputation on 9/25.     Clinical Impressions Tricia [name] was seen for OT/PT Re-evaluation s/p recent R/L I&D as described above. Pt cognition is much improved from previous admission with pt remaining A&O x4 t/o session. Pt continues to present with deficits in strength, pain control, balance, and activity tolerance, affecting safe and optimal ADL completion. Pt currently requires +2 Min-MOD A for functional transfers and LB ADL management from STS, CGA for safety with log-roll bed mobility this date.  Pt would benefit from skilled OT services to address noted impairments and functional limitations (see below for any additional details) in order to maximize safety and independence while minimizing future risk of falls, injury, and readmission. Goals updated to reflect pt status/progress. Continue to anticipate the need for follow up OT services upon acute hospital DC.      If plan is discharge home, recommend the following:   Assistance with cooking/housework;Assist for transportation;Help with stairs or ramp for entrance;Supervision due to cognitive status;Direct supervision/assist for medications management;Two people to help with walking and/or transfers;A lot of help with bathing/dressing/bathroom     Functional Status Assessment   Patient has had a recent decline in their functional status and demonstrates the ability to make significant improvements in function in a  reasonable and predictable amount of time.     Equipment Recommendations   Other (comment) (defer to next venue of care)     Recommendations for Other Services         Precautions/Restrictions   Precautions Precautions: Fall Recall of Precautions/Restrictions: Intact Required Braces or Orthoses: Other Brace Other Brace: bilateral post-op shoes Restrictions Weight Bearing Restrictions Per Provider Order: Yes RLE Weight Bearing Per Provider Order: Partial weight bearing RLE Partial Weight Bearing Percentage or Pounds: heel WB in post-op shoe LLE Weight Bearing Per Provider Order: Weight bearing as tolerated     Mobility Bed Mobility Overal bed mobility: Needs Assistance Bed Mobility: Rolling, Sidelying to Sit Rolling: Supervision       Sit to sidelying: Contact guard assist, Used rails General bed mobility comments: no physical assistance required, VC for bed rail assist    Transfers Overall transfer level: Needs assistance Equipment used: Rolling walker (2 wheels) Transfers: Sit to/from Stand, Bed to chair/wheelchair/BSC Sit to Stand: Min assist, +2 physical assistance, From elevated surface     Step pivot transfers: +2 safety/equipment, Mod assist            Balance                                           ADL either performed or assessed with clinical judgement   ADL                           Toilet Transfer: Moderate assistance;+2 for physical assistance;Rolling walker (2 wheels) Toilet Transfer Details (indicate cue type and reason): Able to step pivot for simulated toilet  transfer in bilat post op shoes. Min cueing for sequencing and following WB precautions.         Functional mobility during ADLs: Moderate assistance;Rolling walker (2 wheels);+2 for safety/equipment General ADL Comments: CGA for safety with bed mobility. Educated on log roll technique for improved comfort/safety.     Vision Baseline  Vision/History: 1 Wears glasses Ability to See in Adequate Light: 1 Impaired       Perception         Praxis         Pertinent Vitals/Pain Pain Assessment Pain Assessment: 0-10 Pain Score: 10-Worst pain ever Pain Location: BLE Pain Descriptors / Indicators: Grimacing, Guarding, Sore Pain Intervention(s): Limited activity within patient's tolerance, Monitored during session, Patient requesting pain meds-RN notified, Repositioned     Extremity/Trunk Assessment Upper Extremity Assessment Upper Extremity Assessment: Generalized weakness   Lower Extremity Assessment Lower Extremity Assessment: Generalized weakness       Communication Communication Communication: No apparent difficulties   Cognition Arousal: Alert Behavior During Therapy: WFL for tasks assessed/performed Cognition: No family/caregiver present to determine baseline             OT - Cognition Comments: Improved cognition from previous evaluation. A&O x4, follows VCs consistently, pain limited but participates well t/o session.                 Following commands: Intact Following commands impaired: Follows one step commands with increased time     Cueing  General Comments   Cueing Techniques: Verbal cues;Visual cues      Exercises Other Exercises Other Exercises: Pt educated on role of OT in acute setting, safety, falls prevention, precautions, safe use of post op shoes, and transfer techniques. See ADL section for additional details.   Shoulder Instructions      Home Living                                          Prior Functioning/Environment                      OT Problem List: Decreased strength;Decreased coordination;Decreased range of motion;Decreased activity tolerance;Decreased safety awareness;Impaired balance (sitting and/or standing);Decreased knowledge of use of DME or AE;Pain;Decreased knowledge of precautions   OT Treatment/Interventions:  Self-care/ADL training;Therapeutic exercise;Therapeutic activities;DME and/or AE instruction;Patient/family education;Balance training;Cognitive remediation/compensation      OT Goals(Current goals can be found in the care plan section)   Acute Rehab OT Goals Patient Stated Goal: to feel better/have less pain OT Goal Formulation: With patient Time For Goal Achievement: 11/29/23 Potential to Achieve Goals: Good   OT Frequency:  Min 2X/week    Co-evaluation   Reason for Co-Treatment: Complexity of the patient's impairments (multi-system involvement);For patient/therapist safety PT goals addressed during session: Mobility/safety with mobility;Proper use of DME;Strengthening/ROM OT goals addressed during session: ADL's and self-care;Proper use of Adaptive equipment and DME      AM-PAC OT 6 Clicks Daily Activity     Outcome Measure Help from another person eating meals?: A Little Help from another person taking care of personal grooming?: A Little Help from another person toileting, which includes using toliet, bedpan, or urinal?: A Lot Help from another person bathing (including washing, rinsing, drying)?: A Lot Help from another person to put on and taking off regular upper body clothing?: A Lot Help from another person to put on and taking off  regular lower body clothing?: A Lot 6 Click Score: 14   End of Session Equipment Utilized During Treatment: Rolling walker (2 wheels);Gait belt Nurse Communication: Mobility status;Patient requests pain meds  Activity Tolerance: Patient tolerated treatment well Patient left: with call bell/phone within reach;in chair;with chair alarm set  OT Visit Diagnosis: Other abnormalities of gait and mobility (R26.89);Muscle weakness (generalized) (M62.81);Pain Pain - Right/Left: Right Pain - part of body: Knee;Leg;Ankle and joints of foot                Time: 8976-8951 OT Time Calculation (min): 25 min Charges:  OT General Charges $OT Visit:  1 Visit OT Evaluation $OT Re-eval: 1 Re-eval OT Treatments $Self Care/Home Management : 8-22 mins  Jhonny Pelton, M.S., OTR/L 11/15/23, 3:04 PM

## 2023-11-15 NOTE — Consult Note (Addendum)
 PHARMACY CONSULT NOTE - ELECTROLYTES  Pharmacy Consult for Electrolyte Monitoring and Replacement   Recent Labs: Height: 5' 8 (172.7 cm) Weight: 63.6 kg (140 lb 3.4 oz) IBW/kg (Calculated) : 63.9 Estimated Creatinine Clearance: 52.6 mL/min (A) (by C-G formula based on SCr of 1.1 mg/dL (H)). Potassium (mmol/L)  Date Value  11/15/2023 3.6  11/16/2013 4.0   Magnesium  (mg/dL)  Date Value  90/73/7974 1.9   Calcium  (mg/dL)  Date Value  90/73/7974 7.5 (L)   Calcium , Total (mg/dL)  Date Value  90/71/7984 7.9 (L)   Albumin  (g/dL)  Date Value  90/75/7974 1.7 (L)  07/06/2015 4.2  10/07/2012 2.9 (L)   Phosphorus (mg/dL)  Date Value  90/73/7974 3.0   Sodium (mmol/L)  Date Value  11/15/2023 136  07/06/2015 146 (H)  11/16/2013 131 (L)   Assessment  Tricia [name] is a 63 y.o. female presenting with weakness and leg swelling. PMH significant for anxiety, osteoarthritis, asthma, COPD, depression, type 2 diabetes mellitus, GERD, dyslipidemia, and hypertension. Pharmacy has been consulted to monitor and replace electrolytes.  Diet: regular MIVF: N/A Pertinent medications: torsemide  20mg  PO daily  Goal of Therapy: Electrolytes WNL  Plan: K 3.6 >> potassium chloride  20 mEq PO q3H x 2 Mg 1.9 >> magnesium  chloride 2g IV x 1 Check BMP, Mg with tomorrow AM labs  Thank you for involving pharmacy in this patient's care.   Will M. Lenon, PharmD, BCPS Clinical Pharmacist 11/15/2023 7:36 AM

## 2023-11-15 NOTE — Progress Notes (Addendum)
 Physical Therapy Treatment/Re-Evaluation Patient Details Name: Tricia [name] MRN: [id number] DOB: [birth year] Today's Date: 11/15/2023   History of Present Illness Pt is a 63 y.o. female with medical history significant of COPD, prediabetes, hyperlipidemia, GERD, migraine headaches, anxiety, depression, benign essential tremor that presented to ED for BLE swelling, weakness. Workup for cellulitis of BLE, AKI, admission complicated by septic shock and acute COPD exacerbation. Pt underwent R/L foot I&D and L 2nd toe amputation on 9/25.    PT Comments  Pt A&Ox4, agreeable to PT/OT co-tx, cited bilateral leg pain of 10/10 intensity at start of session- RN informed. Pt was met supine in bed, CGA for bed mobility with inc time/effort and VC for bed rail assist. MaxA to don post-op shoes, pt able to assist by managing BLE movement. STS from elevated EOB with RW and minAx2. Able to perform step-pivot transfer with modAx2 to guide hips toward recliner, VC to ensure heel WB adherence. Pt was left seated in recliner at end of session, all needs in reach. The patient would benefit from further skilled PT intervention to continue to progress towards goals.     If plan is discharge home, recommend the following: Two people to help with walking and/or transfers;Two people to help with bathing/dressing/bathroom;Help with stairs or ramp for entrance;Assist for transportation;Assistance with feeding;Assistance with cooking/housework   Can travel by private vehicle     No  Equipment Recommendations  Other (comment) (TBD)    Recommendations for Other Services       Precautions / Restrictions Precautions Precautions: Fall Recall of Precautions/Restrictions: Intact Required Braces or Orthoses: Other Brace Other Brace: bilateral post-op shoes Restrictions Weight Bearing Restrictions Per Provider Order: Yes RLE Weight Bearing Per Provider Order: Partial weight bearing RLE Partial Weight Bearing  Percentage or Pounds: heel WB in post-op shoe LLE Weight Bearing Per Provider Order: Weight bearing as tolerated (in post-op shoe)     Mobility  Bed Mobility Overal bed mobility: Needs Assistance Bed Mobility: Supine to Sit     Supine to sit: Contact guard, HOB elevated, Used rails     General bed mobility comments: no physical assistance required, VC for bed rail assist    Transfers Overall transfer level: Needs assistance Equipment used: Rolling walker (2 wheels) Transfers: Sit to/from Stand, Bed to chair/wheelchair/BSC Sit to Stand: Min assist, +2 physical assistance, From elevated surface   Step pivot transfers: Mod assist, +2 safety/equipment       General transfer comment: STS from elevated EOB with minAx2, VC for hand placement. ModAx2 step-pivot transfer to guide hips to recliner    Ambulation/Gait                   Stairs             Wheelchair Mobility     Tilt Bed    Modified Rankin (Stroke Patients Only)       Balance Overall balance assessment: Needs assistance Sitting-balance support: Feet supported Sitting balance-Leahy Scale: Fair Sitting balance - Comments: requires UE support for dynamic sitting   Standing balance support: Bilateral upper extremity supported, Reliant on assistive device for balance Standing balance-Leahy Scale: Poor Standing balance comment: heavy BUE support on RW                            Communication Communication Communication: No apparent difficulties  Cognition Arousal: Alert Behavior During Therapy: WFL for tasks assessed/performed   PT - Cognitive impairments: No  apparent impairments                       PT - Cognition Comments: A&Ox4 Following commands: Intact      Cueing Cueing Techniques: Verbal cues, Visual cues  Exercises      General Comments        Pertinent Vitals/Pain Pain Assessment Pain Assessment: 0-10 Pain Score: 10-Worst pain ever Pain Location:  BLE Pain Intervention(s): Limited activity within patient's tolerance, Monitored during session, Repositioned, Patient requesting pain meds-RN notified    Home Living                          Prior Function            PT Goals (current goals can now be found in the care plan section) Progress towards PT goals: Progressing toward goals    Frequency    Min 2X/week      PT Plan      Co-evaluation PT/OT/SLP Co-Evaluation/Treatment: Yes Reason for Co-Treatment: Complexity of the patient's impairments (multi-system involvement);For patient/therapist safety PT goals addressed during session: Mobility/safety with mobility;Proper use of DME;Strengthening/ROM        AM-PAC PT 6 Clicks Mobility   Outcome Measure  Help needed turning from your back to your side while in a flat bed without using bedrails?: A Little Help needed moving from lying on your back to sitting on the side of a flat bed without using bedrails?: A Little Help needed moving to and from a bed to a chair (including a wheelchair)?: A Lot Help needed standing up from a chair using your arms (e.g., wheelchair or bedside chair)?: A Lot Help needed to walk in hospital room?: Total Help needed climbing 3-5 steps with a railing? : Total 6 Click Score: 12    End of Session Equipment Utilized During Treatment: Gait belt Activity Tolerance: Patient limited by pain Patient left: in chair;with call bell/phone within reach;with chair alarm set Nurse Communication: Mobility status PT Visit Diagnosis: Other abnormalities of gait and mobility (R26.89);Difficulty in walking, not elsewhere classified (R26.2);Muscle weakness (generalized) (M62.81);Pain Pain - part of body: Leg;Ankle and joints of foot     Time: 8976-8952 PT Time Calculation (min) (ACUTE ONLY): 24 min  Charges:    $Therapeutic Activity: 8-22 mins PT General Charges $$ ACUTE PT VISIT: 1 Visit %RE-EVAL Charge                     Janell Axe, SPT

## 2023-11-15 NOTE — Consult Note (Addendum)
 NAME: Tricia [name]  DOB: Feb 07, 1961  MRN: [id number]  Date/Time: 11/15/2023 10:57 AM  REQUESTING PROVIDER: Dr.Wieting Subjective:  REASON FOR CONSULT: serratia infection ? Tricia [name] is a 63 y.o. with a history of COPD, hypertension, hyperlipidemia, falls, chronic pain, with multiple hospitalization in the past 3 months Patient presented to the ED on 10/30/2023 with weakness and leg swelling. Patient has had bilateral lower extremity swelling over the past few days with increasing weakness.  She did not have any chest pain or shortness of breath.  She had fallen and was found on the ground for several hours and she was unable to get up and walk  Vitals in the ED BP of 82/49, pulse 114, temperature 99.3 and sats of 97% Labs revealed a sodium of 128, potassium of 6 CO2 of 13 and glucose of 126.  BUN was 40 and creatinine was 3.64.  Albumin  was 2.  BNP was 78.5.  Lactate was 2.6.  Hemoglobin was 11.8.  WBC initially was 10.1 but later it increased to 17.1.  She was admitted to the ICU for septic shock and AKI and COPD exacerbation..  2 different set of blood cultures drawn on 10/30/2023 had Serratia  She was treated with IV cefepime  and then ceftriaxone  for 7 days total On 11/04/2023 she was transferred to the hospitalist service She was also seen by nephrologist when she was in the ICU and was managed without dialysis.  Had a Foley catheter which improved the creatinine On 11/12/2023 a right foot ulceration on the plantar surface was noted by the nursing team  and podiatrist was consulted She had wounds to both her shins and a left second toe ulceration MRI imaging performed to the right foot showed potentially a small superficial abscess present to the midfoot.  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  Frequent fall there was urinary retention and she had Foley placed and then it was removed.  She had Klebsiella UTI then.  Chronic pain with chronic prescription for opioid use.  Was on Xtampza .  This was stopped. [facility]  Patient came to ED on 10/23/2023 for a skin tear from home secondary to a fall and the walker scraping her left leg   Past Medical History:  Diagnosis Date   Anxiety    Arthritis    joints and hands/ knees   Asthma    uses inhaler   Benign essential tremor    head   Cervical dystonia    neck pain   Cholesteatoma of left ear    x2   COPD (chronic obstructive pulmonary disease) (HCC)    Cough    Depression    Diabetes mellitus without complication (HCC)    type 2   Diastolic dysfunction    Dyspnea    Dysrhythmia  diastolic dysfunction   GERD (gastroesophageal reflux disease)    Headache    migraines/ one per week   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   Hyperlipidemia    Hypertension    Motion sickness    boat   Neuromuscular disorder (HCC)    neuropathy feet and hands( nerve damage)   Wears dentures    upper and lower    Past Surgical History:  Procedure Laterality Date   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   Procedure: AMPUTATION, TOE;  Surgeon: Lennie Barter, DPM;  Location: [facility];  Service: Orthopedics/Podiatry;  Laterality: Left;  2nd Toe amputation   CARPAL TUNNEL RELEASE Bilateral    x2 right, 1x on left   COLONOSCOPY     COLONOSCOPY  WITH PROPOFOL  N/A 04/25/2017   Procedure: COLONOSCOPY WITH PROPOFOL ;  Surgeon: Jinny Carmine, MD;  Location: [facility];  Service: Endoscopy;  Laterality: N/A;  diabetic-oral med   DILATION AND CURETTAGE OF UTERUS     ESOPHAGOGASTRODUODENOSCOPY (EGD) WITH PROPOFOL  N/A 01/10/2021   Procedure: ESOPHAGOGASTRODUODENOSCOPY (EGD) WITH PROPOFOL ;  Surgeon: Janalyn Keene NOVAK, MD;  Location: [facility];  Service: Endoscopy;  Laterality: N/A;  Diabetic   EXTERNAL EAR SURGERY Left    x2   IRRIGATION AND DEBRIDEMENT FOOT Right 11/14/2023   Procedure: IRRIGATION AND DEBRIDEMENT FOOT;  Surgeon: Lennie Barter, DPM;  Location: [facility];  Service: Orthopedics/Podiatry;  Laterality: Right;   POLYPECTOMY  04/25/2017   Procedure: POLYPECTOMY INTESTINAL;  Surgeon: Jinny Carmine, MD;  Location: [facility];  Service: Endoscopy;;   SPINE SURGERY     herniated disc   TUBAL LIGATION     WOUND DEBRIDEMENT Bilateral 11/14/2023   Procedure: DEBRIDEMENT, WOUND;  Surgeon: Lennie Barter, DPM;  Location: [facility];  Service: Orthopedics/Podiatry;  Laterality: Bilateral;    Social History   Socioeconomic History   Marital status: Widowed    Spouse name: Not on file   Number of children: 2   Years of education: Not on file   Highest education level: Associate degree: academic program  Occupational History   Occupation: Disability  Tobacco Use   Smoking status: Every Day    Current packs/day: 1.00    Average packs/day: 1 pack/day for 46.5 years (46.5 ttl pk-yrs)    Types: Cigarettes    Start date: 05/19/1977   Smokeless tobacco: Never  Vaping Use   Vaping status: Former  Substance and Sexual Activity   Alcohol use: No    Alcohol/week: 0.0 standard drinks of alcohol   Drug use: No   Sexual activity: Not Currently    Birth control/protection: None  Other Topics Concern   Not on file  Social History Narrative   Living with her daughter now    Social Drivers of Health   Financial Resource  Strain: Patient Declined (06/18/2023)   Received from [facility]   Overall Financial Resource Strain (CARDIA)    Difficulty of Paying Living Expenses: Patient declined  Food Insecurity: Patient Unable To Answer (10/31/2023)   Hunger Vital Sign    Worried About Running Out of Food in the Last Year: Patient unable to answer    Ran Out of Food in the Last Year: Patient unable to answer  Transportation Needs: Patient Unable To Answer (10/31/2023)   PRAPARE - Transportation    Lack of Transportation (Medical): Patient unable to answer    Lack of Transportation (Non-Medical): Patient unable to answer  Physical Activity: Sufficiently Active (03/07/2023)  Exercise Vital Sign    Days of Exercise per Week: 7 days    Minutes of Exercise per Session: 30 min  Stress: No Stress Concern Present (03/07/2023)   [employer] of Occupational Health - Occupational Stress Questionnaire    Feeling of Stress : Only a little  Social Connections: Socially Isolated (08/29/2023)   Social Connection and Isolation Panel    Frequency of Communication with Friends and Family: More than three times a week    Frequency of Social Gatherings with Friends and Family: Never    Attends Religious Services: Never    Database administrator or Organizations: No    Attends Banker Meetings: Never    Marital Status: Divorced  Catering manager Violence: Patient Unable To Answer (10/31/2023)   Humiliation, Afraid, Rape, and Kick questionnaire    Fear of Current or Ex-Partner: Patient unable to answer    Emotionally Abused: Patient unable to answer    Physically Abused: Patient unable to answer    Sexually Abused: Patient unable to answer    Family History  Problem Relation Age of Onset   Emphysema Mother    Anxiety disorder Mother    Stroke Father    Throat cancer Father    Lung cancer Maternal Grandmother    Lung cancer Maternal Grandfather    Hypertension Daughter    Diabetes Daughter     Multiple sclerosis Daughter    Bipolar disorder Daughter    Cervical cancer Daughter    Bipolar disorder Daughter    Drug abuse Daughter    Lung cancer Maternal Aunt    Lung cancer Maternal Uncle    Allergies  Allergen Reactions   Augmentin  [Amoxicillin -Pot Clavulanate] Diarrhea   Penicillins Itching   I? Current Facility-Administered Medications  Medication Dose Route Frequency Provider Last Rate Last Admin   acetaminophen  (TYLENOL ) suppository 650 mg  650 mg Rectal Q4H PRN Rust-Chester, Britton L, NP       acetaminophen  (TYLENOL ) tablet 650 mg  650 mg Per Tube Q6H PRN Keene, Jeremiah D, NP   650 mg at 11/12/23 0238   albuterol  (PROVENTIL ) (2.5 MG/3ML) 0.083% nebulizer solution 2.5 mg  2.5 mg Nebulization Q4H PRN Franchot Novel, MD       budesonide  (PULMICORT ) nebulizer solution 0.5 mg  0.5 mg Nebulization BID Keene, Jeremiah D, NP   0.5 mg at 11/15/23 9281   carbidopa -levodopa  (SINEMET  CR) 50-200 MG per tablet controlled release 1 tablet  1 tablet Oral QHS Nada Adriana BIRCH, RPH   1 tablet at 11/14/23 2117   carbidopa -levodopa  (SINEMET  IR) 10-100 MG per tablet immediate release 1 tablet  1 tablet Oral TID WC Grubb, Rodney D, RPH   1 tablet at 11/15/23 0827   cefTRIAXone  (ROCEPHIN ) 2 g in sodium chloride  0.9 % 100 mL IVPB  2 g Intravenous Q24H Josette Ade, MD 200 mL/hr at 11/15/23 0829 2 g at 11/15/23 0829   Chlorhexidine  Gluconate Cloth 2 % PADS 6 each  6 each Topical Daily Lenon Marien CROME, MD   6 each at 11/15/23 0835   chlorpheniramine-HYDROcodone (TUSSIONEX) 10-8 MG/5ML suspension 5 mL  5 mL Oral QHS PRN Franchot Novel, MD       dextrose  50 % solution 50 mL  1 ampule Intravenous PRN Aleskerov, Fuad, MD   50 mL at 11/02/23 9277   doxycycline  (VIBRA -TABS) tablet 100 mg  100 mg Oral Q12H Franchot Novel, MD   100 mg at 11/15/23 0827   DULoxetine  (CYMBALTA ) DR capsule 60 mg  60 mg Oral Daily Grubb, Rodney D, RPH   60 mg at 11/15/23 0827   enoxaparin  (LOVENOX ) injection  40 mg  40 mg Subcutaneous Q24H Patel, Kishan S, RPH   40 mg at 11/15/23 0825   fluticasone  furoate-vilanterol (BREO ELLIPTA ) 200-25 MCG/ACT 1 puff  1 puff Inhalation Daily Franchot Novel, MD   1 puff at 11/15/23 9173   gabapentin  (NEURONTIN ) capsule 600 mg  600 mg Oral BID Franchot Novel, MD   600 mg at 11/15/23 9173   And   gabapentin  (NEURONTIN ) capsule 600 mg  600 mg Oral QHS Franchot Novel, MD   600 mg at 11/14/23 2109   ipratropium-albuterol  (DUONEB) 0.5-2.5 (3) MG/3ML nebulizer solution 3 mL  3 mL Inhalation Q6H PRN Franchot Novel, MD   3 mL at 11/11/23 9287   leptospermum manuka honey (MEDIHONEY) paste 1 Application  1 Application Topical Daily Aleskerov, Fuad, MD   1 Application at 11/15/23 0836   lidocaine  (LIDODERM ) 5 % 1 patch  1 patch Transdermal Q24H Franchot Novel, MD   1 patch at 11/14/23 2107   lipase/protease/amylase (CREON ) capsule 12,000 Units  12,000 Units Oral TID THEOPOLIS Lenon Marien LITTIE, MD   12,000 Units at 11/15/23 0827   morphine  (PF) 2 MG/ML injection 2 mg  2 mg Intravenous Q2H PRN Josette Ade, MD   2 mg at 11/15/23 0439   multivitamin with minerals tablet 1 tablet  1 tablet Oral Daily Lenon Marien LITTIE, MD   1 tablet at 11/15/23 0827   ondansetron  (ZOFRAN ) injection 4 mg  4 mg Intravenous Q6H PRN Mansy, Jan A, MD   4 mg at 11/05/23 1148   Oral care mouth rinse  15 mL Mouth Rinse PRN Aleskerov, Fuad, MD       oxyCODONE  (Oxy IR/ROXICODONE ) immediate release tablet 5 mg  5 mg Oral Q6H PRN Nelson, Dana G, NP   5 mg at 11/15/23 0827   pantoprazole  (PROTONIX ) EC tablet 40 mg  40 mg Oral Daily Franchot Novel, MD   40 mg at 11/15/23 0827   potassium chloride  SA (KLOR-CON  M) CR tablet 20 mEq  20 mEq Oral Q3H Lenon Elsie HERO, RPH   20 mEq at 11/15/23 0827   QUEtiapine  (SEROQUEL ) tablet 25 mg  25 mg Oral QHS Ouma, Callista Achieng, NP   25 mg at 11/14/23 2109   theophylline  (UNIPHYL) 400 MG 24 hr tablet 400 mg  400 mg Oral Daily Lenon Marien LITTIE, MD    400 mg at 11/15/23 0841   torsemide  (DEMADEX ) tablet 20 mg  20 mg Oral Daily Franchot Novel, MD   20 mg at 11/15/23 0827   umeclidinium bromide  (INCRUSE ELLIPTA ) 62.5 MCG/ACT 1 puff  1 puff Inhalation Daily Perley Lum DEL, RPH   1 puff at 11/15/23 9161   Vitamin D  (Ergocalciferol ) (DRISDOL ) 1.25 MG (50000 UNIT) capsule 50,000 Units  50,000 Units Oral Q7 days Franchot Novel, MD   50,000 Units at 11/14/23 1738     Abtx:  Anti-infectives (From admission, onward)    Start     Dose/Rate Route Frequency Ordered Stop   11/14/23 1242  vancomycin  (VANCOCIN ) powder  Status:  Discontinued          As needed 11/14/23 1247 11/14/23 1342   11/13/23 0930  cefTRIAXone  (ROCEPHIN ) 2 g in sodium chloride  0.9 % 100 mL IVPB        2 g 200 mL/hr over 30 Minutes Intravenous Every 24 hours 11/13/23 0838     11/12/23 2200  doxycycline  (VIBRA -TABS) tablet 100 mg        100 mg Oral Every 12 hours 11/12/23 1913 11/17/23 2159   11/11/23 2200  amoxicillin -clavulanate (AUGMENTIN ) 875-125 MG per tablet 1 tablet  Status:  Discontinued        1 tablet Oral Every 12 hours 11/11/23 1817 11/13/23 0838   11/02/23 1400  cefTRIAXone  (ROCEPHIN ) 2 g in sodium chloride  0.9 % 100 mL IVPB  Status:  Discontinued        2 g 200 mL/hr over 30 Minutes Intravenous Every 24 hours 11/02/23 1058 11/07/23 1022   11/01/23 2200  meropenem  (MERREM ) 500 mg in sodium chloride  0.9 % 100 mL IVPB  Status:  Discontinued        500 mg 200 mL/hr over 30 Minutes Intravenous Every 12 hours 11/01/23 1043 11/02/23 1058   11/01/23 1030  meropenem  (MERREM ) 1 g in sodium chloride  0.9 % 100 mL IVPB  Status:  Discontinued        1 g 200 mL/hr over 30 Minutes Intravenous Every 12 hours 11/01/23 1013 11/01/23 1043   10/31/23 2100  ceFEPIme  (MAXIPIME ) 2 g in sodium chloride  0.9 % 100 mL IVPB  Status:  Discontinued        2 g 200 mL/hr over 30 Minutes Intravenous Every 24 hours 10/30/23 2140 11/01/23 1013   10/30/23 2218  vancomycin  variable dose per  unstable renal function (pharmacist dosing)  Status:  Discontinued         Does not apply See admin instructions 10/30/23 2218 10/31/23 0909   10/30/23 2145  vancomycin  (VANCOREADY) IVPB 750 mg/150 mL        750 mg 150 mL/hr over 60 Minutes Intravenous  Once 10/30/23 2136 10/31/23 0101   10/30/23 2130  vancomycin  (VANCOCIN ) IVPB 1000 mg/200 mL premix  Status:  Discontinued        1,000 mg 200 mL/hr over 60 Minutes Intravenous  Once 10/30/23 2127 10/30/23 2133   10/30/23 2130  ceFEPIme  (MAXIPIME ) 2 g in sodium chloride  0.9 % 100 mL IVPB  Status:  Discontinued        2 g 200 mL/hr over 30 Minutes Intravenous  Once 10/30/23 2127 10/30/23 2140   10/30/23 2030  ceFEPIme  (MAXIPIME ) 2 g in sodium chloride  0.9 % 100 mL IVPB        2 g 200 mL/hr over 30 Minutes Intravenous  Once 10/30/23 2023 10/30/23 2205   10/30/23 2015  metroNIDAZOLE  (FLAGYL ) IVPB 500 mg        500 mg 100 mL/hr over 60 Minutes Intravenous  Once 10/30/23 2000 10/30/23 2330   10/30/23 2000  cefTRIAXone  (ROCEPHIN ) 2 g in sodium chloride  0.9 % 100 mL IVPB  Status:  Discontinued        2 g 200 mL/hr over 30 Minutes Intravenous Once 10/30/23 1953 10/30/23 2000   10/30/23 2000  vancomycin  (VANCOCIN ) IVPB 1000 mg/200 mL premix        1,000 mg 200 mL/hr over 60 Minutes Intravenous  Once 10/30/23 1953 10/30/23 2103       REVIEW OF SYSTEMS:  Const: negative fever, negative chills, negative weight loss Eyes: negative diplopia or visual changes, negative eye pain ENT: negative coryza, negative sore throat Resp cough and shortness of breath  cards: negative for chest pain, palpitations, edema legs GU: Difficulty in passing urine, urinary retention  GI: Negative for abdominal pain, diarrhea, bleeding, constipation Skin: Multiple tears in the legs Heme: negative for easy bruising and gum/nose bleeding FD:[id number] Neurolo: Frequent  falls, Parkinson's disease Psych:y, depression: Daughter died last year from OD Endocrine: negative  for thyroid , diabetes Allergy/Immunology- Penicillin itching Augmentin  diarrhea  Objective:  VITALS:  BP (!) 140/79 (BP Location: Left Arm)   Pulse 95   Temp 98.6 F (37 C)   Resp 17   Ht 5' 8 (1.727 m)   Wt 63.6 kg   SpO2 94%   BMI 21.32 kg/m   PHYSICAL EXAM:  General: Alert, cooperative, no distress,thin,   Head: Normocephalic, without obvious abnormality, atraumatic. Eyes: Conjunctivae clear, anicteric sclerae. Pupils are equal ENT Nares normal. No drainage or sinus tenderness. Lips, mucosa, and tongue normal. No Thrush Neck: Supple, symmetrical, no adenopathy, thyroid : non tender no carotid bruit and no JVD. Back: No CVA tenderness. Lungs: b/l air entry Heart: Regular rate and rhythm, no murmur, rub or gallop. Abdomen: Soft, non-tender,not distended. Bowel sounds normal. No masses Extremities: today          On admission 10/30/23                      Skin: multiple wound Lymph: Cervical, supraclavicular normal. Neurologic: Grossly non-focal Pertinent Labs Lab Results CBC    Component Value Date/Time   WBC 6.0 11/14/2023 0256   RBC 2.32 (L) 11/14/2023 0256   HGB 7.5 (L) 11/14/2023 0256   HGB 13.7 11/16/2013 2040   HCT 23.0 (L) 11/14/2023 0256   HCT 40.9 11/16/2013 2040   PLT 414 (H) 11/14/2023 0256   PLT 254 11/16/2013 2040   MCV 99.1 11/14/2023 0256   MCV 94 11/16/2013 2040   MCH 32.3 11/14/2023 0256   MCHC 32.6 11/14/2023 0256   RDW 16.4 (H) 11/14/2023 0256   RDW 14.0 11/16/2013 2040   LYMPHSABS 0.4 (L) 11/02/2023 1215   LYMPHSABS 2.7 11/16/2013 2040   MONOABS 0.2 11/02/2023 1215   MONOABS 0.6 11/16/2013 2040   EOSABS 0.0 11/02/2023 1215   EOSABS 0.1 11/16/2013 2040   BASOSABS 0.0 11/02/2023 1215   BASOSABS 0.1 11/16/2013 2040   BASOSABS 0 11/15/2011 1430       Latest Ref Rng & Units 11/15/2023    5:01 AM 11/14/2023    2:56 AM 11/13/2023    5:30 AM  CMP  Glucose 70 - 99 mg/dL 78  883  88   BUN 8 - 23 mg/dL 14  14   14    Creatinine 0.44 - 1.00 mg/dL 8.89  8.88  9.05   Sodium 135 - 145 mmol/L 136  137  134   Potassium 3.5 - 5.1 mmol/L 3.6  3.4  3.4   Chloride 98 - 111 mmol/L 103  104  104   CO2 22 - 32 mmol/L 24  25  24    Calcium  8.9 - 10.3 mg/dL 7.5  7.3  7.2   Total Protein 6.5 - 8.1 g/dL   4.8   Total Bilirubin 0.0 - 1.2 mg/dL   0.4   Alkaline Phos 38 - 126 U/L   88   AST 15 - 41 U/L   25   ALT 0 - 44 U/L   5       Microbiology: Recent Results (from the past 240 hours)  Body fluid culture w Gram Stain     Status: None (Preliminary result)   Collection Time: 11/12/23 11:10 AM   Specimen: Lung, Right Lower Lobe; Pleural Fluid  Result Value Ref Range Status   Specimen Description   Final    PLEURAL Performed at [facility], [address] [place], [place] [address]    Special Requests   Final    R LL Performed at [facility], [address] [place], [place] [address]    Gram Stain NO WBC SEEN NO ORGANISMS SEEN   Final   Culture   Final    NO GROWTH 3 DAYS Performed at [facility], [address], [place], [place] [address]    Report Status PENDING  Incomplete  Aerobic/Anaerobic Culture w Gram Stain (surgical/deep wound)     Status: None (Preliminary result)   Collection Time: 11/12/23  1:31 PM   Specimen: Wound  Result Value Ref Range Status   Specimen Description   Final    WOUND Performed at [facility], [address] [place], [place] [address]    Special Requests   Final    RIGHT FOOT Performed at [facility], [address] [place], [place] [address]    Gram Stain   Final    NO WBC SEEN NO ORGANISMS SEEN Performed at [facility], [address] [place], [place] [address]    Culture   Final    RARE SERRATIA MARCESCENS SUSCEPTIBILITIES TO FOLLOW CULTURE REINCUBATED FOR BETTER GROWTH NO ANAEROBES ISOLATED; CULTURE IN PROGRESS FOR 5 DAYS    Report Status PENDING  Incomplete  Aerobic/Anaerobic Culture w  Gram Stain (surgical/deep wound)     Status: None (Preliminary result)   Collection Time: 11/13/23  8:20 AM   Specimen: Wound; Abscess  Result Value Ref Range Status   Specimen Description   Final    WOUND Performed at [facility], [address] [place], [place] [address]    Special Requests   Final    RF Performed at [facility], [address] [place], [place] [address]    Gram Stain   Final    NO WBC SEEN NO ORGANISMS SEEN Performed at [facility], [address], [place], [place] [address]    Culture RARE SERRATIA MARCESCENS  Final   Report Status PENDING  Incomplete  Aerobic/Anaerobic Culture w Gram Stain (surgical/deep wound)     Status: None (Preliminary result)   Collection Time: 11/14/23 12:31 PM   Specimen: Foot, Right; Tissue  Result Value Ref Range Status   Specimen Description   Final    TISSUE Performed at [facility], [address] [place], [place] [address]    Special Requests   Final    RIGHT FOOT ABSCESS Performed at [facility], [address] [place], [place] [address]    Gram Stain NO WBC SEEN NO ORGANISMS SEEN   Final   Culture   Final    NO GROWTH < 12 HOURS Performed at [facility], [address], [place], [place] [address]    Report Status PENDING  Incomplete  Aerobic/Anaerobic Culture w Gram Stain (surgical/deep wound)     Status: None (Preliminary result)   Collection Time: 11/14/23  1:02 PM   Specimen: Bone; Tissue  Result Value Ref Range Status   Specimen Description   Final    BONE Performed at [facility], [address] [place], [place] [address]    Special Requests   Final    SECOND TOE LEFT FOOT BONE CULTURE Performed at [facility], [address] [place], [place] [address]    Gram Stain NO WBC SEEN NO ORGANISMS SEEN   Final   Culture   Final  NO GROWTH < 12 HOURS Performed at [facility], [address], [place], [place] [address]     Report Status PENDING  Incomplete      IMAGING RESULTS: 9/21/2-5 CTA of chest showed multifocal pneumonia possibly on the basis of aspiration Moderate right pleural effusion Layering gallstones Mild to moderate bilateral hydronephrosis Which is chronic and improved Moderate upper abdominal ascites progressive Mild hepatic steatosis I have personally reviewed the films ? 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  AKI improved Patient has bilateral hydronephrosis which is chronic Patient has had urinary retention in the past She had a Foley catheter placed for 5 days and has been removed Watch for the creatinine as she may need to go back on the Foley if it increases Will do a postvoid bladder scan to look for residual  History of multiple falls  Chronic pain syndrome on opioids at home  Moderate right pleural effusion  COPD Multifocal infiltrates suggestive of aspiration Patient has been on antibiotics  Ascites Hypoalbuminemia AST was increased before Recommend CT abdomen to look at the abnormality seen on CT chest  Anemia  Gallstones  This consult involved complex antimicrobial management   ________________________________________________ Discussed with patient, requesting provider and ID pharmacist Note:  This document was prepared using Dragon voice recognition software and may include unintentional dictation errors.

## 2023-11-15 NOTE — Progress Notes (Signed)
 PODIATRY / FOOT AND ANKLE SURGERY PROGRESS NOTE   HPI: Tricia [name] is a 63 y.o. female who presents for f/u s/p 1d L 2nd toe amp, right foot I&D and wound debridements both legs.  She has pain present to all sites but this is unchanged compared to preop.  She has not put weight on the right foot and PT was at bedside today with postop shoes about to begin PT/OT training.  She currently denies n/v/f/c.  PMHx:  Past Medical History:  Diagnosis Date   Anxiety    Arthritis    joints and hands/ knees   Asthma    uses inhaler   Benign essential tremor    head   Cervical dystonia    neck pain   Cholesteatoma of left ear    x2   COPD (chronic obstructive pulmonary disease) (HCC)    Cough    Depression    Diabetes mellitus without complication (HCC)    type 2   Diastolic dysfunction    Dyspnea    Dysrhythmia    diastolic dysfunction   GERD (gastroesophageal reflux disease)    Headache    migraines/ one per week   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   Hyperlipidemia    Hypertension    Motion sickness    boat   Neuromuscular disorder (HCC)    neuropathy feet and hands( nerve damage)   Wears dentures    upper and lower    Surgical Hx:  Past Surgical History:  Procedure Laterality Date   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   Procedure: AMPUTATION, TOE;  Surgeon: Lennie Barter, DPM;  Location: [facility] ORS;  Service: Orthopedics/Podiatry;  Laterality: Left;  2nd Toe amputation   CARPAL TUNNEL RELEASE Bilateral    x2 right, 1x on left   COLONOSCOPY     COLONOSCOPY WITH PROPOFOL  N/A 04/25/2017   Procedure: COLONOSCOPY WITH PROPOFOL ;  Surgeon: Jinny Carmine, MD;  Location: [facility];  Service: Endoscopy;  Laterality: N/A;  diabetic-oral med   DILATION AND CURETTAGE OF UTERUS     ESOPHAGOGASTRODUODENOSCOPY (EGD) WITH PROPOFOL  N/A 01/10/2021   Procedure: ESOPHAGOGASTRODUODENOSCOPY (EGD) WITH PROPOFOL ;  Surgeon: Janalyn Keene NOVAK, MD;  Location: [facility]  SURGERY [facility];  Service: Endoscopy;  Laterality: N/A;  Diabetic   EXTERNAL EAR SURGERY Left    x2   IRRIGATION AND DEBRIDEMENT FOOT Right 11/14/2023   Procedure: IRRIGATION AND DEBRIDEMENT FOOT;  Surgeon: Lennie Barter, DPM;  Location: [facility] ORS;  Service: Orthopedics/Podiatry;  Laterality: Right;   POLYPECTOMY  04/25/2017   Procedure: POLYPECTOMY INTESTINAL;  Surgeon: Jinny Carmine, MD;  Location: [facility];  Service: Endoscopy;;   SPINE SURGERY     herniated disc   TUBAL LIGATION     WOUND DEBRIDEMENT Bilateral 11/14/2023   Procedure: DEBRIDEMENT, WOUND;  Surgeon: Lennie Barter, DPM;  Location: [facility] ORS;  Service: Orthopedics/Podiatry;  Laterality: Bilateral;    FHx:  Family History  Problem Relation Age of Onset   Emphysema Mother    Anxiety disorder Mother    Stroke Father    Throat cancer Father    Lung cancer Maternal Grandmother    Lung cancer Maternal Grandfather    Hypertension Daughter    Diabetes Daughter    Multiple sclerosis Daughter    Bipolar disorder Daughter    Cervical cancer Daughter    Bipolar disorder Daughter    Drug abuse Daughter    Lung cancer Maternal Aunt    Lung cancer Maternal  Uncle     Social History:  reports that she has been smoking cigarettes. She started smoking about 46 years ago. She has a 46.5 pack-year smoking history. She has never used smokeless tobacco. She reports that she does not drink alcohol and does not use drugs.  Allergies:  Allergies  Allergen Reactions   Augmentin  [Amoxicillin -Pot Clavulanate] Diarrhea   Penicillins Itching   Medications Prior to Admission  Medication Sig Dispense Refill   ascorbic acid  (VITAMIN C ) 500 MG tablet Take 1 tablet (500 mg total) by mouth 2 (two) times daily.     aspirin  EC 81 MG tablet Take 81 mg by mouth daily. Swallow whole.     calcium  carbonate (TUMS - DOSED IN MG ELEMENTAL CALCIUM ) 500 MG chewable tablet Chew 2 tablets (400 mg of elemental calcium  total) by mouth 3 (three) times  daily as needed for indigestion or heartburn.     carbidopa -levodopa  (SINEMET  CR) 50-200 MG tablet Take 1 tablet by mouth at bedtime.     carbidopa -levodopa  (SINEMET  IR) 25-100 MG tablet Take 1.5 tablets by mouth 3 (three) times daily.     colchicine  0.6 MG tablet Take 1 tablet (0.6 mg total) by mouth 2 (two) times daily as needed for up to 3 days (gout flare).     cyclobenzaprine  (FLEXERIL ) 10 MG tablet Take 10 mg by mouth at bedtime.     diphenoxylate -atropine  (LOMOTIL ) 2.5-0.025 MG tablet Take 2 tablets by mouth 4 (four) times daily as needed for diarrhea or loose stools. 30 tablet 0   DULoxetine  (CYMBALTA ) 60 MG capsule Take 1 capsule (60 mg total) by mouth daily. 90 capsule 1   empagliflozin  (JARDIANCE ) 25 MG TABS tablet Take 1 tablet (25 mg total) by mouth daily before breakfast. 90 tablet 1   furosemide  (LASIX ) 40 MG tablet Take 1 tablet (40 mg total) by mouth daily as needed. 30 tablet 2   leptospermum manuka honey (MEDIHONEY) PSTE paste Apply 1 Application topically daily. Apply to sacral wound Apply thin layer (3 mm) to wound.     levocetirizine (XYZAL ) 5 MG tablet Take 1 tablet (5 mg total) by mouth every evening. 90 tablet 1   midodrine  (PROAMATINE ) 5 MG tablet Take 1 tablet (5 mg total) by mouth 3 (three) times daily with meals.     montelukast  (SINGULAIR ) 10 MG tablet Take 1 tablet by mouth at bedtime.     Multiple Vitamin (MULTIVITAMIN WITH MINERALS) TABS tablet Take 1 tablet by mouth daily.     omeprazole  (PRILOSEC) 40 MG capsule Take 1 capsule (40 mg total) by mouth daily. 90 capsule 0   oxyCODONE  (OXY IR/ROXICODONE ) 5 MG immediate release tablet Take 1 tablet (5 mg total) by mouth every 4 (four) hours as needed for moderate pain (pain score 4-6) or severe pain (pain score 7-10). 30 tablet 0   QUEtiapine  (SEROQUEL ) 25 MG tablet Take 1 tablet (25 mg total) by mouth at bedtime. 90 tablet 1   rosuvastatin  (CRESTOR ) 5 MG tablet Take 1 tablet (5 mg total) by mouth at bedtime. 90 tablet 1    SYMBICORT  160-4.5 MCG/ACT inhaler INHALE 2 PUFFS TWICE A DAY RINSE MOUTH WITH WATER  AFTER EACH USE 10.2 g 2   theophylline  (UNIPHYL) 400 MG 24 hr tablet Take 1 tablet by mouth daily.      tiotropium (SPIRIVA ) 18 MCG inhalation capsule Place 1 capsule into inhaler and inhale daily. pm     VENTOLIN  HFA 108 (90 Base) MCG/ACT inhaler INHALE 1 PUFF BY MOUTH AS NEEDED  18 g 0   zinc  sulfate, 50mg  elemental zinc , 220 (50 Zn) MG capsule Take 1 capsule (220 mg total) by mouth daily.     ipratropium-albuterol  (DUONEB) 0.5-2.5 (3) MG/3ML SOLN Inhale 3 mLs into the lungs every 6 (six) hours as needed. 360 mL 3    Physical Exam: General: Alert and oriented.  No apparent distress.  Vascular: DP/PT pulses +1 bil, CFT <3s, no hair growth present to BLE, mild edema to BLE  Neuro:Light touch sensation reduced to BLE.  Derm:Right lower leg wounds x 2, L lateral lower leg wound appear healthy with granular bases, mild serousanginous drainage, no erythema, no SOI  Incision sites to the right plantar midfoot and L 2nd toe amp site appear well coapted with sutures intact, mild maceration to the right plantar midfoot incision site, mild edema, minimal erythema, no active drainage.          MSK: L 2nd toe amputation   Results for orders placed or performed during the hospital encounter of 10/30/23 (from the past 48 hours)  Glucose, capillary     Status: Abnormal   Collection Time: 11/13/23  5:21 PM  Result Value Ref Range   Glucose-Capillary 150 (H) 70 - 99 mg/dL    Comment: Glucose reference range applies only to samples taken after fasting for at least 8 hours.  Glucose, capillary     Status: Abnormal   Collection Time: 11/13/23  8:15 PM  Result Value Ref Range   Glucose-Capillary 163 (H) 70 - 99 mg/dL    Comment: Glucose reference range applies only to samples taken after fasting for at least 8 hours.   Comment 1 Notify RN   Basic metabolic panel with GFR     Status: Abnormal   Collection Time:  11/14/23  2:56 AM  Result Value Ref Range   Sodium 137 135 - 145 mmol/L   Potassium 3.4 (L) 3.5 - 5.1 mmol/L   Chloride 104 98 - 111 mmol/L   CO2 25 22 - 32 mmol/L   Glucose, Bld 116 (H) 70 - 99 mg/dL    Comment: Glucose reference range applies only to samples taken after fasting for at least 8 hours.   BUN 14 8 - 23 mg/dL   Creatinine, Ser 8.88 (H) 0.44 - 1.00 mg/dL   Calcium  7.3 (L) 8.9 - 10.3 mg/dL   GFR, Estimated 56 (L) >60 mL/min    Comment: (NOTE) Calculated using the CKD-EPI Creatinine Equation (2021)    Anion gap 8 5 - 15    Comment: Performed at [facility], [address] [place], [place] [address]  Magnesium      Status: Abnormal   Collection Time: 11/14/23  2:56 AM  Result Value Ref Range   Magnesium  1.6 (L) 1.7 - 2.4 mg/dL    Comment: Performed at [facility], [address] [place], [place] [address]  Type and screen [facility]     Status: None (Preliminary result)   Collection Time: 11/14/23  2:56 AM  Result Value Ref Range   ABO/RH(D) A POS    Antibody Screen POS    Sample Expiration 11/17/2023,2359    Antibody Identification ANTI M    DAT, IgG NEG    DAT, complement NEG    Unit Number [id number]    Blood Component Type RBC LR PHER1    Unit division 00    Status of Unit ALLOCATED    Transfusion Status OK TO TRANSFUSE    Crossmatch Result COMPATIBLE  Unit Number [id number]    Blood Component Type RBC LR PHER2    Unit division 00    Status of Unit ALLOCATED    Transfusion Status OK TO TRANSFUSE    Crossmatch Result COMPATIBLE   CBC     Status: Abnormal   Collection Time: 11/14/23  2:56 AM  Result Value Ref Range   WBC 6.0 4.0 - 10.5 K/uL   RBC 2.32 (L) 3.87 - 5.11 MIL/uL   Hemoglobin 7.5 (L) 12.0 - 15.0 g/dL   HCT 76.9 (L) 63.9 - 53.9 %   MCV 99.1 80.0 - 100.0 fL   MCH 32.3 26.0 - 34.0 pg   MCHC 32.6 30.0 - 36.0 g/dL   RDW 83.5 (H) 88.4 - 84.4 %   Platelets 414 (H) 150 - 400 K/uL   nRBC 0.0 0.0  - 0.2 %    Comment: Performed at [facility], [address] [place], [place] [address]  Glucose, capillary     Status: None   Collection Time: 11/14/23  7:33 AM  Result Value Ref Range   Glucose-Capillary 77 70 - 99 mg/dL    Comment: Glucose reference range applies only to samples taken after fasting for at least 8 hours.  Glucose, capillary     Status: None   Collection Time: 11/14/23 11:13 AM  Result Value Ref Range   Glucose-Capillary 76 70 - 99 mg/dL    Comment: Glucose reference range applies only to samples taken after fasting for at least 8 hours.  Aerobic/Anaerobic Culture w Gram Stain (surgical/deep wound)     Status: None (Preliminary result)   Collection Time: 11/14/23 12:31 PM   Specimen: Foot, Right; Tissue  Result Value Ref Range   Specimen Description      TISSUE Performed at [facility], [address], [place], [place] [address]    Special Requests      RIGHT FOOT ABSCESS Performed at [facility], [address] [place], [place] [address]    Gram Stain NO WBC SEEN NO ORGANISMS SEEN     Culture      NO GROWTH < 12 HOURS Performed at [facility], [address], [place], [place] [address]    Report Status PENDING   Aerobic/Anaerobic Culture w Gram Stain (surgical/deep wound)     Status: None (Preliminary result)   Collection Time: 11/14/23  1:02 PM   Specimen: Bone; Tissue  Result Value Ref Range   Specimen Description      BONE Performed at [facility], [address], [place], [place] [address]    Special Requests      SECOND TOE LEFT FOOT BONE CULTURE Performed at [facility], [address] [place], [place] [address]    Gram Stain NO WBC SEEN NO ORGANISMS SEEN     Culture      NO GROWTH < 12 HOURS Performed at [facility], [address] [place], [place] [address]    Report Status PENDING   Glucose, capillary     Status: None   Collection Time: 11/14/23  2:19 PM  Result Value  Ref Range   Glucose-Capillary 82 70 - 99 mg/dL    Comment: Glucose reference range applies only to samples taken after fasting for at least 8 hours.  Glucose, capillary     Status: Abnormal   Collection Time: 11/14/23  4:35 PM  Result Value Ref Range   Glucose-Capillary 142 (H) 70 - 99 mg/dL  Comment: Glucose reference range applies only to samples taken after fasting for at least 8 hours.  Basic metabolic panel with GFR     Status: Abnormal   Collection Time: 11/15/23  5:01 AM  Result Value Ref Range   Sodium 136 135 - 145 mmol/L   Potassium 3.6 3.5 - 5.1 mmol/L   Chloride 103 98 - 111 mmol/L   CO2 24 22 - 32 mmol/L   Glucose, Bld 78 70 - 99 mg/dL    Comment: Glucose reference range applies only to samples taken after fasting for at least 8 hours.   BUN 14 8 - 23 mg/dL   Creatinine, Ser 8.89 (H) 0.44 - 1.00 mg/dL   Calcium  7.5 (L) 8.9 - 10.3 mg/dL   GFR, Estimated 56 (L) >60 mL/min    Comment: (NOTE) Calculated using the CKD-EPI Creatinine Equation (2021)    Anion gap 9 5 - 15    Comment: Performed at [facility], [address] [place], [place] [address]  Magnesium      Status: None   Collection Time: 11/15/23  5:01 AM  Result Value Ref Range   Magnesium  1.9 1.7 - 2.4 mg/dL    Comment: Performed at [facility], [address], [place], [place] [address]  Phosphorus     Status: None   Collection Time: 11/15/23  5:01 AM  Result Value Ref Range   Phosphorus 3.0 2.5 - 4.6 mg/dL    Comment: Performed at [facility], [address] [place], [place] [address]  Glucose, capillary     Status: None   Collection Time: 11/15/23  8:56 AM  Result Value Ref Range   Glucose-Capillary 84 70 - 99 mg/dL    Comment: Glucose reference range applies only to samples taken after fasting for at least 8 hours.  Glucose, capillary     Status: Abnormal   Collection Time: 11/15/23 12:09 PM  Result Value Ref Range   Glucose-Capillary 132 (H) 70 - 99 mg/dL     Comment: Glucose reference range applies only to samples taken after fasting for at least 8 hours.   DG MINI C-ARM IMAGE ONLY Result Date: 11/14/2023 There is no interpretation for this exam.  This order is for images obtained during a surgical procedure.  Please See Surgeries Tab for more information regarding the procedure.   DG Chest Port 1 View Result Date: 11/13/2023 CLINICAL DATA:  Right-sided chest pain EXAM: PORTABLE CHEST 1 VIEW COMPARISON:  11/12/2023 FINDINGS: Shallow inspiration. Heart size and pulmonary vascularity are normal. Increasing infiltration or atelectasis in the right lung base with small right pleural effusion. This may represent compressive atelectasis or pneumonia. Suggestion of developing patchy infiltration in the left medial lung base. Emphysematous changes and fibrosis in the lungs. No pneumothorax. Mediastinal contours appear intact. Degenerative changes in the shoulders. IMPRESSION: Small right pleural effusion. Increasing infiltration or atelectasis in the right base and developing infiltration in the medial left base. Electronically Signed   By: Elsie Gravely M.D.   On: 11/13/2023 21:29   MR FOOT LEFT WO CONTRAST Result Date: 11/13/2023 CLINICAL DATA:  Diabetic foot swelling.  Foot pain. EXAM: MRI OF THE LEFT FOOT WITHOUT CONTRAST TECHNIQUE: Multiplanar, multisequence MR imaging of the left foot from the midfoot through the toes was performed. No intravenous contrast was administered. COMPARISON:  Radiographs 11/13/2023 and CT scan 10/31/2023 FINDINGS: Despite efforts by the technologist and patient, motion artifact is present on today's exam and could not be eliminated. This reduces exam  sensitivity and specificity. Bones/Joint/Cartilage Hallux valgus. Bony erosion medially along the first metatarsal head, probably related to gout arthropathy. Degenerative findings along the articulation between the first digit sesamoids in the first metatarsal head. Subtle edema in the  head of the second metatarsal on image 17 series 9, given the subtlety I favor reactive edema with the overlying wound over early osteomyelitis. No overt bony destructive findings. Ligaments Lisfranc ligament intact. Muscles and Tendons Regional muscular atrophy. Lobularity along the plantar fascia raising possibility of mild plantar fibromatosis. Soft tissues Plantar subcutaneous edema along the forefoot. Ulceration/wound dorsally along the second toe about the proximal interphalangeal joint. IMPRESSION: 1. Ulceration/wound dorsally along the second toe about the proximal interphalangeal joint. Subtle edema in the head of the second metatarsal, given the subtlety I favor reactive edema with the overlying wound over early osteomyelitis. 2. Hallux valgus with bony erosion medially along the first metatarsal head, probably related to gout arthropathy. 3. Degenerative findings along the articulation between the first digit sesamoids and the first metatarsal head. 4. Regional muscular atrophy. 5. Lobularity along the plantar fascia raising possibility of mild plantar fibromatosis. Electronically Signed   By: Ryan Salvage M.D.   On: 11/13/2023 16:01    Blood pressure (!) 140/79, pulse 95, temperature 98.6 F (37 C), resp. rate 17, height 5' 8 (1.727 m), weight 63.6 kg, SpO2 94%.  Assessment S/p L 2nd toe amp due to DFU/osteomyelitis S/p R foot incision and drainage S/p wound debridements both legs  Plan -Patient seen and examined -R midfoot wound culture growing serriatia, same bacteria cultured from blood.  Appreciate ID recs for Abx. -Clinically wounds and incision sites appear stable. -Applied xeroform to all wounds 4x4, abd, kerlix, ace wrap.  Rec dressing changes every other day.  Orders placed -PWB heel contact R in postop shoe, L WBAT in postop shoe.  Podiatry to sign off at this time.  Patient may f/u in outpatient clinic in 1-2 weeks.  Prentice Lee 11/15/2023, 1:42 PM

## 2023-11-15 NOTE — Plan of Care (Signed)
   Problem: Fluid Volume: Goal: Hemodynamic stability will improve Outcome: Progressing   Problem: Clinical Measurements: Goal: Diagnostic test results will improve Outcome: Progressing Goal: Signs and symptoms of infection will decrease Outcome: Progressing   Problem: Respiratory: Goal: Ability to maintain adequate ventilation will improve Outcome: Progressing

## 2023-11-15 NOTE — TOC Progression Note (Signed)
 Transition of Care [facility]) - Progression Note    Patient Details  Name: Tricia [name] MRN: [id number] Date of Birth: Jul 31, 1960  [facility] of [facility] CM/SW Contact  Anastazia Creek  [place], [place] Phone Number: 11/15/2023, 3:25 PM  Clinical Narrative:   Patient insurance auth approved, Can admit to [facility] over the weekend when medically ready. [facility] Admin coordinator is aware.   Approved: PlanAuthID:[id number] Dates:99/26-9/30/2025 Next Review Date: 11/19/2023   TOC to follow for discharge                     Expected Discharge Plan and Services                                               Social Drivers of Health (SDOH) Interventions SDOH Screenings   Food Insecurity: Patient Unable To Answer (10/31/2023)  Housing: Unknown (10/31/2023)  Transportation Needs: Patient Unable To Answer (10/31/2023)  Utilities: Patient Unable To Answer (10/31/2023)  Alcohol Screen: Low Risk  (03/07/2023)  Depression (PHQ2-9): High Risk (05/01/2023)  Financial Resource Strain: Patient Declined (06/18/2023)   Received from [facility]  Physical Activity: Sufficiently Active (03/07/2023)  Social Connections: Socially Isolated (08/29/2023)  Stress: No Stress Concern Present (03/07/2023)  Tobacco Use: High Risk (11/14/2023)  Health Literacy: Adequate Health Literacy (03/07/2023)    Readmission Risk Interventions    11/02/2023    1:21 PM  Readmission Risk Prevention Plan  Transportation Screening Complete  Medication Review (RN Care Manager) Complete  PCP or Specialist appointment within 3-5 days of discharge Complete  SW Recovery Care/Counseling Consult Complete  Palliative Care Screening Not Applicable  Skilled Nursing Facility Not Applicable

## 2023-11-16 DIAGNOSIS — A488 Other specified bacterial diseases: Secondary | ICD-10-CM

## 2023-11-16 DIAGNOSIS — R7881 Bacteremia: Secondary | ICD-10-CM

## 2023-11-16 DIAGNOSIS — L97522 Non-pressure chronic ulcer of other part of left foot with fat layer exposed: Secondary | ICD-10-CM | POA: Diagnosis not present

## 2023-11-16 DIAGNOSIS — A419 Sepsis, unspecified organism: Secondary | ICD-10-CM | POA: Diagnosis not present

## 2023-11-16 DIAGNOSIS — L0390 Cellulitis, unspecified: Secondary | ICD-10-CM | POA: Diagnosis not present

## 2023-11-16 DIAGNOSIS — L02611 Cutaneous abscess of right foot: Secondary | ICD-10-CM | POA: Diagnosis not present

## 2023-11-16 LAB — GLUCOSE, CAPILLARY
Glucose-Capillary: 91 mg/dL (ref 70–99)
Glucose-Capillary: 242 mg/dL — ABNORMAL HIGH (ref 70–99)
Glucose-Capillary: 133 mg/dL — ABNORMAL HIGH (ref 70–99)

## 2023-11-16 LAB — BASIC METABOLIC PANEL WITH GFR
Anion gap: 6 (ref 5–15)
BUN: 15 mg/dL (ref 8–23)
CO2: 25 mmol/L (ref 22–32)
Calcium: 7.7 mg/dL — ABNORMAL LOW (ref 8.9–10.3)
Chloride: 104 mmol/L (ref 98–111)
Creatinine, Ser: 1.09 mg/dL — ABNORMAL HIGH (ref 0.44–1.00)
GFR, Estimated: 57 mL/min — ABNORMAL LOW (ref >60)
Glucose, Bld: 97 mg/dL (ref 70–99)
Potassium: 3.8 mmol/L (ref 3.5–5.1)
Sodium: 135 mmol/L (ref 135–145)

## 2023-11-16 LAB — MAGNESIUM: Magnesium: 2.1 mg/dL (ref 1.7–2.4)

## 2023-11-16 LAB — HEMOGLOBIN: Hemoglobin: 7.9 g/dL — ABNORMAL LOW (ref 12.0–15.0)

## 2023-11-16 MED ORDER — MORPHINE SULFATE (PF) 2 MG/ML IV SOLN
2.0000 mg | INTRAVENOUS | Status: DC | PRN
  Administered 2023-11-16: 2 mg via INTRAVENOUS
  Filled 2023-11-16 (×2): qty 1

## 2023-11-16 MED ORDER — OXYCODONE HCL 5 MG PO TABS
5.0000 mg | ORAL_TABLET | Freq: Four times a day (QID) | ORAL | Status: DC | PRN
Start: 2023-11-16 — End: 2023-11-17
  Administered 2023-11-16 – 2023-11-17 (×4): 5 mg via ORAL
  Filled 2023-11-16 (×4): qty 1

## 2023-11-16 MED ORDER — POTASSIUM CHLORIDE CRYS ER 20 MEQ PO TBCR
20.0000 meq | EXTENDED_RELEASE_TABLET | Freq: Once | ORAL | Status: AC
  Administered 2023-11-16: 20 meq via ORAL
  Filled 2023-11-16: qty 1

## 2023-11-16 NOTE — Plan of Care (Signed)
   Problem: Fluid Volume: Goal: Hemodynamic stability will improve Outcome: Progressing   Problem: Clinical Measurements: Goal: Diagnostic test results will improve Outcome: Progressing Goal: Signs and symptoms of infection will decrease Outcome: Progressing

## 2023-11-16 NOTE — Plan of Care (Signed)

## 2023-11-16 NOTE — Progress Notes (Signed)
 Date of Admission:  10/30/2023      ID: Tricia [name] is a 63 y.o. female Principal Problem:   Sepsis due to cellulitis [facility]) Active Problems:   Chronic diastolic CHF (congestive heart failure) (HCC)   Dyslipidemia   Gout   Parkinson's disease (HCC)   GERD without esophagitis   Protein-calorie malnutrition, severe   Pressure injury   Asthma, chronic   AKI (acute kidney injury)   Hyperkalemia   Hyponatremia   Leg swelling   Toe ulcer (HCC)   Drop in hemoglobin   Pleural effusion on right   Foot abscess, right   Hypomagnesemia    Subjective: Doing fine No complaints  Medications:   budesonide  (PULMICORT ) nebulizer solution  0.5 mg Nebulization BID   carbidopa -levodopa   1 tablet Oral QHS   carbidopa -levodopa   1 tablet Oral TID WC   Chlorhexidine  Gluconate Cloth  6 each Topical Daily   DULoxetine   60 mg Oral Daily   enoxaparin  (LOVENOX ) injection  40 mg Subcutaneous Q24H   fluticasone  furoate-vilanterol  1 puff Inhalation Daily   gabapentin   600 mg Oral BID   And   gabapentin   600 mg Oral QHS   leptospermum manuka honey  1 Application Topical Daily   lidocaine   1 patch Transdermal Q24H   lipase/protease/amylase  12,000 Units Oral TID AC   multivitamin with minerals  1 tablet Oral Daily   pantoprazole   40 mg Oral Daily   QUEtiapine   25 mg Oral QHS   theophylline   400 mg Oral Daily   torsemide   20 mg Oral Daily   umeclidinium bromide   1 puff Inhalation Daily   Vitamin D  (Ergocalciferol )  50,000 Units Oral Q7 days    Objective: Vital signs in last 24 hours: Patient Vitals for the past 24 hrs:  BP Temp Temp src Pulse Resp SpO2 Weight  11/16/23 1606 114/66 98.7 F (37.1 C) -- 95 17 99 % --  11/16/23 0710 129/68 97.7 F (36.5 C) -- 84 17 96 % --  11/16/23 0500 -- -- -- -- -- -- 61.9 kg  11/16/23 0426 131/68 98 F (36.7 C) Oral 84 16 93 % --  11/15/23 2039 -- -- -- -- -- 98 % --  11/15/23 1947 110/66 98.3 F (36.8 C) -- 89 17 98 % --     Lines  and Device Date on insertion # of days DC  Engineer, technical sales     ETT       PHYSICAL EXAM:  General: Alert, cooperative, no distress, appears stated age.  Lungs: Clear to auscultation bilaterally. No Wheezing or Rhonchi. No rales. Heart: Regular rate and rhythm, no murmur, rub or gallop. Abdomen: Soft, non-tender,not distended. Bowel sounds normal. No masses Extremities: dressing b/l lower extremities Skin: b/l leg skin ulcers  Rt foot         Lymph: Cervical, supraclavicular normal. Neurologic: Grossly non-focal  Lab Results    Latest Ref Rng & Units 11/16/2023    3:51 AM 11/14/2023    2:56 AM 11/13/2023    5:30 AM  CBC  WBC 4.0 - 10.5 K/uL  6.0  7.6   Hemoglobin 12.0 - 15.0 g/dL 7.9  7.5  7.5   Hematocrit 36.0 - 46.0 %  23.0  23.0   Platelets 150 - 400 K/uL  414  445        Latest Ref Rng & Units 11/16/2023    3:51 AM 11/15/2023    5:01 AM  11/14/2023    2:56 AM  CMP  Glucose 70 - 99 mg/dL 97  78  883   BUN 8 - 23 mg/dL 15  14  14    Creatinine 0.44 - 1.00 mg/dL 8.90  8.89  8.88   Sodium 135 - 145 mmol/L 135  136  137   Potassium 3.5 - 5.1 mmol/L 3.8  3.6  3.4   Chloride 98 - 111 mmol/L 104  103  104   CO2 22 - 32 mmol/L 25  24  25    Calcium  8.9 - 10.3 mg/dL 7.7  7.5  7.3       Microbiology: Rt foot abscess culture/gram stain  gram neg rod- ID pending    Assessment/Plan: Serratia bacteremia Treated with IV antibiotics for 1 week Serratia abscess in the right foot status post I&D Multiple wounds on the legs Patient is going to need minimum of 2 weeks of antibiotics She is currently on ceftriaxone  Await susceptibility of the wound culture see whether ciprofloxacin  is susceptible Need to check for any QTc prolongation or aneurysms or contraindications for Cipro  or any drug drug interactions Left 2nd toe infection with osteo- s/p amputation   AKI improved Patient has bilateral hydronephrosis which is chronic Patient has had urinary  retention in the past She had a Foley catheter placed for 5 days and has been removed Watch for the creatinine as she may need to go back on the Foley if it increases Check postvoid bladder scan to look for residual   History of multiple falls   Chronic pain syndrome on opioids at home   Moderate right pleural effusion   COPD Multifocal infiltrates suggestive of aspiration Patient has been on antibiotics   Ascites Hypoalbuminemia AST was increased before Recommend CT abdomen to look at the abnormality seen on CT chest   Anemia   Gallstones   Discussed the management with the patient

## 2023-11-16 NOTE — Consult Note (Signed)
 PHARMACY CONSULT NOTE - ELECTROLYTES  Pharmacy Consult for Electrolyte Monitoring and Replacement   Recent Labs: Height: 5' 8 (172.7 cm) Weight: 61.9 kg (136 lb 7.4 oz) IBW/kg (Calculated) : 63.9 Estimated Creatinine Clearance: 51.6 mL/min (A) (by C-G formula based on SCr of 1.09 mg/dL (H)). Potassium (mmol/L)  Date Value  11/16/2023 3.8  11/16/2013 4.0   Magnesium  (mg/dL)  Date Value  90/72/7974 2.1   Calcium  (mg/dL)  Date Value  90/72/7974 7.7 (L)   Calcium , Total (mg/dL)  Date Value  90/71/7984 7.9 (L)   Albumin  (g/dL)  Date Value  90/75/7974 1.7 (L)  07/06/2015 4.2  10/07/2012 2.9 (L)   Phosphorus (mg/dL)  Date Value  90/73/7974 3.0   Sodium (mmol/L)  Date Value  11/16/2023 135  07/06/2015 146 (H)  11/16/2013 131 (L)   Assessment  Tricia [name] is a 63 y.o. female presenting with weakness and leg swelling. PMH significant for anxiety, osteoarthritis, asthma, COPD, depression, type 2 diabetes mellitus, GERD, dyslipidemia, and hypertension. Pharmacy has been consulted to monitor and replace electrolytes.  Diet: regular MIVF: N/A Pertinent medications: torsemide  20mg  PO daily  Goal of Therapy: Electrolytes WNL  Plan: K 3.8   Will order potassium chloride  20 mEq PO  x1 (on torsemide ) Check electrolytes with tomorrow AM labs  Thank you for involving pharmacy in this patient's care.   Allean Haas PharmD Clinical Pharmacist 11/16/2023 '

## 2023-11-16 NOTE — Progress Notes (Signed)
 Progress Note   [name] FMW:[id number] DOB: 05/01/1960 DOA: 10/30/2023     17 DOS: the patient was seen and examined on 11/16/2023   Brief hospital course: 63 y.o. female with a PMH significant for anxiety, osteoarthritis, asthma, COPD, depression, type 2 diabetes mellitus, GERD, dyslipidemia, and HTN, who presented to the emergency room with acute onset of generalized weakness and bilateral lower extremity swelling after being found down after several hours.   ED Course: BP was 109/53 with heart rate of 114 and later BP was 96/52 and temperature was 99.8, respiratory rate was 22. Labs reveal hyponatremia 128 and hyperkalemia of 6 with a CO2 of 13 and glucose of 126, BUN of 42 and creatinine 3.64 calcium  of 7.8 and albumin  2 with total protein 5.7.  BNP was 78.5 and high-sensitivity troponin I was 14.  Lactic acid was 2.6 and CBC showed hemoglobin 11.8 and hematocrit 36.9.  Respiratory panel came back negative.  Blood cultures were drawn. EKG: sinus tachycardia with rate 118 with poor R wave progression. Imaging: Portable chest x-ray showed emphysema with no acute cardiopulmonary disease. The patient was given IV vancomycin  and  IV cefepime .  On initial evaluation- patient was lethargic and minimally responsive. BP low and not responding to IV fluids or increase in midodrine . CCM was consulted who took over care for patient who required vasopressors. Weaned from them 9/14. NG tube was placed for nutrition as she was unable to tolerate PO while incapacitated.  9/10.  Blood cultures positive for Serratia. 9/15: transferred back to [facility]   11/12/23 -patient BP stable, will wean midodrine . PO intake is low but consistent, will dc NG tube and add PO supplements.  Respiratory status- improved back to baseline O2 use. 9/24.  I was able to squeeze out some pus from her right foot.  MRI did not show any evidence of osteomyelitis and no organized fluid collection identified.  Case  discussed with podiatry and he would like to take to the operating room for washout procedure.  MRI of the left foot ordered because he does not like the way a toe on the left foot looks.  Stopped Augmentin  and started Rocephin  9/25.  Dr. Lennie podiatry took to the operating room for right foot incision and drainage of right midfoot abscess, right foot wound debridement, left foot wound debridement and left second toe amputation 9/26.  Serratia growing out of wound culture.  Case discussed with infectious disease specialist and can continue Rocephin  while here and then likely switch over to Cipro  for around another week upon discharge. 9/27.  Patient still had a lot of pain this morning was on IV morphine .  Spoke with nursing staff and patient about using oral medications and only using IV if absolutely necessary.  Unable to do IV medications at the rehab.  Assessment and Plan: * Sepsis due to cellulitis [facility]) Present on admission.  Cellulitis and abscess foot.  Blood cultures on 9/10 grew out Serratia.  Wound culture on 9/23 and 9/24 growing Serratia also.  MRI does not show osteomyelitis or an abscess.  Podiatry brought to the operating room 9/25 for incision and drainage of abscess right foot.  Patient on Rocephin .  ID consultation recommended continuing Rocephin  while here and switching over to Cipro  for total of 14 days.  Heel contact (partial weightbearing) with right foot and postop shoe.  Toe ulcer (HCC) Second toe left foot suspected osteomyelitis.  Dr. Lennie took to the operating room on 9/25 second toe amputation  on left foot.  Can weight-bear as tolerated with postop shoe  Drop in hemoglobin Last hemoglobin down to 7.9.   No signs of bleeding.  Did have a thoracentesis.  Ferritin elevated going along with anemia of chronic disease.  Pleural effusion on right Interventional radiology drew off 800 mL of fluid on 9/23.  AKI (acute kidney injury) Creatinine 3.64 on presentation and  currently 1.09  GERD without esophagitis Continue PPI therapy.  Parkinson's disease (HCC) - Will continue Sinemet  CR and Sinemet  IR.  Dyslipidemia Will continue statin therapy.  Chronic diastolic CHF (congestive heart failure) (HCC) On low-dose torsemide   Hyponatremia Last sodium normal range  Hyperkalemia Resolved after acute kidney injury improved.  Now with hypokalemia.  Asthma, chronic Will continue theophylline , Singulair , and bronchodilator inhaler.  Hypomagnesemia Replaced  Pressure injury Wound 09/08/23 1300 Pressure Injury Sacrum Medial Stage 3 -  Full thickness tissue loss. Subcutaneous fat may be visible but bone, tendon or muscle are NOT exposed. (Active)     Wound 09/22/23 2320 Pressure Injury Buttocks Left Stage 1 -  Intact skin with non-blanchable redness of a localized area usually over a bony prominence. (Active)     Wound 09/22/23 2320 Pressure Injury Buttocks Right Stage 2 -  Partial thickness loss of dermis presenting as a shallow open injury with a red, pink wound bed without slough. (Active)     Wound 10/31/23 1900 Pressure Injury Buttocks Bilateral Deep Tissue Pressure Injury - Purple or maroon localized area of discolored intact skin or blood-filled blister due to damage of underlying soft tissue from pressure and/or shear. (Active)      Protein-calorie malnutrition, severe Continue supplements  Gout Continue colchicine .     Subjective: Patient received IV pain medication this morning.  Need to convert off IV pain medication in order to go out to a rehab.  Patient still has some pain in her foot.  Initially admitted with sepsis and then developed abscess on her right foot.  Physical Exam: Vitals:   11/15/23 2039 11/16/23 0426 11/16/23 0500 11/16/23 0710  BP:  131/68  129/68  Pulse:  84  84  Resp:  16  17  Temp:  98 F (36.7 C)  97.7 F (36.5 C)  TempSrc:  Oral    SpO2: 98% 93%  96%  Weight:   61.9 kg   Height:       Physical  Exam HENT:     Head: Normocephalic.  Eyes:     General: Lids are normal.     Conjunctiva/sclera: Conjunctivae normal.  Cardiovascular:     Rate and Rhythm: Normal rate and regular rhythm.     Heart sounds: Normal heart sounds, S1 normal and S2 normal.  Pulmonary:     Breath sounds: No decreased breath sounds, wheezing, rhonchi or rales.  Abdominal:     Palpations: Abdomen is soft.     Tenderness: There is no abdominal tenderness.  Musculoskeletal:     Right lower leg: Swelling present.     Left lower leg: Swelling present.  Skin:    General: Skin is warm.     Comments: Bilateral legs wrapped  Neurological:     Mental Status: She is alert and oriented to person, place, and time.     Data Reviewed: Creatinine 1.09, hemoglobin 7.9 Family Communication: Spoke with Ozell on the phone  Disposition: Status is: Inpatient Remains inpatient appropriate because: Still need to convert off IV pain medication over to oral prior to getting out to rehab  Planned  Discharge Destination: Rehab    Time spent: 28 minutes  Author: Charlie Patterson, MD 11/16/2023 3:57 PM  For on call review [email or web address].

## 2023-11-17 DIAGNOSIS — L0390 Cellulitis, unspecified: Secondary | ICD-10-CM | POA: Diagnosis not present

## 2023-11-17 DIAGNOSIS — A488 Other specified bacterial diseases: Secondary | ICD-10-CM

## 2023-11-17 DIAGNOSIS — L97522 Non-pressure chronic ulcer of other part of left foot with fat layer exposed: Secondary | ICD-10-CM | POA: Diagnosis not present

## 2023-11-17 DIAGNOSIS — A419 Sepsis, unspecified organism: Secondary | ICD-10-CM | POA: Diagnosis not present

## 2023-11-17 DIAGNOSIS — L02611 Cutaneous abscess of right foot: Secondary | ICD-10-CM | POA: Diagnosis not present

## 2023-11-17 LAB — BASIC METABOLIC PANEL WITH GFR
Anion gap: 13 (ref 5–15)
BUN: 15 mg/dL (ref 8–23)
CO2: 25 mmol/L (ref 22–32)
Calcium: 7.9 mg/dL — ABNORMAL LOW (ref 8.9–10.3)
Chloride: 101 mmol/L (ref 98–111)
Creatinine, Ser: 1.19 mg/dL — ABNORMAL HIGH (ref 0.44–1.00)
GFR, Estimated: 51 mL/min — ABNORMAL LOW (ref >60)
Glucose, Bld: 121 mg/dL — ABNORMAL HIGH (ref 70–99)
Potassium: 3.8 mmol/L (ref 3.5–5.1)
Sodium: 139 mmol/L (ref 135–145)

## 2023-11-17 LAB — GLUCOSE, CAPILLARY
Glucose-Capillary: 90 mg/dL (ref 70–99)
Glucose-Capillary: 161 mg/dL — ABNORMAL HIGH (ref 70–99)

## 2023-11-17 MED ORDER — LIDOCAINE 5 % EX PTCH: 1.0000 | MEDICATED_PATCH | CUTANEOUS | 0 refills | Status: AC

## 2023-11-17 MED ORDER — ENOXAPARIN SODIUM 40 MG/0.4ML IJ SOSY: 40.0000 mg | PREFILLED_SYRINGE | INTRAMUSCULAR | 0 refills | Status: DC

## 2023-11-17 MED ORDER — ACETAMINOPHEN 650 MG RE SUPP: 650.0000 mg | RECTAL | Status: AC | PRN

## 2023-11-17 MED ORDER — POTASSIUM CHLORIDE CRYS ER 20 MEQ PO TBCR
20.0000 meq | EXTENDED_RELEASE_TABLET | Freq: Once | ORAL | Status: AC
  Administered 2023-11-17: 20 meq via ORAL
  Filled 2023-11-17: qty 1

## 2023-11-17 MED ORDER — CARBIDOPA-LEVODOPA 10-100 MG PO TABS: 1.0000 | ORAL_TABLET | Freq: Three times a day (TID) | ORAL | 0 refills | Status: DC

## 2023-11-17 MED ORDER — THEOPHYLLINE ER 200 MG PO CP24: 200.0000 mg | ORAL_CAPSULE | Freq: Every day | ORAL | 0 refills | Status: DC

## 2023-11-17 MED ORDER — TORSEMIDE 20 MG PO TABS: 20.0000 mg | ORAL_TABLET | Freq: Every day | ORAL | 0 refills | Status: DC

## 2023-11-17 MED ORDER — CARBIDOPA-LEVODOPA ER 50-200 MG PO TBCR: 1.0000 | EXTENDED_RELEASE_TABLET | Freq: Every day | ORAL | 0 refills | Status: DC

## 2023-11-17 MED ORDER — OXYCODONE HCL 5 MG PO TABS: 5.0000 mg | ORAL_TABLET | Freq: Four times a day (QID) | ORAL | 0 refills | Status: DC | PRN

## 2023-11-17 MED ORDER — GABAPENTIN 300 MG PO CAPS: ORAL_CAPSULE | ORAL | 0 refills | Status: DC

## 2023-11-17 MED ORDER — PANCRELIPASE (LIP-PROT-AMYL) 12000-38000 UNITS PO CPEP: 12000.0000 [IU] | ORAL_CAPSULE | Freq: Three times a day (TID) | ORAL | 0 refills | Status: AC

## 2023-11-17 MED ORDER — CIPROFLOXACIN HCL 500 MG PO TABS: 500.0000 mg | ORAL_TABLET | Freq: Two times a day (BID) | ORAL | 0 refills | Status: AC

## 2023-11-17 MED ORDER — VITAMIN D (ERGOCALCIFEROL) 1.25 MG (50000 UNIT) PO CAPS: 50000.0000 [IU] | ORAL_CAPSULE | ORAL | 0 refills | Status: AC

## 2023-11-17 NOTE — Consult Note (Signed)
 PHARMACY CONSULT NOTE - ELECTROLYTES  Pharmacy Consult for Electrolyte Monitoring and Replacement   Recent Labs: Height: 5' 8 (172.7 cm) Weight: 59.7 kg (131 lb 9.8 oz) IBW/kg (Calculated) : 63.9 Estimated Creatinine Clearance: 45.6 mL/min (A) (by C-G formula based on SCr of 1.19 mg/dL (H)). Potassium (mmol/L)  Date Value  11/17/2023 3.8  11/16/2013 4.0   Magnesium  (mg/dL)  Date Value  90/72/7974 2.1   Calcium  (mg/dL)  Date Value  90/71/7974 7.9 (L)   Calcium , Total (mg/dL)  Date Value  90/71/7984 7.9 (L)   Albumin  (g/dL)  Date Value  90/75/7974 1.7 (L)  07/06/2015 4.2  10/07/2012 2.9 (L)   Phosphorus (mg/dL)  Date Value  90/73/7974 3.0   Sodium (mmol/L)  Date Value  11/17/2023 139  07/06/2015 146 (H)  11/16/2013 131 (L)   Assessment  Tricia [name] is a 63 y.o. female presenting with weakness and leg swelling. PMH significant for anxiety, osteoarthritis, asthma, COPD, depression, type 2 diabetes mellitus, GERD, dyslipidemia, and hypertension. Pharmacy has been consulted to monitor and replace electrolytes.  Diet: regular MIVF: N/A Pertinent medications: torsemide  20mg  PO daily  Goal of Therapy: Electrolytes WNL  Plan: K 3.8   Will order potassium chloride  20 mEq PO  x1 (on torsemide ) Check electrolytes with tomorrow AM labs  Thank you for involving pharmacy in this patient's care.   Allean Haas PharmD Clinical Pharmacist 11/17/2023 '

## 2023-11-17 NOTE — NC FL2 (Signed)
 [place]  MEDICAID FL2 LEVEL OF CARE FORM     IDENTIFICATION  Patient Name: Tricia [name] Birthdate: [birth year] Sex: female Admission Date (Current Location): 10/30/2023  [facility] and [place] Number:  Chiropodist and Address:  [facility], [address] [place], [place] [address]      Provider Number: [id number]  Attending Physician Name and Address:  Josette Ade, MD  Relative Name and Phone Number:       Current Level of Care: Hospital Recommended Level of Care: Skilled Nursing Facility Prior Approval Number:    Date Approved/Denied:   PASRR Number: [id number]  Discharge Plan: SNF    Current Diagnoses: Patient Active Problem List   Diagnosis Date Noted   Serratia marcescens infection 11/16/2023   Gram-negative bacteremia 11/16/2023   Foot abscess, right 11/14/2023   Hypomagnesemia 11/14/2023   Toe ulcer (HCC) 11/13/2023   Drop in hemoglobin 11/13/2023   Pleural effusion on right 11/13/2023   Leg swelling 11/01/2023   Sepsis due to cellulitis (HCC) 10/30/2023   Asthma, chronic 10/30/2023   AKI (acute kidney injury) 10/30/2023   Hyperkalemia 10/30/2023   Hyponatremia 10/30/2023   Physical deconditioning 09/23/2023   Acute metabolic encephalopathy 09/23/2023   Pressure injury 09/23/2023   Chronic respiratory failure with hypoxia (HCC) 09/22/2023   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/22/2023   SIRS (systemic inflammatory response syndrome) (HCC) 09/22/2023   Chronic prescription opiate use 09/22/2023   Frequent falls 09/22/2023   Protein-calorie malnutrition, severe 09/17/2023   Malnutrition of moderate degree 09/02/2023   Syncope, vasovagal 08/29/2023   Parkinson disease (HCC) 08/29/2023   Anxiety and depression 08/29/2023   Chronic obstructive pulmonary disease (COPD) (HCC) 08/29/2023   GERD without esophagitis 08/29/2023   Elevated CK 08/29/2023   Type 2 diabetes mellitus with stage  3 chronic kidney disease (HCC) 08/29/2023   Acute kidney injury superimposed on chronic kidney disease 08/28/2023   Sacral wound 02/21/2022   Pressure injury of sacral region, stage 1 02/21/2022   CAP (community acquired pneumonia) 09/08/2021   Nausea 09/08/2021   Hypoalbuminemia due to protein-calorie malnutrition 09/08/2021   Lumbar herniated disc 07/11/2021   Calculus of gallbladder without cholecystitis without obstruction 07/11/2021   Senile purpura 06/05/2021   Atherosclerosis of aorta 06/05/2021   Parkinson's disease (HCC) 06/05/2021   Chronic kidney disease (CKD) stage G3a/A2, moderately decreased glomerular filtration rate (GFR) between 45-59 mL/min/1.73 square meter and albuminuria creatinine ratio between 30-299 mg/g (HCC) 06/05/2021   Esophageal dysphagia    Gastric erythema    Columnar-lined esophagus    Moderate malnutrition 09/26/2020   Centrilobular emphysema (HCC) 03/30/2020   History of urinary retention 11/18/2019   GAD (generalized anxiety disorder) 10/13/2019   MDD (major depressive disorder), recurrent episode, moderate (HCC) 10/13/2019   At risk for long QT syndrome 10/13/2019   Nonrheumatic mitral valve regurgitation 05/04/2019   Benign neoplasm of descending colon    Polyp of sigmoid colon    History of Klebsiella UTI 09/11/2023 01/24/2015   Polyneuropathy 10/21/2014   Chronic venous insufficiency 10/05/2014   Bilateral leg edema 08/16/2014   Major depression in partial remission 08/16/2014   Acid reflux 08/16/2014   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   Asthma, moderate persistent 08/16/2014   Carpal tunnel syndrome 08/16/2014   Cervical pain 08/16/2014   CAFL (chronic airflow limitation) (HCC) 08/16/2014   Type 2 diabetes mellitus with peripheral neuropathy (HCC) 08/16/2014   Diabetes mellitus type 2, insulin  dependent (HCC) 08/16/2014   Dyslipidemia 08/16/2014  Tobacco use disorder 08/16/2014   Benign neoplasm of stomach 08/16/2014   Gout  08/16/2014   Mixed hyperlipidemia 08/16/2014   Low back pain 08/16/2014   Lumbar radiculopathy 08/16/2014   Headache, migraine 08/16/2014   Arthralgia of multiple joints 08/16/2014   Vitamin D  deficiency 08/16/2014   Primary osteoarthritis of both knees 06/22/2014   Benign essential tremor 10/02/2013   Cervical dystonia 10/02/2013   Chronic diastolic CHF (congestive heart failure) (HCC) 11/16/2012   Chronic pain 11/13/2012    Orientation RESPIRATION BLADDER Height & Weight     Self, Time, Situation, Place  Normal Continent Weight: 59.7 kg Height:  5' 8 (172.7 cm)  BEHAVIORAL SYMPTOMS/MOOD NEUROLOGICAL BOWEL NUTRITION STATUS      Incontinent Diet  AMBULATORY STATUS COMMUNICATION OF NEEDS Skin   Extensive Assist Verbally Surgical wounds (Pressure injury Buttocks, Sacrum Medial)                       Personal Care Assistance Level of Assistance  Bathing, Dressing, Feeding Bathing Assistance: Limited assistance Feeding assistance: Limited assistance Dressing Assistance: Limited assistance     Functional Limitations Info  Sight, Hearing, Speech Sight Info: Impaired Hearing Info: Adequate Speech Info: Adequate    SPECIAL CARE FACTORS FREQUENCY  PT (By licensed PT), OT (By licensed OT)     PT Frequency: 5x/week OT Frequency: 5x/week            Contractures      Additional Factors Info  Code Status, Allergies Code Status Info: Full Allergies Info: Augmentin  (Amoxicillin -pot Clavulanate), Heparin , Penicillins           Current Medications (11/17/2023):  This is the current hospital active medication list Current Facility-Administered Medications  Medication Dose Route Frequency Provider Last Rate Last Admin   acetaminophen  (TYLENOL ) suppository 650 mg  650 mg Rectal Q4H PRN Rust-Chester, Britton L, NP       acetaminophen  (TYLENOL ) tablet 650 mg  650 mg Oral Q6H PRN Dail Rankin RAMAN, RPH   650 mg at 11/16/23 1609   albuterol  (PROVENTIL ) (2.5 MG/3ML) 0.083%  nebulizer solution 2.5 mg  2.5 mg Nebulization Q4H PRN Franchot Novel, MD       budesonide  (PULMICORT ) nebulizer solution 0.5 mg  0.5 mg Nebulization BID Keene, Jeremiah D, NP   0.5 mg at 11/17/23 0747   carbidopa -levodopa  (SINEMET  CR) 50-200 MG per tablet controlled release 1 tablet  1 tablet Oral QHS Nada Adriana BIRCH, RPH   1 tablet at 11/16/23 2126   carbidopa -levodopa  (SINEMET  IR) 10-100 MG per tablet immediate release 1 tablet  1 tablet Oral TID WC Grubb, Rodney D, RPH   1 tablet at 11/17/23 9085   cefTRIAXone  (ROCEPHIN ) 2 g in sodium chloride  0.9 % 100 mL IVPB  2 g Intravenous Q24H Josette Ade, MD 200 mL/hr at 11/17/23 0921 2 g at 11/17/23 9078   Chlorhexidine  Gluconate Cloth 2 % PADS 6 each  6 each Topical Daily Lenon Marien CROME, MD   6 each at 11/17/23 0916   chlorpheniramine-HYDROcodone (TUSSIONEX) 10-8 MG/5ML suspension 5 mL  5 mL Oral QHS PRN Franchot Novel, MD       dextrose  50 % solution 50 mL  1 ampule Intravenous PRN Aleskerov, Fuad, MD   50 mL at 11/02/23 9277   DULoxetine  (CYMBALTA ) DR capsule 60 mg  60 mg Oral Daily Grubb, Rodney D, RPH   60 mg at 11/16/23 0908   enoxaparin  (LOVENOX ) injection 40 mg  40 mg Subcutaneous Q24H Patel, Kishan  S, RPH   40 mg at 11/17/23 0913   fluticasone  furoate-vilanterol (BREO ELLIPTA ) 200-25 MCG/ACT 1 puff  1 puff Inhalation Daily Franchot Novel, MD   1 puff at 11/17/23 9083   gabapentin  (NEURONTIN ) capsule 600 mg  600 mg Oral BID Franchot Novel, MD   600 mg at 11/17/23 9085   And   gabapentin  (NEURONTIN ) capsule 600 mg  600 mg Oral QHS Franchot Novel, MD   600 mg at 11/16/23 2125   ipratropium-albuterol  (DUONEB) 0.5-2.5 (3) MG/3ML nebulizer solution 3 mL  3 mL Inhalation Q6H PRN Franchot Novel, MD   3 mL at 11/11/23 9287   leptospermum manuka honey (MEDIHONEY) paste 1 Application  1 Application Topical Daily Aleskerov, Fuad, MD   1 Application at 11/17/23 0915   lidocaine  (LIDODERM ) 5 % 1 patch  1 patch Transdermal Q24H  Franchot Novel, MD   1 patch at 11/16/23 2127   lipase/protease/amylase (CREON ) capsule 12,000 Units  12,000 Units Oral TID THEOPOLIS Lenon Marien LITTIE, MD   12,000 Units at 11/17/23 0914   morphine  (PF) 2 MG/ML injection 2 mg  2 mg Intravenous Q2H PRN Josette Ade, MD   2 mg at 11/16/23 1525   multivitamin with minerals tablet 1 tablet  1 tablet Oral Daily Lenon Marien LITTIE, MD   1 tablet at 11/17/23 0914   ondansetron  (ZOFRAN ) injection 4 mg  4 mg Intravenous Q6H PRN Mansy, Jan A, MD   4 mg at 11/05/23 1148   Oral care mouth rinse  15 mL Mouth Rinse PRN Aleskerov, Fuad, MD       oxyCODONE  (Oxy IR/ROXICODONE ) immediate release tablet 5 mg  5 mg Oral Q6H PRN Josette Ade, MD   5 mg at 11/17/23 0914   pantoprazole  (PROTONIX ) EC tablet 40 mg  40 mg Oral Daily Franchot Novel, MD   40 mg at 11/17/23 9085   potassium chloride  SA (KLOR-CON  M) CR tablet 20 mEq  20 mEq Oral Once Merrill, Kristin A, RPH       QUEtiapine  (SEROQUEL ) tablet 25 mg  25 mg Oral QHS Ouma, Trent Achieng, NP   25 mg at 11/16/23 2125   theophylline  (UNIPHYL) 400 MG 24 hr tablet 400 mg  400 mg Oral Daily Lenon Marien LITTIE, MD   400 mg at 11/17/23 9081   torsemide  (DEMADEX ) tablet 20 mg  20 mg Oral Daily Franchot Novel, MD   20 mg at 11/17/23 0914   umeclidinium bromide  (INCRUSE ELLIPTA ) 62.5 MCG/ACT 1 puff  1 puff Inhalation Daily Perley Lum DEL, RPH   1 puff at 11/17/23 0916   Vitamin D  (Ergocalciferol ) (DRISDOL ) 1.25 MG (50000 UNIT) capsule 50,000 Units  50,000 Units Oral Q7 days Franchot Novel, MD   50,000 Units at 11/14/23 1738     Discharge Medications: Please see discharge summary for a list of discharge medications.  Relevant Imaging Results:  Relevant Lab Results:   Additional Information SSN: [id number]  Marinda Cooks, RN

## 2023-11-17 NOTE — Plan of Care (Signed)
 Problem: Fluid Volume: Goal: Hemodynamic stability will improve 11/17/2023 1212 by Teressa Nest, RN Outcome: Adequate for Discharge 11/17/2023 1211 by Teressa Nest, RN Outcome: Adequate for Discharge   Problem: Clinical Measurements: Goal: Diagnostic test results will improve 11/17/2023 1212 by Teressa Nest, RN Outcome: Adequate for Discharge 11/17/2023 1211 by Teressa Nest, RN Outcome: Adequate for Discharge Goal: Signs and symptoms of infection will decrease 11/17/2023 1212 by Teressa Nest, RN Outcome: Adequate for Discharge 11/17/2023 1211 by Teressa Nest, RN Outcome: Adequate for Discharge   Problem: Respiratory: Goal: Ability to maintain adequate ventilation will improve 11/17/2023 1212 by Teressa Nest, RN Outcome: Adequate for Discharge 11/17/2023 1211 by Teressa Nest, RN Outcome: Adequate for Discharge   Problem: Education: Goal: Ability to describe self-care measures that may prevent or decrease complications (Diabetes Survival Skills Education) will improve 11/17/2023 1212 by Teressa Nest, RN Outcome: Adequate for Discharge 11/17/2023 1211 by Teressa Nest, RN Outcome: Adequate for Discharge Goal: Individualized Educational Video(s) 11/17/2023 1212 by Teressa Nest, RN Outcome: Adequate for Discharge 11/17/2023 1211 by Teressa Nest, RN Outcome: Adequate for Discharge   Problem: Coping: Goal: Ability to adjust to condition or change in health will improve 11/17/2023 1212 by Teressa Nest, RN Outcome: Adequate for Discharge 11/17/2023 1211 by Teressa Nest, RN Outcome: Adequate for Discharge   Problem: Health Behavior/Discharge Planning: Goal: Ability to identify and utilize available resources and services will improve 11/17/2023 1212 by Teressa Nest, RN Outcome: Adequate for Discharge 11/17/2023 1211 by Teressa Nest, RN Outcome: Adequate for Discharge Goal: Ability to manage health-related needs will  improve 11/17/2023 1212 by Teressa Nest, RN Outcome: Adequate for Discharge 11/17/2023 1211 by Teressa Nest, RN Outcome: Adequate for Discharge   Problem: Fluid Volume: Goal: Ability to maintain a balanced intake and output will improve 11/17/2023 1212 by Teressa Nest, RN Outcome: Adequate for Discharge 11/17/2023 1211 by Teressa Nest, RN Outcome: Adequate for Discharge   Problem: Metabolic: Goal: Ability to maintain appropriate glucose levels will improve 11/17/2023 1212 by Teressa Nest, RN Outcome: Adequate for Discharge 11/17/2023 1211 by Teressa Nest, RN Outcome: Adequate for Discharge   Problem: Nutritional: Goal: Maintenance of adequate nutrition will improve 11/17/2023 1212 by Teressa Nest, RN Outcome: Adequate for Discharge 11/17/2023 1211 by Teressa Nest, RN Outcome: Adequate for Discharge Goal: Progress toward achieving an optimal weight will improve 11/17/2023 1212 by Teressa Nest, RN Outcome: Adequate for Discharge 11/17/2023 1211 by Teressa Nest, RN Outcome: Adequate for Discharge   Problem: Tissue Perfusion: Goal: Adequacy of tissue perfusion will improve 11/17/2023 1212 by Teressa Nest, RN Outcome: Adequate for Discharge 11/17/2023 1211 by Teressa Nest, RN Outcome: Adequate for Discharge   Problem: Health Behavior/Discharge Planning: Goal: Ability to manage health-related needs will improve 11/17/2023 1212 by Teressa Nest, RN Outcome: Adequate for Discharge 11/17/2023 1211 by Teressa Nest, RN Outcome: Adequate for Discharge   Problem: Clinical Measurements: Goal: Ability to maintain clinical measurements within normal limits will improve 11/17/2023 1212 by Teressa Nest, RN Outcome: Adequate for Discharge 11/17/2023 1211 by Teressa Nest, RN Outcome: Adequate for Discharge Goal: Will remain free from infection 11/17/2023 1212 by Teressa Nest, RN Outcome: Adequate for Discharge 11/17/2023 1211 by  Teressa Nest, RN Outcome: Adequate for Discharge Goal: Diagnostic test results will improve 11/17/2023 1212 by Teressa Nest, RN Outcome: Adequate for Discharge 11/17/2023 1211 by Teressa Nest, RN Outcome: Adequate for Discharge Goal: Respiratory complications will improve 11/17/2023 1212 by Teressa Nest, RN Outcome: Adequate for Discharge 11/17/2023 1211 by Teressa Nest, RN Outcome: Adequate for Discharge Goal: Cardiovascular complication  will be avoided 11/17/2023 1212 by Teressa Nest, RN Outcome: Adequate for Discharge 11/17/2023 1211 by Teressa Nest, RN Outcome: Adequate for Discharge   Problem: Activity: Goal: Risk for activity intolerance will decrease 11/17/2023 1212 by Teressa Nest, RN Outcome: Adequate for Discharge 11/17/2023 1211 by Teressa Nest, RN Outcome: Adequate for Discharge   Problem: Nutrition: Goal: Adequate nutrition will be maintained 11/17/2023 1212 by Teressa Nest, RN Outcome: Adequate for Discharge 11/17/2023 1211 by Teressa Nest, RN Outcome: Adequate for Discharge   Problem: Coping: Goal: Level of anxiety will decrease 11/17/2023 1212 by Teressa Nest, RN Outcome: Adequate for Discharge 11/17/2023 1211 by Teressa Nest, RN Outcome: Adequate for Discharge   Problem: Elimination: Goal: Will not experience complications related to bowel motility 11/17/2023 1212 by Teressa Nest, RN Outcome: Adequate for Discharge 11/17/2023 1211 by Teressa Nest, RN Outcome: Adequate for Discharge Goal: Will not experience complications related to urinary retention 11/17/2023 1212 by Teressa Nest, RN Outcome: Adequate for Discharge 11/17/2023 1211 by Teressa Nest, RN Outcome: Adequate for Discharge   Problem: Pain Managment: Goal: General experience of comfort will improve and/or be controlled 11/17/2023 1212 by Teressa Nest, RN Outcome: Adequate for Discharge 11/17/2023 1211 by Teressa Nest,  RN Outcome: Adequate for Discharge   Problem: Safety: Goal: Ability to remain free from injury will improve 11/17/2023 1212 by Teressa Nest, RN Outcome: Adequate for Discharge 11/17/2023 1211 by Teressa Nest, RN Outcome: Adequate for Discharge   Problem: Skin Integrity: Goal: Risk for impaired skin integrity will decrease 11/17/2023 1212 by Teressa Nest, RN Outcome: Adequate for Discharge 11/17/2023 1211 by Teressa Nest, RN Outcome: Adequate for Discharge

## 2023-11-17 NOTE — Plan of Care (Signed)
   Problem: Fluid Volume: Goal: Hemodynamic stability will improve Outcome: Progressing

## 2023-11-17 NOTE — Discharge Summary (Addendum)
 Physician Discharge Summary   [name] MRN: [id number] DOB: 1960-08-11  Admit date:     10/30/2023  Discharge date: 11/17/23  Discharge Physician: Charlie Patterson   PCP: Glenard Mire, MD   Recommendations at discharge:   Follow-up acute rehab 1 day. Follow-up podiatry Dr. Lennie 1 week Follow-up with Dr. Searcy around 8days  Discharge Diagnoses: Principal Problem:   Sepsis due to cellulitis [facility]) Active Problems:   Toe ulcer (HCC)   Drop in hemoglobin   Pleural effusion on right   AKI (acute kidney injury)   Parkinson's disease (HCC)   GERD without esophagitis   Chronic diastolic CHF (congestive heart failure) (HCC)   Dyslipidemia   Asthma, chronic   Hyperkalemia   Hyponatremia   Gout   Protein-calorie malnutrition, severe   Pressure injury   Leg swelling   Foot abscess, right   Hypomagnesemia   Serratia marcescens infection   Gram-negative bacteremia  Resolved Problems:   * No resolved hospital problems. *  Hospital Course: 63 y.o. female with a PMH significant for anxiety, osteoarthritis, asthma, COPD, depression, type 2 diabetes mellitus, GERD, dyslipidemia, and HTN, who presented to the emergency room with acute onset of generalized weakness and bilateral lower extremity swelling after being found down after several hours.   ED Course: BP was 109/53 with heart rate of 114 and later BP was 96/52 and temperature was 99.8, respiratory rate was 22. Labs reveal hyponatremia 128 and hyperkalemia of 6 with a CO2 of 13 and glucose of 126, BUN of 42 and creatinine 3.64 calcium  of 7.8 and albumin  2 with total protein 5.7.  BNP was 78.5 and high-sensitivity troponin I was 14.  Lactic acid was 2.6 and CBC showed hemoglobin 11.8 and hematocrit 36.9.  Respiratory panel came back negative.  Blood cultures were drawn. EKG: sinus tachycardia with rate 118 with poor R wave progression. Imaging: Portable chest x-ray showed emphysema with no acute  cardiopulmonary disease. The patient was given IV vancomycin  and  IV cefepime .  On initial evaluation- patient was lethargic and minimally responsive. BP low and not responding to IV fluids or increase in midodrine . CCM was consulted who took over care for patient who required vasopressors. Weaned from them 9/14. NG tube was placed for nutrition as she was unable to tolerate PO while incapacitated.  9/10.  Blood cultures positive for Serratia. 9/15: transferred back to [facility]   11/12/23 -patient BP stable, will wean midodrine . PO intake is low but consistent, will dc NG tube and add PO supplements.  Respiratory status- improved back to baseline O2 use. 9/24.  I was able to squeeze out some pus from her right foot.  MRI did not show any evidence of osteomyelitis and no organized fluid collection identified.  Case discussed with podiatry and he would like to take to the operating room for washout procedure.  MRI of the left foot ordered because he does not like the way a toe on the left foot looks.  Stopped Augmentin  and started Rocephin  9/25.  Dr. Lennie podiatry took to the operating room for right foot incision and drainage of right midfoot abscess, right foot wound debridement, left foot wound debridement and left second toe amputation 9/26.  Serratia growing out of wound culture.  Case discussed with infectious disease specialist and can continue Rocephin  while here and then likely switch over to Cipro  for around another week upon discharge. 9/27.  Patient still had a lot of pain this morning was on IV morphine .  Spoke with nursing staff and patient about using oral medications and only using IV if absolutely necessary.  Unable to do IV medications at the rehab. 9/28.  Patient's pain under better control with oral pain.  Will discharge to rehab facility.  Will give IV Rocephin  today prior to going and switch over to oral Cipro  for 9 more days upon discharge.  Assessment and Plan: * Sepsis due to  cellulitis [facility]) Present on admission.  Cellulitis and abscess foot.  Blood cultures on 9/10 grew out Serratia.  Wound culture on 9/23 and 9/24 growing Serratia also.  MRI does not show osteomyelitis or an abscess.  Podiatry brought to the operating room 9/25 for incision and drainage of abscess right foot.  Patient on Rocephin .  ID consultation recommended continuing Rocephin  while here and switching over to Cipro  for 9 more days.  Heel contact (partial weightbearing) with right foot and postop shoe.  Toe ulcer (HCC) Second toe left foot suspected osteomyelitis.  Dr. Lennie took to the operating room on 9/25 second toe amputation on left foot.  Can weight-bear as tolerated with postop shoe  Drop in hemoglobin Last hemoglobin down to 7.9.   No signs of bleeding.  Did have a thoracentesis.  Ferritin elevated going along with anemia of chronic disease.  Pleural effusion on right Interventional radiology drew off 800 mL of fluid on 9/23.  AKI (acute kidney injury) Creatinine 3.64 on presentation and currently 1.19.  Likely underlying ckd stage 3a.  GERD without esophagitis Continue PPI therapy.  Parkinson's disease (HCC) Will continue Sinemet  CR and Sinemet  IR.  Dyslipidemia Will continue statin therapy.  Chronic diastolic CHF (congestive heart failure) (HCC) On low-dose torsemide   Hyponatremia Last sodium normal range  Hyperkalemia Resolved after acute kidney injury improved.   Asthma, chronic Will continue theophylline  (will lower dose while on cipro ), and inhalers  Hypomagnesemia Replaced  Pressure injury Wound 09/08/23 1300 Pressure Injury Sacrum Medial Stage 3 -  Full thickness tissue loss. Subcutaneous fat may be visible but bone, tendon or muscle are NOT exposed. (Active)     Wound 09/22/23 2320 Pressure Injury Buttocks Left Stage 1 -  Intact skin with non-blanchable redness of a localized area usually over a bony prominence. (Active)     Wound 09/22/23 2320 Pressure  Injury Buttocks Right Stage 2 -  Partial thickness loss of dermis presenting as a shallow open injury with a red, pink wound bed without slough. (Active)     Wound 10/31/23 1900 Pressure Injury Buttocks Bilateral Deep Tissue Pressure Injury - Purple or maroon localized area of discolored intact skin or blood-filled blister due to damage of underlying soft tissue from pressure and/or shear. (Active)   Wound care for sacrum.  Cleanse sacrum with Vashe wound cleanser, rinse well to area dry.  Apply Xeroform Gauze ([id number]) to wound bed daily and secure with silicone foam every other day.  Legs: removed dressings, cleanse wounds with vashe clenser, apply xeroform to leg wounds and incision site, 4x4, abd pad kerlix wrap and ace wrap from forefoot to below knee.  Protein-calorie malnutrition, severe Continue supplements  Gout Continue colchicine  as needed         Consultants: Infectious disease. podiatry Procedures performed: 2nd toe amputation, right fot abscess drainage Disposition: Rehabilitation facility Diet recommendation:  Regular DISCHARGE MEDICATION: Allergies as of 11/17/2023       Reactions   Augmentin  [amoxicillin -pot Clavulanate] Diarrhea   Penicillins Itching        Medication List  STOP taking these medications    ascorbic acid  500 MG tablet Commonly known as: VITAMIN C    aspirin  EC 81 MG tablet   carbidopa -levodopa  25-100 MG tablet Commonly known as: SINEMET  IR Replaced by: carbidopa -levodopa  10-100 MG tablet You also have another medication with the same name that you need to continue taking as instructed.   cyclobenzaprine  10 MG tablet Commonly known as: FLEXERIL    diphenoxylate -atropine  2.5-0.025 MG tablet Commonly known as: LOMOTIL    empagliflozin  25 MG Tabs tablet Commonly known as: Jardiance    furosemide  40 MG tablet Commonly known as: LASIX    levocetirizine 5 MG tablet Commonly known as: XYZAL    midodrine  5 MG tablet Commonly  known as: PROAMATINE    montelukast  10 MG tablet Commonly known as: SINGULAIR    theophylline  400 MG 24 hr tablet Commonly known as: UNIPHYL Replaced by: theophylline  200 MG 24 hr capsule   zinc  sulfate (50mg  elemental zinc ) 220 (50 Zn) MG capsule       TAKE these medications    acetaminophen  650 MG suppository Commonly known as: TYLENOL  Place 1 suppository (650 mg total) rectally every 4 (four) hours as needed for fever (> 101.5).   calcium  carbonate 500 MG chewable tablet Commonly known as: TUMS - dosed in mg elemental calcium  Chew 2 tablets (400 mg of elemental calcium  total) by mouth 3 (three) times daily as needed for indigestion or heartburn.   carbidopa -levodopa  50-200 MG tablet Commonly known as: SINEMET  CR Take 1 tablet by mouth at bedtime. What changed:  Another medication with the same name was added. Make sure you understand how and when to take each. Another medication with the same name was removed. Continue taking this medication, and follow the directions you see here.   carbidopa -levodopa  10-100 MG tablet Commonly known as: SINEMET  IR Take 1 tablet by mouth 3 (three) times daily with meals. What changed: You were already taking a medication with the same name, and this prescription was added. Make sure you understand how and when to take each. Replaces: carbidopa -levodopa  25-100 MG tablet   ciprofloxacin  500 MG tablet Commonly known as: Cipro  Take 1 tablet (500 mg total) by mouth 2 (two) times daily for 9 days. Start taking on: November 18, 2023   colchicine  0.6 MG tablet Take 1 tablet (0.6 mg total) by mouth 2 (two) times daily as needed for up to 3 days (gout flare).   DULoxetine  60 MG capsule Commonly known as: CYMBALTA  Take 1 capsule (60 mg total) by mouth daily.   enoxaparin  40 MG/0.4ML injection Commonly known as: LOVENOX  Inject 0.4 mLs (40 mg total) into the skin daily for 14 days. Start taking on: November 18, 2023   gabapentin  300 MG  capsule Commonly known as: NEURONTIN  Take 2 capsules (600 mg total) by mouth 2 (two) times daily AND 2 capsules (600 mg total) at bedtime.   ipratropium-albuterol  0.5-2.5 (3) MG/3ML Soln Commonly known as: DuoNeb Inhale 3 mLs into the lungs every 6 (six) hours as needed.   leptospermum manuka honey Pste paste Apply 1 Application topically daily. Apply to sacral wound Apply thin layer (3 mm) to wound.   lidocaine  5 % Commonly known as: LIDODERM  Place 1 patch onto the skin daily. Remove & Discard patch within 12 hours or as directed by MD   lipase/protease/amylase 12000-38000 units Cpep capsule Commonly known as: CREON  Take 1 capsule (12,000 Units total) by mouth 3 (three) times daily before meals.   multivitamin with minerals Tabs tablet Take 1 tablet by mouth daily.  omeprazole  40 MG capsule Commonly known as: PRILOSEC Take 1 capsule (40 mg total) by mouth daily.   oxyCODONE  5 MG immediate release tablet Commonly known as: Oxy IR/ROXICODONE  Take 1 tablet (5 mg total) by mouth every 6 (six) hours as needed for moderate pain (pain score 4-6) or severe pain (pain score 7-10). What changed: when to take this   QUEtiapine  25 MG tablet Commonly known as: SEROQUEL  Take 1 tablet (25 mg total) by mouth at bedtime.   rosuvastatin  5 MG tablet Commonly known as: CRESTOR  Take 1 tablet (5 mg total) by mouth at bedtime.   Symbicort  160-4.5 MCG/ACT inhaler Generic drug: budesonide -formoterol  INHALE 2 PUFFS TWICE A DAY RINSE MOUTH WITH WATER  AFTER EACH USE   theophylline  200 MG 24 hr capsule Commonly known as: THEO-24 Take 1 capsule (200 mg total) by mouth daily. Replaces: theophylline  400 MG 24 hr tablet   tiotropium 18 MCG inhalation capsule Commonly known as: SPIRIVA  Place 1 capsule into inhaler and inhale daily. pm   torsemide  20 MG tablet Commonly known as: DEMADEX  Take 1 tablet (20 mg total) by mouth daily. Start taking on: November 18, 2023   Ventolin  HFA 108 (90  Base) MCG/ACT inhaler Generic drug: albuterol  INHALE 1 PUFF BY MOUTH AS NEEDED   Vitamin D  (Ergocalciferol ) 1.25 MG (50000 UNIT) Caps capsule Commonly known as: DRISDOL  Take 1 capsule (50,000 Units total) by mouth every 7 (seven) days. Start taking on: November 21, 2023        Contact information for follow-up providers     Lennie Barter, DPM Follow up in 1 week(s).   Specialty: Podiatry Contact information: [address] [place] [address] [phone]         Fayette Bodily, MD Follow up in 8 day(s).   Specialty: Infectious Diseases Contact information: [address] [place] [address] [phone]              Contact information for after-discharge care     Destination     [facility] .   Service: Skilled Nursing Contact information: [address] [place]  [address] [phone]                    Discharge Exam: Filed Weights   11/15/23 0424 11/16/23 0500 11/17/23 0500  Weight: 63.6 kg 61.9 kg 59.7 kg   Physical Exam HENT:     Head: Normocephalic.  Eyes:     General: Lids are normal.     Conjunctiva/sclera: Conjunctivae normal.  Cardiovascular:     Rate and Rhythm: Normal rate and regular rhythm.     Heart sounds: Normal heart sounds, S1 normal and S2 normal.  Pulmonary:     Breath sounds: No decreased breath sounds, wheezing, rhonchi or rales.  Abdominal:     Palpations: Abdomen is soft.     Tenderness: There is no abdominal tenderness.  Musculoskeletal:     Right lower leg: Swelling present.     Left lower leg: Swelling present.  Skin:    General: Skin is warm.     Comments: Bilateral legs wrapped  Neurological:     Mental Status: She is alert and oriented to person, place, and time.      Condition at discharge: stable  The results of significant diagnostics from this hospitalization (including imaging, microbiology, ancillary and laboratory) are listed  below for reference.   Imaging Studies: DG MINI C-ARM IMAGE ONLY Result Date: 11/14/2023 There is no interpretation  for this exam.  This order is for images obtained during a surgical procedure.  Please See Surgeries Tab for more information regarding the procedure.   DG Chest Port 1 View Result Date: 11/13/2023 CLINICAL DATA:  Right-sided chest pain EXAM: PORTABLE CHEST 1 VIEW COMPARISON:  11/12/2023 FINDINGS: Shallow inspiration. Heart size and pulmonary vascularity are normal. Increasing infiltration or atelectasis in the right lung base with small right pleural effusion. This may represent compressive atelectasis or pneumonia. Suggestion of developing patchy infiltration in the left medial lung base. Emphysematous changes and fibrosis in the lungs. No pneumothorax. Mediastinal contours appear intact. Degenerative changes in the shoulders. IMPRESSION: Small right pleural effusion. Increasing infiltration or atelectasis in the right base and developing infiltration in the medial left base. Electronically Signed   By: Elsie Gravely M.D.   On: 11/13/2023 21:29   MR FOOT LEFT WO CONTRAST Result Date: 11/13/2023 CLINICAL DATA:  Diabetic foot swelling.  Foot pain. EXAM: MRI OF THE LEFT FOOT WITHOUT CONTRAST TECHNIQUE: Multiplanar, multisequence MR imaging of the left foot from the midfoot through the toes was performed. No intravenous contrast was administered. COMPARISON:  Radiographs 11/13/2023 and CT scan 10/31/2023 FINDINGS: Despite efforts by the technologist and patient, motion artifact is present on today's exam and could not be eliminated. This reduces exam sensitivity and specificity. Bones/Joint/Cartilage Hallux valgus. Bony erosion medially along the first metatarsal head, probably related to gout arthropathy. Degenerative findings along the articulation between the first digit sesamoids in the first metatarsal head. Subtle edema in the head of the second metatarsal on image 17 series 9, given  the subtlety I favor reactive edema with the overlying wound over early osteomyelitis. No overt bony destructive findings. Ligaments Lisfranc ligament intact. Muscles and Tendons Regional muscular atrophy. Lobularity along the plantar fascia raising possibility of mild plantar fibromatosis. Soft tissues Plantar subcutaneous edema along the forefoot. Ulceration/wound dorsally along the second toe about the proximal interphalangeal joint. IMPRESSION: 1. Ulceration/wound dorsally along the second toe about the proximal interphalangeal joint. Subtle edema in the head of the second metatarsal, given the subtlety I favor reactive edema with the overlying wound over early osteomyelitis. 2. Hallux valgus with bony erosion medially along the first metatarsal head, probably related to gout arthropathy. 3. Degenerative findings along the articulation between the first digit sesamoids and the first metatarsal head. 4. Regional muscular atrophy. 5. Lobularity along the plantar fascia raising possibility of mild plantar fibromatosis. Electronically Signed   By: Ryan Salvage M.D.   On: 11/13/2023 16:01   DG Foot Complete Left Result Date: 11/13/2023 CLINICAL DATA:  Osteomyelitis of left foot. EXAM: LEFT FOOT - COMPLETE 3+ VIEW COMPARISON:  Subsequent MRI, available at time of radiograph interpretation FINDINGS: No obvious erosive or bony destructive change. Hallux valgus and degenerative changes of the first metatarsal phalangeal joint. Hammertoe deformity of the digits. No fracture. Moderate plantar calcaneal spur. Moderate Achilles tendon enthesophyte. No soft tissue gas or radiopaque foreign body. Mild generalized soft tissue edema IMPRESSION: 1. No radiographic findings of osteomyelitis. 2. Hallux valgus and degenerative changes of the first metatarsophalangeal joint. Electronically Signed   By: Andrea Gasman M.D.   On: 11/13/2023 14:57   MR FOOT RIGHT WO CONTRAST Result Date: 11/12/2023 CLINICAL DATA:  Plantar  foot wound.  Evaluate for osteomyelitis. EXAM: MRI OF THE RIGHT FOREFOOT WITHOUT CONTRAST TECHNIQUE: Multiplanar, multisequence MR imaging of the right forefoot was performed. No intravenous contrast was administered. COMPARISON:  CT of the right lower leg and foot 10/31/2023. Remote  right foot radiographs 09/10/2012. FINDINGS: Technical note: Despite efforts by the technologist and patient, mild motion artifact is present on today's exam and could not be eliminated. This reduces exam sensitivity and specificity. Bones/Joint/Cartilage No evidence of acute fracture, dislocation or osteomyelitis. There is a stable old healed fracture of the 5th metatarsal shaft. Mild degenerative changes at the 1st metatarsophalangeal joint with chronic erosive changes medially in the 1st metatarsal head. Probable mild reactive marrow edema within the tibial sesamoid of the 1st metatarsal. No significant joint effusions. The alignment is normal at the Lisfranc joint. Ligaments Intact Lisfranc ligament. The collateral ligaments of the metatarsophalangeal joints appear intact. Muscles and Tendons Mild edema within the plantar forefoot musculature. No evidence of tendon tear or significant tenosynovitis. Soft tissues Soft tissue ulceration within the plantar aspect of the midfoot at the level of the Lisfranc joint. There is ill-defined T2 hyperintensity within the soft tissues superficial to the plantar fascia. No organized fluid collection is identified. There is generalized forefoot subcutaneous edema which may reflect cellulitis. IMPRESSION: 1. Soft tissue ulceration within the plantar aspect of the midfoot at the level of the Lisfranc joint with ill-defined T2 hyperintensity within the soft tissues superficial to the plantar fascia which may reflect cellulitis or phlegmon. No organized fluid collection identified. 2. No evidence of osteomyelitis. 3. Mild degenerative changes at the 1st metatarsophalangeal joint with chronic erosive  changes medially in the 1st metatarsal head. 4. Stable old healed fracture of the 5th metatarsal shaft. Electronically Signed   By: Elsie Perone M.D.   On: 11/12/2023 15:26   DG Chest Port 1 View Result Date: 11/12/2023 CLINICAL DATA:  Chest discomfort.  Post thoracentesis. EXAM: PORTABLE CHEST 1 VIEW COMPARISON:  Radiographs 10/30/2023 and 09/22/2023.  CT 11/10/2023. FINDINGS: 1129 hours. The heart size and mediastinal contours are stable with aortic atherosclerosis. Small right pleural effusion appears improved from previous CT. Mild patchy airspace opacities at both lung bases. No evidence of pneumothorax. The bones appear unchanged. IMPRESSION: No evidence of pneumothorax following thoracentesis. Small right pleural effusion appears improved. Electronically Signed   By: Elsie Perone M.D.   On: 11/12/2023 12:08   US  THORACENTESIS ASP PLEURAL SPACE W/IMG GUIDE Result Date: 11/12/2023 INDICATION: 63 year old female admitted for sepsis with cellulitis, and found to have a moderate right pleural effusion. IR was requested for diagnostic and therapeutic right-sided thoracentesis. EXAM: ULTRASOUND GUIDED DIAGNOSTIC AND THERAPEUTIC RIGHT-SIDED THORACENTESIS MEDICATIONS: 5 cc of 1% lidocaine . COMPLICATIONS: None immediate. PROCEDURE: An ultrasound guided thoracentesis was thoroughly discussed with the patient and questions answered. The benefits, risks, alternatives and complications were also discussed. The patient understands and wishes to proceed with the procedure. Written consent was obtained. Ultrasound was performed to localize and mark an adequate pocket of fluid in the right chest. The area was then prepped and draped in the normal sterile fashion. 1% Lidocaine  was used for local anesthesia. Under ultrasound guidance a 6 Fr Safe-T-Centesis catheter was introduced. Thoracentesis was performed. The catheter was removed and a dressing applied. FINDINGS: A total of approximately 800 mL of clear,  straw-colored pleural fluid was removed. Samples were sent to the laboratory as requested by the clinical team. IMPRESSION: Successful ultrasound guided left thoracentesis yielding 0.80 L of pleural fluid. Procedure performed by Carlin Griffon, PA-C Electronically Signed   By: Cordella Banner   On: 11/12/2023 11:27   DG Swallowing Func-Speech Pathology Result Date: 11/11/2023 Table formatting from the original result was not included. Modified Barium Swallow Study Patient Details Name: Kaitlyn Franko  Kabler MRN: [id number] Date of Birth: August 06, 1960 Today's Date: 11/11/2023 HPI/PMH: HPI: EDWIN CHERIAN is a 63 y.o. female who presented to ED  on 10/30/2023 with acute onset of generalizated weakness and BLE swelling (L>R) after being found on the ground for several hours covered in urine and feces. Pt with a PMH significant for anxiety, osteoarthritis, asthma, COPD, depression, type 2 diabetes mellitus, GERD, dyslipidemia, and hypertension. CXR 1. Emphysema and background scarring unchanged since prior study. No  acute airspace disease.  During hospitalization, pt was evaluated/treated by ST with diet advanced to dysphagia 3 with thin liquids. Pt received NGT placement for external nutrition. NGT was discharged on 11/08/2023. ST consulted for diet recommendation. Clinical Impression: Clinical Impression: Pt presents with minimal oropharyngeal dysphagia. Pharyngeal phase notable for min reduced laryngeal vestibule closure resultant in x1 occurrence of micro aspiration and shallow penetration (trace coating on the underside of the epiglottis) with thin liquid. Of note, micro aspiration occurring on first captured swallow and not repeated despite thorough testing. Shallow, trace penetration occurring inconsistently, and did not advance into laryngeal vestibule. Oral phase notable for impaired mastication, directly impacted by lack of natural dentition.     Overall, suspect that current presentation is likely  close to baseline oropharyngeal swallow function. However, current outstanding factors (medical compromise, limited mobility/deconditioning) and baseline GI (GERD) and resp (COPD) components increase risk for aspiration and adverse events. Recommend continued regular solids and thin liquids with aspiration precautions (slow rate, small bites, elevated HOB, alert for PO intake). Emphasize oral care to reduce bacterial collection and mobility to aid pulmonary clearance. No follow up SLP services indicated. Factors that may increase risk of adverse event in presence of aspiration Noe & Lianne 2021): Factors that may increase risk of adverse event in presence of aspiration Noe & Lianne 2021): Poor general health and/or compromised immunity; Respiratory or GI disease; Limited mobility; Frail or deconditioned Recommendations/Plan: Swallowing Evaluation Recommendations Swallowing Evaluation Recommendations Recommendations: PO diet PO Diet Recommendation: Regular; Thin liquids (Level 0) Liquid Administration via: Straw; Cup Medication Administration: Whole meds with puree (vs liquid) Supervision: Patient able to self-feed; Intermittent supervision/cueing for swallowing strategies Swallowing strategies  : Minimize environmental distractions; Slow rate; Small bites/sips Postural changes: Position pt fully upright for meals; Stay upright 30-60 min after meals Oral care recommendations: Oral care BID (2x/day); Staff/trained caregiver to provide oral care Treatment Plan Treatment Plan Treatment recommendations: No treatment recommended at this time Follow-up recommendations: No SLP follow up Functional status assessment: Patient has not had a recent decline in their functional status. Recommendations Recommendations for follow up therapy are one component of a multi-disciplinary discharge planning process, led by the attending physician.  Recommendations may be updated based on patient status, additional functional  criteria and insurance authorization. Assessment: Orofacial Exam: Orofacial Exam Oral Cavity: Oral Hygiene: WFL Oral Cavity - Dentition: Edentulous Orofacial Anatomy: WFL Oral Motor/Sensory Function: WFL Anatomy: Anatomy: WFL Boluses Administered: Boluses Administered Boluses Administered: Thin liquids (Level 0); Mildly thick liquids (Level 2, nectar thick); Moderately thick liquids (Level 3, honey thick); Puree; Solid  Oral Impairment Domain: Oral Impairment Domain Lip Closure: No labial escape Tongue control during bolus hold: Cohesive bolus between tongue to palatal seal Bolus preparation/mastication: Disorganized chewing/mashing with solid pieces of bolus unchewed (baseline secondary to dentition) Bolus transport/lingual motion: Brisk tongue motion Oral residue: Trace residue lining oral structures Location of oral residue : Tongue; Floor of mouth Initiation of pharyngeal swallow : Pyriform sinuses  Pharyngeal Impairment Domain: Pharyngeal Impairment Domain Soft palate  elevation: No bolus between soft palate (SP)/pharyngeal wall (PW) Laryngeal elevation: Complete superior movement of thyroid  cartilage with complete approximation of arytenoids to epiglottic petiole Anterior hyoid excursion: Complete anterior movement Epiglottic movement: Complete inversion Laryngeal vestibule closure: Incomplete, narrow column air/contrast in laryngeal vestibule Pharyngeal stripping wave : Present - complete Pharyngeal contraction (A/P view only): N/A Pharyngoesophageal segment opening: Complete distension and complete duration, no obstruction of flow Tongue base retraction: No contrast between tongue base and posterior pharyngeal wall (PPW) Pharyngeal residue: Trace residue within or on pharyngeal structures Location of pharyngeal residue: Valleculae  Esophageal Impairment Domain: Esophageal Impairment Domain Esophageal clearance upright position: Complete clearance, esophageal coating Pill: Pill Consistency administered: --  (n/a) Penetration/Aspiration Scale Score: Penetration/Aspiration Scale Score 1.  Material does not enter airway: Mildly thick liquids (Level 2, nectar thick); Moderately thick liquids (Level 3, honey thick); Puree; Solid 3.  Material enters airway, remains ABOVE vocal cords and not ejected out: Thin liquids (Level 0) 8.  Material enters airway, passes BELOW cords without attempt by patient to eject out (silent aspiration) : Thin liquids (Level 0) (x1 instance of micro aspiration) Compensatory Strategies: Compensatory Strategies Compensatory strategies: No   General Information: Caregiver present: No  Diet Prior to this Study: Regular; Thin liquids (Level 0)   Temperature : Normal   Respiratory Status: WFL   Supplemental O2: None (Room air)   History of Recent Intubation: No  Behavior/Cognition: Alert; Cooperative; Pleasant mood Self-Feeding Abilities: Able to self-feed Baseline vocal quality/speech: Normal Volitional Cough: Able to elicit Volitional Swallow: Able to elicit Exam Limitations: No limitations Goal Planning: No data recorded No data recorded No data recorded Patient/Family Stated Goal: none present Consulted and agree with results and recommendations: Patient; Physician; Nurse Pain: Pain Assessment Pain Assessment: 0-10 Pain Score: 8 Faces Pain Scale: 6 Pain Location: R foot neuropathy Pain Descriptors / Indicators: Pins and needles; Stabbing Pain Intervention(s): Limited activity within patient's tolerance; Monitored during session; Patient requesting pain meds-RN notified End of Session: Start Time:SLP Start Time (ACUTE ONLY): 1100 Stop Time: SLP Stop Time (ACUTE ONLY): 1145 Time Calculation:SLP Time Calculation (min) (ACUTE ONLY): 45 min Charges: SLP Evaluations $ SLP Speech Visit: 1 Visit SLP Evaluations $BSS Swallow: 1 Procedure $MBS Swallow: 1 Procedure SLP visit diagnosis: SLP Visit Diagnosis: Dysphagia, oropharyngeal phase (R13.12) Past Medical History: Past Medical History: Diagnosis Date   Anxiety   Arthritis   joints and hands/ knees  Asthma   uses inhaler  Benign essential tremor   head  Cervical dystonia   neck pain  Cholesteatoma of left ear   x2  COPD (chronic obstructive pulmonary disease) (HCC)   Cough   Depression   Diabetes mellitus without complication (HCC)   type 2  Diastolic dysfunction   Dyspnea   Dysrhythmia   diastolic dysfunction  GERD (gastroesophageal reflux disease)   Headache   migraines/ one per week  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  Hyperlipidemia   Hypertension   Motion sickness   boat  Neuromuscular disorder (HCC)   neuropathy feet and hands( nerve damage)  Wears dentures   upper and lower Past Surgical History: Past Surgical History: Procedure Laterality Date  CARPAL TUNNEL RELEASE Bilateral   x2 right, 1x on left  COLONOSCOPY    COLONOSCOPY WITH PROPOFOL  N/A 04/25/2017  Procedure: COLONOSCOPY WITH PROPOFOL ;  Surgeon: Jinny Carmine, MD;  Location: [facility];  Service: Endoscopy;  Laterality: N/A;  diabetic-oral med  DILATION AND CURETTAGE OF UTERUS    ESOPHAGOGASTRODUODENOSCOPY (  EGD) WITH PROPOFOL  N/A 01/10/2021  Procedure: ESOPHAGOGASTRODUODENOSCOPY (EGD) WITH PROPOFOL ;  Surgeon: Janalyn Keene NOVAK, MD;  Location: [facility];  Service: Endoscopy;  Laterality: N/A;  Diabetic  EXTERNAL EAR SURGERY Left   x2  POLYPECTOMY  04/25/2017  Procedure: POLYPECTOMY INTESTINAL;  Surgeon: Jinny Carmine, MD;  Location: [facility];  Service: Endoscopy;;  SPINE SURGERY    herniated disc  TUBAL LIGATION   [place] Jarrett Clapp, MS, CCC-SLP Speech Language Pathologist Rehab Services; [facility] [place] [phone] (ascom) [place] J Clapp 11/11/2023, 12:36 PM  CT Angio Chest Pulmonary Embolism (PE) W or WO Contrast Result Date: 11/10/2023 EXAM: CTA of the Chest with contrast for PE 11/10/2023 10:44:08 PM TECHNIQUE: CTA of the chest was performed after the administration of intravenous contrast. Multiplanar reformatted images are provided for review. MIP  images are provided for review. Automated exposure control, iterative reconstruction, and/or weight based adjustment of the mA/kV was utilized to reduce the radiation dose to as low as reasonably achievable. COMPARISON: 09/22/2023 CLINICAL HISTORY: Pulmonary embolism (PE) suspected, low to intermediate prob, positive D-dimer. FINDINGS: PULMONARY ARTERIES: Pulmonary arteries are adequately opacified for evaluation. No pulmonary embolism. Main pulmonary artery is normal in caliber. MEDIASTINUM: The heart and pericardium demonstrate no acute abnormality. Mild thoracic aortic atherosclerosis. There is no acute abnormality of the thoracic aorta. LYMPH NODES: Small mediastinal lymph nodes, including a dominant 12 mm short-axis subcarinal node, likely reactive. LUNGS AND PLEURA: Multifocal patchy/nodular opacities, right middle lobe and lingular predominant, suggesting multifocal pneumonia, possibly on the basis of aspiration. Moderate right pleural effusion with associated compressive atelectasis in the right lower lobe. Trace left pleural effusion. UPPER ABDOMEN: Layering gallstones, without associated inflammatory changes. Mild-to-moderate bilateral hydronephrosis, chronic, improved. Moderate upper abdominal ascites, progressive. Mild hepatic steatosis. SOFT TISSUES AND BONES: No acute bone or soft tissue abnormality. IMPRESSION: 1. No evidence of pulmonary embolism. 2. Multifocal pneumonia, possibly on the basis of aspiration, as above. 3. Moderate right pleural effusion. Trace left pleural effusion. 4. Additional ancillary findings as above. Electronically signed by: Pinkie Pebbles MD 11/10/2023 10:49 PM EDT RP Workstation: [id number]   DG Abd 1 View Result Date: 11/03/2023 CLINICAL DATA:  63 year old female. Enteric tube placement. Abdominal distension. EXAM: ABDOMEN - 1 VIEW COMPARISON:  11/01/2023 and earlier. FINDINGS: Portable AP supine views at 0529 hours. Enteric tube terminates in the stomach, side hole the  level of the gastric fundus. Lung bases appear stable and negative. Non obstructed bowel gas pattern. Left femoral approach catheter in place. Aortoiliac calcified atherosclerosis. No acute osseous abnormality identified. IMPRESSION: 1. Stable and satisfactory enteric tube placement in the stomach. 2. Non obstructed bowel gas pattern. 3.  Aortic Atherosclerosis (ICD10-I70.0). Electronically Signed   By: VEAR Hurst M.D.   On: 11/03/2023 09:42   US  ARTERIAL ABI (SCREENING LOWER EXTREMITY) Result Date: 11/02/2023 CLINICAL DATA:  Peripheral arterial disease EXAM: NONINVASIVE PHYSIOLOGIC VASCULAR STUDY OF BILATERAL LOWER EXTREMITIES TECHNIQUE: Evaluation of both lower extremities were performed at rest, including calculation of ankle-brachial indices with single level Doppler, pressure and pulse volume recording. COMPARISON:  None Available. FINDINGS: Right ABI:  1.2 Left ABI:  1.1 Right Lower Extremity:  Mildly abnormal biphasic arterial waveforms. Left Lower Extremity:  Mildly abnormal biphasic arterial waveforms. 1.0-1.4 Normal IMPRESSION: 1. Mildly abnormal arterial waveforms bilaterally suggests at least an element of underlying peripheral arterial disease. 2. However, the bilateral ankle-brachial indices remain within normal limits and are symmetric. Electronically Signed   By: Wilkie Lent M.D.   On: 11/02/2023 08:09  US  RENAL Result Date: 11/01/2023 CLINICAL DATA:  [id number] Acute kidney failure (HCC) [id number] EXAM: RENAL / URINARY TRACT ULTRASOUND COMPLETE COMPARISON:  CT abdomen pelvis 07/06/2021 FINDINGS: Right Kidney: Renal measurements: 10.1 x 4.9 x 5 cm = volume: 130.2 mL. Echogenicity within normal limits. No mass or hydronephrosis visualized. Left Kidney: Renal measurements: 10.7 x 5.4 x 4.6 cm = volume: 140.3 mL. Echogenicity within normal limits. Left nephrolithiasis measuring up to 5 mm. No mass or hydronephrosis visualized. Bladder: Foley catheter inflated balloon terminating within the urinary  bladder lumen. Urinary bladder decompressed with urinary bladder wall thickening. Other: Increased hepatic parenchyma echogenicity. At least small volume simple free fluid ascites. IMPRESSION: 1. Urinary bladder wall thickening. May be due to under distension in the setting of a Foley. Correlate with urinalysis for infection. 2. Nonobstructive left nephrolithiasis. 3. Hepatic steatosis. 4. At least small volume simple free fluid ascites. Electronically Signed   By: Morgane  Naveau M.D.   On: 11/01/2023 17:25   DG Abd 1 View Result Date: 11/01/2023 CLINICAL DATA:  Nasogastric tube placement. EXAM: ABDOMEN - 1 VIEW COMPARISON:  Abdomen and pelvis CT dated 07/06/2021. FINDINGS: Normal bowel-gas pattern. Nasogastric tube tip in the proximal to mid stomach and side hole in the proximal stomach. Mild scoliosis and mild lumbar spine degenerative changes. Extensive atheromatous arterial calcifications without visible aneurysm. IMPRESSION: Nasogastric tube tip in the proximal to mid stomach. Electronically Signed   By: Elspeth Bathe M.D.   On: 11/01/2023 12:50   US  Venous Img Lower Bilateral (DVT) Result Date: 11/01/2023 CLINICAL DATA:  Bilateral lower extremity edema EXAM: BILATERAL LOWER EXTREMITY VENOUS DOPPLER ULTRASOUND TECHNIQUE: Gray-scale sonography with graded compression, as well as color Doppler and duplex ultrasound were performed to evaluate the lower extremity deep venous systems from the level of the common femoral vein and including the common femoral, femoral, profunda femoral, popliteal and calf veins including the posterior tibial, peroneal and gastrocnemius veins when visible. The superficial great saphenous vein was also interrogated. Spectral Doppler was utilized to evaluate flow at rest and with distal augmentation maneuvers in the common femoral, femoral and popliteal veins. COMPARISON:  None Available. FINDINGS: RIGHT LOWER EXTREMITY Common Femoral Vein: No evidence of thrombus. Normal  compressibility, respiratory phasicity and response to augmentation. Saphenofemoral Junction: No evidence of thrombus. Normal compressibility and flow on color Doppler imaging. Profunda Femoral Vein: No evidence of thrombus. Normal compressibility and flow on color Doppler imaging. Femoral Vein: No evidence of thrombus. Normal compressibility, respiratory phasicity and response to augmentation. Popliteal Vein: No evidence of thrombus. Normal compressibility, respiratory phasicity and response to augmentation. Calf Veins: No evidence of thrombus. Normal compressibility and flow on color Doppler imaging. Superficial Great Saphenous Vein: No evidence of thrombus. Normal compressibility. Venous Reflux:  None. Other Findings:  Extensive subcutaneous edema. LEFT LOWER EXTREMITY Common Femoral Vein: Unable to visualize due to obscuration from overlying dressing. Saphenofemoral Junction: Unable to visualize due to obscuration from overlying dressing. Profunda Femoral Vein: No evidence of thrombus. Normal compressibility and flow on color Doppler imaging. Femoral Vein: No evidence of thrombus. Normal compressibility, respiratory phasicity and response to augmentation. Popliteal Vein: No evidence of thrombus. Normal compressibility, respiratory phasicity and response to augmentation. Calf Veins: No evidence of thrombus. Normal compressibility and flow on color Doppler imaging. Superficial Great Saphenous Vein: No evidence of thrombus. Normal compressibility. Venous Reflux:  None. Other Findings:  Extensive subcutaneous edema. IMPRESSION: 1. No evidence of deep venous thrombosis in either lower extremity. Please note that the left common femoral  vein was not well seen due to overlying bandaging material. 2. Extensive subcutaneous edema bilaterally. Differential considerations include volume overload versus cellulitis. 3. Enlarged right superficial inguinal lymph node measuring up to 1.2 cm in short axis. Given known history of  right lower extremity infection, reactive lymphadenopathy is strongly favored. Electronically Signed   By: Wilkie Lent M.D.   On: 11/01/2023 08:54   CT TIBIA FIBULA RIGHT WO CONTRAST Result Date: 10/31/2023 CLINICAL DATA:  Swelling.  Osteomyelitis suspected. EXAM: CT OF THE LOWER RIGHT EXTREMITY WITHOUT CONTRAST TECHNIQUE: Multidetector CT imaging of the right lower extremity was performed according to the standard protocol. RADIATION DOSE REDUCTION: This exam was performed according to the departmental dose-optimization program which includes automated exposure control, adjustment of the mA and/or kV according to patient size and/or use of iterative reconstruction technique. COMPARISON:  None Available. FINDINGS: Bones/Joint/Cartilage No acute bony abnormality. Specifically, no fracture, subluxation, or dislocation. No bone destruction to suggest osteomyelitis. Ligaments Suboptimally assessed by CT. Muscles and Tendons Negative Soft tissues Stranding throughout the subcutaneous soft tissues in the left lower extremity. IMPRESSION: Diffuse soft tissue swelling. No acute bony abnormality. No evidence of osteomyelitis. Electronically Signed   By: Franky Crease M.D.   On: 10/31/2023 21:21   CT FOOT RIGHT WO CONTRAST Result Date: 10/31/2023 CLINICAL DATA:  Swelling.  Osteomyelitis suspected. EXAM: CT OF THE LOWER RIGHT EXTREMITY WITHOUT CONTRAST TECHNIQUE: Multidetector CT imaging of the right lower extremity was performed according to the standard protocol. RADIATION DOSE REDUCTION: This exam was performed according to the departmental dose-optimization program which includes automated exposure control, adjustment of the mA and/or kV according to patient size and/or use of iterative reconstruction technique. COMPARISON:  None Available. FINDINGS: Bones/Joint/Cartilage No acute bony abnormality. Specifically, no fracture, subluxation, or dislocation. No bone destruction to suggest osteomyelitis. Ligaments  Suboptimally assessed by CT. Muscles and Tendons Negative Soft tissues Stranding throughout the subcutaneous soft tissues in the left lower extremity. IMPRESSION: Diffuse soft tissue swelling. No acute bony abnormality. No evidence of osteomyelitis. Electronically Signed   By: Franky Crease M.D.   On: 10/31/2023 21:21   CT TIBIA FIBULA LEFT WO CONTRAST Result Date: 10/31/2023 CLINICAL DATA:  Swelling.  Osteomyelitis suspected. EXAM: CT OF THE LOWER LEFT EXTREMITY WITHOUT CONTRAST TECHNIQUE: Multidetector CT imaging of the lower left extremity was performed according to the standard protocol. RADIATION DOSE REDUCTION: This exam was performed according to the departmental dose-optimization program which includes automated exposure control, adjustment of the mA and/or kV according to patient size and/or use of iterative reconstruction technique. COMPARISON:  None Available. FINDINGS: Bones/Joint/Cartilage No acute bony abnormality. Specifically, no fracture, subluxation, or dislocation. No bone destruction to suggest osteomyelitis. Ligaments Suboptimally assessed by CT. Muscles and Tendons Negative Soft tissues Stranding throughout the subcutaneous soft tissues in the left lower extremity. IMPRESSION: Diffuse soft tissue swelling. No acute bony abnormality. No evidence of osteomyelitis. Electronically Signed   By: Franky Crease M.D.   On: 10/31/2023 21:20   CT FOOT LEFT WO CONTRAST Result Date: 10/31/2023 CLINICAL DATA:  Swelling.  Osteomyelitis suspected. EXAM: CT OF THE LOWER LEFT EXTREMITY WITHOUT CONTRAST TECHNIQUE: Multidetector CT imaging of the lower left extremity was performed according to the standard protocol. RADIATION DOSE REDUCTION: This exam was performed according to the departmental dose-optimization program which includes automated exposure control, adjustment of the mA and/or kV according to patient size and/or use of iterative reconstruction technique. COMPARISON:  None Available. FINDINGS:  Bones/Joint/Cartilage No acute bony abnormality. Specifically, no fracture, subluxation,  or dislocation. No bone destruction to suggest osteomyelitis. Ligaments Suboptimally assessed by CT. Muscles and Tendons Negative Soft tissues Stranding throughout the subcutaneous soft tissues in the left lower extremity. IMPRESSION: Diffuse soft tissue swelling. No acute bony abnormality. No evidence of osteomyelitis. Electronically Signed   By: Franky Crease M.D.   On: 10/31/2023 21:20   DG Chest Portable 1 View Result Date: 10/30/2023 CLINICAL DATA:  Short of breath, lower extremity swelling, weakness EXAM: PORTABLE CHEST 1 VIEW COMPARISON:  09/22/2023 FINDINGS: Single frontal view of the chest demonstrates an unremarkable cardiac silhouette. Stable emphysema and background scarring. No acute airspace disease, effusion, or pneumothorax. No acute bony abnormalities. IMPRESSION: 1. Emphysema and background scarring unchanged since prior study. No acute airspace disease. Electronically Signed   By: Ozell Daring M.D.   On: 10/30/2023 20:36    Microbiology: Results for orders placed or performed during the hospital encounter of 10/30/23  Blood culture (routine x 2)     Status: Abnormal   Collection Time: 10/30/23  7:28 PM   Specimen: BLOOD  Result Value Ref Range Status   Specimen Description   Final    BLOOD LEFT ANTECUBITAL Performed at [facility], [address], [place], [place] [address]    Special Requests   Final    BLOOD Blood Culture adequate volume Performed at [facility], [address] [place], [place] [address]    Culture  Setup Time   Final    GRAM NEGATIVE RODS IN BOTH AEROBIC AND ANAEROBIC BOTTLES CRITICAL RESULT CALLED TO, READ BACK BY AND VERIFIED WITHBETHA ESTILL LUTES PHARMD [id number] 10/31/23 HNM GRAM STAIN REVIEWED-AGREE WITH RESULT DRT Performed at [facility], [address], [place], [place] [address]    Culture SERRATIA MARCESCENS (A)  Final   Report  Status 11/02/2023 FINAL  Final   Organism ID, Bacteria SERRATIA MARCESCENS  Final      Susceptibility   Serratia marcescens - MIC*    CEFEPIME  <=0.12 SENSITIVE Sensitive     ERTAPENEM <=0.12 SENSITIVE Sensitive     CEFTRIAXONE  <=0.25 SENSITIVE Sensitive     CIPROFLOXACIN  <=0.06 SENSITIVE Sensitive     GENTAMICIN <=1 SENSITIVE Sensitive     MEROPENEM  <=0.25 SENSITIVE Sensitive     TRIMETH /SULFA  <=20 SENSITIVE Sensitive     * SERRATIA MARCESCENS  Blood Culture ID Panel (Reflexed)     Status: Abnormal   Collection Time: 10/30/23  7:28 PM  Result Value Ref Range Status   Enterococcus faecalis NOT DETECTED NOT DETECTED Final   Enterococcus Faecium NOT DETECTED NOT DETECTED Final   Listeria monocytogenes NOT DETECTED NOT DETECTED Final   Staphylococcus species NOT DETECTED NOT DETECTED Final   Staphylococcus aureus (BCID) NOT DETECTED NOT DETECTED Final   Staphylococcus epidermidis NOT DETECTED NOT DETECTED Final   Staphylococcus lugdunensis NOT DETECTED NOT DETECTED Final   Streptococcus species NOT DETECTED NOT DETECTED Final   Streptococcus agalactiae NOT DETECTED NOT DETECTED Final   Streptococcus pneumoniae NOT DETECTED NOT DETECTED Final   Streptococcus pyogenes NOT DETECTED NOT DETECTED Final   A.calcoaceticus-baumannii NOT DETECTED NOT DETECTED Final   Bacteroides fragilis NOT DETECTED NOT DETECTED Final   Enterobacterales DETECTED (A) NOT DETECTED Final    Comment: Enterobacterales represent a large order of gram negative bacteria, not a single organism. CRITICAL RESULT CALLED TO, READ BACK BY AND VERIFIED WITH: [facility] HALLAJI PHARMD [id number] 10/31/23 HNM    Enterobacter cloacae complex NOT DETECTED NOT DETECTED Final   Escherichia coli NOT DETECTED NOT  DETECTED Final   Klebsiella aerogenes NOT DETECTED NOT DETECTED Final   Klebsiella oxytoca NOT DETECTED NOT DETECTED Final   Klebsiella pneumoniae NOT DETECTED NOT DETECTED Final   Proteus species NOT DETECTED NOT DETECTED Final    Salmonella species NOT DETECTED NOT DETECTED Final   Serratia marcescens DETECTED (A) NOT DETECTED Final    Comment: CRITICAL RESULT CALLED TO, READ BACK BY AND VERIFIED WITH: ESTILL LUTES PHARMD [id number] 10/31/23 HNM    Haemophilus influenzae NOT DETECTED NOT DETECTED Final   Neisseria meningitidis NOT DETECTED NOT DETECTED Final   Pseudomonas aeruginosa NOT DETECTED NOT DETECTED Final   Stenotrophomonas maltophilia NOT DETECTED NOT DETECTED Final   Candida albicans NOT DETECTED NOT DETECTED Final   Candida auris NOT DETECTED NOT DETECTED Final   Candida glabrata NOT DETECTED NOT DETECTED Final   Candida krusei NOT DETECTED NOT DETECTED Final   Candida parapsilosis NOT DETECTED NOT DETECTED Final   Candida tropicalis NOT DETECTED NOT DETECTED Final   Cryptococcus neoformans/gattii NOT DETECTED NOT DETECTED Final   CTX-M ESBL NOT DETECTED NOT DETECTED Final   Carbapenem resistance IMP NOT DETECTED NOT DETECTED Final   Carbapenem resistance KPC NOT DETECTED NOT DETECTED Final   Carbapenem resistance NDM NOT DETECTED NOT DETECTED Final   Carbapenem resist OXA 48 LIKE NOT DETECTED NOT DETECTED Final   Carbapenem resistance VIM NOT DETECTED NOT DETECTED Final    Comment: Performed at [facility], [address] [place], [place] [address]  Blood culture (routine x 2)     Status: Abnormal   Collection Time: 10/30/23  7:55 PM   Specimen: BLOOD  Result Value Ref Range Status   Specimen Description   Final    BLOOD BLOOD RIGHT ARM Performed at [facility], [address] [place], [place] [address]    Special Requests   Final    BOTTLES DRAWN AEROBIC AND ANAEROBIC Blood Culture results may not be optimal due to an inadequate volume of blood received in culture bottles Performed at [facility], [address] [place], [place] [address]    Culture  Setup Time   Final    GRAM NEGATIVE RODS IN BOTH AEROBIC AND ANAEROBIC BOTTLES CRITICAL VALUE NOTED.  VALUE IS  CONSISTENT WITH PREVIOUSLY REPORTED AND CALLED VALUE. GRAM STAIN REVIEWED-AGREE WITH RESULT DRT    Culture (A)  Final    SERRATIA MARCESCENS SUSCEPTIBILITIES PERFORMED ON PREVIOUS CULTURE WITHIN THE LAST 5 DAYS. Performed at [facility], [address] [place], [place] [address]    Report Status 11/02/2023 FINAL  Final  Resp panel by RT-PCR (RSV, Flu A&B, Covid) Anterior Nasal Swab     Status: None   Collection Time: 10/30/23  8:01 PM   Specimen: Anterior Nasal Swab  Result Value Ref Range Status   SARS Coronavirus 2 by RT PCR NEGATIVE NEGATIVE Final    Comment: (NOTE) SARS-CoV-2 target nucleic acids are NOT DETECTED.  The SARS-CoV-2 RNA is generally detectable in upper respiratory specimens during the acute phase of infection. The lowest concentration of SARS-CoV-2 viral copies this assay can detect is 138 copies/mL. A negative result does not preclude SARS-Cov-2 infection and should not be used as the sole basis for treatment or other patient management decisions. A negative result may occur with  improper specimen collection/handling, submission of specimen other than nasopharyngeal swab, presence of viral mutation(s) within the areas targeted by this assay, and inadequate number of viral copies(<138 copies/mL). A negative result must be combined with  clinical observations, patient history, and epidemiological information. The expected result is Negative.  Fact Sheet for Patients:  BloggerCourse.com  Fact Sheet for Healthcare Providers:  SeriousBroker.it  This test is no t yet approved or cleared by the [place]  FDA and  has been authorized for detection and/or diagnosis of SARS-CoV-2 by FDA under an Emergency Use Authorization (EUA). This EUA will remain  in effect (meaning this test can be used) for the duration of the COVID-19 declaration under Section 564(b)(1) of the Act, 21 U.S.C.section 360bbb-3(b)(1),  unless the authorization is terminated  or revoked sooner.       Influenza A by PCR NEGATIVE NEGATIVE Final   Influenza B by PCR NEGATIVE NEGATIVE Final    Comment: (NOTE) The Xpert Xpress SARS-CoV-2/FLU/RSV plus assay is intended as an aid in the diagnosis of influenza from Nasopharyngeal swab specimens and should not be used as a sole basis for treatment. Nasal washings and aspirates are unacceptable for Xpert Xpress SARS-CoV-2/FLU/RSV testing.  Fact Sheet for Patients: BloggerCourse.com  Fact Sheet for Healthcare Providers: SeriousBroker.it  This test is not yet approved or cleared by the [place]  FDA and has been authorized for detection and/or diagnosis of SARS-CoV-2 by FDA under an Emergency Use Authorization (EUA). This EUA will remain in effect (meaning this test can be used) for the duration of the COVID-19 declaration under Section 564(b)(1) of the Act, 21 U.S.C. section 360bbb-3(b)(1), unless the authorization is terminated or revoked.     Resp Syncytial Virus by PCR NEGATIVE NEGATIVE Final    Comment: (NOTE) Fact Sheet for Patients: BloggerCourse.com  Fact Sheet for Healthcare Providers: SeriousBroker.it  This test is not yet approved or cleared by the [place]  FDA and has been authorized for detection and/or diagnosis of SARS-CoV-2 by FDA under an Emergency Use Authorization (EUA). This EUA will remain in effect (meaning this test can be used) for the duration of the COVID-19 declaration under Section 564(b)(1) of the Act, 21 U.S.C. section 360bbb-3(b)(1), unless the authorization is terminated or revoked.  Performed at [facility] [address] [place], [place] [address]   MRSA Next Gen by PCR, Nasal     Status: None   Collection Time: 10/31/23  2:05 PM   Specimen: Nasal Mucosa; Nasal Swab  Result Value Ref Range Status   MRSA by  PCR Next Gen NOT DETECTED NOT DETECTED Final    Comment: (NOTE) The GeneXpert MRSA Assay (FDA approved for NASAL specimens only), is one component of a comprehensive MRSA colonization surveillance program. It is not intended to diagnose MRSA infection nor to guide or monitor treatment for MRSA infections. Test performance is not FDA approved in patients less than 76 years old. Performed at [facility], [address] [place], [place] [address]   Body fluid culture w Gram Stain     Status: None   Collection Time: 11/12/23 11:10 AM   Specimen: Lung, Right Lower Lobe; Pleural Fluid  Result Value Ref Range Status   Specimen Description   Final    PLEURAL Performed at [facility], [address] [place], [place] [address]    Special Requests   Final    R LL Performed at [facility], [address] [place], [place] [address]    Gram Stain NO WBC SEEN NO ORGANISMS SEEN   Final   Culture   Final    NO GROWTH 3 DAYS Performed at [facility], [address], [place], [place] [address]  Report Status 11/15/2023 FINAL  Final  Aerobic/Anaerobic Culture w Gram Stain (surgical/deep wound)     Status: None (Preliminary result)   Collection Time: 11/12/23  1:31 PM   Specimen: Wound  Result Value Ref Range Status   Specimen Description   Final    WOUND Performed at [facility], [address], [place], [place] [address]    Special Requests   Final    RIGHT FOOT Performed at [facility], [address] [place], [place] [address]    Gram Stain   Final    NO WBC SEEN NO ORGANISMS SEEN Performed at [facility], [address], [place], [place] [address]    Culture   Final    RARE SERRATIA MARCESCENS CULTURE REINCUBATED FOR BETTER GROWTH NO ANAEROBES ISOLATED; CULTURE IN PROGRESS FOR 5 DAYS    Report Status PENDING  Incomplete   Organism ID, Bacteria SERRATIA MARCESCENS  Final      Susceptibility   Serratia  marcescens - MIC*    CEFEPIME  <=0.12 SENSITIVE Sensitive     ERTAPENEM <=0.12 SENSITIVE Sensitive     CEFTRIAXONE  <=0.25 SENSITIVE Sensitive     CIPROFLOXACIN  <=0.06 SENSITIVE Sensitive     GENTAMICIN <=1 SENSITIVE Sensitive     MEROPENEM  1 SENSITIVE Sensitive     TRIMETH /SULFA  <=20 SENSITIVE Sensitive     * RARE SERRATIA MARCESCENS  Aerobic/Anaerobic Culture w Gram Stain (surgical/deep wound)     Status: None (Preliminary result)   Collection Time: 11/13/23  8:20 AM   Specimen: Wound; Abscess  Result Value Ref Range Status   Specimen Description   Final    WOUND Performed at [facility], [address] [place], [place] [address]    Special Requests   Final    RF Performed at [facility], [address] [place], [place] [address]    Gram Stain   Final    NO WBC SEEN NO ORGANISMS SEEN Performed at [facility], [address], [place], [place] [address]    Culture   Final    RARE SERRATIA MARCESCENS NO ANAEROBES ISOLATED; CULTURE IN PROGRESS FOR 5 DAYS    Report Status PENDING  Incomplete  Aerobic/Anaerobic Culture w Gram Stain (surgical/deep wound)     Status: None (Preliminary result)   Collection Time: 11/14/23 12:31 PM   Specimen: Foot, Right; Tissue  Result Value Ref Range Status   Specimen Description   Final    TISSUE Performed at [facility], [address] [place], [place] [address]    Special Requests   Final    RIGHT FOOT ABSCESS Performed at [facility], [address] [place], [place] [address]    Gram Stain   Final    NO WBC SEEN NO ORGANISMS SEEN Performed at [facility], [address], [place], [place] [address]    Culture   Final    MODERATE GRAM NEGATIVE RODS IDENTIFICATION TO FOLLOW NO ANAEROBES ISOLATED; CULTURE IN PROGRESS FOR 5 DAYS    Report Status PENDING  Incomplete  Aerobic/Anaerobic Culture w Gram Stain (surgical/deep wound)     Status: None (Preliminary result)   Collection  Time: 11/14/23  1:02 PM   Specimen: Bone; Tissue  Result Value Ref Range Status   Specimen Description   Final    BONE Performed at [facility], [address], [place], [place] [address]    Special Requests   Final    SECOND  TOE LEFT FOOT BONE CULTURE Performed at [facility], [address] [place], [place] [address]    Gram Stain   Final    NO WBC SEEN NO ORGANISMS SEEN Performed at [facility], [address], [place], [place] [address]    Culture   Final    CULTURE REINCUBATED FOR BETTER GROWTH NO ANAEROBES ISOLATED; CULTURE IN PROGRESS FOR 5 DAYS    Report Status PENDING  Incomplete    Labs: CBC: Recent Labs  Lab 11/11/23 0504 11/12/23 0953 11/13/23 0530 11/14/23 0256 11/16/23 0351  WBC 9.4 9.8 7.6 6.0  --   HGB 8.0* 9.4* 7.5* 7.5* 7.9*  HCT 24.4* 28.8* 23.0* 23.0*  --   MCV 97.2 97.3 97.9 99.1  --   PLT 477* 480* 445* 414*  --    Basic Metabolic Panel: Recent Labs  Lab 11/11/23 0504 11/12/23 0509 11/13/23 0530 11/14/23 0256 11/15/23 0501 11/16/23 0351 11/17/23 0940  NA 135 137 134* 137 136 135 139  K 3.9 3.4* 3.4* 3.4* 3.6 3.8 3.8  CL 102 104 104 104 103 104 101  CO2 26 23 24 25 24 25 25   GLUCOSE 97 87 88 116* 78 97 121*  BUN 14 14 14 14 14 15 15   CREATININE 1.08* 1.24* 0.94 1.11* 1.10* 1.09* 1.19*  CALCIUM  7.3* 7.4* 7.2* 7.3* 7.5* 7.7* 7.9*  MG 2.2 1.8  --  1.6* 1.9 2.1  --   PHOS  --   --   --   --  3.0  --   --    Liver Function Tests: Recent Labs  Lab 11/13/23 0530  AST 25  ALT 5  ALKPHOS 88  BILITOT 0.4  PROT 4.8*  ALBUMIN  1.7*   CBG: Recent Labs  Lab 11/15/23 1637 11/16/23 0711 11/16/23 1138 11/16/23 1642 11/17/23 0810  GLUCAP 89 91 242* 133* 90    Discharge time spent: greater than 30 minutes.  Signed: Charlie Patterson, MD [facility] 11/17/2023

## 2023-11-17 NOTE — TOC Transition Note (Signed)
 Transition of Care [facility]) - Discharge Note   Patient Details  Name: Tricia [name] MRN: [id number] Date of Birth: Jun 25, 1960  Transition of Care [facility] CM/SW Contact:  Marinda Cooks, RN Phone Number: 11/17/2023, 1:21 PM   Clinical Narrative:    This CM updated by covering MD pt medically cleared to dc today and has active DC order . This CM spoke with Admission liaison  Ricky @ [facility] and confirmed  pt can be received today into bed E-11. Bedside provided number to call report and bed number pt will go into.  DC transportation confirmed for pt with [facility] . Medical team updated . No additional DC needs requested by medical team or identified by CM at this time .     Final next level of care: Skilled Nursing Facility Barriers to Discharge: No Barriers Identified   Patient Goals and CMS Choice Patient states their goals for this hospitalization and ongoing recovery are:: To recieve Rehab CMS Medicare.gov Compare Post Acute Care list provided to:: Patient Choice offered to / list presented to : Patient      Discharge Placement   Existing PASRR number confirmed : 11/17/23          Patient chooses bed at: Other - please specify in the comment section below: ([facility]) Patient to be transferred to facility by: Ambulance Name of family member notified: Pt Patient and family notified of of transfer: 11/17/23  Discharge Plan and Services Additional resources added to the After Visit Summary for                                       Social Drivers of Health (SDOH) Interventions SDOH Screenings   Food Insecurity: Patient Unable To Answer (10/31/2023)  Housing: Unknown (10/31/2023)  Transportation Needs: Patient Unable To Answer (10/31/2023)  Utilities: Patient Unable To Answer (10/31/2023)  Alcohol Screen: Low Risk  (03/07/2023)  Depression (PHQ2-9): High Risk (05/01/2023)  Financial Resource Strain: Patient Declined (06/18/2023)   Received from [facility]  Physical Activity: Sufficiently Active (03/07/2023)  Social Connections: Socially Isolated (08/29/2023)  Stress: No Stress Concern Present (03/07/2023)  Tobacco Use: High Risk (11/14/2023)  Health Literacy: Adequate Health Literacy (03/07/2023)     Readmission Risk Interventions    11/02/2023    1:21 PM  Readmission Risk Prevention Plan  Transportation Screening Complete  Medication Review (RN Care Manager) Complete  PCP or Specialist appointment within 3-5 days of discharge Complete  SW Recovery Care/Counseling Consult Complete  Palliative Care Screening Not Applicable  Skilled Nursing Facility Not Applicable

## 2023-11-18 LAB — BPAM RBC
Blood Product Expiration Date: 202510192359
Blood Product Expiration Date: 202510192359
Unit Type and Rh: 6200
Unit Type and Rh: 6200

## 2023-11-18 LAB — TYPE AND SCREEN
ABO/RH(D): A POS
Antibody Screen: POSITIVE
DAT, IgG: NEGATIVE
DAT, complement: NEGATIVE
Unit division: 0
Unit division: 0

## 2023-11-18 LAB — AEROBIC/ANAEROBIC CULTURE W GRAM STAIN (SURGICAL/DEEP WOUND)
Gram Stain: NONE SEEN
Gram Stain: NONE SEEN

## 2023-11-18 LAB — SURGICAL PATHOLOGY

## 2023-11-19 LAB — AEROBIC/ANAEROBIC CULTURE W GRAM STAIN (SURGICAL/DEEP WOUND)
Gram Stain: NONE SEEN
Gram Stain: NONE SEEN

## 2023-12-27 ENCOUNTER — Telehealth: Payer: Self-pay

## 2023-12-27 NOTE — Telephone Encounter (Signed)
 Copied from CRM #[id number]. 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 Patient states she has no way in for an appointment. Patient states she needs Motorized wheelchair Emergency Planning/management Officer for Programme [employer], Broadcasting/film/video for bed(rolled out of bed 3x's) Ramp for back door  [place] [phone]

## 2023-12-27 NOTE — Telephone Encounter (Signed)
 Appt sch for Dec 2

## 2023-12-27 NOTE — Telephone Encounter (Signed)
 Insurance requires face to face appt

## 2024-01-20 ENCOUNTER — Inpatient Hospital Stay
Admission: EM | Admit: 2024-01-20 | Discharge: 2024-01-26 | DRG: 871 | Disposition: A | Discharge status: Home Health | Attending: Hospitalist | Admitting: Hospitalist

## 2024-01-20 ENCOUNTER — Encounter: Payer: Self-pay | Admitting: Emergency Medicine

## 2024-01-20 ENCOUNTER — Emergency Department

## 2024-01-20 ENCOUNTER — Other Ambulatory Visit: Payer: Self-pay

## 2024-01-20 ENCOUNTER — Inpatient Hospital Stay

## 2024-01-20 DIAGNOSIS — N189 Chronic kidney disease, unspecified: Secondary | ICD-10-CM | POA: Diagnosis not present

## 2024-01-20 DIAGNOSIS — I5189 Other ill-defined heart diseases: Secondary | ICD-10-CM

## 2024-01-20 DIAGNOSIS — E876 Hypokalemia: Secondary | ICD-10-CM | POA: Diagnosis present

## 2024-01-20 DIAGNOSIS — E119 Type 2 diabetes mellitus without complications: Secondary | ICD-10-CM | POA: Diagnosis not present

## 2024-01-20 DIAGNOSIS — I2489 Other forms of acute ischemic heart disease: Secondary | ICD-10-CM | POA: Diagnosis present

## 2024-01-20 DIAGNOSIS — F17210 Nicotine dependence, cigarettes, uncomplicated: Secondary | ICD-10-CM | POA: Diagnosis present

## 2024-01-20 DIAGNOSIS — G20A1 Parkinson's disease without dyskinesia, without mention of fluctuations: Secondary | ICD-10-CM | POA: Diagnosis present

## 2024-01-20 DIAGNOSIS — J9601 Acute respiratory failure with hypoxia: Secondary | ICD-10-CM | POA: Diagnosis not present

## 2024-01-20 DIAGNOSIS — M4802 Spinal stenosis, cervical region: Secondary | ICD-10-CM | POA: Diagnosis present

## 2024-01-20 DIAGNOSIS — Z833 Family history of diabetes mellitus: Secondary | ICD-10-CM | POA: Diagnosis not present

## 2024-01-20 DIAGNOSIS — Z800 Family history of malignant neoplasm of digestive organs: Secondary | ICD-10-CM

## 2024-01-20 DIAGNOSIS — Z7951 Long term (current) use of inhaled steroids: Secondary | ICD-10-CM

## 2024-01-20 DIAGNOSIS — N1831 Chronic kidney disease, stage 3a: Secondary | ICD-10-CM | POA: Diagnosis present

## 2024-01-20 DIAGNOSIS — K219 Gastro-esophageal reflux disease without esophagitis: Secondary | ICD-10-CM | POA: Diagnosis present

## 2024-01-20 DIAGNOSIS — R7989 Other specified abnormal findings of blood chemistry: Secondary | ICD-10-CM | POA: Diagnosis present

## 2024-01-20 DIAGNOSIS — Z8049 Family history of malignant neoplasm of other genital organs: Secondary | ICD-10-CM

## 2024-01-20 DIAGNOSIS — Z8249 Family history of ischemic heart disease and other diseases of the circulatory system: Secondary | ICD-10-CM | POA: Diagnosis not present

## 2024-01-20 DIAGNOSIS — Z825 Family history of asthma and other chronic lower respiratory diseases: Secondary | ICD-10-CM

## 2024-01-20 DIAGNOSIS — G250 Essential tremor: Secondary | ICD-10-CM | POA: Diagnosis present

## 2024-01-20 DIAGNOSIS — D631 Anemia in chronic kidney disease: Secondary | ICD-10-CM | POA: Diagnosis present

## 2024-01-20 DIAGNOSIS — H9192 Unspecified hearing loss, left ear: Secondary | ICD-10-CM | POA: Diagnosis present

## 2024-01-20 DIAGNOSIS — Z818 Family history of other mental and behavioral disorders: Secondary | ICD-10-CM

## 2024-01-20 DIAGNOSIS — L03115 Cellulitis of right lower limb: Secondary | ICD-10-CM | POA: Diagnosis present

## 2024-01-20 DIAGNOSIS — A419 Sepsis, unspecified organism: Secondary | ICD-10-CM | POA: Diagnosis present

## 2024-01-20 DIAGNOSIS — Z801 Family history of malignant neoplasm of trachea, bronchus and lung: Secondary | ICD-10-CM

## 2024-01-20 DIAGNOSIS — I5030 Unspecified diastolic (congestive) heart failure: Secondary | ICD-10-CM | POA: Diagnosis present

## 2024-01-20 DIAGNOSIS — T402X5A Adverse effect of other opioids, initial encounter: Secondary | ICD-10-CM | POA: Diagnosis present

## 2024-01-20 DIAGNOSIS — I130 Hypertensive heart and chronic kidney disease with heart failure and stage 1 through stage 4 chronic kidney disease, or unspecified chronic kidney disease: Secondary | ICD-10-CM | POA: Diagnosis present

## 2024-01-20 DIAGNOSIS — L0390 Cellulitis, unspecified: Secondary | ICD-10-CM | POA: Diagnosis not present

## 2024-01-20 DIAGNOSIS — J4489 Other specified chronic obstructive pulmonary disease: Secondary | ICD-10-CM | POA: Diagnosis present

## 2024-01-20 DIAGNOSIS — N179 Acute kidney failure, unspecified: Secondary | ICD-10-CM | POA: Diagnosis present

## 2024-01-20 DIAGNOSIS — F419 Anxiety disorder, unspecified: Secondary | ICD-10-CM | POA: Diagnosis present

## 2024-01-20 DIAGNOSIS — R338 Other retention of urine: Secondary | ICD-10-CM

## 2024-01-20 DIAGNOSIS — R339 Retention of urine, unspecified: Secondary | ICD-10-CM | POA: Diagnosis present

## 2024-01-20 DIAGNOSIS — R4182 Altered mental status, unspecified: Secondary | ICD-10-CM | POA: Diagnosis present

## 2024-01-20 DIAGNOSIS — R319 Hematuria, unspecified: Secondary | ICD-10-CM | POA: Diagnosis present

## 2024-01-20 DIAGNOSIS — Z1152 Encounter for screening for COVID-19: Secondary | ICD-10-CM | POA: Diagnosis not present

## 2024-01-20 DIAGNOSIS — J449 Chronic obstructive pulmonary disease, unspecified: Secondary | ICD-10-CM | POA: Diagnosis not present

## 2024-01-20 DIAGNOSIS — E785 Hyperlipidemia, unspecified: Secondary | ICD-10-CM | POA: Diagnosis present

## 2024-01-20 DIAGNOSIS — Z823 Family history of stroke: Secondary | ICD-10-CM

## 2024-01-20 DIAGNOSIS — E1122 Type 2 diabetes mellitus with diabetic chronic kidney disease: Secondary | ICD-10-CM | POA: Diagnosis present

## 2024-01-20 DIAGNOSIS — Z820 Family history of epilepsy and other diseases of the nervous system: Secondary | ICD-10-CM

## 2024-01-20 DIAGNOSIS — N1330 Unspecified hydronephrosis: Secondary | ICD-10-CM | POA: Diagnosis present

## 2024-01-20 DIAGNOSIS — G929 Unspecified toxic encephalopathy: Secondary | ICD-10-CM | POA: Diagnosis present

## 2024-01-20 DIAGNOSIS — Z880 Allergy status to penicillin: Secondary | ICD-10-CM

## 2024-01-20 DIAGNOSIS — G629 Polyneuropathy, unspecified: Secondary | ICD-10-CM | POA: Diagnosis present

## 2024-01-20 DIAGNOSIS — Z89422 Acquired absence of other left toe(s): Secondary | ICD-10-CM

## 2024-01-20 DIAGNOSIS — Z79899 Other long term (current) drug therapy: Secondary | ICD-10-CM

## 2024-01-20 DIAGNOSIS — N183 Chronic kidney disease, stage 3 unspecified: Secondary | ICD-10-CM | POA: Diagnosis present

## 2024-01-20 LAB — CBC WITH DIFFERENTIAL/PLATELET
Abs Immature Granulocytes: 0.03 K/uL (ref 0.00–0.07)
Basophils Absolute: 0 K/uL (ref 0.0–0.1)
Basophils Relative: 0 %
Eosinophils Absolute: 0 K/uL (ref 0.0–0.5)
Eosinophils Relative: 0 %
HCT: 26.5 % — ABNORMAL LOW (ref 36.0–46.0)
Hemoglobin: 8.4 g/dL — ABNORMAL LOW (ref 12.0–15.0)
Immature Granulocytes: 1 %
Lymphocytes Relative: 23 %
Lymphs Abs: 1.3 K/uL (ref 0.7–4.0)
MCH: 30.7 pg (ref 26.0–34.0)
MCHC: 31.7 g/dL (ref 30.0–36.0)
MCV: 96.7 fL (ref 80.0–100.0)
Monocytes Absolute: 0.4 K/uL (ref 0.1–1.0)
Monocytes Relative: 8 %
Neutro Abs: 3.9 K/uL (ref 1.7–7.7)
Neutrophils Relative %: 68 %
Platelets: 296 K/uL (ref 150–400)
RBC: 2.74 MIL/uL — ABNORMAL LOW (ref 3.87–5.11)
RDW: 16.2 % — ABNORMAL HIGH (ref 11.5–15.5)
Smear Review: NORMAL
WBC: 5.8 K/uL (ref 4.0–10.5)
nRBC: 0 % (ref 0.0–0.2)

## 2024-01-20 LAB — COMPREHENSIVE METABOLIC PANEL WITH GFR
ALT: 16 U/L (ref 0–44)
AST: 60 U/L — ABNORMAL HIGH (ref 15–41)
Albumin: 3.1 g/dL — ABNORMAL LOW (ref 3.5–5.0)
Alkaline Phosphatase: 126 U/L (ref 38–126)
Anion gap: 13 (ref 5–15)
BUN: 28 mg/dL — ABNORMAL HIGH (ref 8–23)
CO2: 19 mmol/L — ABNORMAL LOW (ref 22–32)
Calcium: 8 mg/dL — ABNORMAL LOW (ref 8.9–10.3)
Chloride: 103 mmol/L (ref 98–111)
Creatinine, Ser: 2.41 mg/dL — ABNORMAL HIGH (ref 0.44–1.00)
GFR, Estimated: 22 mL/min — ABNORMAL LOW (ref >60)
Glucose, Bld: 220 mg/dL — ABNORMAL HIGH (ref 70–99)
Potassium: 3.9 mmol/L (ref 3.5–5.1)
Sodium: 134 mmol/L — ABNORMAL LOW (ref 135–145)
Total Bilirubin: 0.4 mg/dL (ref 0.0–1.2)
Total Protein: 6.9 g/dL (ref 6.5–8.1)

## 2024-01-20 LAB — BLOOD GAS, VENOUS
Acid-base deficit: 5.1 mmol/L — ABNORMAL HIGH (ref 0.0–2.0)
Bicarbonate: 19.9 mmol/L — ABNORMAL LOW (ref 20.0–28.0)
O2 Saturation: 91 %
Patient temperature: 37
pCO2, Ven: 36 mmHg — ABNORMAL LOW (ref 44–60)
pH, Ven: 7.35 (ref 7.25–7.43)
pO2, Ven: 64 mmHg — ABNORMAL HIGH (ref 32–45)

## 2024-01-20 LAB — PRO BRAIN NATRIURETIC PEPTIDE: Pro Brain Natriuretic Peptide: 16767 pg/mL — ABNORMAL HIGH (ref <300.0)

## 2024-01-20 LAB — RESP PANEL BY RT-PCR (RSV, FLU A&B, COVID)  RVPGX2
Influenza A by PCR: NEGATIVE
Influenza B by PCR: NEGATIVE
Resp Syncytial Virus by PCR: NEGATIVE
SARS Coronavirus 2 by RT PCR: NEGATIVE

## 2024-01-20 LAB — CBC
HCT: 25.4 % — ABNORMAL LOW (ref 36.0–46.0)
Hemoglobin: 8 g/dL — ABNORMAL LOW (ref 12.0–15.0)
MCH: 30.5 pg (ref 26.0–34.0)
MCHC: 31.5 g/dL (ref 30.0–36.0)
MCV: 96.9 fL (ref 80.0–100.0)
Platelets: 262 K/uL (ref 150–400)
RBC: 2.62 MIL/uL — ABNORMAL LOW (ref 3.87–5.11)
RDW: 16.4 % — ABNORMAL HIGH (ref 11.5–15.5)
WBC: 3.8 K/uL — ABNORMAL LOW (ref 4.0–10.5)
nRBC: 0 % (ref 0.0–0.2)

## 2024-01-20 LAB — TROPONIN T, HIGH SENSITIVITY
Troponin T High Sensitivity: 164 ng/L (ref 0–19)
Troponin T High Sensitivity: 126 ng/L (ref 0–19)

## 2024-01-20 LAB — CREATININE, SERUM
Creatinine, Ser: 2.3 mg/dL — ABNORMAL HIGH (ref 0.44–1.00)
GFR, Estimated: 23 mL/min — ABNORMAL LOW (ref >60)

## 2024-01-20 LAB — MAGNESIUM: Magnesium: 2.1 mg/dL (ref 1.7–2.4)

## 2024-01-20 LAB — LACTIC ACID, PLASMA: Lactic Acid, Venous: 1.6 mmol/L (ref 0.5–1.9)

## 2024-01-20 LAB — VANCOMYCIN, RANDOM: Vancomycin Rm: 17 ug/mL

## 2024-01-20 LAB — PROCALCITONIN: Procalcitonin: 0.29 ng/mL

## 2024-01-20 MED ORDER — SODIUM CHLORIDE 0.9 % IV SOLN: 1.0000 g | Freq: Two times a day (BID) | INTRAVENOUS | Status: DC

## 2024-01-20 MED ORDER — SODIUM CHLORIDE 0.9 % IV SOLN
2.0000 g | INTRAVENOUS | Status: DC
  Administered 2024-01-21: 2 g via INTRAVENOUS
  Filled 2024-01-20 (×2): qty 12.5

## 2024-01-20 MED ORDER — IPRATROPIUM-ALBUTEROL 0.5-2.5 (3) MG/3ML IN SOLN
6.0000 mL | Freq: Once | RESPIRATORY_TRACT | Status: AC
  Administered 2024-01-20: 6 mL via RESPIRATORY_TRACT
  Filled 2024-01-20: qty 9

## 2024-01-20 MED ORDER — ACETAMINOPHEN 500 MG PO TABS
1000.0000 mg | ORAL_TABLET | Freq: Once | ORAL | Status: AC
  Administered 2024-01-20: 1000 mg via ORAL
  Filled 2024-01-20: qty 2

## 2024-01-20 MED ORDER — ENOXAPARIN SODIUM 30 MG/0.3ML IJ SOSY
30.0000 mg | PREFILLED_SYRINGE | INTRAMUSCULAR | Status: DC
  Administered 2024-01-21 (×2): 30 mg via SUBCUTANEOUS
  Filled 2024-01-20 (×3): qty 0.3

## 2024-01-20 MED ORDER — VANCOMYCIN VARIABLE DOSE PER UNSTABLE RENAL FUNCTION (PHARMACIST DOSING): Status: DC

## 2024-01-20 MED ORDER — ACETAMINOPHEN 650 MG RE SUPP: 650.0000 mg | Freq: Four times a day (QID) | RECTAL | Status: DC | PRN

## 2024-01-20 MED ORDER — LACTATED RINGERS IV SOLN: INTRAVENOUS | Status: DC

## 2024-01-20 MED ORDER — SENNOSIDES-DOCUSATE SODIUM 8.6-50 MG PO TABS: 1.0000 | ORAL_TABLET | Freq: Every evening | ORAL | Status: DC | PRN

## 2024-01-20 MED ORDER — ACETAMINOPHEN 325 MG PO TABS
650.0000 mg | ORAL_TABLET | Freq: Four times a day (QID) | ORAL | Status: DC | PRN
  Administered 2024-01-21 (×2): 650 mg via ORAL
  Filled 2024-01-20 (×2): qty 2

## 2024-01-20 MED ORDER — ONDANSETRON HCL 4 MG/2ML IJ SOLN
4.0000 mg | Freq: Four times a day (QID) | INTRAMUSCULAR | Status: DC | PRN
  Administered 2024-01-23 – 2024-01-24 (×3): 4 mg via INTRAVENOUS
  Filled 2024-01-20 (×3): qty 2

## 2024-01-20 MED ORDER — VANCOMYCIN HCL 1500 MG/300ML IV SOLN
1500.0000 mg | Freq: Once | INTRAVENOUS | Status: AC
  Administered 2024-01-20: 1500 mg via INTRAVENOUS
  Filled 2024-01-20: qty 300

## 2024-01-20 MED ORDER — ONDANSETRON HCL 4 MG PO TABS
4.0000 mg | ORAL_TABLET | Freq: Four times a day (QID) | ORAL | Status: DC | PRN
  Administered 2024-01-24 – 2024-01-26 (×2): 4 mg via ORAL
  Filled 2024-01-20 (×3): qty 1

## 2024-01-20 MED ORDER — COLLAGENASE 250 UNIT/GM EX OINT
TOPICAL_OINTMENT | Freq: Every day | CUTANEOUS | Status: DC
  Filled 2024-01-20 (×2): qty 30

## 2024-01-20 MED ORDER — IPRATROPIUM-ALBUTEROL 0.5-2.5 (3) MG/3ML IN SOLN
3.0000 mL | RESPIRATORY_TRACT | Status: DC | PRN
  Administered 2024-01-21 – 2024-01-25 (×4): 3 mL via RESPIRATORY_TRACT
  Filled 2024-01-20 (×4): qty 3

## 2024-01-20 MED ORDER — SODIUM CHLORIDE 0.9 % IV SOLN
2.0000 g | Freq: Once | INTRAVENOUS | Status: AC
  Administered 2024-01-20: 2 g via INTRAVENOUS
  Filled 2024-01-20: qty 12.5

## 2024-01-20 NOTE — H&P (Addendum)
 History and Physical    [name] FMW:[id number] DOB: [birth year] DOA: 01/20/2024 DOS: the patient was seen and examined on 01/20/2024 PCP: Glenard Mire, MD  Patient coming from: Home  Chief Complaint:  Chief Complaint  Patient presents with   Altered Mental Status    PT to ER via EMS for C/O increased altered mentation per Son - patient found to be in need of a breathing treatment due to wheezing and shortness of breath given a duoneb and solumedrol 125mg    HPI: Tricia [name] is a 63 y.o. female with medical history significant of anxiety, osteoarthritis, asthma, COPD,  GERD, dyslipidemia, and HTN presents to the emergency department via EMS from home.  No family at bedside to provide history, patient unable to stay awake due to provide history.  ED nurses did report that earlier she said she is not being cared for in the home and she does not want to return.  Per EMS report, they were called for altered mental status.  When they arrived, she was wheezing and short of breath.  She was given a DuoNeb and Solu-Medrol  and brought to the emergency department.   In the emergency department, she is febrile with a temperature of 100.9, and sinus tachycardia, with normal blood pressure, saturating appropriately on 4L [place].  There are no documented desaturations.  No leukocytosis.  Lactic acid reassuring. She had a BUN of 28 creatinine 2.41, with a baseline around 1.  Her troponin peaked at 164, BNP greater than 16,000.  EKG without evidence of acute ischemia.  She had a fairly unremarkable echo done in July of this year. CXR unremarkable. COVID/flu/RSV negative. UA not yet collected.  ABG with pH 7.35, pCO2 of 36. She was very drowsy but arousable during my exam, pinpoint pupils noted. Nurses noted she was ANO x 3 earlier and that she had not received anything besides the antibiotics.   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 Per that H/P Per the nurses, this had an old, dirty, saturated dressing on it on arrival.   Blood cultures were drawn, was given a dose of cefepime  and vancomycin , Hospitalist consulted for admission.   Review of Systems: Review of Systems  Unable to perform ROS: Mental status change    Past Medical History:  Diagnosis Date   Anxiety    Arthritis    joints and hands/ knees   Asthma    uses inhaler   Benign essential tremor    head   Cervical dystonia    neck pain   Cholesteatoma of left ear    x2   COPD (chronic obstructive pulmonary disease) (HCC)    Cough    Depression    Diabetes mellitus without complication (HCC)    type 2   Diastolic dysfunction    Dyspnea    Dysrhythmia    diastolic dysfunction   GERD (gastroesophageal reflux disease)    Headache    migraines/ one per week   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   Hyperlipidemia    Hypertension    Motion sickness    boat   Neuromuscular disorder (HCC)    neuropathy feet and hands( nerve damage)   Wears dentures    upper and lower   Past Surgical History:  Procedure Laterality Date   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   Procedure: AMPUTATION, TOE;  Surgeon: Lennie Barter, DPM;  Location: [facility]  ORS;  Service: Orthopedics/Podiatry;  Laterality: Left;  2nd Toe amputation   CARPAL TUNNEL RELEASE Bilateral    x2 right, 1x on left   COLONOSCOPY     COLONOSCOPY WITH PROPOFOL  N/A 04/25/2017   Procedure: COLONOSCOPY WITH PROPOFOL ;  Surgeon: Jinny Carmine, MD;  Location: [facility];  Service: Endoscopy;  Laterality: N/A;  diabetic-oral med   DILATION AND CURETTAGE OF UTERUS     ESOPHAGOGASTRODUODENOSCOPY (EGD) WITH PROPOFOL  N/A 01/10/2021   Procedure: ESOPHAGOGASTRODUODENOSCOPY (EGD) WITH PROPOFOL ;  Surgeon: Janalyn Keene NOVAK, MD;  Location: [facility];  Service: Endoscopy;  Laterality: N/A;  Diabetic   EXTERNAL EAR SURGERY Left    x2   IRRIGATION AND DEBRIDEMENT FOOT  Right 11/14/2023   Procedure: IRRIGATION AND DEBRIDEMENT FOOT;  Surgeon: Lennie Barter, DPM;  Location: [facility] ORS;  Service: Orthopedics/Podiatry;  Laterality: Right;   POLYPECTOMY  04/25/2017   Procedure: POLYPECTOMY INTESTINAL;  Surgeon: Jinny Carmine, MD;  Location: [facility];  Service: Endoscopy;;   SPINE SURGERY     herniated disc   TUBAL LIGATION     WOUND DEBRIDEMENT Bilateral 11/14/2023   Procedure: DEBRIDEMENT, WOUND;  Surgeon: Lennie Barter, DPM;  Location: [facility] ORS;  Service: Orthopedics/Podiatry;  Laterality: Bilateral;   Social History:  reports that she has been smoking cigarettes. She started smoking about 46 years ago. She has a 46.7 pack-year smoking history. She has never used smokeless tobacco. She reports that she does not drink alcohol and does not use drugs.  Allergies  Allergen Reactions   Augmentin  [Amoxicillin -Pot Clavulanate] Diarrhea   Penicillins Itching    Family History  Problem Relation Age of Onset   Emphysema Mother    Anxiety disorder Mother    Stroke Father    Throat cancer Father    Lung cancer Maternal Grandmother    Lung cancer Maternal Grandfather    Hypertension Daughter    Diabetes Daughter    Multiple sclerosis Daughter    Bipolar disorder Daughter    Cervical cancer Daughter    Bipolar disorder Daughter    Drug abuse Daughter    Lung cancer Maternal Aunt    Lung cancer Maternal Uncle     Prior to Admission medications   Medication Sig Start Date End Date Taking? Authorizing Provider  acetaminophen  (TYLENOL ) 650 MG suppository Place 1 suppository (650 mg total) rectally every 4 (four) hours as needed for fever (> 101.5). 11/17/23   Josette Ade, MD  calcium  carbonate (TUMS - DOSED IN MG ELEMENTAL CALCIUM ) 500 MG chewable tablet Chew 2 tablets (400 mg of elemental calcium  total) by mouth 3 (three) times daily as needed for indigestion or heartburn. 09/30/23   Alexander, Natalie, DO  carbidopa -levodopa  (SINEMET  CR) 50-200 MG  tablet Take 1 tablet by mouth at bedtime. 11/17/23   Josette Ade, MD  carbidopa -levodopa  (SINEMET  IR) 10-100 MG tablet Take 1 tablet by mouth 3 (three) times daily with meals. 11/17/23   Josette Ade, MD  colchicine  0.6 MG tablet Take 1 tablet (0.6 mg total) by mouth 2 (two) times daily as needed for up to 3 days (gout flare). 09/27/23 10/30/23  Alexander, Natalie, DO  DULoxetine  (CYMBALTA ) 60 MG capsule Take 1 capsule (60 mg total) by mouth daily. 05/01/23   Sowles, Krichna, MD  enoxaparin  (LOVENOX ) 40 MG/0.4ML injection Inject 0.4 mLs (40 mg total) into the skin daily for 14 days. 11/18/23 12/02/23  Josette Ade, MD  gabapentin  (NEURONTIN ) 300 MG capsule Take 2 capsules (600 mg total) by mouth 2 (two) times  daily AND 2 capsules (600 mg total) at bedtime. 11/17/23   Josette Ade, MD  ipratropium-albuterol  (DUONEB) 0.5-2.5 (3) MG/3ML SOLN Inhale 3 mLs into the lungs every 6 (six) hours as needed. 06/21/17   [name], Airen E, NP  leptospermum manuka honey (MEDIHONEY) PSTE paste Apply 1 Application topically daily. Apply to sacral wound Apply thin layer (3 mm) to wound. 09/28/23   Alexander, Natalie, DO  lidocaine  (LIDODERM ) 5 % Place 1 patch onto the skin daily. Remove & Discard patch within 12 hours or as directed by MD 11/17/23   Josette Ade, MD  lipase/protease/amylase (CREON ) 12000-38000 units CPEP capsule Take 1 capsule (12,000 Units total) by mouth 3 (three) times daily before meals. 11/17/23   Josette Ade, MD  Multiple Vitamin (MULTIVITAMIN WITH MINERALS) TABS tablet Take 1 tablet by mouth daily. 09/28/23   Alexander, Natalie, DO  omeprazole  (PRILOSEC) 40 MG capsule Take 1 capsule (40 mg total) by mouth daily. 05/01/23   Sowles, Krichna, MD  oxyCODONE  (OXY IR/ROXICODONE ) 5 MG immediate release tablet Take 1 tablet (5 mg total) by mouth every 6 (six) hours as needed for moderate pain (pain score 4-6) or severe pain (pain score 7-10). 11/17/23   Josette Ade, MD  QUEtiapine   (SEROQUEL ) 25 MG tablet Take 1 tablet (25 mg total) by mouth at bedtime. 05/01/23   Sowles, Krichna, MD  rosuvastatin  (CRESTOR ) 5 MG tablet Take 1 tablet (5 mg total) by mouth at bedtime. 05/01/23   Sowles, Krichna, MD  SYMBICORT  160-4.5 MCG/ACT inhaler INHALE 2 PUFFS TWICE A DAY RINSE MOUTH WITH WATER  AFTER EACH USE 07/09/22   Sowles, Krichna, MD  theophylline  (THEO-24) 200 MG 24 hr capsule Take 1 capsule (200 mg total) by mouth daily. 11/17/23 12/17/23  Josette Ade, MD  tiotropium (SPIRIVA ) 18 MCG inhalation capsule Place 1 capsule into inhaler and inhale daily. pm 08/27/13   Theotis Lavelle BRAVO, MD  torsemide  (DEMADEX ) 20 MG tablet Take 1 tablet (20 mg total) by mouth daily. 11/18/23   Josette Ade, MD  VENTOLIN  HFA 108 (90 Base) MCG/ACT inhaler INHALE 1 PUFF BY MOUTH AS NEEDED 04/26/20   Glenard, Krichna, MD  Vitamin D , Ergocalciferol , (DRISDOL ) 1.25 MG (50000 UNIT) CAPS capsule Take 1 capsule (50,000 Units total) by mouth every 7 (seven) days. 11/21/23   Josette Ade, MD    Physical Exam: Vitals:   01/20/24 1730 01/20/24 1745 01/20/24 1800 01/20/24 1840  BP: 118/68 (!) 111/59 118/69 124/61  Pulse: 98 97 95 92  Resp: 18 11 11 14   Temp:   99.8 F (37.7 C)   TempSrc:      SpO2: 100% 100% 100% 100%  Weight:       Physical Exam Vitals and nursing note reviewed.  Constitutional:      General: She is not in acute distress.    Appearance: She is ill-appearing (chronically).  HENT:     Head: Normocephalic.     Mouth/Throat:     Mouth: Mucous membranes are dry.  Eyes:     General: No scleral icterus. Cardiovascular:     Rate and Rhythm: Regular rhythm. Tachycardia present.  Pulmonary:     Effort: Pulmonary effort is normal. No respiratory distress.     Breath sounds: No wheezing.  Abdominal:     General: There is no distension.     Palpations: Abdomen is soft.     Tenderness: There is no abdominal tenderness. There is no guarding.     Comments:  bladder palpable, non-tender to  palpation  Musculoskeletal:     Cervical back: Neck supple.     Right lower leg: Edema present.     Left lower leg: Edema present.     Comments: Well healed amputation of 2nd toe on the left, stitches still in place   Skin:    Comments: Ulceration over right anterior shin, some mild surrounding erythema and minimal purulent drainage.  Multiple diffuse skin tears on b/l upper extremities, dressed in the ED   Neurological:     Comments: Drowsy but arousable  Pinpoint pupils  Moves all 4 extremities spontaneously    Psychiatric:     Comments: Unable to assess      Data Reviewed:   Labs on Admission: I have personally reviewed following labs and imaging studies  CBC: Recent Labs  Lab 01/20/24 1356  WBC 5.8  NEUTROABS 3.9  HGB 8.4*  HCT 26.5*  MCV 96.7  PLT 296   Basic Metabolic Panel: Recent Labs  Lab 01/20/24 1356  NA 134*  K 3.9  CL 103  CO2 19*  GLUCOSE 220*  BUN 28*  CREATININE 2.41*  CALCIUM  8.0*  MG 2.1   GFR: Estimated Creatinine Clearance: 22.6 mL/min (A) (by C-G formula based on SCr of 2.41 mg/dL (H)). Liver Function Tests: Recent Labs  Lab 01/20/24 1356  AST 60*  ALT 16  ALKPHOS 126  BILITOT 0.4  PROT 6.9  ALBUMIN  3.1*   No results for input(s): LIPASE, AMYLASE in the last 168 hours. No results for input(s): AMMONIA in the last 168 hours. Coagulation Profile: No results for input(s): INR, PROTIME in the last 168 hours. Cardiac Enzymes: No results for input(s): CKTOTAL, CKMB, CKMBINDEX, TROPONINI in the last 168 hours. BNP (last 3 results) Recent Labs    01/20/24 1430  PROBNP 16,767.0*   HbA1C: No results for input(s): HGBA1C in the last 72 hours. CBG: No results for input(s): GLUCAP in the last 168 hours. Lipid Profile: No results for input(s): CHOL, HDL, LDLCALC, TRIG, CHOLHDL, LDLDIRECT in the last 72 hours. Thyroid  Function Tests: No results for input(s): TSH, T4TOTAL, FREET4,  T3FREE, THYROIDAB in the last 72 hours. Anemia Panel: No results for input(s): VITAMINB12, FOLATE, FERRITIN, TIBC, IRON, RETICCTPCT in the last 72 hours. Urine analysis:    Component Value Date/Time   COLORURINE YELLOW (A) 11/01/2023 0050   APPEARANCEUR HAZY (A) 11/01/2023 0050   APPEARANCEUR Cloudy (A) 01/24/2015 0000   LABSPEC 1.011 11/01/2023 0050   LABSPEC 1.005 11/06/2012 2335   PHURINE 5.0 11/01/2023 0050   GLUCOSEU NEGATIVE 11/01/2023 0050   GLUCOSEU Negative 11/06/2012 2335   HGBUR NEGATIVE 11/01/2023 0050   BILIRUBINUR NEGATIVE 11/01/2023 0050   BILIRUBINUR Small 10/25/2021 1326   BILIRUBINUR Negative 01/24/2015 0000   BILIRUBINUR Negative 11/06/2012 2335   KETONESUR NEGATIVE 11/01/2023 0050   PROTEINUR NEGATIVE 11/01/2023 0050   UROBILINOGEN 0.2 10/25/2021 1326   NITRITE NEGATIVE 11/01/2023 0050   LEUKOCYTESUR MODERATE (A) 11/01/2023 0050   LEUKOCYTESUR 3+ 11/06/2012 2335    Radiological Exams on Admission: DG Chest Portable 1 View Result Date: 01/20/2024 CLINICAL DATA:  COPD exacerbation. EXAM: PORTABLE CHEST 1 VIEW COMPARISON:  Radiographs 11/13/2023 and 11/12/2023.  CTA 11/10/2023. FINDINGS: 1413 hours. Two views are submitted. The heart size and mediastinal contours are stable. Previously demonstrated small right pleural effusion has resolved. There is improved aeration of both lung bases with mild residual atelectasis or scarring on the right. No confluent airspace disease or pneumothorax. No acute osseous findings are evident. There are mild degenerative changes in  the spine. Telemetry leads overlie the chest. IMPRESSION: Interval resolution of right pleural effusion with improved aeration of both lung bases. No acute cardiopulmonary process. Electronically Signed   By: Elsie Perone M.D.   On: 01/20/2024 15:09       Assessment and Plan:   Sepsis without shock Wound infection, anterior right shin  - with fever and tachycardia, possible source  cellulitis?  - COVID/flu/RSV negative, unremarkable CXR - f/u blood cultures, wound culture, and UA  - f/u XR right tib/fib - cont cefepime  and vancomycin   - IVF - wound consult, assistance appreciated   Elevated troponin  - peaked at 163, no report of CP or concerning EKG findings. ECHO 08/2023 fairly unremarkable.  - telemetry - repeat ECHO   COPD Acute hypoxic respiratory failure - saturating 100% on 4L [place] in the ED, no documented hypoxia, unknown if she wears O2 baseline  - EMS reported wheezing and SOB on their arrival and she received solu-medrol  and duoneb en route.  On my exam breathing comfortably, no wheezing. Hold additional steroids - CXR unremarkable  - Consider rule out PE with CTA when renal function improves - continue PRN duonebs   AKI - check bladder scan, f/u UA  - IVF and monitor  - avoid nephrotoxic agents   AMS - per EMS report, ED nursing reporting a/o. Drowsy but arousable during my exam. ABG reassuring. Polypharmacy? Had similar presentation in September when she was hospitalized with bactremia 2/2 wound infection - f/u CT head, UDS, UA  - NPO pending SLP evaluation    Home medications not yet reconciled  Lovenox   NPO pending SLP evaluation  Monitor/replace electrolytes LR@75    Advance Care Planning:   Code Status: Full Code  assumed      Severity of Illness: The appropriate patient status for this patient is INPATIENT. Inpatient status is judged to be reasonable and necessary in order to provide the required intensity of service to ensure the patient's safety. [name] presenting symptoms, physical exam findings, and initial radiographic and laboratory data in the context of their chronic comorbidities is felt to place them at high risk for further clinical deterioration. Furthermore, it is not anticipated that the patient will be medically stable for discharge from the hospital within 2 midnights of admission.   * I certify that at the  point of admission it is my clinical judgment that the patient will require inpatient hospital care spanning beyond 2 midnights from the point of admission due to high intensity of service, high risk for further deterioration and high frequency of surveillance required.*  Author: Daved JAYSON Pump, DO 01/20/2024 7:29 PM  For on call review [email or web address].

## 2024-01-20 NOTE — Consult Note (Signed)
 Pharmacy Antibiotic Note  Tricia [name] is a 63 y.o. female admitted on 01/20/2024 with sepsis.  Pharmacy has been consulted for vancomycin  dosing. Scr is elevated from baseline.   Plan: Vancomycin  1500 mg x 1 given in the ED. Will dose by levels. Will order a 24 hour level from loading dose. Will follow up with Scr in the AM.   Cefepime  2 g x 1 given and cefepime  1 g BID was ordered. Will adjust to cefepime  2 g daily based on renal function.   Weight: 60 kg (132 lb 4.4 oz)  Temp (24hrs), Avg:99.6 F (37.6 C), Min:98 F (36.7 C), Max:100.9 F (38.3 C)  Recent Labs  Lab 01/20/24 1356 01/20/24 1430  WBC 5.8  --   CREATININE 2.41*  --   LATICACIDVEN  --  1.6    Estimated Creatinine Clearance: 22.6 mL/min (A) (by C-G formula based on SCr of 2.41 mg/dL (H)).    Allergies  Allergen Reactions   Augmentin  [Amoxicillin -Pot Clavulanate] Diarrhea   Penicillins Itching    Antimicrobials this admission: 12/1 vancomycin  >>  12/1 cefepime  >>   Dose adjustments this admission: None  Microbiology results: 12/1 BCx: in process.    Thank you for allowing pharmacy to be a part of this patient's care.  Cathaleen GORMAN Blanch, PharmD, BCPS 01/20/2024 7:36 PM

## 2024-01-20 NOTE — Progress Notes (Signed)
 PHARMACIST - PHYSICIAN COMMUNICATION  CONCERNING:  Enoxaparin  (Lovenox ) for DVT Prophylaxis   ASSESSMENT: Patient was prescribed enoxaparin  40 mg subcutaneously every 24 hours for VTE prophylaxis.   Body mass index is 20.11 kg/m.  Estimated Creatinine Clearance: 22.6 mL/min (A) (by C-G formula based on SCr of 2.41 mg/dL (H)).  Based on [facility] policy, patient qualifies for enoxaparin  dosing of 30 mg every 24 hours because their creatinine clearance is <30 mL/min.  PLAN: Pharmacy has adjusted enoxaparin  dose per [facility] policy.  Description: Patient is now receiving enoxaparin  30 mg subcutaneously every 24 hours.  Will M. Lenon, PharmD, BCPS Clinical Pharmacist 01/20/2024 7:32 PM

## 2024-01-20 NOTE — Consult Note (Signed)
 WOC Nurse Consult Note: patient is known to [facility] team from previous admission with B lower extremity wounds felt to be due to trauma  Reason for Consult: R lower leg wound  Wound type: 1.  Full thickness R lower leg likely r/t trauma infectious at this time dark hemorrhagic and necrotic tissue  2. Full thickness L lower leg likely r/t trauma dry brown tissue  3.  Full thickness B knees likely r/t trauma with dry scabbed  Pressure Injury POA: na not pressure  Measurement: see nursing flowsheet  Wound bed: as above  Drainage (amount, consistency, odor) some purulent foul smelling to R lower leg wound  Periwound: erythema Dressing:  1. Cleanse R and L lower leg wounds with Vashe, do not rinse.  Apply 1/4 thick layer of Santyl  to wound beds daily,  top with saline moist gauze and dry gauze. Secure with silicone foam or Kerlix roll gauze anchored around foot which works best.  2.  Cleanse B knee wounds with Vashe, apply xeroform gauze Soila [id number]) to wound beds daily and secure with silicone foam.   POC discussed with bedside nurse. WOC team will not follow.  Reconsult if further needs arise.   Thank you,    Powell Bar MSN, RN-BC, [employer]

## 2024-01-20 NOTE — ED Provider Notes (Signed)
 [facility] Provider Note    Event Date/Time   First MD Initiated Contact with Patient 01/20/24 1350     (approximate)   History   Altered Mental Status (PT to ER via EMS for C/O increased altered mentation per Son - patient found to be in need of a breathing treatment due to wheezing and shortness of breath given a duoneb and solumedrol 125mg )   HPI  Tricia [name] is a 63 y.o. female who presents to the ED for evaluation of Altered Mental Status (PT to ER via EMS for C/O increased altered mentation per Son - patient found to be in need of a breathing treatment due to wheezing and shortness of breath given a duoneb and solumedrol 125mg )   I review medical DC summary from 2 months ago, admitted for sepsis due to cellulitis of the right foot.  History of COPD.  Patient presents for evaluation of confusion, shortness of breath.  Patient is repetitive and reports son-in-law does not care for her at home.  History is limited.   Physical Exam   Triage Vital Signs: ED Triage Vitals [01/20/24 1350]  Encounter Vitals Group     BP 132/65     Girls Systolic BP Percentile      Girls Diastolic BP Percentile      Boys Systolic BP Percentile      Boys Diastolic BP Percentile      Pulse Rate (!) 104     Resp (!) 22     Temp 98 F (36.7 C)     Temp Source Oral     SpO2      Weight      Height      Head Circumference      Peak Flow      Pain Score      Pain Loc      Pain Education      Exclude from Growth Chart     Most recent vital signs: Vitals:   01/20/24 1350 01/20/24 1413  BP: 132/65   Pulse: (!) 104   Resp: (!) 22   Temp: 98 F (36.7 C) (!) 100.9 F (38.3 C)    General: Awake, no distress.  Warm to the touch CV:  Good peripheral perfusion.  Resp:  Mild tachypnea without distress.  Wheezing throughout, slightly decreased airflow. Abd:  No distention.  Soft and nontender MSK:  Ulcerative lesion to the right anterior shin as pictured  below with surrounding erythema concerning for cellulitis.  No purulence. Neuro:  No focal deficits appreciated. Other:        ED Results / Procedures / Treatments   Labs (all labs ordered are listed, but only abnormal results are displayed) Labs Reviewed  BLOOD GAS, VENOUS - Abnormal; Notable for the following components:      Result Value   pCO2, Ven 36 (*)    pO2, Ven 64 (*)    Bicarbonate 19.9 (*)    Acid-base deficit 5.1 (*)    All other components within normal limits  RESP PANEL BY RT-PCR (RSV, FLU A&B, COVID)  RVPGX2  CULTURE, BLOOD (ROUTINE X 2)  CULTURE, BLOOD (ROUTINE X 2)  COMPREHENSIVE METABOLIC PANEL WITH GFR  CBC WITH DIFFERENTIAL/PLATELET  MAGNESIUM   PRO BRAIN NATRIURETIC PEPTIDE  LACTIC ACID, PLASMA  PROCALCITONIN  TROPONIN T, HIGH SENSITIVITY    EKG Sinus rhythm at a rate of 103 bpm.  Normal axis and intervals.  No acute signs of acute ischemia.  RADIOLOGY CXR interpreted by me without evidence of acute cardiopulmonary pathology.  Official radiology report(s): DG Chest Portable 1 View Result Date: 01/20/2024 CLINICAL DATA:  COPD exacerbation. EXAM: PORTABLE CHEST 1 VIEW COMPARISON:  Radiographs 11/13/2023 and 11/12/2023.  CTA 11/10/2023. FINDINGS: 1413 hours. Two views are submitted. The heart size and mediastinal contours are stable. Previously demonstrated small right pleural effusion has resolved. There is improved aeration of both lung bases with mild residual atelectasis or scarring on the right. No confluent airspace disease or pneumothorax. No acute osseous findings are evident. There are mild degenerative changes in the spine. Telemetry leads overlie the chest. IMPRESSION: Interval resolution of right pleural effusion with improved aeration of both lung bases. No acute cardiopulmonary process. Electronically Signed   By: Elsie Perone M.D.   On: 01/20/2024 15:09    PROCEDURES and INTERVENTIONS:  .1-3 Lead EKG Interpretation  Performed by:  Claudene Rover, MD Authorized by: Claudene Rover, MD     Interpretation: abnormal     ECG rate:  106   ECG rate assessment: tachycardic     Rhythm: sinus tachycardia     Ectopy: none     Conduction: normal   .Critical Care  Performed by: Claudene Rover, MD Authorized by: Claudene Rover, MD   Critical care provider statement:    Critical care time (minutes):  30   Critical care time was exclusive of:  Separately billable procedures and treating other patients   Critical care was necessary to treat or prevent imminent or life-threatening deterioration of the following conditions:  Sepsis   Critical care was time spent personally by me on the following activities:  Development of treatment plan with patient or surrogate, discussions with consultants, evaluation of patient's response to treatment, examination of patient, ordering and review of laboratory studies, ordering and review of radiographic studies, ordering and performing treatments and interventions, pulse oximetry, re-evaluation of patient's condition and review of old charts   Medications  ipratropium-albuterol  (DUONEB) 0.5-2.5 (3) MG/3ML nebulizer solution 6 mL (has no administration in time range)  acetaminophen  (TYLENOL ) tablet 1,000 mg (has no administration in time range)  ceFEPIme  (MAXIPIME ) 2 g in sodium chloride  0.9 % 100 mL IVPB (has no administration in time range)  vancomycin  (VANCOREADY) IVPB 1500 mg/300 mL (has no administration in time range)     IMPRESSION / MDM / ASSESSMENT AND PLAN / ED COURSE  I reviewed the triage vital signs and the nursing notes.  Differential diagnosis includes, but is not limited to, stroke, polypharmacy, hypercarbia, metabolic encephalopathy, sepsis  {Patient presents with symptoms of an acute illness or injury that is potentially life-threatening.  Patient presents to the ED with confusion and had a fever and wheezing with signs of COPD exacerbation.  Criteria with temperature, heart rate  and respiratory rate, stable without signs of shock.  Possibly source from her right shin she has a cellulitic lesion over what appears to be a chronic ulcerative lesion.  Majority of blood work is pending at time of signout to oncoming physician with anticipation of medical admission.  Broad-spectrum antibiotics ordered.      FINAL CLINICAL IMPRESSION(S) / ED DIAGNOSES   Final diagnoses:  None     Rx / DC Orders   ED Discharge Orders     None        Note:  This document was prepared using Dragon voice recognition software and may include unintentional dictation errors.   Claudene Rover, MD 01/20/24 [phone]

## 2024-01-21 ENCOUNTER — Ambulatory Visit: Admitting: Family Medicine

## 2024-01-21 ENCOUNTER — Inpatient Hospital Stay

## 2024-01-21 ENCOUNTER — Inpatient Hospital Stay
Admit: 2024-01-21 | Discharge: 2024-01-21 | Disposition: A | Attending: Emergency Medicine | Admitting: Emergency Medicine

## 2024-01-21 DIAGNOSIS — K219 Gastro-esophageal reflux disease without esophagitis: Secondary | ICD-10-CM | POA: Diagnosis not present

## 2024-01-21 DIAGNOSIS — J449 Chronic obstructive pulmonary disease, unspecified: Secondary | ICD-10-CM

## 2024-01-21 DIAGNOSIS — N179 Acute kidney failure, unspecified: Secondary | ICD-10-CM

## 2024-01-21 DIAGNOSIS — I5030 Unspecified diastolic (congestive) heart failure: Secondary | ICD-10-CM

## 2024-01-21 DIAGNOSIS — J9601 Acute respiratory failure with hypoxia: Secondary | ICD-10-CM | POA: Diagnosis not present

## 2024-01-21 DIAGNOSIS — I130 Hypertensive heart and chronic kidney disease with heart failure and stage 1 through stage 4 chronic kidney disease, or unspecified chronic kidney disease: Secondary | ICD-10-CM | POA: Diagnosis present

## 2024-01-21 DIAGNOSIS — I5189 Other ill-defined heart diseases: Secondary | ICD-10-CM

## 2024-01-21 DIAGNOSIS — L0390 Cellulitis, unspecified: Secondary | ICD-10-CM | POA: Diagnosis not present

## 2024-01-21 DIAGNOSIS — R339 Retention of urine, unspecified: Secondary | ICD-10-CM | POA: Diagnosis present

## 2024-01-21 DIAGNOSIS — G20A1 Parkinson's disease without dyskinesia, without mention of fluctuations: Secondary | ICD-10-CM

## 2024-01-21 DIAGNOSIS — R7989 Other specified abnormal findings of blood chemistry: Secondary | ICD-10-CM | POA: Diagnosis present

## 2024-01-21 DIAGNOSIS — N183 Chronic kidney disease, stage 3 unspecified: Secondary | ICD-10-CM | POA: Insufficient documentation

## 2024-01-21 DIAGNOSIS — E119 Type 2 diabetes mellitus without complications: Secondary | ICD-10-CM | POA: Diagnosis not present

## 2024-01-21 DIAGNOSIS — R4182 Altered mental status, unspecified: Secondary | ICD-10-CM | POA: Diagnosis not present

## 2024-01-21 DIAGNOSIS — N189 Chronic kidney disease, unspecified: Secondary | ICD-10-CM

## 2024-01-21 DIAGNOSIS — A419 Sepsis, unspecified organism: Secondary | ICD-10-CM | POA: Diagnosis not present

## 2024-01-21 LAB — CBC
HCT: 23.5 % — ABNORMAL LOW (ref 36.0–46.0)
Hemoglobin: 7.5 g/dL — ABNORMAL LOW (ref 12.0–15.0)
MCH: 30.9 pg (ref 26.0–34.0)
MCHC: 31.9 g/dL (ref 30.0–36.0)
MCV: 96.7 fL (ref 80.0–100.0)
Platelets: 249 K/uL (ref 150–400)
RBC: 2.43 MIL/uL — ABNORMAL LOW (ref 3.87–5.11)
RDW: 16 % — ABNORMAL HIGH (ref 11.5–15.5)
WBC: 4.3 K/uL (ref 4.0–10.5)
nRBC: 0 % (ref 0.0–0.2)

## 2024-01-21 LAB — URINE DRUG SCREEN
Amphetamines: NEGATIVE
Barbiturates: NEGATIVE
Benzodiazepines: NEGATIVE
Cocaine: NEGATIVE
Fentanyl: NEGATIVE
Methadone Scn, Ur: NEGATIVE
Opiates: POSITIVE — AB
Tetrahydrocannabinol: NEGATIVE

## 2024-01-21 LAB — ECHOCARDIOGRAM COMPLETE
AR max vel: 1.47 cm2
AV Area VTI: 1.87 cm2
AV Area mean vel: 1.42 cm2
AV Mean grad: 11 mmHg
AV Peak grad: 18 mmHg
Ao pk vel: 2.12 m/s
Area-P 1/2: 4.49 cm2
Height: 68 in
MV M vel: 5.37 m/s
MV Peak grad: 115.3 mmHg
MV VTI: 2.11 cm2
Radius: 0.5 cm
S' Lateral: 3.5 cm
Weight: 2116.42 [oz_av]

## 2024-01-21 LAB — URINALYSIS, W/ REFLEX TO CULTURE (INFECTION SUSPECTED)
Bilirubin Urine: NEGATIVE
Glucose, UA: >=500 mg/dL — AB
Ketones, ur: NEGATIVE mg/dL
Nitrite: NEGATIVE
Protein, ur: 100 mg/dL — AB
RBC / HPF: >50 RBC/hpf (ref 0–5)
Specific Gravity, Urine: 1.009 (ref 1.005–1.030)
WBC, UA: >50 WBC/hpf (ref 0–5)
pH: 5 (ref 5.0–8.0)

## 2024-01-21 LAB — BASIC METABOLIC PANEL WITH GFR
Anion gap: 12 (ref 5–15)
BUN: 31 mg/dL — ABNORMAL HIGH (ref 8–23)
CO2: 20 mmol/L — ABNORMAL LOW (ref 22–32)
Calcium: 7.9 mg/dL — ABNORMAL LOW (ref 8.9–10.3)
Chloride: 105 mmol/L (ref 98–111)
Creatinine, Ser: 2.16 mg/dL — ABNORMAL HIGH (ref 0.44–1.00)
GFR, Estimated: 25 mL/min — ABNORMAL LOW (ref >60)
Glucose, Bld: 146 mg/dL — ABNORMAL HIGH (ref 70–99)
Potassium: 4 mmol/L (ref 3.5–5.1)
Sodium: 137 mmol/L (ref 135–145)

## 2024-01-21 LAB — VANCOMYCIN, RANDOM: Vancomycin Rm: 12 ug/mL

## 2024-01-21 MED ORDER — THEOPHYLLINE ER 400 MG PO TB24
400.0000 mg | ORAL_TABLET | Freq: Every day | ORAL | Status: DC
  Administered 2024-01-21 – 2024-01-26 (×6): 400 mg via ORAL
  Filled 2024-01-21 (×7): qty 1

## 2024-01-21 MED ORDER — ROSUVASTATIN CALCIUM 10 MG PO TABS
5.0000 mg | ORAL_TABLET | Freq: Every day | ORAL | Status: DC
  Administered 2024-01-21 – 2024-01-25 (×5): 5 mg via ORAL
  Filled 2024-01-21 (×5): qty 1

## 2024-01-21 MED ORDER — PANTOPRAZOLE SODIUM 40 MG PO TBEC
40.0000 mg | DELAYED_RELEASE_TABLET | Freq: Every day | ORAL | Status: DC
  Administered 2024-01-21 – 2024-01-26 (×6): 40 mg via ORAL
  Filled 2024-01-21 (×6): qty 1

## 2024-01-21 MED ORDER — TIOTROPIUM BROMIDE MONOHYDRATE 18 MCG IN CAPS: 1.0000 | ORAL_CAPSULE | Freq: Every day | RESPIRATORY_TRACT | Status: DC

## 2024-01-21 MED ORDER — GABAPENTIN 300 MG PO CAPS
600.0000 mg | ORAL_CAPSULE | Freq: Two times a day (BID) | ORAL | Status: DC
  Administered 2024-01-21 – 2024-01-26 (×10): 600 mg via ORAL
  Filled 2024-01-21 (×10): qty 2

## 2024-01-21 MED ORDER — CALCIUM CARBONATE ANTACID 500 MG PO CHEW
400.0000 mg | CHEWABLE_TABLET | Freq: Three times a day (TID) | ORAL | Status: DC | PRN
  Administered 2024-01-23 – 2024-01-24 (×2): 400 mg via ORAL
  Filled 2024-01-21 (×2): qty 2

## 2024-01-21 MED ORDER — CARBIDOPA-LEVODOPA 10-100 MG PO TABS
1.0000 | ORAL_TABLET | Freq: Three times a day (TID) | ORAL | Status: DC
  Administered 2024-01-22 – 2024-01-26 (×14): 1 via ORAL
  Filled 2024-01-21 (×16): qty 1

## 2024-01-21 MED ORDER — DULOXETINE HCL 30 MG PO CPEP
60.0000 mg | ORAL_CAPSULE | Freq: Every day | ORAL | Status: DC
  Administered 2024-01-21 – 2024-01-26 (×6): 60 mg via ORAL
  Filled 2024-01-21 (×6): qty 2

## 2024-01-21 MED ORDER — HYDROXYZINE HCL 50 MG PO TABS
25.0000 mg | ORAL_TABLET | Freq: Two times a day (BID) | ORAL | Status: DC
  Administered 2024-01-21 – 2024-01-26 (×9): 25 mg via ORAL
  Filled 2024-01-21 (×10): qty 1

## 2024-01-21 MED ORDER — MONTELUKAST SODIUM 10 MG PO TABS
10.0000 mg | ORAL_TABLET | Freq: Every day | ORAL | Status: DC
  Administered 2024-01-21 – 2024-01-25 (×5): 10 mg via ORAL
  Filled 2024-01-21 (×6): qty 1

## 2024-01-21 MED ORDER — OXYCODONE HCL 5 MG PO TABS
5.0000 mg | ORAL_TABLET | Freq: Four times a day (QID) | ORAL | Status: DC | PRN
  Administered 2024-01-21 – 2024-01-26 (×9): 5 mg via ORAL
  Filled 2024-01-21 (×10): qty 1

## 2024-01-21 MED ORDER — UMECLIDINIUM BROMIDE 62.5 MCG/ACT IN AEPB
1.0000 | INHALATION_SPRAY | Freq: Every day | RESPIRATORY_TRACT | Status: DC
  Administered 2024-01-22 – 2024-01-26 (×5): 1 via RESPIRATORY_TRACT
  Filled 2024-01-21: qty 7

## 2024-01-21 MED ORDER — CYCLOBENZAPRINE HCL 10 MG PO TABS
10.0000 mg | ORAL_TABLET | Freq: Three times a day (TID) | ORAL | Status: DC
  Administered 2024-01-21 – 2024-01-26 (×13): 10 mg via ORAL
  Filled 2024-01-21 (×13): qty 1

## 2024-01-21 MED ORDER — LACTATED RINGERS IV SOLN: INTRAVENOUS | Status: DC

## 2024-01-21 MED ORDER — FLUTICASONE FUROATE-VILANTEROL 200-25 MCG/ACT IN AEPB
1.0000 | INHALATION_SPRAY | Freq: Every day | RESPIRATORY_TRACT | Status: DC
  Administered 2024-01-22 – 2024-01-26 (×5): 1 via RESPIRATORY_TRACT
  Filled 2024-01-21: qty 28

## 2024-01-21 MED ORDER — CHLORHEXIDINE GLUCONATE CLOTH 2 % EX PADS
6.0000 | MEDICATED_PAD | Freq: Every day | CUTANEOUS | Status: DC
  Administered 2024-01-21 – 2024-01-26 (×5): 6 via TOPICAL

## 2024-01-21 MED ORDER — ADULT MULTIVITAMIN W/MINERALS CH
1.0000 | ORAL_TABLET | Freq: Every day | ORAL | Status: DC
  Administered 2024-01-21 – 2024-01-26 (×6): 1 via ORAL
  Filled 2024-01-21 (×6): qty 1

## 2024-01-21 MED ORDER — PANCRELIPASE (LIP-PROT-AMYL) 12000-38000 UNITS PO CPEP
12000.0000 [IU] | ORAL_CAPSULE | Freq: Three times a day (TID) | ORAL | Status: DC
  Administered 2024-01-21 – 2024-01-26 (×15): 12000 [IU] via ORAL
  Filled 2024-01-21 (×15): qty 1

## 2024-01-21 MED ORDER — QUETIAPINE FUMARATE 25 MG PO TABS
25.0000 mg | ORAL_TABLET | Freq: Every day | ORAL | Status: DC
  Administered 2024-01-21 – 2024-01-25 (×5): 25 mg via ORAL
  Filled 2024-01-21 (×5): qty 1

## 2024-01-21 MED ORDER — CARBIDOPA-LEVODOPA ER 50-200 MG PO TBCR
1.0000 | EXTENDED_RELEASE_TABLET | Freq: Every day | ORAL | Status: DC
  Administered 2024-01-21 – 2024-01-25 (×5): 1 via ORAL
  Filled 2024-01-21 (×5): qty 1

## 2024-01-21 MED ORDER — VANCOMYCIN HCL 750 MG/150ML IV SOLN
750.0000 mg | Freq: Once | INTRAVENOUS | Status: AC
  Administered 2024-01-21: 750 mg via INTRAVENOUS
  Filled 2024-01-21: qty 150

## 2024-01-21 NOTE — Progress Notes (Signed)
*  PRELIMINARY RESULTS* Echocardiogram 2D Echocardiogram has been performed.  Floydene Harder 01/21/2024, 1:44 PM

## 2024-01-21 NOTE — Evaluation (Signed)
 Occupational Therapy Evaluation Patient Details Name: Tricia [name] MRN: [id number] DOB: 1960-05-03 Today's Date: 01/21/2024   History of Present Illness   Pt is a 63 y.o. female admitted with AMS, sepsis, anterior right shin wound infection, acute hypoxic respiratory failure, AKI. 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     Clinical Impressions Pt admitted with above. Prior to admission, pt reports living with son-in-law who works as a it trainer and does not provide much assist. She uses electric w/c inside the home, and is able to ambulate up 4 STE to get to w/c, is mod independent for ADL/IADL performance although do question accuracy of this. Pt with multiple skin tears/bruises on back, RUE/LUE (attributes to w/c and bumping into walls) and dressings on anterior shin wounds. 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 Pt reporting concerns of R hand with visible wrist drop present - also endorsing changes in sensation with tingling in digits, noted decreased strength, FMC/GMC of dominant side. Pt unable to actively extend R wrist, but does have some functional grasp when pushing up from [facility] for STS transfers. RN in room to assess, secure chat to MD during session. MRI ordered, OT will continue to follow for results and assess as appropriate. Pt may need splinting to promote joint integrity and functional positioning for ADL tasks.   Pt received on BSC with PT present, requires MAX A for standing pericare after continent BM, MIN A- CGA with +1 HHA to ambulate from bed around room to recliner. Anticipate pt will continue to require up to MAX A for LB ADL performance, and +1 for mobility. Pt would  benefit from skilled OT services to address noted impairments and functional limitations (see below for any additional details) in order to maximize safety and independence while minimizing falls risk and caregiver burden. Anticipate the need for follow up OT services upon acute hospital DC. Patient will benefit from continued inpatient follow up therapy, <3 hours/day      If plan is discharge home, recommend the following:   A lot of help with bathing/dressing/bathroom;A little help with walking and/or transfers;Supervision due to cognitive status;Help with stairs or ramp for entrance;Direct supervision/assist for financial management;Direct supervision/assist for medications management;Assist for transportation     Functional Status Assessment   Patient has had a recent decline in their functional status and demonstrates the ability to make significant improvements in function in a reasonable and predictable amount of time.     Equipment Recommendations   None recommended by OT      Precautions/Restrictions   Precautions Precautions: Fall Recall of Precautions/Restrictions: Impaired Restrictions Weight Bearing Restrictions Per Provider Order: No     Mobility Bed Mobility Overal bed mobility: Needs Assistance Bed Mobility: Supine to Sit     Supine to sit: Contact guard          Transfers Overall transfer level: Needs assistance Equipment used: None Transfers: Sit to/from Stand, Bed to chair/wheelchair/BSC Sit to Stand: Contact guard assist     Step pivot transfers: Contact guard assist            Balance Overall balance assessment: Needs assistance Sitting-balance support: No upper extremity supported, Feet supported Sitting balance-Leahy Scale: Good  Standing balance support: Single extremity supported, During functional activity Standing balance-Leahy Scale: Fair                             ADL either performed or assessed with  clinical judgement   ADL Overall ADL's : Needs assistance/impaired                         Toilet Transfer: Contact guard Engineer, [employer] Details (indicate cue type and reason): recieved on BSC, CGA for functional STS transfers. Toileting- Clothing Manipulation and Hygiene: Maximal assistance;Sit to/from stand Toileting - Clothing Manipulation Details (indicate cue type and reason): MAX A for pericare in standing after continent BM     Functional mobility during ADLs: Contact guard assist General ADL Comments: CGA to ambulate around bed to recliner.     Vision Baseline Vision/History: 1 Wears glasses Ability to See in Adequate Light: 1 Impaired Patient Visual Report: No change from baseline Vision Assessment?: Wears glasses for reading            Pertinent Vitals/Pain Pain Assessment Pain Assessment: No/denies pain     Extremity/Trunk Assessment Upper Extremity Assessment Upper Extremity Assessment: Right hand dominant;RUE deficits/detail;Generalized weakness RUE Deficits / Details: Pt with significant R wrist drop and inability to extend R wrist. R-sided weakness compared to L. Pt states tingling in all digits, decreased FMC / GMC of RUE with impaired digit opposition. RUE strength 3-/5, poor grip strength. Pt able to use R hand to push up from [place], no inattention noted. RUE Sensation: decreased light touch;decreased proprioception;history of peripheral neuropathy RUE Coordination: decreased gross motor;decreased fine motor   Lower Extremity Assessment Lower Extremity Assessment: Generalized weakness (sutures present in L 2nd toe amp and bottom of R foot since 9/25. Pt reports unable to get sutures removed 2/2 transporation.)   Cervical / Trunk Assessment Cervical / Trunk Assessment: Normal   Communication Communication Communication: No apparent difficulties   Cognition Arousal: Alert Behavior During Therapy: WFL for tasks  assessed/performed Cognition: Cognition impaired   Orientation impairments: Situation Awareness: Intellectual awareness impaired                         Following commands: Impaired Following commands impaired: Follows one step commands with increased time     Cueing  General Comments   Cueing Techniques: Verbal cues;Gestural cues  On 4L O2 on arrival, SpO2 >98%. Doffed for session with SpO2>92% throughout on RA. RN notified. Sutures in both feet from 9/25. Secure chat to MD with RUE concerns (changes in strength, sensation, weakness, R wrist drop). RN in room to assess. MD ordered MRI - will follow for results.           Home Living Family/patient expects to be discharged to:: Private residence Living Arrangements: Other relatives (son in law) Available Help at Discharge: Available PRN/intermittently Type of Home: House Home Access: Stairs to enter [employer] of Steps: 4 Entrance Stairs-Rails: Left;Right Home Layout: One level     Bathroom Shower/Tub: Tub/shower unit;Walk-in shower         Home Equipment: Agricultural Consultant (2 wheels);Shower seat;Wheelchair - power          Prior Functioning/Environment Prior Level of Function : Patient poor historian/Family not available             Mobility Comments: per patient, she uses electric w/c in the house (unable to get  out of the house with w/c). Ambulatory outside of home with no AD. Unsure of accuracy as patient is poor historian ADLs Comments: Pt reports independence with ADLs, and IADLs. Unsure of accuracy as pt poor historian.    OT Problem List: Decreased strength;Decreased range of motion;Decreased activity tolerance;Impaired balance (sitting and/or standing);Decreased coordination;Decreased safety awareness;Impaired UE functional use;Decreased knowledge of precautions;Decreased knowledge of use of DME or AE   OT Treatment/Interventions: Self-care/ADL training;Neuromuscular education;Energy  conservation;DME and/or AE instruction;Splinting;Therapeutic activities;Visual/perceptual remediation/compensation;Patient/family education;Balance training      OT Goals(Current goals can be found in the care plan section)   Acute Rehab OT Goals OT Goal Formulation: With patient Time For Goal Achievement: 02/04/24 Potential to Achieve Goals: Good   OT Frequency:  Min 2X/week       AM-PAC OT 6 Clicks Daily Activity     Outcome Measure Help from another person eating meals?: None Help from another person taking care of personal grooming?: None Help from another person toileting, which includes using toliet, bedpan, or urinal?: A Lot Help from another person bathing (including washing, rinsing, drying)?: A Lot Help from another person to put on and taking off regular upper body clothing?: A Little Help from another person to put on and taking off regular lower body clothing?: A Lot 6 Click Score: 17   End of Session Equipment Utilized During Treatment: Oxygen  Nurse Communication: Mobility status  Activity Tolerance: Patient tolerated treatment well Patient left: in chair;with chair alarm set;with nursing/sitter in room  OT Visit Diagnosis: Muscle weakness (generalized) (M62.81);Repeated falls (R29.6);History of falling (Z91.81);Unsteadiness on feet (R26.81)                Time: 8980-8955 OT Time Calculation (min): 25 min Charges:  OT General Charges $OT Visit: 1 Visit OT Evaluation $OT Eval Moderate Complexity: 1 Mod OT Treatments $Self Care/Home Management : 8-22 mins  .Sunshine Mackowski L. Ekta Dancer, OTR/L  01/21/24, 3:36 PM

## 2024-01-21 NOTE — Assessment & Plan Note (Signed)
 Last A1c of 5.1.  Patient was not on any medications at home. -Carb modified diet -Monitor blood glucose

## 2024-01-21 NOTE — Plan of Care (Signed)

## 2024-01-21 NOTE — Assessment & Plan Note (Signed)
 Chronic leg wound. Patient met sepsis criteria with fever and tachycardia, imaging concerning for cellulitis, no osseous abnormality. Prior wound cultures with Serratia and methicillin-resistant Staphylococcus cohnii - UA also concerning for UTI. - Continue with cefepime  and vancomycin  -Follow-up cultures

## 2024-01-21 NOTE — Assessment & Plan Note (Signed)
 Clinically appears euvolemic. - Holding home diuretic due to AKI -Monitor volume status closely

## 2024-01-21 NOTE — Assessment & Plan Note (Signed)
 Continue PPI.

## 2024-01-21 NOTE — Assessment & Plan Note (Signed)
 Likely multifactorial as patient was on high doses of opioids, denied taking any extra pill but noted to have pinpoint pupil on admission. Patient concerned that she had left-sided weakness few days prior and concern about stroke, CT head was negative for any acute abnormality. - MRI brain ordered Mentation improved and appears to be at baseline now. - Holding morphine  Continue with as needed oxycodone - -follow-up MRI brain

## 2024-01-21 NOTE — Evaluation (Signed)
 Clinical/Bedside Swallow Evaluation Patient Details  Name: Tricia [name] MRN: [id number] Date of Birth: 02/21/1960  Today's Date: 01/21/2024 Time: SLP Start Time (ACUTE ONLY): 1100 SLP Stop Time (ACUTE ONLY): 1140 SLP Time Calculation (min) (ACUTE ONLY): 40 min  Past Medical History:  Past Medical History:  Diagnosis Date   Anxiety    Arthritis    joints and hands/ knees   Asthma    uses inhaler   Benign essential tremor    head   Cervical dystonia    neck pain   Cholesteatoma of left ear    x2   COPD (chronic obstructive pulmonary disease) (HCC)    Cough    Depression    Diabetes mellitus without complication (HCC)    type 2   Diastolic dysfunction    Dyspnea    Dysrhythmia    diastolic dysfunction   GERD (gastroesophageal reflux disease)    Headache    migraines/ one per week   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   Hyperlipidemia    Hypertension    Motion sickness    boat   Neuromuscular disorder (HCC)    neuropathy feet and hands( nerve damage)   Wears dentures    upper and lower   Past Surgical History:  Past Surgical History:  Procedure Laterality Date   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   Procedure: AMPUTATION, TOE;  Surgeon: Lennie Barter, DPM;  Location: [facility];  Service: Orthopedics/Podiatry;  Laterality: Left;  2nd Toe amputation   CARPAL TUNNEL RELEASE Bilateral    x2 right, 1x on left   COLONOSCOPY     COLONOSCOPY WITH PROPOFOL  N/A 04/25/2017   Procedure: COLONOSCOPY WITH PROPOFOL ;  Surgeon: Jinny Carmine, MD;  Location: [facility];  Service: Endoscopy;  Laterality: N/A;  diabetic-oral med   DILATION AND CURETTAGE OF UTERUS     ESOPHAGOGASTRODUODENOSCOPY (EGD) WITH PROPOFOL  N/A 01/10/2021   Procedure: ESOPHAGOGASTRODUODENOSCOPY (EGD) WITH PROPOFOL ;  Surgeon: Janalyn Keene NOVAK, MD;  Location: [facility];  Service: Endoscopy;  Laterality: N/A;  Diabetic   EXTERNAL EAR SURGERY Left    x2   IRRIGATION AND  DEBRIDEMENT FOOT Right 11/14/2023   Procedure: IRRIGATION AND DEBRIDEMENT FOOT;  Surgeon: Lennie Barter, DPM;  Location: [facility];  Service: Orthopedics/Podiatry;  Laterality: Right;   POLYPECTOMY  04/25/2017   Procedure: POLYPECTOMY INTESTINAL;  Surgeon: Jinny Carmine, MD;  Location: [facility];  Service: Endoscopy;;   SPINE SURGERY     herniated disc   TUBAL LIGATION     WOUND DEBRIDEMENT Bilateral 11/14/2023   Procedure: DEBRIDEMENT, WOUND;  Surgeon: Lennie Barter, DPM;  Location: [facility];  Service: Orthopedics/Podiatry;  Laterality: Bilateral;   HPI:  Pt is a 63 y.o. female admitted with AMS, sepsis, Malnutrition per Dietician, MDD, anterior right shin wound infection, acute hypoxic respiratory failure, AKI. 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  Also, pt has a Chronic wound on anterior right shin, foot.  There was a dirty dressing on it on arrival.  Per MD, patient was on significant higher doses of Opioids at home.  Holding home morphine  and continuing as needed oxycodone ..   Per ED note, there is concern reported of her care in the home.  CXR at admit: Interval resolution of right pleural effusion with improved aeration  of both  lung bases. No acute cardiopulmonary process.  OF NOTE: Pt has had 5 admits since July 2025 per chart.     Assessment / Plan / Recommendation  Clinical Impression   Pt seen today for BSE and initiation of diet. Pt was A/O x3; verbal and engaged in general conversation w/ this SLP and NSG staff. Pt sitting in chair. She followed 1step commands appropriately. She is without her Dentures- at home. She stated she eats with and without them but has trouble eating steak w/out them. Endorses not using them to eat unless going out to eat at a  previous admit.  Pt denied any other difficulty swallowing otherwise. Pt fed self w/ Min setup and positioning support in chair. Pt was distracted by her sore feet/LEs; elevated on pillow.  Has been on [place] O2 support, afebrile. WBC wnl.   Pt endorsed poor appetite-  I really don't feel like much to eat but eats snacks. Discussed finger foods/easy to eat foods and ways to moisten/soften them d/t Edentulous status and for conservation of energy.    Pt appears to present w/ functional oropharyngeal phase swallowing w/ No overt oropharyngeal phase dysphagia noted, No neuromuscular deficits noted. Pt consumed po trials w/ No overt, clinical s/s of aspiration during the po trials. Pt appears at reduced risk for aspiration when following general aspiration precautions.  However, pt does have challenging factors that could impact her oropharyngeal swallowing to include Pain/discomfort in legs(NSG aware), Opoid medications and drowsiness, Parkinson's Dis. Baseline, deconditioning/weakness, and another acute illness/hospitalization. Pt is Edentulous. These factors can increase risk for dysphagia as well as decreased oral intake overall.    During po trials, pt fed self and consumed po's w/ no overt coughing, decline in vocal quality, or change in respiratory presentation during/post trials. No decline in O2 sats- 98% when checked. Oral phase appeared grossly [facility] w/ timely bolus management, mashing/gumming of softened solids, and control of bolus propulsion for A-P transfer for swallowing. Oral clearing achieved w/ all trial consistencies -- moistened trials given. Pt fed self w/ setup support.    Recommend a Regular (Mech Soft meats) diet w/ well-moistened foods -- this will allow choices/options, and pt agreed to chop it for ease of mashing/gumming; Thin liquids -- monitor sitting up for oral intake, straw use-- pt should Hold Cup when drinking. Recommend general aspiration precautions, tray setup and sitting up  support for all po's. Small bites/sips. Pills WHOLE in Puree for safer, easier swallowing. REFLUX precautions.  Education given on Pills in Puree; food consistencies and easy to eat options; general aspiration precautions to pt.  Per previous admits, pt appears at/near her Baseline. No further skilled ST services indicated at this time; MD to reconsult if any new needs during admit. NSG updated, agreed. MD updated. Recommend Dietician f/u for support. Precautions posted in chart, room. Recommend Palliative Care f/u for GOC.  SLP Visit Diagnosis: Dysphagia, unspecified (R13.10) (baseline COPD, GERD, and deconditoning/weakness)    Aspiration Risk   (reduced following general aspiration precautions)    Diet Recommendation   Thin;Age appropriate regular (Cut meats, moistened foods d/t lacking Dentures) = a Regular (Mech Soft meats) diet w/ well-moistened foods -- this will allow choices/options, and pt agreed to chop it for ease of mashing/gumming; Thin liquids -- monitor sitting up for oral intake, straw use-- pt should Hold Cup when drinking. Recommend general aspiration precautions, tray setup and sitting up support for all po's. Small bites/sips.   Medication Administration: Whole meds with puree (for ease/safety  of swallow)    Other Recommendations Recommended Consults:  (Dietician; Palliative Care for GOC) Oral Care Recommendations: Oral care BID;Patient independent with oral care (Denture care- does not have dentures present)     Swallow Evaluation Recommendations  Setup support   Assistance Recommended at Discharge   Support at meals at D/C  Functional Status Assessment Patient has not had a recent decline in their functional status  Frequency and Duration  (n/a)   (n/a)       Prognosis Prognosis for improved oropharyngeal function: Fair (-Good) Barriers to Reach Goals: Time post onset;Severity of deficits;Behavior (not wearing Dentures currenlty) Barriers/Prognosis Comment: not wearing  Dentures currently; Chronic comorbidities      Swallow Study   General Date of Onset: 01/20/24 HPI: Pt is a 63 y.o. female admitted with AMS, sepsis, Malnutrition per Dietician, MDD, anterior right shin wound infection, acute hypoxic respiratory failure, AKI. 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  Also, pt has a Chronic wound on anterior right shin, foot.  There was a dirty dressing on it on arrival.  Per MD, patient was on significant higher doses of Opioids at home.  Holding home morphine  and continuing as needed oxycodone ..   Per ED note, there is concern reported of her care in the home.  CXR at admit: Interval resolution of right pleural effusion with improved aeration  of both lung bases. No acute cardiopulmonary process.  OF NOTE: Pt has had 5 admits since July 2025 per chart. Type of Study: Bedside Swallow Evaluation Previous Swallow Assessment: MBSS 10/2023- min oropharyngeal phase dysphagia w/ recommendation for regular diet, thins and precautions Diet Prior to this Study: NPO (regular diet at home) Temperature Spikes Noted: No (wbc 4.3) Respiratory Status: Nasal cannula (4L last evening) History of Recent Intubation: No Behavior/Cognition: Alert;Cooperative;Pleasant mood Oral Cavity Assessment: Within Functional Limits Oral Care Completed by SLP: Recent completion by staff Oral Cavity - Dentition: Edentulous (Dentures at home) Vision: Functional for self-feeding Self-Feeding Abilities: Able to feed self;Needs set up Patient Positioning: Upright in chair Baseline Vocal Quality: Normal;Low vocal intensity Volitional Cough: Strong Volitional Swallow: Able to elicit    Oral/Motor/Sensory Function Overall Oral Motor/Sensory Function: Within functional limits   Ice Chips  Ice chips: Not tested   Thin Liquid Thin Liquid: Within functional limits Presentation: Self Fed;Straw (~6 ozs) Other Comments: water , juice    Nectar Thick Nectar Thick Liquid: Not tested   Honey Thick Honey Thick Liquid: Not tested   Puree Puree: Within functional limits Presentation: Self Fed;Spoon (~3 ozs)   Solid     Solid: Within functional limits (cut small and moistened in puree) Presentation: Self Fed;Spoon (10 trials)         Comer Portugal, MS, CCC-SLP Speech Language Pathologist Rehab Services; [facility] [place] [phone] (ascom) Casha Estupinan 01/21/2024,4:10 PM

## 2024-01-21 NOTE — Assessment & Plan Note (Signed)
 Continue home Sinemet.

## 2024-01-21 NOTE — Evaluation (Signed)
 Physical Therapy Evaluation Patient Details Name: Tricia [name] MRN: [id number] DOB: 1960/05/23 Today's Date: 01/21/2024  History of Present Illness  63 y/o female presented to ED on 01/20/24 for AMS and SOB. Recently admitted 10/2023 for bacteremia due to chronic wound on anterior R shin. Admitted now for sepsis and COPD exacerbation. PMH: anxiety, asthma, COPD, HTN  Clinical Impression  Patient admitted with the above.  PTA, patient lives with son in law (who is a it trainer and majority of the time, gone) and reports she uses electric w/c in the home and finally reports after questioning that she ambulates into home up 4 stairs and into house to w/c across the home. Patient on 4L O2 [place] with spO2 >98%. Removed O2 for duration of session with spO2 >91%. Able to complete bed mobility and sit to stand with CGA. Transferred to [facility] with no AD and CGA. Successful void and BM but required assist for pericare. Ambulated around bed to recliner with CGA-minA and HHAx1. Unsteady with narrow BOS. Reports concern of R hand (visible wrist drop), OT notified MD. Noted sutures present (embedded into skin) on bottom of R foot and L toe amputation site. Patient reports she was unable to attend follow up appointment 2/2 lack of transportation. Patient will benefit from skilled PT services during acute stay to address listed deficits. Patient will benefit from ongoing therapy at discharge to maximize functional independence and safety.          If plan is discharge home, recommend the following: A little help with walking and/or transfers;A little help with bathing/dressing/bathroom;Assistance with cooking/housework;Assist for transportation;Help with stairs or ramp for entrance   Can travel by private vehicle   Yes    Equipment Recommendations Rolling Morgan Rennert (2 wheels)  Recommendations for Other Services       Functional Status Assessment Patient has had a recent decline in their functional status and  demonstrates the ability to make significant improvements in function in a reasonable and predictable amount of time.     Precautions / Restrictions Precautions Precautions: Fall Recall of Precautions/Restrictions: Impaired Restrictions Weight Bearing Restrictions Per Provider Order: No      Mobility  Bed Mobility Overal bed mobility: Needs Assistance Bed Mobility: Supine to Sit     Supine to sit: Contact guard          Transfers Overall transfer level: Needs assistance Equipment used: None Transfers: Sit to/from Stand, Bed to chair/wheelchair/BSC Sit to Stand: Contact guard assist   Step pivot transfers: Contact guard assist            Ambulation/Gait Ambulation/Gait assistance: Contact guard assist, Min assist Gait Distance (Feet): 12 Feet Assistive device: 1 person hand held assist Gait Pattern/deviations: Step-to pattern, Decreased stride length, Narrow base of support Gait velocity: decreased     General Gait Details: unsteady with narrow BOS. CGA-minA for balance  Stairs            Wheelchair Mobility     Tilt Bed    Modified Rankin (Stroke Patients Only)       Balance Overall balance assessment: Needs assistance Sitting-balance support: No upper extremity supported, Feet supported Sitting balance-Leahy Scale: Good     Standing balance support: Single extremity supported, During functional activity Standing balance-Leahy Scale: Fair                               Pertinent Vitals/Pain Pain Assessment Pain Assessment: No/denies pain  Home Living Family/patient expects to be discharged to:: Private residence Living Arrangements: Other relatives (son in law) Available Help at Discharge: Available PRN/intermittently Type of Home: House Home Access: Stairs to enter Entrance Stairs-Rails: Lawyer of Steps: 4   Home Layout: One level Home Equipment: Agricultural Consultant (2 wheels);Shower  seat;Wheelchair - power      Prior Function Prior Level of Function : Patient poor historian/Family not available             Mobility Comments: per patient, she uses electric w/c in the house (unable to get out of the house with w/c). Ambulatory outside of home with no AD. Unsure of accuracy as patient is poor historian ADLs Comments: Pt. reports independence with ADLs, and IADLs     Extremity/Trunk Assessment   Upper Extremity Assessment Upper Extremity Assessment: Defer to OT evaluation    Lower Extremity Assessment Lower Extremity Assessment: Generalized weakness (sutures present from 10/2023 (per patient) in bottom of L foot and R toe amputation site. Patient reports inability to get to follow up to have suture removed)    Cervical / Trunk Assessment Cervical / Trunk Assessment: Normal  Communication   Communication Communication: No apparent difficulties    Cognition Arousal: Alert Behavior During Therapy: WFL for tasks assessed/performed   PT - Cognitive impairments: No family/caregiver present to determine baseline                       PT - Cognition Comments: oriented to self, place, and time. Decreased awareness into situation and deficits. Following commands: Impaired Following commands impaired: Follows one step commands with increased time     Cueing Cueing Techniques: Verbal cues, Gestural cues     General Comments General comments (skin integrity, edema, etc.): On 4L O2 on arrival, spO2 >98%. Doffed for session with spO2>92% throughout on RA    Exercises     Assessment/Plan    PT Assessment Patient needs continued PT services  PT Problem List Decreased strength;Decreased activity tolerance;Decreased mobility;Decreased balance;Decreased cognition;Decreased knowledge of use of DME;Decreased safety awareness;Decreased knowledge of precautions;Cardiopulmonary status limiting activity;Pain       PT Treatment Interventions DME  instruction;Gait training;Stair training;Functional mobility training;Therapeutic activities;Therapeutic exercise;Balance training;Neuromuscular re-education;Patient/family education    PT Goals (Current goals can be found in the Care Plan section)  Acute Rehab PT Goals Patient Stated Goal: did not state PT Goal Formulation: With patient Time For Goal Achievement: 02/04/24 Potential to Achieve Goals: Fair    Frequency Min 2X/week     Co-evaluation               AM-PAC PT 6 Clicks Mobility  Outcome Measure Help needed turning from your back to your side while in a flat bed without using bedrails?: A Little Help needed moving from lying on your back to sitting on the side of a flat bed without using bedrails?: A Little Help needed moving to and from a bed to a chair (including a wheelchair)?: A Little Help needed standing up from a chair using your arms (e.g., wheelchair or bedside chair)?: A Little Help needed to walk in hospital room?: A Little Help needed climbing 3-5 steps with a railing? : A Lot 6 Click Score: 17    End of Session   Activity Tolerance: Patient tolerated treatment well Patient left: in chair;with call bell/phone within reach;with chair alarm set (handoff to OT) Nurse Communication: Mobility status PT Visit Diagnosis: Unsteadiness on feet (R26.81);Muscle weakness (generalized) (M62.81);Other abnormalities  of gait and mobility (R26.89);History of falling (Z91.81)    Time: 8992-8968 PT Time Calculation (min) (ACUTE ONLY): 24 min   Charges:   PT Evaluation $PT Eval Moderate Complexity: 1 Mod PT Treatments $Therapeutic Activity: 8-22 mins PT General Charges $$ ACUTE PT VISIT: 1 Visit         Maryanne Finder, PT, DPT Physical Therapist - [facility]  [facility]   Kolbee Bogusz A Lisle Skillman 01/21/2024, 12:48 PM

## 2024-01-21 NOTE — Consult Note (Signed)
 Pharmacy Antibiotic Note  Tricia [name] is a 63 y.o. female admitted on 01/20/2024 with sepsis.  Pharmacy has been consulted for vancomycin  dosing. Scr is still elevated from baseline.   Plan: Vancomycin  1500 mg x 1 loading dose given yesterday. Continue to dose by level given Scr is still elevated from baseline. Random level this morning 12 mcg/ml. Will give vancomycin  750 mg IV x 1. Will order a 24 hour level and follow up with Scr in the AM.    Height: 5' 8 (172.7 cm) Weight: 60 kg (132 lb 4.4 oz) IBW/kg (Calculated) : 63.9  Temp (24hrs), Avg:98.3 F (36.8 C), Min:97.4 F (36.3 C), Max:100.9 F (38.3 C)  Recent Labs  Lab 01/20/24 1356 01/20/24 1430 01/20/24 2059 01/21/24 0425 01/21/24 0919  WBC 5.8  --  3.8* 4.3  --   CREATININE 2.41*  --  2.30* 2.16*  --   LATICACIDVEN  --  1.6  --   --   --   VANCORANDOM  --   --  17  --  12    Estimated Creatinine Clearance: 25.3 mL/min (A) (by C-G formula based on SCr of 2.16 mg/dL (H)).    Allergies  Allergen Reactions   Augmentin  [Amoxicillin -Pot Clavulanate] Diarrhea   Penicillins Itching    Antimicrobials this admission: 12/1 vancomycin  >>  12/1 cefepime  >>   Dose adjustments this admission: None  Microbiology results: 12/1 BCx: in process.    Thank you for allowing pharmacy to be a part of this patient's care.  Lum VEAR Mania, PharmD, BCPS 01/21/2024 10:19 AM

## 2024-01-21 NOTE — Progress Notes (Signed)
 Progress Note   Patient: Tricia Tricia [name] FMW:[id number] DOB: 01-10-1961 DOA: 01/20/2024     1 DOS: the patient was seen and examined on 01/21/2024   Brief hospital course: Partly taken from H&P.  Tricia Tricia [name] is a 63 y.o. female with medical history significant of anxiety, osteoarthritis, asthma, COPD,  GERD, dyslipidemia, and HTN presents to the emergency department via EMS from home with concern of worsening confusion.  Per EMS patient was wheezing and shortness of breath requiring Solu-Medrol  and DuoNeb. Unable to provide any other history.  On presentation febrile at 100.9, sinus tachycardia, saturating appropriately on 4 L of oxygen .  Labs with no leukocytosis, BUN of 28, creatinine 2.41 with baseline around 1.  Troponin peaked at 164, BNP greater than 16,000.  She has a fairly unremarkable echo in July 2025. Chest x-ray of was negative for any acute abnormality.  COVID/flu/RSV negative.  TB and fibula imaging with soft Tricia [name] edema, no osseous lesion.  Recent hospitalization in September 2025 secondary to bacteremia with a chronic wound on anterior right shin.  There was a dirty dressing on it on arrival.  Patient was started on cefepime  and vancomycin .  12/2: Vital stable, hemoglobin at 7.5 which is close to her baseline.  CO2 20, BUN 31, creatinine 2.16. Preliminary blood cultures negative, anemia panel in September 2025 with anemia of chronic disease and no obvious deficiency. Concern of urinary retention, she was complaining of dribbling of urine, significant postvoid residual volume, had one-time In-N-Out catheter. Currently encouraging voiding as dribbling might be due to overflow incontinence.  If she is unable to empty her bladder she might need Foley catheter, currently does not want Foley.  Mentation now at baseline, patient was on significant higher doses of opioids at home.  Holding home morphine  and continuing as needed oxycodone .  Pinpoint pupil was noted on  admission.  UA concerning for UTI-urine cultures ordered as add-on.  UDS positive for opioids.  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  Told nurse to remove the stitches.  Assessment and Plan: * Sepsis due to cellulitis (HCC) Chronic leg wound. Patient met sepsis criteria with fever and tachycardia, imaging concerning for cellulitis, no osseous abnormality. Prior wound cultures with Serratia and methicillin-resistant Staphylococcus cohnii - UA also concerning for UTI. - Continue with cefepime  and vancomycin  -Follow-up cultures  AMS (altered mental status) Likely multifactorial as patient was on high doses of opioids, denied taking any extra pill but noted to have pinpoint pupil on admission. Patient concerned that she had left-sided weakness few days prior and concern about stroke, CT head was negative for any acute abnormality. - MRI brain ordered Mentation improved and appears to be at baseline now. - Holding morphine  Continue with as needed oxycodone - -follow-up MRI brain   Acute kidney injury superimposed on chronic kidney disease Urinary retention.  History of underlying CKD stage III A. Patient complained about dribbling urine which can be due to overflow incontinence as she was found to have significant postvoid bladder volume requiring one-time In-N-Out catheter with removal of about a liter of urine. Does not want Foley catheter at this time. - Creatinine at 2.16 today -Monitor renal function -Monitor postvoid bladder volume - Avoid nephrotoxins  Elevated troponin No chest pain, likely secondary to demand ischemia.  Echo done in July 2025 was fairly unremarkable. - Repeat echo done-pending results  Chronic obstructive pulmonary disease (COPD) (HCC) Patient was initially placed  on 4 L of oxygen  in ED with no documented hypoxia.  Chest x-ray was negative for any acute  abnormality. -Try weaning from oxygen  -No wheezing -Continue with home bronchodilators  Diastolic dysfunction Clinically appears euvolemic. - Holding home diuretic due to AKI -Monitor volume status closely  Parkinson's disease (HCC) - Continue home Sinemet   Diabetes mellitus without complication (HCC) Last A1c of 5.1.  Patient was not on any medications at home. -Carb modified diet -Monitor blood glucose  GERD (gastroesophageal reflux disease) - Continue PPI   Subjective: Patient was sitting in chair comfortably when seen today.  When asked about any urinary symptoms stating that she has a lot of dribbling, does not feel when she is starting having some dribbling of urine.  Per patient she has an history of diabetes but does not take any medications. She denies any extra pain medications but does take a lot of opioids.  Worried about sutures on the left foot as she did not follow-up with podiatry after September as recommended.  Physical Exam: Vitals:   01/21/24 0245 01/21/24 0458 01/21/24 0731 01/21/24 1144  BP: (!) 106/57 110/60 122/67 123/60  Pulse: 80 82 87   Resp: 12 12 17 18   Temp: 97.6 F (36.4 C) (!) 97.5 F (36.4 C) (!) 97.5 F (36.4 C) (!) 97.4 F (36.3 C)  TempSrc: Oral Oral Oral   SpO2: 100% 100% 100% 100%  Weight:   60 kg   Height:   5' 8 (1.727 m)    General.  Frail lady, in no acute distress. Pulmonary.  Lungs clear bilaterally, normal respiratory effort. CV.  Regular rate and rhythm, no JVD, rub or murmur. Abdomen.  Soft, nontender, nondistended, BS positive. CNS.  Alert and oriented .  No focal neurologic deficit. Extremities.  Bilateral lower extremities with some erythema, clean-based wound on right with bandages.  Left second toe with clean healed wound with sutures on. Psychiatry.  Judgment and insight appears normal.   Data Reviewed: Prior data reviewed  Family Communication: Talked with granddaughter on phone.  Disposition: Status is:  Inpatient Remains inpatient appropriate because: Severity of illness  Planned Discharge Destination: Home with Home Health  DVT prophylaxis. Lovenox  Time spent: 50 minutes  This record has been created using Conservation officer, historic buildings. Errors have been sought and corrected,but may not always be located. Such creation errors do not reflect on the standard of care.   Author: Amaryllis Dare, MD 01/21/2024 2:25 PM  For on call review [email or web address].

## 2024-01-21 NOTE — Assessment & Plan Note (Signed)
 No chest pain, likely secondary to demand ischemia.  Echo done in July 2025 was fairly unremarkable. - Repeat echo done-pending results

## 2024-01-21 NOTE — Plan of Care (Signed)
  Problem: Clinical Measurements: Goal: Ability to maintain clinical measurements within normal limits will improve Outcome: Progressing Goal: Diagnostic test results will improve Outcome: Progressing   Problem: Activity: Goal: Risk for activity intolerance will decrease Outcome: Progressing   Problem: Coping: Goal: Level of anxiety will decrease Outcome: Progressing

## 2024-01-21 NOTE — Assessment & Plan Note (Addendum)
 Patient was initially placed on 4 L of oxygen  in ED with no documented hypoxia.  Chest x-ray was negative for any acute abnormality. -Try weaning from oxygen  -No wheezing -Continue with home bronchodilators

## 2024-01-21 NOTE — Progress Notes (Signed)
 All stitches removed from rt foot. There are still two stiches in left toe that were too deep and covered by skin that I could not remove. MD asked to please take a look at these tomorrow.

## 2024-01-21 NOTE — Hospital Course (Addendum)
 Partly taken from H&P.  Tricia [name] is a 63 y.o. female with medical history significant of anxiety, osteoarthritis, asthma, COPD,  GERD, dyslipidemia, and HTN presents to the emergency department via EMS from home with concern of worsening confusion.  Per EMS patient was wheezing and shortness of breath requiring Solu-Medrol  and DuoNeb. Unable to provide any other history.  On presentation febrile at 100.9, sinus tachycardia, saturating appropriately on 4 L of oxygen .  Labs with no leukocytosis, BUN of 28, creatinine 2.41 with baseline around 1.  Troponin peaked at 164, BNP greater than 16,000.  She has a fairly unremarkable echo in July 2025. Chest x-ray of was negative for any acute abnormality.  COVID/flu/RSV negative.  TB and fibula imaging with soft tissue edema, no osseous lesion.  Recent hospitalization in September 2025 secondary to bacteremia with a chronic wound on anterior right shin.  There was a dirty dressing on it on arrival.  Patient was started on cefepime  and vancomycin .  12/2: Vital stable, hemoglobin at 7.5 which is close to her baseline.  CO2 20, BUN 31, creatinine 2.16. Preliminary blood cultures negative, anemia panel in September 2025 with anemia of chronic disease and no obvious deficiency. Concern of urinary retention, she was complaining of dribbling of urine, significant postvoid residual volume, had one-time In-N-Out catheter. Currently encouraging voiding as dribbling might be due to overflow incontinence.  If she is unable to empty her bladder she might need Foley catheter, currently does not want Foley.  Mentation now at baseline, patient was on significant higher doses of opioids at home.  Holding home morphine  and continuing as needed oxycodone .  Pinpoint pupil was noted on admission.  UA concerning for UTI-urine cultures ordered as add-on.  UDS positive for opioids.  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  Told nurse to remove the stitches.

## 2024-01-21 NOTE — Assessment & Plan Note (Signed)
 Urinary retention.  History of underlying CKD stage III A. Patient complained about dribbling urine which can be due to overflow incontinence as she was found to have significant postvoid bladder volume requiring one-time In-N-Out catheter with removal of about a liter of urine. Does not want Foley catheter at this time. - Creatinine at 2.16 today -Monitor renal function -Monitor postvoid bladder volume - Avoid nephrotoxins

## 2024-01-21 NOTE — Progress Notes (Signed)
*  PRELIMINARY RESULTS* Echocardiogram 2D Echocardiogram has been performed.  Tricia [name] 01/21/2024, 1:44 PM

## 2024-01-22 DIAGNOSIS — L0390 Cellulitis, unspecified: Secondary | ICD-10-CM

## 2024-01-22 DIAGNOSIS — A419 Sepsis, unspecified organism: Secondary | ICD-10-CM | POA: Diagnosis not present

## 2024-01-22 LAB — RENAL FUNCTION PANEL
Albumin: 2.6 g/dL — ABNORMAL LOW (ref 3.5–5.0)
Anion gap: 9 (ref 5–15)
BUN: 22 mg/dL (ref 8–23)
CO2: 21 mmol/L — ABNORMAL LOW (ref 22–32)
Calcium: 7.5 mg/dL — ABNORMAL LOW (ref 8.9–10.3)
Chloride: 102 mmol/L (ref 98–111)
Creatinine, Ser: 1.55 mg/dL — ABNORMAL HIGH (ref 0.44–1.00)
GFR, Estimated: 37 mL/min — ABNORMAL LOW (ref >60)
Glucose, Bld: 143 mg/dL — ABNORMAL HIGH (ref 70–99)
Phosphorus: 2.1 mg/dL — ABNORMAL LOW (ref 2.5–4.6)
Potassium: 3.5 mmol/L (ref 3.5–5.1)
Sodium: 132 mmol/L — ABNORMAL LOW (ref 135–145)

## 2024-01-22 LAB — URINE CULTURE

## 2024-01-22 LAB — VANCOMYCIN, RANDOM: Vancomycin Rm: 13 ug/mL

## 2024-01-22 LAB — C-REACTIVE PROTEIN: CRP: 2.9 mg/dL — ABNORMAL HIGH (ref <1.0)

## 2024-01-22 LAB — MRSA NEXT GEN BY PCR, NASAL: MRSA by PCR Next Gen: DETECTED — AB

## 2024-01-22 MED ORDER — SODIUM CHLORIDE 0.9 % IV SOLN
2.0000 g | Freq: Two times a day (BID) | INTRAVENOUS | Status: DC
  Administered 2024-01-22 – 2024-01-26 (×9): 2 g via INTRAVENOUS
  Filled 2024-01-22 (×10): qty 12.5

## 2024-01-22 MED ORDER — VITAMIN C 500 MG PO TABS
500.0000 mg | ORAL_TABLET | Freq: Two times a day (BID) | ORAL | Status: DC
  Administered 2024-01-22 – 2024-01-26 (×9): 500 mg via ORAL
  Filled 2024-01-22 (×9): qty 1

## 2024-01-22 MED ORDER — ZINC SULFATE 220 (50 ZN) MG PO CAPS
220.0000 mg | ORAL_CAPSULE | Freq: Every day | ORAL | Status: DC
  Administered 2024-01-22 – 2024-01-26 (×5): 220 mg via ORAL
  Filled 2024-01-22 (×5): qty 1

## 2024-01-22 MED ORDER — SODIUM CHLORIDE 0.9% FLUSH
10.0000 mL | Freq: Two times a day (BID) | INTRAVENOUS | Status: DC
  Administered 2024-01-22 – 2024-01-25 (×6): 10 mL
  Administered 2024-01-26: 20 mL

## 2024-01-22 MED ORDER — SODIUM CHLORIDE 0.9% FLUSH: 10.0000 mL | INTRAVENOUS | Status: DC | PRN

## 2024-01-22 MED ORDER — ENOXAPARIN SODIUM 40 MG/0.4ML IJ SOSY
40.0000 mg | PREFILLED_SYRINGE | INTRAMUSCULAR | Status: DC
  Administered 2024-01-22 – 2024-01-25 (×4): 40 mg via SUBCUTANEOUS
  Filled 2024-01-22 (×4): qty 0.4

## 2024-01-22 MED ORDER — VANCOMYCIN HCL 750 MG/150ML IV SOLN
750.0000 mg | INTRAVENOUS | Status: DC
  Administered 2024-01-22: 750 mg via INTRAVENOUS
  Filled 2024-01-22: qty 150

## 2024-01-22 NOTE — Progress Notes (Signed)
   01/22/24 1300  Spiritual Encounters  Type of Visit Initial  Care provided to: Patient  Conversation partners present during encounter Nurse  Referral source Nurse (RN/NT/LPN)  Reason for visit End-of-life  OnCall Visit No  Spiritual Framework  Presenting Themes Significant life change;Caregiving needs;Coping tools  Community/Connection Limited  Patient Stress Factors Exhausted;Family relationships;Financial concerns;Health changes;Lack of caregivers  Family Stress Factors Exhausted;Family relationships;Health changes  Advance Directives (For Healthcare)  Does Patient Have a Medical Advance Directive? No  Would patient like information on creating a medical advance directive? No - Patient declined  Mental Health Advance Directives  Does Patient Have a Mental Health Advance Directive? No  Would patient like information on creating a mental health advance directive? No - Patient declined   Chaplain met with pt to discuss life changes and care once released. Patient did not want to discuss illness. Very concerned about housing after release for hospital. Offered care and compassion as patient searches for more harmonious living arrangement.   Cortnee Steinmiller Chaplain Intern-[facility]

## 2024-01-22 NOTE — Progress Notes (Signed)
 The above named patient is recommended to go to Short Term Rehab for strengthening and gait training for balance.  It is expected that the Short Term Rehab stay will be less than 30 days.  The patient is expected to return home after Rehab.

## 2024-01-22 NOTE — Progress Notes (Addendum)
 PROGRESS NOTE Tricia [name]    DOB: [birth year], 63 y.o.  FMW:[id number]    Code Status: Full Code   DOA: 01/20/2024   LOS: 2  Brief hospital course  Tricia [name] is a 63 y.o. female with a PMH significant for anxiety, osteoarthritis, asthma, COPD, GERD, dyslipidemia, and HTN.  Presents with SOB and wheezing. Also with AMS, thought to be secondary to opioids.  Recent hospitalization in September 2025 secondary to bacteremia with a chronic wound on anterior right shin.  There was a dirty dressing on it on arrival.  On presentation febrile at 100.9, sinus tachycardia, saturating appropriately on 4 L of oxygen .  Labs with no leukocytosis, BUN of 28, creatinine 2.41 with baseline around 1.  Troponin peaked at 164, BNP greater than 16,000.  She has a fairly unremarkable echo in July 2025. Chest x-ray of was negative for any acute abnormality.  COVID/flu/RSV negative.  TB and fibula imaging with soft tissue edema, no osseous lesion.   Patient was started on cefepime  and vancomycin    01/22/24 -overnight, foley was placed due to urinary retention and bilateral hydronephrosis seen on renal US . SNF at dc. 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 Continues on Abx.  Chronic leg wound. Blood Cx NGTD. imaging concerning for cellulitis, no osseous abnormality. Prior wound cultures with Serratia and methicillin-resistant  Staphylococcus cohnii - Continue with cefepime , follow cultures - discontinue vancomycin  as no evidence of purulent collection. She is a MRSA colonizer with positive nare swab   AMS- resolved back to baseline. Brain MRI negative acute. Likely secondary to opioid use with constricted pupils and positive UDS on admission and resolved with holding of the meds.  - cautiously continue pain medications and other sedating medications   Acute kidney injury superimposed on chronic kidney disease Acute on chronic Urinary retention.  History of underlying CKD stage III A. Bilateral hydronephrosis seen on renal US .  - continue foley catheter. Consider voiding trial prior to dc vs at outpatient follow up with urology - Cr improving but not to baseline yet. Cr 2.16>1.55 - Avoid nephrotoxins   Elevated troponin No chest pain, likely secondary to demand ischemia.  Echo done in July 2025 was fairly unremarkable. - Repeat echo done- unremarkable    Chronic obstructive pulmonary disease (COPD) (HCC) Patient was initially placed on 4 L of oxygen  in ED with no documented hypoxia. She states that she intermittently uses O2 PRN 2-3L at baseline due to her COPD. She is currently ORA during our encounter.  Chest x-ray was negative for any acute abnormality. 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 Had 2 remaining sutures. Removed today. Last A1c of 5.1.  Patient was not on any medications at home. -Carb modified diet -Monitor blood glucose  GERD (gastroesophageal reflux disease) - Continue PPI  Body mass index is 20.11 kg/m.  VTE ppx: enoxaparin  (LOVENOX ) injection 30 mg Start: 01/20/24 2200  Diet:     Diet   Diet Carb Modified Fluid consistency: Thin; Room service appropriate? Yes with Assist    Consultants: None   Subjective 01/22/24    Pt reports feeling improved today. Denies abdominal pain. Asks about her imaging results and her generalized weakness which we discussed in relation to her infection.    Objective  Blood pressure 128/67, pulse 82, temperature 97.7 F (36.5 C), temperature source Oral, resp. rate 20, height 5' 8 (1.727 m), weight 60 kg, SpO2 100%.  Intake/Output Summary (Last 24 hours) at 01/22/2024 0800 Last data filed at 01/22/2024 0400 Gross per 24 hour  Intake 720.32 ml  Output 1825 ml  Net -1104.68 ml   Filed Weights   01/20/24 1350 01/21/24 0731  Weight: 60 kg 60 kg     Physical Exam:  General: awake, alert, NAD HEENT: atraumatic, clear conjunctiva, anicteric sclera, MMM, hearing grossly normal Respiratory: normal respiratory effort. Cardiovascular: extremities well perfused, quick capillary refill, normal S1/S2, RRR, no JVD, murmurs Gastrointestinal: soft, NT, ND Nervous: A&O x3. no gross focal neurologic deficits, normal speech Extremities: wound of R anterior shin and left ankle/foot as seen in clinical images. Removed 2 sutures from 2nd digit of R foot amputation site without immediate complications  Skin: dry, intact, normal temperature, normal color. No rashes, lesions or ulcers on exposed skin Psychiatry: normal mood, congruent affect  Labs   I have personally reviewed the following labs and imaging studies CBC    Component Value Date/Time   WBC 4.3 01/21/2024 0425   RBC 2.43 (L) 01/21/2024 0425   HGB 7.5 (L) 01/21/2024 0425   HGB 13.7 11/16/2013 2040   HCT 23.5 (L) 01/21/2024 0425   HCT 40.9 11/16/2013 2040   PLT 249 01/21/2024 0425   PLT 254 11/16/2013 2040   MCV 96.7 01/21/2024 0425   MCV 94 11/16/2013 2040   MCH 30.9 01/21/2024 0425   MCHC 31.9 01/21/2024 0425   RDW 16.0 (H) 01/21/2024 0425   RDW 14.0 11/16/2013 2040   LYMPHSABS 1.3 01/20/2024 1356   LYMPHSABS 2.7 11/16/2013 2040   MONOABS 0.4 01/20/2024 1356    MONOABS 0.6 11/16/2013 2040   EOSABS 0.0 01/20/2024 1356   EOSABS 0.1 11/16/2013 2040   BASOSABS 0.0 01/20/2024 1356   BASOSABS 0.1 11/16/2013 2040   BASOSABS 0 11/15/2011 1430      Latest Ref Rng & Units 01/21/2024    4:25 AM 01/20/2024    8:59 PM 01/20/2024    1:56 PM  BMP  Glucose 70 - 99 mg/dL 853   779   BUN 8 - 23 mg/dL 31   28   Creatinine 9.55 - 1.00 mg/dL 7.83  7.69  7.58   Sodium 135 - 145 mmol/L 137   134   Potassium 3.5 - 5.1 mmol/L 4.0   3.9   Chloride 98 - 111 mmol/L 105   103   CO2 22 - 32 mmol/L 20   19   Calcium  8.9 - 10.3 mg/dL 7.9   8.0     MR BRAIN WO CONTRAST Result Date: 01/21/2024 EXAM: MRI BRAIN WITHOUT CONTRAST 01/21/2024 03:23:01 PM TECHNIQUE: Multiplanar multisequence MRI of the head/brain was performed without the administration of intravenous contrast. COMPARISON: MR Head 10/20/2013. CLINICAL HISTORY: Neuro deficit, acute, stroke suspected. AMS with medical history significant of anxiety, osteoarthritis, asthma, COPD (chronic  obstructive pulmonary disease), GERD (gastroesophageal reflux disease), dyslipidemia, and HTN (hypertension). FINDINGS: LIMITATIONS/ARTIFACTS: Motion degraded study. BRAIN AND VENTRICLES: No acute infarct. No intracranial hemorrhage. No mass. No midline shift. No hydrocephalus. The sella is unremarkable. Normal flow voids. ORBITS: No acute abnormality. SINUSES AND MASTOIDS: No acute abnormality. BONES AND SOFT TISSUES: Normal marrow signal. No acute soft tissue abnormality. IMPRESSION: 1. No acute intracranial abnormality. 2. Motion degraded study. Electronically signed by: Franky Stanford MD 01/21/2024 10:08 PM EST RP Workstation: [id number]   US  RENAL Result Date: 01/21/2024 CLINICAL DATA:  Acute kidney injury. EXAM: RENAL / URINARY TRACT ULTRASOUND COMPLETE COMPARISON:  11/01/2023 FINDINGS: Right Kidney: Renal measurements: 10.4 x 4.9 x 4.4 cm = volume: 118 mL. New mild hydronephrosis. No focal lesions identified. Left Kidney: Renal  measurements: 11.7 x 5.1 x 3.9 cm = volume: 121 mL. Mild hydronephrosis. Small shadowing calculi again noted. Bladder: Very distended bladder contains some dependent debris. Other: None. IMPRESSION: 1. New mild bilateral hydronephrosis. 2. Very distended bladder contains some dependent debris. Cause of mild bilateral hydronephrosis may be secondary to bladder distension. Consider Foley bladder decompression. 3. Small left renal calculi again noted. Electronically Signed   By: Marcey Moan M.D.   On: 01/21/2024 17:30   ECHOCARDIOGRAM COMPLETE Result Date: 01/21/2024    ECHOCARDIOGRAM REPORT   Patient Name:   Tricia [name] Date of Exam: 01/21/2024 Medical Rec #:  [id number]              Height:       68.0 in Accession #:    [id number]             Weight:       132.3 lb Date of Birth:  Oct 29, 1960              BSA:          1.714 m Patient Age:    63 years               BP:           123/60 mmHg Patient Gender: F                      HR:           87 bpm. Exam Location:  [facility] Procedure: 2D Echo, Cardiac Doppler, Color Doppler, 3D Echo and Strain Analysis            (Both Spectral and Color Flow Doppler were utilized during            procedure). Indications:     Elevated troponin  History:         Patient has prior history of Echocardiogram examinations, most                  recent 08/30/2023. COPD; Risk Factors:Hypertension and Diabetes.  Sonographer:     Christopher Furnace Referring Phys:  [id number] DAVED BROCKS [facility] Diagnosing Phys: Lonni Hanson MD  Sonographer Comments: Global longitudinal strain was attempted. IMPRESSIONS  1. Left ventricular ejection fraction, by estimation, is 55 to 60%. Left ventricular ejection fraction by 3D volume is 52 %. The left ventricle has normal function. The left ventricle has no regional wall motion abnormalities. The left ventricular internal cavity size was mildly dilated. Left ventricular diastolic parameters were normal. The average left ventricular global longitudinal strain  is -10.9 %. The global longitudinal strain is abnormal.  2. Right ventricular systolic function is normal. The right ventricular size is mildly  enlarged. Mildly increased right ventricular wall thickness. There is severely elevated pulmonary artery systolic pressure.  3. The mitral valve is degenerative. Moderate mitral valve regurgitation. Mild mitral stenosis; mitral valve gradient may be accentuated by increased flow related to mitral regurgitation.  4. The aortic valve was not well visualized. Aortic valve regurgitation is not visualized. Mild aortic valve stenosis. Aortic valve area, by VTI measures 1.87 cm. Aortic valve mean gradient measures 11.0 mmHg.  5. Mild pulmonic stenosis.  6. The inferior vena cava is normal in size with <50% respiratory variability, suggesting right atrial pressure of 8 mmHg. FINDINGS  Left Ventricle: Left ventricular ejection fraction, by estimation, is 55 to 60%. Left ventricular ejection fraction by 3D volume is 52 %. The left ventricle has normal function. The left ventricle has no regional wall motion abnormalities. The average left ventricular global longitudinal strain is -10.9 %. Strain was performed and the global longitudinal strain is abnormal. The left ventricular internal cavity size was mildly dilated. There is no left ventricular hypertrophy. Left ventricular diastolic parameters were normal. Right Ventricle: The right ventricular size is mildly enlarged. Mildly increased right ventricular wall thickness. Right ventricular systolic function is normal. There is severely elevated pulmonary artery systolic pressure. The tricuspid regurgitant velocity is 3.80 m/s, and with an assumed right atrial pressure of 8 mmHg, the estimated right ventricular systolic pressure is 65.8 mmHg. Left Atrium: Left atrial size was normal in size. Right Atrium: Right atrial size was normal in size. Pericardium: There is no evidence of pericardial effusion. Mitral Valve: The mitral valve is  degenerative in appearance. There is mild thickening of the mitral valve leaflet(s). Moderate mitral valve regurgitation. Mild mitral valve stenosis. MV peak gradient, 11.3 mmHg. The mean mitral valve gradient is 6.0 mmHg. Tricuspid Valve: The tricuspid valve is normal in structure. Tricuspid valve regurgitation is mild. Aortic Valve: The aortic valve was not well visualized. Aortic valve regurgitation is not visualized. Mild aortic stenosis is present. Aortic valve mean gradient measures 11.0 mmHg. Aortic valve peak gradient measures 18.0 mmHg. Aortic valve area, by VTI  measures 1.87 cm. Pulmonic Valve: The pulmonic valve was not well visualized. Pulmonic valve regurgitation is not visualized. Mild pulmonic stenosis. Aorta: The aortic root is normal in size and structure. Pulmonary Artery: The pulmonary artery is of normal size. Venous: The inferior vena cava is normal in size with less than 50% respiratory variability, suggesting right atrial pressure of 8 mmHg. IAS/Shunts: The interatrial septum was not well visualized. Additional Comments: 3D was performed not requiring image post processing on an independent workstation and was normal.  LEFT VENTRICLE PLAX 2D LVIDd:         5.30 cm         Diastology LVIDs:         3.50 cm         LV e' medial:    10.40 cm/s LV PW:         0.80 cm         LV E/e' medial:  13.7 LV IVS:        1.00 cm         LV e' lateral:   13.90 cm/s LVOT diam:     2.00 cm         LV E/e' lateral: 10.2 LV SV:         73 LV SV Index:   43              2D Longitudinal LVOT  Area:     3.14 cm        Strain LV IVRT:       82 msec         2D Strain GLS   -10.9 %                                Avg:                                 3D Volume EF                                LV 3D EF:    Left                                             ventricul                                             ar                                             ejection                                             fraction                                              by 3D                                             volume is                                             52 %.                                 3D Volume EF:                                3D EF:        52 % RIGHT VENTRICLE RV Basal diam:  4.55 cm     PULMONARY VEINS RV S prime:     11.30 cm/s  Diastolic Velocity: 34.90 cm/s TAPSE (M-mode): 2.1 cm      S/D Velocity:       1.90  Systolic Velocity:  66.20 cm/s LEFT ATRIUM           Index        RIGHT ATRIUM           Index LA diam:      4.70 cm 2.74 cm/m   RA Area:     11.30 cm LA Vol (A2C): 24.7 ml 14.41 ml/m  RA Volume:   23.40 ml  13.65 ml/m LA Vol (A4C): 54.1 ml 31.56 ml/m  AORTIC VALVE                     PULMONIC VALVE AV Area (Vmax):    1.47 cm      PV Vmax:       2.23 m/s AV Area (Vmean):   1.42 cm      PV Vmean:      152.726 cm/s AV Area (VTI):     1.87 cm      PV VTI:        0.411 m AV Vmax:           212.00 cm/s   PV Peak grad:  19.9 mmHg AV Vmean:          150.667 cm/s  PV Mean grad:  10.5 mmHg AV VTI:            0.392 m AV Peak Grad:      18.0 mmHg AV Mean Grad:      11.0 mmHg LVOT Vmax:         99.00 cm/s LVOT Vmean:        68.100 cm/s LVOT VTI:          0.233 m LVOT/AV VTI ratio: 0.59  AORTA Ao Root diam: 2.50 cm MITRAL VALVE                  TRICUSPID VALVE MV Area (PHT): 4.49 cm       TR Peak grad:   57.8 mmHg MV Area VTI:   2.11 cm       TR Vmax:        380.00 cm/s MV Peak grad:  11.3 mmHg MV Mean grad:  6.0 mmHg       SHUNTS MV Vmax:       1.68 m/s       Systemic VTI:  0.23 m MV Vmean:      119.0 cm/s     Systemic Diam: 2.00 cm MV Decel Time: 169 msec MR Peak grad:    115.3 mmHg MR Mean grad:    80.0 mmHg MR Vmax:         537.00 cm/s MR Vmean:        423.0 cm/s MR PISA:         1.57 cm MR PISA Eff ROA: 9 mm MR PISA Radius:  0.50 cm MV E velocity: 142.00 cm/s MV A velocity: 141.00 cm/s MV E/A ratio:  1.01 Lonni End MD Electronically signed by Lonni Hanson MD  Signature Date/Time: 01/21/2024/2:10:34 PM    Final    DG Tibia/Fibula Right Result Date: 01/20/2024 EXAM: _VIEWS_ VIEW(S) XRAY OF THE RIGHT TIBIA AND FIBULA 01/20/2024 08:14:00 PM COMPARISON: None available. CLINICAL HISTORY: [id number] Cellulitis [id number] FINDINGS: BONES AND JOINTS: No acute fracture. No focal osseous lesion. No joint dislocation. SOFT TISSUES: Diffuse soft tissue edema. IMPRESSION: 1. Diffuse soft tissue edema, compatible with cellulitis in the appropriate context. Electronically signed by: Franky Stanford MD 01/20/2024 08:56 PM EST RP Workstation: [id number]  CT Head Wo Contrast Result Date: 01/20/2024 EXAM: CT HEAD WITHOUT 01/20/2024 06:41:45 PM TECHNIQUE: CT of the head was performed without the administration of intravenous contrast. Automated exposure control, iterative reconstruction, and/or weight based adjustment of the mA/kV was utilized to reduce the radiation dose to as low as reasonably achievable. COMPARISON: 08/28/2023 CLINICAL HISTORY: Mental status change, unknown cause. FINDINGS: BRAIN AND VENTRICLES: No acute intracranial hemorrhage. No mass effect or midline shift. No extra-axial fluid collection. No evidence of acute infarct. No hydrocephalus. ORBITS: No acute abnormality. SINUSES AND MASTOIDS: Left mastoidectomy. Trace fluid in right mastoid air cells. SOFT TISSUES AND SKULL: No acute skull fracture. No acute soft tissue abnormality. IMPRESSION: 1. No acute intracranial abnormality. 2. Left mastoidectomy and trace fluid in right mastoid air cells. Electronically signed by: Franky Stanford MD 01/20/2024 07:32 PM EST RP Workstation: [id number]   DG Chest Portable 1 View Result Date: 01/20/2024 CLINICAL DATA:  COPD exacerbation. EXAM: PORTABLE CHEST 1 VIEW COMPARISON:  Radiographs 11/13/2023 and 11/12/2023.  CTA 11/10/2023. FINDINGS: 1413 hours. Two views are submitted. The heart size and mediastinal contours are stable. Previously demonstrated small right pleural effusion has  resolved. There is improved aeration of both lung bases with mild residual atelectasis or scarring on the right. No confluent airspace disease or pneumothorax. No acute osseous findings are evident. There are mild degenerative changes in the spine. Telemetry leads overlie the chest. IMPRESSION: Interval resolution of right pleural effusion with improved aeration of both lung bases. No acute cardiopulmonary process. Electronically Signed   By: Elsie Perone M.D.   On: 01/20/2024 15:09   Disposition Plan & Communication  Patient status: Inpatient  Admitted From: Home Planned disposition location: Skilled nursing facility Anticipated discharge date: 12/4 pending AKI resolution   Family Communication: none at bedside    Author: Marien LITTIE Piety, DO [facility] 01/22/2024, 8:00 AM   Available by Epic secure chat 7AM-7PM. If 7PM-7AM, please contact night-coverage.  [facility] contact information found on [email or web address].

## 2024-01-22 NOTE — Progress Notes (Signed)
 Initial Nutrition Assessment  DOCUMENTATION CODES:   Non-severe (moderate) malnutrition in context of social or environmental circumstances  INTERVENTION:   -Liberalize diet to regular for widest variety of meal selections -MVI with minerals daily -500 mg vitamin C  BID -220 mg zinc  sulfate daily x 14 days -RD will draw labs to evaluate for potential micronutrient deficiencies which may impede wound healing: vitamin A and CRP (to better interpret lab values) -Transitions of care consult for discharge disposition -Spiritual care consult -Feeding assistance with meals  NUTRITION DIAGNOSIS:   Moderate Malnutrition related to social / environmental circumstances as evidenced by edema, mild muscle depletion, moderate muscle depletion, mild fat depletion, moderate fat depletion.  GOAL:   Patient will meet greater than or equal to 90% of their needs  MONITOR:   PO intake, Supplement acceptance  REASON FOR ASSESSMENT:   Consult Assessment of nutrition requirement/status  ASSESSMENT:   63 y.o. female with medical history significant of anxiety, osteoarthritis, asthma, COPD,  GERD, dyslipidemia, and HTN presentswith sepsis and wound infection ro right anterior skin. ED nurses did report that earlier she said she is not being cared for in the home and she does not want to return.  Patient admitted with sepsis without shcok and wound infection to right anterior shin.   12/2- s/p BSE- regular diet  Reviewed I/O's: -1.2 L x 24 hours and -820 ml x 24 hours  Per CWOCN notes, patient with full thickness wounds: right lower leg likely related to trauma (dark hemorrhagic and necrotic tissues); left lower leg likely related to trauma (hemorrhagic and necrotic tissue); and bilateral knees related to trauma (dry scabbing).   Case discussed with SLP, who reports concern about patient's nutritional status; she is concerned about patient's home situation and malnutrition. Patient does not have  teeth secondary to prior tooth extractions, however, is able to consume a regular diet without difficulty. SLP asking RD to evaluate in the setting of wounds and likely malnutrition.   Patient is familiar to this RD due to multiple prior admissions. Patient recognized RD when entered room and eager to engage in conversation. Patient states that she is trying to stay out of the hospital. Patient states that she was last admitted in 10/2023, which RD confirmed is the last ime she was seen by RD team. Patient reports after previous discharge she was discharged to SNF for approximately 2 weeks and was discharged back home. Patient reports that she has multiple concerns, as it is becoming more difficult to live alone with minimal support. She lives with her son-in-law, who is away most of the day at work and has a granddaughter who checks on her occasionally. Patient reports she is mainly wheelchair bound, as she does not feel safe using a walker due to high fall risk.  Noted patient with multiple wounds, scabs, and generalized ecchymosis. Patient states she last fell about a week ago, but often develops scabs of bruises from her wheelchair and bumping into things, such as doorframes. She expresses concern about returning home due to limited support and states I think my son-in-law wants me to move out. She also reports concern about who will care for her dogs if she is unable to return home. Emotional support provided.    Patient reports fair appetite. She generally consumes 2 meals per day. Per patient, she often prepares simple things such as sandwiches and TV dinners as she is alone and they are easy to prepare. Noted she consumed 100% of her breakfast. Patient reports  her right hand is weak (her primary hand); she has mentioned this to her providers and reports it is difficult to feed herself and open packages due to this.   Patient being prescribed creon  TID. Patient reports she also takes this at  home, but usually takes once a day. RD discussed what creon  is and importance of taking with meals to improve absorption and digestion to help improve nutritional status. She denies any loose stools or constipation.   Patient denies any weight loss. She acknowledges weight loss in the past, however, thinks she has gained weight- 120-145# since September 2025. Reviewed weight history; patient has experienced a 16.3% weight loss over the past 9 months, which is significant for time frame. Patient also with history of CHF and has edema; weight trends difficult to interpret due to this. Of note, patient's exam improved since last admission, however, edema likely masking further weight loss as well as fat and muscle depletions.   Discussed importance of good meal and supplement intake to promote healing. Patient amenable to [employer], remembers consuming them last time and tolerating well (will also come on meal trays to help with creon  administration). Patient reports that she would eatin better if she could have sugar (Splenda doesn't sweeten things as well as sugar). Given patient is on no DM medications at home, is not on DM medications here, and CBGS are being checked, RD will liberalize diet for widest variety of meal selections. Patient reports she checks her CBGS osccasionally at home (when I remember) and readings are usually between 120-150.   Medications reviewed and include sinemet , neurontin , creon , protonix , and lactated ringers  infusion @ 100 ml/hr.   Labs reviewed.    NUTRITION - FOCUSED PHYSICAL EXAM:  Flowsheet Row Most Recent Value  Orbital Region Mild depletion  Upper Arm Region Moderate depletion  Thoracic and Lumbar Region Mild depletion  Buccal Region Mild depletion  Temple Region Moderate depletion  Clavicle Bone Region Mild depletion  Clavicle and Acromion Bone Region Mild depletion  Scapular Bone Region Mild depletion  Dorsal Hand Moderate depletion  Patellar Region  Moderate depletion  Anterior Thigh Region Moderate depletion  Posterior Calf Region Moderate depletion  Edema (RD Assessment) Mild  Hair Reviewed  Eyes Reviewed  Mouth Reviewed  Skin Reviewed  Nails Reviewed    Diet Order:   Diet Order             Diet regular Fluid consistency: Thin  Diet effective now                   EDUCATION NEEDS:   Education needs have been addressed  Skin:  Skin Assessment: Skin Integrity Issues: Skin Integrity Issues:: Other (Comment) Other: full thickness wound to right lower leg related to trauma infectious at this time dark hemorrhagic and necrotic tissue; full thickness wounf to left lower leg likely related to trauma (dry brown tissue); full thickness wound to bilateral knees likely related to trauma with dry scabbing  Last BM:  01/21/24 (type 7)  Height:   Ht Readings from Last 1 Encounters:  01/21/24 5' 8 (1.727 m)    Weight:   Wt Readings from Last 1 Encounters:  01/21/24 60 kg    Ideal Body Weight:  63.6 kg  BMI:  Body mass index is 20.11 kg/m.  Estimated Nutritional Needs:   Kcal:  1800-2000  Protein:  90-105 grams  Fluid:  1.8-2.0 L    Margery ORN, RD, LDN, CDCES Registered Dietitian III Certified Diabetes  Care and Education Specialist If unable to reach this RD, please use RD Inpatient group chat on secure chat between hours of 8am-4 pm daily

## 2024-01-22 NOTE — TOC Initial Note (Addendum)
 Transition of Care [facility]) - Initial/Assessment Note    Patient Details  Name: Tricia [name] MRN: [id number] Date of Birth: 10-17-1960  Transition of Care [facility]) CM/SW Contact:    Dalia GORMAN Fuse, RN Phone Number: 01/22/2024, 9:27 AM  Clinical Narrative:                 Patient is from home with her daughter and son-law. The patient is having difficulty completing everyday task and requires assistance. The patient uses [facility] and has a PCP. She has DME: electrical wheel chair, oxygen  machine at night, and walker in the home. Therapy recs are pending at this time. Patient with a recent admission to [facility] on 9/28.  Expected Discharge Plan: Skilled Nursing Facility Barriers to Discharge: Continued Medical Work up   Patient Goals and CMS Choice            Expected Discharge Plan and Services   Discharge Planning Services: CM Consult   Living arrangements for the past 2 months: Single Family Home                                      Prior Living Arrangements/Services Living arrangements for the past 2 months: Single Family Home Lives with:: Adult Children              Current home services: DME    Activities of Daily Living   ADL Screening (condition at time of admission) Independently performs ADLs?: No Does the patient have a NEW difficulty with bathing/dressing/toileting/self-feeding that is expected to last >3 days?: No Does the patient have a NEW difficulty with getting in/out of bed, walking, or climbing stairs that is expected to last >3 days?: No Does the patient have a NEW difficulty with communication that is expected to last >3 days?: No Is the patient deaf or have difficulty hearing?: No Does the patient have difficulty seeing, even when wearing glasses/contacts?: No Does the patient have difficulty concentrating, remembering, or making decisions?: No  Permission Sought/Granted                  Emotional Assessment               Admission diagnosis:  AMS (altered mental status) [R41.82] Sepsis, due to unspecified organism, unspecified whether acute organ dysfunction present [facility] [A41.9] 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) 01/21/2024   Elevated troponin 01/21/2024   Urinary retention 01/21/2024   Diastolic dysfunction    AMS (altered mental status) 01/20/2024   Serratia marcescens infection 11/16/2023   Gram-negative bacteremia 11/16/2023   Foot abscess, right 11/14/2023   Hypomagnesemia 11/14/2023   Toe ulcer (HCC) 11/13/2023   Drop in hemoglobin 11/13/2023   Pleural effusion on right 11/13/2023   Leg swelling 11/01/2023   Sepsis due to cellulitis (HCC) 10/30/2023   Asthma, chronic 10/30/2023   AKI (acute kidney injury) 10/30/2023   Hyperkalemia 10/30/2023   Hyponatremia 10/30/2023   Physical deconditioning 09/23/2023   Acute metabolic encephalopathy 09/23/2023   Pressure injury 09/23/2023   Chronic respiratory failure with hypoxia (HCC) 09/22/2023   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/22/2023   SIRS (systemic inflammatory response syndrome) (HCC) 09/22/2023   Chronic prescription opiate use 09/22/2023   Frequent falls 09/22/2023   Protein-calorie malnutrition, severe 09/17/2023  Malnutrition of moderate degree 09/02/2023   Syncope, vasovagal 08/29/2023   Parkinson disease (HCC) 08/29/2023   Anxiety and depression 08/29/2023   Chronic obstructive pulmonary disease (COPD) (HCC) 08/29/2023   GERD without esophagitis 08/29/2023   Elevated CK 08/29/2023   Diabetes mellitus without complication (HCC) 08/29/2023   Acute kidney injury superimposed on chronic kidney disease 08/28/2023   Sacral wound 02/21/2022   Pressure injury of sacral region, stage 1 02/21/2022   CAP (community acquired pneumonia) 09/08/2021   Nausea 09/08/2021    Hypoalbuminemia due to protein-calorie malnutrition 09/08/2021   Lumbar herniated disc 07/11/2021   Calculus of gallbladder without cholecystitis without obstruction 07/11/2021   Senile purpura 06/05/2021   Atherosclerosis of aorta 06/05/2021   Parkinson's disease (HCC) 06/05/2021   Chronic kidney disease (CKD) stage G3a/A2, moderately decreased glomerular filtration rate (GFR) between 45-59 mL/min/1.73 square meter and albuminuria creatinine ratio between 30-299 mg/g (HCC) 06/05/2021   Esophageal dysphagia    Gastric erythema    Columnar-lined esophagus    Moderate malnutrition 09/26/2020   Centrilobular emphysema (HCC) 03/30/2020   History of urinary retention 11/18/2019   GAD (generalized anxiety disorder) 10/13/2019   MDD (major depressive disorder), recurrent episode, moderate (HCC) 10/13/2019   At risk for long QT syndrome 10/13/2019   Nonrheumatic mitral valve regurgitation 05/04/2019   Benign neoplasm of descending colon    Polyp of sigmoid colon    History of Klebsiella UTI 09/11/2023 01/24/2015   Polyneuropathy 10/21/2014   Chronic venous insufficiency 10/05/2014   Bilateral leg edema 08/16/2014   Major depression in partial remission 08/16/2014   GERD (gastroesophageal reflux disease) 08/16/2014   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   Asthma, moderate persistent 08/16/2014   Carpal tunnel syndrome 08/16/2014   Cervical pain 08/16/2014   CAFL (chronic airflow limitation) (HCC) 08/16/2014   Type 2 diabetes mellitus with peripheral neuropathy (HCC) 08/16/2014   Diabetes mellitus type 2, insulin  dependent (HCC) 08/16/2014   Dyslipidemia 08/16/2014   Tobacco use disorder 08/16/2014   Benign neoplasm of stomach 08/16/2014   Gout 08/16/2014   Mixed hyperlipidemia 08/16/2014   Low back pain 08/16/2014   Lumbar radiculopathy 08/16/2014   Headache, migraine 08/16/2014   Arthralgia of multiple joints 08/16/2014   Vitamin D  deficiency 08/16/2014   Primary osteoarthritis  of both knees 06/22/2014   Benign essential tremor 10/02/2013   Cervical dystonia 10/02/2013   Chronic diastolic CHF (congestive heart failure) (HCC) 11/16/2012   Chronic pain 11/13/2012   PCP:  Sowles, Krichna, MD Pharmacy:   [facility] [place], [place] - [address] [place] [address] Phone: [phone] Fax: [phone]  [facility]) - [place], [place] - [address] [facility] [address] [place] [address] Phone: [phone] Fax: [phone]  [facility] - [place], [place] - [address] [place] [address] Phone: [phone] Fax: [phone]     Social Drivers of Health (SDOH) Social History: SDOH Screenings   Food Insecurity: Food Insecurity Present (01/21/2024)  Housing: High Risk (01/21/2024)  Transportation Needs: Unmet Transportation Needs (01/21/2024)  Utilities: At Risk (01/21/2024)  Alcohol Screen: Low Risk  (03/07/2023)  Depression (PHQ2-9): High Risk (05/01/2023)  Financial Resource Strain: Patient Declined (06/18/2023)   Received from [facility]  Physical Activity: Sufficiently Active (03/07/2023)  Social Connections: Socially Isolated (08/29/2023)  Stress: No Stress Concern Present (03/07/2023)  Tobacco Use: High Risk (01/20/2024)  Health  Literacy: Adequate Health Literacy (03/07/2023)   SDOH Interventions:     Readmission Risk Interventions    11/02/2023    1:21 PM  Readmission Risk Prevention Plan  Transportation Screening Complete  Medication Review (RN Care Manager) Complete  PCP or Specialist appointment within 3-5 days of discharge Complete  SW Recovery Care/Counseling Consult Complete  Palliative Care Screening Not Applicable  Skilled Nursing Facility Not Applicable

## 2024-01-22 NOTE — Consult Note (Addendum)
 Pharmacy Antibiotic Note  Tricia [name] is a 63 y.o. female admitted on 01/20/2024 with sepsis.  Prior wound cultures with Serratia and methicillin-resistant Staphylococcus cohnii. Pharmacy has been consulted for vancomycin  and cefepime  dosing. Scr is still slightly elevated from baseline but is improving. Vancomycin  random level this morning 13 mcg/ml  Plan: Given Scr is down-trending and close to baseline, will go ahead and schedule maintenance dose  Start vancomycin  750 mg IV Q24H. Goal AUC 400-550. Expected AUC: 516.6 Expected Css min: 14.5 SCr used: 1.55  Weight used: TBW<IBW, Vd used: 0.72 (BMI 20.11) Patient is also on cefepime  2 g IV Q12H Continue to monitor renal function and follow culture results    Height: 5' 8 (172.7 cm) Weight: 60 kg (132 lb 4.4 oz) IBW/kg (Calculated) : 63.9  Temp (24hrs), Avg:98.4 F (36.9 C), Min:97.4 F (36.3 C), Max:98.8 F (37.1 C)  Recent Labs  Lab 01/20/24 1356 01/20/24 1430 01/20/24 2059 01/21/24 0425 01/21/24 0919 01/22/24 0915  WBC 5.8  --  3.8* 4.3  --   --   CREATININE 2.41*  --  2.30* 2.16*  --  1.55*  LATICACIDVEN  --  1.6  --   --   --   --   VANCORANDOM  --   --  17  --  12  --     Estimated Creatinine Clearance: 35.2 mL/min (A) (by C-G formula based on SCr of 1.55 mg/dL (H)).    Allergies  Allergen Reactions   Augmentin  [Amoxicillin -Pot Clavulanate] Diarrhea   Penicillins Itching    Antimicrobials this admission: 12/1 vancomycin  >>  12/1 cefepime  >>   Dose adjustments this admission: None  Microbiology results: 12/1 BCx: NG x 2 days 12/2 Urine cx: IP   Thank you for allowing pharmacy to be a part of this patient's care.  Lum VEAR Mania, PharmD, BCPS 01/22/2024 10:03 AM

## 2024-01-22 NOTE — [place] FL2 (Signed)
 [place]  MEDICAID FL2 LEVEL OF CARE FORM     IDENTIFICATION  Patient Name: TAYTUM SCHECK Birthdate: 09-10-1960 Sex: female Admission Date (Current Location): 01/20/2024  [place] and [place] Number:  Chiropodist and Address:  [facility], [address] [place], [place] [address]      Provider Number: [id number]  Attending Physician Name and Address:  Lenon Marien LITTIE, MD  Relative Name and Phone Number:  Elden Sharper (Other)  [phone]    Current Level of Care: SNF Recommended Level of Care: Skilled Nursing Facility Prior Approval Number:    Date Approved/Denied:   PASRR Number:    Discharge Plan:      Current Diagnoses: 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) 01/21/2024   Elevated troponin 01/21/2024   Urinary retention 01/21/2024   Diastolic dysfunction    AMS (altered mental status) 01/20/2024   Serratia marcescens infection 11/16/2023   Gram-negative bacteremia 11/16/2023   Foot abscess, right 11/14/2023   Hypomagnesemia 11/14/2023   Toe ulcer (HCC) 11/13/2023   Drop in hemoglobin 11/13/2023   Pleural effusion on right 11/13/2023   Leg swelling 11/01/2023   Sepsis due to cellulitis (HCC) 10/30/2023   Asthma, chronic 10/30/2023   AKI (acute kidney injury) 10/30/2023   Hyperkalemia 10/30/2023   Hyponatremia 10/30/2023   Physical deconditioning 09/23/2023   Acute metabolic encephalopathy 09/23/2023   Pressure injury 09/23/2023   Chronic respiratory failure with hypoxia (HCC) 09/22/2023   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/22/2023   SIRS (systemic inflammatory response syndrome) (HCC) 09/22/2023   Chronic prescription opiate use 09/22/2023   Frequent falls 09/22/2023   Protein-calorie malnutrition, severe 09/17/2023   Malnutrition of moderate degree 09/02/2023    Syncope, vasovagal 08/29/2023   Parkinson disease (HCC) 08/29/2023   Anxiety and depression 08/29/2023   Chronic obstructive pulmonary disease (COPD) (HCC) 08/29/2023   GERD without esophagitis 08/29/2023   Elevated CK 08/29/2023   Diabetes mellitus without complication (HCC) 08/29/2023   Acute kidney injury superimposed on chronic kidney disease 08/28/2023   Sacral wound 02/21/2022   Pressure injury of sacral region, stage 1 02/21/2022   CAP (community acquired pneumonia) 09/08/2021   Nausea 09/08/2021   Hypoalbuminemia due to protein-calorie malnutrition 09/08/2021   Lumbar herniated disc 07/11/2021   Calculus of gallbladder without cholecystitis without obstruction 07/11/2021   Senile purpura 06/05/2021   Atherosclerosis of aorta 06/05/2021   Parkinson's disease (HCC) 06/05/2021   Chronic kidney disease (CKD) stage G3a/A2, moderately decreased glomerular filtration rate (GFR) between 45-59 mL/min/1.73 square meter and albuminuria creatinine ratio between 30-299 mg/g (HCC) 06/05/2021   Esophageal dysphagia    Gastric erythema    Columnar-lined esophagus    Moderate malnutrition 09/26/2020   Centrilobular emphysema (HCC) 03/30/2020   History of urinary retention 11/18/2019   GAD (generalized anxiety disorder) 10/13/2019   MDD (major depressive disorder), recurrent episode, moderate (HCC) 10/13/2019   At risk for long QT syndrome 10/13/2019   Nonrheumatic mitral valve regurgitation 05/04/2019   Benign neoplasm of descending colon    Polyp of sigmoid colon    History of Klebsiella UTI 09/11/2023 01/24/2015   Polyneuropathy 10/21/2014   Chronic venous insufficiency 10/05/2014   Bilateral leg edema 08/16/2014   Major depression in partial remission 08/16/2014   GERD (gastroesophageal reflux disease) 08/16/2014   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   Asthma, moderate persistent 08/16/2014  Carpal tunnel syndrome 08/16/2014   Cervical pain 08/16/2014   CAFL (chronic airflow  limitation) (HCC) 08/16/2014   Type 2 diabetes mellitus with peripheral neuropathy (HCC) 08/16/2014   Diabetes mellitus type 2, insulin  dependent (HCC) 08/16/2014   Dyslipidemia 08/16/2014   Tobacco use disorder 08/16/2014   Benign neoplasm of stomach 08/16/2014   Gout 08/16/2014   Mixed hyperlipidemia 08/16/2014   Low back pain 08/16/2014   Lumbar radiculopathy 08/16/2014   Headache, migraine 08/16/2014   Arthralgia of multiple joints 08/16/2014   Vitamin D  deficiency 08/16/2014   Primary osteoarthritis of both knees 06/22/2014   Benign essential tremor 10/02/2013   Cervical dystonia 10/02/2013   Chronic diastolic CHF (congestive heart failure) (HCC) 11/16/2012   Chronic pain 11/13/2012    Orientation RESPIRATION BLADDER Height & Weight     Self, Time    Continent Weight: 60 kg Height:  5' 8 (172.7 cm)  BEHAVIORAL SYMPTOMS/MOOD NEUROLOGICAL BOWEL NUTRITION STATUS      Continent Diet (modified carb)  AMBULATORY STATUS COMMUNICATION OF NEEDS Skin   Extensive Assist Verbally                         Personal Care Assistance Level of Assistance  Bathing, Feeding, Dressing Bathing Assistance: Maximum assistance Feeding assistance: Limited assistance Dressing Assistance: Maximum assistance     Functional Limitations Info             SPECIAL CARE FACTORS FREQUENCY  PT (By licensed PT), OT (By licensed OT)     PT Frequency: 5 x week OT Frequency: 5 x week            Contractures      Additional Factors Info  Code Status, Allergies Code Status Info: FULL Allergies Info: Augmentin  & Penicillins           Current Medications (01/22/2024):  This is the current hospital active medication list Current Facility-Administered Medications  Medication Dose Route Frequency Provider Last Rate Last Admin   acetaminophen  (TYLENOL ) tablet 650 mg  650 mg Oral Q6H PRN Krugh, Marissa C, DO   650 mg at 01/21/24 2103   Or   acetaminophen  (TYLENOL ) suppository 650 mg   650 mg Rectal Q6H PRN Krugh, Marissa C, DO       calcium  carbonate (TUMS - dosed in mg elemental calcium ) chewable tablet 400 mg of elemental calcium   400 mg of elemental calcium  Oral TID PRN Amin, Sumayya, MD       carbidopa -levodopa  (SINEMET  CR) 50-200 MG per tablet controlled release 1 tablet  1 tablet Oral QHS Amin, Sumayya, MD   1 tablet at 01/21/24 2253   carbidopa -levodopa  (SINEMET  IR) 10-100 MG per tablet immediate release 1 tablet  1 tablet Oral TID WC Amin, Sumayya, MD   1 tablet at 01/22/24 0840   ceFEPIme  (MAXIPIME ) 2 g in sodium chloride  0.9 % 100 mL IVPB  2 g Intravenous Q24H Patel, Kishan S, RPH   Stopped at 01/21/24 1735   Chlorhexidine  Gluconate Cloth 2 % PADS 6 each  6 each Topical Daily Amin, Sumayya, MD   6 each at 01/21/24 2250   collagenase  (SANTYL ) ointment   Topical Daily Krugh, Marissa C, DO   Given at 01/21/24 2253   cyclobenzaprine  (FLEXERIL ) tablet 10 mg  10 mg Oral TID Amin, Sumayya, MD   10 mg at 01/22/24 0839   DULoxetine  (CYMBALTA ) DR capsule 60 mg  60 mg Oral Daily Amin, Sumayya, MD   60 mg at  01/22/24 0839   enoxaparin  (LOVENOX ) injection 30 mg  30 mg Subcutaneous Q24H Krugh, Marissa C, DO   30 mg at 01/21/24 2252   fluticasone  furoate-vilanterol (BREO ELLIPTA ) 200-25 MCG/ACT 1 puff  1 puff Inhalation Daily Amin, Sumayya, MD   1 puff at 01/22/24 0840   gabapentin  (NEURONTIN ) capsule 600 mg  600 mg Oral BID Amin, Sumayya, MD   600 mg at 01/22/24 0840   hydrOXYzine  (ATARAX ) tablet 25 mg  25 mg Oral BID Amin, Sumayya, MD   25 mg at 01/21/24 2252   ipratropium-albuterol  (DUONEB) 0.5-2.5 (3) MG/3ML nebulizer solution 3 mL  3 mL Nebulization Q4H PRN Krugh, Marissa C, DO   3 mL at 01/21/24 1725   lactated ringers  infusion   Intravenous Continuous Amin, Sumayya, MD 100 mL/hr at 01/22/24 0041 New Bag at 01/22/24 0041   lipase/protease/amylase (CREON ) capsule 12,000 Units  12,000 Units Oral TID AC Amin, Sumayya, MD   12,000 Units at 01/22/24 0840   montelukast  (SINGULAIR )  tablet 10 mg  10 mg Oral Daily Amin, Sumayya, MD   10 mg at 01/21/24 2251   multivitamin with minerals tablet 1 tablet  1 tablet Oral Daily Amin, Sumayya, MD   1 tablet at 01/22/24 9160   ondansetron  (ZOFRAN ) tablet 4 mg  4 mg Oral Q6H PRN Krugh, Marissa C, DO       Or   ondansetron  (ZOFRAN ) injection 4 mg  4 mg Intravenous Q6H PRN Krugh, Marissa C, DO       oxyCODONE  (Oxy IR/ROXICODONE ) immediate release tablet 5 mg  5 mg Oral Q6H PRN Amin, Sumayya, MD   5 mg at 01/22/24 0839   pantoprazole  (PROTONIX ) EC tablet 40 mg  40 mg Oral Daily Amin, Sumayya, MD   40 mg at 01/22/24 0840   QUEtiapine  (SEROQUEL ) tablet 25 mg  25 mg Oral QHS Amin, Sumayya, MD   25 mg at 01/21/24 2251   rosuvastatin  (CRESTOR ) tablet 5 mg  5 mg Oral QHS Amin, Sumayya, MD   5 mg at 01/21/24 2251   senna-docusate (Senokot-S) tablet 1 tablet  1 tablet Oral QHS PRN Krugh, Marissa C, DO       theophylline  (UNIPHYL) 400 MG 24 hr tablet 400 mg  400 mg Oral Daily Amin, Sumayya, MD   400 mg at 01/22/24 0840   umeclidinium bromide  (INCRUSE ELLIPTA ) 62.5 MCG/ACT 1 puff  1 puff Inhalation Daily Tobie Cathaleen RAMAN, RPH   1 puff at 01/22/24 0840   vancomycin  variable dose per unstable renal function (pharmacist dosing)   Does not apply See admin instructions Patel, Kishan S, RPH         Discharge Medications: Please see discharge summary for a list of discharge medications.  Relevant Imaging Results:  Relevant Lab Results:   Additional Information SSN: [id number]  Dalia RAMAN Fuse, RN

## 2024-01-22 NOTE — Plan of Care (Signed)

## 2024-01-22 NOTE — Progress Notes (Signed)
 Occupational Therapy Treatment Patient Details Name: Tricia [name] MRN: [id number] DOB: March 26, 1960 Today's Date: 01/22/2024   History of present illness Pt is a 63 y.o. female admitted with AMS, sepsis, anterior right shin wound infection, acute hypoxic respiratory failure, AKI. 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   OT comments  Pt seen for OT treatment this date. Pt continues to present with R wrist drop and inability to actively extend R wrist. Secure chat to MD regarding concerns as MRI / CT head neg and pt reports onset approx ~1 week ago. Will follow and check for additional imaging of C-Spine if recommended by physician. Pt requires CGA for bed mobility, performs STS with +1 HHA, and is able to take a few steps before fatiguing. Pt returned to bed, and repositioned in sidelying for pressure relief. Discharge recommendation appropriate, OT will continue to follow.       If plan is discharge home, recommend the following:  A lot of help with bathing/dressing/bathroom;A little help with walking and/or transfers;Supervision due to cognitive status;Help with stairs or ramp for entrance;Direct supervision/assist for financial management;Direct supervision/assist for medications management;Assist for transportation   Equipment Recommendations  None recommended by OT       Precautions / Restrictions Precautions Precautions: Fall Recall of Precautions/Restrictions: Impaired Precaution/Restrictions Comments: R wrist drop Restrictions Weight Bearing Restrictions Per Provider Order: No       Mobility Bed Mobility Overal bed mobility: Needs Assistance Bed Mobility: Sit to Supine, Supine to Sit     Supine to sit: Contact guard Sit to supine: Contact guard assist         Transfers Overall transfer level: Needs assistance Equipment used: None Transfers: Sit to/from Stand, Bed to chair/wheelchair/BSC Sit to Stand: Contact guard assist           General transfer comment: Pt performs STS with CGA, reports feeling weak and faigues quickly     Balance Overall balance assessment: Needs assistance Sitting-balance support: No upper extremity supported, Feet supported Sitting balance-Leahy Scale: Good     Standing balance support: Single extremity supported, During functional activity Standing balance-Leahy Scale: Fair Standing balance comment: requires external support                           ADL either performed or assessed with clinical judgement   ADL Overall ADL's : Needs assistance/impaired                                     Functional mobility during ADLs: Minimal assistance      Extremity/Trunk Assessment Upper Extremity Assessment RUE Deficits / Details: Pt with significant R wrist drop and inability to extend R wrist. R-sided weakness compared to L. Pt states tingling in all digits, decreased FMC / GMC of RUE with impaired digit opposition. RUE strength 3-/5, poor grip strength.                     Communication Communication Communication: No apparent difficulties Factors Affecting Communication: Difficulty expressing self   Cognition Arousal: Alert Behavior During Therapy: WFL for tasks assessed/performed Cognition: Cognition impaired     Awareness: Intellectual awareness impaired     Executive functioning impairment (select all impairments):  Problem solving, Reasoning                   Following commands: Impaired Following commands impaired: Follows one step commands with increased time      Cueing   Cueing Techniques: Verbal cues, Gestural cues        General Comments VSS on RA. Secure chat to RN re: Foley noted to have blood with clots in line. NT updated as well as Foley  needs to be emptied.    Pertinent Vitals/ Pain       Pain Assessment Pain Assessment: No/denies pain         Frequency  Min 2X/week        Progress Toward Goals  OT Goals(current goals can now be found in the care plan section)  Progress towards OT goals: Progressing toward goals  Acute Rehab OT Goals OT Goal Formulation: With patient Time For Goal Achievement: 02/04/24 Potential to Achieve Goals: Good ADL Goals Pt Will Perform Grooming: sitting;with modified independence Pt Will Perform Upper Body Dressing: sitting;with modified independence Pt Will Perform Lower Body Dressing: sitting/lateral leans;sit to/from stand;with modified independence Pt Will Transfer to Toilet: ambulating;regular height toilet;with modified independence Pt Will Perform Toileting - Clothing Manipulation and hygiene: with modified independence;sitting/lateral leans;sit to/from stand Pt/caregiver will Perform Home Exercise Program: Increased ROM;Right Upper extremity;With written HEP provided;With Supervision  Plan         AM-PAC OT 6 Clicks Daily Activity     Outcome Measure   Help from another person eating meals?: None Help from another person taking care of personal grooming?: None Help from another person toileting, which includes using toliet, bedpan, or urinal?: A Lot Help from another person bathing (including washing, rinsing, drying)?: A Lot Help from another person to put on and taking off regular upper body clothing?: A Little Help from another person to put on and taking off regular lower body clothing?: A Lot 6 Click Score: 17    End of Session    OT Visit Diagnosis: Muscle weakness (generalized) (M62.81);Repeated falls (R29.6);History of falling (Z91.81);Unsteadiness on feet (R26.81)   Activity Tolerance Patient tolerated treatment well   Patient Left in bed;with call bell/phone within reach;with bed alarm set   Nurse Communication Mobility status (blood clots in Foley  line)        Time: 8793-8779 OT Time Calculation (min): 14 min  Charges: OT General Charges $OT Visit: 1 Visit OT Treatments $Therapeutic Activity: 8-22 mins  Rondrick Barreira L. Florencio Hollibaugh, OTR/L  01/22/24, 4:14 PM

## 2024-01-22 NOTE — Progress Notes (Signed)

## 2024-01-23 DIAGNOSIS — L0390 Cellulitis, unspecified: Secondary | ICD-10-CM | POA: Diagnosis not present

## 2024-01-23 DIAGNOSIS — A419 Sepsis, unspecified organism: Secondary | ICD-10-CM | POA: Diagnosis not present

## 2024-01-23 NOTE — Progress Notes (Signed)
 Orthopedic Tech Progress Note Patient Details:  Tricia [name] [birth year] [id number] Made [facility] aware of routine order for resting Who Patient ID: Tricia [name], female   DOB: Jun 28, 1960, 63 y.o.   MRN: [id number]  Tricia [name] 01/23/2024, 3:05 PM

## 2024-01-23 NOTE — Care Management Important Message (Signed)
 Important Message  Patient Details  Name: Tricia [name] MRN: [id number] Date of Birth: 1960-05-22   Important Message Given:  Yes - Medicare IM     Alyse Kathan W, CMA 01/23/2024, 11:00 AM

## 2024-01-23 NOTE — Progress Notes (Signed)
 PROGRESS NOTE Tricia [name]    DOB: 20-Oct-1960, 63 y.o.  FMW:[id number]    Code Status: Full Code   DOA: 01/20/2024   LOS: 3  Brief hospital course  Tricia [name] is a 64 y.o. female with a PMH significant for anxiety, osteoarthritis, asthma, COPD, GERD, dyslipidemia, and HTN.  Presents with SOB and wheezing. Also with AMS, thought to be secondary to opioids.  Recent hospitalization in September 2025 secondary to bacteremia with a chronic wound on anterior right shin.  There was a dirty dressing on it on arrival.  On presentation febrile at 100.9, sinus tachycardia, saturating appropriately on 4 L of oxygen .  Labs with no leukocytosis, BUN of 28, creatinine 2.41 with baseline around 1.  Troponin peaked at 164, BNP greater than 16,000.  She has a fairly unremarkable echo in July 2025. Chest x-ray of was negative for any acute abnormality.  COVID/flu/RSV negative.  TB and fibula imaging with soft tissue edema, no osseous lesion.   Patient was started on cefepime  and vancomycin  for sepsis.   01/23/24 -patient is doing well and can be discharged to SNF when available.  Has a DSS worker involved who discussed SNF with patient and she is now agreeable to going.   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 Continues on Abx.  Chronic leg wound. Blood Cx NGTD. imaging concerning for cellulitis, no osseous abnormality. Prior wound cultures with  Serratia and methicillin-resistant Staphylococcus cohnii - Continue with cefepime , follow cultures - discontinue vancomycin  as no evidence of purulent collection. She is a MRSA colonizer with positive nare swab - PT/OT - wound care   AMS- resolved back to baseline. Brain MRI negative acute. Likely secondary to opioid use with constricted pupils and positive UDS on admission and resolved with holding of the meds.  - cautiously continue pain medications and other sedating medications   Acute kidney injury superimposed on chronic kidney disease Acute on chronic Urinary retention.  History of underlying CKD stage III A. Bilateral hydronephrosis seen on renal US . Scant blood in urine collection bag. - continue foley catheter. Consider voiding trial prior to dc vs at outpatient follow up with urology - Cr improving but not to baseline yet. Cr 2.16>1.55 - Avoid nephrotoxins - BMP am   Elevated troponin No chest pain, likely secondary to demand ischemia.  Echo done in July 2025 was fairly unremarkable. - Repeat echo done- unremarkable    Chronic obstructive pulmonary disease (COPD) (HCC) Patient was initially placed on 4 L of oxygen  in ED with no documented hypoxia. She states that she intermittently uses O2 PRN 2-3L at baseline due to her COPD. She is currently ORA during our encounter. And later placed on 2L.   Chest x-ray was negative for any acute abnormality. 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 Had 2 remaining sutures. Removed yesterday . Last A1c of 5.1.  Patient was not on any medications at home. -Carb modified diet -Monitor blood glucose   GERD (gastroesophageal reflux disease) - Continue PPI  Body mass index is 20.11 kg/m.  VTE ppx: enoxaparin  (LOVENOX ) injection 40 mg Start:  01/22/24 2200  Diet:     Diet   Diet regular Fluid consistency: Thin   Consultants: None   Subjective 01/23/24    Pt reports feeling well today. Initially declining SNF but is now agreeable     Objective  Blood pressure 128/67, pulse 82, temperature 97.7 F (36.5 C), temperature source Oral, resp. rate 20, height 5' 8 (1.727 m), weight 60 kg, SpO2 100%.  Intake/Output Summary (Last 24 hours) at 01/23/2024 0734 Last data filed at 01/23/2024 0427 Gross per 24 hour  Intake 700 ml  Output 4900 ml  Net -4200 ml   Filed Weights   01/20/24 1350 01/21/24 0731  Weight: 60 kg 60 kg    Physical Exam:  General: awake, alert, NAD HEENT: atraumatic, clear conjunctiva, anicteric sclera, MMM, hearing grossly normal Respiratory: normal respiratory effort. Cardiovascular: quick capillary refill, normal S1/S2, RRR, no JVD, murmurs Nervous: A&O x3. no gross focal neurologic deficits, normal speech Extremities: wound of R anterior shin and left ankle/foot as seen in clinical images.  Skin: dry, intact, normal temperature, normal color. No rashes, lesions or ulcers on exposed skin Psychiatry: normal mood, congruent affect  Labs   I have personally reviewed the following labs and imaging studies CBC    Component Value Date/Time   WBC 4.3 01/21/2024 0425   RBC 2.43 (L) 01/21/2024 0425   HGB 7.5 (L) 01/21/2024 0425   HGB 13.7 11/16/2013 2040   HCT 23.5 (L) 01/21/2024 0425   HCT 40.9 11/16/2013 2040   PLT 249 01/21/2024 0425   PLT 254 11/16/2013 2040   MCV 96.7 01/21/2024 0425   MCV 94 11/16/2013 2040   MCH 30.9 01/21/2024 0425   MCHC 31.9 01/21/2024 0425   RDW 16.0 (H) 01/21/2024 0425   RDW 14.0 11/16/2013 2040   LYMPHSABS 1.3 01/20/2024 1356   LYMPHSABS 2.7 11/16/2013 2040   MONOABS 0.4 01/20/2024 1356   MONOABS 0.6 11/16/2013 2040   EOSABS 0.0 01/20/2024 1356   EOSABS 0.1 11/16/2013 2040   BASOSABS 0.0 01/20/2024 1356   BASOSABS 0.1 11/16/2013 2040   BASOSABS 0 11/15/2011  1430      Latest Ref Rng & Units 01/22/2024    9:15 AM 01/21/2024    4:25 AM 01/20/2024    8:59 PM  BMP  Glucose 70 - 99 mg/dL 856  853    BUN 8 - 23 mg/dL 22  31    Creatinine 9.55 - 1.00 mg/dL 8.44  7.83  7.69   Sodium 135 - 145 mmol/L 132  137    Potassium 3.5 - 5.1 mmol/L 3.5  4.0    Chloride 98 - 111 mmol/L 102  105    CO2 22 - 32 mmol/L 21  20    Calcium  8.9 - 10.3 mg/dL 7.5  7.9     MR BRAIN WO CONTRAST Result Date: 01/21/2024 EXAM: MRI BRAIN WITHOUT CONTRAST 01/21/2024 03:23:01 PM TECHNIQUE: Multiplanar multisequence MRI of the head/brain was performed without the administration of intravenous contrast. COMPARISON: MR Head 10/20/2013. CLINICAL HISTORY: Neuro deficit, acute, stroke suspected. AMS with medical history significant of anxiety, osteoarthritis, asthma, COPD (chronic obstructive pulmonary disease), GERD (gastroesophageal reflux disease), dyslipidemia, and HTN (hypertension).  FINDINGS: LIMITATIONS/ARTIFACTS: Motion degraded study. BRAIN AND VENTRICLES: No acute infarct. No intracranial hemorrhage. No mass. No midline shift. No hydrocephalus. The sella is unremarkable. Normal flow voids. ORBITS: No acute abnormality. SINUSES AND MASTOIDS: No acute abnormality. BONES AND SOFT TISSUES: Normal marrow signal. No acute soft tissue abnormality. IMPRESSION: 1. No acute intracranial abnormality. 2. Motion degraded study. Electronically signed by: Franky Stanford MD 01/21/2024 10:08 PM EST RP Workstation: [id number]   US  RENAL Result Date: 01/21/2024 CLINICAL DATA:  Acute kidney injury. EXAM: RENAL / URINARY TRACT ULTRASOUND COMPLETE COMPARISON:  11/01/2023 FINDINGS: Right Kidney: Renal measurements: 10.4 x 4.9 x 4.4 cm = volume: 118 mL. New mild hydronephrosis. No focal lesions identified. Left Kidney: Renal measurements: 11.7 x 5.1 x 3.9 cm = volume: 121 mL. Mild hydronephrosis. Small shadowing calculi again noted. Bladder: Very distended bladder contains some dependent debris. Other: None.  IMPRESSION: 1. New mild bilateral hydronephrosis. 2. Very distended bladder contains some dependent debris. Cause of mild bilateral hydronephrosis may be secondary to bladder distension. Consider Foley bladder decompression. 3. Small left renal calculi again noted. Electronically Signed   By: Marcey Moan M.D.   On: 01/21/2024 17:30   ECHOCARDIOGRAM COMPLETE Result Date: 01/21/2024    ECHOCARDIOGRAM REPORT   Patient Name:   Tricia [name] Date of Exam: 01/21/2024 Medical Rec #:  [id number]              Height:       68.0 in Accession #:    [id number]             Weight:       132.3 lb Date of Birth:  Sep 11, 1960              BSA:          1.714 m Patient Age:    63 years               BP:           123/60 mmHg Patient Gender: F                      HR:           87 bpm. Exam Location:  [facility] Procedure: 2D Echo, Cardiac Doppler, Color Doppler, 3D Echo and Strain Analysis            (Both Spectral and Color Flow Doppler were utilized during            procedure). Indications:     Elevated troponin  History:         Patient has prior history of Echocardiogram examinations, most                  recent 08/30/2023. COPD; Risk Factors:Hypertension and Diabetes.  Sonographer:     Christopher Furnace Referring Phys:  [id number] DAVED BROCKS [facility] Diagnosing Phys: Lonni Hanson MD  Sonographer Comments: Global longitudinal strain was attempted. IMPRESSIONS  1. Left ventricular ejection fraction, by estimation, is 55 to 60%. Left ventricular ejection fraction by 3D volume is 52 %. The left ventricle has normal function. The left ventricle has no regional wall motion abnormalities. The left ventricular internal cavity size was mildly dilated. Left ventricular diastolic parameters were normal. The average left ventricular global longitudinal strain is -10.9 %. The global longitudinal strain is abnormal.  2. Right ventricular systolic function is normal. The right ventricular size is mildly enlarged. Mildly increased right  ventricular wall thickness. There is severely elevated  pulmonary artery systolic pressure.  3. The mitral valve is degenerative. Moderate mitral valve regurgitation. Mild mitral stenosis; mitral valve gradient may be accentuated by increased flow related to mitral regurgitation.  4. The aortic valve was not well visualized. Aortic valve regurgitation is not visualized. Mild aortic valve stenosis. Aortic valve area, by VTI measures 1.87 cm. Aortic valve mean gradient measures 11.0 mmHg.  5. Mild pulmonic stenosis.  6. The inferior vena cava is normal in size with <50% respiratory variability, suggesting right atrial pressure of 8 mmHg. FINDINGS  Left Ventricle: Left ventricular ejection fraction, by estimation, is 55 to 60%. Left ventricular ejection fraction by 3D volume is 52 %. The left ventricle has normal function. The left ventricle has no regional wall motion abnormalities. The average left ventricular global longitudinal strain is -10.9 %. Strain was performed and the global longitudinal strain is abnormal. The left ventricular internal cavity size was mildly dilated. There is no left ventricular hypertrophy. Left ventricular diastolic parameters were normal. Right Ventricle: The right ventricular size is mildly enlarged. Mildly increased right ventricular wall thickness. Right ventricular systolic function is normal. There is severely elevated pulmonary artery systolic pressure. The tricuspid regurgitant velocity is 3.80 m/s, and with an assumed right atrial pressure of 8 mmHg, the estimated right ventricular systolic pressure is 65.8 mmHg. Left Atrium: Left atrial size was normal in size. Right Atrium: Right atrial size was normal in size. Pericardium: There is no evidence of pericardial effusion. Mitral Valve: The mitral valve is degenerative in appearance. There is mild thickening of the mitral valve leaflet(s). Moderate mitral valve regurgitation. Mild mitral valve stenosis. MV peak gradient, 11.3 mmHg.  The mean mitral valve gradient is 6.0 mmHg. Tricuspid Valve: The tricuspid valve is normal in structure. Tricuspid valve regurgitation is mild. Aortic Valve: The aortic valve was not well visualized. Aortic valve regurgitation is not visualized. Mild aortic stenosis is present. Aortic valve mean gradient measures 11.0 mmHg. Aortic valve peak gradient measures 18.0 mmHg. Aortic valve area, by VTI  measures 1.87 cm. Pulmonic Valve: The pulmonic valve was not well visualized. Pulmonic valve regurgitation is not visualized. Mild pulmonic stenosis. Aorta: The aortic root is normal in size and structure. Pulmonary Artery: The pulmonary artery is of normal size. Venous: The inferior vena cava is normal in size with less than 50% respiratory variability, suggesting right atrial pressure of 8 mmHg. IAS/Shunts: The interatrial septum was not well visualized. Additional Comments: 3D was performed not requiring image post processing on an independent workstation and was normal.  LEFT VENTRICLE PLAX 2D LVIDd:         5.30 cm         Diastology LVIDs:         3.50 cm         LV e' medial:    10.40 cm/s LV PW:         0.80 cm         LV E/e' medial:  13.7 LV IVS:        1.00 cm         LV e' lateral:   13.90 cm/s LVOT diam:     2.00 cm         LV E/e' lateral: 10.2 LV SV:         73 LV SV Index:   43              2D Longitudinal LVOT Area:     3.14 cm  Strain LV IVRT:       82 msec         2D Strain GLS   -10.9 %                                Avg:                                 3D Volume EF                                LV 3D EF:    Left                                             ventricul                                             ar                                             ejection                                             fraction                                             by 3D                                             volume is                                             52 %.                                  3D Volume EF:                                3D EF:        52 % RIGHT VENTRICLE RV Basal diam:  4.55 cm     PULMONARY VEINS RV S prime:     11.30 cm/s  Diastolic Velocity: 34.90 cm/s TAPSE (M-mode): 2.1 cm      S/D Velocity:       1.90  Systolic Velocity:  66.20 cm/s LEFT ATRIUM           Index        RIGHT ATRIUM           Index LA diam:      4.70 cm 2.74 cm/m   RA Area:     11.30 cm LA Vol (A2C): 24.7 ml 14.41 ml/m  RA Volume:   23.40 ml  13.65 ml/m LA Vol (A4C): 54.1 ml 31.56 ml/m  AORTIC VALVE                     PULMONIC VALVE AV Area (Vmax):    1.47 cm      PV Vmax:       2.23 m/s AV Area (Vmean):   1.42 cm      PV Vmean:      152.726 cm/s AV Area (VTI):     1.87 cm      PV VTI:        0.411 m AV Vmax:           212.00 cm/s   PV Peak grad:  19.9 mmHg AV Vmean:          150.667 cm/s  PV Mean grad:  10.5 mmHg AV VTI:            0.392 m AV Peak Grad:      18.0 mmHg AV Mean Grad:      11.0 mmHg LVOT Vmax:         99.00 cm/s LVOT Vmean:        68.100 cm/s LVOT VTI:          0.233 m LVOT/AV VTI ratio: 0.59  AORTA Ao Root diam: 2.50 cm MITRAL VALVE                  TRICUSPID VALVE MV Area (PHT): 4.49 cm       TR Peak grad:   57.8 mmHg MV Area VTI:   2.11 cm       TR Vmax:        380.00 cm/s MV Peak grad:  11.3 mmHg MV Mean grad:  6.0 mmHg       SHUNTS MV Vmax:       1.68 m/s       Systemic VTI:  0.23 m MV Vmean:      119.0 cm/s     Systemic Diam: 2.00 cm MV Decel Time: 169 msec MR Peak grad:    115.3 mmHg MR Mean grad:    80.0 mmHg MR Vmax:         537.00 cm/s MR Vmean:        423.0 cm/s MR PISA:         1.57 cm MR PISA Eff ROA: 9 mm MR PISA Radius:  0.50 cm MV E velocity: 142.00 cm/s MV A velocity: 141.00 cm/s MV E/A ratio:  1.01 Lonni End MD Electronically signed by Lonni Hanson MD Signature Date/Time: 01/21/2024/2:10:34 PM    Final    Disposition Plan & Communication  Patient status: Inpatient  Admitted From: Home Planned disposition location: Skilled nursing  facility Anticipated discharge date: 12/5 pending AKI resolution   Family Communication: none at bedside    Author: Marien LITTIE Piety, DO [facility] 01/23/2024, 7:34 AM   Available by Epic secure chat 7AM-7PM. If 7PM-7AM, please contact night-coverage.  [facility] contact information found on [email or web address].

## 2024-01-23 NOTE — TOC Progression Note (Addendum)
 Transition of Care [facility]) - Progression Note    Patient Details  Name: Tricia [name] MRN: [id number] Date of Birth: 1960/03/25  Transition of [facility]) CM/SW Contact  Dalia GORMAN Fuse, RN Phone Number: 01/23/2024, 9:36 AM  Clinical Narrative:     TOC spoke with the patient. She is currently using her a walker of her daughters, but would like her own. She has a WC, BSC, and [place]. Therapy recs are for SNF. She declined SNF, she has animals at home that need care. Although her granddaughter lives with her, she has to pay her to take care of them. Her son-in-law is a naval architect and is on the rode most of the day. The patient advised she is independent of ADLS and will reach out to her PCP if their is a decline.  The patient advised that her granddaughter will pick her up when she is medically ready for discharge.   1400: Leslie from DSS hear to visit the patient. She was able to get patient to agree to SNF. FL2 sent out in [place]  1456: [facility] offered a bed. TOC spoke with the patient and she accepted. TOC selected [facility] in the hub and made Brianna aware. Nitchia to start ins auth. TOC will continue to follow   Expected Discharge Plan: Skilled Nursing Facility Barriers to Discharge: Continued Medical Work up               Expected Discharge Plan and Services   Discharge Planning Services: CM Consult   Living arrangements for the past 2 months: Single Family Home                                       Social Drivers of Health (SDOH) Interventions SDOH Screenings   Food Insecurity: Food Insecurity Present (01/21/2024)  Housing: High Risk (01/21/2024)  Transportation Needs: Unmet Transportation Needs (01/21/2024)  Utilities: At Risk (01/21/2024)  Alcohol Screen: Low Risk  (03/07/2023)  Depression (PHQ2-9): High Risk (05/01/2023)  Financial Resource Strain: Patient Declined (06/18/2023)   Received from [facility]  Physical Activity:  Sufficiently Active (03/07/2023)  Social Connections: Socially Isolated (08/29/2023)  Stress: No Stress Concern Present (03/07/2023)  Tobacco Use: High Risk (01/20/2024)  Health Literacy: Adequate Health Literacy (03/07/2023)    Readmission Risk Interventions    11/02/2023    1:21 PM  Readmission Risk Prevention Plan  Transportation Screening Complete  Medication Review (RN Care Manager) Complete  PCP or Specialist appointment within 3-5 days of discharge Complete  SW Recovery Care/Counseling Consult Complete  Palliative Care Screening Not Applicable  Skilled Nursing Facility Not Applicable

## 2024-01-23 NOTE — Progress Notes (Signed)
 Occupational Therapy Treatment Patient Details Name: Tricia [name] MRN: [id number] DOB: January 18, 1961 Today's Date: 01/23/2024   History of present illness Pt is a 63 y.o. female admitted with AMS, sepsis, anterior right shin wound infection, acute hypoxic respiratory failure, AKI. 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   OT comments  Pt making good progress towards goals, is hopeful for discharge soon to STR. Secure chat to MD re: resting hand splint to promote optimal positioning for functional RUE as pt presents with R wrist drop, decreased sensation, decreased FMC/GMC and impaired bimanual task coordination. Splint not delivered yet, session focuses on functional mobility in room with +1 HHA CGA - MIN A steadying assist provided. Pt fatigues quickly, and performs grooming tasks once back in bed. Discharge recommendation appropriate, OT will continue to follow. Patient will benefit from continued inpatient follow up therapy, <3 hours/day       If plan is discharge home, recommend the following:  A lot of help with bathing/dressing/bathroom;A little help with walking and/or transfers;Supervision due to cognitive status;Help with stairs or ramp for entrance;Direct supervision/assist for financial management;Direct supervision/assist for medications management;Assist for transportation   Equipment Recommendations  None recommended by OT    Recommendations for Other Services      Precautions / Restrictions Precautions Precautions: Fall Recall of Precautions/Restrictions: Intact Precaution/Restrictions Comments: R wrist drop Restrictions Weight Bearing Restrictions Per Provider Order: No       Mobility Bed Mobility Overal bed mobility: Modified Independent                   Transfers Overall transfer level: Needs assistance Equipment used: None Transfers: Sit to/from Stand Sit to Stand: Contact guard assist           General transfer comment: CGA for transfers, steadying assist occassionally     Balance Overall balance assessment: Needs assistance Sitting-balance support: No upper extremity supported, Feet supported Sitting balance-Leahy Scale: Good     Standing balance support: Single extremity supported, During functional activity Standing balance-Leahy Scale: Fair Standing balance comment: requires external support                           ADL either performed or assessed with clinical judgement   ADL Overall ADL's : Needs assistance/impaired                                     Functional mobility during ADLs: Contact guard assist;Minimal assistance General ADL Comments: Discussed use of R hand splint with patient (splint not delivered yet, will f/u in next session) for improved positioning and support of dominant RUE. Pt agreeable to mobilize short distance in room with +1 HHA  MIN A - CGA. Pt fatigues quickly and reaches for external support    Extremity/Trunk Assessment Upper Extremity Assessment RUE Coordination: decreased gross motor;decreased fine motor                     Communication Communication Communication: No apparent difficulties Factors Affecting Communication: Difficulty expressing self   Cognition Arousal: Alert Behavior During Therapy: WFL for tasks assessed/performed               OT - Cognition Comments: pt very  pleasant and remembers author from previous sessions                 Following commands: Impaired Following commands impaired: Follows one step commands with increased time      Cueing   Cueing Techniques: Verbal cues, Gestural cues        General Comments Secure chat to MD re: ordering resting hand splint to promote optimal positioning of RUE.     Pertinent Vitals/ Pain       Pain Assessment Pain Assessment: No/denies pain   Frequency  Min 2X/week        Progress Toward Goals  OT Goals(current goals can now be found in the care plan section)  Progress towards OT goals: Progressing toward goals  Acute Rehab OT Goals OT Goal Formulation: With patient Time For Goal Achievement: 02/04/24 Potential to Achieve Goals: Good ADL Goals Pt Will Perform Grooming: sitting;with modified independence Pt Will Perform Upper Body Dressing: sitting;with modified independence Pt Will Perform Lower Body Dressing: sitting/lateral leans;sit to/from stand;with modified independence Pt Will Transfer to Toilet: ambulating;regular height toilet;with modified independence Pt Will Perform Toileting - Clothing Manipulation and hygiene: with modified independence;sitting/lateral leans;sit to/from stand Pt/caregiver will Perform Home Exercise Program: Increased ROM;Right Upper extremity;With written HEP provided;With Supervision  Plan         AM-PAC OT 6 Clicks Daily Activity     Outcome Measure   Help from another person eating meals?: None Help from another person taking care of personal grooming?: None Help from another person toileting, which includes using toliet, bedpan, or urinal?: A Lot Help from another person bathing (including washing, rinsing, drying)?: A Lot Help from another person to put on and taking off regular upper body clothing?: A Little Help from another person to put on and taking off regular lower body clothing?: A Lot 6 Click Score: 17    End of Session    OT Visit Diagnosis: Muscle weakness (generalized) (M62.81);Repeated falls (R29.6);History of falling (Z91.81);Unsteadiness on feet (R26.81)   Activity Tolerance Patient tolerated treatment well   Patient Left in bed;with call bell/phone within reach;with bed alarm set   Nurse Communication Mobility status        Time: 8461-8447 OT Time Calculation  (min): 14 min  Charges: OT General Charges $OT Visit: 1 Visit OT Treatments $Therapeutic Activity: 8-22 mins  Ryin Schillo L. Leo Weyandt, OTR/L  01/23/24, 5:00 PM

## 2024-01-24 ENCOUNTER — Inpatient Hospital Stay

## 2024-01-24 DIAGNOSIS — A419 Sepsis, unspecified organism: Secondary | ICD-10-CM | POA: Diagnosis not present

## 2024-01-24 DIAGNOSIS — L0390 Cellulitis, unspecified: Secondary | ICD-10-CM | POA: Diagnosis not present

## 2024-01-24 LAB — CBC
HCT: 27 % — ABNORMAL LOW (ref 36.0–46.0)
Hemoglobin: 8.9 g/dL — ABNORMAL LOW (ref 12.0–15.0)
MCH: 30.6 pg (ref 26.0–34.0)
MCHC: 33 g/dL (ref 30.0–36.0)
MCV: 92.8 fL (ref 80.0–100.0)
Platelets: 213 K/uL (ref 150–400)
RBC: 2.91 MIL/uL — ABNORMAL LOW (ref 3.87–5.11)
RDW: 15 % (ref 11.5–15.5)
WBC: 7.4 K/uL (ref 4.0–10.5)
nRBC: 0 % (ref 0.0–0.2)

## 2024-01-24 LAB — VITAMIN A: Vitamin A (Retinoic Acid): 11.5 ug/dL — ABNORMAL LOW (ref 22.0–69.5)

## 2024-01-24 LAB — BASIC METABOLIC PANEL WITH GFR
Anion gap: 10 (ref 5–15)
BUN: 17 mg/dL (ref 8–23)
CO2: 24 mmol/L (ref 22–32)
Calcium: 7.4 mg/dL — ABNORMAL LOW (ref 8.9–10.3)
Chloride: 102 mmol/L (ref 98–111)
Creatinine, Ser: 1.31 mg/dL — ABNORMAL HIGH (ref 0.44–1.00)
GFR, Estimated: 46 mL/min — ABNORMAL LOW (ref >60)
Glucose, Bld: 106 mg/dL — ABNORMAL HIGH (ref 70–99)
Potassium: 3.2 mmol/L — ABNORMAL LOW (ref 3.5–5.1)
Sodium: 136 mmol/L (ref 135–145)

## 2024-01-24 MED ORDER — POTASSIUM CHLORIDE CRYS ER 20 MEQ PO TBCR
40.0000 meq | EXTENDED_RELEASE_TABLET | Freq: Once | ORAL | Status: AC
  Administered 2024-01-24: 40 meq via ORAL
  Filled 2024-01-24: qty 2

## 2024-01-24 MED ORDER — GADOBUTROL 1 MMOL/ML IV SOLN
6.0000 mL | Freq: Once | INTRAVENOUS | Status: AC | PRN
  Administered 2024-01-25: 6 mL via INTRAVENOUS

## 2024-01-24 MED ORDER — K PHOS MONO-SOD PHOS DI & MONO 155-852-130 MG PO TABS
500.0000 mg | ORAL_TABLET | ORAL | Status: AC
  Administered 2024-01-24 – 2024-01-25 (×3): 500 mg via ORAL
  Filled 2024-01-24 (×3): qty 2

## 2024-01-24 MED ORDER — SODIUM PHOSPHATES 45 MMOLE/15ML IV SOLN: 30.0000 mmol | Freq: Once | INTRAVENOUS | Status: DC

## 2024-01-24 NOTE — Plan of Care (Signed)
  Problem: Health Behavior/Discharge Planning: Goal: Ability to manage health-related needs will improve Outcome: Progressing   Problem: Clinical Measurements: Goal: Will remain free from infection Outcome: Progressing Goal: Cardiovascular complication will be avoided Outcome: Progressing   Problem: Activity: Goal: Risk for activity intolerance will decrease Outcome: Progressing   

## 2024-01-24 NOTE — Progress Notes (Signed)
 Physical Therapy Treatment Patient Details Name: Tricia [name] MRN: [id number] DOB: 1960-04-05 Today's Date: 01/24/2024   History of Present Illness Pt is a 63 y.o. female admitted with AMS, sepsis, anterior right shin wound infection, acute hypoxic respiratory failure, AKI. 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    PT Comments  Pt received in bed, discussed new onset of Right wrist drop which pt states occurred ~1 week ago with unknown etiology. Recent Brain MRI negative. Still awaiting delivery of wrist splint. Pt struggles with transfers and overall general function as this is her dominant hand. Simulated splint applied to maintain joint integrity prior to OOB. Pt and nursing notified application can be removed at any time. Pt wishes to return home at d/c however lives alone and currently demonstrates decreased strength throughout with fall risk concerns while ambulating. Pt will benefit from STR to regain prior independent function and learn how to adapt during daily activities without the function of dominant R hand/wrist.     If plan is discharge home, recommend the following: A little help with walking and/or transfers;A little help with bathing/dressing/bathroom;Assistance with cooking/housework;Assist for transportation;Help with stairs or ramp for entrance   Can travel by private vehicle     Yes  Equipment Recommendations  None recommended by PT (TBD at next level of care with consideration of new onset of R wrist drop)    Recommendations for Other Services       Precautions / Restrictions Precautions Precautions: Fall Recall of Precautions/Restrictions: Intact Precaution/Restrictions Comments: R wrist drop Required Braces or Orthoses: Splint/Cast Splint/Cast: Ordered  x 2 days, have not received Restrictions Weight Bearing Restrictions Per Provider Order: No     Mobility  Bed Mobility Overal bed mobility: Modified Independent Bed Mobility: Supine to Sit     Supine to sit: Contact guard Sit to supine: Used rails   General bed mobility comments:  (Right wrist make shift splint applied to prevent injury during mobility)    Transfers Overall transfer level: Needs assistance Equipment used: 1 person hand held assist Transfers: Sit to/from Stand Sit to Stand: Contact guard assist           General transfer comment: able to stand with CGA, required HHA to safely ambulate within the room with occasional scissoring/narrow BOS during ambulation reaching for external support    Ambulation/Gait Ambulation/Gait assistance: Contact guard assist Gait Distance (Feet): 30 Feet Assistive device: IV Pole Gait Pattern/deviations: Step-through pattern, Narrow base of support Gait velocity: decreased     General Gait Details:  (Slightly unsteady, but no true LOB with short distance gait.)   Stairs Stairs:  (Pt has a ramp and electric w/c at home)           Wheelchair Mobility     Tilt Bed    Modified Rankin (Stroke Patients Only)       Balance                                            Communication Communication Communication: No apparent difficulties Factors Affecting Communication: Difficulty expressing self  Cognition Arousal: Alert Behavior During Therapy: WFL for tasks assessed/performed   PT - Cognitive impairments: No family/caregiver present to determine baseline  PT - Cognition Comments: oriented to self, place, and time. Decreased awareness into situation and deficits. Following commands: Impaired Following commands impaired: Follows one step commands with increased time    Cueing Cueing Techniques: Verbal cues, Gestural cues  Exercises      General Comments  General comments (skin integrity, edema, etc.): PT made makeshift R hand splint since resting hand splint still not here from hanger      Pertinent Vitals/Pain Pain Assessment Pain Assessment: No/denies pain    Home Living                          Prior Function            PT Goals (current goals can now be found in the care plan section) Acute Rehab PT Goals Patient Stated Goal: did not state    Frequency    Min 2X/week      PT Plan      Co-evaluation              AM-PAC PT 6 Clicks Mobility   Outcome Measure  Help needed turning from your back to your side while in a flat bed without using bedrails?: A Little Help needed moving from lying on your back to sitting on the side of a flat bed without using bedrails?: A Little Help needed moving to and from a bed to a chair (including a wheelchair)?: A Little Help needed standing up from a chair using your arms (e.g., wheelchair or bedside chair)?: A Little Help needed to walk in hospital room?: A Little Help needed climbing 3-5 steps with a railing? : A Lot 6 Click Score: 17    End of Session Equipment Utilized During Treatment: Gait belt Activity Tolerance: Patient tolerated treatment well Patient left: in chair;with call bell/phone within reach;with chair alarm set Nurse Communication: Mobility status;Other (comment) (? arrival of Right wrist splint) PT Visit Diagnosis: Unsteadiness on feet (R26.81);Muscle weakness (generalized) (M62.81);Other abnormalities of gait and mobility (R26.89);History of falling (Z91.81)     Time: 1310-1331 PT Time Calculation (min) (ACUTE ONLY): 21 min  Charges:    $Therapeutic Activity: 8-22 mins PT General Charges $$ ACUTE PT VISIT: 1 Visit                    Darice Bohr, PTA  Darice JAYSON Bohr 01/24/2024, 4:13 PM

## 2024-01-24 NOTE — Progress Notes (Signed)
 PROGRESS NOTE    Tricia [name]  FMW:[id number] DOB: Dec 08, 1960 DOA: 01/20/2024 PCP: Glenard Mire, MD  109A/109A-AA  LOS: 4 days   Brief hospital course:   Assessment & Plan: Tricia [name] is a 63 y.o. female with a PMH significant for anxiety, osteoarthritis, asthma, COPD, GERD, dyslipidemia, and HTN.   Presents with SOB and wheezing. Also with AMS, thought to be secondary to opioids.  Recent hospitalization in September 2025 secondary to bacteremia with a chronic wound on anterior right shin.  There was a dirty dressing on it on arrival.   On presentation febrile at 100.9, sinus tachycardia, saturating appropriately on 4 L of oxygen .  Labs with no leukocytosis, BUN of 28, creatinine 2.41 with baseline around 1.  Troponin peaked at 164, BNP greater than 16,000.  She has a fairly unremarkable echo in July 2025. Chest x-ray of was negative for any acute abnormality.  COVID/flu/RSV negative.  TB and fibula imaging with soft tissue edema, no osseous lesion.   Patient was started on cefepime  and vancomycin  for sepsis.    Sepsis due to cellulitis (HCC) Chronic leg wound.  --cont cefepime  --cont wound case   AMS - resolved back to baseline. Brain MRI negative acute. Likely secondary to opioid use with constricted pupils and positive UDS on admission and resolved with holding of the meds.  - cautiously continue pain medications and other sedating medications    Acute kidney injury superimposed on chronic kidney disease 3a Acute on chronic Urinary retention.   Bilateral hydronephrosis seen on renal US . Scant blood in urine collection bag. --cont Foley    Elevated troponin No chest pain, likely secondary to demand ischemia.  Echo done in July 2025 was fairly unremarkable. - Repeat echo done- unremarkable    Chronic obstructive pulmonary disease (COPD) (HCC) 2-3L O2 PRN Patient was initially placed on 4 L of oxygen  in ED with no documented hypoxia.  --cont  bronchodilators     Parkinson's disease (HCC) - Continue home Sinemet    Diabetes mellitus with complication  Last A1c of 5.1.  Patient was not on any medications at home.  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 Had 2 remaining sutures. Removed during this hospitalization.   GERD (gastroesophageal reflux disease) - Continue PPI  Hypokalemia --monitor and supplement PRN   DVT prophylaxis: Lovenox  SQ Code Status: Full code  Family Communication:  Level of care: Telemetry Dispo:   The patient is from: home Anticipated d/c is to: SNF rehab Anticipated d/c date is: whenever SNF accepts   Subjective and Interval History:  Pt reported no pain in her leg wounds.  Continued to have right hand drop.   Objective: Vitals:   01/24/24 0750 01/24/24 1139 01/24/24 1754 01/24/24 1938  BP: 139/65 134/62 127/68 119/63  Pulse: 86 94 95 87  Resp: 16 17 17 17   Temp: 97.9 F (36.6 C) 98.1 F (36.7 C)  97.8 F (36.6 C)  TempSrc: Oral Oral  Oral  SpO2: 98% 95% 100% 97%  Weight:      Height:        Intake/Output Summary (Last 24 hours) at 01/24/2024 2016 Last data filed at 01/24/2024 1900 Gross per 24 hour  Intake 800 ml  Output 1000 ml  Net -200 ml   Filed Weights   01/20/24 1350 01/21/24 0731  Weight: 60 kg 60 kg    Examination:   Constitutional: NAD, AAOx3 HEENT: conjunctivae and lids normal, EOMI CV: No cyanosis.  RESP: normal respiratory effort Neuro: II - XII grossly intact.   Psych: Normal mood and affect.  Appropriate judgement and reason Foley present   Data Reviewed: I have personally reviewed labs and imaging studies  Time spent: 50 minutes  Ellouise Haber, MD [facility] If 7PM-7AM, please contact night-coverage 01/24/2024, 8:16 PM

## 2024-01-24 NOTE — Progress Notes (Signed)
 Occupational Therapy Treatment Patient Details Name: Tricia [name] MRN: [id number] DOB: 07/30/1960 Today's Date: 01/24/2024   History of present illness Pt is a 63 y.o. female admitted with AMS, sepsis, anterior right shin wound infection, acute hypoxic respiratory failure, AKI. 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   OT comments  Pt is seated in recliner on arrival. Pleasant and agreeable to OT session. She did not complain of pain during session. Pt performed STS from recliner and EOB during session with CGA, required Min A via HHA during ambulation bouts with narrow BOS/scissoring gait at times requiring external support to prevent fall. Pt often reaching out for walls/furniture for support as well. Continues issues with R wrist drop with limited coordination and functional use of her dominant R hand. Still awaiting resting hand splint. Standing marches performed x10 reps to maximize strength/endurance. Pt edu on benefits of STR to return home safely. Pt returned to bed with all needs in place and will cont to require skilled acute OT services to maximize his safety and IND to return to PLOF.       If plan is discharge home, recommend the following:  A lot of help with bathing/dressing/bathroom;A little help with walking and/or transfers;Supervision due to cognitive status;Help with stairs or ramp for entrance;Direct supervision/assist for financial management;Direct supervision/assist for medications management;Assist for transportation   Equipment Recommendations  None recommended by OT    Recommendations for Other Services      Precautions / Restrictions Precautions Precautions: Fall Recall of Precautions/Restrictions: Intact Precaution/Restrictions Comments: R wrist  drop Restrictions Weight Bearing Restrictions Per Provider Order: No       Mobility Bed Mobility Overal bed mobility: Modified Independent         Sit to supine: Modified independent (Device/Increase time)   General bed mobility comments: no assist to return to supine    Transfers Overall transfer level: Needs assistance Equipment used: 1 person hand held assist Transfers: Sit to/from Stand             General transfer comment: able to stand with CGA, required HHA to safely ambulate within the room with occasional scissoring/narrow BOS during ambulation reaching for external support     Balance Overall balance assessment: Needs assistance Sitting-balance support: No upper extremity supported, Feet supported Sitting balance-Leahy Scale: Good     Standing balance support: Single extremity supported, During functional activity Standing balance-Leahy Scale: Poor Standing balance comment: requires external support x1 to maintain balance and prevent falls                           ADL either performed or assessed with clinical judgement   ADL Overall ADL's : Needs assistance/impaired Eating/Feeding: Supervision/ safety;Sitting;Bed level Eating/Feeding Details (indicate cue type and reason): able to grasp cup with digits on R hand to bring cup to L hand to drink                                   General ADL Comments: anticipate continued MOd/Max A for ADL management d/t R hand deficits    Extremity/Trunk Assessment Upper Extremity Assessment Upper Extremity Assessment: Right hand dominant;RUE deficits/detail;Generalized weakness RUE Coordination: decreased gross motor;decreased fine motor   Lower Extremity  Assessment Lower Extremity Assessment: Generalized weakness        Vision       Perception     Praxis     Communication Communication Communication: No apparent difficulties Factors Affecting Communication: Difficulty  expressing self   Cognition Arousal: Alert Behavior During Therapy: WFL for tasks assessed/performed               OT - Cognition Comments: pt very pleasant and remembers author from previous sessions                 Following commands: Impaired Following commands impaired: Follows one step commands with increased time      Cueing   Cueing Techniques: Verbal cues, Gestural cues  Exercises      Shoulder Instructions       General Comments PT made makeshift R hand splint since resting hand splint still not here from hanger    Pertinent Vitals/ Pain       Pain Assessment Pain Assessment: No/denies pain  Home Living                                          Prior Functioning/Environment              Frequency  Min 2X/week        Progress Toward Goals  OT Goals(current goals can now be found in the care plan section)  Progress towards OT goals: Progressing toward goals  Acute Rehab OT Goals OT Goal Formulation: With patient Time For Goal Achievement: 02/04/24 Potential to Achieve Goals: Good  Plan      Co-evaluation                 AM-PAC OT 6 Clicks Daily Activity     Outcome Measure   Help from another person eating meals?: None Help from another person taking care of personal grooming?: None Help from another person toileting, which includes using toliet, bedpan, or urinal?: A Lot Help from another person bathing (including washing, rinsing, drying)?: A Lot Help from another person to put on and taking off regular upper body clothing?: A Little Help from another person to put on and taking off regular lower body clothing?: A Lot 6 Click Score: 17    End of Session Equipment Utilized During Treatment: Gait belt  OT Visit Diagnosis: Muscle weakness (generalized) (M62.81);Repeated falls (R29.6);History of falling (Z91.81);Unsteadiness on feet (R26.81)   Activity Tolerance Patient tolerated treatment well    Patient Left in bed;with call bell/phone within reach;with bed alarm set   Nurse Communication Mobility status        Time: 8475-8454 OT Time Calculation (min): 21 min  Charges: OT General Charges $OT Visit: 1 Visit OT Treatments $Therapeutic Exercise: 8-22 mins  Zoi Devine Chrismon, OTR/L  01/24/24, 4:12 PM   Vanya Carberry E Chrismon 01/24/2024, 4:09 PM

## 2024-01-24 NOTE — TOC Progression Note (Addendum)
 Transition of Care [facility]) - Progression Note    Patient Details  Name: Tricia [name] MRN: [id number] Date of Birth: [birth year]  [facility] CM/SW Contact  Dalia GORMAN Fuse, RN Phone Number: 01/24/2024, 10:12 AM  Clinical Narrative:     [name] received for [facility].   Approved PlanAuthID:[id number] Dates:12/5-12/10/2023 Next Review Date: 01/28/2024   TOC will continue to follow.  Expected Discharge Plan: Skilled Nursing Facility Barriers to Discharge: Continued Medical Work up               Expected Discharge Plan and Services   Discharge Planning Services: CM Consult   Living arrangements for the past 2 months: Single Family Home                                       Social Drivers of Health (SDOH) Interventions SDOH Screenings   Food Insecurity: Food Insecurity Present (01/21/2024)  Housing: High Risk (01/21/2024)  Transportation Needs: Unmet Transportation Needs (01/21/2024)  Utilities: At Risk (01/21/2024)  Alcohol Screen: Low Risk  (03/07/2023)  Depression (PHQ2-9): High Risk (05/01/2023)  Financial Resource Strain: Patient Declined (06/18/2023)   Received from [facility]  Physical Activity: Sufficiently Active (03/07/2023)  Social Connections: Socially Isolated (08/29/2023)  Stress: No Stress Concern Present (03/07/2023)  Tobacco Use: High Risk (01/20/2024)  Health Literacy: Adequate Health Literacy (03/07/2023)    Readmission Risk Interventions    11/02/2023    1:21 PM  Readmission Risk Prevention Plan  Transportation Screening Complete  Medication Review (RN Care Manager) Complete  PCP or Specialist appointment within 3-5 days of discharge Complete  SW Recovery Care/Counseling Consult Complete  Palliative Care Screening Not Applicable  Skilled Nursing Facility Not Applicable

## 2024-01-25 DIAGNOSIS — L0390 Cellulitis, unspecified: Secondary | ICD-10-CM | POA: Diagnosis not present

## 2024-01-25 DIAGNOSIS — A419 Sepsis, unspecified organism: Secondary | ICD-10-CM | POA: Diagnosis not present

## 2024-01-25 LAB — BASIC METABOLIC PANEL WITH GFR
Anion gap: 9 (ref 5–15)
BUN: 12 mg/dL (ref 8–23)
CO2: 21 mmol/L — ABNORMAL LOW (ref 22–32)
Calcium: 7.5 mg/dL — ABNORMAL LOW (ref 8.9–10.3)
Chloride: 106 mmol/L (ref 98–111)
Creatinine, Ser: 1.13 mg/dL — ABNORMAL HIGH (ref 0.44–1.00)
GFR, Estimated: 54 mL/min — ABNORMAL LOW (ref >60)
Glucose, Bld: 118 mg/dL — ABNORMAL HIGH (ref 70–99)
Potassium: 4.2 mmol/L (ref 3.5–5.1)
Sodium: 136 mmol/L (ref 135–145)

## 2024-01-25 LAB — PHOSPHORUS: Phosphorus: 3.3 mg/dL (ref 2.5–4.6)

## 2024-01-25 LAB — CULTURE, BLOOD (ROUTINE X 2)
Culture: NO GROWTH
Culture: NO GROWTH

## 2024-01-25 LAB — MAGNESIUM: Magnesium: 1.9 mg/dL (ref 1.7–2.4)

## 2024-01-25 NOTE — Plan of Care (Signed)
  Problem: Education: Goal: Knowledge of General Education information will improve Description: Including pain rating scale, medication(s)/side effects and non-pharmacologic comfort measures Outcome: Progressing   Problem: Health Behavior/Discharge Planning: Goal: Ability to manage health-related needs will improve Outcome: Progressing   Problem: Activity: Goal: Risk for activity intolerance will decrease Outcome: Progressing   Problem: Nutrition: Goal: Adequate nutrition will be maintained Outcome: Progressing   Problem: Coping: Goal: Level of anxiety will decrease Outcome: Progressing   Problem: Elimination: Goal: Will not experience complications related to bowel motility Outcome: Progressing   Problem: Pain Managment: Goal: General experience of comfort will improve and/or be controlled Outcome: Progressing   Problem: Safety: Goal: Ability to remain free from injury will improve Outcome: Progressing

## 2024-01-25 NOTE — Progress Notes (Addendum)
 PROGRESS NOTE    [name]  FMW:[id number] DOB: Apr 10, 1960 DOA: 01/20/2024 PCP: Glenard Mire, MD  109A/109A-AA  LOS: 5 days   Brief hospital course:   Assessment & Plan: Tricia [name] is a 63 y.o. female with a PMH significant for anxiety, osteoarthritis, asthma, COPD, GERD, dyslipidemia, and HTN.   Presents with SOB and wheezing. Also with AMS, thought to be secondary to opioids.  Recent hospitalization in September 2025 secondary to bacteremia with a chronic wound on anterior right shin.  There was a dirty dressing on it on arrival.   On presentation febrile at 100.9, sinus tachycardia, saturating appropriately on 4 L of oxygen .  Labs with no leukocytosis, BUN of 28, creatinine 2.41 with baseline around 1.  Troponin peaked at 164, BNP greater than 16,000.  She has a fairly unremarkable echo in July 2025. Chest x-ray of was negative for any acute abnormality.  COVID/flu/RSV negative.  TB and fibula imaging with soft tissue edema, no osseous lesion.   Patient was started on cefepime  and vancomycin  for sepsis.    Sepsis due to cellulitis (HCC) Chronic leg wound.  --cont cefepime  --cont wound case   AMS - resolved back to baseline. Brain MRI negative acute. Likely secondary to opioid use with constricted pupils and positive UDS on admission and resolved with holding of the meds.  - cautiously continue pain medications and other sedating medications    Acute kidney injury superimposed on chronic kidney disease 3a Acute on chronic Urinary retention.   Bilateral hydronephrosis seen on renal US . Scant blood in urine collection bag. --cont Foley   Elevated troponin No chest pain, likely secondary to demand ischemia.  Echo done in July 2025 was fairly unremarkable. - Repeat echo done- unremarkable    Chronic obstructive pulmonary disease (COPD) (HCC) 2-3L O2 PRN Patient was initially placed on 4 L of oxygen  in ED with no documented hypoxia.  --cont  bronchodilators     Parkinson's disease (HCC) - Continue home Sinemet    Diabetes mellitus with complication  Last A1c of 5.1.  Patient was not on any medications at home.  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 Had 2 remaining sutures. Removed during this hospitalization.   GERD (gastroesophageal reflux disease) - Continue PPI  Hypokalemia --monitor and supplement PRN  Right hand drop Cervical MRI showed stenosis at C3-C4 and C5-C6 --outpatient referral to neurosurgery   DVT prophylaxis: Lovenox  SQ Code Status: Full code  Family Communication: son-in-law updated on the phone today Level of care: Telemetry Dispo:   The patient is from: home Anticipated d/c is to: home.  Declined SNF rehab Anticipated d/c date is: tomorrow   Subjective and Interval History:  Pt said she didn't want to go to SNF and wanted to go home instead.     Objective: Vitals:   01/25/24 0852 01/25/24 1652 01/25/24 1959 01/25/24 2009  BP: (!) 107/54 (!) 106/59 113/60   Pulse: 81 98 90   Resp: 18 18 18    Temp: 97.8 F (36.6 C) 98.1 F (36.7 C) 98 F (36.7 C)   TempSrc:  Oral Oral   SpO2: 100% 95% 99% 96%  Weight:      Height:        Intake/Output Summary (Last 24 hours) at 01/25/2024 2024 Last data filed at 01/25/2024 1700 Gross per 24 hour  Intake 480 ml  Output 1800 ml  Net -1320 ml   Filed Weights   01/20/24 1350 01/21/24 0731  Weight: 60 kg 60 kg    Examination:   Constitutional: NAD, AAOx3 HEENT: conjunctivae and lids normal, EOMI CV: No cyanosis.   RESP: normal respiratory effort Neuro: II - XII grossly intact.    Foley present   Data Reviewed: I have personally reviewed labs and imaging studies  Time spent: 35 minutes  Ellouise Haber, MD [facility] If 7PM-7AM, please contact night-coverage 01/25/2024, 8:24 PM

## 2024-01-26 DIAGNOSIS — A419 Sepsis, unspecified organism: Secondary | ICD-10-CM | POA: Diagnosis not present

## 2024-01-26 DIAGNOSIS — L0390 Cellulitis, unspecified: Secondary | ICD-10-CM | POA: Diagnosis not present

## 2024-01-26 MED ORDER — ASCORBIC ACID 500 MG PO TABS: 500.0000 mg | ORAL_TABLET | Freq: Two times a day (BID) | ORAL | Status: AC

## 2024-01-26 MED ORDER — ZINC SULFATE 220 (50 ZN) MG PO CAPS: 220.0000 mg | ORAL_CAPSULE | Freq: Every day | ORAL | Status: AC

## 2024-01-26 MED ORDER — HYDROXYZINE HCL 25 MG PO TABS: 25.0000 mg | ORAL_TABLET | Freq: Two times a day (BID) | ORAL | Status: AC | PRN

## 2024-01-26 MED ORDER — COLLAGENASE 250 UNIT/GM EX OINT: TOPICAL_OINTMENT | Freq: Every day | CUTANEOUS | 2 refills | Status: AC

## 2024-01-26 NOTE — Progress Notes (Signed)
 Mobility Specialist Progress Note:    01/26/24 0810  Mobility  Activity Stood at bedside;Pivoted/transferred from bed to chair  Level of Assistance Minimal assist, patient does 75% or more  Assistive Device  (HHA)  Distance Ambulated (ft) 3 ft  Range of Motion/Exercises Active;All extremities  Activity Response Tolerated well  Mobility visit 1 Mobility  Mobility Specialist Start Time (ACUTE ONLY) [id number]  Mobility Specialist Stop Time (ACUTE ONLY) 0806  Mobility Specialist Time Calculation (min) (ACUTE ONLY) 14 min   Pt received in bed, agreeable to mobility. Required MinA to stand with 1 person hand-held assist. Upon standing, pt became dizzy and has LOB x2 requiring MinA to correct. Deferred further ambulation. BP 152/72, HR 80 bpm and SpO2 97% on RA. Left pt in chair with alarm on and belongings in reach. Nurse aware and at bedside, all needs met.  Sherrilee Ditty Mobility Specialist Please contact via Special Educational Needs Teacher or  Rehab office at [phone]

## 2024-01-26 NOTE — Progress Notes (Signed)
    Durable Medical Equipment  (From admission, onward)           Start     Ordered   01/26/24 1059  For home use only DME Walker rolling  Once       Question Answer Comment  Walker: With 5 Inch Wheels   Patient needs a walker to treat with the following condition Weakness      01/26/24 1058   01/26/24 1059  For home use only DME Wheelchair electric  Once        01/26/24 1058

## 2024-01-26 NOTE — Discharge Instructions (Signed)
 Home Health services for PT/OT/RN and Aide was set up and arranged with [facility]. They will give you a call within 48 hours after discharge.

## 2024-01-26 NOTE — TOC Transition Note (Addendum)
 Transition of Care [facility]) - Progression Note    Patient Details  Name: Tricia [name] MRN: [id number] Date of Birth: 1960-07-10  [facility] CM/SW Contact  Wai Litt L Aasiya [place]ase[place], [place] Phone Number: 01/26/2024, 10:53 AM  Clinical Narrative:     Patient being discharged home. She is denying SNF placement. Home Health set up and arranged with [facility] accepted referral in the portal. CSW reviewed choice with patient and she was agreeable. Patient requested DME although none was recommended by PT/OT. CSW notified the attending of the request.   DME orders entered. [facility] notified of orders for home delivery. Order has been received and will be processed in the order in which it was received.    TOC signing off.     Expected Discharge Plan: Skilled Nursing Facility Barriers to Discharge: Continued Medical Work up               Expected Discharge Plan and Services   Discharge Planning Services: CM Consult   Living arrangements for the past 2 months: Single Family Home Expected Discharge Date: 01/26/24                                     Social Drivers of Health (SDOH) Interventions SDOH Screenings   Food Insecurity: Food Insecurity Present (01/21/2024)  Housing: High Risk (01/21/2024)  Transportation Needs: Unmet Transportation Needs (01/21/2024)  Utilities: At Risk (01/21/2024)  Alcohol Screen: Low Risk  (03/07/2023)  Depression (PHQ2-9): High Risk (05/01/2023)  Financial Resource Strain: Patient Declined (06/18/2023)   Received from [facility]  Physical Activity: Sufficiently Active (03/07/2023)  Social Connections: Socially Isolated (08/29/2023)  Stress: No Stress Concern Present (03/07/2023)  Tobacco Use: High Risk (01/20/2024)  Health Literacy: Adequate Health Literacy (03/07/2023)    Readmission Risk Interventions    11/02/2023    1:21 PM  Readmission Risk Prevention Plan  Transportation Screening Complete  Medication  Review (RN Care Manager) Complete  PCP or Specialist appointment within 3-5 days of discharge Complete  SW Recovery Care/Counseling Consult Complete  Palliative Care Screening Not Applicable  Skilled Nursing Facility Not Applicable

## 2024-01-26 NOTE — Discharge Summary (Addendum)
**Note Tricia-Identified via Obfuscation**  "  Physician Discharge Summary   Tricia [name]  female DOB: June 04, 1960  FMW:[id number]  PCP: Sowles, Krichna, MD  Admit date: 01/20/2024 Discharge date: 01/26/2024  Admitted From: home Disposition:  home.  Declined SNF rehab. Home Health: Yes CODE STATUS: Full code  Discharge Instructions     Ambulatory referral to Neurosurgery   Complete by: As directed    Cervical stenosis at C3-C4 and C5-C6   Ambulatory referral to Urology   Complete by: As directed    Acute urinary retention   Discharge instructions   Complete by: As directed    Please follow up with urology outpatient for management of your Foley cathether.  Please follow up with neurosurgery outpatient for management of your right hand drop. - -   Discharge wound care:   Complete by: As directed    Daily wound care.  Cleanse right and left lower leg wounds with Vashe, do not rinse.  Apply 1/4 thick layer of Santyl  to wound beds daily, top with saline moist gauze and dry gauze. Secure with silicone foam or Kerlix roll gauze anchored around foot which works best.   Cleanse B knee wounds with Vashe, apply xeroform gauze Soila [phone]) to wound beds daily and secure with silicone foam. [facility] Course:  For full details, please see H&P, progress notes, consult notes and ancillary notes.  Briefly,  Tricia [name] is a 63 y.o. female with a PMH significant for anxiety, osteoarthritis, asthma, COPD, and HTN who presented with SOB and wheezing, and with AMS, thought to be secondary to opioids.   Recent hospitalization in September 2025 secondary to bacteremia with a chronic wound on anterior right shin.     On presentation febrile at 100.9, sinus tachycardia.  Chest x-ray was negative for any acute abnormality.  COVID/flu/RSV negative.  Tibia and fibula imaging with soft tissue edema, no osseous lesion.   Patient was started on cefepime  and vancomycin  for sepsis.    Sepsis due to cellulitis  (HCC) Chronic leg wounds --wound over right anterior shin worse than left shin. --Pt completed 7 days of cefepime  flr cellulitis of anterior shins.   --cont wound care as instructed above.   Acute Toxic encephalopathy  - resolved back to baseline. Brain MRI negative acute. Likely secondary to opioid use with constricted pupils and positive UDS on admission and resolved with holding of the meds.   Acute kidney injury superimposed on chronic kidney disease 3a Acute on chronic Urinary retention.   Bilateral hydronephrosis seen on renal US . Scant blood in urine collection bag. --Cr 2.41 on presentation, improved to 1.13 prior to discharge. --Pt discharged with Foley.  Will need outpatient urology f/u for further management.   Elevated troponin No chest pain, likely secondary to demand ischemia.  Current echo done- unremarkable    Chronic obstructive pulmonary disease (COPD) (HCC) 2-3L O2 PRN Patient was initially placed on 4 L of oxygen  in ED with no documented hypoxia.  --cont bronchodilators   Acute hypoxic respiratory failure, ruled out  --no documented hypoxia.    Parkinson's disease (HCC) - Continue home Sinemet    Diabetes mellitus with complication  Last A1c of 5.1.  Patient was not on any medications at home.   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 Had 2 remaining sutures, removed during this hospitalization.   GERD (gastroesophageal reflux disease) - Continue PPI   Hypokalemia --monitored and supplemented PRN   Right  hand drop --a recent development per pt.  Cervical MRI showed stenosis at C3-C4 and C5-C6 --outpatient referral to neurosurgery  Not taking torsemide  PTA  Unless noted above, medications under STOP list are ones pt was not taking PTA.  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 The score is based on dignosis, age, lab data, medications, orders, and past utilization.   Low:  0-14.9   Medium: 15-21.9   High: 22-29.9   Extreme: 30 and above         Discharge Instructions:  Allergies as of 01/26/2024       Reactions   Augmentin  [amoxicillin -pot Clavulanate] Diarrhea   Penicillins Itching        Medication List     STOP taking these medications    enoxaparin  40 MG/0.4ML injection Commonly known as: LOVENOX    leptospermum manuka honey Pste paste   torsemide  20 MG tablet Commonly known as: DEMADEX        TAKE these medications    acetaminophen  650 MG suppository Commonly known as: TYLENOL  Place 1 suppository (650 mg total) rectally every 4 (four) hours as needed for fever (> 101.5).   ascorbic acid  500 MG tablet Commonly known as: VITAMIN C  Take 1 tablet (500 mg total) by mouth 2 (two) times daily.   calcium  carbonate 500 MG chewable tablet Commonly known as: TUMS - dosed in mg elemental calcium  Chew 2 tablets (400 mg of elemental calcium  total) by mouth 3 (three) times daily as needed for indigestion or heartburn.   carbidopa -levodopa  50-200 MG tablet Commonly known as: SINEMET  CR Take 1 tablet by mouth at bedtime.   carbidopa -levodopa  10-100 MG  tablet Commonly known as: SINEMET  IR Take 1 tablet by mouth 3 (three) times daily with meals.   colchicine  0.6 MG tablet Take 1 tablet (0.6 mg total) by mouth 2 (two) times daily as needed for up to 3 days (gout flare).   collagenase  250 UNIT/GM ointment Commonly known as: SANTYL  Apply topically daily.   cyclobenzaprine  10 MG tablet Commonly known as: FLEXERIL  Take 10 mg by mouth 3 (three) times daily.   DULoxetine  60 MG capsule Commonly known as: CYMBALTA  Take 1 capsule (60 mg total) by mouth daily.   furosemide  40 MG tablet Commonly known as: LASIX  Take 40 mg by mouth daily.   gabapentin  600 MG tablet Commonly known as: NEURONTIN  Take 600 mg by mouth 2 (two) times daily.   hydrOXYzine  25 MG tablet Commonly known as: ATARAX  Take 1 tablet (25 mg total) by mouth 2 (two) times daily as needed. Home med. What changed:  when to take this reasons to take this additional instructions   ipratropium-albuterol  0.5-2.5 (3) MG/3ML Soln  Commonly known as: DuoNeb Inhale 3 mLs into the lungs every 6 (six) hours as needed.   lidocaine  5 % Commonly known as: LIDODERM  Place 1 patch onto the skin daily. Remove & Discard patch within 12 hours or as directed by MD   lipase/protease/amylase 12000-38000 units Cpep capsule Commonly known as: CREON  Take 1 capsule (12,000 Units total) by mouth 3 (three) times daily before meals.   montelukast  10 MG tablet Commonly known as: SINGULAIR  Take 10 mg by mouth daily.   morphine  15 MG tablet Commonly known as: MSIR Take 15 mg by mouth 3 (three) times daily as needed.   multivitamin with minerals Tabs tablet Take 1 tablet by mouth daily.   omeprazole  40 MG capsule Commonly known as: PRILOSEC Take 1 capsule (40 mg total) by mouth daily.   oxyCODONE  5 MG immediate release tablet Commonly known as: Oxy IR/ROXICODONE  Take 1 tablet (5 mg total) by mouth every 6 (six) hours as needed for moderate pain (pain score 4-6) or severe pain (pain  score 7-10).   QUEtiapine  25 MG tablet Commonly known as: SEROQUEL  Take 1 tablet (25 mg total) by mouth at bedtime.   rizatriptan  10 MG disintegrating tablet Commonly known as: MAXALT -MLT Take 10 mg by mouth daily as needed for migraine.   rosuvastatin  5 MG tablet Commonly known as: CRESTOR  Take 1 tablet (5 mg total) by mouth at bedtime.   Symbicort  160-4.5 MCG/ACT inhaler Generic drug: budesonide -formoterol  INHALE 2 PUFFS TWICE A DAY RINSE MOUTH WITH WATER  AFTER EACH USE   theophylline  400 MG 24 hr tablet Commonly known as: UNIPHYL Take 400 mg by mouth daily.   tiotropium 18 MCG inhalation capsule Commonly known as: SPIRIVA  Place 1 capsule into inhaler and inhale daily. pm   Ventolin  HFA 108 (90 Base) MCG/ACT inhaler Generic drug: albuterol  INHALE 1 PUFF BY MOUTH AS NEEDED   Vitamin D  (Ergocalciferol ) 1.25 MG (50000 UNIT) Caps capsule Commonly known as: DRISDOL  Take 1 capsule (50,000 Units total) by mouth every 7 (seven) days.   zinc  sulfate (50mg  elemental zinc ) 220 (50 Zn) MG capsule Take 1 capsule (220 mg total) by mouth daily. Start taking on: January 27, 2024               Discharge Care Instructions  (From admission, onward)           Start     Ordered   01/26/24 0000  Discharge wound care:       Comments: Daily wound care.  Cleanse right and left lower leg wounds with Vashe, do not rinse.  Apply 1/4 thick layer of Santyl  to wound beds daily, top with saline moist gauze and dry gauze. Secure with silicone foam or Kerlix roll gauze anchored around foot which works best.   Cleanse B knee wounds with Vashe, apply xeroform gauze Soila [phone]) to wound beds daily and secure with silicone foam. - -   01/26/24 1035             Contact information for follow-up providers     Glenard Mire, MD Follow up.   Specialty: Family Medicine Why: hospital follow up Contact information: [address] [place]  [address] [phone]         Twylla Glendia BROCKS, MD Follow up in 1 week(s).   Specialty: Urology Why: For foley management. Contact information: [address] [place] [address] [phone]         Clois Fret, MD Follow up.  Specialty: Neurosurgery Why: for your right hand drop. Contact information: [address] [place] [address] [phone]              Contact information for after-discharge care     Destination     [facility] .   Service: Skilled Nursing Contact information: [address] [place]  [address] [phone]                     Allergies  Allergen Reactions   Augmentin  [Amoxicillin -Pot Clavulanate] Diarrhea   Penicillins Itching     The results of significant diagnostics from this hospitalization (including imaging, microbiology, ancillary and laboratory) are listed below for reference.   Consultations:   Procedures/Studies: MR CERVICAL SPINE W WO CONTRAST Result Date: 01/25/2024 EXAM: MRI CERVICAL SPINE WITH AND WITHOUT CONTRAST 01/25/2024 12:28:13 AM TECHNIQUE: Multiplanar multisequence MRI of the cervical spine was performed without and with the administration of intravenous contrast. COMPARISON: None available. CLINICAL HISTORY: Right hand drop. FINDINGS: Motion limited study.  Within this limitation: BONES AND ALIGNMENT: Normal alignment. Normal vertebral body heights. Marrow signal is unremarkable. No abnormal enhancement. SPINAL CORD: Normal spinal cord size. Normal spinal cord signal. No abnormal enhancement. SOFT TISSUES: No paraspinal mass. C2-C3: No significant disc herniation. Left greater than right facet and uncovertebral hypertrophy with mild left foraminal stenosis. Patent canal and right foramen. C3-C4: No significant disc herniation. Bilateral facet and uncovertebral hypertrophy with moderate to severe bilateral  foraminal stenosis. No significant canal stenosis. C4-C5: No significant disc herniation. Bilateral facet and uncovertebral hypertrophy with moderate left and mild right foraminal stenosis. No significant canal stenosis. C5-C6: Small posterior disc osteophyte complex. Bilateral facet and uncovertebral hypertrophy with moderate right and mild left foraminal stenosis. No significant canal stenosis. C6-C7: Small posterior disc osteophyte complex. Bilateral facet and uncovertebral hypertrophy with mild left foraminal stenosis. No significant canal stenosis. C7-T1: No significant disc herniation. No spinal canal stenosis or neural foraminal narrowing. IMPRESSION: 1. At C3-C4, moderate to severe bilateral foraminal stenosis. 2. Moderate foraminal stenosis on the left at C4-C5 and right at C5-C6. 3. Motion limited assessment. Electronically signed by: Gilmore Molt MD 01/25/2024 01:32 AM EST RP Workstation: [id number]   MR BRAIN W WO CONTRAST Result Date: 01/25/2024 EXAM: MRI BRAIN WITH AND WITHOUT CONTRAST 01/25/2024 12:28:13 AM TECHNIQUE: Multiplanar multisequence MRI of the head/brain was performed with and without the administration of intravenous contrast. COMPARISON: CT head 01/20/2024 CLINICAL HISTORY: Right hand drop FINDINGS: BRAIN AND VENTRICLES: No acute infarct. No acute intracranial hemorrhage. No mass effect or midline shift. No hydrocephalus. The sella is unremarkable. Normal flow voids. No mass or abnormal enhancement. ORBITS: No acute abnormality. SINUSES: No acute abnormality. BONES AND SOFT TISSUES: Normal bone marrow signal and enhancement. Left mastoidectomy. IMPRESSION: 1. No acute intracranial abnormality. 2. No mass or abnormal enhancement. Electronically signed by: Gilmore Molt MD 01/25/2024 01:18 AM EST RP Workstation: [id number]   MR BRAIN WO CONTRAST Result Date: 01/21/2024 EXAM: MRI BRAIN WITHOUT CONTRAST 01/21/2024 03:23:01 PM TECHNIQUE: Multiplanar multisequence MRI of the  head/brain was performed without the administration of intravenous contrast. COMPARISON: MR Head 10/20/2013. CLINICAL HISTORY: Neuro deficit, acute, stroke suspected. AMS with medical history significant of anxiety, osteoarthritis, asthma, COPD (chronic obstructive pulmonary disease), GERD (gastroesophageal reflux disease), dyslipidemia, and HTN (hypertension). FINDINGS: LIMITATIONS/ARTIFACTS: Motion degraded study. BRAIN AND VENTRICLES: No acute infarct. No intracranial hemorrhage. No mass. No midline shift. No hydrocephalus. The sella is unremarkable. Normal flow voids. ORBITS:  No acute abnormality. SINUSES AND MASTOIDS: No acute abnormality. BONES AND SOFT TISSUES: Normal marrow signal. No acute soft tissue abnormality. IMPRESSION: 1. No acute intracranial abnormality. 2. Motion degraded study. Electronically signed by: Franky Stanford MD 01/21/2024 10:08 PM EST RP Workstation: [id number]   US  RENAL Result Date: 01/21/2024 CLINICAL DATA:  Acute kidney injury. EXAM: RENAL / URINARY TRACT ULTRASOUND COMPLETE COMPARISON:  11/01/2023 FINDINGS: Right Kidney: Renal measurements: 10.4 x 4.9 x 4.4 cm = volume: 118 mL. New mild hydronephrosis. No focal lesions identified. Left Kidney: Renal measurements: 11.7 x 5.1 x 3.9 cm = volume: 121 mL. Mild hydronephrosis. Small shadowing calculi again noted. Bladder: Very distended bladder contains some dependent debris. Other: None. IMPRESSION: 1. New mild bilateral hydronephrosis. 2. Very distended bladder contains some dependent debris. Cause of mild bilateral hydronephrosis may be secondary to bladder distension. Consider Foley bladder decompression. 3. Small left renal calculi again noted. Electronically Signed   By: Marcey Moan M.D.   On: 01/21/2024 17:30   ECHOCARDIOGRAM COMPLETE Result Date: 01/21/2024    ECHOCARDIOGRAM REPORT   Patient Name:   Tricia [name] Date of Exam: 01/21/2024 Medical Rec #:  [id number]              Height:       68.0 in Accession #:     [id number]             Weight:       132.3 lb Date of Birth:  [birth year]              BSA:          1.714 m Patient Age:    63 years               BP:           123/60 mmHg Patient Gender: F                      HR:           87 bpm. Exam Location:  [facility] Procedure: 2D Echo, Cardiac Doppler, Color Doppler, 3D Echo and Strain Analysis            (Both Spectral and Color Flow Doppler were utilized during            procedure). Indications:     Elevated troponin  History:         Patient has prior history of Echocardiogram examinations, most                  recent 08/30/2023. COPD; Risk Factors:Hypertension and Diabetes.  Sonographer:     Christopher Furnace Referring Phys:  [id number] DAVED BROCKS [facility] Diagnosing Phys: Lonni Hanson MD  Sonographer Comments: Global longitudinal strain was attempted. IMPRESSIONS  1. Left ventricular ejection fraction, by estimation, is 55 to 60%. Left ventricular ejection fraction by 3D volume is 52 %. The left ventricle has normal function. The left ventricle has no regional wall motion abnormalities. The left ventricular internal cavity size was mildly dilated. Left ventricular diastolic parameters were normal. The average left ventricular global longitudinal strain is -10.9 %. The global longitudinal strain is abnormal.  2. Right ventricular systolic function is normal. The right ventricular size is mildly enlarged. Mildly increased right ventricular wall thickness. There is severely elevated pulmonary artery systolic pressure.  3. The mitral valve is degenerative. Moderate mitral valve regurgitation. Mild mitral stenosis; mitral valve gradient may be accentuated by increased flow related to mitral  regurgitation.  4. The aortic valve was not well visualized. Aortic valve regurgitation is not visualized. Mild aortic valve stenosis. Aortic valve area, by VTI measures 1.87 cm. Aortic valve mean gradient measures 11.0 mmHg.  5. Mild pulmonic stenosis.  6. The inferior vena cava is normal in size  with <50% respiratory variability, suggesting right atrial pressure of 8 mmHg. FINDINGS  Left Ventricle: Left ventricular ejection fraction, by estimation, is 55 to 60%. Left ventricular ejection fraction by 3D volume is 52 %. The left ventricle has normal function. The left ventricle has no regional wall motion abnormalities. The average left ventricular global longitudinal strain is -10.9 %. Strain was performed and the global longitudinal strain is abnormal. The left ventricular internal cavity size was mildly dilated. There is no left ventricular hypertrophy. Left ventricular diastolic parameters were normal. Right Ventricle: The right ventricular size is mildly enlarged. Mildly increased right ventricular wall thickness. Right ventricular systolic function is normal. There is severely elevated pulmonary artery systolic pressure. The tricuspid regurgitant velocity is 3.80 m/s, and with an assumed right atrial pressure of 8 mmHg, the estimated right ventricular systolic pressure is 65.8 mmHg. Left Atrium: Left atrial size was normal in size. Right Atrium: Right atrial size was normal in size. Pericardium: There is no evidence of pericardial effusion. Mitral Valve: The mitral valve is degenerative in appearance. There is mild thickening of the mitral valve leaflet(s). Moderate mitral valve regurgitation. Mild mitral valve stenosis. MV peak gradient, 11.3 mmHg. The mean mitral valve gradient is 6.0 mmHg. Tricuspid Valve: The tricuspid valve is normal in structure. Tricuspid valve regurgitation is mild. Aortic Valve: The aortic valve was not well visualized. Aortic valve regurgitation is not visualized. Mild aortic stenosis is present. Aortic valve mean gradient measures 11.0 mmHg. Aortic valve peak gradient measures 18.0 mmHg. Aortic valve area, by VTI  measures 1.87 cm. Pulmonic Valve: The pulmonic valve was not well visualized. Pulmonic valve regurgitation is not visualized. Mild pulmonic stenosis. Aorta: The  aortic root is normal in size and structure. Pulmonary Artery: The pulmonary artery is of normal size. Venous: The inferior vena cava is normal in size with less than 50% respiratory variability, suggesting right atrial pressure of 8 mmHg. IAS/Shunts: The interatrial septum was not well visualized. Additional Comments: 3D was performed not requiring image post processing on an independent workstation and was normal.  LEFT VENTRICLE PLAX 2D LVIDd:         5.30 cm         Diastology LVIDs:         3.50 cm         LV e' medial:    10.40 cm/s LV PW:         0.80 cm         LV E/e' medial:  13.7 LV IVS:        1.00 cm         LV e' lateral:   13.90 cm/s LVOT diam:     2.00 cm         LV E/e' lateral: 10.2 LV SV:         73 LV SV Index:   43              2D Longitudinal LVOT Area:     3.14 cm        Strain LV IVRT:       82 msec         2D Strain GLS   -10.9 %  Avg:                                 3D Volume EF                                LV 3D EF:    Left                                             ventricul                                             ar                                             ejection                                             fraction                                             by 3D                                             volume is                                             52 %.                                 3D Volume EF:                                3D EF:        52 % RIGHT VENTRICLE RV Basal diam:  4.55 cm     PULMONARY VEINS RV S prime:     11.30 cm/s  Diastolic Velocity: 34.90 cm/s TAPSE (M-mode): 2.1 cm      S/D Velocity:       1.90                             Systolic Velocity:  66.20 cm/s LEFT ATRIUM           Index        RIGHT ATRIUM           Index LA diam:      4.70 cm 2.74 cm/m   RA Area:  11.30 cm LA Vol (A2C): 24.7 ml 14.41 ml/m  RA Volume:   23.40 ml  13.65 ml/m LA Vol (A4C): 54.1 ml 31.56 ml/m  AORTIC VALVE                      PULMONIC VALVE AV Area (Vmax):    1.47 cm      PV Vmax:       2.23 m/s AV Area (Vmean):   1.42 cm      PV Vmean:      152.726 cm/s AV Area (VTI):     1.87 cm      PV VTI:        0.411 m AV Vmax:           212.00 cm/s   PV Peak grad:  19.9 mmHg AV Vmean:          150.667 cm/s  PV Mean grad:  10.5 mmHg AV VTI:            0.392 m AV Peak Grad:      18.0 mmHg AV Mean Grad:      11.0 mmHg LVOT Vmax:         99.00 cm/s LVOT Vmean:        68.100 cm/s LVOT VTI:          0.233 m LVOT/AV VTI ratio: 0.59  AORTA Ao Root diam: 2.50 cm MITRAL VALVE                  TRICUSPID VALVE MV Area (PHT): 4.49 cm       TR Peak grad:   57.8 mmHg MV Area VTI:   2.11 cm       TR Vmax:        380.00 cm/s MV Peak grad:  11.3 mmHg MV Mean grad:  6.0 mmHg       SHUNTS MV Vmax:       1.68 m/s       Systemic VTI:  0.23 m MV Vmean:      119.0 cm/s     Systemic Diam: 2.00 cm MV Decel Time: 169 msec MR Peak grad:    115.3 mmHg MR Mean grad:    80.0 mmHg MR Vmax:         537.00 cm/s MR Vmean:        423.0 cm/s MR PISA:         1.57 cm MR PISA Eff ROA: 9 mm MR PISA Radius:  0.50 cm MV E velocity: 142.00 cm/s MV A velocity: 141.00 cm/s MV E/A ratio:  1.01 Lonni End MD Electronically signed by Lonni Hanson MD Signature Date/Time: 01/21/2024/2:10:34 PM    Final    DG Tibia/Fibula Right Result Date: 01/20/2024 EXAM: _VIEWS_ VIEW(S) XRAY OF THE RIGHT TIBIA AND FIBULA 01/20/2024 08:14:00 PM COMPARISON: None available. CLINICAL HISTORY: [id number] Cellulitis [id number] FINDINGS: BONES AND JOINTS: No acute fracture. No focal osseous lesion. No joint dislocation. SOFT TISSUES: Diffuse soft tissue edema. IMPRESSION: 1. Diffuse soft tissue edema, compatible with cellulitis in the appropriate context. Electronically signed by: Franky Stanford MD 01/20/2024 08:56 PM EST RP Workstation: [id number]   CT Head Wo Contrast Result Date: 01/20/2024 EXAM: CT HEAD WITHOUT 01/20/2024 06:41:45 PM TECHNIQUE: CT of the head was performed without the administration of  intravenous contrast. Automated exposure control, iterative reconstruction, and/or weight based adjustment of the mA/kV was utilized to reduce the radiation dose to as low as reasonably achievable. COMPARISON: 08/28/2023 CLINICAL HISTORY:  Mental status change, unknown cause. FINDINGS: BRAIN AND VENTRICLES: No acute intracranial hemorrhage. No mass effect or midline shift. No extra-axial fluid collection. No evidence of acute infarct. No hydrocephalus. ORBITS: No acute abnormality. SINUSES AND MASTOIDS: Left mastoidectomy. Trace fluid in right mastoid air cells. SOFT TISSUES AND SKULL: No acute skull fracture. No acute soft tissue abnormality. IMPRESSION: 1. No acute intracranial abnormality. 2. Left mastoidectomy and trace fluid in right mastoid air cells. Electronically signed by: Franky Stanford MD 01/20/2024 07:32 PM EST RP Workstation: [id number]   DG Chest Portable 1 View Result Date: 01/20/2024 CLINICAL DATA:  COPD exacerbation. EXAM: PORTABLE CHEST 1 VIEW COMPARISON:  Radiographs 11/13/2023 and 11/12/2023.  CTA 11/10/2023. FINDINGS: 1413 hours. Two views are submitted. The heart size and mediastinal contours are stable. Previously demonstrated small right pleural effusion has resolved. There is improved aeration of both lung bases with mild residual atelectasis or scarring on the right. No confluent airspace disease or pneumothorax. No acute osseous findings are evident. There are mild degenerative changes in the spine. Telemetry leads overlie the chest. IMPRESSION: Interval resolution of right pleural effusion with improved aeration of both lung bases. No acute cardiopulmonary process. Electronically Signed   By: Elsie Perone M.D.   On: 01/20/2024 15:09      Labs: BNP (last 3 results) Recent Labs    10/30/23 1928 11/07/23 0452 11/10/23 2025  BNP 78.5 119.1* 167.9*   Basic Metabolic Panel: Recent Labs  Lab 01/20/24 1356 01/20/24 2059 01/21/24 0425 01/22/24 0915 01/24/24 0633  01/25/24 0637  NA 134*  --  137 132* 136 136  K 3.9  --  4.0 3.5 3.2* 4.2  CL 103  --  105 102 102 106  CO2 19*  --  20* 21* 24 21*  GLUCOSE 220*  --  146* 143* 106* 118*  BUN 28*  --  31* 22 17 12   CREATININE 2.41* 2.30* 2.16* 1.55* 1.31* 1.13*  CALCIUM  8.0*  --  7.9* 7.5* 7.4* 7.5*  MG 2.1  --   --   --   --  1.9  PHOS  --   --   --  2.1*  --  3.3   Liver Function Tests: Recent Labs  Lab 01/20/24 1356 01/22/24 0915  AST 60*  --   ALT 16  --   ALKPHOS 126  --   BILITOT 0.4  --   PROT 6.9  --   ALBUMIN  3.1* 2.6*   No results for input(s): LIPASE, AMYLASE in the last 168 hours. No results for input(s): AMMONIA in the last 168 hours. CBC: Recent Labs  Lab 01/20/24 1356 01/20/24 2059 01/21/24 0425 01/24/24 0633  WBC 5.8 3.8* 4.3 7.4  NEUTROABS 3.9  --   --   --   HGB 8.4* 8.0* 7.5* 8.9*  HCT 26.5* 25.4* 23.5* 27.0*  MCV 96.7 96.9 96.7 92.8  PLT 296 262 249 213   Cardiac Enzymes: No results for input(s): CKTOTAL, CKMB, CKMBINDEX, TROPONINI in the last 168 hours. BNP: Invalid input(s): POCBNP CBG: No results for input(s): GLUCAP in the last 168 hours. D-Dimer No results for input(s): DDIMER in the last 72 hours. Hgb A1c No results for input(s): HGBA1C in the last 72 hours. Lipid Profile No results for input(s): CHOL, HDL, LDLCALC, TRIG, CHOLHDL, LDLDIRECT in the last 72 hours. Thyroid  function studies No results for input(s): TSH, T4TOTAL, T3FREE, THYROIDAB in the last 72 hours.  Invalid input(s): FREET3 Anemia work up No results for input(s): VITAMINB12, FOLATE, FERRITIN,  TIBC, IRON, RETICCTPCT in the last 72 hours. Urinalysis    Component Value Date/Time   COLORURINE YELLOW (A) 01/21/2024 0830   APPEARANCEUR TURBID (A) 01/21/2024 0830   APPEARANCEUR Cloudy (A) 01/24/2015 0000   LABSPEC 1.009 01/21/2024 0830   LABSPEC 1.005 11/06/2012 2335   PHURINE 5.0 01/21/2024 0830   GLUCOSEU >=500 (A)  01/21/2024 0830   GLUCOSEU Negative 11/06/2012 2335   HGBUR SMALL (A) 01/21/2024 0830   BILIRUBINUR NEGATIVE 01/21/2024 0830   BILIRUBINUR Small 10/25/2021 1326   BILIRUBINUR Negative 01/24/2015 0000   BILIRUBINUR Negative 11/06/2012 2335   KETONESUR NEGATIVE 01/21/2024 0830   PROTEINUR 100 (A) 01/21/2024 0830   UROBILINOGEN 0.2 10/25/2021 1326   NITRITE NEGATIVE 01/21/2024 0830   LEUKOCYTESUR LARGE (A) 01/21/2024 0830   LEUKOCYTESUR 3+ 11/06/2012 2335   Sepsis Labs Recent Labs  Lab 01/20/24 1356 01/20/24 2059 01/21/24 0425 01/24/24 0633  WBC 5.8 3.8* 4.3 7.4   Microbiology Recent Results (from the past 240 hours)  Blood culture (routine x 2)     Status: None   Collection Time: 01/20/24  2:07 PM   Specimen: BLOOD  Result Value Ref Range Status   Specimen Description BLOOD BLOOD RIGHT ARM  Final   Special Requests   Final    BOTTLES DRAWN AEROBIC AND ANAEROBIC Blood Culture results may not be optimal due to an inadequate volume of blood received in culture bottles   Culture   Final    NO GROWTH 5 DAYS Performed at [facility], [address] [place], [place] [address]    Report Status 01/25/2024 FINAL  Final  Blood culture (routine x 2)     Status: None   Collection Time: 01/20/24  2:12 PM   Specimen: BLOOD  Result Value Ref Range Status   Specimen Description BLOOD BLOOD LEFT ARM  Final   Special Requests   Final    BOTTLES DRAWN AEROBIC AND ANAEROBIC Blood Culture results may not be optimal due to an inadequate volume of blood received in culture bottles   Culture   Final    NO GROWTH 5 DAYS Performed at [facility], [address] [place], [place] [address]    Report Status 01/25/2024 FINAL  Final  Resp panel by RT-PCR (RSV, Flu A&B, Covid) Anterior Nasal Swab     Status: None   Collection Time: 01/20/24  2:55 PM   Specimen: Anterior Nasal Swab  Result Value Ref Range Status   SARS Coronavirus 2 by RT PCR NEGATIVE NEGATIVE Final     Comment: (NOTE) SARS-CoV-2 target nucleic acids are NOT DETECTED.  The SARS-CoV-2 RNA is generally detectable in upper respiratory specimens during the acute phase of infection. The lowest concentration of SARS-CoV-2 viral copies this assay can detect is 138 copies/mL. A negative result does not preclude SARS-Cov-2 infection and should not be used as the sole basis for treatment or other patient management decisions. A negative result may occur with  improper specimen collection/handling, submission of specimen other than nasopharyngeal swab, presence of viral mutation(s) within the areas targeted by this assay, and inadequate number of viral copies(<138 copies/mL). A negative result must be combined with clinical observations, patient history, and epidemiological information. The expected result is Negative.  Fact Sheet for Patients:  bloggercourse.com  Fact Sheet for Healthcare Providers:  seriousbroker.it  This test is no t yet approved or cleared by the [place]  FDA and  has been authorized for detection and/or diagnosis of SARS-CoV-2 by FDA under an Emergency  Use Authorization (EUA). This EUA will remain  in effect (meaning this test can be used) for the duration of the COVID-19 declaration under Section 564(b)(1) of the Act, 21 U.S.C.section 360bbb-3(b)(1), unless the authorization is terminated  or revoked sooner.       Influenza A by PCR NEGATIVE NEGATIVE Final   Influenza B by PCR NEGATIVE NEGATIVE Final    Comment: (NOTE) The Xpert Xpress SARS-CoV-2/FLU/RSV plus assay is intended as an aid in the diagnosis of influenza from Nasopharyngeal swab specimens and should not be used as a sole basis for treatment. Nasal washings and aspirates are unacceptable for Xpert Xpress SARS-CoV-2/FLU/RSV testing.  Fact Sheet for Patients: bloggercourse.com  Fact Sheet for Healthcare  Providers: seriousbroker.it  This test is not yet approved or cleared by the [place]  FDA and has been authorized for detection and/or diagnosis of SARS-CoV-2 by FDA under an Emergency Use Authorization (EUA). This EUA will remain in effect (meaning this test can be used) for the duration of the COVID-19 declaration under Section 564(b)(1) of the Act, 21 U.S.C. section 360bbb-3(b)(1), unless the authorization is terminated or revoked.     Resp Syncytial Virus by PCR NEGATIVE NEGATIVE Final    Comment: (NOTE) Fact Sheet for Patients: bloggercourse.com  Fact Sheet for Healthcare Providers: seriousbroker.it  This test is not yet approved or cleared by the [place]  FDA and has been authorized for detection and/or diagnosis of SARS-CoV-2 by FDA under an Emergency Use Authorization (EUA). This EUA will remain in effect (meaning this test can be used) for the duration of the COVID-19 declaration under Section 564(b)(1) of the Act, 21 U.S.C. section 360bbb-3(b)(1), unless the authorization is terminated or revoked.  Performed at [facility], [address], [place], [place] [address]   Urine Culture (for pregnant, neutropenic or urologic patients or patients with an indwelling urinary catheter)     Status: Abnormal   Collection Time: 01/21/24  8:30 AM   Specimen: Urine, Clean Catch  Result Value Ref Range Status   Specimen Description   Final    URINE, CLEAN CATCH Performed at [facility], [address], [place], [place] [address]    Special Requests   Final    NONE Performed at [facility], [address] [place], [place] [address]    Culture MULTIPLE SPECIES PRESENT, SUGGEST RECOLLECTION (A)  Final   Report Status 01/22/2024 FINAL  Final  MRSA Next Gen by PCR, Nasal     Status: Abnormal   Collection Time: 01/22/24 11:13 AM   Specimen: Nasal Mucosa; Nasal Swab   Result Value Ref Range Status   MRSA by PCR Next Gen DETECTED (A) NOT DETECTED Final    Comment: RESULT CALLED TO, READ BACK BY AND VERIFIED WITH: KRISTIN MORRISON 01/22/24 1255 MW (NOTE) The GeneXpert MRSA Assay (FDA approved for NASAL specimens only), is one component of a comprehensive MRSA colonization surveillance program. It is not intended to diagnose MRSA infection nor to guide or monitor treatment for MRSA infections. Test performance is not FDA approved in patients less than 2 years old. Performed at [facility], [address] [place], [place] [address]      Total time spend on discharging this patient, including the last patient exam, discussing the hospital stay, instructions for ongoing care as it relates to all pertinent caregivers, as well as preparing the medical discharge records, prescriptions, and/or referrals as applicable, is 35 minutes.    Ellouise Haber, MD  [facility] 01/26/2024, 10:41  AM    "

## 2024-02-05 ENCOUNTER — Telehealth: Payer: Self-pay

## 2024-02-05 NOTE — Telephone Encounter (Signed)
 Any concerns:  Patient is developing think green mucous. Patient is having pink bowls.

## 2024-02-05 NOTE — Telephone Encounter (Signed)
 Called Tricia [name] gave VO, I advised Dr.Sowles message. Called pt informed her about it and pt declined to seek medical attention due to it only happened yesterday after eating Cheese puffs.

## 2024-02-05 NOTE — Telephone Encounter (Signed)
 Copied from CRM #[id number]. Topic: Clinical - Home Health Verbal Orders >> Feb 05, 2024  8:10 AM Edsel HERO wrote: Caller/Agency: [facility] Callback Number: [phone] Service Requested: Skilled Nursing Frequency: 2w4 and the 1x every other week until 04/15/24 Any new concerns about the patient? Yes Patient is developing think green mucous. Patient is having pink bowls.

## 2024-02-07 NOTE — Progress Notes (Signed)
 "  Referring Physician:  Awanda City, MD [address] [place],  [place] [address]  Primary Physician:  Glenard Mire, MD  History of Present Illness: 02/17/2024 [name] is a 63 y.o with a history of CKD, COPD on 2-3L of O2 PRN, Parkinson's, DM, and recent hospitalization for sepsis due to cellulitis as a result of a chronic wound, and altered mental status who is here today for follow-up of acute neck pain with left shoulder and right hand pain and wrist drop.  She states that this has been going on for years referred to us  about the recommendation of inpatient.  Lying flat.  She is currently daily smoker she is working with PT and OT home health.  She does state that she had a epidural steroid injection years ago which did provide a couple days of relief but has not undergone additional injections in the last year.  Today she reports ongoing left shoulder pain that is worse with overhead movement, neck pain, and circumferential right hand pain with progressive right hand weakness over the last couple of weeks. She denies any pain that radiates down the length of her right arm.  She states the pain in her right hand is largely chronic and has been present since her carpal tunnel release in the 90s. She was also having quite a bit of trouble with her Foley catheter that was placed while she was inpatient.  She is having multiple soaping episodes requiring her to change all of her close.  She is requesting that we remove her Foley catheter today.  She has outpatient follow-up with urology tomorrow.  Conservative measures:  Physical therapy: is currently doing HH PT and OT Multimodal medical therapy including regular antiinflammatories:  Tylenol , Flexeril , Cymbalta , Gabapentin , Lidocaine  patch, Oxycodone , Morphine  Injections: no epidural steroid injections  [name] has no symptoms of cervical myelopathy.  The symptoms are causing a significant impact on the  patient's life.   Review of Systems:  A 10 point review of systems is negative, except for the pertinent positives and negatives detailed in the HPI.  Past Medical History: Past Medical History:  Diagnosis Date   Anxiety    Arthritis    joints and hands/ knees   Asthma    uses inhaler   Benign essential tremor    head   Cervical dystonia    neck pain   Cholesteatoma of left ear    x2   COPD (chronic obstructive pulmonary disease) (HCC)    Cough    Depression    Diabetes mellitus without complication (HCC)    type 2   Diastolic dysfunction    Dyspnea    Dysrhythmia    diastolic dysfunction   GERD (gastroesophageal reflux disease)    Headache    migraines/ one per week   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   Hyperlipidemia    Hypertension    Motion sickness    boat   Neuromuscular disorder (HCC)    neuropathy feet and hands( nerve damage)   Wears dentures    upper and lower    Past Surgical History: Past Surgical History:  Procedure Laterality Date   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   Procedure: AMPUTATION, TOE;  Surgeon: Lennie Barter, DPM;  Location: [facility];  Service: Orthopedics/Podiatry;  Laterality: Left;  2nd Toe amputation   CARPAL TUNNEL RELEASE Bilateral    x2 right, 1x on left   COLONOSCOPY  COLONOSCOPY WITH PROPOFOL  N/A 04/25/2017   Procedure: COLONOSCOPY WITH PROPOFOL ;  Surgeon: Jinny Carmine, MD;  Location: [facility];  Service: Endoscopy;  Laterality: N/A;  diabetic-oral med   DILATION AND CURETTAGE OF UTERUS     ESOPHAGOGASTRODUODENOSCOPY (EGD) WITH PROPOFOL  N/A 01/10/2021   Procedure: ESOPHAGOGASTRODUODENOSCOPY (EGD) WITH PROPOFOL ;  Surgeon: Janalyn Keene NOVAK, MD;  Location: [facility];  Service: Endoscopy;  Laterality: N/A;  Diabetic   EXTERNAL EAR SURGERY Left    x2   IRRIGATION AND DEBRIDEMENT FOOT Right 11/14/2023   Procedure: IRRIGATION AND DEBRIDEMENT FOOT;  Surgeon: Lennie Barter, DPM;  Location: [facility];   Service: Orthopedics/Podiatry;  Laterality: Right;   POLYPECTOMY  04/25/2017   Procedure: POLYPECTOMY INTESTINAL;  Surgeon: Jinny Carmine, MD;  Location: [facility];  Service: Endoscopy;;   SPINE SURGERY     herniated disc   TUBAL LIGATION     WOUND DEBRIDEMENT Bilateral 11/14/2023   Procedure: DEBRIDEMENT, WOUND;  Surgeon: Lennie Barter, DPM;  Location: [facility];  Service: Orthopedics/Podiatry;  Laterality: Bilateral;    Allergies: Allergies as of 02/17/2024 - Review Complete 02/17/2024  Allergen Reaction Noted   Augmentin  [amoxicillin -pot clavulanate] Diarrhea 11/12/2012   Penicillins Itching 08/16/2014    Medications: Outpatient Encounter Medications as of 02/17/2024  Medication Sig   acetaminophen  (TYLENOL ) 650 MG suppository Place 1 suppository (650 mg total) rectally every 4 (four) hours as needed for fever (> 101.5).   ascorbic acid  (VITAMIN C ) 500 MG tablet Take 1 tablet (500 mg total) by mouth 2 (two) times daily.   calcium  carbonate (TUMS - DOSED IN MG ELEMENTAL CALCIUM ) 500 MG chewable tablet Chew 2 tablets (400 mg of elemental calcium  total) by mouth 3 (three) times daily as needed for indigestion or heartburn.   carbidopa -levodopa  (SINEMET  CR) 50-200 MG tablet Take 1 tablet by mouth at bedtime.   carbidopa -levodopa  (SINEMET  IR) 10-100 MG tablet Take 1 tablet by mouth 3 (three) times daily with meals.   colchicine  0.6 MG tablet Take 1 tablet (0.6 mg total) by mouth 2 (two) times daily as needed for up to 3 days (gout flare).   collagenase  (SANTYL ) 250 UNIT/GM ointment Apply topically daily.   cyclobenzaprine  (FLEXERIL ) 10 MG tablet Take 10 mg by mouth 3 (three) times daily.   DULoxetine  (CYMBALTA ) 60 MG capsule Take 1 capsule (60 mg total) by mouth daily.   furosemide  (LASIX ) 40 MG tablet Take 40 mg by mouth daily.   gabapentin  (NEURONTIN ) 600 MG tablet Take 600 mg by mouth 2 (two) times daily.   hydrOXYzine  (ATARAX ) 25 MG tablet Take 1 tablet (25 mg total) by mouth 2  (two) times daily as needed. Home med.   ipratropium-albuterol  (DUONEB) 0.5-2.5 (3) MG/3ML SOLN Inhale 3 mLs into the lungs every 6 (six) hours as needed.   lidocaine  (LIDODERM ) 5 % Place 1 patch onto the skin daily. Remove & Discard patch within 12 hours or as directed by MD   lipase/protease/amylase (CREON ) 12000-38000 units CPEP capsule Take 1 capsule (12,000 Units total) by mouth 3 (three) times daily before meals.   montelukast  (SINGULAIR ) 10 MG tablet Take 10 mg by mouth daily.   morphine  (MSIR) 15 MG tablet Take 15 mg by mouth 3 (three) times daily as needed.   Multiple Vitamin (MULTIVITAMIN WITH MINERALS) TABS tablet Take 1 tablet by mouth daily.   omeprazole  (PRILOSEC) 40 MG capsule Take 1 capsule (40 mg total) by mouth daily.   predniSONE  (DELTASONE ) 10 MG tablet Take 10 mg by mouth daily.  QUEtiapine  (SEROQUEL ) 25 MG tablet Take 1 tablet (25 mg total) by mouth at bedtime.   rizatriptan  (MAXALT -MLT) 10 MG disintegrating tablet Take 10 mg by mouth daily as needed for migraine.   rosuvastatin  (CRESTOR ) 5 MG tablet Take 1 tablet (5 mg total) by mouth at bedtime.   SYMBICORT  160-4.5 MCG/ACT inhaler INHALE 2 PUFFS TWICE A DAY RINSE MOUTH WITH WATER  AFTER EACH USE   theophylline  (UNIPHYL) 400 MG 24 hr tablet Take 400 mg by mouth daily.   tiotropium (SPIRIVA ) 18 MCG inhalation capsule Place 1 capsule into inhaler and inhale daily. pm   VENTOLIN  HFA 108 (90 Base) MCG/ACT inhaler INHALE 1 PUFF BY MOUTH AS NEEDED   Vitamin D , Ergocalciferol , (DRISDOL ) 1.25 MG (50000 UNIT) CAPS capsule Take 1 capsule (50,000 Units total) by mouth every 7 (seven) days.   zinc  sulfate, 50mg  elemental zinc , 220 (50 Zn) MG capsule Take 1 capsule (220 mg total) by mouth daily.   [DISCONTINUED] oxyCODONE  (OXY IR/ROXICODONE ) 5 MG immediate release tablet Take 1 tablet (5 mg total) by mouth every 6 (six) hours as needed for moderate pain (pain score 4-6) or severe pain (pain score 7-10).   No facility-administered  encounter medications on file as of 02/17/2024.    Social History: Social History[1]  Family Medical History: Family History  Problem Relation Age of Onset   Emphysema Mother    Anxiety disorder Mother    Stroke Father    Throat cancer Father    Lung cancer Maternal Grandmother    Lung cancer Maternal Grandfather    Hypertension Daughter    Diabetes Daughter    Multiple sclerosis Daughter    Bipolar disorder Daughter    Cervical cancer Daughter    Bipolar disorder Daughter    Drug abuse Daughter    Lung cancer Maternal Aunt    Lung cancer Maternal Uncle     Physical Examination: Today's Vitals   02/17/24 1402  BP: 120/76  Weight: 137 lb (62.1 kg)  PainSc: 8   PainLoc: Hand   Body mass index is 20.83 kg/m.   General: Patient is well developed, well nourished, calm, collected, and in no apparent distress. Attention to examination is appropriate.  Psychiatric: Patient is non-anxious.  Head:  Pupils equal, round, and reactive to light.  ENT:  Oral mucosa appears well hydrated.  Neck:   Supple.   Respiratory: Patient is breathing without any difficulty.  Extremities: No edema.  Vascular: Palpable dorsal pedal pulses.  Skin:   On exposed skin, there are no abnormal skin lesions.  NEUROLOGICAL:     Awake, alert, oriented to person, place, and time.  Speech is clear and fluent. Fund of knowledge is appropriate.   Cranial Nerves: Pupils equal round and reactive to light.  Facial tone is symmetric.  Facial sensation is symmetric.  ROM of spine: limited  Palpation of spine: TTP throughout   Strength: Side Biceps Triceps Deltoid Interossei Grip Wrist Ext. Wrist Flex.  R 5 5 5 5 3 4 1   L 5 5 5 5 5 5 5    Side Iliopsoas Quads Hamstring PF DF EHL  R 5 5 5 5 5 5   L 5 5 5 5 5 5    Reflexes are 1+ and symmetric at the biceps, triceps, left brachioradialis, patella and achilles.   Hoffman's is absent. Absent right brachoradialis reflex Clonus is not present.  Toes  are down-going.  Bilateral upper and lower extremity sensation is intact to light touch.    Gait is  normal.   Medical Decision Making  Imaging: MRI C spine 01/24/24  IMPRESSION: 1. At C3-C4, moderate to severe bilateral foraminal stenosis. 2. Moderate foraminal stenosis on the left at C4-C5 and right at C5-C6. 3. Motion limited assessment.   Electronically signed by: Gilmore Molt MD 01/25/2024 01:32 AM EST RP Workstation: [id number]  I have personally reviewed the images and agree with the above interpretation.  Assessment and Plan: Ms. Korzeniewski is a  63 y.o. female with a longstanding history of neck pain.  She has had progressive right hand weakness but does not have any right sided radicular symptoms.  Cannot localize her and pain to a dermatomal distribution.  She has pretty significant weakness on extension.  I recommended further evaluation with an EMG as I do not feel her MRI findings fully explain her symptoms.  I will follow up with her via MyChart with the results of her EMG and further plan of care.  A wrist splint was recommended in the hospital but was never received by the patient.  I have physical therapy and Occupational Therapy team who recommended outpatient follow-up with [place] [facility] to reevaluate her and make recommendations regarding brace.  I have placed a referral for this. She also having neck pain.  The pain in her left shoulder is reproducible with deep palpation over the biceps insertion. This does not appear to be consistent with cervical radiculopathy either. I placed a referral to ortho for further evaluation of this. I also recommended considering injections for her neck pain. She is currently a patient in [place] for medication management. Her last refill of Morphine  was filled by [facility]. She would ideally have injections for her neck done here as long as this is not going to violate her pain contract.  I will reach out to [facility] for  further evaluation. In regards to her urinary symptoms, I explained that we do not have trial of void today. I did contact Sheppard Hones with urology and she helped arrange having the patient come over to their office today for a bag exchange   Thank you for involving me in the care of this patient.   I spent a total of 50 minutes in both face-to-face and non-face-to-face activities for this visit on the date of this encounter review of symptoms, review of imaging, discussion of differential diagnosis, coronation of care with multiple non-neurosurgical providers, order placement, and documentation.   [facility] Dept. of Neurosurgery      [1]  Social History Tobacco Use   Smoking status: Every Day    Current packs/day: 1.00    Average packs/day: 1 pack/day for 46.7 years (46.7 ttl pk-yrs)    Types: Cigarettes    Start date: 05/19/1977   Smokeless tobacco: Never  Vaping Use   Vaping status: Former  Substance Use Topics   Alcohol use: No    Alcohol/week: 0.0 standard drinks of alcohol   Drug use: No   "

## 2024-02-11 ENCOUNTER — Ambulatory Visit: Admitting: Family Medicine

## 2024-02-17 ENCOUNTER — Ambulatory Visit: Admitting: Neurosurgery

## 2024-02-17 ENCOUNTER — Encounter: Payer: Self-pay | Admitting: Neurosurgery

## 2024-02-17 VITALS — BP 120/76 | Wt 137.0 lb

## 2024-02-17 DIAGNOSIS — M25512 Pain in left shoulder: Secondary | ICD-10-CM | POA: Diagnosis not present

## 2024-02-17 DIAGNOSIS — M21331 Wrist drop, right wrist: Secondary | ICD-10-CM | POA: Diagnosis not present

## 2024-02-17 DIAGNOSIS — M542 Cervicalgia: Secondary | ICD-10-CM | POA: Diagnosis not present

## 2024-02-17 DIAGNOSIS — G8929 Other chronic pain: Secondary | ICD-10-CM | POA: Diagnosis not present

## 2024-02-18 ENCOUNTER — Ambulatory Visit (INDEPENDENT_AMBULATORY_CARE_PROVIDER_SITE_OTHER): Admitting: Physician Assistant

## 2024-02-18 ENCOUNTER — Ambulatory Visit: Admitting: Physician Assistant

## 2024-02-18 VITALS — BP 125/81 | HR 94

## 2024-02-18 DIAGNOSIS — R339 Retention of urine, unspecified: Secondary | ICD-10-CM | POA: Diagnosis not present

## 2024-02-18 LAB — BLADDER SCAN AMB NON-IMAGING: Scan Result: 405

## 2024-02-18 NOTE — Patient Instructions (Signed)
 Sacral wound dressing

## 2024-02-18 NOTE — Progress Notes (Signed)
 Pt here today to have foley catheter removed for AM V/T. 10 ml of water  removed from foley and 16 Fr catheter removed. Pt tolerated well. Pt instructed to make sure to drink fluids throughout the day and to try and use the restroom normally. Pt will return this afternoon for PM V/T.   Mathew Pinal, RN

## 2024-02-18 NOTE — Progress Notes (Signed)
 "  02/18/2024 5:14 PM   Tricia [name] 23-Apr-1960 [id number]  CC: Chief Complaint  Patient presents with   Urinary Retention   HPI: Tricia [name] is a 63 y.o. female with PMH diabetes, CKD, COPD, Parkinson's, cervical stenosis, and recurrent urinary retention most recently in the setting of hospitalization with sepsis due to chronic wound cellulitis who presents today for voiding trial.   Foley removed in the morning; see separate note.  She returned in the afternoon. She reports she has been unable to void, though her bladder feels full and somewhat uncomfortable.  Bladder scan on arrival .  PMH: Past Medical History:  Diagnosis Date   Anxiety    Arthritis    joints and hands/ knees   Asthma    uses inhaler   Benign essential tremor    head   Cervical dystonia    neck pain   Cholesteatoma of left ear    x2   COPD (chronic obstructive pulmonary disease) (HCC)    Cough    Depression    Diabetes mellitus without complication (HCC)    type 2   Diastolic dysfunction    Dyspnea    Dysrhythmia    diastolic dysfunction   GERD (gastroesophageal reflux disease)    Headache    migraines/ one per week   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   Hyperlipidemia    Hypertension    Motion sickness    boat   Neuromuscular disorder (HCC)    neuropathy feet and hands( nerve damage)   Wears dentures    upper and lower    Surgical History: Past Surgical History:  Procedure Laterality Date   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   Procedure: AMPUTATION, TOE;  Surgeon: Lennie Barter, DPM;  Location: [facility];  Service: Orthopedics/Podiatry;  Laterality: Left;  2nd Toe amputation   CARPAL TUNNEL RELEASE Bilateral    x2 right, 1x on left   COLONOSCOPY     COLONOSCOPY WITH PROPOFOL  N/A 04/25/2017   Procedure: COLONOSCOPY WITH PROPOFOL ;  Surgeon: Jinny Carmine, MD;  Location: [facility];  Service: Endoscopy;  Laterality: N/A;  diabetic-oral med    DILATION AND CURETTAGE OF UTERUS     ESOPHAGOGASTRODUODENOSCOPY (EGD) WITH PROPOFOL  N/A 01/10/2021   Procedure: ESOPHAGOGASTRODUODENOSCOPY (EGD) WITH PROPOFOL ;  Surgeon: Janalyn Keene NOVAK, MD;  Location: [facility];  Service: Endoscopy;  Laterality: N/A;  Diabetic   EXTERNAL EAR SURGERY Left    x2   IRRIGATION AND DEBRIDEMENT FOOT Right 11/14/2023   Procedure: IRRIGATION AND DEBRIDEMENT FOOT;  Surgeon: Lennie Barter, DPM;  Location: [facility];  Service: Orthopedics/Podiatry;  Laterality: Right;   POLYPECTOMY  04/25/2017   Procedure: POLYPECTOMY INTESTINAL;  Surgeon: Jinny Carmine, MD;  Location: [facility];  Service: Endoscopy;;   SPINE SURGERY     herniated disc   TUBAL LIGATION     WOUND DEBRIDEMENT Bilateral 11/14/2023   Procedure: DEBRIDEMENT, WOUND;  Surgeon: Lennie Barter, DPM;  Location: [facility];  Service: Orthopedics/Podiatry;  Laterality: Bilateral;    Home Medications:  Allergies as of 02/18/2024       Reactions   Augmentin  [amoxicillin -pot Clavulanate] Diarrhea   Penicillins Itching        Medication List        Accurate as of February 18, 2024  5:14 PM. If you have any questions, ask your nurse or doctor.          carbidopa -levodopa  50-200 MG tablet Commonly known as: SINEMET   CR Take 1 tablet by mouth at bedtime. The timing of this medication is very important.   carbidopa -levodopa  10-100 MG tablet Commonly known as: SINEMET  IR Take 1 tablet by mouth 3 (three) times daily with meals. The timing of this medication is very important.   acetaminophen  650 MG suppository Commonly known as: TYLENOL  Place 1 suppository (650 mg total) rectally every 4 (four) hours as needed for fever (> 101.5).   ascorbic acid  500 MG tablet Commonly known as: VITAMIN C  Take 1 tablet (500 mg total) by mouth 2 (two) times daily.   calcium  carbonate 500 MG chewable tablet Commonly known as: TUMS - dosed in mg elemental calcium  Chew 2 tablets (400 mg of  elemental calcium  total) by mouth 3 (three) times daily as needed for indigestion or heartburn.   colchicine  0.6 MG tablet Take 1 tablet (0.6 mg total) by mouth 2 (two) times daily as needed for up to 3 days (gout flare).   collagenase  250 UNIT/GM ointment Commonly known as: SANTYL  Apply topically daily.   cyclobenzaprine  10 MG tablet Commonly known as: FLEXERIL  Take 10 mg by mouth 3 (three) times daily.   DULoxetine  60 MG capsule Commonly known as: CYMBALTA  Take 1 capsule (60 mg total) by mouth daily.   furosemide  40 MG tablet Commonly known as: LASIX  Take 40 mg by mouth daily.   gabapentin  600 MG tablet Commonly known as: NEURONTIN  Take 600 mg by mouth 2 (two) times daily.   hydrOXYzine  25 MG tablet Commonly known as: ATARAX  Take 1 tablet (25 mg total) by mouth 2 (two) times daily as needed. Home med.   ipratropium-albuterol  0.5-2.5 (3) MG/3ML Soln Commonly known as: DuoNeb Inhale 3 mLs into the lungs every 6 (six) hours as needed.   lidocaine  5 % Commonly known as: LIDODERM  Place 1 patch onto the skin daily. Remove & Discard patch within 12 hours or as directed by MD   lipase/protease/amylase 12000-38000 units Cpep capsule Commonly known as: CREON  Take 1 capsule (12,000 Units total) by mouth 3 (three) times daily before meals.   montelukast  10 MG tablet Commonly known as: SINGULAIR  Take 10 mg by mouth daily.   morphine  15 MG tablet Commonly known as: MSIR Take 15 mg by mouth 3 (three) times daily as needed.   multivitamin with minerals Tabs tablet Take 1 tablet by mouth daily.   omeprazole  40 MG capsule Commonly known as: PRILOSEC Take 1 capsule (40 mg total) by mouth daily.   predniSONE  10 MG tablet Commonly known as: DELTASONE  Take 10 mg by mouth daily.   QUEtiapine  25 MG tablet Commonly known as: SEROQUEL  Take 1 tablet (25 mg total) by mouth at bedtime.   rosuvastatin  5 MG tablet Commonly known as: CRESTOR  Take 1 tablet (5 mg total) by mouth at  bedtime.   Symbicort  160-4.5 MCG/ACT inhaler Generic drug: budesonide -formoterol  INHALE 2 PUFFS TWICE A DAY RINSE MOUTH WITH WATER  AFTER EACH USE   theophylline  400 MG 24 hr tablet Commonly known as: UNIPHYL Take 400 mg by mouth daily.   tiotropium 18 MCG inhalation capsule Commonly known as: SPIRIVA  Place 1 capsule into inhaler and inhale daily. pm   Ventolin  HFA 108 (90 Base) MCG/ACT inhaler Generic drug: albuterol  INHALE 1 PUFF BY MOUTH AS NEEDED   Vitamin D  (Ergocalciferol ) 1.25 MG (50000 UNIT) Caps capsule Commonly known as: DRISDOL  Take 1 capsule (50,000 Units total) by mouth every 7 (seven) days.   zinc  sulfate (50mg  elemental zinc ) 220 (50 Zn) MG capsule Take 1 capsule (220  mg total) by mouth daily.        Allergies:  Allergies[1]  Family History: Family History  Problem Relation Age of Onset   Emphysema Mother    Anxiety disorder Mother    Stroke Father    Throat cancer Father    Lung cancer Maternal Grandmother    Lung cancer Maternal Grandfather    Hypertension Daughter    Diabetes Daughter    Multiple sclerosis Daughter    Bipolar disorder Daughter    Cervical cancer Daughter    Bipolar disorder Daughter    Drug abuse Daughter    Lung cancer Maternal Aunt    Lung cancer Maternal Uncle     Social History:   reports that she has been smoking cigarettes. She started smoking about 46 years ago. She has a 46.8 pack-year smoking history. She has never used smokeless tobacco. She reports that she does not drink alcohol and does not use drugs.  Physical Exam: BP 125/81   Pulse 94   Constitutional:  Alert and oriented, no acute distress, nontoxic appearing HEENT: [place], AT Cardiovascular: No clubbing, cyanosis, or edema Respiratory: Normal respiratory effort, no increased work of breathing Skin: No rashes, bruises or suspicious lesions Neurologic: Grossly intact, no focal deficits, moving all 4 extremities Psychiatric: Normal mood and  affect  Laboratory Data: Results for orders placed or performed in visit on 02/18/24  Bladder Scan (Post Void Residual) in office   Collection Time: 02/18/24  3:19 PM  Result Value Ref Range   Scan Result 405    Simple Catheter Placement  Due to urinary retention patient is present today for a foley cath placement.  Patient was cleaned and prepped in a sterile fashion with betadine. A 16 FR foley catheter was inserted, urine return was noted  , urine was yellow in color.  The balloon was filled with 10cc of sterile water .  A leg bag was attached for drainage. Patient was also given a night bag to take home and was given instruction on how to change from one bag to another.  Patient was given instruction on proper catheter care.  Patient tolerated well, no complications were noted   Performed by: Tyrika Newman, PA-C   Assessment & Plan:   1. Urinary retention (Primary) Voiding trial failed.  Foley catheter replaced as above.  We discussed possible contributors including recent hospitalization/immobility, diabetes, Parkinson's, cervical stenosis.  Will defer urodynamics for now since she was not able to generate any voids and she dissipates difficulty getting to [place].  Urine sample obtained from new catheter today for UA/culture, will treat as indicated since she previously had an episode of retention attributed to UTI.  Will repeat voiding trial in 2 to 3 weeks. - Bladder Scan (Post Void Residual) in office - Urinalysis, Complete - CULTURE, URINE COMPREHENSIVE  Return in about 2 weeks (around 03/03/2024) for Voiding trial.  Parmvir Boomer, PA-C  [facility] [address] [place], [place] [address] [phone]      [1]  Allergies Allergen Reactions   Augmentin  [Amoxicillin -Pot Clavulanate] Diarrhea   Penicillins Itching   "

## 2024-02-19 LAB — MICROSCOPIC EXAMINATION: WBC, UA: >30 /HPF — AB (ref 0–5)

## 2024-02-19 LAB — URINALYSIS, COMPLETE
Bilirubin, UA: NEGATIVE
Glucose, UA: NEGATIVE
Ketones, UA: NEGATIVE
Nitrite, UA: POSITIVE — AB
Specific Gravity, UA: 1.015 (ref 1.005–1.030)
Urobilinogen, Ur: 0.2 mg/dL (ref 0.2–1.0)
pH, UA: 6.5 (ref 5.0–7.5)

## 2024-02-22 LAB — CULTURE, URINE COMPREHENSIVE

## 2024-02-24 ENCOUNTER — Ambulatory Visit (INDEPENDENT_AMBULATORY_CARE_PROVIDER_SITE_OTHER): Admitting: Family Medicine

## 2024-02-24 ENCOUNTER — Ambulatory Visit

## 2024-02-24 ENCOUNTER — Encounter: Payer: Self-pay | Admitting: Family Medicine

## 2024-02-24 VITALS — BP 122/68 | HR 101 | Resp 16 | Ht 68.0 in | Wt 144.6 lb

## 2024-02-24 DIAGNOSIS — G20A1 Parkinson's disease without dyskinesia, without mention of fluctuations: Secondary | ICD-10-CM

## 2024-02-24 DIAGNOSIS — J9611 Chronic respiratory failure with hypoxia: Secondary | ICD-10-CM | POA: Diagnosis not present

## 2024-02-24 DIAGNOSIS — S8001XA Contusion of right knee, initial encounter: Secondary | ICD-10-CM

## 2024-02-24 DIAGNOSIS — Z89422 Acquired absence of other left toe(s): Secondary | ICD-10-CM | POA: Diagnosis not present

## 2024-02-24 DIAGNOSIS — G894 Chronic pain syndrome: Secondary | ICD-10-CM

## 2024-02-24 DIAGNOSIS — S81801D Unspecified open wound, right lower leg, subsequent encounter: Secondary | ICD-10-CM | POA: Diagnosis not present

## 2024-02-24 DIAGNOSIS — R296 Repeated falls: Secondary | ICD-10-CM

## 2024-02-24 NOTE — Progress Notes (Signed)
 `Ref Provider: Referral, Self  PCP: Glenard Dorette FALCON, MD  Assessment and Plan:   In most patients, we give written parts of the assessment and plan to put the patient under Patient Instructions/After Visit Summary. So some parts are directed to the patient.  Dear [name], It was our pleasure to participate in your care. We have typed up a summary of what we discussed. Assessment & Plan Cervical spinal stenosis with radiculopathy Cervical spinal stenosis at C3 through C6 with associated radiculopathy. Symptoms include numbness in feet, likely related to cervical spine changes. Gabapentin  is not recommended for numbness as it is unlikely to be effective. - Ensure follow-up appointment with neurosurgery is scheduled. - Continue physical therapy at home.  Parkinson's disease Managed with Sinemet . Symptoms include increased falls, balance issues, and dizziness. Recent hospitalizations for sepsis and falls. No current use of blood thinners. Appetite increased due to prednisone  use. Some difficulty swallowing, particularly with steak. Incontinence noted, possibly related to cervical spine changes. - Continue Sinemet  for Parkinson's disease, refill  - Referred to [facility] neurology for further evaluation and management. - Continue physical therapy at home.  Neuropathy - Would advise against increasing Gabapentin  at this time, as unlikely to help with numbness and due to high pill burden   Follow-up in 6 months    Interim History date 02/24/2024   Tricia [name] is a 64 y.o. female here for treatment and evaluation of Tremors  Tricia [name] last visit was on 09/04/2022  States she had a fall last week.  Not on blood thinners.  No trouble swallowing.  Appetite is good.  Has a catheter for urinary incontinence.  No bowel incontinence.  Is being sent to neurosurgery but she has not heard from them.  Patient with sleep medication, feels this helps.  Patient states she can have  occasional lightheadedness.  No known trigger.  Can feel like she is having swimmy headedness.  Is having at home Physical Therapy.   Complains of worsening numbness in her bilateral feet.    Patient states her Parkinson's disease symptoms are worse some days. She has had several falls.  Taking sinemet  25/100 mg 1.5 pills three times a day and sinemet  CR 50/200 mg 1 pill nightly.   History of Present Illness Tricia [name] is a 64 year old female with Parkinson's disease who presents with increased falls and dizziness.  Gait instability and falls - Increased frequency of falls, with the most recent fall occurring last Thursday - Describes balance issues and a sensation of 'swinging head' - No head injury sustained during the recent fall - Multiple prior hospitalizations for falls and infections, including sepsis secondary to cellulitis from anterior shin wounds - No longer on blood thinners  Dizziness and lightheadedness - Intermittent episodes of dizziness and lightheadedness that 'just comes on' - Describes feeling 'drunk' during episodes  Parkinsonism and associated symptoms - History of Parkinson's disease, currently treated with Sinemet  - Difficulty swallowing, particularly with steak - Bowel and bladder incontinence, has not required catheterization - Increased appetite attributed to prednisone  use  Right hand weakness and cervical stenosis - Referred to outpatient neurosurgery for right hand drop - MRI demonstrates cervical spinal stenosis from C3 through C6 - Follow-up appointment with neurosurgery not yet scheduled  Peripheral neuropathy - Numbness in feet, attributed to 'the rock'  Renal function - Most recent laboratory results: EGFR 54, creatinine 1.13  Opioid use and encephalopathy - History of opioid use, with positive urinary drug screen and constricted pupils  during previous hospitalization - Prior episode of acute toxic encephalopathy, now  resolved   Disease Summary: (Aggregate of information from previous visits)     Tricia [name] is a right-handed, Caucasian female  History of Essential tremor in a patient with possible right hemibody Parkinsonism in patient with family history- improved tremors with carbidopa -levodopa . Balance is considerably worsened, resting tremor bilateral hands and face, shuffled gait: The patient has had tremors since around 2006, when initially she was diagnosed with benign essential tremor at [facility] by movement disorder specialist. She has mostly tremors of her head and very few medications have helped her in the past. Patient reports difficulty holding a cup, difficulty removing her teeth.   Patient reports imbalance with fall resulting in bruise under her left breast near her rib area and under her left arm. Previously ordered Physical Therapy for balance in patient with parkinsonism.  09/04/2022: Results of 07/13/2022 Syn-One biopsy discussed with patient  Synucleinopathy: There is no pathologic evidence of phosphorylated alpha-synuclein deposition within cutaneous  nerves. The absence of alpha-synuclein does not exclude a diagnosis of synucleinopathy.  Small Fiber Neuropathy: There was reduced intraepidermal nerve fiber density. This can be seen in peripheral and  central neurodegenerative disorders as well as small fiber neuropathy and polyneuropathies.  Amyloidosis: There is no pathologic evidence of amyloid deposition in cutaneous nerves. A normal [place]  red stain does not exclude a diagnosis of amyloidosis  Minimally abnormal study. No evidence of alpha-synuclein but the distal leg shows signs of small fiber neuropathy      Medications: Propranolol (ineffective), Primidone (ineffective), Topiramate, carbidopa -levodopa  25/100 in the morning and 50/200 in the evening Not a good candidate for DBS (seen by Dr. Glade at [facility] 08/2014)  Peripheral neuropathy + Carpal tunnel syndrome +  diabetic polyneuropathy - no improvement: patient reports bilateral foot numbness and swelling. She also reports changes in her balance. On exam patient has decreased sensation to light touch of both feet. Patient using wrist splints for carpal tunnel syndrome.   NCS/EMG 10/18/2014: This is an abnormal electrodiagnostic study consistent with a generalized mild sensorimotor polyneuropathy  Medications: Gabapentin , Cymbalta     Cervical Dystonia:  She has noticed that she has deviation of her head to the left since around 2005 which she has significant pain in her neck. She also has positive sensory trick where if she touches her chin she feels like she can make her head tilting a little bit better. She denied any history of neck trauma to cause hematoma followed by scar formation.  Botulinum Toxin injections (200 units) helped but stopped due to financial reasons,    Back pain. She has long-standing back pain and upper neck pain. has seen physiatry, neurosurgery.   Migraine headache, severe intractable with and without aura, currently experiencing 2 migraines a week, takes aleve  first, if headache is relieved then knows its a migraine she will take Maxalt : During her typical headaches, she has pain and stiffness in her neck which goes to the back of her head and then front and behind her eyes. She has photophobia, phonophobia, physical activity makes it worse. Becomes irritable. Sleep helps.  Medications tried: Preventive: Topiramate (cognitive side effects), Gabapentin , Emgality, Ajovy, Botox, Effexor   Rescue: Ibuprofen, Sumatriptan , Flexeril , Ketorolac , Maxalt , Prednisone  dose taper, Phenergan , Ubrelvy   Memory: She felt like this started around 2009-2010 time period but late 2012 it has gotten significantly worse. Lately she forgot to go to work. 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 She has to  put a lot of sticky notes everywhere. She forgets her own phone number and address. When she is  driving she had a hard time finding her own house. She feels like she has a poor sense of direction (that is chronic per patient). She mentioned that she is not having any problems paying [name] or keeping up with her finances. Her cooking habits and other housekeeping has not changed. She's not had any head trauma. There was no change in her medications. She feels like her anxiety and depression are also getting worse.   Per daughter she seemed to be very confused on 09/29/2021. She was moving things around the house and blaming others and showing signs of odd behavior. Per loved one she was mentioned buying clothes for pasted loves and not being able to recognize her own daughter. She was coherent throughout her episode which lasted roughly 12 hours. Patient states she did start a new medication (Seroquel  25 mg) on 09/19/2021 and wonders if this could have played a roll in her behavior.   MRI of brain 10/2013: She has one very small area of DWI change which is questionable corresponding to the [facility] which is very pinpoint in nature in her right subcortical posterior frontal region. Radiologist has not called it DWI positive lesion. She has very minimal white matter changes.  Hospitalization for pneumonia 09/08/2021 with possible delirium lasting one day, likely multifactorial. Do not feel it is side effect of Seroquel  at this time. Continue current treatment plan.   Anxiety and Depression: Patient on Cymbalta   History of COPD: Recommended reaching out to Dr. Brigida for management of COPD, patient interested in steroid taper.  Insomnia: Previously tried on trazodone . Switched to Seroquel .  Tobacco abuse: Patient tried on Chantix .   History of Vitamin D  Deficiency: Patient supplementing.   Physical Exam   Vitals There were no vitals filed for this visit.    There is no height or weight on file to calculate BMI.  (Some of the exam changes noted are from previous clinical observations)  General  Exam Lung exam rhonchi  Bilateral paraspinal tenderness R> L  Right Trap tenderness  Patient smells of cigarette smoke.  Neurological Exam Face is symmetric, She does have head deviation to her left with some torticollis and some laterocollis. Tremors in both hands mostly when she outstretches her arms but I did not see any significant bradykinesia or cogwheeling.  She does have some weakness in her hands left more than right mostly in her intrinsic hand muscles. Sensations decreased to light touch and vibration and proprioception. Deep Tendon Reflexes Reduced. Gait and station are normal when examined in small exam room  Medications: Current Outpatient Medications on File Prior to Visit  Medication Sig Dispense Refill   albuterol  MDI, PROVENTIL , VENTOLIN , PROAIR , HFA 90 mcg/actuation inhaler Inhale 2 inhalations into the lungs every 6 (six) hours as needed for Wheezing 18 g 10   aspirin  81 MG EC tablet Take 1 tablet (81 mg total) by mouth once daily     busPIRone  (BUSPAR ) 7.5 MG tablet Take by mouth     carbidopa -levodopa  (SINEMET  CR) 50-200 mg CR tablet Take 1 tablet by mouth at bedtime 90 tablet 1   carbidopa -levodopa  (SINEMET ) 25-100 mg tablet TAKE 1 and 1/2 TABLETS BY MOUTH 3 TIMES DAILY 405 tablet 1   cetirizine  (ZYRTEC ) 10 MG tablet Take 1 tablet by mouth once a day as needed [TAKE 1 TABLET BY MOUTH EVERY DAY]     colchicine  (COLCRYS ) 0.6  mg tablet Take 0.6 mg by mouth 2 (two) times daily as needed     cyclobenzaprine  (FLEXERIL ) 10 MG tablet Take by mouth     DULoxetine  (CYMBALTA ) 60 MG DR capsule Take 1 capsule by mouth once daily     empagliflozin  (JARDIANCE ) 25 mg tablet Take 25 mg by mouth     fluticasone  propionate (FLONASE ) 50 mcg/actuation nasal spray Place 2 sprays into one nostril once daily     FUROsemide  (LASIX ) 40 MG tablet TAKE 1 TABLET BY MOUTH ONCE DAILY (Patient not taking: Reported on 12/30/2023) 90 tablet 0   gabapentin  (NEURONTIN ) 600 MG tablet TAKE  2 TABLETS BY MOUTH 3  TIMES DAILY 540 tablet 3   hydrOXYzine  (ATARAX ) 25 MG tablet Take by mouth     ibuprofen (MOTRIN) 600 MG tablet Take 1 tablet (600 mg total) by mouth every 6 (six) hours as needed for Pain Take three times per week for 2-3 days 45 tablet 0   insulin  GLARGINE (LANTUS  SOLOSTAR) pen injector (concentration 100 units/mL) Inject 20-50 Units subcutaneously once daily (Patient not taking: Reported on 12/30/2023)     ipratropium (ATROVENT ) 0.06 % nasal spray Place 2 sprays into one nostril 4 (four) times daily     ipratropium-albuteroL  (DUO-NEB) nebulizer solution Take 3 mLs by nebulization 4 (four) times daily as needed for Wheezing NEED APPT FOR FURTHER REFILLS 1080 mL 2   levocetirizine (XYZAL ) 5 MG tablet Take 1 tablet by mouth every evening     methylPREDNISolone  (MEDROL  DOSEPACK) 4 mg tablet Follow package directions. (Patient not taking: Reported on 12/30/2023) 21 tablet 0   montelukast  (SINGULAIR ) 10 mg tablet TAKE 1 TABLET BY MOUTH AT BEDTIME 90 tablet 1   morphine  (MS CONTIN ) 30 MG ER tablet Take 30 mg by mouth every 12 (twelve) hours (Patient not taking: Reported on 12/30/2023)     morphine  (MSIR) 15 MG immediate release tablet Take 15 mg by mouth 4 (four) times daily     naproxen  (NAPROSYN ) 500 MG tablet Take 1 tablet by mouth as needed     omeprazole  (PRILOSEC) 40 MG DR capsule Take 1 capsule (40 mg total) by mouth once daily 90 capsule 1   oxyCODONE  (OXYCONTIN ) 10 MG CR tablet Take 15 mg by mouth     predniSONE  (DELTASONE ) 10 MG tablet Take 1 tablet (10 mg total) by mouth as directed 4 pills q day x 2 days, 3 pills q day x 2 days, 2 pills q day x 2 days, 1 pill q day x 2 days. (Patient not taking: Reported on 12/30/2023) 20 tablet 0   predniSONE  (DELTASONE ) 10 MG tablet Take 1 tablet (10 mg total) by mouth once daily 30 tablet 1   promethazine  (PHENERGAN ) 25 MG tablet Take 1 tablet (25 mg total) by mouth once daily as needed 30 tablet 0   QUEtiapine   (SEROQUEL ) 25 MG tablet Take 25 mg by mouth at bedtime Take 1 tablet (25 mg total) by mouth at bedtime.     RELISTOR 150 mg tablet Take 1 tablet by mouth 3 (three) times a day     rosuvastatin  (CRESTOR ) 5 MG tablet Take 1 tablet by mouth once daily     SPIRIVA  WITH HANDIHALER 18 mcg inhalation capsule INHALE THE CONTENTS OF 1 CAPSULE BY MOUTH VIA HANDIHALER DAILY 90 capsule 3   SSD 1 % cream      SYMBICORT  160-4.5 mcg/actuation inhaler INHALE 2 PUFFS TWICE A DAY RINSE MOUTH WITH WATER  AFTER EACH USE 30.6 g 3  theophylline  (UNI-DUR ) 400 mg 24 hr tablet Take 1 tablet (400 mg total) by mouth once daily 30 tablet 2   tiotropium (SPIRIVA  WITH HANDIHALER) 18 mcg inhalation capsule Place 1 capsule (18 mcg total) into inhaler and inhale once daily 90 capsule 3   TORsemide  (DEMADEX ) 20 MG tablet Take 20 mg by mouth once daily     XTAMPZA  ER 18 mg ER capsule Take 18 mg by mouth     ZTLIDO  1.8 % PtMd      No current facility-administered medications on file prior to visit.   Past Medical History:  Past Medical History:  Diagnosis Date   Asthma without status asthmaticus (HHS-HCC)    Back pain    lower   Carpal tunnel syndrome    Cervical dystonia    Chickenpox    Cholelithiasis    COPD (chronic obstructive pulmonary disease) (CMS/HHS-HCC)    Stage II   Degenerative joint disease    Depression    Diabetes mellitus type 2, uncomplicated (CMS/HHS-HCC)    Fatty liver    Gastric polyps    Gout    Hives    Hyperlipidemia    Lung disease    Migraines    Nicotine abuse    Osteoarthritis    Tremor     Past Surgical History:  Past Surgical History:  Procedure Laterality Date   BTL     CARPAL TUNNEL RELEASE Bilateral    Right x 2,    Cholesteatoma     left ear   Family History:  Family History  Problem Relation Name Age of Onset   High blood pressure (Hypertension) Mother     Diabetes Mother     Asthma Mother     Osteoporosis (Thinning of bones)  Mother     Mental illness Mother     Emphysema Mother     Throat cancer Father     High blood pressure (Hypertension) Father     Stroke Father     Rheum arthritis Father     Tremor Father     Bipolar disorder Daughter     COPD Other + FH    Lung cancer Other + FH    Breast cancer Other + FH    Prostate cancer Other + FH    Parkinsonism Other + FH    Lung cancer Maternal Grandmother     Parkinsonism Paternal Grandmother     Prostate cancer Paternal Grandfather     Social History:  Social History   Socioeconomic History   Marital status: Divorced  Occupational History   Occupation: Disability  Tobacco Use   Smoking status: Every Day    Current packs/day: 1.00    Types: Cigarettes   Smokeless tobacco: Never   Tobacco comments:    Smokes one pack of cigarettes a day.  Smoking since she was 16.        Patient confirms cutting down to 3/4 ppd. 06/18/2023 -IC  Vaping Use   Vaping status: Never Used  Substance and Sexual Activity   Alcohol use: No    Comment: occasional   Drug use: No   Sexual activity: Defer   Social Drivers of Health   Financial Resource Strain: Patient Declined (06/18/2023)   Overall Financial Resource Strain (CARDIA)    Difficulty of Paying Living Expenses: Patient declined  Recent Concern: Financial Resource Strain - High Risk (06/18/2023)   Overall Financial Resource Strain (CARDIA)    Difficulty of Paying Living Expenses: Very hard  Food Insecurity:  Food Insecurity Present (01/21/2024)   Received from [facility]   Hunger Vital Sign    Within the past 12 months, you worried that your food would run out before you got the money to buy more.: Sometimes true    Within the past 12 months, the food you bought just didn't last and you didn't have money to get more.: Sometimes true  Transportation Needs: Unmet Transportation Needs (01/21/2024)   Received from [facility] - Transportation    In the past 12 months,  has lack of transportation kept you from medical appointments or from getting medications?: Yes    In the past 12 months, has lack of transportation kept you from meetings, work, or from getting things needed for daily living?: Yes  Physical Activity: Sufficiently Active (03/07/2023)   Received from [facility]   Exercise Vital Sign    On average, how many days per week do you engage in moderate to strenuous exercise (like a brisk walk)?: 7 days    On average, how many minutes do you engage in exercise at this level?: 30 min  Stress: No Stress Concern Present (03/07/2023)   Received from [facility] - Occupational Stress Questionnaire    Feeling of Stress : Only a little  Social Connections: Socially Isolated (08/29/2023)   Received from [facility]   Social Connection and Isolation Panel    In a typical week, how many times do you talk on the phone with family, friends, or neighbors?: More than three times a week    How often do you get together with friends or relatives?: Never    How often do you attend church or religious services?: Never    Do you belong to any clubs or organizations such as church groups, unions, fraternal or athletic groups, or school groups?: No    How often do you attend meetings of the clubs or organizations you belong to?: Never    Are you married, widowed, divorced, separated, never married, or living with a partner?: Divorced  Housing Stability: Patient Declined (06/18/2023)   Housing Stability Vital Sign    Unable to Pay for Housing in the Last Year: Patient declined    Number of Times Moved in the Last Year: 0    Homeless in the Last Year: Patient declined   Allergies:  Allergies  Allergen Reactions   Augmentin  [Amoxicillin -Pot Clavulanate] Unknown   Penicillins Unknown   This note has been created using automated tools and reviewed for accuracy by KAITLIN CAFFARO.  Parts of this note were created  with a combination of Dragon dictation and automated scribing services. Please note any typographical errors are unintentional. I apologize for any typographical errors that were not detected and corrected.   No follow-ups on file. The patient will contact us  earlier if severity of neurologic symptoms increases or changes. This may or may not, depending on description of symptoms, necessitate an earlier appointment.    Payor: [facility] MEDICARE ADVANTAGE PLAN / Plan: UHC DUAL COMPLETE HMO POS SNP / Product Type: HMO /    Attestation Statement:   I personally performed the service, non-incident to. (WP)   REBECCA BARTLETT, CMA   Kaitlin Caffaro, PA-C Board Certified by [facility] - Neurology  A [facility]

## 2024-02-24 NOTE — Progress Notes (Signed)
 Name: Tricia [name]   MRN: [id number]    DOB: [birth year]   Date:02/24/2024       Progress Note  Subjective  Chief Complaint  Chief Complaint  Patient presents with   Fall    Also hospital follow up, open wound leg   Discussed the use of AI scribe software for clinical note transcription with the patient, who gave verbal consent to proceed.  History of Present Illness Tricia [name] is a 64 year old female with chronic kidney disease and recurrent infections who presents for a hospital follow-up.  She was hospitalized multiple times in the past year, including a stay in December. Initially, she was hospitalized in July due to an acute kidney injury on top of chronic kidney disease, high blood pressure, and elevated creatinine levels. She passed out and was 'out of it' for four days. Her son-in-law called the ambulance, and she was told she had sepsis.  In September, she was hospitalized again and was told she had sepsis, a toe ulcer, and cellulitis. During this time, she had her left second toe amputated and had an infection. She also experienced urinary retention and had a bladder infection.  In December, she was hospitalized for another infection on her right anterior shin, which she attributes to a fall involving her wheelchair. She reports recurrent falls. She experiences chills and sometimes has pain in her leg wounds. Home health visits occur twice a week for wound care, and she changes the dressings in between visits.  She has a history of Parkinson's disease, which contributes to her balance issues and frequent falls. She uses oxygen  at home, sometimes during the day and at night. She reports chronic pain in her back, knees, and arms, which affects her mobility and desire to move. She also experiences migraines.  She is currently using a wheelchair that belonged to her late daughter and has difficulty transferring from low to high surfaces. She wants to obtain a  motorized wheelchair and safety rails for her bed to prevent falls. She has partial Medicaid coverage and is concerned about the costs associated with these needs.  She uses oxygen  at home. She has experienced wheezing and coughing. She has a few prednisone  pills left from a previous prescription.    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   Chronic prescription opiate use 09/22/2023   Frequent falls 09/22/2023   Malnutrition of moderate degree 09/02/2023   Anxiety and depression 08/29/2023   Chronic obstructive pulmonary disease (COPD) (HCC) 08/29/2023   GERD without esophagitis 08/29/2023   Diabetes mellitus without complication (HCC) 08/29/2023   CAP (community acquired pneumonia) 09/08/2021   Hypoalbuminemia due to protein-calorie malnutrition 09/08/2021   Lumbar herniated disc 07/11/2021   Calculus of gallbladder without cholecystitis without obstruction 07/11/2021   Senile purpura 06/05/2021   Atherosclerosis of aorta 06/05/2021   Parkinson's disease (HCC) 06/05/2021   Chronic kidney disease (CKD) stage G3a/A2, moderately decreased glomerular filtration rate (GFR) between 45-59 mL/min/1.73 square meter and albuminuria creatinine ratio between 30-299 mg/g (HCC) 06/05/2021   Esophageal dysphagia    Gastric erythema    Columnar-lined esophagus  Moderate malnutrition 09/26/2020   Centrilobular emphysema (HCC) 03/30/2020   History of urinary retention 11/18/2019   GAD (generalized anxiety disorder) 10/13/2019   MDD (major depressive disorder), recurrent episode,  moderate (HCC) 10/13/2019   At risk for long QT syndrome 10/13/2019   Nonrheumatic mitral valve regurgitation 05/04/2019   Benign neoplasm of descending colon    Polyp of sigmoid colon    History of Klebsiella UTI 09/11/2023 01/24/2015   Polyneuropathy 10/21/2014   Chronic venous insufficiency 10/05/2014   Bilateral leg edema 08/16/2014   Major depression in partial remission 08/16/2014   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   Asthma, moderate persistent 08/16/2014   Carpal tunnel syndrome 08/16/2014   Cervical pain 08/16/2014   CAFL (chronic airflow limitation) (HCC) 08/16/2014   Type 2 diabetes mellitus with peripheral neuropathy (HCC) 08/16/2014   Diabetes mellitus type 2, insulin  dependent (HCC) 08/16/2014   Dyslipidemia 08/16/2014   Tobacco use disorder 08/16/2014   Benign neoplasm of stomach 08/16/2014   Gout 08/16/2014   Mixed hyperlipidemia 08/16/2014   Low back pain 08/16/2014   Lumbar radiculopathy 08/16/2014   Headache, migraine 08/16/2014   Arthralgia of multiple joints 08/16/2014   Vitamin D  deficiency 08/16/2014   Primary osteoarthritis of both knees 06/22/2014   Benign essential tremor 10/02/2013   Cervical dystonia 10/02/2013   Chronic diastolic CHF (congestive heart failure) (HCC) 11/16/2012   Chronic pain 11/13/2012    Past Surgical History:  Procedure Laterality Date   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   Procedure: AMPUTATION, TOE;  Surgeon: Lennie Barter, DPM;  Location: [facility];  Service: Orthopedics/Podiatry;  Laterality: Left;  2nd Toe amputation   CARPAL TUNNEL RELEASE Bilateral    x2 right, 1x on left   COLONOSCOPY     COLONOSCOPY WITH PROPOFOL  N/A 04/25/2017   Procedure: COLONOSCOPY WITH PROPOFOL ;  Surgeon: Jinny Carmine, MD;  Location: [facility];  Service: Endoscopy;  Laterality: N/A;  diabetic-oral med   DILATION AND CURETTAGE OF UTERUS     ESOPHAGOGASTRODUODENOSCOPY (EGD) WITH PROPOFOL  N/A 01/10/2021   Procedure:  ESOPHAGOGASTRODUODENOSCOPY (EGD) WITH PROPOFOL ;  Surgeon: Janalyn Keene NOVAK, MD;  Location: [facility];  Service: Endoscopy;  Laterality: N/A;  Diabetic   EXTERNAL EAR SURGERY Left    x2   IRRIGATION AND DEBRIDEMENT FOOT Right 11/14/2023   Procedure: IRRIGATION AND DEBRIDEMENT FOOT;  Surgeon: Lennie Barter, DPM;  Location: [facility];  Service: Orthopedics/Podiatry;  Laterality: Right;   POLYPECTOMY  04/25/2017   Procedure: POLYPECTOMY INTESTINAL;  Surgeon: Jinny Carmine, MD;  Location: [facility];  Service: Endoscopy;;   SPINE SURGERY     herniated disc   TUBAL LIGATION     WOUND DEBRIDEMENT Bilateral 11/14/2023   Procedure: DEBRIDEMENT, WOUND;  Surgeon: Lennie Barter, DPM;  Location: [facility];  Service: Orthopedics/Podiatry;  Laterality: Bilateral;    Family History  Problem Relation Age of Onset   Emphysema Mother    Anxiety disorder Mother    Stroke Father    Throat cancer Father    Lung cancer Maternal Grandmother    Lung cancer Maternal Grandfather    Hypertension Daughter    Diabetes Daughter    Multiple sclerosis Daughter    Bipolar disorder Daughter    Cervical cancer Daughter    Bipolar disorder Daughter    Drug abuse Daughter    Lung cancer Maternal Aunt    Lung cancer Maternal Uncle     Social History   Tobacco Use   Smoking status: Every Day    Current  packs/day: 1.00    Average packs/day: 1 pack/day for 46.8 years (46.8 ttl pk-yrs)    Types: Cigarettes    Start date: 05/19/1977   Smokeless tobacco: Never  Substance Use Topics   Alcohol use: No    Alcohol/week: 0.0 standard drinks of alcohol    Current Medications[1]  Allergies[2]  I personally reviewed active problem list, medication list, allergies, family history with the patient/caregiver today.   ROS  Ten systems reviewed and is negative except as mentioned in HPI    Objective Physical Exam CONSTITUTIONAL: Patient appears frail  HEENT: Head atraumatic, normocephalic, neck  supple. CARDIOVASCULAR: Normal rate, regular rhythm and normal heart sounds. No murmur heard. No BLE edema. Large bruise on right anterior knee. PULMONARY: Effort normal , end inspiratory wheezing throughout  No crackles in lungs. MUSCULOSKELETAL: slow gait  soft brace right wrist  PSYCHIATRIC: Patient has a normal mood and affect. Behavior is normal. Judgment and thought content normal. SKIN: Large wound on leg.     Vitals:   02/24/24 1359  BP: 122/68  Pulse: (!) 101  Resp: 16  Weight: 144 lb 9.6 oz (65.6 kg)  Height: 5' 8 (1.727 m)    Body mass index is 21.99 kg/m.  Recent Results (from the past 2160 hours)  Comprehensive metabolic panel with GFR     Status: Abnormal   Collection Time: 01/20/24  1:56 PM  Result Value Ref Range   Sodium 134 (L) 135 - 145 mmol/L   Potassium 3.9 3.5 - 5.1 mmol/L   Chloride 103 98 - 111 mmol/L   CO2 19 (L) 22 - 32 mmol/L   Glucose, Bld 220 (H) 70 - 99 mg/dL    Comment: Glucose reference range applies only to samples taken after fasting for at least 8 hours.   BUN 28 (H) 8 - 23 mg/dL   Creatinine, Ser 7.58 (H) 0.44 - 1.00 mg/dL   Calcium  8.0 (L) 8.9 - 10.3 mg/dL   Total Protein 6.9 6.5 - 8.1 g/dL   Albumin  3.1 (L) 3.5 - 5.0 g/dL   AST 60 (H) 15 - 41 U/L   ALT 16 0 - 44 U/L   Alkaline Phosphatase 126 38 - 126 U/L   Total Bilirubin 0.4 0.0 - 1.2 mg/dL   GFR, Estimated 22 (L) >60 mL/min    Comment: (NOTE) Calculated using the CKD-EPI Creatinine Equation (2021)    Anion gap 13 5 - 15    Comment: Performed at [facility], [address] [place], [place] [address]  CBC with Differential/Platelet     Status: Abnormal   Collection Time: 01/20/24  1:56 PM  Result Value Ref Range   WBC 5.8 4.0 - 10.5 K/uL   RBC 2.74 (L) 3.87 - 5.11 MIL/uL   Hemoglobin 8.4 (L) 12.0 - 15.0 g/dL   HCT 73.4 (L) 63.9 - 53.9 %   MCV 96.7 80.0 - 100.0 fL   MCH 30.7 26.0 - 34.0 pg   MCHC 31.7 30.0 - 36.0 g/dL   RDW 83.7 (H) 88.4 - 84.4 %   Platelets  296 150 - 400 K/uL   nRBC 0.0 0.0 - 0.2 %   Neutrophils Relative % 68 %   Neutro Abs 3.9 1.7 - 7.7 K/uL   Lymphocytes Relative 23 %   Lymphs Abs 1.3 0.7 - 4.0 K/uL   Monocytes Relative 8 %   Monocytes Absolute 0.4 0.1 - 1.0 K/uL   Eosinophils Relative 0 %   Eosinophils Absolute 0.0 0.0 -  0.5 K/uL   Basophils Relative 0 %   Basophils Absolute 0.0 0.0 - 0.1 K/uL   WBC Morphology See Note     Comment: Mild Left Shift (1-5% metas, occ myelo)   RBC Morphology MORPHOLOGY UNREMARKABLE    Smear Review Normal platelet morphology    Immature Granulocytes 1 %   Abs Immature Granulocytes 0.03 0.00 - 0.07 K/uL    Comment: Performed at [facility], [address], [place], [place] [address]  Magnesium      Status: None   Collection Time: 01/20/24  1:56 PM  Result Value Ref Range   Magnesium  2.1 1.7 - 2.4 mg/dL    Comment: Performed at [facility], [address] [place], [place] [address]  Procalcitonin     Status: None   Collection Time: 01/20/24  1:56 PM  Result Value Ref Range   Procalcitonin 0.29 ng/mL    Comment: (NOTE)   Sepsis PCT Algorithm          Lower Respiratory Tract Infection                                         PCT Algorithm -----------------------------------------------------------------  <0.5 ng/mL                    <0.10 ng/mL  Associated with low           Antibiotic therapy strongly   risk for progression          discouraged. Indicates absence   to severe sepsis              of bacteria infection  and/or septic shock             --------------------------------------------------------------  0.5-2.0 ng/mL                 0.10-0.25 ng/mL  Recommended to retest         Antibiotic therapy discouraged.  PCT within 6-24 hours         Bacterial infection unlikely  ------------------------------------------------------------  >2 ng/mL                      0.26-0.50 ng/mL  Associated with high risk     Antibiotic therapy encouraged.  for  progression to severe     Bacterial infection possible  sepsis/and or septic shock    ------------------------------                                 >0.50 ng/mL                                Antibiotic therapy strongly                                 encouraged.                                Suggestive of presence of                                 bacterial infection.                                 -------------------------------------------------------------------  <  or = 0.50 ng/mL OR          < or = 0.25 OR 80% decrease in PCT  80% decrease in PCT           Antibiotic therapy   Antibiotic therapy may        may be discontinued  be discontinued                                 Performed at [facility], [address] [place], [place] [address]   Blood culture (routine x 2)     Status: None   Collection Time: 01/20/24  2:07 PM   Specimen: BLOOD  Result Value Ref Range   Specimen Description BLOOD BLOOD RIGHT ARM    Special Requests      BOTTLES DRAWN AEROBIC AND ANAEROBIC Blood Culture results may not be optimal due to an inadequate volume of blood received in culture bottles   Culture      NO GROWTH 5 DAYS Performed at [facility], [address] [place], [place] [address]    Report Status 01/25/2024 FINAL   Blood culture (routine x 2)     Status: None   Collection Time: 01/20/24  2:12 PM   Specimen: BLOOD  Result Value Ref Range   Specimen Description BLOOD BLOOD LEFT ARM    Special Requests      BOTTLES DRAWN AEROBIC AND ANAEROBIC Blood Culture results may not be optimal due to an inadequate volume of blood received in culture bottles   Culture      NO GROWTH 5 DAYS Performed at [facility], [address] [place], [place] [address]    Report Status 01/25/2024 FINAL   Troponin T, High Sensitivity     Status: Abnormal   Collection Time: 01/20/24  2:30 PM  Result Value Ref Range   Troponin T High Sensitivity 164 (HH) 0 - 19 ng/L     Comment: Critical Value, Read Back and verified with PAMELA RYAN AT 1550 ON 01/20/24 BY SS (NOTE) Biotin concentrations > 1000 ng/mL falsely decrease TnT results.  Serial cardiac troponin measurements are suggested.  Refer to the Links section for chest pain algorithms and additional  guidance. Performed at [facility], [address] [place], [place] [address]   Pro Brain natriuretic peptide     Status: Abnormal   Collection Time: 01/20/24  2:30 PM  Result Value Ref Range   Pro Brain Natriuretic Peptide 16,767.0 (H) <300.0 pg/mL    Comment: (NOTE) Age Group        Cut-Points    Interpretation  < 50 years     450 pg/mL       NT-proBNP > 450 pg/mL indicates                                ADHF is likely              50 to 75 years  900 pg/mL      NT-proBNP > 900 pg/mL indicates          ADHF is likely  > 75 years      1800 pg/mL     NT-proBNP > 1800 pg/mL indicates          ADHF is likely  All ages    Results between       Indeterminate. Further clinical             300 and the cut-   information is needed to determine            point for age group   if ADHF is present.                                                             Elecsys proBNP II/ Elecsys proBNP II STAT           Cut-Point                       Interpretation  300 pg/mL                    NT-proBNP <300pg/mL indicates                             ADHF is not likely  Performed at [facility], [address] [place], [place] [address]   Blood gas, venous     Status: Abnormal   Collection Time: 01/20/24  2:30 PM  Result Value Ref Range   pH, Ven 7.35 7.25 - 7.43   pCO2, Ven 36 (L) 44 - 60 mmHg   pO2, Ven 64 (H) 32 - 45 mmHg   Bicarbonate 19.9 (L) 20.0 - 28.0 mmol/L   Acid-base deficit 5.1 (H) 0.0 - 2.0 mmol/L   O2 Saturation 91 %   Patient temperature 37.0    Collection site VEIN     Comment: Performed at [facility], [address]  [place], [place] [address]  Lactic acid, plasma     Status: None   Collection Time: 01/20/24  2:30 PM  Result Value Ref Range   Lactic Acid, Venous 1.6 0.5 - 1.9 mmol/L    Comment: Performed at [facility], [address], [place], [place] [address]  Resp panel by RT-PCR (RSV, Flu A&B, Covid) Anterior Nasal Swab     Status: None   Collection Time: 01/20/24  2:55 PM   Specimen: Anterior Nasal Swab  Result Value Ref Range   SARS Coronavirus 2 by RT PCR NEGATIVE NEGATIVE    Comment: (NOTE) SARS-CoV-2 target nucleic acids are NOT DETECTED.  The SARS-CoV-2 RNA is generally detectable in upper respiratory specimens during the acute phase of infection. The lowest concentration of SARS-CoV-2 viral copies this assay can detect is 138 copies/mL. A negative result does not preclude SARS-Cov-2 infection and should not be used as the sole basis for treatment or other patient management decisions. A negative result may occur with  improper specimen collection/handling, submission of specimen other than nasopharyngeal swab, presence of viral mutation(s) within the areas targeted by this assay, and inadequate number of viral copies(<138 copies/mL). A negative result must be combined with clinical observations, patient history, and epidemiological information. The expected result is Negative.  Fact Sheet for Patients:  bloggercourse.com  Fact Sheet for Healthcare Providers:  seriousbroker.it  This test is no t yet approved or cleared by the [place]  FDA and  has been authorized for detection and/or diagnosis of SARS-CoV-2 by FDA under an Emergency  Use Authorization (EUA). This EUA will remain  in effect (meaning this test can be used) for the duration of the COVID-19 declaration under Section 564(b)(1) of the Act, 21 U.S.C.section 360bbb-3(b)(1), unless the authorization is terminated  or revoked sooner.       Influenza A by PCR  NEGATIVE NEGATIVE   Influenza B by PCR NEGATIVE NEGATIVE    Comment: (NOTE) The Xpert Xpress SARS-CoV-2/FLU/RSV plus assay is intended as an aid in the diagnosis of influenza from Nasopharyngeal swab specimens and should not be used as a sole basis for treatment. Nasal washings and aspirates are unacceptable for Xpert Xpress SARS-CoV-2/FLU/RSV testing.  Fact Sheet for Patients: bloggercourse.com  Fact Sheet for Healthcare Providers: seriousbroker.it  This test is not yet approved or cleared by the [place]  FDA and has been authorized for detection and/or diagnosis of SARS-CoV-2 by FDA under an Emergency Use Authorization (EUA). This EUA will remain in effect (meaning this test can be used) for the duration of the COVID-19 declaration under Section 564(b)(1) of the Act, 21 U.S.C. section 360bbb-3(b)(1), unless the authorization is terminated or revoked.     Resp Syncytial Virus by PCR NEGATIVE NEGATIVE    Comment: (NOTE) Fact Sheet for Patients: bloggercourse.com  Fact Sheet for Healthcare Providers: seriousbroker.it  This test is not yet approved or cleared by the [place]  FDA and has been authorized for detection and/or diagnosis of SARS-CoV-2 by FDA under an Emergency Use Authorization (EUA). This EUA will remain in effect (meaning this test can be used) for the duration of the COVID-19 declaration under Section 564(b)(1) of the Act, 21 U.S.C. section 360bbb-3(b)(1), unless the authorization is terminated or revoked.  Performed at [facility], [address] [place], [place] [address]   Troponin T, High Sensitivity     Status: Abnormal   Collection Time: 01/20/24  5:00 PM  Result Value Ref Range   Troponin T High Sensitivity 126 (HH) 0 - 19 ng/L    Comment: Critical value noted. Value is consistent with previously reported and called value  SS (NOTE) Biotin concentrations > 1000 ng/mL falsely decrease TnT results.  Serial cardiac troponin measurements are suggested.  Refer to the Links section for chest pain algorithms and additional  guidance. Performed at [facility], [address] [place], [place] [address]   CBC     Status: Abnormal   Collection Time: 01/20/24  8:59 PM  Result Value Ref Range   WBC 3.8 (L) 4.0 - 10.5 K/uL   RBC 2.62 (L) 3.87 - 5.11 MIL/uL   Hemoglobin 8.0 (L) 12.0 - 15.0 g/dL   HCT 74.5 (L) 63.9 - 53.9 %   MCV 96.9 80.0 - 100.0 fL   MCH 30.5 26.0 - 34.0 pg   MCHC 31.5 30.0 - 36.0 g/dL   RDW 83.5 (H) 88.4 - 84.4 %   Platelets 262 150 - 400 K/uL   nRBC 0.0 0.0 - 0.2 %    Comment: Performed at [facility], [address] [place], [place] [address]  Creatinine, serum     Status: Abnormal   Collection Time: 01/20/24  8:59 PM  Result Value Ref Range   Creatinine, Ser 2.30 (H) 0.44 - 1.00 mg/dL   GFR, Estimated 23 (L) >60 mL/min    Comment: (NOTE) Calculated using the CKD-EPI Creatinine Equation (2021) Performed at [facility], [address] [place], [place] [address]   Vancomycin , random     Status: None   Collection Time: 01/20/24  8:59 PM  Result Value Ref Range   Vancomycin  Rm 17 ug/mL    Comment: Performed at [facility], [address] [place], [place] [address]  Basic metabolic panel     Status: Abnormal   Collection Time: 01/21/24  4:25 AM  Result Value Ref Range   Sodium 137 135 - 145 mmol/L   Potassium 4.0 3.5 - 5.1 mmol/L   Chloride 105 98 - 111 mmol/L   CO2 20 (L) 22 - 32 mmol/L   Glucose, Bld 146 (H) 70 - 99 mg/dL    Comment: Glucose reference range applies only to samples taken after fasting for at least 8 hours.   BUN 31 (H) 8 - 23 mg/dL   Creatinine, Ser 7.83 (H) 0.44 - 1.00 mg/dL   Calcium  7.9 (L) 8.9 - 10.3 mg/dL   GFR, Estimated 25 (L) >60 mL/min    Comment: (NOTE) Calculated using the CKD-EPI Creatinine Equation  (2021)    Anion gap 12 5 - 15    Comment: Performed at [facility], [address] [place], [place] [address]  CBC     Status: Abnormal   Collection Time: 01/21/24  4:25 AM  Result Value Ref Range   WBC 4.3 4.0 - 10.5 K/uL   RBC 2.43 (L) 3.87 - 5.11 MIL/uL   Hemoglobin 7.5 (L) 12.0 - 15.0 g/dL   HCT 76.4 (L) 63.9 - 53.9 %   MCV 96.7 80.0 - 100.0 fL   MCH 30.9 26.0 - 34.0 pg   MCHC 31.9 30.0 - 36.0 g/dL   RDW 83.9 (H) 88.4 - 84.4 %   Platelets 249 150 - 400 K/uL   nRBC 0.0 0.0 - 0.2 %    Comment: Performed at [facility], [address] [place], [place] [address]  Urinalysis, w/ Reflex to Culture (Infection Suspected) -Urine, Clean Catch     Status: Abnormal   Collection Time: 01/21/24  8:30 AM  Result Value Ref Range   Specimen Source URINE, CLEAN CATCH    Color, Urine YELLOW (A) YELLOW   APPearance TURBID (A) CLEAR   Specific Gravity, Urine 1.009 1.005 - 1.030   pH 5.0 5.0 - 8.0   Glucose, UA >=500 (A) NEGATIVE mg/dL   Hgb urine dipstick SMALL (A) NEGATIVE   Bilirubin Urine NEGATIVE NEGATIVE   Ketones, ur NEGATIVE NEGATIVE mg/dL   Protein, ur 899 (A) NEGATIVE mg/dL   Nitrite NEGATIVE NEGATIVE   Leukocytes,Ua LARGE (A) NEGATIVE   RBC / HPF >50 0 - 5 RBC/hpf   WBC, UA >50 0 - 5 WBC/hpf    Comment:        Reflex urine culture not performed if WBC <=10, OR if Squamous epithelial cells >5. If Squamous epithelial cells >5 suggest recollection.    Bacteria, UA FEW (A) NONE SEEN   Squamous Epithelial / HPF 6-10 0 - 5 /HPF   WBC Clumps PRESENT    Budding Yeast PRESENT     Comment: Performed at [facility], [address] [place], [place] [address]  Urine Drug Screen     Status: Abnormal   Collection Time: 01/21/24  8:30 AM  Result Value Ref Range   Opiates POSITIVE (A) NEGATIVE   Cocaine NEGATIVE NEGATIVE   Benzodiazepines NEGATIVE NEGATIVE   Amphetamines NEGATIVE NEGATIVE   Tetrahydrocannabinol NEGATIVE NEGATIVE   Barbiturates  NEGATIVE NEGATIVE   Methadone Scn, Ur NEGATIVE NEGATIVE   Fentanyl  NEGATIVE NEGATIVE    Comment: (NOTE) Drug screen is for Medical  Purposes only. Positive results are preliminary only. If confirmation is needed, notify lab within 5 days.  Drug Class                 Cutoff (ng/mL) Amphetamine and metabolites 1000 Barbiturate and metabolites 200 Benzodiazepine              200 Opiates and metabolites     300 Cocaine and metabolites     300 THC                         50 Fentanyl                     5 Methadone                   300  Trazodone  is metabolized in vivo to several metabolites,  including pharmacologically active m-CPP, which is excreted in the  urine.  Immunoassay screens for amphetamines and MDMA have potential  cross-reactivity with these compounds and may provide false positive  result.  Performed at [facility], [address] [place], [place] [address]   Urine Culture (for pregnant, neutropenic or urologic patients or patients with an indwelling urinary catheter)     Status: Abnormal   Collection Time: 01/21/24  8:30 AM   Specimen: Urine, Clean Catch  Result Value Ref Range   Specimen Description      URINE, CLEAN CATCH Performed at [facility], [address] [place], [place] [address]    Special Requests      NONE Performed at [facility], [address], [place], [place] [address]    Culture MULTIPLE SPECIES PRESENT, SUGGEST RECOLLECTION (A)    Report Status 01/22/2024 FINAL   Vancomycin , random     Status: None   Collection Time: 01/21/24  9:19 AM  Result Value Ref Range   Vancomycin  Rm 12 ug/mL    Comment: Performed at [facility], [address] [place], [place] [address]  ECHOCARDIOGRAM COMPLETE     Status: None   Collection Time: 01/21/24  1:44 PM  Result Value Ref Range   Weight 2,116.42 oz   Height 68 in   BP 123/60 mmHg   Ao pk vel 2.12 m/s   AV Area VTI 1.87 cm2   AR max vel 1.47 cm2   AV  Mean grad 11.0 mmHg   AV Peak grad 18.0 mmHg   S' Lateral 3.50 cm   AV Area mean vel 1.42 cm2   Area-P 1/2 4.49 cm2   MV VTI 2.11 cm2   MV M vel 5.37 m/s   MV Peak grad 115.3 mmHg   Radius 0.50 cm   Est EF 55 - 60%   Renal function panel     Status: Abnormal   Collection Time: 01/22/24  9:15 AM  Result Value Ref Range   Sodium 132 (L) 135 - 145 mmol/L   Potassium 3.5 3.5 - 5.1 mmol/L   Chloride 102 98 - 111 mmol/L   CO2 21 (L) 22 - 32 mmol/L   Glucose, Bld 143 (H) 70 - 99 mg/dL    Comment: Glucose reference range applies only to samples taken after fasting for at least 8 hours.   BUN 22 8 - 23 mg/dL   Creatinine, Ser 8.44 (H) 0.44 - 1.00 mg/dL   Calcium  7.5 (L) 8.9 - 10.3 mg/dL   Phosphorus 2.1 (L) 2.5 - 4.6 mg/dL   Albumin   2.6 (L) 3.5 - 5.0 g/dL   GFR, Estimated 37 (L) >60 mL/min    Comment: (NOTE) Calculated using the CKD-EPI Creatinine Equation (2021)    Anion gap 9 5 - 15    Comment: Performed at [facility], [address] [place], [place] [address]  Vancomycin , random     Status: None   Collection Time: 01/22/24  9:15 AM  Result Value Ref Range   Vancomycin  Rm 13 ug/mL    Comment: Performed at [facility], [address] [place], [place] [address]  MRSA Next Gen by PCR, Nasal     Status: Abnormal   Collection Time: 01/22/24 11:13 AM   Specimen: Nasal Mucosa; Nasal Swab  Result Value Ref Range   MRSA by PCR Next Gen DETECTED (A) NOT DETECTED    Comment: RESULT CALLED TO, READ BACK BY AND VERIFIED WITH: KRISTIN MORRISON 01/22/24 1255 MW (NOTE) The GeneXpert MRSA Assay (FDA approved for NASAL specimens only), is one component of a comprehensive MRSA colonization surveillance program. It is not intended to diagnose MRSA infection nor to guide or monitor treatment for MRSA infections. Test performance is not FDA approved in patients less than 15 years old. Performed at [facility], [address] [place], [place] [address]    Vitamin A      Status: Abnormal   Collection Time: 01/22/24 11:33 AM  Result Value Ref Range   Vitamin A  (Retinoic Acid) 11.5 (L) 22.0 - 69.5 ug/dL    Comment: (NOTE) Reference intervals for vitamin A  determined from [facility] internal studies. Individuals with vitamin A  less than 20 ug/dL are considered vitamin A  deficient and those with serum concentrations less than 10 ug/dL are considered severely deficient. This test was developed and its performance characteristics determined by [facility]. It has not been cleared or approved by the Food and Drug Administration. Performed At: [facility] [address] [place], [place] [id number] Jennette Shorter MD Ey:[id number]   C-reactive protein     Status: Abnormal   Collection Time: 01/22/24 11:33 AM  Result Value Ref Range   CRP 2.9 (H) <1.0 mg/dL    Comment: Performed at [facility], [address] [place], [place] [address]  Basic metabolic panel with GFR     Status: Abnormal   Collection Time: 01/24/24  6:33 AM  Result Value Ref Range   Sodium 136 135 - 145 mmol/L   Potassium 3.2 (L) 3.5 - 5.1 mmol/L   Chloride 102 98 - 111 mmol/L   CO2 24 22 - 32 mmol/L   Glucose, Bld 106 (H) 70 - 99 mg/dL    Comment: Glucose reference range applies only to samples taken after fasting for at least 8 hours.   BUN 17 8 - 23 mg/dL   Creatinine, Ser 8.68 (H) 0.44 - 1.00 mg/dL   Calcium  7.4 (L) 8.9 - 10.3 mg/dL   GFR, Estimated 46 (L) >60 mL/min    Comment: (NOTE) Calculated using the CKD-EPI Creatinine Equation (2021)    Anion gap 10 5 - 15    Comment: Performed at [facility], [address] [place], [place] [address]  CBC     Status: Abnormal   Collection Time: 01/24/24  6:33 AM  Result Value Ref Range   WBC 7.4 4.0 - 10.5 K/uL   RBC 2.91 (L) 3.87 - 5.11 MIL/uL   Hemoglobin 8.9 (L) 12.0 - 15.0 g/dL   HCT 72.9 (L) 63.9 - 53.9 %   MCV 92.8 80.0 -  100.0 fL   MCH 30.6 26.0 - 34.0 pg   MCHC 33.0 30.0 - 36.0 g/dL   RDW  84.9 88.4 - 84.4 %   Platelets 213 150 - 400 K/uL   nRBC 0.0 0.0 - 0.2 %    Comment: Performed at [facility], [address] [place], [place] [address]  Basic metabolic panel with GFR     Status: Abnormal   Collection Time: 01/25/24  6:37 AM  Result Value Ref Range   Sodium 136 135 - 145 mmol/L   Potassium 4.2 3.5 - 5.1 mmol/L   Chloride 106 98 - 111 mmol/L   CO2 21 (L) 22 - 32 mmol/L   Glucose, Bld 118 (H) 70 - 99 mg/dL    Comment: Glucose reference range applies only to samples taken after fasting for at least 8 hours.   BUN 12 8 - 23 mg/dL   Creatinine, Ser 8.86 (H) 0.44 - 1.00 mg/dL   Calcium  7.5 (L) 8.9 - 10.3 mg/dL   GFR, Estimated 54 (L) >60 mL/min    Comment: (NOTE) Calculated using the CKD-EPI Creatinine Equation (2021)    Anion gap 9 5 - 15    Comment: Performed at [facility], [address] [place], [place] [address]  Magnesium      Status: None   Collection Time: 01/25/24  6:37 AM  Result Value Ref Range   Magnesium  1.9 1.7 - 2.4 mg/dL    Comment: Performed at [facility], [address], [place], [place] [address]  Phosphorus     Status: None   Collection Time: 01/25/24  6:37 AM  Result Value Ref Range   Phosphorus 3.3 2.5 - 4.6 mg/dL    Comment: Performed at [facility], [address] [place], [place] [address]  Bladder Scan (Post Void Residual) in office     Status: None   Collection Time: 02/18/24  3:19 PM  Result Value Ref Range   Scan Result 405   Urinalysis, Complete     Status: Abnormal   Collection Time: 02/18/24  5:00 PM  Result Value Ref Range   Specific Gravity, UA 1.015 1.005 - 1.030   pH, UA 6.5 5.0 - 7.5   Color, UA Yellow Yellow   Appearance Ur Cloudy (A) Clear   Leukocytes,UA 2+ (A) Negative   Protein,UA 1+ (A) Negative/Trace   Glucose, UA Negative Negative   Ketones, UA Negative Negative   RBC, UA Trace (A) Negative   Bilirubin, UA Negative Negative   Urobilinogen, Ur 0.2 0.2 - 1.0 mg/dL    Nitrite, UA Positive (A) Negative   Microscopic Examination See below:     Comment: Microscopic was indicated and was performed.  CULTURE, URINE COMPREHENSIVE     Status: Abnormal   Collection Time: 02/18/24  5:00 PM   Specimen: Urine   Urine  Result Value Ref Range   Urine Culture, Comprehensive Final report (A)    Organism ID, Bacteria Comment (A)     Comment: Pseudomonas aeruginosa Ceftazidime-avibactam and ceftolozane-tazobactam may be considered for therapy ONLY when multi-drug resistance (MDR) is demonstrated to meropenem  and other tested agents. Greater than 100,000 colony forming units per mL    ANTIMICROBIAL SUSCEPTIBILITY Comment     Comment:       ** S = Susceptible; I = Intermediate; R = Resistant **                    P = Positive; N = Negative  MICS are expressed in micrograms per mL    Antibiotic                 RSLT#1    RSLT#2    RSLT#3    RSLT#4 Amikacin                       S Cefepime                        S Ceftazidime                    S Ceftazidime/avibactam          S Ceftolozane/tazobactam         S Ciprofloxacin                   S Levofloxacin                    S Meropenem                       S Piperacillin/Tazobactam        S Tobramycin                     S   Microscopic Examination     Status: Abnormal   Collection Time: 02/18/24  5:00 PM   Urine  Result Value Ref Range   WBC, UA >30 (A) 0 - 5 /hpf   RBC, Urine 3-10 (A) 0 - 2 /hpf   Epithelial Cells (non renal) 0-10 0 - 10 /hpf   Mucus, UA Present (A) Not Estab.   Bacteria, UA Many (A) None seen/Few    Diabetic Foot Exam:     PHQ2/9:    02/24/2024    1:58 PM 05/01/2023    2:45 PM 03/07/2023   11:35 AM 11/01/2022    9:54 AM 09/18/2022    1:07 PM  Depression screen PHQ 2/9  Decreased Interest 3 3 2 3 1   Down, Depressed, Hopeless 3 3 2 3 3   PHQ - 2 Score 6 6 4 6 4   Altered sleeping 3 3 0 3 3  Tired, decreased energy 3 3 2 3 3   Change in appetite 0 0 0 2 3  Feeling bad or  failure about yourself  0 1 0 3 3  Trouble concentrating 3 0 0 3 3  Moving slowly or fidgety/restless 0 0 0 0 0  Suicidal thoughts 0 0 0 0 0  PHQ-9 Score 15 13  6  20  19    Difficult doing work/chores Very difficult Very difficult Not difficult at all       Data saved with a previous flowsheet row definition    phq 9 is positive  Fall Risk:    02/24/2024    1:56 PM 03/07/2023   11:38 AM 11/01/2022    9:54 AM 09/18/2022    1:07 PM 05/09/2022    2:59 PM  Fall Risk   Falls in the past year? 1 1 1 1 1   Number falls in past yr: 0 1 1 1  0  Injury with Fall? 0 0  1  1  0   Risk for fall due to :  History of fall(s);Impaired balance/gait No Fall Risks No Fall Risks No Fall Risks  Follow up Falls evaluation completed Falls prevention discussed;Falls evaluation completed Falls prevention discussed Falls prevention discussed Falls prevention discussed  Data saved with a previous flowsheet row definition     Assessment & Plan Frequent falls Recurrent falls likely due to Parkinson's disease, chronic pain, and possible hypoxemia. Discussed potential need for motorized wheelchair, bed rails, and nursing home placement for safety and quality of life. - Consider motorized wheelchair for improved mobility and safety. - Consider bed rails to prevent rolling out of bed. - Continue home health services for physical therapy and wound care.  Traumatic open wound of right lower leg Wound due to fall, improving with home health wound care. - Continue home health services for wound care twice a week.  Chronic respiratory failure with hypoxia Chronic respiratory failure with hypoxia, requiring oxygen  use at night and sometimes during the day. Possible contribution to falls and balance issues. - Continue oxygen  therapy as needed.  Parkinson's disease Contributing to balance issues and falls. Scheduled to see neurologist and neurosurgeon for further evaluation and management. - Continue follow up with  neurologist and neurosurgeon for further evaluation and management.  Chronic pain syndrome Affecting back, knees, arms, contributing to decreased mobility and quality of life. Pain management is a priority.  Contusion of right knee Contusion from recent fall.        [1]  Current Outpatient Medications:    acetaminophen  (TYLENOL ) 650 MG suppository, Place 1 suppository (650 mg total) rectally every 4 (four) hours as needed for fever (> 101.5)., Disp: , Rfl:    ascorbic acid  (VITAMIN C ) 500 MG tablet, Take 1 tablet (500 mg total) by mouth 2 (two) times daily., Disp: , Rfl:    calcium  carbonate (TUMS - DOSED IN MG ELEMENTAL CALCIUM ) 500 MG chewable tablet, Chew 2 tablets (400 mg of elemental calcium  total) by mouth 3 (three) times daily as needed for indigestion or heartburn., Disp: , Rfl:    carbidopa -levodopa  (SINEMET  CR) 50-200 MG tablet, Take 1 tablet by mouth at bedtime., Disp: 30 tablet, Rfl: 0   carbidopa -levodopa  (SINEMET  IR) 10-100 MG tablet, Take 1 tablet by mouth 3 (three) times daily with meals., Disp: 90 tablet, Rfl: 0   colchicine  0.6 MG tablet, Take 1 tablet (0.6 mg total) by mouth 2 (two) times daily as needed for up to 3 days (gout flare)., Disp: , Rfl:    collagenase  (SANTYL ) 250 UNIT/GM ointment, Apply topically daily., Disp: 30 g, Rfl: 2   cyclobenzaprine  (FLEXERIL ) 10 MG tablet, Take 10 mg by mouth 3 (three) times daily., Disp: , Rfl:    DULoxetine  (CYMBALTA ) 60 MG capsule, Take 1 capsule (60 mg total) by mouth daily., Disp: 90 capsule, Rfl: 1   furosemide  (LASIX ) 40 MG tablet, Take 40 mg by mouth daily., Disp: , Rfl:    gabapentin  (NEURONTIN ) 600 MG tablet, Take 600 mg by mouth 2 (two) times daily., Disp: , Rfl:    hydrOXYzine  (ATARAX ) 25 MG tablet, Take 1 tablet (25 mg total) by mouth 2 (two) times daily as needed. Home med., Disp: , Rfl:    ipratropium-albuterol  (DUONEB) 0.5-2.5 (3) MG/3ML SOLN, Inhale 3 mLs into the lungs every 6 (six) hours as needed., Disp: 360 mL,  Rfl: 3   lidocaine  (LIDODERM ) 5 %, Place 1 patch onto the skin daily. Remove & Discard patch within 12 hours or as directed by MD, Disp: 30 patch, Rfl: 0   lipase/protease/amylase (CREON ) 12000-38000 units CPEP capsule, Take 1 capsule (12,000 Units total) by mouth 3 (three) times daily before meals., Disp: 270 capsule, Rfl: 0   montelukast  (SINGULAIR ) 10 MG tablet, Take 10 mg by mouth  daily., Disp: , Rfl:    morphine  (MSIR) 15 MG tablet, Take 15 mg by mouth 3 (three) times daily as needed., Disp: , Rfl:    Multiple Vitamin (MULTIVITAMIN WITH MINERALS) TABS tablet, Take 1 tablet by mouth daily., Disp: , Rfl:    omeprazole  (PRILOSEC) 40 MG capsule, Take 1 capsule (40 mg total) by mouth daily., Disp: 90 capsule, Rfl: 0   ondansetron  (ZOFRAN -ODT) 8 MG disintegrating tablet, Take by mouth., Disp: , Rfl:    predniSONE  (DELTASONE ) 10 MG tablet, Take 10 mg by mouth daily., Disp: , Rfl:    QUEtiapine  (SEROQUEL ) 25 MG tablet, Take 1 tablet (25 mg total) by mouth at bedtime., Disp: 90 tablet, Rfl: 1   rosuvastatin  (CRESTOR ) 5 MG tablet, Take 1 tablet (5 mg total) by mouth at bedtime., Disp: 90 tablet, Rfl: 1   SYMBICORT  160-4.5 MCG/ACT inhaler, INHALE 2 PUFFS TWICE A DAY RINSE MOUTH WITH WATER  AFTER EACH USE, Disp: 10.2 g, Rfl: 2   theophylline  (UNIPHYL) 400 MG 24 hr tablet, Take 400 mg by mouth daily., Disp: , Rfl:    tiotropium (SPIRIVA ) 18 MCG inhalation capsule, Place 1 capsule into inhaler and inhale daily. pm, Disp: , Rfl:    VENTOLIN  HFA 108 (90 Base) MCG/ACT inhaler, INHALE 1 PUFF BY MOUTH AS NEEDED, Disp: 18 g, Rfl: 0   Vitamin D , Ergocalciferol , (DRISDOL ) 1.25 MG (50000 UNIT) CAPS capsule, Take 1 capsule (50,000 Units total) by mouth every 7 (seven) days., Disp: 5 capsule, Rfl: 0   zinc  sulfate, 50mg  elemental zinc , 220 (50 Zn) MG capsule, Take 1 capsule (220 mg total) by mouth daily., Disp: , Rfl:  [2]  Allergies Allergen Reactions   Augmentin  [Amoxicillin -Pot Clavulanate] Diarrhea   Penicillins  Itching

## 2024-02-27 ENCOUNTER — Other Ambulatory Visit: Payer: Self-pay

## 2024-02-27 DIAGNOSIS — G20A1 Parkinson's disease without dyskinesia, without mention of fluctuations: Secondary | ICD-10-CM

## 2024-02-27 DIAGNOSIS — R296 Repeated falls: Secondary | ICD-10-CM

## 2024-02-28 ENCOUNTER — Ambulatory Visit: Payer: Self-pay | Admitting: Physician Assistant

## 2024-02-28 DIAGNOSIS — Z466 Encounter for fitting and adjustment of urinary device: Secondary | ICD-10-CM

## 2024-02-28 DIAGNOSIS — R339 Retention of urine, unspecified: Secondary | ICD-10-CM

## 2024-02-29 ENCOUNTER — Other Ambulatory Visit: Payer: Self-pay | Admitting: Family Medicine

## 2024-03-02 MED ORDER — CIPROFLOXACIN HCL 250 MG PO TABS: 250.0000 mg | ORAL_TABLET | Freq: Two times a day (BID) | ORAL | 0 refills | Status: AC

## 2024-03-02 NOTE — Telephone Encounter (Signed)
 Requested medication (s) are due for refill today: yes  Requested medication (s) are on the active medication list: yes  Last refill:  09/27/23  Future visit scheduled: yes  Notes to clinic:  Unable to refill per protocol, last refill by another provider.      Requested Prescriptions  Pending Prescriptions Disp Refills   colchicine  0.6 MG tablet [Pharmacy Med Name: COLCHICINE  0.6 MG TAB] 90 tablet     Sig: TAKE 1 TABLET BY MOUTH ONCE DAILY     Endocrinology:  Gout Agents - colchicine  Failed - 03/02/2024  4:19 PM      Failed - Cr in normal range and within 360 days    Creat  Date Value Ref Range Status  11/01/2022 1.26 (H) 0.50 - 1.05 mg/dL Final   Creatinine, Ser  Date Value Ref Range Status  01/25/2024 1.13 (H) 0.44 - 1.00 mg/dL Final   Creatinine, Urine  Date Value Ref Range Status  11/01/2022 52 20 - 275 mg/dL Final         Failed - AST in normal range and within 360 days    AST  Date Value Ref Range Status  01/20/2024 60 (H) 15 - 41 U/L Final   SGOT(AST)  Date Value Ref Range Status  10/07/2012 16 15 - 37 Unit/L Final         Passed - ALT in normal range and within 360 days    ALT  Date Value Ref Range Status  01/20/2024 16 0 - 44 U/L Final   SGPT (ALT)  Date Value Ref Range Status  10/07/2012 14 12 - 78 U/L Final         Passed - Valid encounter within last 12 months    Recent Outpatient Visits           1 week ago History of amputation of left second toe   [place] [facility] Glenard Mire, MD   10 months ago Type 2 diabetes mellitus with peripheral neuropathy [facility])   [place] [facility] Glenard Mire, MD       Future Appointments             In 1 week Maurine Lukes, PA-C [facility]   In 1 week Maurine Lukes, [place] [facility] [place]            Passed - CBC within normal limits and completed in the last 12 months    WBC  Date Value Ref Range Status   01/24/2024 7.4 4.0 - 10.5 K/uL Final   RBC  Date Value Ref Range Status  01/24/2024 2.91 (L) 3.87 - 5.11 MIL/uL Final   Hemoglobin  Date Value Ref Range Status  01/24/2024 8.9 (L) 12.0 - 15.0 g/dL Final   HGB  Date Value Ref Range Status  11/16/2013 13.7 12.0 - 16.0 g/dL Final   HCT  Date Value Ref Range Status  01/24/2024 27.0 (L) 36.0 - 46.0 % Final  11/16/2013 40.9 35.0 - 47.0 % Final   MCHC  Date Value Ref Range Status  01/24/2024 33.0 30.0 - 36.0 g/dL Final   [facility]  Date Value Ref Range Status  01/24/2024 30.6 26.0 - 34.0 pg Final   MCV  Date Value Ref Range Status  01/24/2024 92.8 80.0 - 100.0 fL Final  11/16/2013 94 80 - 100 fL Final   No results found for: PLTCOUNTKUC, LABPLAT, POCPLA RDW  Date Value Ref Range Status  01/24/2024 15.0 11.5 - 15.5 %  Final  11/16/2013 14.0 11.5 - 14.5 % Final

## 2024-03-02 NOTE — Telephone Encounter (Signed)
 Spoke with patient and informed her Per Sam, she would like to start her on an antibiotic by the name of Cipro  250 mg 2 times daily x 5 days in advance of upcoming voiding trial. Correct pharmacy confirmed. Appointment reminder provided for 01/20 at 9am and 3:20pm. Patient verbalized understanding.

## 2024-03-04 ENCOUNTER — Encounter: Payer: Self-pay | Admitting: Neurology

## 2024-03-04 ENCOUNTER — Other Ambulatory Visit: Payer: Self-pay

## 2024-03-04 ENCOUNTER — Telehealth: Payer: Self-pay

## 2024-03-04 DIAGNOSIS — R202 Paresthesia of skin: Secondary | ICD-10-CM

## 2024-03-04 NOTE — Telephone Encounter (Signed)
 Copied from CRM [id number]. Topic: Clinical - Home Health Verbal Orders >> Mar 04, 2024  1:55 PM Delon DASEN wrote: Caller/Agency: Therisa with [facility] Callback Number: [phone] secure line Service Requested: Skilled Nursing Frequency: n/a Any new concerns about the patient? Yes- wounds are not healing and may need to be debrided and recommending specialist for wound care- edema bilateral legs with weeping

## 2024-03-04 NOTE — Telephone Encounter (Signed)
 VO given.

## 2024-03-10 ENCOUNTER — Ambulatory Visit (INDEPENDENT_AMBULATORY_CARE_PROVIDER_SITE_OTHER): Admitting: Physician Assistant

## 2024-03-10 ENCOUNTER — Encounter: Payer: Self-pay | Admitting: Physician Assistant

## 2024-03-10 ENCOUNTER — Ambulatory Visit: Admitting: Physician Assistant

## 2024-03-10 VITALS — BP 144/70 | HR 105 | Ht 68.0 in | Wt 144.0 lb

## 2024-03-10 DIAGNOSIS — R339 Retention of urine, unspecified: Secondary | ICD-10-CM

## 2024-03-10 LAB — BLADDER SCAN AMB NON-IMAGING

## 2024-03-10 NOTE — Progress Notes (Unsigned)
 "    03/11/2024 7:27 PM   Tricia [name] [birth year] [id number]  Referring provider: Glenard Mire, MD [address] [place],  [place] [address]  Urological history: 1. Urinary retention - first occurrence 2023 - second occurrence 2025  No chief complaint on file.  HPI: Tricia [name] is a 64 y.o. woman who presents today for repeat PVR.  Previous records reviewed.  Patient went into retention again after being hospitalized for sepsis and had Foley catheter removed yesterday for a voiding trial.  She was able to void 3 times yesterday and felt comfortable, but the bladder scan noted a PVR greater than 400.         PMH: Past Medical History:  Diagnosis Date   Anxiety    Arthritis    joints and hands/ knees   Asthma    uses inhaler   Benign essential tremor    head   Cervical dystonia    neck pain   Cholesteatoma of left ear    x2   COPD (chronic obstructive pulmonary disease) (HCC)    Cough    Depression    Diabetes mellitus without complication (HCC)    type 2   Diastolic dysfunction    Dyspnea    Dysrhythmia    diastolic dysfunction   GERD (gastroesophageal reflux disease)    Headache    migraines/ one per week   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   Hyperlipidemia    Hypertension    Motion sickness    boat   Neuromuscular disorder (HCC)    neuropathy feet and hands( nerve damage)   Wears dentures    upper and lower    Surgical History: Past Surgical History:  Procedure Laterality Date   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   Procedure: AMPUTATION, TOE;  Surgeon: Lennie Barter, DPM;  Location: [facility];  Service: Orthopedics/Podiatry;  Laterality: Left;  2nd Toe amputation   CARPAL TUNNEL RELEASE Bilateral    x2 right, 1x on left   COLONOSCOPY     COLONOSCOPY WITH PROPOFOL  N/A 04/25/2017   Procedure: COLONOSCOPY WITH PROPOFOL ;  Surgeon: Jinny Carmine, MD;  Location: [facility];  Service: Endoscopy;   Laterality: N/A;  diabetic-oral med   DILATION AND CURETTAGE OF UTERUS     ESOPHAGOGASTRODUODENOSCOPY (EGD) WITH PROPOFOL  N/A 01/10/2021   Procedure: ESOPHAGOGASTRODUODENOSCOPY (EGD) WITH PROPOFOL ;  Surgeon: Janalyn Keene NOVAK, MD;  Location: [facility];  Service: Endoscopy;  Laterality: N/A;  Diabetic   EXTERNAL EAR SURGERY Left    x2   IRRIGATION AND DEBRIDEMENT FOOT Right 11/14/2023   Procedure: IRRIGATION AND DEBRIDEMENT FOOT;  Surgeon: Lennie Barter, DPM;  Location: [facility];  Service: Orthopedics/Podiatry;  Laterality: Right;   POLYPECTOMY  04/25/2017   Procedure: POLYPECTOMY INTESTINAL;  Surgeon: Jinny Carmine, MD;  Location: [facility];  Service: Endoscopy;;   SPINE SURGERY     herniated disc   TUBAL LIGATION     WOUND DEBRIDEMENT Bilateral 11/14/2023   Procedure: DEBRIDEMENT, WOUND;  Surgeon: Lennie Barter, DPM;  Location: [facility];  Service: Orthopedics/Podiatry;  Laterality: Bilateral;    Home Medications:  Allergies as of 03/11/2024       Reactions   Augmentin  [amoxicillin -pot Clavulanate] Diarrhea   Penicillins Itching        Medication List        Accurate as of March 10, 2024  7:27 PM. If you have any questions, ask your nurse or doctor.  carbidopa -levodopa  25-100 MG tablet Commonly known as: SINEMET  IR Take 1.5 tablets by mouth. The timing of this medication is very important.   acetaminophen  650 MG suppository Commonly known as: TYLENOL  Place 1 suppository (650 mg total) rectally every 4 (four) hours as needed for fever (> 101.5).   ascorbic acid  500 MG tablet Commonly known as: VITAMIN C  Take 1 tablet (500 mg total) by mouth 2 (two) times daily.   calcium  carbonate 500 MG chewable tablet Commonly known as: TUMS - dosed in mg elemental calcium  Chew 2 tablets (400 mg of elemental calcium  total) by mouth 3 (three) times daily as needed for indigestion or heartburn.   colchicine  0.6 MG tablet Take 1 tablet (0.6 mg total) by  mouth daily as needed (at onset of symptoms may repeat a second dose in 2 hours).   collagenase  250 UNIT/GM ointment Commonly known as: SANTYL  Apply topically daily.   cyclobenzaprine  10 MG tablet Commonly known as: FLEXERIL  Take 10 mg by mouth 3 (three) times daily.   DULoxetine  60 MG capsule Commonly known as: CYMBALTA  Take 1 capsule (60 mg total) by mouth daily.   furosemide  40 MG tablet Commonly known as: LASIX  Take 40 mg by mouth daily.   gabapentin  600 MG tablet Commonly known as: NEURONTIN  Take 600 mg by mouth 2 (two) times daily.   hydrOXYzine  25 MG tablet Commonly known as: ATARAX  Take 1 tablet (25 mg total) by mouth 2 (two) times daily as needed. Home med.   ipratropium-albuterol  0.5-2.5 (3) MG/3ML Soln Commonly known as: DuoNeb Inhale 3 mLs into the lungs every 6 (six) hours as needed.   lidocaine  5 % Commonly known as: LIDODERM  Place 1 patch onto the skin daily. Remove & Discard patch within 12 hours or as directed by MD   lipase/protease/amylase 12000-38000 units Cpep capsule Commonly known as: CREON  Take 1 capsule (12,000 Units total) by mouth 3 (three) times daily before meals.   montelukast  10 MG tablet Commonly known as: SINGULAIR  Take 10 mg by mouth daily.   morphine  15 MG tablet Commonly known as: MSIR Take 15 mg by mouth 3 (three) times daily as needed.   multivitamin with minerals Tabs tablet Take 1 tablet by mouth daily.   omeprazole  40 MG capsule Commonly known as: PRILOSEC Take 1 capsule (40 mg total) by mouth daily.   ondansetron  8 MG disintegrating tablet Commonly known as: ZOFRAN -ODT Take by mouth.   predniSONE  10 MG tablet Commonly known as: DELTASONE  Take 10 mg by mouth daily.   QUEtiapine  25 MG tablet Commonly known as: SEROQUEL  Take 1 tablet (25 mg total) by mouth at bedtime.   rosuvastatin  5 MG tablet Commonly known as: CRESTOR  Take 1 tablet (5 mg total) by mouth at bedtime.   Symbicort  160-4.5 MCG/ACT  inhaler Generic drug: budesonide -formoterol  INHALE 2 PUFFS TWICE A DAY RINSE MOUTH WITH WATER  AFTER EACH USE   theophylline  400 MG 24 hr tablet Commonly known as: UNIPHYL Take 400 mg by mouth daily.   tiotropium 18 MCG inhalation capsule Commonly known as: SPIRIVA  Place 1 capsule into inhaler and inhale daily. pm   Ventolin  HFA 108 (90 Base) MCG/ACT inhaler Generic drug: albuterol  INHALE 1 PUFF BY MOUTH AS NEEDED   Vitamin D  (Ergocalciferol ) 1.25 MG (50000 UNIT) Caps capsule Commonly known as: DRISDOL  Take 1 capsule (50,000 Units total) by mouth every 7 (seven) days.   zinc  sulfate (50mg  elemental zinc ) 220 (50 Zn) MG capsule Take 1 capsule (220 mg total) by mouth daily.  Allergies: Allergies[1]  Family History: Family History  Problem Relation Age of Onset   Emphysema Mother    Anxiety disorder Mother    Stroke Father    Throat cancer Father    Lung cancer Maternal Grandmother    Lung cancer Maternal Grandfather    Hypertension Daughter    Diabetes Daughter    Multiple sclerosis Daughter    Bipolar disorder Daughter    Cervical cancer Daughter    Bipolar disorder Daughter    Drug abuse Daughter    Lung cancer Maternal Aunt    Lung cancer Maternal Uncle     Social History:  reports that she has been smoking cigarettes. She started smoking about 46 years ago. She has a 46.8 pack-year smoking history. She has never used smokeless tobacco. She reports that she does not drink alcohol and does not use drugs.  ROS: Pertinent ROS in HPI  Physical Exam: There were no vitals taken for this visit.  Constitutional:  Well nourished. Alert and oriented, No acute distress. HEENT: [place] AT, moist mucus membranes.  Trachea midline, no masses. Cardiovascular: No clubbing, cyanosis, or edema. Respiratory: Normal respiratory effort, no increased work of breathing. GU: No CVA tenderness.  No bladder fullness or masses.  Recession of labia minora, dry, pale vulvar vaginal  mucosa and loss of mucosal ridges and folds.  Normal urethral meatus, no lesions, no prolapse, no discharge.   No urethral masses, tenderness and/or tenderness. No bladder fullness, tenderness or masses. *** vagina mucosa, *** estrogen effect, no discharge, no lesions, *** pelvic support, *** cystocele and *** rectocele noted.  No cervical motion tenderness.  Uterus is freely mobile and non-fixed.  No adnexal/parametria masses or tenderness noted.  Anus and perineum are without rashes or lesions.   ***  Neurologic: Grossly intact, no focal deficits, moving all 4 extremities. Psychiatric: Normal mood and affect.    Laboratory Data: See Epic and HPI   I have reviewed the labs.   Pertinent Imaging: ***  Assessment & Plan:  ***  1. Urinary retention - ***  No follow-ups on file.  These notes generated with voice recognition software. I apologize for typographical errors.  CLOTILDA CORNWALL, PA-C  [facility] [address]  [address] [place], [place] [address] [phone]     [1]  Allergies Allergen Reactions   Augmentin  [Amoxicillin -Pot Clavulanate] Diarrhea   Penicillins Itching   "

## 2024-03-10 NOTE — Progress Notes (Signed)
 Catheter Removal  Patient is present today for a catheter removal.  10ml of water  was drained from the balloon. A 16FR foley cath was removed from the bladder, no complications were noted. Patient tolerated well.  Performed by: Myrtha Rubinstein, CMA  Follow up/ Additional notes: Patient returns back to clinic this afternoon.

## 2024-03-10 NOTE — Progress Notes (Signed)
 "  03/10/2024 2:08 PM   Tricia [name] 26-Dec-1960 [id number]  CC: Chief Complaint  Patient presents with   Urinary Retention    Am VT   HPI: Tricia [name] is a 64 y.o. female with PMH diabetes, CKD, COPD, Parkinson's, cervical stenosis, and recurrent urinary retention most recently in the setting of hospitalization with sepsis due to chronic wound cellulitis who presents today for repeat voiding trial after failing a voiding trial with me on 02/18/2024.  Foley removed in the morning; see separate note.  She returned in the afternoon. She reports she has voided 3 times and is comfortable.  She denies dysuria.  PVR .  PMH: Past Medical History:  Diagnosis Date   Anxiety    Arthritis    joints and hands/ knees   Asthma    uses inhaler   Benign essential tremor    head   Cervical dystonia    neck pain   Cholesteatoma of left ear    x2   COPD (chronic obstructive pulmonary disease) (HCC)    Cough    Depression    Diabetes mellitus without complication (HCC)    type 2   Diastolic dysfunction    Dyspnea    Dysrhythmia    diastolic dysfunction   GERD (gastroesophageal reflux disease)    Headache    migraines/ one per week   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   Hyperlipidemia    Hypertension    Motion sickness    boat   Neuromuscular disorder (HCC)    neuropathy feet and hands( nerve damage)   Wears dentures    upper and lower    Surgical History: Past Surgical History:  Procedure Laterality Date   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   Procedure: AMPUTATION, TOE;  Surgeon: Lennie Barter, DPM;  Location: [facility];  Service: Orthopedics/Podiatry;  Laterality: Left;  2nd Toe amputation   CARPAL TUNNEL RELEASE Bilateral    x2 right, 1x on left   COLONOSCOPY     COLONOSCOPY WITH PROPOFOL  N/A 04/25/2017   Procedure: COLONOSCOPY WITH PROPOFOL ;  Surgeon: Jinny Carmine, MD;  Location: [facility];  Service: Endoscopy;  Laterality:  N/A;  diabetic-oral med   DILATION AND CURETTAGE OF UTERUS     ESOPHAGOGASTRODUODENOSCOPY (EGD) WITH PROPOFOL  N/A 01/10/2021   Procedure: ESOPHAGOGASTRODUODENOSCOPY (EGD) WITH PROPOFOL ;  Surgeon: Janalyn Keene NOVAK, MD;  Location: [facility];  Service: Endoscopy;  Laterality: N/A;  Diabetic   EXTERNAL EAR SURGERY Left    x2   IRRIGATION AND DEBRIDEMENT FOOT Right 11/14/2023   Procedure: IRRIGATION AND DEBRIDEMENT FOOT;  Surgeon: Lennie Barter, DPM;  Location: [facility];  Service: Orthopedics/Podiatry;  Laterality: Right;   POLYPECTOMY  04/25/2017   Procedure: POLYPECTOMY INTESTINAL;  Surgeon: Jinny Carmine, MD;  Location: [facility];  Service: Endoscopy;;   SPINE SURGERY     herniated disc   TUBAL LIGATION     WOUND DEBRIDEMENT Bilateral 11/14/2023   Procedure: DEBRIDEMENT, WOUND;  Surgeon: Lennie Barter, DPM;  Location: [facility];  Service: Orthopedics/Podiatry;  Laterality: Bilateral;    Home Medications:  Allergies as of 03/10/2024       Reactions   Augmentin  [amoxicillin -pot Clavulanate] Diarrhea   Penicillins Itching        Medication List        Accurate as of March 10, 2024  2:08 PM. If you have any questions, ask your nurse or doctor.  carbidopa -levodopa  25-100 MG tablet Commonly known as: SINEMET  IR Take 1.5 tablets by mouth. The timing of this medication is very important.   acetaminophen  650 MG suppository Commonly known as: TYLENOL  Place 1 suppository (650 mg total) rectally every 4 (four) hours as needed for fever (> 101.5).   ascorbic acid  500 MG tablet Commonly known as: VITAMIN C  Take 1 tablet (500 mg total) by mouth 2 (two) times daily.   calcium  carbonate 500 MG chewable tablet Commonly known as: TUMS - dosed in mg elemental calcium  Chew 2 tablets (400 mg of elemental calcium  total) by mouth 3 (three) times daily as needed for indigestion or heartburn.   colchicine  0.6 MG tablet Take 1 tablet (0.6 mg total) by mouth daily  as needed (at onset of symptoms may repeat a second dose in 2 hours).   collagenase  250 UNIT/GM ointment Commonly known as: SANTYL  Apply topically daily.   cyclobenzaprine  10 MG tablet Commonly known as: FLEXERIL  Take 10 mg by mouth 3 (three) times daily.   DULoxetine  60 MG capsule Commonly known as: CYMBALTA  Take 1 capsule (60 mg total) by mouth daily.   furosemide  40 MG tablet Commonly known as: LASIX  Take 40 mg by mouth daily.   gabapentin  600 MG tablet Commonly known as: NEURONTIN  Take 600 mg by mouth 2 (two) times daily.   hydrOXYzine  25 MG tablet Commonly known as: ATARAX  Take 1 tablet (25 mg total) by mouth 2 (two) times daily as needed. Home med.   ipratropium-albuterol  0.5-2.5 (3) MG/3ML Soln Commonly known as: DuoNeb Inhale 3 mLs into the lungs every 6 (six) hours as needed.   lidocaine  5 % Commonly known as: LIDODERM  Place 1 patch onto the skin daily. Remove & Discard patch within 12 hours or as directed by MD   lipase/protease/amylase 12000-38000 units Cpep capsule Commonly known as: CREON  Take 1 capsule (12,000 Units total) by mouth 3 (three) times daily before meals.   montelukast  10 MG tablet Commonly known as: SINGULAIR  Take 10 mg by mouth daily.   morphine  15 MG tablet Commonly known as: MSIR Take 15 mg by mouth 3 (three) times daily as needed.   multivitamin with minerals Tabs tablet Take 1 tablet by mouth daily.   omeprazole  40 MG capsule Commonly known as: PRILOSEC Take 1 capsule (40 mg total) by mouth daily.   ondansetron  8 MG disintegrating tablet Commonly known as: ZOFRAN -ODT Take by mouth.   predniSONE  10 MG tablet Commonly known as: DELTASONE  Take 10 mg by mouth daily.   QUEtiapine  25 MG tablet Commonly known as: SEROQUEL  Take 1 tablet (25 mg total) by mouth at bedtime.   rosuvastatin  5 MG tablet Commonly known as: CRESTOR  Take 1 tablet (5 mg total) by mouth at bedtime.   Symbicort  160-4.5 MCG/ACT inhaler Generic drug:  budesonide -formoterol  INHALE 2 PUFFS TWICE A DAY RINSE MOUTH WITH WATER  AFTER EACH USE   theophylline  400 MG 24 hr tablet Commonly known as: UNIPHYL Take 400 mg by mouth daily.   tiotropium 18 MCG inhalation capsule Commonly known as: SPIRIVA  Place 1 capsule into inhaler and inhale daily. pm   Ventolin  HFA 108 (90 Base) MCG/ACT inhaler Generic drug: albuterol  INHALE 1 PUFF BY MOUTH AS NEEDED   Vitamin D  (Ergocalciferol ) 1.25 MG (50000 UNIT) Caps capsule Commonly known as: DRISDOL  Take 1 capsule (50,000 Units total) by mouth every 7 (seven) days.   zinc  sulfate (50mg  elemental zinc ) 220 (50 Zn) MG capsule Take 1 capsule (220 mg total) by mouth daily.  Allergies:  Allergies[1]  Family History: Family History  Problem Relation Age of Onset   Emphysema Mother    Anxiety disorder Mother    Stroke Father    Throat cancer Father    Lung cancer Maternal Grandmother    Lung cancer Maternal Grandfather    Hypertension Daughter    Diabetes Daughter    Multiple sclerosis Daughter    Bipolar disorder Daughter    Cervical cancer Daughter    Bipolar disorder Daughter    Drug abuse Daughter    Lung cancer Maternal Aunt    Lung cancer Maternal Uncle     Social History:   reports that she has been smoking cigarettes. She started smoking about 46 years ago. She has a 46.8 pack-year smoking history. She has never used smokeless tobacco. She reports that she does not drink alcohol and does not use drugs.  Physical Exam: BP (!) 144/70   Pulse (!) 105   Ht 5' 8 (1.727 m)   Wt 144 lb (65.3 kg)   BMI 21.90 kg/m   Constitutional:  Alert and oriented, no acute distress, nontoxic appearing HEENT: [place], AT Cardiovascular: No clubbing, cyanosis, or edema Respiratory: Normal respiratory effort, no increased work of breathing Skin: No rashes, bruises or suspicious lesions Neurologic: Grossly intact, no focal deficits, moving all 4 extremities Psychiatric: Normal mood and  affect  Laboratory Data: Results for orders placed or performed in visit on 03/10/24  BLADDER SCAN AMB NON-IMAGING   Collection Time: 03/10/24  3:22 PM  Result Value Ref Range   Scan Result    Assessment & Plan:   1. Urinary retention (Primary) Voiding trial equivocal, though has resumed spontaneous voiding.  I offered her continued trial without catheter with repeat PVR tomorrow.  She was interested in this, but does not have gas money to get back to clinic.  I asked Crystal to arrange transportation to bring her tomorrow morning and she agreed. - BLADDER SCAN AMB NON-IMAGING   Return in 1 day (on 03/11/2024) for Repeat PVR.  Lucie Hones, PA-C  [facility] [place] [address] [place], [place] [address] [phone]       [1]  Allergies Allergen Reactions   Augmentin  [Amoxicillin -Pot Clavulanate] Diarrhea   Penicillins Itching   "

## 2024-03-11 ENCOUNTER — Ambulatory Visit: Admitting: Urology

## 2024-03-11 DIAGNOSIS — R339 Retention of urine, unspecified: Secondary | ICD-10-CM

## 2024-03-12 ENCOUNTER — Ambulatory Visit: Admitting: Physician Assistant

## 2024-03-12 ENCOUNTER — Telehealth: Payer: Self-pay

## 2024-03-12 DIAGNOSIS — E1142 Type 2 diabetes mellitus with diabetic polyneuropathy: Secondary | ICD-10-CM

## 2024-03-12 DIAGNOSIS — R238 Other skin changes: Secondary | ICD-10-CM

## 2024-03-12 MED ORDER — BLOOD GLUCOSE TEST VI STRP: 1.0000 | ORAL_STRIP | 0 refills | Status: AC

## 2024-03-12 MED ORDER — LANCET DEVICE MISC: 1.0000 | 0 refills | Status: AC

## 2024-03-12 MED ORDER — BLOOD GLUCOSE MONITORING SUPPL DEVI: 1.0000 | 0 refills | Status: AC

## 2024-03-12 MED ORDER — LANCETS MISC: 1.0000 | 0 refills | Status: AC

## 2024-03-12 NOTE — Telephone Encounter (Signed)
 Copied from CRM #[id number]. Topic: Clinical - Order For Equipment >> Mar 12, 2024 10:48 AM Robinson H wrote: Reason for CRM: Patient states she needs a new glucose testing kit and strips, states she's not sure which one her insurance will pay for.  Yamilka [phone]

## 2024-03-12 NOTE — Addendum Note (Signed)
 Addended by: RENTERIA-GARCIA, Keshonna Valvo on: 03/12/2024 12:55 PM   Modules accepted: Orders

## 2024-03-12 NOTE — Telephone Encounter (Signed)
 Please advice on wound care referral?

## 2024-03-12 NOTE — Telephone Encounter (Signed)
Spoke to patient -referral placed

## 2024-03-12 NOTE — Telephone Encounter (Unsigned)
 Copied from CRM #[id number]. Topic: General - Other >> Mar 12, 2024 10:46 AM Selinda RAMAN wrote: Reason for CRM: [facility] skilled nurse with [facility] called to report a few things. First she feels the patient would benefit from a referral to a wound care clinic because she has a slough in her leg where she has reached a standstill in the healing process. Secondly the patient had her foley catheter removed on Monday 03/09/24 from Urology. Lastly she states the patient maybe calling soon as she has been having a hard time managing her sugar as she currently does not have a working device to help check her sugar levels. Please contact Makayla with any questions at [phone] and assist patient further.

## 2024-03-12 NOTE — Telephone Encounter (Unsigned)
 Copied from CRM [id number]. Topic: Referral - Status >> Mar 12, 2024 10:49 AM Robinson H wrote: Reason for CRM: Patient states she is following up on referral to wound care specialist, agent not seeing any referral in system to wound care.  [place] [phone]

## 2024-03-19 ENCOUNTER — Ambulatory Visit: Payer: Self-pay

## 2024-03-19 DIAGNOSIS — Z0000 Encounter for general adult medical examination without abnormal findings: Secondary | ICD-10-CM

## 2024-03-19 NOTE — Telephone Encounter (Signed)
 Gave pt information to call and schedule:  [facility]  Address: [address], [place], [place] [address] Phone: [phone]  FYI

## 2024-03-19 NOTE — Patient Instructions (Addendum)
 [name],  Thank you for taking the time for your Medicare Wellness Visit. I appreciate your continued commitment to your health goals. Please review the care plan we discussed, and feel free to reach out if I can assist you further.  Please note that Annual Wellness Visits do not include a physical exam. Some assessments may be limited, especially if the visit was conducted virtually. If needed, we may recommend an in-person follow-up with your provider.  Ongoing Care Seeing your primary care provider every 3 to 6 months helps us  monitor your health and provide consistent, personalized care. 04/01/24 @ 3:00 PM APPT FOR PHYSICAL W/ DR.SOWLES  Referrals If a referral was made during today's visit and you haven't received any updates within two weeks, please contact the referred provider directly to check on the status.  Recommended Screenings:  Health Maintenance  Topic Date Due   Breast Cancer Screening  Never done   COVID-19 Vaccine (3 - Pfizer risk series) 07/07/2019   Kidney health urinalysis for diabetes  11/01/2023   Medicare Annual Wellness Visit  03/06/2024   Eye exam for diabetics  02/21/2024   Zoster (Shingles) Vaccine (1 of 2) 05/24/2024*   Hemoglobin A1C  04/28/2024   Complete foot exam   04/30/2024   Screening for Lung Cancer  11/09/2024   Yearly kidney function blood test for diabetes  01/24/2025   Colon Cancer Screening  04/26/2027   Pap with HPV screening  09/18/2027   DTaP/Tdap/Td vaccine (4 - Td or Tdap) 10/22/2033   Pneumococcal Vaccine for age over 24  Completed   Flu Shot  Completed   HPV Vaccine (No Doses Required) Completed   Hepatitis C Screening  Completed   HIV Screening  Completed   Hepatitis B Vaccine  Aged Out   Meningitis B Vaccine  Aged Out  *Topic was postponed. The date shown is not the original due date.     Vision: Annual vision screenings are recommended for early detection of glaucoma, cataracts, and diabetic retinopathy. These exams  can also reveal signs of chronic conditions such as diabetes and high blood pressure.  Dental: Annual dental screenings help detect early signs of oral cancer, gum disease, and other conditions linked to overall health, including heart disease and diabetes.  Please see the attached documents for additional preventive care recommendations.   NEXT AWV 03/25/25 @ 1:00 PM IN PERSON

## 2024-03-19 NOTE — Telephone Encounter (Signed)
 FYI

## 2024-03-19 NOTE — Progress Notes (Signed)
 "  Chief Complaint  Patient presents with   Medicare Wellness     Subjective:   Tricia [name] is a 64 y.o. female who presents for a Medicare Annual Wellness Visit.  Visit info / Clinical Intake: Medicare Wellness Visit Type:: Subsequent Annual Wellness Visit Persons participating in visit and providing information:: patient Medicare Wellness Visit Mode:: Video Since this visit was completed virtually, some vitals may be partially provided or unavailable. Missing vitals are due to the limitations of the virtual format.: Unable to obtain vitals - no equipment If Telephone or Video please confirm:: I connected with patient using audio/video enable telemedicine. I verified patient identity with two identifiers, discussed telehealth limitations, and patient agreed to proceed. Patient Location:: HOME Provider Location:: OFFICE Interpreter Needed?: No Pre-visit prep was completed: yes AWV questionnaire completed by patient prior to visit?: no Patient's Overall Health Status Rating: (!) poor Typical amount of pain: some Does pain affect daily life?: (!) yes Are you currently prescribed opioids?: (!) yes  Dietary Habits and Nutritional Risks How many meals a day?: 2 Eats fruit and vegetables daily?: (!) no Most meals are obtained by: preparing own meals In the last 2 weeks, have you had any of the following?: (!) nausea, vomiting, diarrhea (VOMITING & DIARRHEA) Diabetic:: (!) yes Any non-healing wounds?: no How often do you check your BS?: 0 Would you like to be referred to a Nutritionist or for Diabetic Management? : no  Functional Status Activities of Daily Living (to include ambulation/medication): Independent Ambulation: Independent Medication Administration: Independent Home Management (perform basic housework or laundry): Independent Manage your own finances?: yes Primary transportation is: driving Concerns about vision?: no *vision screening is required for WT[facility]*  (READERS- [facility] [facility]-YEARLY) Concerns about hearing?: no  Fall Screening Falls in the past year?: 1 Number of falls in past year: 1 Was there an injury with Fall?: 0 Fall Risk Category Calculator: 2 Patient Fall Risk Level: Moderate Fall Risk  Fall Risk Patient at Risk for Falls Due to: History of fall(s); Impaired balance/gait; Impaired mobility Fall risk Follow up: Falls evaluation completed; Falls prevention discussed  Home and Transportation Safety: All rugs have non-skid backing?: yes All stairs or steps have railings?: yes Grab bars in the bathtub or shower?: (!) no Have non-skid surface in bathtub or shower?: yes Good home lighting?: yes Regular seat belt use?: yes Hospital stays in the last year:: (!) yes How many hospital stays:: 2 Reason: SEPSIS; SEPSIS  Cognitive Assessment Difficulty concentrating, remembering, or making decisions? : yes Will 6CIT or Mini Cog be Completed: yes What year is it?: 0 points What month is it?: 0 points Give patient an address phrase to remember (5 components): [address] [place], [place] About what time is it?: 0 points Count backwards from 20 to 1: 0 points Say the months of the year in reverse: 0 points Repeat the address phrase from earlier: 0 points 6 CIT Score: 0 points  Advance Directives (For Healthcare) Does Patient Have a Medical Advance Directive?: No Would patient like information on creating a medical advance directive?: No - Patient declined  Reviewed/Updated  Reviewed/Updated: Reviewed All (Medical, Surgical, Family, Medications, Allergies, Care Teams, Patient Goals)    Allergies (verified) Augmentin  [amoxicillin -pot clavulanate] and Penicillins   Current Medications (verified) Outpatient Encounter Medications as of 03/19/2024  Medication Sig   acetaminophen  (TYLENOL ) 650 MG suppository Place 1 suppository (650 mg total) rectally every 4 (four) hours as needed for fever (> 101.5).   ascorbic acid  (VITAMIN  C) 500 MG tablet Take 1 tablet (500 mg total) by mouth 2 (two) times daily.   Blood Glucose Monitoring Suppl DEVI 1 each by Does not apply route as directed. Dispense based on patient and insurance preference. Use up to four times daily as directed. (FOR ICD-10 E10.9, E11.9).   calcium  carbonate (TUMS - DOSED IN MG ELEMENTAL CALCIUM ) 500 MG chewable tablet Chew 2 tablets (400 mg of elemental calcium  total) by mouth 3 (three) times daily as needed for indigestion or heartburn.   carbidopa -levodopa  (SINEMET  IR) 25-100 MG tablet Take 1.5 tablets by mouth.   colchicine  0.6 MG tablet Take 1 tablet (0.6 mg total) by mouth daily as needed (at onset of symptoms may repeat a second dose in 2 hours).   collagenase  (SANTYL ) 250 UNIT/GM ointment Apply topically daily.   cyclobenzaprine  (FLEXERIL ) 10 MG tablet Take 10 mg by mouth 3 (three) times daily.   DULoxetine  (CYMBALTA ) 60 MG capsule Take 1 capsule (60 mg total) by mouth daily.   furosemide  (LASIX ) 40 MG tablet Take 40 mg by mouth daily.   gabapentin  (NEURONTIN ) 600 MG tablet Take 600 mg by mouth 2 (two) times daily.   Glucose Blood (BLOOD GLUCOSE TEST STRIPS) STRP 1 each by Does not apply route as directed. Dispense based on patient and insurance preference. Use up to four times daily as directed. (FOR ICD-10 E10.9, E11.9).   hydrOXYzine  (ATARAX ) 25 MG tablet Take 1 tablet (25 mg total) by mouth 2 (two) times daily as needed. Home med.   ipratropium-albuterol  (DUONEB) 0.5-2.5 (3) MG/3ML SOLN Inhale 3 mLs into the lungs every 6 (six) hours as needed.   Lancet Device MISC 1 each by Does not apply route as directed. Dispense based on patient and insurance preference. Use up to four times daily as directed. (FOR ICD-10 E10.9, E11.9).   Lancets MISC 1 each by Does not apply route as directed. Dispense based on patient and insurance preference. Use up to four times daily as directed. (FOR ICD-10 E10.9, E11.9).   lidocaine  (LIDODERM ) 5 % Place 1 patch onto the  skin daily. Remove & Discard patch within 12 hours or as directed by MD   lipase/protease/amylase (CREON ) 12000-38000 units CPEP capsule Take 1 capsule (12,000 Units total) by mouth 3 (three) times daily before meals.   montelukast  (SINGULAIR ) 10 MG tablet Take 10 mg by mouth daily.   morphine  (MSIR) 15 MG tablet Take 15 mg by mouth 3 (three) times daily as needed.   Multiple Vitamin (MULTIVITAMIN WITH MINERALS) TABS tablet Take 1 tablet by mouth daily.   omeprazole  (PRILOSEC) 40 MG capsule Take 1 capsule (40 mg total) by mouth daily.   ondansetron  (ZOFRAN -ODT) 8 MG disintegrating tablet Take by mouth.   QUEtiapine  (SEROQUEL ) 25 MG tablet Take 1 tablet (25 mg total) by mouth at bedtime.   rosuvastatin  (CRESTOR ) 5 MG tablet Take 1 tablet (5 mg total) by mouth at bedtime.   SYMBICORT  160-4.5 MCG/ACT inhaler INHALE 2 PUFFS TWICE A DAY RINSE MOUTH WITH WATER  AFTER EACH USE   theophylline  (UNIPHYL) 400 MG 24 hr tablet Take 400 mg by mouth daily.   tiotropium (SPIRIVA ) 18 MCG inhalation capsule Place 1 capsule into inhaler and inhale daily. pm   VENTOLIN  HFA 108 (90 Base) MCG/ACT inhaler INHALE 1 PUFF BY MOUTH AS NEEDED   Vitamin D , Ergocalciferol , (DRISDOL ) 1.25 MG (50000 UNIT) CAPS capsule Take 1 capsule (50,000 Units total) by mouth every 7 (seven) days.   zinc  sulfate, 50mg  elemental zinc , 220 (50  Zn) MG capsule Take 1 capsule (220 mg total) by mouth daily.   predniSONE  (DELTASONE ) 10 MG tablet Take 10 mg by mouth daily. (Patient not taking: Reported on 03/19/2024)   No facility-administered encounter medications on file as of 03/19/2024.    History: Past Medical History:  Diagnosis Date   Anxiety    Arthritis    joints and hands/ knees   Asthma    uses inhaler   Benign essential tremor    head   Cervical dystonia    neck pain   Cholesteatoma of left ear    x2   COPD (chronic obstructive pulmonary disease) (HCC)    Cough    Depression    Diabetes mellitus without complication (HCC)     type 2   Diastolic dysfunction    Dyspnea    Dysrhythmia    diastolic dysfunction   GERD (gastroesophageal reflux disease)    Headache    migraines/ one per week   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   Hyperlipidemia    Hypertension    Motion sickness    boat   Neuromuscular disorder (HCC)    neuropathy feet and hands( nerve damage)   Wears dentures    upper and lower   Past Surgical History:  Procedure Laterality Date   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   Procedure: AMPUTATION, TOE;  Surgeon: Lennie Barter, DPM;  Location: [facility];  Service: Orthopedics/Podiatry;  Laterality: Left;  2nd Toe amputation   CARPAL TUNNEL RELEASE Bilateral    x2 right, 1x on left   COLONOSCOPY     COLONOSCOPY WITH PROPOFOL  N/A 04/25/2017   Procedure: COLONOSCOPY WITH PROPOFOL ;  Surgeon: Jinny Carmine, MD;  Location: [facility];  Service: Endoscopy;  Laterality: N/A;  diabetic-oral med   DILATION AND CURETTAGE OF UTERUS     ESOPHAGOGASTRODUODENOSCOPY (EGD) WITH PROPOFOL  N/A 01/10/2021   Procedure: ESOPHAGOGASTRODUODENOSCOPY (EGD) WITH PROPOFOL ;  Surgeon: Janalyn Keene NOVAK, MD;  Location: [facility];  Service: Endoscopy;  Laterality: N/A;  Diabetic   EXTERNAL EAR SURGERY Left    x2   IRRIGATION AND DEBRIDEMENT FOOT Right 11/14/2023   Procedure: IRRIGATION AND DEBRIDEMENT FOOT;  Surgeon: Lennie Barter, DPM;  Location: [facility];  Service: Orthopedics/Podiatry;  Laterality: Right;   POLYPECTOMY  04/25/2017   Procedure: POLYPECTOMY INTESTINAL;  Surgeon: Jinny Carmine, MD;  Location: [facility];  Service: Endoscopy;;   SPINE SURGERY     herniated disc   TUBAL LIGATION     WOUND DEBRIDEMENT Bilateral 11/14/2023   Procedure: DEBRIDEMENT, WOUND;  Surgeon: Lennie Barter, DPM;  Location: [facility];  Service: Orthopedics/Podiatry;  Laterality: Bilateral;   Family History  Problem Relation Age of Onset   Emphysema Mother    Anxiety disorder Mother    Stroke Father     Throat cancer Father    Lung cancer Maternal Grandmother    Lung cancer Maternal Grandfather    Hypertension Daughter    Diabetes Daughter    Multiple sclerosis Daughter    Bipolar disorder Daughter    Cervical cancer Daughter    Bipolar disorder Daughter    Drug abuse Daughter    Lung cancer Maternal Aunt    Lung cancer Maternal Uncle    Social History   Occupational History   Occupation: Disability  Tobacco Use   Smoking status: Every Day    Current packs/day: 1.00    Average packs/day: 1 pack/day for 46.8 years (46.8 ttl pk-yrs)  Types: Cigarettes    Start date: 05/19/1977   Smokeless tobacco: Never  Vaping Use   Vaping status: Former  Substance and Sexual Activity   Alcohol use: No    Alcohol/week: 0.0 standard drinks of alcohol   Drug use: No   Sexual activity: Not Currently    Birth control/protection: None   Tobacco Counseling Ready to quit: Not Answered Counseling given: Not Answered  SDOH Screenings   Food Insecurity: No Food Insecurity (03/19/2024)  Recent Concern: Food Insecurity - Food Insecurity Present (02/27/2024)   Received from [facility]  Housing: Unknown (03/19/2024)  Recent Concern: Housing - High Risk (01/21/2024)  Transportation Needs: No Transportation Needs (03/19/2024)  Recent Concern: Transportation Needs - Unmet Transportation Needs (02/27/2024)   Received from [facility]  Utilities: Not At Risk (03/19/2024)  Recent Concern: Utilities - At Risk (01/21/2024)  Alcohol Screen: Low Risk (03/07/2023)  Depression (PHQ2-9): Medium Risk (03/19/2024)  Financial Resource Strain: High Risk (02/27/2024)   Received from [facility]  Physical Activity: Insufficiently Active (03/19/2024)  Social Connections: Socially Isolated (03/19/2024)  Stress: No Stress Concern Present (03/19/2024)  Tobacco Use: High Risk (03/19/2024)  Health Literacy: Adequate Health Literacy (03/19/2024)   See flowsheets for full  screening details  Depression Screen PHQ 2 & 9 Depression Scale- Over the past 2 weeks, how often have you been bothered by any of the following problems? Little interest or pleasure in doing things: 2 Feeling down, depressed, or hopeless (PHQ Adolescent also includes...irritable): 2 PHQ-2 Total Score: 4 Trouble falling or staying asleep, or sleeping too much: 0 Feeling tired or having little energy: 0 Poor appetite or overeating (PHQ Adolescent also includes...weight loss): 0 Feeling bad about yourself - or that you are a failure or have let yourself or your family down: 1 Trouble concentrating on things, such as reading the newspaper or watching television (PHQ Adolescent also includes...like school work): 0 Moving or speaking so slowly that other people could have noticed. Or the opposite - being so fidgety or restless that you have been moving around a lot more than usual: 0 Thoughts that you would be better off dead, or of hurting yourself in some way: 0 PHQ-9 Total Score: 5 If you checked off any problems, how difficult have these problems made it for you to do your work, take care of things at home, or get along with other people?: Not difficult at all  Depression Treatment Depression Interventions/Treatment : EYV7-0 Score <4 Follow-up Not Indicated     Goals Addressed             This Visit's Progress    DIET - INCREASE WATER  INTAKE               Objective:    There were no vitals filed for this visit. There is no height or weight on file to calculate BMI.  Hearing/Vision screen No results found. Immunizations and Health Maintenance Health Maintenance  Topic Date Due   Mammogram  Never done   COVID-19 Vaccine (3 - Pfizer risk series) 07/07/2019   Diabetic kidney evaluation - Urine ACR  11/01/2023   OPHTHALMOLOGY EXAM  02/21/2024   Zoster Vaccines- Shingrix  (1 of 2) 05/24/2024 (Originally 09/19/1979)   HEMOGLOBIN A1C  04/28/2024   FOOT EXAM  04/30/2024   Lung  Cancer Screening  11/09/2024   Diabetic kidney evaluation - eGFR measurement  01/24/2025   Medicare Annual Wellness (AWV)  03/19/2025   Colonoscopy  04/26/2027  Cervical Cancer Screening (HPV/Pap Cotest)  09/18/2027   DTaP/Tdap/Td (4 - Td or Tdap) 10/22/2033   Pneumococcal Vaccine: 50+ Years  Completed   Influenza Vaccine  Completed   HPV VACCINES (No Doses Required) Completed   Hepatitis C Screening  Completed   HIV Screening  Completed   Hepatitis B Vaccines 19-59 Average Risk  Aged Out   Meningococcal B Vaccine  Aged Out        Assessment/Plan:  This is a routine wellness examination for Bryant.  Patient Care Team: Sowles, Krichna, MD as PCP - General (Family Medicine) Theotis Lavelle BRAVO, MD as Consulting Physician (Pulmonary Disease) Maree Jannett POUR, MD as Consulting Physician (Neurology) Bosie Vinie LABOR, MD as Consulting Physician (Cardiology) Lendia Bland, [place] as Physician Assistant (Pain Medicine) Janalyn Keene NOVAK, MD (Inactive) as Consulting Physician (Gastroenterology) [facility], [facility])  I have personally reviewed and noted the following in the patients chart:   Medical and social history Use of alcohol, tobacco or illicit drugs  Current medications and supplements including opioid prescriptions. Functional ability and status Nutritional status Physical activity Advanced directives List of other physicians Hospitalizations, surgeries, and ER visits in previous 12 months Vitals Screenings to include cognitive, depression, and falls Referrals and appointments  No orders of the defined types were placed in this encounter.  In addition, I have reviewed and discussed with patient certain preventive protocols, quality metrics, and best practice recommendations. A written personalized care plan for preventive services as well as general preventive health recommendations were provided to patient.   Jhonnie GORMAN Das, LPN   [id number]/[id number]   Return  in about 1 year (around 03/19/2025).  After Visit Summary: (MyChart) Due to this being a telephonic visit, the after visit summary with patients personalized plan was offered to patient via MyChart   Nurse Notes: UTD ON SHOTS EXCEPT SHINGRIX ; DECLINES MAMMOGRAM; UTD ON COLONOSCOPY  Patient advised to keep follow-up appointment with PCP (04/01/24 @ 3:00 PM FOR PHYSICAL)   "

## 2024-03-19 NOTE — Telephone Encounter (Signed)
 FYI Only or Action Required?: Action required by provider: referral request and update on patient condition.  Patient was last seen in primary care on 02/24/2024 by Glenard Mire, MD.  Called Nurse Triage reporting Wound Infection.    Triage Disposition: See PCP When Office is Open (Within 3 Days)  Patient/caregiver understands and will follow disposition?: No, wishes to speak with PCP     Message from [employer] D sent at 03/19/2024  1:40 PM EST  Therisa with [facility]-  Says pt wound is deteriorating flough in it. Has called before to get pt a referral to see a wound specialist hasn't heard back and says she told the person this the last time she called.   Reason for Disposition  [1] After 14 days AND [2] wound isn't healed  Answer Assessment - Initial Assessment Questions 1. LOCATION: Where is the wound located?       Right shin, right ankle   2. WOUND APPEARANCE: What does the wound look like?       Slough on right shin, granulated tissue noted.       Therisa with [facility] called in to triage with complaints of wound deterioration.  This has been ongoing.  Therisa stated she was wanting a wound clinic referral for BIL wound on legs.  The right lower leg wound has increased in size. The wound is painful, with slough.  Therisa states she has reported this to the office a couple of times on 1/14, and 1/22 and no response.   Please advise.  Protocols used: Wound Infection Suspected-A-AH

## 2024-03-21 IMAGING — CR DG CHEST 2V
2 series · 2 of 2 positions shown · non-contrast
Comparison: 12/04/2020

CLINICAL DATA: Shortness of breath

EXAM:
CHEST - 2 VIEW

[chest pa]
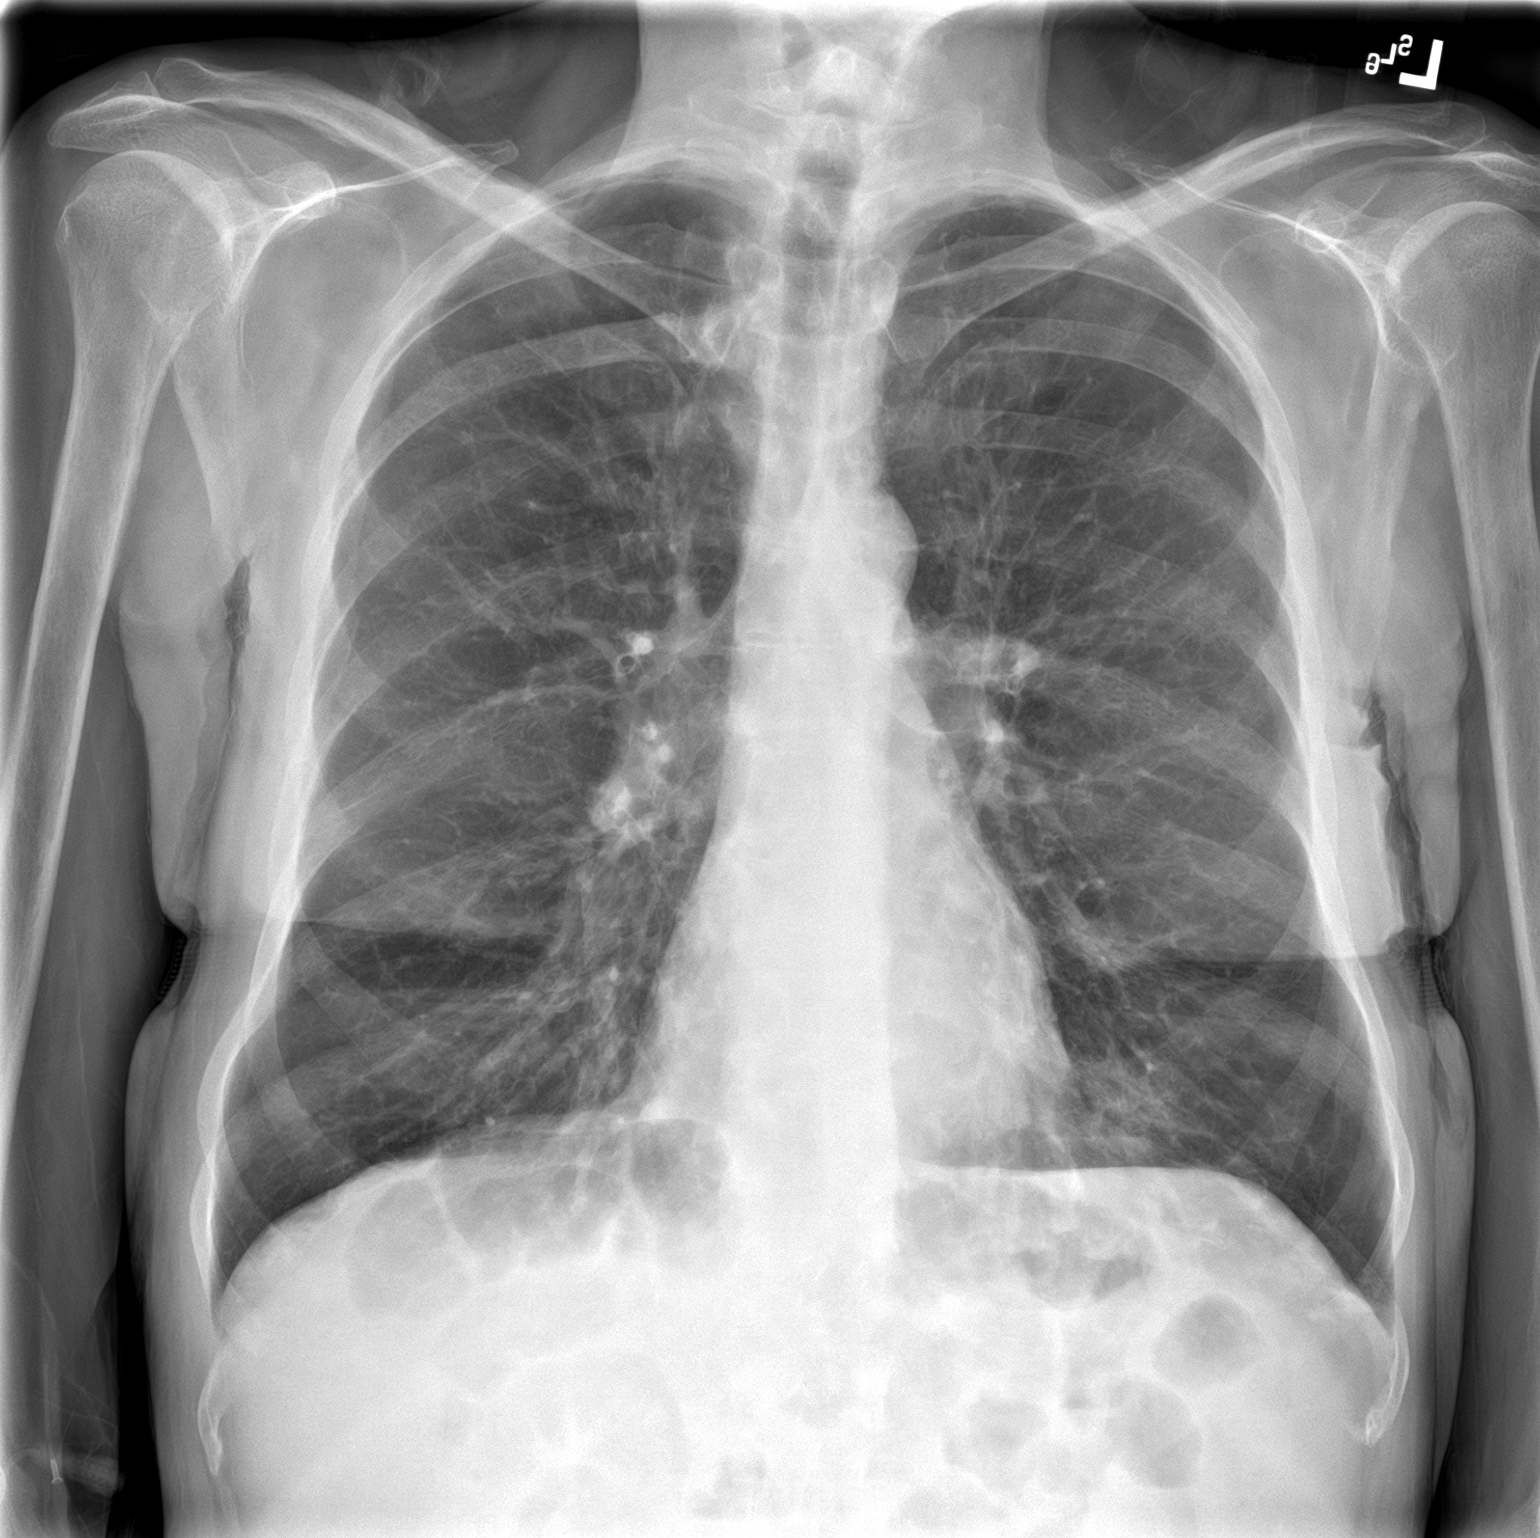

[chest lat]
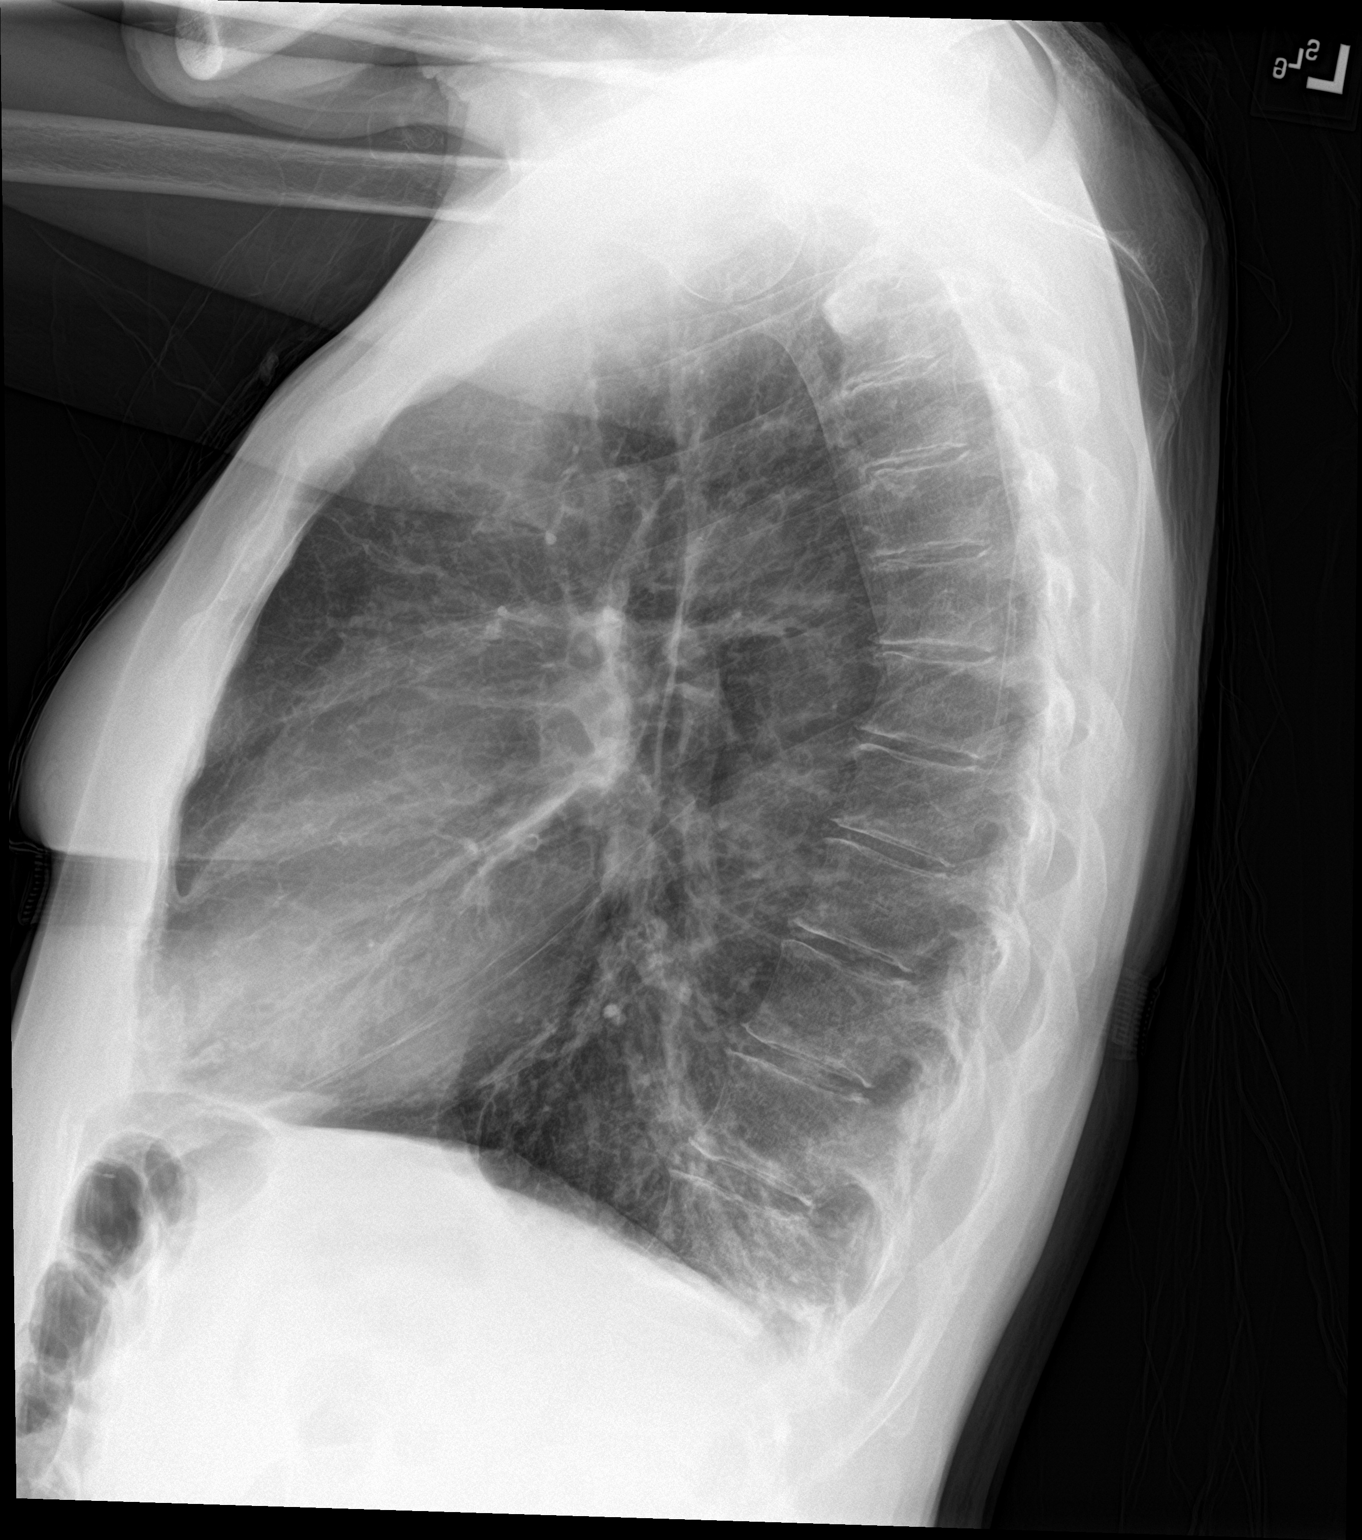

[2 of 2 positions shown; findings below may reference images not displayed]

FINDINGS: The heart size and mediastinal contours are within normal limits.
Hyperinflated lungs. Minimal streaky left basilar opacity. No
pleural effusion or pneumothorax. The visualized skeletal structures
are unremarkable.
IMPRESSION: Minimal streaky left basilar opacity, favor atelectasis.

## 2024-03-21 IMAGING — MR MR LUMBAR SPINE W/O CM
6 of 7 series · 38 of 48 positions shown · non-contrast
Comparison: 07/12/2011

CLINICAL DATA: Back pain and urinary retention. Bilateral leg
weakness and bladder issues.

EXAM:
MRI LUMBAR SPINE WITHOUT CONTRAST
TECHNIQUE: Multiplanar, multisequence MR imaging of the lumbar spine was
performed. No intravenous contrast was administered.

[Series 5: T2 · sagittal · 4.0mm · 0.81mm/px · 5 of 17 slices shown (1 of 4)]
[im 1/17]
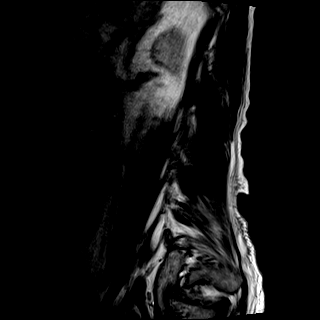
[im 5/17]
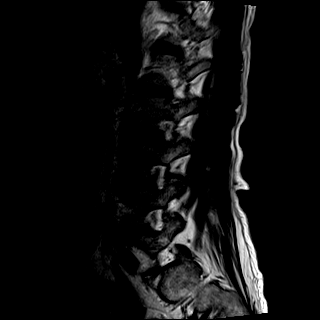
[im 9/17]
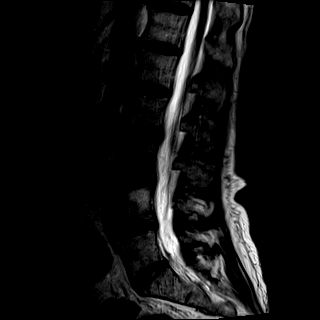
[im 13/17]
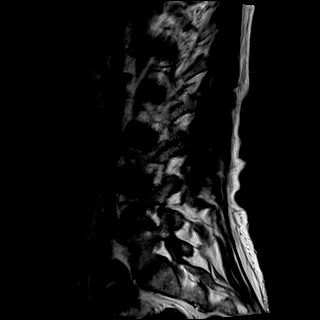
[im 17/17]
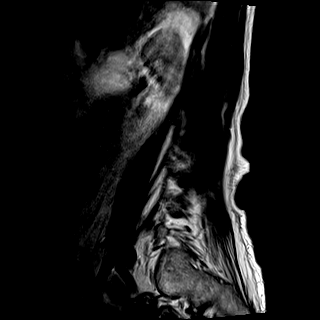

[Series 6: T1 · sagittal · 4.0mm · 0.81mm/px · 5 of 17 slices shown (1 of 2)]
[im 1/17]
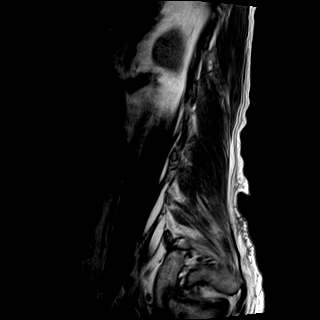
[im 5/17]
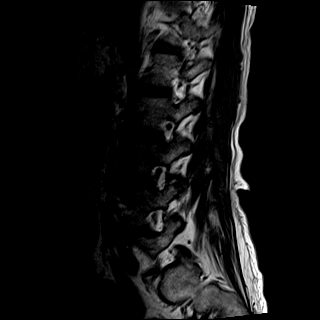
[im 9/17]
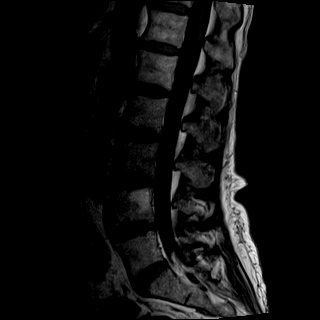
[im 13/17]
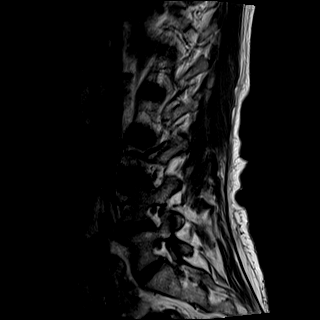
[im 17/17]
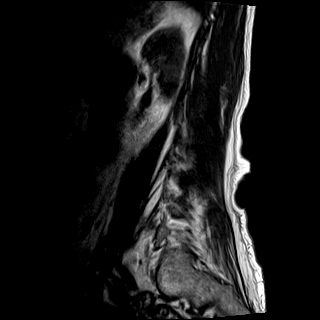

[Series 7: T2 · sagittal · 4.0mm · 1.02mm/px · 4 of 17 slices shown (2 of 4)]
[im 1/17]
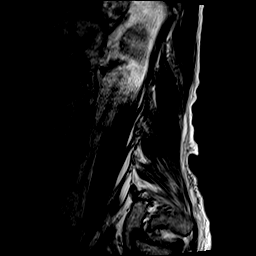
[im 6/17]
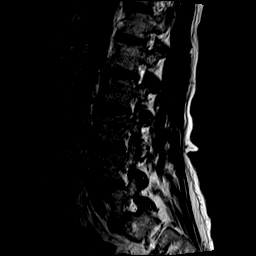
[im 11/17]
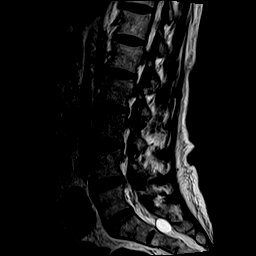
[im 17/17]
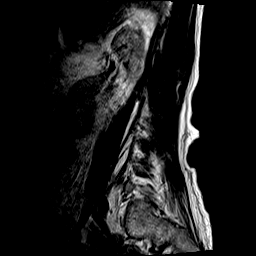

[Series 9: T2 · axial · 4.0mm · 0.78mm/px · z∈[-131,+105]mm · 8 of 39 slices shown (3 of 4)]
[im 1/39]
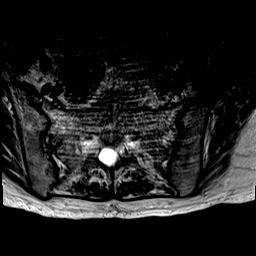
[im 5/39]
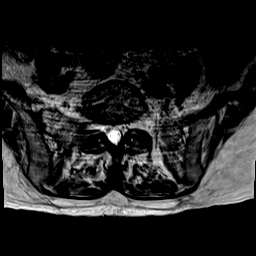
[im 13/39]
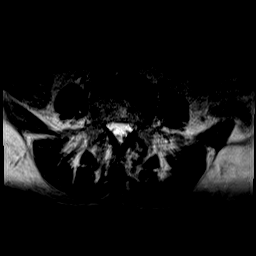
[im 17/39]
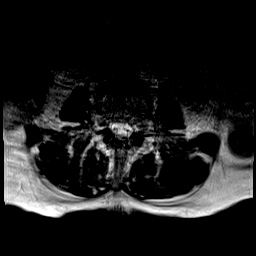
[im 22/39]
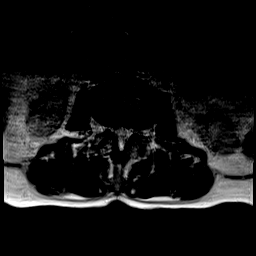
[im 26/39]
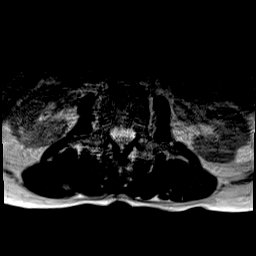
[im 34/39]
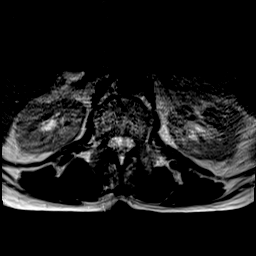
[im 39/39]
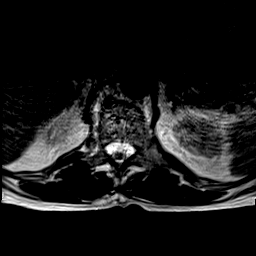

[Series 10: T1 · axial · 4.0mm · 0.39mm/px · z∈[-131,+105]mm · 8 of 39 slices shown (2 of 2)]
[im 1/39]
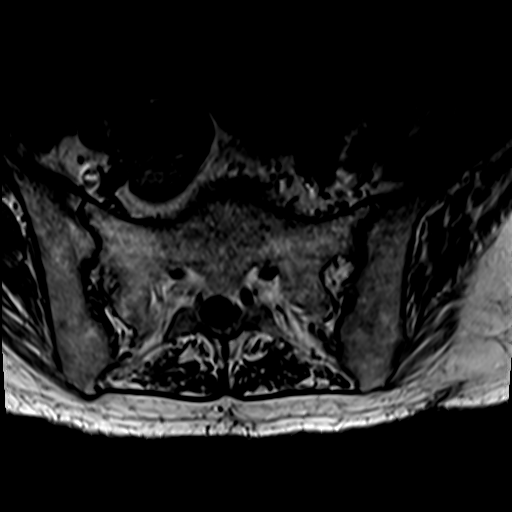
[im 5/39]
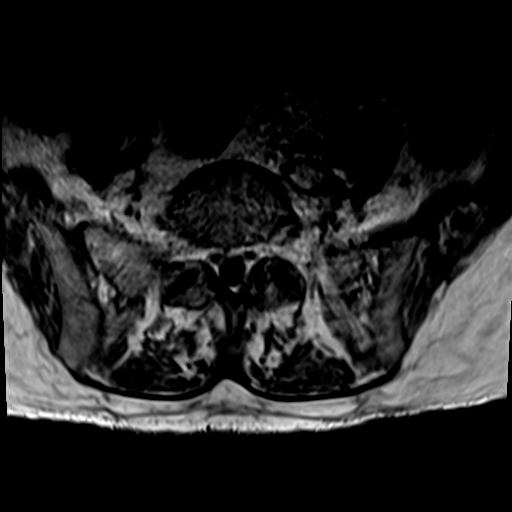
[im 13/39]
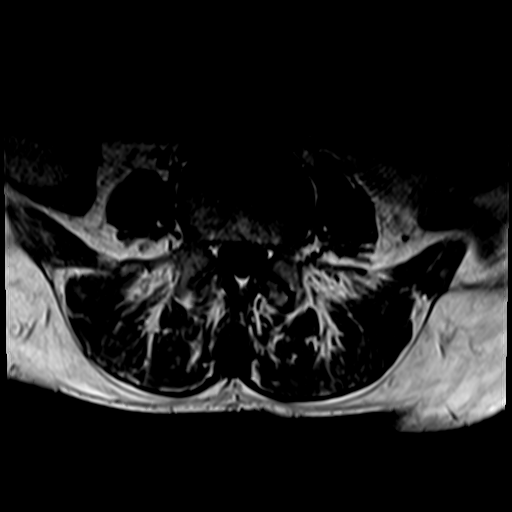
[im 17/39]
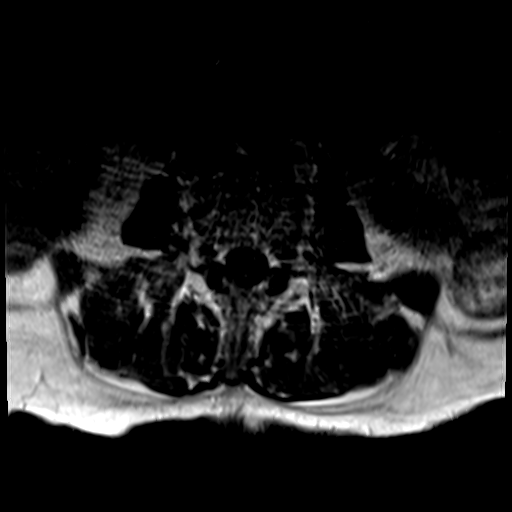
[im 22/39]
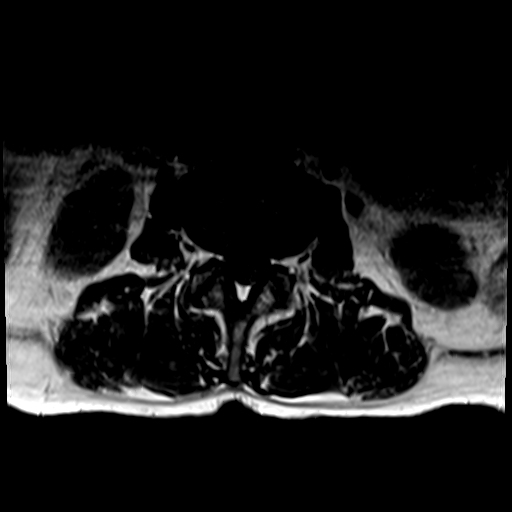
[im 26/39]
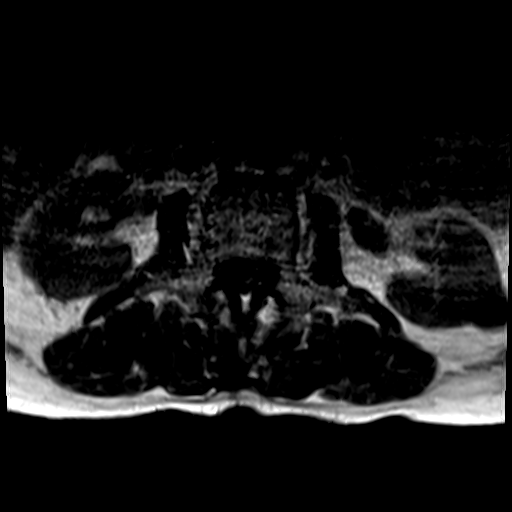
[im 34/39]
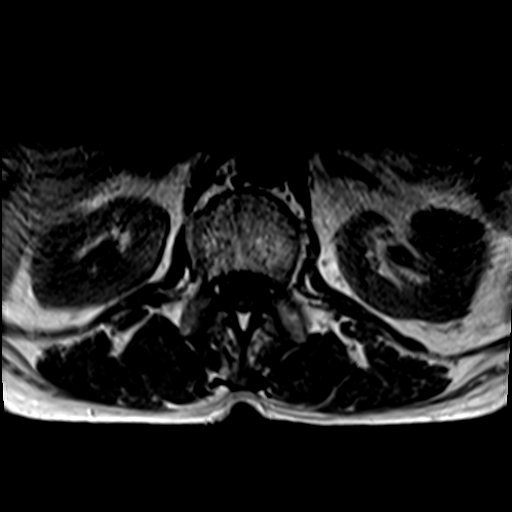
[im 39/39]
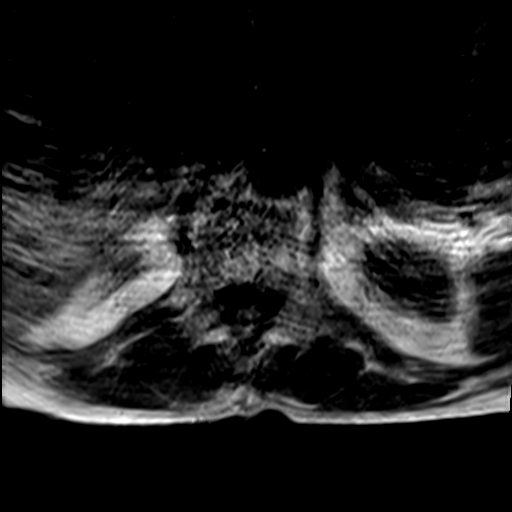

[Series 11: T2 · axial · 4.0mm · 0.78mm/px · z∈[-131,+104]mm · 8 of 39 slices shown (4 of 4)]
[im 1/39]
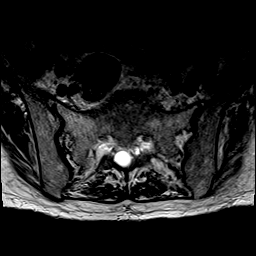
[im 5/39]
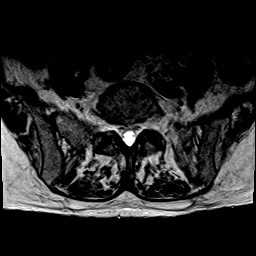
[im 13/39]
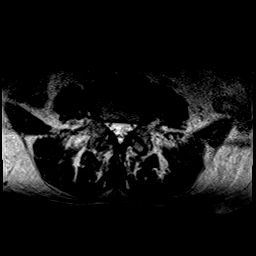
[im 17/39]
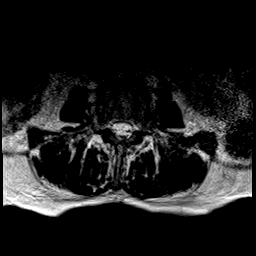
[im 22/39]
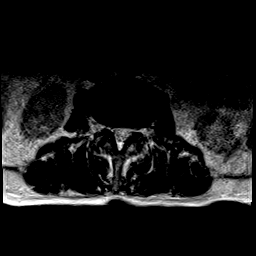
[im 26/39]
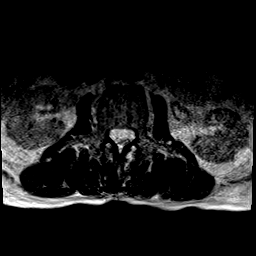
[im 34/39]
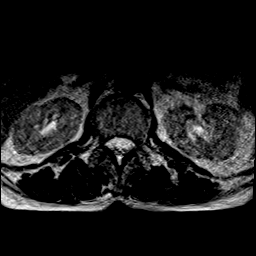
[im 39/39]
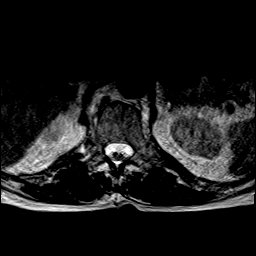

[38 of 48 positions shown; findings below may reference images not displayed]

FINDINGS: Despite efforts by the technologist and patient, motion artifact is
present on today's exam and could not be eliminated. This reduces
exam sensitivity and specificity.

Segmentation: The lowest lumbar type non-rib-bearing vertebra is
labeled as L5.

Alignment:  No vertebral subluxation is observed.

Vertebrae: Hemangioma in the L4 vertebral body. Small Tarlov cyst at
the S2 level. No significant vertebral marrow edema is identified.

Conus medullaris and cauda equina: Conus extends to the T12-L1
level. The fatty filum shown on the prior 2694 exam is obscured by
motion artifact on today's exam. No obvious abnormality of the conus
or cauda equina, subject to the limitations caused by motion
artifact.

Paraspinal and other soft tissues:

A fluid signal intensity lesion of the left kidney is partially
characterized on today's exam. This is statistically likely to be a
cyst but not technically specific.

Disc levels:

T12-L1: Unremarkable.

L1-2: Unremarkable.

L2-3: No impingement.  Mild disc bulge.

L3-4: No impingement.  Mild disc bulge.

L4-5: No impingement.  Mild disc bulge.

L5-S1: Unremarkable.
IMPRESSION: 1. No substantial impingement or specific lesion involving the conus
or cauda equina to directly correlate with the patient's symptoms.
Reduced exam sensitivity due to motion artifact
2. Mild disc bulges at L2-3, L3-4, and L4-5.
3. The fatty filum seen on 07/12/2011 is not well appreciated on
today's exam, presumably due to the degree of motion artifact.

## 2024-03-23 ENCOUNTER — Ambulatory Visit: Admitting: Pain Medicine

## 2024-03-26 ENCOUNTER — Ambulatory Visit
Admission: EM | Admit: 2024-03-26 | Discharge: 2024-03-26 | Disposition: A | Discharge status: Home / Self Care | Source: Home / Self Care

## 2024-03-26 DIAGNOSIS — J439 Emphysema, unspecified: Secondary | ICD-10-CM

## 2024-03-26 DIAGNOSIS — F17200 Nicotine dependence, unspecified, uncomplicated: Secondary | ICD-10-CM | POA: Insufficient documentation

## 2024-03-26 MED ORDER — SULFAMETHOXAZOLE-TRIMETHOPRIM 800-160 MG PO TABS: 1.0000 | ORAL_TABLET | Freq: Two times a day (BID) | ORAL | 0 refills | Status: AC

## 2024-03-26 MED ORDER — DEXAMETHASONE SOD PHOSPHATE PF 10 MG/ML IJ SOLN
10.0000 mg | INTRAMUSCULAR | Status: AC
  Administered 2024-03-26: 10 mg via INTRAMUSCULAR

## 2024-03-26 MED ORDER — IPRATROPIUM-ALBUTEROL 0.5-2.5 (3) MG/3ML IN SOLN
3.0000 mL | RESPIRATORY_TRACT | Status: AC
  Administered 2024-03-26: 3 mL via RESPIRATORY_TRACT

## 2024-03-26 MED ORDER — PREDNISONE 50 MG PO TABS: ORAL_TABLET | ORAL | 0 refills | Status: AC

## 2024-03-26 NOTE — ED Provider Notes (Signed)
 " [facility]    CSN: [id number] Arrival date & time: 03/26/24  1908      History   Chief Complaint Chief Complaint  Patient presents with   Shortness of Breath    HPI Tricia [name] is a 65 y.o. female.   64 year old female, Tricia [name], presents to urgent care for evaluation of shortness of breath,wheezing, productive cough with green mucus x 3 days.  She states she took albuterol  neb prior to arrival.  Patient smokes a little less than a pack a day  Pmh: COPD/Emphysema,HTN,DM, anxiety  The history is provided by the patient. No language interpreter was used.    Past Medical History:  Diagnosis Date   Anxiety    Arthritis    joints and hands/ knees   Asthma    uses inhaler   Benign essential tremor    head   Cervical dystonia    neck pain   Cholesteatoma of left ear    x2   COPD (chronic obstructive pulmonary disease) (HCC)    Cough    Depression    Diabetes mellitus without complication (HCC)    type 2   Diastolic dysfunction    Dyspnea    Dysrhythmia    diastolic dysfunction   GERD (gastroesophageal reflux disease)    Headache    migraines/ one per week   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   Hyperlipidemia    Hypertension    Motion sickness    boat   Neuromuscular disorder (HCC)    neuropathy feet and hands( nerve damage)   Wears dentures    upper and lower    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   Chronic prescription opiate use 09/22/2023   Frequent falls 09/22/2023    Malnutrition of moderate degree 09/02/2023   Anxiety and depression 08/29/2023   Chronic obstructive pulmonary disease (COPD) (HCC) 08/29/2023   GERD without esophagitis 08/29/2023   Diabetes mellitus without complication (HCC) 08/29/2023   CAP (community acquired pneumonia) 09/08/2021   Hypoalbuminemia due to protein-calorie malnutrition 09/08/2021   Lumbar herniated disc 07/11/2021   Calculus of gallbladder without cholecystitis without obstruction 07/11/2021   Senile purpura 06/05/2021   Atherosclerosis of aorta 06/05/2021   Parkinson's disease (HCC) 06/05/2021   Chronic kidney disease (CKD) stage G3a/A2, moderately decreased glomerular filtration rate (GFR) between 45-59 mL/min/1.73 square meter and albuminuria creatinine ratio between 30-299 mg/g (HCC) 06/05/2021   Esophageal dysphagia    Gastric erythema    Columnar-lined esophagus    Moderate malnutrition 09/26/2020   Centrilobular emphysema (HCC) 03/30/2020   History of urinary retention 11/18/2019   GAD (generalized anxiety disorder) 10/13/2019   MDD (major depressive disorder), recurrent episode, moderate (HCC) 10/13/2019   At risk for long QT syndrome 10/13/2019   Nonrheumatic mitral valve regurgitation 05/04/2019   Benign neoplasm of descending colon    Polyp of sigmoid colon    History of Klebsiella UTI 09/11/2023 01/24/2015   Polyneuropathy 10/21/2014   Chronic venous insufficiency 10/05/2014   Bilateral leg edema 08/16/2014   Major depression in partial remission 08/16/2014  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   Asthma, moderate persistent 08/16/2014   Carpal tunnel syndrome 08/16/2014   Cervical pain 08/16/2014   CAFL (chronic airflow limitation) (HCC) 08/16/2014   Type 2 diabetes mellitus with peripheral neuropathy (HCC) 08/16/2014   Diabetes mellitus type 2, insulin  dependent (HCC) 08/16/2014   Dyslipidemia 08/16/2014   Smoker 08/16/2014   Benign neoplasm of stomach 08/16/2014   Gout 08/16/2014   Mixed  hyperlipidemia 08/16/2014   Low back pain 08/16/2014   Lumbar radiculopathy 08/16/2014   Headache, migraine 08/16/2014   Arthralgia of multiple joints 08/16/2014   Vitamin D  deficiency 08/16/2014   Primary osteoarthritis of both knees 06/22/2014   Benign essential tremor 10/02/2013   Cervical dystonia 10/02/2013   Chronic diastolic CHF (congestive heart failure) (HCC) 11/16/2012   Chronic pain 11/13/2012    Past Surgical History:  Procedure Laterality Date   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   Procedure: AMPUTATION, TOE;  Surgeon: Lennie Barter, DPM;  Location: [facility];  Service: Orthopedics/Podiatry;  Laterality: Left;  2nd Toe amputation   CARPAL TUNNEL RELEASE Bilateral    x2 right, 1x on left   COLONOSCOPY     COLONOSCOPY WITH PROPOFOL  N/A 04/25/2017   Procedure: COLONOSCOPY WITH PROPOFOL ;  Surgeon: Jinny Carmine, MD;  Location: [facility];  Service: Endoscopy;  Laterality: N/A;  diabetic-oral med   DILATION AND CURETTAGE OF UTERUS     ESOPHAGOGASTRODUODENOSCOPY (EGD) WITH PROPOFOL  N/A 01/10/2021   Procedure: ESOPHAGOGASTRODUODENOSCOPY (EGD) WITH PROPOFOL ;  Surgeon: Janalyn Keene NOVAK, MD;  Location: [facility];  Service: Endoscopy;  Laterality: N/A;  Diabetic   EXTERNAL EAR SURGERY Left    x2   IRRIGATION AND DEBRIDEMENT FOOT Right 11/14/2023   Procedure: IRRIGATION AND DEBRIDEMENT FOOT;  Surgeon: Lennie Barter, DPM;  Location: [facility];  Service: Orthopedics/Podiatry;  Laterality: Right;   POLYPECTOMY  04/25/2017   Procedure: POLYPECTOMY INTESTINAL;  Surgeon: Jinny Carmine, MD;  Location: [facility];  Service: Endoscopy;;   SPINE SURGERY     herniated disc   TUBAL LIGATION     WOUND DEBRIDEMENT Bilateral 11/14/2023   Procedure: DEBRIDEMENT, WOUND;  Surgeon: Lennie Barter, DPM;  Location: [facility];  Service: Orthopedics/Podiatry;  Laterality: Bilateral;    OB History   No obstetric history on file.      Home Medications    Prior to Admission  medications  Medication Sig Start Date End Date Taking? Authorizing Provider  predniSONE  (DELTASONE ) 50 MG tablet Take 1 tab by mouth daily 03/27/24  Yes Leeasia Secrist, NP  sulfamethoxazole -trimethoprim  (BACTRIM  DS) 800-160 MG tablet Take 1 tablet by mouth 2 (two) times daily for 5 days. 03/26/24 03/31/24 Yes Aimi Essner, NP  acetaminophen  (TYLENOL ) 650 MG suppository Place 1 suppository (650 mg total) rectally every 4 (four) hours as needed for fever (> 101.5). 11/17/23   Josette Ade, MD  ascorbic acid  (VITAMIN C ) 500 MG tablet Take 1 tablet (500 mg total) by mouth 2 (two) times daily. 01/26/24   Awanda City, MD  Blood Glucose Monitoring Suppl DEVI 1 each by Does not apply route as directed. Dispense based on patient and insurance preference. Use up to four times daily as directed. (FOR ICD-10 E10.9, E11.9). 03/12/24   Sowles, Krichna, MD  calcium  carbonate (TUMS - DOSED IN MG ELEMENTAL CALCIUM ) 500 MG chewable tablet Chew 2 tablets (400 mg of elemental calcium  total) by mouth 3 (three) times daily as needed for indigestion or heartburn. 09/30/23   Alexander, Natalie, DO  carbidopa -levodopa  (SINEMET  IR) 25-100 MG  tablet Take 1.5 tablets by mouth. 02/24/24 08/22/24  [provider]  colchicine  0.6 MG tablet Take 1 tablet (0.6 mg total) by mouth daily as needed (at onset of symptoms may repeat a second dose in 2 hours). 03/02/24   Sowles, Krichna, MD  collagenase  (SANTYL ) 250 UNIT/GM ointment Apply topically daily. 01/26/24   Awanda City, MD  cyclobenzaprine  (FLEXERIL ) 10 MG tablet Take 10 mg by mouth 3 (three) times daily. 01/15/24   [provider]  DULoxetine  (CYMBALTA ) 60 MG capsule Take 1 capsule (60 mg total) by mouth daily. 05/01/23   Sowles, Krichna, MD  furosemide  (LASIX ) 40 MG tablet Take 40 mg by mouth daily.    [provider]  gabapentin  (NEURONTIN ) 600 MG tablet Take 600 mg by mouth 2 (two) times daily. 01/15/24   [provider]  Glucose Blood (BLOOD  GLUCOSE TEST STRIPS) STRP 1 each by Does not apply route as directed. Dispense based on patient and insurance preference. Use up to four times daily as directed. (FOR ICD-10 E10.9, E11.9). 03/12/24   Sowles, Krichna, MD  hydrOXYzine  (ATARAX ) 25 MG tablet Take 1 tablet (25 mg total) by mouth 2 (two) times daily as needed. Home med. 01/26/24   Awanda City, MD  ipratropium-albuterol  (DUONEB) 0.5-2.5 (3) MG/3ML SOLN Inhale 3 mLs into the lungs every 6 (six) hours as needed. 06/21/17   [name], Almarie BRAVO, NP  Lancet Device MISC 1 each by Does not apply route as directed. Dispense based on patient and insurance preference. Use up to four times daily as directed. (FOR ICD-10 E10.9, E11.9). 03/12/24   Sowles, Krichna, MD  Lancets MISC 1 each by Does not apply route as directed. Dispense based on patient and insurance preference. Use up to four times daily as directed. (FOR ICD-10 E10.9, E11.9). 03/12/24   Sowles, Krichna, MD  lidocaine  (LIDODERM ) 5 % Place 1 patch onto the skin daily. Remove & Discard patch within 12 hours or as directed by MD 11/17/23   Josette Ade, MD  lipase/protease/amylase (CREON ) 12000-38000 units CPEP capsule Take 1 capsule (12,000 Units total) by mouth 3 (three) times daily before meals. 11/17/23   Josette Ade, MD  montelukast  (SINGULAIR ) 10 MG tablet Take 10 mg by mouth daily. 01/13/24   [provider]  morphine  (MSIR) 15 MG tablet Take 15 mg by mouth 3 (three) times daily as needed. 01/15/24   [provider]  Multiple Vitamin (MULTIVITAMIN WITH MINERALS) TABS tablet Take 1 tablet by mouth daily. 09/28/23   Alexander, Natalie, DO  omeprazole  (PRILOSEC) 40 MG capsule Take 1 capsule (40 mg total) by mouth daily. 05/01/23   Sowles, Krichna, MD  ondansetron  (ZOFRAN -ODT) 8 MG disintegrating tablet Take by mouth. 02/19/24   [provider]  QUEtiapine  (SEROQUEL ) 25 MG tablet Take 1 tablet (25 mg total) by mouth at bedtime. 05/01/23   Sowles, Krichna, MD   rosuvastatin  (CRESTOR ) 5 MG tablet Take 1 tablet (5 mg total) by mouth at bedtime. 05/01/23   Sowles, Krichna, MD  SYMBICORT  160-4.5 MCG/ACT inhaler INHALE 2 PUFFS TWICE A DAY RINSE MOUTH WITH WATER  AFTER EACH USE 07/09/22   Glenard, Krichna, MD  theophylline  (UNIPHYL) 400 MG 24 hr tablet Take 400 mg by mouth daily.    [provider]  tiotropium (SPIRIVA ) 18 MCG inhalation capsule Place 1 capsule into inhaler and inhale daily. pm 08/27/13   Theotis Lavelle BRAVO, MD  VENTOLIN  HFA 108 (90 Base) MCG/ACT inhaler INHALE 1 PUFF BY MOUTH AS NEEDED 04/26/20   Sowles,  Krichna, MD  Vitamin D , Ergocalciferol , (DRISDOL ) 1.25 MG (50000 UNIT) CAPS capsule Take 1 capsule (50,000 Units total) by mouth every 7 (seven) days. 11/21/23   Josette Ade, MD  zinc  sulfate, 50mg  elemental zinc , 220 (50 Zn) MG capsule Take 1 capsule (220 mg total) by mouth daily. 01/27/24   Awanda City, MD    Family History Family History  Problem Relation Age of Onset   Emphysema Mother    Anxiety disorder Mother    Stroke Father    Throat cancer Father    Lung cancer Maternal Grandmother    Lung cancer Maternal Grandfather    Hypertension Daughter    Diabetes Daughter    Multiple sclerosis Daughter    Bipolar disorder Daughter    Cervical cancer Daughter    Bipolar disorder Daughter    Drug abuse Daughter    Lung cancer Maternal Aunt    Lung cancer Maternal Uncle     Social History Social History[1]   Allergies   Augmentin  [amoxicillin -pot clavulanate] and Penicillins   Review of Systems Review of Systems  Constitutional:  Negative for fever.  Respiratory:  Positive for cough, shortness of breath and wheezing.   Cardiovascular:  Negative for chest pain and palpitations.  All other systems reviewed and are negative.    Physical Exam Triage Vital Signs ED Triage Vitals  Encounter Vitals Group     BP      Girls Systolic BP Percentile      Girls Diastolic BP Percentile      Boys Systolic BP Percentile       Boys Diastolic BP Percentile      Pulse      Resp      Temp      Temp src      SpO2      Weight      Height      Head Circumference      Peak Flow      Pain Score      Pain Loc      Pain Education      Exclude from Growth Chart    No data found.  Updated Vital Signs BP (!) 147/72   Pulse (!) 105   Temp 98.3 F (36.8 C)   Resp (!) 23   SpO2 95%   Visual Acuity Right Eye Distance:   Left Eye Distance:   Bilateral Distance:    Right Eye Near:   Left Eye Near:    Bilateral Near:     Physical Exam Vitals and nursing note reviewed.  Constitutional:      General: She is not in acute distress.    Appearance: She is well-developed and well-groomed.  HENT:     Head: Normocephalic.     Right Ear: Tympanic membrane is retracted.     Left Ear: Tympanic membrane is retracted.     Nose: Congestion present.     Mouth/Throat:     Lips: Pink.     Mouth: Mucous membranes are dry.     Pharynx: Oropharynx is clear. Uvula midline.  Eyes:     General: Lids are normal.     Conjunctiva/sclera: Conjunctivae normal.     Pupils: Pupils are equal, round, and reactive to light.  Neck:     Trachea: No tracheal deviation.  Cardiovascular:     Rate and Rhythm: Regular rhythm. Tachycardia present.     Heart sounds: Normal heart sounds. No murmur heard. Pulmonary:     Effort:  Pulmonary effort is normal.     Breath sounds: Wheezing present.     Comments: Bilateral wheezing Abdominal:     General: Bowel sounds are normal.     Palpations: Abdomen is soft.     Tenderness: There is no abdominal tenderness.  Musculoskeletal:        General: Normal range of motion.     Cervical back: Normal range of motion.  Lymphadenopathy:     Cervical: No cervical adenopathy.  Skin:    General: Skin is warm and dry.     Findings: No rash.  Neurological:     General: No focal deficit present.     Mental Status: She is alert and oriented to person, place, and time.     GCS: GCS eye subscore is 4.  GCS verbal subscore is 5. GCS motor subscore is 6.  Psychiatric:        Speech: Speech normal.        Behavior: Behavior normal. Behavior is cooperative.      UC Treatments / Results  Labs (all labs ordered are listed, but only abnormal results are displayed) Labs Reviewed - No data to display  EKG   Radiology No results found.  Procedures Procedures (including critical care time)  Medications Ordered in UC Medications  ipratropium-albuterol  (DUONEB) 0.5-2.5 (3) MG/3ML nebulizer solution 3 mL (3 mLs Nebulization Given 03/26/24 1922)  dexamethasone  (DECADRON ) injection 10 mg (10 mg Intramuscular Given 03/26/24 1922)    Initial Impression / Assessment and Plan / UC Course  I have reviewed the triage vital signs and the nursing notes.  Pertinent labs & imaging results that were available during my care of the patient were reviewed by me and considered in my medical decision making (see chart for details).     Patient received DuoNeb and Decadron  10 mg in office discussed exam findings and plan of care with patient :we are treating you for your COPD/emphysema flare.Take prednisone  and Bactrim  as prescribed Use your home meds as prescribed If you develop chest pain, worsening shortness of breath,palpitations or worsening symptoms call 911 or go to the nearest ER for further evaluation.  Patient verbalized understanding to this provider.  Ddx: COPD exacerbation, smoker, viral illness Final Clinical Impressions(s) / UC Diagnoses   Final diagnoses:  Chronic obstructive pulmonary disease with emphysema, unspecified emphysema type (HCC)  Smoker     Discharge Instructions      We are treating you for your COPD/emphysema flare.Take prednisone  and Bactrim  as prescribed Use your home meds as prescribed If you develop chest pain, worsening shortness of breath,palpitations or worsening symptoms call 911 or go to the nearest ER for further evaluation     ED Prescriptions      Medication Sig Dispense Auth. Provider   predniSONE  (DELTASONE ) 50 MG tablet Take 1 tab by mouth daily 4 tablet Kynedi Profitt, NP   sulfamethoxazole -trimethoprim  (BACTRIM  DS) 800-160 MG tablet Take 1 tablet by mouth 2 (two) times daily for 5 days. 10 tablet Shemekia Patane, Rilla, NP      PDMP not reviewed this encounter.     [1]  Social History Tobacco Use   Smoking status: Every Day    Current packs/day: 1.00    Average packs/day: 1 pack/day for 46.9 years (46.9 ttl pk-yrs)    Types: Cigarettes    Start date: 05/19/1977   Smokeless tobacco: Never  Vaping Use   Vaping status: Former  Substance Use Topics   Alcohol use: No    Alcohol/week: 0.0  standard drinks of alcohol   Drug use: No     Haylei Cobin, Rilla, NP 03/26/24 2030  "

## 2024-03-26 NOTE — ED Triage Notes (Addendum)
 Patient to Urgent Care with complaints of  SHOB/ wheezing/ productive cough with green mucus.   Symptoms x3 days.  Albuterol  neb PTA.

## 2024-03-26 NOTE — Discharge Instructions (Addendum)
 We are treating you for your COPD/emphysema flare.Take prednisone  and Bactrim  as prescribed Use your home meds as prescribed If you develop chest pain, worsening shortness of breath,palpitations or worsening symptoms call 911 or go to the nearest ER for further evaluation

## 2024-04-01 ENCOUNTER — Encounter: Admitting: Family Medicine

## 2024-04-09 ENCOUNTER — Encounter: Admitting: Physician Assistant

## 2024-04-13 ENCOUNTER — Ambulatory Visit: Admitting: Pain Medicine

## 2024-05-01 ENCOUNTER — Encounter: Payer: Self-pay | Admitting: Neurology

## 2025-03-25 ENCOUNTER — Ambulatory Visit
